# Patient Record
Sex: Female | Born: 1955
Health system: Southern US, Community
[De-identification: ages and names within clinical notes are randomized; demographics above are authoritative.]

## PROBLEM LIST (undated history)

## (undated) DIAGNOSIS — F1190 Opioid use, unspecified, uncomplicated: Secondary | ICD-10-CM

## (undated) DIAGNOSIS — M1990 Unspecified osteoarthritis, unspecified site: Secondary | ICD-10-CM

## (undated) DIAGNOSIS — M545 Low back pain, unspecified: Secondary | ICD-10-CM

## (undated) DIAGNOSIS — R51 Headache: Secondary | ICD-10-CM

## (undated) DIAGNOSIS — C73 Malignant neoplasm of thyroid gland: Secondary | ICD-10-CM

## (undated) DIAGNOSIS — J302 Other seasonal allergic rhinitis: Secondary | ICD-10-CM

## (undated) DIAGNOSIS — R102 Pelvic and perineal pain unspecified side: Secondary | ICD-10-CM

## (undated) DIAGNOSIS — T7840XA Allergy, unspecified, initial encounter: Secondary | ICD-10-CM

## (undated) DIAGNOSIS — R350 Frequency of micturition: Secondary | ICD-10-CM

## (undated) DIAGNOSIS — E1140 Type 2 diabetes mellitus with diabetic neuropathy, unspecified: Secondary | ICD-10-CM

## (undated) DIAGNOSIS — H269 Unspecified cataract: Secondary | ICD-10-CM

## (undated) DIAGNOSIS — G4700 Insomnia, unspecified: Secondary | ICD-10-CM

## (undated) DIAGNOSIS — R413 Other amnesia: Secondary | ICD-10-CM

## (undated) DIAGNOSIS — C539 Malignant neoplasm of cervix uteri, unspecified: Secondary | ICD-10-CM

## (undated) DIAGNOSIS — J189 Pneumonia, unspecified organism: Secondary | ICD-10-CM

## (undated) DIAGNOSIS — Z794 Long term (current) use of insulin: Secondary | ICD-10-CM

## (undated) DIAGNOSIS — K7460 Unspecified cirrhosis of liver: Secondary | ICD-10-CM

## (undated) DIAGNOSIS — M48061 Spinal stenosis, lumbar region without neurogenic claudication: Secondary | ICD-10-CM

## (undated) DIAGNOSIS — F329 Major depressive disorder, single episode, unspecified: Secondary | ICD-10-CM

## (undated) DIAGNOSIS — M419 Scoliosis, unspecified: Secondary | ICD-10-CM

## (undated) DIAGNOSIS — M353 Polymyalgia rheumatica: Secondary | ICD-10-CM

## (undated) DIAGNOSIS — A498 Other bacterial infections of unspecified site: Secondary | ICD-10-CM

## (undated) DIAGNOSIS — G4733 Obstructive sleep apnea (adult) (pediatric): Secondary | ICD-10-CM

## (undated) DIAGNOSIS — G894 Chronic pain syndrome: Secondary | ICD-10-CM

## (undated) DIAGNOSIS — I639 Cerebral infarction, unspecified: Secondary | ICD-10-CM

## (undated) DIAGNOSIS — E785 Hyperlipidemia, unspecified: Secondary | ICD-10-CM

## (undated) DIAGNOSIS — R3915 Urgency of urination: Secondary | ICD-10-CM

## (undated) DIAGNOSIS — IMO0001 Reserved for inherently not codable concepts without codable children: Secondary | ICD-10-CM

## (undated) DIAGNOSIS — M797 Fibromyalgia: Secondary | ICD-10-CM

## (undated) DIAGNOSIS — R519 Headache, unspecified: Secondary | ICD-10-CM

## (undated) DIAGNOSIS — Z8673 Personal history of transient ischemic attack (TIA), and cerebral infarction without residual deficits: Secondary | ICD-10-CM

## (undated) DIAGNOSIS — R0902 Hypoxemia: Secondary | ICD-10-CM

## (undated) DIAGNOSIS — E119 Type 2 diabetes mellitus without complications: Secondary | ICD-10-CM

## (undated) DIAGNOSIS — H3530 Unspecified macular degeneration: Secondary | ICD-10-CM

## (undated) DIAGNOSIS — R351 Nocturia: Secondary | ICD-10-CM

## (undated) DIAGNOSIS — J449 Chronic obstructive pulmonary disease, unspecified: Secondary | ICD-10-CM

## (undated) DIAGNOSIS — F32A Depression, unspecified: Secondary | ICD-10-CM

## (undated) DIAGNOSIS — E039 Hypothyroidism, unspecified: Secondary | ICD-10-CM

## (undated) DIAGNOSIS — K760 Fatty (change of) liver, not elsewhere classified: Secondary | ICD-10-CM

## (undated) DIAGNOSIS — N179 Acute kidney failure, unspecified: Secondary | ICD-10-CM

## (undated) DIAGNOSIS — G8929 Other chronic pain: Secondary | ICD-10-CM

## (undated) DIAGNOSIS — F419 Anxiety disorder, unspecified: Secondary | ICD-10-CM

## (undated) DIAGNOSIS — I10 Essential (primary) hypertension: Secondary | ICD-10-CM

## (undated) HISTORY — PX: COLONOSCOPY: SHX174

## (undated) HISTORY — DX: Cerebral infarction, unspecified: I63.9

## (undated) HISTORY — PX: CARDIOVASCULAR STRESS TEST: SHX262

## (undated) HISTORY — DX: Type 2 diabetes mellitus with diabetic neuropathy, unspecified: E11.40

## (undated) HISTORY — DX: Malignant neoplasm of thyroid gland: C73

## (undated) HISTORY — DX: Depression, unspecified: F32.A

## (undated) HISTORY — DX: Unspecified cataract: H26.9

## (undated) HISTORY — DX: Unspecified osteoarthritis, unspecified site: M19.90

## (undated) HISTORY — DX: Allergy, unspecified, initial encounter: T78.40XA

## (undated) HISTORY — DX: Fatty (change of) liver, not elsewhere classified: K76.0

## (undated) HISTORY — DX: Hypoxemia: R09.02

## (undated) HISTORY — DX: Polymyalgia rheumatica: M35.3

## (undated) HISTORY — DX: Unspecified cirrhosis of liver: K74.60

## (undated) HISTORY — PX: REVISION TOTAL KNEE ARTHROPLASTY: SUR1280

## (undated) HISTORY — DX: Scoliosis, unspecified: M41.9

## (undated) HISTORY — DX: Other amnesia: R41.3

## (undated) HISTORY — DX: Anxiety disorder, unspecified: F41.9

## (undated) HISTORY — DX: Chronic obstructive pulmonary disease, unspecified: J44.9

## (undated) HISTORY — DX: Insomnia, unspecified: G47.00

## (undated) HISTORY — DX: Unspecified macular degeneration: H35.30

## (undated) HISTORY — DX: Major depressive disorder, single episode, unspecified: F32.9

---
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
## 1980-08-26 HISTORY — PX: APPENDECTOMY: SHX54

## 1981-08-26 HISTORY — PX: TUBAL LIGATION: SHX77

## 1999-05-15 ENCOUNTER — Other Ambulatory Visit: Admission: RE | Admit: 1999-05-15 | Discharge: 1999-05-15 | Payer: Self-pay | Admitting: Obstetrics & Gynecology

## 1999-05-22 ENCOUNTER — Encounter: Payer: Self-pay | Admitting: Obstetrics & Gynecology

## 1999-05-22 ENCOUNTER — Ambulatory Visit (HOSPITAL_COMMUNITY): Admission: RE | Admit: 1999-05-22 | Discharge: 1999-05-22 | Payer: Self-pay | Admitting: Obstetrics & Gynecology

## 1999-05-23 ENCOUNTER — Ambulatory Visit (HOSPITAL_COMMUNITY): Admission: RE | Admit: 1999-05-23 | Discharge: 1999-05-23 | Payer: Self-pay | Admitting: Obstetrics & Gynecology

## 1999-05-23 ENCOUNTER — Encounter: Payer: Self-pay | Admitting: Obstetrics & Gynecology

## 1999-05-25 ENCOUNTER — Encounter: Payer: Self-pay | Admitting: Obstetrics & Gynecology

## 1999-05-25 ENCOUNTER — Ambulatory Visit (HOSPITAL_COMMUNITY): Admission: RE | Admit: 1999-05-25 | Discharge: 1999-05-25 | Payer: Self-pay | Admitting: Obstetrics & Gynecology

## 1999-06-11 ENCOUNTER — Encounter (INDEPENDENT_AMBULATORY_CARE_PROVIDER_SITE_OTHER): Payer: Self-pay

## 1999-06-11 ENCOUNTER — Other Ambulatory Visit: Admission: RE | Admit: 1999-06-11 | Discharge: 1999-06-11 | Payer: Self-pay | Admitting: Obstetrics & Gynecology

## 1999-07-20 ENCOUNTER — Encounter: Payer: Self-pay | Admitting: Emergency Medicine

## 1999-07-20 ENCOUNTER — Inpatient Hospital Stay (HOSPITAL_COMMUNITY): Admission: EM | Admit: 1999-07-20 | Discharge: 1999-07-21 | Payer: Self-pay | Admitting: Emergency Medicine

## 1999-11-28 ENCOUNTER — Emergency Department (HOSPITAL_COMMUNITY): Admission: EM | Admit: 1999-11-28 | Discharge: 1999-11-28 | Payer: Self-pay | Admitting: Emergency Medicine

## 1999-11-30 ENCOUNTER — Emergency Department (HOSPITAL_COMMUNITY): Admission: EM | Admit: 1999-11-30 | Discharge: 1999-11-30 | Payer: Self-pay | Admitting: *Deleted

## 2000-07-26 ENCOUNTER — Encounter: Payer: Self-pay | Admitting: Emergency Medicine

## 2000-07-26 ENCOUNTER — Emergency Department (HOSPITAL_COMMUNITY): Admission: EM | Admit: 2000-07-26 | Discharge: 2000-07-26 | Payer: Self-pay | Admitting: Emergency Medicine

## 2001-05-04 ENCOUNTER — Encounter (INDEPENDENT_AMBULATORY_CARE_PROVIDER_SITE_OTHER): Payer: Self-pay | Admitting: *Deleted

## 2001-05-04 ENCOUNTER — Encounter: Admission: RE | Admit: 2001-05-04 | Discharge: 2001-05-04 | Payer: Self-pay | Admitting: Family Medicine

## 2002-05-07 ENCOUNTER — Emergency Department (HOSPITAL_COMMUNITY): Admission: EM | Admit: 2002-05-07 | Discharge: 2002-05-07 | Payer: Self-pay | Admitting: *Deleted

## 2002-05-09 ENCOUNTER — Ambulatory Visit (HOSPITAL_COMMUNITY): Admission: RE | Admit: 2002-05-09 | Discharge: 2002-05-09 | Payer: Self-pay | Admitting: *Deleted

## 2002-05-09 ENCOUNTER — Encounter: Payer: Self-pay | Admitting: *Deleted

## 2002-05-27 ENCOUNTER — Emergency Department (HOSPITAL_COMMUNITY): Admission: EM | Admit: 2002-05-27 | Discharge: 2002-05-27 | Payer: Self-pay | Admitting: *Deleted

## 2002-06-04 ENCOUNTER — Encounter: Payer: Self-pay | Admitting: General Surgery

## 2002-06-10 ENCOUNTER — Encounter: Payer: Self-pay | Admitting: General Surgery

## 2002-06-10 ENCOUNTER — Encounter (INDEPENDENT_AMBULATORY_CARE_PROVIDER_SITE_OTHER): Payer: Self-pay | Admitting: Specialist

## 2002-06-10 ENCOUNTER — Observation Stay (HOSPITAL_COMMUNITY): Admission: RE | Admit: 2002-06-10 | Discharge: 2002-06-11 | Payer: Self-pay | Admitting: General Surgery

## 2002-06-10 HISTORY — PX: LAPAROSCOPIC CHOLECYSTECTOMY: SUR755

## 2002-08-02 ENCOUNTER — Ambulatory Visit (HOSPITAL_COMMUNITY): Admission: RE | Admit: 2002-08-02 | Discharge: 2002-08-02 | Payer: Self-pay | Admitting: Internal Medicine

## 2002-08-02 ENCOUNTER — Encounter: Payer: Self-pay | Admitting: Internal Medicine

## 2002-08-24 ENCOUNTER — Encounter: Admission: RE | Admit: 2002-08-24 | Discharge: 2002-11-22 | Payer: Self-pay | Admitting: Internal Medicine

## 2002-08-27 ENCOUNTER — Encounter: Payer: Self-pay | Admitting: Orthopedic Surgery

## 2002-08-27 ENCOUNTER — Encounter: Admission: RE | Admit: 2002-08-27 | Discharge: 2002-08-27 | Payer: Self-pay | Admitting: Internal Medicine

## 2002-08-27 ENCOUNTER — Encounter: Admission: RE | Admit: 2002-08-27 | Discharge: 2002-08-27 | Payer: Self-pay | Admitting: Orthopedic Surgery

## 2002-08-27 ENCOUNTER — Encounter: Payer: Self-pay | Admitting: Internal Medicine

## 2002-09-08 ENCOUNTER — Encounter: Payer: Self-pay | Admitting: Gastroenterology

## 2002-09-08 ENCOUNTER — Ambulatory Visit (HOSPITAL_COMMUNITY): Admission: RE | Admit: 2002-09-08 | Discharge: 2002-09-08 | Payer: Self-pay | Admitting: Gastroenterology

## 2002-09-08 DIAGNOSIS — K219 Gastro-esophageal reflux disease without esophagitis: Secondary | ICD-10-CM | POA: Insufficient documentation

## 2002-11-15 ENCOUNTER — Ambulatory Visit (HOSPITAL_COMMUNITY): Admission: RE | Admit: 2002-11-15 | Discharge: 2002-11-15 | Payer: Self-pay | Admitting: Cardiology

## 2002-11-15 ENCOUNTER — Encounter: Payer: Self-pay | Admitting: Cardiology

## 2002-11-16 ENCOUNTER — Ambulatory Visit: Admission: RE | Admit: 2002-11-16 | Discharge: 2002-11-16 | Payer: Self-pay | Admitting: Internal Medicine

## 2002-11-16 ENCOUNTER — Encounter: Payer: Self-pay | Admitting: Internal Medicine

## 2003-03-09 ENCOUNTER — Ambulatory Visit (HOSPITAL_COMMUNITY): Admission: RE | Admit: 2003-03-09 | Discharge: 2003-03-09 | Payer: Self-pay | Admitting: Internal Medicine

## 2003-03-28 ENCOUNTER — Encounter: Admission: RE | Admit: 2003-03-28 | Discharge: 2003-06-26 | Payer: Self-pay | Admitting: Surgery

## 2003-03-30 ENCOUNTER — Encounter: Payer: Self-pay | Admitting: Surgery

## 2003-03-30 ENCOUNTER — Encounter: Admission: RE | Admit: 2003-03-30 | Discharge: 2003-03-30 | Payer: Self-pay | Admitting: Surgery

## 2003-04-01 ENCOUNTER — Other Ambulatory Visit: Admission: RE | Admit: 2003-04-01 | Discharge: 2003-04-01 | Payer: Self-pay | Admitting: Obstetrics and Gynecology

## 2003-05-06 ENCOUNTER — Encounter: Payer: Self-pay | Admitting: Emergency Medicine

## 2003-05-06 ENCOUNTER — Emergency Department (HOSPITAL_COMMUNITY): Admission: EM | Admit: 2003-05-06 | Discharge: 2003-05-06 | Payer: Self-pay | Admitting: Emergency Medicine

## 2003-05-16 ENCOUNTER — Ambulatory Visit (HOSPITAL_COMMUNITY): Admission: RE | Admit: 2003-05-16 | Discharge: 2003-05-16 | Payer: Self-pay | Admitting: Obstetrics and Gynecology

## 2003-05-16 ENCOUNTER — Encounter (INDEPENDENT_AMBULATORY_CARE_PROVIDER_SITE_OTHER): Payer: Self-pay | Admitting: Specialist

## 2003-05-16 HISTORY — PX: CRYOABLATION: SHX1415

## 2003-09-07 ENCOUNTER — Emergency Department (HOSPITAL_COMMUNITY): Admission: AD | Admit: 2003-09-07 | Discharge: 2003-09-07 | Payer: Self-pay | Admitting: Family Medicine

## 2003-11-09 ENCOUNTER — Encounter: Admission: RE | Admit: 2003-11-09 | Discharge: 2003-11-09 | Payer: Self-pay | Admitting: Obstetrics and Gynecology

## 2004-01-30 ENCOUNTER — Emergency Department (HOSPITAL_COMMUNITY): Admission: EM | Admit: 2004-01-30 | Discharge: 2004-01-31 | Payer: Self-pay | Admitting: Emergency Medicine

## 2004-05-08 ENCOUNTER — Emergency Department (HOSPITAL_COMMUNITY): Admission: EM | Admit: 2004-05-08 | Discharge: 2004-05-08 | Payer: Self-pay | Admitting: Emergency Medicine

## 2004-08-25 ENCOUNTER — Emergency Department (HOSPITAL_COMMUNITY): Admission: EM | Admit: 2004-08-25 | Discharge: 2004-08-25 | Payer: Self-pay | Admitting: Emergency Medicine

## 2004-09-04 ENCOUNTER — Inpatient Hospital Stay (HOSPITAL_BASED_OUTPATIENT_CLINIC_OR_DEPARTMENT_OTHER): Admission: RE | Admit: 2004-09-04 | Discharge: 2004-09-04 | Payer: Self-pay | Admitting: Cardiology

## 2004-09-04 HISTORY — PX: CARDIAC CATHETERIZATION: SHX172

## 2004-12-28 ENCOUNTER — Encounter (INDEPENDENT_AMBULATORY_CARE_PROVIDER_SITE_OTHER): Payer: Self-pay | Admitting: *Deleted

## 2004-12-28 ENCOUNTER — Encounter: Admission: RE | Admit: 2004-12-28 | Discharge: 2004-12-28 | Payer: Self-pay | Admitting: Internal Medicine

## 2005-01-25 ENCOUNTER — Ambulatory Visit (HOSPITAL_COMMUNITY): Admission: RE | Admit: 2005-01-25 | Discharge: 2005-01-25 | Payer: Self-pay | Admitting: General Surgery

## 2005-02-28 ENCOUNTER — Encounter (INDEPENDENT_AMBULATORY_CARE_PROVIDER_SITE_OTHER): Payer: Self-pay | Admitting: *Deleted

## 2005-02-28 ENCOUNTER — Encounter: Admission: RE | Admit: 2005-02-28 | Discharge: 2005-02-28 | Payer: Self-pay | Admitting: General Surgery

## 2005-02-28 ENCOUNTER — Ambulatory Visit (HOSPITAL_COMMUNITY): Admission: RE | Admit: 2005-02-28 | Discharge: 2005-02-28 | Payer: Self-pay | Admitting: General Surgery

## 2005-02-28 HISTORY — PX: BREAST EXCISIONAL BIOPSY: SUR124

## 2005-04-04 ENCOUNTER — Ambulatory Visit (HOSPITAL_BASED_OUTPATIENT_CLINIC_OR_DEPARTMENT_OTHER): Admission: RE | Admit: 2005-04-04 | Discharge: 2005-04-04 | Payer: Self-pay | Admitting: Surgery

## 2005-04-07 ENCOUNTER — Ambulatory Visit: Payer: Self-pay | Admitting: Internal Medicine

## 2005-04-08 ENCOUNTER — Encounter: Admission: RE | Admit: 2005-04-08 | Discharge: 2005-07-07 | Payer: Self-pay | Admitting: Surgery

## 2005-04-09 ENCOUNTER — Ambulatory Visit: Payer: Self-pay | Admitting: Internal Medicine

## 2005-08-02 ENCOUNTER — Emergency Department (HOSPITAL_COMMUNITY): Admission: EM | Admit: 2005-08-02 | Discharge: 2005-08-03 | Payer: Self-pay | Admitting: Emergency Medicine

## 2005-08-30 ENCOUNTER — Encounter: Admission: RE | Admit: 2005-08-30 | Discharge: 2005-08-30 | Payer: Self-pay | Admitting: Internal Medicine

## 2005-09-05 ENCOUNTER — Ambulatory Visit (HOSPITAL_COMMUNITY): Admission: RE | Admit: 2005-09-05 | Discharge: 2005-09-05 | Payer: Self-pay | Admitting: Internal Medicine

## 2005-09-11 ENCOUNTER — Other Ambulatory Visit: Admission: RE | Admit: 2005-09-11 | Discharge: 2005-09-11 | Payer: Self-pay | Admitting: Diagnostic Radiology

## 2005-09-11 ENCOUNTER — Encounter: Admission: RE | Admit: 2005-09-11 | Discharge: 2005-09-11 | Payer: Self-pay | Admitting: Internal Medicine

## 2005-09-11 ENCOUNTER — Encounter (INDEPENDENT_AMBULATORY_CARE_PROVIDER_SITE_OTHER): Payer: Self-pay | Admitting: *Deleted

## 2005-11-22 ENCOUNTER — Ambulatory Visit (HOSPITAL_COMMUNITY): Admission: RE | Admit: 2005-11-22 | Discharge: 2005-11-23 | Payer: Self-pay | Admitting: General Surgery

## 2005-11-22 ENCOUNTER — Encounter (INDEPENDENT_AMBULATORY_CARE_PROVIDER_SITE_OTHER): Payer: Self-pay | Admitting: *Deleted

## 2005-11-22 HISTORY — PX: TOTAL THYROIDECTOMY: SHX2547

## 2006-01-04 ENCOUNTER — Encounter: Admission: RE | Admit: 2006-01-04 | Discharge: 2006-01-04 | Payer: Self-pay | Admitting: Family Medicine

## 2006-01-14 ENCOUNTER — Encounter: Admission: RE | Admit: 2006-01-14 | Discharge: 2006-01-14 | Payer: Self-pay | Admitting: Surgery

## 2006-02-25 ENCOUNTER — Ambulatory Visit (HOSPITAL_COMMUNITY): Admission: RE | Admit: 2006-02-25 | Discharge: 2006-02-26 | Payer: Self-pay | Admitting: Surgery

## 2006-02-25 ENCOUNTER — Encounter (INDEPENDENT_AMBULATORY_CARE_PROVIDER_SITE_OTHER): Payer: Self-pay | Admitting: *Deleted

## 2006-03-01 HISTORY — PX: LAPAROSCOPIC GASTRIC BANDING: SHX1100

## 2006-03-14 ENCOUNTER — Encounter: Admission: RE | Admit: 2006-03-14 | Discharge: 2006-03-14 | Payer: Self-pay | Admitting: Surgery

## 2006-03-20 ENCOUNTER — Encounter: Payer: Self-pay | Admitting: Vascular Surgery

## 2006-03-20 ENCOUNTER — Emergency Department (HOSPITAL_COMMUNITY): Admission: EM | Admit: 2006-03-20 | Discharge: 2006-03-20 | Payer: Self-pay | Admitting: Emergency Medicine

## 2006-03-26 ENCOUNTER — Emergency Department (HOSPITAL_COMMUNITY): Admission: EM | Admit: 2006-03-26 | Discharge: 2006-03-27 | Payer: Self-pay | Admitting: Emergency Medicine

## 2006-03-29 HISTORY — PX: KNEE ARTHROSCOPY W/ DEBRIDEMENT: SHX1867

## 2006-04-08 ENCOUNTER — Ambulatory Visit (HOSPITAL_COMMUNITY): Admission: RE | Admit: 2006-04-08 | Discharge: 2006-04-08 | Payer: Self-pay | Admitting: Orthopaedic Surgery

## 2006-07-09 ENCOUNTER — Encounter: Admission: RE | Admit: 2006-07-09 | Discharge: 2006-07-09 | Payer: Self-pay | Admitting: Family Medicine

## 2006-07-10 ENCOUNTER — Encounter: Admission: RE | Admit: 2006-07-10 | Discharge: 2006-07-10 | Payer: Self-pay | Admitting: Family Medicine

## 2006-08-01 ENCOUNTER — Encounter: Admission: RE | Admit: 2006-08-01 | Discharge: 2006-08-01 | Payer: Self-pay | Admitting: Family Medicine

## 2006-08-13 ENCOUNTER — Encounter: Admission: RE | Admit: 2006-08-13 | Discharge: 2006-08-13 | Payer: Self-pay | Admitting: Internal Medicine

## 2006-09-16 ENCOUNTER — Encounter: Admission: RE | Admit: 2006-09-16 | Discharge: 2006-09-16 | Payer: Self-pay | Admitting: Family Medicine

## 2006-10-05 ENCOUNTER — Emergency Department (HOSPITAL_COMMUNITY): Admission: EM | Admit: 2006-10-05 | Discharge: 2006-10-05 | Payer: Self-pay | Admitting: Family Medicine

## 2006-12-19 ENCOUNTER — Encounter: Admission: RE | Admit: 2006-12-19 | Discharge: 2006-12-19 | Payer: Self-pay | Admitting: Family Medicine

## 2006-12-23 ENCOUNTER — Encounter: Admission: RE | Admit: 2006-12-23 | Discharge: 2006-12-23 | Payer: Self-pay | Admitting: Endocrinology

## 2006-12-30 ENCOUNTER — Encounter: Admission: RE | Admit: 2006-12-30 | Discharge: 2006-12-30 | Payer: Self-pay | Admitting: Family Medicine

## 2007-01-15 ENCOUNTER — Encounter: Admission: RE | Admit: 2007-01-15 | Discharge: 2007-01-15 | Payer: Self-pay | Admitting: Radiology

## 2007-01-23 ENCOUNTER — Inpatient Hospital Stay (HOSPITAL_COMMUNITY): Admission: RE | Admit: 2007-01-23 | Discharge: 2007-01-27 | Payer: Self-pay | Admitting: Orthopaedic Surgery

## 2007-01-23 HISTORY — PX: TOTAL KNEE ARTHROPLASTY: SHX125

## 2007-01-27 ENCOUNTER — Ambulatory Visit: Payer: Self-pay | Admitting: Vascular Surgery

## 2007-02-23 ENCOUNTER — Encounter: Admission: RE | Admit: 2007-02-23 | Discharge: 2007-02-23 | Payer: Self-pay | Admitting: Family Medicine

## 2007-03-04 ENCOUNTER — Encounter: Admission: RE | Admit: 2007-03-04 | Discharge: 2007-03-04 | Payer: Self-pay | Admitting: Family Medicine

## 2007-05-05 ENCOUNTER — Encounter: Admission: RE | Admit: 2007-05-05 | Discharge: 2007-05-05 | Payer: Self-pay | Admitting: Family Medicine

## 2007-05-11 ENCOUNTER — Encounter
Admission: RE | Admit: 2007-05-11 | Discharge: 2007-08-09 | Payer: Self-pay | Admitting: Physical Medicine & Rehabilitation

## 2007-05-12 ENCOUNTER — Ambulatory Visit: Payer: Self-pay | Admitting: Physical Medicine & Rehabilitation

## 2007-05-19 ENCOUNTER — Encounter
Admission: RE | Admit: 2007-05-19 | Discharge: 2007-08-17 | Payer: Self-pay | Admitting: Physical Medicine & Rehabilitation

## 2007-05-26 ENCOUNTER — Encounter
Admission: RE | Admit: 2007-05-26 | Discharge: 2007-05-26 | Payer: Self-pay | Admitting: Physical Medicine & Rehabilitation

## 2007-07-17 ENCOUNTER — Ambulatory Visit: Payer: Self-pay | Admitting: Psychology

## 2007-07-17 ENCOUNTER — Ambulatory Visit: Payer: Self-pay | Admitting: Physical Medicine & Rehabilitation

## 2007-07-21 ENCOUNTER — Ambulatory Visit: Payer: Self-pay | Admitting: Gastroenterology

## 2007-07-21 LAB — CONVERTED CEMR LAB
Amylase: 27 units/L (ref 27–131)
Ferritin: 54.3 ng/mL (ref 10.0–291.0)
INR: 0.9 (ref 0.8–1.0)
Lipase: 17 units/L (ref 11.0–59.0)
Saturation Ratios: 10.1 % — ABNORMAL LOW (ref 20.0–50.0)

## 2007-07-24 ENCOUNTER — Ambulatory Visit: Payer: Self-pay | Admitting: Internal Medicine

## 2007-07-31 ENCOUNTER — Emergency Department (HOSPITAL_COMMUNITY): Admission: EM | Admit: 2007-07-31 | Discharge: 2007-07-31 | Payer: Self-pay | Admitting: Emergency Medicine

## 2007-07-31 ENCOUNTER — Ambulatory Visit: Payer: Self-pay | Admitting: Gastroenterology

## 2007-08-05 ENCOUNTER — Ambulatory Visit: Payer: Self-pay | Admitting: Gastroenterology

## 2007-08-07 ENCOUNTER — Encounter
Admission: RE | Admit: 2007-08-07 | Discharge: 2007-08-10 | Payer: Self-pay | Admitting: Physical Medicine & Rehabilitation

## 2007-08-12 ENCOUNTER — Ambulatory Visit: Payer: Self-pay | Admitting: Gastroenterology

## 2007-08-18 ENCOUNTER — Ambulatory Visit: Payer: Self-pay | Admitting: Gastroenterology

## 2007-08-19 ENCOUNTER — Ambulatory Visit (HOSPITAL_COMMUNITY): Admission: RE | Admit: 2007-08-19 | Discharge: 2007-08-19 | Payer: Self-pay | Admitting: Obstetrics and Gynecology

## 2007-08-19 ENCOUNTER — Encounter (INDEPENDENT_AMBULATORY_CARE_PROVIDER_SITE_OTHER): Payer: Self-pay | Admitting: Obstetrics and Gynecology

## 2007-08-19 HISTORY — PX: HYSTEROSCOPY WITH D & C: SHX1775

## 2007-08-25 DIAGNOSIS — F411 Generalized anxiety disorder: Secondary | ICD-10-CM | POA: Insufficient documentation

## 2007-08-25 DIAGNOSIS — R609 Edema, unspecified: Secondary | ICD-10-CM | POA: Insufficient documentation

## 2007-08-25 DIAGNOSIS — F32A Depression, unspecified: Secondary | ICD-10-CM | POA: Insufficient documentation

## 2007-08-25 DIAGNOSIS — I10 Essential (primary) hypertension: Secondary | ICD-10-CM | POA: Insufficient documentation

## 2007-08-25 DIAGNOSIS — M353 Polymyalgia rheumatica: Secondary | ICD-10-CM | POA: Insufficient documentation

## 2007-08-25 DIAGNOSIS — M79609 Pain in unspecified limb: Secondary | ICD-10-CM | POA: Insufficient documentation

## 2007-08-25 DIAGNOSIS — F329 Major depressive disorder, single episode, unspecified: Secondary | ICD-10-CM

## 2007-08-25 DIAGNOSIS — E079 Disorder of thyroid, unspecified: Secondary | ICD-10-CM | POA: Insufficient documentation

## 2007-08-25 DIAGNOSIS — J45909 Unspecified asthma, uncomplicated: Secondary | ICD-10-CM | POA: Insufficient documentation

## 2007-08-25 DIAGNOSIS — E7800 Pure hypercholesterolemia, unspecified: Secondary | ICD-10-CM | POA: Insufficient documentation

## 2007-08-25 DIAGNOSIS — K5900 Constipation, unspecified: Secondary | ICD-10-CM | POA: Insufficient documentation

## 2007-08-25 DIAGNOSIS — E1159 Type 2 diabetes mellitus with other circulatory complications: Secondary | ICD-10-CM | POA: Insufficient documentation

## 2007-08-25 DIAGNOSIS — G4730 Sleep apnea, unspecified: Secondary | ICD-10-CM | POA: Insufficient documentation

## 2007-09-04 ENCOUNTER — Encounter
Admission: RE | Admit: 2007-09-04 | Discharge: 2007-12-03 | Payer: Self-pay | Admitting: Physical Medicine & Rehabilitation

## 2007-09-04 ENCOUNTER — Ambulatory Visit: Payer: Self-pay | Admitting: Physical Medicine & Rehabilitation

## 2007-09-07 ENCOUNTER — Ambulatory Visit: Payer: Self-pay | Admitting: Internal Medicine

## 2007-09-07 DIAGNOSIS — F17200 Nicotine dependence, unspecified, uncomplicated: Secondary | ICD-10-CM | POA: Insufficient documentation

## 2007-09-14 ENCOUNTER — Encounter: Payer: Self-pay | Admitting: Internal Medicine

## 2007-09-14 DIAGNOSIS — E785 Hyperlipidemia, unspecified: Secondary | ICD-10-CM | POA: Insufficient documentation

## 2007-09-14 DIAGNOSIS — E139 Other specified diabetes mellitus without complications: Secondary | ICD-10-CM | POA: Insufficient documentation

## 2007-09-16 ENCOUNTER — Telehealth (INDEPENDENT_AMBULATORY_CARE_PROVIDER_SITE_OTHER): Payer: Self-pay | Admitting: *Deleted

## 2007-09-18 ENCOUNTER — Ambulatory Visit: Payer: Self-pay | Admitting: Gastroenterology

## 2007-10-07 ENCOUNTER — Ambulatory Visit: Payer: Self-pay | Admitting: Physical Medicine & Rehabilitation

## 2007-10-12 ENCOUNTER — Ambulatory Visit: Payer: Self-pay | Admitting: Gastroenterology

## 2007-10-14 DIAGNOSIS — G5600 Carpal tunnel syndrome, unspecified upper limb: Secondary | ICD-10-CM | POA: Insufficient documentation

## 2007-10-14 DIAGNOSIS — G894 Chronic pain syndrome: Secondary | ICD-10-CM | POA: Insufficient documentation

## 2007-10-14 DIAGNOSIS — M129 Arthropathy, unspecified: Secondary | ICD-10-CM | POA: Insufficient documentation

## 2007-10-14 DIAGNOSIS — E669 Obesity, unspecified: Secondary | ICD-10-CM | POA: Insufficient documentation

## 2007-10-14 DIAGNOSIS — E039 Hypothyroidism, unspecified: Secondary | ICD-10-CM | POA: Insufficient documentation

## 2007-10-14 DIAGNOSIS — Z8719 Personal history of other diseases of the digestive system: Secondary | ICD-10-CM | POA: Insufficient documentation

## 2007-10-14 DIAGNOSIS — G609 Hereditary and idiopathic neuropathy, unspecified: Secondary | ICD-10-CM | POA: Insufficient documentation

## 2007-10-14 DIAGNOSIS — J309 Allergic rhinitis, unspecified: Secondary | ICD-10-CM | POA: Insufficient documentation

## 2007-10-19 ENCOUNTER — Ambulatory Visit: Payer: Self-pay | Admitting: Internal Medicine

## 2007-10-21 ENCOUNTER — Ambulatory Visit: Payer: Self-pay | Admitting: Physical Medicine & Rehabilitation

## 2007-10-22 DIAGNOSIS — G4700 Insomnia, unspecified: Secondary | ICD-10-CM | POA: Insufficient documentation

## 2007-10-22 DIAGNOSIS — M545 Low back pain, unspecified: Secondary | ICD-10-CM | POA: Insufficient documentation

## 2007-10-22 DIAGNOSIS — E559 Vitamin D deficiency, unspecified: Secondary | ICD-10-CM | POA: Insufficient documentation

## 2007-11-17 ENCOUNTER — Encounter
Admission: RE | Admit: 2007-11-17 | Discharge: 2008-02-15 | Payer: Self-pay | Admitting: Physical Medicine & Rehabilitation

## 2007-11-20 ENCOUNTER — Ambulatory Visit: Payer: Self-pay | Admitting: Gastroenterology

## 2007-11-26 ENCOUNTER — Ambulatory Visit (HOSPITAL_COMMUNITY): Admission: RE | Admit: 2007-11-26 | Discharge: 2007-11-26 | Payer: Self-pay | Admitting: Gastroenterology

## 2007-11-26 ENCOUNTER — Encounter: Payer: Self-pay | Admitting: Gastroenterology

## 2007-12-17 ENCOUNTER — Ambulatory Visit: Payer: Self-pay | Admitting: Physical Medicine & Rehabilitation

## 2008-01-14 ENCOUNTER — Ambulatory Visit: Payer: Self-pay | Admitting: Physical Medicine & Rehabilitation

## 2008-01-21 ENCOUNTER — Emergency Department (HOSPITAL_COMMUNITY): Admission: EM | Admit: 2008-01-21 | Discharge: 2008-01-21 | Payer: Self-pay | Admitting: Family Medicine

## 2008-02-09 ENCOUNTER — Encounter
Admission: RE | Admit: 2008-02-09 | Discharge: 2008-03-10 | Payer: Self-pay | Admitting: Physical Medicine & Rehabilitation

## 2008-02-11 ENCOUNTER — Ambulatory Visit: Payer: Self-pay | Admitting: Physical Medicine & Rehabilitation

## 2008-02-23 ENCOUNTER — Emergency Department (HOSPITAL_COMMUNITY): Admission: EM | Admit: 2008-02-23 | Discharge: 2008-02-24 | Payer: Self-pay | Admitting: Emergency Medicine

## 2008-03-10 ENCOUNTER — Ambulatory Visit: Payer: Self-pay | Admitting: Physical Medicine & Rehabilitation

## 2008-06-03 ENCOUNTER — Ambulatory Visit (HOSPITAL_COMMUNITY): Admission: RE | Admit: 2008-06-03 | Discharge: 2008-06-03 | Payer: Self-pay | Admitting: Orthopaedic Surgery

## 2008-08-29 ENCOUNTER — Inpatient Hospital Stay (HOSPITAL_COMMUNITY): Admission: RE | Admit: 2008-08-29 | Discharge: 2008-08-31 | Payer: Self-pay | Admitting: Orthopaedic Surgery

## 2008-11-03 ENCOUNTER — Encounter: Admission: RE | Admit: 2008-11-03 | Discharge: 2008-11-03 | Payer: Self-pay | Admitting: Orthopaedic Surgery

## 2008-11-26 ENCOUNTER — Emergency Department (HOSPITAL_COMMUNITY): Admission: EM | Admit: 2008-11-26 | Discharge: 2008-11-26 | Payer: Self-pay | Admitting: Family Medicine

## 2008-12-07 ENCOUNTER — Ambulatory Visit (HOSPITAL_COMMUNITY): Admission: RE | Admit: 2008-12-07 | Discharge: 2008-12-07 | Payer: Self-pay | Admitting: Orthopaedic Surgery

## 2009-02-03 ENCOUNTER — Encounter: Admission: RE | Admit: 2009-02-03 | Discharge: 2009-02-03 | Payer: Self-pay | Admitting: Obstetrics and Gynecology

## 2009-02-08 ENCOUNTER — Encounter: Admission: RE | Admit: 2009-02-08 | Discharge: 2009-02-08 | Payer: Self-pay | Admitting: Obstetrics and Gynecology

## 2009-02-18 ENCOUNTER — Emergency Department (HOSPITAL_COMMUNITY): Admission: EM | Admit: 2009-02-18 | Discharge: 2009-02-18 | Payer: Self-pay | Admitting: Emergency Medicine

## 2009-07-24 ENCOUNTER — Emergency Department (HOSPITAL_COMMUNITY): Admission: EM | Admit: 2009-07-24 | Discharge: 2009-07-24 | Payer: Self-pay | Admitting: Emergency Medicine

## 2009-08-23 ENCOUNTER — Emergency Department (HOSPITAL_COMMUNITY): Admission: EM | Admit: 2009-08-23 | Discharge: 2009-08-23 | Payer: Self-pay | Admitting: Family Medicine

## 2009-09-03 ENCOUNTER — Encounter: Admission: RE | Admit: 2009-09-03 | Discharge: 2009-09-03 | Payer: Self-pay | Admitting: Hematology & Oncology

## 2009-09-08 ENCOUNTER — Encounter (HOSPITAL_COMMUNITY): Admission: RE | Admit: 2009-09-08 | Discharge: 2009-10-30 | Payer: Self-pay | Admitting: Family Medicine

## 2009-09-18 ENCOUNTER — Encounter (HOSPITAL_COMMUNITY): Admission: RE | Admit: 2009-09-18 | Discharge: 2009-12-01 | Payer: Self-pay | Admitting: Family Medicine

## 2010-03-15 ENCOUNTER — Emergency Department (HOSPITAL_COMMUNITY): Admission: EM | Admit: 2010-03-15 | Discharge: 2010-03-16 | Payer: Self-pay | Admitting: Emergency Medicine

## 2010-05-18 ENCOUNTER — Encounter: Admission: RE | Admit: 2010-05-18 | Discharge: 2010-05-18 | Payer: Self-pay | Admitting: Internal Medicine

## 2010-09-16 ENCOUNTER — Encounter: Payer: Self-pay | Admitting: Surgery

## 2010-09-16 ENCOUNTER — Encounter: Payer: Self-pay | Admitting: Orthopaedic Surgery

## 2010-09-16 ENCOUNTER — Encounter: Payer: Self-pay | Admitting: Family Medicine

## 2010-09-21 LAB — CBC
HCT: 42.7 % (ref 36.0–46.0)
MCHC: 31.6 g/dL (ref 30.0–36.0)
MCV: 82.6 fL (ref 78.0–100.0)
Platelets: 181 10*3/uL (ref 150–400)
RDW: 14.9 % (ref 11.5–15.5)

## 2010-09-21 LAB — BASIC METABOLIC PANEL
BUN: 13 mg/dL (ref 6–23)
Calcium: 9.5 mg/dL (ref 8.4–10.5)
GFR calc non Af Amer: >60 mL/min (ref >60)
Glucose, Bld: 97 mg/dL (ref 70–99)
Sodium: 137 mEq/L (ref 135–145)

## 2010-09-28 ENCOUNTER — Observation Stay (HOSPITAL_COMMUNITY)
Admission: RE | Admit: 2010-09-28 | Discharge: 2010-09-29 | Disposition: A | Discharge status: Home / Self Care | Payer: Medicare Other | Source: Ambulatory Visit | Attending: Obstetrics and Gynecology | Admitting: Obstetrics and Gynecology

## 2010-09-28 DIAGNOSIS — N393 Stress incontinence (female) (male): Principal | ICD-10-CM | POA: Insufficient documentation

## 2010-09-28 HISTORY — PX: OTHER SURGICAL HISTORY: SHX169

## 2010-09-29 LAB — CBC
HCT: 38 % (ref 36.0–46.0)
Hemoglobin: 11.7 g/dL — ABNORMAL LOW (ref 12.0–15.0)
MCH: 26.6 pg (ref 26.0–34.0)
MCHC: 30.8 g/dL (ref 30.0–36.0)
MCV: 86.4 fL (ref 78.0–100.0)

## 2010-10-08 NOTE — Op Note (Signed)
NAME:  Mary Acosta, Mary Acosta         ACCOUNT NO.:  [id number]  MEDICAL RECORD NO.:  [id number]           PATIENT TYPE:  O  LOCATION:  [facility]                          [facility]  PHYSICIAN:  Osborn Coho, M.D.   DATE OF BIRTH:  03/22/1956  DATE OF PROCEDURE:  09/28/2010 DATE OF DISCHARGE:                              OPERATIVE REPORT   PREOPERATIVE DIAGNOSIS:  Mixed urinary incontinence.  POSTOPERATIVE DIAGNOSIS:  Mixed urinary incontinence.  PROCEDURE: 1. TVT. 2. Cystoscopy.  ATTENDING DOCTOR:  Osborn Coho, M.D.  ASSISTANT:  Marquis Lunch. Adline Peals.  ANESTHESIA:  General.  FLUIDS:  1300 mL.  URINE OUTPUT:  150 mL.  ESTIMATED BLOOD LOSS:  Minimal.  COMPLICATIONS:  None.  PROCEDURE:  The patient was taken to the operating room after risks, benefits, and alternatives discussed with the patient.  The patient verbalized understanding, consent signed and witnessed.  The patient was placed under general anesthesia and prepped and draped in the normal sterile fashion in the dorsal lithotomy position.  A weighted speculum was placed in the patient's vagina and dilute Pitressin of 20 units of Pitressin in 100 mL normal saline was injected to the anterior wall for a total of approximately 10 mL.  An incision was made in the anterior vaginal wall at the level of the mid urethra for approximately 1 cm and the underlying tissue was dissected away bilaterally to the lower edge of the symphysis pubis bilaterally.  Attention was then turned to the mons pubis where approximately 2 fingerbreadths from the midline bilaterally 5-mm incision were made.  After draining the bladder completely, the 18-French Foley with the rigid urethral catheter guide was placed in the patient's urethra and deflected to the patient's right and the transabdominal guide was passed from the mons pubis out through the anterior vaginal wall incision through the space of Retzius without difficulty.  The same  was done on the contralateral side.  Cystoscopy was performed and no inadvertent bladder injury was noted.  After emptying the bladder once again and deflecting the rigid urethral catheter guide to the patient's right, the mesh was attached to the transabdominal guides and while deflecting the guide to the right, the mesh was elevated up through the incision on the anterior vaginal wall through the space of Retzius and out through the incision on the mons pubis.  The same was done on the contralateral side.  Cystoscopy was performed and once again no inadvertent bladder injury was noted.  The bladder was drained completely and the Foley was placed to gravity and a large Tresa Endo was placed between the midurethra and the mesh and the sheath was removed bilaterally.  The anterior vaginal wall incision was repaired with 2-0 Vicryl via one figure-of-eight stitch and the remainder were interrupted stitches.  Cystoscopy was performed once again and after administering indigo carmine, bilateral ureters were noted to efflux without difficulty.  The Foley was left to gravity.  The mesh was cut flush with the incisions on the mons pubis and were repaired with Dermabond.  Sponge, lap, and needle count was correct. The patient's vagina was packed with 2-inch packing soaked with estrogen cream.  Sponge, lap, and needle count was correct.  The patient tolerated the procedure well and was awaiting transfer to the recovery room in good condition.     Osborn Coho, M.D.     AR/MEDQ  D:  09/28/2010  T:  09/29/2010  Job:  [id number]  Electronically Signed by Osborn Coho M.D. on 10/08/2010 04:31:42 PM

## 2010-10-08 NOTE — H&P (Signed)
NAME:  Mary Acosta, Mary Acosta NO.:  [id number]  MEDICAL RECORD NO.:  [id number]         PATIENT TYPE:  WAMB  LOCATION:                                [facility]  PHYSICIAN:  Osborn Coho, M.D.   DATE OF BIRTH:  [birth year]  DATE OF ADMISSION:  09/28/2010 DATE OF DISCHARGE:                             HISTORY & PHYSICAL   HISTORY OF PRESENT ILLNESS:  [name] is a 55 year old married white female para 2-0-0-2 presenting for placement of tension-free vaginal tape and cystoscopy because of stress urinary incontinence.  For many years, the patient has had bladder issues to include overactive bladder, urge, and stress urinary incontinence.  The patient was diagnosed with detrusor instability in 2004 days as well as a small cystocele.  The patient has been given Toviaz 8 mg to manage her overactive bladder, however, she still reports leaking of urine whenever there is increased abdominal pressure.  The patient states that when she blows her nose, clears her throat, cough, sneezes, lift or occasional any sudden movement, she may leak urine from her bladder.  She denies any vaginitis symptoms, any nausea, vomiting, diarrhea, fever, pelvic pain, back pain, or hematuria.  A review of management options were given to patient (both medical and surgical), however, she desires to proceed with surgical management in the form of placement of tension- free vaginal tape.  OBSTETRIC HISTORY:  Gravida 2, para 2-0-0-2.  The patient had two spontaneous vaginal births.  GYNECOLOGIC HISTORY:  Menarche at 55 years old.  The patient is menopausal.  She has a history of human papillomavirus.  She had an abnormal Pap smear in 2004, for which she underwent a LEEP procedure. Pap smears have been normal since that time with her most recent being in March of 2010 (January 2012 Pap smear is pending).  PAST MEDICAL HISTORY:  Polymyalgia rheumatica, diabetes mellitus, hypertension,  postmenopausal bleeding, simple hyperplasia, asthma, thyroid cancer, fatty liver disease, scoliosis, gastroesophageal reflux disease, and chronic back pain.  PAST SURGICAL HISTORY:  In 1969, tonsillectomy; 1982, appendectomy; 1983, bilateral tubal ligation; 2004 hysteroscopy D and C with LEEP procedure and cryoablation of her cervix; 2006, cholecystectomy; 2005, thyroidectomy for thyroid cancer; 2007, lap band surgery; 2008, left knee replacement; 2007, left breast lump excision (benign); 2008, hysteroscopy, D and C; 2010, left knee surgery.  The patient denies any problems with anesthesia or history of blood transfusions.  FAMILY HISTORY:  Cardiovascular disease, skin cancer, diabetes, thyroid disease, depression and anxiety.  SOCIAL HISTORY:  The patient is disabled.  She is married.  HABITS:  She smokes one pack of cigarettes per day.  Denies any alcohol or illicit drug use.  CURRENT MEDICATIONS: 1. Fentanyl patch 75 mg. 2. Synthroid 125 mcg daily. 3. Nucynta 75 mg daily. 4. Phenergan 25 mg every 6 hours as needed for nausea. 5. Chantix Starter pack. 6. Toviaz 8 mg daily. 7. Flector patch twice daily as needed for knee pain.  She has no known drug allergies.  Denies any sensitivities to latex or peanuts or shellfish.  REVIEW OF SYSTEMS:  The patient does wear glasses.  She has chronic back pain.  She  has chronic left knee pain.  She has hirsute.  Denies any headaches, vision changes, lightheadedness, chest pain, shortness of breath, skin rashes, and except as is mentioned in history of present illness, the patient's review of systems is otherwise negative.  PHYSICAL EXAMINATION:  VITAL SIGNS:  Blood pressure 110/70, pulse is 72, respirations 16, temperature 98.3 degrees Fahrenheit orally, weight in (July 23, 2010) 205 pounds.  The patient declined weight at her preop visit, height 5 feet 9 inches tall.  Body mass index 30.3. NECK:  Supple without masses.  There is no  cervical adenopathy and patient's thyroid is surgically removed. HEART:  Regular rate and rhythm. LUNGS:  Clear. BACK:  No CVA tenderness. ABDOMEN:  No tenderness, guarding, rebound, or organomegaly.  No palpable masses. MUSCULOSKELETAL:  There is no clubbing, cyanosis, or edema.  The patient has multiple healed scars on her left knee. PELVIC:  EG/BUS is atrophic.  Vagina is atrophic with mild pelvic relaxation.  Cervix is nontender without lesions.  Uterus normal, size, shape, and consistency.  Adnexa no tenderness or masses.  IMPRESSION:  Stress urinary incontinence.  DISPOSITION:  A discussion was held with patient regarding indications for her procedure along with its risks which include, but are not limited to reaction to anesthesia, damage to adjacent organs, infection, bleeding, worsening of her symptoms, erosion of tension-free vaginal tape, and urinary retention.  The patient verbalized understanding of these risks and has consented to proceed with placement of tension-free vaginal tape at [facility] [place] on September 28, 2010, at 10:15 a.m.     Elmira J. Lowell Guitar, P.A.-C  ______________________________ Osborn Coho, M.D.    EJP/MEDQ  D:  09/24/2010  T:  09/25/2010  Job:  [id number]  Electronically Signed by Raylene Everts. on 09/26/2010 09:00:23 AM Electronically Signed by Osborn Coho M.D. on 10/08/2010 04:31:40 PM

## 2010-10-12 NOTE — Discharge Summary (Signed)
  NAME:  Mary Acosta, Mary Acosta         ACCOUNT NO.:  [id number]  MEDICAL RECORD NO.:  [id number]           PATIENT TYPE:  I  LOCATION:  9318                          [facility]  [facility]  PHYSICIAN:  Osborn Coho, M.D.   DATE OF BIRTH:  March 29, 1956  DATE OF ADMISSION:  09/28/2010 DATE OF DISCHARGE:  09/29/2010                              DISCHARGE SUMMARY   DISCHARGE DIAGNOSIS:  Mixed urinary incontinence.  OPERATION:  On the date of admission, the patient underwent placement of tension-free vaginal tape followed by cystoscopy, tolerating procedure well.  HISTORY OF PRESENT ILLNESS:  [name] is a 55 year old married white female para 2-0-0-2, presenting for placement of tension-free vaginal tape and cystoscopy because of stress urinary incontinence. Please see the patient's dictated history and physical examination for details.  PREOPERATIVE PHYSICAL EXAMINATION:  VITAL SIGNS:  Blood pressure is 110/70, pulse 72, respirations 16, temperature 98.6 degrees Fahrenheit orally, weight 205 pounds, height 5 feet 9 inches tall, body mass index 30.3.  GENERAL:  Within normal limits. PELVIC:  EG/BUS, vagina was atrophic with mild pelvic relaxation. Cervix was nontender without lesions.  Uterus appeared normal size, shape, and consistency.  Adnexa without tenderness or masses.  HOSPITAL COURSE:  On the date of admission, the patient underwent aforementioned procedures, tolerating them all well.  The patient's postoperative course was unremarkable with her resuming bowel and bladder function by postop day #2 and therefore deemed ready for discharge home.  The patient's discharge hemoglobin was 11.7.  Discharge hematocrit was 38.0 (preop hemoglobin/hematocrit were 13.5/42.7 respectively).  DISCHARGE MEDICATIONS:  The patient was directed to her home medication reconciliation form.  She was further prescribed Cipro 250 mg twice daily for 5 days.  The patient has her own pain medication  that she may administer as needed.  DISCHARGE INSTRUCTIONS AND FOLLOWUP:  The patient is scheduled for a postop visit with Dr. Su Hilt on November 09, 2010, at 2 o'clock p.m.  She was given a copy of the [facility] discharge instructions on bladder sling and was further advised to not lift anything greater than 15 pounds for 6 weeks.     Elmira J. Lowell Guitar, P.A.-C   ______________________________ Osborn Coho, M.D.    EJP/MEDQ  D:  10/08/2010  T:  10/09/2010  Job:  [id number]  Electronically Signed by Raylene Everts. on 10/10/2010 09:36:34 PM Electronically Signed by Osborn Coho M.D. on 10/12/2010 09:31:30 AM

## 2010-10-21 ENCOUNTER — Inpatient Hospital Stay (HOSPITAL_COMMUNITY)
Admission: AD | Admit: 2010-10-21 | Discharge: 2010-10-21 | Disposition: A | Discharge status: Home / Self Care | Payer: Medicare Other | Source: Ambulatory Visit | Attending: Obstetrics and Gynecology | Admitting: Obstetrics and Gynecology

## 2010-10-21 DIAGNOSIS — R32 Unspecified urinary incontinence: Secondary | ICD-10-CM | POA: Insufficient documentation

## 2010-10-21 LAB — URINALYSIS, ROUTINE W REFLEX MICROSCOPIC
Hgb urine dipstick: NEGATIVE
Ketones, ur: 15 mg/dL — AB
Protein, ur: NEGATIVE mg/dL
Urine Glucose, Fasting: NEGATIVE mg/dL

## 2010-10-22 ENCOUNTER — Emergency Department (HOSPITAL_COMMUNITY): Payer: Medicare Other

## 2010-10-22 ENCOUNTER — Emergency Department (HOSPITAL_COMMUNITY)
Admission: EM | Admit: 2010-10-22 | Discharge: 2010-10-23 | Disposition: A | Discharge status: Home / Self Care | Payer: Medicare Other | Attending: Emergency Medicine | Admitting: Emergency Medicine

## 2010-10-22 DIAGNOSIS — Z85850 Personal history of malignant neoplasm of thyroid: Secondary | ICD-10-CM | POA: Insufficient documentation

## 2010-10-22 DIAGNOSIS — Z79899 Other long term (current) drug therapy: Secondary | ICD-10-CM | POA: Insufficient documentation

## 2010-10-22 DIAGNOSIS — F411 Generalized anxiety disorder: Secondary | ICD-10-CM | POA: Insufficient documentation

## 2010-10-22 DIAGNOSIS — R29810 Facial weakness: Secondary | ICD-10-CM | POA: Insufficient documentation

## 2010-10-23 LAB — URINALYSIS, ROUTINE W REFLEX MICROSCOPIC
Nitrite: POSITIVE — AB
Protein, ur: 30 mg/dL — AB
Urobilinogen, UA: 1 mg/dL (ref 0.0–1.0)

## 2010-10-23 LAB — URINE MICROSCOPIC-ADD ON

## 2010-10-23 LAB — URINE CULTURE
Colony Count: NO GROWTH
Culture: NO GROWTH

## 2010-10-23 LAB — BASIC METABOLIC PANEL
Calcium: 9.5 mg/dL (ref 8.4–10.5)
Chloride: 102 mEq/L (ref 96–112)
Creatinine, Ser: 0.92 mg/dL (ref 0.4–1.2)
GFR calc Af Amer: >60 mL/min (ref >60)
GFR calc non Af Amer: >60 mL/min (ref >60)

## 2010-10-23 LAB — CBC
MCH: 27.9 pg (ref 26.0–34.0)
MCV: 82.7 fL (ref 78.0–100.0)
Platelets: 240 10*3/uL (ref 150–400)
RBC: 5.2 MIL/uL — ABNORMAL HIGH (ref 3.87–5.11)

## 2010-10-23 LAB — ETHANOL: Alcohol, Ethyl (B): <5 mg/dL (ref 0–10)

## 2010-10-23 LAB — DIFFERENTIAL
Eosinophils Absolute: 0 10*3/uL (ref 0.0–0.7)
Lymphs Abs: 2.9 10*3/uL (ref 0.7–4.0)
Monocytes Relative: 8 % (ref 3–12)
Neutrophils Relative %: 66 % (ref 43–77)

## 2010-10-23 LAB — RAPID URINE DRUG SCREEN, HOSP PERFORMED
Benzodiazepines: POSITIVE — AB
Cocaine: NOT DETECTED
Tetrahydrocannabinol: NOT DETECTED

## 2010-10-24 LAB — URINE CULTURE

## 2010-10-31 ENCOUNTER — Ambulatory Visit (HOSPITAL_BASED_OUTPATIENT_CLINIC_OR_DEPARTMENT_OTHER): Payer: Medicare Other | Attending: Family Medicine

## 2010-10-31 DIAGNOSIS — G4733 Obstructive sleep apnea (adult) (pediatric): Secondary | ICD-10-CM | POA: Insufficient documentation

## 2010-10-31 DIAGNOSIS — G4737 Central sleep apnea in conditions classified elsewhere: Secondary | ICD-10-CM | POA: Insufficient documentation

## 2010-11-01 ENCOUNTER — Other Ambulatory Visit: Payer: Self-pay | Admitting: Neurology

## 2010-11-01 DIAGNOSIS — R29810 Facial weakness: Secondary | ICD-10-CM

## 2010-11-01 DIAGNOSIS — E039 Hypothyroidism, unspecified: Secondary | ICD-10-CM

## 2010-11-01 DIAGNOSIS — Z8673 Personal history of transient ischemic attack (TIA), and cerebral infarction without residual deficits: Secondary | ICD-10-CM

## 2010-11-01 DIAGNOSIS — Z87891 Personal history of nicotine dependence: Secondary | ICD-10-CM

## 2010-11-01 HISTORY — DX: Personal history of transient ischemic attack (TIA), and cerebral infarction without residual deficits: Z86.73

## 2010-11-03 DIAGNOSIS — G4733 Obstructive sleep apnea (adult) (pediatric): Secondary | ICD-10-CM

## 2010-11-03 DIAGNOSIS — G4737 Central sleep apnea in conditions classified elsewhere: Secondary | ICD-10-CM

## 2010-11-04 ENCOUNTER — Ambulatory Visit
Admission: RE | Admit: 2010-11-04 | Discharge: 2010-11-04 | Disposition: A | Discharge status: Home / Self Care | Payer: Medicare Other | Source: Ambulatory Visit | Attending: Neurology | Admitting: Neurology

## 2010-11-04 DIAGNOSIS — Z87891 Personal history of nicotine dependence: Secondary | ICD-10-CM

## 2010-11-04 DIAGNOSIS — E039 Hypothyroidism, unspecified: Secondary | ICD-10-CM

## 2010-11-04 DIAGNOSIS — R29810 Facial weakness: Secondary | ICD-10-CM

## 2010-11-06 NOTE — Consult Note (Signed)
NAME:  Mary Acosta         ACCOUNT NO.:  [id number]  MEDICAL RECORD NO.:  [id number]           PATIENT TYPE:  I  LOCATION:  [facility]                         [facility]  PHYSICIAN:  Janine Limbo, M.D.DATE OF BIRTH:  1955-12-15  DATE OF CONSULTATION:  10/21/2010 DATE OF DISCHARGE:  10/21/2010                                CONSULTATION   HISTORY OF PRESENT ILLNESS:  [name] is a 55 year old female, para 2- 0-0-2, who presents to the Maternity Admissions  area, complaining of leakage of fluid.  The patient has been followed at the [facility] of [facility]. On September 29, 2010, the patient had a tension free vaginal tape procedure with cystoscopy by Dr. Su Hilt.  The patient has a history of mixed urinary incontinence.  The patient tolerated her procedure well. The patient reports that she has done very well at home.  Last night, she laughed and felt a "pop."  She then noticed that she had leakage of urine.  She laughed this morning and also had leakage of urine.  She presents for evaluation.  She does not leak urine this morning if she is simply sitting still.  The patient was diagnosed with detrusor instability in 2004.  OBSTETRICAL HISTORY:  The patient has had 2 spontaneous vaginal deliveries at term.  PAST MEDICAL HISTORY:  The patient had a LEEP procedure in 2004.  The patient has a history of polymyalgia rheumatica, diabetes, hypertension, asthma, thyroid cancer, fatty liver, scoliosis, gastroesophageal reflux, chronic back pain, and chronic pelvic pain.  In 1969, the patient had a tonsillectomy.  In 1982, the patient had an appendectomy.  In 1983, she had a bilateral tubal ligation.  In 2004, she had hysteroscopy with dilatation and curettage with a LEEP procedure.  In 2006, she had a cholecystectomy.  In 2005, she had a thyroidectomy because of thyroid cancer.  In 2007, she had lap band surgery.   In 2008, she had left knee replacement.  In 2007, the patient had a left breast biopsy for benign disease.  In 2008, she had a repeat hysteroscopy with dilatation and curettage.  In 2010, she had left knee surgery.  SOCIAL HISTORY:  The patient smokes 1 pack of cigarettes each day.  She denies alcohol and illegal drug use.  FAMILY HISTORY:  Noncontributory.  CURRENT MEDICATIONS: 1. Fentanyl patch 75 mg. 2. Synthroid 125 mcg daily. 3. Nucynta 75 mg daily. 4. Phenergan 25 mg every 6 hours as needed for nausea. 5. Chantix starter pack. 6. Toviaz 8 mg daily. 7. Flector patch twice daily as needed for knee pain.  DRUG ALLERGIES:  The patient denies latex allergies and Betadine allergies.  She reports that she is allergic to SULFA medications.  REVIEW OF SYSTEMS:  The patient has a history of chronic pain.  Please see her history of present illness.  PHYSICAL EXAMINATION:  VITAL SIGNS:  Blood pressure is 150/100, pulse is 102, respirations 20, temperature is 98. GENERAL:  Talkative woman, in no acute distress. CHEST:  Shows rhonchi from cigarette smoking. HEART:  Regular rate and rhythm. ABDOMEN:  Nontender.  Her incisions are  well healed. EXTREMITIES:  Grossly normal. NEUROLOGIC:  Grossly normal. PELVIC:  External genitalia is normal.  The vagina shows a well-healed vaginal scar.  Vicryl sutures are visible and 1 Vicryl stitch appears to have broken.  There is no evidence of mesh erosion.  The vagina is atrophic.  There is a slight cystocele and rectocele present.  The cervix is nontender.  The uterus is upper limits of normal size and nontender.  Adnexa, no masses are appreciated.  ASSESSMENT: 1. Status post tension free vaginal tape procedure with cystoscopy on     September 29, 2010. 2. Leakage of urine, believed to be from mixed urinary incontinence.     There is no evidence of damage to the bladder and no evidence of     fistula formation.  There is no evidence of  infection. 3. Multiple medical problems and comorbidities as mentioned above.  PLAN:  We will check the patient's urinalysis as well as urine culture. The patient will follow up with Dr. Su Hilt next week.  I do not recommend that anything more be done at this point.     Janine Limbo, M.D.     AVS/MEDQ  D:  10/21/2010  T:  10/21/2010  Job:  [id number]  cc:   Osborn Coho, M.D. Fax: [phone]  Electronically Signed by Kirkland Hun M.D. on 11/06/2010 11:13:28 AM

## 2010-11-13 ENCOUNTER — Encounter: Payer: Self-pay | Admitting: Cardiology

## 2010-11-14 ENCOUNTER — Ambulatory Visit (HOSPITAL_BASED_OUTPATIENT_CLINIC_OR_DEPARTMENT_OTHER): Payer: Medicare Other

## 2010-11-28 LAB — POCT I-STAT, CHEM 8
BUN: 16 mg/dL (ref 6–23)
Creatinine, Ser: 0.7 mg/dL (ref 0.4–1.2)
Hemoglobin: 13.3 g/dL (ref 12.0–15.0)
Potassium: 3.7 mEq/L (ref 3.5–5.1)
Sodium: 138 mEq/L (ref 135–145)

## 2010-11-28 LAB — D-DIMER, QUANTITATIVE: D-Dimer, Quant: 1.21 ug/mL-FEU — ABNORMAL HIGH (ref 0.00–0.48)

## 2010-12-03 LAB — URINALYSIS, ROUTINE W REFLEX MICROSCOPIC
Bilirubin Urine: NEGATIVE
Glucose, UA: NEGATIVE mg/dL
Hgb urine dipstick: NEGATIVE
Ketones, ur: NEGATIVE mg/dL
Protein, ur: NEGATIVE mg/dL

## 2010-12-03 LAB — RAPID URINE DRUG SCREEN, HOSP PERFORMED
Amphetamines: NOT DETECTED
Benzodiazepines: POSITIVE — AB
Tetrahydrocannabinol: NOT DETECTED

## 2010-12-03 LAB — COMPREHENSIVE METABOLIC PANEL
ALT: 10 U/L (ref 0–35)
Albumin: 3.8 g/dL (ref 3.5–5.2)
Alkaline Phosphatase: 108 U/L (ref 39–117)
Chloride: 105 mEq/L (ref 96–112)
Glucose, Bld: 75 mg/dL (ref 70–99)
Potassium: 3.8 mEq/L (ref 3.5–5.1)
Sodium: 143 mEq/L (ref 135–145)
Total Bilirubin: 0.5 mg/dL (ref 0.3–1.2)
Total Protein: 6.1 g/dL (ref 6.0–8.3)

## 2010-12-03 LAB — DIFFERENTIAL
Basophils Relative: 1 % (ref 0–1)
Eosinophils Absolute: 0.4 10*3/uL (ref 0.0–0.7)
Monocytes Absolute: 0.6 10*3/uL (ref 0.1–1.0)
Monocytes Relative: 7 % (ref 3–12)

## 2010-12-03 LAB — CBC
HCT: 38.5 % (ref 36.0–46.0)
Platelets: 222 10*3/uL (ref 150–400)
WBC: 9.3 10*3/uL (ref 4.0–10.5)

## 2010-12-03 LAB — ACETAMINOPHEN LEVEL: Acetaminophen (Tylenol), Serum: <10 ug/mL — ABNORMAL LOW (ref 10–30)

## 2010-12-03 LAB — TSH: TSH: 0.015 u[IU]/mL — ABNORMAL LOW (ref 0.350–4.500)

## 2010-12-03 LAB — ETHANOL: Alcohol, Ethyl (B): 5 mg/dL (ref 0–10)

## 2010-12-03 LAB — T4, FREE: Free T4: 1.59 ng/dL (ref 0.80–1.80)

## 2010-12-10 LAB — BASIC METABOLIC PANEL
Calcium: 8.9 mg/dL (ref 8.4–10.5)
GFR calc Af Amer: >60 mL/min (ref >60)
GFR calc non Af Amer: >60 mL/min (ref >60)
Glucose, Bld: 92 mg/dL (ref 70–99)
Sodium: 139 mEq/L (ref 135–145)

## 2010-12-10 LAB — CBC
Hemoglobin: 12.4 g/dL (ref 12.0–15.0)
Platelets: 256 10*3/uL (ref 150–400)
RBC: 3.98 MIL/uL (ref 3.87–5.11)
RDW: 14.4 % (ref 11.5–15.5)
WBC: 13.1 10*3/uL — ABNORMAL HIGH (ref 4.0–10.5)

## 2010-12-10 LAB — GLUCOSE, CAPILLARY
Glucose-Capillary: 101 mg/dL — ABNORMAL HIGH (ref 70–99)
Glucose-Capillary: 107 mg/dL — ABNORMAL HIGH (ref 70–99)
Glucose-Capillary: 72 mg/dL (ref 70–99)
Glucose-Capillary: 83 mg/dL (ref 70–99)
Glucose-Capillary: 84 mg/dL (ref 70–99)

## 2010-12-10 LAB — PROTIME-INR
INR: 1.5 (ref 0.00–1.49)
Prothrombin Time: 14.4 seconds (ref 11.6–15.2)
Prothrombin Time: 18.4 seconds — ABNORMAL HIGH (ref 11.6–15.2)

## 2010-12-10 LAB — HEMOGLOBIN A1C: Mean Plasma Glucose: 120 mg/dL

## 2010-12-10 LAB — TISSUE CULTURE

## 2010-12-14 ENCOUNTER — Encounter: Payer: Self-pay | Admitting: Cardiology

## 2010-12-14 DIAGNOSIS — G459 Transient cerebral ischemic attack, unspecified: Secondary | ICD-10-CM

## 2010-12-19 ENCOUNTER — Encounter: Payer: Self-pay | Admitting: Cardiology

## 2010-12-19 ENCOUNTER — Ambulatory Visit (INDEPENDENT_AMBULATORY_CARE_PROVIDER_SITE_OTHER): Payer: Medicare Other | Admitting: Cardiology

## 2010-12-19 VITALS — BP 124/88 | HR 42 | Ht 69.0 in | Wt 230.3 lb

## 2010-12-19 DIAGNOSIS — G459 Transient cerebral ischemic attack, unspecified: Secondary | ICD-10-CM

## 2010-12-19 DIAGNOSIS — M79602 Pain in left arm: Secondary | ICD-10-CM

## 2010-12-19 DIAGNOSIS — I10 Essential (primary) hypertension: Secondary | ICD-10-CM

## 2010-12-19 DIAGNOSIS — M79609 Pain in unspecified limb: Secondary | ICD-10-CM

## 2010-12-19 NOTE — Progress Notes (Signed)
Mary Acosta Date of Birth: Sep 22, 1955   History of Present Illness: Mary Acosta is seen at the request of Dr. Cyndia Bent for evaluation following a TIA. She reports that she had a mild stroke on February 27 associated with drawing of her face and headache. She also had a tremor in her arm. She was seen in the emergency department had a CT of her head which was negative. She was discharged to home. She has since had an MRI performed of the neurology office that I do not have the results of but which the patient reports were all right. She also apparently has had carotid Doppler studies done on the results of which are unknown. She has not had an echocardiogram. She had no prior history of stroke. She does complain of intermittent symptoms of severe left arm pain. These are nonexertional. She does have multiple cardiac risk factors including history of hypertension and diabetes.  Current Outpatient Prescriptions on File Prior to Visit  Medication Sig Dispense Refill  . DULoxetine (CYMBALTA) 60 MG capsule Take 60 mg by mouth 2 (two) times daily.        . fentaNYL (DURAGESIC - DOSED MCG/HR) 75 MCG/HR Place 1 patch onto the skin every 3 (three) days.        Marland Kitchen levothyroxine (SYNTHROID, LEVOTHROID) 125 MCG tablet Take 125 mcg by mouth daily.        . Tapentadol HCl (NUCYNTA) 75 MG TABS Take by mouth 3 (three) times daily.        . temazepam (RESTORIL) 22.5 MG capsule Take 22.5 mg by mouth at bedtime as needed.        . clonazePAM (KLONOPIN) 1 MG tablet Take 1 mg by mouth as needed.        . Fesoterodine Fumarate (TOVIAZ PO) Take by mouth daily.        . sertraline (ZOLOFT) 50 MG tablet Take 50 mg by mouth daily. Take 1/2 tablet daily         Allergies  Allergen Reactions  . Sulfonamide Derivatives     Past Medical History  Diagnosis Date  . TIA (transient ischemic attack) 11/13/10    carotid doppler-mild (B) plaque formation  . Anxiety     with panic attacks  . Insomnia   .  Chronic pain     followed by pain clinic @ [facility]  . Diabetes mellitus   . HTN (hypertension)   . Left arm pain     Past Surgical History  Procedure Date  . Thyroidectomy   . Breast lumpectomy   . Cholecystectomy   . Appendectomy   . Knee surgery     History  Smoking status  . Former Smoker  . Quit date: 10/22/2010  Smokeless tobacco  . Not on file    History  Alcohol Use No    History reviewed. No pertinent family history.  Review of Systems: The review of systems is positive for left arm pain.  She has a history of incontinence and had bladder surgery in early February. She reports she quit smoking in February. She denies any dizziness or palpitations. She denies any shortness of breath.All other systems were reviewed and are negative.  Physical Exam: BP 124/88  Pulse 42  Ht 5\' 9"  (1.753 m)  Wt 230 lb 4.8 oz (104.463 kg)  BMI 34.01 kg/m2 She is an obese white female in no acute distress. She is normocephalic, atraumatic. Pupils are equal round and reactive to light accommodation. Extraocular  movements are full. Sclera clear. Oropharynx is clear. Neck is supple without JVD, adenopathy, thyromegaly, or bruits. Lungs are clear. Cardiac exam reveals a regular rate and rhythm without gallop, murmur, or click. Abdomen is obese, soft, nontender. She has no masses or bruits. Femoral and pedal pulses are 2+ and symmetric. She has no edema. Skin is warm and dry. Neurologic exam she is alert and oriented x3. Her responses are simple. Her cranial nerves II through XII appear to be intact. LABORATORY DATA: ECG today demonstrates marked sinus bradycardia with a pulse of 42 beats per minute. It is otherwise normal.  Assessment / Plan:

## 2010-12-19 NOTE — Patient Instructions (Signed)
We will schedule you for an Echocardiogram (ultrasound) and a nuclear stress test.  Continue current medications.  Avoid sodium in your diet.  Take ASA 81 mg daily.

## 2010-12-19 NOTE — Assessment & Plan Note (Signed)
Blood pressure is adequately controlled today. We'll continue on her current antihypertensive therapy.

## 2010-12-19 NOTE — Assessment & Plan Note (Signed)
Need to rule out ischemic heart disease this patient with multiple cardiac risk factors including diabetes, hypertension, and prior tobacco use. She also has a family history of coronary disease. We will schedule her for a Lexiscan Myoview study.

## 2010-12-19 NOTE — Assessment & Plan Note (Addendum)
It is not clear that she had a neurologic event although her symptoms are suggestive. I will recommend an echocardiogram to rule out embolic source of stroke. I recommend antiplatelet therapy with aspirin.

## 2010-12-27 ENCOUNTER — Ambulatory Visit (HOSPITAL_COMMUNITY): Payer: Medicare Other | Attending: Cardiology

## 2010-12-27 ENCOUNTER — Ambulatory Visit (HOSPITAL_COMMUNITY): Payer: Medicare Other | Attending: Cardiology | Admitting: Radiology

## 2010-12-27 VITALS — Ht 69.0 in | Wt 226.0 lb

## 2010-12-27 DIAGNOSIS — M79602 Pain in left arm: Secondary | ICD-10-CM

## 2010-12-27 DIAGNOSIS — M79609 Pain in unspecified limb: Secondary | ICD-10-CM | POA: Insufficient documentation

## 2010-12-27 DIAGNOSIS — G459 Transient cerebral ischemic attack, unspecified: Secondary | ICD-10-CM

## 2010-12-27 DIAGNOSIS — R0609 Other forms of dyspnea: Secondary | ICD-10-CM

## 2010-12-27 DIAGNOSIS — R0989 Other specified symptoms and signs involving the circulatory and respiratory systems: Secondary | ICD-10-CM

## 2010-12-27 HISTORY — PX: TRANSTHORACIC ECHOCARDIOGRAM: SHX275

## 2010-12-27 MED ORDER — TECHNETIUM TC 99M TETROFOSMIN IV KIT
11.0000 | PACK | Freq: Once | INTRAVENOUS | Status: AC | PRN
  Administered 2010-12-27: 11 via INTRAVENOUS

## 2010-12-27 MED ORDER — REGADENOSON 0.4 MG/5ML IV SOLN
0.4000 mg | Freq: Once | INTRAVENOUS | Status: AC
  Administered 2010-12-27: 0.4 mg via INTRAVENOUS

## 2010-12-27 MED ORDER — TECHNETIUM TC 99M TETROFOSMIN IV KIT
33.0000 | PACK | Freq: Once | INTRAVENOUS | Status: AC | PRN
  Administered 2010-12-27: 33 via INTRAVENOUS

## 2010-12-27 NOTE — Progress Notes (Signed)
[facility] SITE 3 NUCLEAR MED [address] [place] [address] [phone]  Cardiology Nuclear Med Study  Mary Acosta Mary Acosta is a 55 y.o. female [id number] [birth year]   Nuclear Med Background Indication for Stress Test:  Evaluation for Ischemia History:  Asthma, COPD and '04 EPP:IRJJOACZYSA vs anterior ischemia, EF=51%, '06 Cath:normal, EF=60% Cardiac Risk Factors: Family History - CAD, Hypertension, Lipids, NIDDM, Smoker and TIA, Obesity  Symptoms:  DOE, Fatigue, Rapid HR and Intermittent (L) arm pain, last episode about 2-weeks ago.   Nuclear Pre-Procedure Caffeine/Decaff Intake:  None NPO After: 12:00am   Lungs:  Clear.  o2 sat 99% on RA. IV 0.9% NS with Angio Cath:  22g  IV Site: R Wrist  IV Started by:  Bonnita Levan, RN  Chest Size (in):  44 Cup Size: D  Height: 5\' 9"  (1.753 m)  Weight:  226 lb (102.513 kg)  BMI:  Body mass index is 33.37 kg/(m^2). Tech Comments:  N/A    Nuclear Med Study 1 or 2 day study: 1 day  Stress Test Type:  Lexiscan  Reading MD: Olga Millers, MD  Order Authorizing Provider:  Dr. Peter [place]il[place]  Resting Radionuclide: Technetium 9m Tetrofosmin  Resting Radionuclide Dose: 11 mCi   Stress Radionuclide:  Technetium 73m Tetrofosmin  Stress Radionuclide Dose: 33 mCi           Stress Protocol Rest HR: 50 Stress HR:84  Rest BP: 110/62 Stress BP: 130/75  Exercise Time (min): n/a METS: n/a   Predicted Max HR: 166 bpm % Max HR: 50.6 bpm Rate Pressure Product: 63016   Dose of Adenosine (mg):  n/a Dose of Lexiscan: 0.4 mg  Dose of Atropine (mg): n/a Dose of Dobutamine: n/a mcg/kg/min (at max HR)  Stress Test Technologist: Duke Salvia, CMA-N  Nuclear Technologist:  Doyne Keel, CNMT     Rest Procedure:  Myocardial perfusion imaging was performed at rest 45 minutes following the intravenous administration of Technetium 47m Tetrofosmin. Rest ECG: Sinus bradycardia.  Stress Procedure:  The patient received IV  Lexiscan 0.4 mg over 15-seconds.  Technetium 12m Tetrofosmin injected at 30-seconds.  There were no significant changes with Lexiscan.  Quantitative spect images were obtained after a 45 minute delay. Stress ECG: No significant change from baseline ECG  QPS Raw Data Images:  Acquisition technically good; normal left ventricular size. Stress Images:  There is decreased uptake in the inferior wall. Rest Images:  Normal homogeneous uptake in all areas of the myocardium. Subtraction (SDS):  These findings are consistent with ischemia. Transient Ischemic Dilatation (Normal <1.22): .90 Lung/Heart Ratio (Normal <0.45):  .37  Quantitative Gated Spect Images QGS EDV:  105 ml QGS ESV:  33 ml QGS cine images:  NL LV Function; NL Wall Motion QGS EF: 69%  Impression Exercise Capacity:  Lexiscan with no exercise. BP Response:  Normal blood pressure response. Clinical Symptoms:  No chest pain. ECG Impression:  No significant ST segment change suggestive of ischemia. Comparison with Prior Nuclear Study: No images to compare  Overall Impression:  Abnormal stress nuclear study with mild inferior ischemia.  Olga Millers   Signed by Duke Salvia on 12/27/2010 at 3:43 PM.

## 2010-12-28 ENCOUNTER — Telehealth: Payer: Self-pay | Admitting: *Deleted

## 2010-12-28 NOTE — Progress Notes (Signed)
Nuclear stress test is consistent with inferior ischemia. EF is normal. We'll need to see back for possible cath. Theron Arista [place]

## 2010-12-28 NOTE — Progress Notes (Signed)
COPY ROUTED TO DR.JORDAN.Falecha Clark ° °

## 2010-12-28 NOTE — Telephone Encounter (Signed)
LM to call back for echo and nuclear stress results.

## 2010-12-28 NOTE — Telephone Encounter (Signed)
Message copied by Murrell Redden on Fri Dec 28, 2010  4:00 PM ------      Message from: [place], PETER      Created: Fri Dec 28, 2010  2:00 PM       Echo looks good. No source of embolus. See nuclear stress test note.

## 2010-12-31 ENCOUNTER — Telehealth: Payer: Self-pay | Admitting: *Deleted

## 2010-12-31 NOTE — Telephone Encounter (Signed)
Notified of stress test results. Will see in office Thurs 5/10

## 2010-12-31 NOTE — Progress Notes (Signed)
Notified of stress test results. To see in office Thurs, 5/10 to discuss cath.

## 2011-01-02 ENCOUNTER — Encounter: Payer: Self-pay | Admitting: *Deleted

## 2011-01-03 ENCOUNTER — Encounter: Payer: Self-pay | Admitting: Cardiology

## 2011-01-03 ENCOUNTER — Ambulatory Visit (INDEPENDENT_AMBULATORY_CARE_PROVIDER_SITE_OTHER): Payer: Medicare Other | Admitting: Cardiology

## 2011-01-03 DIAGNOSIS — M79602 Pain in left arm: Secondary | ICD-10-CM

## 2011-01-03 DIAGNOSIS — R079 Chest pain, unspecified: Secondary | ICD-10-CM

## 2011-01-03 DIAGNOSIS — M79609 Pain in unspecified limb: Secondary | ICD-10-CM

## 2011-01-03 DIAGNOSIS — G459 Transient cerebral ischemic attack, unspecified: Secondary | ICD-10-CM

## 2011-01-03 MED ORDER — DIAZEPAM 5 MG PO TABS: 5.0000 mg | ORAL_TABLET | ORAL | Status: AC | PRN

## 2011-01-03 NOTE — Progress Notes (Signed)
Samul Dada Date of Birth: 1956-03-09   History of Present Illness: Mrs. Boening is seen 4 followup today after her recent cardiac studies. She states her left arm heaviness has improved although she still has some discomfort in her shoulder area. She has had no significant chest pain. She's had no recurrent TIA or stroke symptoms. Her echocardiogram demonstrated normal left atrial function. There was mild mitral insufficiency and mild left atrial enlargement. Her Lexiscan Myoview  Study demonstrated evidence of inferior ischemia. This is a fairly mild defect.  Current Outpatient Prescriptions on File Prior to Visit  Medication Sig Dispense Refill  . DULoxetine (CYMBALTA) 60 MG capsule Take 60 mg by mouth daily.       . fentaNYL (DURAGESIC - DOSED MCG/HR) 75 MCG/HR Place 1 patch onto the skin every 3 (three) days.        Marland Kitchen levothyroxine (SYNTHROID, LEVOTHROID) 125 MCG tablet Take 125 mcg by mouth daily.        . Tapentadol HCl (NUCYNTA) 75 MG TABS Take by mouth 3 (three) times daily as needed.       . temazepam (RESTORIL) 22.5 MG capsule Take 22.5 mg by mouth at bedtime as needed.        Marland Kitchen DISCONTD: clonazePAM (KLONOPIN) 1 MG tablet Take 1 mg by mouth as needed.        Marland Kitchen DISCONTD: Fesoterodine Fumarate (TOVIAZ PO) Take by mouth daily.        Marland Kitchen DISCONTD: sertraline (ZOLOFT) 50 MG tablet Take 50 mg by mouth daily. Take 1/2 tablet daily         Allergies  Allergen Reactions  . Sulfonamide Derivatives Nausea Only    Past Medical History  Diagnosis Date  . TIA (transient ischemic attack) 11/13/10    carotid doppler-mild (B) plaque formation  . Anxiety     with panic attacks  . Insomnia   . Chronic pain     followed by pain clinic @ [facility]  . Diabetes mellitus     insulin-dependent  . HTN (hypertension)   . Left arm pain   . COPD (chronic obstructive pulmonary disease)   . Osteoarthritis     with severe disease in knee  . Hypercholesterolemia   . Chronic back  pain     Osteoarthritis  . History of pneumonia   . History of chest pain   . Tobacco abuse   . History of echocardiogram 12/24/2006    EF was 55-60%  / it did show mild LVH, otherwise was normal     . Mixed stress and urge urinary incontinence 09/29/2010    she had tension-free vaginal tape followed by cystoscopy  . Polymyalgia rheumatica   . Thyroid cancer   . Fatty liver disease, nonalcoholic   . Scoliosis   . Postmenopausal bleeding   . GERD (gastroesophageal reflux disease)   . Morbid obesity 07/70/07    underwent -- LAPAROSCOPIC GASTRIC BANDING  . Cholelithiasis 06/10/2002    Laparoscopic cholecystectomy with intraoperative cholangiogram.  . Obstructive sleep apnea 04/04/2005    underwent -- NOCTURNAL POLYSOMNOGRAM: 1. Moderately severe obstructive sleep apnea/hypopnea syndrome, respiratory disturbance index 40.6 per hour with loud snoring & oxygen desaturation to 71%./ 2.Successful C-PAP titration to 16 centimeter of water pressure, respiratory disturbance index 0 per hour, using a medium comfort full to mask with heated humidifier.   . Asthma     Past Surgical History  Procedure Date  . Thyroidectomy 2005    for thyroid cancer  .  Breast lumpectomy 2007    (benign)  . Appendectomy 1982  . Knee surgery 2007    Revision left total knee arthroplasty, poly revision / Left total knee arthroplasty with progressive  painful ligamentous laxity   . Cardiac catheterization 02/02/2005    EF is estimated at 60% /  Normal coronary anatomy / Normal left ventricular function   . Cystoscopy 09/29/2010    placement of  tension-free vaginal tape followed by cystoscopy, tolerating procedure well  . Tonsillectomy 1969  . Tubal ligation 1983  . Hysteroscopy w/d&c 05/16/2003  . Leep 05/16/2003    Loop electrosurgical excision procedure  . Cryoablation 05/16/2003    cryoablation of her cervix  . Laparoscopic gastric banding 03/01/2006    Laparoscopic truncal vagotomy, endoscopy, laparoscopic  placement of a VG band  . Cholecystectomy, laparoscopic 06/10/2002     with intraoperative cholangiogram / due to Cholelithiasis  . Knee arthroscopy w/ debridement 03/29/2006    Left knee arthroscopy with debridement of significant  chondromalacia of the medial compartment and the patellofemoral joint including the trochlea.    History  Smoking status  . Former Smoker  . Quit date: 10/22/2010  Smokeless tobacco  . Not on file    History  Alcohol Use No    No family history on file.  Review of Systems: The review of systems is positive for left arm pain.  She has a history of incontinence and had bladder surgery in early February. She reports she quit smoking in February. She denies any dizziness or palpitations. She denies any shortness of breath.All other systems were reviewed and are negative.  Physical Exam: BP 138/80  Pulse 56  Ht 5\' 9"  (1.753 m)  Wt 224 lb (101.606 kg)  BMI 33.08 kg/m2 She is an obese white female in no acute distress. She is normocephalic, atraumatic. Pupils are equal round and reactive to light accommodation. Extraocular movements are full. Sclera clear. Oropharynx is clear. Neck is supple without JVD, adenopathy, thyromegaly, or bruits. Lungs are clear. Cardiac exam reveals a regular rate and rhythm without gallop, murmur, or click. Abdomen is obese, soft, nontender. She has no masses or bruits. Femoral and pedal pulses are 2+ and symmetric. She has no edema. Skin is warm and dry. Neurologic exam she is alert and oriented x3. Her responses are simple. Her cranial nerves II through XII appear to be intact. LABORATORY DATA: As noted above.  Assessment / Plan:

## 2011-01-03 NOTE — Assessment & Plan Note (Signed)
Patient has multiple cardiac risk factors. Her Myoview study suggested inferior wall ischemia. She did have a cardiac catheterization in 2006 which was normal. We discussed further evaluation given her multiple risk factors. The 2 options at this point would be a cardiac CT angiogram versus a cardiac catheterization. I would favor a cardiac CT angiogram since this is less invasive and I think if this shows no significant disease in the right coronary we can reassure her. She is well beta blocked at this point. We will check a basic metabolic panel to clear her for a dye load.

## 2011-01-03 NOTE — Assessment & Plan Note (Signed)
Echo shows no cardiac source for embolic disease. I will recommend continued risk factor modification. Continue antiplatelet therapy.

## 2011-01-04 ENCOUNTER — Telehealth: Payer: Self-pay | Admitting: *Deleted

## 2011-01-04 ENCOUNTER — Encounter: Payer: Self-pay | Admitting: Cardiology

## 2011-01-04 ENCOUNTER — Other Ambulatory Visit: Payer: Self-pay | Admitting: *Deleted

## 2011-01-04 LAB — BASIC METABOLIC PANEL
Calcium: 9.1 mg/dL (ref 8.4–10.5)
GFR: 75.98 mL/min (ref >60.00)
Glucose, Bld: 116 mg/dL — ABNORMAL HIGH (ref 70–99)
Potassium: 3.9 mEq/L (ref 3.5–5.1)
Sodium: 142 mEq/L (ref 135–145)

## 2011-01-04 NOTE — Telephone Encounter (Signed)
Notified of time for cardiac CT on 5/23 @ 1:00. Instructions will be mailed to her. Will fax in RX for Valium 5 mg to be taken 1 hr prior to exam. Will call if has any further questions.

## 2011-01-07 ENCOUNTER — Telehealth: Payer: Self-pay | Admitting: *Deleted

## 2011-01-07 NOTE — Telephone Encounter (Signed)
Message copied by Murrell Redden on Mon Jan 07, 2011  9:41 AM ------      Message from: [place], PETER      Created: Mon Jan 07, 2011  8:38 AM       bmet ok. Ok for CTA.

## 2011-01-07 NOTE — Telephone Encounter (Signed)
Notified of lab results. 

## 2011-01-08 NOTE — Discharge Summary (Signed)
NAME:  Mary Acosta, Mary Acosta NO.:  [id number]   MEDICAL RECORD NO.:  [id number]          PATIENT TYPE:  INP   LOCATION:  5006                         FACILITY:  [facility]   PHYSICIAN:  Vanita Panda. Magnus Ivan, M.D.DATE OF BIRTH:  May 01, 1956   DATE OF ADMISSION:  01/23/2007  DATE OF DISCHARGE:  01/27/2007                               DISCHARGE SUMMARY   PRIMARY DISCHARGE DIAGNOSIS:  Severe left knee degenerative joint  disease.   SECONDARY DIAGNOSES:  1. Chronic pain syndrome.  2. Diabetes.  3. Hypothyroidism.  4. Polymyalgia rheumatica.   PROCEDURE:  Left total knee replacement on Jan 23, 2007.   HOSPITAL COURSE:  [name] is a 55 year old with chronic pain  involving her left knee.  This is a knee that has undergone orthoscopic  surgery and multiple injections.  She had severe degenerative joint  disease in the knee, and after worsening pain, and definitely affecting  her activities of daily living, talked with her primary care physician  who agreed that we could proceed with a total knee replacement.  Both  her primary care physician, her cardiologist, and myself all agree that  this is high risk for her as far as the potential for infection.  She  states that I don't think I will live more than 5 more years, so she  felt the need to go ahead and proceed with this surgery.  Her medical  management was optimized prior to surgery so we could hopefully proceed  safely with this with the idea that this was of high risk for the  potential for infection.  She stated that she understood these risks and  does agree to proceed with surgery.  She was taken to the operating room  on the day of surgery where she underwent a left total knee arthroplasty  without complications.  For the details and description of the  operation, please refer to the dictated op note in the patient's medical  record.  Postoperatively, she was admitted to a regular floor bed and  actually did  quite well.  She was followed by Dr. Creola Corn and his  partners from [facility] while she was in the hospital.  She adjusted to oral pain medications eventually and began working with  physical therapy and occupational therapy who deemed her safe for  discharge to home by the day of discharge.  With her medical management,  she had stabilized well and we got her transitioned on to Coumadin.  By  the day of discharge, she was again deemed safe for discharge to home  from a medical, surgical, and therapy standpoint.   DISPOSITION:  To home.   DISCHARGE INSTRUCTIONS:  Once she is at home, Mary Acosta will come into the  home to provide home health for range of motion of her knee as well as  gait training, balance, and coordination.  They will also be set up to  help manage her Coumadin with bi-weekly lab draws and adjustments of her  Coumadin accordingly.  She will be  discharged home on oral pain medications, but I have  instructed her to  use these as sparingly as possible.  She will likewise resume her  current medications for her diabetes and her hypothyroidism, and her  other home medications as instructed.      Vanita Panda. Magnus Ivan, M.D.  Electronically Signed     CYB/MEDQ  D:  02/24/2007  T:  02/25/2007  Job:  [id number]

## 2011-01-08 NOTE — Procedures (Signed)
NAME:  Mary Acosta, Mary Acosta                ACCOUNT NO.:  [id number]   MEDICAL RECORD NO.:  [id number]          PATIENT TYPE:  REC   LOCATION:  TPC                          FACILITY:  [facility]   PHYSICIAN:  Erick Colace, M.D.DATE OF BIRTH:  November 06, 1955   DATE OF PROCEDURE:  DATE OF DISCHARGE:                               OPERATIVE REPORT   PROCEDURE:  Left-sided L5 dorsal ramus injection, left L4 medial branch  block, L3 medial branch block, under fluoroscopic guidance.   INDICATIONS:  Lumbar facet-mediated pain.  She has had previous good  response to lumbar RF performed in May 2008, but now it is wearing off,  particularly on the left side.  Her pain is only partially responsive to  medication management, including narcotic analgesic medications, and it  interferes with shopping, household duties, and mobility.   Informed consent was obtained after describing the risks and benefits of  the procedure to the patient.  These include bleeding, bruising,  infection, loss of bowel or bladder function, temporary or permanent  paralysis.  She elected  proceed and has given written consent.  The  patient was placed prone on the fluoroscopy table.  Betadine prep,  sterile drape.  A 25-gauge 1-1/2 inch needle was used to anesthetize the  skin and subcutaneous tissue with 1% lidocaine x 2 mL.  Then, a 22-gauge  5-inch spinal needle was inserted under fluoroscopic guidance.  Starting  in the left S1 SAP sacral ala junction, bone contact made, confirmed  with lateral imaging.  Omnipaque 180 x 0.5 mL demonstrated no  intravascular uptake.  Then, 0.5 mL of a solution containing 1 mL of 40  mg/mL dexamethasone and 2 mL of 2% MPF lidocaine was injected.  Then,  the left L5 SAP transverse process junction targeted, bone contact made,  confirmed with lateral imaging.  Omnipaque 180 x 0.5 mL demonstrated non  intravascular uptake, and 0.5 mL of dexamethasone/lidocaine solution was  injected.  Then, the  left L4 SAP transverse process junction targeted,  bone contact made, confirmed with lateral imaging.  Omnipaque 180 x 0.5  mL demonstrated no intravascular uptake.  Then, 0.5 mL of  dexamethasone/lidocaine solution was injected.  The patient tolerated  the procedure well.  Pre- and postinjection vitals were stable.  Postinjection instructions given.  Pre-injection pain level 6-7/10,  postinjection 0/10.  She will be scheduled for repeat lumbar RF on the  left side in 2-3 weeks.      Erick Colace, M.D.  Electronically Signed     AEK/MEDQ  D:  08/10/2007 15:32:55  T:  08/11/2007 10:02:58  Job:  [id number]

## 2011-01-08 NOTE — Assessment & Plan Note (Signed)
[name] returns today after having  been last seen by me on October 22, 2007 with a left sacroiliac injection under fluoroscopic guidance.  In the interval time period she has undergone EMG testing of the lower  extremities per Dr. Sullivan Lone at [facility].  The  patient complained that most of the tests were performed by a resident.  I do have the results available to me and there was no electrodiagnostic  evidence of myopathy.  There was electrodiagnostic evidence of  polyneuropathy.  Sensory motor primarily axonal.   In terms of her pain level, the left sacroiliac injection really was not  of much benefit.  She has had benefit from the repeat lumbar fact  radiofrequency neurotomies.  She does feel like the nortriptyline is  beneficial at the 100 mg dose for her lower extremity burning pain.  She  does complain that the medication does not seem to be strong enough and  has been on higher dosages in the past.   She has had no new medical problems.  She has been trying to reduce on  the prednisone she is on chronically.  She has had rheumatologic  evaluation per Dr. Corliss Skains in the past.  The patient complains of pain  in her left knee which has a total knee replacement as well as bilateral  thighs more of a cramping pain.  Cramping pain in the back of the calves  as well as pain in the bilateral buttocks area as well as over the  sacral area and right elbow.   Mood and affect labile.  She is crying at times today and states she  wishes she had a cure for all her problems.   PHYSICAL EXAMINATION:  MUSCULOSKELETAL:  She has some tenderness to  palpation over the PSIS bilaterally as well as over the gluteus maximus  area.  She has tenderness bilateral knees but there is more on the  lateral aspect of the tibia rather than over the quadriceps area.  She  has some tenderness of the left elbow as well.  Sensation is reduced in  the feet compared to the knees but  intact in the hands.   IMPRESSION:  1. Chronic pain of multiple etiologies:      a.     She does have facet arthropathy in the lumbar spine which       has responded at least partially to the repeat lumbar       radiofrequency procedure.      b.     She has chronic postoperative pain left knee which is more       in the lateral tibial area.  She has followed up with orthopedics       on this and states that everything checked out okay.      c.     Chronic muscle cramping.  No electrodiagnostic evidence of       myopathy.  No medications that would obviously cause this.  With       her rather diffuse symptoms, I question whether she may have some       elements of a fibromyalgia syndrome, although she does not have       the required 11 out of 18 tender points and her locations are       really not classic but this certainly is a consideration.  2. A mildly elevated sed rate.  The last one was in the 36 range.  She  has had a prior rheumatologic evaluation.  I do not see any signs      of inflammatory arthritis.  3. Depression.  She has been seen by psychology, Dr. Leonides Cave, who      diagnosed her with a major depressive disorder, recurrent moderate.      She is on a tricyclic antidepressant already.  She has adequate      nortriptyline level.   PLAN:  1. I will increase her narcotic analgesics 30 mg b.i.d. of her MS      Contin and 15 b.i.d. of her MSIR.  2. Consider rheumatology re-evaluation, depending on neuro followup.  3. I do not think any further spine injections will be needed at this      point.  4. She also asked me about spinal cord stimulation which I do not      think she would qualify for given psychologic comorbidities as well      as her pain being at least mainly axial spine rather than      radicular.  Spinal cord      stimulation in the setting of diabetic peripheral neuropathy is not      well established.  5. I will see her back in 1 month.      Erick Colace, M.D.  Electronically Signed     AEK/MedQ  D:  11/19/2007 16:40:04  T:  11/19/2007 20:41:10  Job #:  [id number]   cc:   Gwen Pounds, MD  Fax: [phone]   Leonie Man, M.D.  [address]  [address]  [place]  [place] [address]   Magnus Ivan, Dr.   Lillia Carmel, M.D.  Fax: [phone]

## 2011-01-08 NOTE — Assessment & Plan Note (Signed)
HISTORY:  A 55 year old female with chronic back pain since 1995.  Has  had left greater than right knee pain.  Has recent left total knee  replacement per Dr. Magnus Ivan in May of 2008.  She has CT of the lumbar  spine showing anterior listhesis L4-5 10 mm stable with flexion and  extension.  No significant central canal stenosis.  Has had hypertrophy  L3-4, L4-5, and L5-S1, and has undergone medial branch radiofrequency  procedures per radiology.   She has a history of bilateral median and mono-neuropathy at the wrist,  moderate carpal tunnel syndrome, left ulnar neuropathy on EMG dated Jan 02, 2007.  She has had complaints of numbness and pain in the feet.  Has  been treated for hammer toes and metatarsalgia in the past.  She has had  some kind of foot injections on the lateral border of the foot per her  report.  She also complains of cramping pain in the thigh.  The cramping  pain is mainly anterior more than posteriorly.  She has had EMG MCV done  May 29, 2007 showing absent serial nerve conductions, which would be  consistent with an early diabetic neuropathy.  However, she also had  evidence of myopathic potentials in the vastus medialis on the right,  and thoracic paraspinals.  Her adductor longus had some polyphagia.  Her  right adductor longus also had myopathic potential, as well as while not  specifically recruiting in the paraspinals, had some myopathic appearing  potentials in the paraspinals as well in the thoracic region.   She has been on chronic prednisone.  She also showed me where she has  been on anabolic steroids, and per her report, she thought she was  getting B12 shots.  This was back in the late 90s and she had about 3  years' worth of norethandrolone from a local physician.   Her current complaints revolve around her feet, legs, and thighs.  Her  back pain is really not so bad right now.  She can walk 3 to 4 minutes  at a time.  She can do all of her ADL  independently.  She has no  shoulder pain or weakness.  She has had some GI problems with  alternating diarrhea and constipation.   Her medications for pain include nortriptyline 40 mg nightly, and she  feels like this is really helping her foot pain quite a bit.  She has  also been on MS-Contin 15 mg twice a day now, and this really has helped  improve her daily activity level.  She is getting clonazepam and  Provigil from her primary physician.  She uses Lantus insulin and  Synthroid that she gets from an endocrinologist, Dr. Talmage Nap.  She has  allergies to SULFA.   EXAMINATION:  Her feet have decreased sensation to pinprick, and really  this is up until just around 10 cm below the patella.  She has hammer  toes in the small toes bilaterally.  She has no tenderness over the  lateral border of the foot.  She has no hypersensitivity over the foot  area.  She does have red scaly feet.  Her skin is warm, however.  She  has a healed left knee surgical scar.  Her thighs have no appreciable  weakness with hip flexion and she has no gluteus weakness with prone hip  extension.  Distal muscle strength is about 4 in the ankle dorsiflexor  and plantar flexor.  Upper extremity strength  is normal.  She does have  reduced sensation in the little finger, which is on the left side  greater than right side, which is consistent with her prior EMG finding  of left ulnar neuropathy.   IMPRESSION:  1. Chronic back pain.  This has really been fairly well controlled      after the medial branch blocks, and now on fairly low dose MS-      Contin.  2. Probable diabetic neuropathy, mainly sensory affecting the      bilateral lower extremities below the knees.  3. Cramping with some evidence of myopathic potentials on EMG.  The      cramping is in the thighs.  I do not think this is really explained      by her lumbar spine.  Certainly, she is a complex individual and      this may not be any progressive type  neuromuscular disease.      However, she has been on long-term steroids.  Has even been on      anabolic steroids in the past.  Given her complaints of the      cramping, we would like to get this checked out a little bit      further, I have referred her locally to neurology, but she would      prefer to go to [place], and we will see if we can get her to see      one of the neuromuscular neurologists.   I will see her back to manage her pain medication, as well as encourage  increased function.  May need a TENS unit.      Erick Colace, M.D.  Electronically Signed     AEK/MedQ  D:  06/08/2007 16:16:53  T:  06/09/2007 10:24:15  Job #:  [id number]   cc:   Gwen Pounds, MD  Fax: [phone]   Neuromuscular Dr. Loma Boston [facility]   Lillia Carmel, M.D.  Fax: [phone]   Vanita Panda. Magnus Ivan, M.D.  Fax: [phone]

## 2011-01-08 NOTE — Consult Note (Signed)
NAME:  Mary Acosta, SPILLANE NO.:  [id number]   MEDICAL RECORD NO.:  [id number]          PATIENT TYPE:  INP   LOCATION:  5006                         FACILITY:  [facility]   PHYSICIAN:  Gaspar Garbe, M.D.DATE OF BIRTH:  1955-09-01   DATE OF CONSULTATION:  DATE OF DISCHARGE:                                 CONSULTATION   CHIEF COMPLAINT:  Medical management requested per Dr. Magnus Ivan.   HISTORY OF PRESENT ILLNESS:  The patient is a 55 year old white female  with a history of obstructive sleep apnea, obesity following lap-band  surgery, diabetes mellitus 2, polymyalgia rheumatica, chronic neuropathy  pains, hypertension, hyperlipidemia, COPD secondary to smoking and  osteoarthritis of her knees, who was admitted on May 30 by Dr. Magnus Ivan  for left total knee placement.  Given her medical complexity, we have  been asked to follow her for medication management.  On arrival in the  room, the patient was initially asking me for Lyrica and Cymbalta which  she indicated Dr. Talmage Nap had been giving her because she was having pain  at her left hip and she felt that this would most likely make it better.  She was, in fact, eating and drinking this morning without any  difficulties, but was mostly focused on her pain level, denied any other  complaints overnight, indicated that she slept reasonably well.   ALLERGIES:  SULFA AND ASPIRIN.   MEDICATIONS:  1. Lasix p.r.n.  2. Advair 250/50 one puff twice daily.  3. Prednisone 20 mg q.day.  4. Xanax 0.25 to 0.5 mg p.o. q.6 hours p.r.n. for anxiety.  5. Cymbalta 60 mg p.o. q.day.  6. Lyrica 300 mg p.o. b.i.d., this has been held and restarted      recently.  7. Aciphex 20 mg p.o. q.day.  8. Synthroid 175 mcg p.o. q.day.  9. Vitamin D 1000 units q.day.  10.Dilaudid for pain at home.  11.Lantus 75 units subcutaneously q.h.s.  12.Lisinopril 15 mg p.o. q.h.s.   PAST MEDICAL HISTORY:  1. Chronic pain noted as above.  2.  Insulin-dependent diabetes mellitus secondary to body habitus as      well as prednisone.  3. Hypertension.  4. Hyperlipidemia.  5. COPD.  6. Obstructive sleep apnea on CPAP.  Patient did not bring her home      mask and is not sure what her settings are.  7. Osteoarthritis of the knees.  8. Polymyalgia rheumatica.   PAST SURGICAL HISTORY:  1. Patient had a left knee arthroscopy August of 2007.  2. She has undergone left total knee surgery yesterday  3. Lap-band performed on July of 2007.  4. Total thyroidectomy because of nodules which were benign in March      of 2007.  5. Appendectomy.  6. Cholecystectomy.  7. Tubal ligation.   SOCIAL HISTORY:  Lives in [place].  She is married with two children.  She is a Associate Professor, smokes greater than one pack per day and has  done so greater than 30 years, denies any alcohol usage.   FAMILY HISTORY:  Mother has coronary artery disease, diabetes  and  hypertension.  Father died at the age of 49 of an MI; he had  hypertension as well.  She has a brother who died at age 75 of Down  syndrome, a sister who died at age 82 with Down syndrome and two sisters  who are otherwise alive.   REVIEW OF SYSTEMS:  Patient denies any fevers, chills or sweats,  headache, chest pain, shortness of breath, no rashes or lesions, urinary  symptoms.  Patient indicates numbness and tingling in her left leg and  her hip which she has had before.  She is status post total knee repair  on the left, but indicates that the knee itself does not hurt.  Denies  any nausea or vomiting or diarrhea.  All other systems are negative.   OBJECTIVE:  The patient is a full code.  VITAL SIGNS:  Temperature 97.0, pulse 96, respiratory rate 22, blood  pressure 108/64, pain 4/10, sating 94% on two liters.  GENERAL:  No acute distress.  HEENT:  Normocephalic, atraumatic.  PERLA/EOMI.  ENT is within normal  limits.  NECK:  Supple.  No lymphadenopathy, JVD or bruit.  HEART:   Regular rate and rhythm.  No murmur, rub or gallop appreciated.  LUNGS:  Clear to auscultation bilaterally.  ABDOMEN:  Soft, nontender.  Normoactive bowel sounds.  Multiple surgical  scars are noted consistent with her before-mentioned surgery.  EXTREMITIES:  Her left knee is currently in an immobilizer.  There is no  streaking or redness of left hip and the size are equal on both sides.  She has 1+ peripheral pulses bilaterally.  NEURO:  Patient is oriented to person, place and time, and can move all  four extremities without difficulty.  No cranial nerve deficits are  noted.   LABS:  White count elevated at 25, hemoglobin 13. 5, hematocrit 41.3,  platelets 308.  BUN and creatine are 11 and 0.8, respectively.  Glucose  this morning 151, other electrolytes are within normal limits.  Her  current INR is 1.0.   ASSESSMENT/PLAN:  1. Medical management on this patient.  She appears to be medically      stable at this point, is mostly complaining of pain in her hip.  As      her Lyrica and Cymbalta have not been ordered, I will go ahead and      start these at the doses that she indicates that she takes at home,      though she indicates that she takes it somewhat different than what      Dr. Timothy Lasso had dictated, but she indicated that Dr. Talmage Nap or someone      who had changed these medicines in the meantime and Dr. Timothy Lasso may      not have been aware; do not think there is any harm in starting      these medicines.  2. Diabetes type 2.  Will start her on 20 units of Lantus this morning      and follow her sugars today for usual 75 units, however depending      on how she feels and whether she has met with some sedation or      stomach upset because of pain medications, may lower her need, will      gradually up-titrate this.  If her blood sugar is, in fact, above      200 before supper time, will add another 20 units a day and     continue  to up-titrate this towards her home dose as her  activity      and diet normalize.  3. Obstructive sleep apnea.  I would be happy to order the patient's      CPAP, but she does not have her mask with her and does not know her      settings so this is done.  She indicates that the CPAP often makes      her feel bad and she may not want it anyway; I will leave this up      to her discretion.  4. Leukocytosis.  This is chronic secondary to steroids and has been      seen in the past; will continue to follow and is more concerned      with fevers and her white count.  Will follow her clinically.  5. DVT prophylaxis with Coumadin per ortho.  6. Continue her Synthroid at 175 mcg.  This has been checked in the      past month.  7. Continue lisinopril 15 mg p.o. q.p.m.  8. Have added back her proton pump inhibitor.  She normally takes      Aciphex at home; will use Protonix per hospital formulary.  9. Will add her vitamin D to her regimen as well.   Thank you for allowing me to consult on this patient.      Gaspar Garbe, M.D.  Electronically Signed     RWT/MEDQ  D:  01/24/2007  T:  01/24/2007  Job:  [id number]   cc:   Gwen Pounds, MD  Vanita Panda. Magnus Ivan, M.D.

## 2011-01-08 NOTE — Assessment & Plan Note (Signed)
[name] today, a 55 year old female with chronic back pain  since 1995.  She has left greater than right knee pain.  Recent total  left knee replacement by Dr. Magnus Ivan in May 2008.  She has  anterolisthesis L4 and L5, stable with flexion and extension.  She has  had some success with medial branch radiofrequency procedures in May  2008.  She has had a flareup of her back pain.  She has also had a  flareup of her foot pain.  She has cracked skin on her heel that is  causing her pain and she feels that this is making her walk differently.  She has an appointment with an orthopedic foot specialist from [facility] on November 11.   REVIEW OF SYSTEMS:  Indicates problems with depression, anxiety, trouble  walking, numbness.  She needs assistance with meal prep, household  duties and shopping.   ALLERGIES:  SULFA.   CURRENT MEDICATIONS:  MS Contin 15 mg b.i.d.  She is also using a TENS  unit.  She is getting some benefit from nortriptyline 40 mg q.h.s.   PHYSICAL EXAMINATION:  VITAL SIGNS:  Blood pressure 141/82, pulse 93,  respiratory rate 18, O2 sat 97% on room air.  GENERAL:  Obese female in no acute distress.  She has decreased  sensation to pinprick to the knees.  She has red, scaly feet.  Her skin  is warm.  She has no open areas.  She does have cracked skin on her left  heel.  She has a gauze bandage on that and Bacitracin.  There is no  evidence of drainage from that area or any surrounding erythema or  induration.  She has distal strength of 4 at the ankle, dorsiflexion and  plantar flexion.  Upper extremity strength is normal.   IMPRESSION:  Chronic back pain.  She had been doing quite well with this  after her medial branch radiofrequency neurotomy and low dose MS Contin.  She seems to be flared up today and she has noted some increased foot  pain as well.   PLAN:  1. We will increase her nortriptyline to 50 q.h.s.  2. We will add morphine sulfate IR 15 mg one to  two p.o. breakthrough      pain #30 for a month.  3. We will continue her MS Contin CR 15 mg p.o. b.i.d.  4. I will see her back in approximately 1 month.  She will see doctor      from [facility] in terms of her foot pain.  I believe this is      partially due to her neuropathy, but may have additional problems      with metatarsalgia.  She has tried adaptive shoe wear.  She is      looking for some type of injection which I am not really sure would      be necessarily curative in her case.      Erick Colace, M.D.  Electronically Signed     AEK/MedQ  D:  06/22/2007 17:10:06  T:  06/23/2007 09:56:40  Job #:  [id number]   cc:   Gwen Pounds, MD  Fax: [phone]

## 2011-01-08 NOTE — Assessment & Plan Note (Signed)
[facility]                         GASTROENTEROLOGY OFFICE NOTE   Mary, Acosta                       MRN:          [id number]  DATE:08/18/2007                            DOB:          10-15-1955    [name] is an extremely complex woman as per my detailed note of  July 21, 2007.  She has multiple general medical and  gastrointestinal complaints, and has had multiple radiographic  procedures and has seen multiple physicians over the years.  She  currently is on morphine 15 to 30 mg every 4 hours for reasons which are  somewhat unclear, but I think from peripheral neuropathy and polymyalgia  rheumatica.  In any case, we have been trying to schedule endoscopy and  colonoscopy in this patient who has had previous gastric banding, but  have been unsuccessful because of her continued chest pain, coughing,  syncopal episodes, and episodes of spells..  One of these episodes  occurred while she was being instructed in her endoscopy and  colonoscopy, and she had to go to the emergency room and see Dr. Timothy Lasso.   She has chronic bronchitis and has been on prednisone and Levaquin for  the past week and still is short of breath and is having cough and  sputum production.  She saw Dr.  Jetty Duhamel in August of 2006 and he  felt that she had sleep apnea which was compounded by her cardiovascular  issues and her continued smoking habit.  She also has severe exogenous  obesity, and gastroesophageal reflux disease.  She was on Aciphex but  she discontinued this medication because she said it did not work.  The patient additionally is an insulin-dependent diabetic, has  hypertension, and has chronic psychiatric difficulties, and is on  clonazepam 1 mg t.i.d., nortriptyline 50 mg at bedtime, Provigil 200 mg  a day, and lisinopril 40 mg a day in addition to her morphine and  Synthroid.   She, today, is somewhat short of breath and has some facial  distortion,  but I cannot appreciate anydefinite movement disorder.  Her chest shows  diffuse wheezes and rhonchi in both lung fields with poor air movement.  She was tachycardiac, but I could not appreciate any murmurs, gallops or  rubs.  She weighted 290 pounds and blood pressure 128/78, and pulse was  90 and regular.   ASSESSMENT:  [name] has multiple medical problems as mentioned  above, and I am sure that she is not a candidate for endoscopy and  colonoscopy with routine conscious sedation.  She will need propofol  administration by nurse anesthetist to complete these procedures.  As  from previous notes, she has had a lap-band for obesity surgery, and  also a truncal vagotomy and is status post cholecystectomy.  She also  has fatty infiltration of her liver, in addition to her multiple other  medical problems.  She is not stable from a cardiovascular and pulmonary  stand point at this time for endoscopic exam and colonoscopy.   RECOMMENDATIONS:  1. I have set her up an appointment to see Dr. Fannie Knee  to see if      there is anything else we can do for pulmonary complaints.  2. If she is cleared for endoscopic procedure, this will need to be      done in a hospital setting with nurse anesthetist in attendance.  3. I have restarted a reflux regimen and twice a day Prevacid and Solu-      Tabs 30 mg.  4. I wonder if this patient is not best served by referral to a Pain      Clinic for her multiple medication management.   It is also in her record that she has an appointment for [facility] for neurologic evaluation, but I do not have any copy of these  records.  As mentioned previously, Dr. Timothy Lasso is her primary care  physician.     Mary Rea. Jarold Motto, MD, Caleen Essex, FAGA  Electronically Signed    DRP/MedQ  DD: 08/18/2007  DT: 08/18/2007  Job #: [id number]   cc:   Gwen Pounds, MD  Clinton D. Maple Hudson, MD, FCCP, FACP

## 2011-01-08 NOTE — Procedures (Signed)
NAME:  Mary Acosta, Mary Acosta NO.:  [id number]   MEDICAL RECORD NO.:  [id number]          PATIENT TYPE:  REC   LOCATION:  TPC                          FACILITY:  [facility]   PHYSICIAN:  Erick Colace, M.D.DATE OF BIRTH:  26-Mar-1956   DATE OF PROCEDURE:  DATE OF DISCHARGE:                               OPERATIVE REPORT   PROCEDURE:  Left sacroiliac injection under fluoroscopic guidance.   INDICATIONS:  Left sacroiliac disorder.  Has had previous relief with  injections, last injection done 6 months ago, has worn off.  The pain is  only partially responsive to medication management, including narcotic  analgesic medications, and interferes with household duties and shopping  as well as walking.   Informed consent was obtained after describing risks and benefits of the  procedure to the patient.  These include bleeding, bruising infection,  temporary or permanent left lower extremity paralysis.  She elects to  proceed and has given written consent.  The patient placed prone on  fluoroscopy table.  Betadine prep, sterile drape.  A 25-gauge inch and a  half needle was used to anesthetize skin and subcu tissues with 1%  lidocaine x3 mL.  Then a 25-gauge 3-1/2-inch spinal needle was inserted  into the left sacroiliac joint under AP and lateral imaging.  Omnipaque  180 x 0.75 mL demonstrated a good joint outline with no intravascular  uptake.  Then a solution containing 1 mL of 2% MPF lidocaine and 0.5 mL  of 40 mg/mL Depo-Medrol were injected.  The patient tolerated the  procedure well.  Pre and post injection vitals stable.  Post procedure  instructions given.  Pre injection pain level was 7/10, post injection 5  and dropping.  Will follow up in 1 month in an office visit.      Erick Colace, M.D.  Electronically Signed     AEK/MEDQ  D:  10/22/2007 13:19:27  T:  10/23/2007 11:21:41  Job:  [id number]   cc:   Dr. Loma Boston [facility]  Department of Neurology

## 2011-01-08 NOTE — Assessment & Plan Note (Signed)
A 55 year old female with chronic back pain since 1995, left greater  than right knee pain status post left total knee replacement in May of  2008.  She has anterolisthesis of L4 and L5 but stable with flexion and  extension.  She has had some improvement of her back pain with medial  branch radiofrequency procedures in May of 2008.  She is approximately  six months post now.  Her back pain has been gradually increasing since  I last saw her on June 22, 2007.  She has had some improvement with  left ankle joint injection per Lenn Sink, D.P.M.  She is seeing  neuromuscular neurologist for evaluation of leg cramping in her thighs.   Her pain is rather constant.  Her sleep is poor.  Her pain improves with  medication and increases with activity as well as prolonged sitting.   REVIEW OF SYSTEMS:  Positive for numbness and tingling, trouble walking  and depression, suicidal thoughts but no active plan.  She was just seen  by PHD level psychologist yesterday who stated that she was not suicidal  or having any signs of bipolar disorder, but that she had significant  depression and he recommended psychiatric evaluation.  Positive for  nausea, diarrhea, constipation, sleep apnea problems.   PHYSICAL EXAMINATION:  VITAL SIGNS:  Blood pressure 129/86, pulse 86,  respirations 20, O2 saturation 98% on room air.  GENERAL:  No acute distress.  Mood and affect appropriate.  BACK:  No tenderness to palpation except at the lumbosacral junction.  She has pain exacerbated by extension of the lumbar spine and somewhat  improved by flexion.  EXTREMITIES:  Legs have 1+ pitting edema.  Her strength is normal in the  proximal lower extremities, but 4 at the ankle dorsiflexion and plantar  flexion.  Gait shows no evidence of toe drag or knee instability.   IMPRESSION:  1. Chronic back pain, facet arthropathy.  It appears that the medial      branch neurotomy may be wearing off, ie, she is  re-enervating.  2. Ankle pain.  She likely has tibiotalar arthropathy.  3. Knee osteoarthritis, history of total knee replacement in May of      2008.  4. Spondylolisthesis of the lumbar spine.   PLAN:  1. We will continue her Nortriptyline at 50 mg q.h.s.  2. Increase MS Contin CR to 15 mg t.i.d. and continue MSIR one to two      tablets, but 30 for a month.  3. I will see her back in one months' time.      Erick Colace, M.D.  Electronically Signed     AEK/MedQ  D:  07/20/2007 16:31:07  T:  07/20/2007 23:59:00  Job #:  [id number]   cc:   Lenn Sink, D.P.M.  Fax: [phone]   Gwen Pounds, MD  Fax: [phone]

## 2011-01-08 NOTE — Assessment & Plan Note (Signed)
[name] returns today, she has chronic low back pain which has had  some good response to left sided L3-L4 medial branch neurotomy as well  as L5 dorsal ramus neurotomy.  Her main complaint at the current time  includes her feet, but not so much the burning pain in her feet but  lateral foot pain on the left side, greater than the right side.  She  states she has had injections in the past for this.  She also is  concerned about her side pain.  She has slight cramping and has been  treated with steroids in the past for this despite sed rates in the 30s,  she has been told she has had polymyalgia rheumatica.   Her pain currently 8 out of 10 noted in the sides and lateral feet and  to a lesser extent low back.  Sleep is poor.  She can walk 2-3 minutes  at a time.  She does not really climb steps well.  Please note she did  have left total knee arthroplasty on Jan 23, 2007.   REVIEW OF SYSTEMS:  Numbness, tremor, trouble walking, dizziness,  confusion, depression/anxiety.  She has had some passive suicidal  thought but no active plans.  Has had some problem with her weight and  with blood sugars related to her diabetes.   Blood pressure 151/100, pulse 98, respirations 18, O2 sat 96% room air.   Appropriate affect, normal mood.  Gait favors the left lower extremity  slightly, but otherwise without evidence of toe drag or knee  instability.  BACK:  No tenderness to palpation.  FEET:  Have tenderness in the lateral borders of the foot bilaterally  but certainly worse on the left than on the right.  No pain over the  metatarsal heads.  This is more at the proximal fifth metatarsal.  No  erythema, no skin breakdown or calluses in that area created.   IMPRESSION:  1. Lumbar facet arthropathy, relatively well-controlled status post      lumbar radiofrequency procedures May of 2008.  2. Lateral foot pain, question if this is related to her metatarsal      phalangeal joint, left greater  than right.  Will check x-rays of      feet.  This certainly may be somewhat asymmetric involvement of her      neuropathy which could be more of a mononeuropathy multiplex.  3. Thigh pain, rule out myositis, will check EMG.  I will see her back      for the EMG and then check foot films if she has had them done      already.   In the meantime her urine drug screen was appropriate and will start MS  Contin 15 mg p.o. b.i.d., try to minimize dosages and use other  treatments, consider Lidoderm patch to her feet.      Erick Colace, M.D.  Electronically Signed     AEK/MedQ  D:  05/26/2007 17:41:13  T:  05/27/2007 10:03:46  Job #:  [id number]   cc:   Gwen Pounds, MD  Fax: [phone]

## 2011-01-08 NOTE — Group Therapy Note (Signed)
HISTORY:  A 55 year old female.  This is for the evaluation of chronic  back pain and knee pain.  The patient gives a history of back pain since  1995.  She has been followed for at least 3 years at [facility].  She has had chronic back pain as noted for greater than 10  years.   PERTINENT MEDICAL HISTORY:  1. History of chest pain which resulted in a cardiac catheterization      which was normal.  2. Obstructive sleep apnea, on CPAP.   PAST SURGICAL HISTORY:  1. Thyroidectomy for bilateral thyroid nodules.  This was in 2007.  2. She has also had Lap-Band procedure on February 25, 2006.  3. More recently, she has undergone a left total knee arthroplasty for      severe degenerative joint disease in the left knee.  This was      performed by Dr. Magnus Ivan on Jan 23, 2007.  She had no      postoperative complications and has finished up on her Coumadin.   In terms of her low back, she has had a CT of the lumbar spine performed  March 04, 2007 demonstrating a grade 1 anterolisthesis L4 on L5, 10 mm,  stable with flexion and extension.  No significant central stenosis.  She had mild facet hypertrophy of L3-4, moderate L5-S1, and severe at L4-  5.  She had left L4-5 facet injections performed on September 16, 2006 at  [facility].  She underwent medial branch blocks showing a  positive response at L3 medial branch, L4 medial branch, and L5 dorsal  ramus.  This was performed Dec 30, 2006.   OTHER MEDICATIONS TRIED OVER THE YEARS:  1. Percocet.  2. Paxil.  3. OxyContin up to 20 mg b.i.d.  4. Neurontin 300 mg up to t.i.d.  5. Darvon.  6. Darvocet.  7. Vicodin.  8. Prednisone.  9. MS Contin 30 b.i.d., with morphine sulfate immediate release      breakthrough b.i.d., 15 mg.  These have been all trialed between      2005 and 2007.  10.She has also been tried on Lyrica 225 b.i.d.  11.Skelaxin 800 t.i.d.  12.Tramadol 50 q.i.d.  13.Clonazepam 0.5 b.i.d.  14.Flexeril 10 mg b.i.d.  in 2008.   She has had ESR mildly elevated at 36.  Urate and rheumatoid factor  normal.   ADDITIONAL DIAGNOSIS:  Patellofemoral problems in the knees.   She has had rheumatologic evaluation in 2006 per Dr. Corliss Skains.  She has  had nerve conduction study per Dr. Neale Burly showing bilateral median  mononeuropathy at the wrist, moderate carpal tunnel syndrome, mild left  ulnar neuropathy, Jan 01, 2006.   She has gone through physical therapy Jan 13, 2006 at [facility].   PAST MEDICAL HISTORY:  1. Diabetes, managed by Dr. Timothy Lasso at [facility].  2. Diagnosed as cushingoid, seen by Dr. Talmage Nap from endocrinology.  3. Diagnosed with type 2 diabetic neuropathy.   She has also been seen by [facility] for atypical chest pain,  costochondritis versus fibromyalgia-type pain.   Her last basic metabolic panel showed estimated GFR greater than 60.  Did have an elevated white count of 20 at that time.   In addition, she has had injection of her sacroiliac joint in 2008 which  was helpful for her but was quite painful, per her recollection.   Her current pain is around a 7-8 range, increasing with activity to  10/10.  Any  type of activity or prolonged sitting seems to make her pain  worse.  She can walk 3-4 minutes at a time.  She climbs steps.  She  drives.  She indicates she needs some assistance with meal preop,  household duties, and shopping.  She last was employed on April 2008.  She complains of tingling in her hands, trouble walking, anxiety, and  depression, but negative for suicidal thoughts.  Some nausea, diarrhea,  and high and low blood sugars.   SOCIAL HISTORY:  She is married.  Smokes a pack a day.   FAMILY HISTORY:  Heart disease, diabetes, high blood pressure,  disability.   PHYSICAL EXAMINATION:  VITAL SIGNS:  Blood pressure 96/56, pulse 84,  respirations 18, O2 saturation 96% on room air.  GENERAL:  Obese female.  Emotionally labile.  Tends to cry.   Oriented  x3.  Affect is bright.  MUSCULOSKELETAL:  Gait is normal.  She has good strength in bilateral  upper and lower extremities.  She has a healed left total knee scar.  She has good range of motion in the upper extremities.  However, she  does have some decreased knee flexion on the left side.  She has a  negative straight leg raise test.  Her spine shows no evidence of  scoliosis.  She does have pain with extension greater than with flexion.  Faber's is difficult to do on the left side due to knee pain.  She has  decreased hip internal and external rotation bilaterally.  Gait is  without evidence of toe drag or knee instability.  Sensation is normal.   IMPRESSION:  1. Lumbar facet syndrome.  Chronic low back pain.  Has had left-side      L3 and L4 medial branch neurotomies as well as L5 dorsal ramus      neurotomy.  She does have some SI-related pain still.  2. Diabetic neuropathy.  Has trialed Lyrica, Cymbalta, Topamax, and      Neurontin.  She has not tried nortriptyline, per her recollection,      and will start with a 10 mg dose, gradually increasing by 10 mg per      week.  I will see her back in 2 weeks.  Will check urine screen.      She is not reporting taking any narcotic analgesics.  States she      came off of them after her surgery.  Overall, would like to      minimize narcotic analgesics, perhaps a combination of tramadol and      nortriptyline.  Consider physical therapy more directed toward      spinal stabilization.  Consider a TENS unit.   Thank you very much for this interesting consultation.      Erick Colace, M.D.  Electronically Signed     AEK/MedQ  D:  05/12/2007 16:09:16  T:  05/13/2007 07:55:10  Job #:  [id number]   cc:   Gwen Pounds, MD  Fax: [phone]   Lillia Carmel, M.D.  Fax: [phone]

## 2011-01-08 NOTE — Procedures (Signed)
NAME:  DELLAMAE, ROSAMILIA NO.:  [id number]   MEDICAL RECORD NO.:  [id number]          PATIENT TYPE:  REC   LOCATION:  TPC                          FACILITY:  [facility]   PHYSICIAN:  Erick Colace, M.D.DATE OF BIRTH:  April 03, 1956   DATE OF PROCEDURE:  12/17/2007  DATE OF DISCHARGE:                               OPERATIVE REPORT   PROCEDURE PERFORMED:  Left sacroiliac injection under fluoroscopic  guidance.   INDICATIONS:  Sacroiliac disorder, prior good relief with sacroiliac  injection, pain is only partially responsive to narcotic analgesic  medications and interferes with self-care and mobility.   PROCEDURE IN DETAIL:  Informed consent was obtained after describing the  risks and benefits of the procedure to the patient.  These include  bleeding, bruising, infection, lower extremity weakness.  She elects to  proceed and has given written consent. The patient was placed prone on  the fluoroscopy table.  Betadine prep, sterile drape.  A 25 gauge 1 1/2  inch needle was used to anesthetize the skin and subcutaneous tissue  with 1% lidocaine x2 mL.  Then, a 22 gauge 3 1/2 inch spinal needle was  inserted under fluoroscopic guidance targeting the left sacroiliac  joint, AP, lateral and oblique imaging utilized.  Omnipaque 180 x 0.5 mL  demonstrated no intravascular uptake then 0.5 mL of a solution  containing 40 mL per mL Depo-Medrol plus 1 mL of 2% MPF lidocaine were  injected after negative drawback for blood.  The patient tolerated the  procedure well.  Pre-injection pain level 8/10.  Post injection 4/10.      Erick Colace, M.D.  Electronically Signed     AEK/MEDQ  D:  12/17/2007 16:55:10  T:  12/17/2007 18:39:20  Job:  [id number]

## 2011-01-08 NOTE — Op Note (Signed)
NAME:  EDLA, PARA NO.:  [id number]   MEDICAL RECORD NO.:  [id number]          PATIENT TYPE:  INP   LOCATION:  2550                         FACILITY:  [facility]   PHYSICIAN:  Vanita Panda. Magnus Ivan, M.D.DATE OF BIRTH:  [birth year]   DATE OF PROCEDURE:  DATE OF DISCHARGE:                               OPERATIVE REPORT   PREOPERATIVE DIAGNOSIS:  Left knee severe degenerative joint disease.   POSTOPERATIVE DIAGNOSIS:  Left knee severe degenerative joint disease.   PROCEDURE:  Left total knee arthroplasty.   SURGEON:  Doneen Poisson, MD   ASSISTANT:  Wende Neighbors, P.A.   ANESTHESIA:  Left leg femoral nerve block   ANESTHESIA:  1. Left leg femoral nerve block.  2. General anesthesia.   ANTIBIOTICS:  2 grams IV Ancef.   IMPLANTS:  DePuy Sigma rotating platform knee, size 4 femur, size 3  tibia with 10 mm polyethylene insert and 35-mm patellar button.   TOURNIQUET TIME:  2 hours 15 minutes.   BLOOD LOSS:  100 mL.   COMPLICATIONS:  None.   INDICATIONS:  Briefly, [name] is a 55 year old diabetic with  polymyalgia rheumatica who was on chronic steroids and chronic pain  medication.  I had performed arthroscopic surgery of the knee before for  degenerative joint disease of that knee and severe wear.  She  continued  to have chronic pain in his knee in spite of conservative therapy.  That  greatly affecting activities of daily living.  I talked to her about the  possibility total knee replacement.  She wished to proceed with this.  I  talked her primary care physician and her cardiologist about this as  well.  She understood the high risk of infection given her chronic  steroid use.  She understood this and said that she still wanted to  proceed with surgery because her knee was hurting her so bad this was  affecting her ability to get around.  She did fully understood the risks  and benefits of surgery.  I felt that was located proceed with  surgery  and she was duly informed of these risks.   DESCRIPTION OF PROCEDURE:  Informed consent was obtained and the  appropriate left leg was marked and a femoral block was obtained by  anesthesia.  She was then brought to the operating room and placed on  the operating table.  General anesthesia was obtained.  Foley catheter  was in place and a nonsterile tourniquet was placed around her upper  left thigh.  The leg was prepped and draped with DuraPrep and sterile  drapes including sterile stockinette.  An Esmarch was used to wrap out  the leg and tourniquet was inflated to 350 mmHg pressure.  She was then  identified as the correct patient and the  correct extremity.  I made a  midline incision directly over the patella and carried this proximally  and distally.  I then divided the peritenon and the soft tissue of the  knee.  I took a medial parapatellar approach.  Large effusion was  encountered in the need.  There was a bony arthritis with bone-on-bone  wear of the medial side, the lateral side, the trochlear groove and  underneath the patella.  I then removed the fat pad from the knee,  removed osteophytes from the femur, the tibia and around the patella.  After this was done, I placed two Steinmann pins and the tibia and two  Steinmann pins.  I was able to use computer navigation for the knee and  both pin were placed in an anteromedial, posterolateral direction for  computer navigation of the knee.  Once the arrays were placed in the  femur and the tibia, I  proceeded with the navigation portion.  Points  were selected throughout the femur and tibia for navigation portion.  Next, the knee flexed and traction placed.  I performed a tibia cut.  After tibia cut was made, a distal femoral cut was made.  Next, anterior  and chamfer cuts of the femur were made.  Soft tissue balancing guides  were used with the knee in flexion and extension to help maintain  balance in alignment and all  these were made in accordance to the  computer navigation.  Once all these bone cuts were made, trial  components were placed, the size 4 femur and the size 3 tibia and 10 mm  insert.  I put the knee through a range of motion.  The insertion found  to be stable.  The patellar cut was made and a size 35 patellar button  was placed.  Again, the knee was put through range of motion with the  inserts in place. Soft tissue  balancing was also done to maintain a  near perfect alignment of the knee.  There was neutral extension as  well.  All components were then removed and I thoroughly irrigated the  knee with normal saline. A mixture of cement was placed in and this  mixture was seated with a tobramycin antibiotic in the cement.  The knee  was cleaned with pulsatile lavage solution and normal saline to help  clean out the knee joint itself.  After this was accomplished, the knee  was dried.  The tibial tray was cemented down followed by the femoral  femoral component.  I then placed the real 10 mm  polyethylene rotating  platform insert.  Next, the patellar button was cemented as well.  Once  the cement was allowed to dry, we removed excess cement debris, put the  knee through range of motion and it was found to be stable.  Tourniquet  was let down after 2 hours and 15 minutes.  Hemostasis was obtained with  electrocautery.  A medium Hemovac was placed.  The parapatellar  arthrotomy was closed with interrupted #1 Vicryl suture followed by 2-0  Vicryl in the subcutaneous tissue and staples on the skin.  The  steinmann pins were removed from the navigation portion of the case as  well and the sites were closed with interrupted 3-0 nylon suture.  Xeroform followed by well-padded sterile dressing and knee immobilizer  were placed and the patient's foot remained well-perfused after the  case.  Postoperatively we will allow her to weight bear as tolerated depending on the findings of the films and  we will start a therapy process with  CPM as well.      Vanita Panda. Magnus Ivan, M.D.  Electronically Signed     CYB/MEDQ  D:  01/23/2007  T:  01/23/2007  Job:  [id number]

## 2011-01-08 NOTE — Assessment & Plan Note (Signed)
Mary Acosta                         GASTROENTEROLOGY OFFICE NOTE   Mary, Acosta                       MRN:          [id number]  DATE:07/21/2007                            DOB:          March 20, 1956    MAIN COMPLAINT:  [name] is a 55 year old white female referred for  evaluation of atypical chest pain by Dr. Creola Corn.   HISTORY OF PRESENT ILLNESS:  This is a very complicated patient who has  atypical chest pain running from her jaw areas bilaterally down the  right side of her neck into her right chest and out through her right  breast area, also posteriorly into her back.  She describes this as a  sharp pain, has no real precipitating elements but will last 30-45  minutes in duration and occurs daily and is only relieved by drinking a  glass of milk.  There is some mild associated dysphagia and odynophagia.  She denies true acid reflux symptoms.  Her history is complicated by Lap-  Band obesity surgery along with truncal vagotomy per Dr. Wenda Low on  February 25, 2006.  She is also status post cholecystectomy by Dr. Carolynne Edouard, had  a negative intraoperative cholangiogram in October of 2003.  Patient  relates that she has been told I have a fatty liver, but I do not have  any information concerning this abnormality.  She really denies  abdominal pain.  She follows a frequent small feeding diet but has been  unable to lose weight because she is on rather large doses of  corticosteroids because of a history of polymyalgia-polymyositis.   Patient has alternating bowel habits, is currently constipated because  of daily morphine 15-30 mg every 4 hours per [facility].  She goes to pain clinic because of severe neuropathic pain in her legs.  On reviewing her records from Dr. Timothy Lasso, she had ultrasound of the  abdomen on August 29, 2002.  Dr. Jillyn Hidden Fischer described an ovoid  sonodense structure in the body of the pancreas measuring 1 x 2 x  3  sonometers.  CT scan was recommended but I do not think it was  completed.  In any case, the patient denies a history of pancreatitis  and currently does not have epigastric pain, nausea and vomiting.  She  is on a frequent small feeding diet, denies any specific food  intolerances.  She denies fever, chills, clay colored stools, dark  urine, icterus.  She has not had any radiographic studies since her  surgical procedures.   PAST MEDICAL HISTORY:  She has a very involved past medical history  involving insulin-dependent diabetes with what sounds like peripheral  neuropathy.  She also has what she describes as polymyalgia  rheumatica, and she takes large doses of steroids, up to 20 mg a day.  Her severe leg pain continues to stay despite steroids and apparently  she has been referred to [facility].  She says she chronically runs a very elevated sed rate.  She currently  is going to the pain clinic and is on narcotics as  mentioned above.  She  also has chronic edema of her lower extremities and states this is  dermatitis.  She also has chronic foot pain and goes to a podiatrist.  She has had left knee replacement by Dr. Magnus Ivan (Orthopedics).  Patient additionally has hypertension, asthmatic bronchitis,  hypercholesterolemia, chronic thyroid dysfunction with previous thyroid  surgery per Dr. Carolynne Edouard.  She says there is some question as to whether or  not she had thyroid malignancy.  She has chronic anxiety and depression,  sleep apnea and in addition to her other surgeries has had a lumpectomy  of her breast, appendectomy, tonsillectomy and tubal ligation.   MEDICATIONS:  1. Morphine 15-30 mg every 4 hours.  2. Lisinopril 40 mg a day.  3. Lantus insulin 75 units each morning.  4. Provigil 200 mg a day.  5. Synthroid 175 mcg a day.  6. Prednisone 20 mg a day.  7. Clonazepam 1 mg t.i.d.  8. Nortriptyline 50 mg at bedtime.  9. She uses a fair amount  of Phenergan.  10.P.r.n. Lasix.   SHE IN THE PAST HAS HAD REACTIONS TO SULFUR AND ERYTHROMYCIN.   FAMILY HISTORY:  Remarkable for atherosclerotic coronary vascular  disease and diabetes in multiple family members.  She has an aunt who  had breast cancer.   SOCIAL HISTORY:  She is married and lives with her husband and has some  college education and works as a Advertising copywriter.  She does smoke but  denies ethanol abuse.   REVIEW OF SYSTEMS:  Remarkably positive for many complaints including  peripheral edema, diffuse polyarticular arthralgias, recurrent skin  rashes, insomnia, excessive thirst, visual difficulties, chronic back  pain, worsening anxiety and depression, chronic fatigue, shortness of  breath with exertion.  She does carry a diagnosis of COPD.  As mentioned  above she has chronic leg pain and has had previous knee replacement  surgery.   EXAMINATION:  She is a chronically ill-appearing white female appearing  older than her stated age.  She is 5 foot 10 and weighs 276 pounds,  blood pressure 138/88 and pulse was 96 and regular.  I could not  appreciate stigmata of chronic liver disease.  There was no thyromegaly  or masses in her neck.  Examination of the oropharyngeal area was  normal.  CHEST:  Entirely clear to percussion and auscultation.  She was in a regular rhythm without murmurs, gallops or rubs.  She had  exogenous obesity in her abdomen with mild hepatomegaly.  There was a  hard, firm area in the epigastric area that was circular and slightly  moveable, consistent with apparently where her Lap-Band was placed.  I  could not appreciate splenomegaly, other abdominal masses or tenderness.  Bowel sounds were normal and I could not appreciate a succession splash.  There was +1 peripheral edema with stasis dermatitis but no swollen,  tender joints or evidence of phlebitis.  Mental status was clear.   ASSESSMENT:  1. Atypical chest pain in a very complex lady who  has multiple medical      problems and is on multiple medications including chronic steroids      and chronic narcotics.  Also, she is status post Lap-Banding of her      stomach and has had some success in losing weight.  Considerations      for chest pain would be atypical acid reflux, esophageal spasm,      candida esophagitis, etc.  2. Status post cholecystectomy with abnormal ultrasound  from 2004      suggesting a mass in the pancreas.  This definitely needs followup      ultrasound/CT scan.  3. History of obesity and fatty infiltration of the liver.  4. Chronic irritable bowel syndrome alternating diarrhea and      constipation, currently constipated on narcotics.  Has never had      screening colonoscopy.  5. Insulin-dependent diabetes with severe peripheral neuropathy.  6. History of polymyalgia rheumatica versus polymyositis, currently      being evaluated.  She is on high doses of steroids.  7. Chronic pain syndrome requiring narcotics through the pain center.  8. Status post left knee replacement.  9. HISTORY OF SULFUR ALLERGY.  10.Status post partial thyroidectomy, possible thyroid cancer.  11.Severe stasis dermatitis of her lower extremities.  12.Chronic insomnia, on Provigil.  13.History of possible sleep apnea related to obesity.  14.Chronic anxiety and depression.   RECOMMENDATIONS:  1. Repeat some lab data including sed rate, amylase, lipase and celiac      panel.  2. Repeat abdominal/pelvic CT scan.  3. Empiric treatment with Aciphex 20 mg a day until workup completed.  4. Outpatient endoscopy and colonoscopy.  5. If patient does have elevated liver enzymes or workup consistent      with fatty liver she should probably be on Actos therapy.  6. Consider followup with Dr. Jetty Duhamel for her sleep apnea with      sleep apnea workup in August of 2006.  7. Patient should probably keep her appointment at [facility]      for evaluation of her possible  polymyositis.   ADDENDUM:  The patient did have endoscopic exam in our office in January  of 2004 which was unremarkable.     Vania Rea. Jarold Motto, MD, Caleen Essex, FAGA  Electronically Signed    DRP/MedQ  DD: 07/21/2007  DT: 07/21/2007  Job #: [id number]

## 2011-01-08 NOTE — Op Note (Signed)
NAME:  Mary Acosta, Mary Acosta                ACCOUNT NO.:  [id number]   MEDICAL RECORD NO.:  [id number]          PATIENT TYPE:  INP   LOCATION:  5014                         FACILITY:  [facility]   PHYSICIAN:  Mark C. Ophelia Charter, M.D.    DATE OF BIRTH:  08-Feb-1956   DATE OF PROCEDURE:  08/29/2008  DATE OF DISCHARGE:                               OPERATIVE REPORT   PREOPERATIVE DIAGNOSIS:  Left total knee arthroplasty with progressive  painful ligamentous laxity.   POSTOPERATIVE DIAGNOSIS:  Left total knee arthroplasty with progressive  painful ligamentous laxity.   PROCEDURE:  Revision left total knee arthroplasty, poly revision.   SURGEON:  Mark C. Ophelia Charter, MD   ASSISTANT:  Wende Neighbors, PA-C   ANESTHESIA:  GOT plus Marcaine local.   TOURNIQUET TIME:  Less than 90 minutes.   PROCEDURE:  After induction of general anesthesia orotracheal  intubation, proximal thigh tourniquet application, preoperative  antibiotics, and surgical checklist, completion standard prepping and  draping had been performed with usual sterile sheets and drapes, sterile  skin marker, impervious stockinette, Coban, and Betadine biodrape was  used to seal the skin.  Leg was wrapped in Esmarch.  Tourniquet  inflated.  Old incision was made.  Medial true retinacular incision was  made and extended proximally and soft tissue was removed.  There was  clear yellow fluid present.  White thin synovial lining present and this  was turned back.  There was some scar tissue anteriorly underneath the  patellar tendon, which was removed for exposure and continued dissection  for mobilization.  The patient had a total knee arthroplasty done by Dr.  Magnus Ivan and had been doing well, but gradually had gotten more laxity  with increased pain anterolateral with bone scan showing no evidence of  loosening and the patient having problems getting from sitting to  standing with increased medial and lateral laxity as well as AP laxity  with  time.  She does have some known back degenerative changes, which  possibly could be a contributing factor.  She also has a history of  hyperglycemia and uses insulin intermittently.   Once narrow Hohmann retractors were able to be placed posteriorly with  care to protect the joint surface, poly was removed by using the  osteotome cutting across the post and then removing the post with 15  scalpel blade.  Poly was a four 10 mm.  A trial of 15 showed excellent  stability by going up 2 sizes.  This fixed the medial and lateral laxity  in both flexion and extension and also significantly improved the  anterior drawer with AP instability the patient was developing.  Collateral ligaments were intact and her history reviewed that gradually  after the surgery, the ligaments had gotten a little bit more loose  until it progressed to the point where she became increasingly  symptomatic.   Fifteen permanent poly was then placed.  The knee reached full  extension.  There was excellent correction of laxity and the poly was  inserted with some difficulty as expected.  Femoral component and tibial  component were  tight.  No evidence of looseness.  Some synovial tissue  was sent for culture, but there was no evidence of infection.  The  patient had had preoperative aspirate as well as bone scan, sed rate,  and CRP; all showed no evidence of total joint arthroplasty infection.  Pulsatile lavage was used followed by standard layered closure with  Ethibond interrupted in  the deep layer, 2-0 Vicryl in subcutaneous tissue, superficial  retinaculum, and then subcuticular skin closure, postop dressing,  Marcaine infiltration, Steri-Strips, and transferred to the recovery  room in stable condition.  Instrument count and needle count was  correct.  Surgical checklist was completed.  Case was as posted class I.      Mark C. Ophelia Charter, M.D.  Electronically Signed     MCY/MEDQ  D:  08/29/2008  T:  08/30/2008   Job:  [id number]

## 2011-01-08 NOTE — Procedures (Signed)
NAME:  Mary Acosta, Mary Acosta                ACCOUNT NO.:  [id number]   MEDICAL RECORD NO.:  [id number]          PATIENT TYPE:  REC   LOCATION:  TPC                          FACILITY:  [facility]   PHYSICIAN:  Erick Colace, M.D.DATE OF BIRTH:  November 01, 1955   DATE OF PROCEDURE:  DATE OF DISCHARGE:                               OPERATIVE REPORT   This is a left L5 dorsal ramus injection and radiofrequency neurotomy,  left L4 medial branch block and radiofrequency neurotomy, left L3 medial  branch block and radiofrequency neurotomy, under fluoroscopic guidance.   INDICATIONS:  Lumbar facet-mediated pain only partially relieved with  medication management and other conservative care.  Pain is interfering  with her walking.   Informed consent was obtained after describing the risks and benefits of  the procedure to the patient.  These include bleeding, bruising,  infection, loss of bowel or bladder function, temporary or permanent  paralysis.  She elected to proceed and has given written consent.  The  patient placed prone on fluoroscopy table, Betadine prep, sterile drape.  A 25-gauge inch and half needle was used to anesthetize skin and subcu  tissue with 1% lidocaine x2 mL.  Then a  20-gauge, 15-cm RF needle with  a 10-mm straight active tip was inserted under fluoroscopic guidance,  first starting at the left S1 SAP-sacral ala junction, bone contact  made, confirmed with lateral imaging.  Sensory Stim at 50 Hz followed by  Motor Stim at 2 Hz confirmed needle location, followed by injection of a  solution containing 1 mL of 4 mg/mL dexamethasone and 2 mL of 1% MPF  lidocaine, 1 mL was injected followed by radiofrequency lesioning, 70  degrees x70 seconds.  Then the left L5 SAP-transverse process junction  targeted, bone contact made, confirmed with lateral imaging.  Sensory  Stim at 50 Hz followed by Motor Stim at 2 Hz confirmed proper needle  location, followed by injection of 1 mL of the  dexamethasone-lidocaine  solution and RF lesioning at 70 degrees x70 seconds.  Then the left L4  SAP-transverse process junction targeted, bone contact made, confirmed  with lateral imaging.  Sensory Stim at 50 Hz followed by Motor Stim at 2  Hz confirmed proper needle location, followed by injection of 1 mL of  the dexamethasone-lidocaine solution and RF lesioning, 70 degrees x70  seconds.  The patient tolerated the procedure well.  Pre and post  injection vitals stable.  Post injection instructions given.  Will  follow up in 1 month.   ADDENDUM:  The patient did increase her nortriptyline on her own to 100  mg.  We will a check nortriptyline  level.      Erick Colace, M.D.  Electronically Signed     AEK/MEDQ  D:  09/07/2007 13:18:07  T:  09/08/2007 06:36:37  Job:  [id number]

## 2011-01-08 NOTE — Assessment & Plan Note (Signed)
HISTORY OF PRESENT ILLNESS:  Mary Fus returns today.  She is having pain  on her back, in her legs, shoulder, and buttocks.  Average pain 8/10,  described as constant aching, stabbing, increased activity at 10/10  level.  She has been to several physicians since I last saw her.  She  saw an orthopedist for her hip at [facility].  She has seen  neurosurgeon who ordered MRI of the lumbar spine at [facility] as well  showing a grade 1 anterolisthesis due to degenerative facet disorder.  She does have multilevel facet problems.  No other significant stenosis.  She has seen another back surgeon at [facility], Dr. Henreitta Leber, who  did not recommend surgery.   REVIEW OF SYSTEMS:  Depression, anxiety; suicidal thoughts, but really  no plan, this is more passive; diarrhea; constipation.   PHYSICAL EXAMINATION:  VITAL SIGNS:  Her blood pressure is 144/80, pulse  80, respirations 20, O2 sat 96% on room air.  GENERAL:  She is depressed, anxious, walks without toe drag or knee  instability, but shuffling.  SKIN:  She does have some stasis dermatitis in the lower legs.  EXTREMITIES:  She does have good arterial pulses in the pedal and  posterior tib area.  Her strength is full in bilateral lower  extremities.  She does have a healed scar from the left total knee  replacement.  NEUROLOGIC:  Co-ordination is normal.   IMPRESSION:  Chronic pain syndrome.  She has multifocal pain, which  consists of lumbar spondylosis causing facet syndrome.  She has chronic  postoperative pain from total knee replacement.   She is requesting steroid injection.  She has had multiple steroid  injections, both from podiatrist, orthopedist, as well as myself and  would like to minimize these as much as we can.  She has had good  improvements in her left L4 radiculitis after transforaminal injection  on Jan 14, 2008.  Certainly, does need another one of these.   In terms of her medications, she would like to go  up; however, I do not  think given her psychiatric comorbidities that going up would  necessarily help, and certainly puts her at risk as well.  She is  already on clonazepam per primary care and this can also interact with  her MS Contin, thus far no problems with excessive sedation or misuse.   We will continue MS Contin 30 mg b.i.d.  We will continue MSIR 15 mg  t.i.d.  She requests a muscle relaxant.  I really do not think this  would add much and would only use this if she is having a particular  flare up, which she really is not at this point.   She is continuing on a prednisone taper.  She is on 8 mg.  She has  questionable history of polymyalgia rheumatica.   I will see her back in a month's time.  I discussed that really there is  no simple fix to her pain, but overall increasing her activity level  should be helpful.  She may benefit from aquatic exercise.   She does have an appointment with Rheumatology to evaluate for  polyarticular arthritis.  She plans on keeping that appointment and she  states that she has some nausea with her morphine.  I have given her a  prescription for Phenergan 12.5 one p.o. t.i.d. p.r.n.  She states that  she has been on this through her primary physician.  I warned her  of the potential interaction.  She states she has already been taking it  with no problem with it thus far.  I will see her back at followup.      Mary Acosta, M.D.  Electronically Signed     AEK/MedQ  D:  03/10/2008 16:50:21  T:  03/11/2008 08:06:50  Job #:  [id number]   cc:   Aundra Dubin, M.D.  [address]  [place]  [place] [address]   Gwen Pounds, MD  Fax: [phone]

## 2011-01-08 NOTE — Procedures (Signed)
NAME:  Mary Acosta, Mary Acosta NO.:  [id number]   MEDICAL RECORD NO.:  [id number]          PATIENT TYPE:  AMB   LOCATION:  ENDO                         FACILITY:  [facility]   PHYSICIAN:  Erick Colace, M.D.DATE OF BIRTH:  1956-05-09   DATE OF PROCEDURE:  01/14/2008  DATE OF DISCHARGE:  11/26/2007                               OPERATIVE REPORT   PROCEDURE:  This is a left L4-5 transforaminal lumbar epidural steroid  injection under fluoroscopic guidance.   INDICATIONS:  Left L4 radiculitis, has L4 on L5 anterolisthesis with  foraminal narrowing.  Pain interferes with mobility, household duties  and shopping and persists despite narcotic analgesic medications.  Informed consent was obtained after describing the risks and benefits of  the procedure with the patient.  These include bleeding, bruising,  infection.  She elected to proceed and has given written consent.   DESCRIPTION OF PROCEDURE:  The patient was placed prone on the  fluoroscopy table.  Betadine prep, sterile drape.  A 25-gauge 1-1/2-inch  needle was used to anesthetize the skin and subcu tissue with 1%  lidocaine x2 mL.  A 22-gauge 5-inch spinal needle was inserted in the  left L4-5 intervertebral foramen.  AP, lateral and oblique imaging  utilized.  Omnipaque 180 x 0.5 mL demonstrated no intravascular uptake,  then 0.5 mL of dexamethasone-lidocaine solution was injected.  This  consisted of 1 mL of 10 mg/mL  dexamethasone and 1 mL of 1% MPF  lidocaine.  The patient tolerated the procedure well.  Pre- and post-  injection vitals stable.  I will see her back in 1 month.  Should she  have good results in terms of her reduction of the knee pain on the left  side, may consider right side.      Erick Colace, M.D.  Electronically Signed     AEK/MEDQ  D:  01/14/2008 13:53:31  T:  01/14/2008 15:03:02  Job:  [id number]

## 2011-01-08 NOTE — Op Note (Signed)
NAME:  Mary Acosta, Mary Acosta NO.:  [id number]   MEDICAL RECORD NO.:  [id number]          PATIENT TYPE:  AMB   LOCATION:  SDC                           FACILITY:  [facility]   PHYSICIAN:  Osborn Coho, M.D.   DATE OF BIRTH:  May 02, 1956   DATE OF PROCEDURE:  08/19/2007  DATE OF DISCHARGE:                               OPERATIVE REPORT   PREOPERATIVE DIAGNOSIS:  Postmenopausal bleeding.   POSTOPERATIVE DIAGNOSIS:  Postmenopausal bleeding.   PROCEDURE:  1. Hysteroscopy.  2. Dilation and curettage.   ATTENDING:  Osborn Coho, M.D.   ANESTHESIA:  General via LMA.   FINDINGS:  Uterus sounded to 7 cm.   SPECIMENS TO PATHOLOGY:  Endometrial curettings.   FLUIDS:  Eight hundred mL.   URINE OUTPUT:  Quantity sufficient via straight cath prior to the  procedure.   ESTIMATED BLOOD LOSS:  Minimal.   COMPLICATIONS:  None.   PROCEDURE IN DETAIL:  The patient was taken to the operating room after  the risks, benefits and alternatives reviewed with the patient.  The  patient verbalized understanding and consent signed and witnessed.  The  patient was placed under a general for anesthesia and prepped and draped  in the normal sterile fashion in the dorsal lithotomy position.  A  bivalve speculum was placed in the patient's vagina and a paracervical  block administered using a total of 10 mL of 1% lidocaine.  The anterior  lip of the cervix was grasped with a single-tooth tenaculum and the  cervix was dilated for passage of the hysteroscope.  The uterus sounded  to 7 cm and the hysteroscope introduced and no obvious intracavitary  lesions noted.  Curettage was performed and curettings sent to  pathology.  Hysteroscope was  introduced once again and findings as stated above.  A curettage was  performed one last time and all instruments removed and there was good  hemostasis at tenaculum site.  The count was correct.  The patient  tolerated the procedure well and was currently  awaiting awakening by  anesthesia and transfer to the recovery room.      Osborn Coho, M.D.  Electronically Signed     AR/MEDQ  D:  08/19/2007  T:  08/19/2007  Job:  [id number]

## 2011-01-08 NOTE — Assessment & Plan Note (Signed)
[name] is a 55 year old female who I have [facility] at [facility] May 12, 2007.  She has a history of back pain since 1995.  She has a history of severe  degenerative joint disease left knee and has undergone left knee total  arthroplasty Jan 23, 2007.  CT of the lumbar spine has shown  anterolisthesis grade 1 L4 and L5, 10 mm stable with flexion and  extension.  No significant central stenosis.  She has had mild facet  hypertrophy L3-L4, moderate L5-S1, and severe at L4-L5.  She has had  some response in terms of her back pain to both medial branch blocks and  more recently to radiofrequency.  Her complaints have shifted more from  her back pain, which is now feeling better to her left hip, her wrist,  and knuckles  of her right hand and  the sides of both feet.   She has had EMG and  MCV showing absent sural nerve conduction felt to  be consistent with her history of diabetes.  Because of proximal muscle  pain, she was referred to Neurology at [facility] to see if she may  have some type of myopathy.  She did have some myopathic looking  potentials on EMG, apparently an EMG was repeated, I do not have those  results.  Also, the [facility] neurologist, Dr. Blain Pais, apparently  referred the patient on to Neurosurgery.  MRI of the lumbar spine was  repeated.  The neurosurgeon now has referred the patient to an  orthopedist, Dr. Caswell Corwin for what the patient terms to be hip impingement  on the left side.   The patient has not had any sweats or chills.  No fevers.  She has not  had any weight loss.  Her pain is averaging 8 to 9/10, interfering with  her activity with 10/10 level, described as stabbing constant once again  in multiple locations; medial elbow, wrist and second, and third fingers  left hand.  She can walk 2 to 3 minutes at a time.   REVIEW OF SYSTEMS:  Positive for depression, anxiety, diarrhea,  constipation, and  numbness in the side of feet and trouble walking.   PAST MEDICAL HISTORY:  She has had rheumatologic workup in the past  somewhere around 2006, sed rate mildly elevated at 36.  Urate and  rheumatoid factor were normal.  The patient was reportedly told that  until she comes off of prednisone further rheumatologic workup could not  be performed.  The patient has been on prednisone for several years  because it has been helpful for her multiple joint issues.  She is  trying to get off it.  She does see Endocrinology for Cushing disease.   PHYSICAL EXAMINATION:  VITAL SIGNS:  Blood pressure 145/79, pulse 81,  respirations 18, and O2 saturation 96% on room air.  GENERAL:  No acute distress.  Orientation x3.  Affect is alert.  EXTREMITIES:  Gait is normal.  She does have some boggy swelling in the  right second and third MCP.  She has no swelling or deformities in her  wrist or in her elbows.  She has positive Tinel over the left wrist,  although she is having pain into the little finger rather than into the  median digits.  She has skin discoloration bilateral lower extremities  with chronic edema in the ankles and feet.  She has no evidence of knee  effusion bilaterally.  Her motor strength is 4+/5 bilateral grip, 5  bilateral deltoid and biceps, and 5 bilateral hip flexion and knee  extension, and ankle dorsiflexion.   IMPRESSION:  1. Lumbar axial pain, improved after radiofrequency neurotomy.  2. Left L4 radiculitis, improved after transforaminal injection Jan 14, 2008.  3. Multiple pain listed above.  Question whether she may have an      inflammatory arthritis.  Her metacarpophalangeal is definitely      swollen and looked inflamed today on the right side.  I think it is      worth sending her for rheumatologic evaluation.  Certainly, this is      a difficult patient.  She has multiple issues including depression,      anxiety, which are chronic, and seem to complicate the       interpretation of her pain complaints.  4. We will send her to Dr. Stacey Drain, Rheumatology, here in      town.  I will continue her current medications, which are at least      allowing her to remain active and independent.  These include MS      Contin 30      mg b.i.d., morphine sulfate immediate relief 15 mg t.i.d. p.r.n.      and I will add some Relafen 2 tablets q.a.m. 500 mg for 14 days.      We will continue her on nortriptyline 50 mg 2 p.o. daily.  We will      give her 3 months supply.      Erick Colace, M.D.  Electronically Signed     AEK/MedQ  D:  02/11/2008 16:31:26  T:  02/12/2008 02:47:40  Job #:  [id number]   cc:   Annia Friendly. Loleta Chance, MD  Fax: [phone]   Vanita Panda. Magnus Ivan, M.D.  Fax: [phone]   Aundra Dubin, M.D.  [address]  [place]  [place] [address]   Gwen Pounds, MD  Fax: [phone]   [address] Arnette Schaumann [facility]

## 2011-01-08 NOTE — Procedures (Signed)
NAME:  Mary Acosta, Mary Acosta                ACCOUNT NO.:  [id number]   MEDICAL RECORD NO.:  [id number]          PATIENT TYPE:  REC   LOCATION:  TPC                          FACILITY:  [facility]   PHYSICIAN:  Erick Colace, M.D.DATE OF BIRTH:  [birth year]   DATE OF PROCEDURE:  10/08/2007  DATE OF DISCHARGE:                               OPERATIVE REPORT   PROCEDURE PERFORMED:  Right L5 dorsal ramus injection and radiofrequency  neurotomy, right L4 medial branch block and radiofrequency neurotomy,  right L3 medial branch block and radiofrequency neurotomy under  fluoroscopic guidance.   INDICATIONS FOR PROCEDURE:  Lumbar facet mediated pain.  She has had  good relief with repeat left sided radiofrequency procedure performed  September 08, 2007.  She has pain now shifted toward the right side.  Pain  is only partially responsive to medication management including narcotic  analgesic medications.   PROCEDURE IN DETAIL:  Informed consent was obtained after describing the  risks and benefits of the procedure to the patient.  These include  bleeding, bruising, infection, loss of bowel or bladder function,  temporary or permanent paralysis.  She elects to proceed and has given  written consent.  The patient was placed prone on the fluoroscopy table.  Betadine prep, sterile drape.  A 25 gauge 1 1/2 inch needle was used to  anesthetize the skin and subcu tissue with 1% lidocaine x2 mL.  Then, a  20 gauge 15 cm RF needle with a 10 mm straight active tip was inserted  under fluoroscopic guidance, first starting at the right S1 SAP sacral  ala junction. Bone contact made confirmed with lateral imaging.  Sensory  stem at 50 Hz followed by motor stem at 2 Hz demonstrated proper needle  location followed by injection of 1 mL of a solution containing 1 mL of  4 mg/mL dexamethasone and 2 mL of 1% MPF lidocaine followed by  radiofrequency lesioning at 70 degrees Celsius for 70 seconds.  Then,  the right L5 SAP  transverse process junction was targeted, bone contact  made, confirmed with lateral imaging.  Sensory stem at 50 Hz followed by  motor stem at 2 Hz demonstrated proper needle location followed by  injection 1 mL of the dexamethasone lidocaine solution and  radiofrequency lesioning 70 degrees Celsius for 70 seconds.  Then, the  right L4 SAP transverse process junction targeted, bone contact made,  confirmed with lateral imaging.  Sensory stem at 50 Hz followed by motor  stem at 2 Hz demonstrated proper needle location followed by injection  of 1 mL of the dexamethasone lidocaine solution and radiofrequency  lesioning at 70 degrees Celsius for 70 seconds.  The patient tolerated  the procedure well.  Pre and post injection vitals stable.  Post  injection instructions given.  Pre injection pain level 8/10, post  injection 1/10.  Follow up in two weeks.  She has sacroiliac pain and we  will do injection for that.      Erick Colace, M.D.  Electronically Signed     AEK/MEDQ  D:  10/08/2007 17:43:44  T:  10/10/2007 11:30:27  Job:  [id number]

## 2011-01-11 NOTE — Op Note (Signed)
NAME:  Mary Acosta, Mary Acosta                ACCOUNT NO.:  [id number]   MEDICAL RECORD NO.:  [id number]          PATIENT TYPE:  OIB   LOCATION:  5733                         FACILITY:  [facility]   PHYSICIAN:  Ollen Gross. Vernell Morgans, M.D. DATE OF BIRTH:  July 15, 1956   DATE OF PROCEDURE:  11/22/2005  DATE OF DISCHARGE:  11/23/2005                                 OPERATIVE REPORT   PREOPERATIVE DIAGNOSIS:  Bilateral thyroid nodules.   POSTOPERATIVE DIAGNOSIS:  Bilateral thyroid nodules.   PROCEDURES PERFORMED:  Total thyroidectomy.   SURGEON:  Ollen Gross. Carolynne Edouard, M.D.   ASSISTANT:  Velora Heckler, M.D.   ANESTHESIA:  General endotracheal.   PROCEDURE:  After informed consent was obtained, the patient was brought to  the operating room, placed in supine position on the table.  After induction  of general anesthesia, a roll was then placed behind the patient's shoulders  to extend neck slightly, the patient's neck and chest were then prepped with  Betadine and draped in usual sterile manner.  A transverse cervical incision  was then made approximately a fingerbreadth above the clavicle from one edge  of the sternocleidomastoid to the contralateral side.  This was done with a  15 blade knife.  This incision was carried down through the skin and  subcutaneous tissue sharply with electrocautery.  The platysma was  encountered and this layer was also divided sharply with electrocautery.  The platysma layer was then grasped with Allis clamps and subplatysmal flaps  were created by a combination of sharp Bovie dissection and blunt dissection  and blunt finger dissection.  This was done superiorly and inferiorly.  Once  this was accomplished, the retractor was deployed.  The dissection was then  carried down along the midline inbetween the strap muscles, this was done  sharply with electrocautery until the trachea and the isthmus of the thyroid  were encountered.  The attention was first turned to the right side.   The  strap muscles were retracted laterally with an Army-Navy retractor.  Blunt  dissection was carried down on the anterior surface of the thyroid gland  with a Kitner in order to show the surface of thyroid gland.  Once this was  accomplished, some blunt finger dissection was carried out at the lateral  edge of the thyroid gland and then using blunt finger dissection and a Ray-  Tec sponge, the edge of the thyroid gland was retracted medially and  anteriorly. Along the lateral surface of the thyroid gland, there were  several small vessels which were controlled with small vascular clips and  divided with Metzenbaum scissors.  Once this was accomplished, it allowed  the thyroid gland to be rolled much more anteriorly and medially. The  superior pole of the thyroid gland was dissected out bluntly with the right  angle and the superior pole of the thyroid gland was controlled with 2-0  silk ties and a medium clip and then divided between the ties. As the  thyroid gland was then rolled more medially, the recurrent laryngeal nerve  was identified and care was taken to  keep dissection away from this.  Once  the thyroid gland was able to be dissected away from the area of the  recurrent laryngeal nerve, the thyroid gland was then dissected away from  the trachea by sharp Bovie dissection. The inferior pole was also mobilized  by blunt dissection with a right angle and some sharp dissection with  electrocautery and small vessels of the inferior pole the thyroid gland were  controlled with medium clips.  Once this was accomplished, the right lobe of  the thyroid gland was completely free. The dissection was then turned to the  left lobe of the thyroid gland. Again, the strap muscles were retracted  laterally with Army-Navy retractor and the anterior surface of the thyroid  gland was dissected bluntly with the Kitner.  Once the lateral edge of the  thyroid gland was visualized, several small little  vessels at the edge of  the thyroid gland were controlled with small vascular clips and divided with  Metzenbaum scissors.  This allowed the left lobe of the thyroid gland to be  rolled and retracted medially and anteriorly. The superior pole of the left  lobe of the thyroid gland was controlled with 2-0 silk ties and a medium  clip and divided between ties.  The area of the thyroid gland on the left  side was rolled more medially and anteriorly.  The area of recurrent  laryngeal nerve was identified and care was taken to keep dissection away  from this.  Once the thyroid gland had been rolled away from the area of the  recurrent laryngeal nerve, the thyroid gland was dissected free from the  trachea using sharp dissection with electrocautery. Once this was  accomplished, the thyroid gland was removed from the patient, oriented, and  sent to pathology for further evaluation. Both sides of the trachea were  examined and found to be hemostatic.  The wound was irrigated with copious  amounts of saline.  Two small postage stamps of Surgicel were placed in the  operative bed on each side and the strap muscles were then reapproximated in  the midline with interrupted 3-0 Vicryl figure-of-eight stitches.  The  platysma was reapproximated with interrupted 3-0 Vicryl simple stitches and  the skin was closed a running 4-0 Monocryl subcuticular stitch.  Benzoin,  Steri-Strips and sterile dressings were applied.  The patient tolerated the  procedure well.  All needle, sponge, and instrument counts were correct.  One parathyroid gland was identified on each side.  The other parathyroid  glands were not visualized but the dissection was carried out along the  capsule of the thyroid gland away from the from the position of the other  thyroid parathyroid glands. The patient tolerated well.  At the end of the  case, all needle, sponge, and instrument counts were correct.  The patient was awakened and taken  recovery in stable condition.      Ollen Gross. Vernell Morgans, M.D.  Electronically Signed     PST/MEDQ  D:  11/28/2005  T:  11/28/2005  Job:  [id number]

## 2011-01-11 NOTE — Op Note (Signed)
NAME:  Mary Acosta, Mary Acosta NO.:  [id number]   MEDICAL RECORD NO.:  [id number]          PATIENT TYPE:  OIB   LOCATION:  0098                         FACILITY:  [facility]   PHYSICIAN:  Thornton Park. Daphine Deutscher, MD  DATE OF BIRTH:  [birth year]   DATE OF PROCEDURE:  02/25/2006  DATE OF DISCHARGE:                                 OPERATIVE REPORT   PREOPERATIVE DIAGNOSIS:  Morbid obesity.   POSTOPERATIVE DIAGNOSIS:  Morbid obesity.   PROCEDURE:  Laparoscopic truncal vagotomy, endoscopy, laparoscopic placement  of a VG band.   SURGEON:  Thornton Park. Daphine Deutscher, M.D.   ASSISTANT:  Alfonse Ras, M.D.  Sandria Bales. Ezzard Standing, M.D.   ANESTHESIA:  General endotracheal.   DESCRIPTION OF PROCEDURE:  Dorthia Tout was taken to room 1 and given  general anesthesia.  The abdomen was prepped with chlorhexidine and draped  sterilely.  I entered through the left upper quadrant using a 0 degree scope  and the OptiVu technique without difficulty.  The other trocars were placed  as usual.  She had a tremendous amount of fat around the foregut and I had  to use a harmonic scalpel to debulk the foregut and I did.  It was difficult  to see the right crus because of the fat and I took all this down.  I then  went along the right crus and went into the retroesophageal position to find  the posterior vagus and removed a section of that and put some clips on it.  I then went anterior after taking down the phrenoesophageal ligament and  dissected out a large anterior vagus branch there, clipped it, and removed  it.   Dr. Colin Benton then went in and did an endoscopy, irrigating out the stomach, and  then flushed with Congo red showing that the vagotomy was complete. I then  went down as well as I could on the right crus and found a place and passed  the band passer across and it came out nicely up on the left crus along the  gastric margin.  Through a 15 port, which I had placed, I introduced the VG  band and  brought it around in the tract that I created.  It was snapped in  place and calibrated with the calibration tubing.  Anterior plication was  done with three interrupted 2-0 Surgideks using tie knots.  I brought the  end out through the 15 port on the right side.  She also had a little  umbilical hernia and while I had visualization, I went ahead and cut down on  that and then repaired that with a two sutures of 0 Prolene.  I then created  a place for the port where the 15 trocar was and after making that site, I  went ahead and closed the trocar hole with a single 0 Prolene.  The port was  connected after the VG band had been properly flushed and was tucked  and secured with three interrupted 2-0 Prolenes to the fascia.  All the  wounds were irrigated and then that port was closed  with interrupted 4-0  Vicryl and all wounds were closed with staples.  The patient seemed to  tolerate the procedure well and was taken to the recovery room in  satisfactory condition.      Thornton Park Daphine Deutscher, MD  Electronically Signed     MBM/MEDQ  D:  02/25/2006  T:  02/25/2006  Job:  [id number]

## 2011-01-11 NOTE — Op Note (Signed)
NAME:  Mary Acosta, Mary Acosta                          ACCOUNT NO.:  [id number]   MEDICAL RECORD NO.:  [id number]                   PATIENT TYPE:  OBV   LOCATION:  0374                                 FACILITY:  [facility]   PHYSICIAN:  Ollen Gross. Vernell Morgans, M.D.              DATE OF BIRTH:  03-24-1956   DATE OF PROCEDURE:  06/10/2002  DATE OF DISCHARGE:  06/11/2002                                 OPERATIVE REPORT   PREOPERATIVE DIAGNOSES:  Cholelithiasis.   POSTOPERATIVE DIAGNOSES:  Cholelithiasis.   PROCEDURE:  Laparoscopic cholecystectomy with intraoperative cholangiogram.   SURGEON:  Ollen Gross. Carolynne Edouard, M.D.   ASSISTANT:  Abigail Miyamoto, MD   ANESTHESIA:  General endotracheal.   DESCRIPTION OF PROCEDURE:  After informed consent was obtained, the patient  was brought to the operating room, placed in a supine position on the  operating room table. After adequate induction of general anesthesia, the  patient's abdomen was prepped with Betadine and draped in the usual sterile  manner. The area below the umbilicus was infiltrated with 0.25% Marcaine. A  small incision was made with a 15 blade knife. This incision was carried  down through the subcutaneous tissue using blunt dissection with the Kelly  clamp and Army-Navy retractors until the linea alba was identified. The  linea alba was also incised with a 15 blade knife. Each side was grasped  with Kocher clamps and elevated anteriorly. The preperitoneal space was then  probed bluntly with a hemostat until the peritoneum was opened and access  was gained to the abdominal cavity. A #0 Vicryl pursestring stitch was then  placed in the fascia surrounding this opening. A Hasson cannula was then  placed through this opening and anchored in place with the previously placed  Vicryl pursestring stitch. The abdomen was then insufflated with carbon  dioxide without difficulty. The patient was placed in a head up position and  a laparoscope was placed  through the Hasson cannula. The dome of the  gallbladder and liver edge were readily identifiable. Next after  infiltrating the area with 0.25% Marcaine, a small upper midline transverse  incision was made with a 15 blade knife. A 10 mm port was then placed  through this incision into the abdominal cavity under direct vision. Sites  were then chosen laterally on the right side of the abdomen for placement of  5 mm ports and each of these areas was infiltrated with 0.25% Marcaine.  Small stab incisions were made with a 15 blade knife and 5 mm ports were  placed bluntly through these incisions into the abdominal cavity under  direct vision. A blunt grasper was placed through the lateral most 5 mm port  and used to grasp the dome of the gallbladder and elevate it anteriorly and  superiorly. Another blunt grasper was placed through the other 5 mm port and  used to retract on the body and neck  of the gallbladder. A dissector was  placed through the upper midline port and using the electrocautery, the  peritoneal reflection at the gallbladder neck area was opened, blunt  dissection was then carried out in this area until the gallbladder neck  cystic duct junction was readily identified and a good window was created.  Care was taken to keep the common duct medial to this dissection. Once this  had been accomplished, a clip was placed on the gallbladder neck. A small  ductotomy was made just proximal to this with the laparoscopic scissors. A  12 gauge angiocath was placed percutaneously through the abdominal wall and  a Reddick cholangiogram catheter was placed through the Angiocath. The  Reddick catheter was placed within the cystic duct and anchored in place  with the clip. Cholangiogram was obtained which showed no filling defects  and good emptying into the duodenum. The clip and catheters were then  removed. Three clips were then placed proximally on the cystic duct and the  duct was divided  between the two sets of clips. Posterior to this, the  cystic artery was identified and again dissected bluntly in a  circumferential manner until a good window was created. Two clips were  placed proximally and one distally on the artery and the artery was divided  between the two. Next, a laparoscopic hook device was used to separate the  gallbladder from the liver bed. Prior to completely detaching the  gallbladder from the liver bed, the liver bed was inspected and several  small bleeding points were coagulated with the electrocautery. The  gallbladder was then detached the rest of the way from the liver bed. The  laparoscope was then moved to the upper midline port and a gallbladder  grasper was placed through the Hasson cannula and grasped the neck of the  gallbladder. The gallbladder was then removed with the Hasson cannula  through the infraumbilical port. The fascial defect was then closed with the  previously placed Vicryl pursestring stitch. The abdomen was then irrigated  with copious amounts of saline until the affluent was clear. The liver bed  was inspected again and found to be hemostatic. The ports were then all  removed under direct vision and were all hemostatic. Gas was allowed to  escape. The skin incisions were closed with interrupted 4-0 Monocryl  subcuticular stitches. Benzoin and Steri-Strips and sterile dressings were  applied. The patient tolerated the procedure well. At the end of the case,  all sponge, needle and instrument counts were correct. The patient was  awakened and taken to the recovery room in stable condition.                                               Ollen Gross. Vernell Morgans, M.D.    PST/MEDQ  D:  06/21/2002  T:  06/21/2002  Job:  [id number]

## 2011-01-11 NOTE — Procedures (Signed)
NAME:  Mary Acosta, GUBLER NO.:  [id number]   MEDICAL RECORD NO.:  [id number]          PATIENT TYPE:  OUT   LOCATION:  SLEEP CENTER                 FACILITY:  [facility]   PHYSICIAN:  Clinton D. Maple Hudson, M.D. DATE OF BIRTH:  [birth year]   DATE OF STUDY:                              NOCTURNAL POLYSOMNOGRAM   REFERRING PHYSICIAN:  Dr. Luretha Murphy.   DATE OF STUDY:  April 04, 2005.   INDICATION FOR STUDY:  Hypersomnia with sleep apnea. Epworth sleep score:  22/24, BMI 41, weight 278 pounds.   SLEEP ARCHITECTURE:  Total sleep time 313 minutes with sleep efficiency 78%.  Stage I was 4%, stage II 71%, stages III and IV absent, REM 3% of total  sleep time. Sleep latency 3.5 minutes, REM latency 327 minutes, awake after  sleep onset 39 minutes, arousal index 22. Bedtime medication was not  reported. She woke at 4:45 a.m. stating that she could not lie down any more  because of back pain and study was ended then.   RESPIRATORY DATA:  Split study protocol. Respiratory disturbance index (RDI,  AHI) 40.6 obstructive events per hour indicating moderately severe  obstructive sleep apnea/hypopnea syndrome before C-PAP. This included two  obstructive apneas and 95 hypopneas before C-PAP. Most sleep, therefore,  most events were while on the left side or back. REM RDI 19.2. C-PAP was  titrated to 16 CWP, and AHI 0 per hour, using a medium comfort full to mask  with heated humidifier.   OXYGEN DATA:  Loud snoring with oxygen desaturation to a nadir of 71% before  C-PAP. After C-PAP control saturation held 90-95% on room air.   CARDIAC DATA:  Normal sinus rhythm.   MOVEMENT/PARASOMNIA:  Occasional leg jerk with little effect on sleep.   IMPRESSION/RECOMMENDATIONS:  1.  Moderately severe obstructive sleep apnea/hypopnea syndrome, respiratory      disturbance index 40.6 per hour with loud snoring and oxygen      desaturation to 71%.  2.  Successful C-PAP titration to 16 centimeter of  water pressure,      respiratory disturbance index 0 per hour, using a medium comfort full to      mask with heated humidifier.      Clinton D. Maple Hudson, M.D.  Diplomate, Biomedical engineer of Sleep Medicine  Electronically Signed     CDY/MEDQ  D:  04/07/2005 11:37:54  T:  04/07/2005 22:09:41  Job:  [id number]

## 2011-01-11 NOTE — Op Note (Signed)
NAME:  Mary Acosta, Mary Acosta NO.:  [id number]   MEDICAL RECORD NO.:  [id number]            PATIENT TYPE:   LOCATION:                                 FACILITY:   PHYSICIAN:  Vanita Panda. Magnus Ivan, M.D.DATE OF BIRTH:   DATE OF PROCEDURE:  04/08/2006  DATE OF DISCHARGE:                                 OPERATIVE REPORT   PREOPERATIVE DIAGNOSIS:  Left knee internal derangement with severe  degenerative joint disease and questionable medial meniscal tear.   POSTOPERATIVE DIAGNOSIS:  Left knee internal derangement with severe  degenerative joint disease and questionable medial meniscal tear with  degenerative tearing of medial and lateral meniscus.   PROCEDURE:  Left knee arthroscopy with debridement of significant  chondromalacia of the medial compartment and the patellofemoral joint  including the trochlea.   SURGEON:  Vanita Panda. Magnus Ivan, M.D.   ANESTHESIA:  General.   ANTIBIOTICS:  1 gram IV Ancef.   COMPLICATIONS:  None.   BLOOD LOSS:  Minimal.   INDICATIONS:  Briefly [name] is a 55 year old morbidly obese individual  who 2 months ago underwent a lap-band/stomach stapling procedure for weight  reduction.  She continued to be plagued by severe pain involving her left  knee; and her primary care physician did obtain an MRI that showed  significant degenerative changes in the knee.  She wanted to undergo a total  knee replacement surgery, but I tried to dissuade her from this, given the  fact that she has polymyalgia rheumatica and diabetes; and is chronic  steroids, as well as, the fact that I felt that she needed to give her  weight reduction a chance to help her knees feel better.  She has had  injections in her knees before; and wanted to proceed with at least some  type of surgical intervention to help temporize her knee.  I had talked to  her about the possibility of knee arthroscopy for lavaging and the knee; as  well as removing of any type of  loose bodies, and shaving off areas of  cartilage and synovitis.  The risks and benefits of this were explained to  her, including the risk of infection and DVT; and she agreed to proceed with  surgery.   PROCEDURE DESCRIPTION:  After informed consent was obtained the appropriate  left leg was marked and [name] was brought to the operating room; and  placed supine on the operating table and general anesthesia was then  obtained.  No tourniquet was utilized during the case.  The leg was prepped  and draped with DuraPrep and sterile drapes including sterile stockinette;  and sterile arthroscopy drapes with the knee flexed off the side table  against a post.   An anterolateral incision was made just lateral to the patellar tendon at  the level of the inferior pole of the patella.  The  arthroscope was  inserted and immediately I went to the medial compartment, and made a medial  portal at the same level.  There was significant degenerative changes noted  on the medial femoral condyle with a grade  4 chondromalacia as well  degenerative fraying of the meniscus, but I did probe the meniscus; and it  was intact.  The ACL was intact as well as the lateral compartment was  completely intact with minimal arthritic changes.  She did have significant  chondromalacia on the undersurface of the patella which was grade 4 as well  as the trochlear groove; and I used the arthroscopic shaver to do cartilage  shaving along the patella and medial femoral condyle, and the trochlear  groove removing free cartilage edges so these were stable.  I then allowed,  the fluid to lavage through the knee; removed all instrumentation, drained  the fluid from her knee.  I inserted a mixture of 4 mg of morphine with 1/2%  plain Sensorcaine.  I then closed all arthroscopy portal sites with  interrupted 4-0 nylon suture.  Xeroform was placed followed by a well-padded  sterile dressing and Ace wrap.  She was awakened,  extubated, and taken to  the recovery room in stable condition.  All final counts were correct; and  there no complications noted.           ______________________________  Vanita Panda. Magnus Ivan, M.D.     CYB/MEDQ  D:  04/08/2006  T:  04/08/2006  Job:  [id number]

## 2011-01-11 NOTE — Op Note (Signed)
NAME:  Mary Acosta, Mary Acosta                          ACCOUNT NO.:  [id number]   MEDICAL RECORD NO.:  [id number]                   PATIENT TYPE:  AMB   LOCATION:  SDC                                  FACILITY:  [facility]   PHYSICIAN:  Naima A. Dillard, M.D.              DATE OF BIRTH:  1956-03-25   DATE OF PROCEDURE:  05/16/2003  DATE OF DISCHARGE:                                 OPERATIVE REPORT   PREOPERATIVE DIAGNOSES:  1. Menorrhagia.  2. Abnormal Papanicolaou smear of two-grade discrepancy from colposcopy.   POSTOPERATIVE DIAGNOSES:  1. Menorrhagia.  2. Abnormal Papanicolaou smear of two-grade discrepancy from colposcopy.   PROCEDURES:  1. Fractional dilatation and curettage.  2. Hysteroscopy.  3. Cryoablation.  4. Loop electrosurgical excision procedure.   SURGEON:  Naima A. Dillard, M.D.   ESTIMATED BLOOD LOSS:  Minimal.   ANESTHESIA:  Spinal.   IV fluids were 1200 mL crystalloid.  There was a 30 mL deficit of 3%  sorbitol.   COMPLICATIONS:  None.   FINDINGS:  A uterus that sounded to 8 cm.  There was abundant fluffy  endometrium noted in the endometrial cavity, no fibroid or masses noted.  Both ostia were visualized.  There were mild areas that did not stain with  Lugol's at 12 and 2 o'clock.  The LEEP was done without difficulty.   PROCEDURE IN DETAIL:  The patient was taken to the operating room, where she  was given spinal anesthesia, placed in the dorsal lithotomy position,  prepped and draped in a normal sterile fashion.  The bladder was drained of  about 50 mL.  The bivalve speculum was then placed into the vagina.  The  anterior lip of the cervix was grasped with a single-tooth tenaculum.  The  cervix was then dilated.  Before dilation an endocervical curettage was  placed into the endocervical canal and endocervical curettings were done.  The cervix was then dilated up to 21.  The hysteroscope was placed into the  uterine cavity.  The findings noted above were  seen.  There was a 30 mL  deficit.  There were no fibroids, no polyps noted, just abundant  endometrium.  The hysteroscope was removed.  A curettage was done.  Abundant  endometrium was collected and sent to pathology.  The cryoprobe for  cryoablation was then placed up to 8 cm and just moved back to 7, placed in  the patient's left cornu, and cryoablation was done for six minutes.  The  probe was then warmed and moved to the patient, the left ostium was done  first and then it was moved to the patient's right ostium and removed back  and cryoablated for six minutes.  The probe was removed without difficulty.  Lugol's was then applied to the cervix and the LEEP was placed on, a size 12  x 20 loop was used.  Cut was placed on  60, coagulation was placed on 40, and  cut was then done without difficulty and the cervix was removed.  The  cervical bed was made hemostatic with both cautery and Lugol's solution.  All instruments were removed from the vagina, cervix, tenaculum had shown  hemostasis.  Sponge, lap, and needle counts were correct x2.  Specimen was  pinned to a cork board.  Endocervical curettings and endometrial curettings  were sent to pathology.  Sponge, lap, and needle counts were correct x2.  The patient went to the recovery room in stable condition.                                               Naima A. Normand Sloop, M.D.    NAD/MEDQ  D:  05/16/2003  T:  05/17/2003  Job:  [id number]

## 2011-01-11 NOTE — H&P (Signed)
NAME:  Mary Acosta, Mary Acosta                          ACCOUNT NO.:  [id number]   MEDICAL RECORD NO.:  [id number]                   PATIENT TYPE:  AMB   LOCATION:  SDC                                  FACILITY:  [facility]   PHYSICIAN:  Naima A. Dillard, M.D.              DATE OF BIRTH:  12-12-1955   DATE OF ADMISSION:  DATE OF DISCHARGE:                                HISTORY & PHYSICAL   CHIEF COMPLAINT:  1. Abnormal Pap smear with greater than two-grade discrepancy.  2. Menorrhagia.   HISTORY OF PRESENT ILLNESS:  The patient is a 55 year old white female who  presents on April 01, 2003, with complaint of heavy vaginal bleeding for  four months.  The patient has menses every month that last about eight to 10  days.  She changes the pad about six times a day.  Denies using any  contraception or hormone replacement therapy.  Denies a history of fibroids.  Denies any abdominal pain or menopausal symptoms.  She had an endometrial  biopsy on April 01, 2003, which was significant for proliferative  endometrium.  The patient had a Pap smear April 01, 2003, significant for  high-grade squamous intraepithelial lesion.  She has had abnormal Paps in  September 2000 and October 2000.  In September 2000 she had low-grade  squamous intraepithelial lesion.  In October 2000 she had a high-grade  squamous intraepithelial lesion.  Colposcopic biopsies were negative.  The  colposcopy was adequate; however, the biopsies were negative along with the  ECC.   PAST MEDICAL HISTORY:  1. Morbid obesity.  2. Diabetes mellitus.  3. GERD.  4. Hypertension.   PAST SURGICAL HISTORY:  Significant for a tonsillectomy, cholecystectomy,  and bilateral tubal ligation.   ALLERGIES:  SULFA.  She says this gives her nausea, vomiting, and a rash.   MEDICATIONS:  1. Altace.  2. Nexium.  3. Lasix.  4. Glucophage.   PAST OBSTETRICAL HISTORY:  Significant for a vaginal delivery x2.   PAST GYNECOLOGIC HISTORY:  She has no  history of sexually transmitted  disease.  Pap smear history is as above.  The patient started menarche at  age 33-13.  It comes every month and lasts for eight to 10 days.   FAMILY HISTORY:  Significant for diabetes in grandparents, hypertension in  her father, and maternal grandmother with ovarian CA.   REVIEW OF SYSTEMS:  The patient wears corrective lenses.  CARDIOVASCULAR:  Unremarkable.  GASTROINTESTINAL:  Significant for obese and GERD.  ENDOCRINE:  Significant for diabetes mellitus type 2.  MUSCULOSKELETAL:  Significant for a herniated disk.  PSYCHIATRIC:  Significant for a history  of depression.   PHYSICAL EXAMINATION:  VITAL SIGNS:  Her weight is greater than 350 pounds.  Her blood pressure is 120/80.  HEENT:  Her head is normocephalic, atraumatic.  Pupils are equal.  Hearing  is normal.  Throat is clear.  NECK:  Thyroid is not enlarged.  CARDIAC:  Regular rate and rhythm.  CHEST:  Clear to auscultation bilaterally.  BREASTS:  No masses, discharge, skin changes, or nipple retraction.  BACK:  No CVA tenderness.  ABDOMEN:  Nontender without any masses or organomegaly; however, it is  obese.  EXTREMITIES:  No cyanosis, clubbing, or edema.  NEUROLOGIC:  Within normal limits.  PELVIC:  Her vulvovaginal exam is within normal limits.  Cervix is  nontender, without any lesions.  The uterus appears to be normal size but it  is difficult to tell secondary to obesity.  No adnexal masses were palpated.  No rectovaginal masses palpated.  The patient does have a first degree  cystocele.   ASSESSMENT:  1. Menorrhagia.  2. Morbid obesity.  3. Abnormal Pap smear.   PLAN:  D&C hysteroscopy, cryoablation, and LEEP procedure.  The patient had  an ultrasound at the office, which demonstrated her uterus to be 8.4 x 3.19  x 5.48.  No fibroids or polyps were seen.  The patient had normal ovaries  bilaterally.  Lab:  Her hemoglobin is 13.  The patient was told the options  for menorrhagia  are but not limited to a D&C, medical treatment with  Provera, D&C hysteroscopy, cryoablation, observation, hysterectomy.  She was  told the risks and benefits of them all, and she has chosen to proceed with  D&C hysteroscopy, cryoablation.  She understands the risks are but not  limited to bleeding, infection, damage to internal organs such as bowel and  bladder through perforation of the uterus.  The patient also consented to a  LEEP procedure secondary to having a history of abnormal Pap smear and  normal colposcopy with a two-degree discrepancy.  The patient was also given  the option of observation versus a LEEP cone biopsy.  The patient  understands that with the LEEP she can have bleeding, infection, and damage  to internal organs through perforation of the uterus.  The patient  consented.  She understands the risks.  Medical clearance was obtained.  As  far as her cystocele is concerned, currently asymptomatic; however, if she  has any difficulties we will refer her for possible anterior repair and  vaginal sling.  The patient desires to have a surgery similar to the gastric  bypass first before proceeding with any other surgeries after the one today.                                                 Naima A. Normand Sloop, M.D.    NAD/MEDQ  D:  05/16/2003  T:  05/16/2003  Job:  [id number]

## 2011-01-11 NOTE — Op Note (Signed)
NAME:  Mary Acosta, FRAPPIER NO.:  [id number]   MEDICAL RECORD NO.:  [id number]          PATIENT TYPE:  OIB   LOCATION:  2899                         FACILITY:  [facility]   PHYSICIAN:  Ollen Gross. Vernell Morgans, M.D. DATE OF BIRTH:  August 15, 1956   DATE OF PROCEDURE:  02/28/2005  DATE OF DISCHARGE:                                 OPERATIVE REPORT   PREOPERATIVE DIAGNOSIS:  Left breast atypical ductal hyperplasia.   POSTOPERATIVE DIAGNOSIS:  Left breast atypical ductal hyperplasia.   PROCEDURE:  Left breast needle localized lumpectomy.   SURGEON:  Ollen Gross. Carolynne Edouard, M.D.   ANESTHESIA:  General endotracheal.   PROCEDURE:  After informed consent obtained, the patient was brought to the  operating room and placed in supine position on the operating room table.  After adequate induction of general anesthesia, the patient's left breast  was prepped with Betadine and draped in usual sterile manner. The patient  had previously undergone a needle localization procedure earlier in the day  and the wire was in place, which was prepped into the field. Next, an  elliptical incision was made overlying the area in question on the breast  including the entry site of the wire. This incision was opened further with  electrocautery and the dissection was carried out in a circumferential  manner sharply with the electrocautery to include all the tissue around the  path and tip of the wire. This was done without difficulty and once the  specimen was removed, and it was oriented with a margin map as well as a  stitch that was indicated to be anterior. Specimen was sent to radiology  where specimen radiograph saw the lesions in question, included in the  specimen.  At this point, the wound was irrigated, copious amounts of  saline. Hemostasis was achieved using Bovie electrocautery. The incision was  then closed with a deep layer of interrupted 3-0 Vicryl stitches and the  skin was closed with a running 4-0  Monocryl subcuticular stitch. Benzoin,  Steri-Strips and sterile dressings were applied. The patient tolerated the  procedure well.  At the end of the case, all needle, sponge and instrument  counts correct. The patient was then awakened and taken recovery in stable  condition.       PST/MEDQ  D:  02/28/2005  T:  02/28/2005  Job:  [id number]

## 2011-01-11 NOTE — H&P (Signed)
NAME:  CRISTEL, RAIL NO.:  [id number]   MEDICAL RECORD NO.:  [id number]          PATIENT TYPE:  EMS   LOCATION:  MAJO                         FACILITY:  [facility]   PHYSICIAN:  Peter M. [place], M.D.  DATE OF BIRTH:  [birth year]   DATE OF ADMISSION:  08/25/2004  DATE OF DISCHARGE:  08/25/2004                                HISTORY & PHYSICAL   HISTORY OF PRESENT ILLNESS:  Ms. Maisie Fus is a 55 year old white female who  presents for evaluation of chest pain.  On August 25, 2004, she developed  substernal chest pain after eating her evening meal.  The pain radiated to  the left shoulder and arm.  It was associated with dyspnea, nausea, and a  cold sweat.  She went to the emergency room where an evaluation was  unremarkable including cardiac enzymes, chest x-ray, and ECG.  Following  this, she did develop chest pain while vacuuming.  It was relieved with  rest.  She also reports that her pain on New Year's Eve was relieved with  nitroglycerin.  The patient had prior cardiac workup, in March 2004, with an  adenosine Cardiolite study which showed probable breast attenuation  artifact.  Ejection fraction was 51%.  She had an echocardiogram, in July  2004, which was normal.   PAST MEDICAL HISTORY:  1.  Insulin-dependent-diabetes mellitus.  2.  Hypertension.  3.  Hypercholesterolemia.  4.  Osteoarthritis with chronic back pain.  5.  COPD.  6.  Status post cholecystectomy.  7.  Status post appendectomy.  8.  Status post tubal ligation.  9.  History of pneumonia.   ALLERGIES:  SULFA.   CURRENT MEDICATIONS:  1.  Celebrex 100 mg b.i.d.  2.  Neurontin t.i.d.  3.  Morphine 30 mg b.i.d.  4.  Accupril, unknown dose, daily.  5.  Byetta 10 mcg subcu b.i.d.  6.  Lantus insulin 35 units in the morning and 50 units q.h.s.  7.  Lipitor daily.  8.  Quinine p.r.n.  9.  Lasix 20 mg p.r.n.  10. Potassium 20 mEq p.r.n.  11. Phenergan 25 mg b.i.d.  12. Albuterol inhaler  p.r.n.   SOCIAL HISTORY:  The patient works as a Advertising copywriter.  She is married, has  two children.  She smokes one and a half packs per day, has been a smoker  for 32 years.  She denies alcohol use.   FAMILY HISTORY:  Father died at age 73 with myocardial infarction and  hypertension.  Mother is age 34 and has a history of coronary disease,  hypertension, and diabetes.  One brother died with Down Syndrome at age 51.  A sister died with Down syndrome at age 69.  She has two sisters who are  alive and well.   REVIEW OF SYSTEMS:  As noted in HPI.  She has chronic back pain.  She  complains of chronic lower extremity edema for which she uses p.r.n.  diuretics.  Other review of systems are negative.   PHYSICAL EXAMINATION:  GENERAL:  The patient is an obese white female in no  apparent distress.  VITAL SIGNS:  Blood pressure is 150/90, pulse 88 and regular, respirations  20, weight is 316 pounds.  HEENT:  Normocephalic, atraumatic.  Pupils equal, round, reactive.  Conjunctivae are clear.  Oropharynx is clear.  NECK:  Without JVD, adenopathy, thyromegaly, or bruits.  LUNGS:  Clear to auscultation and percussion.  CARDIAC:  Reveals a regular rate and rhythm without murmurs, rubs, or  gallops.  ABDOMEN:  Obese, soft, and nontender without masses or bruits.  EXTREMITIES:  She does have trace to 1+ lower extremity edema.  Pulses are  2+ and symmetric.  NEUROLOGIC:  Nonfocal.   LABORATORY DATA:  A prior ECG shows a normal sinus rhythm with low voltage,  otherwise no acute change.   Chest x-ray shows no active disease.   IMPRESSION:  1.  Chest pain consistent with angina pectoris.  The patient has multiple      cardiac risk factors.  Prior adenosine Cardiolite study, two years ago,      was not definitive.  2.  Insulin-dependent-diabetes mellitus.  3.  Hypertension.  4.  Hyperlipidemia.  5.  Tobacco abuse.  6.  Chronic obstructive pulmonary disease.  7.  Family history of coronary  disease.  8.  Status post cholecystectomy.  9.  Osteoarthritis.   PLAN:  We will proceed with diagnostic cardiac catheterization for a more  definitive evaluation of potential ischemia.       PMJ/MEDQ  D:  08/31/2004  T:  08/31/2004  Job:  [id number]   cc:   Gwen Pounds, MD  Fax: [phone]

## 2011-01-11 NOTE — Cardiovascular Report (Signed)
NAME:  Mary, Acosta NO.:  [id number]   MEDICAL RECORD NO.:  [id number]          PATIENT TYPE:  OIB   LOCATION:  6501                         FACILITY:  [facility]   PHYSICIAN:  Peter M. [place], M.D.  DATE OF BIRTH:  1955/09/04   DATE OF PROCEDURE:  09/04/2004  DATE OF DISCHARGE:                              CARDIAC CATHETERIZATION   INDICATIONS FOR PROCEDURE:  A 55 year old white female with history of  morbid obesity, insulin-dependent diabetes mellitus, hypertension,  hyperlipidemia, tobacco abuse and family history of coronary disease who  presents with symptoms of chest pain consistent with angina. Prior  noninvasive cardiac evaluation was equivocal.   PROCEDURES:  Left heart catheterization, coronary and left ventricular  angiography.   Access was via the right femoral artery using standard Seldinger technique.  Equipment used 4-French 4 cm right and left Judkins catheter 4-French  pigtail catheter, 4-French arterial sheath.   MEDICATIONS:  Local anesthesia1% Xylocaine.   CONTRAST:  90 cc of Omnipaque.   COMPLICATIONS:  None.   HEMODYNAMIC DATA:  Aortic pressure was 100/62 with a mean of 79 mmHg. Left  ventricle pressure was 97 with EDP of 16 mmHg.   ANGIOGRAPHIC DATA:  The left coronary arises and distributes normally. The  left main coronary is normal.   The left anterior descending artery is normal.   There is a very large intermediate vessel which is normal.   The left circumflex coronary is normal.   The right coronary artery is a large dominant vessel and is normal.   Left ventricular angiography performed in RAO view demonstrates normal left  ventricular size and contractility with normal systolic function. Ejection  fraction is estimated at 60%. There is no mitral vegetation or prolapse.   FINAL INTERPRETATION:  1.  Normal coronary anatomy.  2.  Normal left ventricular function.       PMJ/MEDQ  D:  09/04/2004  T:  09/04/2004  Job:   [id number]   cc:   Gwen Pounds, MD  Fax: [phone]

## 2011-01-16 ENCOUNTER — Inpatient Hospital Stay (HOSPITAL_COMMUNITY): Admission: RE | Admit: 2011-01-16 | Payer: Medicare Other | Source: Ambulatory Visit

## 2011-01-16 ENCOUNTER — Ambulatory Visit (HOSPITAL_COMMUNITY)
Admission: RE | Admit: 2011-01-16 | Discharge: 2011-01-16 | Disposition: A | Discharge status: Home / Self Care | Payer: Medicare Other | Source: Ambulatory Visit | Attending: Cardiology | Admitting: Cardiology

## 2011-01-16 DIAGNOSIS — R079 Chest pain, unspecified: Secondary | ICD-10-CM

## 2011-01-16 MED ORDER — IOHEXOL 350 MG/ML SOLN
80.0000 mL | Freq: Once | INTRAVENOUS | Status: AC | PRN
  Administered 2011-01-16: 80 mL via INTRAVENOUS

## 2011-01-18 ENCOUNTER — Telehealth: Payer: Self-pay | Admitting: *Deleted

## 2011-01-18 NOTE — Telephone Encounter (Signed)
Notified of CTA results. Will send Dr. Cyndia Bent copy

## 2011-01-18 NOTE — Telephone Encounter (Signed)
Message copied by Lorayne Bender on Fri Jan 18, 2011  8:51 AM ------      Message from: [place], PETER M      Created: Wed Jan 16, 2011  3:42 PM       Excellent CTA results, No coronary disease. Continue risk factor modification and healthy lifestyle.

## 2011-02-21 ENCOUNTER — Telehealth: Payer: Self-pay | Admitting: Gastroenterology

## 2011-02-21 NOTE — Telephone Encounter (Signed)
Left message for pt to call back  °

## 2011-02-22 ENCOUNTER — Encounter: Payer: Self-pay | Admitting: *Deleted

## 2011-02-22 ENCOUNTER — Other Ambulatory Visit (INDEPENDENT_AMBULATORY_CARE_PROVIDER_SITE_OTHER): Payer: Medicare Other | Admitting: Gastroenterology

## 2011-02-22 DIAGNOSIS — R197 Diarrhea, unspecified: Secondary | ICD-10-CM

## 2011-02-22 MED ORDER — CHOLESTYRAMINE 4 G PO PACK: PACK | ORAL | Status: DC

## 2011-02-22 NOTE — Telephone Encounter (Addendum)
Pt 's last OV 08/18/2007, and Dr Christella Hartigan did ECL at [facility] on 11/26/2007 showing retained food- perhaps gastroparesis from narcotic use; colon showed hemorrhoids. She has a GI hx of Gastric banding, truncal vagotomy, cholecystectomy and diverticula per CT scan 07/24/07. Today, pt reports diarrhea x 2 months and she is on her 3rd bottle of Lomotil. She reports if she doesn't take the Lomotil q4hr, the diarrhea starts up again. About 4 years ago the same thing happened when she drank unpasturized milk. She reports she took a stool cx to her PCP, but they stated she took a formed stool and they need a watery one. She reports her "colon is sore", and when her PCP pressed on her belly, he said the problem was her colon. Pt denies a fever or cramping.   Per Dr Jarold Motto, order Cholestryamine 4gram in juice qam 2 hours apart from other meds; f/u appt with him on 04/02/11 @ 1:30pm

## 2011-02-22 NOTE — Telephone Encounter (Signed)
Notified pt of new med with instructions and her f/u appt on 04/02/11; she will call for further problems.

## 2011-02-26 ENCOUNTER — Telehealth: Payer: Self-pay | Admitting: *Deleted

## 2011-02-26 NOTE — Telephone Encounter (Signed)
Pt was placed on Cholestyramine on 02/21/11 by Dr Jarold Motto for diarrhea x 2 months. Pt called today and stated the drug hasn't helped any. She reports she is still taking Lomotil 2 q4 hours or she has stools up to 15 a day.I asked if she is taking all other drugs 2 hours apart from the cholestyramine and she replied yes. Pt stated she has gas more frequently when she lies on her r side. Explained to pt she called at 4:11pm and picked up the call around 4:40pm and there's nothing I can do this late. Instructed the pt to go to the ER tonight or tomorrow id needed d/t the 4th. Pt stated understanding. Pt stated her stool studies were normal at Dr Fortunato Curling ofc.

## 2011-03-01 ENCOUNTER — Telehealth: Payer: Self-pay | Admitting: Gastroenterology

## 2011-03-01 NOTE — Telephone Encounter (Signed)
Pt scheduled to Willette Cluster, NP on Monday, 03/04/11 at 0830am.

## 2011-03-04 ENCOUNTER — Encounter: Payer: Self-pay | Admitting: Nurse Practitioner

## 2011-03-04 ENCOUNTER — Ambulatory Visit (INDEPENDENT_AMBULATORY_CARE_PROVIDER_SITE_OTHER): Payer: Medicare Other | Admitting: Nurse Practitioner

## 2011-03-04 VITALS — BP 126/80 | HR 68 | Ht 69.0 in | Wt 229.0 lb

## 2011-03-04 DIAGNOSIS — R197 Diarrhea, unspecified: Secondary | ICD-10-CM

## 2011-03-04 MED ORDER — ALIGN 4 MG PO CAPS: 1.0000 | ORAL_CAPSULE | Freq: Every day | ORAL | Status: DC

## 2011-03-04 NOTE — Patient Instructions (Addendum)
Finish the Cipro and the metronidazole. Take the Align probiotic samples, take 1 capsule daily. When you finish the  Antibiotics stop the cholestyramine.  If the diarrhea has not returned, if it does return resume the cholestyramine. Make an appointment to follow up with Dr.David Jarold Motto for 3-4 weeks out.

## 2011-03-04 NOTE — Progress Notes (Signed)
03/04/2011 Mary Acosta [id number] Dec 21, 1955   HISTORY OF PRESENT ILLNESS: Mary Acosta is a 55 year old female who has not been seen here in at least 3 years but has been followed by Dr. Jarold Motto for a history of GERD and chronic irritable bowel syndrome. She is s/p laparoscopic truncal vagotomy and LAP BAND 2007. Mary Acosta has multiple medical problems including but not limited to COPD, polymyalgia rheumatica, thyroid cancer, history of TIA, significant insomnia. She is on chronic pain medication in the form of a fentanyl patch.  She had a colonoscopy for evaluation of constipation in April of 2009. She required a significant amount of propofol for sedation.  Mary Acosta is here today for evaluation of diarrhea which she states began in February but PCP notes suggest more recent onset.  Stool studies in June included negative C. difficile toxin x1. Ova and parasite negative, stool culture negative. Patient was prescribed Lomotil which has not really helped. Her diarrhea has mainly postprandial but she has some nocturnal diarrhea as well. The patient started a probiotic last week. She had called our office late last month and was started on cholestyramine. Patient saw her primary care physician in followup on Friday, was given Cipro and Flagyl and schedule for CT scan of the abdomen and pelvis (I do not have that office note). Labs from that visit included a CBC which was unremarkable except for an MCV of 79. Note her hemoglobin was 13.0. Comprehensive metabolic profile was unremarkable. Between the cholestyramine and antibiotics, the patient's diarrhea has significantly improved. She has gone from having 15 or so loose bowel movements a day to no more than 3 or 4.   Past Medical History  Diagnosis Date  . TIA (transient ischemic attack) 11/13/10    carotid doppler-mild (B) plaque formation  . Anxiety     with panic attacks  . Insomnia   . Chronic pain     followed by pain clinic @ [facility]    . Diabetes mellitus     insulin-dependent  . HTN (hypertension)   . Left arm pain   . COPD (chronic obstructive pulmonary disease)   . Osteoarthritis     with severe disease in knee  . Hypercholesterolemia   . Chronic back pain     Osteoarthritis  . History of pneumonia   . History of chest pain   . Tobacco abuse   . History of echocardiogram 12/24/2006    EF was 55-60%  / it did show mild LVH, otherwise was normal     . Mixed stress and urge urinary incontinence 09/29/2010    she had tension-free vaginal tape followed by cystoscopy  . Polymyalgia rheumatica   . Thyroid cancer   . Fatty liver disease, nonalcoholic   . Scoliosis   . Postmenopausal bleeding   . GERD (gastroesophageal reflux disease)   . Morbid obesity 07/70/07    underwent -- LAPAROSCOPIC GASTRIC BANDING  . Cholelithiasis 06/10/2002    Laparoscopic cholecystectomy with intraoperative cholangiogram.  . Obstructive sleep apnea 04/04/2005    underwent -- NOCTURNAL POLYSOMNOGRAM: 1. Moderately severe obstructive sleep apnea/hypopnea syndrome, respiratory disturbance index 40.6 per hour with loud snoring & oxygen desaturation to 71%./ 2.Successful C-PAP titration to 16 centimeter of water pressure, respiratory disturbance index 0 per hour, using a medium comfort full to mask with heated humidifier.   . Asthma   . Depression    Past Surgical History  Procedure Date  . Thyroidectomy 2005    for thyroid cancer  .  Breast lumpectomy 2007    (benign)  . Appendectomy 1982  . Knee surgery 2007    Revision left total knee arthroplasty, poly revision / Left total knee arthroplasty with progressive  painful ligamentous laxity   . Cardiac catheterization 02/02/2005    EF is estimated at 60% /  Normal coronary anatomy / Normal left ventricular function   . Cystoscopy 09/29/2010    placement of  tension-free vaginal tape followed by cystoscopy, tolerating procedure well  . Tonsillectomy 1969  . Tubal ligation 1983  .  Hysteroscopy w/d&c 05/16/2003  . Leep 05/16/2003    Loop electrosurgical excision procedure  . Cryoablation 05/16/2003    cryoablation of her cervix  . Laparoscopic gastric banding 03/01/2006    Laparoscopic truncal vagotomy, endoscopy, laparoscopic placement of a VG band  . Cholecystectomy, laparoscopic 06/10/2002     with intraoperative cholangiogram / due to Cholelithiasis  . Knee arthroscopy w/ debridement 03/29/2006    Left knee arthroscopy with debridement of significant  chondromalacia of the medial compartment and the patellofemoral joint including the trochlea.  . Bladder suspension     reports that she quit smoking about 4 months ago. She does not have any smokeless tobacco history on file. She reports that she does not drink alcohol or use illicit drugs. family history includes Breast cancer in her other; Colon cancer in her maternal uncle; Diabetes in her mother; and Heart disease in her father and mother. Allergies  Allergen Reactions  . Sulfonamide Derivatives Nausea Only      Outpatient Encounter Prescriptions as of 03/04/2011  Medication Sig Dispense Refill  . aspirin 81 MG tablet Take 81 mg by mouth daily as needed.       . ciprofloxacin (CIPRO) 500 MG tablet Take 500 mg by mouth 2 (two) times daily. X 10 days       . diphenoxylate-atropine (LOMOTIL) 2.5-0.025 MG per tablet Take 1-2 tablets by mouth four times daily as needed       . DULoxetine (CYMBALTA) 60 MG capsule Take 60 mg by mouth 2 (two) times daily.       . fentaNYL (DURAGESIC - DOSED MCG/HR) 75 MCG/HR Place 1 patch onto the skin every 3 (three) days.        Marland Kitchen levothyroxine (SYNTHROID, LEVOTHROID) 112 MCG tablet Take 112 mcg by mouth daily.        . metroNIDAZOLE (FLAGYL) 500 MG tablet Take 500 mg by mouth 2 (two) times daily. X 10 days       . solifenacin (VESICARE) 5 MG tablet Take 10 mg by mouth daily.        . Tapentadol HCl (NUCYNTA) 75 MG TABS Take by mouth 3 (three) times daily as needed.       . Probiotic  Product (ALIGN) 4 MG CAPS Take 1 capsule by mouth daily.  28 capsule  0  . DISCONTD: cholestyramine (QUESTRAN) 4 G packet Mix 1 packet in juice and take by mouth daily. Take this med 2 hours apart from other meds!!!  60 each  1  . DISCONTD: levothyroxine (SYNTHROID, LEVOTHROID) 125 MCG tablet Take 125 mcg by mouth daily.        Marland Kitchen DISCONTD: temazepam (RESTORIL) 22.5 MG capsule Take 22.5 mg by mouth at bedtime as needed.           REVIEW OF SYSTEMS  : Positive for anxiety, arthritis, back pain, allergies, muscle cramps, sleeping problems. All other systems reviewed and negative except where noted in the History of  Present Illness.   PHYSICAL EXAM: General: Obese white female in no acute distress Head: Normocephalic and atraumatic Eyes:  sclerae anicteric,conjunctive pink. Ears: Normal auditory acuity Mouth: No deformity or lesions Neck: Supple, no masses.  Lungs: Clear throughout to auscultation Heart: Regular rate and rhythm; no murmurs heard Abdomen: Soft, non distended, nontender. No masses or hepatomegaly noted. Normal Bowel sounds Rectal: Not done Musculoskeletal: Symmetrical with no gross deformities  Skin: No lesions on visible extremities Extremities: No edema or deformities noted Neurological: Alert oriented x 4, grossly nonfocal Cervical Nodes:  No significant cervical adenopathy Psychological:  Alert and cooperative. Normal mood and affect  ASSESSMENT AND PLAN;

## 2011-03-05 ENCOUNTER — Telehealth: Payer: Self-pay | Admitting: *Deleted

## 2011-03-05 NOTE — Progress Notes (Signed)
I AGREE WITH EVALUATION AND PLANS

## 2011-03-05 NOTE — Telephone Encounter (Signed)
Called and spoke to pt to advise when she was here to see Willette Cluster ACNP, we made her an appt to see Dr Christella Hartigan on 04-05-11 and it should have been Dr. Jarold Motto.  I cancelled the appt with Dr. Christella Hartigan and made her one with Dr. Jarold Motto also on 04-05-11 oat 3:00 PM .

## 2011-03-05 NOTE — Assessment & Plan Note (Addendum)
Four month history of diarrhea with negative stool studies negative including C. difficile toxin x1 (PCR not done). Diarrhea has significantly improved since initiation of a probiotic, Cholestyramine, Cipro and Metronidazole. Conntinue probiotic for a total of 30 days. To prevent exacerbation of chronic constipation patient will try discontinuing cholestyramine upon completion of antibiotics. She will followup with [place] in 3-4 weeks. I do not do not have the most recent office note from PCP but patient is apparently scheduled for a CT scan of the abdomen. If the scan is being done for evaluation of diarrhea, I think at this point it be postponed.

## 2011-03-28 ENCOUNTER — Telehealth: Payer: Self-pay | Admitting: Gastroenterology

## 2011-03-29 NOTE — Telephone Encounter (Signed)
Pt reports she had diarrhea for months and after her OV with Willette Cluster, NP on 03/04/11 it stopped. She was already on Cipro and Flagyl and Paula placed her on Questran and her Diarrhea was under control Yesterday, pt reports the diarrhea started again- all liquid. She called [place] after 4:30pm and then called her PCP who placed her on Flagyl x 10 days and she took Lomotil. Today, no diarrhea. Pt instructed to take her Flagyl as ordered and stop the Lomotil. If the diarrhea returns, restart the Questran. She should be ok until she sees Dr Jarold Motto on 04/05/11 at 11:30am. She will call for problems before then; pt stated understanding.

## 2011-04-02 ENCOUNTER — Ambulatory Visit: Payer: Medicare Other | Admitting: Gastroenterology

## 2011-04-04 ENCOUNTER — Encounter (INDEPENDENT_AMBULATORY_CARE_PROVIDER_SITE_OTHER): Payer: Self-pay

## 2011-04-04 ENCOUNTER — Encounter (INDEPENDENT_AMBULATORY_CARE_PROVIDER_SITE_OTHER): Payer: Self-pay | Admitting: Surgery

## 2011-04-05 ENCOUNTER — Encounter: Payer: Self-pay | Admitting: Gastroenterology

## 2011-04-05 ENCOUNTER — Other Ambulatory Visit (INDEPENDENT_AMBULATORY_CARE_PROVIDER_SITE_OTHER): Payer: Medicare Other

## 2011-04-05 ENCOUNTER — Ambulatory Visit (INDEPENDENT_AMBULATORY_CARE_PROVIDER_SITE_OTHER): Payer: Medicare Other | Admitting: Gastroenterology

## 2011-04-05 ENCOUNTER — Ambulatory Visit: Payer: Medicare Other | Admitting: Gastroenterology

## 2011-04-05 VITALS — BP 128/82 | HR 84 | Ht 69.0 in | Wt 237.0 lb

## 2011-04-05 DIAGNOSIS — R195 Other fecal abnormalities: Secondary | ICD-10-CM | POA: Insufficient documentation

## 2011-04-05 DIAGNOSIS — Z8719 Personal history of other diseases of the digestive system: Secondary | ICD-10-CM

## 2011-04-05 DIAGNOSIS — D509 Iron deficiency anemia, unspecified: Secondary | ICD-10-CM

## 2011-04-05 DIAGNOSIS — R197 Diarrhea, unspecified: Secondary | ICD-10-CM

## 2011-04-05 DIAGNOSIS — R6889 Other general symptoms and signs: Secondary | ICD-10-CM

## 2011-04-05 DIAGNOSIS — Z9884 Bariatric surgery status: Secondary | ICD-10-CM

## 2011-04-05 LAB — FERRITIN: Ferritin: 97.7 ng/mL (ref 10.0–291.0)

## 2011-04-05 LAB — CBC WITH DIFFERENTIAL/PLATELET
Eosinophils Absolute: 0.1 10*3/uL (ref 0.0–0.7)
Eosinophils Relative: 1 % (ref 0.0–5.0)
HCT: 43.2 % (ref 36.0–46.0)
Lymphs Abs: 2.1 10*3/uL (ref 0.7–4.0)
MCHC: 32.7 g/dL (ref 30.0–36.0)
MCV: 80.8 fl (ref 78.0–100.0)
Monocytes Absolute: 0.4 10*3/uL (ref 0.1–1.0)
Neutrophils Relative %: 63.5 % (ref 43.0–77.0)
Platelets: 196 10*3/uL (ref 150.0–400.0)
RDW: 15.1 % — ABNORMAL HIGH (ref 11.5–14.6)
WBC: 7.4 10*3/uL (ref 4.5–10.5)

## 2011-04-05 LAB — BASIC METABOLIC PANEL
CO2: 29 mEq/L (ref 19–32)
Chloride: 101 mEq/L (ref 96–112)
Potassium: 3.8 mEq/L (ref 3.5–5.1)
Sodium: 139 mEq/L (ref 135–145)

## 2011-04-05 LAB — SEDIMENTATION RATE: Sed Rate: 27 mm/hr — ABNORMAL HIGH (ref 0–22)

## 2011-04-05 LAB — HIGH SENSITIVITY CRP: CRP, High Sensitivity: 8.25 mg/L — ABNORMAL HIGH (ref 0.000–5.000)

## 2011-04-05 LAB — HEPATIC FUNCTION PANEL
ALT: 19 U/L (ref 0–35)
AST: 32 U/L (ref 0–37)
Alkaline Phosphatase: 102 U/L (ref 39–117)
Bilirubin, Direct: 0.1 mg/dL (ref 0.0–0.3)
Total Bilirubin: 0.6 mg/dL (ref 0.3–1.2)

## 2011-04-05 LAB — TSH: TSH: 1.91 u[IU]/mL (ref 0.35–5.50)

## 2011-04-05 LAB — IBC PANEL: Transferrin: 235.3 mg/dL (ref 212.0–360.0)

## 2011-04-05 NOTE — Patient Instructions (Signed)
Your procedure has been scheduled for 04/08/2011, please follow the seperate instructions.  Please go to the basement today for your labs.

## 2011-04-05 NOTE — Progress Notes (Signed)
This is a very complicated 55 year old Caucasian female with chronic pain syndrome all chronic Nucynta to 5 mg 3 times a day, Duragesic patch every 3 days, Cymbalta 60 mg a day, diclofenac, and aspirin 81 mg a day. She currently is being seen because of 4 months or diarrhea partially responsive repeated courses of Cipro and metronidazole. She currently is on all the course of metronidazole with some relief of her watery diarrhea. Most her abdominal discomfort is in her left upper quadrant area, and she is status post lap band area after procedure for obesity. Review her records show the she usually has chronic functional constipation. Her records also mention previous truncal vagotomy, appendectomy, cholecystectomy, tubal ligation, partial thyroidectomy, and breast surgery. There is a diagnosis of fibromyalgia rheumatica, chronic anxiety and depression, hypertensive cardiovascular disease, and hyperlipidemia and type 2 diabetes.  Last colonoscopy was in 2009 which was unremarkable. Chart review also shows a history of sleep apnea, previous steroid dependency for her polymyalgia rheumatica, chronic lower extremity edema, he is knee replacement orthopedic surgery, previous carpal tunnel syndrome surgery, and vitamin D deficiency.  Her current diarrhea is described as a small volume watery diarrhea without gross blood or systemic complaints such as fever or chills. She apparently the past has been treated for bacterial enteritis associated with raw milk ingestion. Meds above she's better all metronidazole. Review of previous CT scan shows evidence of pancreatic atrophy mild diverticulosis, and peripheral atherosclerosis. There is no history of previous hepatitis or pancreatitis. Previous labs have shown evidence of B12 deficiency, low iron saturation, and negative celiac serologies. She denies infectious disease exposure or sick family members at home.  Current Medications, Allergies, Past Medical History, Past  Surgical History, Family History and Social History were reviewed in [employer] record.  Pertinent Review of Systems Negative... her polymyalgia rheumatica seems to be doing well. She is on probiotic therapy. She is followed closely by Dr. Antony Haste in primary care.   Physical Exam: Obese patient in no acute distress. Her abdomen is remarkable for palpable and in the mid upper abdomen area and. Otherwise there is no organomegaly, masses or tenderness. Bowel sounds are nonobstructive in nature. Rectal exam shows very scant stool present, but it is markedly guaiac positive. Cannot appreciate any fissures or fistulae or hemorrhoids. Mental status is normal.    Assessment and Plan: Probable inflammatory bowel disease versus C. difficile colitis. As mentioned above there has been some evidence in the past of possible chronic pancreatitis and B12 deficiency. I would check stool for C. difficile toxin, standard culture, O&P, and repeat flexible sigmoidoscopy with biopsies. Also labs ordered for review, and stool exam for Elastase-1 assess pancreatic exocrine function. She may need to start B12 replacement therapy. For now we will continue metronidazole since this seems to be helping clinically. No diagnosis found.

## 2011-04-08 ENCOUNTER — Other Ambulatory Visit: Payer: Medicare Other

## 2011-04-08 ENCOUNTER — Ambulatory Visit (AMBULATORY_SURGERY_CENTER): Payer: Medicare Other | Admitting: Gastroenterology

## 2011-04-08 ENCOUNTER — Encounter: Payer: Self-pay | Admitting: Gastroenterology

## 2011-04-08 VITALS — BP 121/78 | HR 65 | Temp 98.2°F | Resp 13 | Ht 69.0 in | Wt 235.0 lb

## 2011-04-08 DIAGNOSIS — R197 Diarrhea, unspecified: Secondary | ICD-10-CM

## 2011-04-08 DIAGNOSIS — R195 Other fecal abnormalities: Secondary | ICD-10-CM

## 2011-04-08 DIAGNOSIS — G894 Chronic pain syndrome: Secondary | ICD-10-CM

## 2011-04-08 DIAGNOSIS — R6889 Other general symptoms and signs: Secondary | ICD-10-CM

## 2011-04-08 DIAGNOSIS — D509 Iron deficiency anemia, unspecified: Secondary | ICD-10-CM

## 2011-04-08 LAB — GLUCOSE, CAPILLARY
Glucose-Capillary: 115 mg/dL — ABNORMAL HIGH (ref 70–99)
Glucose-Capillary: 84 mg/dL (ref 70–99)

## 2011-04-08 MED ORDER — SODIUM CHLORIDE 0.9 % IV SOLN: 500.0000 mL | INTRAVENOUS | Status: DC

## 2011-04-08 NOTE — Progress Notes (Signed)
Pt tolerated the flex sig very well. maw

## 2011-04-08 NOTE — Patient Instructions (Signed)
Dr. Jarold Motto took random colon biopsies to rule out colitis.    The results will be mailed to you within two weeks.   You may resume your routine medications.   Your colon looked normal per Dr. Jarold Motto.   If you have any questions, please call us at [phone].  Thank-you.

## 2011-04-09 ENCOUNTER — Telehealth: Payer: Self-pay

## 2011-04-09 ENCOUNTER — Telehealth: Payer: Self-pay | Admitting: *Deleted

## 2011-04-09 LAB — OVA AND PARASITE SCREEN

## 2011-04-09 LAB — CLOSTRIDIUM DIFFICILE BY PCR: Toxigenic C. Difficile by PCR: NOT DETECTED

## 2011-04-09 MED ORDER — BUDESONIDE 3 MG PO CP24: ORAL_CAPSULE | ORAL | Status: DC

## 2011-04-09 NOTE — Telephone Encounter (Signed)
Informed pt of her path results and she needs to start her Entocort asap. She will f/u with Dr Jarold Motto on 05/10/11 at 1100am. Pt stated understanding.

## 2011-04-09 NOTE — Telephone Encounter (Signed)

## 2011-04-09 NOTE — Telephone Encounter (Signed)
lmom for pt to call back. Per Dr Jarold Motto, she has Lymphocytic Colitis and needs to begin Entocort 9mg  daily x 1 month; OV in 1 month.

## 2011-04-12 LAB — STOOL CULTURE

## 2011-04-17 ENCOUNTER — Encounter: Payer: Self-pay | Admitting: Gastroenterology

## 2011-04-17 LAB — PANCREATIC ELASTASE, FECAL

## 2011-04-22 ENCOUNTER — Telehealth: Payer: Self-pay | Admitting: Gastroenterology

## 2011-04-22 DIAGNOSIS — R197 Diarrhea, unspecified: Secondary | ICD-10-CM

## 2011-04-22 MED ORDER — DIPHENOXYLATE-ATROPINE 2.5-0.025 MG PO TABS: ORAL_TABLET | ORAL | Status: DC

## 2011-04-22 NOTE — Telephone Encounter (Signed)
PROBABLY NOT A FLARE...CHECK C.DIFF, CAN USE LOMOTIL 2 TID.Marland KitchenMarland Kitchen

## 2011-04-22 NOTE — Telephone Encounter (Signed)
Informed pt she needs to have a stool cx for CDIFF and I ordered her Lomotil at [facility]- faxed; pt stated understanding.

## 2011-04-22 NOTE — Telephone Encounter (Signed)
Pt dx with Lymphocytic Colitis and was ordered Entocort 9mg  daily x 1 month on 04/09/11. Pt reports she was doing well, no diarrhea until over the weekend when the diarrhea started again and she stated she must have had 30 stools on Saturday. She took her Entocort and Lomotil and was ok until this am she woke up with a soiled bed. She took more Lomotil and Entocort this am and has had no more diarrhea; she has been drinking, but hasn't eaten yet. Can we refill her Lomotil and how should she be taking the Lomotil- 1-2 tabs up to 4x day? Thanks.

## 2011-04-24 ENCOUNTER — Other Ambulatory Visit: Payer: Medicare Other

## 2011-04-24 DIAGNOSIS — R197 Diarrhea, unspecified: Secondary | ICD-10-CM

## 2011-04-25 ENCOUNTER — Telehealth: Payer: Self-pay | Admitting: *Deleted

## 2011-04-26 ENCOUNTER — Telehealth: Payer: Self-pay | Admitting: Gastroenterology

## 2011-04-26 MED ORDER — VANCOMYCIN HCL 125 MG PO CAPS: ORAL_CAPSULE | ORAL | Status: DC

## 2011-04-26 NOTE — Telephone Encounter (Signed)
I have faxed her rx and her insurance card to see if they will cover the liquid at [facility] advised pt I would call her when i have an answer

## 2011-04-26 NOTE — Telephone Encounter (Signed)
rx was resent to [facility] they will compound for a $0 copay, left message on patients machine with all info

## 2011-04-26 NOTE — Telephone Encounter (Signed)
rx sent pt aware to stop entocort and start vanco. Also changed appt from 9/14 to 10/9.

## 2011-05-02 ENCOUNTER — Other Ambulatory Visit: Payer: Self-pay | Admitting: Family Medicine

## 2011-05-03 ENCOUNTER — Other Ambulatory Visit: Payer: Self-pay | Admitting: Family Medicine

## 2011-05-03 DIAGNOSIS — N63 Unspecified lump in unspecified breast: Secondary | ICD-10-CM

## 2011-05-08 ENCOUNTER — Ambulatory Visit
Admission: RE | Admit: 2011-05-08 | Discharge: 2011-05-08 | Disposition: A | Discharge status: Home / Self Care | Payer: Medicare Other | Source: Ambulatory Visit | Attending: Family Medicine | Admitting: Family Medicine

## 2011-05-08 ENCOUNTER — Other Ambulatory Visit: Payer: Self-pay | Admitting: Family Medicine

## 2011-05-08 DIAGNOSIS — N63 Unspecified lump in unspecified breast: Secondary | ICD-10-CM

## 2011-05-08 DIAGNOSIS — N644 Mastodynia: Secondary | ICD-10-CM

## 2011-05-10 ENCOUNTER — Ambulatory Visit: Payer: Medicare Other | Admitting: Gastroenterology

## 2011-05-23 LAB — DIFFERENTIAL
Eosinophils Relative: 2
Lymphocytes Relative: 31
Lymphs Abs: 4
Monocytes Relative: 7

## 2011-05-23 LAB — POCT I-STAT, CHEM 8
BUN: 23
Creatinine, Ser: 1.7 — ABNORMAL HIGH
Sodium: 138
TCO2: 34

## 2011-05-23 LAB — CBC
HCT: 42.4
Hemoglobin: 14.6
Platelets: 262
RBC: 5.09
WBC: 12.9 — ABNORMAL HIGH

## 2011-05-30 LAB — URINALYSIS, ROUTINE W REFLEX MICROSCOPIC
Glucose, UA: NEGATIVE mg/dL
Hgb urine dipstick: NEGATIVE
Ketones, ur: NEGATIVE mg/dL
Nitrite: NEGATIVE
Protein, ur: NEGATIVE mg/dL
Specific Gravity, Urine: 1.028 (ref 1.005–1.030)
Urobilinogen, UA: 1 mg/dL (ref 0.0–1.0)
pH: 5 (ref 5.0–8.0)

## 2011-05-30 LAB — DIFFERENTIAL
Basophils Absolute: 0.2 K/uL — ABNORMAL HIGH (ref 0.0–0.1)
Basophils Relative: 2 % — ABNORMAL HIGH (ref 0–1)
Eosinophils Absolute: 0.4 K/uL (ref 0.0–0.7)
Eosinophils Relative: 4 % (ref 0–5)
Lymphocytes Relative: 31 % (ref 12–46)
Lymphs Abs: 3.4 K/uL (ref 0.7–4.0)
Monocytes Absolute: 0.9 K/uL (ref 0.1–1.0)
Monocytes Relative: 8 % (ref 3–12)
Neutro Abs: 6.1 K/uL (ref 1.7–7.7)
Neutrophils Relative %: 56 % (ref 43–77)

## 2011-05-30 LAB — COMPREHENSIVE METABOLIC PANEL
ALT: 10 U/L (ref 0–35)
CO2: 29 mEq/L (ref 19–32)
Calcium: 9.8 mg/dL (ref 8.4–10.5)
Chloride: 100 mEq/L (ref 96–112)
Creatinine, Ser: 0.87 mg/dL (ref 0.4–1.2)
GFR calc non Af Amer: >60 mL/min (ref >60)
Glucose, Bld: 82 mg/dL (ref 70–99)
Sodium: 136 mEq/L (ref 135–145)
Total Bilirubin: 0.8 mg/dL (ref 0.3–1.2)

## 2011-05-30 LAB — CBC
Hemoglobin: 13.2 g/dL (ref 12.0–15.0)
MCHC: 33.2 g/dL (ref 30.0–36.0)
MCV: 82.6 fL (ref 78.0–100.0)
RBC: 4.8 MIL/uL (ref 3.87–5.11)

## 2011-05-30 LAB — PROTIME-INR
INR: 1 (ref 0.00–1.49)
Prothrombin Time: 13.9 seconds (ref 11.6–15.2)

## 2011-05-31 LAB — BASIC METABOLIC PANEL
BUN: 15
CO2: 32
Calcium: 9.5
Chloride: 95 — ABNORMAL LOW
Creatinine, Ser: 0.71
Glucose, Bld: 101 — ABNORMAL HIGH

## 2011-05-31 LAB — CBC
MCHC: 33.7
MCV: 86.5
RDW: 13.8

## 2011-05-31 LAB — HCG, SERUM, QUALITATIVE: Preg, Serum: NEGATIVE

## 2011-06-03 LAB — I-STAT 8, (EC8 V) (CONVERTED LAB)
BUN: 10
Bicarbonate: 32.8 — ABNORMAL HIGH
Glucose, Bld: 136 — ABNORMAL HIGH
HCT: 44
Hemoglobin: 15
Operator id: 272551
Potassium: 3.8
Sodium: 138
TCO2: 35

## 2011-06-03 LAB — POCT CARDIAC MARKERS
Operator id: 272551
Troponin i, poc: <0.05

## 2011-06-04 ENCOUNTER — Ambulatory Visit: Payer: Medicare Other | Admitting: Gastroenterology

## 2011-06-07 ENCOUNTER — Encounter: Payer: Self-pay | Admitting: *Deleted

## 2011-06-07 ENCOUNTER — Telehealth: Payer: Self-pay | Admitting: Gastroenterology

## 2011-06-07 NOTE — Telephone Encounter (Signed)
Message copied by Arna Snipe on Fri Jun 07, 2011  2:18 PM ------      Message from: Donata Duff      Created: Tue Jun 04, 2011  2:27 PM       do not bill

## 2011-06-11 ENCOUNTER — Telehealth: Payer: Self-pay | Admitting: Gastroenterology

## 2011-06-11 ENCOUNTER — Ambulatory Visit: Payer: Medicare Other | Admitting: Gastroenterology

## 2011-06-11 NOTE — Telephone Encounter (Signed)
Patient also n/s last appointment. Dr Jarold Motto, would you like me to charge late cancellation fee?

## 2011-06-11 NOTE — Telephone Encounter (Signed)
yes

## 2011-06-13 LAB — PROTIME-INR
INR: 1.1
INR: 1.1
INR: 1.4
Prothrombin Time: 14.3
Prothrombin Time: 14.9
Prothrombin Time: 18.1 — ABNORMAL HIGH

## 2011-06-13 LAB — BASIC METABOLIC PANEL
BUN: 15
CO2: 35 — ABNORMAL HIGH
Calcium: 9.1
Calcium: 9.2
Creatinine, Ser: 0.9
Creatinine, Ser: 0.94
GFR calc Af Amer: >60
GFR calc Af Amer: >60
GFR calc non Af Amer: >60
GFR calc non Af Amer: >60
Glucose, Bld: 136 — ABNORMAL HIGH
Sodium: 139

## 2011-06-13 LAB — CBC
MCHC: 32.2
Platelets: 218
RBC: 4.21
RDW: 14.6 — ABNORMAL HIGH
WBC: 16 — ABNORMAL HIGH

## 2011-08-02 ENCOUNTER — Telehealth: Payer: Self-pay | Admitting: *Deleted

## 2011-08-02 MED ORDER — PANCRELIPASE (LIP-PROT-AMYL) 24000-76000 UNITS PO CPEP: ORAL_CAPSULE | ORAL | Status: DC

## 2011-08-02 NOTE — Telephone Encounter (Signed)
rx sent to pharm and ov to be scheduled when schedule is in the pc.

## 2011-08-02 NOTE — Telephone Encounter (Signed)
Message copied by Leonette Monarch on Fri Aug 02, 2011  1:37 PM ------      Message from: PATTERSON, DAVID R      Created: Fri Aug 02, 2011 12:12 PM       Needs Creon tablets 2 by mouth 3 times a day with meals or generic equivalent. Office followup in 2 months time.

## 2011-09-07 DIAGNOSIS — Z23 Encounter for immunization: Secondary | ICD-10-CM | POA: Diagnosis not present

## 2011-09-12 DIAGNOSIS — E039 Hypothyroidism, unspecified: Secondary | ICD-10-CM | POA: Diagnosis not present

## 2011-09-12 DIAGNOSIS — Z79899 Other long term (current) drug therapy: Secondary | ICD-10-CM | POA: Diagnosis not present

## 2011-09-13 DIAGNOSIS — C73 Malignant neoplasm of thyroid gland: Secondary | ICD-10-CM | POA: Diagnosis not present

## 2011-09-13 DIAGNOSIS — E890 Postprocedural hypothyroidism: Secondary | ICD-10-CM | POA: Diagnosis not present

## 2011-09-16 DIAGNOSIS — N951 Menopausal and female climacteric states: Secondary | ICD-10-CM | POA: Diagnosis not present

## 2011-09-16 DIAGNOSIS — R300 Dysuria: Secondary | ICD-10-CM | POA: Diagnosis not present

## 2011-09-16 DIAGNOSIS — I10 Essential (primary) hypertension: Secondary | ICD-10-CM | POA: Diagnosis not present

## 2011-09-16 DIAGNOSIS — F411 Generalized anxiety disorder: Secondary | ICD-10-CM | POA: Diagnosis not present

## 2011-10-04 DIAGNOSIS — R339 Retention of urine, unspecified: Secondary | ICD-10-CM | POA: Diagnosis not present

## 2011-10-04 DIAGNOSIS — N3946 Mixed incontinence: Secondary | ICD-10-CM | POA: Diagnosis not present

## 2011-10-14 DIAGNOSIS — N3946 Mixed incontinence: Secondary | ICD-10-CM | POA: Diagnosis not present

## 2011-10-14 DIAGNOSIS — R350 Frequency of micturition: Secondary | ICD-10-CM | POA: Diagnosis not present

## 2011-10-14 DIAGNOSIS — N390 Urinary tract infection, site not specified: Secondary | ICD-10-CM | POA: Diagnosis not present

## 2011-10-17 ENCOUNTER — Telehealth: Payer: Self-pay | Admitting: Cardiology

## 2011-10-17 NOTE — Telephone Encounter (Signed)
Patient called stating her heart rate is slow at night 40 beats/min to 50 beats/min.Stated her husband says her sleep apnea has returned.Advised to see her PCP.

## 2011-10-17 NOTE — Telephone Encounter (Signed)
Pt calling re heart rate and BP being low

## 2011-10-24 DIAGNOSIS — G4700 Insomnia, unspecified: Secondary | ICD-10-CM | POA: Diagnosis not present

## 2011-10-24 DIAGNOSIS — R7309 Other abnormal glucose: Secondary | ICD-10-CM | POA: Diagnosis not present

## 2011-10-24 DIAGNOSIS — L989 Disorder of the skin and subcutaneous tissue, unspecified: Secondary | ICD-10-CM | POA: Diagnosis not present

## 2011-10-24 DIAGNOSIS — I10 Essential (primary) hypertension: Secondary | ICD-10-CM | POA: Diagnosis not present

## 2011-10-24 DIAGNOSIS — E039 Hypothyroidism, unspecified: Secondary | ICD-10-CM | POA: Diagnosis not present

## 2011-10-24 DIAGNOSIS — R0989 Other specified symptoms and signs involving the circulatory and respiratory systems: Secondary | ICD-10-CM | POA: Diagnosis not present

## 2011-10-24 DIAGNOSIS — R0609 Other forms of dyspnea: Secondary | ICD-10-CM | POA: Diagnosis not present

## 2011-10-24 DIAGNOSIS — F411 Generalized anxiety disorder: Secondary | ICD-10-CM | POA: Diagnosis not present

## 2011-10-25 ENCOUNTER — Encounter: Payer: Self-pay | Admitting: Gastroenterology

## 2011-10-25 DIAGNOSIS — R1900 Intra-abdominal and pelvic swelling, mass and lump, unspecified site: Secondary | ICD-10-CM | POA: Diagnosis not present

## 2011-11-06 DIAGNOSIS — N3946 Mixed incontinence: Secondary | ICD-10-CM | POA: Diagnosis not present

## 2011-11-06 DIAGNOSIS — N9489 Other specified conditions associated with female genital organs and menstrual cycle: Secondary | ICD-10-CM | POA: Diagnosis not present

## 2011-11-11 DIAGNOSIS — D2370 Other benign neoplasm of skin of unspecified lower limb, including hip: Secondary | ICD-10-CM | POA: Diagnosis not present

## 2011-11-11 DIAGNOSIS — L219 Seborrheic dermatitis, unspecified: Secondary | ICD-10-CM | POA: Diagnosis not present

## 2011-11-15 DIAGNOSIS — M25519 Pain in unspecified shoulder: Secondary | ICD-10-CM | POA: Diagnosis not present

## 2011-11-15 DIAGNOSIS — M25569 Pain in unspecified knee: Secondary | ICD-10-CM | POA: Diagnosis not present

## 2011-11-15 DIAGNOSIS — Z882 Allergy status to sulfonamides status: Secondary | ICD-10-CM | POA: Diagnosis not present

## 2011-11-15 DIAGNOSIS — M353 Polymyalgia rheumatica: Secondary | ICD-10-CM | POA: Diagnosis not present

## 2011-11-15 DIAGNOSIS — I10 Essential (primary) hypertension: Secondary | ICD-10-CM | POA: Diagnosis not present

## 2011-11-15 DIAGNOSIS — G8929 Other chronic pain: Secondary | ICD-10-CM | POA: Diagnosis not present

## 2011-11-15 DIAGNOSIS — M533 Sacrococcygeal disorders, not elsewhere classified: Secondary | ICD-10-CM | POA: Diagnosis not present

## 2011-11-15 DIAGNOSIS — J449 Chronic obstructive pulmonary disease, unspecified: Secondary | ICD-10-CM | POA: Diagnosis not present

## 2011-11-15 DIAGNOSIS — M542 Cervicalgia: Secondary | ICD-10-CM | POA: Diagnosis not present

## 2011-11-15 DIAGNOSIS — Z79899 Other long term (current) drug therapy: Secondary | ICD-10-CM | POA: Diagnosis not present

## 2011-11-15 DIAGNOSIS — E119 Type 2 diabetes mellitus without complications: Secondary | ICD-10-CM | POA: Diagnosis not present

## 2011-11-21 DIAGNOSIS — E890 Postprocedural hypothyroidism: Secondary | ICD-10-CM | POA: Diagnosis not present

## 2011-11-21 DIAGNOSIS — C73 Malignant neoplasm of thyroid gland: Secondary | ICD-10-CM | POA: Diagnosis not present

## 2011-11-21 DIAGNOSIS — E119 Type 2 diabetes mellitus without complications: Secondary | ICD-10-CM | POA: Diagnosis not present

## 2011-12-20 DIAGNOSIS — M25519 Pain in unspecified shoulder: Secondary | ICD-10-CM | POA: Diagnosis not present

## 2011-12-20 DIAGNOSIS — M542 Cervicalgia: Secondary | ICD-10-CM | POA: Diagnosis not present

## 2011-12-20 DIAGNOSIS — M353 Polymyalgia rheumatica: Secondary | ICD-10-CM | POA: Diagnosis not present

## 2011-12-20 DIAGNOSIS — Z79899 Other long term (current) drug therapy: Secondary | ICD-10-CM | POA: Diagnosis not present

## 2011-12-20 DIAGNOSIS — M129 Arthropathy, unspecified: Secondary | ICD-10-CM | POA: Diagnosis not present

## 2011-12-20 DIAGNOSIS — F3289 Other specified depressive episodes: Secondary | ICD-10-CM | POA: Diagnosis not present

## 2011-12-20 DIAGNOSIS — M545 Low back pain, unspecified: Secondary | ICD-10-CM | POA: Diagnosis not present

## 2011-12-20 DIAGNOSIS — F329 Major depressive disorder, single episode, unspecified: Secondary | ICD-10-CM | POA: Diagnosis not present

## 2011-12-20 DIAGNOSIS — M25569 Pain in unspecified knee: Secondary | ICD-10-CM | POA: Diagnosis not present

## 2011-12-20 DIAGNOSIS — IMO0001 Reserved for inherently not codable concepts without codable children: Secondary | ICD-10-CM | POA: Diagnosis not present

## 2012-01-02 ENCOUNTER — Ambulatory Visit: Payer: Medicare Other | Admitting: *Deleted

## 2012-01-08 ENCOUNTER — Encounter: Payer: Self-pay | Admitting: *Deleted

## 2012-01-24 DIAGNOSIS — M533 Sacrococcygeal disorders, not elsewhere classified: Secondary | ICD-10-CM | POA: Diagnosis not present

## 2012-01-27 DIAGNOSIS — E890 Postprocedural hypothyroidism: Secondary | ICD-10-CM | POA: Diagnosis not present

## 2012-02-03 DIAGNOSIS — IMO0002 Reserved for concepts with insufficient information to code with codable children: Secondary | ICD-10-CM | POA: Diagnosis not present

## 2012-02-03 DIAGNOSIS — R350 Frequency of micturition: Secondary | ICD-10-CM | POA: Diagnosis not present

## 2012-02-03 DIAGNOSIS — N9489 Other specified conditions associated with female genital organs and menstrual cycle: Secondary | ICD-10-CM | POA: Diagnosis not present

## 2012-03-03 DIAGNOSIS — IMO0002 Reserved for concepts with insufficient information to code with codable children: Secondary | ICD-10-CM | POA: Diagnosis not present

## 2012-03-03 DIAGNOSIS — R279 Unspecified lack of coordination: Secondary | ICD-10-CM | POA: Diagnosis not present

## 2012-03-03 DIAGNOSIS — M6281 Muscle weakness (generalized): Secondary | ICD-10-CM | POA: Diagnosis not present

## 2012-03-03 DIAGNOSIS — N3946 Mixed incontinence: Secondary | ICD-10-CM | POA: Diagnosis not present

## 2012-03-06 DIAGNOSIS — Z79899 Other long term (current) drug therapy: Secondary | ICD-10-CM | POA: Diagnosis not present

## 2012-03-06 DIAGNOSIS — M542 Cervicalgia: Secondary | ICD-10-CM | POA: Diagnosis not present

## 2012-03-06 DIAGNOSIS — G8929 Other chronic pain: Secondary | ICD-10-CM | POA: Diagnosis not present

## 2012-03-06 DIAGNOSIS — M4310 Spondylolisthesis, site unspecified: Secondary | ICD-10-CM | POA: Diagnosis not present

## 2012-03-06 DIAGNOSIS — M5137 Other intervertebral disc degeneration, lumbosacral region: Secondary | ICD-10-CM | POA: Diagnosis not present

## 2012-03-06 DIAGNOSIS — M5030 Other cervical disc degeneration, unspecified cervical region: Secondary | ICD-10-CM | POA: Diagnosis not present

## 2012-03-06 DIAGNOSIS — M545 Low back pain, unspecified: Secondary | ICD-10-CM | POA: Diagnosis not present

## 2012-03-06 DIAGNOSIS — Z96659 Presence of unspecified artificial knee joint: Secondary | ICD-10-CM | POA: Diagnosis not present

## 2012-03-06 DIAGNOSIS — M129 Arthropathy, unspecified: Secondary | ICD-10-CM | POA: Diagnosis not present

## 2012-03-06 DIAGNOSIS — IMO0001 Reserved for inherently not codable concepts without codable children: Secondary | ICD-10-CM | POA: Diagnosis not present

## 2012-03-06 DIAGNOSIS — Z882 Allergy status to sulfonamides status: Secondary | ICD-10-CM | POA: Diagnosis not present

## 2012-03-06 DIAGNOSIS — M47817 Spondylosis without myelopathy or radiculopathy, lumbosacral region: Secondary | ICD-10-CM | POA: Diagnosis not present

## 2012-03-23 DIAGNOSIS — M6281 Muscle weakness (generalized): Secondary | ICD-10-CM | POA: Diagnosis not present

## 2012-03-23 DIAGNOSIS — R279 Unspecified lack of coordination: Secondary | ICD-10-CM | POA: Diagnosis not present

## 2012-03-23 DIAGNOSIS — IMO0002 Reserved for concepts with insufficient information to code with codable children: Secondary | ICD-10-CM | POA: Diagnosis not present

## 2012-03-23 DIAGNOSIS — N3946 Mixed incontinence: Secondary | ICD-10-CM | POA: Diagnosis not present

## 2012-03-30 DIAGNOSIS — R279 Unspecified lack of coordination: Secondary | ICD-10-CM | POA: Diagnosis not present

## 2012-03-30 DIAGNOSIS — IMO0002 Reserved for concepts with insufficient information to code with codable children: Secondary | ICD-10-CM | POA: Diagnosis not present

## 2012-03-30 DIAGNOSIS — M6281 Muscle weakness (generalized): Secondary | ICD-10-CM | POA: Diagnosis not present

## 2012-03-30 DIAGNOSIS — N3946 Mixed incontinence: Secondary | ICD-10-CM | POA: Diagnosis not present

## 2012-04-09 DIAGNOSIS — M6281 Muscle weakness (generalized): Secondary | ICD-10-CM | POA: Diagnosis not present

## 2012-04-09 DIAGNOSIS — N3946 Mixed incontinence: Secondary | ICD-10-CM | POA: Diagnosis not present

## 2012-04-09 DIAGNOSIS — IMO0002 Reserved for concepts with insufficient information to code with codable children: Secondary | ICD-10-CM | POA: Diagnosis not present

## 2012-04-09 DIAGNOSIS — R279 Unspecified lack of coordination: Secondary | ICD-10-CM | POA: Diagnosis not present

## 2012-04-16 ENCOUNTER — Other Ambulatory Visit: Payer: Self-pay | Admitting: Urology

## 2012-04-16 DIAGNOSIS — R350 Frequency of micturition: Secondary | ICD-10-CM | POA: Diagnosis not present

## 2012-04-16 DIAGNOSIS — IMO0002 Reserved for concepts with insufficient information to code with codable children: Secondary | ICD-10-CM | POA: Diagnosis not present

## 2012-04-17 ENCOUNTER — Encounter (HOSPITAL_COMMUNITY): Payer: Self-pay | Admitting: Emergency Medicine

## 2012-04-17 ENCOUNTER — Emergency Department (INDEPENDENT_AMBULATORY_CARE_PROVIDER_SITE_OTHER)
Admission: EM | Admit: 2012-04-17 | Discharge: 2012-04-17 | Disposition: A | Discharge status: Home / Self Care | Payer: Medicare Other | Source: Home / Self Care | Attending: Emergency Medicine | Admitting: Emergency Medicine

## 2012-04-17 DIAGNOSIS — L259 Unspecified contact dermatitis, unspecified cause: Secondary | ICD-10-CM

## 2012-04-17 MED ORDER — HYDROXYZINE HCL 25 MG PO TABS: 25.0000 mg | ORAL_TABLET | Freq: Four times a day (QID) | ORAL | Status: AC

## 2012-04-17 MED ORDER — METHYLPREDNISOLONE ACETATE 40 MG/ML IJ SUSP
INTRAMUSCULAR | Status: AC
  Filled 2012-04-17: qty 5

## 2012-04-17 MED ORDER — METHYLPREDNISOLONE ACETATE 80 MG/ML IJ SUSP
120.0000 mg | Freq: Once | INTRAMUSCULAR | Status: AC
  Administered 2012-04-17: 120 mg via INTRAMUSCULAR

## 2012-04-17 MED ORDER — METHYLPREDNISOLONE ACETATE 80 MG/ML IJ SUSP
INTRAMUSCULAR | Status: AC
  Filled 2012-04-17: qty 1

## 2012-04-17 MED ORDER — HYDROXYZINE HCL 25 MG PO TABS: 25.0000 mg | ORAL_TABLET | Freq: Four times a day (QID) | ORAL | Status: DC

## 2012-04-17 NOTE — ED Notes (Signed)
Pt was working in the yard last week and that evening noted a rash on her right leg an right side.  Since then the rash has spread to both arms, both legs, and behind her ears.  Pt sts it is itchy, appears red and scattered; denies any drainage from rash.

## 2012-04-17 NOTE — ED Provider Notes (Signed)
History     CSN: [id number]  Arrival date & time 04/17/12  1752   First MD Initiated Contact with Patient 04/17/12 1800      Chief Complaint  Patient presents with  . Rash    (Consider location/radiation/quality/duration/timing/severity/associated sxs/prior treatment) HPI Comments: Patient presents this evening to urgent care complaining of an itchy rash several areas of her body including her right leg right upper torso left forearm rash has been spreading to both of her years and has some behind her legs as well. It's itchy red scattered. Patient describes that she was working on her yard last week and his rash has been getting progressively worse. Patient denies any other symptoms such as fevers, generalized malaise, myalgias arthralgias or changes in appetite.  Patient is a 56 y.o. female presenting with rash. The history is provided by the patient.  Rash  The current episode started more than 1 week ago. The problem has not changed since onset.There has been no fever. The rash is present on the torso, trunk and left buttock. The pain is at a severity of 5/10. The pain is moderate. Associated symptoms include itching. Pertinent negatives include no weeping. She has tried nothing for the symptoms. The treatment provided no relief.    Past Medical History  Diagnosis Date  . TIA (transient ischemic attack) 11/13/10    carotid doppler-mild (B) plaque formation  . Anxiety     with panic attacks  . Insomnia   . Chronic pain     followed by pain clinic @ [facility]  . Diabetes mellitus     insulin-dependent  . HTN (hypertension)   . Left arm pain   . COPD (chronic obstructive pulmonary disease)   . Osteoarthritis     with severe disease in knee  . Hypercholesterolemia   . Chronic back pain     Osteoarthritis  . History of pneumonia   . History of chest pain   . Tobacco abuse   . History of echocardiogram 12/24/2006    EF was 55-60%  / it did show mild LVH, otherwise was normal      . Mixed stress and urge urinary incontinence 09/29/2010    she had tension-free vaginal tape followed by cystoscopy  . Polymyalgia rheumatica   . Thyroid cancer   . Fatty liver disease, nonalcoholic   . Scoliosis   . Postmenopausal bleeding   . GERD (gastroesophageal reflux disease)   . Morbid obesity 07/70/07    underwent -- LAPAROSCOPIC GASTRIC BANDING  . Cholelithiasis 06/10/2002    Laparoscopic cholecystectomy with intraoperative cholangiogram.  . Obstructive sleep apnea 04/04/2005    underwent -- NOCTURNAL POLYSOMNOGRAM: 1. Moderately severe obstructive sleep apnea/hypopnea syndrome, respiratory disturbance index 40.6 per hour with loud snoring & oxygen desaturation to 71%./ 2.Successful C-PAP titration to 16 centimeter of water pressure, respiratory disturbance index 0 per hour, using a medium comfort full to mask with heated humidifier.   . Asthma   . Depression   . C. difficile colitis 2012  . Hemorrhoids     Past Surgical History  Procedure Date  . Thyroidectomy 2005    for thyroid cancer  . Breast lumpectomy 2007    (benign)  . Appendectomy 1982  . Knee surgery 2007    Revision left total knee arthroplasty, poly revision / Left total knee arthroplasty with progressive  painful ligamentous laxity   . Cardiac catheterization 02/02/2005    EF is estimated at 60% /  Normal coronary anatomy / Normal left  ventricular function   . Cystoscopy 09/29/2010    placement of  tension-free vaginal tape followed by cystoscopy, tolerating procedure well  . Tonsillectomy 1969  . Tubal ligation 1983  . Hysteroscopy w/d&c 05/16/2003  . Leep 05/16/2003    Loop electrosurgical excision procedure  . Cryoablation 05/16/2003    cryoablation of her cervix  . Laparoscopic gastric banding 03/01/2006    Laparoscopic truncal vagotomy, endoscopy, laparoscopic placement of a VG band  . Cholecystectomy, laparoscopic 06/10/2002     with intraoperative cholangiogram / due to Cholelithiasis  . Knee  arthroscopy w/ debridement 03/29/2006    Left knee arthroscopy with debridement of significant  chondromalacia of the medial compartment and the patellofemoral joint including the trochlea.  . Bladder suspension     Family History  Problem Relation Age of Onset  . Colon cancer Maternal Uncle     x 2  . Breast cancer Other     great aunts x 5  . Diabetes Mother   . Heart disease Father   . Heart disease Mother     History  Substance Use Topics  . Smoking status: Former Smoker    Quit date: 10/22/2010  . Smokeless tobacco: Not on file  . Alcohol Use: No    OB History    Grav Para Term Preterm Abortions TAB SAB Ect Mult Living                  Review of Systems  Constitutional: Negative for fever, chills, activity change, appetite change and fatigue.  Skin: Positive for itching and rash. Negative for pallor.    Allergies  Sulfonamide derivatives  Home Medications   Current Outpatient Rx  Name Route Sig Dispense Refill  . ENABLEX 7.5 MG PO TB24      . FENTANYL 75 MCG/HR TD PT72 Transdermal Place 1 patch onto the skin every 3 (three) days.      Marland Kitchen LEVOTHYROXINE SODIUM 112 MCG PO TABS Oral Take 112 mcg by mouth daily.      . ASPIRIN 81 MG PO TABS Oral Take 81 mg by mouth daily as needed.     Marland Kitchen BACLOFEN 10 MG PO TABS      . CELEBREX 200 MG PO CAPS      . CHOLESTYRAMINE 4 G PO PACK      . CIPROFLOXACIN HCL 500 MG PO TABS Oral Take 500 mg by mouth 2 (two) times daily. X 10 days     . DICLOFENAC EPOLAMINE 1.3 % TD PTCH Transdermal Place 1 patch onto the skin 2 (two) times daily.      Marland Kitchen DIPHENOXYLATE-ATROPINE 2.5-0.025 MG PO TABS  Take 2 tablets by mouth up to 3 times daily when needed for diarrhea. 60 tablet 0  . DULOXETINE HCL 60 MG PO CPEP Oral Take 60 mg by mouth 2 (two) times daily.     Marland Kitchen HYDROXYZINE HCL 25 MG PO TABS Oral Take 1 tablet (25 mg total) by mouth every 6 (six) hours. 12 tablet 0  . KLOR-CON 8 MEQ PO TBCR      . LIDOCAINE 5 % EX PTCH Transdermal Place 1  patch onto the skin daily. Remove & Discard patch within 12 hours or as directed by MD     . METRONIDAZOLE 500 MG PO TABS Oral Take 500 mg by mouth 2 (two) times daily. X 10 days     . PANCRELIPASE (LIP-PROT-AMYL) 24000 UNITS PO CPEP  Take two tablets by mouth three times a day with  meals. 180 capsule 3  . POTASSIUM 75 MG PO TABS Oral Take 1 tablet by mouth daily.      Marland Kitchen ALIGN 4 MG PO CAPS Oral Take 1 capsule by mouth daily. 28 capsule 0    Lot # [id number] Exp date: 02/2012  . PROMETHAZINE HCL 25 MG PO TABS Oral Take 25 mg by mouth every 6 (six) hours as needed.      Marland Kitchen SOLIFENACIN SUCCINATE 5 MG PO TABS Oral Take 10 mg by mouth daily.      Marland Kitchen TAPENTADOL HCL 75 MG PO TABS Oral Take by mouth 3 (three) times daily as needed.     Marland Kitchen VANCOMYCIN HCL 125 MG PO CAPS  Take one tablet by mouth four times a day for 2 weeks, then take one tab three times a day for 2 weeks, then take one tab twice a day for 2 weeks, then take one tab once a day for 2 weeks. 140 capsule 0    BP 168/96  Pulse 58  Temp 98.6 F (37 C) (Oral)  Resp 20  SpO2 97%  Physical Exam  Nursing note and vitals reviewed. Constitutional: Vital signs are normal. She appears well-developed and well-nourished.  Non-toxic appearance. She does not have a sickly appearance. No distress.  Skin: Rash noted. Rash is papular. Rash is not macular. There is erythema.       ED Course  Procedures (including critical care time)  Labs Reviewed - No data to display No results found.   1. Contact dermatitis       MDM  Poison ivy and is chondrodermatitis. Patient was provided with a Depo-Medrol shot and prescriptions for hydroxyzine.        Jimmie Molly, MD 04/17/12 2130

## 2012-04-23 DIAGNOSIS — N390 Urinary tract infection, site not specified: Secondary | ICD-10-CM | POA: Diagnosis not present

## 2012-04-23 DIAGNOSIS — IMO0002 Reserved for concepts with insufficient information to code with codable children: Secondary | ICD-10-CM | POA: Diagnosis not present

## 2012-05-08 ENCOUNTER — Encounter (HOSPITAL_BASED_OUTPATIENT_CLINIC_OR_DEPARTMENT_OTHER): Payer: Self-pay | Admitting: *Deleted

## 2012-05-08 NOTE — Progress Notes (Addendum)
NPO AFTER MN. ARRIVES AT 1030. NEEDS ISTAT AND EKG. WILL TAKE NUCYNTA AND SYNTHROID AM OF SURG W/ SIP OF WATER AND BRING INHALER.  REVIEWED CHART W/ DR CARIGNIN MDA, OK TO PROCEED, NO CXR NEEDED UNLESS PT SYMPTOMATIC DOS.

## 2012-05-11 NOTE — H&P (Signed)
History of Present Illness   [name] thought she had a urinary tract infection a few days ago with pain with urination and increased blood sugar. The symptoms settled down. They were moderate in severity and she really thought she had an infection. She is having surgery soon so I wanted her to come in.  Review of systems: No change in bowel or neurologic status. There was no other aggravating or relieving factors. The symptoms were mild to moderate in severity and came on fairly suddenly.  Review of systems: No change in bowel or neurologic status.   Urinalysis: I reviewed, ____    Past Medical History Problems  1. History of  Diabetes Mellitus 250.00 2. Former Smoker V15.82  Surgical History Problems  1. History of  Dilation And Curettage 2. History of  Laser Ablation Of Cervix  Current Meds 1. ClonazePAM 1 MG Oral Tablet; Therapy: 23Jan2013 to 2. Cymbalta 60 MG Oral Capsule Delayed Release Particles; Therapy: 19Dec2012 to 3. Enablex 7.5 MG Oral Tablet Extended Release 24 Hour; TAKE 7.5 MG Daily; Therapy: 11Jul2012  to (Evaluate:24Jul2014)  Requested for: 30Jul2013; Last Rx:29Jul2013 4. EpiPen 2-Pak 0.3 MG/0.3ML Injection Device; Therapy: 23Feb2012 to 5. FentaNYL 75 MCG/HR Transdermal Patch 72 Hour; Therapy: 07Sep2011 to 6. Flector 1.3 % Transdermal Patch; Therapy: 01Nov2011 to 7. HydrOXYzine HCl 10 MG Oral Tablet; Therapy: 18Mar2013 to 8. Klor-Con 8 MEQ Oral Tablet Extended Release; Therapy: 28Jun2012 to 9. Lidoderm 5 % External Patch; Therapy: 23Feb2012 to 10. Lisinopril 20 MG Oral Tablet; Therapy: 21Jan2013 to 11. Meloxicam 7.5 MG Oral Tablet; Therapy: 19Apr2013 to 12. Nucynta 75 MG Oral Tablet; Therapy: 26Jan2012 to 13. ProAir HFA 108 (90 Base) MCG/ACT Inhalation Aerosol Solution; Therapy: 15Dec2011 to 14. Synthroid 112 MCG Oral Tablet; Therapy: 04Jun2013 to 15. TiZANidine HCl 2 MG Oral Tablet; Therapy: 22Mar2013 to 16. Triamcinolone Acetonide 0.025 % External  Cream; Therapy: 21Jan2013 to 17. Voltaren 1 % Transdermal Gel; Therapy: 27Aug2011 to 18. Zolpidem Tartrate 5 MG Oral Tablet; Therapy: 19Jan2012 to 19. Zolpidem Tartrate ER 12.5 MG Oral Tablet Extended Release; Therapy: 20Mar2012 to  Allergies Medication  1. Sulfa Drugs  Social History Problems  1. Activities Of Daily Living 2. Exercise Habits Patient is not very active at this time I do with any active exercise or chores. She feels that she is not interested in any activity around the home and has been quite concerned and depressed about her issues. 3. Living Independently With Spouse 4. Self-reliant In Usual Daily Activities  Vitals Vital Signs [Data Includes: Last 1 Day]  29Aug2013 01:24PM  Blood Pressure: 134 / 78 Temperature: 98.9 F Heart Rate: 76  Results/Data Urine [Data Includes: Last 1 Day]   29Aug2013  COLOR YELLOW   APPEARANCE CLEAR   SPECIFIC GRAVITY >1.030   pH 5.5   GLUCOSE NEG mg/dL  BILIRUBIN SMALL   KETONE NEG mg/dL  BLOOD NEG   PROTEIN NEG mg/dL  UROBILINOGEN 1 mg/dL  NITRITE NEG   LEUKOCYTE ESTERASE NEG    Assessment Assessed  1. Dyspareunia 625.0 2. Urinary Tract Infection 599.0  Plan Health Maintenance (V70.0)  1. UA With REFLEX  Done: 29Aug2013 01:09PM  Discussion/Summary   She has had positive cultures in the past. Clinically she is not infected today and I will call her in 2 days if it is positive. I believe her cystoscopy under anesthesia is on the 17th.  After a thorough review of the management options for the patient's condition the patient  elected to proceed with surgical therapy  as noted above. We have discussed the potential benefits and risks of the procedure, side effects of the proposed treatment, the likelihood of the patient achieving the goals of the procedure, and any potential problems that might occur during the procedure or recuperation. Informed consent has been obtained.

## 2012-05-12 ENCOUNTER — Encounter (HOSPITAL_BASED_OUTPATIENT_CLINIC_OR_DEPARTMENT_OTHER)
Admission: RE | Disposition: A | Discharge status: Home / Self Care | Payer: Self-pay | Source: Ambulatory Visit | Attending: Urology

## 2012-05-12 ENCOUNTER — Ambulatory Visit (HOSPITAL_BASED_OUTPATIENT_CLINIC_OR_DEPARTMENT_OTHER)
Admission: RE | Admit: 2012-05-12 | Discharge: 2012-05-12 | Disposition: A | Discharge status: Home / Self Care | Payer: Medicare Other | Source: Ambulatory Visit | Attending: Urology | Admitting: Urology

## 2012-05-12 HISTORY — DX: Frequency of micturition: R35.0

## 2012-05-12 HISTORY — DX: Pelvic and perineal pain unspecified side: R10.20

## 2012-05-12 HISTORY — DX: Spinal stenosis, lumbar region without neurogenic claudication: M48.061

## 2012-05-12 HISTORY — DX: Pelvic and perineal pain: R10.2

## 2012-05-12 HISTORY — DX: Nocturia: R35.1

## 2012-05-12 HISTORY — DX: Obstructive sleep apnea (adult) (pediatric): G47.33

## 2012-05-12 HISTORY — DX: Personal history of transient ischemic attack (TIA), and cerebral infarction without residual deficits: Z86.73

## 2012-05-12 HISTORY — DX: Opioid use, unspecified, uncomplicated: F11.90

## 2012-05-12 HISTORY — DX: Hyperlipidemia, unspecified: E78.5

## 2012-05-12 HISTORY — DX: Unspecified osteoarthritis, unspecified site: M19.90

## 2012-05-12 HISTORY — DX: Urgency of urination: R39.15

## 2012-05-12 HISTORY — DX: Chronic pain syndrome: G89.4

## 2012-05-12 HISTORY — DX: Other seasonal allergic rhinitis: J30.2

## 2012-05-12 SURGERY — CYSTOSCOPY
Anesthesia: General

## 2012-05-12 NOTE — Interval H&P Note (Signed)
History and Physical Interval Note:  05/12/2012 9:57 AM  Mary Acosta  has presented today for surgery, with the diagnosis of Vaginal Pain  The various methods of treatment have been discussed with the patient and family. After consideration of risks, benefits and other options for treatment, the patient has consented to  Procedure(s) (LRB) with comments: CYSTOSCOPY (N/A) as a surgical intervention .  The patient's history has been reviewed, patient examined, no change in status, stable for surgery.  I have reviewed the patient's chart and labs.  Questions were answered to the patient's satisfaction.     Arshan Jabs A

## 2012-05-12 NOTE — Anesthesia Preprocedure Evaluation (Addendum)
Anesthesia Evaluation  Patient identified by MRN, date of birth, ID band Patient awake    Reviewed: Allergy & Precautions, H&P , NPO status , Patient's Chart, lab work & pertinent test results  Airway Mallampati: II TM Distance: >3 FB Neck ROM: Full    Dental  (+) Dental Advisory Given Crowns and temporary crown upper right lateral incisor.:   Pulmonary asthma , sleep apnea , COPD COPD inhaler, former smoker,  breath sounds clear to auscultation  Pulmonary exam normal       Cardiovascular hypertension, Pt. on medications Rhythm:Regular Rate:Normal  Cardiology note of 01/03/11 reviewed.  ECG: sinus brady o/w normal.   Neuro/Psych PSYCHIATRIC DISORDERS Anxiety Depression S/P TIA on February 2012. Quit smoking that day. Has an apparent facial deficit as left side of mouth elevates when she is talking. However, her smile is symmetric, as is puffing out her cheeks and tongue protrusion is midline.  Chronic back and knee pain. On fentanyl patch. TIA Neuromuscular disease    GI/Hepatic Neg liver ROS, GERD-  ,  Endo/Other  diabetes, Type 2, Insulin Dependent  Renal/GU negative Renal ROS  negative genitourinary   Musculoskeletal negative musculoskeletal ROS (+)   Abdominal (+) + obese,   Peds negative pediatric ROS (+)  Hematology negative hematology ROS (+)   Anesthesia Other Findings   Reproductive/Obstetrics negative OB ROS                       Anesthesia Physical Anesthesia Plan  ASA: III  Anesthesia Plan: General   Post-op Pain Management:    Induction: Intravenous  Airway Management Planned: LMA  Additional Equipment:   Intra-op Plan:   Post-operative Plan: Extubation in OR  Informed Consent: I have reviewed the patients History and Physical, chart, labs and discussed the procedure including the risks, benefits and alternatives for the proposed anesthesia with the patient or authorized  representative who has indicated his/her understanding and acceptance.   Dental advisory given  Plan Discussed with: CRNA  Anesthesia Plan Comments: (Pt cancelled for violation of NPO. She was eating candy upon arrival to day surgery. Increased risk of aspiration explained extensively. Patient upset but accepts rescheduling.)       Anesthesia Quick Evaluation

## 2012-05-13 ENCOUNTER — Encounter (HOSPITAL_BASED_OUTPATIENT_CLINIC_OR_DEPARTMENT_OTHER): Payer: Self-pay | Admitting: *Deleted

## 2012-05-13 NOTE — Progress Notes (Signed)
THIS WAS SCHEDULED 05-12-2012 AND CANCELLED BY DR CARIGNIN DUE TO PT EATING CANDY. RESCHEDULED 05-18-2012. NO CHANGES IN HX FROM PREVIOUS ASSESSMENT. NEEDS ISTAT AND EKG. ARRIVES 1045. WILL TAKE NUCYNTA AND SYNTHROID AND BRING INHALER, W/ SIP OF WATER.

## 2012-05-14 DIAGNOSIS — M545 Low back pain, unspecified: Secondary | ICD-10-CM | POA: Diagnosis not present

## 2012-05-14 DIAGNOSIS — M47817 Spondylosis without myelopathy or radiculopathy, lumbosacral region: Secondary | ICD-10-CM | POA: Diagnosis not present

## 2012-05-18 ENCOUNTER — Encounter (HOSPITAL_BASED_OUTPATIENT_CLINIC_OR_DEPARTMENT_OTHER): Payer: Self-pay | Admitting: Anesthesiology

## 2012-05-18 ENCOUNTER — Ambulatory Visit (HOSPITAL_BASED_OUTPATIENT_CLINIC_OR_DEPARTMENT_OTHER)
Admission: RE | Admit: 2012-05-18 | Discharge: 2012-05-18 | Disposition: A | Discharge status: Home / Self Care | Payer: Medicare Other | Source: Ambulatory Visit | Attending: Urology | Admitting: Urology

## 2012-05-18 ENCOUNTER — Other Ambulatory Visit: Payer: Self-pay

## 2012-05-18 ENCOUNTER — Telehealth (INDEPENDENT_AMBULATORY_CARE_PROVIDER_SITE_OTHER): Payer: Self-pay | Admitting: Surgery

## 2012-05-18 ENCOUNTER — Encounter (HOSPITAL_BASED_OUTPATIENT_CLINIC_OR_DEPARTMENT_OTHER)
Admission: RE | Disposition: A | Discharge status: Home / Self Care | Payer: Self-pay | Source: Ambulatory Visit | Attending: Urology

## 2012-05-18 ENCOUNTER — Encounter (HOSPITAL_BASED_OUTPATIENT_CLINIC_OR_DEPARTMENT_OTHER): Payer: Self-pay | Admitting: *Deleted

## 2012-05-18 ENCOUNTER — Ambulatory Visit (HOSPITAL_BASED_OUTPATIENT_CLINIC_OR_DEPARTMENT_OTHER): Payer: Medicare Other | Admitting: Anesthesiology

## 2012-05-18 DIAGNOSIS — N949 Unspecified condition associated with female genital organs and menstrual cycle: Secondary | ICD-10-CM | POA: Diagnosis not present

## 2012-05-18 DIAGNOSIS — N9489 Other specified conditions associated with female genital organs and menstrual cycle: Secondary | ICD-10-CM | POA: Diagnosis not present

## 2012-05-18 DIAGNOSIS — IMO0002 Reserved for concepts with insufficient information to code with codable children: Secondary | ICD-10-CM | POA: Insufficient documentation

## 2012-05-18 DIAGNOSIS — N390 Urinary tract infection, site not specified: Secondary | ICD-10-CM | POA: Diagnosis not present

## 2012-05-18 DIAGNOSIS — Z79899 Other long term (current) drug therapy: Secondary | ICD-10-CM | POA: Diagnosis not present

## 2012-05-18 DIAGNOSIS — E119 Type 2 diabetes mellitus without complications: Secondary | ICD-10-CM | POA: Diagnosis not present

## 2012-05-18 DIAGNOSIS — N3946 Mixed incontinence: Secondary | ICD-10-CM | POA: Diagnosis not present

## 2012-05-18 DIAGNOSIS — N942 Vaginismus: Secondary | ICD-10-CM | POA: Diagnosis not present

## 2012-05-18 DIAGNOSIS — N8111 Cystocele, midline: Secondary | ICD-10-CM | POA: Diagnosis not present

## 2012-05-18 HISTORY — PX: CYSTOSCOPY: SHX5120

## 2012-05-18 LAB — POCT I-STAT, CHEM 8
BUN: 15 mg/dL (ref 6–23)
Calcium, Ion: 0.95 mmol/L — ABNORMAL LOW (ref 1.12–1.23)
HCT: 38 % (ref 36.0–46.0)
Sodium: 137 mEq/L (ref 135–145)
TCO2: 25 mmol/L (ref 0–100)

## 2012-05-18 SURGERY — CYSTOSCOPY
Anesthesia: General | Site: Bladder | Wound class: Clean Contaminated

## 2012-05-18 MED ORDER — MIDAZOLAM HCL 5 MG/5ML IJ SOLN
INTRAMUSCULAR | Status: DC | PRN
  Administered 2012-05-18: 1 mg via INTRAVENOUS

## 2012-05-18 MED ORDER — STERILE WATER FOR IRRIGATION IR SOLN
Status: DC | PRN
  Administered 2012-05-18: 3000 mL

## 2012-05-18 MED ORDER — ONDANSETRON HCL 4 MG/2ML IJ SOLN
INTRAMUSCULAR | Status: DC | PRN
  Administered 2012-05-18: 4 mg via INTRAVENOUS

## 2012-05-18 MED ORDER — FENTANYL CITRATE 0.05 MG/ML IJ SOLN: 25.0000 ug | INTRAMUSCULAR | Status: DC | PRN

## 2012-05-18 MED ORDER — LIDOCAINE HCL (CARDIAC) 20 MG/ML IV SOLN
INTRAVENOUS | Status: DC | PRN
  Administered 2012-05-18: 80 mg via INTRAVENOUS

## 2012-05-18 MED ORDER — CIPROFLOXACIN IN D5W 400 MG/200ML IV SOLN
400.0000 mg | INTRAVENOUS | Status: AC
  Administered 2012-05-18: 400 mg via INTRAVENOUS

## 2012-05-18 MED ORDER — PROMETHAZINE HCL 25 MG/ML IJ SOLN: 6.2500 mg | INTRAMUSCULAR | Status: DC | PRN

## 2012-05-18 MED ORDER — LACTATED RINGERS IV SOLN
INTRAVENOUS | Status: DC | PRN
  Administered 2012-05-18: 12:00:00
  Administered 2012-05-18: 12:00:00 via INTRAVENOUS

## 2012-05-18 MED ORDER — PROPOFOL 10 MG/ML IV BOLUS
INTRAVENOUS | Status: DC | PRN
  Administered 2012-05-18: 200 mg via INTRAVENOUS
  Administered 2012-05-18: 100 mg via INTRAVENOUS

## 2012-05-18 SURGICAL SUPPLY — 25 items
ADAPTER CATH URET PLST 4-6FR (CATHETERS) IMPLANT
ADPR CATH URET STRL DISP 4-6FR (CATHETERS)
BAG DRAIN URO-CYSTO SKYTR STRL (DRAIN) ×2 IMPLANT
BAG DRN UROCATH (DRAIN) ×1
BALLN NEPHROSTOMY (BALLOONS)
BALLOON NEPHROSTOMY (BALLOONS) IMPLANT
CANISTER SUCT LVC 12 LTR MEDI- (MISCELLANEOUS) ×1 IMPLANT
CATH ROBINSON RED A/P 14FR (CATHETERS) ×2 IMPLANT
CATH URET 5FR 28IN CONE TIP (BALLOONS)
CATH URET 5FR 70CM CONE TIP (BALLOONS) IMPLANT
CLOTH BEACON ORANGE TIMEOUT ST (SAFETY) ×2 IMPLANT
DRAPE CAMERA CLOSED 9X96 (DRAPES) ×2 IMPLANT
GLOVE BIO SURGEON STRL SZ 6.5 (GLOVE) ×1 IMPLANT
GLOVE BIO SURGEON STRL SZ7.5 (GLOVE) ×2 IMPLANT
GLOVE ECLIPSE 6.0 STRL STRAW (GLOVE) ×1 IMPLANT
GOWN PREVENTION PLUS LG XLONG (DISPOSABLE) ×2 IMPLANT
GOWN STRL REIN XL XLG (GOWN DISPOSABLE) ×2 IMPLANT
GUIDEWIRE STR DUAL SENSOR (WIRE) ×2 IMPLANT
NDL SAFETY ECLIPSE 18X1.5 (NEEDLE) IMPLANT
NEEDLE HYPO 18GX1.5 SHARP (NEEDLE)
NS IRRIG 500ML POUR BTL (IV SOLUTION) IMPLANT
PACK CYSTOSCOPY (CUSTOM PROCEDURE TRAY) ×2 IMPLANT
SYR 20CC LL (SYRINGE) IMPLANT
SYRINGE IRR TOOMEY STRL 70CC (SYRINGE) IMPLANT
WATER STERILE IRR 3000ML UROMA (IV SOLUTION) ×2 IMPLANT

## 2012-05-18 NOTE — H&P (Signed)
History of Present Illness   [name] thought she had a urinary tract infection a few days ago with pain with urination and increased blood sugar. The symptoms settled down. They were moderate in severity and she really thought she had an infection. She is having surgery soon so I wanted her to come in.  Review of systems: No change in bowel or neurologic status. There was no other aggravating or relieving factors. The symptoms were mild to moderate in severity and came on fairly suddenly.  Review of systems: No change in bowel or neurologic status.   Urinalysis: I reviewed, ____    Past Medical History Problems  1. History of  Diabetes Mellitus 250.00 2. Former Smoker V15.82  Surgical History Problems  1. History of  Dilation And Curettage 2. History of  Laser Ablation Of Cervix  Current Meds 1. ClonazePAM 1 MG Oral Tablet; Therapy: 23Jan2013 to 2. Cymbalta 60 MG Oral Capsule Delayed Release Particles; Therapy: 19Dec2012 to 3. Enablex 7.5 MG Oral Tablet Extended Release 24 Hour; TAKE 7.5 MG Daily; Therapy: 11Jul2012  to (Evaluate:24Jul2014)  Requested for: 30Jul2013; Last Rx:29Jul2013 4. EpiPen 2-Pak 0.3 MG/0.3ML Injection Device; Therapy: 23Feb2012 to 5. FentaNYL 75 MCG/HR Transdermal Patch 72 Hour; Therapy: 07Sep2011 to 6. Flector 1.3 % Transdermal Patch; Therapy: 01Nov2011 to 7. HydrOXYzine HCl 10 MG Oral Tablet; Therapy: 18Mar2013 to 8. Klor-Con 8 MEQ Oral Tablet Extended Release; Therapy: 28Jun2012 to 9. Lidoderm 5 % External Patch; Therapy: 23Feb2012 to 10. Lisinopril 20 MG Oral Tablet; Therapy: 21Jan2013 to 11. Meloxicam 7.5 MG Oral Tablet; Therapy: 19Apr2013 to 12. Nucynta 75 MG Oral Tablet; Therapy: 26Jan2012 to 13. ProAir HFA 108 (90 Base) MCG/ACT Inhalation Aerosol Solution; Therapy: 15Dec2011 to 14. Synthroid 112 MCG Oral Tablet; Therapy: 04Jun2013 to 15. TiZANidine HCl 2 MG Oral Tablet; Therapy: 22Mar2013 to 16. Triamcinolone Acetonide 0.025 % External  Cream; Therapy: 21Jan2013 to 17. Voltaren 1 % Transdermal Gel; Therapy: 27Aug2011 to 18. Zolpidem Tartrate 5 MG Oral Tablet; Therapy: 19Jan2012 to 19. Zolpidem Tartrate ER 12.5 MG Oral Tablet Extended Release; Therapy: 20Mar2012 to  Allergies Medication  1. Sulfa Drugs  Social History Problems  1. Activities Of Daily Living 2. Exercise Habits Patient is not very active at this time I do with any active exercise or chores. She feels that she is not interested in any activity around the home and has been quite concerned and depressed about her issues. 3. Living Independently With Spouse 4. Self-reliant In Usual Daily Activities  Vitals Vital Signs [Data Includes: Last 1 Day]  29Aug2013 01:24PM  Blood Pressure: 134 / 78 Temperature: 98.9 F Heart Rate: 76  Results/Data Urine [Data Includes: Last 1 Day]   29Aug2013  COLOR YELLOW   APPEARANCE CLEAR   SPECIFIC GRAVITY >1.030   pH 5.5   GLUCOSE NEG mg/dL  BILIRUBIN SMALL   KETONE NEG mg/dL  BLOOD NEG   PROTEIN NEG mg/dL  UROBILINOGEN 1 mg/dL  NITRITE NEG   LEUKOCYTE ESTERASE NEG    Assessment Assessed  1. Dyspareunia 625.0 2. Urinary Tract Infection 599.0  Plan Health Maintenance (V70.0)  1. UA With REFLEX  Done: 29Aug2013 01:09PM  Discussion/Summary   She has had positive cultures in the past. Clinically she is not infected today and I will call her in 2 days if it is positive. I believe her cystoscopy under anesthesia is on the 17th.  After a thorough review of the management options for the patient's condition the patient  elected to proceed with surgical therapy   as noted above. We have discussed the potential benefits and risks of the procedure, side effects of the proposed treatment, the likelihood of the patient achieving the goals of the procedure, and any potential problems that might occur during the procedure or recuperation. Informed consent has been obtained.  

## 2012-05-18 NOTE — Progress Notes (Signed)
Glasses returned to pt 

## 2012-05-18 NOTE — Interval H&P Note (Signed)
History and Physical Interval Note:  05/18/2012 7:45 AM  Mary Acosta  has presented today for surgery, with the diagnosis of Vaginal Pain  The various methods of treatment have been discussed with the patient and family. After consideration of risks, benefits and other options for treatment, the patient has consented to  Procedure(s) (LRB) with comments: CYSTOSCOPY (N/A) as a surgical intervention .  The patient's history has been reviewed, patient examined, no change in status, stable for surgery.  I have reviewed the patient's chart and labs.  Questions were answered to the patient's satisfaction.     Ardelle Haliburton A

## 2012-05-18 NOTE — Transfer of Care (Signed)
Immediate Anesthesia Transfer of Care Note  Patient: Mary Acosta  Procedure(s) Performed: Procedure(s) (LRB) with comments: CYSTOSCOPY (N/A) - examination under anethesia  Patient Location: PACU  Anesthesia Type: General  Level of Consciousness: awake, alert  and oriented  Airway & Oxygen Therapy: Patient Spontanous Breathing and Patient connected to nasal cannula oxygen  Post-op Assessment: Report given to PACU RN  Post vital signs: Reviewed and stable  Complications: No apparent anesthesia complications

## 2012-05-18 NOTE — Anesthesia Procedure Notes (Addendum)
Procedure Name: LMA Insertion Date/Time: 05/18/2012 12:12 PM Performed by: Maris Berger T Pre-anesthesia Checklist: Patient identified, Emergency Drugs available, Suction available and Patient being monitored Patient Re-evaluated:Patient Re-evaluated prior to inductionOxygen Delivery Method: Circle System Utilized Preoxygenation: Pre-oxygenation with 100% oxygen Intubation Type: IV induction Ventilation: Mask ventilation without difficulty LMA: LMA inserted LMA Size: 4.0 Number of attempts: 1 Airway Equipment and Method: bite block Placement Confirmation: positive ETCO2 Dental Injury: Teeth and Oropharynx as per pre-operative assessment

## 2012-05-18 NOTE — Op Note (Signed)
Preoperative diagnosis: Pelvic pain post sling Postoperative diagnosis: Pelvic pain post sling Surgery: Examination under anesthesia and cystoscopy Surgeon: Dr. Lorin Picket Tabrina Esty  The patient has the above diagnoses and consented the above procedure. Preoperative antibiotics were given. She is on Enablex for mild voiding dysfunction  She continues to have dyspareunia and pain possibly more on her left side.  I didn't extensive examination under anesthesia with the weighted speculum as well as a duck bill speculum I had very good lighting in her in the lithotomy position. I cannot feel or see any extrusion knee the urethrovesical angle throughout the vaginal wall oh underneath the urethra. I could not palpate any sent skin band or foreign body. She no vaginitis. She had a moderate to large grade 2 cystocele with central defect with reasonable cuff support  I cystoscoped the patient. Bladder mucosa and trigone were normal. There is no foreign body in the urethra or bladder. There was no evidence of cystitis  Bladder was emptied and patient was taken to recover room

## 2012-05-18 NOTE — Telephone Encounter (Signed)
I called the patient per Dr Ermalene Searing request concerning lap band surgery with vagotomy. I left a voicemail asking the patient to return my call @ [phone].Mary KitchenMarland Kitchencef

## 2012-05-18 NOTE — Anesthesia Postprocedure Evaluation (Signed)
  Anesthesia Post-op Note  Patient: Mary Acosta  Procedure(s) Performed: Procedure(s) (LRB): CYSTOSCOPY (N/A)  Patient Location: PACU  Anesthesia Type: General  Level of Consciousness: awake and alert   Airway and Oxygen Therapy: Patient Spontanous Breathing  Post-op Pain: mild  Post-op Assessment: Post-op Vital signs reviewed, Patient's Cardiovascular Status Stable, Respiratory Function Stable, Patent Airway and No signs of Nausea or vomiting  Post-op Vital Signs: stable  Complications: No apparent anesthesia complications

## 2012-05-19 ENCOUNTER — Encounter (HOSPITAL_BASED_OUTPATIENT_CLINIC_OR_DEPARTMENT_OTHER): Payer: Self-pay | Admitting: Urology

## 2012-06-03 ENCOUNTER — Other Ambulatory Visit: Payer: Self-pay | Admitting: Obstetrics & Gynecology

## 2012-06-03 DIAGNOSIS — Z1151 Encounter for screening for human papillomavirus (HPV): Secondary | ICD-10-CM | POA: Diagnosis not present

## 2012-06-03 DIAGNOSIS — N6320 Unspecified lump in the left breast, unspecified quadrant: Secondary | ICD-10-CM

## 2012-06-03 DIAGNOSIS — N63 Unspecified lump in unspecified breast: Secondary | ICD-10-CM | POA: Diagnosis not present

## 2012-06-03 DIAGNOSIS — B977 Papillomavirus as the cause of diseases classified elsewhere: Secondary | ICD-10-CM | POA: Diagnosis not present

## 2012-06-03 DIAGNOSIS — N949 Unspecified condition associated with female genital organs and menstrual cycle: Secondary | ICD-10-CM | POA: Diagnosis not present

## 2012-06-03 DIAGNOSIS — Z8742 Personal history of other diseases of the female genital tract: Secondary | ICD-10-CM | POA: Diagnosis not present

## 2012-06-03 DIAGNOSIS — Z01419 Encounter for gynecological examination (general) (routine) without abnormal findings: Secondary | ICD-10-CM | POA: Diagnosis not present

## 2012-06-03 DIAGNOSIS — Z124 Encounter for screening for malignant neoplasm of cervix: Secondary | ICD-10-CM | POA: Diagnosis not present

## 2012-06-03 DIAGNOSIS — Z9189 Other specified personal risk factors, not elsewhere classified: Secondary | ICD-10-CM | POA: Diagnosis not present

## 2012-06-08 ENCOUNTER — Other Ambulatory Visit: Payer: Medicare Other

## 2012-06-09 DIAGNOSIS — N949 Unspecified condition associated with female genital organs and menstrual cycle: Secondary | ICD-10-CM | POA: Diagnosis not present

## 2012-06-11 ENCOUNTER — Ambulatory Visit
Admission: RE | Admit: 2012-06-11 | Discharge: 2012-06-11 | Disposition: A | Discharge status: Home / Self Care | Payer: Medicare Other | Source: Ambulatory Visit | Attending: Obstetrics & Gynecology | Admitting: Obstetrics & Gynecology

## 2012-06-11 DIAGNOSIS — N6459 Other signs and symptoms in breast: Secondary | ICD-10-CM | POA: Diagnosis not present

## 2012-06-11 DIAGNOSIS — N6320 Unspecified lump in the left breast, unspecified quadrant: Secondary | ICD-10-CM

## 2012-06-16 ENCOUNTER — Encounter: Payer: Self-pay | Admitting: Gastroenterology

## 2012-06-25 DIAGNOSIS — M545 Low back pain, unspecified: Secondary | ICD-10-CM | POA: Diagnosis not present

## 2012-06-25 DIAGNOSIS — M47817 Spondylosis without myelopathy or radiculopathy, lumbosacral region: Secondary | ICD-10-CM | POA: Diagnosis not present

## 2012-06-26 DIAGNOSIS — Z23 Encounter for immunization: Secondary | ICD-10-CM | POA: Diagnosis not present

## 2012-06-26 DIAGNOSIS — N3946 Mixed incontinence: Secondary | ICD-10-CM | POA: Diagnosis not present

## 2012-06-26 DIAGNOSIS — E119 Type 2 diabetes mellitus without complications: Secondary | ICD-10-CM | POA: Diagnosis not present

## 2012-06-26 DIAGNOSIS — E890 Postprocedural hypothyroidism: Secondary | ICD-10-CM | POA: Diagnosis not present

## 2012-06-26 DIAGNOSIS — C73 Malignant neoplasm of thyroid gland: Secondary | ICD-10-CM | POA: Diagnosis not present

## 2012-06-26 DIAGNOSIS — IMO0002 Reserved for concepts with insufficient information to code with codable children: Secondary | ICD-10-CM | POA: Diagnosis not present

## 2012-06-30 DIAGNOSIS — J45909 Unspecified asthma, uncomplicated: Secondary | ICD-10-CM | POA: Diagnosis not present

## 2012-06-30 DIAGNOSIS — E890 Postprocedural hypothyroidism: Secondary | ICD-10-CM | POA: Diagnosis not present

## 2012-06-30 DIAGNOSIS — E119 Type 2 diabetes mellitus without complications: Secondary | ICD-10-CM | POA: Diagnosis not present

## 2012-06-30 DIAGNOSIS — F411 Generalized anxiety disorder: Secondary | ICD-10-CM | POA: Diagnosis not present

## 2012-07-30 DIAGNOSIS — M47817 Spondylosis without myelopathy or radiculopathy, lumbosacral region: Secondary | ICD-10-CM | POA: Diagnosis not present

## 2012-07-30 DIAGNOSIS — Z79899 Other long term (current) drug therapy: Secondary | ICD-10-CM | POA: Diagnosis not present

## 2012-08-23 DIAGNOSIS — J069 Acute upper respiratory infection, unspecified: Secondary | ICD-10-CM | POA: Diagnosis not present

## 2012-08-23 DIAGNOSIS — R5381 Other malaise: Secondary | ICD-10-CM | POA: Diagnosis not present

## 2012-09-22 DIAGNOSIS — J40 Bronchitis, not specified as acute or chronic: Secondary | ICD-10-CM | POA: Diagnosis not present

## 2012-09-22 DIAGNOSIS — J45901 Unspecified asthma with (acute) exacerbation: Secondary | ICD-10-CM | POA: Diagnosis not present

## 2012-09-22 DIAGNOSIS — R509 Fever, unspecified: Secondary | ICD-10-CM | POA: Diagnosis not present

## 2012-09-23 DIAGNOSIS — R509 Fever, unspecified: Secondary | ICD-10-CM | POA: Diagnosis not present

## 2012-09-29 DIAGNOSIS — N3946 Mixed incontinence: Secondary | ICD-10-CM | POA: Diagnosis not present

## 2012-09-29 DIAGNOSIS — IMO0002 Reserved for concepts with insufficient information to code with codable children: Secondary | ICD-10-CM | POA: Diagnosis not present

## 2012-10-22 DIAGNOSIS — M545 Low back pain, unspecified: Secondary | ICD-10-CM | POA: Diagnosis not present

## 2012-10-22 DIAGNOSIS — M533 Sacrococcygeal disorders, not elsewhere classified: Secondary | ICD-10-CM | POA: Diagnosis not present

## 2012-10-22 DIAGNOSIS — Z79899 Other long term (current) drug therapy: Secondary | ICD-10-CM | POA: Diagnosis not present

## 2012-10-22 DIAGNOSIS — IMO0001 Reserved for inherently not codable concepts without codable children: Secondary | ICD-10-CM | POA: Diagnosis not present

## 2012-10-22 DIAGNOSIS — M47817 Spondylosis without myelopathy or radiculopathy, lumbosacral region: Secondary | ICD-10-CM | POA: Diagnosis not present

## 2012-10-29 DIAGNOSIS — R062 Wheezing: Secondary | ICD-10-CM | POA: Diagnosis not present

## 2012-10-29 DIAGNOSIS — J441 Chronic obstructive pulmonary disease with (acute) exacerbation: Secondary | ICD-10-CM | POA: Diagnosis not present

## 2012-11-04 ENCOUNTER — Encounter (HOSPITAL_COMMUNITY): Payer: Self-pay | Admitting: *Deleted

## 2012-11-04 ENCOUNTER — Emergency Department (HOSPITAL_COMMUNITY): Payer: Medicare Other

## 2012-11-04 ENCOUNTER — Emergency Department (HOSPITAL_COMMUNITY)
Admission: EM | Admit: 2012-11-04 | Discharge: 2012-11-04 | Disposition: A | Discharge status: Home / Self Care | Payer: Medicare Other | Attending: Emergency Medicine | Admitting: Emergency Medicine

## 2012-11-04 DIAGNOSIS — IMO0002 Reserved for concepts with insufficient information to code with codable children: Secondary | ICD-10-CM | POA: Diagnosis not present

## 2012-11-04 DIAGNOSIS — I10 Essential (primary) hypertension: Secondary | ICD-10-CM | POA: Insufficient documentation

## 2012-11-04 DIAGNOSIS — F411 Generalized anxiety disorder: Secondary | ICD-10-CM | POA: Diagnosis not present

## 2012-11-04 DIAGNOSIS — R51 Headache: Secondary | ICD-10-CM | POA: Diagnosis not present

## 2012-11-04 DIAGNOSIS — Z96659 Presence of unspecified artificial knee joint: Secondary | ICD-10-CM | POA: Diagnosis not present

## 2012-11-04 DIAGNOSIS — S22000A Wedge compression fracture of unspecified thoracic vertebra, initial encounter for closed fracture: Secondary | ICD-10-CM

## 2012-11-04 DIAGNOSIS — S0083XA Contusion of other part of head, initial encounter: Secondary | ICD-10-CM | POA: Diagnosis not present

## 2012-11-04 DIAGNOSIS — Z79899 Other long term (current) drug therapy: Secondary | ICD-10-CM | POA: Diagnosis not present

## 2012-11-04 DIAGNOSIS — Z8673 Personal history of transient ischemic attack (TIA), and cerebral infarction without residual deficits: Secondary | ICD-10-CM | POA: Diagnosis not present

## 2012-11-04 DIAGNOSIS — G894 Chronic pain syndrome: Secondary | ICD-10-CM | POA: Diagnosis not present

## 2012-11-04 DIAGNOSIS — Z794 Long term (current) use of insulin: Secondary | ICD-10-CM | POA: Insufficient documentation

## 2012-11-04 DIAGNOSIS — S8000XA Contusion of unspecified knee, initial encounter: Secondary | ICD-10-CM | POA: Diagnosis not present

## 2012-11-04 DIAGNOSIS — F3289 Other specified depressive episodes: Secondary | ICD-10-CM | POA: Diagnosis not present

## 2012-11-04 DIAGNOSIS — Z9889 Other specified postprocedural states: Secondary | ICD-10-CM | POA: Insufficient documentation

## 2012-11-04 DIAGNOSIS — S8990XA Unspecified injury of unspecified lower leg, initial encounter: Secondary | ICD-10-CM | POA: Diagnosis not present

## 2012-11-04 DIAGNOSIS — J4489 Other specified chronic obstructive pulmonary disease: Secondary | ICD-10-CM | POA: Insufficient documentation

## 2012-11-04 DIAGNOSIS — M7989 Other specified soft tissue disorders: Secondary | ICD-10-CM | POA: Diagnosis not present

## 2012-11-04 DIAGNOSIS — Z87891 Personal history of nicotine dependence: Secondary | ICD-10-CM | POA: Diagnosis not present

## 2012-11-04 DIAGNOSIS — Z87448 Personal history of other diseases of urinary system: Secondary | ICD-10-CM | POA: Insufficient documentation

## 2012-11-04 DIAGNOSIS — G4700 Insomnia, unspecified: Secondary | ICD-10-CM | POA: Insufficient documentation

## 2012-11-04 DIAGNOSIS — S0990XA Unspecified injury of head, initial encounter: Secondary | ICD-10-CM | POA: Diagnosis not present

## 2012-11-04 DIAGNOSIS — E119 Type 2 diabetes mellitus without complications: Secondary | ICD-10-CM | POA: Diagnosis not present

## 2012-11-04 DIAGNOSIS — M542 Cervicalgia: Secondary | ICD-10-CM | POA: Diagnosis not present

## 2012-11-04 DIAGNOSIS — E785 Hyperlipidemia, unspecified: Secondary | ICD-10-CM | POA: Insufficient documentation

## 2012-11-04 DIAGNOSIS — F329 Major depressive disorder, single episode, unspecified: Secondary | ICD-10-CM | POA: Diagnosis not present

## 2012-11-04 DIAGNOSIS — M549 Dorsalgia, unspecified: Secondary | ICD-10-CM | POA: Diagnosis not present

## 2012-11-04 DIAGNOSIS — M353 Polymyalgia rheumatica: Secondary | ICD-10-CM | POA: Insufficient documentation

## 2012-11-04 DIAGNOSIS — S298XXA Other specified injuries of thorax, initial encounter: Secondary | ICD-10-CM | POA: Diagnosis not present

## 2012-11-04 DIAGNOSIS — J449 Chronic obstructive pulmonary disease, unspecified: Secondary | ICD-10-CM | POA: Diagnosis not present

## 2012-11-04 DIAGNOSIS — Z8742 Personal history of other diseases of the female genital tract: Secondary | ICD-10-CM | POA: Insufficient documentation

## 2012-11-04 DIAGNOSIS — M4120 Other idiopathic scoliosis, site unspecified: Secondary | ICD-10-CM | POA: Insufficient documentation

## 2012-11-04 DIAGNOSIS — T7411XA Adult physical abuse, confirmed, initial encounter: Secondary | ICD-10-CM | POA: Diagnosis not present

## 2012-11-04 DIAGNOSIS — S0993XA Unspecified injury of face, initial encounter: Secondary | ICD-10-CM | POA: Diagnosis not present

## 2012-11-04 DIAGNOSIS — S0003XA Contusion of scalp, initial encounter: Secondary | ICD-10-CM | POA: Insufficient documentation

## 2012-11-04 DIAGNOSIS — G4733 Obstructive sleep apnea (adult) (pediatric): Secondary | ICD-10-CM | POA: Diagnosis not present

## 2012-11-04 DIAGNOSIS — S22009A Unspecified fracture of unspecified thoracic vertebra, initial encounter for closed fracture: Secondary | ICD-10-CM | POA: Insufficient documentation

## 2012-11-04 DIAGNOSIS — Z8719 Personal history of other diseases of the digestive system: Secondary | ICD-10-CM | POA: Diagnosis not present

## 2012-11-04 DIAGNOSIS — Z8739 Personal history of other diseases of the musculoskeletal system and connective tissue: Secondary | ICD-10-CM | POA: Insufficient documentation

## 2012-11-04 DIAGNOSIS — S1093XA Contusion of unspecified part of neck, initial encounter: Secondary | ICD-10-CM | POA: Insufficient documentation

## 2012-11-04 MED ORDER — HYDROMORPHONE HCL PF 1 MG/ML IJ SOLN
1.0000 mg | Freq: Once | INTRAMUSCULAR | Status: AC
  Administered 2012-11-04: 1 mg via INTRAVENOUS
  Filled 2012-11-04: qty 1

## 2012-11-04 MED ORDER — HYDROMORPHONE HCL 4 MG PO TABS: 4.0000 mg | ORAL_TABLET | ORAL | Status: DC | PRN
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
## 2012-12-17 ENCOUNTER — Other Ambulatory Visit (HOSPITAL_COMMUNITY): Payer: Self-pay | Admitting: Internal Medicine

## 2012-12-17 ENCOUNTER — Ambulatory Visit (HOSPITAL_COMMUNITY)
Admission: RE | Admit: 2012-12-17 | Discharge: 2012-12-17 | Disposition: A | Discharge status: Home / Self Care | Payer: Medicare Other | Source: Ambulatory Visit | Attending: Internal Medicine | Admitting: Internal Medicine

## 2012-12-17 DIAGNOSIS — E876 Hypokalemia: Secondary | ICD-10-CM | POA: Diagnosis not present

## 2012-12-17 DIAGNOSIS — R609 Edema, unspecified: Secondary | ICD-10-CM | POA: Insufficient documentation

## 2012-12-17 DIAGNOSIS — M79609 Pain in unspecified limb: Secondary | ICD-10-CM | POA: Insufficient documentation

## 2012-12-17 DIAGNOSIS — F411 Generalized anxiety disorder: Secondary | ICD-10-CM | POA: Diagnosis not present

## 2012-12-17 DIAGNOSIS — J309 Allergic rhinitis, unspecified: Secondary | ICD-10-CM | POA: Diagnosis not present

## 2012-12-31 DIAGNOSIS — Z79899 Other long term (current) drug therapy: Secondary | ICD-10-CM | POA: Diagnosis not present

## 2012-12-31 DIAGNOSIS — M5137 Other intervertebral disc degeneration, lumbosacral region: Secondary | ICD-10-CM | POA: Diagnosis not present

## 2013-03-29 DIAGNOSIS — C73 Malignant neoplasm of thyroid gland: Secondary | ICD-10-CM | POA: Diagnosis not present

## 2013-04-06 DIAGNOSIS — E890 Postprocedural hypothyroidism: Secondary | ICD-10-CM | POA: Diagnosis not present

## 2013-04-06 DIAGNOSIS — C73 Malignant neoplasm of thyroid gland: Secondary | ICD-10-CM | POA: Diagnosis not present

## 2013-04-06 DIAGNOSIS — G609 Hereditary and idiopathic neuropathy, unspecified: Secondary | ICD-10-CM | POA: Diagnosis not present

## 2013-04-06 DIAGNOSIS — E119 Type 2 diabetes mellitus without complications: Secondary | ICD-10-CM | POA: Diagnosis not present

## 2013-04-19 DIAGNOSIS — Z79899 Other long term (current) drug therapy: Secondary | ICD-10-CM | POA: Diagnosis not present

## 2013-04-19 DIAGNOSIS — M5137 Other intervertebral disc degeneration, lumbosacral region: Secondary | ICD-10-CM | POA: Diagnosis not present

## 2013-05-08 DIAGNOSIS — Z23 Encounter for immunization: Secondary | ICD-10-CM | POA: Diagnosis not present

## 2013-05-11 ENCOUNTER — Emergency Department (INDEPENDENT_AMBULATORY_CARE_PROVIDER_SITE_OTHER)
Admission: EM | Admit: 2013-05-11 | Discharge: 2013-05-11 | Disposition: A | Discharge status: Home / Self Care | Payer: Medicare Other | Source: Home / Self Care | Attending: Emergency Medicine | Admitting: Emergency Medicine

## 2013-05-11 ENCOUNTER — Encounter (HOSPITAL_COMMUNITY): Payer: Self-pay | Admitting: *Deleted

## 2013-05-11 DIAGNOSIS — E119 Type 2 diabetes mellitus without complications: Secondary | ICD-10-CM | POA: Diagnosis not present

## 2013-05-11 LAB — POCT I-STAT, CHEM 8
BUN: 15 mg/dL (ref 6–23)
Calcium, Ion: 1.18 mmol/L (ref 1.12–1.23)
Creatinine, Ser: 1 mg/dL (ref 0.50–1.10)
Sodium: 140 mEq/L (ref 135–145)
TCO2: 28 mmol/L (ref 0–100)

## 2013-05-11 MED ORDER — INSULIN LISPRO 100 UNIT/ML (KWIKPEN): 10.0000 [IU] | PEN_INJECTOR | Freq: Three times a day (TID) | SUBCUTANEOUS | Status: DC

## 2013-05-11 NOTE — ED Notes (Signed)
Pt  Reports  Symptoms  Of  Dry  Mouth        And  Elevated  Blood  Sugar               Pt  denys  Any  Nausea  Vomiting  Or  Any  Diarrhea        Pt  States  She  Was  Told    By  Her  pcp    She   Needed  To be  Seen      -  Pt  styates  She  Did  Not  Have  The  Finances  Therefore  She  Came  Here      She  Is  Awake  And  Alert  And  Oriented  And  Is  Sitting  Upright on  Exam table  And  Is  In no  Acute  Distress

## 2013-05-11 NOTE — ED Provider Notes (Signed)
Chief Complaint:   Chief Complaint  Patient presents with  . Blood Sugar Problem    History of Present Illness:   Mary Acosta is a 57 year old female who has had diabetes for the past 14 years. Currently she is on Lantus insulin, but seems to adjust the dose herself going from 40 units to 70 units depending upon how she feels. She doesn't feel like her diabetes is under good control. Her CBGs usually run from about 250-400. This morning if was 280. She took her brother' s NovoLog before a meal yesterday and her blood sugar came down right away. She would like to have her prescription for NovoLog to use before meals. She describes dry mouth, blurred vision, polyuria, and polydipsia. She has no abdominal pain, nausea, or vomiting. No hypoglycemic episodes.  Review of Systems:  Other than noted above, the patient denies any of the following symptoms. Systemic:  No fever, chills, fatigue, weight loss or gain. Eye:  No blurred vision or diplopia. Lungs:  No cough, wheezing, or shortness of breath. Heart:  No chest pain, tightness, pressure, palpitation, dizziness, syncope, or edema. Abdomen:  No abdominal pain, nausea, vomiting or diarrrhea. GU:  No dysuria, frequency, urgency, hematuria. Ext:  No pain, paresthesias, swelling, or ulcerations. Endocrine:  No polyuria, polydipsia, heat or cold intolerance. Skin:  No rash or itching. Neuro:  No focal weakness or numbness.  PMFSH:  Past medical history, family history, social history, meds, and allergies were reviewed. She has no known allergies. She takes Synthroid and meloxicam in addition to her insulins. She has hypothyroidism and many orthopedic issues.  Physical Exam:   Vital signs:  BP 168/84  Pulse 90  Temp(Src) 98.6 F (37 C) (Oral)  Resp 16  SpO2 98% Gen:  Alert, oriented, in no distress. Eye:  PERRL, full EOM, lids conjunctivas, and sclera unremarkable. ENT:  TMs and canals normal.  Mucous membranes moist.  No acetone odor.   Pharynx clear.   Neck:  Supple, full ROM, no adenopathy or tenderness.  No JVD. Lungs:  Clear to auscultation.  No wheezes, rales or rhonchi. Heart:  Regular rhythm.  No gallops or murmers. Abdomen:  Soft, flat, non-distended, nontener.  No hepato-splenomegaly or mass.  Bowel sounds normal.  No pulsatile midline mass or bruit. Ext:  No edema, pulses full.  No ulceration or skin lesions. Skin:  Clear, warm and dry.  No rash or lesions. Neuro:  Alert and oriented times 3.  No focal weakness.  Speech normal.  CNs intact.  Labs:   Results for orders placed during the hospital encounter of 05/11/13  POCT I-STAT, CHEM 8      Result Value Range   Sodium 140  135 - 145 mEq/L   Potassium 4.0  3.5 - 5.1 mEq/L   Chloride 101  96 - 112 mEq/L   BUN 15  6 - 23 mg/dL   Creatinine, Ser 1.61  0.50 - 1.10 mg/dL   Glucose, Bld 096 (*) 70 - 99 mg/dL   Calcium, Ion 0.45  4.09 - 1.23 mmol/L   TCO2 28  0 - 100 mmol/L   Hemoglobin 14.6  12.0 - 15.0 g/dL   HCT 81.1  91.4 - 78.2 %    Assessment:  The encounter diagnosis was Type 2 diabetes mellitus.  Her glucose looks like it's in good control right now, but has not been controlled over the past several weeks to months. I asked her why she didn't see her endocrinologist about  this, and she could not give me a very good answer, suggesting that she does not see her endocrinologist or her primary care physician very often. For right now I would have her increase her Lantus to 50 units, increasing by 3 units per day if her fasting sugars aren't in the 90-130 range. I will let her try the NovoLog before meals, 10 units at a time. I urged her to followup with your endocrinologist or her primary care physician within the next week.  Plan:   1.  Meds:  The following meds were prescribed:   Discharge Medication List as of 05/11/2013  4:55 PM    START taking these medications   Details  insulin lispro (HUMALOG PEN) 100 UNIT/ML SOPN Inject 10 Units into the skin 3  (three) times daily with meals., Starting 05/11/2013, Until Discontinued, Normal        2.  Patient Education/Counseling:  The patient was given appropriate handouts, self care instructions, and instructed in symptomatic relief.  Discussed proper adjustment of her insulin dosage, and importance of diet and exercise.  3.  Follow up:  The patient was told to follow up if no better in 3 to 4 days, if becoming worse in any way, and given some red flag symptoms such as hypoglycemic symptoms, or blood sugars over 400 which would prompt immediate return.  Follow up with primary care physician or endocrinologist within the next one to 2 weeks.      Reuben Likes, MD 05/11/13 2121

## 2013-07-12 DIAGNOSIS — E119 Type 2 diabetes mellitus without complications: Secondary | ICD-10-CM | POA: Diagnosis not present

## 2013-07-12 DIAGNOSIS — H3530 Unspecified macular degeneration: Secondary | ICD-10-CM | POA: Diagnosis not present

## 2013-07-19 DIAGNOSIS — Z79899 Other long term (current) drug therapy: Secondary | ICD-10-CM | POA: Diagnosis not present

## 2013-07-19 DIAGNOSIS — Z5181 Encounter for therapeutic drug level monitoring: Secondary | ICD-10-CM | POA: Diagnosis not present

## 2013-09-14 DIAGNOSIS — F1190 Opioid use, unspecified, uncomplicated: Secondary | ICD-10-CM | POA: Insufficient documentation

## 2013-11-04 DIAGNOSIS — R413 Other amnesia: Secondary | ICD-10-CM | POA: Diagnosis not present

## 2013-11-04 DIAGNOSIS — M25569 Pain in unspecified knee: Secondary | ICD-10-CM | POA: Diagnosis not present

## 2013-11-04 DIAGNOSIS — M533 Sacrococcygeal disorders, not elsewhere classified: Secondary | ICD-10-CM | POA: Diagnosis not present

## 2013-11-04 DIAGNOSIS — M5137 Other intervertebral disc degeneration, lumbosacral region: Secondary | ICD-10-CM | POA: Diagnosis not present

## 2013-11-04 DIAGNOSIS — Z79899 Other long term (current) drug therapy: Secondary | ICD-10-CM | POA: Diagnosis not present

## 2014-01-20 DIAGNOSIS — E890 Postprocedural hypothyroidism: Secondary | ICD-10-CM | POA: Diagnosis not present

## 2014-01-20 DIAGNOSIS — E119 Type 2 diabetes mellitus without complications: Secondary | ICD-10-CM | POA: Diagnosis not present

## 2014-01-20 DIAGNOSIS — G609 Hereditary and idiopathic neuropathy, unspecified: Secondary | ICD-10-CM | POA: Diagnosis not present

## 2014-01-20 DIAGNOSIS — C73 Malignant neoplasm of thyroid gland: Secondary | ICD-10-CM | POA: Diagnosis not present

## 2014-01-31 DIAGNOSIS — Z79899 Other long term (current) drug therapy: Secondary | ICD-10-CM | POA: Diagnosis not present

## 2014-01-31 DIAGNOSIS — M5137 Other intervertebral disc degeneration, lumbosacral region: Secondary | ICD-10-CM | POA: Diagnosis not present

## 2014-01-31 DIAGNOSIS — M25569 Pain in unspecified knee: Secondary | ICD-10-CM | POA: Diagnosis not present

## 2014-01-31 DIAGNOSIS — M533 Sacrococcygeal disorders, not elsewhere classified: Secondary | ICD-10-CM | POA: Diagnosis not present

## 2014-03-23 ENCOUNTER — Encounter: Payer: Self-pay | Admitting: Gastroenterology

## 2014-04-07 DIAGNOSIS — E1149 Type 2 diabetes mellitus with other diabetic neurological complication: Secondary | ICD-10-CM | POA: Diagnosis not present

## 2014-04-07 DIAGNOSIS — E785 Hyperlipidemia, unspecified: Secondary | ICD-10-CM | POA: Diagnosis not present

## 2014-04-07 DIAGNOSIS — I10 Essential (primary) hypertension: Secondary | ICD-10-CM | POA: Diagnosis not present

## 2014-04-07 DIAGNOSIS — E1142 Type 2 diabetes mellitus with diabetic polyneuropathy: Secondary | ICD-10-CM | POA: Diagnosis not present

## 2014-05-06 DIAGNOSIS — Z79899 Other long term (current) drug therapy: Secondary | ICD-10-CM | POA: Diagnosis not present

## 2014-05-06 DIAGNOSIS — M533 Sacrococcygeal disorders, not elsewhere classified: Secondary | ICD-10-CM | POA: Diagnosis not present

## 2014-05-06 DIAGNOSIS — M5137 Other intervertebral disc degeneration, lumbosacral region: Secondary | ICD-10-CM | POA: Diagnosis not present

## 2014-05-06 DIAGNOSIS — M25569 Pain in unspecified knee: Secondary | ICD-10-CM | POA: Diagnosis not present

## 2014-05-18 DIAGNOSIS — Z0000 Encounter for general adult medical examination without abnormal findings: Secondary | ICD-10-CM | POA: Diagnosis not present

## 2014-05-18 DIAGNOSIS — Z23 Encounter for immunization: Secondary | ICD-10-CM | POA: Diagnosis not present

## 2014-05-18 DIAGNOSIS — E890 Postprocedural hypothyroidism: Secondary | ICD-10-CM | POA: Diagnosis not present

## 2014-05-18 DIAGNOSIS — E1149 Type 2 diabetes mellitus with other diabetic neurological complication: Secondary | ICD-10-CM | POA: Diagnosis not present

## 2014-05-18 DIAGNOSIS — E785 Hyperlipidemia, unspecified: Secondary | ICD-10-CM | POA: Diagnosis not present

## 2014-05-18 DIAGNOSIS — I10 Essential (primary) hypertension: Secondary | ICD-10-CM | POA: Diagnosis not present

## 2014-05-26 ENCOUNTER — Other Ambulatory Visit (HOSPITAL_COMMUNITY)
Admission: RE | Admit: 2014-05-26 | Discharge: 2014-05-26 | Disposition: A | Discharge status: Home / Self Care | Payer: Medicare Other | Source: Ambulatory Visit | Attending: Internal Medicine | Admitting: Internal Medicine

## 2014-05-26 ENCOUNTER — Other Ambulatory Visit: Payer: Self-pay | Admitting: Registered Nurse

## 2014-05-26 DIAGNOSIS — Z124 Encounter for screening for malignant neoplasm of cervix: Secondary | ICD-10-CM | POA: Insufficient documentation

## 2014-05-26 DIAGNOSIS — Z1212 Encounter for screening for malignant neoplasm of rectum: Secondary | ICD-10-CM | POA: Diagnosis not present

## 2014-05-26 DIAGNOSIS — Z01419 Encounter for gynecological examination (general) (routine) without abnormal findings: Secondary | ICD-10-CM | POA: Diagnosis not present

## 2014-05-26 DIAGNOSIS — M353 Polymyalgia rheumatica: Secondary | ICD-10-CM | POA: Diagnosis not present

## 2014-05-26 DIAGNOSIS — I10 Essential (primary) hypertension: Secondary | ICD-10-CM | POA: Diagnosis not present

## 2014-05-30 LAB — CYTOLOGY - PAP

## 2014-06-06 DIAGNOSIS — M353 Polymyalgia rheumatica: Secondary | ICD-10-CM | POA: Diagnosis not present

## 2014-06-06 DIAGNOSIS — I10 Essential (primary) hypertension: Secondary | ICD-10-CM | POA: Diagnosis not present

## 2014-07-09 ENCOUNTER — Encounter (HOSPITAL_COMMUNITY): Payer: Self-pay

## 2014-07-09 ENCOUNTER — Observation Stay (HOSPITAL_COMMUNITY)
Admission: EM | Admit: 2014-07-09 | Discharge: 2014-07-10 | Disposition: A | Discharge status: Home / Self Care | Payer: Medicare Other | Attending: Infectious Diseases | Admitting: Infectious Diseases

## 2014-07-09 DIAGNOSIS — N760 Acute vaginitis: Secondary | ICD-10-CM | POA: Insufficient documentation

## 2014-07-09 DIAGNOSIS — J45909 Unspecified asthma, uncomplicated: Secondary | ICD-10-CM | POA: Insufficient documentation

## 2014-07-09 DIAGNOSIS — G8929 Other chronic pain: Secondary | ICD-10-CM | POA: Diagnosis not present

## 2014-07-09 DIAGNOSIS — Z8673 Personal history of transient ischemic attack (TIA), and cerebral infarction without residual deficits: Secondary | ICD-10-CM | POA: Insufficient documentation

## 2014-07-09 DIAGNOSIS — G4733 Obstructive sleep apnea (adult) (pediatric): Secondary | ICD-10-CM | POA: Insufficient documentation

## 2014-07-09 DIAGNOSIS — E039 Hypothyroidism, unspecified: Secondary | ICD-10-CM | POA: Insufficient documentation

## 2014-07-09 DIAGNOSIS — R739 Hyperglycemia, unspecified: Secondary | ICD-10-CM | POA: Diagnosis present

## 2014-07-09 DIAGNOSIS — I10 Essential (primary) hypertension: Secondary | ICD-10-CM | POA: Insufficient documentation

## 2014-07-09 DIAGNOSIS — Z23 Encounter for immunization: Secondary | ICD-10-CM | POA: Diagnosis not present

## 2014-07-09 DIAGNOSIS — E1165 Type 2 diabetes mellitus with hyperglycemia: Principal | ICD-10-CM | POA: Insufficient documentation

## 2014-07-09 DIAGNOSIS — F410 Panic disorder [episodic paroxysmal anxiety] without agoraphobia: Secondary | ICD-10-CM | POA: Diagnosis not present

## 2014-07-09 DIAGNOSIS — E139 Other specified diabetes mellitus without complications: Secondary | ICD-10-CM | POA: Diagnosis present

## 2014-07-09 DIAGNOSIS — Z87891 Personal history of nicotine dependence: Secondary | ICD-10-CM | POA: Insufficient documentation

## 2014-07-09 DIAGNOSIS — G894 Chronic pain syndrome: Secondary | ICD-10-CM | POA: Insufficient documentation

## 2014-07-09 DIAGNOSIS — E785 Hyperlipidemia, unspecified: Secondary | ICD-10-CM | POA: Diagnosis not present

## 2014-07-09 DIAGNOSIS — Z882 Allergy status to sulfonamides status: Secondary | ICD-10-CM | POA: Diagnosis not present

## 2014-07-09 DIAGNOSIS — Z6841 Body Mass Index (BMI) 40.0 and over, adult: Secondary | ICD-10-CM | POA: Diagnosis not present

## 2014-07-09 DIAGNOSIS — E872 Acidosis: Secondary | ICD-10-CM | POA: Diagnosis not present

## 2014-07-09 DIAGNOSIS — F419 Anxiety disorder, unspecified: Secondary | ICD-10-CM | POA: Diagnosis not present

## 2014-07-09 DIAGNOSIS — F411 Generalized anxiety disorder: Secondary | ICD-10-CM | POA: Diagnosis present

## 2014-07-09 DIAGNOSIS — M545 Low back pain: Secondary | ICD-10-CM | POA: Insufficient documentation

## 2014-07-09 DIAGNOSIS — M1710 Unilateral primary osteoarthritis, unspecified knee: Secondary | ICD-10-CM | POA: Insufficient documentation

## 2014-07-09 DIAGNOSIS — R300 Dysuria: Secondary | ICD-10-CM | POA: Insufficient documentation

## 2014-07-09 DIAGNOSIS — M79673 Pain in unspecified foot: Secondary | ICD-10-CM | POA: Diagnosis not present

## 2014-07-09 DIAGNOSIS — E1159 Type 2 diabetes mellitus with other circulatory complications: Secondary | ICD-10-CM | POA: Diagnosis present

## 2014-07-09 DIAGNOSIS — M419 Scoliosis, unspecified: Secondary | ICD-10-CM | POA: Diagnosis not present

## 2014-07-09 DIAGNOSIS — Z794 Long term (current) use of insulin: Secondary | ICD-10-CM | POA: Diagnosis not present

## 2014-07-09 DIAGNOSIS — M353 Polymyalgia rheumatica: Secondary | ICD-10-CM | POA: Diagnosis not present

## 2014-07-09 DIAGNOSIS — J449 Chronic obstructive pulmonary disease, unspecified: Secondary | ICD-10-CM | POA: Insufficient documentation

## 2014-07-09 DIAGNOSIS — K760 Fatty (change of) liver, not elsewhere classified: Secondary | ICD-10-CM | POA: Diagnosis not present

## 2014-07-09 DIAGNOSIS — Z85850 Personal history of malignant neoplasm of thyroid: Secondary | ICD-10-CM | POA: Insufficient documentation

## 2014-07-09 DIAGNOSIS — Z888 Allergy status to other drugs, medicaments and biological substances status: Secondary | ICD-10-CM | POA: Diagnosis not present

## 2014-07-09 LAB — BASIC METABOLIC PANEL
Anion gap: 13 (ref 5–15)
BUN: 14 mg/dL (ref 6–23)
CALCIUM: 8.8 mg/dL (ref 8.4–10.5)
CO2: 24 mEq/L (ref 19–32)
Chloride: 99 mEq/L (ref 96–112)
Creatinine, Ser: 0.68 mg/dL (ref 0.50–1.10)
GFR calc non Af Amer: >90 mL/min (ref >90)
Glucose, Bld: 140 mg/dL — ABNORMAL HIGH (ref 70–99)
POTASSIUM: 3.7 meq/L (ref 3.7–5.3)
SODIUM: 136 meq/L — AB (ref 137–147)

## 2014-07-09 LAB — I-STAT CHEM 8, ED
BUN: 14 mg/dL (ref 6–23)
Calcium, Ion: 1.03 mmol/L — ABNORMAL LOW (ref 1.12–1.23)
Chloride: 94 mEq/L — ABNORMAL LOW (ref 96–112)
Creatinine, Ser: 0.8 mg/dL (ref 0.50–1.10)
Glucose, Bld: >700 mg/dL (ref 70–99)
HCT: 48 % — ABNORMAL HIGH (ref 36.0–46.0)
HEMOGLOBIN: 16.3 g/dL — AB (ref 12.0–15.0)
POTASSIUM: 4 meq/L (ref 3.7–5.3)
SODIUM: 131 meq/L — AB (ref 137–147)
TCO2: 22 mmol/L (ref 0–100)

## 2014-07-09 LAB — COMPREHENSIVE METABOLIC PANEL
ALT: 36 U/L — ABNORMAL HIGH (ref 0–35)
AST: 42 U/L — ABNORMAL HIGH (ref 0–37)
Albumin: 3.5 g/dL (ref 3.5–5.2)
Alkaline Phosphatase: 155 U/L — ABNORMAL HIGH (ref 39–117)
Anion gap: 19 — ABNORMAL HIGH (ref 5–15)
BUN: 13 mg/dL (ref 6–23)
CALCIUM: 9 mg/dL (ref 8.4–10.5)
CO2: 20 meq/L (ref 19–32)
Chloride: 89 mEq/L — ABNORMAL LOW (ref 96–112)
Creatinine, Ser: 0.84 mg/dL (ref 0.50–1.10)
GFR, EST AFRICAN AMERICAN: 87 mL/min — AB (ref >90)
GFR, EST NON AFRICAN AMERICAN: 75 mL/min — AB (ref >90)
GLUCOSE: 696 mg/dL — AB (ref 70–99)
Potassium: 4.2 mEq/L (ref 3.7–5.3)
Sodium: 128 mEq/L — ABNORMAL LOW (ref 137–147)
Total Bilirubin: 0.3 mg/dL (ref 0.3–1.2)
Total Protein: 7.4 g/dL (ref 6.0–8.3)

## 2014-07-09 LAB — URINALYSIS, ROUTINE W REFLEX MICROSCOPIC
BILIRUBIN URINE: NEGATIVE
Glucose, UA: >1000 mg/dL — AB
HGB URINE DIPSTICK: NEGATIVE
KETONES UR: NEGATIVE mg/dL
LEUKOCYTES UA: NEGATIVE
NITRITE: NEGATIVE
PROTEIN: NEGATIVE mg/dL
Specific Gravity, Urine: 1.035 — ABNORMAL HIGH (ref 1.005–1.030)
Urobilinogen, UA: 0.2 mg/dL (ref 0.0–1.0)
pH: 5 (ref 5.0–8.0)

## 2014-07-09 LAB — KETONES, QUALITATIVE: ACETONE BLD: NEGATIVE

## 2014-07-09 LAB — I-STAT VENOUS BLOOD GAS, ED
ACID-BASE EXCESS: 1 mmol/L (ref 0.0–2.0)
Bicarbonate: 27.5 mEq/L — ABNORMAL HIGH (ref 20.0–24.0)
O2 SAT: 50 %
PO2 VEN: 29 mmHg — AB (ref 30.0–45.0)
TCO2: 29 mmol/L (ref 0–100)
pCO2, Ven: 51.1 mmHg — ABNORMAL HIGH (ref 45.0–50.0)
pH, Ven: 7.339 — ABNORMAL HIGH (ref 7.250–7.300)

## 2014-07-09 LAB — CBC
HCT: 43.6 % (ref 36.0–46.0)
Hemoglobin: 13.9 g/dL (ref 12.0–15.0)
MCH: 26.5 pg (ref 26.0–34.0)
MCHC: 31.9 g/dL (ref 30.0–36.0)
MCV: 83.2 fL (ref 78.0–100.0)
PLATELETS: 223 10*3/uL (ref 150–400)
RBC: 5.24 MIL/uL — AB (ref 3.87–5.11)
RDW: 13.6 % (ref 11.5–15.5)
WBC: 11.5 10*3/uL — AB (ref 4.0–10.5)

## 2014-07-09 LAB — GLUCOSE, CAPILLARY
GLUCOSE-CAPILLARY: 235 mg/dL — AB (ref 70–99)
Glucose-Capillary: 174 mg/dL — ABNORMAL HIGH (ref 70–99)
Glucose-Capillary: 129 mg/dL — ABNORMAL HIGH (ref 70–99)

## 2014-07-09 LAB — URINE MICROSCOPIC-ADD ON

## 2014-07-09 LAB — CBG MONITORING, ED
Glucose-Capillary: >600 mg/dL (ref 70–99)
Glucose-Capillary: 522 mg/dL — ABNORMAL HIGH (ref 70–99)
Glucose-Capillary: 381 mg/dL — ABNORMAL HIGH (ref 70–99)
Glucose-Capillary: 334 mg/dL — ABNORMAL HIGH (ref 70–99)

## 2014-07-09 LAB — I-STAT CG4 LACTIC ACID, ED
Lactic Acid, Venous: 4.73 mmol/L — ABNORMAL HIGH (ref 0.5–2.2)
Lactic Acid, Venous: 2.12 mmol/L (ref 0.5–2.2)

## 2014-07-09 MED ORDER — SODIUM CHLORIDE 0.9 % IV SOLN
INTRAVENOUS | Status: DC
  Administered 2014-07-09: 4.6 [IU]/h via INTRAVENOUS
  Filled 2014-07-09: qty 2.5

## 2014-07-09 MED ORDER — ENOXAPARIN SODIUM 40 MG/0.4ML ~~LOC~~ SOLN
40.0000 mg | SUBCUTANEOUS | Status: DC
  Administered 2014-07-09: 40 mg via SUBCUTANEOUS
  Filled 2014-07-09: qty 0.4

## 2014-07-09 MED ORDER — INSULIN ASPART 100 UNIT/ML ~~LOC~~ SOLN: 0.0000 [IU] | Freq: Three times a day (TID) | SUBCUTANEOUS | Status: DC

## 2014-07-09 MED ORDER — INSULIN ASPART 100 UNIT/ML ~~LOC~~ SOLN: 0.0000 [IU] | Freq: Every day | SUBCUTANEOUS | Status: DC

## 2014-07-09 MED ORDER — SODIUM CHLORIDE 0.9 % IV BOLUS (SEPSIS)
1000.0000 mL | Freq: Once | INTRAVENOUS | Status: AC
  Administered 2014-07-09: 1000 mL via INTRAVENOUS

## 2014-07-09 MED ORDER — DEXTROSE-NACL 5-0.45 % IV SOLN: INTRAVENOUS | Status: DC

## 2014-07-09 MED ORDER — CLONAZEPAM 0.5 MG PO TABS: 0.5000 mg | ORAL_TABLET | Freq: Every day | ORAL | Status: DC | PRN

## 2014-07-09 MED ORDER — MORPHINE SULFATE ER 30 MG PO TBCR
60.0000 mg | EXTENDED_RELEASE_TABLET | Freq: Two times a day (BID) | ORAL | Status: DC
  Administered 2014-07-09 – 2014-07-10 (×2): 60 mg via ORAL
  Filled 2014-07-09 (×2): qty 2

## 2014-07-09 MED ORDER — MELOXICAM 7.5 MG PO TABS
7.5000 mg | ORAL_TABLET | ORAL | Status: DC
  Administered 2014-07-09: 7.5 mg via ORAL
  Filled 2014-07-09: qty 1

## 2014-07-09 MED ORDER — SODIUM CHLORIDE 0.9 % IV SOLN
INTRAVENOUS | Status: DC
  Administered 2014-07-10 (×2): via INTRAVENOUS

## 2014-07-09 MED ORDER — SODIUM CHLORIDE 0.9 % IV SOLN
INTRAVENOUS | Status: DC
  Administered 2014-07-09: 21:00:00 via INTRAVENOUS

## 2014-07-09 MED ORDER — INSULIN ASPART 100 UNIT/ML ~~LOC~~ SOLN
0.0000 [IU] | Freq: Three times a day (TID) | SUBCUTANEOUS | Status: DC
  Administered 2014-07-10: 3 [IU] via SUBCUTANEOUS
  Administered 2014-07-10: 5 [IU] via SUBCUTANEOUS

## 2014-07-09 MED ORDER — DEXTROSE-NACL 5-0.45 % IV SOLN
INTRAVENOUS | Status: DC
  Administered 2014-07-09: via INTRAVENOUS

## 2014-07-09 MED ORDER — INSULIN GLARGINE 100 UNIT/ML ~~LOC~~ SOLN
30.0000 [IU] | Freq: Every day | SUBCUTANEOUS | Status: DC
  Administered 2014-07-10: 30 [IU] via SUBCUTANEOUS
  Filled 2014-07-09 (×2): qty 0.3

## 2014-07-09 MED ORDER — INSULIN GLARGINE 100 UNIT/ML ~~LOC~~ SOLN
30.0000 [IU] | Freq: Every day | SUBCUTANEOUS | Status: DC
  Filled 2014-07-09: qty 0.3

## 2014-07-09 MED ORDER — LEVOTHYROXINE SODIUM 137 MCG PO TABS
137.0000 ug | ORAL_TABLET | Freq: Every day | ORAL | Status: DC
  Administered 2014-07-10: 137 ug via ORAL
  Filled 2014-07-09 (×2): qty 1

## 2014-07-09 MED ORDER — AMITRIPTYLINE HCL 100 MG PO TABS
100.0000 mg | ORAL_TABLET | Freq: Every day | ORAL | Status: DC
  Administered 2014-07-09: 100 mg via ORAL
  Filled 2014-07-09 (×2): qty 1

## 2014-07-09 NOTE — ED Notes (Signed)
Dr. Regenia Skeeter was shown the patients critical Blood gas, venous.

## 2014-07-09 NOTE — ED Notes (Signed)
Transporting Patient to new room assignment. 

## 2014-07-09 NOTE — ED Notes (Signed)
Dr. Regenia Skeeter was shown the patients critical I-stat chem 8.

## 2014-07-09 NOTE — ED Notes (Signed)
Dr. Rogene Houston was notified of the patients I-Stat CG4 Lactic Acid of 4.73.

## 2014-07-09 NOTE — ED Notes (Signed)
Pt. Reports hyperglycemia for approx 1 week. States has not been able to afford insulin. Last took lantus 35 units last night and 60 units novolog. Reports took her CBG today and it read high. Reports dizziness, increased thirst and increased urination. Also reports possible UTI/yeast infection. CBG checked in triage >600

## 2014-07-09 NOTE — ED Provider Notes (Signed)
CSN: [id number]     Arrival date & time 07/09/14  1519 History   First MD Initiated Contact with Patient 07/09/14 1540     Chief Complaint  Patient presents with  . Hyperglycemia     (Consider location/radiation/quality/duration/timing/severity/associated sxs/prior Treatment) HPI Comments: This is a 58 year old morbidly obese female with a past medical history of type 2 diabetes, chronic narcotic use, hypertension, chronic pain, depression, asthma, polymyalgia rheumatica, COPD and TIA who presents to the emergency department with her husband with hyperglycemia. Patient reports over the past week her blood sugar has been running "very high". She states she is unable to afford her insulin so she has been "spreading it out" and taking it sporadically. She endorses increased fatigue, occasional shortness of breath on exertion, polyuria and polydipsia. She states she believes she has a urinary tract infection as she has been urinating very frequently with slight burning. Denies chest pain, abdominal pain, nausea, vomiting, diarrhea, vaginal discharge, numbness or confusion.  Patient is a 58 y.o. female presenting with hyperglycemia. The history is provided by the patient and the spouse.  Hyperglycemia   Past Medical History  Diagnosis Date  . Anxiety     with panic attacks  . Insomnia   . COPD (chronic obstructive pulmonary disease)   . Osteoarthritis     with severe disease in knee  . Polymyalgia rheumatica   . Fatty liver disease, nonalcoholic   . Scoliosis   . Asthma   . Depression   . History of TIA (transient ischemic attack) 11-01-2010--  NO RESIDUAL  . Hyperlipidemia   . DJD (degenerative joint disease)   . Lumbar stenosis   . Chronic pain syndrome     [facility]  . History of hypertension S/P GASTRIC BANDING--- NO ISSUE SINCE WT. LOSS  . OSA (obstructive sleep apnea) NO CPAP SINCE WT LOSS  . Seasonal allergies   . Chronic narcotic use   . Frequency of  urination   . Urgency of urination   . Nocturia   . Vaginal pain S/P SLING  FEB 2012  . Diabetes mellitus type II STATES LAST A1C 6.4   Past Surgical History  Procedure Laterality Date  . Appendectomy  1982  . Tubal ligation  1983  . Hysteroscopy w/d&c  08-19-2007  &  05-16-2003    2008 PMB/  2004 -- ALSO CRYOABLATION & LEEP FOR ABNORMAL PAP SMEAR  . Laparoscopic gastric banding  03/01/2006    TRUNCAL VAGOTOMY/ PLACEMENT OF VG BAND  . Knee arthroscopy w/ debridement  03/29/2006    INTERNAL DERANGEMENT/ SEVERE DJD/ MENISCUS TEARS  . Transvaginal subureteral tape/ sling  09-28-2010    MIXED URINARY INCONTINENCE  . Revision left total knee arthroplasty, poly revision  08-29-2008  . Total knee arthroplasty  01-23-2007    SEVERE DJD  . Total thyroidectomy  11-22-2005    BILATERAL THYROID NODULES-- PAPILLARY CARCINOMA (0.5CM)/ ADENOMATOID NODULES  . Left breast lumpectomy  02-28-2005    ATYPICAL DUCTAL HYPERPLASIA  . Cardiac catheterization  09-04-2004    NORMAL CORONARY ANATOMY/ NORMAL LVF/ EF 60%  . Laparoscopic cholecystectomy  06-10-2002  . Tonsillectomy  1969  . Transthoracic echocardiogram  12-27-2010    LVSF NORMAL / EF 91-63%/ GRADE I DIASTOLIC DYSFUNCTION/ MILD MITRAL REGURG. / MILDLY DILATED LEFT ATRIUM/ MILDY INCREASED SYSTOLIC PRESSURE OF PULMONARY ARTERIES  . Cardiovascular stress test  12-27-2010  DR [place]    ABNORMAL NUCLEAR STUDY W/ /MILD INFERIOR ISCHEMIA/ EF 69%/  CT HEART ANGIOGRAM ;  NO ACUTE FINDINGS  . Cystoscopy  05/18/2012    Procedure: CYSTOSCOPY;  Surgeon: Reece Packer, MD;  Location: [facility];  Service: Urology;  Laterality: N/A;  examination under anethesia   Family History  Problem Relation Age of Onset  . Colon cancer Maternal Uncle     x 2  . Breast cancer Other     great aunts x 5  . Diabetes Mother   . Heart disease Father   . Heart disease Mother    History  Substance Use Topics  . Smoking status: Former Smoker -- 1.00  packs/day for 32 years    Types: Cigarettes    Quit date: 10/22/2010  . Smokeless tobacco: Never Used  . Alcohol Use: No   OB History    No data available     Review of Systems  10 Systems reviewed and are negative for acute change except as noted in the HPI.  Allergies  Losartan and Sulfonamide derivatives  Home Medications   Prior to Admission medications   Medication Sig Start Date End Date Taking? Authorizing Provider  acetaminophen (TYLENOL) 500 MG tablet Take 500 mg by mouth every 4 (four) hours as needed for moderate pain.   Yes Historical Provider, MD  amitriptyline (ELAVIL) 25 MG tablet Take 100 mg by mouth at bedtime.   Yes Historical Provider, MD  cetirizine (ZYRTEC) 10 MG tablet Take 10 mg by mouth daily as needed for allergies.    Yes Historical Provider, MD  clonazePAM (KLONOPIN) 0.5 MG tablet Take 0.5 mg by mouth daily as needed for anxiety.   Yes Historical Provider, MD  ENABLEX 7.5 MG 24 hr tablet Take 7.5 mg by mouth daily.  03/06/11  Yes Historical Provider, MD  ibuprofen (ADVIL,MOTRIN) 200 MG tablet Take 200 mg by mouth every 6 (six) hours as needed for cramping.   Yes Historical Provider, MD  insulin lispro (HUMALOG PEN) 100 UNIT/ML SOPN Inject 10 Units into the skin 3 (three) times daily with meals. 05/11/13  Yes Harden Mo, MD  levothyroxine (SYNTHROID, LEVOTHROID) 137 MCG tablet Take 137 mcg by mouth daily before breakfast.   Yes Historical Provider, MD  meloxicam (MOBIC) 7.5 MG tablet Take 7.5 mg by mouth every other day.   Yes Historical Provider, MD  morphine (MS CONTIN) 60 MG 12 hr tablet Take 60 mg by mouth every 12 (twelve) hours.   Yes Historical Provider, MD  potassium chloride SA (K-DUR,KLOR-CON) 20 MEQ tablet Take 20 mEq by mouth as needed (for cramping).  05/30/14  Yes Historical Provider, MD  insulin glargine (LANTUS) 100 UNIT/ML injection Inject 30 Units into the skin daily as needed. 30 UNITS QD WHEN CBG > 200    Historical Provider, MD   BP  151/60 mmHg  Pulse 96  Temp(Src) 98.7 F (37.1 C) (Oral)  Resp 22  Ht 5\' 9"  (1.753 m)  Wt 265 lb (120.203 kg)  BMI 39.12 kg/m2  SpO2 95% Physical Exam  Constitutional: She is oriented to person, place, and time. She appears well-developed and well-nourished. No distress.  Morbidly obese.  HENT:  Head: Normocephalic and atraumatic.  Dry MM.  Eyes: Conjunctivae are normal.  Neck: Normal range of motion. Neck supple.  Cardiovascular: Regular rhythm and normal heart sounds.   Tachycardic.  Pulmonary/Chest: Effort normal and breath sounds normal.  Abdominal: Soft. Bowel sounds are normal. There is no tenderness.  Musculoskeletal: Normal range of motion. She exhibits no edema.  Neurological: She is alert and oriented to  person, place, and time.  Skin: Skin is warm and dry. She is not diaphoretic.  Psychiatric: She has a normal mood and affect. Her behavior is normal.  Nursing note and vitals reviewed.   ED Course  Procedures (including critical care time) Labs Review Labs Reviewed  CBC - Abnormal; Notable for the following:    WBC 11.5 (*)    RBC 5.24 (*)    All other components within normal limits  COMPREHENSIVE METABOLIC PANEL - Abnormal; Notable for the following:    Sodium 128 (*)    Chloride 89 (*)    Glucose, Bld 696 (*)    AST 42 (*)    ALT 36 (*)    Alkaline Phosphatase 155 (*)    GFR calc non Af Amer 75 (*)    GFR calc Af Amer 87 (*)    Anion gap 19 (*)    All other components within normal limits  URINALYSIS, ROUTINE W REFLEX MICROSCOPIC - Abnormal; Notable for the following:    Specific Gravity, Urine 1.035 (*)    Glucose, UA >1000 (*)    All other components within normal limits  URINE MICROSCOPIC-ADD ON - Abnormal; Notable for the following:    Squamous Epithelial / LPF FEW (*)    All other components within normal limits  CBG MONITORING, ED - Abnormal; Notable for the following:    Glucose-Capillary >600 (*)    All other components within normal limits   I-STAT CG4 LACTIC ACID, ED - Abnormal; Notable for the following:    Lactic Acid, Venous 4.73 (*)    All other components within normal limits  I-STAT CHEM 8, ED - Abnormal; Notable for the following:    Sodium 131 (*)    Chloride 94 (*)    Glucose, Bld >700 (*)    Calcium, Ion 1.03 (*)    Hemoglobin 16.3 (*)    HCT 48.0 (*)    All other components within normal limits  CBG MONITORING, ED - Abnormal; Notable for the following:    Glucose-Capillary 522 (*)    All other components within normal limits  KETONES, QUALITATIVE  BLOOD GAS, VENOUS  I-STAT CG4 LACTIC ACID, ED    Imaging Review No results found.   EKG Interpretation None      MDM   Final diagnoses:  Hyperglycemia   Patient presenting with hyperglycemia with glucose greater than 600. She is nontoxic appearing and in no apparent distress. Afebrile, tachycardic on arrival, vitals otherwise stable. Noncompliance with insulin regimen. Elevated lactate of 4.73 most likely from dehydration. Glucose 163 on metabolic panel with an anion gap of 19. Patient started on an insulin drip and given 2 L fluid bolus. She will be admitted for observation. Admission accepted by IMTS, attending Dr. Johnnye Sima, tele bed.   Carman Ching, PA-C 07/09/14 1757  Ephraim Hamburger, MD 07/10/14 [id number]

## 2014-07-09 NOTE — H&P (Signed)
Date: 07/09/2014               Patient Name:  Mary Acosta MRN: [id number]  DOB: Mar 13, 1956 Age / Sex: 58 y.o., female   PCP: Chesley Noon, MD         Medical Service: Internal Medicine Teaching Service         Attending Physician: Dr. Campbell Riches, MD    First Contact: Dr. Dellia Nims Pager: [phone]  Second Contact: Dr. Natasha Bence Pager: [phone]       After Hours (After 5p/  First Contact Pager: [phone]  weekends / holidays): Second Contact Pager: [phone]   Chief Complaint: "high blood sugars"  History of Present Illness: [name] is a 58 year old woman with DM2, HTN, hypothyroidism,  chronic pain and anxiety who presents for hyperglycemia. She said she has not been able to afford her insulin. Her DM2 is managed by Dr Debbora Presto (endocrinologist) and she reports she is supposed to take lantus 55 u and a sliding scale of humalog. She has been stretching out her insulin to make it last longer and notes for the past few days she has been weak and lightheaded. Either today or yesterday (patient is unsure), she checked her sugar and it said "high" so she took 30 u of humalog and borrowed someone elses lantus and took 85u. She is unsure when she did this. She then came to the ED this afternoon. She also notes polyuria and dysuria of a few days. She has some nausea and polydipsia. She denies chest pain, SOB, abdominal pain, diarrhea, constipation, emesis, fever, chills, night sweats, headache, presyncope.   In the ED, [name] had a CBG of >700 received 2L NS and was started on insulin drip. She was noted to have an anion gap of 19 with bicarb 20. I-stat lactic acid was 4.7 but blood draw lactic acid was 2.12. She was admitted for observation  Meds: Current Facility-Administered Medications  Medication Dose Route Frequency Provider Last Rate Last Dose  . 0.9 %  sodium chloride infusion   Intravenous Continuous Jessee Avers, MD      . amitriptyline (ELAVIL) tablet 100  mg  100 mg Oral QHS Jessee Avers, MD      . clonazePAM [facility]) tablet 0.5 mg  0.5 mg Oral Daily PRN Jessee Avers, MD      . enoxaparin (LOVENOX) injection 40 mg  40 mg Subcutaneous Q24H Jessee Avers, MD      . insulin regular (NOVOLIN R,HUMULIN R) 250 Units in sodium chloride 0.9 % 250 mL (1 Units/mL) infusion   Intravenous Continuous Carman Ching, PA-C 3.5 mL/hr at 07/09/14 2031 3.5 Units/hr at 07/09/14 2031  . [START ON 07/10/2014] levothyroxine (SYNTHROID, LEVOTHROID) tablet 137 mcg  137 mcg Oral QAC breakfast Jessee Avers, MD      . meloxicam [facility]) tablet 7.5 mg  7.5 mg Oral QODAY Jessee Avers, MD      . morphine (MS CONTIN) 12 hr tablet 60 mg  60 mg Oral Q12H Jessee Avers, MD        Allergies: Allergies as of 07/09/2014 - Review Complete 07/09/2014  Allergen Reaction Noted  . Losartan Other (See Comments) 07/09/2014  . Sulfonamide derivatives Nausea Only    Past Medical History  Diagnosis Date  . Anxiety     with panic attacks  . Insomnia   . COPD (chronic obstructive pulmonary disease)   . Osteoarthritis     with severe disease in  knee  . Polymyalgia rheumatica   . Fatty liver disease, nonalcoholic   . Scoliosis   . Asthma   . Depression   . History of TIA (transient ischemic attack) 11-01-2010--  NO RESIDUAL  . Hyperlipidemia   . DJD (degenerative joint disease)   . Lumbar stenosis   . Chronic pain syndrome     [facility] [place]  . History of hypertension S/P GASTRIC BANDING--- NO ISSUE SINCE WT. LOSS  . OSA (obstructive sleep apnea) NO CPAP SINCE WT LOSS  . Seasonal allergies   . Chronic narcotic use   . Frequency of urination   . Urgency of urination   . Nocturia   . Vaginal pain S/P SLING  FEB 2012  . Diabetes mellitus type II STATES LAST A1C 6.4   Past Surgical History  Procedure Laterality Date  . Appendectomy  1982  . Tubal ligation  1983  . Hysteroscopy w/d&c  08-19-2007  &  05-16-2003    2008 PMB/  2004 -- ALSO  CRYOABLATION & LEEP FOR ABNORMAL PAP SMEAR  . Laparoscopic gastric banding  03/01/2006    TRUNCAL VAGOTOMY/ PLACEMENT OF VG BAND  . Knee arthroscopy w/ debridement  03/29/2006    INTERNAL DERANGEMENT/ SEVERE DJD/ MENISCUS TEARS  . Transvaginal subureteral tape/ sling  09-28-2010    MIXED URINARY INCONTINENCE  . Revision left total knee arthroplasty, poly revision  08-29-2008  . Total knee arthroplasty  01-23-2007    SEVERE DJD  . Total thyroidectomy  11-22-2005    BILATERAL THYROID NODULES-- PAPILLARY CARCINOMA (0.5CM)/ ADENOMATOID NODULES  . Left breast lumpectomy  02-28-2005    ATYPICAL DUCTAL HYPERPLASIA  . Cardiac catheterization  09-04-2004    NORMAL CORONARY ANATOMY/ NORMAL LVF/ EF 60%  . Laparoscopic cholecystectomy  06-10-2002  . Tonsillectomy  1969  . Transthoracic echocardiogram  12-27-2010    LVSF NORMAL / EF 27-74%/ GRADE I DIASTOLIC DYSFUNCTION/ MILD MITRAL REGURG. / MILDLY DILATED LEFT ATRIUM/ MILDY INCREASED SYSTOLIC PRESSURE OF PULMONARY ARTERIES  . Cardiovascular stress test  12-27-2010  DR [place]    ABNORMAL NUCLEAR STUDY W/ /MILD INFERIOR ISCHEMIA/ EF 69%/  CT HEART ANGIOGRAM ;  NO ACUTE FINDINGS  . Cystoscopy  05/18/2012    Procedure: CYSTOSCOPY;  Surgeon: Reece Packer, MD;  Location: [facility];  Service: Urology;  Laterality: N/A;  examination under anethesia   Family History  Problem Relation Age of Onset  . Colon cancer Maternal Uncle     x 2  . Breast cancer Other     great aunts x 5  . Diabetes Mother   . Heart disease Father   . Heart disease Mother    History   Social History  . Marital Status: Married    Spouse Name: N/A    Number of Children: 2  . Years of Education: N/A   Occupational History  . disabled    Social History Main Topics  . Smoking status: Former Smoker -- 1.00 packs/day for 32 years    Types: Cigarettes    Quit date: 10/22/2010  . Smokeless tobacco: Never Used  . Alcohol Use: No  . Drug Use: No  .  Sexual Activity: Not on file   Other Topics Concern  . Not on file   Social History Narrative    Review of Systems: A comprehensive review of systems was negative except for: as noted above per HPI  Physical Exam: Blood pressure 135/78, pulse 84, temperature 98.5 F (36.9 C), temperature  source Oral, resp. rate 18, height '5\' 9"'  (1.753 m), weight 278 lb 1.6 oz (126.145 kg), SpO2 100 %.  Gen: A&O x 4, no acute distress, obese, well nourished HEENT: Atraumatic, PERRL, EOMI, sclerae anicteric, moist mucous membranes Neck: No carotid bruits or JVD Heart: Regular rate and rhythm, normal S1 S2, no murmurs, rubs, or gallops Lungs: Clear to auscultation bilaterally, respirations unlabored Abd/Back: Soft, non-tender including suprapubic and CVA, non-distended, + bowel sounds, no hepatosplenomegaly, striae present Ext: No edema or cyanosis, well healed scar on L knee   Lab results: Basic Metabolic Panel:  Recent Labs  07/09/14 1600 07/09/14 1614  NA 128* 131*  K 4.2 4.0  CL 89* 94*  CO2 20  --   GLUCOSE 696* >700*  BUN 13 14  CREATININE 0.84 0.80  CALCIUM 9.0  --    Liver Function Tests:  Recent Labs  07/09/14 1600  AST 42*  ALT 36*  ALKPHOS 155*  BILITOT 0.3  PROT 7.4  ALBUMIN 3.5   CBC:  Recent Labs  07/09/14 1600 07/09/14 1614  WBC 11.5*  --   HGB 13.9 16.3*  HCT 43.6 48.0*  MCV 83.2  --   PLT 223  --     CBG:  Recent Labs  07/09/14 1534 07/09/14 1703 07/09/14 1819 07/09/14 1920 07/09/14 2030  GLUCAP >600* 522* 381* 334* 235*   Urine Drug Screen: Drugs of Abuse     Component Value Date/Time   LABOPIA POSITIVE* 10/23/2010 0043   COCAINSCRNUR NONE DETECTED 10/23/2010 0043   LABBENZ POSITIVE* 10/23/2010 0043   AMPHETMU NONE DETECTED 10/23/2010 [place] DETECTED 10/23/2010 0043   LABBARB  10/23/2010 0043    NONE DETECTED        DRUG SCREEN FOR MEDICAL PURPOSES ONLY.  IF CONFIRMATION IS NEEDED FOR ANY PURPOSE, NOTIFY LAB WITHIN 5  DAYS.        LOWEST DETECTABLE LIMITS FOR URINE DRUG SCREEN Drug Class       Cutoff (ng/mL) Amphetamine      1000 Barbiturate      200 Benzodiazepine   245 Tricyclics       809 Opiates          300 Cocaine          300 THC              50    Urinalysis:  Recent Labs  07/09/14 1635  COLORURINE YELLOW  LABSPEC 1.035*  PHURINE 5.0  GLUCOSEU >1000*  HGBUR NEGATIVE  BILIRUBINUR NEGATIVE  KETONESUR NEGATIVE  PROTEINUR NEGATIVE  UROBILINOGEN 0.2  NITRITE NEGATIVE  LEUKOCYTESUR NEGATIVE   Misc. Labs: Lactic acid 2.12 Venous blood gas pH 7.34/ pCO2 51/ pO2 29/ bicarb 28  Imaging results:  No results found.  Other results: EKG: none.  Assessment & Plan by Problem: Principal Problem:   Hyperglycemia Active Problems:   Diabetes 1.5, managed as type 2   Anxiety state   Essential hypertension   #Increased Anion Gap Metabolic Acidosis 2/2 HHS: [name] presented with blood sugars over 700 in the setting of medication non-compliance. She had an anion gap of 19 with bicarb 20 on presentation and received 2 L NS and was started on the insulin drip. When we met her on the floor she was feeling much better but still felt some weakness. She says she cannot afford her medicines but is interested in adhering to her prescribed regimen. She says she takes lantus 55 u qhs and sliding scale  of humalog. This is managed by Dr Debbora Presto of [facility]. There has been concern about adherence in the past as she was seen at urgent care 04/2013 and they note she adjusts her lantus herself depending on how she feels. Her last hemoglobin A1c in our system was several years ago (5.8) and she was unsure what her A1c is. She would likely benefit from social work helping her afford her medicines or considering a cheaper regimen. -repeat BMP now and consider d/c insulin drip -plan for lantus 30 u and sensitive SSI once anion gap close -d/c insulin drip 2 hours after lantus -amitriptyline  100 mg qhs for neuropathy -check HbgA1c -call social work in am  #Dysuria: [name]'s UA was negative and her polyuria was likely secondary to HHS. -continue to monitor  #HTN: [name] says she is prescribed lisinopril of some unknown dose but has not been taking it because her BP has been fine. Here it is in the 110s-150s/50s-70s. -hold antihypertensives for now  #Hypothyroidism: [name] says that she is s/p thyroidectomy secondary to thyroid cancer of unknown pathology. She goes to Dr. Debbora Presto of [facility] for endocrinology. She takes synthroid 137 mcg daily -check TSH -cont synthroid 137 mcg daily  #Chronic pain: [name] has osteoarthritis as well as carpal tunnel syndrome, chronic low back pain, chronic foot pain. She says she takes ms contin 60 mg q12h for her pain and also alternates between ibuprofen and meloxicam 7.5 mg daily for her pain -continue ms contin 60 mg q12h -continue meloxicam 7.5 mg qodprn  #Anxiety: [name] says she takes clonazepam 0.5 mg dailyprn every day for her anxiety -continue clonazepam 0.5 mg dailyprn  #Diet: NPO then carb modified  #DVT ppx: lovenox 40 mg [place] daily  #CODE: FULL  Dispo: Disposition is deferred at this time, awaiting improvement of current medical problems. Anticipated discharge in approximately 1 day(s).   The patient does have a current PCP Annell Greening of [facility]) and does need an [facility] hospital follow-up appointment after discharge.  The patient does not know have transportation limitations that hinder transportation to clinic appointments.  Signed: Kelby Aline, MD 07/09/2014, 9:01 PM

## 2014-07-10 DIAGNOSIS — Z794 Long term (current) use of insulin: Secondary | ICD-10-CM | POA: Diagnosis not present

## 2014-07-10 DIAGNOSIS — E1165 Type 2 diabetes mellitus with hyperglycemia: Secondary | ICD-10-CM | POA: Diagnosis not present

## 2014-07-10 DIAGNOSIS — E872 Acidosis: Secondary | ICD-10-CM | POA: Diagnosis not present

## 2014-07-10 DIAGNOSIS — I10 Essential (primary) hypertension: Secondary | ICD-10-CM

## 2014-07-10 DIAGNOSIS — E039 Hypothyroidism, unspecified: Secondary | ICD-10-CM | POA: Diagnosis not present

## 2014-07-10 LAB — GLUCOSE, CAPILLARY
GLUCOSE-CAPILLARY: 180 mg/dL — AB (ref 70–99)
GLUCOSE-CAPILLARY: 238 mg/dL — AB (ref 70–99)
GLUCOSE-CAPILLARY: 296 mg/dL — AB (ref 70–99)
Glucose-Capillary: 189 mg/dL — ABNORMAL HIGH (ref 70–99)
Glucose-Capillary: 180 mg/dL — ABNORMAL HIGH (ref 70–99)
Glucose-Capillary: 201 mg/dL — ABNORMAL HIGH (ref 70–99)

## 2014-07-10 LAB — BASIC METABOLIC PANEL
Anion gap: 11 (ref 5–15)
BUN: 15 mg/dL (ref 6–23)
CO2: 26 meq/L (ref 19–32)
Calcium: 8.3 mg/dL — ABNORMAL LOW (ref 8.4–10.5)
Chloride: 95 mEq/L — ABNORMAL LOW (ref 96–112)
Creatinine, Ser: 0.73 mg/dL (ref 0.50–1.10)
GFR calc non Af Amer: >90 mL/min (ref >90)
Glucose, Bld: 192 mg/dL — ABNORMAL HIGH (ref 70–99)
POTASSIUM: 4.1 meq/L (ref 3.7–5.3)
Sodium: 132 mEq/L — ABNORMAL LOW (ref 137–147)

## 2014-07-10 LAB — TSH: TSH: 5.47 u[IU]/mL — AB (ref 0.350–4.500)

## 2014-07-10 LAB — HEMOGLOBIN A1C
HEMOGLOBIN A1C: 10.3 % — AB (ref <5.7)
MEAN PLASMA GLUCOSE: 249 mg/dL — AB (ref <117)

## 2014-07-10 LAB — HIV ANTIBODY (ROUTINE TESTING W REFLEX): HIV 1&2 Ab, 4th Generation: NONREACTIVE

## 2014-07-10 MED ORDER — SODIUM CHLORIDE 0.9 % IV SOLN: INTRAVENOUS | Status: DC

## 2014-07-10 MED ORDER — INSULIN GLARGINE 100 UNIT/ML ~~LOC~~ SOLN: 30.0000 [IU] | Freq: Every day | SUBCUTANEOUS | Status: DC

## 2014-07-10 MED ORDER — INSULIN LISPRO 100 UNIT/ML ~~LOC~~ SOLN: 10.0000 [IU] | Freq: Three times a day (TID) | SUBCUTANEOUS | Status: DC

## 2014-07-10 MED ORDER — FLUCONAZOLE 150 MG PO TABS: 150.0000 mg | ORAL_TABLET | Freq: Every day | ORAL | Status: DC

## 2014-07-10 MED ORDER — FLUCONAZOLE 150 MG PO TABS
150.0000 mg | ORAL_TABLET | Freq: Once | ORAL | Status: AC
  Administered 2014-07-10: 150 mg via ORAL
  Filled 2014-07-10: qty 1

## 2014-07-10 MED ORDER — PNEUMOCOCCAL VAC POLYVALENT 25 MCG/0.5ML IJ INJ
0.5000 mL | INJECTION | INTRAMUSCULAR | Status: AC
  Administered 2014-07-10: 0.5 mL via INTRAMUSCULAR
  Filled 2014-07-10: qty 0.5

## 2014-07-10 NOTE — Progress Notes (Signed)
Utilization Review Completed.   Narciso Stoutenburg, RN, BSN Nurse Case Manager  

## 2014-07-10 NOTE — Care Management Note (Signed)
    Page 1 of 1   07/10/2014     3:15:13 PM CARE MANAGEMENT NOTE 07/10/2014  Patient:  Mary Acosta, Mary Acosta   Account Number:  [id number]  Date Initiated:  07/10/2014  Documentation initiated by:  [facility]  Subjective/Objective Assessment:   adm:   Hyperglycemia     Action/Plan:   discharge planning   Anticipated DC Date:  07/10/2014   Anticipated DC Plan:  [place]  CM consult      PAC Choice  NA   Choice offered to / List presented to:  C-1 Patient           Status of service:  Completed, signed off Medicare Important Message given?   (If response is "NO", the following Medicare IM given date fields will be blank) Date Medicare IM given:   Medicare IM given by:   Date Additional Medicare IM given:   Additional Medicare IM given by:    Discharge Disposition:  HOME/SELF CARE  Per UR Regulation:    If discussed at Long Length of Stay Meetings, dates discussed:    Comments:  07/10/14 10:00 MD requested CM to arrange for in house pharmacy to provide a vial of Lantus for pt to take home; MD prearranged with pharmacy.  Cm dropped off prescription for vial, signed and delivered to pt.  CM discussed with pt she needs to secure a different drug plan than the one she has now which does not address her diabetic needs.  Pt states she has already changed to a better med plan but it does not kick in until Jan 2016.  No other needs were communicated.  Mariane Masters, BSN, [place].

## 2014-07-10 NOTE — Discharge Summary (Signed)
Name: Mary Acosta MRN: [id number] DOB: December 22, 1955 58 y.o. PCP: No primary care provider on file.  Date of Admission: 07/09/2014  3:38 PM Date of Discharge: 07/10/2014 Attending Physician: Campbell Riches, MD  Discharge Diagnosis: Principal Problem:   Hyperglycemia Active Problems:   Diabetes 1.5, managed as type 2   Anxiety state   Essential hypertension  Discharge Medications:   Medication List    TAKE these medications        acetaminophen 500 MG tablet  Commonly known as:  TYLENOL  Take 500 mg by mouth every 4 (four) hours as needed for moderate pain.     amitriptyline 25 MG tablet  Commonly known as:  ELAVIL  Take 100 mg by mouth at bedtime.     cetirizine 10 MG tablet  Commonly known as:  ZYRTEC  Take 10 mg by mouth daily as needed for allergies.     clonazePAM 0.5 MG tablet  Commonly known as:  KLONOPIN  Take 0.5 mg by mouth daily as needed for anxiety.     ENABLEX 7.5 MG 24 hr tablet  Generic drug:  darifenacin  Take 7.5 mg by mouth daily.     ibuprofen 200 MG tablet  Commonly known as:  ADVIL,MOTRIN  Take 200 mg by mouth every 6 (six) hours as needed for cramping.     insulin glargine 100 UNIT/ML injection  Commonly known as:  LANTUS  Inject 0.3 mLs (30 Units total) into the skin daily. 30 UNITS QD     insulin lispro 100 UNIT/ML KiwkPen  Commonly known as:  HUMALOG PEN  Inject 10 Units into the skin 3 (three) times daily with meals.     insulin lispro 100 UNIT/ML injection  Commonly known as:  HUMALOG  Inject 0.1 mLs (10 Units total) into the skin 3 (three) times daily before meals.     levothyroxine 137 MCG tablet  Commonly known as:  SYNTHROID, LEVOTHROID  Take 137 mcg by mouth daily before breakfast.     meloxicam 7.5 MG tablet  Commonly known as:  MOBIC  Take 7.5 mg by mouth every other day.     morphine 60 MG 12 hr tablet  Commonly known as:  Mary CONTIN  Take 60 mg by mouth every 12 (twelve) hours.     potassium  chloride SA 20 MEQ tablet  Commonly known as:  K-DUR,KLOR-CON  Take 20 mEq by mouth as needed (for cramping).        Disposition and follow-up:   Mary Acosta was discharged from [facility] in Stable condition.  At the hospital follow up visit please address:  1.  It's vital that she doesn't miss her insulin due to cost. Please make sure she has a regimen that she can afford,  consider changing her to a less expensive insulin . She ran out of her lantus and humalog which caused her to have hyperglycemia and requiring admissions. We helped her get lantus vials short supply from the hospital pharmacy.   Please address her hypothyroidism.  Also, it's unclear if she is on lisinopril. Please add it if not already on her regimen for renal protection.  2.  Labs / imaging needed at time of follow-up:  3.  Pending labs/ test needing follow-up: hgba1c.  Follow-up Appointments:     Follow-up Information    Follow up with Annell Greening.      Discharge Instructions:   Consultations:    Procedures Performed:  No  results found.  2D Echo:   Cardiac Cath:   Admission HPI:  Mary Acosta is a 58 year old woman with DM2, HTN, hypothyroidism, chronic pain and anxiety who presents for hyperglycemia. She said she has not been able to afford her insulin. Her DM2 is managed by Dr Debbora Presto (endocrinologist) and she reports she is supposed to take lantus 55 u and a sliding scale of humalog. She has been stretching out her insulin to make it last longer and notes for the past few days she has been weak and lightheaded. Either today or yesterday (patient is unsure), she checked her sugar and it said "high" so she took 30 u of humalog and borrowed someone elses lantus and took 85u. She is unsure when she did this. She then came to the ED this afternoon. She also notes polyuria and dysuria of a few days. She has some nausea and polydipsia. She denies chest pain, SOB, abdominal  pain, diarrhea, constipation, emesis, fever, chills, night sweats, headache, presyncope.   In the ED, Mary Acosta had a CBG of >700 received 2L NS and was started on insulin drip. She was noted to have an anion gap of 19 with bicarb 20. I-stat lactic acid was 4.7 but blood draw lactic acid was 2.12. She was admitted for observation    Hospital Course by problem list: Principal Problem:   Hyperglycemia Active Problems:   Diabetes 1.5, managed as type 2   Anxiety state   Essential hypertension   58 yo female with DM II, anxiety, chronic pain here with Hyperglycemia >700 due to not being able to afford her insulin regimen.  Hyperglycemia HHS - 2/2 to not being able to afford her insulin -had anion gap 19 and bicarb 20 on presentation, received 2L NS, was on insulin drip. No ketones. Gap closed, transitioned to 30 lantus and SSI. Bg under control with this. Doing well now, tolerating diet and feeling significantly better.  - last hgba1c 5.8 several years ago. Was taking 55u QHS lantus and sliding scale humalog. Couldn't afford these as the lantus by itself costs here $200 monthly. Sees Dr. Debbora Presto Endocrinology. took insulin yesterday from her son in law as she ran out.  - will send her home today with her home regimen. Will give her short supply of the insulin and have her follow up closely with her primary care doctor and let them make changes to her meds to make sure she can afford it. We don't want to make sudden changes in her chronic diabetes management without knowing her well.  HTN - normotensive here. States taking lisinopril but that's not on her med list. Not sure about dosage. We didn't give any here. Should be on it for renal protection.  Hypothyroidism - s/p thyroid surgery for cancer previously.  - TSH 5.470, is on synthyroid 170mcg. Will continue this and should manage meds outpatient.  Fungal vaginitis - likely 2/2 to glucosouria from recent HHS. Feels similar to previous fungal  vaginitis which benefited from diflucan. - treated with 1x diflucan.   Chronic pain: Mary Acosta has osteoarthritis as well as carpal tunnel syndrome, chronic low back pain, chronic foot pain. She says she takes Mary contin 60 mg q12h for her pain and also alternates between ibuprofen and meloxicam 7.5 mg daily for her pain -continue Mary contin 60 mg q12h -continue meloxicam 7.5 mg  Anxiety: Mary Acosta says she takes clonazepam 0.5 mg dailyprn every day for her anxiety -continue clonazepam 0.5 mg dailyprn  Discharge Vitals:   BP 139/76 mmHg  Pulse 80  Temp(Src) 98.6 F (37 C) (Oral)  Resp 18  Ht 5\' 9"  (1.753 m)  Wt 126.145 kg (278 lb 1.6 oz)  BMI 41.05 kg/m2  SpO2 100%  Discharge Labs:  Results for orders placed or performed during the hospital encounter of 07/09/14 (from the past 24 hour(s))  CBG monitoring, ED     Status: Abnormal   Collection Time: 07/09/14  3:34 PM  Result Value Ref Range   Glucose-Capillary >600 (HH) 70 - 99 mg/dL  CBC     Status: Abnormal   Collection Time: 07/09/14  4:00 PM  Result Value Ref Range   WBC 11.5 (H) 4.0 - 10.5 K/uL   RBC 5.24 (H) 3.87 - 5.11 MIL/uL   Hemoglobin 13.9 12.0 - 15.0 g/dL   HCT 43.6 36.0 - 46.0 %   MCV 83.2 78.0 - 100.0 fL   MCH 26.5 26.0 - 34.0 pg   MCHC 31.9 30.0 - 36.0 g/dL   RDW 13.6 11.5 - 15.5 %   Platelets 223 150 - 400 K/uL  Comprehensive metabolic panel     Status: Abnormal   Collection Time: 07/09/14  4:00 PM  Result Value Ref Range   Sodium 128 (L) 137 - 147 mEq/L   Potassium 4.2 3.7 - 5.3 mEq/L   Chloride 89 (L) 96 - 112 mEq/L   CO2 20 19 - 32 mEq/L   Glucose, Bld 696 (HH) 70 - 99 mg/dL   BUN 13 6 - 23 mg/dL   Creatinine, Ser 0.84 0.50 - 1.10 mg/dL   Calcium 9.0 8.4 - 10.5 mg/dL   Total Protein 7.4 6.0 - 8.3 g/dL   Albumin 3.5 3.5 - 5.2 g/dL   AST 42 (H) 0 - 37 U/L   ALT 36 (H) 0 - 35 U/L   Alkaline Phosphatase 155 (H) 39 - 117 U/L   Total Bilirubin 0.3 0.3 - 1.2 mg/dL   GFR calc non Af Amer 75 (L) >90  mL/min   GFR calc Af Amer 87 (L) >90 mL/min   Anion gap 19 (H) 5 - 15  Ketones, qualitative     Status: None   Collection Time: 07/09/14  4:00 PM  Result Value Ref Range   Acetone, Bld NEGATIVE NEGATIVE  I-Stat CG4 Lactic Acid, ED     Status: Abnormal   Collection Time: 07/09/14  4:10 PM  Result Value Ref Range   Lactic Acid, Venous 4.73 (H) 0.5 - 2.2 mmol/L  I-stat chem 8, ed     Status: Abnormal   Collection Time: 07/09/14  4:14 PM  Result Value Ref Range   Sodium 131 (L) 137 - 147 mEq/L   Potassium 4.0 3.7 - 5.3 mEq/L   Chloride 94 (L) 96 - 112 mEq/L   BUN 14 6 - 23 mg/dL   Creatinine, Ser 0.80 0.50 - 1.10 mg/dL   Glucose, Bld >700 (HH) 70 - 99 mg/dL   Calcium, Ion 1.03 (L) 1.12 - 1.23 mmol/L   TCO2 22 0 - 100 mmol/L   Hemoglobin 16.3 (H) 12.0 - 15.0 g/dL   HCT 48.0 (H) 36.0 - 46.0 %   Comment NOTIFIED PHYSICIAN   Urinalysis, Routine w reflex microscopic     Status: Abnormal   Collection Time: 07/09/14  4:35 PM  Result Value Ref Range   Color, Urine YELLOW YELLOW   APPearance CLEAR CLEAR   Specific Gravity, Urine 1.035 (H) 1.005 - 1.030   pH 5.0 5.0 -  8.0   Glucose, UA >1000 (A) NEGATIVE mg/dL   Hgb urine dipstick NEGATIVE NEGATIVE   Bilirubin Urine NEGATIVE NEGATIVE   Ketones, ur NEGATIVE NEGATIVE mg/dL   Protein, ur NEGATIVE NEGATIVE mg/dL   Urobilinogen, UA 0.2 0.0 - 1.0 mg/dL   Nitrite NEGATIVE NEGATIVE   Leukocytes, UA NEGATIVE NEGATIVE  Urine microscopic-add on     Status: Abnormal   Collection Time: 07/09/14  4:35 PM  Result Value Ref Range   Squamous Epithelial / LPF FEW (A) RARE   WBC, UA 0-2 <3 WBC/hpf  CBG monitoring, ED     Status: Abnormal   Collection Time: 07/09/14  5:03 PM  Result Value Ref Range   Glucose-Capillary 522 (H) 70 - 99 mg/dL   Comment 1 Notify RN   I-Stat venous blood gas, ED     Status: Abnormal   Collection Time: 07/09/14  6:03 PM  Result Value Ref Range   pH, Ven 7.339 (H) 7.250 - 7.300   pCO2, Ven 51.1 (H) 45.0 - 50.0 mmHg    pO2, Ven 29.0 (LL) 30.0 - 45.0 mmHg   Bicarbonate 27.5 (H) 20.0 - 24.0 mEq/L   TCO2 29 0 - 100 mmol/L   O2 Saturation 50.0 %   Acid-Base Excess 1.0 0.0 - 2.0 mmol/L   Sample type VENOUS    Comment NOTIFIED PHYSICIAN   I-Stat CG4 Lactic Acid, ED     Status: None   Collection Time: 07/09/14  6:05 PM  Result Value Ref Range   Lactic Acid, Venous 2.12 0.5 - 2.2 mmol/L  CBG monitoring, ED     Status: Abnormal   Collection Time: 07/09/14  6:19 PM  Result Value Ref Range   Glucose-Capillary 381 (H) 70 - 99 mg/dL  CBG monitoring, ED     Status: Abnormal   Collection Time: 07/09/14  7:20 PM  Result Value Ref Range   Glucose-Capillary 334 (H) 70 - 99 mg/dL  Glucose, capillary     Status: Abnormal   Collection Time: 07/09/14  8:30 PM  Result Value Ref Range   Glucose-Capillary 235 (H) 70 - 99 mg/dL  Glucose, capillary     Status: Abnormal   Collection Time: 07/09/14  9:39 PM  Result Value Ref Range   Glucose-Capillary 174 (H) 70 - 99 mg/dL  Basic metabolic panel     Status: Abnormal   Collection Time: 07/09/14 10:35 PM  Result Value Ref Range   Sodium 136 (L) 137 - 147 mEq/L   Potassium 3.7 3.7 - 5.3 mEq/L   Chloride 99 96 - 112 mEq/L   CO2 24 19 - 32 mEq/L   Glucose, Bld 140 (H) 70 - 99 mg/dL   BUN 14 6 - 23 mg/dL   Creatinine, Ser 0.68 0.50 - 1.10 mg/dL   Calcium 8.8 8.4 - 10.5 mg/dL   GFR calc non Af Amer >90 >90 mL/min   GFR calc Af Amer >90 >90 mL/min   Anion gap 13 5 - 15  Glucose, capillary     Status: Abnormal   Collection Time: 07/09/14 10:57 PM  Result Value Ref Range   Glucose-Capillary 129 (H) 70 - 99 mg/dL   Comment 1 Documented in Chart    Comment 2 Notify RN   Glucose, capillary     Status: Abnormal   Collection Time: 07/10/14 12:24 AM  Result Value Ref Range   Glucose-Capillary 180 (H) 70 - 99 mg/dL   Comment 1 Documented in Chart    Comment 2 Notify RN  Glucose, capillary     Status: Abnormal   Collection Time: 07/10/14  1:40 AM  Result Value Ref Range    Glucose-Capillary 238 (H) 70 - 99 mg/dL   Comment 1 Documented in Chart    Comment 2 Notify RN   Glucose, capillary     Status: Abnormal   Collection Time: 07/10/14  2:46 AM  Result Value Ref Range   Glucose-Capillary 189 (H) 70 - 99 mg/dL  Basic metabolic panel     Status: Abnormal   Collection Time: 07/10/14  5:20 AM  Result Value Ref Range   Sodium 132 (L) 137 - 147 mEq/L   Potassium 4.1 3.7 - 5.3 mEq/L   Chloride 95 (L) 96 - 112 mEq/L   CO2 26 19 - 32 mEq/L   Glucose, Bld 192 (H) 70 - 99 mg/dL   BUN 15 6 - 23 mg/dL   Creatinine, Ser 0.73 0.50 - 1.10 mg/dL   Calcium 8.3 (L) 8.4 - 10.5 mg/dL   GFR calc non Af Amer >90 >90 mL/min   GFR calc Af Amer >90 >90 mL/min   Anion gap 11 5 - 15  TSH     Status: Abnormal   Collection Time: 07/10/14  5:20 AM  Result Value Ref Range   TSH 5.470 (H) 0.350 - 4.500 uIU/mL  Glucose, capillary     Status: Abnormal   Collection Time: 07/10/14  5:42 AM  Result Value Ref Range   Glucose-Capillary 180 (H) 70 - 99 mg/dL  Glucose, capillary     Status: Abnormal   Collection Time: 07/10/14  8:15 AM  Result Value Ref Range   Glucose-Capillary 201 (H) 70 - 99 mg/dL    Signed: Dellia Nims, MD 07/10/2014, 10:33 AM    Services Ordered on Discharge:  Equipment Ordered on Discharge:

## 2014-07-10 NOTE — Discharge Summary (Signed)
[name] to be D/C'd Home per MD order.  Discussed with the patient and all questions fully answered.    Medication List    TAKE these medications        acetaminophen 500 MG tablet  Commonly known as:  TYLENOL  Take 500 mg by mouth every 4 (four) hours as needed for moderate pain.     amitriptyline 25 MG tablet  Commonly known as:  ELAVIL  Take 100 mg by mouth at bedtime.     cetirizine 10 MG tablet  Commonly known as:  ZYRTEC  Take 10 mg by mouth daily as needed for allergies.     clonazePAM 0.5 MG tablet  Commonly known as:  KLONOPIN  Take 0.5 mg by mouth daily as needed for anxiety.     ENABLEX 7.5 MG 24 hr tablet  Generic drug:  darifenacin  Take 7.5 mg by mouth daily.     ibuprofen 200 MG tablet  Commonly known as:  ADVIL,MOTRIN  Take 200 mg by mouth every 6 (six) hours as needed for cramping.     insulin glargine 100 UNIT/ML injection  Commonly known as:  LANTUS  Inject 0.3 mLs (30 Units total) into the skin daily. 30 UNITS QD     insulin lispro 100 UNIT/ML KiwkPen  Commonly known as:  HUMALOG PEN  Inject 10 Units into the skin 3 (three) times daily with meals.     insulin lispro 100 UNIT/ML injection  Commonly known as:  HUMALOG  Inject 0.1 mLs (10 Units total) into the skin 3 (three) times daily before meals.     levothyroxine 137 MCG tablet  Commonly known as:  SYNTHROID, LEVOTHROID  Take 137 mcg by mouth daily before breakfast.     meloxicam 7.5 MG tablet  Commonly known as:  MOBIC  Take 7.5 mg by mouth every other day.     morphine 60 MG 12 hr tablet  Commonly known as:  MS CONTIN  Take 60 mg by mouth every 12 (twelve) hours.     potassium chloride SA 20 MEQ tablet  Commonly known as:  K-DUR,KLOR-CON  Take 20 mEq by mouth as needed (for cramping).        VVS, Skin clean, dry and intact without evidence of skin break down, no evidence of skin tears noted. IV catheter discontinued intact. Site without signs and symptoms of  complications. Dressing and pressure applied.  An After Visit Summary was printed and given to the patient.  D/c education completed with patient/family including follow up instructions, medication list, d/c activities limitations if indicated, with other d/c instructions as indicated by MD - patient able to verbalize understanding, all questions fully answered.   Patient instructed to return to ED, call 911, or call MD for any changes in condition.   Patient escorted via [place], and D/C home via private auto.  Audria Nine F 07/10/2014 3:04 PM

## 2014-07-10 NOTE — Discharge Instructions (Signed)
It was a pleasure taking care of you.You were admitted with hyperglycemia. It's very important to make sure you take your insulin daily. The hospital pharmacy will help you with some insulin supply short term. You should see your primary doctor immediately to make sure they can prescribe you some regimen that you can afford to take.

## 2014-07-10 NOTE — Plan of Care (Signed)
Problem: Phase I Progression Outcomes Goal: Pain controlled with appropriate interventions Outcome: Completed/Met Date Met:  07/10/14 Goal: OOB as tolerated unless otherwise ordered Outcome: Completed/Met Date Met:  07/10/14 Goal: Voiding-avoid urinary catheter unless indicated Outcome: Completed/Met Date Met:  07/10/14 Goal: Other Phase I Outcomes/Goals Outcome: Not Applicable Date Met:  79/55/83  Problem: Phase II Progression Outcomes Goal: Obtain order to discontinue catheter if appropriate Outcome: Not Applicable Date Met:  16/74/25

## 2014-07-10 NOTE — Progress Notes (Signed)
  Date: 07/10/2014  Patient name: Mary Acosta  Medical record number: [id number]  Date of birth: 25-May-1956   This patient has been seen and the plan of care was discussed with the house staff. Please see their note for complete details. I concur with their findings with the following additions/corrections:  58 yo F with hx of DM who comes to ED after runnning out of insulin at home. She had borrowed insulin from her son-in-law in attempt to bridge her til she could afford to refill her rx but by 11-14 she developed polyuria, dysuria, polydipsia. She had FSG readings of "high".  In ED she was found to have Glc > 700 with AG of 19, lactic acid of 2.12. She was started on insulin drip.  This AM she is feeling better.  Today's Vitals   07/09/14 2136 07/09/14 2229 07/10/14 0500 07/10/14 1002  BP:   139/76   Pulse:   80   Temp:   98.6 F (37 C)   TempSrc:   Oral   Resp:   18   Height:      Weight:      SpO2:   100%   PainSc: 6  2   0-No pain   Eyes- EOMI Chest- CTA CV- rrr Abd- BS+, soft, non-tender.  Extr- no edema.   Labs- Glc 192 CO2 26 TSH 5.470 UA- few SE, Glc > 1000, no ketones.   A/P Metabolic Acidosis, uncontrolled DM  Will arrange with pharmacy for her to obtain her home insulin prior to d/c.   Would strongly encourage change to less expensive insulin as her outpt regiemen. Will  defer this to her PCP.   HTN  She needs to resume her ACE-I at home for renal-protective effects, again she needs to f/u  with her PCP.   Hypothyroidism  She needs to f/u with her PCP regarding this.   Campbell Riches, MD 07/10/2014, 10:25 AM

## 2014-07-10 NOTE — Progress Notes (Signed)
Last CBG 189. Discontinued insulin drip and D5 1/2NS per MD order. Begin NS @ 164ml/hr per order. Will continue to monitor.

## 2014-07-10 NOTE — Progress Notes (Signed)
Subjective:  Feeling well today. No longer feeling weak and lightheaded. No cp/sob/n/v. Anion gap closed, CBG normal now. Patient states she ran out of insulin and couldn't afford buying her lantus which costs about $200. Tried to call her pcp [facility] but they were closed on Friday. Follows up with Dr. Debbora Presto endocrin.  She states getting hypoglycemin on humolog. States has some itching around the urethra. No burning during urination, just afterwards.   Objective: Vital signs in last 24 hours: Filed Vitals:   07/09/14 1900 07/09/14 1915 07/09/14 2040 07/10/14 0500  BP: 129/55 114/56 135/78 139/76  Pulse: 87 90 84 80  Temp:   98.5 F (36.9 C) 98.6 F (37 C)  TempSrc:   Oral Oral  Resp: 17 16 18 18   Height:   5\' 9"  (1.753 m)   Weight:   126.145 kg (278 lb 1.6 oz)   SpO2: 97% 100% 100% 100%   Weight change:  No intake or output data in the 24 hours ending 07/10/14 1610 Vitals reviewed. General: resting in bed, NAD. Obese. HEENT: PERRL, EOMI, no scleral icterus Cardiac: RRR, no rubs, murmurs or gallops Pulm: clear to auscultation bilaterally, no wheezes, rales, or rhonchi Abd: soft, obese, nontender, nondistended, BS present Ext: warm and well perfused, no pedal edema. Has chronic venous statis skin changes on her shins. No lesions.  Neuro: alert and oriented X3, cranial nerves II-XII grossly intact, strength and sensation to light touch equal in bilateral upper and lower extremities  Lab Results: Basic Metabolic Panel:  Recent Labs Lab 07/09/14 2235 07/10/14 0520  NA 136* 132*  K 3.7 4.1  CL 99 95*  CO2 24 26  GLUCOSE 140* 192*  BUN 14 15  CREATININE 0.68 0.73  CALCIUM 8.8 8.3*   Liver Function Tests:  Recent Labs Lab 07/09/14 1600  AST 42*  ALT 36*  ALKPHOS 155*  BILITOT 0.3  PROT 7.4  ALBUMIN 3.5   No results for input(s): LIPASE, AMYLASE in the last 168 hours. No results for input(s): AMMONIA in the last 168 hours. CBC:  Recent  Labs Lab 07/09/14 1600 07/09/14 1614  WBC 11.5*  --   HGB 13.9 16.3*  HCT 43.6 48.0*  MCV 83.2  --   PLT 223  --    Cardiac Enzymes: No results for input(s): CKTOTAL, CKMB, CKMBINDEX, TROPONINI in the last 168 hours. BNP: No results for input(s): PROBNP in the last 168 hours. D-Dimer: No results for input(s): DDIMER in the last 168 hours. CBG:  Recent Labs Lab 07/09/14 2257 07/10/14 0024 07/10/14 0140 07/10/14 0246 07/10/14 0542 07/10/14 0815  GLUCAP 129* 180* 238* 189* 180* 201*   Hemoglobin A1C: No results for input(s): HGBA1C in the last 168 hours. Fasting Lipid Panel: No results for input(s): CHOL, HDL, LDLCALC, TRIG, CHOLHDL, LDLDIRECT in the last 168 hours. Thyroid Function Tests:  Recent Labs Lab 07/10/14 0520  TSH 5.470*   Coagulation: No results for input(s): LABPROT, INR in the last 168 hours. Anemia Panel: No results for input(s): VITAMINB12, FOLATE, FERRITIN, TIBC, IRON, RETICCTPCT in the last 168 hours. Urine Drug Screen: Drugs of Abuse     Component Value Date/Time   LABOPIA POSITIVE* 10/23/2010 0043   COCAINSCRNUR NONE DETECTED 10/23/2010 0043   LABBENZ POSITIVE* 10/23/2010 0043   AMPHETMU NONE DETECTED 10/23/2010 0043   THCU NONE DETECTED 10/23/2010 0043   LABBARB  10/23/2010 0043    NONE DETECTED        DRUG SCREEN FOR MEDICAL PURPOSES  ONLY.  IF CONFIRMATION IS NEEDED FOR ANY PURPOSE, NOTIFY LAB WITHIN 5 DAYS.        LOWEST DETECTABLE LIMITS FOR URINE DRUG SCREEN Drug Class       Cutoff (ng/mL) Amphetamine      1000 Barbiturate      200 Benzodiazepine   324 Tricyclics       401 Opiates          300 Cocaine          300 THC              50    Alcohol Level: No results for input(s): ETH in the last 168 hours. Urinalysis:  Recent Labs Lab 07/09/14 1635  COLORURINE YELLOW  LABSPEC 1.035*  PHURINE 5.0  GLUCOSEU >1000*  HGBUR NEGATIVE  BILIRUBINUR NEGATIVE  KETONESUR NEGATIVE  PROTEINUR NEGATIVE  UROBILINOGEN 0.2   NITRITE NEGATIVE  LEUKOCYTESUR NEGATIVE   Misc. Labs:  Micro Results: No results found for this or any previous visit (from the past 240 hour(s)). Studies/Results: No results found. Medications: I have reviewed the patient's current medications. Scheduled Meds: . amitriptyline  100 mg Oral QHS  . insulin aspart  0-5 Units Subcutaneous QHS  . insulin aspart  0-9 Units Subcutaneous TID WC  . insulin glargine  30 Units Subcutaneous QHS  . levothyroxine  137 mcg Oral QAC breakfast  . meloxicam  7.5 mg Oral QODAY  . morphine  60 mg Oral Q12H  . [START ON 07/11/2014] pneumococcal 23 valent vaccine  0.5 mL Intramuscular Tomorrow-1000   Continuous Infusions: . sodium chloride 125 mL/hr at 07/10/14 0833   PRN Meds:.clonazePAM Assessment/Plan: Principal Problem:   Hyperglycemia Active Problems:   Diabetes 1.5, managed as type 2   Anxiety state   Essential hypertension  58 yo female with DM II, anxiety, chronic pain here with Hyperglycemia >700 due to not being able to afford her insulin regimen.  Hyperglycemia HHS - 2/2 to not being able to afford her insulin -had anion gap 19 and bicarb 20 on presentation, received 2L NS, was on insulin drip. No ketones. Gap closed, transitioned to 30 lantus and SSI. Bg under control with this. Doing well now, tolerating diet and feeling significantly better.  - last hgba1c 5.8 several years ago. Was taking 55u QHS lantus and sliding scale humalog. Couldn't afford these as the lantus by itself costs here $200 monthly. Sees Dr. Debbora Presto Endocrinology. took insulin yesterday from her son in law as she ran out.  - will send her home today with her home regimen. Will give her short supply of the insulin and have her follow up closely with her primary care doctor and let them make changes to her meds to make sure she can afford it. We don't want to make sudden changes in her chronic diabetes management without knowing her well.  Hypothyroidism - s/p thyroid  surgery for cancer previously.  - TSH 5.470, is on synthyroid 162mcg. Will continue this and should manage meds outpatient.  Fungal vaginitis - likely 2/2 to glucosouria from recent HHS.  - will treat with diflucan.   Chronic pain: [name] has osteoarthritis as well as carpal tunnel syndrome, chronic low back pain, chronic foot pain. She says she takes ms contin 60 mg q12h for her pain and also alternates between ibuprofen and meloxicam 7.5 mg daily for her pain -continue ms contin 60 mg q12h -continue meloxicam 7.5 mg qodprn  Anxiety: [name] says she takes clonazepam 0.5 mg dailyprn  every day for her anxiety -continue clonazepam 0.5 mg dailyprn  DVT ppx: lovenox 40 mg Pe Ell daily Diet: carb mod   Dispo: Disposition is deferred at this time, awaiting improvement of current medical problems.  Anticipated discharge in approximately today  The patient does have a current PCP Annell Greening of [facility]) and does need an [facility] hospital follow-up appointment after discharge.  The patient does have transportation limitations that hinder transportation to clinic appointments.  .Services Needed at time of discharge: Y = Yes, Blank = No PT:   OT:   RN:   Equipment:   Other:     LOS: 1 day   Dellia Nims, MD 07/10/2014, 9:52 AM

## 2014-07-10 NOTE — Plan of Care (Signed)
Problem: Phase I Progression Outcomes Goal: Initial discharge plan identified Outcome: Completed/Met Date Met:  07/10/14 Goal: Hemodynamically stable Outcome: Completed/Met Date Met:  07/10/14  Problem: Phase II Progression Outcomes Goal: Progress activity as tolerated unless otherwise ordered Outcome: Completed/Met Date Met:  07/10/14 Goal: Discharge plan established Outcome: Completed/Met Date Met:  07/10/14 Goal: Vital signs remain stable Outcome: Completed/Met Date Met:  07/10/14 Goal: IV changed to normal saline lock Outcome: Completed/Met Date Met:  07/10/14 Goal: Other Phase II Outcomes/Goals Outcome: Not Applicable Date Met:  59/40/90  Problem: Phase III Progression Outcomes Goal: Pain controlled on oral analgesia Outcome: Completed/Met Date Met:  07/10/14 Goal: Activity at appropriate level-compared to baseline (UP IN CHAIR FOR HEMODIALYSIS)  Outcome: Completed/Met Date Met:  07/10/14 Goal: Voiding independently Outcome: Completed/Met Date Met:  07/10/14 Goal: IV/normal saline lock discontinued Outcome: Completed/Met Date Met:  07/10/14 Goal: Foley discontinued Outcome: Not Applicable Date Met:  50/25/61 Goal: Discharge plan remains appropriate-arrangements made Outcome: Completed/Met Date Met:  07/10/14 Goal: Other Phase III Outcomes/Goals Outcome: Not Applicable Date Met:  54/88/45  Problem: Discharge Progression Outcomes Goal: Discharge plan in place and appropriate Outcome: Completed/Met Date Met:  07/10/14 Goal: Pain controlled with appropriate interventions Outcome: Completed/Met Date Met:  07/10/14 Goal: Hemodynamically stable Outcome: Completed/Met Date Met:  73/34/48 Goal: Complications resolved/controlled Outcome: Completed/Met Date Met:  07/10/14 Goal: Tolerating diet Outcome: Completed/Met Date Met:  07/10/14 Goal: Activity appropriate for discharge plan Outcome: Completed/Met Date Met:  07/10/14 Goal: Other Discharge Outcomes/Goals Outcome: Not  Applicable Date Met:  30/15/99

## 2014-07-25 DIAGNOSIS — E780 Pure hypercholesterolemia: Secondary | ICD-10-CM | POA: Diagnosis not present

## 2014-07-25 DIAGNOSIS — G609 Hereditary and idiopathic neuropathy, unspecified: Secondary | ICD-10-CM | POA: Diagnosis not present

## 2014-07-25 DIAGNOSIS — I10 Essential (primary) hypertension: Secondary | ICD-10-CM | POA: Diagnosis not present

## 2014-07-25 DIAGNOSIS — E1165 Type 2 diabetes mellitus with hyperglycemia: Secondary | ICD-10-CM | POA: Diagnosis not present

## 2014-07-25 DIAGNOSIS — E890 Postprocedural hypothyroidism: Secondary | ICD-10-CM | POA: Diagnosis not present

## 2014-08-09 DIAGNOSIS — Z79891 Long term (current) use of opiate analgesic: Secondary | ICD-10-CM | POA: Diagnosis not present

## 2014-08-09 DIAGNOSIS — Z5181 Encounter for therapeutic drug level monitoring: Secondary | ICD-10-CM | POA: Diagnosis not present

## 2014-08-26 ENCOUNTER — Emergency Department (HOSPITAL_COMMUNITY): Payer: Medicare Other

## 2014-08-26 ENCOUNTER — Encounter (HOSPITAL_COMMUNITY): Payer: Self-pay | Admitting: Emergency Medicine

## 2014-08-26 ENCOUNTER — Inpatient Hospital Stay (HOSPITAL_COMMUNITY)
Admission: EM | Admit: 2014-08-26 | Discharge: 2014-08-29 | DRG: 194 | Disposition: A | Discharge status: Home / Self Care | Payer: Medicare Other | Attending: Internal Medicine | Admitting: Internal Medicine

## 2014-08-26 DIAGNOSIS — J45909 Unspecified asthma, uncomplicated: Secondary | ICD-10-CM | POA: Diagnosis present

## 2014-08-26 DIAGNOSIS — J449 Chronic obstructive pulmonary disease, unspecified: Secondary | ICD-10-CM | POA: Diagnosis present

## 2014-08-26 DIAGNOSIS — Y95 Nosocomial condition: Secondary | ICD-10-CM | POA: Diagnosis present

## 2014-08-26 DIAGNOSIS — R05 Cough: Secondary | ICD-10-CM

## 2014-08-26 DIAGNOSIS — R609 Edema, unspecified: Secondary | ICD-10-CM

## 2014-08-26 DIAGNOSIS — M4806 Spinal stenosis, lumbar region: Secondary | ICD-10-CM | POA: Diagnosis present

## 2014-08-26 DIAGNOSIS — Z8673 Personal history of transient ischemic attack (TIA), and cerebral infarction without residual deficits: Secondary | ICD-10-CM | POA: Diagnosis not present

## 2014-08-26 DIAGNOSIS — E1165 Type 2 diabetes mellitus with hyperglycemia: Secondary | ICD-10-CM | POA: Diagnosis not present

## 2014-08-26 DIAGNOSIS — J189 Pneumonia, unspecified organism: Principal | ICD-10-CM | POA: Diagnosis present

## 2014-08-26 DIAGNOSIS — M419 Scoliosis, unspecified: Secondary | ICD-10-CM | POA: Diagnosis present

## 2014-08-26 DIAGNOSIS — G894 Chronic pain syndrome: Secondary | ICD-10-CM | POA: Diagnosis present

## 2014-08-26 DIAGNOSIS — Z6841 Body Mass Index (BMI) 40.0 and over, adult: Secondary | ICD-10-CM | POA: Diagnosis not present

## 2014-08-26 DIAGNOSIS — Z87891 Personal history of nicotine dependence: Secondary | ICD-10-CM | POA: Diagnosis not present

## 2014-08-26 DIAGNOSIS — E039 Hypothyroidism, unspecified: Secondary | ICD-10-CM | POA: Diagnosis present

## 2014-08-26 DIAGNOSIS — I10 Essential (primary) hypertension: Secondary | ICD-10-CM

## 2014-08-26 DIAGNOSIS — F329 Major depressive disorder, single episode, unspecified: Secondary | ICD-10-CM | POA: Diagnosis present

## 2014-08-26 DIAGNOSIS — E785 Hyperlipidemia, unspecified: Secondary | ICD-10-CM | POA: Diagnosis present

## 2014-08-26 DIAGNOSIS — J438 Other emphysema: Secondary | ICD-10-CM | POA: Diagnosis not present

## 2014-08-26 DIAGNOSIS — E876 Hypokalemia: Secondary | ICD-10-CM | POA: Diagnosis present

## 2014-08-26 DIAGNOSIS — K760 Fatty (change of) liver, not elsewhere classified: Secondary | ICD-10-CM | POA: Diagnosis present

## 2014-08-26 DIAGNOSIS — E1159 Type 2 diabetes mellitus with other circulatory complications: Secondary | ICD-10-CM | POA: Diagnosis present

## 2014-08-26 DIAGNOSIS — F410 Panic disorder [episodic paroxysmal anxiety] without agoraphobia: Secondary | ICD-10-CM | POA: Diagnosis present

## 2014-08-26 DIAGNOSIS — E139 Other specified diabetes mellitus without complications: Secondary | ICD-10-CM | POA: Diagnosis not present

## 2014-08-26 DIAGNOSIS — J984 Other disorders of lung: Secondary | ICD-10-CM | POA: Diagnosis not present

## 2014-08-26 DIAGNOSIS — R600 Localized edema: Secondary | ICD-10-CM

## 2014-08-26 DIAGNOSIS — Z79891 Long term (current) use of opiate analgesic: Secondary | ICD-10-CM | POA: Diagnosis not present

## 2014-08-26 DIAGNOSIS — E890 Postprocedural hypothyroidism: Secondary | ICD-10-CM | POA: Diagnosis present

## 2014-08-26 DIAGNOSIS — I517 Cardiomegaly: Secondary | ICD-10-CM | POA: Diagnosis not present

## 2014-08-26 DIAGNOSIS — R059 Cough, unspecified: Secondary | ICD-10-CM

## 2014-08-26 DIAGNOSIS — R911 Solitary pulmonary nodule: Secondary | ICD-10-CM

## 2014-08-26 DIAGNOSIS — R0602 Shortness of breath: Secondary | ICD-10-CM

## 2014-08-26 DIAGNOSIS — G4700 Insomnia, unspecified: Secondary | ICD-10-CM | POA: Diagnosis present

## 2014-08-26 HISTORY — DX: Pneumonia, unspecified organism: J18.9

## 2014-08-26 HISTORY — DX: Low back pain, unspecified: M54.50

## 2014-08-26 HISTORY — DX: Malignant neoplasm of cervix uteri, unspecified: C53.9

## 2014-08-26 HISTORY — DX: Type 2 diabetes mellitus without complications: E11.9

## 2014-08-26 HISTORY — DX: Unspecified osteoarthritis, unspecified site: M19.90

## 2014-08-26 HISTORY — DX: Fibromyalgia: M79.7

## 2014-08-26 HISTORY — DX: Low back pain: M54.5

## 2014-08-26 HISTORY — DX: Reserved for inherently not codable concepts without codable children: IMO0001

## 2014-08-26 HISTORY — DX: Other chronic pain: G89.29

## 2014-08-26 HISTORY — DX: Headache, unspecified: R51.9

## 2014-08-26 HISTORY — DX: Long term (current) use of insulin: Z79.4

## 2014-08-26 HISTORY — DX: Headache: R51

## 2014-08-26 HISTORY — DX: Essential (primary) hypertension: I10

## 2014-08-26 HISTORY — DX: Hypothyroidism, unspecified: E03.9

## 2014-08-26 LAB — CBC WITH DIFFERENTIAL/PLATELET
Basophils Absolute: 0 10*3/uL (ref 0.0–0.1)
Basophils Relative: 0 % (ref 0–1)
EOS PCT: 4 % (ref 0–5)
Eosinophils Absolute: 0.3 10*3/uL (ref 0.0–0.7)
HCT: 42.6 % (ref 36.0–46.0)
HEMOGLOBIN: 13.8 g/dL (ref 12.0–15.0)
LYMPHS ABS: 1.6 10*3/uL (ref 0.7–4.0)
Lymphocytes Relative: 22 % (ref 12–46)
MCH: 26.1 pg (ref 26.0–34.0)
MCHC: 32.4 g/dL (ref 30.0–36.0)
MCV: 80.5 fL (ref 78.0–100.0)
MONOS PCT: 6 % (ref 3–12)
Monocytes Absolute: 0.4 10*3/uL (ref 0.1–1.0)
NEUTROS ABS: 5.1 10*3/uL (ref 1.7–7.7)
NEUTROS PCT: 68 % (ref 43–77)
PLATELETS: 199 10*3/uL (ref 150–400)
RBC: 5.29 MIL/uL — ABNORMAL HIGH (ref 3.87–5.11)
RDW: 13.5 % (ref 11.5–15.5)
WBC: 7.5 10*3/uL (ref 4.0–10.5)

## 2014-08-26 LAB — BASIC METABOLIC PANEL
Anion gap: 13 (ref 5–15)
BUN: 8 mg/dL (ref 6–23)
CHLORIDE: 96 meq/L (ref 96–112)
CO2: 25 mmol/L (ref 19–32)
CREATININE: 0.75 mg/dL (ref 0.50–1.10)
Calcium: 9 mg/dL (ref 8.4–10.5)
GFR calc Af Amer: >90 mL/min (ref >90)
GFR calc non Af Amer: >90 mL/min (ref >90)
Glucose, Bld: 212 mg/dL — ABNORMAL HIGH (ref 70–99)
POTASSIUM: 3.3 mmol/L — AB (ref 3.5–5.1)
Sodium: 134 mmol/L — ABNORMAL LOW (ref 135–145)

## 2014-08-26 LAB — TROPONIN I: Troponin I: <0.03 ng/mL (ref <0.031)

## 2014-08-26 LAB — GLUCOSE, CAPILLARY: Glucose-Capillary: 264 mg/dL — ABNORMAL HIGH (ref 70–99)

## 2014-08-26 LAB — CBC
HCT: 43 % (ref 36.0–46.0)
HEMOGLOBIN: 14.1 g/dL (ref 12.0–15.0)
MCH: 26.8 pg (ref 26.0–34.0)
MCHC: 32.8 g/dL (ref 30.0–36.0)
MCV: 81.6 fL (ref 78.0–100.0)
Platelets: 227 10*3/uL (ref 150–400)
RBC: 5.27 MIL/uL — ABNORMAL HIGH (ref 3.87–5.11)
RDW: 13.4 % (ref 11.5–15.5)
WBC: 9.5 10*3/uL (ref 4.0–10.5)

## 2014-08-26 LAB — CREATININE, SERUM
Creatinine, Ser: 0.82 mg/dL (ref 0.50–1.10)
GFR calc non Af Amer: 77 mL/min — ABNORMAL LOW (ref >90)
GFR, EST AFRICAN AMERICAN: 90 mL/min — AB (ref >90)

## 2014-08-26 LAB — STREP PNEUMONIAE URINARY ANTIGEN: Strep Pneumo Urinary Antigen: NEGATIVE

## 2014-08-26 LAB — BRAIN NATRIURETIC PEPTIDE: B NATRIURETIC PEPTIDE 5: 36 pg/mL (ref 0.0–100.0)

## 2014-08-26 MED ORDER — AMITRIPTYLINE HCL 100 MG PO TABS
100.0000 mg | ORAL_TABLET | Freq: Every day | ORAL | Status: DC
  Administered 2014-08-26 – 2014-08-28 (×3): 100 mg via ORAL
  Filled 2014-08-26 (×4): qty 1

## 2014-08-26 MED ORDER — DIPHENHYDRAMINE HCL 25 MG PO CAPS
25.0000 mg | ORAL_CAPSULE | Freq: Once | ORAL | Status: AC
  Administered 2014-08-26: 25 mg via ORAL
  Filled 2014-08-26: qty 1

## 2014-08-26 MED ORDER — INSULIN ASPART 100 UNIT/ML ~~LOC~~ SOLN
0.0000 [IU] | Freq: Three times a day (TID) | SUBCUTANEOUS | Status: DC
Start: 2014-08-27 — End: 2014-08-29
  Administered 2014-08-27: 3 [IU] via SUBCUTANEOUS
  Administered 2014-08-27: 5 [IU] via SUBCUTANEOUS
  Administered 2014-08-27: 2 [IU] via SUBCUTANEOUS
  Administered 2014-08-28 (×3): 3 [IU] via SUBCUTANEOUS
  Administered 2014-08-29: 5 [IU] via SUBCUTANEOUS
  Administered 2014-08-29: 8 [IU] via SUBCUTANEOUS

## 2014-08-26 MED ORDER — POTASSIUM CHLORIDE CRYS ER 20 MEQ PO TBCR
40.0000 meq | EXTENDED_RELEASE_TABLET | Freq: Once | ORAL | Status: AC
  Administered 2014-08-26: 40 meq via ORAL
  Filled 2014-08-26: qty 2

## 2014-08-26 MED ORDER — AMLODIPINE BESYLATE 5 MG PO TABS
5.0000 mg | ORAL_TABLET | Freq: Every day | ORAL | Status: DC
  Administered 2014-08-26 – 2014-08-29 (×3): 5 mg via ORAL
  Filled 2014-08-26 (×4): qty 1

## 2014-08-26 MED ORDER — INSULIN GLARGINE 100 UNIT/ML ~~LOC~~ SOLN
80.0000 [IU] | Freq: Every day | SUBCUTANEOUS | Status: DC
  Administered 2014-08-27 – 2014-08-29 (×3): 80 [IU] via SUBCUTANEOUS
  Filled 2014-08-26 (×4): qty 0.8

## 2014-08-26 MED ORDER — VANCOMYCIN HCL 10 G IV SOLR
1500.0000 mg | Freq: Once | INTRAVENOUS | Status: AC
  Administered 2014-08-26: 1500 mg via INTRAVENOUS
  Filled 2014-08-26: qty 1500

## 2014-08-26 MED ORDER — MELOXICAM 7.5 MG PO TABS
7.5000 mg | ORAL_TABLET | Freq: Two times a day (BID) | ORAL | Status: DC
  Administered 2014-08-26 – 2014-08-29 (×6): 7.5 mg via ORAL
  Filled 2014-08-26 (×7): qty 1

## 2014-08-26 MED ORDER — VANCOMYCIN HCL IN DEXTROSE 1-5 GM/200ML-% IV SOLN
1000.0000 mg | Freq: Once | INTRAVENOUS | Status: AC
Start: 2014-08-26 — End: 2014-08-26
  Administered 2014-08-26: 1000 mg via INTRAVENOUS
  Filled 2014-08-26: qty 200

## 2014-08-26 MED ORDER — ALBUTEROL SULFATE (2.5 MG/3ML) 0.083% IN NEBU
5.0000 mg | INHALATION_SOLUTION | Freq: Once | RESPIRATORY_TRACT | Status: AC
  Administered 2014-08-26: 5 mg via RESPIRATORY_TRACT
  Filled 2014-08-26: qty 6

## 2014-08-26 MED ORDER — CLONAZEPAM 0.5 MG PO TABS
0.5000 mg | ORAL_TABLET | Freq: Three times a day (TID) | ORAL | Status: DC | PRN
  Administered 2014-08-26 – 2014-08-29 (×3): 0.5 mg via ORAL
  Filled 2014-08-26 (×4): qty 1

## 2014-08-26 MED ORDER — HYDROCHLOROTHIAZIDE 12.5 MG PO CAPS: 12.5000 mg | ORAL_CAPSULE | Freq: Every day | ORAL | Status: DC

## 2014-08-26 MED ORDER — IOHEXOL 350 MG/ML SOLN
80.0000 mL | Freq: Once | INTRAVENOUS | Status: AC | PRN
  Administered 2014-08-26: 80 mL via INTRAVENOUS

## 2014-08-26 MED ORDER — AMLODIPINE BESYLATE 5 MG PO TABS: 5.0000 mg | ORAL_TABLET | Freq: Every day | ORAL | Status: DC

## 2014-08-26 MED ORDER — ALBUTEROL SULFATE (2.5 MG/3ML) 0.083% IN NEBU: 2.5000 mg | INHALATION_SOLUTION | RESPIRATORY_TRACT | Status: DC | PRN

## 2014-08-26 MED ORDER — INSULIN GLARGINE 100 UNIT/ML ~~LOC~~ SOLN: 70.0000 [IU] | Freq: Every day | SUBCUTANEOUS | Status: DC

## 2014-08-26 MED ORDER — LORAZEPAM 2 MG/ML IJ SOLN
1.0000 mg | Freq: Once | INTRAMUSCULAR | Status: AC
  Administered 2014-08-26: 1 mg via INTRAVENOUS
  Filled 2014-08-26: qty 1

## 2014-08-26 MED ORDER — INSULIN ASPART 100 UNIT/ML ~~LOC~~ SOLN
0.0000 [IU] | Freq: Every day | SUBCUTANEOUS | Status: DC
  Administered 2014-08-26: 3 [IU] via SUBCUTANEOUS

## 2014-08-26 MED ORDER — HYDROCHLOROTHIAZIDE 12.5 MG PO CAPS
12.5000 mg | ORAL_CAPSULE | Freq: Every day | ORAL | Status: DC
  Administered 2014-08-27 – 2014-08-29 (×3): 12.5 mg via ORAL
  Filled 2014-08-26 (×4): qty 1

## 2014-08-26 MED ORDER — ACETAMINOPHEN 325 MG PO TABS
650.0000 mg | ORAL_TABLET | Freq: Once | ORAL | Status: AC
  Administered 2014-08-26: 650 mg via ORAL
  Filled 2014-08-26: qty 2

## 2014-08-26 MED ORDER — LISINOPRIL 10 MG PO TABS
10.0000 mg | ORAL_TABLET | Freq: Every day | ORAL | Status: DC
  Administered 2014-08-26 – 2014-08-29 (×3): 10 mg via ORAL
  Filled 2014-08-26 (×4): qty 1

## 2014-08-26 MED ORDER — ENOXAPARIN SODIUM 40 MG/0.4ML ~~LOC~~ SOLN
40.0000 mg | Freq: Every day | SUBCUTANEOUS | Status: DC
  Administered 2014-08-26 – 2014-08-28 (×3): 40 mg via SUBCUTANEOUS
  Filled 2014-08-26 (×4): qty 0.4

## 2014-08-26 MED ORDER — VANCOMYCIN HCL 10 G IV SOLR
1250.0000 mg | Freq: Two times a day (BID) | INTRAVENOUS | Status: DC
  Administered 2014-08-27 – 2014-08-28 (×4): 1250 mg via INTRAVENOUS
  Filled 2014-08-26 (×6): qty 1250

## 2014-08-26 MED ORDER — CEFEPIME HCL 1 G IJ SOLR
1.0000 g | Freq: Three times a day (TID) | INTRAMUSCULAR | Status: DC
  Administered 2014-08-27 – 2014-08-29 (×8): 1 g via INTRAVENOUS
  Filled 2014-08-26 (×11): qty 1

## 2014-08-26 MED ORDER — LEVOTHYROXINE SODIUM 150 MCG PO TABS
150.0000 ug | ORAL_TABLET | Freq: Every day | ORAL | Status: DC
  Administered 2014-08-27 – 2014-08-29 (×3): 150 ug via ORAL
  Filled 2014-08-26 (×4): qty 1

## 2014-08-26 MED ORDER — PIPERACILLIN-TAZOBACTAM 3.375 G IVPB 30 MIN
3.3750 g | Freq: Once | INTRAVENOUS | Status: AC
  Administered 2014-08-26: 3.375 g via INTRAVENOUS
  Filled 2014-08-26: qty 50

## 2014-08-26 MED ORDER — MORPHINE SULFATE ER 30 MG PO TBCR
60.0000 mg | EXTENDED_RELEASE_TABLET | Freq: Two times a day (BID) | ORAL | Status: DC
Start: 2014-08-26 — End: 2014-08-29
  Administered 2014-08-26 – 2014-08-29 (×6): 60 mg via ORAL
  Filled 2014-08-26 (×6): qty 2

## 2014-08-26 MED ORDER — GUAIFENESIN ER 600 MG PO TB12
600.0000 mg | ORAL_TABLET | Freq: Two times a day (BID) | ORAL | Status: DC
  Administered 2014-08-26 – 2014-08-27 (×3): 600 mg via ORAL
  Filled 2014-08-26 (×5): qty 1

## 2014-08-26 NOTE — ED Notes (Signed)
Pt c/o SOB and cough x 3 days; pt sts feels similar to when had PNA in past

## 2014-08-26 NOTE — ED Notes (Signed)
MD at bedside. 

## 2014-08-26 NOTE — Progress Notes (Signed)
Mary Acosta [id number] Admitted to 7B93: 08/26/2014 8:42 PM Attending Provider: Debbe Odea, MD    Mary Acosta is a 59 y.o. female patient admitted from ED awake, alert  & orientated  X 3,  Full Code, VSS - Blood pressure 168/91, pulse 89, temperature 98.8 F (37.1 C), temperature source Oral, resp. rate 17, height 5\' 9"  (1.753 m), weight 126.055 kg (277 lb 14.4 oz), SpO2 97 %.RA, no c/o shortness of breath, no c/o chest pain, no distress noted.    IV site WDL:  with a transparent dsg that's clean dry and intact.  Allergies:   Allergies  Allergen Reactions  . Losartan Other (See Comments)    Myalgias and muscle cramping  . Sulfonamide Derivatives Nausea Only     Past Medical History  Diagnosis Date  . Anxiety     with panic attacks  . Insomnia   . COPD (chronic obstructive pulmonary disease)   . Osteoarthritis     with severe disease in knee  . Polymyalgia rheumatica   . Fatty liver disease, nonalcoholic   . Scoliosis   . Asthma   . Depression   . History of TIA (transient ischemic attack) 11-01-2010--  NO RESIDUAL  . Hyperlipidemia   . DJD (degenerative joint disease)   . Lumbar stenosis   . Chronic pain syndrome     [facility]  . History of hypertension S/P GASTRIC BANDING--- NO ISSUE SINCE WT. LOSS  . OSA (obstructive sleep apnea) NO CPAP SINCE WT LOSS  . Seasonal allergies   . Chronic narcotic use   . Frequency of urination   . Urgency of urination   . Nocturia   . Vaginal pain S/P SLING  FEB 2012  . Diabetes mellitus type II STATES LAST A1C 6.4  . Cancer     History:  obtained from patient  Pt orientation to unit, room and routine. Information packet given to patient and safety video refused.  Admission INP armband ID verified with patient, and in place. SR up x 2, fall risk assessment complete with Patient verbalizing understanding of risks associated with falls. Pt verbalizes an understanding of how to use the call bell and  to call for help before getting out of bed.  Skin, clean-dry- intact without evidence of bruising, or skin tears.   No evidence of skin break down noted on exam.  Will cont to monitor and assist as needed.  [name], RN 08/26/2014 8:42 PM

## 2014-08-26 NOTE — ED Notes (Signed)
IV site assessed. No apparent signs of infiltration. Able to get good blood return from IV site and able to flush IV site easily. Iv site redressed with new tape. Pt still C/o of pain at IV site, will continue to monitor.

## 2014-08-26 NOTE — ED Notes (Signed)
Dr. Pickering at the bedside.  

## 2014-08-26 NOTE — ED Notes (Signed)
retaped IV as pt is stating they way it's positioned hurts. Good position and good blood return.

## 2014-08-26 NOTE — Progress Notes (Signed)
ANTIBIOTIC CONSULT NOTE - INITIAL  Pharmacy Consult for vancomycin Indication: pneumonia  Allergies  Allergen Reactions  . Losartan Other (See Comments)    Myalgias and muscle cramping  . Sulfonamide Derivatives Nausea Only    Patient Measurements:  Wt: 126.1kg  Vital Signs: Temp: 98.8 F (37.1 C) (01/01 1455) Temp Source: Oral (01/01 1455) BP: 168/91 mmHg (01/01 1938) Pulse Rate: 89 (01/01 1930) Intake/Output from previous day:   Intake/Output from this shift:    Labs:  Recent Labs  08/26/14 1518  WBC 7.5  HGB 13.8  PLT 199  CREATININE 0.75   CrCl cannot be calculated (Unknown ideal weight.). No results for input(s): VANCOTROUGH, VANCOPEAK, VANCORANDOM, GENTTROUGH, GENTPEAK, GENTRANDOM, TOBRATROUGH, TOBRAPEAK, TOBRARND, AMIKACINPEAK, AMIKACINTROU, AMIKACIN in the last 72 hours.   Calculated Normalized CrCl~ 26ml/min  Microbiology: No results found for this or any previous visit (from the past 720 hour(s)).  Medical History: Past Medical History  Diagnosis Date  . Anxiety     with panic attacks  . Insomnia   . COPD (chronic obstructive pulmonary disease)   . Osteoarthritis     with severe disease in knee  . Polymyalgia rheumatica   . Fatty liver disease, nonalcoholic   . Scoliosis   . Asthma   . Depression   . History of TIA (transient ischemic attack) 11-01-2010--  NO RESIDUAL  . Hyperlipidemia   . DJD (degenerative joint disease)   . Lumbar stenosis   . Chronic pain syndrome     [facility]  . History of hypertension S/P GASTRIC BANDING--- NO ISSUE SINCE WT. LOSS  . OSA (obstructive sleep apnea) NO CPAP SINCE WT LOSS  . Seasonal allergies   . Chronic narcotic use   . Frequency of urination   . Urgency of urination   . Nocturia   . Vaginal pain S/P SLING  FEB 2012  . Diabetes mellitus type II STATES LAST A1C 6.4  . Cancer     Medications:  Scheduled:  . amitriptyline  100 mg Oral QHS  . amLODipine  5 mg Oral Daily  .  enoxaparin (LOVENOX) injection  40 mg Subcutaneous Q24H  . guaiFENesin  600 mg Oral BID  . hydrochlorothiazide  12.5 mg Oral Daily  . [START ON 08/27/2014] insulin aspart  0-15 Units Subcutaneous TID WC  . insulin aspart  0-5 Units Subcutaneous QHS  . [START ON 08/27/2014] insulin glargine  80 Units Subcutaneous Daily  . [START ON 08/27/2014] levothyroxine  150 mcg Oral QAC breakfast  . lisinopril  10 mg Oral Daily  . meloxicam  7.5 mg Oral BID  . morphine  60 mg Oral Q12H  . potassium chloride  40 mEq Oral Once   Assessment: 59 yo F admitted with SOB and productive cough x 3 days.  Pharmacy consulted to begin vancomycin for possible pneumonia. WBC 7.5, afebrile, SCr 0.75, normalized CrCl~ 36ml/min. Patient received vancomycin 1g x 1.   Goal of Therapy:  Vancomycin trough level 15-20 mcg/ml  Plan:  Vancomycin 1500mg  IV x 1 (total LD = 2500mg ), then Vancomycin 1250mg  IV q12hr Monitor cultures, renal function, and clinical progress VT at steady state if clinically warranted  Thank you for allowing pharmacy to be part of this patient's care team  [place], Pharm.D Clinical Pharmacy Resident Pager: [phone] 08/26/2014 .8:11 PM

## 2014-08-26 NOTE — ED Provider Notes (Signed)
CSN: [id number]     Arrival date & time 08/26/14  1449 History   First MD Initiated Contact with Patient 08/26/14 1459     Chief Complaint  Patient presents with  . Shortness of Breath     (Consider location/radiation/quality/duration/timing/severity/associated sxs/prior Treatment) Patient is a 59 y.o. female presenting with shortness of breath. The history is provided by the patient.  Shortness of Breath Severity:  Moderate Associated symptoms: chest pain, cough and wheezing   Associated symptoms: no abdominal pain, no headaches, no rash and no vomiting    patient presents with shortness of breath and cough over the last 4-5 days. States there's been no production but feels like there is something needs to come on. No fevers. She's had decreased appetite. She's had chest tightness. She states she's had the feeling or she stops breathing at night. She states she is nervous to go to sleep. She states that she's been evaluated in the past and does not have sleep apnea. No swelling or legs. No change her medications. She has a history of asthma and COPD. States she thought it would've gotten better when she stopped smoking 4 years ago but it really did not. No dysuria.  Past Medical History  Diagnosis Date  . Anxiety     with panic attacks  . Insomnia   . COPD (chronic obstructive pulmonary disease)   . Polymyalgia rheumatica   . Fatty liver disease, nonalcoholic   . Scoliosis   . Asthma   . Depression   . History of TIA (transient ischemic attack) 11-01-2010    NO RESIDUAL  . Hyperlipidemia   . Lumbar stenosis   . Chronic pain syndrome     [facility]  . Seasonal allergies   . Chronic narcotic use   . Frequency of urination   . Urgency of urination   . Nocturia   . Vaginal pain S/P SLING  FEB 2012  . Hypertension   . OSA (obstructive sleep apnea)     NO CPAP SINCE WT LOSS  . Hypothyroidism   . IDDM (insulin dependent diabetes mellitus)   . Daily headache   .  Osteoarthritis     with severe disease in knee  . DJD (degenerative joint disease)   . Arthritis     "back; feet; hands; shoulders" (08/26/2014)  . Fibromyalgia   . Chronic lower back pain   . Thyroid cancer   . Cervical cancer   . Pneumonia "several times"  . HCAP (healthcare-associated pneumonia) 08/26/2014   Past Surgical History  Procedure Laterality Date  . Appendectomy  1982  . Tubal ligation  1983  . Hysteroscopy w/d&c  08-19-2007; 05-16-2003    2008 PMB; D&C w/ CRYOABLATION & LEEP FOR ABNORMAL PAP SMEAR  . Laparoscopic gastric banding  03/01/2006    TRUNCAL VAGOTOMY/ PLACEMENT OF VG BAND  . Knee arthroscopy w/ debridement Left 03/29/2006    INTERNAL DERANGEMENT/ SEVERE DJD/ MENISCUS TEARS  . Transvaginal subureteral tape/ sling  09-28-2010    MIXED URINARY INCONTINENCE  . Revision total knee arthroplasty Left 08-29-2008; 05/2009  . Total knee arthroplasty Left 01-23-2007    SEVERE DJD  . Total thyroidectomy  11-22-2005    BILATERAL THYROID NODULES-- PAPILLARY CARCINOMA (0.5CM)/ ADENOMATOID NODULES  . Breast lumpectomy Left 02-28-2005    ATYPICAL DUCTAL HYPERPLASIA  . Cardiac catheterization  09-04-2004    NORMAL CORONARY ANATOMY/ NORMAL LVF/ EF 60%  . Laparoscopic cholecystectomy  06-10-2002  . Tonsillectomy  1969  .  Transthoracic echocardiogram  12-27-2010    LVSF NORMAL / EF 85-27%/ GRADE I DIASTOLIC DYSFUNCTION/ MILD MITRAL REGURG. / MILDLY DILATED LEFT ATRIUM/ MILDY INCREASED SYSTOLIC PRESSURE OF PULMONARY ARTERIES  . Cardiovascular stress test  12-27-2010  DR Martinique    ABNORMAL NUCLEAR STUDY W/ /MILD INFERIOR ISCHEMIA/ EF 69%/  CT HEART ANGIOGRAM ;  NO ACUTE FINDINGS  . Cystoscopy  05/18/2012    Procedure: CYSTOSCOPY;  Surgeon: Reece Packer, MD;  Location: [facility];  Service: Urology;  Laterality: N/A;  examination under anethesia  . Cryoablation  05/16/2003    w/LEEP FOR ABNORMAL PAP SMEAR   Family History  Problem Relation Age of Onset  .  Colon cancer Maternal Uncle     x 2  . Breast cancer Other     great aunts x 5  . Diabetes Mother   . Heart disease Father   . Heart disease Mother    History  Substance Use Topics  . Smoking status: Former Smoker -- 1.00 packs/day for 39 years    Types: Cigarettes    Quit date: 10/22/2010  . Smokeless tobacco: Never Used  . Alcohol Use: No   OB History    No data available     Review of Systems  Constitutional: Positive for appetite change. Negative for activity change.  Eyes: Negative for pain.  Respiratory: Positive for cough, shortness of breath and wheezing. Negative for chest tightness.   Cardiovascular: Positive for chest pain. Negative for leg swelling.  Gastrointestinal: Negative for nausea, vomiting, abdominal pain and diarrhea.  Genitourinary: Negative for flank pain.  Musculoskeletal: Negative for back pain and neck stiffness.  Skin: Negative for rash.  Neurological: Negative for weakness, numbness and headaches.  Psychiatric/Behavioral: Negative for behavioral problems.      Allergies  Losartan and Sulfonamide derivatives  Home Medications   Prior to Admission medications   Medication Sig Start Date End Date Taking? Authorizing Provider  acetaminophen (TYLENOL) 500 MG tablet Take 500 mg by mouth every 4 (four) hours as needed for moderate pain.   Yes Historical Provider, MD  amitriptyline (ELAVIL) 25 MG tablet Take 100 mg by mouth at bedtime.   Yes Historical Provider, MD  cetirizine (ZYRTEC) 10 MG tablet Take 10 mg by mouth daily as needed for allergies.    Yes Historical Provider, MD  clonazePAM (KLONOPIN) 0.5 MG tablet Take 0.5 mg by mouth daily as needed for anxiety.   Yes Historical Provider, MD  ibuprofen (ADVIL,MOTRIN) 200 MG tablet Take 200 mg by mouth every 6 (six) hours as needed. For knee pain   Yes Historical Provider, MD  insulin glargine (LANTUS) 100 UNIT/ML injection Inject 0.3 mLs (30 Units total) into the skin daily. 30 UNITS QD Patient  taking differently: Inject 80 Units into the skin daily.  07/10/14  Yes Tasrif Ahmed, MD  levothyroxine (SYNTHROID, LEVOTHROID) 150 MCG tablet Take 150 mcg by mouth daily before breakfast.   Yes Historical Provider, MD  meloxicam (MOBIC) 7.5 MG tablet Take 7.5 mg by mouth 2 (two) times daily.    Yes Historical Provider, MD  morphine (MS CONTIN) 60 MG 12 hr tablet Take 60 mg by mouth every 12 (twelve) hours.   Yes Historical Provider, MD  potassium chloride SA (K-DUR,KLOR-CON) 20 MEQ tablet Take 20 mEq by mouth as needed (for cramping).  05/30/14  Yes Historical Provider, MD  ENABLEX 7.5 MG 24 hr tablet Take 7.5 mg by mouth daily.  03/06/11   Historical Provider, MD  insulin  lispro (HUMALOG PEN) 100 UNIT/ML SOPN Inject 10 Units into the skin 3 (three) times daily with meals. Patient not taking: Reported on 08/26/2014 05/11/13   Harden Mo, MD  insulin lispro (HUMALOG) 100 UNIT/ML injection Inject 0.1 mLs (10 Units total) into the skin 3 (three) times daily before meals. Patient not taking: Reported on 08/26/2014 07/10/14   Dellia Nims, MD  levothyroxine (SYNTHROID, LEVOTHROID) 137 MCG tablet Take 137 mcg by mouth daily before breakfast.    Historical Provider, MD   BP 152/92 mmHg  Pulse 80  Temp(Src) 97.7 F (36.5 C) (Oral)  Resp 17  Ht 5\' 9"  (1.753 m)  Wt 279 lb 12.2 oz (126.9 kg)  BMI 41.30 kg/m2  SpO2 94% Physical Exam  Constitutional: She is oriented to person, place, and time. She appears well-developed and well-nourished.  Patient is obese  HENT:  Head: Normocephalic and atraumatic.  Eyes: EOM are normal. Pupils are equal, round, and reactive to light.  Neck: Normal range of motion. Neck supple.  Cardiovascular: Normal rate, regular rhythm and normal heart sounds.   No murmur heard. Pulmonary/Chest: Effort normal. No respiratory distress. She has wheezes. She has no rales.  Diffuse wheezes with few scattered rales.  Abdominal: Soft. Bowel sounds are normal. She exhibits no  distension. There is no tenderness. There is no rebound and no guarding.  Musculoskeletal: Normal range of motion. She exhibits no tenderness.  Neurological: She is alert and oriented to person, place, and time. No cranial nerve deficit.  Skin: Skin is warm and dry.  Psychiatric: She has a normal mood and affect. Her speech is normal.  Nursing note and vitals reviewed.   ED Course  Procedures (including critical care time) Labs Review Labs Reviewed  BASIC METABOLIC PANEL - Abnormal; Notable for the following:    Sodium 134 (*)    Potassium 3.3 (*)    Glucose, Bld 212 (*)    All other components within normal limits  CBC WITH DIFFERENTIAL - Abnormal; Notable for the following:    RBC 5.29 (*)    All other components within normal limits  CBC - Abnormal; Notable for the following:    RBC 5.27 (*)    All other components within normal limits  CREATININE, SERUM - Abnormal; Notable for the following:    GFR calc non Af Amer 77 (*)    GFR calc Af Amer 90 (*)    All other components within normal limits  GLUCOSE, CAPILLARY - Abnormal; Notable for the following:    Glucose-Capillary 264 (*)    All other components within normal limits  GLUCOSE, CAPILLARY - Abnormal; Notable for the following:    Glucose-Capillary 146 (*)    All other components within normal limits  GLUCOSE, CAPILLARY - Abnormal; Notable for the following:    Glucose-Capillary 167 (*)    All other components within normal limits  BASIC METABOLIC PANEL - Abnormal; Notable for the following:    CO2 34 (*)    Glucose, Bld 144 (*)    GFR calc non Af Amer 65 (*)    GFR calc Af Amer 75 (*)    All other components within normal limits  GLUCOSE, CAPILLARY - Abnormal; Notable for the following:    Glucose-Capillary 210 (*)    All other components within normal limits  GLUCOSE, CAPILLARY - Abnormal; Notable for the following:    Glucose-Capillary 180 (*)    All other components within normal limits  GLUCOSE, CAPILLARY -  Abnormal; Notable for  the following:    Glucose-Capillary 152 (*)    All other components within normal limits  GLUCOSE, CAPILLARY - Abnormal; Notable for the following:    Glucose-Capillary 166 (*)    All other components within normal limits  GLUCOSE, CAPILLARY - Abnormal; Notable for the following:    Glucose-Capillary 157 (*)    All other components within normal limits  GLUCOSE, CAPILLARY - Abnormal; Notable for the following:    Glucose-Capillary 149 (*)    All other components within normal limits  CULTURE, BLOOD (ROUTINE X 2)  CULTURE, BLOOD (ROUTINE X 2)  CULTURE, EXPECTORATED SPUTUM-ASSESSMENT  CULTURE, RESPIRATORY (NON-EXPECTORATED)  BRAIN NATRIURETIC PEPTIDE  TROPONIN I  HIV ANTIBODY (ROUTINE TESTING)  LEGIONELLA ANTIGEN, URINE  STREP PNEUMONIAE URINARY ANTIGEN  TSH    Imaging Review No results found.   EKG Interpretation   Date/Time:  Friday August 26 2014 14:53:47 EST Ventricular Rate:  93 PR Interval:  166 QRS Duration: 128 QT Interval:  404 QTC Calculation: 502 R Axis:   60 Text Interpretation:  Normal sinus rhythm Right bundle branch block Septal  infarct , age undetermined Possible Lateral infarct , age undetermined  Cannot rule out Inferior infarct , age undetermined Abnormal ECG ED  PHYSICIAN INTERPRETATION AVAILABLE IN [facility] [place] Confirmed by TEST,  Record ([id number]) on 08/28/2014 2:26:50 PM      Date: 08/26/2014  Rate: 93  Rhythm: normal sinus rhythm  QRS Axis: normal  Intervals: normal  ST/T Wave abnormalities: normal  Conduction Disutrbances:right bundle branch block  Narrative Interpretation: RBBB is new  Old EKG Reviewed: changes noted   MDM   Final diagnoses:  Cough  SOB (shortness of breath)  Lung nodule  HCAP (healthcare-associated pneumonia)    Patient with shortness of breath. Cough. States waking at night. Nodule on xray. CT done and showed pneumonia. Will admit    Jasper Riling. Alvino Chapel, MD 08/29/14 [phone]

## 2014-08-26 NOTE — ED Notes (Signed)
Dr. Wynelle Cleveland at the bedside.

## 2014-08-26 NOTE — ED Notes (Signed)
Patient transported to X-ray 

## 2014-08-26 NOTE — H&P (Addendum)
[facility] History and Physical  Mary Acosta WUJ:[id number] DOB: Aug 30, 1955 DOA: 08/26/2014   PCP: Trilby Drummer with [facility]   Chief Complaint: dyspnea  HPI: Mary Acosta is a 59 y.o. femalewith uncontrolled DM and uncontrolled HTN, morbid obesity s/p lap band, COPD, chronic pain presents with shortness of breath and cough for about 3 days now. Having trouble with remembering to breath when trying to sleep. Becomes anxious and sometimes wakes up 30 min later with palpitations. Now coughing up mucous which is brown. No fevers chills or chest pain. Was admitted to the hospital in Nov for hyperglycemia and therefore will be treated as HCAP.   General: The patient denies anorexia, fever, weight loss Cardiac: Denies chest pain, syncope, + palpitations + pedal edema + HTN Respiratory: per HPI GI: Denies severe indigestion/heartburn, abdominal pain, nausea, vomiting, diarrhea and + constipation GU: Denies hematuria, incontinence, dysuria  Musculoskeletal: + arthritis - goes to pain management in [place], fibromyalgia Skin: Denies suspicious skin lesions Neurologic: Denies focal weakness or numbness, change in vision- has neuropathy in legs Psychiatry: + depression or anxiety.  Endo: fasting sugars > 200 in AM  Past Medical History  Diagnosis Date  . Anxiety     with panic attacks  . Insomnia   . COPD (chronic obstructive pulmonary disease)   . Osteoarthritis     with severe disease in knee  . Polymyalgia rheumatica   . Fatty liver disease, nonalcoholic   . Scoliosis   . Asthma   . Depression   . History of TIA (transient ischemic attack) 11-01-2010-- NO RESIDUAL  . Hyperlipidemia   . DJD (degenerative joint disease)   . Lumbar stenosis   . Chronic pain syndrome     [facility] [place]  . History of hypertension S/P GASTRIC BANDING--- NO ISSUE SINCE WT. LOSS   . OSA (obstructive sleep apnea) NO CPAP SINCE WT LOSS  . Seasonal allergies   . Chronic narcotic use   . Diabetes mellitus type II STATES LAST A1C 6.4  . Thyroid cancer     Past Surgical History  Procedure Laterality Date  . Appendectomy  1982  . Tubal ligation  1983  . Hysteroscopy w/d&c  08-19-2007 & 05-16-2003    2008 PMB/ 2004 -- ALSO CRYOABLATION & LEEP FOR ABNORMAL PAP SMEAR  . Laparoscopic gastric banding  03/01/2006    TRUNCAL VAGOTOMY/ PLACEMENT OF VG BAND  . Knee arthroscopy w/ debridement  03/29/2006    INTERNAL DERANGEMENT/ SEVERE DJD/ MENISCUS TEARS  . Transvaginal subureteral tape/ sling  09-28-2010    MIXED URINARY INCONTINENCE  . Revision left total knee arthroplasty, poly revision  08-29-2008  . Total knee arthroplasty  01-23-2007    SEVERE DJD  . Total thyroidectomy  11-22-2005    BILATERAL THYROID NODULES-- PAPILLARY CARCINOMA (0.5CM)/ ADENOMATOID NODULES  . Left breast lumpectomy  02-28-2005    ATYPICAL DUCTAL HYPERPLASIA  . Cardiac catheterization  09-04-2004    NORMAL CORONARY ANATOMY/ NORMAL LVF/ EF 60%  . Laparoscopic cholecystectomy  06-10-2002  . Tonsillectomy  1969  . Transthoracic echocardiogram  12-27-2010    LVSF NORMAL / EF 95-62%/ GRADE I DIASTOLIC DYSFUNCTION/ MILD MITRAL REGURG. / MILDLY DILATED LEFT ATRIUM/ MILDY INCREASED SYSTOLIC PRESSURE OF PULMONARY ARTERIES  . Cardiovascular stress test  12-27-2010 DR Martinique    ABNORMAL NUCLEAR STUDY W/ /MILD INFERIOR ISCHEMIA/ EF 69%/ CT HEART ANGIOGRAM ; NO ACUTE FINDINGS  . Cystoscopy  05/18/2012    Procedure: CYSTOSCOPY;  Surgeon: Reece Packer, MD; Location: [facility]; Service: Urology; Laterality: N/A; examination under anethesia    Social History: does not smoke cigarettes quit in Feb 2012, does not drink alcohol Lives at Home with family     Allergies  Allergen Reactions  . Losartan Other (See Comments)    Myalgias and muscle cramping  . Sulfonamide Derivatives Nausea Only    Family History  Problem Relation Age of Onset  . Colon cancer Maternal Uncle     x 2  . Breast cancer Other     great aunts x 5  . Diabetes Mother   . Heart disease Father   . Heart disease Mother       Prior to Admission medications   Medication Sig Start Date End Date Taking? Authorizing Provider  acetaminophen (TYLENOL) 500 MG tablet Take 500 mg by mouth every 4 (four) hours as needed for moderate pain.   Yes Historical Provider, MD  amitriptyline (ELAVIL) 25 MG tablet Take 100 mg by mouth at bedtime.   Yes Historical Provider, MD  cetirizine (ZYRTEC) 10 MG tablet Take 10 mg by mouth daily as needed for allergies.    Yes Historical Provider, MD  clonazePAM (KLONOPIN) 0.5 MG tablet Take 0.5 mg by mouth daily as needed for anxiety.   Yes Historical Provider, MD  ibuprofen (ADVIL,MOTRIN) 200 MG tablet Take 200 mg by mouth every 6 (six) hours as needed. For knee pain   Yes Historical Provider, MD  insulin glargine (LANTUS) 100 UNIT/ML injection Inject 0.3 mLs (30 Units total) into the skin daily. 30 UNITS QD Patient taking differently: Inject 80 Units into the skin daily.  07/10/14  Yes Tasrif Ahmed, MD  levothyroxine (SYNTHROID, LEVOTHROID) 150 MCG tablet Take 150 mcg by mouth daily before breakfast.   Yes Historical Provider, MD  meloxicam (MOBIC) 7.5 MG tablet Take 7.5 mg by mouth 2 (two) times daily.    Yes Historical Provider, MD  morphine (MS CONTIN) 60 MG 12 hr tablet Take 60 mg by mouth every 12 (twelve) hours.   Yes Historical Provider, MD  potassium chloride SA (K-DUR,KLOR-CON) 20 MEQ tablet Take 20 mEq by mouth as needed (for cramping).  05/30/14  Yes Historical Provider, MD  ENABLEX 7.5 MG 24 hr tablet Take 7.5 mg by  mouth daily.  03/06/11   Historical Provider, MD  insulin lispro (HUMALOG PEN) 100 UNIT/ML SOPN Inject 10 Units into the skin 3 (three) times daily with meals. Patient not taking: Reported on 08/26/2014 05/11/13   Harden Mo, MD  insulin lispro (HUMALOG) 100 UNIT/ML injection Inject 0.1 mLs (10 Units total) into the skin 3 (three) times daily before meals. Patient not taking: Reported on 08/26/2014 07/10/14   Dellia Nims, MD  levothyroxine (SYNTHROID, LEVOTHROID) 137 MCG tablet Take 137 mcg by mouth daily before breakfast.    Historical Provider, MD     Physical Exam: Filed Vitals:   08/26/14 1738 08/26/14 1745 08/26/14 1800 08/26/14 1815  BP:  167/79 150/72 152/76  Pulse: 86 85 92 88  Temp:      TempSrc:      Resp:  11 15 14   SpO2: 100% 100% 93% 96%     General: AAO x3, morbidly obese, no distress HEENT: Normocephalic and Atraumatic, Mucous membranes pink  PERRLA; EOM intact; No scleral icterus,   Nares: Patent, Oropharynx: Clear, Fair Dentition   Neck: FROM, no cervical lymphadenopathy, thyromegaly, carotid bruit or JVD;  Breasts: deferred CHEST WALL: No tenderness  CHEST:  Normal respiration, bronchial breath sounds in right mid and lower lung fields HEART: Regular rate and rhythm; no murmurs rubs or gallops  BACK: No kyphosis or scoliosis; no CVA tenderness  ABDOMEN: Positive Bowel Sounds, soft, non-tender; no masses, no organomegaly Rectal Exam: deferred EXTREMITIES: No cyanosis, clubbing, +2 pitting edema in legs Genitalia: not examined  SKIN: no rash or ulceration  CNS: Alert and Oriented x 4, Nonfocal exam, CN 2-12 intact  Labs on Admission:  Basic Metabolic Panel:  Last Labs      Recent Labs Lab 08/26/14 1518  NA 134*  K 3.3*  CL 96  CO2 25  GLUCOSE 212*  BUN 8  CREATININE 0.75  CALCIUM 9.0     Liver Function Tests:  Last Labs      No results for input(s): AST, ALT, ALKPHOS, BILITOT, PROT, ALBUMIN in the last 168 hours.    Last Labs     No results for input(s): LIPASE, AMYLASE in the last 168 hours.    Last Labs     No results for input(s): AMMONIA in the last 168 hours.   CBC:  Last Labs      Recent Labs Lab 08/26/14 1518  WBC 7.5  NEUTROABS 5.1  HGB 13.8  HCT 42.6  MCV 80.5  PLT 199     Cardiac Enzymes:  Last Labs      Recent Labs Lab 08/26/14 1518  TROPONINI <0.03      BNP (last 3 results)  Recent Labs (within last 365 days)    No results for input(s): PROBNP in the last 8760 hours.   CBG:  Last Labs     No results for input(s): GLUCAP in the last 168 hours.    Radiological Exams on Admission:  Imaging Results (Last 48 hours)    Dg Chest 2 View  08/26/2014 CLINICAL DATA: Initial encounter for cough and wheezing for 3-4 days. EXAM: CHEST 2 VIEW COMPARISON: 11/04/2012. FINDINGS: Two view exam shows no edema or focal airspace consolidation. No pleural effusion. 2 cm nodular opacity is seen in the medial right lung base. The cardiopericardial silhouette is within normal limits for size. Imaged bony structures of the thorax are intact. IMPRESSION: 2 cm nodular density in the medial right lung base. Neoplasm is a concern. CT chest without contrast recommended to further evaluate. Findings discussed with Dr. Alvino Chapel at 1557 hrs on 08/26/2014. Electronically Signed By: Misty Stanley M.D. On: 08/26/2014 15:57   Ct Angio Chest Pe W/cm &/or Wo Cm  08/26/2014 CLINICAL DATA: Short of breath, 1 month duration. Abnormal chest radiograph at the right base. Personal history of thyroid cancer. EXAM: CT ANGIOGRAPHY CHEST WITH CONTRAST TECHNIQUE: Multidetector CT imaging of the chest was performed using the standard protocol during bolus administration of intravenous contrast. Multiplanar CT image reconstructions and MIPs were obtained to evaluate the vascular anatomy.  CONTRAST: 43mL OMNIPAQUE IOHEXOL 350 MG/ML SOLN COMPARISON: Chest radiography same day. Thoracic CT 11/04/2012. Chest radiography 11/04/2012. FINDINGS: There is patchy consolidation in the right lower lobe most consistent with bronchopneumonia. Lymph nodes of the right hilum are slightly prominent, probably reactive. The left lung is clear. No effusions. No acute aortic pathology. Mild aortic atherosclerosis. No pulmonary emboli. Old minor compression fracture at T5. No acute bony finding. Scans in the upper abdomen show lap band but no acute finding. Review of the MIP images confirms the above findings. IMPRESSION: Most consistent with right lower lobe pneumonia. Chest radiographic follow-up to clearing recommended. Electronically Signed By: Jan Fireman.D.  On: 08/26/2014 17:39     EKG: Independently reviewed. Sinus rhythm with RBBB and Qtc 547ms  Assessment/Plan .Principal Problem:   HCAP (healthcare-associated pneumonia) - Vanc and Cefepime - sputum cx, strep and legionella Ag, HIV  Active Problems:   COPD (chronic obstructive pulmonary disease) - stable - PRN Nebs    Diabetes Mellitus - uncontrolled per history- cont Lantus and add mod dose sliding scale    Essential hypertension with pedal edema - cont Lisinopril (takes 10 mg) -she forgot to mention it to pharm tech - add HCTZ and Norvasc - suspect diastolic CHF- obtain ECHO    Hypothyroid - s/p thyroidectomy for CA- cont synthroid  Chronic pain - cont Morphine  Hypokalemia - replace  Prolonged Qtc - follow - avoid QT prolonging meds  Consulted: none  Code Status: Full code Family Communication: Husband - number in chart  DVT Prophylaxis:Lovenox  Time spent: 11 min  [place], MD [facility]  If 7PM-7AM, please contact night-coverage [email or web address] 08/26/2014, 7:19 PM

## 2014-08-26 NOTE — ED Notes (Signed)
Pt escorted to restroom.

## 2014-08-27 DIAGNOSIS — E039 Hypothyroidism, unspecified: Secondary | ICD-10-CM

## 2014-08-27 LAB — GLUCOSE, CAPILLARY
GLUCOSE-CAPILLARY: 167 mg/dL — AB (ref 70–99)
GLUCOSE-CAPILLARY: 180 mg/dL — AB (ref 70–99)
Glucose-Capillary: 146 mg/dL — ABNORMAL HIGH (ref 70–99)
Glucose-Capillary: 210 mg/dL — ABNORMAL HIGH (ref 70–99)

## 2014-08-27 NOTE — Progress Notes (Signed)
PROGRESS NOTE  Mary Acosta ONG:[id number] DOB: 1955/11/26 DOA: 08/26/2014 PCP: Thressa Sheller, MD  HPI: Mary Acosta is a 59 y.o. femalewith uncontrolled DM and uncontrolled HTN, morbid obesity s/p lap band, COPD, chronic pain presents with shortness of breath and cough for about 3 days now. Having trouble with remembering to breath when trying to sleep. Becomes anxious and sometimes wakes up 30 min later with palpitations. Now coughing up mucous which is brown. No fevers chills or chest pain. Was admitted to the hospital in Nov for hyperglycemia and therefore will be treated as HCAP.  Subjective / 24 H Interval events - She is feeling better this morning, her breathing is better  Assessment/Plan: Principal Problem:   HCAP (healthcare-associated pneumonia) Active Problems:   Diabetes 1.5, managed as type 2   Essential hypertension   Hypothyroid   Pedal edema   HTN (hypertension)   COPD (chronic obstructive pulmonary disease)   HCAP (healthcare-associated pneumonia) - patient with recent hospitalization, continue vancomycin and cefepime for now, continue IV antibiotics for 48 hours, and if cultures are negative we'll transition her to oral antibiotics. - sputum cx, strep negative, and legionella Ag, HIV  COPD (chronic obstructive pulmonary disease) - stable - PRN Nebs  Diabetes Mellitus - uncontrolled per history- cont Lantus and add mod dose sliding scale  Essential hypertension with pedal edema - cont Lisinopril (takes 10 mg) -she forgot to mention it to pharm tech - add HCTZ and Norvasc - suspect diastolic CHF- obtain ECHO  Hypothyroid - s/p thyroidectomy for CA- cont synthroid  Chronic pain - cont Morphine  Hypokalemia - replace  Prolonged Qtc - follow - avoid QT prolonging meds  Diet: Diet heart healthy/carb modified Fluids: none DVT Prophylaxis: Lovenox  Code Status: Full Code Family Communication: none  Disposition Plan: home when  ready   Consultants:  None   Procedures:  None    Antibiotics Vancomycin 1/1>> Cefepime 1/1>>   Studies  Dg Chest 2 View  08/26/2014   CLINICAL DATA:  Initial encounter for cough and wheezing for 3-4 days.  EXAM: CHEST  2 VIEW  COMPARISON:  11/04/2012.  FINDINGS: Two view exam shows no edema or focal airspace consolidation. No pleural effusion. 2 cm nodular opacity is seen in the medial right lung base. The cardiopericardial silhouette is within normal limits for size. Imaged bony structures of the thorax are intact.  IMPRESSION: 2 cm nodular density in the medial right lung base. Neoplasm is a concern. CT chest without contrast recommended to further evaluate.  Findings discussed with Dr. Alvino Chapel at 1557 hrs on 08/26/2014.   Electronically Signed   By: Misty Stanley M.D.   On: 08/26/2014 15:57   Ct Angio Chest Pe W/cm &/or Wo Cm  08/26/2014   CLINICAL DATA:  Short of breath, 1 month duration. Abnormal chest radiograph at the right base. Personal history of thyroid cancer.  EXAM: CT ANGIOGRAPHY CHEST WITH CONTRAST  TECHNIQUE: Multidetector CT imaging of the chest was performed using the standard protocol during bolus administration of intravenous contrast. Multiplanar CT image reconstructions and MIPs were obtained to evaluate the vascular anatomy.  CONTRAST:  66mL OMNIPAQUE IOHEXOL 350 MG/ML SOLN  COMPARISON:  Chest radiography same day. Thoracic CT 11/04/2012. Chest radiography 11/04/2012.  FINDINGS: There is patchy consolidation in the right lower lobe most consistent with bronchopneumonia. Lymph nodes of the right hilum are slightly prominent, probably reactive.  The left lung is clear.  No effusions. No acute aortic pathology. Mild aortic  atherosclerosis. No pulmonary emboli.  Old minor compression fracture at T5.  No acute bony finding.  Scans in the upper abdomen show lap band but no acute finding.  Review of the MIP images confirms the above findings.  IMPRESSION: Most consistent with  right lower lobe pneumonia. Chest radiographic follow-up to clearing recommended.   Electronically Signed   By: Nelson Chimes M.D.   On: 08/26/2014 17:39     Objective  Filed Vitals:   08/26/14 2005 08/26/14 2041 08/27/14 0046 08/27/14 0637  BP:  158/89 147/68 146/92  Pulse:   78 68  Temp:  98.2 F (36.8 C) 98.4 F (36.9 C) 98.4 F (36.9 C)  TempSrc:  Oral Oral   Resp:  18 18 20   Height:  5\' 9"  (1.753 m)    Weight: 126.055 kg (277 lb 14.4 oz) 126.9 kg (279 lb 12.2 oz)    SpO2:  96% 99% 98%    Intake/Output Summary (Last 24 hours) at 08/27/14 0714 Last data filed at 08/27/14 0532  Gross per 24 hour  Intake    240 ml  Output    200 ml  Net     40 ml   Filed Weights   08/26/14 2005 08/26/14 2041  Weight: 126.055 kg (277 lb 14.4 oz) 126.9 kg (279 lb 12.2 oz)    Exam:  General:  NAD, lying in bed  HEENT: no LAD, no scleral icterus  Cardiovascular: RRR without murmurs  Respiratory: no wheezing, coarse breath sounds due to cough  Abdomen: soft, non tender, BS+  MSK/Extremities: no cyanosis/clubbing  Skin: no rashes  Neuro: non focal   Data Reviewed: Basic Metabolic Panel:  Recent Labs Lab 08/26/14 1518 08/26/14 2008  NA 134*  --   K 3.3*  --   CL 96  --   CO2 25  --   GLUCOSE 212*  --   BUN 8  --   CREATININE 0.75 0.82  CALCIUM 9.0  --    CBC:  Recent Labs Lab 08/26/14 1518 08/26/14 2008  WBC 7.5 9.5  NEUTROABS 5.1  --   HGB 13.8 14.1  HCT 42.6 43.0  MCV 80.5 81.6  PLT 199 227   Cardiac Enzymes:  Recent Labs Lab 08/26/14 1518  TROPONINI <0.03   CBG:  Recent Labs Lab 08/26/14 2039  GLUCAP 264*    Scheduled Meds: . amitriptyline  100 mg Oral QHS  . amLODipine  5 mg Oral Daily  . ceFEPime (MAXIPIME) IV  1 g Intravenous 3 times per day  . enoxaparin (LOVENOX) injection  40 mg Subcutaneous QHS  . guaiFENesin  600 mg Oral BID  . hydrochlorothiazide  12.5 mg Oral Daily  . insulin aspart  0-15 Units Subcutaneous TID WC  . insulin  aspart  0-5 Units Subcutaneous QHS  . insulin glargine  80 Units Subcutaneous Daily  . levothyroxine  150 mcg Oral QAC breakfast  . lisinopril  10 mg Oral Daily  . meloxicam  7.5 mg Oral BID  . morphine  60 mg Oral Q12H  . vancomycin  1,250 mg Intravenous Q12H   Continuous Infusions:   Marzetta Board, MD [facility] Pager [phone]. If 7 PM - 7 AM, please contact night-coverage at [email or web address], password [facility] 08/27/2014, 7:14 AM  LOS: 1 day

## 2014-08-27 NOTE — Plan of Care (Signed)
Problem: Phase I Progression Outcomes Goal: Initial discharge plan identified Outcome: Completed/Met Date Met:  08/27/14 Return home with husband

## 2014-08-27 NOTE — Plan of Care (Signed)
Problem: Phase II Progression Outcomes Goal: Progress activity as tolerated unless otherwise ordered Outcome: Completed/Met Date Met:  08/27/14 Sat up in chair today

## 2014-08-28 DIAGNOSIS — I517 Cardiomegaly: Secondary | ICD-10-CM

## 2014-08-28 LAB — BASIC METABOLIC PANEL
Anion gap: 5 (ref 5–15)
BUN: 12 mg/dL (ref 6–23)
CHLORIDE: 96 meq/L (ref 96–112)
CO2: 34 mmol/L — AB (ref 19–32)
Calcium: 9 mg/dL (ref 8.4–10.5)
Creatinine, Ser: 0.95 mg/dL (ref 0.50–1.10)
GFR calc Af Amer: 75 mL/min — ABNORMAL LOW (ref >90)
GFR calc non Af Amer: 65 mL/min — ABNORMAL LOW (ref >90)
GLUCOSE: 144 mg/dL — AB (ref 70–99)
Potassium: 3.9 mmol/L (ref 3.5–5.1)
SODIUM: 135 mmol/L (ref 135–145)

## 2014-08-28 LAB — EXPECTORATED SPUTUM ASSESSMENT W GRAM STAIN, RFLX TO RESP C

## 2014-08-28 LAB — HIV ANTIBODY (ROUTINE TESTING W REFLEX): HIV: NONREACTIVE

## 2014-08-28 LAB — LEGIONELLA ANTIGEN, URINE

## 2014-08-28 LAB — GLUCOSE, CAPILLARY
GLUCOSE-CAPILLARY: 152 mg/dL — AB (ref 70–99)
GLUCOSE-CAPILLARY: 166 mg/dL — AB (ref 70–99)
Glucose-Capillary: 157 mg/dL — ABNORMAL HIGH (ref 70–99)
Glucose-Capillary: 149 mg/dL — ABNORMAL HIGH (ref 70–99)

## 2014-08-28 LAB — TSH: TSH: 3.32 u[IU]/mL (ref 0.350–4.500)

## 2014-08-28 LAB — EXPECTORATED SPUTUM ASSESSMENT W REFEX TO RESP CULTURE

## 2014-08-28 MED ORDER — SODIUM CHLORIDE 0.9 % IV SOLN
INTRAVENOUS | Status: DC
  Administered 2014-08-28 (×2): via INTRAVENOUS

## 2014-08-28 MED ORDER — GUAIFENESIN-CODEINE 100-10 MG/5ML PO SOLN
5.0000 mL | ORAL | Status: DC | PRN
Start: 2014-08-28 — End: 2014-08-29
  Administered 2014-08-28: 5 mL via ORAL
  Filled 2014-08-28: qty 5

## 2014-08-28 MED ORDER — DIPHENHYDRAMINE HCL 25 MG PO CAPS: 25.0000 mg | ORAL_CAPSULE | Freq: Once | ORAL | Status: DC

## 2014-08-28 MED ORDER — PREDNISONE 20 MG PO TABS
40.0000 mg | ORAL_TABLET | Freq: Every day | ORAL | Status: DC
  Administered 2014-08-28 – 2014-08-29 (×2): 40 mg via ORAL
  Filled 2014-08-28 (×5): qty 2

## 2014-08-28 MED ORDER — POLYETHYLENE GLYCOL 3350 17 G PO PACK
17.0000 g | PACK | Freq: Two times a day (BID) | ORAL | Status: DC | PRN
  Administered 2014-08-29: 17 g via ORAL
  Filled 2014-08-28 (×2): qty 1

## 2014-08-28 NOTE — Progress Notes (Signed)
PROGRESS NOTE  Mary Acosta HQP:[id number] DOB: 02-21-1956 DOA: 08/26/2014 PCP: Thressa Sheller, MD  HPI: Mary Acosta is a 59 y.o. femalewith uncontrolled DM and uncontrolled HTN, morbid obesity s/p lap band, COPD, chronic pain presents with shortness of breath and cough for about 3 days now. Having trouble with remembering to breath when trying to sleep. Becomes anxious and sometimes wakes up 30 min later with palpitations. Now coughing up mucous which is brown. No fevers chills or chest pain. Was admitted to the hospital in Nov for hyperglycemia and therefore will be treated as HCAP.  Subjective / 24 H Interval events - appreciates more wheezing, endorses severe cough   Assessment/Plan: Principal Problem:   HCAP (healthcare-associated pneumonia) Active Problems:   Diabetes 1.5, managed as type 2   Essential hypertension   Hypothyroid   Pedal edema   HTN (hypertension)   COPD (chronic obstructive pulmonary disease)   HCAP (healthcare-associated pneumonia) - patient with recent hospitalization, continue vancomycin and cefepime for now, continue IV antibiotics for 48 hours, and if cultures are negative we'll transition her to oral antibiotics. - sputum cx, strep negative, and legionella Ag, HIV - maintain IV Abx today  COPD (chronic obstructive pulmonary disease) - more wheezing today - add steroids - carefully watch her CBGs  Diabetes Mellitus - uncontrolled per history- cont Lantus and add mod dose sliding scale  Essential hypertension with pedal edema - cont Lisinopril (takes 10 mg) -she forgot to mention it to pharm tech - add HCTZ and Norvasc - suspect diastolic CHF- obtain ECHO  Hypothyroid - s/p thyroidectomy for CA- cont synthroid  Chronic pain - cont Morphine  Hypokalemia - replace  Prolonged Qtc - follow - avoid QT prolonging meds  Diet: Diet heart healthy/carb modified Fluids: none DVT Prophylaxis: Lovenox  Code Status: Full  Code Family Communication: none  Disposition Plan: home when ready   Consultants:  None   Procedures:  None    Antibiotics Vancomycin 1/1>> Cefepime 1/1>>   Studies  No results found.   Objective  Filed Vitals:   08/27/14 0637 08/27/14 1348 08/27/14 2248 08/28/14 0605  BP: 146/92 145/72 152/75 123/64  Pulse: 68 80 76 81  Temp: 98.4 F (36.9 C) 98 F (36.7 C) 98 F (36.7 C) 97.5 F (36.4 C)  TempSrc:  Oral Oral Oral  Resp: 20 16 17 20   Height:      Weight:      SpO2: 98% 99% 100% 95%    Intake/Output Summary (Last 24 hours) at 08/28/14 0823 Last data filed at 08/28/14 0810  Gross per 24 hour  Intake    360 ml  Output   2850 ml  Net  -2490 ml   Filed Weights   08/26/14 2005 08/26/14 2041  Weight: 126.055 kg (277 lb 14.4 oz) 126.9 kg (279 lb 12.2 oz)    Exam:  General:  NAD, lying in bed  HEENT: no LAD, no scleral icterus  Cardiovascular: RRR without murmurs  Respiratory: scattered wheezing, coarse breath sounds.  Abdomen: soft, non tender, BS+  MSK/Extremities: no cyanosis/clubbing  Skin: no rashes  Neuro: non focal   Data Reviewed: Basic Metabolic Panel:  Recent Labs Lab 08/26/14 1518 08/26/14 2008 08/28/14 0444  NA 134*  --  135  K 3.3*  --  3.9  CL 96  --  96  CO2 25  --  34*  GLUCOSE 212*  --  144*  BUN 8  --  12  CREATININE 0.75 0.82  0.95  CALCIUM 9.0  --  9.0   CBC:  Recent Labs Lab 08/26/14 1518 08/26/14 2008  WBC 7.5 9.5  NEUTROABS 5.1  --   HGB 13.8 14.1  HCT 42.6 43.0  MCV 80.5 81.6  PLT 199 227   Cardiac Enzymes:  Recent Labs Lab 08/26/14 1518  TROPONINI <0.03   CBG:  Recent Labs Lab 08/26/14 2039 08/27/14 0800 08/27/14 1151 08/27/14 1646 08/27/14 2248  GLUCAP 264* 146* 167* 210* 180*    Scheduled Meds: . amitriptyline  100 mg Oral QHS  . amLODipine  5 mg Oral Daily  . ceFEPime (MAXIPIME) IV  1 g Intravenous 3 times per day  . enoxaparin (LOVENOX) injection  40 mg Subcutaneous QHS  .  hydrochlorothiazide  12.5 mg Oral Daily  . insulin aspart  0-15 Units Subcutaneous TID WC  . insulin aspart  0-5 Units Subcutaneous QHS  . insulin glargine  80 Units Subcutaneous Daily  . levothyroxine  150 mcg Oral QAC breakfast  . lisinopril  10 mg Oral Daily  . meloxicam  7.5 mg Oral BID  . morphine  60 mg Oral Q12H  . predniSONE  40 mg Oral Q breakfast  . vancomycin  1,250 mg Intravenous Q12H   Continuous Infusions: . sodium chloride      Marzetta Board, MD [facility] Pager [phone]. If 7 PM - 7 AM, please contact night-coverage at [email or web address], password [facility] 08/28/2014, 8:23 AM  LOS: 2 days

## 2014-08-28 NOTE — Progress Notes (Signed)
  Echocardiogram 2D Echocardiogram has been performed.  Diamond Nickel 08/28/2014, 4:19 PM

## 2014-08-29 LAB — GLUCOSE, CAPILLARY
Glucose-Capillary: 256 mg/dL — ABNORMAL HIGH (ref 70–99)
Glucose-Capillary: 201 mg/dL — ABNORMAL HIGH (ref 70–99)

## 2014-08-29 MED ORDER — LEVOFLOXACIN 750 MG PO TABS: 750.0000 mg | ORAL_TABLET | Freq: Every day | ORAL | Status: DC

## 2014-08-29 MED ORDER — POLYETHYLENE GLYCOL 3350 17 G PO PACK: 17.0000 g | PACK | Freq: Two times a day (BID) | ORAL | Status: DC | PRN

## 2014-08-29 MED ORDER — INSULIN LISPRO 100 UNIT/ML ~~LOC~~ SOLN: 10.0000 [IU] | Freq: Three times a day (TID) | SUBCUTANEOUS | Status: DC

## 2014-08-29 MED ORDER — AMLODIPINE BESYLATE 5 MG PO TABS: 5.0000 mg | ORAL_TABLET | Freq: Every day | ORAL | Status: DC

## 2014-08-29 MED ORDER — LISINOPRIL 10 MG PO TABS: 10.0000 mg | ORAL_TABLET | Freq: Every day | ORAL | Status: DC

## 2014-08-29 MED ORDER — INSULIN GLARGINE 100 UNIT/ML ~~LOC~~ SOLN: 80.0000 [IU] | Freq: Every day | SUBCUTANEOUS | Status: DC

## 2014-08-29 MED ORDER — HYDROCHLOROTHIAZIDE 12.5 MG PO CAPS: 12.5000 mg | ORAL_CAPSULE | Freq: Every day | ORAL | Status: DC

## 2014-08-29 MED ORDER — LEVOFLOXACIN 750 MG PO TABS
750.0000 mg | ORAL_TABLET | Freq: Every day | ORAL | Status: DC
  Administered 2014-08-29: 750 mg via ORAL
  Filled 2014-08-29: qty 1

## 2014-08-29 MED ORDER — PREDNISONE 10 MG PO TABS: ORAL_TABLET | ORAL | Status: DC

## 2014-08-29 NOTE — Progress Notes (Signed)
Pt given discharge instructions, prescriptions, and PIV removed.  Pt denied any needs at this time.  Pt taken to discharge location via wheelchair.

## 2014-08-29 NOTE — Progress Notes (Signed)
Inpatient Diabetes Program Recommendations  AACE/ADA: New Consensus Statement on Inpatient Glycemic Control (2013)  Target Ranges:  Prepandial:   less than 140 mg/dL      Peak postprandial:   less than 180 mg/dL (1-2 hours)      Critically ill patients:  140 - 180 mg/dL   Reason for Visit:  Referral received for patients concerns regarding hypoglycemia.    Spoke at length with patient.  She states that she takes Lantus and Humalog at home (when she can afford it).  She has Medicare part D and states that she had hit the "doughnut hole" and therefore was on Reli-on NPH 40 units bid and Novolin R with meals.  She states that her issues with hypoglycemia have occurred in the past when she takes her Humalog.  She states that she waits 30 minutes after taking her Humalog to eat.  Explained that the onset of Humalog is more rapid than Regular and therefore should be taken with meal or only a few minutes before.  Patient was able to teach back action of Humalog and the need to take with food. She see's Dr. Chalmers Cater but states that she does need new Rx's for insulin at discharge.  Her Lantus and Humalog should be covered better now due to it being a new year.  Discussed with Dr. Cruzita Lederer.    Thanks, Adah Perl, RN, BC-ADM Inpatient Diabetes Coordinator Pager [phone]

## 2014-08-29 NOTE — Care Management Note (Signed)
    Page 1 of 1   08/29/2014     4:26:11 PM CARE MANAGEMENT NOTE 08/29/2014  Patient:  Mary Acosta, Mary Acosta   Account Number:  [id number]  Date Initiated:  08/29/2014  Documentation initiated by:  Tomi Bamberger  Subjective/Objective Assessment:   dx hcap  admit- lives with spouse.     Action/Plan:   Anticipated DC Date:  08/29/2014   Anticipated DC Plan:  [place]  CM consult      Choice offered to / List presented to:             Status of service:  Completed, signed off Medicare Important Message given?  YES (If response is "NO", the following Medicare IM given date fields will be blank) Date Medicare IM given:  08/29/2014 Medicare IM given by:  Tomi Bamberger Date Additional Medicare IM given:   Additional Medicare IM given by:    Discharge Disposition:  HOME/SELF CARE  Per UR Regulation:  Reviewed for med. necessity/level of care/duration of stay  If discussed at [place] of Stay Meetings, dates discussed:    Comments:  08/29/14 [place], BSN [phone] no needs anticpated.

## 2014-08-29 NOTE — Discharge Summary (Signed)
Physician Discharge Summary  Zaryah Seckel GBT:[id number] DOB: [birth year] DOA: 08/26/2014  PCP: Thressa Sheller, MD  Admit date: 08/26/2014 Discharge date: 08/29/2014  Time spent: 45 minutes  Recommendations for Outpatient Follow-up:  1.  Follow up CXR in 4 weeks to ensure clearing of pneumonia. 2.  Patient on new BP medication regimen. Please follow BP  Discharge Diagnoses:  Principal Problem:   HCAP (healthcare-associated pneumonia) Active Problems:   Diabetes 1.5, managed as type 2   Essential hypertension   Hypothyroid   Pedal edema   HTN (hypertension)   COPD (chronic obstructive pulmonary disease)  Discharge Condition: stable.  Diet recommendation: carb modified.  Filed Weights   08/26/14 2005 08/26/14 2041  Weight: 126.055 kg (277 lb 14.4 oz) 126.9 kg (279 lb 12.2 oz)   History of present illness:  Mary Acosta is a 59 y.o. femalewith uncontrolled DM and uncontrolled HTN, morbid obesity s/p lap band, COPD, chronic pain who presented with shortness of breath and cough for 3 days prior to admission.  She becomes anxious at home and sometimes wakes with palpitations. On admission she was coughing up mucous which was brown. No fevers chills or chest pain. Was admitted to the hospital in Nov for hyperglycemia and therefore will be treated as HCAP.  Hospital Course:   HCAP (healthcare-associated pneumonia) Ms. Madaline Guthrie with treated with IV Vancomycin and Cefepime, nebulizers and steroids for 3 days. She was then transitioned to oral medications.  She is no longer having shortness of breath or difficulty breathing.  She will be discharged on Levaquin for a total antibiotic treatment of 7 days, and prednisone for 4 more days. Blood cultures show no growth to date at the time of d/c, urinary strep Ag negative, and legionella Ag negative, HIV non reactive.  COPD (chronic obstructive pulmonary disease) Improved on steroids. Does not appear to be on medications at  home for this. Question whether or not she may need a maintenance medication. Will defer to her primary care physician.   Diabetes Mellitus Most recent A1C was 10.3. Patient reported she was afraid to take larger doses of insulin. Fearful of hypoglycemia. Patient received education. She was discharged with refills of her insulin. She told me she plans to follow up with Dr. Chalmers Cater in 2 weeks.  Essential hypertension with pedal edema Discharged on HCTZ, Norvasc, and Lisinopril. 2D Echo showed Grade 1 diastolic dysfunction.  Hypothyroid S/P thyroidectomy for CA.  Continue Synthroid at 150 mcg per day.  Chronic pain Cont Morphine  Hypokalemia Replaced  Prolonged Qtc Avoid QT prolonging meds   Procedures:  2D Echo  Consultations:  None   Discharge Exam: Filed Vitals:   08/28/14 1406 08/28/14 2117 08/28/14 2304 08/29/14 0516  BP: 112/55 150/92 130/63 152/92  Pulse: 80     Temp: 98.3 F (36.8 C) 98.4 F (36.9 C)  97.7 F (36.5 C)  TempSrc: Oral Oral  Oral  Resp: 16 16  17   Height:      Weight:      SpO2: 98% 95%  94%    General: NAD, sitting up in recliner.  Pleasant.  A&O   HEENT: no LAD, no scleral icterus  Cardiovascular: RRR without murmurs  Respiratory: no increased work of breathing.  CTA  Abdomen: Obese, soft, non tender, BS+  MSK/Extremities: no cyanosis/clubbing, Changes of venous stasis.  1+ edema bilaterally.  Skin: no rashes  Neuro: non focal  Discharge Instructions  Discharge Instructions    Diet - low sodium heart healthy  Complete by:  As directed      Increase activity slowly    Complete by:  As directed           Current Discharge Medication List    START taking these medications   Details  amLODipine (NORVASC) 5 MG tablet Take 1 tablet (5 mg total) by mouth daily. Qty: 30 tablet, Refills: 2    hydrochlorothiazide (MICROZIDE) 12.5 MG capsule Take 1 capsule (12.5 mg total) by mouth daily. Qty: 30 capsule, Refills: 2     levofloxacin (LEVAQUIN) 750 MG tablet Take 1 tablet (750 mg total) by mouth daily. Qty: 3 tablet, Refills: 0    lisinopril (PRINIVIL,ZESTRIL) 10 MG tablet Take 1 tablet (10 mg total) by mouth daily. Qty: 30 tablet, Refills: 2    polyethylene glycol (MIRALAX / GLYCOLAX) packet Take 17 g by mouth 2 (two) times daily as needed for moderate constipation. Qty: 14 each, Refills: 0    predniSONE (DELTASONE) 10 MG tablet Take 2 tablets with breakfast for two mornings.  On the third and fourth mornings take 1 tablet with breakfast.  Then stop. Qty: 6 tablet, Refills: 0      CONTINUE these medications which have CHANGED   Details  insulin glargine (LANTUS) 100 UNIT/ML injection Inject 0.8 mLs (80 Units total) into the skin daily. Qty: 10 mL, Refills: 11    insulin lispro (HUMALOG) 100 UNIT/ML injection Inject 0.1 mLs (10 Units total) into the skin 3 (three) times daily before meals. Qty: 10 mL, Refills: 11      CONTINUE these medications which have NOT CHANGED   Details  acetaminophen (TYLENOL) 500 MG tablet Take 500 mg by mouth every 4 (four) hours as needed for moderate pain.    amitriptyline (ELAVIL) 25 MG tablet Take 100 mg by mouth at bedtime.    cetirizine (ZYRTEC) 10 MG tablet Take 10 mg by mouth daily as needed for allergies.     clonazePAM (KLONOPIN) 0.5 MG tablet Take 0.5 mg by mouth daily as needed for anxiety.    ibuprofen (ADVIL,MOTRIN) 200 MG tablet Take 200 mg by mouth every 6 (six) hours as needed. For knee pain    levothyroxine (SYNTHROID, LEVOTHROID) 150 MCG tablet Take 150 mcg by mouth daily before breakfast.    meloxicam (MOBIC) 7.5 MG tablet Take 7.5 mg by mouth 2 (two) times daily.     morphine (MS CONTIN) 60 MG 12 hr tablet Take 60 mg by mouth every 12 (twelve) hours.    potassium chloride SA (K-DUR,KLOR-CON) 20 MEQ tablet Take 20 mEq by mouth as needed (for cramping).  Refills: 1    ENABLEX 7.5 MG 24 hr tablet Take 7.5 mg by mouth daily.       STOP taking  these medications     insulin lispro (HUMALOG PEN) 100 UNIT/ML SOPN        Allergies  Allergen Reactions  . Losartan Other (See Comments)    Myalgias and muscle cramping  . Sulfonamide Derivatives Nausea Only   Follow-up Information    Follow up with Thressa Sheller, MD On 09/05/2014.   Specialty:  Internal Medicine   Why:  Appointment with Dr. Melynda Keller 11th at [place] information:   [address] [place] [address] [phone]       Follow up with Jacelyn Pi, MD In 2 weeks.   Specialty:  Endocrinology   Contact information:   [address] [place] [address] [phone]  The results of significant diagnostics from this hospitalization (including imaging, microbiology, ancillary and laboratory) are listed below for reference.    Significant Diagnostic Studies: Dg Chest 2 View  08/26/2014   CLINICAL DATA:  Initial encounter for cough and wheezing for 3-4 days.  EXAM: CHEST  2 VIEW  COMPARISON:  11/04/2012.  FINDINGS: Two view exam shows no edema or focal airspace consolidation. No pleural effusion. 2 cm nodular opacity is seen in the medial right lung base. The cardiopericardial silhouette is within normal limits for size. Imaged bony structures of the thorax are intact.  IMPRESSION: 2 cm nodular density in the medial right lung base. Neoplasm is a concern. CT chest without contrast recommended to further evaluate.  Findings discussed with Dr. Alvino Chapel at 1557 hrs on 08/26/2014.   Electronically Signed   By: Misty Stanley M.D.   On: 08/26/2014 15:57   Ct Angio Chest Pe W/cm &/or Wo Cm  08/26/2014   CLINICAL DATA:  Short of breath, 1 month duration. Abnormal chest radiograph at the right base. Personal history of thyroid cancer.  EXAM: CT ANGIOGRAPHY CHEST WITH CONTRAST  TECHNIQUE: Multidetector CT imaging of the chest was performed using the standard protocol during bolus administration of intravenous contrast. Multiplanar  CT image reconstructions and MIPs were obtained to evaluate the vascular anatomy.  CONTRAST:  58mL OMNIPAQUE IOHEXOL 350 MG/ML SOLN  COMPARISON:  Chest radiography same day. Thoracic CT 11/04/2012. Chest radiography 11/04/2012.  FINDINGS: There is patchy consolidation in the right lower lobe most consistent with bronchopneumonia. Lymph nodes of the right hilum are slightly prominent, probably reactive.  The left lung is clear.  No effusions. No acute aortic pathology. Mild aortic atherosclerosis. No pulmonary emboli.  Old minor compression fracture at T5.  No acute bony finding.  Scans in the upper abdomen show lap band but no acute finding.  Review of the MIP images confirms the above findings.  IMPRESSION: Most consistent with right lower lobe pneumonia. Chest radiographic follow-up to clearing recommended.   Electronically Signed   By: Nelson Chimes M.D.   On: 08/26/2014 17:39   Microbiology: Recent Results (from the past 240 hour(s))  Culture, blood (routine x 2) Call MD if unable to obtain prior to antibiotics being given     Status: None (Preliminary result)   Collection Time: 08/26/14  8:08 PM  Result Value Ref Range Status   Specimen Description BLOOD RIGHT ARM  Final   Special Requests BOTTLES DRAWN AEROBIC AND ANAEROBIC 5 CC  Final   Culture   Final           BLOOD CULTURE RECEIVED NO GROWTH TO DATE CULTURE WILL BE HELD FOR 5 DAYS BEFORE ISSUING A FINAL NEGATIVE REPORT Performed at [facility]    Report Status PENDING  Incomplete  Culture, blood (routine x 2) Call MD if unable to obtain prior to antibiotics being given     Status: None (Preliminary result)   Collection Time: 08/26/14  8:17 PM  Result Value Ref Range Status   Specimen Description BLOOD LEFT HAND  Final   Special Requests BOTTLES DRAWN AEROBIC AND ANAEROBIC 5CC  Final   Culture   Final           BLOOD CULTURE RECEIVED NO GROWTH TO DATE CULTURE WILL BE HELD FOR 5 DAYS BEFORE ISSUING A FINAL NEGATIVE  REPORT Performed at [facility]    Report Status PENDING  Incomplete  Culture, sputum-assessment     Status: None  Collection Time: 08/28/14  2:42 PM  Result Value Ref Range Status   Specimen Description SPUTUM  Final   Special Requests NONE  Final   Sputum evaluation   Final    THIS SPECIMEN IS ACCEPTABLE. RESPIRATORY CULTURE REPORT TO FOLLOW.   Report Status 08/28/2014 FINAL  Final  Culture, respiratory (NON-Expectorated)     Status: None (Preliminary result)   Collection Time: 08/28/14  2:42 PM  Result Value Ref Range Status   Specimen Description SPUTUM  Final   Special Requests NONE  Final   Gram Stain   Final    FEW WBC PRESENT, PREDOMINANTLY PMN RARE SQUAMOUS EPITHELIAL CELLS PRESENT MODERATE YEAST RARE GRAM POSITIVE COCCI IN PAIRS Performed at [facility]    Culture   Final    Culture reincubated for better growth Performed at [facility]    Report Status PENDING  Incomplete   Labs: Basic Metabolic Panel:  Recent Labs Lab 08/26/14 1518 08/26/14 2008 08/28/14 0444  NA 134*  --  135  K 3.3*  --  3.9  CL 96  --  96  CO2 25  --  34*  GLUCOSE 212*  --  144*  BUN 8  --  12  CREATININE 0.75 0.82 0.95  CALCIUM 9.0  --  9.0   CBC:  Recent Labs Lab 08/26/14 1518 08/26/14 2008  WBC 7.5 9.5  NEUTROABS 5.1  --   HGB 13.8 14.1  HCT 42.6 43.0  MCV 80.5 81.6  PLT 199 227   Cardiac Enzymes:  Recent Labs Lab 08/26/14 1518  TROPONINI <0.03   CBG:  Recent Labs Lab 08/28/14 0749 08/28/14 1136 08/28/14 1725 08/28/14 2117 08/29/14 0803  GLUCAP 152* 166* 157* 149* 256*   Signed:  Melton Alar, PA-C  [facility] 08/29/2014, 11:31 AM  Patient was seen, examined,treatment plan was discussed with the Advance Practice Provider.  I have directly reviewed the clinical findings, lab, imaging studies and management of this patient in detail. I have made the necessary changes to the above noted documentation, and agree  with the documentation, as recorded by the Advance Practice Provider.   Marzetta Board, MD [facility] [phone]

## 2014-08-30 LAB — CULTURE, RESPIRATORY

## 2014-08-30 LAB — CULTURE, RESPIRATORY W GRAM STAIN

## 2014-09-02 LAB — CULTURE, BLOOD (ROUTINE X 2)
Culture: NO GROWTH
Culture: NO GROWTH

## 2014-09-08 ENCOUNTER — Encounter (HOSPITAL_COMMUNITY): Payer: Self-pay | Admitting: *Deleted

## 2014-09-08 ENCOUNTER — Emergency Department (HOSPITAL_COMMUNITY): Payer: Medicare Other

## 2014-09-08 ENCOUNTER — Inpatient Hospital Stay (HOSPITAL_COMMUNITY)
Admission: EM | Admit: 2014-09-08 | Discharge: 2014-09-11 | DRG: 191 | Disposition: A | Discharge status: Home / Self Care | Payer: Medicare Other | Attending: Internal Medicine | Admitting: Internal Medicine

## 2014-09-08 DIAGNOSIS — E039 Hypothyroidism, unspecified: Secondary | ICD-10-CM | POA: Diagnosis present

## 2014-09-08 DIAGNOSIS — Z794 Long term (current) use of insulin: Secondary | ICD-10-CM | POA: Diagnosis not present

## 2014-09-08 DIAGNOSIS — J441 Chronic obstructive pulmonary disease with (acute) exacerbation: Principal | ICD-10-CM

## 2014-09-08 DIAGNOSIS — R0602 Shortness of breath: Secondary | ICD-10-CM | POA: Diagnosis present

## 2014-09-08 DIAGNOSIS — T380X5A Adverse effect of glucocorticoids and synthetic analogues, initial encounter: Secondary | ICD-10-CM | POA: Diagnosis present

## 2014-09-08 DIAGNOSIS — Z8541 Personal history of malignant neoplasm of cervix uteri: Secondary | ICD-10-CM | POA: Diagnosis not present

## 2014-09-08 DIAGNOSIS — K219 Gastro-esophageal reflux disease without esophagitis: Secondary | ICD-10-CM | POA: Diagnosis present

## 2014-09-08 DIAGNOSIS — R059 Cough, unspecified: Secondary | ICD-10-CM

## 2014-09-08 DIAGNOSIS — G4733 Obstructive sleep apnea (adult) (pediatric): Secondary | ICD-10-CM | POA: Diagnosis present

## 2014-09-08 DIAGNOSIS — D72829 Elevated white blood cell count, unspecified: Secondary | ICD-10-CM

## 2014-09-08 DIAGNOSIS — E038 Other specified hypothyroidism: Secondary | ICD-10-CM

## 2014-09-08 DIAGNOSIS — F17200 Nicotine dependence, unspecified, uncomplicated: Secondary | ICD-10-CM | POA: Diagnosis present

## 2014-09-08 DIAGNOSIS — J449 Chronic obstructive pulmonary disease, unspecified: Secondary | ICD-10-CM | POA: Diagnosis present

## 2014-09-08 DIAGNOSIS — Z87891 Personal history of nicotine dependence: Secondary | ICD-10-CM | POA: Diagnosis not present

## 2014-09-08 DIAGNOSIS — G894 Chronic pain syndrome: Secondary | ICD-10-CM | POA: Diagnosis present

## 2014-09-08 DIAGNOSIS — K760 Fatty (change of) liver, not elsewhere classified: Secondary | ICD-10-CM | POA: Diagnosis present

## 2014-09-08 DIAGNOSIS — R197 Diarrhea, unspecified: Secondary | ICD-10-CM | POA: Diagnosis not present

## 2014-09-08 DIAGNOSIS — E139 Other specified diabetes mellitus without complications: Secondary | ICD-10-CM | POA: Diagnosis present

## 2014-09-08 DIAGNOSIS — I10 Essential (primary) hypertension: Secondary | ICD-10-CM | POA: Diagnosis not present

## 2014-09-08 DIAGNOSIS — Z85850 Personal history of malignant neoplasm of thyroid: Secondary | ICD-10-CM | POA: Diagnosis not present

## 2014-09-08 DIAGNOSIS — E669 Obesity, unspecified: Secondary | ICD-10-CM | POA: Diagnosis present

## 2014-09-08 DIAGNOSIS — M1990 Unspecified osteoarthritis, unspecified site: Secondary | ICD-10-CM | POA: Diagnosis present

## 2014-09-08 DIAGNOSIS — E785 Hyperlipidemia, unspecified: Secondary | ICD-10-CM | POA: Diagnosis present

## 2014-09-08 DIAGNOSIS — R609 Edema, unspecified: Secondary | ICD-10-CM | POA: Diagnosis not present

## 2014-09-08 DIAGNOSIS — J45909 Unspecified asthma, uncomplicated: Secondary | ICD-10-CM | POA: Diagnosis present

## 2014-09-08 DIAGNOSIS — J9809 Other diseases of bronchus, not elsewhere classified: Secondary | ICD-10-CM | POA: Diagnosis not present

## 2014-09-08 DIAGNOSIS — M545 Low back pain, unspecified: Secondary | ICD-10-CM | POA: Diagnosis present

## 2014-09-08 DIAGNOSIS — F419 Anxiety disorder, unspecified: Secondary | ICD-10-CM | POA: Diagnosis present

## 2014-09-08 DIAGNOSIS — R05 Cough: Secondary | ICD-10-CM

## 2014-09-08 DIAGNOSIS — G459 Transient cerebral ischemic attack, unspecified: Secondary | ICD-10-CM | POA: Diagnosis present

## 2014-09-08 DIAGNOSIS — F329 Major depressive disorder, single episode, unspecified: Secondary | ICD-10-CM | POA: Diagnosis present

## 2014-09-08 DIAGNOSIS — Z8673 Personal history of transient ischemic attack (TIA), and cerebral infarction without residual deficits: Secondary | ICD-10-CM

## 2014-09-08 DIAGNOSIS — M797 Fibromyalgia: Secondary | ICD-10-CM | POA: Diagnosis present

## 2014-09-08 DIAGNOSIS — E1159 Type 2 diabetes mellitus with other circulatory complications: Secondary | ICD-10-CM | POA: Diagnosis present

## 2014-09-08 DIAGNOSIS — E1165 Type 2 diabetes mellitus with hyperglycemia: Secondary | ICD-10-CM | POA: Diagnosis present

## 2014-09-08 DIAGNOSIS — Z79891 Long term (current) use of opiate analgesic: Secondary | ICD-10-CM

## 2014-09-08 DIAGNOSIS — Z96652 Presence of left artificial knee joint: Secondary | ICD-10-CM | POA: Diagnosis present

## 2014-09-08 DIAGNOSIS — N179 Acute kidney failure, unspecified: Secondary | ICD-10-CM | POA: Diagnosis not present

## 2014-09-08 DIAGNOSIS — Z882 Allergy status to sulfonamides status: Secondary | ICD-10-CM | POA: Diagnosis not present

## 2014-09-08 DIAGNOSIS — Z8719 Personal history of other diseases of the digestive system: Secondary | ICD-10-CM

## 2014-09-08 DIAGNOSIS — Z6839 Body mass index (BMI) 39.0-39.9, adult: Secondary | ICD-10-CM

## 2014-09-08 DIAGNOSIS — G4730 Sleep apnea, unspecified: Secondary | ICD-10-CM | POA: Diagnosis present

## 2014-09-08 LAB — CBC
HCT: 46.1 % — ABNORMAL HIGH (ref 36.0–46.0)
Hemoglobin: 15.3 g/dL — ABNORMAL HIGH (ref 12.0–15.0)
MCH: 26.3 pg (ref 26.0–34.0)
MCHC: 33.2 g/dL (ref 30.0–36.0)
MCV: 79.2 fL (ref 78.0–100.0)
Platelets: 280 10*3/uL (ref 150–400)
RBC: 5.82 MIL/uL — ABNORMAL HIGH (ref 3.87–5.11)
RDW: 13.9 % (ref 11.5–15.5)
WBC: 21.3 10*3/uL — ABNORMAL HIGH (ref 4.0–10.5)

## 2014-09-08 LAB — I-STAT TROPONIN, ED: TROPONIN I, POC: 0 ng/mL (ref 0.00–0.08)

## 2014-09-08 LAB — BASIC METABOLIC PANEL
Anion gap: 14 (ref 5–15)
BUN: 29 mg/dL — ABNORMAL HIGH (ref 6–23)
CALCIUM: 9.9 mg/dL (ref 8.4–10.5)
CHLORIDE: 96 meq/L (ref 96–112)
CO2: 24 mmol/L (ref 19–32)
Creatinine, Ser: 1.66 mg/dL — ABNORMAL HIGH (ref 0.50–1.10)
GFR calc Af Amer: 38 mL/min — ABNORMAL LOW (ref >90)
GFR, EST NON AFRICAN AMERICAN: 33 mL/min — AB (ref >90)
Glucose, Bld: 214 mg/dL — ABNORMAL HIGH (ref 70–99)
Potassium: 4 mmol/L (ref 3.5–5.1)
Sodium: 134 mmol/L — ABNORMAL LOW (ref 135–145)

## 2014-09-08 LAB — GLUCOSE, CAPILLARY: Glucose-Capillary: 172 mg/dL — ABNORMAL HIGH (ref 70–99)

## 2014-09-08 MED ORDER — AEROCHAMBER PLUS W/MASK MISC: Status: DC

## 2014-09-08 MED ORDER — INSULIN ASPART 100 UNIT/ML ~~LOC~~ SOLN
0.0000 [IU] | Freq: Three times a day (TID) | SUBCUTANEOUS | Status: DC
  Administered 2014-09-09: 9 [IU] via SUBCUTANEOUS

## 2014-09-08 MED ORDER — IPRATROPIUM-ALBUTEROL 0.5-2.5 (3) MG/3ML IN SOLN
3.0000 mL | Freq: Once | RESPIRATORY_TRACT | Status: AC
  Administered 2014-09-08: 3 mL via RESPIRATORY_TRACT
  Filled 2014-09-08: qty 3

## 2014-09-08 MED ORDER — ALBUTEROL SULFATE HFA 108 (90 BASE) MCG/ACT IN AERS: 2.0000 | INHALATION_SPRAY | RESPIRATORY_TRACT | Status: DC | PRN

## 2014-09-08 MED ORDER — CLONAZEPAM 0.5 MG PO TABS
0.5000 mg | ORAL_TABLET | ORAL | Status: DC | PRN
  Administered 2014-09-09 – 2014-09-10 (×3): 0.5 mg via ORAL
  Filled 2014-09-08 (×3): qty 1

## 2014-09-08 MED ORDER — HYDRALAZINE HCL 20 MG/ML IJ SOLN: 5.0000 mg | INTRAMUSCULAR | Status: DC | PRN

## 2014-09-08 MED ORDER — AZITHROMYCIN 250 MG PO TABS: 250.0000 mg | ORAL_TABLET | Freq: Every day | ORAL | Status: DC

## 2014-09-08 MED ORDER — LEVOTHYROXINE SODIUM 150 MCG PO TABS
150.0000 ug | ORAL_TABLET | Freq: Every day | ORAL | Status: DC
  Administered 2014-09-09 – 2014-09-11 (×3): 150 ug via ORAL
  Filled 2014-09-08 (×4): qty 1

## 2014-09-08 MED ORDER — GUAIFENESIN-DM 100-10 MG/5ML PO SYRP: 5.0000 mL | ORAL_SOLUTION | ORAL | Status: DC | PRN

## 2014-09-08 MED ORDER — AMLODIPINE BESYLATE 10 MG PO TABS
10.0000 mg | ORAL_TABLET | Freq: Every day | ORAL | Status: DC
  Administered 2014-09-09 – 2014-09-11 (×3): 10 mg via ORAL
  Filled 2014-09-08 (×3): qty 1

## 2014-09-08 MED ORDER — ENSURE COMPLETE PO LIQD
237.0000 mL | Freq: Two times a day (BID) | ORAL | Status: DC
  Administered 2014-09-09: 237 mL via ORAL

## 2014-09-08 MED ORDER — METHYLPREDNISOLONE SODIUM SUCC 125 MG IJ SOLR
60.0000 mg | Freq: Three times a day (TID) | INTRAMUSCULAR | Status: DC
  Administered 2014-09-09: 60 mg via INTRAVENOUS
  Filled 2014-09-08 (×5): qty 0.96

## 2014-09-08 MED ORDER — ACETAMINOPHEN 500 MG PO TABS: 500.0000 mg | ORAL_TABLET | ORAL | Status: DC | PRN

## 2014-09-08 MED ORDER — AMITRIPTYLINE HCL 100 MG PO TABS
100.0000 mg | ORAL_TABLET | Freq: Every day | ORAL | Status: DC
  Administered 2014-09-09 – 2014-09-10 (×3): 100 mg via ORAL
  Filled 2014-09-08 (×4): qty 1

## 2014-09-08 MED ORDER — LORATADINE 10 MG PO TABS
10.0000 mg | ORAL_TABLET | Freq: Every day | ORAL | Status: DC
  Administered 2014-09-09 – 2014-09-11 (×3): 10 mg via ORAL
  Filled 2014-09-08 (×3): qty 1

## 2014-09-08 MED ORDER — PREDNISONE 20 MG PO TABS
60.0000 mg | ORAL_TABLET | Freq: Once | ORAL | Status: AC
  Administered 2014-09-08: 60 mg via ORAL
  Filled 2014-09-08: qty 3

## 2014-09-08 MED ORDER — HEPARIN SODIUM (PORCINE) 5000 UNIT/ML IJ SOLN
5000.0000 [IU] | Freq: Three times a day (TID) | INTRAMUSCULAR | Status: DC
  Administered 2014-09-09 – 2014-09-11 (×8): 5000 [IU] via SUBCUTANEOUS
  Filled 2014-09-08 (×11): qty 1

## 2014-09-08 MED ORDER — PREDNISONE 20 MG PO TABS: ORAL_TABLET | ORAL | Status: DC

## 2014-09-08 MED ORDER — SODIUM CHLORIDE 0.9 % IV BOLUS (SEPSIS)
1000.0000 mL | Freq: Once | INTRAVENOUS | Status: AC
  Administered 2014-09-08: 1000 mL via INTRAVENOUS

## 2014-09-08 MED ORDER — IPRATROPIUM-ALBUTEROL 0.5-2.5 (3) MG/3ML IN SOLN
3.0000 mL | RESPIRATORY_TRACT | Status: DC
  Administered 2014-09-09 (×4): 3 mL via RESPIRATORY_TRACT
  Filled 2014-09-08 (×5): qty 3

## 2014-09-08 MED ORDER — ALBUTEROL SULFATE (2.5 MG/3ML) 0.083% IN NEBU
2.5000 mg | INHALATION_SOLUTION | RESPIRATORY_TRACT | Status: DC | PRN
  Administered 2014-09-09: 2.5 mg via RESPIRATORY_TRACT

## 2014-09-08 MED ORDER — SODIUM CHLORIDE 0.9 % IJ SOLN
3.0000 mL | Freq: Two times a day (BID) | INTRAMUSCULAR | Status: DC
  Administered 2014-09-09 – 2014-09-10 (×5): 3 mL via INTRAVENOUS

## 2014-09-08 MED ORDER — MORPHINE SULFATE ER 60 MG PO TBCR
60.0000 mg | EXTENDED_RELEASE_TABLET | Freq: Two times a day (BID) | ORAL | Status: DC
  Administered 2014-09-09 – 2014-09-11 (×5): 60 mg via ORAL
  Filled 2014-09-08 (×6): qty 1

## 2014-09-08 MED ORDER — INSULIN GLARGINE 100 UNIT/ML ~~LOC~~ SOLN
80.0000 [IU] | Freq: Every day | SUBCUTANEOUS | Status: DC
  Administered 2014-09-09: 80 [IU] via SUBCUTANEOUS
  Filled 2014-09-08: qty 0.8

## 2014-09-08 NOTE — ED Notes (Signed)
Attempted report 

## 2014-09-08 NOTE — ED Notes (Signed)
Pt O2 states dropped from 96% to 83% when ambulating to the bathroom.  Pt stated she became weak and when trying to catch her breath began an intense croupy cough.  Pt 02 stats normalized when pt went back to bed without exertion.

## 2014-09-08 NOTE — H&P (Signed)
[facility] History and Physical  Mary Acosta UYQ:[id number] DOB: [birth year] DOA: 09/08/2014  Referring physician: ED physician PCP: Thressa Sheller, MD  Specialists:   Chief Complaint: Shortness of breath and productive cough.  HPI: Mary Acosta is a 59 y.o. female with past medical history of hypertension, thyroidism, diabetes mellitus, COPD, who presents with shortness of breath and cough.  Patient was recently hospitalized and treated for HCAP from 01/1-08/29/14. She was discharged on prednisone and Levaquin. She completed steroid and antibiotic treatment. She is taking all other medications. She reports that she still has productive cough with greenish colored sputum. She has worsening shortness of breath, particularly when she walks. Has a mild fever, but no chills. No any chest pain. Patient does not have tenderness over the calf area. Of note, patient had negative CTA for PE on 08/26/14.  She also reports having nausea and diarrhea after she took Levaquin. She had 3 bowel movements with loose stools yesterday. She does not have abdominal pain or vomiting. Patient denies chest pain, dysuria, urgency, frequency, hematuria, skin rashes or leg swelling.  Work up in the ED demonstrates negative chest x-ray for acute abnormalities. Troponin negative. AK I. Leukocytosis with WBC 21.3 (patient just completed prednisone a week ago).  Review of Systems: As presented in the history of presenting illness, rest negative.  Where does patient live?  At home Can patient participate in ADLs? Yes  Allergy:  Allergies  Allergen Reactions  . Losartan Other (See Comments)    Myalgias and muscle cramping  . Sulfonamide Derivatives Nausea Only    Past Medical History  Diagnosis Date  . Anxiety     with panic attacks  . Insomnia   . COPD (chronic obstructive pulmonary disease)   . Polymyalgia rheumatica   . Fatty liver disease, nonalcoholic   . Scoliosis   . Asthma   .  Depression   . History of TIA (transient ischemic attack) 11-01-2010    NO RESIDUAL  . Hyperlipidemia   . Lumbar stenosis   . Chronic pain syndrome     [facility]  . Seasonal allergies   . Chronic narcotic use   . Frequency of urination   . Urgency of urination   . Nocturia   . Vaginal pain S/P SLING  FEB 2012  . Hypertension   . OSA (obstructive sleep apnea)     NO CPAP SINCE WT LOSS  . Hypothyroidism   . IDDM (insulin dependent diabetes mellitus)   . Daily headache   . Osteoarthritis     with severe disease in knee  . DJD (degenerative joint disease)   . Arthritis     "back; feet; hands; shoulders" (08/26/2014)  . Fibromyalgia   . Chronic lower back pain   . Thyroid cancer   . Cervical cancer   . Pneumonia "several times"  . HCAP (healthcare-associated pneumonia) 08/26/2014    Past Surgical History  Procedure Laterality Date  . Appendectomy  1982  . Tubal ligation  1983  . Hysteroscopy w/d&c  08-19-2007    PMB  . Laparoscopic gastric banding  03/01/2006    TRUNCAL VAGOTOMY/ PLACEMENT OF VG BAND  . Knee arthroscopy w/ debridement Left 03/29/2006    INTERNAL DERANGEMENT/ SEVERE DJD/ MENISCUS TEARS  . Transvaginal subureteral tape/ sling  09-28-2010    MIXED URINARY INCONTINENCE  . Revision total knee arthroplasty Left 08-29-2008; 05/2009  . Total knee arthroplasty Left 01-23-2007    SEVERE DJD  . Total  thyroidectomy  11-22-2005    BILATERAL THYROID NODULES-- PAPILLARY CARCINOMA (0.5CM)/ ADENOMATOID NODULES  . Breast lumpectomy Left 02-28-2005    ATYPICAL DUCTAL HYPERPLASIA  . Cardiac catheterization  09-04-2004    NORMAL CORONARY ANATOMY/ NORMAL LVF/ EF 60%  . Laparoscopic cholecystectomy  06-10-2002  . Tonsillectomy  1969  . Transthoracic echocardiogram  12-27-2010    LVSF NORMAL / EF 00-17%/ GRADE I DIASTOLIC DYSFUNCTION/ MILD MITRAL REGURG. / MILDLY DILATED LEFT ATRIUM/ MILDY INCREASED SYSTOLIC PRESSURE OF PULMONARY ARTERIES  . Cardiovascular  stress test  12-27-2010  DR [place]    ABNORMAL NUCLEAR STUDY W/ /MILD INFERIOR ISCHEMIA/ EF 69%/  CT HEART ANGIOGRAM ;  NO ACUTE FINDINGS  . Cystoscopy  05/18/2012    Procedure: CYSTOSCOPY;  Surgeon: Reece Packer, MD;  Location: [facility];  Service: Urology;  Laterality: N/A;  examination under anethesia  . Cryoablation  05/16/2003    w/LEEP FOR ABNORMAL PAP SMEAR    Social History:  reports that she quit smoking about 3 years ago. Her smoking use included Cigarettes. She has a 39 pack-year smoking history. She has never used smokeless tobacco. She reports that she does not drink alcohol or use illicit drugs.  Family History:  Family History  Problem Relation Age of Onset  . Colon cancer Maternal Uncle     x 2  . Breast cancer Other     great aunts x 5  . Diabetes Mother   . Heart disease Father   . Heart disease Mother      Prior to Admission medications   Medication Sig Start Date End Date Taking? Authorizing Provider  acetaminophen (TYLENOL) 500 MG tablet Take 500 mg by mouth every 4 (four) hours as needed for moderate pain.   Yes Historical Provider, MD  amitriptyline (ELAVIL) 25 MG tablet Take 100 mg by mouth at bedtime.   Yes Historical Provider, MD  amLODipine (NORVASC) 5 MG tablet Take 1 tablet (5 mg total) by mouth daily. 08/29/14  Yes Marianne L York, PA-C  cetirizine (ZYRTEC) 10 MG tablet Take 10 mg by mouth daily as needed for allergies.    Yes Historical Provider, MD  clonazePAM (KLONOPIN) 0.5 MG tablet Take 0.5 mg by mouth daily as needed for anxiety.   Yes Historical Provider, MD  hydrochlorothiazide (MICROZIDE) 12.5 MG capsule Take 1 capsule (12.5 mg total) by mouth daily. 08/29/14  Yes Marianne L York, PA-C  ibuprofen (ADVIL,MOTRIN) 200 MG tablet Take 200 mg by mouth every 6 (six) hours as needed. For knee pain   Yes Historical Provider, MD  ibuprofen (ADVIL,MOTRIN) 200 MG tablet Take 200 mg by mouth every 6 (six) hours as needed.   Yes Historical  Provider, MD  insulin glargine (LANTUS) 100 UNIT/ML injection Inject 0.8 mLs (80 Units total) into the skin daily. 08/29/14  Yes Marianne L York, PA-C  insulin lispro (HUMALOG) 100 UNIT/ML injection Inject 0.1 mLs (10 Units total) into the skin 3 (three) times daily before meals. 08/29/14  Yes Bobby Rumpf York, PA-C  levofloxacin (LEVAQUIN) 750 MG tablet Take 1 tablet (750 mg total) by mouth daily. 08/29/14  Yes Bobby Rumpf York, PA-C  levothyroxine (SYNTHROID, LEVOTHROID) 150 MCG tablet Take 150 mcg by mouth daily before breakfast.   Yes Historical Provider, MD  lisinopril (PRINIVIL,ZESTRIL) 10 MG tablet Take 1 tablet (10 mg total) by mouth daily. 08/29/14  Yes Bobby Rumpf York, PA-C  meloxicam (MOBIC) 7.5 MG tablet Take 7.5 mg by mouth 2 (two) times daily.  Yes Historical Provider, MD  morphine (MS CONTIN) 60 MG 12 hr tablet Take 60 mg by mouth every 12 (twelve) hours.   Yes Historical Provider, MD  polyethylene glycol (MIRALAX / GLYCOLAX) packet Take 17 g by mouth 2 (two) times daily as needed for moderate constipation. 08/29/14  Yes Marianne L York, PA-C  potassium chloride SA (K-DUR,KLOR-CON) 20 MEQ tablet Take 20 mEq by mouth as needed (for cramping).  05/30/14  Yes Historical Provider, MD  albuterol (PROVENTIL HFA;VENTOLIN HFA) 108 (90 BASE) MCG/ACT inhaler Inhale 2 puffs into the lungs every 4 (four) hours as needed for wheezing or shortness of breath. 09/08/14   Pura Spice, PA-C  azithromycin (ZITHROMAX) 250 MG tablet Take 1 tablet (250 mg total) by mouth daily. Take first 2 tablets together, then 1 every day until finished. 09/08/14   Pura Spice, PA-C  predniSONE (DELTASONE) 10 MG tablet Take 2 tablets with breakfast for two mornings.  On the third and fourth mornings take 1 tablet with breakfast.  Then stop. 08/29/14   Melton Alar, PA-C  predniSONE (DELTASONE) 20 MG tablet 3 tabs po daily x 2 days, then 2 tabs x 2 days, then 1.5 tabs x 2 days, then 1 tab x 2 days, then 0.5 tabs x 2 days  09/08/14   Pura Spice, PA-C  Spacer/Aero-Holding Chambers (AEROCHAMBER PLUS WITH MASK) inhaler Use as instructed 09/08/14   Pura Spice, PA-C    Physical Exam: Filed Vitals:   09/08/14 2213 09/08/14 2300 09/08/14 2328 09/08/14 2338  BP: 125/83  139/81   Pulse: 104  100   Temp: 98.7 F (37.1 C)  99 F (37.2 C)   TempSrc: Oral  Oral   Resp: 22  20   Height:  5\' 9"  (1.753 m)    Weight:  124 kg (273 lb 5.9 oz)    SpO2: 98%  86% 98%   General: Not in acute distress HEENT:       Eyes: PERRL, EOMI, no scleral icterus       ENT: No discharge from the ears and nose, no pharynx injection, no tonsillar enlargement.        Neck: No JVD, no bruit, no mass felt. Cardiac: S1/S2, RRR, No murmurs, No gallops or rubs Pulm: Diffuse wheezing and bronchitis bilaterally. Abd: Soft, nondistended, nontender, no rebound pain, no organomegaly, BS present Ext: No edema bilaterally. 2+DP/PT pulse bilaterally Musculoskeletal: No joint deformities, erythema, or stiffness, ROM full Skin: No rashes.  Neuro: Alert and oriented X3, cranial nerves II-XII grossly intact, muscle strength 5/5 in all extremeties, sensation to light touch intact. Psych: Patient is not psychotic, no suicidal or hemocidal ideation.  Labs on Admission:  Basic Metabolic Panel:  Recent Labs Lab 09/08/14 1618  NA 134*  K 4.0  CL 96  CO2 24  GLUCOSE 214*  BUN 29*  CREATININE 1.66*  CALCIUM 9.9   Liver Function Tests: No results for input(s): AST, ALT, ALKPHOS, BILITOT, PROT, ALBUMIN in the last 168 hours. No results for input(s): LIPASE, AMYLASE in the last 168 hours. No results for input(s): AMMONIA in the last 168 hours. CBC:  Recent Labs Lab 09/08/14 1618  WBC 21.3*  HGB 15.3*  HCT 46.1*  MCV 79.2  PLT 280   Cardiac Enzymes: No results for input(s): CKTOTAL, CKMB, CKMBINDEX, TROPONINI in the last 168 hours.  BNP (last 3 results) No results for input(s): PROBNP in the last 8760 hours. CBG:  Recent  Labs Lab 09/08/14 2355  GLUCAP 172*    Radiological Exams on Admission: Dg Chest 2 View  09/08/2014   CLINICAL DATA:  Acute shortness of breath, fatigue and cough. Initial encounter.  EXAM: CHEST  2 VIEW  COMPARISON:  08/26/2004 and prior chest radiographs  FINDINGS: The cardiomediastinal silhouette is unremarkable.  Mild peribronchial thickening is unchanged.  There is no evidence of focal airspace disease, pulmonary edema, suspicious pulmonary nodule/mass, pleural effusion, or pneumothorax.  No acute bony abnormalities are identified.  IMPRESSION: No evidence of acute cardiopulmonary disease.   Electronically Signed   By: Hassan Rowan M.D.   On: 09/08/2014 18:09    EKG: Independently reviewed.   Assessment/Plan Principal Problem:   SOB (shortness of breath) Active Problems:   Diabetes 1.5, managed as type 2   HLD (hyperlipidemia)   TOBACCO USER   Essential hypertension   LOW BACK PAIN, CHRONIC   Sleep apnea   Hypothyroid   TIA (transient ischemic attack)   Diarrhea   History of gastroesophageal reflux (GERD)   COPD (chronic obstructive pulmonary disease)   AKI (acute kidney injury)  Shortness of breath: This is most likely caused by COPD exacerbation. Patient has diffused wheezing and rhonchi on auscultation, which is consistent with a COPD exacerbation. She just completed antibiotics for HCAP.   -will admit patient to telemetry bed  -Nebulizers: DuoNeb and albuterol  -Solu-Medrol 60 mg IV q8h -azithromycin for 5 days.  -Robitussin for cough  -Blood and sputum culture  -Urine drug screen   Diarrhea: Unclear etiology. Need to rule out C. difficile given recent antibiotics use. -c diff -IVF: NS 75 cc/h  Hypertension: Patient is on lisinopril, HCTZ, and amlodipine -We'll hold HCTZ, lisinopril due to AKI -Increase amlodipine dose from 5-10 mg daily -Hydralazine when necessary  AKI: Creatinine is 1.66 which was 0.95 on 08/28/14. Likely due to prerenal. -Hold ibuprofen,  lisinopril, mobile, HCTZ -IV fluid: Normal saline 75 mL per hour -check FeUrea and US-renal  Diabetes mellitus: A1c was 10.3 on 07/09/14. Patient is on Lantus at home. -Continue Lantus 80 units daily -Sliding-scale insulin.  Hypothyroidism: TSH was 3.3 on 08/28/14. Patient is on Synthroid at home. -Continue Synthroid.   DVT ppx: SQ Heparin     Code Status: Full code Family Communication: None at bed side.   Disposition Plan: Admit to inpatient   Date of Service 09/09/2014    Ivor Costa [facility] Pager [phone]  If 7PM-7AM, please contact night-coverage [email or web address] Password [facility] 09/09/2014, 12:37 AM

## 2014-09-08 NOTE — ED Notes (Signed)
Gave pt ice water °

## 2014-09-08 NOTE — Discharge Instructions (Signed)
Return to the emergency room with worsening of symptoms, new symptoms or with symptoms that are concerning , especially fevers, stiff neck, worsening headache, nausea/vomiting, visual changes or slurred speech, chest pain, shortness of breath, cough with thick colored mucous or blood Drink plenty of fluids with electrolytes especially Gatorade. OTC cold medications such as mucinex, nyquil, dayquil are recommended. Chloraseptic for sore throat. Please take all of your antibiotics until finished!   You may develop abdominal discomfort or diarrhea from the antibiotic.  You may help offset this with probiotics which you can buy or get in yogurt. Do not eat  or take the probiotics until 2 hours after your antibiotic.   Please call your doctor for a followup appointment within 24-48 hours. When you talk to your doctor please let them know that you were seen in the emergency department and have them acquire all of your records so that they can discuss the findings with you and formulate a treatment plan to fully care for your new and ongoing problems.   Shortness of Breath Shortness of breath means you have trouble breathing. It could also mean that you have a medical problem. You should get immediate medical care for shortness of breath. CAUSES   Not enough oxygen in the air such as with high altitudes or a smoke-filled room.  Certain lung diseases, infections, or problems.  Heart disease or conditions, such as angina or heart failure.  Low red blood cells (anemia).  Poor physical fitness, which can cause shortness of breath when you exercise.  Chest or back injuries or stiffness.  Being overweight.  Smoking.  Anxiety, which can make you feel like you are not getting enough air. DIAGNOSIS  Serious medical problems can often be found during your physical exam. Tests may also be done to determine why you are having shortness of breath. Tests may include:  Chest X-rays.  Lung function  tests.  Blood tests.  An electrocardiogram (ECG).  An ambulatory electrocardiogram. An ambulatory ECG records your heartbeat patterns over a 24-hour period.  Exercise testing.  A transthoracic echocardiogram (TTE). During echocardiography, sound waves are used to evaluate how blood flows through your heart.  A transesophageal echocardiogram (TEE).  Imaging scans. Your health care provider may not be able to find a cause for your shortness of breath after your exam. In this case, it is important to have a follow-up exam with your health care provider as directed.  TREATMENT  Treatment for shortness of breath depends on the cause of your symptoms and can vary greatly. HOME CARE INSTRUCTIONS   Do not smoke. Smoking is a common cause of shortness of breath. If you smoke, ask for help to quit.  Avoid being around chemicals or things that may bother your breathing, such as paint fumes and dust.  Rest as needed. Slowly resume your usual activities.  If medicines were prescribed, take them as directed for the full length of time directed. This includes oxygen and any inhaled medicines.  Keep all follow-up appointments as directed by your health care provider. SEEK MEDICAL CARE IF:   Your condition does not improve in the time expected.  You have a hard time doing your normal activities even with rest.  You have any new symptoms. SEEK IMMEDIATE MEDICAL CARE IF:   Your shortness of breath gets worse.  You feel light-headed, faint, or develop a cough not controlled with medicines.  You start coughing up blood.  You have pain with breathing.  You have chest  pain or pain in your arms, shoulders, or abdomen.  You have a fever.  You are unable to walk up stairs or exercise the way you normally do. MAKE SURE YOU:  Understand these instructions.  Will watch your condition.  Will get help right away if you are not doing well or get worse. Document Released: 05/07/2001 Document  Revised: 08/17/2013 Document Reviewed: 10/28/2011 [facility] Patient Information 2015 [place], [place]. This information is not intended to replace advice given to you by your health care provider. Make sure you discuss any questions you have with your health care provider.   Prednisone tablets What is this medicine? PREDNISONE (PRED ni sone) is a corticosteroid. It is commonly used to treat inflammation of the skin, joints, lungs, and other organs. Common conditions treated include asthma, allergies, and arthritis. It is also used for other conditions, such as blood disorders and diseases of the adrenal glands. This medicine may be used for other purposes; ask your health care provider or pharmacist if you have questions. COMMON BRAND NAME(S): Deltasone, Predone, Sterapred, Sterapred DS What should I tell my health care provider before I take this medicine? They need to know if you have any of these conditions: -Cushing's syndrome -diabetes -glaucoma -heart disease -high blood pressure -infection (especially a virus infection such as chickenpox, cold sores, or herpes) -kidney disease -liver disease -mental illness -myasthenia gravis -osteoporosis -seizures -stomach or intestine problems -thyroid disease -an unusual or allergic reaction to lactose, prednisone, other medicines, foods, dyes, or preservatives -pregnant or trying to get pregnant -breast-feeding How should I use this medicine? Take this medicine by mouth with a glass of water. Follow the directions on the prescription label. Take this medicine with food. If you are taking this medicine once a day, take it in the morning. Do not take more medicine than you are told to take. Do not suddenly stop taking your medicine because you may develop a severe reaction. Your doctor will tell you how much medicine to take. If your doctor wants you to stop the medicine, the dose may be slowly lowered over time to avoid any side effects. Talk  to your pediatrician regarding the use of this medicine in children. Special care may be needed. Overdosage: If you think you have taken too much of this medicine contact a poison control center or emergency room at once. NOTE: This medicine is only for you. Do not share this medicine with others. What if I miss a dose? If you miss a dose, take it as soon as you can. If it is almost time for your next dose, talk to your doctor or health care professional. You may need to miss a dose or take an extra dose. Do not take double or extra doses without advice. What may interact with this medicine? Do not take this medicine with any of the following medications: -metyrapone -mifepristone This medicine may also interact with the following medications: -aminoglutethimide -amphotericin B -aspirin and aspirin-like medicines -barbiturates -certain medicines for diabetes, like glipizide or glyburide -cholestyramine -cholinesterase inhibitors -cyclosporine -digoxin -diuretics -ephedrine -female hormones, like estrogens and birth control pills -isoniazid -ketoconazole -NSAIDS, medicines for pain and inflammation, like ibuprofen or naproxen -phenytoin -rifampin -toxoids -vaccines -warfarin This list may not describe all possible interactions. Give your health care provider a list of all the medicines, herbs, non-prescription drugs, or dietary supplements you use. Also tell them if you smoke, drink alcohol, or use illegal drugs. Some items may interact with your medicine. What should I watch  for while using this medicine? Visit your doctor or health care professional for regular checks on your progress. If you are taking this medicine over a prolonged period, carry an identification card with your name and address, the type and dose of your medicine, and your doctor's name and address. This medicine may increase your risk of getting an infection. Tell your doctor or health care professional if you  are around anyone with measles or chickenpox, or if you develop sores or blisters that do not heal properly. If you are going to have surgery, tell your doctor or health care professional that you have taken this medicine within the last twelve months. Ask your doctor or health care professional about your diet. You may need to lower the amount of salt you eat. This medicine may affect blood sugar levels. If you have diabetes, check with your doctor or health care professional before you change your diet or the dose of your diabetic medicine. What side effects may I notice from receiving this medicine? Side effects that you should report to your doctor or health care professional as soon as possible: -allergic reactions like skin rash, itching or hives, swelling of the face, lips, or tongue -changes in emotions or moods -changes in vision -depressed mood -eye pain -fever or chills, cough, sore throat, pain or difficulty passing urine -increased thirst -swelling of ankles, feet Side effects that usually do not require medical attention (report to your doctor or health care professional if they continue or are bothersome): -confusion, excitement, restlessness -headache -nausea, vomiting -skin problems, acne, thin and shiny skin -trouble sleeping -weight gain This list may not describe all possible side effects. Call your doctor for medical advice about side effects. You may report side effects to FDA at 1-800-FDA-1088. Where should I keep my medicine? Keep out of the reach of children. Store at room temperature between 15 and 30 degrees C (59 and 86 degrees F). Protect from light. Keep container tightly closed. Throw away any unused medicine after the expiration date. NOTE: This sheet is a summary. It may not cover all possible information. If you have questions about this medicine, talk to your doctor, pharmacist, or health care provider.  2015, [employer]. (2011-03-28  10:57:14)

## 2014-09-08 NOTE — ED Provider Notes (Signed)
CSN: [id number]     Arrival date & time 09/08/14  1557 History   First MD Initiated Contact with Patient 09/08/14 1731     Chief Complaint  Patient presents with  . Shortness of Breath     (Consider location/radiation/quality/duration/timing/severity/associated sxs/prior Treatment) HPI  Mary Acosta is a 59 y.o. female with PMH of pneumonia, COPD, hypertension, anxiety, depression, chronic pain presenting with persistent shortness of breath and generalized weakness after admission for hospital-acquired pneumonia. She denies worsening of the symptoms although they have been persistent. She denies any improvement. She denies shortness of breath, fevers. She states she still has a productive cough of thick greenish mucus. Patient stated she took her prednisone and antibiotic as prescribed. She has been off the prednisone for 1 week. She has been taking her home inhalers without improvement. Patient denies leg swelling.    Past Medical History  Diagnosis Date  . Anxiety     with panic attacks  . Insomnia   . COPD (chronic obstructive pulmonary disease)   . Polymyalgia rheumatica   . Fatty liver disease, nonalcoholic   . Scoliosis   . Asthma   . Depression   . History of TIA (transient ischemic attack) 11-01-2010    NO RESIDUAL  . Hyperlipidemia   . Lumbar stenosis   . Chronic pain syndrome     [facility]  . Seasonal allergies   . Chronic narcotic use   . Frequency of urination   . Urgency of urination   . Nocturia   . Vaginal pain S/P SLING  FEB 2012  . Hypertension   . OSA (obstructive sleep apnea)     NO CPAP SINCE WT LOSS  . Hypothyroidism   . IDDM (insulin dependent diabetes mellitus)   . Daily headache   . Osteoarthritis     with severe disease in knee  . DJD (degenerative joint disease)   . Arthritis     "back; feet; hands; shoulders" (08/26/2014)  . Fibromyalgia   . Chronic lower back pain   . Thyroid cancer   . Cervical cancer   .  Pneumonia "several times"  . HCAP (healthcare-associated pneumonia) 08/26/2014   Past Surgical History  Procedure Laterality Date  . Appendectomy  1982  . Tubal ligation  1983  . Hysteroscopy w/d&c  08-19-2007; 05-16-2003    2008 PMB; D&C w/ CRYOABLATION & LEEP FOR ABNORMAL PAP SMEAR  . Laparoscopic gastric banding  03/01/2006    TRUNCAL VAGOTOMY/ PLACEMENT OF VG BAND  . Knee arthroscopy w/ debridement Left 03/29/2006    INTERNAL DERANGEMENT/ SEVERE DJD/ MENISCUS TEARS  . Transvaginal subureteral tape/ sling  09-28-2010    MIXED URINARY INCONTINENCE  . Revision total knee arthroplasty Left 08-29-2008; 05/2009  . Total knee arthroplasty Left 01-23-2007    SEVERE DJD  . Total thyroidectomy  11-22-2005    BILATERAL THYROID NODULES-- PAPILLARY CARCINOMA (0.5CM)/ ADENOMATOID NODULES  . Breast lumpectomy Left 02-28-2005    ATYPICAL DUCTAL HYPERPLASIA  . Cardiac catheterization  09-04-2004    NORMAL CORONARY ANATOMY/ NORMAL LVF/ EF 60%  . Laparoscopic cholecystectomy  06-10-2002  . Tonsillectomy  1969  . Transthoracic echocardiogram  12-27-2010    LVSF NORMAL / EF 03-50%/ GRADE I DIASTOLIC DYSFUNCTION/ MILD MITRAL REGURG. / MILDLY DILATED LEFT ATRIUM/ MILDY INCREASED SYSTOLIC PRESSURE OF PULMONARY ARTERIES  . Cardiovascular stress test  12-27-2010  DR [place]    ABNORMAL NUCLEAR STUDY W/ /MILD INFERIOR ISCHEMIA/ EF 69%/  CT HEART ANGIOGRAM ;  NO ACUTE FINDINGS  . Cystoscopy  05/18/2012    Procedure: CYSTOSCOPY;  Surgeon: Reece Packer, MD;  Location: [facility];  Service: Urology;  Laterality: N/A;  examination under anethesia  . Cryoablation  05/16/2003    w/LEEP FOR ABNORMAL PAP SMEAR   Family History  Problem Relation Age of Onset  . Colon cancer Maternal Uncle     x 2  . Breast cancer Other     great aunts x 5  . Diabetes Mother   . Heart disease Father   . Heart disease Mother    History  Substance Use Topics  . Smoking status: Former Smoker -- 1.00  packs/day for 39 years    Types: Cigarettes    Quit date: 10/22/2010  . Smokeless tobacco: Never Used  . Alcohol Use: No   OB History    No data available     Review of Systems 10 Systems reviewed and are negative for acute change except as noted in the HPI.    Allergies  Losartan and Sulfonamide derivatives  Home Medications   Prior to Admission medications   Medication Sig Start Date End Date Taking? Authorizing Provider  acetaminophen (TYLENOL) 500 MG tablet Take 500 mg by mouth every 4 (four) hours as needed for moderate pain.   Yes Historical Provider, MD  amitriptyline (ELAVIL) 25 MG tablet Take 100 mg by mouth at bedtime.   Yes Historical Provider, MD  amLODipine (NORVASC) 5 MG tablet Take 1 tablet (5 mg total) by mouth daily. 08/29/14  Yes Marianne L York, PA-C  cetirizine (ZYRTEC) 10 MG tablet Take 10 mg by mouth daily as needed for allergies.    Yes Historical Provider, MD  clonazePAM (KLONOPIN) 0.5 MG tablet Take 0.5 mg by mouth daily as needed for anxiety.   Yes Historical Provider, MD  hydrochlorothiazide (MICROZIDE) 12.5 MG capsule Take 1 capsule (12.5 mg total) by mouth daily. 08/29/14  Yes Marianne L York, PA-C  ibuprofen (ADVIL,MOTRIN) 200 MG tablet Take 200 mg by mouth every 6 (six) hours as needed. For knee pain   Yes Historical Provider, MD  ibuprofen (ADVIL,MOTRIN) 200 MG tablet Take 200 mg by mouth every 6 (six) hours as needed.   Yes Historical Provider, MD  insulin glargine (LANTUS) 100 UNIT/ML injection Inject 0.8 mLs (80 Units total) into the skin daily. 08/29/14  Yes Marianne L York, PA-C  insulin lispro (HUMALOG) 100 UNIT/ML injection Inject 0.1 mLs (10 Units total) into the skin 3 (three) times daily before meals. 08/29/14  Yes Bobby Rumpf York, PA-C  levofloxacin (LEVAQUIN) 750 MG tablet Take 1 tablet (750 mg total) by mouth daily. 08/29/14  Yes Bobby Rumpf York, PA-C  levothyroxine (SYNTHROID, LEVOTHROID) 150 MCG tablet Take 150 mcg by mouth daily before breakfast.    Yes Historical Provider, MD  lisinopril (PRINIVIL,ZESTRIL) 10 MG tablet Take 1 tablet (10 mg total) by mouth daily. 08/29/14  Yes Bobby Rumpf York, PA-C  meloxicam (MOBIC) 7.5 MG tablet Take 7.5 mg by mouth 2 (two) times daily.    Yes Historical Provider, MD  morphine (MS CONTIN) 60 MG 12 hr tablet Take 60 mg by mouth every 12 (twelve) hours.   Yes Historical Provider, MD  polyethylene glycol (MIRALAX / GLYCOLAX) packet Take 17 g by mouth 2 (two) times daily as needed for moderate constipation. 08/29/14  Yes Marianne L York, PA-C  potassium chloride SA (K-DUR,KLOR-CON) 20 MEQ tablet Take 20 mEq by mouth as needed (for cramping).  05/30/14  Yes  Historical Provider, MD  albuterol (PROVENTIL HFA;VENTOLIN HFA) 108 (90 BASE) MCG/ACT inhaler Inhale 2 puffs into the lungs every 4 (four) hours as needed for wheezing or shortness of breath. 09/08/14   Pura Spice, PA-C  azithromycin (ZITHROMAX) 250 MG tablet Take 1 tablet (250 mg total) by mouth daily. Take first 2 tablets together, then 1 every day until finished. 09/08/14   Pura Spice, PA-C  predniSONE (DELTASONE) 10 MG tablet Take 2 tablets with breakfast for two mornings.  On the third and fourth mornings take 1 tablet with breakfast.  Then stop. 08/29/14   Melton Alar, PA-C  predniSONE (DELTASONE) 20 MG tablet 3 tabs po daily x 2 days, then 2 tabs x 2 days, then 1.5 tabs x 2 days, then 1 tab x 2 days, then 0.5 tabs x 2 days 09/08/14   Pura Spice, PA-C  Spacer/Aero-Holding Chambers (AEROCHAMBER PLUS WITH MASK) inhaler Use as instructed 09/08/14   Pura Spice, PA-C   BP 125/83 mmHg  Pulse 104  Temp(Src) 98.7 F (37.1 C) (Oral)  Resp 22  SpO2 98% Physical Exam  Constitutional: She appears well-developed and well-nourished. No distress.  HENT:  Head: Normocephalic and atraumatic.  Eyes: Conjunctivae and EOM are normal. Right eye exhibits no discharge. Left eye exhibits no discharge.  Neck: No JVD present.  Cardiovascular: Normal  rate and regular rhythm.   No leg swelling or tenderness. Negative Homan's sign.  Pulmonary/Chest: Effort normal.  Mild diffuse wheezing bilaterally. No Respiratory distress.  Abdominal: Soft. Bowel sounds are normal. She exhibits no distension. There is no tenderness.  Neurological: She is alert. She exhibits normal muscle tone. Coordination normal.  Skin: Skin is warm and dry. She is not diaphoretic.  Nursing note and vitals reviewed.   ED Course  Procedures (including critical care time) Labs Review Labs Reviewed  CBC - Abnormal; Notable for the following:    WBC 21.3 (*)    RBC 5.82 (*)    Hemoglobin 15.3 (*)    HCT 46.1 (*)    All other components within normal limits  BASIC METABOLIC PANEL - Abnormal; Notable for the following:    Sodium 134 (*)    Glucose, Bld 214 (*)    BUN 29 (*)    Creatinine, Ser 1.66 (*)    GFR calc non Af Amer 33 (*)    GFR calc Af Amer 38 (*)    All other components within normal limits  I-STAT TROPOININ, ED    Imaging Review Dg Chest 2 View  09/08/2014   CLINICAL DATA:  Acute shortness of breath, fatigue and cough. Initial encounter.  EXAM: CHEST  2 VIEW  COMPARISON:  08/26/2004 and prior chest radiographs  FINDINGS: The cardiomediastinal silhouette is unremarkable.  Mild peribronchial thickening is unchanged.  There is no evidence of focal airspace disease, pulmonary edema, suspicious pulmonary nodule/mass, pleural effusion, or pneumothorax.  No acute bony abnormalities are identified.  IMPRESSION: No evidence of acute cardiopulmonary disease.   Electronically Signed   By: Hassan Rowan M.D.   On: 09/08/2014 18:09     EKG Interpretation   Date/Time:  Thursday September 08 2014 16:07:48 EST Ventricular Rate:  116 PR Interval:  162 QRS Duration: 128 QT Interval:  330 QTC Calculation: 458 R Axis:   72 Text Interpretation:  Sinus tachycardia Non-specific intra-ventricular  conduction block Possible Lateral infarct , age undetermined Cannot rule   out Inferior infarct , age undetermined Abnormal ECG No significant change  since  last tracing Confirmed by Debby Freiberg [id number]) on 09/08/2014  6:59:57 PM      MDM   Final diagnoses:  Cough  Shortness of breath  AKI (acute kidney injury)  Leukocytosis   Patient with history of hospital associated pneumonia with discharge 10 days ago presenting with persistent shortness of breath. Patient tachycardic without hypoxia on admission. Patient afebrile. Patient does not appear volume overloaded. Patient with wheezing on exam. Patient with leukocytosis of 21.3. Patient also with acute kidney injury. She was given 2 L normal saline and breathing treatment, prednisone with improvement of her symptoms. Chest x-ray without evidence of acute cardiopulmonary disease. Troponin negative and EKG without acute abnormalities. However patient ambulated in ED and oxygen stats dropped to 83%. Consult to hospitalist. Spoke with Dr. Blaine Hamper who agrees with admission.  Discussed all results and patient verbalizes understanding and agrees with plan.  This is a shared patient. This patient was discussed with the physician, Dr. Colin Rhein who saw and evaluated the patient and agrees with the plan.     Pura Spice, PA-C 09/08/14 2318  Debby Freiberg, MD 09/09/14 [phone]

## 2014-09-08 NOTE — ED Notes (Signed)
Pt in c/o continued shortness of breath and fatigue, recently admitted for pneumonia and states symptoms have not improved, no distress noted

## 2014-09-08 NOTE — ED Notes (Addendum)
Pt ambulated to the bathroom.  

## 2014-09-09 ENCOUNTER — Other Ambulatory Visit (HOSPITAL_COMMUNITY): Payer: Medicare Other

## 2014-09-09 ENCOUNTER — Inpatient Hospital Stay (HOSPITAL_COMMUNITY): Payer: Medicare Other

## 2014-09-09 DIAGNOSIS — R197 Diarrhea, unspecified: Secondary | ICD-10-CM

## 2014-09-09 LAB — RAPID URINE DRUG SCREEN, HOSP PERFORMED
Amphetamines: NOT DETECTED
BARBITURATES: NOT DETECTED
BENZODIAZEPINES: NOT DETECTED
Cocaine: NOT DETECTED
Opiates: NOT DETECTED
Tetrahydrocannabinol: NOT DETECTED

## 2014-09-09 LAB — UREA NITROGEN, URINE: UREA NITROGEN UR: 593 mg/dL

## 2014-09-09 LAB — COMPREHENSIVE METABOLIC PANEL
ALT: 17 U/L (ref 0–35)
ANION GAP: 10 (ref 5–15)
AST: 17 U/L (ref 0–37)
Albumin: 3.4 g/dL — ABNORMAL LOW (ref 3.5–5.2)
Alkaline Phosphatase: 104 U/L (ref 39–117)
BILIRUBIN TOTAL: 1.1 mg/dL (ref 0.3–1.2)
BUN: 23 mg/dL (ref 6–23)
CO2: 24 mmol/L (ref 19–32)
Calcium: 8.9 mg/dL (ref 8.4–10.5)
Chloride: 99 mEq/L (ref 96–112)
Creatinine, Ser: 1.35 mg/dL — ABNORMAL HIGH (ref 0.50–1.10)
GFR calc non Af Amer: 42 mL/min — ABNORMAL LOW (ref >90)
GFR, EST AFRICAN AMERICAN: 49 mL/min — AB (ref >90)
GLUCOSE: 310 mg/dL — AB (ref 70–99)
POTASSIUM: 4.7 mmol/L (ref 3.5–5.1)
SODIUM: 133 mmol/L — AB (ref 135–145)
Total Protein: 7.1 g/dL (ref 6.0–8.3)

## 2014-09-09 LAB — CREATININE, URINE, RANDOM: CREATININE, URINE: 75.15 mg/dL

## 2014-09-09 LAB — URINALYSIS, ROUTINE W REFLEX MICROSCOPIC
Bilirubin Urine: NEGATIVE
Glucose, UA: >1000 mg/dL — AB
Hgb urine dipstick: NEGATIVE
Ketones, ur: NEGATIVE mg/dL
Leukocytes, UA: NEGATIVE
Nitrite: NEGATIVE
PROTEIN: NEGATIVE mg/dL
SPECIFIC GRAVITY, URINE: 1.033 — AB (ref 1.005–1.030)
Urobilinogen, UA: 0.2 mg/dL (ref 0.0–1.0)
pH: 5 (ref 5.0–8.0)

## 2014-09-09 LAB — CBC
HEMATOCRIT: 38.5 % (ref 36.0–46.0)
Hemoglobin: 12.6 g/dL (ref 12.0–15.0)
MCH: 26 pg (ref 26.0–34.0)
MCHC: 32.7 g/dL (ref 30.0–36.0)
MCV: 79.5 fL (ref 78.0–100.0)
Platelets: 211 10*3/uL (ref 150–400)
RBC: 4.84 MIL/uL (ref 3.87–5.11)
RDW: 13.8 % (ref 11.5–15.5)
WBC: 17.9 10*3/uL — AB (ref 4.0–10.5)

## 2014-09-09 LAB — GLUCOSE, CAPILLARY
GLUCOSE-CAPILLARY: 501 mg/dL — AB (ref 70–99)
GLUCOSE-CAPILLARY: 500 mg/dL — AB (ref 70–99)
GLUCOSE-CAPILLARY: 214 mg/dL — AB (ref 70–99)
Glucose-Capillary: 367 mg/dL — ABNORMAL HIGH (ref 70–99)
Glucose-Capillary: 373 mg/dL — ABNORMAL HIGH (ref 70–99)

## 2014-09-09 LAB — URINE MICROSCOPIC-ADD ON

## 2014-09-09 LAB — PROTIME-INR
INR: 1.22 (ref 0.00–1.49)
PROTHROMBIN TIME: 15.5 s — AB (ref 11.6–15.2)

## 2014-09-09 LAB — GLUCOSE, RANDOM: Glucose, Bld: 605 mg/dL (ref 70–99)

## 2014-09-09 LAB — BRAIN NATRIURETIC PEPTIDE: B Natriuretic Peptide: 49.3 pg/mL (ref 0.0–100.0)

## 2014-09-09 MED ORDER — INSULIN ASPART 100 UNIT/ML ~~LOC~~ SOLN
12.0000 [IU] | Freq: Three times a day (TID) | SUBCUTANEOUS | Status: DC
  Administered 2014-09-09 – 2014-09-10 (×2): 12 [IU] via SUBCUTANEOUS

## 2014-09-09 MED ORDER — INSULIN ASPART 100 UNIT/ML ~~LOC~~ SOLN
0.0000 [IU] | Freq: Three times a day (TID) | SUBCUTANEOUS | Status: DC
  Administered 2014-09-09: 20 [IU] via SUBCUTANEOUS
  Administered 2014-09-10: 11 [IU] via SUBCUTANEOUS
  Administered 2014-09-10: 4 [IU] via SUBCUTANEOUS
  Administered 2014-09-10: 7 [IU] via SUBCUTANEOUS

## 2014-09-09 MED ORDER — CETYLPYRIDINIUM CHLORIDE 0.05 % MT LIQD
7.0000 mL | Freq: Two times a day (BID) | OROMUCOSAL | Status: DC
  Administered 2014-09-09: 7 mL via OROMUCOSAL

## 2014-09-09 MED ORDER — GLUCERNA SHAKE PO LIQD
237.0000 mL | Freq: Two times a day (BID) | ORAL | Status: DC
  Administered 2014-09-09 – 2014-09-11 (×2): 237 mL via ORAL
  Filled 2014-09-09: qty 237

## 2014-09-09 MED ORDER — METHYLPREDNISOLONE SODIUM SUCC 40 MG IJ SOLR
40.0000 mg | Freq: Two times a day (BID) | INTRAMUSCULAR | Status: DC
  Administered 2014-09-09 – 2014-09-10 (×2): 40 mg via INTRAVENOUS
  Filled 2014-09-09 (×4): qty 1

## 2014-09-09 MED ORDER — INSULIN ASPART 100 UNIT/ML ~~LOC~~ SOLN
20.0000 [IU] | Freq: Once | SUBCUTANEOUS | Status: AC
  Administered 2014-09-09: 20 [IU] via SUBCUTANEOUS

## 2014-09-09 MED ORDER — AZITHROMYCIN 500 MG PO TABS
500.0000 mg | ORAL_TABLET | Freq: Every day | ORAL | Status: AC
Start: 2014-09-09 — End: 2014-09-09
  Administered 2014-09-09: 500 mg via ORAL
  Filled 2014-09-09: qty 1

## 2014-09-09 MED ORDER — AZITHROMYCIN 250 MG PO TABS
250.0000 mg | ORAL_TABLET | Freq: Every day | ORAL | Status: DC
  Administered 2014-09-09 – 2014-09-11 (×3): 250 mg via ORAL
  Filled 2014-09-09 (×3): qty 1

## 2014-09-09 MED ORDER — IPRATROPIUM-ALBUTEROL 0.5-2.5 (3) MG/3ML IN SOLN
3.0000 mL | Freq: Three times a day (TID) | RESPIRATORY_TRACT | Status: DC
  Administered 2014-09-10 – 2014-09-11 (×4): 3 mL via RESPIRATORY_TRACT
  Filled 2014-09-09 (×5): qty 3

## 2014-09-09 MED ORDER — SACCHAROMYCES BOULARDII 250 MG PO CAPS
250.0000 mg | ORAL_CAPSULE | Freq: Two times a day (BID) | ORAL | Status: DC
  Administered 2014-09-09 – 2014-09-11 (×5): 250 mg via ORAL
  Filled 2014-09-09 (×6): qty 1

## 2014-09-09 MED ORDER — INSULIN GLARGINE 100 UNIT/ML ~~LOC~~ SOLN
100.0000 [IU] | Freq: Every day | SUBCUTANEOUS | Status: DC
  Administered 2014-09-10 – 2014-09-11 (×2): 100 [IU] via SUBCUTANEOUS
  Filled 2014-09-09 (×2): qty 1

## 2014-09-09 MED ORDER — INSULIN ASPART 100 UNIT/ML ~~LOC~~ SOLN
0.0000 [IU] | Freq: Every day | SUBCUTANEOUS | Status: DC
  Administered 2014-09-09: 2 [IU] via SUBCUTANEOUS

## 2014-09-09 MED ORDER — SODIUM CHLORIDE 0.9 % IV SOLN
INTRAVENOUS | Status: DC
Start: 2014-09-09 — End: 2014-09-09
  Administered 2014-09-09: 75 mL/h via INTRAVENOUS

## 2014-09-09 NOTE — Progress Notes (Signed)
Patient arrived to 2w04, placed on tele, CCMD notified, VSS. Patient oriented to room. Assessment completed, admission assessment completed. Patient on droplet precautions, respiratory viral panel swab sent. Will continue to monitor.

## 2014-09-09 NOTE — Progress Notes (Addendum)
PROGRESS NOTE  Mary Acosta HEN:[id number] DOB: Sep 20, 1955 DOA: 09/08/2014 PCP: Thressa Sheller, MD  HPI/Subjective: [name] is a 59 y/o female that presented to the ED with SOB and cough.  She has a hx of HTN, thyroid cancer, hypothyroidism, DM, COPD, and HCAP with hospitalization 08/26/14-08/29/14.  She was discharged on prednisone and Levaquin. She completed steroid and antibiotic treatment.  She reports that she still has productive cough with greenish colored sputum. She has worsening shortness of breath, particularly when she walks.  Of note, patient had negative CTA for PE on 08/26/14.  Work up in the ED demonstrates negative chest x-ray for acute cardiopulmonary disease.  She has been on 2L of oxygen since yesterday but is not on any oxygen at home.  She is feeling better today but isn't back to baseline.  She also has a hx of chronic lower back pain, fibromyalgia, chronic pain syndrome, and chronic narcotic use (since ~ 1995).  She stopped taking morphine for several days this week trying to improve her breathing function.     Feels much better today, no further diarrhea   Assessment/Plan:  COPD exacerbation: Clinically improved but still with diffuse wheezing on auscultation.However comfortable and not in any distress.  Continue Solumedrol, nebulized bronchodilators and empiric Zithromax. Await respiratory virus panel   Diarrhea: ? Related to antibiotic use-was on Levaquin. Seems to have resolved spontaneously. Has prior history of C. difficile colitis, hence we'll check C. difficile PCR. Place on probiotics for now.  Hypertension: Blood pressure control with amlodipine, continue to hold HCTZ and lisinopril for now. Resume when able.   AKI: Likely due to prerenal.  Ibuprofen, lisinopril, mobile, and HCTZ held. Creatinine significantly improved with hydration, clinically euvolemic, will stop IV fluids and encourage oral intake. Await renal ultrasound.   Diabetes  mellitus: Uncontrolled due to patient being on steroids. Increase Lantus to 100 units, add 12 units of NovoLog with meals, change as a side to resistant. Follow CBGs and will titrate accordingly. A1c was 10.3 on 07/09/14.   Hypothyroidism: TSH was 3.3 on 08/28/14. Patient is on Synthroid at home. Continue Synthroid.  Chronic pain syndrome/fibromyalgia: Continue MS Contin  DVT Prophylaxis:  SQ Heparin  Code Status: Full Family Communication: No family present at bedside at time of visit Disposition Plan: Home when medically appropriate   Consultants:  None  Procedures:  Lower extremity venous duplex bilateral: pending  Antibiotics: Anti-infectives    Start     Dose/Rate Route Frequency Ordered Stop   09/09/14 1000  azithromycin (ZITHROMAX) tablet 250 mg     250 mg Oral Daily 09/09/14 0024 09/13/14 0959   09/09/14 0030  azithromycin (ZITHROMAX) tablet 500 mg     500 mg Oral Daily 09/09/14 0024 09/09/14 0040   09/08/14 0000  azithromycin (ZITHROMAX) 250 MG tablet     250 mg Oral Daily 09/08/14 2125         Objective: Filed Vitals:   09/09/14 0132 09/09/14 0653 09/09/14 0657 09/09/14 0733  BP:   134/75   Pulse: 107  97   Temp:   98.2 F (36.8 C)   TempSrc:   Oral   Resp: 20  18   Height:      Weight:  122.8 kg (270 lb 11.6 oz)    SpO2: 97%  97% 96%    Intake/Output Summary (Last 24 hours) at 09/09/14 1006 Last data filed at 09/09/14 0730  Gross per 24 hour  Intake    240 ml  Output    475 ml  Net   -235 ml   Filed Weights   09/08/14 2300 09/09/14 0653  Weight: 124 kg (273 lb 5.9 oz) 122.8 kg (270 lb 11.6 oz)    Exam: General: Well developed, well nourished, NAD, appears stated age, sitting up in a chair with nasal cannula   HEENT:  Anicteic Sclera, MMM.  Neck: Supple, no JVD, no masses  Cardiovascular: RRR, S1 S2 auscultated, no rubs, murmurs or gallops.   Respiratory: Wheezing diffusely  Abdomen: Soft, nontender, nondistended, + bowel sounds  Extremities:  warm, dry. Mild edema  Neuro: AAOx3 Skin: Without rashes exudates or nodules.   Psych: Normal affect and demeanor with intact judgement and insight   Data Reviewed: Basic Metabolic Panel:  Recent Labs Lab 09/08/14 1618 09/09/14 0146  NA 134* 133*  K 4.0 4.7  CL 96 99  CO2 24 24  GLUCOSE 214* 310*  BUN 29* 23  CREATININE 1.66* 1.35*  CALCIUM 9.9 8.9   Liver Function Tests:  Recent Labs Lab 09/09/14 0146  AST 17  ALT 17  ALKPHOS 104  BILITOT 1.1  PROT 7.1  ALBUMIN 3.4*   No results for input(s): LIPASE, AMYLASE in the last 168 hours. No results for input(s): AMMONIA in the last 168 hours. CBC:  Recent Labs Lab 09/08/14 1618 09/09/14 0146  WBC 21.3* 17.9*  HGB 15.3* 12.6  HCT 46.1* 38.5  MCV 79.2 79.5  PLT 280 211   Cardiac Enzymes: No results for input(s): CKTOTAL, CKMB, CKMBINDEX, TROPONINI in the last 168 hours. BNP (last 3 results) No results for input(s): PROBNP in the last 8760 hours. CBG:  Recent Labs Lab 09/08/14 2355 09/09/14 0650  GLUCAP 172* 367*    No results found for this or any previous visit (from the past 240 hour(s)).   Studies: Dg Chest 2 View  09/08/2014   CLINICAL DATA:  Acute shortness of breath, fatigue and cough. Initial encounter.  EXAM: CHEST  2 VIEW  COMPARISON:  08/26/2004 and prior chest radiographs  FINDINGS: The cardiomediastinal silhouette is unremarkable.  Mild peribronchial thickening is unchanged.  There is no evidence of focal airspace disease, pulmonary edema, suspicious pulmonary nodule/mass, pleural effusion, or pneumothorax.  No acute bony abnormalities are identified.  IMPRESSION: No evidence of acute cardiopulmonary disease.   Electronically Signed   By: Hassan Rowan M.D.   On: 09/08/2014 18:09    Scheduled Meds: . amitriptyline  100 mg Oral QHS  . amLODipine  10 mg Oral Daily  . antiseptic oral rinse  7 mL Mouth Rinse BID  . azithromycin  250 mg Oral Daily  . feeding supplement (ENSURE COMPLETE)  237 mL Oral  BID BM  . heparin  5,000 Units Subcutaneous 3 times per day  . insulin aspart  0-9 Units Subcutaneous TID WC  . insulin glargine  80 Units Subcutaneous Daily  . ipratropium-albuterol  3 mL Nebulization Q4H  . levothyroxine  150 mcg Oral QAC breakfast  . loratadine  10 mg Oral Daily  . methylPREDNISolone (SOLU-MEDROL) injection  60 mg Intravenous 3 times per day  . morphine  60 mg Oral Q12H  . sodium chloride  3 mL Intravenous Q12H   Continuous Infusions: . sodium chloride 75 mL/hr (09/09/14 0839)    Principal Problem:   SOB (shortness of breath) Active Problems:   Diabetes 1.5, managed as type 2   HLD (hyperlipidemia)   TOBACCO USER   Essential hypertension   LOW BACK PAIN, CHRONIC  Sleep apnea   Hypothyroid   TIA (transient ischemic attack)   Diarrhea   History of gastroesophageal reflux (GERD)   COPD (chronic obstructive pulmonary disease)   AKI (acute kidney injury)  Caroline E. Howell-Methvin, PA-S   [facility] Pager [phone]. If 7PM-7AM, please contact night-coverage at [email or web address], password [facility] 09/09/2014, 10:06 AM  LOS: 1 day    Attending Patient was seen, examined,treatment plan was discussed with the  Advance Practice Provider.  I have directly reviewed the clinical findings, lab, imaging studies and management of this patient in detail. I have made the necessary changes to the above noted documentation, and agree with the documentation, as recorded by the Advance Practice Provider.   Admitted with shortness of breath and diarrhea. Suspect shortness of breath secondary to COPD exacerbation. Significantly improved this morning, but not yet back to baseline, still has some wheezing on exam. Decrease steroids, have adjusted insulin dose as noted above. We'll continue to follow  Nena Alexander MD [facility].

## 2014-09-09 NOTE — Progress Notes (Signed)
Chaplain referred to pt for spiritual care consult. Pt narrated her illness journey and significant spiritual moments in her life. Mary Acosta faith is a central part of the pt experience, even as she has experienced hardships, especially in the past year. Chaplain offered emotional support and offered prayer. Page chaplain as needed.    09/09/14 1600  Clinical Encounter Type  Visited With Patient  Visit Type Initial;Spiritual support  Referral From Nurse  Spiritual Encounters  Spiritual Needs Emotional;Prayer  Stress Factors  Patient Stress Factors Family relationships;Major life changes;Health changes  Mary Acosta, Epifanio Lesches 09/09/2014 4:48 PM

## 2014-09-09 NOTE — Progress Notes (Signed)
Utilization review completed.  

## 2014-09-09 NOTE — Progress Notes (Signed)
INITIAL NUTRITION ASSESSMENT  DOCUMENTATION CODES Per approved criteria  -Obesity Unspecified   INTERVENTION: Discontinue Ensure Complete.  Provide Glucerna Shake po BID, each supplement provides 220 kcal and 10 grams of protein  Encourage adequate po intake.  NUTRITION DIAGNOSIS: Inadequate oral intake related to decreased appetite as evidenced by varied meal completion of 15-100%.   Goal: Pt to meet >/= 90% of their estimated nutrition needs   Monitor:  PO intake, weight trends, labs, I/O's  Reason for Assessment: MST  59 y.o. female  Admitting Dx: SOB (shortness of breath)  ASSESSMENT: Pt with past medical history of hypertension, thyroidism, diabetes mellitus, COPD, who presents with shortness of breath and cough.  Pt reports her appetite has been coming and going. Meal completion has varied from 15-100%. Pt reports her medications have been affecting her appetite. Pt reports she has been "making" herself eat at home (at least 3 meals a day) to obtain adequate nutrition. Pt has also been drinking Glucerna/Ensure at home BID and reports she would like some while admitted. RD to modify orders. Noted pt with a 3% weight loss in 2 weeks, however it is not found significant.  Pt with no observed significant fat or muscle mass loss.  Labs: Low sodium and GFR, High creatinine.  Height: Ht Readings from Last 1 Encounters:  09/08/14 5\' 9"  (1.753 m)    Weight: Wt Readings from Last 1 Encounters:  09/09/14 270 lb 11.6 oz (122.8 kg)    Ideal Body Weight: 145 lbs  % Ideal Body Weight: 186%  Wt Readings from Last 10 Encounters:  09/09/14 270 lb 11.6 oz (122.8 kg)  08/26/14 279 lb 12.2 oz (126.9 kg)  07/09/14 278 lb 1.6 oz (126.145 kg)  05/18/12 262 lb (118.842 kg)  05/08/12 250 lb (113.399 kg)  04/08/11 235 lb (106.595 kg)  04/05/11 237 lb (107.502 kg)  03/04/11 229 lb (103.874 kg)  01/03/11 224 lb (101.606 kg)  01/13/07 278 lb (126.1 kg)    Usual Body Weight:  278 lbs  % Usual Body Weight: 97%  BMI:  Body mass index is 39.96 kg/(m^2). Class II obesity  Estimated Nutritional Needs: Kcal: 2000-2300 Protein: 120-130 grams Fluid: 2.1 - 2.3 L/day  Skin: non-pitting LE edema  Diet Order: Diet Carb Modified  EDUCATION NEEDS: -No education needs identified at this time   Intake/Output Summary (Last 24 hours) at 09/09/14 1042 Last data filed at 09/09/14 0730  Gross per 24 hour  Intake    240 ml  Output    475 ml  Net   -235 ml    Last BM: 1/13  Labs:   Recent Labs Lab 09/08/14 1618 09/09/14 0146  NA 134* 133*  K 4.0 4.7  CL 96 99  CO2 24 24  BUN 29* 23  CREATININE 1.66* 1.35*  CALCIUM 9.9 8.9  GLUCOSE 214* 310*    CBG (last 3)   Recent Labs  09/08/14 2355 09/09/14 0650  GLUCAP 172* 367*    Scheduled Meds: . amitriptyline  100 mg Oral QHS  . amLODipine  10 mg Oral Daily  . antiseptic oral rinse  7 mL Mouth Rinse BID  . azithromycin  250 mg Oral Daily  . feeding supplement (ENSURE COMPLETE)  237 mL Oral BID BM  . heparin  5,000 Units Subcutaneous 3 times per day  . insulin aspart  0-9 Units Subcutaneous TID WC  . insulin glargine  80 Units Subcutaneous Daily  . ipratropium-albuterol  3 mL Nebulization Q4H  . levothyroxine  150 mcg Oral QAC breakfast  . loratadine  10 mg Oral Daily  . methylPREDNISolone (SOLU-MEDROL) injection  60 mg Intravenous 3 times per day  . morphine  60 mg Oral Q12H  . sodium chloride  3 mL Intravenous Q12H    Continuous Infusions: . sodium chloride 75 mL/hr (09/09/14 3329)    Past Medical History  Diagnosis Date  . Anxiety     with panic attacks  . Insomnia   . COPD (chronic obstructive pulmonary disease)   . Polymyalgia rheumatica   . Fatty liver disease, nonalcoholic   . Scoliosis   . Asthma   . Depression   . History of TIA (transient ischemic attack) 11-01-2010    NO RESIDUAL  . Hyperlipidemia   . Lumbar stenosis   . Chronic pain syndrome     [facility]  . Seasonal allergies   . Chronic narcotic use   . Frequency of urination   . Urgency of urination   . Nocturia   . Vaginal pain S/P SLING  FEB 2012  . Hypertension   . OSA (obstructive sleep apnea)     NO CPAP SINCE WT LOSS  . Hypothyroidism   . IDDM (insulin dependent diabetes mellitus)   . Daily headache   . Osteoarthritis     with severe disease in knee  . DJD (degenerative joint disease)   . Arthritis     "back; feet; hands; shoulders" (08/26/2014)  . Fibromyalgia   . Chronic lower back pain   . Thyroid cancer   . Cervical cancer   . Pneumonia "several times"  . HCAP (healthcare-associated pneumonia) 08/26/2014    Past Surgical History  Procedure Laterality Date  . Appendectomy  1982  . Tubal ligation  1983  . Hysteroscopy w/d&c  08-19-2007    PMB  . Laparoscopic gastric banding  03/01/2006    TRUNCAL VAGOTOMY/ PLACEMENT OF VG BAND  . Knee arthroscopy w/ debridement Left 03/29/2006    INTERNAL DERANGEMENT/ SEVERE DJD/ MENISCUS TEARS  . Transvaginal subureteral tape/ sling  09-28-2010    MIXED URINARY INCONTINENCE  . Revision total knee arthroplasty Left 08-29-2008; 05/2009  . Total knee arthroplasty Left 01-23-2007    SEVERE DJD  . Total thyroidectomy  11-22-2005    BILATERAL THYROID NODULES-- PAPILLARY CARCINOMA (0.5CM)/ ADENOMATOID NODULES  . Breast lumpectomy Left 02-28-2005    ATYPICAL DUCTAL HYPERPLASIA  . Cardiac catheterization  09-04-2004    NORMAL CORONARY ANATOMY/ NORMAL LVF/ EF 60%  . Laparoscopic cholecystectomy  06-10-2002  . Tonsillectomy  1969  . Transthoracic echocardiogram  12-27-2010    LVSF NORMAL / EF 51-88%/ GRADE I DIASTOLIC DYSFUNCTION/ MILD MITRAL REGURG. / MILDLY DILATED LEFT ATRIUM/ MILDY INCREASED SYSTOLIC PRESSURE OF PULMONARY ARTERIES  . Cardiovascular stress test  12-27-2010  DR [place]    ABNORMAL NUCLEAR STUDY W/ /MILD INFERIOR ISCHEMIA/ EF 69%/  CT HEART ANGIOGRAM ;  NO ACUTE FINDINGS  . Cystoscopy  05/18/2012    Procedure:  CYSTOSCOPY;  Surgeon: Reece Packer, MD;  Location: [facility];  Service: Urology;  Laterality: N/A;  examination under anethesia  . Cryoablation  05/16/2003    w/LEEP FOR ABNORMAL PAP SMEAR    Kallie Locks, MS, RD, LDN Pager # [phone] After hours/ weekend pager # [phone]

## 2014-09-10 DIAGNOSIS — R0602 Shortness of breath: Secondary | ICD-10-CM

## 2014-09-10 DIAGNOSIS — R609 Edema, unspecified: Secondary | ICD-10-CM

## 2014-09-10 LAB — BASIC METABOLIC PANEL
Anion gap: 9 (ref 5–15)
BUN: 31 mg/dL — AB (ref 6–23)
CO2: 24 mmol/L (ref 19–32)
CREATININE: 1.06 mg/dL (ref 0.50–1.10)
Calcium: 8.6 mg/dL (ref 8.4–10.5)
Chloride: 98 mEq/L (ref 96–112)
GFR calc Af Amer: 66 mL/min — ABNORMAL LOW (ref >90)
GFR, EST NON AFRICAN AMERICAN: 57 mL/min — AB (ref >90)
Glucose, Bld: 428 mg/dL — ABNORMAL HIGH (ref 70–99)
Potassium: 4.9 mmol/L (ref 3.5–5.1)
SODIUM: 131 mmol/L — AB (ref 135–145)

## 2014-09-10 LAB — GLUCOSE, CAPILLARY
GLUCOSE-CAPILLARY: 247 mg/dL — AB (ref 70–99)
Glucose-Capillary: 267 mg/dL — ABNORMAL HIGH (ref 70–99)
Glucose-Capillary: 183 mg/dL — ABNORMAL HIGH (ref 70–99)
Glucose-Capillary: 147 mg/dL — ABNORMAL HIGH (ref 70–99)

## 2014-09-10 LAB — CBC
HCT: 36 % (ref 36.0–46.0)
Hemoglobin: 11.4 g/dL — ABNORMAL LOW (ref 12.0–15.0)
MCH: 25.9 pg — ABNORMAL LOW (ref 26.0–34.0)
MCHC: 31.7 g/dL (ref 30.0–36.0)
MCV: 81.6 fL (ref 78.0–100.0)
Platelets: 199 10*3/uL (ref 150–400)
RBC: 4.41 MIL/uL (ref 3.87–5.11)
RDW: 13.9 % (ref 11.5–15.5)
WBC: 13.6 10*3/uL — ABNORMAL HIGH (ref 4.0–10.5)

## 2014-09-10 MED ORDER — INSULIN ASPART 100 UNIT/ML ~~LOC~~ SOLN
16.0000 [IU] | Freq: Three times a day (TID) | SUBCUTANEOUS | Status: DC
  Administered 2014-09-10 – 2014-09-11 (×3): 16 [IU] via SUBCUTANEOUS

## 2014-09-10 MED ORDER — POLYETHYLENE GLYCOL 3350 17 G PO PACK
17.0000 g | PACK | Freq: Every day | ORAL | Status: DC
  Administered 2014-09-10 – 2014-09-11 (×2): 17 g via ORAL
  Filled 2014-09-10 (×3): qty 1

## 2014-09-10 MED ORDER — LISINOPRIL 10 MG PO TABS
10.0000 mg | ORAL_TABLET | Freq: Every day | ORAL | Status: DC
  Administered 2014-09-10 – 2014-09-11 (×2): 10 mg via ORAL
  Filled 2014-09-10 (×2): qty 1

## 2014-09-10 MED ORDER — METHYLPREDNISOLONE SODIUM SUCC 40 MG IJ SOLR
40.0000 mg | Freq: Every day | INTRAMUSCULAR | Status: DC
  Filled 2014-09-10: qty 1

## 2014-09-10 NOTE — Progress Notes (Signed)
VASCULAR LAB PRELIMINARY  PRELIMINARY  PRELIMINARY  PRELIMINARY  Bilateral lower extremity venous Dopplers completed.    Preliminary report:  There is no DVT or SVT noted in the bilateral lower extremities.   Steven Veazie, RVT 09/10/2014, 10:18 AM

## 2014-09-10 NOTE — Progress Notes (Signed)
PROGRESS NOTE  Mary Acosta XEN:[id number] DOB: 1956/02/08 DOA: 09/08/2014 PCP: Thressa Sheller, MD  HPI/Subjective: Mary Acosta is a 59 y/o female that presented to the ED with SOB and cough.  She has a hx of HTN, thyroid cancer, hypothyroidism, DM, COPD, and HCAP with hospitalization 08/26/14-08/29/14.  She was discharged on prednisone and Levaquin. She completed steroid and antibiotic treatment.  She reports that she still has productive cough with greenish colored sputum. She has worsening shortness of breath, particularly when she walks.  Of note, patient had negative CTA for PE on 08/26/14.  Work up in the ED demonstrates negative chest x-ray for acute cardiopulmonary disease.    Assessment/Plan:  COPD exacerbation: Clinically improved but still with some wheezing on auscultation. Decrease Solumedrol to daily, nebulized bronchodilators and empiric Zithromax. Await respiratory virus panel   Diarrhea:  Related to antibiotic use-was on Levaquin. Patient reports no bowel movement since Sunday.  She requests miralax.  Has prior history of C. difficile colitis, So for now we will leave the order to check c-diff PCR. Placed on probiotics.  Hypertension: Blood pressure control with amlodipine and lisinopril, continue to hold HCTZ  for now.   AKI: Likely due to prerenal.  Ibuprofen, lisinopril, and HCTZ. Creatinine significantly improved with hydration, clinically euvolemic, IV fluids were discontinued. Renal ultrasound is negative for acute findings.   Diabetes mellitus: Uncontrolled due to steroids. Increase Lantus to 100 units, 18 units of NovoLog with meals,SSI resistant. Follow CBGs and will titrate accordingly. A1c was 10.3 on 07/09/14.   Hypothyroidism: TSH was 3.3 on 08/28/14.  Continue Synthroid.  Chronic pain syndrome/fibromyalgia: Continue MS Contin  DVT Prophylaxis:  SQ Heparin  Code Status: Full Family Communication: No family present at bedside at time of visit, patient  is alert and oriented. Disposition Plan: Home when medically appropriate   Consultants:  None  Procedures:  Lower extremity venous duplex bilateral: Negative for DVT  Antibiotics: Anti-infectives    Start     Dose/Rate Route Frequency Ordered Stop   09/09/14 1000  azithromycin (ZITHROMAX) tablet 250 mg     250 mg Oral Daily 09/09/14 0024 09/13/14 0959   09/09/14 0030  azithromycin (ZITHROMAX) tablet 500 mg     500 mg Oral Daily 09/09/14 0024 09/09/14 0040   09/08/14 0000  azithromycin (ZITHROMAX) 250 MG tablet     25 0 mg Oral Daily 09/08/14 2125         Objective: Filed Vitals:   09/09/14 2025 09/09/14 2053 09/10/14 0458 09/10/14 1423  BP: 139/79  137/78   Pulse: 94 86 102   Temp: 97.9 F (36.6 C)  98.5 F (36.9 C)   TempSrc: Oral  Oral   Resp: 16 18 16    Height:      Weight:   124.9 kg (275 lb 5.7 oz)   SpO2: 99% 100% 97% 98%    Intake/Output Summary (Last 24 hours) at 09/10/14 1505 Last data filed at 09/10/14 0900  Gross per 24 hour  Intake    120 ml  Output    300 ml  Net   -180 ml   Filed Weights   09/08/14 2300 09/09/14 0653 09/10/14 0458  Weight: 124 kg (273 lb 5.9 oz) 122.8 kg (270 lb 11.6 oz) 124.9 kg (275 lb 5.7 oz)    Exam: General: Well developed, obese, NAD, appears stated age,  able to ambulate about the room without difficulty HEENT:  Anicteic Sclera, MMM.  Neck: Supple, no JVD, no masses  Cardiovascular:  RRR, S1 S2 auscultated, no rubs, murmurs or gallops.   Respiratory: Expiratory wheeze heard bilaterally. Patient also makes significant throat noises when breathing. Abdomen: Soft, nontender, nondistended, + bowel sounds, no masses Extremities: warm, dry. No edema, changes of venous stasis in the lower extremities  Neuro: AAOx3, no focal neuro deficits Psych: Normal affect and demeanor with intact judgement and insight   Data Reviewed: Basic Metabolic Panel:  Recent Labs Lab 09/08/14 1618 09/09/14 0146 09/09/14 1150 09/10/14 0405    NA 134* 133*  --  131*  K 4.0 4.7  --  4.9  CL 96 99  --  98  CO2 24 24  --  24  GLUCOSE 214* 310* 605* 428*  BUN 29* 23  --  31*  CREATININE 1.66* 1.35*  --  1.06  CALCIUM 9.9 8.9  --  8.6   Liver Function Tests:  Recent Labs Lab 09/09/14 0146  AST 17  ALT 17  ALKPHOS 104  BILITOT 1.1  PROT 7.1  ALBUMIN 3.4*   CBC:  Recent Labs Lab 09/08/14 1618 09/09/14 0146 09/10/14 0405  WBC 21.3* 17.9* 13.6*  HGB 15.3* 12.6 11.4*  HCT 46.1* 38.5 36.0  MCV 79.2 79.5 81.6  PLT 280 211 199   CBG:  Recent Labs Lab 09/09/14 1253 09/09/14 1622 09/09/14 2210 09/10/14 0635 09/10/14 1140  GLUCAP 500* 373* 214* 267* 183*      Studies: Dg Chest 2 View  09/08/2014   CLINICAL DATA:  Acute shortness of breath, fatigue and cough. Initial encounter.  EXAM: CHEST  2 VIEW  COMPARISON:  08/26/2004 and prior chest radiographs  FINDINGS: The cardiomediastinal silhouette is unremarkable.  Mild peribronchial thickening is unchanged.  There is no evidence of focal airspace disease, pulmonary edema, suspicious pulmonary nodule/mass, pleural effusion, or pneumothorax.  No acute bony abnormalities are identified.  IMPRESSION: No evidence of acute cardiopulmonary disease.   Electronically Signed   By: Hassan Rowan M.D.   On: 09/08/2014 18:09   US Renal  09/09/2014   CLINICAL DATA:  Acute kidney injury  EXAM: RENAL/URINARY TRACT ULTRASOUND COMPLETE  COMPARISON:  10/24/2010  FINDINGS: Right Kidney:  Length: 10.5 cm. Echogenicity within normal limits. No mass or hydronephrosis visualized.  Left Kidney:  Length: 10.3 cm. Echogenicity within normal limits. No mass or hydronephrosis visualized.  Bladder:  Appears normal for degree of bladder distention.  IMPRESSION: No hydronephrosis.  No acute findings.   Electronically Signed   By: Rolm Baptise M.D.   On: 09/09/2014 20:39    Scheduled Meds: . amitriptyline  100 mg Oral QHS  . amLODipine  10 mg Oral Daily  . antiseptic oral rinse  7 mL Mouth Rinse BID   . azithromycin  250 mg Oral Daily  . feeding supplement (GLUCERNA SHAKE)  237 mL Oral BID BM  . heparin  5,000 Units Subcutaneous 3 times per day  . insulin aspart  0-20 Units Subcutaneous TID WC  . insulin aspart  0-5 Units Subcutaneous QHS  . insulin aspart  16 Units Subcutaneous TID WC  . insulin glargine  100 Units Subcutaneous Daily  . ipratropium-albuterol  3 mL Nebulization TID  . levothyroxine  150 mcg Oral QAC breakfast  . lisinopril  10 mg Oral Daily  . loratadine  10 mg Oral Daily  . [START ON 09/11/2014] methylPREDNISolone (SOLU-MEDROL) injection  40 mg Intravenous Daily  . morphine  60 mg Oral Q12H  . polyethylene glycol  17 g Oral Daily  . saccharomyces boulardii  250  mg Oral BID  . sodium chloride  3 mL Intravenous Q12H   Continuous Infusions:    Principal Problem:   SOB (shortness of breath) Active Problems:   Diabetes 1.5, managed as type 2   HLD (hyperlipidemia)   TOBACCO USER   Essential hypertension   LOW BACK PAIN, CHRONIC   Sleep apnea   Hypothyroid   TIA (transient ischemic attack)   Diarrhea   History of gastroesophageal reflux (GERD)   COPD (chronic obstructive pulmonary disease)   AKI (acute kidney injury)  Imogene Burn, PA-C [facility] Pager [phone]. If 7PM-7AM, please contact night-coverage at [email or web address], password [facility] 09/10/2014, 3:05 PM  LOS: 2 days    Attending Patient was seen, examined,treatment plan was discussed with the  Advance Practice Provider.  I have directly reviewed the clinical findings, lab, imaging studies and management of this patient in detail. I have made the necessary changes to the above noted documentation, and agree with the documentation, as recorded by the Advance Practice Provider.   Improving, some mild wheezing. No further diarrhea. Suspect if clinical improvement continues patient should be able to discharge home on 1/17.  Nena Alexander MD [facility].

## 2014-09-11 LAB — GLUCOSE, CAPILLARY: Glucose-Capillary: 91 mg/dL (ref 70–99)

## 2014-09-11 MED ORDER — GLUCERNA SHAKE PO LIQD: 237.0000 mL | Freq: Two times a day (BID) | ORAL | Status: DC

## 2014-09-11 MED ORDER — ALBUTEROL SULFATE HFA 108 (90 BASE) MCG/ACT IN AERS: 2.0000 | INHALATION_SPRAY | Freq: Four times a day (QID) | RESPIRATORY_TRACT | Status: DC | PRN

## 2014-09-11 MED ORDER — MAGNESIUM HYDROXIDE 400 MG/5ML PO SUSP
30.0000 mL | Freq: Once | ORAL | Status: AC
  Administered 2014-09-11: 30 mL via ORAL
  Filled 2014-09-11: qty 30

## 2014-09-11 MED ORDER — TIOTROPIUM BROMIDE MONOHYDRATE 18 MCG IN CAPS: 18.0000 ug | ORAL_CAPSULE | Freq: Every day | RESPIRATORY_TRACT | Status: DC

## 2014-09-11 MED ORDER — PREDNISONE 20 MG PO TABS: ORAL_TABLET | ORAL | Status: DC

## 2014-09-11 MED ORDER — OFLOXACIN 0.3 % OP SOLN: 1.0000 [drp] | Freq: Four times a day (QID) | OPHTHALMIC | Status: DC

## 2014-09-11 MED ORDER — SACCHAROMYCES BOULARDII 250 MG PO CAPS: 250.0000 mg | ORAL_CAPSULE | Freq: Two times a day (BID) | ORAL | Status: DC

## 2014-09-11 NOTE — Discharge Summary (Signed)
Physician Discharge Summary  Mary Acosta WSF:[id number] DOB: [birth year] DOA: 09/08/2014  PCP: Thressa Sheller, MD  Admit date: 09/08/2014 Discharge date: 09/11/2014  Time spent: 30 minutes  Recommendations for Outpatient Follow-up:  1. CBC/bmet Hospital follow-up. Diabetes management - elevated CBGs. COPD management. 2.  Respiratory viral panel not yet resulted at the time of discharge.  Discharge Diagnoses:  Principal Problem:   SOB (shortness of breath) Active Problems:   Diabetes 1.5, managed as type 2   HLD (hyperlipidemia)   TOBACCO USER   Essential hypertension   LOW BACK PAIN, CHRONIC   Sleep apnea   Hypothyroid   Diarrhea   History of gastroesophageal reflux (GERD)   COPD (chronic obstructive pulmonary disease)   AKI (acute kidney injury)   Discharge Condition: Stable  Diet recommendation: Carpal modified heart healthy  History of present illness:   [name] is a 59 y/o female that presented to the ED with SOB and cough. She has a hx of HTN, thyroid cancer, hypothyroidism, DM, COPD, and HCAP with hospitalization 08/26/14-08/29/14. She was discharged on prednisone and Levaquin. She completed steroid and antibiotic treatment. She reports that she still has productive cough with greenish colored sputum. She has worsening shortness of breath, particularly when she walks. Of note, patient had negative CTA for PE on 08/26/14. Work up in the ED demonstrates negative chest x-ray for acute cardiopulmonary disease.   Hospital Course:   COPD exacerbation: Significantly improved at the time of discharge. Now with no wheezing.  She was treated with IV Solu-Medrol, antibiotics, and nebulizers throughout her stay.   At the time of discharge she had completed her antibiotic course. She was discharged with a prednisone taper, albuterol inhaler, and Spiriva for ongoing maintenance therapy.  Respiratory viral panel was collected during this hospitalization, but had not  yet resulted at the time of discharge.  Diarrhea: Related to antibiotic use-was on Levaquin. Patient reports no bowel movement since the day of admission. She requested on January 16.  C. difficile PCR was unable to be collected. She was placed on probiotics.  Hypertension: Blood pressure controlled with amlodipine and lisinopril.  HCTZ was held as an inpatient due to elevated creatinine, but will be restarted on discharge.   AKI: Likely due acute illness and medications including Ibuprofen, lisinopril, and HCTZ. Creatinine significantly improved with hydration, she remained clinically euvolemic, IV fluids were discontinued. Renal ultrasound is negative for acute findings.   Diabetes mellitus: CBGs were difficult to control. This was worsened due to steroids. She was on large doses of Lantus and NovoLog. Her hemoglobin A1c was 10.3 on 07/09/2014. We will resume her previous regimen of insulin and direct her back to her primary care physician for further diabetic management.   Hypothyroidism: TSH was 3.3 on 08/28/14. Continue Synthroid.  Chronic pain syndrome/fibromyalgia: Continue MS Contin   Procedures:  Lower extremity Doppler ultrasound was negative for DVT bilaterally  Consultations:  None  Discharge Exam: Filed Vitals:   09/10/14 2044 09/10/14 2100 09/11/14 0607 09/11/14 0914  BP:  135/65 113/55   Pulse:  97 85   Temp:  98.1 F (36.7 C) 98.1 F (36.7 C)   TempSrc:  Oral Oral   Resp:  18 18   Height:      Weight:   126.1 kg (278 lb)   SpO2: 98% 99% 98% 93%   Filed Weights   09/09/14 0653 09/10/14 0458 09/11/14 0607  Weight: 122.8 kg (270 lb 11.6 oz) 124.9 kg (275 lb 5.7 oz)  126.1 kg (278 lb)     General: Well-developed, obese, pleasant female in no apparent distress. Sitting on the side of the bed Cardiovascular: Regular rate and rhythm no murmurs rubs or gallops Respiratory: Minimal expiratory wheeze heard (much improved). No increased work of breathing Abdominal:  Obese, nontender, nondistended, no masses, positive bowel sounds Extremities: 5 over 5 strength in each, ambulates with ease, no edema or cyanosis noted. Changes of venous stasis on lower extremities bilaterally  Discharge Instructions  Discharge Instructions    Diet - low sodium heart healthy    Complete by:  As directed      Increase activity slowly    Complete by:  As directed           Discharge Medication List as of 09/11/2014  9:10 AM    START taking these medications   Details  !! albuterol (PROVENTIL HFA;VENTOLIN HFA) 108 (90 BASE) MCG/ACT inhaler Inhale 2 puffs into the lungs every 6 (six) hours as needed for wheezing or shortness of breath., Starting 09/11/2014, Until Discontinued, Print    feeding supplement, GLUCERNA SHAKE, (GLUCERNA SHAKE) LIQD Take 237 mLs by mouth 2 (two) times daily between meals., Starting 09/11/2014, Until Discontinued, Print    ofloxacin (OCUFLOX) 0.3 % ophthalmic solution Place 1 drop into the right eye 4 (four) times daily., Starting 09/11/2014, Until Discontinued, Print    saccharomyces boulardii (FLORASTOR) 250 MG capsule Take 1 capsule (250 mg total) by mouth 2 (two) times daily., Starting 09/11/2014, Until Discontinued, Print    Spacer/Aero-Holding Chambers (AEROCHAMBER PLUS WITH MASK) inhaler Use as instructed, Print    tiotropium (SPIRIVA HANDIHALER) 18 MCG inhalation capsule Place 1 capsule (18 mcg total) into inhaler and inhale daily., Starting 09/11/2014, Until Discontinued, Print    !! albuterol (PROVENTIL HFA;VENTOLIN HFA) 108 (90 BASE) MCG/ACT inhaler Inhale 2 puffs into the lungs every 4 (four) hours as needed for wheezing or shortness of breath., Starting 09/08/2014, Until Discontinued, Print     !! - Potential duplicate medications found. Please discuss with provider.    CONTINUE these medications which have CHANGED   Details  predniSONE (DELTASONE) 20 MG tablet 3 tabs po daily x 2 days, then 2 tabs x 2 days, then 1.5 tabs x 2 days,  then 1 tab x 2 days, then 0.5 tabs x 2 days, Print      CONTINUE these medications which have NOT CHANGED   Details  acetaminophen (TYLENOL) 500 MG tablet Take 500 mg by mouth every 4 (four) hours as needed for moderate pain., Until Discontinued, Historical Med    amitriptyline (ELAVIL) 25 MG tablet Take 100 mg by mouth at bedtime., Until Discontinued, Historical Med    amLODipine (NORVASC) 5 MG tablet Take 1 tablet (5 mg total) by mouth daily., Starting 08/29/2014, Until Discontinued, Print    cetirizine (ZYRTEC) 10 MG tablet Take 10 mg by mouth daily as needed for allergies. , Until Discontinued, Historical Med    clonazePAM (KLONOPIN) 0.5 MG tablet Take 0.5 mg by mouth daily as needed for anxiety., Until Discontinued, Historical Med    hydrochlorothiazide (MICROZIDE) 12.5 MG capsule Take 1 capsule (12.5 mg total) by mouth daily., Starting 08/29/2014, Until Discontinued, Print    insulin glargine (LANTUS) 100 UNIT/ML injection Inject 0.8 mLs (80 Units total) into the skin daily., Starting 08/29/2014, Until Discontinued, Print    insulin lispro (HUMALOG) 100 UNIT/ML injection Inject 0.1 mLs (10 Units total) into the skin 3 (three) times daily before meals., Starting 08/29/2014, Until Discontinued, Print  levothyroxine (SYNTHROID, LEVOTHROID) 150 MCG tablet Take 150 mcg by mouth daily before breakfast., Until Discontinued, Historical Med    lisinopril (PRINIVIL,ZESTRIL) 10 MG tablet Take 1 tablet (10 mg total) by mouth daily., Starting 08/29/2014, Until Discontinued, Print    morphine (MS CONTIN) 60 MG 12 hr tablet Take 60 mg by mouth every 12 (twelve) hours., Until Discontinued, Historical Med    polyethylene glycol (MIRALAX / GLYCOLAX) packet Take 17 g by mouth 2 (two) times daily as needed for moderate constipation., Starting 08/29/2014, Until Discontinued, Print    potassium chloride SA (K-DUR,KLOR-CON) 20 MEQ tablet Take 20 mEq by mouth as needed (for cramping). , Starting 05/30/2014, Until  Discontinued, Historical Med      STOP taking these medications     ibuprofen (ADVIL,MOTRIN) 200 MG tablet      ibuprofen (ADVIL,MOTRIN) 200 MG tablet      levofloxacin (LEVAQUIN) 750 MG tablet      meloxicam (MOBIC) 7.5 MG tablet        Allergies  Allergen Reactions  . Losartan Other (See Comments)    Myalgias and muscle cramping  . Sulfonamide Derivatives Nausea Only   Follow-up Information    Follow up with Thressa Sheller, MD. Schedule an appointment as soon as possible for a visit in 3 days.   Specialty:  Internal Medicine   Contact information:   [address], [place] [id number] [phone]        The results of significant diagnostics from this hospitalization (including imaging, microbiology, ancillary and laboratory) are listed below for reference.     Labs: Basic Metabolic Panel:  Recent Labs Lab 09/08/14 1618 09/09/14 0146 09/09/14 1150 09/10/14 0405  NA 134* 133*  --  131*  K 4.0 4.7  --  4.9  CL 96 99  --  98  CO2 24 24  --  24  GLUCOSE 214* 310* 605* 428*  BUN 29* 23  --  31*  CREATININE 1.66* 1.35*  --  1.06  CALCIUM 9.9 8.9  --  8.6   Liver Function Tests:  Recent Labs Lab 09/09/14 0146  AST 17  ALT 17  ALKPHOS 104  BILITOT 1.1  PROT 7.1  ALBUMIN 3.4*   CBC:  Recent Labs Lab 09/08/14 1618 09/09/14 0146 09/10/14 0405  WBC 21.3* 17.9* 13.6*  HGB 15.3* 12.6 11.4*  HCT 46.1* 38.5 36.0  MCV 79.2 79.5 81.6  PLT 280 211 199   CBG:  Recent Labs Lab 09/10/14 0635 09/10/14 1140 09/10/14 1659 09/10/14 2056 09/11/14 0604  GLUCAP 267* 183* 247* 147* 91   Signed:  Karen Kitchens  [facility] 09/11/2014, 3:02 PM  Attending Patient was seen, examined,treatment plan was discussed with the  Advance Practice Provider.  I have directly reviewed the clinical findings, lab, imaging studies and management of this patient in detail. I have made the necessary changes to the above noted  documentation, and agree with the documentation, as recorded by the Advance Practice Provider.   Significantly improved, lungs clear, ambulated in the hallway yesterday evening without any difficulty. She thinks she is breathing much better "have not felt this well in the past 10 years". She is currently stable for discharge. Rest as above.  Nena Alexander MD [facility].

## 2014-09-14 LAB — RESPIRATORY VIRUS PANEL
ADENOVIRUS: NEGATIVE
INFLUENZA A: NEGATIVE
Influenza B: NEGATIVE
Metapneumovirus: NEGATIVE
PARAINFLUENZA 1 A: NEGATIVE
PARAINFLUENZA 2 A: NEGATIVE
PARAINFLUENZA 3 A: NEGATIVE
Respiratory Syncytial Virus A: NEGATIVE
Respiratory Syncytial Virus B: NEGATIVE
Rhinovirus: NEGATIVE

## 2014-09-15 DIAGNOSIS — J441 Chronic obstructive pulmonary disease with (acute) exacerbation: Secondary | ICD-10-CM | POA: Diagnosis not present

## 2014-09-15 DIAGNOSIS — J189 Pneumonia, unspecified organism: Secondary | ICD-10-CM | POA: Diagnosis not present

## 2014-09-15 DIAGNOSIS — J069 Acute upper respiratory infection, unspecified: Secondary | ICD-10-CM | POA: Diagnosis not present

## 2014-09-17 ENCOUNTER — Encounter: Payer: Self-pay | Admitting: Internal Medicine

## 2014-09-22 DIAGNOSIS — J069 Acute upper respiratory infection, unspecified: Secondary | ICD-10-CM | POA: Diagnosis not present

## 2014-09-22 DIAGNOSIS — J189 Pneumonia, unspecified organism: Secondary | ICD-10-CM | POA: Diagnosis not present

## 2014-09-22 DIAGNOSIS — J441 Chronic obstructive pulmonary disease with (acute) exacerbation: Secondary | ICD-10-CM | POA: Diagnosis not present

## 2014-09-22 DIAGNOSIS — J45909 Unspecified asthma, uncomplicated: Secondary | ICD-10-CM | POA: Diagnosis not present

## 2014-09-22 DIAGNOSIS — I952 Hypotension due to drugs: Secondary | ICD-10-CM | POA: Diagnosis not present

## 2014-09-22 DIAGNOSIS — E890 Postprocedural hypothyroidism: Secondary | ICD-10-CM | POA: Diagnosis not present

## 2014-09-26 ENCOUNTER — Encounter: Payer: Self-pay | Admitting: Internal Medicine

## 2014-09-26 ENCOUNTER — Ambulatory Visit (INDEPENDENT_AMBULATORY_CARE_PROVIDER_SITE_OTHER): Payer: Medicare Other | Admitting: Internal Medicine

## 2014-09-26 VITALS — BP 110/76 | HR 94 | Ht 69.0 in | Wt 271.0 lb

## 2014-09-26 DIAGNOSIS — R5381 Other malaise: Secondary | ICD-10-CM

## 2014-09-26 DIAGNOSIS — J449 Chronic obstructive pulmonary disease, unspecified: Secondary | ICD-10-CM | POA: Diagnosis not present

## 2014-09-26 DIAGNOSIS — R0600 Dyspnea, unspecified: Secondary | ICD-10-CM

## 2014-09-26 MED ORDER — IPRATROPIUM-ALBUTEROL 0.5-2.5 (3) MG/3ML IN SOLN: 3.0000 mL | Freq: Four times a day (QID) | RESPIRATORY_TRACT | Status: DC

## 2014-09-26 NOTE — Patient Instructions (Addendum)
ICD-9-CM ICD-10-CM   1. Chronic obstructive pulmonary disease, unspecified COPD, unspecified chronic bronchitis type 496 J44.9   2. Physical deconditioning 799.3 R53.81   3. Dyspnea 786.09 R06.00     Start duoneb 4 times daily Start home physical therapy  rec Full PFT in 3-4 weeks Fu with my NP Tammy in 3-4 weeks after PFT

## 2014-09-26 NOTE — Progress Notes (Signed)
Subjective:    Patient ID: Mary Acosta, female    DOB: 12/19/1955, 59 y.o.   MRN: [id number]  PCP Thressa Sheller, MD   HPI  IOV 09/26/2014  Chief Complaint  Patient presents with  . Pulmonary Consult    Pt referred by Dr. Sherald Barge for pneumonia.     59 year-old morbidly obese female with multiple medical problems including obesity, chronic pain, diabetes says has a long history of COPD not otherwise specified and quit smoking 4 years ago [close to 100 pack smoking history]. At baseline prior to December 2015 she says she was quite functional without much of dyspnea for activities of daily living. Then in mid November 2015 had an admission for hyperglycemia. This was followed by an admission 08/26/2014 08/29/2014 for hospital-acquired pneumonia based on a CT scan of the chest 08/26/2014 that showed right lower lobe infiltrate but ruled out pulmonary embolism. Readmitted 09/08/2014 through gender 17 2016 for COPD exacerbation. Since these admissions she says she is no longer as functional. ECOG functional status is now f4. She spends most of the day sitting or in bed. Only movement is to the bathroom and back to the bed and maybe to the dining room. These activities make extremely dyspneic that she still barely able to do her functional activities such as changing clothes or taking a shower. These activities make extremely dyspneic. Since discharge from the hospital she's not using her Spiriva because of chest tightness from Spiriva. In fact her husband says that she's not using any of her inhalers. She reports class III-for dyspnea associated with mild cough that is dry.  She's not had any PFTs in a long time according to her history  Chest x-ray 09/08/2014 lung fields are clear  She needed a wheel chair to get into ouroffice due to dyspnea  She is not on o2 at home.  Walking desaturation test 185 feet x 3 laps on RA: did only 3/4 of 1 lap. STOPPED 2 tims. Pulse ox stated at  100%. HR rose to 107/min     has a past medical history of Anxiety; Insomnia; COPD (chronic obstructive pulmonary disease); Polymyalgia rheumatica; Fatty liver disease, nonalcoholic; Scoliosis; Asthma; Depression; History of TIA (transient ischemic attack) (11-01-2010); Hyperlipidemia; Lumbar stenosis; Chronic pain syndrome; Seasonal allergies; Chronic narcotic use; Frequency of urination; Urgency of urination; Nocturia; Vaginal pain (S/P SLING  FEB 2012); Hypertension; OSA (obstructive sleep apnea); Hypothyroidism; IDDM (insulin dependent diabetes mellitus); Daily headache; Osteoarthritis; DJD (degenerative joint disease); Arthritis; Fibromyalgia; Chronic lower back pain; Thyroid cancer; Cervical cancer; Pneumonia ("several times"); and HCAP (healthcare-associated pneumonia) (08/26/2014).   reports that she quit smoking about 3 years ago. Her smoking use included Cigarettes. She has a 97.5 pack-year smoking history. She has never used smokeless tobacco.  Past Surgical History  Procedure Laterality Date  . Appendectomy  1982  . Tubal ligation  1983  . Hysteroscopy w/d&c  08-19-2007    PMB  . Laparoscopic gastric banding  03/01/2006    TRUNCAL VAGOTOMY/ PLACEMENT OF VG BAND  . Knee arthroscopy w/ debridement Left 03/29/2006    INTERNAL DERANGEMENT/ SEVERE DJD/ MENISCUS TEARS  . Transvaginal subureteral tape/ sling  09-28-2010    MIXED URINARY INCONTINENCE  . Revision total knee arthroplasty Left 08-29-2008; 05/2009  . Total knee arthroplasty Left 01-23-2007    SEVERE DJD  . Total thyroidectomy  11-22-2005    BILATERAL THYROID NODULES-- PAPILLARY CARCINOMA (0.5CM)/ ADENOMATOID NODULES  . Breast lumpectomy Left 02-28-2005    ATYPICAL DUCTAL HYPERPLASIA  .  Cardiac catheterization  09-04-2004    NORMAL CORONARY ANATOMY/ NORMAL LVF/ EF 60%  . Laparoscopic cholecystectomy  06-10-2002  . Tonsillectomy  1969  . Transthoracic echocardiogram  12-27-2010    LVSF NORMAL / EF 90-24%/ GRADE I DIASTOLIC  DYSFUNCTION/ MILD MITRAL REGURG. / MILDLY DILATED LEFT ATRIUM/ MILDY INCREASED SYSTOLIC PRESSURE OF PULMONARY ARTERIES  . Cardiovascular stress test  12-27-2010  DR [place]    ABNORMAL NUCLEAR STUDY W/ /MILD INFERIOR ISCHEMIA/ EF 69%/  CT HEART ANGIOGRAM ;  NO ACUTE FINDINGS  . Cystoscopy  05/18/2012    Procedure: CYSTOSCOPY;  Surgeon: Reece Packer, MD;  Location: [facility];  Service: Urology;  Laterality: N/A;  examination under anethesia  . Cryoablation  05/16/2003    w/LEEP FOR ABNORMAL PAP SMEAR    Allergies  Allergen Reactions  . Losartan Other (See Comments)    Myalgias and muscle cramping  . Sulfonamide Derivatives Nausea Only    Immunization History  Administered Date(s) Administered  . Influenza-Unspecified 05/26/2014  . Pneumococcal Polysaccharide-23 07/10/2014    Family History  Problem Relation Age of Onset  . Colon cancer Maternal Uncle     x 2  . Breast cancer Other     great aunts x 5  . Diabetes Mother   . Heart disease Father   . Heart disease Mother      Current outpatient prescriptions:  .  acetaminophen (TYLENOL) 500 MG tablet, Take 500 mg by mouth every 4 (four) hours as needed for moderate pain., Disp: , Rfl:  .  albuterol-ipratropium (COMBIVENT) 18-103 MCG/ACT inhaler, Inhale 1-2 puffs into the lungs every 4 (four) hours as needed for wheezing or shortness of breath., Disp: , Rfl:  .  amitriptyline (ELAVIL) 25 MG tablet, Take 100 mg by mouth at bedtime., Disp: , Rfl:  .  amLODipine (NORVASC) 5 MG tablet, Take 1 tablet (5 mg total) by mouth daily. (Patient taking differently: Take 2.5 mg by mouth daily. ), Disp: 30 tablet, Rfl: 2 .  insulin glargine (LANTUS) 100 UNIT/ML injection, Inject 0.8 mLs (80 Units total) into the skin daily., Disp: 10 mL, Rfl: 11 .  insulin lispro (HUMALOG) 100 UNIT/ML injection, Inject 0.1 mLs (10 Units total) into the skin 3 (three) times daily before meals., Disp: 10 mL, Rfl: 11 .  levothyroxine (SYNTHROID,  LEVOTHROID) 150 MCG tablet, Take 150 mcg by mouth daily before breakfast., Disp: , Rfl:  .  lisinopril (PRINIVIL,ZESTRIL) 10 MG tablet, Take 1 tablet (10 mg total) by mouth daily., Disp: 30 tablet, Rfl: 2 .  morphine (MS CONTIN) 60 MG 12 hr tablet, Take 60 mg by mouth every 12 (twelve) hours., Disp: , Rfl:  .  polyethylene glycol (MIRALAX / GLYCOLAX) packet, Take 17 g by mouth 2 (two) times daily as needed for moderate constipation., Disp: 14 each, Rfl: 0 .  potassium chloride SA (K-DUR,KLOR-CON) 20 MEQ tablet, Take 20 mEq by mouth as needed (for cramping). , Disp: , Rfl: 1 .  albuterol (PROVENTIL HFA;VENTOLIN HFA) 108 (90 BASE) MCG/ACT inhaler, Inhale 2 puffs into the lungs every 6 (six) hours as needed for wheezing or shortness of breath. (Patient not taking: Reported on 09/26/2014), Disp: 1 Inhaler, Rfl: 2 .  cetirizine (ZYRTEC) 10 MG tablet, Take 10 mg by mouth daily as needed for allergies. , Disp: , Rfl:  .  clonazePAM (KLONOPIN) 0.5 MG tablet, Take 0.5 mg by mouth daily as needed for anxiety., Disp: , Rfl:  .  feeding supplement, [place], ([place])  LIQD, Take 237 mLs by mouth 2 (two) times daily between meals. (Patient not taking: Reported on 09/26/2014), Disp: 60 Can, Rfl: 12 .  Spacer/Aero-Holding Chambers (AEROCHAMBER PLUS WITH MASK) inhaler, Use as instructed (Patient not taking: Reported on 09/26/2014), Disp: 1 each, Rfl: 2 .  tiotropium (SPIRIVA HANDIHALER) 18 MCG inhalation capsule, Place 1 capsule (18 mcg total) into inhaler and inhale daily. (Patient not taking: Reported on 09/26/2014), Disp: 30 capsule, Rfl: 12    Review of Systems  Constitutional: Positive for fatigue. Negative for fever and unexpected weight change.  HENT: Negative for congestion, dental problem, ear pain, nosebleeds, postnasal drip, rhinorrhea, sinus pressure, sneezing, sore throat and trouble swallowing.   Eyes: Negative for redness and itching.  Respiratory: Positive for cough and shortness of breath.  Negative for chest tightness and wheezing.   Cardiovascular: Negative for palpitations and leg swelling.  Gastrointestinal: Negative for nausea and vomiting.  Genitourinary: Negative for dysuria.  Musculoskeletal: Negative for joint swelling.  Skin: Negative for rash.  Neurological: Negative for headaches.  Hematological: Does not bruise/bleed easily.  Psychiatric/Behavioral: Negative for dysphoric mood. The patient is not nervous/anxious.        Objective:   Physical Exam  Constitutional: She is oriented to person, place, and time. She appears well-developed and well-nourished. No distress.  Body mass index is 40 kg/(m^2). Morbidly obese  HENT:  Head: Normocephalic and atraumatic.  Right Ear: External ear normal.  Left Ear: External ear normal.  Mouth/Throat: Oropharynx is clear and moist. No oropharyngeal exudate.  Eyes: Conjunctivae and EOM are normal. Pupils are equal, round, and reactive to light. Right eye exhibits no discharge. Left eye exhibits no discharge. No scleral icterus.  Neck: Normal range of motion. Neck supple. No JVD present. No tracheal deviation present. No thyromegaly present.  Cardiovascular: Normal rate, regular rhythm, normal heart sounds and intact distal pulses.  Exam reveals no gallop and no friction rub.   No murmur heard. Pulmonary/Chest: Effort normal and breath sounds normal. No respiratory distress. She has no wheezes. She has no rales. She exhibits no tenderness.  Obese Overall distant air entry  Abdominal: Soft. Bowel sounds are normal. She exhibits no distension and no mass. There is no tenderness. There is no rebound and no guarding.  Musculoskeletal: Normal range of motion. She exhibits no edema or tenderness.  Physically very deconditioned Sitting in wheel chair Slow gait  Lymphadenopathy:    She has no cervical adenopathy.  Neurological: She is alert and oriented to person, place, and time. She has normal reflexes. No cranial nerve deficit.  She exhibits normal muscle tone. Coordination normal.  Skin: Skin is warm and dry. No rash noted. She is not diaphoretic. No erythema. No pallor.  Psychiatric: Judgment and thought content normal.  Anxious Flat affect  Vitals reviewed.   Filed Vitals:   09/26/14 1623  BP: 110/76  Pulse: 94  Height: 5\' 9"  (1.753 m)  Weight: 271 lb (122.925 kg)  SpO2: 94%         Assessment & Plan:     ICD-9-CM ICD-10-CM   1. Chronic obstructive pulmonary disease, unspecified COPD, unspecified chronic bronchitis type 496 J44.9   2. Physical deconditioning 799.3 R53.81   3. Dyspnea 786.09 R06.00    The real issue he has appears to be significant physical deconditioning following 3 admissions between November 2015 and January 2016. She was at high risk for significant physical deconditioning anyways due to her multiple medical problems including obesity, chronic narcotic use and sleep apnea  and underlying COPD not otherwise specified  She is a high risk candidate for COPD given near 100 pack smoking history and self described diagnosis of COPD and an ABG in November 2015 showing a PCO2 of 55. However she is not taking any nebulizer on a regular basis. She has financial issues. Right now empirically will start her on DuoNeb but in 3-4 weeks will get Pulmicort function tests. I did educate her about importance of prophylactic regular scheduled use of bronchodilator therapy    Dr. Brand Males, M.D., [facility].C.P Pulmonary and Critical Care Medicine Staff Physician [facility] Pager: [phone], If no answer or between  15:00h - 7:00h: call [phone]  09/26/2014 5:01 PM

## 2014-09-27 ENCOUNTER — Telehealth: Payer: Self-pay | Admitting: Internal Medicine

## 2014-09-27 DIAGNOSIS — R5381 Other malaise: Secondary | ICD-10-CM

## 2014-09-27 NOTE — Telephone Encounter (Signed)
New order placed. Nothing further needed. 

## 2014-09-28 ENCOUNTER — Telehealth: Payer: Self-pay | Admitting: Internal Medicine

## 2014-09-28 NOTE — Telephone Encounter (Signed)
LM for Mary Acosta at [facility] to return call.

## 2014-09-29 DIAGNOSIS — N390 Urinary tract infection, site not specified: Secondary | ICD-10-CM | POA: Diagnosis not present

## 2014-09-29 DIAGNOSIS — I959 Hypotension, unspecified: Secondary | ICD-10-CM | POA: Diagnosis not present

## 2014-09-29 DIAGNOSIS — N29 Other disorders of kidney and ureter in diseases classified elsewhere: Secondary | ICD-10-CM | POA: Diagnosis not present

## 2014-09-29 DIAGNOSIS — R531 Weakness: Secondary | ICD-10-CM | POA: Diagnosis not present

## 2014-09-29 DIAGNOSIS — I10 Essential (primary) hypertension: Secondary | ICD-10-CM | POA: Diagnosis not present

## 2014-09-29 NOTE — Telephone Encounter (Signed)
Called and spoke to Mary Acosta. Mary Acosta was requesting a recent hgb a1c that was drawn or a new draw. Mary Acosta is needing this lab d/t pt having Medicare and she has DM, Medicare requires this lab upon admission. Recent Hgb A1c was drawn on 07/09/14 with a results of 10.3. Mary Acosta stated that is all that is needed for pt to begin PT. Nothing further needed at this time.

## 2014-10-03 ENCOUNTER — Telehealth: Payer: Self-pay | Admitting: Internal Medicine

## 2014-10-03 DIAGNOSIS — R5381 Other malaise: Secondary | ICD-10-CM | POA: Diagnosis not present

## 2014-10-03 DIAGNOSIS — M353 Polymyalgia rheumatica: Secondary | ICD-10-CM | POA: Diagnosis not present

## 2014-10-03 DIAGNOSIS — Z993 Dependence on wheelchair: Secondary | ICD-10-CM | POA: Diagnosis not present

## 2014-10-03 DIAGNOSIS — F341 Dysthymic disorder: Secondary | ICD-10-CM | POA: Diagnosis not present

## 2014-10-03 DIAGNOSIS — E1165 Type 2 diabetes mellitus with hyperglycemia: Secondary | ICD-10-CM | POA: Diagnosis not present

## 2014-10-03 DIAGNOSIS — Z720 Tobacco use: Secondary | ICD-10-CM | POA: Diagnosis not present

## 2014-10-03 DIAGNOSIS — Z794 Long term (current) use of insulin: Secondary | ICD-10-CM | POA: Diagnosis not present

## 2014-10-03 DIAGNOSIS — J189 Pneumonia, unspecified organism: Secondary | ICD-10-CM | POA: Diagnosis not present

## 2014-10-03 DIAGNOSIS — G629 Polyneuropathy, unspecified: Secondary | ICD-10-CM | POA: Diagnosis not present

## 2014-10-03 NOTE — Telephone Encounter (Signed)
That is fine 

## 2014-10-03 NOTE — Telephone Encounter (Signed)
Please advise MR thanks 

## 2014-10-04 NOTE — Telephone Encounter (Signed)
LM with Mary Acosta [facility] to return call

## 2014-10-04 NOTE — Telephone Encounter (Signed)
Spoke with Mary Acosta, gave the verbal order for OT.  Nothing further needed.

## 2014-10-04 NOTE — Telephone Encounter (Signed)
Mary Acosta calling back [phone]

## 2014-10-07 DIAGNOSIS — G629 Polyneuropathy, unspecified: Secondary | ICD-10-CM | POA: Diagnosis not present

## 2014-10-07 DIAGNOSIS — E1165 Type 2 diabetes mellitus with hyperglycemia: Secondary | ICD-10-CM | POA: Diagnosis not present

## 2014-10-07 DIAGNOSIS — R5381 Other malaise: Secondary | ICD-10-CM | POA: Diagnosis not present

## 2014-10-07 DIAGNOSIS — F341 Dysthymic disorder: Secondary | ICD-10-CM | POA: Diagnosis not present

## 2014-10-07 DIAGNOSIS — J189 Pneumonia, unspecified organism: Secondary | ICD-10-CM | POA: Diagnosis not present

## 2014-10-07 DIAGNOSIS — M353 Polymyalgia rheumatica: Secondary | ICD-10-CM | POA: Diagnosis not present

## 2014-10-12 DIAGNOSIS — G629 Polyneuropathy, unspecified: Secondary | ICD-10-CM | POA: Diagnosis not present

## 2014-10-12 DIAGNOSIS — E1165 Type 2 diabetes mellitus with hyperglycemia: Secondary | ICD-10-CM | POA: Diagnosis not present

## 2014-10-12 DIAGNOSIS — M353 Polymyalgia rheumatica: Secondary | ICD-10-CM | POA: Diagnosis not present

## 2014-10-12 DIAGNOSIS — F341 Dysthymic disorder: Secondary | ICD-10-CM | POA: Diagnosis not present

## 2014-10-12 DIAGNOSIS — J189 Pneumonia, unspecified organism: Secondary | ICD-10-CM | POA: Diagnosis not present

## 2014-10-12 DIAGNOSIS — R5381 Other malaise: Secondary | ICD-10-CM | POA: Diagnosis not present

## 2014-10-13 ENCOUNTER — Telehealth: Payer: Self-pay

## 2014-10-13 ENCOUNTER — Telehealth: Payer: Self-pay | Admitting: Internal Medicine

## 2014-10-13 DIAGNOSIS — J441 Chronic obstructive pulmonary disease with (acute) exacerbation: Secondary | ICD-10-CM

## 2014-10-13 NOTE — Telephone Encounter (Signed)
That is a general medicine order that should go to Geronimo Boot, MD

## 2014-10-13 NOTE — Telephone Encounter (Signed)
PA done. ID # for PA is [id number] Will forward to elise

## 2014-10-13 NOTE — Telephone Encounter (Signed)
Called [phone] to initiate PA for pt's duoneb.  I was not able to do this over the phone.  The PA form is being faxed to the office, verified fax #.  Will hold initial PA request form in triage box until this is received.

## 2014-10-13 NOTE — Telephone Encounter (Signed)
Called and spoke to pt's husband. Informed pt's husband of the approval. Pt's husband verbalized understanding and denied any further questions or concerns at this time.

## 2014-10-13 NOTE — Telephone Encounter (Signed)
Called made tracy aware. Nothing further needed

## 2014-10-13 NOTE — Telephone Encounter (Signed)
Spoke with Mary Acosta [facility] nurse). She is requesting VO for hg A1C. She reports per medicare they need new one every 90 days. Please advise thanks

## 2014-10-14 DIAGNOSIS — R5381 Other malaise: Secondary | ICD-10-CM | POA: Diagnosis not present

## 2014-10-14 DIAGNOSIS — E1165 Type 2 diabetes mellitus with hyperglycemia: Secondary | ICD-10-CM | POA: Diagnosis not present

## 2014-10-14 DIAGNOSIS — F341 Dysthymic disorder: Secondary | ICD-10-CM | POA: Diagnosis not present

## 2014-10-14 DIAGNOSIS — M353 Polymyalgia rheumatica: Secondary | ICD-10-CM | POA: Diagnosis not present

## 2014-10-14 DIAGNOSIS — G629 Polyneuropathy, unspecified: Secondary | ICD-10-CM | POA: Diagnosis not present

## 2014-10-14 DIAGNOSIS — J189 Pneumonia, unspecified organism: Secondary | ICD-10-CM | POA: Diagnosis not present

## 2014-10-17 DIAGNOSIS — J189 Pneumonia, unspecified organism: Secondary | ICD-10-CM | POA: Diagnosis not present

## 2014-10-17 DIAGNOSIS — E1165 Type 2 diabetes mellitus with hyperglycemia: Secondary | ICD-10-CM | POA: Diagnosis not present

## 2014-10-17 DIAGNOSIS — R5381 Other malaise: Secondary | ICD-10-CM | POA: Diagnosis not present

## 2014-10-17 DIAGNOSIS — F341 Dysthymic disorder: Secondary | ICD-10-CM | POA: Diagnosis not present

## 2014-10-17 DIAGNOSIS — M353 Polymyalgia rheumatica: Secondary | ICD-10-CM | POA: Diagnosis not present

## 2014-10-17 DIAGNOSIS — G629 Polyneuropathy, unspecified: Secondary | ICD-10-CM | POA: Diagnosis not present

## 2014-10-19 ENCOUNTER — Telehealth: Payer: Self-pay | Admitting: Internal Medicine

## 2014-10-19 DIAGNOSIS — J189 Pneumonia, unspecified organism: Secondary | ICD-10-CM | POA: Diagnosis not present

## 2014-10-19 DIAGNOSIS — R5381 Other malaise: Secondary | ICD-10-CM | POA: Diagnosis not present

## 2014-10-19 DIAGNOSIS — F341 Dysthymic disorder: Secondary | ICD-10-CM | POA: Diagnosis not present

## 2014-10-19 DIAGNOSIS — G629 Polyneuropathy, unspecified: Secondary | ICD-10-CM | POA: Diagnosis not present

## 2014-10-19 DIAGNOSIS — E1165 Type 2 diabetes mellitus with hyperglycemia: Secondary | ICD-10-CM | POA: Diagnosis not present

## 2014-10-19 DIAGNOSIS — M353 Polymyalgia rheumatica: Secondary | ICD-10-CM | POA: Diagnosis not present

## 2014-10-19 NOTE — Telephone Encounter (Signed)
lmtcb for [facility] with [facility]

## 2014-10-20 DIAGNOSIS — M353 Polymyalgia rheumatica: Secondary | ICD-10-CM | POA: Diagnosis not present

## 2014-10-20 DIAGNOSIS — J189 Pneumonia, unspecified organism: Secondary | ICD-10-CM | POA: Diagnosis not present

## 2014-10-20 DIAGNOSIS — R5381 Other malaise: Secondary | ICD-10-CM | POA: Diagnosis not present

## 2014-10-20 DIAGNOSIS — G629 Polyneuropathy, unspecified: Secondary | ICD-10-CM | POA: Diagnosis not present

## 2014-10-20 DIAGNOSIS — E1165 Type 2 diabetes mellitus with hyperglycemia: Secondary | ICD-10-CM | POA: Diagnosis not present

## 2014-10-20 DIAGNOSIS — F341 Dysthymic disorder: Secondary | ICD-10-CM | POA: Diagnosis not present

## 2014-10-20 MED ORDER — IPRATROPIUM-ALBUTEROL 0.5-2.5 (3) MG/3ML IN SOLN: 3.0000 mL | Freq: Four times a day (QID) | RESPIRATORY_TRACT | Status: DC

## 2014-10-20 NOTE — Telephone Encounter (Addendum)
Received fax stated PA was denied d/t med needing filled at [facility]. Rx printed and faxed to [facility] per [facility]. Called and spoke to pt to make her aware. Nothing further needed at this time.

## 2014-10-20 NOTE — Addendum Note (Signed)
Addended by: Maurice March on: 10/20/2014 05:29 PM   Modules accepted: Orders

## 2014-10-20 NOTE — Telephone Encounter (Signed)
Noted  

## 2014-10-20 NOTE — Telephone Encounter (Signed)
Spoke with Stanton Kidney. Wanted MR to be aware that pt fell on 10/15/14. She didn't injury herself but wanted [place] to know about this.

## 2014-10-21 DIAGNOSIS — E1165 Type 2 diabetes mellitus with hyperglycemia: Secondary | ICD-10-CM | POA: Diagnosis not present

## 2014-10-21 DIAGNOSIS — F341 Dysthymic disorder: Secondary | ICD-10-CM | POA: Diagnosis not present

## 2014-10-21 DIAGNOSIS — R5381 Other malaise: Secondary | ICD-10-CM | POA: Diagnosis not present

## 2014-10-21 DIAGNOSIS — G629 Polyneuropathy, unspecified: Secondary | ICD-10-CM | POA: Diagnosis not present

## 2014-10-21 DIAGNOSIS — M353 Polymyalgia rheumatica: Secondary | ICD-10-CM | POA: Diagnosis not present

## 2014-10-21 DIAGNOSIS — J189 Pneumonia, unspecified organism: Secondary | ICD-10-CM | POA: Diagnosis not present

## 2014-10-25 DIAGNOSIS — J189 Pneumonia, unspecified organism: Secondary | ICD-10-CM | POA: Diagnosis not present

## 2014-10-25 DIAGNOSIS — M353 Polymyalgia rheumatica: Secondary | ICD-10-CM | POA: Diagnosis not present

## 2014-10-25 DIAGNOSIS — F341 Dysthymic disorder: Secondary | ICD-10-CM | POA: Diagnosis not present

## 2014-10-25 DIAGNOSIS — R5381 Other malaise: Secondary | ICD-10-CM | POA: Diagnosis not present

## 2014-10-25 DIAGNOSIS — E1165 Type 2 diabetes mellitus with hyperglycemia: Secondary | ICD-10-CM | POA: Diagnosis not present

## 2014-10-25 DIAGNOSIS — G629 Polyneuropathy, unspecified: Secondary | ICD-10-CM | POA: Diagnosis not present

## 2014-10-26 DIAGNOSIS — F341 Dysthymic disorder: Secondary | ICD-10-CM | POA: Diagnosis not present

## 2014-10-26 DIAGNOSIS — M353 Polymyalgia rheumatica: Secondary | ICD-10-CM | POA: Diagnosis not present

## 2014-10-26 DIAGNOSIS — E1165 Type 2 diabetes mellitus with hyperglycemia: Secondary | ICD-10-CM | POA: Diagnosis not present

## 2014-10-26 DIAGNOSIS — G629 Polyneuropathy, unspecified: Secondary | ICD-10-CM | POA: Diagnosis not present

## 2014-10-26 DIAGNOSIS — J189 Pneumonia, unspecified organism: Secondary | ICD-10-CM | POA: Diagnosis not present

## 2014-10-26 DIAGNOSIS — R5381 Other malaise: Secondary | ICD-10-CM | POA: Diagnosis not present

## 2014-10-27 ENCOUNTER — Encounter: Payer: Medicare Other | Admitting: Adult Health

## 2014-10-27 ENCOUNTER — Ambulatory Visit: Payer: Medicare Other | Admitting: Adult Health

## 2014-10-27 ENCOUNTER — Telehealth: Payer: Self-pay | Admitting: Internal Medicine

## 2014-10-27 DIAGNOSIS — F341 Dysthymic disorder: Secondary | ICD-10-CM | POA: Diagnosis not present

## 2014-10-27 DIAGNOSIS — R5381 Other malaise: Secondary | ICD-10-CM | POA: Diagnosis not present

## 2014-10-27 DIAGNOSIS — M353 Polymyalgia rheumatica: Secondary | ICD-10-CM | POA: Diagnosis not present

## 2014-10-27 DIAGNOSIS — G629 Polyneuropathy, unspecified: Secondary | ICD-10-CM | POA: Diagnosis not present

## 2014-10-27 DIAGNOSIS — E1165 Type 2 diabetes mellitus with hyperglycemia: Secondary | ICD-10-CM | POA: Diagnosis not present

## 2014-10-27 DIAGNOSIS — J189 Pneumonia, unspecified organism: Secondary | ICD-10-CM | POA: Diagnosis not present

## 2014-10-27 NOTE — Telephone Encounter (Signed)
Please advise MR thanks 

## 2014-10-28 DIAGNOSIS — M353 Polymyalgia rheumatica: Secondary | ICD-10-CM | POA: Diagnosis not present

## 2014-10-28 DIAGNOSIS — F341 Dysthymic disorder: Secondary | ICD-10-CM | POA: Diagnosis not present

## 2014-10-28 DIAGNOSIS — E1165 Type 2 diabetes mellitus with hyperglycemia: Secondary | ICD-10-CM | POA: Diagnosis not present

## 2014-10-28 DIAGNOSIS — G629 Polyneuropathy, unspecified: Secondary | ICD-10-CM | POA: Diagnosis not present

## 2014-10-28 DIAGNOSIS — R5381 Other malaise: Secondary | ICD-10-CM | POA: Diagnosis not present

## 2014-10-28 DIAGNOSIS — J189 Pneumonia, unspecified organism: Secondary | ICD-10-CM | POA: Diagnosis not present

## 2014-10-28 NOTE — Telephone Encounter (Signed)
Extend pt

## 2014-10-28 NOTE — Telephone Encounter (Signed)
Called and spoke to pt. Informed Mary Acosta of the extension of PT per MR. [place] verbalized understanding and denied any further questions or concerns at this time.

## 2014-10-31 DIAGNOSIS — R5381 Other malaise: Secondary | ICD-10-CM | POA: Diagnosis not present

## 2014-10-31 DIAGNOSIS — F341 Dysthymic disorder: Secondary | ICD-10-CM | POA: Diagnosis not present

## 2014-10-31 DIAGNOSIS — J189 Pneumonia, unspecified organism: Secondary | ICD-10-CM | POA: Diagnosis not present

## 2014-10-31 DIAGNOSIS — M353 Polymyalgia rheumatica: Secondary | ICD-10-CM | POA: Diagnosis not present

## 2014-10-31 DIAGNOSIS — G629 Polyneuropathy, unspecified: Secondary | ICD-10-CM | POA: Diagnosis not present

## 2014-10-31 DIAGNOSIS — E1165 Type 2 diabetes mellitus with hyperglycemia: Secondary | ICD-10-CM | POA: Diagnosis not present

## 2014-11-01 ENCOUNTER — Ambulatory Visit (INDEPENDENT_AMBULATORY_CARE_PROVIDER_SITE_OTHER): Payer: Medicare Other | Admitting: Adult Health

## 2014-11-01 ENCOUNTER — Ambulatory Visit (INDEPENDENT_AMBULATORY_CARE_PROVIDER_SITE_OTHER): Payer: Medicare Other | Admitting: Internal Medicine

## 2014-11-01 ENCOUNTER — Encounter: Payer: Self-pay | Admitting: Adult Health

## 2014-11-01 VITALS — BP 126/80 | HR 89 | Temp 98.1°F | Ht 70.0 in | Wt 291.0 lb

## 2014-11-01 DIAGNOSIS — J449 Chronic obstructive pulmonary disease, unspecified: Secondary | ICD-10-CM

## 2014-11-01 DIAGNOSIS — G4730 Sleep apnea, unspecified: Secondary | ICD-10-CM

## 2014-11-01 LAB — PULMONARY FUNCTION TEST
DL/VA % pred: 66 %
DL/VA: 3.62 ml/min/mmHg/L
DLCO UNC % PRED: 57 %
DLCO unc: 18.56 ml/min/mmHg
FEF 25-75 Post: 2.96 L/sec
FEF 25-75 Pre: 2.82 L/sec
FEF2575-%Change-Post: 5 %
FEF2575-%Pred-Post: 106 %
FEF2575-%Pred-Pre: 101 %
FEV1-%CHANGE-POST: 0 %
FEV1-%Pred-Post: 83 %
FEV1-%Pred-Pre: 82 %
FEV1-PRE: 2.65 L
FEV1-Post: 2.67 L
FEV1FVC-%Change-Post: 2 %
FEV1FVC-%PRED-PRE: 104 %
FEV6-%Change-Post: -1 %
FEV6-%PRED-PRE: 81 %
FEV6-%Pred-Post: 80 %
FEV6-POST: 3.19 L
FEV6-PRE: 3.24 L
FEV6FVC-%CHANGE-POST: 0 %
FEV6FVC-%PRED-PRE: 102 %
FEV6FVC-%Pred-Post: 103 %
FVC-%Change-Post: -2 %
FVC-%PRED-PRE: 79 %
FVC-%Pred-Post: 77 %
FVC-POST: 3.19 L
FVC-PRE: 3.26 L
POST FEV6/FVC RATIO: 100 %
Post FEV1/FVC ratio: 84 %
Pre FEV1/FVC ratio: 81 %
Pre FEV6/FVC Ratio: 100 %
RV % PRED: 81 %
RV: 1.85 L
TLC % pred: 87 %
TLC: 5.23 L

## 2014-11-01 NOTE — Assessment & Plan Note (Signed)
PFT today does not show any evidence of airflow obstruction . Very little restriction with FVC at 79% She does have a decreased diffusing capacity, recent CT chest  showed no pulmonary embolism, she did have a pneumonia. However, follow-up chest x-ray showed clearance. Would change her ACE inhibitor as it may contribute to upper airway symptoms. We'll leave this to her primary care physician She may use DuoNeb nebulizer as needed. Weight loss is imperative Suspect her dyspnea is multifactorial with morbid obesity, deconditioning, polypharmacy with chronic narcotics use , due to chronic pain syndrome. She is encouraged to continue with physical therapy and weight loss  Plan  We are setting you up for overnight oximetry test.  May continue to use Duoneb As needed   Discuss with your family doctor that Lisinopril make you cough or wheeze, may need to change to different medicine to avoid this.  follow up Dr. Chase Caller in 6-8 weeks and As needed

## 2014-11-01 NOTE — Patient Instructions (Signed)
We are setting you up for overnight oximetry test.  May continue to use Duoneb As needed   Discuss with your family doctor that Lisinopril make you cough or wheeze, may need to change to different medicine to avoid this.  follow up Dr. Chase Caller in 6-8 weeks and As needed

## 2014-11-01 NOTE — Assessment & Plan Note (Signed)
?   OSA returning with wt increase  Check ONO  Wt loss

## 2014-11-01 NOTE — Progress Notes (Signed)
Subjective:    Patient ID: Mary Acosta, female    DOB: March 04, 1956, 59 y.o.   MRN: [id number]  PCP Thressa Sheller, MD   HPI  IOV 09/26/2014  Chief Complaint  Patient presents with  . Pulmonary Consult    Pt referred by Dr. Sherald Barge for pneumonia.     59 year-old morbidly obese female with multiple medical problems including obesity, chronic pain, diabetes says has a long history of COPD not otherwise specified and quit smoking 4 years ago [close to 100 pack smoking history]. At baseline prior to December 2015 she says she was quite functional without much of dyspnea for activities of daily living. Then in mid November 2015 had an admission for hyperglycemia. This was followed by an admission 08/26/2014 08/29/2014 for hospital-acquired pneumonia based on a CT scan of the chest 08/26/2014 that showed right lower lobe infiltrate but ruled out pulmonary embolism. Readmitted 09/08/2014 through gender 17 2016 for COPD exacerbation. Since these admissions she says she is no longer as functional. ECOG functional status is now f4. She spends most of the day sitting or in bed. Only movement is to the bathroom and back to the bed and maybe to the dining room. These activities make extremely dyspneic that she still barely able to do her functional activities such as changing clothes or taking a shower. These activities make extremely dyspneic. Since discharge from the hospital she's not using her Spiriva because of chest tightness from Spiriva. In fact her husband says that she's not using any of her inhalers. She reports class III-for dyspnea associated with mild cough that is dry.  She's not had any PFTs in a long time according to her history  Chest x-ray 09/08/2014 lung fields are clear  She needed a wheel chair to get into ouroffice due to dyspnea  She is not on o2 at home.  Walking desaturation test 185 feet x 3 laps on RA: did only 3/4 of 1 lap. STOPPED 2 tims. Pulse ox stated at  100%. HR rose to 107/min  11/01/2014 Follow up PFT /COPD? Patient returns for a follow-up visit and review PFT Patient was seen one month ago for pulmonary consultation for possible COPD. She is a former smoker had a recent hospital. They should for hospital-acquired pneumonia , and COPD exacerbation. Patient had persistent shortness of breath. He had no desaturations with walking in office. PFTs today showed an FEV1 of 82%, ratio 81, FVC at 79%, no significant bronchodilator response, decreased diffusing capacity is 57%. Patient is on an ACE inhibitor, chronic narcotics, and is mildly obese with weight at 291. Patient had a CT chest in January 2016 was negative for pulmonary embolism, showed a right lower lobe pneumonia, follow-up chest x-ray was negative 2-D echo done in January showed severe LVH, EF 55% , GR 1 DD  Tried spiriva in past, intolerant.  Started on Duoneb feels it helps her breathing.  Says she was rechecked for sleep apnea in past  and test was neg.  Previous lap band with loss of 60lbs (high wt was 346) . Wt starting to tr up.  Has chronic pain .-back, knee  and FM  Started on PT last ov. Hard on her knees.  She denies any chest pain, orthopnea, PND, or increased leg swelling     Review of Systems   Constitutional:   No  weight loss, night sweats,  Fevers, chills, + fatigue, or  lassitude.  HEENT:   No headaches,  Difficulty swallowing,  Tooth/dental problems, or  Sore throat,                No sneezing, itching, ear ache, nasal congestion, post nasal drip,   CV:  No chest pain,  Orthopnea, PND, swelling in lower extremities, anasarca, dizziness, palpitations, syncope.   GI  No heartburn, indigestion, abdominal pain, nausea, vomiting, diarrhea, change in bowel habits, loss of appetite, bloody stools.   Resp: .  No chest wall deformity  Skin: no rash or lesions.  GU: no dysuria, change in color of urine, no urgency or frequency.  No flank pain, no hematuria   MS:   No joint pain or swelling.  No decreased range of motion.  No back pain.  Psych:  No change in mood or affect. No depression or anxiety.  No memory loss.         Objective:   Physical Exam  GEN: A/Ox3; pleasant , NAD, obese   HEENT:  [place]/AT,  EACs-clear, TMs-wnl, NOSE-clear, THROAT-clear, no lesions, no postnasal drip or exudate noted.   NECK:  Supple w/ fair ROM; no JVD; normal carotid impulses w/o bruits; no thyromegaly or nodules palpated; no lymphadenopathy.  RESP  Decreased BS in bases no accessory muscle use, no dullness to percussion  CARD:  RRR, no m/r/g  , tr  peripheral edema, pulses intact, no cyanosis or clubbing.  GI:   Soft & nt; nml bowel sounds; no organomegaly or masses detected.  Musco: Warm bil, no deformities or joint swelling noted.   Neuro: alert, no focal deficits noted.    Skin: Warm, no lesions or rashes

## 2014-11-01 NOTE — Progress Notes (Signed)
PFT done today. 

## 2014-11-02 DIAGNOSIS — F341 Dysthymic disorder: Secondary | ICD-10-CM | POA: Diagnosis not present

## 2014-11-02 DIAGNOSIS — M353 Polymyalgia rheumatica: Secondary | ICD-10-CM | POA: Diagnosis not present

## 2014-11-02 DIAGNOSIS — E1165 Type 2 diabetes mellitus with hyperglycemia: Secondary | ICD-10-CM | POA: Diagnosis not present

## 2014-11-02 DIAGNOSIS — J189 Pneumonia, unspecified organism: Secondary | ICD-10-CM | POA: Diagnosis not present

## 2014-11-02 DIAGNOSIS — G629 Polyneuropathy, unspecified: Secondary | ICD-10-CM | POA: Diagnosis not present

## 2014-11-02 DIAGNOSIS — R5381 Other malaise: Secondary | ICD-10-CM | POA: Diagnosis not present

## 2014-11-03 DIAGNOSIS — F341 Dysthymic disorder: Secondary | ICD-10-CM | POA: Diagnosis not present

## 2014-11-03 DIAGNOSIS — R5381 Other malaise: Secondary | ICD-10-CM | POA: Diagnosis not present

## 2014-11-03 DIAGNOSIS — G629 Polyneuropathy, unspecified: Secondary | ICD-10-CM | POA: Diagnosis not present

## 2014-11-03 DIAGNOSIS — M353 Polymyalgia rheumatica: Secondary | ICD-10-CM | POA: Diagnosis not present

## 2014-11-03 DIAGNOSIS — J189 Pneumonia, unspecified organism: Secondary | ICD-10-CM | POA: Diagnosis not present

## 2014-11-03 DIAGNOSIS — E1165 Type 2 diabetes mellitus with hyperglycemia: Secondary | ICD-10-CM | POA: Diagnosis not present

## 2014-11-04 DIAGNOSIS — I959 Hypotension, unspecified: Secondary | ICD-10-CM | POA: Diagnosis not present

## 2014-11-04 DIAGNOSIS — E1165 Type 2 diabetes mellitus with hyperglycemia: Secondary | ICD-10-CM | POA: Diagnosis not present

## 2014-11-04 DIAGNOSIS — I10 Essential (primary) hypertension: Secondary | ICD-10-CM | POA: Diagnosis not present

## 2014-11-04 DIAGNOSIS — R531 Weakness: Secondary | ICD-10-CM | POA: Diagnosis not present

## 2014-11-07 DIAGNOSIS — M353 Polymyalgia rheumatica: Secondary | ICD-10-CM | POA: Diagnosis not present

## 2014-11-07 DIAGNOSIS — G629 Polyneuropathy, unspecified: Secondary | ICD-10-CM | POA: Diagnosis not present

## 2014-11-07 DIAGNOSIS — J189 Pneumonia, unspecified organism: Secondary | ICD-10-CM | POA: Diagnosis not present

## 2014-11-07 DIAGNOSIS — E1165 Type 2 diabetes mellitus with hyperglycemia: Secondary | ICD-10-CM | POA: Diagnosis not present

## 2014-11-07 DIAGNOSIS — R5381 Other malaise: Secondary | ICD-10-CM | POA: Diagnosis not present

## 2014-11-07 DIAGNOSIS — F341 Dysthymic disorder: Secondary | ICD-10-CM | POA: Diagnosis not present

## 2014-11-07 DIAGNOSIS — J449 Chronic obstructive pulmonary disease, unspecified: Secondary | ICD-10-CM | POA: Diagnosis not present

## 2014-11-08 DIAGNOSIS — E1165 Type 2 diabetes mellitus with hyperglycemia: Secondary | ICD-10-CM | POA: Diagnosis not present

## 2014-11-08 DIAGNOSIS — J189 Pneumonia, unspecified organism: Secondary | ICD-10-CM | POA: Diagnosis not present

## 2014-11-08 DIAGNOSIS — R5381 Other malaise: Secondary | ICD-10-CM | POA: Diagnosis not present

## 2014-11-08 DIAGNOSIS — M353 Polymyalgia rheumatica: Secondary | ICD-10-CM | POA: Diagnosis not present

## 2014-11-08 DIAGNOSIS — F341 Dysthymic disorder: Secondary | ICD-10-CM | POA: Diagnosis not present

## 2014-11-08 DIAGNOSIS — G629 Polyneuropathy, unspecified: Secondary | ICD-10-CM | POA: Diagnosis not present

## 2014-11-09 DIAGNOSIS — M353 Polymyalgia rheumatica: Secondary | ICD-10-CM | POA: Diagnosis not present

## 2014-11-09 DIAGNOSIS — G629 Polyneuropathy, unspecified: Secondary | ICD-10-CM | POA: Diagnosis not present

## 2014-11-09 DIAGNOSIS — F341 Dysthymic disorder: Secondary | ICD-10-CM | POA: Diagnosis not present

## 2014-11-09 DIAGNOSIS — R5381 Other malaise: Secondary | ICD-10-CM | POA: Diagnosis not present

## 2014-11-09 DIAGNOSIS — E1165 Type 2 diabetes mellitus with hyperglycemia: Secondary | ICD-10-CM | POA: Diagnosis not present

## 2014-11-09 DIAGNOSIS — J189 Pneumonia, unspecified organism: Secondary | ICD-10-CM | POA: Diagnosis not present

## 2014-11-10 ENCOUNTER — Encounter: Payer: Self-pay | Admitting: Adult Health

## 2014-11-10 ENCOUNTER — Telehealth: Payer: Self-pay | Admitting: Adult Health

## 2014-11-10 DIAGNOSIS — G894 Chronic pain syndrome: Secondary | ICD-10-CM | POA: Diagnosis not present

## 2014-11-10 DIAGNOSIS — J449 Chronic obstructive pulmonary disease, unspecified: Secondary | ICD-10-CM

## 2014-11-10 DIAGNOSIS — M533 Sacrococcygeal disorders, not elsewhere classified: Secondary | ICD-10-CM | POA: Diagnosis not present

## 2014-11-10 DIAGNOSIS — M5137 Other intervertebral disc degeneration, lumbosacral region: Secondary | ICD-10-CM | POA: Diagnosis not present

## 2014-11-10 DIAGNOSIS — M25561 Pain in right knee: Secondary | ICD-10-CM | POA: Diagnosis not present

## 2014-11-10 DIAGNOSIS — J9610 Chronic respiratory failure, unspecified whether with hypoxia or hypercapnia: Secondary | ICD-10-CM | POA: Insufficient documentation

## 2014-11-10 NOTE — Telephone Encounter (Signed)
Pt seen by TP 3.8.16 ONO on room air ordered  ONO results received  Per TP: severe desats.  Begin O2 at 3lpm at bedtime.  Needs split night sleep study and will need repeat ONO on oxygen once set up.  LMOM TCB x1 for pt on her home number ATC pt x1 on cell number, line rang numerous times then went busy. WCB.  STAT order placed for new O2 start, the split night and repeat ONO on 3lpm

## 2014-11-11 ENCOUNTER — Ambulatory Visit (HOSPITAL_BASED_OUTPATIENT_CLINIC_OR_DEPARTMENT_OTHER): Payer: Medicare Other | Attending: Adult Health | Admitting: *Deleted

## 2014-11-11 VITALS — Ht 70.0 in | Wt 291.0 lb

## 2014-11-11 DIAGNOSIS — J441 Chronic obstructive pulmonary disease with (acute) exacerbation: Secondary | ICD-10-CM

## 2014-11-11 DIAGNOSIS — R5381 Other malaise: Secondary | ICD-10-CM | POA: Diagnosis not present

## 2014-11-11 DIAGNOSIS — M353 Polymyalgia rheumatica: Secondary | ICD-10-CM | POA: Diagnosis not present

## 2014-11-11 DIAGNOSIS — G4710 Hypersomnia, unspecified: Secondary | ICD-10-CM | POA: Diagnosis not present

## 2014-11-11 DIAGNOSIS — J449 Chronic obstructive pulmonary disease, unspecified: Secondary | ICD-10-CM

## 2014-11-11 DIAGNOSIS — F341 Dysthymic disorder: Secondary | ICD-10-CM | POA: Diagnosis not present

## 2014-11-11 DIAGNOSIS — G629 Polyneuropathy, unspecified: Secondary | ICD-10-CM | POA: Diagnosis not present

## 2014-11-11 DIAGNOSIS — E1165 Type 2 diabetes mellitus with hyperglycemia: Secondary | ICD-10-CM | POA: Diagnosis not present

## 2014-11-11 DIAGNOSIS — J189 Pneumonia, unspecified organism: Secondary | ICD-10-CM | POA: Diagnosis not present

## 2014-11-11 NOTE — Telephone Encounter (Signed)
Pt returned call Discussed ONO results and advised of TP's recommendations Pt voiced her understanding and reported that the nocturnal O2 was already set up She is aware she will be contacted again to schedule sleep study  Nothing further needed at this time, will sign off

## 2014-11-11 NOTE — Telephone Encounter (Signed)
[phone] calling back

## 2014-11-16 DIAGNOSIS — M353 Polymyalgia rheumatica: Secondary | ICD-10-CM | POA: Diagnosis not present

## 2014-11-16 DIAGNOSIS — F341 Dysthymic disorder: Secondary | ICD-10-CM | POA: Diagnosis not present

## 2014-11-16 DIAGNOSIS — G609 Hereditary and idiopathic neuropathy, unspecified: Secondary | ICD-10-CM | POA: Diagnosis not present

## 2014-11-16 DIAGNOSIS — G629 Polyneuropathy, unspecified: Secondary | ICD-10-CM | POA: Diagnosis not present

## 2014-11-16 DIAGNOSIS — E1165 Type 2 diabetes mellitus with hyperglycemia: Secondary | ICD-10-CM | POA: Diagnosis not present

## 2014-11-16 DIAGNOSIS — R5381 Other malaise: Secondary | ICD-10-CM | POA: Diagnosis not present

## 2014-11-16 DIAGNOSIS — I10 Essential (primary) hypertension: Secondary | ICD-10-CM | POA: Diagnosis not present

## 2014-11-16 DIAGNOSIS — E890 Postprocedural hypothyroidism: Secondary | ICD-10-CM | POA: Diagnosis not present

## 2014-11-16 DIAGNOSIS — J189 Pneumonia, unspecified organism: Secondary | ICD-10-CM | POA: Diagnosis not present

## 2014-11-17 DIAGNOSIS — G4733 Obstructive sleep apnea (adult) (pediatric): Secondary | ICD-10-CM | POA: Diagnosis not present

## 2014-11-17 NOTE — Sleep Study (Signed)
   NAME: Mary Acosta DATE OF BIRTH:  30-Jan-1956 MEDICAL RECORD NUMBER [id number]  [facility]  PHYSICIAN: Kathee Delton  DATE OF STUDY: 11/11/2014  SLEEP STUDY TYPE: Nocturnal Polysomnogram               REFERRING PHYSICIAN: Parrett, Tammy S, NP  INDICATION FOR STUDY: Hypersomnia with sleep apnea  EPWORTH SLEEPINESS SCORE:  2 HEIGHT: 5\' 10"  (177.8 cm)  WEIGHT: 291 lb (131.997 kg)    Body mass index is 41.75 kg/(m^2).  NECK SIZE: 17.5 in.  MEDICATIONS: reviewed in the sleep record  SLEEP ARCHITECTURE: the patient had a total sleep time of 329 min with no slow wave sleep or REM.  Sleep onset latency was normal, and sleep efficiency was mildly reduced.   RESPIRATORY DATA: The pt had no apneas and only 2 hypopneas for an AHI of 0.4 events/hr.  There was mild snoring noted  OXYGEN DATA: There was oxygen desaturation as low as 82% during the night, and oxygen was added at one liter at 2336 hrs due to desaturations into the 80's.    CARDIAC DATA: no arrhythmias were noted.  MOVEMENT/PARASOMNIA: no leg movements or abnormal behaviors were noted.  IMPRESSION/ RECOMMENDATION:    1) no clinically significant sleep apnea noted.  The patient did not achieve REM, but had more than ample opportunity to exhibit sleep disordered breathing.    2) oxygen desaturation noted during the night to 82%, and resolved with one liter of oxygen.  Would recommend ONO to see if patient has significant time in desaturation to warrant supplemental oxygen.     [place], American Board of Sleep Medicine  ELECTRONICALLY SIGNED ON:  11/17/2014, 1:26 PM [place] PH: [phone]   FX: [phone] [place]

## 2014-11-18 DIAGNOSIS — R5381 Other malaise: Secondary | ICD-10-CM | POA: Diagnosis not present

## 2014-11-18 DIAGNOSIS — E1165 Type 2 diabetes mellitus with hyperglycemia: Secondary | ICD-10-CM | POA: Diagnosis not present

## 2014-11-18 DIAGNOSIS — F341 Dysthymic disorder: Secondary | ICD-10-CM | POA: Diagnosis not present

## 2014-11-18 DIAGNOSIS — M353 Polymyalgia rheumatica: Secondary | ICD-10-CM | POA: Diagnosis not present

## 2014-11-18 DIAGNOSIS — G629 Polyneuropathy, unspecified: Secondary | ICD-10-CM | POA: Diagnosis not present

## 2014-11-18 DIAGNOSIS — J189 Pneumonia, unspecified organism: Secondary | ICD-10-CM | POA: Diagnosis not present

## 2014-11-22 ENCOUNTER — Telehealth: Payer: Self-pay | Admitting: Internal Medicine

## 2014-11-22 DIAGNOSIS — F341 Dysthymic disorder: Secondary | ICD-10-CM | POA: Diagnosis not present

## 2014-11-22 DIAGNOSIS — R5381 Other malaise: Secondary | ICD-10-CM | POA: Diagnosis not present

## 2014-11-22 DIAGNOSIS — G629 Polyneuropathy, unspecified: Secondary | ICD-10-CM | POA: Diagnosis not present

## 2014-11-22 DIAGNOSIS — M353 Polymyalgia rheumatica: Secondary | ICD-10-CM | POA: Diagnosis not present

## 2014-11-22 DIAGNOSIS — E1165 Type 2 diabetes mellitus with hyperglycemia: Secondary | ICD-10-CM | POA: Diagnosis not present

## 2014-11-22 DIAGNOSIS — J189 Pneumonia, unspecified organism: Secondary | ICD-10-CM | POA: Diagnosis not present

## 2014-11-22 NOTE — Telephone Encounter (Signed)
Ok to let pt know that she does not have sleep apnea, but she needs to followup with TP to review.

## 2014-11-22 NOTE — Telephone Encounter (Signed)
Pt would like results of her sleep study. She is aware that TP is out of the office this week. On the sleep study document it has KC's name on it.  Vonore - could you interpret the results?

## 2014-11-22 NOTE — Telephone Encounter (Signed)
Attempted to call pt. No answer, no option to leave a message. Will try back. 

## 2014-11-23 ENCOUNTER — Encounter: Payer: Self-pay | Admitting: Adult Health

## 2014-11-23 NOTE — Telephone Encounter (Signed)
Patient scheduled for OV with TP 12/01/14 at 2pm Pt aware of results per [facility].  Nothing further needed.

## 2014-11-30 ENCOUNTER — Telehealth: Payer: Self-pay | Admitting: Internal Medicine

## 2014-11-30 DIAGNOSIS — R5381 Other malaise: Secondary | ICD-10-CM | POA: Diagnosis not present

## 2014-11-30 DIAGNOSIS — F341 Dysthymic disorder: Secondary | ICD-10-CM | POA: Diagnosis not present

## 2014-11-30 DIAGNOSIS — M353 Polymyalgia rheumatica: Secondary | ICD-10-CM | POA: Diagnosis not present

## 2014-11-30 DIAGNOSIS — J189 Pneumonia, unspecified organism: Secondary | ICD-10-CM | POA: Diagnosis not present

## 2014-11-30 DIAGNOSIS — E1165 Type 2 diabetes mellitus with hyperglycemia: Secondary | ICD-10-CM | POA: Diagnosis not present

## 2014-11-30 DIAGNOSIS — G629 Polyneuropathy, unspecified: Secondary | ICD-10-CM | POA: Diagnosis not present

## 2014-11-30 NOTE — Telephone Encounter (Signed)
Spoke with Olivia Mackie, nurse at [facility]- wants to verify that pt has appt soon.  I verified appt tomorrow with TP at 2:00.  Nothing further needed at this time.

## 2014-12-01 ENCOUNTER — Other Ambulatory Visit: Payer: Self-pay | Admitting: Adult Health

## 2014-12-01 ENCOUNTER — Encounter: Payer: Self-pay | Admitting: Adult Health

## 2014-12-01 ENCOUNTER — Ambulatory Visit (INDEPENDENT_AMBULATORY_CARE_PROVIDER_SITE_OTHER): Payer: Medicare Other | Admitting: Adult Health

## 2014-12-01 VITALS — BP 120/90 | HR 74 | Temp 98.6°F | Ht 70.0 in | Wt 295.0 lb

## 2014-12-01 DIAGNOSIS — J449 Chronic obstructive pulmonary disease, unspecified: Secondary | ICD-10-CM

## 2014-12-01 DIAGNOSIS — J9611 Chronic respiratory failure with hypoxia: Secondary | ICD-10-CM

## 2014-12-01 DIAGNOSIS — R059 Cough, unspecified: Secondary | ICD-10-CM

## 2014-12-01 DIAGNOSIS — R05 Cough: Secondary | ICD-10-CM

## 2014-12-01 DIAGNOSIS — R0600 Dyspnea, unspecified: Secondary | ICD-10-CM | POA: Diagnosis not present

## 2014-12-01 MED ORDER — VALSARTAN 160 MG PO TABS: 160.0000 mg | ORAL_TABLET | Freq: Every day | ORAL | Status: DC

## 2014-12-01 NOTE — Addendum Note (Signed)
Addended by: Parke Poisson E on: 12/01/2014 03:12 PM   Modules accepted: Orders, Medications

## 2014-12-01 NOTE — Assessment & Plan Note (Signed)
Dyspnea supsect is multifactoral with deconditiong, obesity , D/CHF  PFT w/ out sign obsruction or restriciton  Recent CT chest angio with no PE  PNA cleared on follow up cxr .  Consider HRCT in 3 month if still symptomatic w/ low diffusing if concerned for ILD .    Plan   top Lisinopril .  Begin Diovan 160mg  daily  Decrease O2 to 2 l/m At bedtime   May continue to use Duoneb As needed   Follow up Dr. Chase Caller in 6-8 weeks and As needed

## 2014-12-01 NOTE — Assessment & Plan Note (Signed)
Daily cough on ACE  Will stop ACE   Plan  Stop Lisinopril .  Begin Diovan 160mg  daily  Follow up Dr. Chase Caller in 6-8 weeks and As needed   Follow up with PCP for b/p control  Would avoid ace inhitibitor in future if possible

## 2014-12-01 NOTE — Patient Instructions (Addendum)
Stop Lisinopril .  Begin Diovan 160mg  daily  Decrease O2 to 2 l/m At bedtime   May continue to use Duoneb As needed   Follow up Dr. Chase Caller in 6-8 weeks and As needed

## 2014-12-01 NOTE — Assessment & Plan Note (Signed)
Hypoxic RF w/ noct desats , no OSA on sleep apnea  Plan   Decrease O2 to 2 l/m At bedtime   Follow up Dr. Chase Caller in 6-8 weeks and As needed

## 2014-12-01 NOTE — Progress Notes (Signed)
Subjective:    Patient ID: Mary Acosta, female    DOB: 1955/09/21, 59 y.o.   MRN: [id number]  PCP Thressa Sheller, MD   HPI  IOV 09/26/2014  Chief Complaint  Patient presents with  . Pulmonary Consult    Pt referred by Dr. Sherald Barge for pneumonia.     59 year-old morbidly obese female with multiple medical problems including obesity, chronic pain, diabetes says has a long history of COPD not otherwise specified and quit smoking 4 years ago [close to 100 pack smoking history]. At baseline prior to December 2015 she says she was quite functional without much of dyspnea for activities of daily living. Then in mid November 2015 had an admission for hyperglycemia. This was followed by an admission 08/26/2014 08/29/2014 for hospital-acquired pneumonia based on a CT scan of the chest 08/26/2014 that showed right lower lobe infiltrate but ruled out pulmonary embolism. Readmitted 09/08/2014 through gender 17 2016 for COPD exacerbation. Since these admissions she says she is no longer as functional. ECOG functional status is now f4. She spends most of the day sitting or in bed. Only movement is to the bathroom and back to the bed and maybe to the dining room. These activities make extremely dyspneic that she still barely able to do her functional activities such as changing clothes or taking a shower. These activities make extremely dyspneic. Since discharge from the hospital she's not using her Spiriva because of chest tightness from Spiriva. In fact her husband says that she's not using any of her inhalers. She reports class III-for dyspnea associated with mild cough that is dry.  She's not had any PFTs in a long time according to her history  Chest x-ray 09/08/2014 lung fields are clear  She needed a wheel chair to get into ouroffice due to dyspnea  She is not on o2 at home.  Walking desaturation test 185 feet x 3 laps on RA: did only 3/4 of 1 lap. STOPPED 2 tims. Pulse ox stated at  100%. HR rose to 107/min  11/01/14  Follow up PFT /COPD? Patient returns for a follow-up visit and review PFT Patient was seen one month ago for pulmonary consultation for possible COPD. She is a former smoker had a recent hospital. They should for hospital-acquired pneumonia , and COPD exacerbation. Patient had persistent shortness of breath. He had no desaturations with walking in office. PFTs today showed an FEV1 of 82%, ratio 81, FVC at 79%, no significant bronchodilator response, decreased diffusing capacity is 57%. Patient is on an ACE inhibitor, chronic narcotics, and is mildly obese with weight at 291. Patient had a CT chest in January 2016 was negative for pulmonary embolism, showed a right lower lobe pneumonia, follow-up chest x-ray was negative 2-D echo done in January showed severe LVH, EF 55% , GR 1 DD  Tried spiriva in past, intolerant.  Started on Duoneb feels it helps her breathing.  Says she was rechecked for sleep apnea in past  and test was neg.  Previous lap band with loss of 60lbs (high wt was 346) . Wt starting to tr up.  Has chronic pain .-back, knee  and FM  Started on PT last ov. Hard on her knees.  She denies any chest pain, orthopnea, PND, or increased leg swelling >>rec ACE d/c per PCP , ONO >+   12/01/2014 Follow up Dyspnea /Chronic Hypoxic Resp failure -nocturnal desats/Sleep study  Pt returns for 1 month follow up  Seen last ov with ongoing  dyspnea.  PFT showed no sign COPD (preserved FEV1, no obstruction , very little restriction)  CT chest showed no PE , +PNA, follow up cxr showed clearance.  She was recommended to stop ACE inhibitor. Per PCP guidance.  Continues to have cough daily.  She was felt to have multifactoral dyspnea with deconditioning. , obesity, D-CHF .  ONO showed nocturnal desats she was started on O2  Pt was sent for sleep study which was neg for OSA.  We reviewed these results.  She did have desats that resolved with O2 .  She is starting  to feel better. Feels the o2 is helping.  Feels better during daytimes.  She denies any chest pain, orthopnea, PND, or increased leg swelling.    Review of Systems   Constitutional:   No  weight loss, night sweats,  Fevers, chills, + fatigue, or  lassitude.  HEENT:   No headaches,  Difficulty swallowing,  Tooth/dental problems, or  Sore throat,                No sneezing, itching, ear ache,  +nasal congestion, post nasal drip,   CV:  No chest pain,  Orthopnea, PND, swelling in lower extremities, anasarca, dizziness, palpitations, syncope.   GI  No heartburn, indigestion, abdominal pain, nausea, vomiting, diarrhea, change in bowel habits, loss of appetite, bloody stools.   Resp: .  No chest wall deformity  Skin: no rash or lesions.  GU: no dysuria, change in color of urine, no urgency or frequency.  No flank pain, no hematuria   MS:  No joint pain or swelling.  No decreased range of motion.  No back pain.  Psych:  No change in mood or affect. No depression or anxiety.  No memory loss.         Objective:   Physical Exam  GEN: A/Ox3; pleasant , NAD, obese   HEENT:  [place]/AT,  EACs-clear, TMs-wnl, NOSE-clear, THROAT-clear, no lesions, no postnasal drip or exudate noted.   NECK:  Supple w/ fair ROM; no JVD; normal carotid impulses w/o bruits; no thyromegaly or nodules palpated; no lymphadenopathy.  RESP  Decreased BS in bases no accessory muscle use, no dullness to percussion  CARD:  RRR, no m/r/g  , tr  peripheral edema, pulses intact, no cyanosis or clubbing.  GI:   Soft & nt; nml bowel sounds; no organomegaly or masses detected.  Musco: Warm bil, no deformities or joint swelling noted.   Neuro: alert, no focal deficits noted.    Skin: Warm, no lesions or rashes

## 2014-12-02 ENCOUNTER — Telehealth: Payer: Self-pay | Admitting: Adult Health

## 2014-12-02 DIAGNOSIS — J449 Chronic obstructive pulmonary disease, unspecified: Secondary | ICD-10-CM

## 2014-12-02 NOTE — Telephone Encounter (Signed)
4.6.16 ONO results received on 3lpm Pt just seen on 4.7.16 and was advised to decrease O2 at bedtime from 3lpm to 2lpm based on split night study However, TP recommends to now hold at 3lpm at bedtime and with naps  Called spoke with patient to discuss Pt voiced her understanding and agrees with TP's recs to hold at 3lpm as she does feel that she sleeps better and feels more rested during the day Order to correct the previous order sent to [facility] Care Coordination Note updated  Nothing further needed; will sign off.

## 2014-12-19 ENCOUNTER — Ambulatory Visit: Payer: Medicare Other | Admitting: Internal Medicine

## 2015-01-04 ENCOUNTER — Encounter: Payer: Self-pay | Admitting: Adult Health

## 2015-01-15 ENCOUNTER — Encounter (HOSPITAL_BASED_OUTPATIENT_CLINIC_OR_DEPARTMENT_OTHER): Payer: Medicare Other

## 2015-01-18 ENCOUNTER — Encounter: Payer: Self-pay | Admitting: Internal Medicine

## 2015-01-18 ENCOUNTER — Ambulatory Visit (INDEPENDENT_AMBULATORY_CARE_PROVIDER_SITE_OTHER): Payer: Medicare Other | Admitting: Internal Medicine

## 2015-01-18 VITALS — BP 128/88 | HR 82 | Ht 70.0 in | Wt 296.0 lb

## 2015-01-18 DIAGNOSIS — J449 Chronic obstructive pulmonary disease, unspecified: Secondary | ICD-10-CM

## 2015-01-18 NOTE — Patient Instructions (Addendum)
ICD-9-CM ICD-10-CM   1. Chronic obstructive pulmonary disease, unspecified COPD, unspecified chronic bronchitis type 496 J44.9    Glad you are stable and as well as you can be Glad o2 is helping - use it and nebs as before Talk to [facility], MD about wheelchair need Flu shot in fall 2016   followup 9 months with NP Tammy or sooner if needed

## 2015-01-18 NOTE — Progress Notes (Signed)
Subjective:    Patient ID: Mary Acosta, female    DOB: [birth year], 59 y.o.   MRN: [id number]  HPI PCP Thressa Sheller, MD   HPI  IOV 09/26/2014  Chief Complaint  Patient presents with  . Pulmonary Consult    Pt referred by Dr. Sherald Barge for pneumonia.     59 year-old morbidly obese female with multiple medical problems including obesity, chronic pain, diabetes says has a long history of COPD not otherwise specified and quit smoking 4 years ago [close to 100 pack smoking history]. At baseline prior to December 2015 she says she was quite functional without much of dyspnea for activities of daily living. Then in mid November 2015 had an admission for hyperglycemia. This was followed by an admission 08/26/2014 08/29/2014 for hospital-acquired pneumonia based on a CT scan of the chest 08/26/2014 that showed right lower lobe infiltrate but ruled out pulmonary embolism. Readmitted 09/08/2014 through gender 17 2016 for COPD exacerbation. Since these admissions she says she is no longer as functional. ECOG functional status is now f4. She spends most of the day sitting or in bed. Only movement is to the bathroom and back to the bed and maybe to the dining room. These activities make extremely dyspneic that she still barely able to do her functional activities such as changing clothes or taking a shower. These activities make extremely dyspneic. Since discharge from the hospital she's not using her Spiriva because of chest tightness from Spiriva. In fact her husband says that she's not using any of her inhalers. She reports class III-for dyspnea associated with mild cough that is dry.  She's not had any PFTs in a long time according to her history  Chest x-ray 09/08/2014 lung fields are clear  She needed a wheel chair to get into ouroffice due to dyspnea  She is not on o2 at home.  Walking desaturation test 185 feet x 3 laps on RA: did only 3/4 of 1 lap. STOPPED 2 tims. Pulse ox stated at  100%. HR rose to 107/min  11/01/2014 Follow up PFT /COPD? Patient returns for a follow-up visit and review PFT Patient was seen one month ago for pulmonary consultation for possible COPD. She is a former smoker had a recent hospital. They should for hospital-acquired pneumonia , and COPD exacerbation. Patient had persistent shortness of breath. He had no desaturations with walking in office. PFTs today showed an FEV1 of 82%, ratio 81, FVC at 79%, no significant bronchodilator response, decreased diffusing capacity is 57%. Patient is on an ACE inhibitor, chronic narcotics, and is mildly obese with weight at 291. Patient had a CT chest in January 2016 was negative for pulmonary embolism, showed a right lower lobe pneumonia, follow-up chest x-ray was negative 2-D echo done in January showed severe LVH, EF 55% , GR 1 DD  Tried spiriva in past, intolerant.  Started on Duoneb feels it helps her breathing.  Says she was rechecked for sleep apnea in past  and test was neg.  Previous lap band with loss of 60lbs (high wt was 346) . Wt starting to tr up.  Has chronic pain .-back, knee  and FM  Started on PT last ov. Hard on her knees.  She denies any chest pain, orthopnea, PND, or increased leg swelling  OV  01/18/2015  Chief Complaint  Patient presents with  . Follow-up    Pt stated she is feeling better since sleeping with O2. Pt c/o DOE, weakness--pt requesting a wheelchair. Pt  denies CP/tightness and cough.     Follow-up mild COPD with chronic respiratory failure in the setting of obesity, chronic pain and poor functional status  Presents with her husband. She says that oxygen therapy has helped. She still got poor functional status due to obesity and pain. She wants a wheelchair. She is compliant with her oxygen and nebulizers. There is no new issues.  Allergies  Allergen Reactions  . Gabapentin Swelling    Swelling in legs  . Losartan Other (See Comments)    Myalgias and muscle cramping    . Oxycodone Itching  . Sulfa Antibiotics Nausea Only and Rash  . Sulfonamide Derivatives Nausea Only      Current outpatient prescriptions:  .  acetaminophen (TYLENOL) 500 MG tablet, Take 500 mg by mouth every 4 (four) hours as needed for moderate pain., Disp: , Rfl:  .  albuterol (PROVENTIL HFA;VENTOLIN HFA) 108 (90 BASE) MCG/ACT inhaler, Inhale 2 puffs into the lungs every 6 (six) hours as needed for wheezing or shortness of breath., Disp: 1 Inhaler, Rfl: 2 .  albuterol-ipratropium (COMBIVENT) 18-103 MCG/ACT inhaler, Inhale 1-2 puffs into the lungs every 4 (four) hours as needed for wheezing or shortness of breath., Disp: , Rfl:  .  amitriptyline (ELAVIL) 25 MG tablet, Take 100 mg by mouth at bedtime., Disp: , Rfl:  .  amLODipine (NORVASC) 5 MG tablet, Take 1 tablet (5 mg total) by mouth daily., Disp: 30 tablet, Rfl: 2 .  cetirizine (ZYRTEC) 10 MG tablet, Take 10 mg by mouth daily as needed for allergies. , Disp: , Rfl:  .  clonazePAM (KLONOPIN) 0.5 MG tablet, Take 0.5 mg by mouth daily as needed for anxiety., Disp: , Rfl:  .  insulin glargine (LANTUS) 100 UNIT/ML injection, Inject 0.8 mLs (80 Units total) into the skin daily., Disp: 10 mL, Rfl: 11 .  insulin lispro (HUMALOG) 100 UNIT/ML injection, Inject 0.1 mLs (10 Units total) into the skin 3 (three) times daily before meals., Disp: 10 mL, Rfl: 11 .  ipratropium-albuterol (DUONEB) 0.5-2.5 (3) MG/3ML SOLN, Take 3 mLs by nebulization 4 (four) times daily., Disp: 360 mL, Rfl: 11 .  levothyroxine (SYNTHROID, LEVOTHROID) 150 MCG tablet, Take 150 mcg by mouth daily before breakfast., Disp: , Rfl:  .  morphine (MS CONTIN) 60 MG 12 hr tablet, Take 60 mg by mouth every 12 (twelve) hours., Disp: , Rfl:  .  polyethylene glycol (MIRALAX / GLYCOLAX) packet, Take 17 g by mouth 2 (two) times daily as needed for moderate constipation., Disp: 14 each, Rfl: 0 .  potassium chloride SA (K-DUR,KLOR-CON) 20 MEQ tablet, Take 20 mEq by mouth as needed (for  cramping). , Disp: , Rfl: 1 .  Spacer/Aero-Holding Chambers (AEROCHAMBER PLUS WITH MASK) inhaler, Use as instructed, Disp: 1 each, Rfl: 2 .  valsartan (DIOVAN) 160 MG tablet, Take 1 tablet (160 mg total) by mouth daily., Disp: 30 tablet, Rfl: 1    Review of Systems  Constitutional: Negative for fever and unexpected weight change.  HENT: Negative for congestion, dental problem, ear pain, nosebleeds, postnasal drip, rhinorrhea, sinus pressure, sneezing, sore throat and trouble swallowing.   Eyes: Negative for redness and itching.  Respiratory: Positive for cough and shortness of breath. Negative for chest tightness and wheezing.   Cardiovascular: Negative for palpitations and leg swelling.  Gastrointestinal: Negative for nausea and vomiting.  Genitourinary: Negative for dysuria.  Musculoskeletal: Negative for joint swelling.  Skin: Negative for rash.  Neurological: Positive for weakness. Negative for headaches.  Hematological: Does  not bruise/bleed easily.  Psychiatric/Behavioral: Negative for dysphoric mood. The patient is not nervous/anxious.        Objective:   Physical Exam  Constitutional: She is oriented to person, place, and time. She appears well-developed and well-nourished. No distress.  Obese and deconditioned looking  HENT:  Head: Normocephalic and atraumatic.  Right Ear: External ear normal.  Left Ear: External ear normal.  Mouth/Throat: Oropharynx is clear and moist. No oropharyngeal exudate.  Mallampati class III  Eyes: Conjunctivae and EOM are normal. Pupils are equal, round, and reactive to light. Right eye exhibits no discharge. Left eye exhibits no discharge. No scleral icterus.  Neck: Normal range of motion. Neck supple. No JVD present. No tracheal deviation present. No thyromegaly present.  Cardiovascular: Normal rate, regular rhythm, normal heart sounds and intact distal pulses.  Exam reveals no gallop and no friction rub.   No murmur heard. Pulmonary/Chest:  Effort normal and breath sounds normal. No respiratory distress. She has no wheezes. She has no rales. She exhibits no tenderness.  Abdominal: Soft. Bowel sounds are normal. She exhibits no distension and no mass. There is no tenderness. There is no rebound and no guarding.  Musculoskeletal: She exhibits no edema or tenderness.  Walks with an antalgic gait  Lymphadenopathy:    She has no cervical adenopathy.  Neurological: She is alert and oriented to person, place, and time. She has normal reflexes. No cranial nerve deficit. She exhibits normal muscle tone. Coordination normal.  Skin: Skin is warm and dry. No rash noted. She is not diaphoretic. No erythema. No pallor.  Psychiatric:  Flat affect  Vitals reviewed.   Filed Vitals:   01/18/15 1109  BP: 128/88  Pulse: 82  Height: 5\' 10"  (1.778 m)  Weight: 296 lb (134.265 kg)  SpO2: 95%         Assessment & Plan:     ICD-9-CM ICD-10-CM   1. Chronic obstructive pulmonary disease, unspecified COPD, unspecified chronic bronchitis type 496 J44.9     Glad you are stable and as well as you can be Glad o2 is helping - use it and nebs as before Talk to [facility], MD about wheelchair need Flu shot in fall 2016   followup 9 months with NP Tammy or sooner if needed   Dr. Brand Males, M.D., [facility].C.P Pulmonary and Critical Care Medicine Staff Physician [place] [facility] Pager: [phone], If no answer or between  15:00h - 7:00h: call [phone]  01/18/2015 11:31 AM

## 2015-02-20 ENCOUNTER — Other Ambulatory Visit: Payer: Self-pay

## 2015-03-29 ENCOUNTER — Other Ambulatory Visit: Payer: Self-pay

## 2015-03-29 DIAGNOSIS — Z9884 Bariatric surgery status: Secondary | ICD-10-CM

## 2015-04-03 ENCOUNTER — Other Ambulatory Visit: Payer: Self-pay

## 2015-04-03 DIAGNOSIS — Z1231 Encounter for screening mammogram for malignant neoplasm of breast: Secondary | ICD-10-CM

## 2015-04-04 ENCOUNTER — Other Ambulatory Visit: Payer: Medicare Other

## 2015-04-05 ENCOUNTER — Ambulatory Visit
Admission: RE | Admit: 2015-04-05 | Discharge: 2015-04-05 | Disposition: A | Discharge status: Home / Self Care | Payer: Medicare Other | Source: Ambulatory Visit | Attending: Surgery | Admitting: Surgery

## 2015-04-05 ENCOUNTER — Ambulatory Visit
Admission: RE | Admit: 2015-04-05 | Discharge: 2015-04-05 | Disposition: A | Discharge status: Home / Self Care | Payer: Medicare Other | Source: Ambulatory Visit

## 2015-04-05 DIAGNOSIS — Z1231 Encounter for screening mammogram for malignant neoplasm of breast: Secondary | ICD-10-CM | POA: Diagnosis not present

## 2015-04-05 DIAGNOSIS — Z9884 Bariatric surgery status: Secondary | ICD-10-CM | POA: Diagnosis not present

## 2015-04-05 DIAGNOSIS — K228 Other specified diseases of esophagus: Secondary | ICD-10-CM | POA: Diagnosis not present

## 2015-04-08 ENCOUNTER — Encounter (HOSPITAL_COMMUNITY): Payer: Self-pay

## 2015-04-08 ENCOUNTER — Emergency Department (HOSPITAL_COMMUNITY)
Admission: EM | Admit: 2015-04-08 | Discharge: 2015-04-08 | Disposition: A | Discharge status: Home / Self Care | Payer: Medicare Other | Attending: Emergency Medicine | Admitting: Emergency Medicine

## 2015-04-08 DIAGNOSIS — Z8719 Personal history of other diseases of the digestive system: Secondary | ICD-10-CM | POA: Insufficient documentation

## 2015-04-08 DIAGNOSIS — Z8541 Personal history of malignant neoplasm of cervix uteri: Secondary | ICD-10-CM | POA: Insufficient documentation

## 2015-04-08 DIAGNOSIS — M179 Osteoarthritis of knee, unspecified: Secondary | ICD-10-CM | POA: Diagnosis not present

## 2015-04-08 DIAGNOSIS — G894 Chronic pain syndrome: Secondary | ICD-10-CM | POA: Insufficient documentation

## 2015-04-08 DIAGNOSIS — I10 Essential (primary) hypertension: Secondary | ICD-10-CM | POA: Diagnosis not present

## 2015-04-08 DIAGNOSIS — Z87891 Personal history of nicotine dependence: Secondary | ICD-10-CM | POA: Diagnosis not present

## 2015-04-08 DIAGNOSIS — J449 Chronic obstructive pulmonary disease, unspecified: Secondary | ICD-10-CM | POA: Insufficient documentation

## 2015-04-08 DIAGNOSIS — G4700 Insomnia, unspecified: Secondary | ICD-10-CM | POA: Diagnosis not present

## 2015-04-08 DIAGNOSIS — Z8701 Personal history of pneumonia (recurrent): Secondary | ICD-10-CM | POA: Diagnosis not present

## 2015-04-08 DIAGNOSIS — F410 Panic disorder [episodic paroxysmal anxiety] without agoraphobia: Secondary | ICD-10-CM | POA: Diagnosis not present

## 2015-04-08 DIAGNOSIS — E1165 Type 2 diabetes mellitus with hyperglycemia: Secondary | ICD-10-CM | POA: Insufficient documentation

## 2015-04-08 DIAGNOSIS — Z794 Long term (current) use of insulin: Secondary | ICD-10-CM | POA: Diagnosis not present

## 2015-04-08 DIAGNOSIS — R110 Nausea: Secondary | ICD-10-CM | POA: Diagnosis not present

## 2015-04-08 DIAGNOSIS — R7309 Other abnormal glucose: Secondary | ICD-10-CM | POA: Diagnosis not present

## 2015-04-08 DIAGNOSIS — R739 Hyperglycemia, unspecified: Secondary | ICD-10-CM | POA: Diagnosis not present

## 2015-04-08 DIAGNOSIS — E039 Hypothyroidism, unspecified: Secondary | ICD-10-CM | POA: Diagnosis not present

## 2015-04-08 DIAGNOSIS — R531 Weakness: Secondary | ICD-10-CM | POA: Diagnosis not present

## 2015-04-08 DIAGNOSIS — F329 Major depressive disorder, single episode, unspecified: Secondary | ICD-10-CM | POA: Insufficient documentation

## 2015-04-08 DIAGNOSIS — Z8673 Personal history of transient ischemic attack (TIA), and cerebral infarction without residual deficits: Secondary | ICD-10-CM | POA: Insufficient documentation

## 2015-04-08 DIAGNOSIS — Z79899 Other long term (current) drug therapy: Secondary | ICD-10-CM | POA: Insufficient documentation

## 2015-04-08 DIAGNOSIS — Z9889 Other specified postprocedural states: Secondary | ICD-10-CM | POA: Insufficient documentation

## 2015-04-08 DIAGNOSIS — Z85850 Personal history of malignant neoplasm of thyroid: Secondary | ICD-10-CM | POA: Insufficient documentation

## 2015-04-08 LAB — COMPREHENSIVE METABOLIC PANEL
ALBUMIN: 3.5 g/dL (ref 3.5–5.0)
ALK PHOS: 107 U/L (ref 38–126)
ALT: 48 U/L (ref 14–54)
AST: 57 U/L — ABNORMAL HIGH (ref 15–41)
Anion gap: 10 (ref 5–15)
BUN: 7 mg/dL (ref 6–20)
CO2: 27 mmol/L (ref 22–32)
Calcium: 8.9 mg/dL (ref 8.9–10.3)
Chloride: 97 mmol/L — ABNORMAL LOW (ref 101–111)
Creatinine, Ser: 0.82 mg/dL (ref 0.44–1.00)
GFR calc non Af Amer: >60 mL/min (ref >60)
Glucose, Bld: 277 mg/dL — ABNORMAL HIGH (ref 65–99)
POTASSIUM: 4.5 mmol/L (ref 3.5–5.1)
Sodium: 134 mmol/L — ABNORMAL LOW (ref 135–145)
Total Bilirubin: 0.6 mg/dL (ref 0.3–1.2)
Total Protein: 7.1 g/dL (ref 6.5–8.1)

## 2015-04-08 LAB — CBC WITH DIFFERENTIAL/PLATELET
BASOS ABS: 0.1 10*3/uL (ref 0.0–0.1)
Basophils Relative: 1 % (ref 0–1)
EOS PCT: 3 % (ref 0–5)
Eosinophils Absolute: 0.2 10*3/uL (ref 0.0–0.7)
HEMATOCRIT: 40.3 % (ref 36.0–46.0)
HEMOGLOBIN: 12.7 g/dL (ref 12.0–15.0)
LYMPHS PCT: 20 % (ref 12–46)
Lymphs Abs: 1.4 10*3/uL (ref 0.7–4.0)
MCH: 25.3 pg — AB (ref 26.0–34.0)
MCHC: 31.5 g/dL (ref 30.0–36.0)
MCV: 80.3 fL (ref 78.0–100.0)
MONO ABS: 0.4 10*3/uL (ref 0.1–1.0)
Monocytes Relative: 6 % (ref 3–12)
Neutro Abs: 4.7 10*3/uL (ref 1.7–7.7)
Neutrophils Relative %: 70 % (ref 43–77)
Platelets: 139 10*3/uL — ABNORMAL LOW (ref 150–400)
RBC: 5.02 MIL/uL (ref 3.87–5.11)
RDW: 14.6 % (ref 11.5–15.5)
WBC: 6.7 10*3/uL (ref 4.0–10.5)

## 2015-04-08 LAB — I-STAT CG4 LACTIC ACID, ED: Lactic Acid, Venous: 2.31 mmol/L (ref 0.5–2.0)

## 2015-04-08 LAB — URINE MICROSCOPIC-ADD ON

## 2015-04-08 LAB — URINALYSIS, ROUTINE W REFLEX MICROSCOPIC
Glucose, UA: >1000 mg/dL — AB
Ketones, ur: NEGATIVE mg/dL
LEUKOCYTES UA: NEGATIVE
Nitrite: NEGATIVE
Protein, ur: 100 mg/dL — AB
Specific Gravity, Urine: 1.033 — ABNORMAL HIGH (ref 1.005–1.030)
UROBILINOGEN UA: 1 mg/dL (ref 0.0–1.0)
pH: 5.5 (ref 5.0–8.0)

## 2015-04-08 LAB — I-STAT VENOUS BLOOD GAS, ED
Acid-Base Excess: 4 mmol/L — ABNORMAL HIGH (ref 0.0–2.0)
Bicarbonate: 29.3 mEq/L — ABNORMAL HIGH (ref 20.0–24.0)
O2 SAT: 45 %
PH VEN: 7.412 — AB (ref 7.250–7.300)
PO2 VEN: 25 mmHg — AB (ref 30.0–45.0)
TCO2: 31 mmol/L (ref 0–100)
pCO2, Ven: 45.9 mmHg (ref 45.0–50.0)

## 2015-04-08 LAB — LIPASE, BLOOD: Lipase: 14 U/L — ABNORMAL LOW (ref 22–51)

## 2015-04-08 MED ORDER — SODIUM CHLORIDE 0.9 % IV BOLUS (SEPSIS)
1000.0000 mL | Freq: Once | INTRAVENOUS | Status: AC
  Administered 2015-04-08: 1000 mL via INTRAVENOUS

## 2015-04-08 NOTE — ED Provider Notes (Signed)
CSN: [id number]     Arrival date & time 04/08/15  1617 History   First MD Initiated Contact with Patient 04/08/15 1619     Chief Complaint  Patient presents with  . Hyperglycemia     (Consider location/radiation/quality/duration/timing/severity/associated sxs/prior Treatment) HPI  Patient presents with concern of"feeling like crap"with polyuria, polydipsia over the past 2 days. Onset was gradual, but symptoms have been progressive, with generalized discomfort, crampiness as well. Patient has been trying to take insulin as directed, but states that her blood glucose is critically high on each recent reading. She acknowledges nausea, denies vomiting, acknowledges diarrhea, states that this is chronic. She denies confusion, disorientation, syncope, lightheadedness. No recent medication changes, diet changes, activity changes.   Past Medical History  Diagnosis Date  . Anxiety     with panic attacks  . Insomnia   . COPD (chronic obstructive pulmonary disease)   . Polymyalgia rheumatica   . Fatty liver disease, nonalcoholic   . Scoliosis   . Asthma   . Depression   . History of TIA (transient ischemic attack) 11-01-2010    NO RESIDUAL  . Hyperlipidemia   . Lumbar stenosis   . Chronic pain syndrome     [facility]  . Seasonal allergies   . Chronic narcotic use   . Frequency of urination   . Urgency of urination   . Nocturia   . Vaginal pain S/P SLING  FEB 2012  . Hypertension   . OSA (obstructive sleep apnea)     NO CPAP SINCE WT LOSS  . Hypothyroidism   . IDDM (insulin dependent diabetes mellitus)   . Daily headache   . Osteoarthritis     with severe disease in knee  . DJD (degenerative joint disease)   . Arthritis     "back; feet; hands; shoulders" (08/26/2014)  . Fibromyalgia   . Chronic lower back pain   . Thyroid cancer   . Cervical cancer   . Pneumonia "several times"  . HCAP (healthcare-associated pneumonia) 08/26/2014   Past Surgical History   Procedure Laterality Date  . Appendectomy  1982  . Tubal ligation  1983  . Hysteroscopy w/d&c  08-19-2007    PMB  . Laparoscopic gastric banding  03/01/2006    TRUNCAL VAGOTOMY/ PLACEMENT OF VG BAND  . Knee arthroscopy w/ debridement Left 03/29/2006    INTERNAL DERANGEMENT/ SEVERE DJD/ MENISCUS TEARS  . Transvaginal subureteral tape/ sling  09-28-2010    MIXED URINARY INCONTINENCE  . Revision total knee arthroplasty Left 08-29-2008; 05/2009  . Total knee arthroplasty Left 01-23-2007    SEVERE DJD  . Total thyroidectomy  11-22-2005    BILATERAL THYROID NODULES-- PAPILLARY CARCINOMA (0.5CM)/ ADENOMATOID NODULES  . Breast lumpectomy Left 02-28-2005    ATYPICAL DUCTAL HYPERPLASIA  . Cardiac catheterization  09-04-2004    NORMAL CORONARY ANATOMY/ NORMAL LVF/ EF 60%  . Laparoscopic cholecystectomy  06-10-2002  . Tonsillectomy  1969  . Transthoracic echocardiogram  12-27-2010    LVSF NORMAL / EF 76-54%/ GRADE I DIASTOLIC DYSFUNCTION/ MILD MITRAL REGURG. / MILDLY DILATED LEFT ATRIUM/ MILDY INCREASED SYSTOLIC PRESSURE OF PULMONARY ARTERIES  . Cardiovascular stress test  12-27-2010  DR [place]    ABNORMAL NUCLEAR STUDY W/ /MILD INFERIOR ISCHEMIA/ EF 69%/  CT HEART ANGIOGRAM ;  NO ACUTE FINDINGS  . Cystoscopy  05/18/2012    Procedure: CYSTOSCOPY;  Surgeon: Reece Packer, MD;  Location: [facility];  Service: Urology;  Laterality: N/A;  examination under anethesia  .  Cryoablation  05/16/2003    w/LEEP FOR ABNORMAL PAP SMEAR   Family History  Problem Relation Age of Onset  . Colon cancer Maternal Uncle     x 2  . Breast cancer Other     great aunts x 5  . Diabetes Mother   . Heart disease Father   . Heart disease Mother    Social History  Substance Use Topics  . Smoking status: Former Smoker -- 2.50 packs/day for 39 years    Types: Cigarettes    Quit date: 10/22/2010  . Smokeless tobacco: Never Used  . Alcohol Use: No   OB History    No data available      Review of Systems  Constitutional:       Per HPI, otherwise negative  HENT:       Per HPI, otherwise negative  Respiratory:       Per HPI, otherwise negative  Cardiovascular:       Per HPI, otherwise negative  Gastrointestinal: Negative for vomiting.  Endocrine:       Negative aside from HPI  Genitourinary:       Neg aside from HPI   Musculoskeletal:       Per HPI, otherwise negative  Skin: Negative.   Neurological: Positive for weakness. Negative for syncope.      Allergies  Gabapentin; Losartan; Oxycodone; Sulfa antibiotics; and Sulfonamide derivatives  Home Medications   Prior to Admission medications   Medication Sig Start Date End Date Taking? Authorizing Provider  acetaminophen (TYLENOL) 500 MG tablet Take 500 mg by mouth every 4 (four) hours as needed for moderate pain.    Historical Provider, MD  albuterol (PROVENTIL HFA;VENTOLIN HFA) 108 (90 BASE) MCG/ACT inhaler Inhale 2 puffs into the lungs every 6 (six) hours as needed for wheezing or shortness of breath. 09/11/14   Melton Alar, PA-C  albuterol-ipratropium (COMBIVENT) 18-103 MCG/ACT inhaler Inhale 1-2 puffs into the lungs every 4 (four) hours as needed for wheezing or shortness of breath.    Historical Provider, MD  amitriptyline (ELAVIL) 25 MG tablet Take 100 mg by mouth at bedtime.    Historical Provider, MD  amLODipine (NORVASC) 5 MG tablet Take 1 tablet (5 mg total) by mouth daily. 08/29/14   Bobby Rumpf York, PA-C  cetirizine (ZYRTEC) 10 MG tablet Take 10 mg by mouth daily as needed for allergies.     Historical Provider, MD  clonazePAM (KLONOPIN) 0.5 MG tablet Take 0.5 mg by mouth daily as needed for anxiety.    Historical Provider, MD  insulin glargine (LANTUS) 100 UNIT/ML injection Inject 0.8 mLs (80 Units total) into the skin daily. 08/29/14   Bobby Rumpf York, PA-C  insulin lispro (HUMALOG) 100 UNIT/ML injection Inject 0.1 mLs (10 Units total) into the skin 3 (three) times daily before meals. 08/29/14    Bobby Rumpf York, PA-C  ipratropium-albuterol (DUONEB) 0.5-2.5 (3) MG/3ML SOLN Take 3 mLs by nebulization 4 (four) times daily. 10/20/14   Brand Males, MD  levothyroxine (SYNTHROID, LEVOTHROID) 150 MCG tablet Take 150 mcg by mouth daily before breakfast.    Historical Provider, MD  morphine (MS CONTIN) 60 MG 12 hr tablet Take 60 mg by mouth every 12 (twelve) hours.    Historical Provider, MD  polyethylene glycol (MIRALAX / GLYCOLAX) packet Take 17 g by mouth 2 (two) times daily as needed for moderate constipation. 08/29/14   Bobby Rumpf York, PA-C  potassium chloride SA (K-DUR,KLOR-CON) 20 MEQ tablet Take 20 mEq by mouth as  needed (for cramping).  05/30/14   Historical Provider, MD  Spacer/Aero-Holding Chambers (AEROCHAMBER PLUS WITH MASK) inhaler Use as instructed 09/08/14   Al Corpus, PA-C  valsartan (DIOVAN) 160 MG tablet Take 1 tablet (160 mg total) by mouth daily. 12/01/14 12/01/15  Tammy S Parrett, NP   SpO2 95% Physical Exam  Constitutional: She is oriented to person, place, and time. She appears well-developed and well-nourished.  Morbidly obese female sitting upright in bed, answering questions appropriately, moving all extremities spontaneously.  HENT:  Head: Normocephalic and atraumatic.  Eyes: Conjunctivae and EOM are normal.  Cardiovascular: Normal rate and regular rhythm.   Pulmonary/Chest: Effort normal and breath sounds normal. No stridor. No respiratory distress.  Abdominal: She exhibits no distension.  Musculoskeletal: She exhibits no edema.  Neurological: She is alert and oriented to person, place, and time. No cranial nerve deficit.  Skin: Skin is warm and dry.  Psychiatric: She has a normal mood and affect.  Nursing note and vitals reviewed.   ED Course  Procedures (including critical care time) Labs Review Labs Reviewed  COMPREHENSIVE METABOLIC PANEL - Abnormal; Notable for the following:    Sodium 134 (*)    Chloride 97 (*)    Glucose, Bld 277 (*)    AST 57  (*)    All other components within normal limits  LIPASE, BLOOD - Abnormal; Notable for the following:    Lipase 14 (*)    All other components within normal limits  CBC WITH DIFFERENTIAL/PLATELET - Abnormal; Notable for the following:    MCH 25.3 (*)    Platelets 139 (*)    All other components within normal limits  URINALYSIS, ROUTINE W REFLEX MICROSCOPIC (NOT AT [facility]) - Abnormal; Notable for the following:    Color, Urine AMBER (*)    APPearance CLOUDY (*)    Specific Gravity, Urine 1.033 (*)    Glucose, UA >1000 (*)    Hgb urine dipstick SMALL (*)    Bilirubin Urine SMALL (*)    Protein, ur 100 (*)    All other components within normal limits  URINE MICROSCOPIC-ADD ON - Abnormal; Notable for the following:    Squamous Epithelial / LPF FEW (*)    Casts GRANULAR CAST (*)    All other components within normal limits  I-STAT CG4 LACTIC ACID, ED - Abnormal; Notable for the following:    Lactic Acid, Venous 2.31 (*)    All other components within normal limits  I-STAT VENOUS BLOOD GAS, ED - Abnormal; Notable for the following:    pH, Ven 7.412 (*)    pO2, Ven 25.0 (*)    Bicarbonate 29.3 (*)    Acid-Base Excess 4.0 (*)    All other components within normal limits  CBG MONITORING, ED  CBG MONITORING, ED    Imaging Review No results found. I, Leinani Lisbon, personally reviewed and evaluated these images and lab results as part of my medical decision-making. 7:14 PM Patient in no distress with husband now present. The 3 discussed all findings at length, including mild hyperuricemia, but no evidence for DKA. Patient has improved here clinically. When a lengthy conversation about diabetes management, following with primary care, endocrinology.  MDM  Patient presents with generalized concerns, as well as specific turn for hyperglycemia.  Here the patient is awake and alert. Patient is hyperglycemic, though with no evidence for diabetic ketoacidosis. No evidence for focal  infection. Patient had minimal elevated lactic acidosis on arrival, and received fluid resuscitation empirically. With decreased  blood glucose, no new complaints, reassuring findings otherwise, she was discharged in stable condition to follow-up with her endocrinologist and her primary care physician.  Carmin Muskrat, MD 04/08/15 [phone]

## 2015-04-08 NOTE — Discharge Instructions (Signed)
As discussed, your evaluation today has been largely reassuring.  But, it is important that you monitor your condition carefully, and do not hesitate to return to the ED if you develop new, or concerning changes in your condition. ? ?Otherwise, please follow-up with your physician for appropriate ongoing care. ? ?

## 2015-04-08 NOTE — ED Notes (Signed)
Pt arrived via EMS from home c/o hyperglycemia the last couple days and generalized cramping. Pt states she took 90 units of insulin today, home monitor said blood sugar was greater then 600+.  Pt states she took a nap after the insulin woke up and still had severe generalized cramping.  EMS CBG 231 on scene. EMS gave around 100cc of NS.

## 2015-04-08 NOTE — ED Notes (Signed)
CBG 236. 

## 2015-04-08 NOTE — ED Notes (Signed)
Doctor at bedside.

## 2015-04-09 LAB — CBG MONITORING, ED
GLUCOSE-CAPILLARY: 259 mg/dL — AB (ref 65–99)
GLUCOSE-CAPILLARY: 236 mg/dL — AB (ref 65–99)

## 2015-04-27 DIAGNOSIS — G894 Chronic pain syndrome: Secondary | ICD-10-CM | POA: Diagnosis not present

## 2015-04-27 DIAGNOSIS — M533 Sacrococcygeal disorders, not elsewhere classified: Secondary | ICD-10-CM | POA: Diagnosis not present

## 2015-04-27 DIAGNOSIS — Z79891 Long term (current) use of opiate analgesic: Secondary | ICD-10-CM | POA: Diagnosis not present

## 2015-05-15 ENCOUNTER — Encounter: Payer: Medicare Other | Attending: Surgery | Admitting: Dietician

## 2015-05-15 ENCOUNTER — Encounter: Payer: Self-pay | Admitting: Dietician

## 2015-05-15 DIAGNOSIS — Z713 Dietary counseling and surveillance: Secondary | ICD-10-CM | POA: Diagnosis not present

## 2015-05-15 DIAGNOSIS — Z6841 Body Mass Index (BMI) 40.0 and over, adult: Secondary | ICD-10-CM | POA: Insufficient documentation

## 2015-05-15 NOTE — Patient Instructions (Addendum)
Try writing down blood sugar readings, how much insulin you are taking, and food you recently ate. Aim to eat 3 meals per day - 1/2 your plate vegetables, 1/4 plate protein, and 1/4 plate carbohydrates. Try having frozen [employer], Smart Ones, Healthy Choices for quick meals. Look for ones with vegetables. When you are feeling like your band is restricted (after fill), eat protein first and then vegetables. Stop drinking Pepsi and adding sugar to foods. Continue to keep tempting foods out of your home. Plan to walk 2-3 minutes (driveway) with your walker each day and increase when you can.  Consider talking to a counselor to help with emotional eating.

## 2015-05-15 NOTE — Progress Notes (Signed)
Medical Nutrition Therapy:  Appt start time: 1694 end time:  5038.   Assessment:  Primary concerns today: Mary Acosta is here today since she is trying to get her weight back on track. Had lap band/vagotomy surgery in 2008 and also has diabetes. Dr. Hassell Done will be will be doing a fill soon. Lost about 150 lbs after surgery and has gained about 100 lbs back. Most of the weight gain has been in the past 3 years and blood sugar is "out of control". Has had blood sugar over 500 mg/dl recently. Tests blood sugar 1-2 x day (averaging over 400 mg/dl). Has a son who is a heroin addict and currently in prison and has a lot of stress. Will eat if she is worried or upset. Had a head injury in 2014 and "forgets a lot" now.   Has knee pain and can't stand to cook for very long.  Not currently working and lives with her husband and grandson. She and her husband do the food shopping and the cooking.  Eats one meal per day and 5-6 snacks each day. Eats sweets now and considers them her downfall. Was not eating sweets when at her lowest weight. Used to use spenda and is using it more than she was now. Started using sugar on sweetened cereals (now uses Splenda).  Was skipping a lot of meals after surgery (when weight was lowest). Sometimes wouldn't eat for days and had to tell herself to eat.   Would like to get weight down to 225 lbs.  Preferred Learning Style:   No preference indicated   Learning Readiness:   Ready  MEDICATIONS: see list   DIETARY INTAKE:  Usual eating pattern includes 1-2 meals and 5-6 snacks per day.  Avoided foods include: sushi  24-hr recall:  B ( AM): Lucky charms or Chex with splenda with whole milk or sausage biscuit Snk ( AM): none L ( PM): none Snk ( PM): none D (5 PM): frozen meals - lasagna, steak ums, hot dogs, spaghetti or 2 fast food sandwiches Snk ( PM): little debbie cookies (multiple snacks) Beverages: coffee with powdered creamer and splenda, 3 12 oz cans Pepsi,  water  Usual physical activity: tries to walk as much as possible but has a lot of pain (neuropathy and fibromyalgia)  Estimated energy needs: 1600 calories 180 g carbohydrates 120 g protein 44 g fat  Progress Towards Goal(s):  In progress.   Nutritional Diagnosis:  NB-1.1 Food and nutrition-related knowledge deficit As related to excess consumption of sugar and frequent meal skipping.  As evidenced by BMI of 43.0, weight regain after bariatric surgery, and blood sugar readings over 400 mg/dl.    Intervention:  Nutrition counseling provided. Plan: Try writing down blood sugar readings, how much insulin you are taking, and food you recently ate. Aim to eat 3 meals per day - 1/2 your plate vegetables, 1/4 plate protein, and 1/4 plate carbohydrates. Try having frozen [employer], Smart Ones, Healthy Choices for quick meals. Look for ones with vegetables. When you are feeling like your band is restricted (after fill), eat protein first and then vegetables. Stop drinking Pepsi and adding sugar to foods. Continue to keep tempting foods out of your home. Plan to walk 2-3 minutes (driveway) with your walker each day and increase when you can.  Consider talking to a counselor to help with emotional eating.    Teaching Method Utilized:  Visual Auditory Hands on  Handouts given during visit include:  Meal Card  MyPlate Handout  Counselors in the area  Barriers to learning/adherence to lifestyle change: stress  Demonstrated degree of understanding via:  Teach Back   Monitoring/Evaluation:  Dietary intake, exercise, and body weight in 6 week(s).

## 2015-05-30 DIAGNOSIS — Z23 Encounter for immunization: Secondary | ICD-10-CM | POA: Diagnosis not present

## 2015-06-16 DIAGNOSIS — Z9889 Other specified postprocedural states: Secondary | ICD-10-CM | POA: Diagnosis not present

## 2015-06-26 ENCOUNTER — Ambulatory Visit: Payer: Medicare Other | Admitting: Dietician

## 2015-06-27 DIAGNOSIS — R399 Unspecified symptoms and signs involving the genitourinary system: Secondary | ICD-10-CM | POA: Diagnosis not present

## 2015-06-27 DIAGNOSIS — N390 Urinary tract infection, site not specified: Secondary | ICD-10-CM | POA: Diagnosis not present

## 2015-06-27 DIAGNOSIS — I10 Essential (primary) hypertension: Secondary | ICD-10-CM | POA: Diagnosis not present

## 2015-07-10 ENCOUNTER — Other Ambulatory Visit: Payer: Self-pay

## 2015-07-10 ENCOUNTER — Emergency Department (HOSPITAL_COMMUNITY): Payer: Medicare Other

## 2015-07-10 ENCOUNTER — Encounter (HOSPITAL_COMMUNITY): Payer: Self-pay | Admitting: Family Medicine

## 2015-07-10 ENCOUNTER — Emergency Department (HOSPITAL_COMMUNITY)
Admission: EM | Admit: 2015-07-10 | Discharge: 2015-07-10 | Disposition: A | Discharge status: Home / Self Care | Payer: Medicare Other | Attending: Emergency Medicine | Admitting: Emergency Medicine

## 2015-07-10 DIAGNOSIS — E039 Hypothyroidism, unspecified: Secondary | ICD-10-CM | POA: Insufficient documentation

## 2015-07-10 DIAGNOSIS — Z85850 Personal history of malignant neoplasm of thyroid: Secondary | ICD-10-CM | POA: Insufficient documentation

## 2015-07-10 DIAGNOSIS — M179 Osteoarthritis of knee, unspecified: Secondary | ICD-10-CM | POA: Insufficient documentation

## 2015-07-10 DIAGNOSIS — M19012 Primary osteoarthritis, left shoulder: Secondary | ICD-10-CM | POA: Insufficient documentation

## 2015-07-10 DIAGNOSIS — E669 Obesity, unspecified: Secondary | ICD-10-CM | POA: Diagnosis not present

## 2015-07-10 DIAGNOSIS — Z8701 Personal history of pneumonia (recurrent): Secondary | ICD-10-CM | POA: Diagnosis not present

## 2015-07-10 DIAGNOSIS — G894 Chronic pain syndrome: Secondary | ICD-10-CM | POA: Diagnosis not present

## 2015-07-10 DIAGNOSIS — Z87891 Personal history of nicotine dependence: Secondary | ICD-10-CM | POA: Insufficient documentation

## 2015-07-10 DIAGNOSIS — R0602 Shortness of breath: Secondary | ICD-10-CM | POA: Diagnosis not present

## 2015-07-10 DIAGNOSIS — M19042 Primary osteoarthritis, left hand: Secondary | ICD-10-CM | POA: Insufficient documentation

## 2015-07-10 DIAGNOSIS — Z79899 Other long term (current) drug therapy: Secondary | ICD-10-CM | POA: Diagnosis not present

## 2015-07-10 DIAGNOSIS — R509 Fever, unspecified: Secondary | ICD-10-CM | POA: Insufficient documentation

## 2015-07-10 DIAGNOSIS — R1030 Lower abdominal pain, unspecified: Secondary | ICD-10-CM | POA: Diagnosis not present

## 2015-07-10 DIAGNOSIS — M19071 Primary osteoarthritis, right ankle and foot: Secondary | ICD-10-CM | POA: Diagnosis not present

## 2015-07-10 DIAGNOSIS — M47819 Spondylosis without myelopathy or radiculopathy, site unspecified: Secondary | ICD-10-CM | POA: Diagnosis not present

## 2015-07-10 DIAGNOSIS — M19041 Primary osteoarthritis, right hand: Secondary | ICD-10-CM | POA: Insufficient documentation

## 2015-07-10 DIAGNOSIS — Z9889 Other specified postprocedural states: Secondary | ICD-10-CM | POA: Diagnosis not present

## 2015-07-10 DIAGNOSIS — Z79891 Long term (current) use of opiate analgesic: Secondary | ICD-10-CM | POA: Diagnosis not present

## 2015-07-10 DIAGNOSIS — Z8541 Personal history of malignant neoplasm of cervix uteri: Secondary | ICD-10-CM | POA: Insufficient documentation

## 2015-07-10 DIAGNOSIS — M19011 Primary osteoarthritis, right shoulder: Secondary | ICD-10-CM | POA: Diagnosis not present

## 2015-07-10 DIAGNOSIS — I10 Essential (primary) hypertension: Secondary | ICD-10-CM | POA: Diagnosis not present

## 2015-07-10 DIAGNOSIS — J441 Chronic obstructive pulmonary disease with (acute) exacerbation: Secondary | ICD-10-CM | POA: Diagnosis not present

## 2015-07-10 DIAGNOSIS — R739 Hyperglycemia, unspecified: Secondary | ICD-10-CM | POA: Diagnosis not present

## 2015-07-10 DIAGNOSIS — Z794 Long term (current) use of insulin: Secondary | ICD-10-CM | POA: Insufficient documentation

## 2015-07-10 DIAGNOSIS — M19072 Primary osteoarthritis, left ankle and foot: Secondary | ICD-10-CM | POA: Insufficient documentation

## 2015-07-10 DIAGNOSIS — E1165 Type 2 diabetes mellitus with hyperglycemia: Secondary | ICD-10-CM | POA: Insufficient documentation

## 2015-07-10 DIAGNOSIS — R6884 Jaw pain: Secondary | ICD-10-CM | POA: Diagnosis present

## 2015-07-10 DIAGNOSIS — F410 Panic disorder [episodic paroxysmal anxiety] without agoraphobia: Secondary | ICD-10-CM | POA: Insufficient documentation

## 2015-07-10 DIAGNOSIS — Z8673 Personal history of transient ischemic attack (TIA), and cerebral infarction without residual deficits: Secondary | ICD-10-CM | POA: Insufficient documentation

## 2015-07-10 DIAGNOSIS — F329 Major depressive disorder, single episode, unspecified: Secondary | ICD-10-CM | POA: Insufficient documentation

## 2015-07-10 DIAGNOSIS — R110 Nausea: Secondary | ICD-10-CM | POA: Diagnosis not present

## 2015-07-10 LAB — URINALYSIS, ROUTINE W REFLEX MICROSCOPIC
BILIRUBIN URINE: NEGATIVE
Hgb urine dipstick: NEGATIVE
Ketones, ur: NEGATIVE mg/dL
Leukocytes, UA: NEGATIVE
Nitrite: NEGATIVE
Protein, ur: NEGATIVE mg/dL
SPECIFIC GRAVITY, URINE: 1.033 — AB (ref 1.005–1.030)
Urobilinogen, UA: 0.2 mg/dL (ref 0.0–1.0)
pH: 5 (ref 5.0–8.0)

## 2015-07-10 LAB — CBG MONITORING, ED: GLUCOSE-CAPILLARY: 390 mg/dL — AB (ref 65–99)

## 2015-07-10 LAB — CBC
HCT: 42.3 % (ref 36.0–46.0)
Hemoglobin: 13.5 g/dL (ref 12.0–15.0)
MCH: 24.9 pg — ABNORMAL LOW (ref 26.0–34.0)
MCHC: 31.9 g/dL (ref 30.0–36.0)
MCV: 77.9 fL — ABNORMAL LOW (ref 78.0–100.0)
Platelets: 223 10*3/uL (ref 150–400)
RBC: 5.43 MIL/uL — ABNORMAL HIGH (ref 3.87–5.11)
RDW: 13.9 % (ref 11.5–15.5)
WBC: 10.1 10*3/uL (ref 4.0–10.5)

## 2015-07-10 LAB — BASIC METABOLIC PANEL
Anion gap: 12 (ref 5–15)
BUN: 20 mg/dL (ref 6–20)
CALCIUM: 9.9 mg/dL (ref 8.9–10.3)
CO2: 27 mmol/L (ref 22–32)
CREATININE: 1.32 mg/dL — AB (ref 0.44–1.00)
Chloride: 88 mmol/L — ABNORMAL LOW (ref 101–111)
GFR calc Af Amer: 50 mL/min — ABNORMAL LOW (ref >60)
GFR calc non Af Amer: 43 mL/min — ABNORMAL LOW (ref >60)
GLUCOSE: 474 mg/dL — AB (ref 65–99)
Potassium: 4 mmol/L (ref 3.5–5.1)
Sodium: 127 mmol/L — ABNORMAL LOW (ref 135–145)

## 2015-07-10 LAB — I-STAT TROPONIN, ED
TROPONIN I, POC: 0 ng/mL (ref 0.00–0.08)
TROPONIN I, POC: 0 ng/mL (ref 0.00–0.08)

## 2015-07-10 LAB — URINE MICROSCOPIC-ADD ON

## 2015-07-10 MED ORDER — SODIUM CHLORIDE 0.9 % IV BOLUS (SEPSIS)
1000.0000 mL | Freq: Once | INTRAVENOUS | Status: AC
  Administered 2015-07-10: 1000 mL via INTRAVENOUS

## 2015-07-10 NOTE — ED Notes (Signed)
Pt here for 4 days of intermittent chest, left arm and shoulder pain. sts cold sweats and SOB. sts she feels like her throat is closing up.

## 2015-07-10 NOTE — ED Notes (Signed)
Gave pt ice, per [place]

## 2015-07-10 NOTE — ED Provider Notes (Signed)
CSN: [id number]     Arrival date & time 07/10/15  1215 History   First MD Initiated Contact with Patient 07/10/15 1523     Chief Complaint  Patient presents with  . Neck Pain  . Chest Pain     (Consider location/radiation/quality/duration/timing/severity/associated sxs/prior Treatment) HPI   Mary Acosta is a 59 y.o. female who presents for evaluation of a period of shortness of breath today, which lasted 45 minutes, started suddenly and resolve spontaneously. She has also had intermittent episodes of nausea, fever and chills with left jaw pain, for 3 days. She has not had any chest pain, back pain, weakness or dizziness. She has mild lower abdominal pain which feels like a "UTI". She feels like her symptoms of UTI are returning, following treatment with an antibiotic, completing a course about 5 days ago. She denies cardiac history. She has chronic pain. Her sugar has been running high despite taking her usual medications. She denies headache, cough, diarrhea, or problems walking. There are no other known modifying factors.     Past Medical History  Diagnosis Date  . Anxiety     with panic attacks  . Insomnia   . COPD (chronic obstructive pulmonary disease) ([place])   . Polymyalgia rheumatica ([place])   . Fatty liver disease, nonalcoholic   . Scoliosis   . Asthma   . Depression   . History of TIA (transient ischemic attack) 11-01-2010    NO RESIDUAL  . Hyperlipidemia   . Lumbar stenosis   . Chronic pain syndrome     [facility]  . Seasonal allergies   . Chronic narcotic use   . Frequency of urination   . Urgency of urination   . Nocturia   . Vaginal pain S/P SLING  FEB 2012  . Hypertension   . OSA (obstructive sleep apnea)     NO CPAP SINCE WT LOSS  . Hypothyroidism   . IDDM (insulin dependent diabetes mellitus) ([place])   . Daily headache   . Osteoarthritis     with severe disease in knee  . DJD (degenerative joint disease)   . Arthritis     "back;  feet; hands; shoulders" (08/26/2014)  . Fibromyalgia   . Chronic lower back pain   . Thyroid cancer ([place])   . Cervical cancer ([place])   . Pneumonia "several times"  . HCAP (healthcare-associated pneumonia) 08/26/2014   Past Surgical History  Procedure Laterality Date  . Appendectomy  1982  . Tubal ligation  1983  . Hysteroscopy w/d&c  08-19-2007    PMB  . Laparoscopic gastric banding  03/01/2006    TRUNCAL VAGOTOMY/ PLACEMENT OF VG BAND  . Knee arthroscopy w/ debridement Left 03/29/2006    INTERNAL DERANGEMENT/ SEVERE DJD/ MENISCUS TEARS  . Transvaginal subureteral tape/ sling  09-28-2010    MIXED URINARY INCONTINENCE  . Revision total knee arthroplasty Left 08-29-2008; 05/2009  . Total knee arthroplasty Left 01-23-2007    SEVERE DJD  . Total thyroidectomy  11-22-2005    BILATERAL THYROID NODULES-- PAPILLARY CARCINOMA (0.5CM)/ ADENOMATOID NODULES  . Breast lumpectomy Left 02-28-2005    ATYPICAL DUCTAL HYPERPLASIA  . Cardiac catheterization  09-04-2004    NORMAL CORONARY ANATOMY/ NORMAL LVF/ EF 60%  . Laparoscopic cholecystectomy  06-10-2002  . Tonsillectomy  1969  . Transthoracic echocardiogram  12-27-2010    LVSF NORMAL / EF [id number] GRADE I DIASTOLIC DYSFUNCTION/ MILD MITRAL REGURG. / MILDLY DILATED LEFT ATRIUM/ MILDY INCREASED SYSTOLIC PRESSURE OF PULMONARY ARTERIES  .  Cardiovascular stress test  12-27-2010  DR [place]    ABNORMAL NUCLEAR STUDY W/ /MILD INFERIOR ISCHEMIA/ EF 69%/  CT HEART ANGIOGRAM ;  NO ACUTE FINDINGS  . Cystoscopy  05/18/2012    Procedure: CYSTOSCOPY;  Surgeon: Reece Packer, MD;  Location[facility];  Service: Urology;  Laterality: N/A;  examination under anethesia  . Cryoablation  05/16/2003    w/LEEP FOR ABNORMAL PAP SMEAR   Family History  Problem Relation Age of Onset  . Colon cancer Maternal Uncle     x 2  . Breast cancer Other     great aunts x 5  . Diabetes Mother   . Heart disease Father   . Heart disease Mother    Social  History  Substance Use Topics  . Smoking status: Former Smoker -- 2.50 packs/day for 39 years    Types: Cigarettes    Quit date: 10/22/2010  . Smokeless tobacco: Never Used  . Alcohol Use: No   OB History    No data available     Review of Systems  All other systems reviewed and are negative.     Allergies  Gabapentin; Losartan; Oxycodone; Sulfa antibiotics; and Sulfonamide derivatives  Home Medications   Prior to Admission medications   Medication Sig Start Date End Date Taking? Authorizing Provider  acetaminophen (TYLENOL) 500 MG tablet Take 500 mg by mouth every 4 (four) hours as needed for moderate pain.    Historical Provider, MD  albuterol (PROVENTIL HFA;VENTOLIN HFA) 108 (90 BASE) MCG/ACT inhaler Inhale 2 puffs into the lungs every 6 (six) hours as needed for wheezing or shortness of breath. 09/11/14   Melton Alar, PA-C  albuterol-ipratropium (COMBIVENT) 18-103 MCG/ACT inhaler Inhale 1-2 puffs into the lungs every 4 (four) hours as needed for wheezing or shortness of breath.    Historical Provider, MD  amitriptyline (ELAVIL) 25 MG tablet Take 100 mg by mouth at bedtime.    Historical Provider, MD  amLODipine (NORVASC) 5 MG tablet Take 1 tablet (5 mg total) by mouth daily. 08/29/14   Bobby Rumpf York, PA-C  cetirizine (ZYRTEC) 10 MG tablet Take 10 mg by mouth daily as needed for allergies.     Historical Provider, MD  clonazePAM (KLONOPIN) 0.5 MG tablet Take 0.5 mg by mouth daily as needed for anxiety.    Historical Provider, MD  insulin glargine (LANTUS) 100 UNIT/ML injection Inject 0.8 mLs (80 Units total) into the skin daily. 08/29/14   Bobby Rumpf York, PA-C  insulin lispro (HUMALOG) 100 UNIT/ML injection Inject 0.1 mLs (10 Units total) into the skin 3 (three) times daily before meals. 08/29/14   Bobby Rumpf York, PA-C  ipratropium-albuterol (DUONEB) 0.5-2.5 (3) MG/3ML SOLN Take 3 mLs by nebulization 4 (four) times daily. 10/20/14   Brand Males, MD  levothyroxine  (SYNTHROID, LEVOTHROID) 150 MCG tablet Take 150 mcg by mouth daily before breakfast.    Historical Provider, MD  morphine (MS CONTIN) 60 MG 12 hr tablet Take 60 mg by mouth every 12 (twelve) hours.    Historical Provider, MD  polyethylene glycol (MIRALAX / GLYCOLAX) packet Take 17 g by mouth 2 (two) times daily as needed for moderate constipation. 08/29/14   Bobby Rumpf York, PA-C  potassium chloride SA (K-DUR,KLOR-CON) 20 MEQ tablet Take 20 mEq by mouth as needed (for cramping).  05/30/14   Historical Provider, MD  Spacer/Aero-Holding Chambers (AEROCHAMBER PLUS WITH MASK) inhaler Use as instructed 09/08/14   Al Corpus, PA-C  valsartan (DIOVAN) 160 MG  tablet Take 1 tablet (160 mg total) by mouth daily. 12/01/14 12/01/15  Tammy S Parrett, NP   There were no vitals taken for this visit. Physical Exam  Constitutional: She is oriented to person, place, and time. She appears well-developed. No distress.  Obese  HENT:  Head: Normocephalic and atraumatic.  Right Ear: External ear normal.  Left Ear: External ear normal.  Eyes: Conjunctivae and EOM are normal. Pupils are equal, round, and reactive to light.  Neck: Normal range of motion and phonation normal. Neck supple.  Cardiovascular: Normal rate, regular rhythm and normal heart sounds.   Pulmonary/Chest: Effort normal and breath sounds normal. No respiratory distress. She has no wheezes. She exhibits no tenderness and no bony tenderness.  Abdominal: Soft. She exhibits no distension. There is no tenderness.  Musculoskeletal: Normal range of motion.  Neurological: She is alert and oriented to person, place, and time. No cranial nerve deficit or sensory deficit. She exhibits normal muscle tone. Coordination normal.  Skin: Skin is warm, dry and intact.  Psychiatric: She has a normal mood and affect. Her behavior is normal. Judgment and thought content normal.  Nursing note and vitals reviewed.   ED Course  Procedures (including critical care  time)  Medications  sodium chloride 0.9 % bolus 1,000 mL (0 mLs Intravenous Stopped 07/10/15 1745)    Patient Vitals for the past 24 hrs:  BP Temp Temp src Pulse Resp SpO2  07/10/15 1830 (!) 103/43 mmHg - - 90 12 98 %  07/10/15 1818 91/68 mmHg 98 F (36.7 C) Oral 90 13 95 %  07/10/15 1745 (!) 96/41 mmHg - - 92 11 95 %  07/10/15 1715 106/57 mmHg - - 93 22 96 %  07/10/15 1703 97/59 mmHg 97.5 F (36.4 C) Oral 94 18 97 %  07/10/15 1700 97/59 mmHg - - 95 12 97 %  07/10/15 1630 104/55 mmHg - - 97 (!) 9 94 %     At discharge - Reevaluation with update and discussion. After initial assessment and treatment, an updated evaluation reveals patient is comfortable. She is tolerating oral fluids. Her husband is here now. She states that she is only using Humalog once a day, because she only eats one meal each day. She has been having hyperglycemia, for 1 year. Findings discussed with patient and husband, all questions were answered. Swain Acree L    Labs Review Labs Reviewed  BASIC METABOLIC PANEL - Abnormal; Notable for the following:    Sodium 127 (*)    Chloride 88 (*)    Glucose, Bld 474 (*)    Creatinine, Ser 1.32 (*)    GFR calc non Af Amer 43 (*)    GFR calc Af Amer 50 (*)    All other components within normal limits  CBC - Abnormal; Notable for the following:    RBC 5.43 (*)    MCV 77.9 (*)    MCH 24.9 (*)    All other components within normal limits  I-STAT TROPOININ, ED    Imaging Review Dg Chest 2 View  07/10/2015  CLINICAL DATA:  SOB; pain in left neck and jaw EXAM: CHEST  2 VIEW COMPARISON:  09/08/2014 FINDINGS: The heart size and mediastinal contours are within normal limits. Both lungs are clear. The visualized skeletal structures are unremarkable. IMPRESSION: No active cardiopulmonary disease. Electronically Signed   By: Nolon Nations M.D.   On: 07/10/2015 13:45   I have personally reviewed and evaluated these images and lab results as part of  my medical  decision-making.   EKG Interpretation   Date/Time:  Monday July 10 2015 12:23:33 EST Ventricular Rate:  97 PR Interval:  166 QRS Duration: 132 QT Interval:  346 QTC Calculation: 439 R Axis:   56 Text Interpretation:  Critical Test Result: STEMI AGE AND GENDER  SPECIFIC ECG ANALYSIS Sinus rhythm with Premature atrial complexes  Right bundle branch block Inferior infarct , possibly acute ACUTE MI  / STEMI  Abnormal ECG Since last tracing inferior ST elevation is new  Confirmed by Eulis Foster  MD, Waldron Gerry [phone]) on 07/10/2015 3:32:41 PM      EKG Interpretation  Date/Time:  Monday July 10 2015 16:02:31 EST Ventricular Rate:  97 PR Interval:  173 QRS Duration: 137 QT Interval:  410 QTC Calculation: 521 R Axis:   57 Text Interpretation:  Sinus rhythm Right bundle branch block Inferior infarct, acute Lateral leads are also involved Since last tracing of earlier today baseline is flatter and the inferior ST segmentsappear to have < 31mm elevation Confirmed by Eulis Foster  MD, Shomari Scicchitano [id number]) on 07/10/2015 4:37:42 PM        MDM   Final diagnoses:  Hyperglycemia    Nonspecific pain, nausea and chills, without evidence for UTI, suspect infection or metabolic instability. She has moderate hyperglycemia which is likely related to only using a single dose of Humalog during the day. Doubt sepsis, ACS, pneumonia, or PE.   Nursing Notes Reviewed/ Care Coordinated Applicable Imaging Reviewed Interpretation of Laboratory Data incorporated into ED treatment  The patient appears reasonably screened and/or stabilized for discharge and I doubt any other medical condition or other [facility] requiring further screening, evaluation, or treatment in the ED at this time prior to discharge.  Plan: Home Medications- usual; Home Treatments- increase oral water; return here if the recommended treatment, does not improve the symptoms; Recommended follow up- Cal Endo. In AM to discuss increasing basal insulin  vs more frequent Humalog   Daleen Bo, MD 07/10/15 1902

## 2015-07-10 NOTE — Discharge Instructions (Signed)
Hyperglycemia °Hyperglycemia occurs when the glucose (sugar) in your blood is too high. Hyperglycemia can happen for many reasons, but it most often happens to people who do not know they have diabetes or are not managing their diabetes properly.  °CAUSES  °Whether you have diabetes or not, there are other causes of hyperglycemia. Hyperglycemia can occur when you have diabetes, but it can also occur in other situations that you might not be as aware of, such as: °Diabetes °· If you have diabetes and are having problems controlling your blood glucose, hyperglycemia could occur because of some of the following reasons: °¨ Not following your meal plan. °¨ Not taking your diabetes medications or not taking it properly. °¨ Exercising less or doing less activity than you normally do. °¨ Being sick. °Pre-diabetes °· This cannot be ignored. Before people develop Type 2 diabetes, they almost always have "pre-diabetes." This is when your blood glucose levels are higher than normal, but not yet high enough to be diagnosed as diabetes. Research has shown that some long-term damage to the body, especially the heart and circulatory system, may already be occurring during pre-diabetes. If you take action to manage your blood glucose when you have pre-diabetes, you may delay or prevent Type 2 diabetes from developing. °Stress °· If you have diabetes, you may be "diet" controlled or on oral medications or insulin to control your diabetes. However, you may find that your blood glucose is higher than usual in the hospital whether you have diabetes or not. This is often referred to as "stress hyperglycemia." Stress can elevate your blood glucose. This happens because of hormones put out by the body during times of stress. If stress has been the cause of your high blood glucose, it can be followed regularly by your caregiver. That way he/she can make sure your hyperglycemia does not continue to get worse or progress to  diabetes. °Steroids °· Steroids are medications that act on the infection fighting system (immune system) to block inflammation or infection. One side effect can be a rise in blood glucose. Most people can produce enough extra insulin to allow for this rise, but for those who cannot, steroids make blood glucose levels go even higher. It is not unusual for steroid treatments to "uncover" diabetes that is developing. It is not always possible to determine if the hyperglycemia will go away after the steroids are stopped. A special blood test called an A1c is sometimes done to determine if your blood glucose was elevated before the steroids were started. °SYMPTOMS °· Thirsty. °· Frequent urination. °· Dry mouth. °· Blurred vision. °· Tired or fatigue. °· Weakness. °· Sleepy. °· Tingling in feet or leg. °DIAGNOSIS  °Diagnosis is made by monitoring blood glucose in one or all of the following ways: °· A1c test. This is a chemical found in your blood. °· Fingerstick blood glucose monitoring. °· Laboratory results. °TREATMENT  °First, knowing the cause of the hyperglycemia is important before the hyperglycemia can be treated. Treatment may include, but is not be limited to: °· Education. °· Change or adjustment in medications. °· Change or adjustment in meal plan. °· Treatment for an illness, infection, etc. °· More frequent blood glucose monitoring. °· Change in exercise plan. °· Decreasing or stopping steroids. °· Lifestyle changes. °HOME CARE INSTRUCTIONS  °· Test your blood glucose as directed. °· Exercise regularly. Your caregiver will give you instructions about exercise. Pre-diabetes or diabetes which comes on with stress is helped by exercising. °· Eat wholesome,   balanced meals. Eat often and at regular, fixed times. Your caregiver or nutritionist will give you a meal plan to guide your sugar intake. °· Being at an ideal weight is important. If needed, losing as little as 10 to 15 pounds may help improve blood  glucose levels. °SEEK MEDICAL CARE IF:  °· You have questions about medicine, activity, or diet. °· You continue to have symptoms (problems such as increased thirst, urination, or weight gain). °SEEK IMMEDIATE MEDICAL CARE IF:  °· You are vomiting or have diarrhea. °· Your breath smells fruity. °· You are breathing faster or slower. °· You are very sleepy or incoherent. °· You have numbness, tingling, or pain in your feet or hands. °· You have chest pain. °· Your symptoms get worse even though you have been following your caregiver's orders. °· If you have any other questions or concerns. °  °This information is not intended to replace advice given to you by your health care provider. Make sure you discuss any questions you have with your health care provider. °  °Document Released: 02/05/2001 Document Revised: 11/04/2011 Document Reviewed: 04/18/2015 °Elsevier Interactive Patient Education ©2016 [employer] ° °

## 2015-07-24 DIAGNOSIS — M533 Sacrococcygeal disorders, not elsewhere classified: Secondary | ICD-10-CM | POA: Diagnosis not present

## 2015-07-24 DIAGNOSIS — M5137 Other intervertebral disc degeneration, lumbosacral region: Secondary | ICD-10-CM | POA: Diagnosis not present

## 2015-07-24 DIAGNOSIS — Z5181 Encounter for therapeutic drug level monitoring: Secondary | ICD-10-CM | POA: Diagnosis not present

## 2015-07-24 DIAGNOSIS — G894 Chronic pain syndrome: Secondary | ICD-10-CM | POA: Diagnosis not present

## 2015-07-28 ENCOUNTER — Ambulatory Visit: Payer: Medicare Other | Admitting: Cardiology

## 2015-08-27 DIAGNOSIS — A498 Other bacterial infections of unspecified site: Secondary | ICD-10-CM

## 2015-08-27 HISTORY — DX: Other bacterial infections of unspecified site: A49.8

## 2015-08-29 ENCOUNTER — Telehealth: Payer: Self-pay | Admitting: Gastroenterology

## 2015-08-29 NOTE — Telephone Encounter (Signed)
Spoke with patient and she reports abdominal pain x 2 months with bloating. Former Careers information officer patient. Scheduled with Lori Hvozdovic, PA-C on 08/31/15 at 9:30 AM.

## 2015-08-31 ENCOUNTER — Ambulatory Visit (INDEPENDENT_AMBULATORY_CARE_PROVIDER_SITE_OTHER): Payer: Medicare Other | Admitting: Physician Assistant

## 2015-08-31 ENCOUNTER — Encounter: Payer: Self-pay | Admitting: Physician Assistant

## 2015-08-31 ENCOUNTER — Other Ambulatory Visit (INDEPENDENT_AMBULATORY_CARE_PROVIDER_SITE_OTHER): Payer: Medicare Other

## 2015-08-31 VITALS — BP 108/64 | HR 76 | Ht 69.0 in | Wt 289.0 lb

## 2015-08-31 DIAGNOSIS — R109 Unspecified abdominal pain: Secondary | ICD-10-CM

## 2015-08-31 DIAGNOSIS — E108 Type 1 diabetes mellitus with unspecified complications: Secondary | ICD-10-CM | POA: Diagnosis not present

## 2015-08-31 DIAGNOSIS — IMO0002 Reserved for concepts with insufficient information to code with codable children: Secondary | ICD-10-CM

## 2015-08-31 DIAGNOSIS — E1065 Type 1 diabetes mellitus with hyperglycemia: Secondary | ICD-10-CM | POA: Diagnosis not present

## 2015-08-31 DIAGNOSIS — R1084 Generalized abdominal pain: Secondary | ICD-10-CM

## 2015-08-31 DIAGNOSIS — R140 Abdominal distension (gaseous): Secondary | ICD-10-CM

## 2015-08-31 DIAGNOSIS — R110 Nausea: Secondary | ICD-10-CM | POA: Diagnosis not present

## 2015-08-31 LAB — BASIC METABOLIC PANEL
BUN: 10 mg/dL (ref 6–23)
CHLORIDE: 95 meq/L — AB (ref 96–112)
CO2: 32 mEq/L (ref 19–32)
CREATININE: 0.99 mg/dL (ref 0.40–1.20)
Calcium: 9.6 mg/dL (ref 8.4–10.5)
GFR: 60.98 mL/min (ref >60.00)
Glucose, Bld: 344 mg/dL — ABNORMAL HIGH (ref 70–99)
Potassium: 4.3 mEq/L (ref 3.5–5.1)
Sodium: 133 mEq/L — ABNORMAL LOW (ref 135–145)

## 2015-08-31 LAB — CBC
HCT: 38.7 % (ref 36.0–46.0)
HEMOGLOBIN: 12.5 g/dL (ref 12.0–15.0)
MCHC: 32.3 g/dL (ref 30.0–36.0)
MCV: 77.4 fl — ABNORMAL LOW (ref 78.0–100.0)
PLATELETS: 174 10*3/uL (ref 150.0–400.0)
RBC: 5 Mil/uL (ref 3.87–5.11)
RDW: 15 % (ref 11.5–15.5)
WBC: 7 10*3/uL (ref 4.0–10.5)

## 2015-08-31 LAB — LIPASE: Lipase: 8 U/L — ABNORMAL LOW (ref 11.0–59.0)

## 2015-08-31 LAB — HEPATIC FUNCTION PANEL
ALBUMIN: 3.6 g/dL (ref 3.5–5.2)
ALK PHOS: 100 U/L (ref 39–117)
ALT: 16 U/L (ref 0–35)
AST: 24 U/L (ref 0–37)
Bilirubin, Direct: 0.1 mg/dL (ref 0.0–0.3)
TOTAL PROTEIN: 6.7 g/dL (ref 6.0–8.3)
Total Bilirubin: 0.4 mg/dL (ref 0.2–1.2)

## 2015-08-31 LAB — HEMOGLOBIN A1C: Hgb A1c MFr Bld: 14.9 % — ABNORMAL HIGH (ref 4.6–6.5)

## 2015-08-31 LAB — AMYLASE: AMYLASE: 10 U/L — AB (ref 27–131)

## 2015-08-31 MED ORDER — METRONIDAZOLE 250 MG PO TABS: 250.0000 mg | ORAL_TABLET | Freq: Three times a day (TID) | ORAL | Status: DC

## 2015-08-31 MED ORDER — ONDANSETRON HCL 8 MG PO TABS: 8.0000 mg | ORAL_TABLET | Freq: Three times a day (TID) | ORAL | Status: DC | PRN

## 2015-08-31 NOTE — Progress Notes (Signed)
Patient ID: Mary Acosta, female   DOB: 05/17/1956, 60 y.o.   MRN: [id number]    HPI:  Mary Acosta is a 60 y.o.   female   Who has previously been followed by Dr. Sharlett Iles. She has a rather complex history included chronic pain syndrome on chronic pain meds for which she is followed by pain management at [facility]. She is status post lap band for obesity , and has chronic functional constipation. She has had a truncal vagotomy appendectomy cholecystectomy tubal ligation thyroidectomy and breast surgery. She has a diagnosis of chronic anxiety and depression, hypertension, coronary artery disease, insulin-dependent diabetes, hyperlipidemia, and fibromyalgia rheumatica. She also has a long-standing history of COPD for which she wears oxygen at night. She has had several ER visits in the past month for hyperglycemia. She states her morning sugars for the past several months have been running between 304 100 , and she has had several episodes of sugars over 700 and the past 6 weeks. She reports that she has not been purchasing her Lantus due to cost. She has been using some of her friends insulin , and not on a daily basis. She is on morphine/MS Contin chronically, And has been on gabapentin and Lyrica in the past for peripheral neuropathy.    she presents today with ongoing complaints of abdominal pain. She reports that she has days that she is pain-free alternating with days where she has crampy postprandial pain alleviated with defecation or passage of gas. She feels extremely gassy and bloated but is unable to identify any specific foods that tend to exacerbate her symptoms. She has been experiencing nausea after eating but has not vomited. She denies epigastric pain. She last had an upper endoscopy in April 2009 at which time she was noted to have distal esophageal exudate and retained solid and liquid food in the absence of gastric outlet obstruction suggestive of gastroparesis. It was felt that  her chronic daily narcotics were contributing to this and exacerbating her gastroparesis. Hemoglobin A1c in October 2015 was 10.3.  She reports that she feels several lumps in her belly the urgent palpation and feels that they change position on a daily basis. She feels she gets very distended by the end of each day and has pain from the umbilicus to the suprapubic area. This pain decreases when she moves her bowels. He has had rare episodes of scant bloody spotting on the toilet tissue but not in several months. She is concerned that she may have a bowel obstruction or tumors growing on her intestines causing her to have the "lumps" that she feels on her skin. She denies fever, chills, or night sweats. Her pain does not radiate.    Past Medical History  Diagnosis Date  . Anxiety     with panic attacks  . Insomnia   . COPD (chronic obstructive pulmonary disease) ([place])   . Polymyalgia rheumatica ([place])   . Fatty liver disease, nonalcoholic   . Scoliosis   . Asthma   . Depression   . History of TIA (transient ischemic attack) 11-01-2010    NO RESIDUAL  . Hyperlipidemia   . Lumbar stenosis   . Chronic pain syndrome     [facility]  . Seasonal allergies   . Chronic narcotic use   . Frequency of urination   . Urgency of urination   . Nocturia   . Vaginal pain S/P SLING  FEB 2012  . Hypertension   .  OSA (obstructive sleep apnea)     NO CPAP SINCE WT LOSS  . Hypothyroidism   . IDDM (insulin dependent diabetes mellitus) ([place])   . Daily headache   . Osteoarthritis     with severe disease in knee  . DJD (degenerative joint disease)   . Arthritis     "back; feet; hands; shoulders" (08/26/2014)  . Fibromyalgia   . Chronic lower back pain   . Thyroid cancer ([place])   . Cervical cancer ([place])   . Pneumonia "several times"  . HCAP (healthcare-associated pneumonia) 08/26/2014    Past Surgical History  Procedure Laterality Date  . Appendectomy  1982  . Tubal ligation  1983  .  Hysteroscopy w/d&c  08-19-2007    PMB  . Laparoscopic gastric banding  03/01/2006    TRUNCAL VAGOTOMY/ PLACEMENT OF VG BAND  . Knee arthroscopy w/ debridement Left 03/29/2006    INTERNAL DERANGEMENT/ SEVERE DJD/ MENISCUS TEARS  . Transvaginal subureteral tape/ sling  09-28-2010    MIXED URINARY INCONTINENCE  . Revision total knee arthroplasty Left 08-29-2008; 05/2009  . Total knee arthroplasty Left 01-23-2007    SEVERE DJD  . Total thyroidectomy  11-22-2005    BILATERAL THYROID NODULES-- PAPILLARY CARCINOMA (0.5CM)/ ADENOMATOID NODULES  . Breast lumpectomy Left 02-28-2005    ATYPICAL DUCTAL HYPERPLASIA  . Cardiac catheterization  09-04-2004    NORMAL CORONARY ANATOMY/ NORMAL LVF/ EF 60%  . Laparoscopic cholecystectomy  06-10-2002  . Tonsillectomy  1969  . Transthoracic echocardiogram  12-27-2010    LVSF NORMAL / EF [id number] GRADE I DIASTOLIC DYSFUNCTION/ MILD MITRAL REGURG. / MILDLY DILATED LEFT ATRIUM/ MILDY INCREASED SYSTOLIC PRESSURE OF PULMONARY ARTERIES  . Cardiovascular stress test  12-27-2010  DR [place]    ABNORMAL NUCLEAR STUDY W/ /MILD INFERIOR ISCHEMIA/ EF 69%/  CT HEART ANGIOGRAM ;  NO ACUTE FINDINGS  . Cystoscopy  05/18/2012    Procedure: CYSTOSCOPY;  Surgeon: Reece Packer, MD;  Location: [facility];  Service: Urology;  Laterality: N/A;  examination under anethesia  . Cryoablation  05/16/2003    w/LEEP FOR ABNORMAL PAP SMEAR   Family History  Problem Relation Age of Onset  . Colon cancer Maternal Uncle     x 2  . Breast cancer Other     great aunts x 5  . Diabetes Mother   . Heart disease Father   . Heart disease Mother    Social History  Substance Use Topics  . Smoking status: Former Smoker -- 2.50 packs/day for 39 years    Types: Cigarettes    Quit date: 10/22/2010  . Smokeless tobacco: Never Used  . Alcohol Use: No   Current Outpatient Prescriptions  Medication Sig Dispense Refill  . acetaminophen (TYLENOL) 500 MG tablet Take 500 mg by  mouth every 4 (four) hours as needed for moderate pain.    Marland Kitchen albuterol (PROVENTIL HFA;VENTOLIN HFA) 108 (90 BASE) MCG/ACT inhaler Inhale 2 puffs into the lungs every 6 (six) hours as needed for wheezing or shortness of breath. 1 Inhaler 2  . amitriptyline (ELAVIL) 50 MG tablet Take 100-150 mg by mouth at bedtime.   5  . cetirizine (ZYRTEC) 10 MG tablet Take 10 mg by mouth daily as needed for allergies.     Marland Kitchen FLUVIRIN SUSP Inject 0.5 mLs into the muscle once.  0  . HYDROmorphone (DILAUDID) 4 MG tablet Take 4 mg by mouth every 6 (six) hours as needed for moderate pain.   0  . insulin glargine (LANTUS)  100 UNIT/ML injection Inject 0.8 mLs (80 Units total) into the skin daily. (Patient taking differently: Inject 80 Units into the skin at bedtime. ) 10 mL 11  . insulin lispro (HUMALOG) 100 UNIT/ML injection Inject 0.1 mLs (10 Units total) into the skin 3 (three) times daily before meals. 10 mL 11  . ipratropium-albuterol (DUONEB) 0.5-2.5 (3) MG/3ML SOLN Take 3 mLs by nebulization 4 (four) times daily. (Patient taking differently: Take 3 mLs by nebulization every 6 (six) hours as needed. ) 360 mL 11  . levothyroxine (SYNTHROID, LEVOTHROID) 150 MCG tablet Take 150 mcg by mouth daily before breakfast.    . meloxicam (MOBIC) 7.5 MG tablet Take 7.5 mg by mouth daily as needed for pain.   5  . morphine (MS CONTIN) 15 MG 12 hr tablet Take 15 mg by mouth 3 (three) times daily.    . polyethylene glycol (MIRALAX / GLYCOLAX) packet Take 17 g by mouth 2 (two) times daily as needed for moderate constipation. 14 each 0  . potassium chloride SA (K-DUR,KLOR-CON) 20 MEQ tablet Take 40 mEq by mouth daily.   1  . promethazine (PHENERGAN) 12.5 MG suppository Place 12.5 mg rectally daily as needed.  1  . Spacer/Aero-Holding Chambers (AEROCHAMBER PLUS WITH MASK) inhaler Use as instructed 1 each 2  . tiZANidine (ZANAFLEX) 2 MG tablet Take 2 mg by mouth 3 (three) times daily.  2  . valsartan-hydrochlorothiazide (DIOVAN-HCT)  160-25 MG tablet Take 1 tablet by mouth every evening.   3  . metroNIDAZOLE (FLAGYL) 250 MG tablet Take 1 tablet (250 mg total) by mouth 3 (three) times daily. 30 tablet 0  . ondansetron (ZOFRAN) 8 MG tablet Take 1 tablet (8 mg total) by mouth every 8 (eight) hours as needed for nausea or vomiting. 60 tablet 1   No current facility-administered medications for this visit.   Allergies  Allergen Reactions  . Gabapentin Swelling    Swelling in legs  . Losartan Other (See Comments)    Myalgias and muscle cramping  . Oxycodone Itching  . Sulfa Antibiotics Nausea Only and Rash  . Sulfonamide Derivatives Nausea Only     Review of Systems:  per history of present illness, otherwise negative.  Studies: /KUB   Status: Final result       PACS Images     Show images for DG UGI W/KUB     Study Result     CLINICAL DATA: History of lap band, not losing weight  EXAM: UPPER GI SERIES WITH KUB  TECHNIQUE: After obtaining a scout radiograph a routine upper GI series was performed using thin Alvester Chou  FLUOROSCOPY TIME: Radiation Exposure Index (as provided by the fluoroscopic device): 257 Gy per sq cm  If the device does not provide the exposure index:  Fluoroscopy Time (in minutes and seconds): 1 minutes 48 seconds  Number of Acquired Images:  COMPARISON: None.  FINDINGS: A preliminary film of the abdomen shows the lap band to be in good position. There is a normal phi angle of 39 degrees. The bowel gas pattern is nonspecific.  Rapid sequence spot films of the cervical esophagus show the swallowing mechanism to be normal. In the erect position, barium passes freely through the lap band into the stomach with no obstruction. There are mild to moderate tertiary contractions in the mid and distal esophagus. No hiatal hernia is seen. No gastroesophageal reflux is demonstrated.  There is some food debris within the stomach. No gastric abnormality is noted. The  duodenal bulb  is unremarkable and the duodenal loop is in normal position.  IMPRESSION: 1. The lap band is in good position with no obstruction to the flow of barium into the stomach. 2. Mild to moderate tertiary contractions in the mid and distal esophagus.   Electronically Signed  By: Ivar Drape M.D.  On: 04/05/2015 12:03     LAB RESULTS:  CBC 07/10/2015 WBC 10.1, hemoglobin 13.5, hematocrit 42.3, platelets 223,000.   Physical Exam: BP 108/64 mmHg  Pulse 76  Ht 5\' 9"  (1.753 m)  Wt 289 lb (131.09 kg)  BMI 42.66 kg/m2 Constitutional: Pleasant, obese, Caucasianfemale in no acute distress,  Who seems to have difficulty at times  keeping her eyes open-- says she is sleepy from her pain meds. HEENT: Normocephalic and atraumatic. Conjunctivae are normal. No scleral icterus. Neck supple.  No JVD Cardiovascular: Normal rate, regular rhythm.  Pulmonary/chest: Effort normal and breath sounds normal. No wheezing, rales or rhonchi. Abdominal: Soft, nondistended,  Mild diffuse tenderness to palpation throughout with  Rebound but no guarding. Bowel sounds active throughout.  No hepatomegaly. There is a small pea-sized lipoma in the right upper quadrant. Extremities: no edema Lymphadenopathy: No cervical adenopathy noted. Neurological: Alert and oriented to person place and time. Skin: Skin is warm and dry. No rashes noted. Psychiatric: Normal mood and affect. Behavior is normal.  ASSESSMENT AND PLAN:  T68-year-old female with a history of chronic pain, and poorly controlled diabetes , presenting with nausea , abdominal bloating, and diffuse abdominal pain of 2 months duration. A CBC with differential, basic metabolic panel, amylase, lipase, hepatic function panel as well as a hemoglobin A1c will be obtained. A considerable amount of time has been spent with the patient explained to her why optimization of glycemic control is necessary to diminish her symptoms. It was also explained to  her that it would help if she could cut back on the amount of narcotic she uses. ACT of the abdomen and pelvis with reduced dose IV contrast due to her diminished GFR will be obtained to evaluate for an etiology of her pain. In the meantime, she states her insurance will no longer pay for Phenergan. She will be given a trial of ondansetron 8 mg 1 by mouth every 8 hours when necessary nausea. We will also give her an empiric trial of Flagyl 250 mg 1 by mouth 3 times daily for possible small intestinal bacterial overgrowth. She will return next month at which time she would like to establish care with Dr. Loletha Carrow.    Timmothy Baranowski, Vita Barley PA-C 08/31/2015, 1:00 PM  CC: Thressa Sheller, MD

## 2015-08-31 NOTE — Patient Instructions (Addendum)
Follow up with Dr Loletha Carrow on 10-24-2015 @ 11:00 am   STOP taking Phenergan, start using Zofran.  Need to get better control of your Diabetes.   We have sent the following medications to your pharmacy for you to pick up at your convenience: Flagyl and Zofran   You have been scheduled for a CT scan of the abdomen and pelvis at [facility] ([address]---this is in the same building as [facility]).   You are scheduled on 09-04-2015 at [facility] should arrive 15 minutes prior to your appointment time for registration. Please follow the written instructions below on the day of your exam:  WARNING: IF YOU ARE ALLERGIC TO IODINE/X-RAY DYE, PLEASE NOTIFY RADIOLOGY IMMEDIATELY AT [phone]! YOU WILL BE GIVEN A 13 HOUR PREMEDICATION PREP.  1) Do not eat or drink anything after 5am(4 hours prior to your test) 2) You have been given 2 bottles of oral contrast to drink. The solution may taste better if refrigerated, but do NOT add ice or any other liquid to this solution. Shake well before drinking.    Drink 1 bottle of contrast @ 7am (2 hours prior to your exam)  Drink 1 bottle of contrast @ 8am (1 hour prior to your exam)  You may take any medications as prescribed with a small amount of water except for the following: Metformin, Glucophage, Glucovance, Avandamet, Riomet, Fortamet, Actoplus Met, Janumet, Glumetza or Metaglip. The above medications must be held the day of the exam AND 48 hours after the exam.  The purpose of you drinking the oral contrast is to aid in the visualization of your intestinal tract. The contrast solution may cause some diarrhea. Before your exam is started, you will be given a small amount of fluid to drink. Depending on your individual set of symptoms, you may also receive an intravenous injection of x-ray contrast/dye. Plan on being at [facility] for 30 minutes or longer, depending on the type of exam you are having performed.  This test  typically takes 30-45 minutes to complete.  If you have any questions regarding your exam or if you need to reschedule, you may call the CT department at [phone] between the hours of 8:00 am and 5:00 pm, Monday-Friday.

## 2015-09-04 ENCOUNTER — Ambulatory Visit (HOSPITAL_COMMUNITY): Payer: Medicare Other

## 2015-09-04 NOTE — Progress Notes (Signed)
Reviewed and agree with documentation and assessment and plan. K. Veena Fabricio Endsley , MD   

## 2015-09-06 ENCOUNTER — Encounter (HOSPITAL_COMMUNITY): Payer: Self-pay

## 2015-09-06 ENCOUNTER — Ambulatory Visit (HOSPITAL_COMMUNITY)
Admission: RE | Admit: 2015-09-06 | Discharge: 2015-09-06 | Disposition: A | Discharge status: Home / Self Care | Payer: Medicare Other | Source: Ambulatory Visit | Attending: Physician Assistant | Admitting: Physician Assistant

## 2015-09-06 DIAGNOSIS — R140 Abdominal distension (gaseous): Secondary | ICD-10-CM

## 2015-09-06 DIAGNOSIS — K7460 Unspecified cirrhosis of liver: Secondary | ICD-10-CM | POA: Insufficient documentation

## 2015-09-06 DIAGNOSIS — R110 Nausea: Secondary | ICD-10-CM

## 2015-09-06 DIAGNOSIS — R1084 Generalized abdominal pain: Secondary | ICD-10-CM

## 2015-09-06 MED ORDER — IOHEXOL 300 MG/ML  SOLN
100.0000 mL | Freq: Once | INTRAMUSCULAR | Status: AC | PRN
  Administered 2015-09-06: 100 mL via INTRAVENOUS

## 2015-09-11 ENCOUNTER — Other Ambulatory Visit: Payer: Self-pay | Admitting: *Deleted

## 2015-09-12 ENCOUNTER — Other Ambulatory Visit: Payer: Self-pay | Admitting: *Deleted

## 2015-09-12 ENCOUNTER — Other Ambulatory Visit: Payer: Self-pay | Admitting: Physician Assistant

## 2015-09-12 DIAGNOSIS — M533 Sacrococcygeal disorders, not elsewhere classified: Secondary | ICD-10-CM | POA: Diagnosis not present

## 2015-09-12 DIAGNOSIS — M5137 Other intervertebral disc degeneration, lumbosacral region: Secondary | ICD-10-CM | POA: Diagnosis not present

## 2015-09-12 DIAGNOSIS — K7460 Unspecified cirrhosis of liver: Secondary | ICD-10-CM

## 2015-09-12 DIAGNOSIS — Z5181 Encounter for therapeutic drug level monitoring: Secondary | ICD-10-CM | POA: Diagnosis not present

## 2015-09-12 DIAGNOSIS — Z794 Long term (current) use of insulin: Secondary | ICD-10-CM | POA: Diagnosis not present

## 2015-09-12 DIAGNOSIS — G894 Chronic pain syndrome: Secondary | ICD-10-CM | POA: Diagnosis not present

## 2015-09-12 DIAGNOSIS — Z79899 Other long term (current) drug therapy: Secondary | ICD-10-CM | POA: Diagnosis not present

## 2015-09-13 ENCOUNTER — Telehealth: Payer: Self-pay | Admitting: Physician Assistant

## 2015-09-13 NOTE — Telephone Encounter (Signed)
Pt called to discuss CT results. Results reviewed. She says she was told yearts ago she has fatty liver, and she suspects this is the cause of cirrhosis. She has been scheduled by offfice for U/S with elastography.

## 2015-09-13 NOTE — Telephone Encounter (Signed)
Patient calling back about the CT results of cirrhosis. She would like to talk to [employer], PA-C about this.

## 2015-09-18 ENCOUNTER — Encounter: Payer: Self-pay | Admitting: Physician Assistant

## 2015-09-20 ENCOUNTER — Telehealth: Payer: Self-pay | Admitting: Physician Assistant

## 2015-09-21 ENCOUNTER — Other Ambulatory Visit: Payer: Self-pay | Admitting: Emergency Medicine

## 2015-09-21 DIAGNOSIS — E1065 Type 1 diabetes mellitus with hyperglycemia: Secondary | ICD-10-CM

## 2015-09-21 DIAGNOSIS — R1084 Generalized abdominal pain: Secondary | ICD-10-CM

## 2015-09-21 DIAGNOSIS — R140 Abdominal distension (gaseous): Secondary | ICD-10-CM

## 2015-09-21 DIAGNOSIS — R110 Nausea: Secondary | ICD-10-CM

## 2015-09-21 DIAGNOSIS — E108 Type 1 diabetes mellitus with unspecified complications: Secondary | ICD-10-CM

## 2015-09-21 DIAGNOSIS — IMO0002 Reserved for concepts with insufficient information to code with codable children: Secondary | ICD-10-CM

## 2015-09-21 MED ORDER — ONDANSETRON HCL 8 MG PO TABS: 8.0000 mg | ORAL_TABLET | Freq: Three times a day (TID) | ORAL | Status: DC | PRN

## 2015-09-21 NOTE — Telephone Encounter (Signed)
Spoke with patients husband. He will inform her about changing her diet and continuing Zofran. I informed them that she may need to see whoever controls her diabetes if she is having trouble on her own.

## 2015-09-21 NOTE — Telephone Encounter (Signed)
Her hemoglobin A1c was 14! She needs to get better glycemic control her she will continue to be nauseous. She can increase her Zofran to every 6 hours. She should be on small volume, frequent, low-fat feedings.

## 2015-09-21 NOTE — Telephone Encounter (Signed)
Patient reports Zofran is not working and she still feels very nauseous.

## 2015-10-03 ENCOUNTER — Telehealth (HOSPITAL_COMMUNITY): Payer: Self-pay

## 2015-10-03 NOTE — Telephone Encounter (Signed)
Called to remind pt of 7am appt tomorrow in radiology at [facility]. Pt's husband agreed to have pt stay NPO and arrive at 6:45 to check in. AW

## 2015-10-04 ENCOUNTER — Telehealth: Payer: Self-pay | Admitting: Physician Assistant

## 2015-10-04 ENCOUNTER — Ambulatory Visit (HOSPITAL_COMMUNITY)
Admission: RE | Admit: 2015-10-04 | Discharge: 2015-10-04 | Disposition: A | Discharge status: Home / Self Care | Payer: Medicare Other | Source: Ambulatory Visit | Attending: Physician Assistant | Admitting: Physician Assistant

## 2015-10-04 DIAGNOSIS — E119 Type 2 diabetes mellitus without complications: Secondary | ICD-10-CM | POA: Diagnosis not present

## 2015-10-04 DIAGNOSIS — R161 Splenomegaly, not elsewhere classified: Secondary | ICD-10-CM | POA: Insufficient documentation

## 2015-10-04 DIAGNOSIS — I10 Essential (primary) hypertension: Secondary | ICD-10-CM | POA: Diagnosis not present

## 2015-10-04 DIAGNOSIS — K7460 Unspecified cirrhosis of liver: Secondary | ICD-10-CM | POA: Diagnosis not present

## 2015-10-04 NOTE — Telephone Encounter (Signed)
Patient calling for US results

## 2015-10-23 ENCOUNTER — Encounter: Payer: Self-pay | Admitting: Adult Health

## 2015-10-23 ENCOUNTER — Ambulatory Visit (INDEPENDENT_AMBULATORY_CARE_PROVIDER_SITE_OTHER): Payer: Medicare Other | Admitting: Adult Health

## 2015-10-23 VITALS — BP 120/88 | HR 86 | Temp 98.3°F | Ht 69.0 in | Wt 285.0 lb

## 2015-10-23 DIAGNOSIS — J449 Chronic obstructive pulmonary disease, unspecified: Secondary | ICD-10-CM | POA: Diagnosis not present

## 2015-10-23 DIAGNOSIS — Z23 Encounter for immunization: Secondary | ICD-10-CM

## 2015-10-23 DIAGNOSIS — J9611 Chronic respiratory failure with hypoxia: Secondary | ICD-10-CM

## 2015-10-23 NOTE — Assessment & Plan Note (Signed)
Continue on O2 

## 2015-10-23 NOTE — Progress Notes (Signed)
Subjective:    Patient ID: Mary Acosta, female    DOB: 08/06/1956, 60 y.o.   MRN: [id number]  HPI 60 yo morbidly obese female former smoker with COPD and Chronic Resp Failure on O2 At bedtime  And activity.   TEST  10/2014 PFTs today showed an FEV1 of 82%, ratio 81, FVC at 79%, no significant bronchodilator response, decreased diffusing capacity is 57%. 2-D echo done in January showed severe LVH, EF 55% , GR 1 DD  Tried spiriva in past, intolerant.  2016 ONO showed nocturnal desats she was started on O2  2016 sleep study which was neg for OSA.    10/23/2015 Follow up : CODP and Chronic O2  Pt returns for 9 month follow up for COPD .  Remains on Duoneb Four times a day   Says her cough and wheezing are much better since stopping Lisinopril last year.  Denies hemoptysis , chest pain, orthopnea, or increased edema.   Says she was recently dx with fatty liver/non alcoholic cirrhosis .  Has chronic pain on morphine and dilaudid. Followed [facility].  Flu and PVX utd.  Would like to get Prevnar vaccine today.     Past Medical History  Diagnosis Date  . Anxiety     with panic attacks  . Insomnia   . COPD (chronic obstructive pulmonary disease) ([place])   . Polymyalgia rheumatica ([place])   . Fatty liver disease, nonalcoholic   . Scoliosis   . Asthma   . Depression   . History of TIA (transient ischemic attack) 11-01-2010    NO RESIDUAL  . Hyperlipidemia   . Lumbar stenosis   . Chronic pain syndrome     [facility]  . Seasonal allergies   . Chronic narcotic use   . Frequency of urination   . Urgency of urination   . Nocturia   . Vaginal pain S/P SLING  FEB 2012  . Hypertension   . OSA (obstructive sleep apnea)     NO CPAP SINCE WT LOSS  . Hypothyroidism   . IDDM (insulin dependent diabetes mellitus) ([place])   . Daily headache   . Osteoarthritis     with severe disease in knee  . DJD (degenerative joint disease)   . Arthritis    "back; feet; hands; shoulders" (08/26/2014)  . Fibromyalgia   . Chronic lower back pain   . Thyroid cancer ([place])   . Cervical cancer ([place])   . Pneumonia "several times"  . HCAP (healthcare-associated pneumonia) 08/26/2014   Current Outpatient Prescriptions on File Prior to Visit  Medication Sig Dispense Refill  . acetaminophen (TYLENOL) 500 MG tablet Take 500 mg by mouth every 4 (four) hours as needed for moderate pain.    Marland Kitchen albuterol (PROVENTIL HFA;VENTOLIN HFA) 108 (90 BASE) MCG/ACT inhaler Inhale 2 puffs into the lungs every 6 (six) hours as needed for wheezing or shortness of breath. 1 Inhaler 2  . amitriptyline (ELAVIL) 50 MG tablet Take 100-150 mg by mouth at bedtime.   5  . cetirizine (ZYRTEC) 10 MG tablet Take 10 mg by mouth daily as needed for allergies.     Marland Kitchen HYDROmorphone (DILAUDID) 4 MG tablet Take 4 mg by mouth every 6 (six) hours as needed for moderate pain.   0  . insulin glargine (LANTUS) 100 UNIT/ML injection Inject 0.8 mLs (80 Units total) into the skin daily. (Patient taking differently: Inject 80 Units into the skin at bedtime. ) 10 mL  11  . insulin lispro (HUMALOG) 100 UNIT/ML injection Inject 0.1 mLs (10 Units total) into the skin 3 (three) times daily before meals. (Patient taking differently: Inject 30 Units into the skin 3 (three) times daily before meals. ) 10 mL 11  . ipratropium-albuterol (DUONEB) 0.5-2.5 (3) MG/3ML SOLN Take 3 mLs by nebulization 4 (four) times daily. (Patient taking differently: Take 3 mLs by nebulization every 6 (six) hours as needed. ) 360 mL 11  . levothyroxine (SYNTHROID, LEVOTHROID) 150 MCG tablet Take 150 mcg by mouth daily before breakfast.    . meloxicam (MOBIC) 7.5 MG tablet Take 7.5 mg by mouth daily as needed for pain.   5  . ondansetron (ZOFRAN) 8 MG tablet Take 1 tablet (8 mg total) by mouth every 8 (eight) hours as needed for nausea or vomiting. 60 tablet 1  . polyethylene glycol (MIRALAX / GLYCOLAX) packet Take 17 g by mouth 2 (two) times  daily as needed for moderate constipation. 14 each 0  . potassium chloride SA (K-DUR,KLOR-CON) 20 MEQ tablet Take 40 mEq by mouth daily.   1  . Spacer/Aero-Holding Chambers (AEROCHAMBER PLUS WITH MASK) inhaler Use as instructed 1 each 2  . tiZANidine (ZANAFLEX) 2 MG tablet Take 2 mg by mouth 3 (three) times daily.  2  . valsartan-hydrochlorothiazide (DIOVAN-HCT) 160-25 MG tablet Take 1 tablet by mouth every evening.   3  . FLUVIRIN SUSP Inject 0.5 mLs into the muscle once. Reported on 10/23/2015  0  . metroNIDAZOLE (FLAGYL) 250 MG tablet Take 1 tablet (250 mg total) by mouth 3 (three) times daily. (Patient not taking: Reported on 10/23/2015) 30 tablet 0  . promethazine (PHENERGAN) 12.5 MG suppository Place 12.5 mg rectally daily as needed. Reported on 10/23/2015  1   No current facility-administered medications on file prior to visit.     Review of Systems Constitutional:   No  weight loss, night sweats,  Fevers, chills, + fatigue, or  lassitude.  HEENT:   No headaches,  Difficulty swallowing,  Tooth/dental problems, or  Sore throat,                No sneezing, itching, ear ache, nasal congestion, post nasal drip,   CV:  No chest pain,  Orthopnea, PND, swelling in lower extremities, anasarca, dizziness, palpitations, syncope.   GI  No heartburn, indigestion, abdominal pain, nausea, vomiting, diarrhea, change in bowel habits, loss of appetite, bloody stools.   Resp:  No hemoptysis   Skin: no rash or lesions.  GU: no dysuria, change in color of urine, no urgency or frequency.  No flank pain, no hematuria   MS:  No joint pain or swelling.  No decreased range of motion.  No back pain.  Psych:  No change in mood or affect. No depression or anxiety.  No memory loss.         Objective:   Physical Exam Filed Vitals:   10/23/15 1101  BP: 120/88  Pulse: 86  Temp: 98.3 F (36.8 C)  TempSrc: Oral  Height: 5\' 9"  (1.753 m)  Weight: 285 lb (129.275 kg)  SpO2: 95%   Body mass index  is 42.07 kg/(m^2).  GEN: A/Ox3; pleasant , NAD, morbidly obese    HEENT:  [place]/AT,  EACs-clear, TMs-wnl, NOSE-clear, THROAT-clear, no lesions, no postnasal drip or exudate noted. Very poor dentition  NECK:  Supple w/ fair ROM; no JVD; normal carotid impulses w/o bruits; no thyromegaly or nodules palpated; no lymphadenopathy.  RESP  Clear  P &  A; w/o, wheezes/ rales/ or rhonchi.no accessory muscle use, no dullness to percussion  CARD:  RRR, no m/r/g  , tr peripheral edema, pulses intact, no cyanosis or clubbing.  GI:   Soft & nt; nml bowel sounds; no organomegaly or masses detected.  Musco: Warm bil, no deformities or joint swelling noted.   Neuro: alert, no focal deficits noted.    Skin: Warm, no lesions or rashes         Assessment & Plan:

## 2015-10-23 NOTE — Addendum Note (Signed)
Addended by: Raymondo Band D on: 10/23/2015 12:12 PM   Modules accepted: Orders

## 2015-10-23 NOTE — Patient Instructions (Addendum)
Continue  with Oxygen on  3 l/m At bedtime.  May continue to use Duoneb Four times a day  .  Prevnar vaccine today .  Follow up Dr. Chase Caller in 6 months  and As needed

## 2015-10-23 NOTE — Assessment & Plan Note (Signed)
Compensated on present regimen   Plan  Continue  with Oxygen on  2 l/m At bedtime and with activity .  May continue to use Duoneb Four times a day  .  Prevnar vaccine today .  Follow up Dr. Chase Caller in 6 months  and As needed

## 2015-10-24 ENCOUNTER — Ambulatory Visit (INDEPENDENT_AMBULATORY_CARE_PROVIDER_SITE_OTHER): Payer: Medicare Other | Admitting: Gastroenterology

## 2015-10-24 ENCOUNTER — Encounter: Payer: Self-pay | Admitting: Gastroenterology

## 2015-10-24 VITALS — BP 124/86 | HR 84 | Ht 69.0 in | Wt 283.0 lb

## 2015-10-24 DIAGNOSIS — R1084 Generalized abdominal pain: Secondary | ICD-10-CM

## 2015-10-24 DIAGNOSIS — R140 Abdominal distension (gaseous): Secondary | ICD-10-CM | POA: Diagnosis not present

## 2015-10-24 DIAGNOSIS — R110 Nausea: Secondary | ICD-10-CM

## 2015-10-24 DIAGNOSIS — K7469 Other cirrhosis of liver: Secondary | ICD-10-CM

## 2015-10-24 DIAGNOSIS — R109 Unspecified abdominal pain: Secondary | ICD-10-CM

## 2015-10-24 DIAGNOSIS — E1065 Type 1 diabetes mellitus with hyperglycemia: Secondary | ICD-10-CM

## 2015-10-24 DIAGNOSIS — IMO0002 Reserved for concepts with insufficient information to code with codable children: Secondary | ICD-10-CM

## 2015-10-24 DIAGNOSIS — E108 Type 1 diabetes mellitus with unspecified complications: Secondary | ICD-10-CM

## 2015-10-24 NOTE — Patient Instructions (Addendum)
See me in 6 months, and if the blood sugar has been under better control, we will discuss doing the upper scope.  Please call [phone] to book your follow up in 6 months.  Thank you for choosing [facility]  Dr Wilfrid Lund III

## 2015-10-24 NOTE — Progress Notes (Signed)
[facility] Progress Note  Chief Complaint: Abdominal pain, altered bowel habits, cirrhosis  Subjective History:  See APP note from last month.  Complex history  On zofran Given empiric flagyl for  ? sibo  Patient still complains of diffuse and migratory abdominal pain, altered constipation and diarrhea, nausea. She has diffuse chronic pain syndrome on chronic narcotic analgesics. Workup after last visit showed [place] cirrhotic-appearing liver on CT scan, she's been very anxious about this She has no personal or family history of liver disease, no significant alcohol use history. ROS: Cardiovascular:  no chest pain Respiratory: no dyspnea  Past Medical History: Past Medical History  Diagnosis Date  . Anxiety     with panic attacks  . Insomnia   . COPD (chronic obstructive pulmonary disease) ([place])   . Polymyalgia rheumatica ([place])   . Fatty liver disease, nonalcoholic   . Scoliosis   . Asthma   . Depression   . History of TIA (transient ischemic attack) 11-01-2010    NO RESIDUAL  . Hyperlipidemia   . Lumbar stenosis   . Chronic pain syndrome     [facility]  . Seasonal allergies   . Chronic narcotic use   . Frequency of urination   . Urgency of urination   . Nocturia   . Vaginal pain S/P SLING  FEB 2012  . Hypertension   . OSA (obstructive sleep apnea)     NO CPAP SINCE WT LOSS  . Hypothyroidism   . IDDM (insulin dependent diabetes mellitus) ([place])   . Daily headache   . Osteoarthritis     with severe disease in knee  . DJD (degenerative joint disease)   . Arthritis     "back; feet; hands; shoulders" (08/26/2014)  . Fibromyalgia   . Chronic lower back pain   . Thyroid cancer ([place])   . Cervical cancer ([place])   . Pneumonia "several times"  . HCAP (healthcare-associated pneumonia) 08/26/2014    Objective:  Med list reviewed  Vital signs in last 24 hrs: Filed Vitals:   10/24/15 1049  BP: 124/86  Pulse: 84    Physical Exam Chronically  ill-appearing woman who looks older than her stated age with an antalgic gait  HEENT: sclera anicteric, oral mucosa moist without lesions  Neck: supple, no thyromegaly, JVD or lymphadenopathy  Cardiac: RRR without murmurs, S1S2 heard, no peripheral edema  Pulm: clear to auscultation bilaterally, normal RR and effort noted  Abdomen: soft, mild scattered tenderness to light palpation of the abdominal wall , multiple lipomas, with active bowel sounds. No guarding or palpable hepatosplenomegaly  Skin; warm and dry, no jaundice or rash  Recent Labs:  Lab Results  Component Value Date   WBC 7.0 08/31/2015   HGB 12.5 08/31/2015   HCT 38.7 08/31/2015   MCV 77.4* 08/31/2015   PLT 174.0 08/31/2015   CMP     Component Value Date/Time   NA 133* 08/31/2015 1124   K 4.3 08/31/2015 1124   CL 95* 08/31/2015 1124   CO2 32 08/31/2015 1124   GLUCOSE 344* 08/31/2015 1124   BUN 10 08/31/2015 1124   CREATININE 0.99 08/31/2015 1124   CALCIUM 9.6 08/31/2015 1124   PROT 6.7 08/31/2015 1124   ALBUMIN 3.6 08/31/2015 1124   AST 24 08/31/2015 1124   ALT 16 08/31/2015 1124   ALKPHOS 100 08/31/2015 1124   BILITOT 0.4 08/31/2015 1124   GFRNONAA 43* 07/10/2015 1250   GFRAA 50* 07/10/2015 1250   Hgb A1C =  14.9 (up from 10.3 in Nov 2015)   Radiologic studies:  See recent CT scan abdomen report - cirrhosis, no hepatic mass  @ASSESSMENTPLANBEGIN @ Assessment: Encounter Diagnoses  Name Primary?  . Other cirrhosis of liver ([place]) Yes  . Diffuse abdominal pain   . Nausea   . Abdominal bloating   . Type I diabetes mellitus with complication, uncontrolled (HCC)    Prob from fatty liver Now not a good time for screening EGD b/c markedly elevated glucose - anesthesia risk. She is to see new PCP soon. Needs close endocrinology follow up  Plan:  See me in 6 months. Then reassess propriety for EGD, check AFP and HepC antibody Over half of the 30 minute encounter was spent in counseling and  coordination of care.   Topics discussed: Chronic dysmotility related to poorly controlled diabetes and chronic narcotic analgesic use, and unfortunately my lack of anything else to offer for that other than symptomatic treatment. Her morbidity and mortality are very high for glucose continues to run this high. We also discussed the nature of cirrhosis, that hers is most likely from fatty liver, which needs weight loss and good control of glucose. She does not have hepatic encephalopathy or ascites. We will screen for esophageal varices when it appears safe to do so.   [name]

## 2015-10-25 ENCOUNTER — Telehealth: Payer: Self-pay | Admitting: Gastroenterology

## 2015-10-25 NOTE — Telephone Encounter (Signed)
No mention of liver cancer in the pt chart.  Pt has cirrhosis of the liver and fatty liver the pt has been given this information and all questions answered

## 2015-10-26 DIAGNOSIS — M533 Sacrococcygeal disorders, not elsewhere classified: Secondary | ICD-10-CM | POA: Diagnosis not present

## 2015-10-26 DIAGNOSIS — Z5181 Encounter for therapeutic drug level monitoring: Secondary | ICD-10-CM | POA: Diagnosis not present

## 2015-10-26 DIAGNOSIS — M5137 Other intervertebral disc degeneration, lumbosacral region: Secondary | ICD-10-CM | POA: Diagnosis not present

## 2015-10-26 DIAGNOSIS — G894 Chronic pain syndrome: Secondary | ICD-10-CM | POA: Diagnosis not present

## 2015-10-26 DIAGNOSIS — Z79899 Other long term (current) drug therapy: Secondary | ICD-10-CM | POA: Diagnosis not present

## 2015-11-13 ENCOUNTER — Other Ambulatory Visit: Payer: Self-pay | Admitting: Physician Assistant

## 2015-11-13 ENCOUNTER — Encounter: Payer: Self-pay | Admitting: Gastroenterology

## 2015-11-14 ENCOUNTER — Telehealth: Payer: Self-pay | Admitting: Gastroenterology

## 2015-11-15 NOTE — Telephone Encounter (Signed)
Pt states she thinks she found out what was causing the abd pain.  She took several bottles of insulin from a friend and has been using that for several weeks.  Yesterday she found out that the insulin she was given had been in a refrigerator for over a month un refrigerated without her knowing it.  She disposed of the insulin she was given and purchased her own insulin from the pharmacy and has been using that since last night and feels much better, no pain and she can eat.  The pt was advised that she should not take medications from anyone and only use her prescriptions.  The pt agreed and will call back if the symptoms return.

## 2015-11-23 DIAGNOSIS — M25562 Pain in left knee: Secondary | ICD-10-CM | POA: Diagnosis not present

## 2015-11-23 DIAGNOSIS — Z794 Long term (current) use of insulin: Secondary | ICD-10-CM | POA: Diagnosis not present

## 2015-11-23 DIAGNOSIS — F419 Anxiety disorder, unspecified: Secondary | ICD-10-CM | POA: Diagnosis not present

## 2015-11-23 DIAGNOSIS — Z96652 Presence of left artificial knee joint: Secondary | ICD-10-CM | POA: Diagnosis not present

## 2015-11-23 DIAGNOSIS — M25551 Pain in right hip: Secondary | ICD-10-CM | POA: Diagnosis not present

## 2015-11-23 DIAGNOSIS — R5383 Other fatigue: Secondary | ICD-10-CM | POA: Diagnosis not present

## 2015-11-23 DIAGNOSIS — Z791 Long term (current) use of non-steroidal anti-inflammatories (NSAID): Secondary | ICD-10-CM | POA: Diagnosis not present

## 2015-11-23 DIAGNOSIS — M797 Fibromyalgia: Secondary | ICD-10-CM | POA: Diagnosis not present

## 2015-11-23 DIAGNOSIS — Z882 Allergy status to sulfonamides status: Secondary | ICD-10-CM | POA: Diagnosis not present

## 2015-11-23 DIAGNOSIS — Z885 Allergy status to narcotic agent status: Secondary | ICD-10-CM | POA: Diagnosis not present

## 2015-11-23 DIAGNOSIS — M47819 Spondylosis without myelopathy or radiculopathy, site unspecified: Secondary | ICD-10-CM | POA: Diagnosis not present

## 2015-11-23 DIAGNOSIS — M5442 Lumbago with sciatica, left side: Secondary | ICD-10-CM | POA: Diagnosis not present

## 2015-11-23 DIAGNOSIS — Z888 Allergy status to other drugs, medicaments and biological substances status: Secondary | ICD-10-CM | POA: Diagnosis not present

## 2015-11-23 DIAGNOSIS — G894 Chronic pain syndrome: Secondary | ICD-10-CM | POA: Diagnosis not present

## 2015-11-23 DIAGNOSIS — M25552 Pain in left hip: Secondary | ICD-10-CM | POA: Diagnosis not present

## 2015-11-23 DIAGNOSIS — Z79899 Other long term (current) drug therapy: Secondary | ICD-10-CM | POA: Diagnosis not present

## 2015-11-23 DIAGNOSIS — E669 Obesity, unspecified: Secondary | ICD-10-CM | POA: Diagnosis not present

## 2015-11-23 DIAGNOSIS — Z79891 Long term (current) use of opiate analgesic: Secondary | ICD-10-CM | POA: Diagnosis not present

## 2015-11-23 DIAGNOSIS — M25561 Pain in right knee: Secondary | ICD-10-CM | POA: Diagnosis not present

## 2015-11-23 DIAGNOSIS — M533 Sacrococcygeal disorders, not elsewhere classified: Secondary | ICD-10-CM | POA: Diagnosis not present

## 2015-11-28 DIAGNOSIS — M5137 Other intervertebral disc degeneration, lumbosacral region: Secondary | ICD-10-CM | POA: Diagnosis not present

## 2015-11-28 DIAGNOSIS — M25569 Pain in unspecified knee: Secondary | ICD-10-CM | POA: Diagnosis not present

## 2015-12-03 ENCOUNTER — Emergency Department (HOSPITAL_COMMUNITY): Payer: Medicare Other

## 2015-12-03 ENCOUNTER — Emergency Department (HOSPITAL_COMMUNITY)
Admission: EM | Admit: 2015-12-03 | Discharge: 2015-12-03 | Disposition: A | Discharge status: Home / Self Care | Payer: Medicare Other | Attending: Emergency Medicine | Admitting: Emergency Medicine

## 2015-12-03 ENCOUNTER — Encounter (HOSPITAL_COMMUNITY): Payer: Self-pay

## 2015-12-03 DIAGNOSIS — Z8673 Personal history of transient ischemic attack (TIA), and cerebral infarction without residual deficits: Secondary | ICD-10-CM | POA: Insufficient documentation

## 2015-12-03 DIAGNOSIS — Z8701 Personal history of pneumonia (recurrent): Secondary | ICD-10-CM | POA: Diagnosis not present

## 2015-12-03 DIAGNOSIS — E039 Hypothyroidism, unspecified: Secondary | ICD-10-CM | POA: Insufficient documentation

## 2015-12-03 DIAGNOSIS — M179 Osteoarthritis of knee, unspecified: Secondary | ICD-10-CM | POA: Insufficient documentation

## 2015-12-03 DIAGNOSIS — Z85850 Personal history of malignant neoplasm of thyroid: Secondary | ICD-10-CM | POA: Diagnosis not present

## 2015-12-03 DIAGNOSIS — Z8719 Personal history of other diseases of the digestive system: Secondary | ICD-10-CM | POA: Diagnosis not present

## 2015-12-03 DIAGNOSIS — Z794 Long term (current) use of insulin: Secondary | ICD-10-CM | POA: Insufficient documentation

## 2015-12-03 DIAGNOSIS — Z9889 Other specified postprocedural states: Secondary | ICD-10-CM | POA: Diagnosis not present

## 2015-12-03 DIAGNOSIS — E119 Type 2 diabetes mellitus without complications: Secondary | ICD-10-CM | POA: Insufficient documentation

## 2015-12-03 DIAGNOSIS — M19042 Primary osteoarthritis, left hand: Secondary | ICD-10-CM | POA: Diagnosis not present

## 2015-12-03 DIAGNOSIS — F329 Major depressive disorder, single episode, unspecified: Secondary | ICD-10-CM | POA: Insufficient documentation

## 2015-12-03 DIAGNOSIS — Z79899 Other long term (current) drug therapy: Secondary | ICD-10-CM | POA: Diagnosis not present

## 2015-12-03 DIAGNOSIS — Z8541 Personal history of malignant neoplasm of cervix uteri: Secondary | ICD-10-CM | POA: Diagnosis not present

## 2015-12-03 DIAGNOSIS — G4700 Insomnia, unspecified: Secondary | ICD-10-CM | POA: Diagnosis not present

## 2015-12-03 DIAGNOSIS — M19079 Primary osteoarthritis, unspecified ankle and foot: Secondary | ICD-10-CM | POA: Insufficient documentation

## 2015-12-03 DIAGNOSIS — G894 Chronic pain syndrome: Secondary | ICD-10-CM | POA: Diagnosis not present

## 2015-12-03 DIAGNOSIS — M19012 Primary osteoarthritis, left shoulder: Secondary | ICD-10-CM | POA: Insufficient documentation

## 2015-12-03 DIAGNOSIS — J441 Chronic obstructive pulmonary disease with (acute) exacerbation: Secondary | ICD-10-CM | POA: Insufficient documentation

## 2015-12-03 DIAGNOSIS — Z87891 Personal history of nicotine dependence: Secondary | ICD-10-CM | POA: Insufficient documentation

## 2015-12-03 DIAGNOSIS — M19041 Primary osteoarthritis, right hand: Secondary | ICD-10-CM | POA: Diagnosis not present

## 2015-12-03 DIAGNOSIS — F419 Anxiety disorder, unspecified: Secondary | ICD-10-CM | POA: Diagnosis not present

## 2015-12-03 DIAGNOSIS — N289 Disorder of kidney and ureter, unspecified: Secondary | ICD-10-CM | POA: Diagnosis not present

## 2015-12-03 DIAGNOSIS — R0602 Shortness of breath: Secondary | ICD-10-CM

## 2015-12-03 DIAGNOSIS — N2889 Other specified disorders of kidney and ureter: Secondary | ICD-10-CM | POA: Diagnosis not present

## 2015-12-03 DIAGNOSIS — Z79891 Long term (current) use of opiate analgesic: Secondary | ICD-10-CM | POA: Insufficient documentation

## 2015-12-03 DIAGNOSIS — M47819 Spondylosis without myelopathy or radiculopathy, site unspecified: Secondary | ICD-10-CM | POA: Insufficient documentation

## 2015-12-03 DIAGNOSIS — I10 Essential (primary) hypertension: Secondary | ICD-10-CM | POA: Insufficient documentation

## 2015-12-03 DIAGNOSIS — M19011 Primary osteoarthritis, right shoulder: Secondary | ICD-10-CM | POA: Insufficient documentation

## 2015-12-03 LAB — CBC WITH DIFFERENTIAL/PLATELET
BASOS ABS: 0 10*3/uL (ref 0.0–0.1)
BASOS PCT: 0 %
Eosinophils Absolute: 0.2 10*3/uL (ref 0.0–0.7)
Eosinophils Relative: 2 %
HEMATOCRIT: 38.6 % (ref 36.0–46.0)
HEMOGLOBIN: 12 g/dL (ref 12.0–15.0)
LYMPHS PCT: 19 %
Lymphs Abs: 2 10*3/uL (ref 0.7–4.0)
MCH: 25.1 pg — ABNORMAL LOW (ref 26.0–34.0)
MCHC: 31.1 g/dL (ref 30.0–36.0)
MCV: 80.8 fL (ref 78.0–100.0)
Monocytes Absolute: 0.9 10*3/uL (ref 0.1–1.0)
Monocytes Relative: 8 %
NEUTROS ABS: 7.3 10*3/uL (ref 1.7–7.7)
NEUTROS PCT: 71 %
Platelets: 187 10*3/uL (ref 150–400)
RBC: 4.78 MIL/uL (ref 3.87–5.11)
RDW: 14.5 % (ref 11.5–15.5)
WBC: 10.3 10*3/uL (ref 4.0–10.5)

## 2015-12-03 LAB — COMPREHENSIVE METABOLIC PANEL
ALBUMIN: 3.1 g/dL — AB (ref 3.5–5.0)
ALK PHOS: 81 U/L (ref 38–126)
ALT: 14 U/L (ref 14–54)
AST: 35 U/L (ref 15–41)
Anion gap: 12 (ref 5–15)
BILIRUBIN TOTAL: 1.7 mg/dL — AB (ref 0.3–1.2)
BUN: 16 mg/dL (ref 6–20)
CALCIUM: 8.6 mg/dL — AB (ref 8.9–10.3)
CO2: 27 mmol/L (ref 22–32)
CREATININE: 2.01 mg/dL — AB (ref 0.44–1.00)
Chloride: 95 mmol/L — ABNORMAL LOW (ref 101–111)
GFR calc Af Amer: 30 mL/min — ABNORMAL LOW (ref >60)
GFR, EST NON AFRICAN AMERICAN: 26 mL/min — AB (ref >60)
GLUCOSE: 210 mg/dL — AB (ref 65–99)
POTASSIUM: 4 mmol/L (ref 3.5–5.1)
Sodium: 134 mmol/L — ABNORMAL LOW (ref 135–145)
TOTAL PROTEIN: 6.8 g/dL (ref 6.5–8.1)

## 2015-12-03 LAB — I-STAT TROPONIN, ED: Troponin i, poc: 0 ng/mL (ref 0.00–0.08)

## 2015-12-03 MED ORDER — SODIUM CHLORIDE 0.9 % IV BOLUS (SEPSIS)
1000.0000 mL | Freq: Once | INTRAVENOUS | Status: AC
  Administered 2015-12-03: 1000 mL via INTRAVENOUS

## 2015-12-03 NOTE — ED Notes (Signed)
Pt. BIB [facility] for evaluation of episode of SOB today around 1230. Pt. Is prescribed 30 mg morphine PRN and daily dilaudid. Pt. Took morphine around 1000 today. Pt. Given 5 mg albuterol and 0.5 atrovent PTA. Pt. Given 2 mg narcan with improvement to symptoms and mental status. Hx COPD

## 2015-12-03 NOTE — ED Notes (Signed)
Patient transported to X-ray 

## 2015-12-03 NOTE — ED Notes (Signed)
Pt stable, ambulatory, states understanding of discharge instructions 

## 2015-12-03 NOTE — ED Notes (Signed)
Pt. Placed on 2L [place], O2 [place] 85-90% on RA. Per pt. She wears [place] while sleeping at home.

## 2015-12-03 NOTE — ED Provider Notes (Addendum)
CSN: [id number]     Arrival date & time 12/03/15  1334 History   First MD Initiated Contact with Patient 12/03/15 1336     Chief Complaint  Patient presents with  . Shortness of Breath     (Consider location/radiation/quality/duration/timing/severity/associated sxs/prior Treatment) The history is provided by the patient.  Mary Acosta is a 60 y.o. female hx of COPD, anxiety, depression, HL, chronic back pain, here with shortness of breath, possible allergic reaction. Patient states that she was in the [place]  She states that she has chronic pain and takes morphine and dilaudid regularly. Took morphine around 10 am. Around 12:30 pm, patient had some shortness of breath. She used her albuterol with no relief. EMS got there and she was tachypneic and was given duoneb. She was given 2 mg narcan since she seems sleepy and had possible allergic reaction. Denies any rash.    Past Medical History  Diagnosis Date  . Anxiety     with panic attacks  . Insomnia   . COPD (chronic obstructive pulmonary disease) ([place])   . Polymyalgia rheumatica ([place])   . Fatty liver disease, nonalcoholic   . Scoliosis   . Asthma   . Depression   . History of TIA (transient ischemic attack) 11-01-2010    NO RESIDUAL  . Hyperlipidemia   . Lumbar stenosis   . Chronic pain syndrome     [facility]  . Seasonal allergies   . Chronic narcotic use   . Frequency of urination   . Urgency of urination   . Nocturia   . Vaginal pain S/P SLING  FEB 2012  . Hypertension   . OSA (obstructive sleep apnea)     NO CPAP SINCE WT LOSS  . Hypothyroidism   . IDDM (insulin dependent diabetes mellitus) ([place])   . Daily headache   . Osteoarthritis     with severe disease in knee  . DJD (degenerative joint disease)   . Arthritis     "back; feet; hands; shoulders" (08/26/2014)  . Fibromyalgia   . Chronic lower back pain   . Thyroid cancer ([place])   . Cervical cancer ([place])    . Pneumonia "several times"  . HCAP (healthcare-associated pneumonia) 08/26/2014   Past Surgical History  Procedure Laterality Date  . Appendectomy  1982  . Tubal ligation  1983  . Hysteroscopy w/d&c  08-19-2007    PMB  . Laparoscopic gastric banding  03/01/2006    TRUNCAL VAGOTOMY/ PLACEMENT OF VG BAND  . Knee arthroscopy w/ debridement Left 03/29/2006    INTERNAL DERANGEMENT/ SEVERE DJD/ MENISCUS TEARS  . Transvaginal subureteral tape/ sling  09-28-2010    MIXED URINARY INCONTINENCE  . Revision total knee arthroplasty Left 08-29-2008; 05/2009  . Total knee arthroplasty Left 01-23-2007    SEVERE DJD  . Total thyroidectomy  11-22-2005    BILATERAL THYROID NODULES-- PAPILLARY CARCINOMA (0.5CM)/ ADENOMATOID NODULES  . Breast lumpectomy Left 02-28-2005    ATYPICAL DUCTAL HYPERPLASIA  . Cardiac catheterization  09-04-2004    NORMAL CORONARY ANATOMY/ NORMAL LVF/ EF 60%  . Laparoscopic cholecystectomy  06-10-2002  . Tonsillectomy  1969  . Transthoracic echocardiogram  12-27-2010    LVSF NORMAL / EF [id number] GRADE I DIASTOLIC DYSFUNCTION/ MILD MITRAL REGURG. / MILDLY DILATED LEFT ATRIUM/ MILDY INCREASED SYSTOLIC PRESSURE OF PULMONARY ARTERIES  . Cardiovascular stress test  12-27-2010  DR [place]    ABNORMAL NUCLEAR STUDY W/ /MILD INFERIOR  ISCHEMIA/ EF 69%/  CT HEART ANGIOGRAM ;  NO ACUTE FINDINGS  . Cystoscopy  05/18/2012    Procedure: CYSTOSCOPY;  Surgeon: Reece Packer, MD;  Location: [facility];  Service: Urology;  Laterality: N/A;  examination under anethesia  . Cryoablation  05/16/2003    w/LEEP FOR ABNORMAL PAP SMEAR   Family History  Problem Relation Age of Onset  . Colon cancer Maternal Uncle     x 2  . Breast cancer Other     great aunts x 5  . Diabetes Mother   . Heart disease Father   . Heart disease Mother    Social History  Substance Use Topics  . Smoking status: Former Smoker -- 2.50 packs/day for 39 years    Types: Cigarettes    Quit date:  10/22/2010  . Smokeless tobacco: Never Used  . Alcohol Use: No   OB History    No data available     Review of Systems  Respiratory: Positive for shortness of breath.   All other systems reviewed and are negative.     Allergies  Gabapentin; Losartan; Oxycodone; Sulfa antibiotics; and Sulfonamide derivatives  Home Medications   Prior to Admission medications   Medication Sig Start Date End Date Taking? Authorizing Provider  acetaminophen (TYLENOL) 500 MG tablet Take 500 mg by mouth every 4 (four) hours as needed for moderate pain.    Historical Provider, MD  albuterol (PROVENTIL HFA;VENTOLIN HFA) 108 (90 BASE) MCG/ACT inhaler Inhale 2 puffs into the lungs every 6 (six) hours as needed for wheezing or shortness of breath. 09/11/14   Bobby Rumpf York, PA-C  amitriptyline (ELAVIL) 50 MG tablet Take 100-150 mg by mouth at bedtime.  06/09/15   Historical Provider, MD  cetirizine (ZYRTEC) 10 MG tablet Take 10 mg by mouth daily as needed for allergies.     Historical Provider, MD  FLUVIRIN SUSP Inject 0.5 mLs into the muscle once. Reported on 10/23/2015 05/30/15   Historical Provider, MD  HYDROmorphone (DILAUDID) 4 MG tablet Take 4 mg by mouth every 6 (six) hours as needed for moderate pain.  07/04/15   Historical Provider, MD  insulin glargine (LANTUS) 100 UNIT/ML injection Inject 0.8 mLs (80 Units total) into the skin daily. Patient taking differently: Inject 80 Units into the skin at bedtime.  08/29/14   Bobby Rumpf York, PA-C  insulin lispro (HUMALOG) 100 UNIT/ML injection Inject 0.1 mLs (10 Units total) into the skin 3 (three) times daily before meals. Patient taking differently: Inject 30 Units into the skin 3 (three) times daily before meals.  08/29/14   Bobby Rumpf York, PA-C  ipratropium-albuterol (DUONEB) 0.5-2.5 (3) MG/3ML SOLN Take 3 mLs by nebulization 4 (four) times daily. Patient taking differently: Take 3 mLs by nebulization every 6 (six) hours as needed.  10/20/14   Brand Males, MD   levothyroxine (SYNTHROID, LEVOTHROID) 150 MCG tablet Take 150 mcg by mouth daily before breakfast.    Historical Provider, MD  meloxicam (MOBIC) 7.5 MG tablet Take 7.5 mg by mouth daily as needed for pain.  06/30/15   Historical Provider, MD  metroNIDAZOLE (FLAGYL) 250 MG tablet Take 1 tablet (250 mg total) by mouth 3 (three) times daily. Patient not taking: Reported on 10/23/2015 08/31/15   Lori P Hvozdovic, PA-C  morphine (MS CONTIN) 30 MG 12 hr tablet Take 30 mg by mouth 2 (two) times daily. 09/12/15   Historical Provider, MD  ondansetron (ZOFRAN) 8 MG tablet Take 1 tablet (8 mg total) by  mouth every 8 (eight) hours as needed for nausea or vomiting. 09/21/15   Lori P Hvozdovic, PA-C  polyethylene glycol (MIRALAX / GLYCOLAX) packet Take 17 g by mouth 2 (two) times daily as needed for moderate constipation. 08/29/14   Bobby Rumpf York, PA-C  potassium chloride SA (K-DUR,KLOR-CON) 20 MEQ tablet Take 40 mEq by mouth daily.  05/30/14   Historical Provider, MD  Spacer/Aero-Holding Chambers (AEROCHAMBER PLUS WITH MASK) inhaler Use as instructed 09/08/14   Al Corpus, PA-C  tiZANidine (ZANAFLEX) 2 MG tablet Take 2 mg by mouth 3 (three) times daily. 05/30/15   Historical Provider, MD  valsartan-hydrochlorothiazide (DIOVAN-HCT) 160-25 MG tablet Take 1 tablet by mouth every evening.  06/27/15   Historical Provider, MD   BP 104/66 mmHg  Pulse 73  Temp(Src) 98.4 F (36.9 C) (Oral)  Resp 11  SpO2 92% Physical Exam  Constitutional: She is oriented to person, place, and time.  Anxious   HENT:  Head: Normocephalic.  Mouth/Throat: Oropharynx is clear and moist.  Eyes: Conjunctivae are normal. Pupils are equal, round, and reactive to light.  Neck: Normal range of motion. Neck supple.  Cardiovascular: Normal rate, regular rhythm and normal heart sounds.   Pulmonary/Chest: Effort normal and breath sounds normal. No respiratory distress. She has no wheezes. She has no rales.  Abdominal: Soft. Bowel sounds are  normal. She exhibits no distension. There is no tenderness. There is no rebound.  Musculoskeletal: Normal range of motion. She exhibits no edema or tenderness.  Neurological: She is alert and oriented to person, place, and time. No cranial nerve deficit. Coordination normal.  Skin: Skin is warm and dry.  No rash   Psychiatric: She has a normal mood and affect. Her behavior is normal. Judgment and thought content normal.  Nursing note and vitals reviewed.   ED Course  Procedures (including critical care time) Labs Review Labs Reviewed  CBC WITH DIFFERENTIAL/PLATELET - Abnormal; Notable for the following:    MCH 25.1 (*)    All other components within normal limits  COMPREHENSIVE METABOLIC PANEL - Abnormal; Notable for the following:    Sodium 134 (*)    Chloride 95 (*)    Glucose, Bld 210 (*)    Creatinine, Ser 2.01 (*)    Calcium 8.6 (*)    Albumin 3.1 (*)    Total Bilirubin 1.7 (*)    GFR calc non Af Amer 26 (*)    GFR calc Af Amer 30 (*)    All other components within normal limits  I-STAT TROPOININ, ED    Imaging Review Dg Chest 2 View  12/03/2015  CLINICAL DATA:  Shortness of breath while driving. History of diabetes. EXAM: CHEST  2 VIEW COMPARISON:  07/10/2015 FINDINGS: The heart size and mediastinal contours are within normal limits. Both lungs are clear. The visualized skeletal structures are unremarkable. IMPRESSION: No active cardiopulmonary disease. Electronically Signed   By: Kerby Moors M.D.   On: 12/03/2015 14:34   I have personally reviewed and evaluated these images and lab results as part of my medical decision-making.   EKG Interpretation   Date/Time:  Sunday December 03 2015 13:49:29 EDT Ventricular Rate:  74 PR Interval:  217 QRS Duration: 156 QT Interval:  426 QTC Calculation: 473 R Axis:   56 Text Interpretation:  Sinus rhythm Prolonged PR interval Right bundle  branch block No significant change since last tracing Confirmed by Jerimy Johanson   MD, Corianne Buccellato  ([id number]) on 12/03/2015 2:03:07 PM Also confirmed by Darl Householder  MD,  Demonica Farrey  ([id number]), editor [place], [place], [place] [phone])  on 12/03/2015 2:44:57 PM      MDM   Final diagnoses:  None   Mary Acosta is a 60 y.o. female here with shortness of breath. I think likely anxiety vs panic attack. Has been on morphine and dilaudid chronically and has no previous reactions. No rash or lip swelling to suggest allergic reaction. Will check labs, CXR.   3:09 PM CXR clear. CMP showed Cr. 2, baseline around 1. Has been drinking a lot of sweet tea and not much water. No urinary symptoms. Likely dehydration causing renal insufficiency. BP low 100s, which is baseline per patient. No wheezing on exam, CXR clear. Has hx of COPD but states that it is different. Not short of breath now. She normally wears oxygen when she sleeps and O2 85-90% on RA when she sleeps and about 95% on RA. She has no hx of PE and not tachycardic or short of breath now. I doubt PE. Has PCP appointment on 4/13. Recommend increase water use and recheck BMP in a week. Given 1L [place] in the ED.     Wandra Arthurs, MD 12/03/15 1511  Wandra Arthurs, MD 12/03/15 [phone]

## 2015-12-03 NOTE — Discharge Instructions (Signed)
Stay hydrated. Drink more water and avoid sweet tea.   Your kidney function is slightly abnormal. You need to have it rechecked in a week with your doctor.   Avoid taking morphine or dilaudid unless you have severe pain.   See your doctor   Return to ER if you have severe pain, trouble breathing, rash, shortness of breath, chest pain, vomiting, fevers.

## 2015-12-05 ENCOUNTER — Telehealth: Payer: Self-pay | Admitting: Gastroenterology

## 2015-12-06 NOTE — Telephone Encounter (Signed)
Do you approve the refills for pts Flagyl?

## 2015-12-07 ENCOUNTER — Other Ambulatory Visit (INDEPENDENT_AMBULATORY_CARE_PROVIDER_SITE_OTHER): Payer: Medicare Other

## 2015-12-07 ENCOUNTER — Encounter: Payer: Self-pay | Admitting: Internal Medicine

## 2015-12-07 ENCOUNTER — Ambulatory Visit (INDEPENDENT_AMBULATORY_CARE_PROVIDER_SITE_OTHER): Payer: Medicare Other | Admitting: Internal Medicine

## 2015-12-07 VITALS — BP 104/70 | HR 111 | Temp 100.2°F | Resp 18 | Wt 276.0 lb

## 2015-12-07 DIAGNOSIS — E139 Other specified diabetes mellitus without complications: Secondary | ICD-10-CM | POA: Diagnosis not present

## 2015-12-07 DIAGNOSIS — R300 Dysuria: Secondary | ICD-10-CM | POA: Diagnosis not present

## 2015-12-07 DIAGNOSIS — I10 Essential (primary) hypertension: Secondary | ICD-10-CM | POA: Diagnosis not present

## 2015-12-07 DIAGNOSIS — G894 Chronic pain syndrome: Secondary | ICD-10-CM | POA: Diagnosis not present

## 2015-12-07 DIAGNOSIS — E038 Other specified hypothyroidism: Secondary | ICD-10-CM

## 2015-12-07 DIAGNOSIS — G4700 Insomnia, unspecified: Secondary | ICD-10-CM

## 2015-12-07 DIAGNOSIS — K7460 Unspecified cirrhosis of liver: Secondary | ICD-10-CM | POA: Insufficient documentation

## 2015-12-07 LAB — COMPREHENSIVE METABOLIC PANEL
ALT: 8 U/L (ref 0–35)
AST: 15 U/L (ref 0–37)
Albumin: 3.4 g/dL — ABNORMAL LOW (ref 3.5–5.2)
Alkaline Phosphatase: 82 U/L (ref 39–117)
BILIRUBIN TOTAL: 0.7 mg/dL (ref 0.2–1.2)
BUN: 15 mg/dL (ref 6–23)
CALCIUM: 9.3 mg/dL (ref 8.4–10.5)
CO2: 34 meq/L — AB (ref 19–32)
Chloride: 96 mEq/L (ref 96–112)
Creatinine, Ser: 1.1 mg/dL (ref 0.40–1.20)
GFR: 53.95 mL/min — AB (ref >60.00)
GLUCOSE: 270 mg/dL — AB (ref 70–99)
POTASSIUM: 3.9 meq/L (ref 3.5–5.1)
Sodium: 134 mEq/L — ABNORMAL LOW (ref 135–145)
Total Protein: 7.2 g/dL (ref 6.0–8.3)

## 2015-12-07 LAB — URINALYSIS, ROUTINE W REFLEX MICROSCOPIC
BILIRUBIN URINE: NEGATIVE
LEUKOCYTES UA: NEGATIVE
NITRITE: NEGATIVE
PH: 5.5 (ref 5.0–8.0)
Specific Gravity, Urine: >=1.03 — AB (ref 1.000–1.030)
TOTAL PROTEIN, URINE-UPE24: 30 — AB
URINE GLUCOSE: 250 — AB
UROBILINOGEN UA: 0.2 (ref 0.0–1.0)

## 2015-12-07 LAB — MICROALBUMIN / CREATININE URINE RATIO
Creatinine,U: 271.5 mg/dL
MICROALB UR: 3.3 mg/dL — AB (ref 0.0–1.9)
Microalb Creat Ratio: 1.2 mg/g (ref 0.0–30.0)

## 2015-12-07 LAB — HEMOGLOBIN A1C: Hgb A1c MFr Bld: 12.8 % — ABNORMAL HIGH (ref 4.6–6.5)

## 2015-12-07 MED ORDER — METRONIDAZOLE 500 MG PO TABS: 500.0000 mg | ORAL_TABLET | Freq: Three times a day (TID) | ORAL | Status: DC

## 2015-12-07 NOTE — Assessment & Plan Note (Signed)
Following with GI encouraged weight loss Working on improving sugars - encouraged her to followup with endocrine

## 2015-12-07 NOTE — Assessment & Plan Note (Signed)
Takes pain medication for chronic back pain, SI joint pain, neuropathy, fibromyalgia meds prescribed by pain management

## 2015-12-07 NOTE — Telephone Encounter (Signed)
Pt contacted. Reviewed the risks of using Flagyl as described by Dr Loletha Carrow. Rx sent to [facility] per pt request.

## 2015-12-07 NOTE — Assessment & Plan Note (Signed)
BP well controlled Current regimen effective and well tolerated Continue current medications at current doses Check cmp 

## 2015-12-07 NOTE — Telephone Encounter (Signed)
I cannot seem to find the portal message she sent her or my reply.  Those do not seem to be in Epic notes or encounters.  I recall that I told her I could do that one time if she felt it helped.  It cannot be done more than every few months because there is the risk of peripheral neuropathy from chronic use of this medication.  We do not have reliable testing for small bowel bacterial overgrowth at our institution.  And rifaximin, which is also used for the condition, is very expensive and difficult to get, especially when we do not have the testing available to prove the diagnosis.  For now, yes, please prescribe flagyl 500 mg , one tablet three times daily for 7 days, no refills

## 2015-12-07 NOTE — Assessment & Plan Note (Signed)
Managed by endo - encouraged her to f/u with her Work on weight loss Check urine micro

## 2015-12-07 NOTE — Progress Notes (Signed)
Pre visit review using our clinic review tool, if applicable. No additional management support is needed unless otherwise documented below in the visit note. 

## 2015-12-07 NOTE — Progress Notes (Signed)
Subjective:    Patient ID: Mary Acosta, female    DOB: November 12, 1955, 60 y.o.   MRN: [id number]  HPI She is here to establish with a new pcp.    COPD:  She is following with pulmonary.  She is on oxygen 2L/min at bedtime and with activity.  She has shortness of breath with activity.  She denies a chronic cough and wheeze.  She uses her inhalers daily.    Hypertension: She is taking her medication daily. She is not compliant with a low sodium diet.  She denies chest pain, palpitations, edema, shortness of breath and regular headaches. She is not exercising regularly.  She does monitor her blood pressure at home and it is variable.    Diabetes:  She follows with Dr. Debbora Presto - she has not seen her this year.  She is taking her medication daily as prescribed. She is compliant with a diabetic diet. She is not exercising regularly. She monitors her sugars and they have been running 260 - 350, which is improved. She is up-to-date with an ophthalmology examination.   Hypothyroidism:  She is taking her medication daily.  Endocrine is managing her thyroid medication.    Liver cirrhosis:  She is seeing GI.  It is thought the cirrhosis is secondary to fatty liver and poor diabetic control.  She denies significant alcohol intake.    Diarrhea:  She has had diarrhea for three weeks.  She went to he ED last week and had IVF.  She has not eaten much because she is concerned it will cause diarrhea.  She has had 4 episodes this morning and three episodes over night.  She denies blood in her stool.  She was given a prescription in January from GI- it was an antibiotic.  This did stop the diarrhea.  She has central, suprapubic and epigastric abdominal pain.  She denies nausea. GI has agreed to prescribe the flagyl today for 7 days.    Peripheral neuropathy:  She is taking amitriptyline nightly.  She thinks this is from her diabetes.  She feels the medication is helping.    Chronic pain syndrome:  She is  following with pain management at [facility].  She has pain from her SI joint, fibromyalgia, neuropathy, back pain.  She is taking her medications daily as prescribed.  She has had nerve blocks in the past - she had one done on her knee at there last visit.    Medications and allergies reviewed with patient and updated if appropriate.  Patient Active Problem List   Diagnosis Date Noted  . Cough 12/01/2014  . Chronic respiratory failure ([place]) 11/10/2014  . Physical deconditioning 09/26/2014  . COLD (chronic obstructive lung disease) ([place]) 09/26/2014  . Dyspnea 09/26/2014  . Shortness of breath 09/08/2014  . SOB (shortness of breath) 09/08/2014  . AKI (acute kidney injury) ([place]) 09/08/2014  . HCAP (healthcare-associated pneumonia) 08/26/2014  . Pedal edema 08/26/2014  . HTN (hypertension) 08/26/2014  . COPD (chronic obstructive pulmonary disease) ([place]) 08/26/2014  . Hyperglycemia 07/09/2014  . Heme positive stool 04/05/2011  . Other general symptoms  04/05/2011  . Iron deficiency anemia, unspecified  04/05/2011  . Bariatric surgery status 04/05/2011  . History of gastroesophageal reflux (GERD) 04/05/2011  . Diarrhea 03/04/2011  . Left arm pain   . UNSPECIFIED VITAMIN D DEFICIENCY 10/22/2007  . LOW BACK PAIN, CHRONIC 10/22/2007  . INSOMNIA 10/22/2007  . OBESITY 10/14/2007  . CHRONIC PAIN SYNDROME 10/14/2007  . CARPAL  TUNNEL SYNDROME 10/14/2007  . PERIPHERAL NEUROPATHY 10/14/2007  . ALLERGIC RHINITIS CAUSE UNSPECIFIED 10/14/2007  . ARTHRITIS 10/14/2007  . FATTY LIVER DISEASE, HX OF 10/14/2007  . Hypothyroid 10/14/2007  . Diabetes 1.5, managed as type 2 ([place]) 09/14/2007  . HLD (hyperlipidemia) 09/14/2007  . TOBACCO USER 09/07/2007  . UNSPECIFIED DISORDER OF THYROID 08/25/2007  . HYPERCHOLESTEROLEMIA 08/25/2007  . Anxiety state 08/25/2007  . DEPRESSION 08/25/2007  . Essential hypertension 08/25/2007  . ASTHMA 08/25/2007  . CONSTIPATION 08/25/2007  . POLYMYALGIA RHEUMATICA 08/25/2007   . FOOT PAIN, CHRONIC 08/25/2007  . Sleep apnea 08/25/2007  . LEG EDEMA, BILATERAL 08/25/2007  . GERD 09/08/2002    Current Outpatient Prescriptions on File Prior to Visit  Medication Sig Dispense Refill  . acetaminophen (TYLENOL) 500 MG tablet Take 500 mg by mouth every 4 (four) hours as needed for moderate pain.    Marland Kitchen albuterol (PROVENTIL HFA;VENTOLIN HFA) 108 (90 BASE) MCG/ACT inhaler Inhale 2 puffs into the lungs every 6 (six) hours as needed for wheezing or shortness of breath. 1 Inhaler 2  . amitriptyline (ELAVIL) 50 MG tablet Take 100 mg by mouth daily.   5  . cetirizine (ZYRTEC) 10 MG tablet Take 10 mg by mouth daily as needed for allergies.     Marland Kitchen HYDROmorphone (DILAUDID) 4 MG tablet Take 4 mg by mouth every 6 (six) hours as needed for moderate pain.   0  . Insulin NPH Human, Isophane, (RELION N [place]) Inject 40 Units into the skin 2 (two) times daily.    . Insulin Regular Human (RELION R IJ) Inject 10-40 Units as directed 3 (three) times daily with meals.    Marland Kitchen ipratropium-albuterol (DUONEB) 0.5-2.5 (3) MG/3ML SOLN Take 3 mLs by nebulization 4 (four) times daily. (Patient taking differently: Take 3 mLs by nebulization every 6 (six) hours as needed. ) 360 mL 11  . levothyroxine (SYNTHROID, LEVOTHROID) 150 MCG tablet Take 150 mcg by mouth daily before breakfast.    . meloxicam (MOBIC) 7.5 MG tablet Take 7.5 mg by mouth daily as needed for pain.   5  . morphine (MS CONTIN) 30 MG 12 hr tablet Take 30 mg by mouth 2 (two) times daily.    . ondansetron (ZOFRAN) 8 MG tablet Take 1 tablet (8 mg total) by mouth every 8 (eight) hours as needed for nausea or vomiting. 60 tablet 1  . polyethylene glycol (MIRALAX / GLYCOLAX) packet Take 17 g by mouth 2 (two) times daily as needed for moderate constipation. 14 each 0  . potassium chloride SA (K-DUR,KLOR-CON) 20 MEQ tablet Take 40 mEq by mouth daily as needed (for cramping).   1  . Spacer/Aero-Holding Chambers (AEROCHAMBER PLUS WITH MASK) inhaler Use as  instructed 1 each 2  . tiZANidine (ZANAFLEX) 2 MG tablet Take 2 mg by mouth 3 (three) times daily.  2  . valsartan-hydrochlorothiazide (DIOVAN-HCT) 160-25 MG tablet Take 1 tablet by mouth daily as needed.   3   No current facility-administered medications on file prior to visit.    Past Medical History  Diagnosis Date  . Anxiety     with panic attacks  . Insomnia   . COPD (chronic obstructive pulmonary disease) ([place])   . Polymyalgia rheumatica ([place])   . Fatty liver disease, nonalcoholic   . Scoliosis   . Asthma   . Depression   . History of TIA (transient ischemic attack) 11-01-2010    NO RESIDUAL  . Hyperlipidemia   . Lumbar stenosis   . Chronic pain  syndrome     [facility]  . Seasonal allergies   . Chronic narcotic use   . Frequency of urination   . Urgency of urination   . Nocturia   . Vaginal pain S/P SLING  FEB 2012  . Hypertension   . OSA (obstructive sleep apnea)     NO CPAP SINCE WT LOSS  . Hypothyroidism   . IDDM (insulin dependent diabetes mellitus) ([place])   . Daily headache   . Osteoarthritis     with severe disease in knee  . DJD (degenerative joint disease)   . Arthritis     "back; feet; hands; shoulders" (08/26/2014)  . Fibromyalgia   . Chronic lower back pain   . Thyroid cancer ([place])   . Cervical cancer ([place])   . Pneumonia "several times"  . HCAP (healthcare-associated pneumonia) 08/26/2014    Past Surgical History  Procedure Laterality Date  . Appendectomy  1982  . Tubal ligation  1983  . Hysteroscopy w/d&c  08-19-2007    PMB  . Laparoscopic gastric banding  03/01/2006    TRUNCAL VAGOTOMY/ PLACEMENT OF VG BAND  . Knee arthroscopy w/ debridement Left 03/29/2006    INTERNAL DERANGEMENT/ SEVERE DJD/ MENISCUS TEARS  . Transvaginal subureteral tape/ sling  09-28-2010    MIXED URINARY INCONTINENCE  . Revision total knee arthroplasty Left 08-29-2008; 05/2009  . Total knee arthroplasty Left 01-23-2007    SEVERE DJD  . Total  thyroidectomy  11-22-2005    BILATERAL THYROID NODULES-- PAPILLARY CARCINOMA (0.5CM)/ ADENOMATOID NODULES  . Breast lumpectomy Left 02-28-2005    ATYPICAL DUCTAL HYPERPLASIA  . Cardiac catheterization  09-04-2004    NORMAL CORONARY ANATOMY/ NORMAL LVF/ EF 60%  . Laparoscopic cholecystectomy  06-10-2002  . Tonsillectomy  1969  . Transthoracic echocardiogram  12-27-2010    LVSF NORMAL / EF [id number] GRADE I DIASTOLIC DYSFUNCTION/ MILD MITRAL REGURG. / MILDLY DILATED LEFT ATRIUM/ MILDY INCREASED SYSTOLIC PRESSURE OF PULMONARY ARTERIES  . Cardiovascular stress test  12-27-2010  DR [place]    ABNORMAL NUCLEAR STUDY W/ /MILD INFERIOR ISCHEMIA/ EF 69%/  CT HEART ANGIOGRAM ;  NO ACUTE FINDINGS  . Cystoscopy  05/18/2012    Procedure: CYSTOSCOPY;  Surgeon: Reece Packer, MD;  Location[facility];  Service: Urology;  Laterality: N/A;  examination under anethesia  . Cryoablation  05/16/2003    w/LEEP FOR ABNORMAL PAP SMEAR    Social History   Social History  . Marital Status: Married    Spouse Name: N/A  . Number of Children: 2  . Years of Education: N/A   Occupational History  . disabled    Social History Main Topics  . Smoking status: Former Smoker -- 2.50 packs/day for 39 years    Types: Cigarettes    Quit date: 10/22/2010  . Smokeless tobacco: Never Used  . Alcohol Use: No  . Drug Use: No  . Sexual Activity: Not Currently   Other Topics Concern  . Not on file   Social History Narrative    Family History  Problem Relation Age of Onset  . Colon cancer Maternal Uncle     x 2  . Breast cancer Other     great aunts x 5  . Diabetes Mother   . Heart disease Father   . Heart disease Mother     Review of Systems  Constitutional: Positive for fatigue. Negative for fever.  HENT: Negative for hearing loss.   Eyes: Negative for visual disturbance.  Respiratory: Positive for shortness of breath (with exertion). Negative for cough and wheezing.   Cardiovascular:  Positive for palpitations (sometimes when she gets up at night to go to the bathroom) and leg swelling. Negative for chest pain.       No gerd  Gastrointestinal: Positive for abdominal pain and diarrhea. Negative for nausea and blood in stool.  Genitourinary: Positive for dysuria (2 days). Negative for frequency and hematuria.  Musculoskeletal: Positive for myalgias and back pain.  Neurological: Positive for dizziness, light-headedness and numbness (toes, extends up to knees). Negative for headaches.  Psychiatric/Behavioral: Positive for sleep disturbance (controlled with melatonin) and dysphoric mood. Negative for suicidal ideas. The patient is nervous/anxious.        Objective:   Filed Vitals:   12/07/15 1047  BP: 104/70  Pulse: 111  Temp: 100.2 F (37.9 C)  Resp: 18   Filed Weights   12/07/15 1047  Weight: 276 lb (125.193 kg)   Body mass index is 40.74 kg/(m^2).   Physical Exam Constitutional: She appears chronically ill. No distress, but appears over medicated/slightly sedated.  HENT:  Head: Normocephalic and atraumatic.  Right Ear: External ear normal. Normal ear canal and TM Left Ear: External ear normal.  Normal ear canal and TM Mouth/Throat: Oropharynx is clear and moist. Poor dentition Eyes: Conjunctivae and EOM are normal.  Neck: Neck supple. No tracheal deviation present. No thyromegaly present - scar from thyroidectomy.  No carotid bruit  Cardiovascular: Normal rate, regular rhythm and normal heart sounds.   2/6 systolic murmur heard.  1+  Edema with chronic skin changes - hyperpigmentation. Pulmonary/Chest: Effort normal and breath sounds normal. No respiratory distress. She has no wheezes. She has no rales.  Abdominal: Soft. Obese. There is tenderness that is fairly diffuse w/o rebound or guarding.  Lymphadenopathy: She has no cervical adenopathy.  Skin: Skin is warm and dry. She is not diaphoretic.  Psychiatric: She has a normal mood and affect. Her behavior  is normal.       Assessment & Plan:   Dysuria: Present for two days Need to rule out UTI given diarrhea and low grade fever Check Ua, Ucx    See Problem List for Assessment and Plan of chronic medical problems.  F/u in 6 months

## 2015-12-07 NOTE — Assessment & Plan Note (Addendum)
Controlled with melatonin, also taking amitriptyline at night

## 2015-12-07 NOTE — Assessment & Plan Note (Signed)
Managed by endo

## 2015-12-07 NOTE — Patient Instructions (Signed)
  Test(s) ordered today. Your results will be released to MyChart (or called to you) after review, usually within 72hours after test completion. If any changes need to be made, you will be notified at that same time.  Medications reviewed and updated.  No changes recommended at this time.    Please followup in 6 months   

## 2015-12-09 LAB — URINE CULTURE

## 2015-12-10 ENCOUNTER — Other Ambulatory Visit: Payer: Self-pay | Admitting: Internal Medicine

## 2015-12-10 MED ORDER — NITROFURANTOIN MONOHYD MACRO 100 MG PO CAPS: 100.0000 mg | ORAL_CAPSULE | Freq: Two times a day (BID) | ORAL | Status: DC

## 2016-01-02 ENCOUNTER — Telehealth: Payer: Self-pay | Admitting: Emergency Medicine

## 2016-01-02 ENCOUNTER — Telehealth: Payer: Self-pay | Admitting: Gastroenterology

## 2016-01-02 DIAGNOSIS — R197 Diarrhea, unspecified: Secondary | ICD-10-CM

## 2016-01-02 NOTE — Telephone Encounter (Signed)
Pt states she has been having diarrhea off and on since January. States as soon as she eats it goes right through her, states she may have 15-20 stools/day and at times she cannot control them. Reports being on several antibiotics for bladder infections. Pt has been taking a probiotic but they have not helped. Pt thinks she may have cdiff. Dr. Loletha Carrow please advise.

## 2016-01-02 NOTE — Telephone Encounter (Signed)
Pt aware and will come to give specimen.

## 2016-01-02 NOTE — Telephone Encounter (Signed)
Called pt in regards to walk in form filled out on 01/01/16. Per MD pt should follow up with GI since GI prescribed the antibiotic. Transferred pt to GI.

## 2016-01-02 NOTE — Telephone Encounter (Signed)
Please order C diff stool study.  I think it is probably happening because of her chronically high glucose.  OTC imodium

## 2016-01-03 ENCOUNTER — Inpatient Hospital Stay (HOSPITAL_COMMUNITY)
Admission: EM | Admit: 2016-01-03 | Discharge: 2016-01-06 | DRG: 371 | Disposition: A | Discharge status: Home / Self Care | Payer: Medicare Other | Attending: Internal Medicine | Admitting: Internal Medicine

## 2016-01-03 ENCOUNTER — Telehealth: Payer: Self-pay | Admitting: Internal Medicine

## 2016-01-03 ENCOUNTER — Encounter (HOSPITAL_COMMUNITY): Payer: Self-pay | Admitting: Nurse Practitioner

## 2016-01-03 ENCOUNTER — Other Ambulatory Visit: Payer: Medicare Other

## 2016-01-03 DIAGNOSIS — G894 Chronic pain syndrome: Secondary | ICD-10-CM | POA: Diagnosis not present

## 2016-01-03 DIAGNOSIS — I110 Hypertensive heart disease with heart failure: Secondary | ICD-10-CM | POA: Diagnosis present

## 2016-01-03 DIAGNOSIS — A0471 Enterocolitis due to Clostridium difficile, recurrent: Secondary | ICD-10-CM

## 2016-01-03 DIAGNOSIS — J45909 Unspecified asthma, uncomplicated: Secondary | ICD-10-CM | POA: Diagnosis present

## 2016-01-03 DIAGNOSIS — Z9981 Dependence on supplemental oxygen: Secondary | ICD-10-CM

## 2016-01-03 DIAGNOSIS — J449 Chronic obstructive pulmonary disease, unspecified: Secondary | ICD-10-CM | POA: Diagnosis present

## 2016-01-03 DIAGNOSIS — E119 Type 2 diabetes mellitus without complications: Secondary | ICD-10-CM | POA: Diagnosis present

## 2016-01-03 DIAGNOSIS — Z791 Long term (current) use of non-steroidal anti-inflammatories (NSAID): Secondary | ICD-10-CM | POA: Diagnosis not present

## 2016-01-03 DIAGNOSIS — A047 Enterocolitis due to Clostridium difficile: Principal | ICD-10-CM | POA: Diagnosis present

## 2016-01-03 DIAGNOSIS — Z8541 Personal history of malignant neoplasm of cervix uteri: Secondary | ICD-10-CM

## 2016-01-03 DIAGNOSIS — F329 Major depressive disorder, single episode, unspecified: Secondary | ICD-10-CM | POA: Diagnosis present

## 2016-01-03 DIAGNOSIS — I5032 Chronic diastolic (congestive) heart failure: Secondary | ICD-10-CM

## 2016-01-03 DIAGNOSIS — Z96652 Presence of left artificial knee joint: Secondary | ICD-10-CM | POA: Diagnosis present

## 2016-01-03 DIAGNOSIS — I959 Hypotension, unspecified: Secondary | ICD-10-CM

## 2016-01-03 DIAGNOSIS — M353 Polymyalgia rheumatica: Secondary | ICD-10-CM | POA: Diagnosis present

## 2016-01-03 DIAGNOSIS — Z8673 Personal history of transient ischemic attack (TIA), and cerebral infarction without residual deficits: Secondary | ICD-10-CM

## 2016-01-03 DIAGNOSIS — Z87891 Personal history of nicotine dependence: Secondary | ICD-10-CM | POA: Diagnosis not present

## 2016-01-03 DIAGNOSIS — G4700 Insomnia, unspecified: Secondary | ICD-10-CM

## 2016-01-03 DIAGNOSIS — M419 Scoliosis, unspecified: Secondary | ICD-10-CM | POA: Diagnosis present

## 2016-01-03 DIAGNOSIS — E039 Hypothyroidism, unspecified: Secondary | ICD-10-CM | POA: Diagnosis present

## 2016-01-03 DIAGNOSIS — E785 Hyperlipidemia, unspecified: Secondary | ICD-10-CM | POA: Diagnosis present

## 2016-01-03 DIAGNOSIS — Z87440 Personal history of urinary (tract) infections: Secondary | ICD-10-CM

## 2016-01-03 DIAGNOSIS — Z79899 Other long term (current) drug therapy: Secondary | ICD-10-CM | POA: Diagnosis not present

## 2016-01-03 DIAGNOSIS — Z794 Long term (current) use of insulin: Secondary | ICD-10-CM

## 2016-01-03 DIAGNOSIS — N179 Acute kidney failure, unspecified: Secondary | ICD-10-CM | POA: Diagnosis not present

## 2016-01-03 DIAGNOSIS — M797 Fibromyalgia: Secondary | ICD-10-CM | POA: Diagnosis present

## 2016-01-03 DIAGNOSIS — M1990 Unspecified osteoarthritis, unspecified site: Secondary | ICD-10-CM | POA: Diagnosis present

## 2016-01-03 DIAGNOSIS — N170 Acute kidney failure with tubular necrosis: Secondary | ICD-10-CM | POA: Diagnosis present

## 2016-01-03 DIAGNOSIS — E860 Dehydration: Secondary | ICD-10-CM | POA: Diagnosis not present

## 2016-01-03 DIAGNOSIS — K760 Fatty (change of) liver, not elsewhere classified: Secondary | ICD-10-CM | POA: Diagnosis present

## 2016-01-03 DIAGNOSIS — Z85850 Personal history of malignant neoplasm of thyroid: Secondary | ICD-10-CM

## 2016-01-03 DIAGNOSIS — R197 Diarrhea, unspecified: Secondary | ICD-10-CM

## 2016-01-03 DIAGNOSIS — F419 Anxiety disorder, unspecified: Secondary | ICD-10-CM | POA: Diagnosis present

## 2016-01-03 DIAGNOSIS — E109 Type 1 diabetes mellitus without complications: Secondary | ICD-10-CM

## 2016-01-03 LAB — COMPREHENSIVE METABOLIC PANEL
ALK PHOS: 83 U/L (ref 38–126)
ALT: 12 U/L — ABNORMAL LOW (ref 14–54)
ANION GAP: 14 (ref 5–15)
AST: 15 U/L (ref 15–41)
Albumin: 3.4 g/dL — ABNORMAL LOW (ref 3.5–5.0)
BILIRUBIN TOTAL: 0.8 mg/dL (ref 0.3–1.2)
BUN: 38 mg/dL — ABNORMAL HIGH (ref 6–20)
CALCIUM: 8.7 mg/dL — AB (ref 8.9–10.3)
CO2: 21 mmol/L — ABNORMAL LOW (ref 22–32)
Chloride: 93 mmol/L — ABNORMAL LOW (ref 101–111)
Creatinine, Ser: 5.41 mg/dL — ABNORMAL HIGH (ref 0.44–1.00)
GFR calc non Af Amer: 8 mL/min — ABNORMAL LOW (ref >60)
GFR, EST AFRICAN AMERICAN: 9 mL/min — AB (ref >60)
Glucose, Bld: 363 mg/dL — ABNORMAL HIGH (ref 65–99)
Potassium: 4.6 mmol/L (ref 3.5–5.1)
Sodium: 128 mmol/L — ABNORMAL LOW (ref 135–145)
TOTAL PROTEIN: 7.7 g/dL (ref 6.5–8.1)

## 2016-01-03 LAB — URINALYSIS, ROUTINE W REFLEX MICROSCOPIC
BILIRUBIN URINE: NEGATIVE
Glucose, UA: 500 mg/dL — AB
Ketones, ur: NEGATIVE mg/dL
Leukocytes, UA: NEGATIVE
NITRITE: NEGATIVE
Protein, ur: 30 mg/dL — AB
SPECIFIC GRAVITY, URINE: 1.014 (ref 1.005–1.030)
pH: 5 (ref 5.0–8.0)

## 2016-01-03 LAB — MAGNESIUM: Magnesium: 1.6 mg/dL — ABNORMAL LOW (ref 1.7–2.4)

## 2016-01-03 LAB — C DIFFICILE QUICK SCREEN W PCR REFLEX
C DIFFICILE (CDIFF) INTERP: POSITIVE
C DIFFICLE (CDIFF) ANTIGEN: POSITIVE — AB
C Diff toxin: POSITIVE — AB

## 2016-01-03 LAB — GLUCOSE, CAPILLARY
GLUCOSE-CAPILLARY: 257 mg/dL — AB (ref 65–99)
GLUCOSE-CAPILLARY: 215 mg/dL — AB (ref 65–99)

## 2016-01-03 LAB — CBC
HCT: 37.2 % (ref 36.0–46.0)
HEMOGLOBIN: 11.5 g/dL — AB (ref 12.0–15.0)
MCH: 24.7 pg — ABNORMAL LOW (ref 26.0–34.0)
MCHC: 30.9 g/dL (ref 30.0–36.0)
MCV: 80 fL (ref 78.0–100.0)
Platelets: 221 10*3/uL (ref 150–400)
RBC: 4.65 MIL/uL (ref 3.87–5.11)
RDW: 15.1 % (ref 11.5–15.5)
WBC: 8.6 10*3/uL (ref 4.0–10.5)

## 2016-01-03 LAB — URINE MICROSCOPIC-ADD ON

## 2016-01-03 LAB — I-STAT CG4 LACTIC ACID, ED
LACTIC ACID, VENOUS: 1.25 mmol/L (ref 0.5–2.0)
LACTIC ACID, VENOUS: 0.99 mmol/L (ref 0.5–2.0)

## 2016-01-03 LAB — CREATININE, SERUM
Creatinine, Ser: 4.41 mg/dL — ABNORMAL HIGH (ref 0.44–1.00)
GFR, EST AFRICAN AMERICAN: 12 mL/min — AB (ref >60)
GFR, EST NON AFRICAN AMERICAN: 10 mL/min — AB (ref >60)

## 2016-01-03 LAB — PHOSPHORUS: PHOSPHORUS: 4.5 mg/dL (ref 2.5–4.6)

## 2016-01-03 LAB — CBG MONITORING, ED: GLUCOSE-CAPILLARY: 302 mg/dL — AB (ref 65–99)

## 2016-01-03 MED ORDER — VANCOMYCIN 50 MG/ML ORAL SOLUTION
125.0000 mg | Freq: Four times a day (QID) | ORAL | Status: DC
  Administered 2016-01-03 – 2016-01-06 (×12): 125 mg via ORAL
  Filled 2016-01-03 (×13): qty 2.5

## 2016-01-03 MED ORDER — INSULIN ASPART 100 UNIT/ML ~~LOC~~ SOLN
0.0000 [IU] | Freq: Three times a day (TID) | SUBCUTANEOUS | Status: DC
  Administered 2016-01-04: 4 [IU] via SUBCUTANEOUS
  Administered 2016-01-04: 7 [IU] via SUBCUTANEOUS
  Administered 2016-01-04: 4 [IU] via SUBCUTANEOUS
  Administered 2016-01-05: 7 [IU] via SUBCUTANEOUS
  Administered 2016-01-05 (×2): 4 [IU] via SUBCUTANEOUS

## 2016-01-03 MED ORDER — LORATADINE 10 MG PO TABS
10.0000 mg | ORAL_TABLET | Freq: Every day | ORAL | Status: DC
  Administered 2016-01-03 – 2016-01-06 (×4): 10 mg via ORAL
  Filled 2016-01-03 (×4): qty 1

## 2016-01-03 MED ORDER — SODIUM CHLORIDE 0.9 % IV BOLUS (SEPSIS)
1000.0000 mL | Freq: Once | INTRAVENOUS | Status: AC
  Administered 2016-01-03: 1000 mL via INTRAVENOUS

## 2016-01-03 MED ORDER — TIZANIDINE HCL 2 MG PO TABS
2.0000 mg | ORAL_TABLET | Freq: Three times a day (TID) | ORAL | Status: DC
  Administered 2016-01-04 – 2016-01-06 (×5): 2 mg via ORAL
  Filled 2016-01-03 (×10): qty 1

## 2016-01-03 MED ORDER — HEPARIN SODIUM (PORCINE) 5000 UNIT/ML IJ SOLN
5000.0000 [IU] | Freq: Three times a day (TID) | INTRAMUSCULAR | Status: DC
  Administered 2016-01-03 – 2016-01-06 (×9): 5000 [IU] via SUBCUTANEOUS
  Filled 2016-01-03 (×9): qty 1

## 2016-01-03 MED ORDER — SODIUM CHLORIDE 0.9 % IV SOLN
INTRAVENOUS | Status: DC
  Administered 2016-01-03 – 2016-01-05 (×3): via INTRAVENOUS

## 2016-01-03 MED ORDER — MELATONIN 5 MG PO CAPS: 5.0000 mg | ORAL_CAPSULE | Freq: Every evening | ORAL | Status: DC | PRN

## 2016-01-03 MED ORDER — MORPHINE SULFATE ER 30 MG PO TBCR
30.0000 mg | EXTENDED_RELEASE_TABLET | Freq: Two times a day (BID) | ORAL | Status: DC
  Administered 2016-01-04 – 2016-01-06 (×5): 30 mg via ORAL
  Filled 2016-01-03 (×6): qty 1

## 2016-01-03 MED ORDER — AMITRIPTYLINE HCL 25 MG PO TABS: 100.0000 mg | ORAL_TABLET | Freq: Every day | ORAL | Status: DC

## 2016-01-03 MED ORDER — ONDANSETRON HCL 4 MG/2ML IJ SOLN: 4.0000 mg | Freq: Four times a day (QID) | INTRAMUSCULAR | Status: DC | PRN

## 2016-01-03 MED ORDER — ACETAMINOPHEN 325 MG PO TABS
650.0000 mg | ORAL_TABLET | Freq: Once | ORAL | Status: AC
  Administered 2016-01-03: 650 mg via ORAL
  Filled 2016-01-03: qty 2

## 2016-01-03 MED ORDER — ONDANSETRON HCL 4 MG PO TABS: 8.0000 mg | ORAL_TABLET | Freq: Three times a day (TID) | ORAL | Status: DC | PRN

## 2016-01-03 MED ORDER — AMITRIPTYLINE HCL 25 MG PO TABS
100.0000 mg | ORAL_TABLET | Freq: Every day | ORAL | Status: DC
  Administered 2016-01-04 – 2016-01-06 (×3): 100 mg via ORAL
  Filled 2016-01-03 (×3): qty 4

## 2016-01-03 MED ORDER — INSULIN ASPART 100 UNIT/ML ~~LOC~~ SOLN
5.0000 [IU] | Freq: Once | SUBCUTANEOUS | Status: AC
  Administered 2016-01-03: 5 [IU] via SUBCUTANEOUS
  Filled 2016-01-03: qty 1

## 2016-01-03 MED ORDER — LEVOTHYROXINE SODIUM 150 MCG PO TABS
150.0000 ug | ORAL_TABLET | Freq: Every day | ORAL | Status: DC
  Administered 2016-01-04 – 2016-01-06 (×3): 150 ug via ORAL
  Filled 2016-01-03 (×3): qty 2
  Filled 2016-01-03 (×3): qty 1

## 2016-01-03 MED ORDER — POTASSIUM CHLORIDE CRYS ER 20 MEQ PO TBCR: 40.0000 meq | EXTENDED_RELEASE_TABLET | Freq: Every day | ORAL | Status: DC | PRN

## 2016-01-03 MED ORDER — HYDROMORPHONE HCL 2 MG PO TABS
4.0000 mg | ORAL_TABLET | Freq: Four times a day (QID) | ORAL | Status: DC | PRN
  Administered 2016-01-04 – 2016-01-06 (×2): 4 mg via ORAL
  Filled 2016-01-03 (×3): qty 2

## 2016-01-03 MED ORDER — METRONIDAZOLE IN NACL 5-0.79 MG/ML-% IV SOLN
500.0000 mg | Freq: Three times a day (TID) | INTRAVENOUS | Status: DC
  Administered 2016-01-03 – 2016-01-04 (×2): 500 mg via INTRAVENOUS
  Filled 2016-01-03 (×4): qty 100

## 2016-01-03 MED ORDER — ONDANSETRON HCL 4 MG PO TABS: 4.0000 mg | ORAL_TABLET | Freq: Four times a day (QID) | ORAL | Status: DC | PRN

## 2016-01-03 MED ORDER — IPRATROPIUM-ALBUTEROL 0.5-2.5 (3) MG/3ML IN SOLN: 3.0000 mL | Freq: Four times a day (QID) | RESPIRATORY_TRACT | Status: DC | PRN

## 2016-01-03 MED ORDER — INSULIN ASPART 100 UNIT/ML ~~LOC~~ SOLN
0.0000 [IU] | Freq: Every day | SUBCUTANEOUS | Status: DC
  Administered 2016-01-04: 2 [IU] via SUBCUTANEOUS

## 2016-01-03 NOTE — ED Notes (Signed)
Wheeled pt back to room from waiting room. 

## 2016-01-03 NOTE — Progress Notes (Addendum)
Pt arrived and oriented to unit. Pt on contact and enteric precautions for cdiff.

## 2016-01-03 NOTE — Progress Notes (Signed)
Entered in error

## 2016-01-03 NOTE — ED Notes (Signed)
Ashleigh, RN present when blood pressure was taken

## 2016-01-03 NOTE — H&P (Signed)
History and Physical    Mary Acosta [id number] DOB: September 20, 1955 DOA: 01/03/2016  Referring MD/NP/PA: Dr Christy Gentles - [facility] PCP: Binnie Rail, MD Patient coming from: Home   Chief Complaint: diarrhea  HPI: Mary Acosta is a 60 y.o. female with medical history significant of anxiety, insomnia, COPD, PMR, nonalcoholic fatty liver disease, scoliosis, TIA, HLD, chronic pain syndrome in the pain clinic at [facility], HTN, hypothyroid, DM, HA, fibromyalgia, thyroid cancer, cervical cancer, HCAP, presenting w/ persistent diarrhea, dehydration and concern for Cdiff.  Patient presenting with multiple intermittent bouts of diarrhea and recurrent UTIs over the last 4 months. Unclear whether or not patient was actually ever confirmed to have C. difficile but she received multiple rounds of Flagyl for this. Of note patient also received multiple rounds of ciprofloxacin for recurrent UTIs. Patient intermittently is diapered at home due to difficulty with ambulation and urinary frequency and urgency. Patient is put on metronidazole her diarrhea clears up for short period of time. Patient's most recent bout of symptoms started approximately 1-2 days after coming off of her Flagyl and ciprofloxacin. She was being treated with the super floxacillin for UTI. Patient has had 7-8 days of watery diarrhea. Proximally 4-5 minutes per day. Associated with crampy abdominal pain and nausea. Denies any fevers, chest pain, shortness of breath, palpitations, neck stiffness, headache, rash. Patient seen by her PCP one day ago where a stool sample was taken and sent for C. difficile. Results are not back yet. Patient was sent to emergency room today due to concern for persistent infection with systemic symptoms.  ED Course: Pt given 2L NS bolus w/ improvement in BP adn tylenol for pain.   Review of Systems: As per HPI otherwise 10 point review of systems negative.   Past Medical History  Diagnosis Date  .  Anxiety     with panic attacks  . Insomnia   . COPD (chronic obstructive pulmonary disease) ([place])   . Polymyalgia rheumatica ([place])   . Fatty liver disease, nonalcoholic   . Scoliosis   . Asthma   . Depression   . History of TIA (transient ischemic attack) 11-01-2010    NO RESIDUAL  . Hyperlipidemia   . Lumbar stenosis   . Chronic pain syndrome     [facility] [place]  . Seasonal allergies   . Chronic narcotic use   . Frequency of urination   . Urgency of urination   . Nocturia   . Vaginal pain S/P SLING  FEB 2012  . Hypertension   . OSA (obstructive sleep apnea)     NO CPAP SINCE WT LOSS  . Hypothyroidism   . IDDM (insulin dependent diabetes mellitus) ([place])   . Daily headache   . Osteoarthritis     with severe disease in knee  . DJD (degenerative joint disease)   . Arthritis     "back; feet; hands; shoulders" (08/26/2014)  . Fibromyalgia   . Chronic lower back pain   . Thyroid cancer ([place])   . Cervical cancer ([place])   . Pneumonia "several times"  . HCAP (healthcare-associated pneumonia) 08/26/2014  . Fatty liver disease, nonalcoholic     Past Surgical History  Procedure Laterality Date  . Appendectomy  1982  . Tubal ligation  1983  . Hysteroscopy w/d&c  08-19-2007    PMB  . Laparoscopic gastric banding  03/01/2006    TRUNCAL VAGOTOMY/ PLACEMENT OF VG BAND  . Knee arthroscopy w/ debridement Left 03/29/2006  INTERNAL DERANGEMENT/ SEVERE DJD/ MENISCUS TEARS  . Transvaginal subureteral tape/ sling  09-28-2010    MIXED URINARY INCONTINENCE  . Revision total knee arthroplasty Left 08-29-2008; 05/2009  . Total knee arthroplasty Left 01-23-2007    SEVERE DJD  . Total thyroidectomy  11-22-2005    BILATERAL THYROID NODULES-- PAPILLARY CARCINOMA (0.5CM)/ ADENOMATOID NODULES  . Breast lumpectomy Left 02-28-2005    ATYPICAL DUCTAL HYPERPLASIA  . Cardiac catheterization  09-04-2004    NORMAL CORONARY ANATOMY/ NORMAL LVF/ EF 60%  . Laparoscopic cholecystectomy   06-10-2002  . Tonsillectomy  1969  . Transthoracic echocardiogram  12-27-2010    LVSF NORMAL / EF [id number] GRADE I DIASTOLIC DYSFUNCTION/ MILD MITRAL REGURG. / MILDLY DILATED LEFT ATRIUM/ MILDY INCREASED SYSTOLIC PRESSURE OF PULMONARY ARTERIES  . Cardiovascular stress test  12-27-2010  DR [place]    ABNORMAL NUCLEAR STUDY W/ /MILD INFERIOR ISCHEMIA/ EF 69%/  CT HEART ANGIOGRAM ;  NO ACUTE FINDINGS  . Cystoscopy  05/18/2012    Procedure: CYSTOSCOPY;  Surgeon: Reece Packer, MD;  Location: [facility];  Service: Urology;  Laterality: N/A;  examination under anethesia  . Cryoablation  05/16/2003    w/LEEP FOR ABNORMAL PAP SMEAR     reports that she quit smoking about 5 years ago. Her smoking use included Cigarettes. She has a 97.5 pack-year smoking history. She has never used smokeless tobacco. She reports that she does not drink alcohol or use illicit drugs.  Allergies  Allergen Reactions  . Gabapentin Swelling    Swelling in legs  . Losartan Other (See Comments)    Myalgias and muscle cramping  . Oxycodone Itching  . Sulfa Antibiotics Nausea Only and Rash  . Sulfonamide Derivatives Nausea Only    Family History  Problem Relation Age of Onset  . Colon cancer Maternal Uncle     x 2  . Breast cancer Other     great aunts x 5  . Diabetes Mother   . Heart disease Father   . Heart disease Mother     Prior to Admission medications   Medication Sig Start Date End Date Taking? Authorizing Provider  acetaminophen (TYLENOL) 500 MG tablet Take 500 mg by mouth every 4 (four) hours as needed for moderate pain.   Yes Historical Provider, MD  albuterol (PROVENTIL HFA;VENTOLIN HFA) 108 (90 BASE) MCG/ACT inhaler Inhale 2 puffs into the lungs every 6 (six) hours as needed for wheezing or shortness of breath. 09/11/14  Yes Marianne L York, PA-C  amitriptyline (ELAVIL) 50 MG tablet Take 100 mg by mouth daily.  06/09/15  Yes Historical Provider, MD  cetirizine (ZYRTEC) 10 MG tablet  Take 10 mg by mouth daily as needed for allergies.    Yes Historical Provider, MD  HYDROmorphone (DILAUDID) 4 MG tablet Take 4 mg by mouth every 6 (six) hours as needed for moderate pain.  07/04/15  Yes Historical Provider, MD  Insulin NPH Human, Isophane, (RELION N [place]) Inject 40 Units into the skin 2 (two) times daily.   Yes Historical Provider, MD  Insulin Regular Human (RELION R IJ) Inject 10-40 Units as directed 3 (three) times daily with meals.   Yes Historical Provider, MD  ipratropium-albuterol (DUONEB) 0.5-2.5 (3) MG/3ML SOLN Take 3 mLs by nebulization 4 (four) times daily. Patient taking differently: Take 3 mLs by nebulization every 6 (six) hours as needed.  10/20/14  Yes Brand Males, MD  levothyroxine (SYNTHROID, LEVOTHROID) 150 MCG tablet Take 150 mcg by mouth daily before breakfast.  Yes Historical Provider, MD  Melatonin 5 MG CAPS Take by mouth.   Yes Historical Provider, MD  meloxicam (MOBIC) 7.5 MG tablet Take 7.5 mg by mouth daily as needed for pain.  06/30/15  Yes Historical Provider, MD  morphine (MS CONTIN) 30 MG 12 hr tablet Take 30 mg by mouth 2 (two) times daily. 09/12/15  Yes Historical Provider, MD  ondansetron (ZOFRAN) 8 MG tablet Take 1 tablet (8 mg total) by mouth every 8 (eight) hours as needed for nausea or vomiting. 09/21/15  Yes Lori P Hvozdovic, PA-C  polyethylene glycol (MIRALAX / GLYCOLAX) packet Take 17 g by mouth 2 (two) times daily as needed for moderate constipation. 08/29/14  Yes Bobby Rumpf York, PA-C  potassium chloride SA (K-DUR,KLOR-CON) 20 MEQ tablet Take 40 mEq by mouth daily as needed (for cramping).  05/30/14  Yes Historical Provider, MD  tiZANidine (ZANAFLEX) 2 MG tablet Take 2 mg by mouth 3 (three) times daily. 05/30/15  Yes Historical Provider, MD  valsartan-hydrochlorothiazide (DIOVAN-HCT) 160-25 MG tablet Take 1 tablet by mouth daily as needed.  06/27/15  Yes Historical Provider, MD    Physical Exam: Filed Vitals:   01/03/16 1530 01/03/16 1600  01/03/16 1645 01/03/16 1715  BP: 108/58 93/51 103/63 97/66  Pulse: 98 95 91 86  Temp:      TempSrc:      Resp: 16 16 16 16   Height:      Weight:      SpO2: 96% 97% 97% 96%      Constitutional: Laying in bed with mild discomfort   Filed Vitals:   01/03/16 1530 01/03/16 1600 01/03/16 1645 01/03/16 1715  BP: 108/58 93/51 103/63 97/66  Pulse: 98 95 91 86  Temp:      TempSrc:      Resp: 16 16 16 16   Height:      Weight:      SpO2: 96% 97% 97% 96%   Eyes:  PERRL, lids and conjunctivae normal ENMT: very dry mmPosterior pharynx clear of any exudate or lesions.  Neck:  normal, supple, no masses, no thyromegaly Respiratory:  clear to auscultation bilaterally, no wheezing, no crackles. Normal respiratory effort. No accessory muscle use.  Cardiovascular:  Regular rate and rhythm, no murmurs / rubs / gallops. No extremity edema. 2+ pedal pulses. No carotid bruits.  Abdomen: Hypoactive bowel sounds, very obese, normoactive bowel sounds, mild tender to palpation. Musculoskeletal:  no clubbing / cyanosis. No joint deformity upper and lower extremities. Good ROM, no contractures. Normal muscle tone.  Skin:  no rashes, lesions, ulcers. No induration Neurologic:  CN 2-12 grossly intact. Sensation intact, Strength 5/5 in all 4.  Psychiatric:  Normal judgment and insight. Alert and oriented x 3. Normal mood.    Labs on Admission: I have personally reviewed following labs and imaging studies  CBC:  Recent Labs Lab 01/03/16 1128  WBC 8.6  HGB 11.5*  HCT 37.2  MCV 80.0  PLT [id number]   Basic Metabolic Panel:  Recent Labs Lab 01/03/16 1128  NA 128*  K 4.6  CL 93*  CO2 21*  GLUCOSE 363*  BUN 38*  CREATININE 5.41*  CALCIUM 8.7*   GFR: Estimated Creatinine Clearance: 15.4 mL/min (by C-G formula based on Cr of 5.41). Liver Function Tests:  Recent Labs Lab 01/03/16 1128  AST 15  ALT 12*  ALKPHOS 83  BILITOT 0.8  PROT 7.7  ALBUMIN 3.4*   No results for input(s): LIPASE,  AMYLASE in the last 168 hours. No  results for input(s): AMMONIA in the last 168 hours. Coagulation Profile: No results for input(s): INR, PROTIME in the last 168 hours. Cardiac Enzymes: No results for input(s): CKTOTAL, CKMB, CKMBINDEX, TROPONINI in the last 168 hours. BNP (last 3 results) No results for input(s): PROBNP in the last 8760 hours. HbA1C: No results for input(s): HGBA1C in the last 72 hours. CBG:  Recent Labs Lab 01/03/16 1456  GLUCAP 302*   Lipid Profile: No results for input(s): CHOL, HDL, LDLCALC, TRIG, CHOLHDL, LDLDIRECT in the last 72 hours. Thyroid Function Tests: No results for input(s): TSH, T4TOTAL, FREET4, T3FREE, THYROIDAB in the last 72 hours. Anemia Panel: No results for input(s): VITAMINB12, FOLATE, FERRITIN, TIBC, IRON, RETICCTPCT in the last 72 hours. Urine analysis:    Component Value Date/Time   COLORURINE YELLOW 01/03/2016 1349   APPEARANCEUR CLOUDY* 01/03/2016 1349   LABSPEC 1.014 01/03/2016 1349   PHURINE 5.0 01/03/2016 1349   GLUCOSEU 500* 01/03/2016 1349   GLUCOSEU 250* 12/07/2015 1145   HGBUR MODERATE* 01/03/2016 1349   BILIRUBINUR NEGATIVE 01/03/2016 1349   KETONESUR NEGATIVE 01/03/2016 1349   PROTEINUR 30* 01/03/2016 1349   UROBILINOGEN 0.2 12/07/2015 1145   NITRITE NEGATIVE 01/03/2016 1349   LEUKOCYTESUR NEGATIVE 01/03/2016 1349    Creatinine Clearance: Estimated Creatinine Clearance: 15.4 mL/min (by C-G formula based on Cr of 5.41).  Sepsis Labs: @LABRCNTIP (procalcitonin:4,lacticidven:4) ) Recent Results (from the past 240 hour(s))  C difficile quick scan w PCR reflex     Status: Abnormal   Collection Time: 01/03/16  3:10 PM  Result Value Ref Range Status   C Diff antigen POSITIVE (A) NEGATIVE Final   C Diff toxin POSITIVE (A) NEGATIVE Final   C Diff interpretation Positive for toxigenic C. difficile  Final    Comment: CRITICAL RESULT CALLED TO, READ BACK BY AND VERIFIED WITHMindi Slicker RN 1642 01/03/16 A BROWNING       Radiological Exams on Admission: No results found.    Assessment/Plan Principal Problem:   Recurrent Clostridium difficile diarrhea Active Problems:   Chronic pain syndrome   COPD (chronic obstructive pulmonary disease) (HCC)   AKI (acute kidney injury) (HCC)   Chronic diastolic (congestive) heart failure (HCC)   Fatty liver   Diabetes mellitus with complication (HCC)   Hypotension   Insomnia   Recurrent diarrhea: Cdiff + on admission (toxin and Ag). Multiple bouts of diarrhea over the last 4 months. Intermittently clears with Flagyl. Patient also with multiple rounds of ciprofloxacin for UTIs over the same period of time. No confirmed diagnosis of C. difficile. Currently patient is afebrile but hypotensive with WBC 8.6, lactic acid 1.25, and without evidence of UTI.  - In patient - Vancomycin, Flagyl - Stop probiotic and PPI - IVF  [id number] poorly controlled.  - SSI - A1c  AKI: Cr 5.41. Baseline 1. Likely from #1. UA nml. Anticipate normalization after hydration - IVF - BMET    Fatty liver disease: recent Dx per pt. Undergoing outpt workup. Hepatic function nml - continue outpt  Chronic diatolic CHF: EF [id number] w/ grade 1 diastolic dysfunction. No evidence of acute compensation - Strict I's and O's, daily weights  Chronic pain/Fibromyalgia: - contine morphine, dilaudid, Zanaflex - Hold Mobic  Depression: - continue Elavil  Hypotension: Hypotension likely due to #1. Fluid responsive - hold valsartan, HCTZ - IVf  COPD/OHS: at baseline - continue O2 prn - continue albuterol prn  Insomnia: - continue melatonin   DVT prophylaxis: Hep  Code Status: FULL  Family Communication: husband  Disposition Plan: Pending improvement in condition. Anticipate multiple day admission  Consults called: none  Admission status: inpatient    Ledarius Leeson J MD [facility]  If 7PM-7AM, please contact night-coverage [email or web address] Password [facility]  01/03/2016, 5:36 PM

## 2016-01-03 NOTE — Care Management Note (Signed)
Case Management Note  Patient Details  Name: Sigrid Leap MRN: [id number] Date of Birth: 09-05-1955  Subjective/Objective:                  60 y.o. female with medical history significant of anxiety, insomnia, COPD, PMR, nonalcoholic fatty liver disease, scoliosis, TIA, HLD, chronic pain syndrome in the pain clinic at [facility], HTN, hypothyroid, DM, HA, fibromyalgia, thyroid cancer, cervical cancer, HCAP, presenting w/ persistent diarrhea, dehydration and concern for Cdiff. // Home with spouse.  Action/Plan: Follow for disposition needs.   Expected Discharge Date:       01/06/16           Expected Discharge Plan:  Home/Self Care  In-House Referral:  NA  Discharge planning Services  CM Consult  Post Acute Care Choice:    Choice offered to:     DME Arranged:    DME Agency:     HH Arranged:    HH Agency:     Status of Service:  In process, will continue to follow  Medicare Important Message Given:    Date Medicare IM Given:    Medicare IM give by:    Date Additional Medicare IM Given:    Additional Medicare Important Message give by:     If discussed at [place] of Stay Meetings, dates discussed:    Additional Comments:  Fuller Mandril, RN 01/03/2016, 3:08 PM

## 2016-01-03 NOTE — ED Notes (Signed)
Pt reports about 1.5 week history of diarrhea, poor appetite, nausea, fatigue. Onset of diarrhea after she finished 2 different abx for UTI and abd infection. She denies pain, vomiting, fevers. States her BP has been running low at home. She is alert and breathing easily. Her family took stool sample to PCP office this am.

## 2016-01-03 NOTE — Telephone Encounter (Signed)
Have her go to the ED for evaluation

## 2016-01-03 NOTE — Telephone Encounter (Signed)
Per Pts chart, she went to [facility]

## 2016-01-03 NOTE — Telephone Encounter (Signed)
Spouse states that he took a stool sample to the lab for patient.  States lab told him that it could be 3 days before they have results.  Spouse states he is going to take patient to [facility] because she has only ate a handful of food within the last week and she is having constant diahrrhea.  He is afraid patient might be dehydrated.

## 2016-01-03 NOTE — Telephone Encounter (Signed)
Please advise 

## 2016-01-03 NOTE — ED Provider Notes (Signed)
CSN: [id number]     Arrival date & time 01/03/16  1053 History   First MD Initiated Contact with Patient 01/03/16 1127     Chief Complaint  Patient presents with  . Diarrhea   Patient is a 60 y.o. female presenting with diarrhea. The history is provided by the patient.  Diarrhea Quality:  Watery Severity:  Severe Onset quality:  Gradual Number of episodes:  Multiple Duration:  7 days Timing:  Intermittent Progression:  Worsening Relieved by:  Nothing Worsened by:  Nothing tried Associated symptoms: abdominal pain and headaches   Risk factors: recent antibiotic use   Risk factors: no travel to endemic areas   Patient reports multiple episodes of diarrhea for past 7 days It is nonbloody No significant vomiting reported She reports abdominal discomfort She has been on antibiotics recently No travel This morning she slipped in shower and hit her head, no LOC, no vomiting but has headache   Past Medical History  Diagnosis Date  . Anxiety     with panic attacks  . Insomnia   . COPD (chronic obstructive pulmonary disease) ([place])   . Polymyalgia rheumatica ([place])   . Fatty liver disease, nonalcoholic   . Scoliosis   . Asthma   . Depression   . History of TIA (transient ischemic attack) 11-01-2010    NO RESIDUAL  . Hyperlipidemia   . Lumbar stenosis   . Chronic pain syndrome     [facility]  . Seasonal allergies   . Chronic narcotic use   . Frequency of urination   . Urgency of urination   . Nocturia   . Vaginal pain S/P SLING  FEB 2012  . Hypertension   . OSA (obstructive sleep apnea)     NO CPAP SINCE WT LOSS  . Hypothyroidism   . IDDM (insulin dependent diabetes mellitus) ([place])   . Daily headache   . Osteoarthritis     with severe disease in knee  . DJD (degenerative joint disease)   . Arthritis     "back; feet; hands; shoulders" (08/26/2014)  . Fibromyalgia   . Chronic lower back pain   . Thyroid cancer ([place])   . Cervical cancer ([place])   .  Pneumonia "several times"  . HCAP (healthcare-associated pneumonia) 08/26/2014   Past Surgical History  Procedure Laterality Date  . Appendectomy  1982  . Tubal ligation  1983  . Hysteroscopy w/d&c  08-19-2007    PMB  . Laparoscopic gastric banding  03/01/2006    TRUNCAL VAGOTOMY/ PLACEMENT OF VG BAND  . Knee arthroscopy w/ debridement Left 03/29/2006    INTERNAL DERANGEMENT/ SEVERE DJD/ MENISCUS TEARS  . Transvaginal subureteral tape/ sling  09-28-2010    MIXED URINARY INCONTINENCE  . Revision total knee arthroplasty Left 08-29-2008; 05/2009  . Total knee arthroplasty Left 01-23-2007    SEVERE DJD  . Total thyroidectomy  11-22-2005    BILATERAL THYROID NODULES-- PAPILLARY CARCINOMA (0.5CM)/ ADENOMATOID NODULES  . Breast lumpectomy Left 02-28-2005    ATYPICAL DUCTAL HYPERPLASIA  . Cardiac catheterization  09-04-2004    NORMAL CORONARY ANATOMY/ NORMAL LVF/ EF 60%  . Laparoscopic cholecystectomy  06-10-2002  . Tonsillectomy  1969  . Transthoracic echocardiogram  12-27-2010    LVSF NORMAL / EF [id number] GRADE I DIASTOLIC DYSFUNCTION/ MILD MITRAL REGURG. / MILDLY DILATED LEFT ATRIUM/ MILDY INCREASED SYSTOLIC PRESSURE OF PULMONARY ARTERIES  . Cardiovascular stress test  12-27-2010  DR [place]    ABNORMAL NUCLEAR STUDY W/ /MILD INFERIOR ISCHEMIA/  EF 69%/  CT HEART ANGIOGRAM ;  NO ACUTE FINDINGS  . Cystoscopy  05/18/2012    Procedure: CYSTOSCOPY;  Surgeon: Reece Packer, MD;  Location: [facility];  Service: Urology;  Laterality: N/A;  examination under anethesia  . Cryoablation  05/16/2003    w/LEEP FOR ABNORMAL PAP SMEAR   Family History  Problem Relation Age of Onset  . Colon cancer Maternal Uncle     x 2  . Breast cancer Other     great aunts x 5  . Diabetes Mother   . Heart disease Father   . Heart disease Mother    Social History  Substance Use Topics  . Smoking status: Former Smoker -- 2.50 packs/day for 39 years    Types: Cigarettes    Quit date:  10/22/2010  . Smokeless tobacco: Never Used  . Alcohol Use: No   OB History    No data available     Review of Systems  Constitutional: Positive for fatigue.  Gastrointestinal: Positive for nausea, abdominal pain and diarrhea. Negative for blood in stool.  Neurological: Positive for headaches.  All other systems reviewed and are negative.     Allergies  Gabapentin; Losartan; Oxycodone; Sulfa antibiotics; and Sulfonamide derivatives  Home Medications   Prior to Admission medications   Medication Sig Start Date End Date Taking? Authorizing Provider  acetaminophen (TYLENOL) 500 MG tablet Take 500 mg by mouth every 4 (four) hours as needed for moderate pain.    Historical Provider, MD  albuterol (PROVENTIL HFA;VENTOLIN HFA) 108 (90 BASE) MCG/ACT inhaler Inhale 2 puffs into the lungs every 6 (six) hours as needed for wheezing or shortness of breath. 09/11/14   Bobby Rumpf York, PA-C  amitriptyline (ELAVIL) 50 MG tablet Take 100 mg by mouth daily.  06/09/15   Historical Provider, MD  cetirizine (ZYRTEC) 10 MG tablet Take 10 mg by mouth daily as needed for allergies.     Historical Provider, MD  HYDROmorphone (DILAUDID) 4 MG tablet Take 4 mg by mouth every 6 (six) hours as needed for moderate pain.  07/04/15   Historical Provider, MD  Insulin NPH Human, Isophane, (RELION N ) Inject 40 Units into the skin 2 (two) times daily.    Historical Provider, MD  Insulin Regular Human (RELION R IJ) Inject 10-40 Units as directed 3 (three) times daily with meals.    Historical Provider, MD  ipratropium-albuterol (DUONEB) 0.5-2.5 (3) MG/3ML SOLN Take 3 mLs by nebulization 4 (four) times daily. Patient taking differently: Take 3 mLs by nebulization every 6 (six) hours as needed.  10/20/14   Brand Males, MD  levothyroxine (SYNTHROID, LEVOTHROID) 150 MCG tablet Take 150 mcg by mouth daily before breakfast.    Historical Provider, MD  Melatonin 5 MG CAPS Take by mouth.    Historical Provider, MD   meloxicam (MOBIC) 7.5 MG tablet Take 7.5 mg by mouth daily as needed for pain.  06/30/15   Historical Provider, MD  metroNIDAZOLE (FLAGYL) 500 MG tablet Take 1 tablet (500 mg total) by mouth 3 (three) times daily. 12/07/15   [name], MD  morphine (MS CONTIN) 30 MG 12 hr tablet Take 30 mg by mouth 2 (two) times daily. 09/12/15   Historical Provider, MD  nitrofurantoin, macrocrystal-monohydrate, (MACROBID) 100 MG capsule Take 1 capsule (100 mg total) by mouth 2 (two) times daily. 12/10/15   Binnie Rail, MD  ondansetron (ZOFRAN) 8 MG tablet Take 1 tablet (8 mg total) by mouth every 8 (  eight) hours as needed for nausea or vomiting. 09/21/15   Lori P Hvozdovic, PA-C  polyethylene glycol (MIRALAX / GLYCOLAX) packet Take 17 g by mouth 2 (two) times daily as needed for moderate constipation. 08/29/14   Bobby Rumpf York, PA-C  potassium chloride SA (K-DUR,KLOR-CON) 20 MEQ tablet Take 40 mEq by mouth daily as needed (for cramping).  05/30/14   Historical Provider, MD  tiZANidine (ZANAFLEX) 2 MG tablet Take 2 mg by mouth 3 (three) times daily. 05/30/15   Historical Provider, MD  valsartan-hydrochlorothiazide (DIOVAN-HCT) 160-25 MG tablet Take 1 tablet by mouth daily as needed.  06/27/15   Historical Provider, MD   BP 115/61 mmHg  Pulse 96  Temp(Src) 99.5 F (37.5 C) (Oral)  Resp 16  Ht 5\' 9"  (1.753 m)  Wt 118.389 kg  BMI 38.53 kg/m2  SpO2 96% Physical Exam CONSTITUTIONAL: Chronically ill appearing HEAD: mild tenderness to posterior occiput, no other signs of trauma EYES: EOMI/PERRL ENMT: Mucous membranes dry NECK: supple no meningeal signs SPINE/BACK:entire spine nontender CV: S1/S2 noted, no murmurs/rubs/gallops noted LUNGS: Lungs are clear to auscultation bilaterally, no apparent distress ABDOMEN: soft, nontender, no rebound or guarding, bowel sounds noted throughout abdomen, she is obese NEURO: Pt is awake/alert/appropriate, moves all extremitiesx4.  No facial droop.   EXTREMITIES: pulses  normal/equal, full ROM SKIN: warm, color normal PSYCH: no abnormalities of mood noted, alert and oriented to situation  ED Course  Procedures  Medications  acetaminophen (TYLENOL) tablet 650 mg (not administered)  sodium chloride 0.9 % bolus 1,000 mL (1,000 mLs Intravenous New Bag/Given 01/03/16 1150)  sodium chloride 0.9 % bolus 1,000 mL (1,000 mLs Intravenous New Bag/Given 01/03/16 1207)    Labs Review Labs Reviewed  COMPREHENSIVE METABOLIC PANEL - Abnormal; Notable for the following:    Sodium 128 (*)    Chloride 93 (*)    CO2 21 (*)    Glucose, Bld 363 (*)    BUN 38 (*)    Creatinine, Ser 5.41 (*)    Calcium 8.7 (*)    Albumin 3.4 (*)    ALT 12 (*)    GFR calc non Af Amer 8 (*)    GFR calc Af Amer 9 (*)    All other components within normal limits  CBC - Abnormal; Notable for the following:    Hemoglobin 11.5 (*)    MCH 24.7 (*)    All other components within normal limits  URINALYSIS, ROUTINE W REFLEX MICROSCOPIC (NOT AT [facility])  I-STAT CG4 LACTIC ACID, ED   I have personally reviewed and evaluated these lab results as part of my medical decision-making.  1:00 PM Pt with diarrhea for up to 7 days She is in acute renal failure She is dehydrated, IV fluids ordered Blood pressure improved Will need admission  cdif testing pending, as sent by PCP outpatient labs She will be placed on enteric precautions For HA - defer CT head for now, can monitor as inpatient (no LOC, not on anticoagulants) D/w dr Marily Memos for admission   MDM   Final diagnoses:  Diarrhea, unspecified type  Dehydration  AKI (acute kidney injury) [facility])    Nursing notes including past medical history and social history reviewed and considered in documentation Labs/vital reviewed myself and considered during evaluation      Ripley Fraise, MD 01/03/16 1302

## 2016-01-04 DIAGNOSIS — N179 Acute kidney failure, unspecified: Secondary | ICD-10-CM

## 2016-01-04 DIAGNOSIS — G894 Chronic pain syndrome: Secondary | ICD-10-CM

## 2016-01-04 DIAGNOSIS — I959 Hypotension, unspecified: Secondary | ICD-10-CM

## 2016-01-04 DIAGNOSIS — J449 Chronic obstructive pulmonary disease, unspecified: Secondary | ICD-10-CM

## 2016-01-04 DIAGNOSIS — E118 Type 2 diabetes mellitus with unspecified complications: Secondary | ICD-10-CM

## 2016-01-04 DIAGNOSIS — A047 Enterocolitis due to Clostridium difficile: Principal | ICD-10-CM

## 2016-01-04 DIAGNOSIS — I5032 Chronic diastolic (congestive) heart failure: Secondary | ICD-10-CM

## 2016-01-04 LAB — GLUCOSE, CAPILLARY
Glucose-Capillary: 215 mg/dL — ABNORMAL HIGH (ref 65–99)
Glucose-Capillary: 320 mg/dL — ABNORMAL HIGH (ref 65–99)
Glucose-Capillary: 160 mg/dL — ABNORMAL HIGH (ref 65–99)
Glucose-Capillary: 169 mg/dL — ABNORMAL HIGH (ref 65–99)
Glucose-Capillary: 168 mg/dL — ABNORMAL HIGH (ref 65–99)

## 2016-01-04 LAB — LACTIC ACID, PLASMA: LACTIC ACID, VENOUS: 0.6 mmol/L (ref 0.5–2.0)

## 2016-01-04 LAB — COMPREHENSIVE METABOLIC PANEL
ALT: 9 U/L — AB (ref 14–54)
AST: 13 U/L — AB (ref 15–41)
Albumin: 2.9 g/dL — ABNORMAL LOW (ref 3.5–5.0)
Alkaline Phosphatase: 77 U/L (ref 38–126)
Anion gap: 12 (ref 5–15)
BUN: 33 mg/dL — AB (ref 6–20)
CHLORIDE: 95 mmol/L — AB (ref 101–111)
CO2: 26 mmol/L (ref 22–32)
CREATININE: 3.85 mg/dL — AB (ref 0.44–1.00)
Calcium: 8.1 mg/dL — ABNORMAL LOW (ref 8.9–10.3)
GFR calc Af Amer: 14 mL/min — ABNORMAL LOW (ref >60)
GFR calc non Af Amer: 12 mL/min — ABNORMAL LOW (ref >60)
Glucose, Bld: 255 mg/dL — ABNORMAL HIGH (ref 65–99)
POTASSIUM: 4.8 mmol/L (ref 3.5–5.1)
SODIUM: 133 mmol/L — AB (ref 135–145)
Total Bilirubin: 0.6 mg/dL (ref 0.3–1.2)
Total Protein: 6.7 g/dL (ref 6.5–8.1)

## 2016-01-04 LAB — BASIC METABOLIC PANEL
ANION GAP: 10 (ref 5–15)
BUN: 31 mg/dL — AB (ref 6–20)
CO2: 26 mmol/L (ref 22–32)
CREATININE: 2.98 mg/dL — AB (ref 0.44–1.00)
Calcium: 8 mg/dL — ABNORMAL LOW (ref 8.9–10.3)
Chloride: 97 mmol/L — ABNORMAL LOW (ref 101–111)
GFR calc Af Amer: 19 mL/min — ABNORMAL LOW (ref >60)
GFR calc non Af Amer: 16 mL/min — ABNORMAL LOW (ref >60)
GLUCOSE: 176 mg/dL — AB (ref 65–99)
Potassium: 4.1 mmol/L (ref 3.5–5.1)
Sodium: 133 mmol/L — ABNORMAL LOW (ref 135–145)

## 2016-01-04 LAB — CLOSTRIDIUM DIFFICILE BY PCR: Toxigenic C. Difficile by PCR: DETECTED — CR

## 2016-01-04 LAB — CBC
HEMATOCRIT: 32.2 % — AB (ref 36.0–46.0)
Hemoglobin: 10.1 g/dL — ABNORMAL LOW (ref 12.0–15.0)
MCH: 25.4 pg — AB (ref 26.0–34.0)
MCHC: 31.4 g/dL (ref 30.0–36.0)
MCV: 80.9 fL (ref 78.0–100.0)
PLATELETS: 148 10*3/uL — AB (ref 150–400)
RBC: 3.98 MIL/uL (ref 3.87–5.11)
RDW: 15.1 % (ref 11.5–15.5)
WBC: 4.6 10*3/uL (ref 4.0–10.5)

## 2016-01-04 MED ORDER — INSULIN NPH (HUMAN) (ISOPHANE) 100 UNIT/ML ~~LOC~~ SUSP
20.0000 [IU] | Freq: Two times a day (BID) | SUBCUTANEOUS | Status: DC
  Administered 2016-01-05 – 2016-01-06 (×3): 20 [IU] via SUBCUTANEOUS
  Filled 2016-01-04: qty 10

## 2016-01-04 NOTE — Progress Notes (Signed)
[facility] Progress Note  Patient: Mary Acosta [id number]   PCP: Binnie Rail, MD DOB: [birth year]   DOA: 01/03/2016   DOS: 01/04/2016   Date of Service: the patient was seen and examined on 01/04/2016  Subjective: Patient continues to have diarrhea in the hospital but denies having any complaint of nausea or vomiting, some mild suprapubic pain. No chest pain or shortness of breath. No cough. She has chronic leg swelling which is unchanged at present Nutrition: Tolerating oral diet but worried about diarrhea  Brief hospital course: Patient was admitted on 01/03/2016, with complaint of diarrhea, was found to have acute kidney injury with C. difficile. Currently further plan is continue treating C. difficile with oral vancomycin.  Assessment and Plan: 1. Recurrent Clostridium difficile diarrhea Patient has multiple bouts of diarrhea improved with Flagyl as an outpatient. Also had multiple antibiotic course. Mild abdominal pain but no evidence of distention. Patient was admitted with vancomycin and Flagyl. At present I will continue only vancomycin. Stop PPI and probiotic. Patient was given aggressive IV hydration for acute kidney injury. We will continue IV fluids at present.  2. Acute kidney injury. Renal function significantly worsened from baseline. Patient has been taking daily Advil and Aleve as well. Improving with IV hydration. Likely ATN. With prerenal etiology. Continue IV fluids. Recheck BMP as well as lactic acid in the evening.  3. Chronic diastolic dysfunction. Monitor for volume overload while the patient is receiving IV fluids.  4. Chronic pain fibromyalgia. Continuing patient's home medication. Holding meloxicam.  5. COPD Continue home oxygen. Prn nebulization   6. DM -2 Continue sliding scale insulin Add low dose NPH.   Pain management: continuing home regimen, no indication for escalation at present Activity: physical therapy  consulted Bowel regimen: last BM 01/04/2016 Diet: Carb modified DVT Prophylaxis: subcutaneous Heparin  Advance goals of care discussion: Full code  Family Communication: no family was present at bedside, at the time of interview.   Disposition:  Discharge to likely home with maybe home health Expected discharge date: 01/07/2016  Consultants: None Procedures: None   Antibiotics: Anti-infectives    Start     Dose/Rate Route Frequency Ordered Stop   01/03/16 2000  vancomycin (VANCOCIN) 50 mg/mL oral solution 125 mg     125 mg Oral Every 6 hours 01/03/16 1733     01/03/16 1745  metroNIDAZOLE (FLAGYL) IVPB 500 mg  Status:  Discontinued     500 mg 100 mL/hr over 60 Minutes Intravenous Every 8 hours 01/03/16 1733 01/04/16 1128       No intake or output data in the 24 hours ending 01/04/16 1808 Filed Weights   01/03/16 1110  Weight: 118.389 kg (261 lb)    Objective: Physical Exam: Filed Vitals:   01/04/16 0817 01/04/16 1015 01/04/16 1450 01/04/16 1653  BP: 99/64 148/63 97/65 110/63  Pulse: 104 95 81   Temp: 98 F (36.7 C) 98.3 F (36.8 C) 98.2 F (36.8 C)   TempSrc: Oral Oral Oral   Resp: 16 17 21    Height:      Weight:      SpO2: 98% 95% 92%     General: Alert, Awake and Oriented to Time, Place and Person. Appear in mild distress Eyes: PERRL, Conjunctiva normal ENT: Oral Mucosa clear dry. Neck: no JVD, no Abnormal Mass Or lumps Cardiovascular: S1 and S2 Present, no  Murmur, Peripheral Pulses Present Respiratory: Bilateral Air entry equal and Decreased,  Clear to Auscultation, no Crackles, no wheezes  Abdomen: Bowel Sound present, Soft and mild tenderness Skin: redness no, no Rash  Extremities: trace Pedal edema, no calf tenderness Neurologic: Grossly no focal neuro deficit.Bilaterally Equal motor strength  Data Reviewed: CBC:  Recent Labs Lab 01/03/16 1128 01/04/16 0510  WBC 8.6 4.6  HGB 11.5* 10.1*  HCT 37.2 32.2*  MCV 80.0 80.9  PLT 221 148*    Basic Metabolic Panel:  Recent Labs Lab 01/03/16 1128 01/03/16 2127 01/04/16 0510  NA 128*  --  133*  K 4.6  --  4.8  CL 93*  --  95*  CO2 21*  --  26  GLUCOSE 363*  --  255*  BUN 38*  --  33*  CREATININE 5.41* 4.41* 3.85*  CALCIUM 8.7*  --  8.1*  MG  --  1.6*  --   PHOS  --  4.5  --     Liver Function Tests:  Recent Labs Lab 01/03/16 1128 01/04/16 0510  AST 15 13*  ALT 12* 9*  ALKPHOS 83 77  BILITOT 0.8 0.6  PROT 7.7 6.7  ALBUMIN 3.4* 2.9*   No results for input(s): LIPASE, AMYLASE in the last 168 hours. No results for input(s): AMMONIA in the last 168 hours. Coagulation Profile: No results for input(s): INR, PROTIME in the last 168 hours. Cardiac Enzymes: No results for input(s): CKTOTAL, CKMB, CKMBINDEX, TROPONINI in the last 168 hours. BNP (last 3 results) No results for input(s): PROBNP in the last 8760 hours.  CBG:  Recent Labs Lab 01/03/16 2209 01/04/16 0609 01/04/16 0927 01/04/16 1157 01/04/16 1640  GLUCAP 215* 215* 320* 160* 169*    Studies: No results found.   Scheduled Meds: . amitriptyline  100 mg Oral Daily  . heparin  5,000 Units Subcutaneous Q8H  . insulin aspart  0-20 Units Subcutaneous TID WC  . insulin aspart  0-5 Units Subcutaneous QHS  . insulin NPH Human  20 Units Subcutaneous BID AC & HS  . levothyroxine  150 mcg Oral QAC breakfast  . loratadine  10 mg Oral Daily  . morphine  30 mg Oral BID  . tiZANidine  2 mg Oral TID  . vancomycin  125 mg Oral Q6H   Continuous Infusions: . sodium chloride 75 mL/hr at 01/04/16 1129   PRN Meds: HYDROmorphone, ipratropium-albuterol, ondansetron **OR** ondansetron (ZOFRAN) IV, potassium chloride SA  Time spent: 30 minutes  Author: Berle Mull, MD [facility] Pager: [phone] 01/04/2016 6:08 PM  If 7PM-7AM, please contact night-coverage at [email or web address], password [facility]

## 2016-01-05 LAB — HEMOGLOBIN A1C
HEMOGLOBIN A1C: 11.4 % — AB (ref 4.8–5.6)
MEAN PLASMA GLUCOSE: 280 mg/dL

## 2016-01-05 LAB — COMPREHENSIVE METABOLIC PANEL
ALK PHOS: 73 U/L (ref 38–126)
ALT: 9 U/L — ABNORMAL LOW (ref 14–54)
AST: 11 U/L — AB (ref 15–41)
Albumin: 2.6 g/dL — ABNORMAL LOW (ref 3.5–5.0)
Anion gap: 10 (ref 5–15)
BILIRUBIN TOTAL: 0.4 mg/dL (ref 0.3–1.2)
BUN: 26 mg/dL — ABNORMAL HIGH (ref 6–20)
CALCIUM: 8.1 mg/dL — AB (ref 8.9–10.3)
CHLORIDE: 98 mmol/L — AB (ref 101–111)
CO2: 27 mmol/L (ref 22–32)
CREATININE: 2.37 mg/dL — AB (ref 0.44–1.00)
GFR, EST AFRICAN AMERICAN: 25 mL/min — AB (ref >60)
GFR, EST NON AFRICAN AMERICAN: 21 mL/min — AB (ref >60)
Glucose, Bld: 240 mg/dL — ABNORMAL HIGH (ref 65–99)
Potassium: 3.9 mmol/L (ref 3.5–5.1)
Sodium: 135 mmol/L (ref 135–145)
TOTAL PROTEIN: 6.6 g/dL (ref 6.5–8.1)

## 2016-01-05 LAB — GLUCOSE, CAPILLARY
GLUCOSE-CAPILLARY: 251 mg/dL — AB (ref 65–99)
GLUCOSE-CAPILLARY: 192 mg/dL — AB (ref 65–99)
GLUCOSE-CAPILLARY: 197 mg/dL — AB (ref 65–99)
GLUCOSE-CAPILLARY: 145 mg/dL — AB (ref 65–99)
Glucose-Capillary: 217 mg/dL — ABNORMAL HIGH (ref 65–99)

## 2016-01-05 LAB — CBC WITH DIFFERENTIAL/PLATELET
BASOS ABS: 0 10*3/uL (ref 0.0–0.1)
Basophils Relative: 0 %
Eosinophils Absolute: 0.2 10*3/uL (ref 0.0–0.7)
Eosinophils Relative: 4 %
HEMATOCRIT: 34 % — AB (ref 36.0–46.0)
HEMOGLOBIN: 10.3 g/dL — AB (ref 12.0–15.0)
LYMPHS PCT: 34 %
Lymphs Abs: 1.6 10*3/uL (ref 0.7–4.0)
MCH: 24.3 pg — ABNORMAL LOW (ref 26.0–34.0)
MCHC: 30.3 g/dL (ref 30.0–36.0)
MCV: 80.2 fL (ref 78.0–100.0)
MONO ABS: 0.6 10*3/uL (ref 0.1–1.0)
MONOS PCT: 13 %
NEUTROS ABS: 2.3 10*3/uL (ref 1.7–7.7)
NEUTROS PCT: 49 %
Platelets: 164 10*3/uL (ref 150–400)
RBC: 4.24 MIL/uL (ref 3.87–5.11)
RDW: 15 % (ref 11.5–15.5)
WBC: 4.6 10*3/uL (ref 4.0–10.5)

## 2016-01-05 LAB — MAGNESIUM: MAGNESIUM: 1.6 mg/dL — AB (ref 1.7–2.4)

## 2016-01-05 MED ORDER — MAGNESIUM SULFATE 2 GM/50ML IV SOLN
2.0000 g | Freq: Once | INTRAVENOUS | Status: AC
Start: 2016-01-05 — End: 2016-01-05
  Administered 2016-01-05: 2 g via INTRAVENOUS
  Filled 2016-01-05: qty 50

## 2016-01-05 NOTE — Progress Notes (Signed)
[facility] Progress Note  Patient: Mary Acosta [id number]   PCP: Binnie Rail, MD DOB: [birth year]   DOA: 01/03/2016   DOS: 01/05/2016   Date of Service: the patient was seen and examined on 01/05/2016  Subjective: Overnight continued to have diarrhea but during the day frequency appears to be getting better. Continues to have some mild abdominal pain. No nausea no vomiting. No fever no chills Nutrition: Tolerating oral diet but worried about diarrhea  Brief hospital course: Patient was admitted on 01/03/2016, with complaint of diarrhea, was found to have acute kidney injury with C. difficile. Currently further plan is continue treating C. difficile with oral vancomycin.  Assessment and Plan: 1. Recurrent Clostridium difficile diarrhea Patient has multiple bouts of diarrhea improved with Flagyl as an outpatient. Also had multiple antibiotic course. Mild abdominal pain but no evidence of distention. Patient was admitted with vancomycin and Flagyl. At present I will continue only vancomycin. Stop PPI and probiotic. Patient was given aggressive IV hydration for acute kidney injury. We will continue IV fluids at present.  2. Acute kidney injury. Renal function significantly worsened from baseline. Patient has been taking daily Advil and Aleve as well. Improving with IV hydration. Likely ATN. With prerenal etiology. Continue IV fluids.  3. Chronic diastolic dysfunction. Monitor for volume overload while the patient is receiving IV fluids.  4. Chronic pain fibromyalgia. Continuing patient's home medication. Holding meloxicam.  5. COPD Continue home oxygen. Prn nebulization   6. DM -2 Continue sliding scale insulin Add low dose NPH.   Pain management: continuing home regimen, no indication for escalation at present Activity: physical therapy consulted Bowel regimen: last BM 01/05/2016 Diet: Carb modified DVT Prophylaxis: subcutaneous Heparin  Advance  goals of care discussion: Full code  Family Communication: no family was present at bedside, at the time of interview.   Disposition:  Discharge to likely home with maybe home health Expected discharge date: 01/07/2016  Consultants: None Procedures: None   Antibiotics: Anti-infectives    Start     Dose/Rate Route Frequency Ordered Stop   01/03/16 2000  vancomycin (VANCOCIN) 50 mg/mL oral solution 125 mg     125 mg Oral Every 6 hours 01/03/16 1733     01/03/16 1745  metroNIDAZOLE (FLAGYL) IVPB 500 mg  Status:  Discontinued     500 mg 100 mL/hr over 60 Minutes Intravenous Every 8 hours 01/03/16 1733 01/04/16 1128        Intake/Output Summary (Last 24 hours) at 01/05/16 1844 Last data filed at 01/05/16 0755  Gross per 24 hour  Intake    120 ml  Output      0 ml  Net    120 ml   Filed Weights   01/03/16 1110 01/05/16 0500  Weight: 118.389 kg (261 lb) 122.471 kg (270 lb)    Objective: Physical Exam: Filed Vitals:   01/05/16 0537 01/05/16 1013 01/05/16 1416 01/05/16 1735  BP: 123/81 149/75 124/73 105/73  Pulse: 81 80 81 75  Temp: 98.6 F (37 C) 98.6 F (37 C) 97.9 F (36.6 C) 97.9 F (36.6 C)  TempSrc: Oral Oral Axillary Oral  Resp: 18 18 18 18   Height:      Weight:      SpO2: 100% 98% 90% 93%    General: Alert, Awake and Oriented to Time, Place and Person. Appear in no distress Eyes: PERRL, Conjunctiva normal ENT: Oral Mucosa clear dry. Neck: no JVD, no Abnormal Mass Or lumps Cardiovascular: S1 and S2  Present, no  Murmur, Peripheral Pulses Present Respiratory: Bilateral Air entry equal and Decreased,  Clear to Auscultation, no Crackles, no wheezes Abdomen: Bowel Sound present, Soft and mild tenderness Skin: redness no, no Rash  Extremities: trace Pedal edema, no calf tenderness  Data Reviewed: CBC:  Recent Labs Lab 01/03/16 1128 01/04/16 0510 01/05/16 0333  WBC 8.6 4.6 4.6  NEUTROABS  --   --  2.3  HGB 11.5* 10.1* 10.3*  HCT 37.2 32.2* 34.0*  MCV  80.0 80.9 80.2  PLT 221 148* [id number]   Basic Metabolic Panel:  Recent Labs Lab 01/03/16 1128 01/03/16 2127 01/04/16 0510 01/04/16 1928 01/05/16 0333  NA 128*  --  133* 133* 135  K 4.6  --  4.8 4.1 3.9  CL 93*  --  95* 97* 98*  CO2 21*  --  26 26 27   GLUCOSE 363*  --  255* 176* 240*  BUN 38*  --  33* 31* 26*  CREATININE 5.41* 4.41* 3.85* 2.98* 2.37*  CALCIUM 8.7*  --  8.1* 8.0* 8.1*  MG  --  1.6*  --   --  1.6*  PHOS  --  4.5  --   --   --     Liver Function Tests:  Recent Labs Lab 01/03/16 1128 01/04/16 0510 01/05/16 0333  AST 15 13* 11*  ALT 12* 9* 9*  ALKPHOS 83 77 73  BILITOT 0.8 0.6 0.4  PROT 7.7 6.7 6.6  ALBUMIN 3.4* 2.9* 2.6*   No results for input(s): LIPASE, AMYLASE in the last 168 hours. No results for input(s): AMMONIA in the last 168 hours. Coagulation Profile: No results for input(s): INR, PROTIME in the last 168 hours. Cardiac Enzymes: No results for input(s): CKTOTAL, CKMB, CKMBINDEX, TROPONINI in the last 168 hours. BNP (last 3 results) No results for input(s): PROBNP in the last 8760 hours.  CBG:  Recent Labs Lab 01/04/16 1640 01/04/16 2132 01/05/16 0351 01/05/16 0615 01/05/16 1129  GLUCAP 169* 168* 251* 217* 192*    Studies: No results found.   Scheduled Meds: . amitriptyline  100 mg Oral Daily  . heparin  5,000 Units Subcutaneous Q8H  . insulin aspart  0-20 Units Subcutaneous TID WC  . insulin aspart  0-5 Units Subcutaneous QHS  . insulin NPH Human  20 Units Subcutaneous BID AC & HS  . levothyroxine  150 mcg Oral QAC breakfast  . loratadine  10 mg Oral Daily  . morphine  30 mg Oral BID  . tiZANidine  2 mg Oral TID  . vancomycin  125 mg Oral Q6H   Continuous Infusions: . sodium chloride 75 mL/hr at 01/05/16 1126   PRN Meds: HYDROmorphone, ipratropium-albuterol, ondansetron **OR** ondansetron (ZOFRAN) IV, potassium chloride SA  Time spent: 30 minutes  Author: Berle Mull, MD [facility] Pager:  [phone] 01/05/2016 6:44 PM  If 7PM-7AM, please contact night-coverage at [email or web address], password [facility]

## 2016-01-05 NOTE — Care Management Note (Signed)
Case Management Note  Patient Details  Name: Mary Acosta MRN: [id number] Date of Birth: 04-03-1956  Subjective/Objective:                    Action/Plan: Pt continues treatment for Cdiff. CM following for discharge needs.   Expected Discharge Date:                  Expected Discharge Plan:  Home/Self Care  In-House Referral:  NA  Discharge planning Services  CM Consult  Post Acute Care Choice:    Choice offered to:     DME Arranged:    DME Agency:     HH Arranged:    HH Agency:     Status of Service:  In process, will continue to follow  Medicare Important Message Given:    Date Medicare IM Given:    Medicare IM give by:    Date Additional Medicare IM Given:    Additional Medicare Important Message give by:     If discussed at [place] of Stay Meetings, dates discussed:    Additional Comments:  Pollie Friar, RN 01/05/2016, 11:39 AM

## 2016-01-06 LAB — RENAL FUNCTION PANEL
Albumin: 2.6 g/dL — ABNORMAL LOW (ref 3.5–5.0)
Anion gap: 10 (ref 5–15)
BUN: 15 mg/dL (ref 6–20)
CHLORIDE: 99 mmol/L — AB (ref 101–111)
CO2: 27 mmol/L (ref 22–32)
CREATININE: 1.61 mg/dL — AB (ref 0.44–1.00)
Calcium: 8 mg/dL — ABNORMAL LOW (ref 8.9–10.3)
GFR, EST AFRICAN AMERICAN: 39 mL/min — AB (ref >60)
GFR, EST NON AFRICAN AMERICAN: 34 mL/min — AB (ref >60)
Glucose, Bld: 226 mg/dL — ABNORMAL HIGH (ref 65–99)
POTASSIUM: 3.9 mmol/L (ref 3.5–5.1)
Phosphorus: 2.4 mg/dL — ABNORMAL LOW (ref 2.5–4.6)
Sodium: 136 mmol/L (ref 135–145)

## 2016-01-06 LAB — GLUCOSE, CAPILLARY
GLUCOSE-CAPILLARY: 111 mg/dL — AB (ref 65–99)
GLUCOSE-CAPILLARY: 116 mg/dL — AB (ref 65–99)

## 2016-01-06 MED ORDER — VANCOMYCIN 50 MG/ML ORAL SOLUTION: 125.0000 mg | Freq: Four times a day (QID) | ORAL | Status: DC

## 2016-01-06 NOTE — Progress Notes (Signed)
Discharge instructions reviewed with patient and spouse. No further questions at this time.

## 2016-01-06 NOTE — Progress Notes (Signed)
Physical Therapy Evaluation & Discharge Patient Details Name: Mary Acosta MRN: [id number] DOB: 07-Mar-1956 Today's Date: 01/06/2016   History of Present Illness  60 yo female with recurrent C-diff and weakness.  Co-morbid conditions of COPD, DM, fibromyalgia, CHF  Clinical Impression  Patient presents with reported 'nearly normal' bowels again, denies significant pain or weakness.  Patient has assistive device equipment at home, is Independent without assistive device on evaluation, including Berg Balance score 52/56 (low fall risk).  Patient has support of spouse and family at home, is cleared from PT standpoint for safe mobility.  Communicated status to physician.  Will DC PT services.    Follow Up Recommendations No PT follow up    Equipment Recommendations  None recommended by PT    Recommendations for Other Services       Precautions / Restrictions Precautions Precautions: None Restrictions Weight Bearing Restrictions: No      Mobility  Bed Mobility Overal bed mobility: Independent                Transfers Overall transfer level: Independent Equipment used: None                Ambulation/Gait Ambulation/Gait assistance: Independent Ambulation Distance (Feet): 200 Feet Assistive device: None Gait Pattern/deviations: WFL(Within Functional Limits)   Gait velocity interpretation: at or above normal speed for age/gender    Stairs Stairs:  (Not tested due to C-diff, no anticipated impairment)          Wheelchair Mobility    Modified Rankin (Stroke Patients Only)       Balance Overall balance assessment: Independent;History of Falls                               Standardized Balance Assessment Standardized Balance Assessment : Berg Balance Test Berg Balance Test Sit to Stand: Able to stand without using hands and stabilize independently Standing Unsupported: Able to stand safely 2 minutes Sitting with Back  Unsupported but Feet Supported on Floor or Stool: Able to sit safely and securely 2 minutes Stand to Sit: Sits safely with minimal use of hands Transfers: Able to transfer safely, minor use of hands Standing Unsupported with Eyes Closed: Able to stand 10 seconds safely Standing Ubsupported with Feet Together: Able to place feet together independently and stand 1 minute safely From Standing, Reach Forward with Outstretched Arm: Can reach confidently >25 cm (10") From Standing Position, Pick up Object from Floor: Able to pick up shoe safely and easily From Standing Position, Turn to Look Behind Over each Shoulder: Looks behind one side only/other side shows less weight shift Turn 360 Degrees: Able to turn 360 degrees safely in 4 seconds or less Standing Unsupported, Alternately Place Feet on Step/Stool: Able to stand independently and complete 8 steps >20 seconds Standing Unsupported, One Foot in Front: Able to plae foot ahead of the other independently and hold 30 seconds Standing on One Leg: Able to lift leg independently and hold 5-10 seconds Total Score: 52         Pertinent Vitals/Pain Pain Assessment: No/denies pain    Home Living Family/patient expects to be discharged to:: Private residence Living Arrangements: Spouse/significant other Available Help at Discharge: Family Type of Home: House Home Access: Stairs to enter Entrance Stairs-Rails: Right Entrance Stairs-Number of Steps: 4 Home Layout: One level Home Equipment: Walker - 2 wheels      Prior Function Level of Independence: Independent  Hand Dominance        Extremity/Trunk Assessment   Upper Extremity Assessment: Overall WFL for tasks assessed           Lower Extremity Assessment: Overall WFL for tasks assessed         Communication   Communication: No difficulties  Cognition Arousal/Alertness: Awake/alert Behavior During Therapy: WFL for tasks assessed/performed Overall  Cognitive Status: Within Functional Limits for tasks assessed                      General Comments      Exercises        Assessment/Plan    PT Assessment Patent does not need any further PT services  PT Diagnosis Generalized weakness   PT Problem List    PT Treatment Interventions     PT Goals (Current goals can be found in the Care Plan section) Acute Rehab PT Goals Patient Stated Goal: go home    Frequency     Barriers to discharge        Co-evaluation               End of Session Equipment Utilized During Treatment: Gait belt Activity Tolerance: Patient tolerated treatment well Patient left: in bed;with family/visitor present;with call bell/phone within reach Nurse Communication: Mobility status         Time: 1415-1433 PT Time Calculation (min) (ACUTE ONLY): 18 min   Charges:   PT Evaluation $PT Eval Low Complexity: 1 Procedure     PT G CodesZenia Resides, Cartez Mogle L 01/19/16, 2:41 PM

## 2016-01-07 NOTE — Discharge Summary (Signed)
[facility] Discharge Summary   Patient: Mary Acosta [id number]   PCP: Binnie Rail, MD DOB: 06-Jun-1956   Date of admission: 01/03/2016   Date of discharge: 01/06/2016     Discharge Diagnoses:  Principal Problem:   Recurrent Clostridium difficile diarrhea Active Problems:   Chronic pain syndrome   COPD (chronic obstructive pulmonary disease) (HCC)   AKI (acute kidney injury) ([place])   Chronic diastolic (congestive) heart failure (HCC)   Fatty liver   Diabetes mellitus with complication (HCC)   Hypotension   Insomnia  Recommendations for Outpatient Follow-up:  1. Please follow up with PCP with [facility]   Follow-up Information    Follow up with Binnie Rail, MD. Schedule an appointment as soon as possible for a visit in 1 week.   Specialty:  Internal Medicine   Why:  with [facility]   Contact information:   [place] [address] [phone]      Diet recommendation: cardiac and diabetic diet  Activity: The patient is advised to gradually reintroduce usual activities.  Discharge Condition: good  History of present illness: As per the H and P dictated on admission, "Mary Acosta is a 60 y.o. female with medical history significant of anxiety, insomnia, COPD, PMR, nonalcoholic fatty liver disease, scoliosis, TIA, HLD, chronic pain syndrome in the pain clinic at [facility], HTN, hypothyroid, DM, HA, fibromyalgia, thyroid cancer, cervical cancer, HCAP, presenting w/ persistent diarrhea, dehydration and concern for Cdiff.  Patient presenting with multiple intermittent bouts of diarrhea and recurrent UTIs over the last 4 months. Unclear whether or not patient was actually ever confirmed to have C. difficile but she received multiple rounds of Flagyl for this. Of note patient also received multiple rounds of ciprofloxacin for recurrent UTIs. Patient intermittently is diapered at home due to difficulty with ambulation and urinary frequency and urgency.  Patient is put on metronidazole her diarrhea clears up for short period of time. Patient's most recent bout of symptoms started approximately 1-2 days after coming off of her Flagyl and ciprofloxacin. She was being treated with the super floxacillin for UTI. Patient has had 7-8 days of watery diarrhea. Proximally 4-5 minutes per day. Associated with crampy abdominal pain and nausea. Denies any fevers, chest pain, shortness of breath, palpitations, neck stiffness, headache, rash. Patient seen by her PCP one day ago where a stool sample was taken and sent for C. difficile. Results are not back yet. Patient was sent to emergency room today due to concern for persistent infection with systemic symptoms.  ED Course: Pt given 2L NS bolus w/ improvement in BP adn tylenol for pain. "  Hospital Course:  Summary of her active problems in the hospital is as following. 1. Recurrent Clostridium difficile diarrhea Patient has multiple bouts of diarrhea improved with Flagyl as an outpatient. Also had multiple antibiotic course. Mild abdominal pain but no evidence of distention. Patient was admitted with vancomycin and Flagyl. At present I will continue only vancomycin. Stop PPI and probiotic. Patient was given aggressive IV hydration for acute kidney injury.  2. Acute kidney injury. Renal function significantly worsened from baseline. Patient has been taking daily Advil and Aleve as well. Improving with IV hydration. Likely prerenal etiology. Recommend to hold the diovan HCTZ  3. Chronic diastolic dysfunction. Holding diovan HCTZ until seen by PCP.  4. Chronic pain fibromyalgia. Continuing patient's home medication. Resume meloxicam.  5. COPD Continue home oxygen. Prn nebulization   6. DM -2 Resume  home regimen.   All other chronic medical condition were stable during the hospitalization.  Patient was seen by physical therapy,and felt pt did not need any further therapy. On the day of the  discharge the patient's diarrhea resolved, and no other acute medical condition were reported by patient. the patient was felt safe to be discharge at home with family.  Procedures and Results:  none   Consultations:  none  DISCHARGE MEDICATION: Discharge Medication List as of 01/06/2016  3:42 PM    START taking these medications   Details  vancomycin (VANCOCIN) 50 mg/mL oral solution Take 2.5 mLs (125 mg total) by mouth every 6 (six) hours., Starting 01/06/2016, Until Wed 01/17/16, Print      CONTINUE these medications which have NOT CHANGED   Details  acetaminophen (TYLENOL) 500 MG tablet Take 500 mg by mouth every 4 (four) hours as needed for moderate pain., Until Discontinued, Historical Med    albuterol (PROVENTIL HFA;VENTOLIN HFA) 108 (90 BASE) MCG/ACT inhaler Inhale 2 puffs into the lungs every 6 (six) hours as needed for wheezing or shortness of breath., Starting 09/11/2014, Until Discontinued, Print    amitriptyline (ELAVIL) 50 MG tablet Take 100 mg by mouth daily. , Starting 06/09/2015, Until Discontinued, Historical Med    cetirizine (ZYRTEC) 10 MG tablet Take 10 mg by mouth daily as needed for allergies. , Until Discontinued, Historical Med    HYDROmorphone (DILAUDID) 4 MG tablet Take 4 mg by mouth every 6 (six) hours as needed for moderate pain. , Starting 07/04/2015, Until Discontinued, Historical Med    Insulin NPH Human, Isophane, (RELION N [place]) Inject 40 Units into the skin 2 (two) times daily., Until Discontinued, Historical Med    Insulin Regular Human (RELION R IJ) Inject 10-40 Units as directed 3 (three) times daily with meals., Until Discontinued, Historical Med    ipratropium-albuterol (DUONEB) 0.5-2.5 (3) MG/3ML SOLN Take 3 mLs by nebulization 4 (four) times daily., Starting 10/20/2014, Until Discontinued, Print    levothyroxine (SYNTHROID, LEVOTHROID) 150 MCG tablet Take 150 mcg by mouth daily before breakfast., Until Discontinued, Historical Med    Melatonin 5  MG CAPS Take 5 mg by mouth at bedtime as needed (sleep). , Until Discontinued, Historical Med    meloxicam (MOBIC) 7.5 MG tablet Take 7.5 mg by mouth daily as needed for pain. , Starting 06/30/2015, Until Discontinued, Historical Med    morphine (MS CONTIN) 30 MG 12 hr tablet Take 30 mg by mouth 2 (two) times daily., Starting 09/12/2015, Until Discontinued, Historical Med    ondansetron (ZOFRAN) 8 MG tablet Take 1 tablet (8 mg total) by mouth every 8 (eight) hours as needed for nausea or vomiting., Starting 09/21/2015, Until Discontinued, Normal    polyethylene glycol (MIRALAX / GLYCOLAX) packet Take 17 g by mouth 2 (two) times daily as needed for moderate constipation., Starting 08/29/2014, Until Discontinued, Print    potassium chloride SA (K-DUR,KLOR-CON) 20 MEQ tablet Take 40 mEq by mouth daily as needed (for cramping). , Starting 05/30/2014, Until Discontinued, Historical Med    tiZANidine (ZANAFLEX) 2 MG tablet Take 2 mg by mouth 3 (three) times daily., Starting 05/30/2015, Until Discontinued, Historical Med      STOP taking these medications     valsartan-hydrochlorothiazide (DIOVAN-HCT) 160-25 MG tablet        Allergies  Allergen Reactions  . Gabapentin Swelling    Swelling in legs  . Losartan Other (See Comments)    Myalgias and muscle cramping  . Oxycodone Itching  .  Sulfa Antibiotics Nausea Only and Rash  . Sulfonamide Derivatives Nausea Only    Discharge Exam: Filed Weights   01/03/16 1110 01/05/16 0500 01/06/16 0500  Weight: 118.389 kg (261 lb) 122.471 kg (270 lb) 123.106 kg (271 lb 6.4 oz)   Filed Vitals:   01/06/16 0502 01/06/16 1044  BP: 160/89 165/86  Pulse: 64 84  Temp: 98.2 F (36.8 C)   Resp: 18 18   General: Appear in no distress, no Rash; Oral Mucosa moist. Cardiovascular: S1 and S2 Present, no Murmur, no JVD Respiratory: Bilateral Air entry present and Clear to Auscultation, no Crackles, no wheezes Abdomen: Bowel Sound present, Soft and no  tenderness Extremities: no Pedal edema, no calf tenderness Neurology: Grossly no focal neuro deficit.  The results of significant diagnostics from this hospitalization (including imaging, microbiology, ancillary and laboratory) are listed below for reference.    Significant Diagnostic Studies: No results found.  Microbiology: Recent Results (from the past 240 hour(s))  Clostridium Difficile by PCR     Status: Abnormal   Collection Time: 01/03/16  9:46 AM  Result Value Ref Range Status   Toxigenic C Difficile by pcr Detected (AA) Not Detected Final    Comment: This test is for use only with liquid or soft stools; performance characteristics of other clinical specimen types have not been established.   This assay was performed by Cepheid GeneXpert(R) PCR. The performance characteristics of this assay have been determined by [employer]. Performance characteristics refer to the analytical performance of the test.   C difficile quick scan w PCR reflex     Status: Abnormal   Collection Time: 01/03/16  3:10 PM  Result Value Ref Range Status   C Diff antigen POSITIVE (A) NEGATIVE Final   C Diff toxin POSITIVE (A) NEGATIVE Final   C Diff interpretation Positive for toxigenic C. difficile  Final    Comment: CRITICAL RESULT CALLED TO, READ BACK BY AND VERIFIED WITH: B CHANDLER RN 1642 01/03/16 A BROWNING   Culture, blood (routine x 2)     Status: None (Preliminary result)   Collection Time: 01/03/16  9:27 PM  Result Value Ref Range Status   Specimen Description BLOOD RIGHT ANTECUBITAL  Final   Special Requests BOTTLES DRAWN AEROBIC AND ANAEROBIC 5CC  Final   Culture NO GROWTH 4 DAYS  Final   Report Status PENDING  Incomplete  Culture, blood (routine x 2)     Status: None (Preliminary result)   Collection Time: 01/03/16  9:36 PM  Result Value Ref Range Status   Specimen Description BLOOD LEFT ANTECUBITAL  Final   Special Requests BOTTLES DRAWN AEROBIC AND ANAEROBIC 5CC   Final   Culture NO GROWTH 4 DAYS  Final   Report Status PENDING  Incomplete     Labs: CBC:  Recent Labs Lab 01/03/16 1128 01/04/16 0510 01/05/16 0333  WBC 8.6 4.6 4.6  NEUTROABS  --   --  2.3  HGB 11.5* 10.1* 10.3*  HCT 37.2 32.2* 34.0*  MCV 80.0 80.9 80.2  PLT 221 148* [id number]   Basic Metabolic Panel:  Recent Labs Lab 01/03/16 1128 01/03/16 2127 01/04/16 0510 01/04/16 1928 01/05/16 0333 01/06/16 0311  NA 128*  --  133* 133* 135 136  K 4.6  --  4.8 4.1 3.9 3.9  CL 93*  --  95* 97* 98* 99*  CO2 21*  --  26 26 27 27   GLUCOSE 363*  --  255* 176* 240* 226*  BUN 38*  --  33* 31* 26* 15  CREATININE 5.41* 4.41* 3.85* 2.98* 2.37* 1.61*  CALCIUM 8.7*  --  8.1* 8.0* 8.1* 8.0*  MG  --  1.6*  --   --  1.6*  --   PHOS  --  4.5  --   --   --  2.4*   Liver Function Tests:  Recent Labs Lab 01/03/16 1128 01/04/16 0510 01/05/16 0333 01/06/16 0311  AST 15 13* 11*  --   ALT 12* 9* 9*  --   ALKPHOS 83 77 73  --   BILITOT 0.8 0.6 0.4  --   PROT 7.7 6.7 6.6  --   ALBUMIN 3.4* 2.9* 2.6* 2.6*   CBG:  Recent Labs Lab 01/05/16 1129 01/05/16 1625 01/05/16 2031 01/06/16 0616 01/06/16 1157  GLUCAP 192* 197* 145* 111* 116*   Time spent: 30 minutes  Signed:  Betsy Rosello  [facility] 01/06/2016 , 6:48 PM

## 2016-01-09 LAB — CULTURE, BLOOD (ROUTINE X 2)
CULTURE: NO GROWTH
Culture: NO GROWTH

## 2016-01-24 ENCOUNTER — Inpatient Hospital Stay: Payer: Medicare Other | Admitting: Internal Medicine

## 2016-01-29 ENCOUNTER — Ambulatory Visit (INDEPENDENT_AMBULATORY_CARE_PROVIDER_SITE_OTHER): Payer: Medicare Other | Admitting: Internal Medicine

## 2016-01-29 ENCOUNTER — Other Ambulatory Visit (INDEPENDENT_AMBULATORY_CARE_PROVIDER_SITE_OTHER): Payer: Medicare Other

## 2016-01-29 ENCOUNTER — Encounter: Payer: Self-pay | Admitting: Internal Medicine

## 2016-01-29 VITALS — BP 134/78 | HR 99 | Temp 98.7°F | Resp 18

## 2016-01-29 DIAGNOSIS — E038 Other specified hypothyroidism: Secondary | ICD-10-CM | POA: Diagnosis not present

## 2016-01-29 DIAGNOSIS — E139 Other specified diabetes mellitus without complications: Secondary | ICD-10-CM | POA: Diagnosis not present

## 2016-01-29 DIAGNOSIS — R197 Diarrhea, unspecified: Secondary | ICD-10-CM

## 2016-01-29 DIAGNOSIS — N179 Acute kidney failure, unspecified: Secondary | ICD-10-CM

## 2016-01-29 DIAGNOSIS — R300 Dysuria: Secondary | ICD-10-CM

## 2016-01-29 DIAGNOSIS — A0471 Enterocolitis due to Clostridium difficile, recurrent: Secondary | ICD-10-CM

## 2016-01-29 DIAGNOSIS — R221 Localized swelling, mass and lump, neck: Secondary | ICD-10-CM | POA: Insufficient documentation

## 2016-01-29 DIAGNOSIS — A047 Enterocolitis due to Clostridium difficile: Secondary | ICD-10-CM

## 2016-01-29 DIAGNOSIS — I10 Essential (primary) hypertension: Secondary | ICD-10-CM

## 2016-01-29 LAB — MAGNESIUM: MAGNESIUM: 1.6 mg/dL (ref 1.5–2.5)

## 2016-01-29 LAB — COMPREHENSIVE METABOLIC PANEL
ALK PHOS: 100 U/L (ref 39–117)
ALT: 11 U/L (ref 0–35)
AST: 19 U/L (ref 0–37)
Albumin: 3.6 g/dL (ref 3.5–5.2)
BUN: 19 mg/dL (ref 6–23)
CHLORIDE: 99 meq/L (ref 96–112)
CO2: 27 mEq/L (ref 19–32)
Calcium: 8.9 mg/dL (ref 8.4–10.5)
Creatinine, Ser: 0.91 mg/dL (ref 0.40–1.20)
GFR: 67.11 mL/min (ref >60.00)
GLUCOSE: 333 mg/dL — AB (ref 70–99)
POTASSIUM: 3.5 meq/L (ref 3.5–5.1)
Sodium: 134 mEq/L — ABNORMAL LOW (ref 135–145)
TOTAL PROTEIN: 7.4 g/dL (ref 6.0–8.3)
Total Bilirubin: 0.5 mg/dL (ref 0.2–1.2)

## 2016-01-29 LAB — CBC WITH DIFFERENTIAL/PLATELET
BASOS PCT: 0.8 % (ref 0.0–3.0)
Basophils Absolute: 0 10*3/uL (ref 0.0–0.1)
EOS PCT: 6.6 % — AB (ref 0.0–5.0)
Eosinophils Absolute: 0.4 10*3/uL (ref 0.0–0.7)
HCT: 36.5 % (ref 36.0–46.0)
Hemoglobin: 11.9 g/dL — ABNORMAL LOW (ref 12.0–15.0)
LYMPHS ABS: 2 10*3/uL (ref 0.7–4.0)
Lymphocytes Relative: 34.3 % (ref 12.0–46.0)
MCHC: 32.5 g/dL (ref 30.0–36.0)
MCV: 77.3 fl — ABNORMAL LOW (ref 78.0–100.0)
MONO ABS: 0.6 10*3/uL (ref 0.1–1.0)
Monocytes Relative: 10.3 % (ref 3.0–12.0)
NEUTROS PCT: 48 % (ref 43.0–77.0)
Neutro Abs: 2.8 10*3/uL (ref 1.4–7.7)
PLATELETS: 208 10*3/uL (ref 150.0–400.0)
RBC: 4.72 Mil/uL (ref 3.87–5.11)
RDW: 16.3 % — AB (ref 11.5–15.5)
WBC: 5.8 10*3/uL (ref 4.0–10.5)

## 2016-01-29 LAB — HEMOGLOBIN A1C: HEMOGLOBIN A1C: 9.7 % — AB (ref 4.6–6.5)

## 2016-01-29 LAB — TSH: TSH: 1.12 u[IU]/mL (ref 0.35–4.50)

## 2016-01-29 MED ORDER — INSULIN NPH (HUMAN) (ISOPHANE) 100 UNIT/ML ~~LOC~~ SUSP: 40.0000 [IU] | Freq: Two times a day (BID) | SUBCUTANEOUS | Status: DC

## 2016-01-29 MED ORDER — "INSULIN SYRINGE 30G X 5/16"" 1 ML MISC": Status: DC

## 2016-01-29 MED ORDER — INSULIN REGULAR HUMAN 100 UNIT/ML IJ SOLN: 10.0000 [IU] | Freq: Three times a day (TID) | INTRAMUSCULAR | Status: DC

## 2016-01-29 NOTE — Progress Notes (Signed)
Pre visit review using our clinic review tool, if applicable. No additional management support is needed unless otherwise documented below in the visit note. 

## 2016-01-29 NOTE — Patient Instructions (Signed)
  Test(s) ordered today. Your results will be released to [place] (or called to you) after review, usually within 72hours after test completion. If any changes need to be made, you will be notified at that same time.   Medications reviewed and updated.  No changes recommended at this time.  Your prescription(s) have been submitted to your pharmacy. Please take as directed and contact our office if you believe you are having problem(s) with the medication(s).  A referral was ordered for endocrine.   Please followup in 2 months

## 2016-01-29 NOTE — Assessment & Plan Note (Signed)
Left side - tender to touch Will check [place] given h/o thyroid cancer

## 2016-01-29 NOTE — Assessment & Plan Note (Signed)
BP well controlled Current regimen effective and well tolerated Continue current medications at current doses cmp -- may need to hold diovan hctz depending on diarrhea/dehydration/BP

## 2016-01-29 NOTE — Assessment & Plan Note (Signed)
Check tsh  Titrate med dose if needed Refer to endo

## 2016-01-29 NOTE — Assessment & Plan Note (Signed)
Ua, ucx

## 2016-01-29 NOTE — Assessment & Plan Note (Signed)
Was following with endo but wants to see a new one - will refer Check a1c Renewed insulin

## 2016-01-29 NOTE — Progress Notes (Signed)
Subjective:    Patient ID: Mary Acosta, female    DOB: 1956-08-14, 60 y.o.   MRN: [id number]  HPI She is here for follow up from the hospital.    In 07/2015 she was sick and in the hospital and two weeks later she got pneumonia, hospital related.  A couple of weeks later she got sick again and she was treated with prednisone.  She has had UTI's.  In January she started having stomach symptoms.  She had a Ct scan and [place] and that is when she was told she had cirrhosis secondary to fatty liver.    She was in the hospital 01/03/16 - 01/06/16 for C diff.  She had diarrhea before she was hospitalized and was treated empirically for cdiff or small bowel bacterial overgrowth.  In the hospital she was given flagyl and vanco.  She was discharged on vancomycin and she finished that.  She did received aggressive IVF while in the hospital for AKI.  Her bowels became formed by the time she finished the vanco.  Since completing the antibiotics her stools have become watery.  Yesterday she had 8-9 watery stools.  She denies fever or blood in the stool.  She has a mild twinge of pain below her navel.  She is drinking a lot of fluids. She does monitor her BP at home.  Her BP has been controlled.    Diabetes: She is taking her medication daily as prescribed. She is compliant with a diabetic diet. She is not exercising regularly. She monitors her sugars and they have been running > 200. She does not want to go back to her old endocrinologist but is willing to see a new one.    Hypothyroidism: she is taking her medication daily.  She thinks she needs a higher dose of her medication.  She hit her head the other day and has a bump on her head.  There was no LOC.      Medications and allergies reviewed with patient and updated if appropriate.  Patient Active Problem List   Diagnosis Date Noted  . Chronic diastolic (congestive) heart failure ([place]) 01/03/2016  . Fatty liver 01/03/2016  . Diabetes  mellitus with complication ([place]) 0000000  . Recurrent Clostridium difficile diarrhea 01/03/2016  . Hypotension 01/03/2016  . Insomnia 01/03/2016  . Liver cirrhosis ([place]) 12/07/2015  . Chronic respiratory failure ([place]) 11/10/2014  . Physical deconditioning 09/26/2014  . COLD (chronic obstructive lung disease) ([place]) 09/26/2014  . Dyspnea 09/26/2014  . Shortness of breath 09/08/2014  . AKI (acute kidney injury) ([place]) 09/08/2014  . HCAP (healthcare-associated pneumonia) 08/26/2014  . Pedal edema 08/26/2014  . COPD (chronic obstructive pulmonary disease) ([place]) 08/26/2014  . Heme positive stool 04/05/2011  . Other general symptoms  04/05/2011  . Iron deficiency anemia, unspecified  04/05/2011  . Bariatric surgery status 04/05/2011  . Diarrhea 03/04/2011  . Left arm pain   . UNSPECIFIED VITAMIN D DEFICIENCY 10/22/2007  . LOW BACK PAIN, CHRONIC 10/22/2007  . INSOMNIA 10/22/2007  . OBESITY 10/14/2007  . Chronic pain syndrome 10/14/2007  . CARPAL TUNNEL SYNDROME 10/14/2007  . PERIPHERAL NEUROPATHY 10/14/2007  . ALLERGIC RHINITIS CAUSE UNSPECIFIED 10/14/2007  . ARTHRITIS 10/14/2007  . FATTY LIVER DISEASE, HX OF 10/14/2007  . Hypothyroid 10/14/2007  . Diabetes 1.5, managed as type 2 ([place]) 09/14/2007  . HLD (hyperlipidemia) 09/14/2007  . Anxiety state 08/25/2007  . DEPRESSION 08/25/2007  . Essential hypertension 08/25/2007  . ASTHMA 08/25/2007  . CONSTIPATION  08/25/2007  . POLYMYALGIA RHEUMATICA 08/25/2007  . FOOT PAIN, CHRONIC 08/25/2007  . LEG EDEMA, BILATERAL 08/25/2007    Current Outpatient Prescriptions on File Prior to Visit  Medication Sig Dispense Refill  . acetaminophen (TYLENOL) 500 MG tablet Take 500 mg by mouth every 4 (four) hours as needed for moderate pain.    Marland Kitchen albuterol (PROVENTIL HFA;VENTOLIN HFA) 108 (90 BASE) MCG/ACT inhaler Inhale 2 puffs into the lungs every 6 (six) hours as needed for wheezing or shortness of breath. 1 Inhaler 2  . amitriptyline (ELAVIL) 50  MG tablet Take 100 mg by mouth daily.   5  . cetirizine (ZYRTEC) 10 MG tablet Take 10 mg by mouth daily as needed for allergies.     Marland Kitchen HYDROmorphone (DILAUDID) 4 MG tablet Take 4 mg by mouth every 6 (six) hours as needed for moderate pain.   0  . Insulin NPH Human, [place], (RELION N [place]) Inject 40 Units into the skin 2 (two) times daily.    . Insulin Regular Human (RELION R IJ) Inject 10-40 Units as directed 3 (three) times daily with meals.    Marland Kitchen ipratropium-albuterol (DUONEB) 0.5-2.5 (3) MG/3ML SOLN Take 3 mLs by nebulization 4 (four) times daily. (Patient taking differently: Take 3 mLs by nebulization every 6 (six) hours as needed. ) 360 mL 11  . levothyroxine (SYNTHROID, LEVOTHROID) 150 MCG tablet Take 150 mcg by mouth daily before breakfast.    . Melatonin 5 MG CAPS Take 5 mg by mouth at bedtime as needed (sleep).     . meloxicam (MOBIC) 7.5 MG tablet Take 7.5 mg by mouth daily as needed for pain.   5  . morphine (MS CONTIN) 30 MG 12 hr tablet Take 30 mg by mouth 2 (two) times daily.    . ondansetron (ZOFRAN) 8 MG tablet Take 1 tablet (8 mg total) by mouth every 8 (eight) hours as needed for nausea or vomiting. 60 tablet 1  . polyethylene glycol (MIRALAX / GLYCOLAX) packet Take 17 g by mouth 2 (two) times daily as needed for moderate constipation. 14 each 0  . potassium chloride SA (K-DUR,KLOR-CON) 20 MEQ tablet Take 40 mEq by mouth daily as needed (for cramping).   1  . tiZANidine (ZANAFLEX) 2 MG tablet Take 2 mg by mouth 3 (three) times daily.  2   No current facility-administered medications on file prior to visit.    Past Medical History  Diagnosis Date  . Anxiety     with panic attacks  . Insomnia   . COPD (chronic obstructive pulmonary disease) ([place])   . Polymyalgia rheumatica ([place])   . Fatty liver disease, nonalcoholic   . Scoliosis   . Asthma   . Depression   . History of TIA (transient ischemic attack) 11-01-2010    NO RESIDUAL  . Hyperlipidemia   . Lumbar stenosis   .  Chronic pain syndrome     [facility]  . Seasonal allergies   . Chronic narcotic use   . Frequency of urination   . Urgency of urination   . Nocturia   . Vaginal pain S/P SLING  FEB 2012  . Hypertension   . OSA (obstructive sleep apnea)     NO CPAP SINCE WT LOSS  . Hypothyroidism   . IDDM (insulin dependent diabetes mellitus) ([place])   . Daily headache   . Osteoarthritis     with severe disease in knee  . DJD (degenerative joint disease)   . Arthritis     "  back; feet; hands; shoulders" (08/26/2014)  . Fibromyalgia   . Chronic lower back pain   . Thyroid cancer ([place])   . Cervical cancer ([place])   . Pneumonia "several times"  . HCAP (healthcare-associated pneumonia) 08/26/2014  . Fatty liver disease, nonalcoholic     Past Surgical History  Procedure Laterality Date  . Appendectomy  1982  . Tubal ligation  1983  . Hysteroscopy w/d&c  08-19-2007    PMB  . Laparoscopic gastric banding  03/01/2006    TRUNCAL VAGOTOMY/ PLACEMENT OF VG BAND  . Knee arthroscopy w/ debridement Left 03/29/2006    INTERNAL DERANGEMENT/ SEVERE DJD/ MENISCUS TEARS  . Transvaginal subureteral tape/ sling  09-28-2010    MIXED URINARY INCONTINENCE  . Revision total knee arthroplasty Left 08-29-2008; 05/2009  . Total knee arthroplasty Left 01-23-2007    SEVERE DJD  . Total thyroidectomy  11-22-2005    BILATERAL THYROID NODULES-- PAPILLARY CARCINOMA (0.5CM)/ ADENOMATOID NODULES  . Breast lumpectomy Left 02-28-2005    ATYPICAL DUCTAL HYPERPLASIA  . Cardiac catheterization  09-04-2004    NORMAL CORONARY ANATOMY/ NORMAL LVF/ EF 60%  . Laparoscopic cholecystectomy  06-10-2002  . Tonsillectomy  1969  . Transthoracic echocardiogram  12-27-2010    LVSF NORMAL / EF [id number] GRADE I DIASTOLIC DYSFUNCTION/ MILD MITRAL REGURG. / MILDLY DILATED LEFT ATRIUM/ MILDY INCREASED SYSTOLIC PRESSURE OF PULMONARY ARTERIES  . Cardiovascular stress test  12-27-2010  DR [place]    ABNORMAL NUCLEAR STUDY W/ /MILD  INFERIOR ISCHEMIA/ EF 69%/  CT HEART ANGIOGRAM ;  NO ACUTE FINDINGS  . Cystoscopy  05/18/2012    Procedure: CYSTOSCOPY;  Surgeon: Reece Packer, MD;  Location: [facility];  Service: Urology;  Laterality: N/A;  examination under anethesia  . Cryoablation  05/16/2003    w/LEEP FOR ABNORMAL PAP SMEAR    Social History   Social History  . Marital Status: Married    Spouse Name: N/A  . Number of Children: 2  . Years of Education: N/A   Occupational History  . disabled    Social History Main Topics  . Smoking status: Former Smoker -- 2.50 packs/day for 39 years    Types: Cigarettes    Quit date: 10/22/2010  . Smokeless tobacco: Never Used  . Alcohol Use: No  . Drug Use: No  . Sexual Activity: Not Currently   Other Topics Concern  . Not on file   Social History Narrative    Family History  Problem Relation Age of Onset  . Colon cancer Maternal Uncle     x 2  . Breast cancer Other     great aunts x 5  . Diabetes Mother   . Heart disease Father   . Heart disease Mother     Review of Systems  Constitutional: Negative for fever and appetite change.  HENT:       Painful lump left neck  Respiratory: Negative for cough, shortness of breath and wheezing.   Cardiovascular: Positive for chest pain, palpitations and leg swelling.  Gastrointestinal: Positive for nausea, abdominal pain (mild - below navel) and diarrhea. Negative for blood in stool.  Genitourinary: Positive for dysuria.  Neurological: Positive for light-headedness (if bends over fast) and headaches.       Objective:   Filed Vitals:   01/29/16 1424  BP: 134/78  Pulse: 99  Temp: 98.7 F (37.1 C)  Resp: 18   There were no vitals filed for this visit. There is no weight on file to calculate BMI.  Physical Exam Constitutional: Appears well-developed and well-nourished. No distress.  Neck: scab on posterior upper head - no swelling, mildly tender, no bleeding or pus,surrounding hair is  missing.  Neck supple. No tracheal deviation present. No thyromegaly present. Painful lump left side of neck No carotid bruit. No cervical adenopathy.   Cardiovascular: Normal rate, regular rhythm and normal heart sounds.   No murmur heard.  No edema Pulmonary/Chest: Effort normal and breath sounds normal. No respiratory distress. No wheezes.  Abdomen: soft, obese, mild tenderness in epigastric region w/o rebound or guarding -- took a pain pill prior to appt so admits this may have decreased any pain she may have had. superificial lump in upper abdomen - probable lipoma       Assessment & Plan:   See Problem List for Assessment and Plan of chronic medical problems.

## 2016-01-29 NOTE — Assessment & Plan Note (Signed)
Diarrhea has returned - has good fluid intake, but increased risk of AKI/dehydration cmp today - will monitor closely  May need to hold diovan hctz

## 2016-01-29 NOTE — Assessment & Plan Note (Signed)
Treated for cdiff recently - diarrhea has returned Will check stool samples Will need to restart vanco if positive Check cmp

## 2016-01-30 ENCOUNTER — Other Ambulatory Visit: Payer: Medicare Other

## 2016-01-30 DIAGNOSIS — R300 Dysuria: Secondary | ICD-10-CM

## 2016-01-30 DIAGNOSIS — R197 Diarrhea, unspecified: Secondary | ICD-10-CM

## 2016-01-31 ENCOUNTER — Other Ambulatory Visit: Payer: Self-pay | Admitting: Internal Medicine

## 2016-01-31 ENCOUNTER — Telehealth: Payer: Self-pay | Admitting: Internal Medicine

## 2016-01-31 DIAGNOSIS — A0472 Enterocolitis due to Clostridium difficile, not specified as recurrent: Secondary | ICD-10-CM

## 2016-01-31 DIAGNOSIS — C73 Malignant neoplasm of thyroid gland: Secondary | ICD-10-CM

## 2016-01-31 LAB — C. DIFFICILE GDH AND TOXIN A/B
C. DIFF TOXIN A/B: DETECTED — AB
C. difficile GDH: DETECTED — AB

## 2016-01-31 MED ORDER — VANCOMYCIN HCL 125 MG PO CAPS: 125.0000 mg | ORAL_CAPSULE | Freq: Four times a day (QID) | ORAL | Status: DC

## 2016-01-31 NOTE — Telephone Encounter (Signed)
Spoke with pt to inform.  

## 2016-01-31 NOTE — Telephone Encounter (Signed)
Let her know she does have c diff again.  I sent her an antibiotic - vanco -- to her pharmacy.  She needs to start this today.  I have also referred her to infectious disease to help [place] get rid of this completely because it can be difficult.

## 2016-02-01 ENCOUNTER — Telehealth: Payer: Self-pay | Admitting: *Deleted

## 2016-02-01 ENCOUNTER — Other Ambulatory Visit (INDEPENDENT_AMBULATORY_CARE_PROVIDER_SITE_OTHER): Payer: Medicare Other

## 2016-02-01 DIAGNOSIS — R300 Dysuria: Secondary | ICD-10-CM | POA: Diagnosis not present

## 2016-02-01 DIAGNOSIS — R197 Diarrhea, unspecified: Secondary | ICD-10-CM | POA: Diagnosis not present

## 2016-02-01 DIAGNOSIS — E139 Other specified diabetes mellitus without complications: Secondary | ICD-10-CM

## 2016-02-01 LAB — URINALYSIS, ROUTINE W REFLEX MICROSCOPIC
BILIRUBIN URINE: NEGATIVE
HGB URINE DIPSTICK: NEGATIVE
KETONES UR: NEGATIVE
Nitrite: NEGATIVE
RBC / HPF: NONE SEEN (ref 0–?)
Specific Gravity, Urine: 1.025 (ref 1.000–1.030)
Urine Glucose: 250 — AB
Urobilinogen, UA: 0.2 (ref 0.0–1.0)
pH: 5.5 (ref 5.0–8.0)

## 2016-02-01 LAB — MICROALBUMIN / CREATININE URINE RATIO
CREATININE, U: 217.5 mg/dL
MICROALB UR: 1.3 mg/dL (ref 0.0–1.9)
MICROALB/CREAT RATIO: 0.6 mg/g (ref 0.0–30.0)

## 2016-02-01 MED ORDER — METRONIDAZOLE 500 MG PO TABS: 500.0000 mg | ORAL_TABLET | Freq: Three times a day (TID) | ORAL | Status: DC

## 2016-02-01 NOTE — Telephone Encounter (Signed)
Receive call from pharmacy stating the Vancomycin that was sent in cost over $200. Pt is requesting cheaper alternative....Mary Acosta

## 2016-02-01 NOTE — Telephone Encounter (Signed)
Will try flagyl -- sent to pharmacy

## 2016-02-01 NOTE — Telephone Encounter (Signed)
Notified pt of med change../lmb 

## 2016-02-03 ENCOUNTER — Encounter: Payer: Self-pay | Admitting: Internal Medicine

## 2016-02-03 LAB — STOOL CULTURE

## 2016-02-03 LAB — URINE CULTURE

## 2016-02-05 ENCOUNTER — Other Ambulatory Visit: Payer: Self-pay | Admitting: Internal Medicine

## 2016-02-05 MED ORDER — NITROFURANTOIN MONOHYD MACRO 100 MG PO CAPS: 100.0000 mg | ORAL_CAPSULE | Freq: Two times a day (BID) | ORAL | Status: DC

## 2016-02-14 ENCOUNTER — Ambulatory Visit
Admission: RE | Admit: 2016-02-14 | Discharge: 2016-02-14 | Disposition: A | Discharge status: Home / Self Care | Payer: Medicare Other | Source: Ambulatory Visit | Attending: Internal Medicine | Admitting: Internal Medicine

## 2016-02-14 DIAGNOSIS — R221 Localized swelling, mass and lump, neck: Secondary | ICD-10-CM | POA: Diagnosis not present

## 2016-02-15 ENCOUNTER — Encounter: Payer: Self-pay | Admitting: Internal Medicine

## 2016-02-21 DIAGNOSIS — Z79899 Other long term (current) drug therapy: Secondary | ICD-10-CM | POA: Diagnosis not present

## 2016-02-21 DIAGNOSIS — G894 Chronic pain syndrome: Secondary | ICD-10-CM | POA: Diagnosis not present

## 2016-02-21 DIAGNOSIS — M5137 Other intervertebral disc degeneration, lumbosacral region: Secondary | ICD-10-CM | POA: Diagnosis not present

## 2016-02-21 DIAGNOSIS — M533 Sacrococcygeal disorders, not elsewhere classified: Secondary | ICD-10-CM | POA: Diagnosis not present

## 2016-02-21 DIAGNOSIS — Z5181 Encounter for therapeutic drug level monitoring: Secondary | ICD-10-CM | POA: Diagnosis not present

## 2016-03-06 ENCOUNTER — Ambulatory Visit (INDEPENDENT_AMBULATORY_CARE_PROVIDER_SITE_OTHER): Payer: Medicare Other | Admitting: Internal Medicine

## 2016-03-06 ENCOUNTER — Encounter: Payer: Self-pay | Admitting: Internal Medicine

## 2016-03-06 VITALS — BP 115/74 | HR 82 | Temp 98.3°F | Wt 268.0 lb

## 2016-03-06 DIAGNOSIS — A047 Enterocolitis due to Clostridium difficile: Secondary | ICD-10-CM | POA: Diagnosis not present

## 2016-03-06 DIAGNOSIS — A0471 Enterocolitis due to Clostridium difficile, recurrent: Secondary | ICD-10-CM

## 2016-03-09 NOTE — Progress Notes (Signed)
RFV: recurrent c.difficile Subjective:    Patient ID: Mary Acosta, female    DOB: 1956/06/21, 60 y.o.   MRN: [id number]  HPI [name] is a 60yo F with history of recurrent cdifficile including requiring to be hospitalized in mid-may for dehydration, syncope related to c.difficile. She states that she felt that this worsened over the last year after having her lap band adjusted. She states that she does get occasional uti, where she would have abtx exposure. Generally has had numerous courses of flagyl to treat her c.difficile. Only recently given a course of oral vancomycin, not familiar with having a taper. She states that she is back to having semi-formed stool . Yesterday she had numerous semi-form stool to watery stool, but feels this is not what her usual presentation of c.difficile. She has only had 1 BM thus far today. Denies any blood in stool, no fevers, or abdominal cramping.  Allergies  Allergen Reactions  . Gabapentin Swelling    Swelling in legs  . Losartan Other (See Comments)    Myalgias and muscle cramping  . Oxycodone Itching  . Sulfa Antibiotics Nausea Only and Rash  . Sulfonamide Derivatives Nausea Only   Current Outpatient Prescriptions on File Prior to Visit  Medication Sig Dispense Refill  . acetaminophen (TYLENOL) 500 MG tablet Take 500 mg by mouth every 4 (four) hours as needed for moderate pain.    Marland Kitchen albuterol (PROVENTIL HFA;VENTOLIN HFA) 108 (90 BASE) MCG/ACT inhaler Inhale 2 puffs into the lungs every 6 (six) hours as needed for wheezing or shortness of breath. 1 Inhaler 2  . amitriptyline (ELAVIL) 50 MG tablet Take 100 mg by mouth daily.   5  . cetirizine (ZYRTEC) 10 MG tablet Take 10 mg by mouth daily as needed for allergies.     Marland Kitchen HYDROmorphone (DILAUDID) 4 MG tablet Take 4 mg by mouth every 6 (six) hours as needed for moderate pain.   0  . insulin NPH Human (NOVOLIN N) 100 UNIT/ML injection Inject 0.4 mLs (40 Units total) into the skin 2 (two)  times daily before a meal. 20 mL 1  . insulin regular (HUMULIN R) 100 units/mL injection Inject 0.1-0.4 mLs (10-40 Units total) into the skin 3 (three) times daily before meals. 30 mL 11  . Insulin Syringe-Needle U-100 (INSULIN SYRINGE 1CC/30GX5/16") 30G X 5/16" 1 ML MISC Use as directed for insulin injections 5 times daily 150 each 1  . ipratropium-albuterol (DUONEB) 0.5-2.5 (3) MG/3ML SOLN Take 3 mLs by nebulization 4 (four) times daily. (Patient taking differently: Take 3 mLs by nebulization every 6 (six) hours as needed. ) 360 mL 11  . levothyroxine (SYNTHROID, LEVOTHROID) 150 MCG tablet Take 150 mcg by mouth daily before breakfast.    . Melatonin 5 MG CAPS Take 5 mg by mouth at bedtime as needed (sleep).     . meloxicam (MOBIC) 7.5 MG tablet Take 7.5 mg by mouth daily as needed for pain.   5  . metroNIDAZOLE (FLAGYL) 500 MG tablet Take 1 tablet (500 mg total) by mouth 3 (three) times daily. 30 tablet 0  . morphine (MS CONTIN) 30 MG 12 hr tablet Take 30 mg by mouth 2 (two) times daily.    . nitrofurantoin, macrocrystal-monohydrate, (MACROBID) 100 MG capsule Take 1 capsule (100 mg total) by mouth 2 (two) times daily. 14 capsule 0  . ondansetron (ZOFRAN) 8 MG tablet Take 1 tablet (8 mg total) by mouth every 8 (eight) hours as needed for nausea or vomiting.  60 tablet 1  . polyethylene glycol (MIRALAX / GLYCOLAX) packet Take 17 g by mouth 2 (two) times daily as needed for moderate constipation. 14 each 0  . potassium chloride SA (K-DUR,KLOR-CON) 20 MEQ tablet Take 40 mEq by mouth daily as needed (for cramping).   1  . tiZANidine (ZANAFLEX) 2 MG tablet Take 2 mg by mouth 3 (three) times daily.  2   No current facility-administered medications on file prior to visit.   Active Ambulatory Problems    Diagnosis Date Noted  . Diabetes 1.5, managed as type 2 ([place]) 09/14/2007  . UNSPECIFIED VITAMIN D DEFICIENCY 10/22/2007  . HLD (hyperlipidemia) 09/14/2007  . OBESITY 10/14/2007  . Anxiety state  08/25/2007  . DEPRESSION 08/25/2007  . Chronic pain syndrome 10/14/2007  . CARPAL TUNNEL SYNDROME 10/14/2007  . PERIPHERAL NEUROPATHY 10/14/2007  . Essential hypertension 08/25/2007  . ALLERGIC RHINITIS CAUSE UNSPECIFIED 10/14/2007  . ASTHMA 08/25/2007  . CONSTIPATION 08/25/2007  . ARTHRITIS 10/14/2007  . LOW BACK PAIN, CHRONIC 10/22/2007  . POLYMYALGIA RHEUMATICA 08/25/2007  . FOOT PAIN, CHRONIC 08/25/2007  . INSOMNIA 10/22/2007  . LEG EDEMA, BILATERAL 08/25/2007  . FATTY LIVER DISEASE, HX OF 10/14/2007  . Hypothyroid 10/14/2007  . Left arm pain   . Diarrhea 03/04/2011  . Heme positive stool 04/05/2011  . Other general symptoms  04/05/2011  . Iron deficiency anemia, unspecified  04/05/2011  . Bariatric surgery status 04/05/2011  . HCAP (healthcare-associated pneumonia) 08/26/2014  . Pedal edema 08/26/2014  . COPD (chronic obstructive pulmonary disease) ([place]) 08/26/2014  . Shortness of breath 09/08/2014  . AKI (acute kidney injury) ([place]) 09/08/2014  . Physical deconditioning 09/26/2014  . COLD (chronic obstructive lung disease) ([place]) 09/26/2014  . Dyspnea 09/26/2014  . Chronic respiratory failure ([place]) 11/10/2014  . Liver cirrhosis ([place]) 12/07/2015  . Chronic diastolic (congestive) heart failure ([place]) 01/03/2016  . Fatty liver 01/03/2016  . Diabetes mellitus with complication ([place]) 75/91/6384  . Recurrent Clostridium difficile diarrhea 01/03/2016  . Hypotension 01/03/2016  . Lump in neck 01/29/2016  . Dysuria 01/29/2016   Resolved Ambulatory Problems    Diagnosis Date Noted  . Unspecified disorder of thyroid 08/25/2007  . HYPERCHOLESTEROLEMIA 08/25/2007  . TOBACCO USER 09/07/2007  . GERD 09/08/2002  . Sleep apnea 08/25/2007  . TIA (transient ischemic attack)   . History of gastroesophageal reflux (GERD) 04/05/2011  . Hyperglycemia 07/09/2014  . HTN (hypertension) 08/26/2014  . SOB (shortness of breath) 09/08/2014  . Cough 12/01/2014  . Insomnia 01/03/2016    Past Medical History  Diagnosis Date  . Anxiety   . Fatty liver disease, nonalcoholic   . Scoliosis   . Asthma   . Depression   . History of TIA (transient ischemic attack) 11-01-2010  . Hyperlipidemia   . Lumbar stenosis   . Seasonal allergies   . Chronic narcotic use   . Frequency of urination   . Urgency of urination   . Nocturia   . Vaginal pain S/P SLING  FEB 2012  . Hypertension   . OSA (obstructive sleep apnea)   . Hypothyroidism   . IDDM (insulin dependent diabetes mellitus) ([place])   . Daily headache   . Osteoarthritis   . DJD (degenerative joint disease)   . Arthritis   . Fibromyalgia   . Chronic lower back pain   . Thyroid cancer ([place])   . Cervical cancer ([place])   . Pneumonia "several times"  . Fatty liver disease, nonalcoholic    Social History  Substance Use Topics  .  Smoking status: Former Smoker -- 2.50 packs/day for 39 years    Types: Cigarettes    Quit date: 10/22/2010  . Smokeless tobacco: Never Used  . Alcohol Use: No  family history includes Breast cancer in her other; Colon cancer in her maternal uncle; Diabetes in her mother; Heart disease in her father and mother.   Review of Systems +watery diarrhea. Otherwise 10 point ros is negative    Objective:   Physical Exam BP 115/74 mmHg  Pulse 82  Temp(Src) 98.3 F (36.8 C) (Oral)  Wt 268 lb (121.564 kg) Physical Exam  Constitutional:  oriented to person, place, and time. appears well-developed and well-nourished. No distress.  HENT: [place]/AT, PERRLA, no scleral icterus Mouth/Throat: Oropharynx is clear and moist. No oropharyngeal exudate.  Cardiovascular: Normal rate, regular rhythm and normal heart sounds. Exam reveals no gallop and no friction rub.  No murmur heard.  Pulmonary/Chest: Effort normal and breath sounds normal. No respiratory distress.  has no wheezes.  Neck = supple, no nuchal rigidity Abdominal: Soft. Bowel sounds are normal.  exhibits no distension. There is no tenderness.   Lymphadenopathy: no cervical adenopathy. No axillary adenopathy Neurological: alert and oriented to person, place, and time.  Skin: Skin is warm and dry. No rash noted. No erythema.  Psychiatric: a normal mood and affect.  behavior is normal.         Assessment & Plan:  Recurrent c.difficile infection = we discussed methods for treatment recurrence including doing vanco taper vs. Being a candidate for FMT. She states that she had colonoscopy roughly 9 years ago and was normal. At this time she is not interested in doing FMT unless she clinically worsens. For now, will give precautions to minimize any unnecessary abtx use. Gave stool kit to retest if she has 3+ watery stools in 24hr.  Spent 45 min with patient with 50% +greater of this time on discussion of treatment options for cdiff

## 2016-03-12 ENCOUNTER — Ambulatory Visit (INDEPENDENT_AMBULATORY_CARE_PROVIDER_SITE_OTHER): Payer: Medicare Other | Admitting: Endocrinology

## 2016-03-12 ENCOUNTER — Encounter: Payer: Self-pay | Admitting: Endocrinology

## 2016-03-12 VITALS — BP 128/73 | HR 69 | Ht 69.0 in | Wt 275.0 lb

## 2016-03-12 DIAGNOSIS — E139 Other specified diabetes mellitus without complications: Secondary | ICD-10-CM

## 2016-03-12 MED ORDER — INSULIN NPH (HUMAN) (ISOPHANE) 100 UNIT/ML ~~LOC~~ SUSP: 40.0000 [IU] | Freq: Every day | SUBCUTANEOUS | Status: DC

## 2016-03-12 MED ORDER — INSULIN REGULAR HUMAN 100 UNIT/ML IJ SOLN: 30.0000 [IU] | Freq: Three times a day (TID) | INTRAMUSCULAR | Status: DC

## 2016-03-12 NOTE — Patient Instructions (Addendum)
good diet and exercise significantly improve the control of your diabetes.  please let me know if you wish to be referred to a dietician.  high blood sugar is very risky to your health.  you should see an eye doctor and dentist every year.  It is very important to get all recommended vaccinations.  Controlling your blood pressure and cholesterol drastically reduces the damage diabetes does to your body.  Those who smoke should quit.  Please discuss these with your doctor.  check your blood sugar twice a day.  vary the time of day when you check, between before the 3 meals, and at bedtime.  also check if you have symptoms of your blood sugar being too high or too low.  please keep a record of the readings and bring it to your next appointment here (or you can bring the meter itself).  You can write it on any piece of paper.  please call us sooner if your blood sugar goes below 70, or if you have a lot of readings over 200. For now: Please reduce the NPH to 40 units just at bedtime, and: Increase the reg to 30 units 3 times a day (just before each meal), no matter what your blood sugar is.   Please come back for a follow-up appointment in 6 weeks Please call us next week, to tell us how the blood sugar is doing. Also, please go back to the weight loss specialist, to see if you need the gastric band adjusted.

## 2016-03-12 NOTE — Progress Notes (Signed)
Subjective:    Patient ID: Mary Acosta, female    DOB: 10/22/1955, 60 y.o.   MRN: [id number]  HPI pt states DM was dx'ed in 2000; she has moderate neuropathy of the lower extremities, and assoc pain; she also has retinopathy; she has been on insulin since 2011; pt says her diet and exercise are "fair;" she has never had GDM, pancreatitis, or severe hypoglycemia.  She had gastric banding in 2008.  She then lost 148 lbs, but  has since regained approx half of that.  In 2015, she had mild DKA, after she misses some insulin doses.  She takes human insulin, due to cost.  She takes NPH, 40-BID, and PRN reg (averages approx 40 units total per day).  She says cbg's vary from 200-300.  She denies hypoglycemia.   Past Medical History  Diagnosis Date  . Anxiety     with panic attacks  . Insomnia   . COPD (chronic obstructive pulmonary disease) ([place])   . Polymyalgia rheumatica ([place])   . Fatty liver disease, nonalcoholic   . Scoliosis   . Asthma   . Depression   . History of TIA (transient ischemic attack) 11-01-2010    NO RESIDUAL  . Hyperlipidemia   . Lumbar stenosis   . Chronic pain syndrome     [facility]  . Seasonal allergies   . Chronic narcotic use   . Frequency of urination   . Urgency of urination   . Nocturia   . Vaginal pain S/P SLING  FEB 2012  . Hypertension   . OSA (obstructive sleep apnea)     NO CPAP SINCE WT LOSS  . Hypothyroidism   . IDDM (insulin dependent diabetes mellitus) ([place])   . Daily headache   . Osteoarthritis     with severe disease in knee  . DJD (degenerative joint disease)   . Arthritis     "back; feet; hands; shoulders" (08/26/2014)  . Fibromyalgia   . Chronic lower back pain   . Thyroid cancer ([place])   . Cervical cancer ([place])   . Pneumonia "several times"  . HCAP (healthcare-associated pneumonia) 08/26/2014  . Fatty liver disease, nonalcoholic     Past Surgical History  Procedure Laterality Date  . Appendectomy  1982  .  Tubal ligation  1983  . Hysteroscopy w/d&c  08-19-2007    PMB  . Laparoscopic gastric banding  03/01/2006    TRUNCAL VAGOTOMY/ PLACEMENT OF VG BAND  . Knee arthroscopy w/ debridement Left 03/29/2006    INTERNAL DERANGEMENT/ SEVERE DJD/ MENISCUS TEARS  . Transvaginal subureteral tape/ sling  09-28-2010    MIXED URINARY INCONTINENCE  . Revision total knee arthroplasty Left 08-29-2008; 05/2009  . Total knee arthroplasty Left 01-23-2007    SEVERE DJD  . Total thyroidectomy  11-22-2005    BILATERAL THYROID NODULES-- PAPILLARY CARCINOMA (0.5CM)/ ADENOMATOID NODULES  . Breast lumpectomy Left 02-28-2005    ATYPICAL DUCTAL HYPERPLASIA  . Cardiac catheterization  09-04-2004    NORMAL CORONARY ANATOMY/ NORMAL LVF/ EF 60%  . Laparoscopic cholecystectomy  06-10-2002  . Tonsillectomy  1969  . Transthoracic echocardiogram  12-27-2010    LVSF NORMAL / EF XX123456 GRADE I DIASTOLIC DYSFUNCTION/ MILD MITRAL REGURG. / MILDLY DILATED LEFT ATRIUM/ MILDY INCREASED SYSTOLIC PRESSURE OF PULMONARY ARTERIES  . Cardiovascular stress test  12-27-2010  DR [place]    ABNORMAL NUCLEAR STUDY W/ /MILD INFERIOR ISCHEMIA/ EF 69%/  CT HEART ANGIOGRAM ;  NO ACUTE FINDINGS  .  Cystoscopy  05/18/2012    Procedure: CYSTOSCOPY;  Surgeon: Reece Packer, MD;  Location[facility];  Service: Urology;  Laterality: N/A;  examination under anethesia  . Cryoablation  05/16/2003    w/LEEP FOR ABNORMAL PAP SMEAR    Social History   Social History  . Marital Status: Married    Spouse Name: N/A  . Number of Children: 2  . Years of Education: N/A   Occupational History  . disabled    Social History Main Topics  . Smoking status: Former Smoker -- 2.50 packs/day for 39 years    Types: Cigarettes    Quit date: 10/22/2010  . Smokeless tobacco: Never Used  . Alcohol Use: No  . Drug Use: No  . Sexual Activity: Not Currently   Other Topics Concern  . Not on file   Social History Narrative    Current  Outpatient Prescriptions on File Prior to Visit  Medication Sig Dispense Refill  . acetaminophen (TYLENOL) 500 MG tablet Take 500 mg by mouth every 4 (four) hours as needed for moderate pain.    Marland Kitchen albuterol (PROVENTIL HFA;VENTOLIN HFA) 108 (90 BASE) MCG/ACT inhaler Inhale 2 puffs into the lungs every 6 (six) hours as needed for wheezing or shortness of breath. 1 Inhaler 2  . amitriptyline (ELAVIL) 50 MG tablet Take 100 mg by mouth daily.   5  . cetirizine (ZYRTEC) 10 MG tablet Take 10 mg by mouth daily as needed for allergies.     Marland Kitchen HYDROmorphone (DILAUDID) 4 MG tablet Take 4 mg by mouth every 6 (six) hours as needed for moderate pain.   0  . Insulin Syringe-Needle U-100 (INSULIN SYRINGE 1CC/30GX5/16") 30G X 5/16" 1 ML MISC Use as directed for insulin injections 5 times daily 150 each 1  . ipratropium-albuterol (DUONEB) 0.5-2.5 (3) MG/3ML SOLN Take 3 mLs by nebulization 4 (four) times daily. (Patient taking differently: Take 3 mLs by nebulization every 6 (six) hours as needed. ) 360 mL 11  . levothyroxine (SYNTHROID, LEVOTHROID) 150 MCG tablet Take 150 mcg by mouth daily before breakfast.    . Melatonin 5 MG CAPS Take 5 mg by mouth at bedtime as needed (sleep).     . meloxicam (MOBIC) 7.5 MG tablet Take 7.5 mg by mouth daily as needed for pain.   5  . morphine (MS CONTIN) 30 MG 12 hr tablet Take 30 mg by mouth 2 (two) times daily.    . nitrofurantoin, macrocrystal-monohydrate, (MACROBID) 100 MG capsule Take 1 capsule (100 mg total) by mouth 2 (two) times daily. 14 capsule 0  . ondansetron (ZOFRAN) 8 MG tablet Take 1 tablet (8 mg total) by mouth every 8 (eight) hours as needed for nausea or vomiting. 60 tablet 1  . polyethylene glycol (MIRALAX / GLYCOLAX) packet Take 17 g by mouth 2 (two) times daily as needed for moderate constipation. 14 each 0  . potassium chloride SA (K-DUR,KLOR-CON) 20 MEQ tablet Take 40 mEq by mouth daily as needed (for cramping).   1  . tiZANidine (ZANAFLEX) 2 MG tablet Take 2  mg by mouth 3 (three) times daily.  2   No current facility-administered medications on file prior to visit.    Allergies  Allergen Reactions  . Gabapentin Swelling    Swelling in legs  . Losartan Other (See Comments)    Myalgias and muscle cramping  . Oxycodone Itching  . Sulfa Antibiotics Nausea Only and Rash  . Sulfonamide Derivatives Nausea Only    Family  History  Problem Relation Age of Onset  . Colon cancer Maternal Uncle     x 2  . Breast cancer Other     great aunts x 5  . Diabetes Mother   . Heart disease Father   . Heart disease Mother     BP 128/73 mmHg  Pulse 69  Ht 5\' 9"  (1.753 m)  Wt 275 lb (124.739 kg)  BMI 40.59 kg/m2     Review of Systems denies blurry vision, headache, chest pain, n/v, urinary frequency, excessive diaphoresis, cold intolerance, and easy bruising.  She has chronic pain of both legs, and assoc numbness.  She has chronic fatigue, leg cramps, anxiety, rhinorrhea, and doe.      Objective:   Physical Exam VS: see vs page GEN: no distress.  Morbid obesity HEAD: head: no deformity eyes: no periorbital swelling, no proptosis external nose and ears are normal mouth: no lesion seen NECK: a healed scar is present.  i do not appreciate a nodule in the thyroid or elsewhere in the neck CHEST WALL: no deformity LUNGS: clear to auscultation CV: reg rate and rhythm, no murmur.   ABD: abdomen is soft, nontender.  no hepatosplenomegaly.  not distended.  no hernia.  Gastric band port is palpable MUSCULOSKELETAL: muscle bulk and strength are grossly normal.  no obvious joint swelling.  gait is normal and steady EXTEMITIES: no deformity.  no ulcer on the feet.  feet are of normal color and temp.  1+ bilat leg edema, and VV's.  There is bilateral onychomycosis of the toenails. PULSES: dorsalis pedis intact bilat.  no carotid bruit NEURO:  cn 2-12 grossly intact.   readily moves all 4's.  sensation is intact to touch on the feet, but decreased from  normal. SKIN:  Normal texture and temperature.  No rash or suspicious lesion is visible.  There is hyperpigmentation and erythema of the legs.   NODES:  None palpable at the neck PSYCH: alert, well-oriented.  Does not appear anxious nor depressed.     Lab Results  Component Value Date   HGBA1C 9.7* 01/29/2016   Lab Results  Component Value Date   CREATININE 0.91 01/29/2016   BUN 19 01/29/2016   NA 134* 01/29/2016   K 3.5 01/29/2016   CL 99 01/29/2016   CO2 27 01/29/2016   Lab Results  Component Value Date   MICROALBUR 1.3 02/01/2016   CT: Pancreas: Atrophy without duct enlargement or evidence of mass.  i personally reviewed electrocardiogram tracing (12/03/15): Indication: SOB Impression: RBBB    Assessment & Plan:  Type 1 DM, new to me: The pattern of his cbg's indicates she needs some adjustment in his therapy.  Obesity: worse.    Patient is advised the following: Patient Instructions  good diet and exercise significantly improve the control of your diabetes.  please let me know if you wish to be referred to a dietician.  high blood sugar is very risky to your health.  you should see an eye doctor and dentist every year.  It is very important to get all recommended vaccinations.  Controlling your blood pressure and cholesterol drastically reduces the damage diabetes does to your body.  Those who smoke should quit.  Please discuss these with your doctor.  check your blood sugar twice a day.  vary the time of day when you check, between before the 3 meals, and at bedtime.  also check if you have symptoms of your blood sugar being too high or too  low.  please keep a record of the readings and bring it to your next appointment here (or you can bring the meter itself).  You can write it on any piece of paper.  please call us sooner if your blood sugar goes below 70, or if you have a lot of readings over 200. For now: Please reduce the NPH to 40 units just at bedtime, and: Increase  the reg to 30 units 3 times a day (just before each meal), no matter what your blood sugar is.   Please come back for a follow-up appointment in 6 weeks Please call us next week, to tell us how the blood sugar is doing. Also, please go back to the weight loss specialist, to see if you need the gastric band adjusted.    Renato Shin, MD

## 2016-03-13 ENCOUNTER — Telehealth: Payer: Self-pay | Admitting: Endocrinology

## 2016-03-13 MED ORDER — INSULIN REGULAR HUMAN 100 UNIT/ML IJ SOLN
30.0000 [IU] | Freq: Three times a day (TID) | INTRAMUSCULAR | Status: DC
Start: 2016-03-13 — End: 2016-05-16

## 2016-03-13 NOTE — Telephone Encounter (Signed)
PA need for the Humulin R please call # [phone]  Pt ID [id number]

## 2016-03-13 NOTE — Telephone Encounter (Signed)
Rx changed and sent. Pt notified. Pharmacy will have the medication on hand tomorrow for the pt to pick up. Pt verbalized understanding.

## 2016-03-13 NOTE — Telephone Encounter (Signed)
ok 

## 2016-03-13 NOTE — Telephone Encounter (Signed)
See below. Alternative is Novolin R. Ok to change?

## 2016-03-14 ENCOUNTER — Telehealth: Payer: Self-pay | Admitting: Emergency Medicine

## 2016-03-14 NOTE — Telephone Encounter (Signed)
Pt called and needs to make an appointment to have her bottom teeth pulled. Last time she had an anxiety attack so she was wondering if she can get enough xanax to go get that done. Please follow up thanks.

## 2016-03-15 MED ORDER — ALPRAZOLAM 0.5 MG PO TABS: ORAL_TABLET | ORAL | Status: DC

## 2016-03-15 NOTE — Telephone Encounter (Signed)
RX faxed to [facility] 

## 2016-03-15 NOTE — Telephone Encounter (Signed)
rx printed

## 2016-03-15 NOTE — Telephone Encounter (Signed)
Please advise 

## 2016-04-04 ENCOUNTER — Telehealth: Payer: Self-pay | Admitting: Emergency Medicine

## 2016-04-04 NOTE — Telephone Encounter (Signed)
Spoke with Manus Gunning to inform Dr Quay Burow would be the attending physican

## 2016-04-04 NOTE — Telephone Encounter (Signed)
Hospice wants to know if Dr Burn will be attending physician if patient is enrolled in palliative care. Please follow up thanks.

## 2016-04-08 ENCOUNTER — Encounter: Payer: Self-pay | Admitting: Internal Medicine

## 2016-04-08 ENCOUNTER — Ambulatory Visit (INDEPENDENT_AMBULATORY_CARE_PROVIDER_SITE_OTHER): Payer: Medicare Other | Admitting: Internal Medicine

## 2016-04-08 ENCOUNTER — Other Ambulatory Visit (INDEPENDENT_AMBULATORY_CARE_PROVIDER_SITE_OTHER): Payer: Medicare Other

## 2016-04-08 VITALS — BP 152/86 | HR 102 | Temp 98.6°F | Resp 18 | Ht 69.0 in | Wt 278.0 lb

## 2016-04-08 DIAGNOSIS — E1042 Type 1 diabetes mellitus with diabetic polyneuropathy: Secondary | ICD-10-CM

## 2016-04-08 DIAGNOSIS — R300 Dysuria: Secondary | ICD-10-CM

## 2016-04-08 DIAGNOSIS — I10 Essential (primary) hypertension: Secondary | ICD-10-CM

## 2016-04-08 DIAGNOSIS — M79676 Pain in unspecified toe(s): Secondary | ICD-10-CM

## 2016-04-08 DIAGNOSIS — R197 Diarrhea, unspecified: Secondary | ICD-10-CM | POA: Diagnosis not present

## 2016-04-08 LAB — URINALYSIS, ROUTINE W REFLEX MICROSCOPIC
Bilirubin Urine: NEGATIVE
HGB URINE DIPSTICK: NEGATIVE
Ketones, ur: NEGATIVE
Leukocytes, UA: NEGATIVE
Nitrite: POSITIVE — AB
SPECIFIC GRAVITY, URINE: 1.01 (ref 1.000–1.030)
TOTAL PROTEIN, URINE-UPE24: NEGATIVE
UROBILINOGEN UA: 0.2 (ref 0.0–1.0)
pH: 5.5 (ref 5.0–8.0)

## 2016-04-08 LAB — POCT URINALYSIS DIPSTICK
Bilirubin, UA: NEGATIVE
Ketones, UA: NEGATIVE
Leukocytes, UA: NEGATIVE
NITRITE UA: NEGATIVE
Protein, UA: NEGATIVE
RBC UA: NEGATIVE
Spec Grav, UA: 1.025
UROBILINOGEN UA: 0.2
pH, UA: 6

## 2016-04-08 MED ORDER — FLUCONAZOLE 150 MG PO TABS: 150.0000 mg | ORAL_TABLET | Freq: Every day | ORAL | 0 refills | Status: DC

## 2016-04-08 MED ORDER — VALSARTAN 80 MG PO TABS: 80.0000 mg | ORAL_TABLET | Freq: Every day | ORAL | Status: DC

## 2016-04-08 NOTE — Progress Notes (Signed)
Patient received education resource, including the self-management goal and tool. Patient verbalized understanding. 

## 2016-04-08 NOTE — Patient Instructions (Addendum)
  Test(s) ordered today. Your results will be released to [place] (or called to you) after review, usually within 72hours after test completion. If any changes need to be made, you will be notified at that same time.  Medications reviewed and updated.  Changes include changing your diovan to 80 mg daily.  Monitor your blood pressure at home.   Your prescription(s) have been submitted to your pharmacy. Please take as directed and contact our office if you believe you are having problem(s) with the medication(s).  A referral was ordered for podiatry.     Please followup in october

## 2016-04-08 NOTE — Progress Notes (Signed)
Subjective:    Patient ID: Mary Acosta, female    DOB: Mar 29, 1956, 60 y.o.   MRN: [id number]  HPI She is here for follow up.  Diabetes: She is following with endocrine.  She is taking her medication daily as prescribed. She is compliant with a diabetic diet. She is not exercising regularly. She monitors her sugars and they have been running 200's. She checks her feet daily and denies foot lesions. She is up-to-date with an ophthalmology examination.   Hypertension: She is not taking her medication daily. Her BP is good some days without taking the medication and she does not feel she needs it daily.  She is compliant with a low sodium diet.  She denies chest pain, palpitations and regular headaches. She is not exercising regularly.  She does monitor her blood pressure at home.    Diarrhea, history of cdiff x 2-3:  She started to have diarrhea three days ago.  She has increased gas.  She denies abdominal pain, except for related to gas.  She denies fever.  She is concerned the cdiff has returned.    Chronic pain syndrome:  She follows with pain management.    Anxiety:  She has anxiety.  It causes her to eat more than she should.  She has an appointment with a neuropsychiatrist soon.  She wonders if a med change will help.   Dysuria:  She has burning when she urinates and is unsure if she has a yeast infection or UTI.  She did use monistat and it helped.    Medications and allergies reviewed with patient and updated if appropriate.  Patient Active Problem List   Diagnosis Date Noted  . Lump in neck 01/29/2016  . Dysuria 01/29/2016  . Chronic diastolic (congestive) heart failure ([place]) 01/03/2016  . Fatty liver 01/03/2016  . Type 1 diabetes mellitus ([place]) 01/03/2016  . Recurrent Clostridium difficile diarrhea 01/03/2016  . Hypotension 01/03/2016  . Liver cirrhosis ([place]) 12/07/2015  . Chronic respiratory failure ([place]) 11/10/2014  . Physical deconditioning 09/26/2014  . COLD  (chronic obstructive lung disease) ([place]) 09/26/2014  . Dyspnea 09/26/2014  . AKI (acute kidney injury) ([place]) 09/08/2014  . HCAP (healthcare-associated pneumonia) 08/26/2014  . Pedal edema 08/26/2014  . COPD (chronic obstructive pulmonary disease) ([place]) 08/26/2014  . Heme positive stool 04/05/2011  . Iron deficiency anemia, unspecified  04/05/2011  . Bariatric surgery status 04/05/2011  . Diarrhea 03/04/2011  . Left arm pain   . UNSPECIFIED VITAMIN D DEFICIENCY 10/22/2007  . LOW BACK PAIN, CHRONIC 10/22/2007  . INSOMNIA 10/22/2007  . OBESITY 10/14/2007  . Chronic pain syndrome 10/14/2007  . CARPAL TUNNEL SYNDROME 10/14/2007  . PERIPHERAL NEUROPATHY 10/14/2007  . ALLERGIC RHINITIS CAUSE UNSPECIFIED 10/14/2007  . ARTHRITIS 10/14/2007  . Hypothyroid 10/14/2007  . HLD (hyperlipidemia) 09/14/2007  . Anxiety state 08/25/2007  . DEPRESSION 08/25/2007  . Essential hypertension 08/25/2007  . ASTHMA 08/25/2007  . CONSTIPATION 08/25/2007  . POLYMYALGIA RHEUMATICA 08/25/2007  . FOOT PAIN, CHRONIC 08/25/2007  . LEG EDEMA, BILATERAL 08/25/2007    Current Outpatient Prescriptions on File Prior to Visit  Medication Sig Dispense Refill  . acetaminophen (TYLENOL) 500 MG tablet Take 500 mg by mouth every 4 (four) hours as needed for moderate pain.    Marland Kitchen albuterol (PROVENTIL HFA;VENTOLIN HFA) 108 (90 BASE) MCG/ACT inhaler Inhale 2 puffs into the lungs every 6 (six) hours as needed for wheezing or shortness of breath. 1 Inhaler 2  . ALPRAZolam (XANAX) 0.5 MG  tablet Take 1 hour prior to appointment, may repeat x 1 if needed 2 tablet 0  . amitriptyline (ELAVIL) 50 MG tablet Take 100 mg by mouth daily.   5  . cetirizine (ZYRTEC) 10 MG tablet Take 10 mg by mouth daily as needed for allergies.     Marland Kitchen HYDROmorphone (DILAUDID) 4 MG tablet Take 4 mg by mouth every 6 (six) hours as needed for moderate pain.   0  . insulin NPH Human (NOVOLIN N) 100 UNIT/ML injection Inject 0.4 mLs (40 Units total) into the  skin at bedtime. 20 mL 11  . insulin regular (NOVOLIN R) 100 units/mL injection Inject 0.3 mLs (30 Units total) into the skin 3 (three) times daily before meals. 30 mL 2  . Insulin Syringe-Needle U-100 (INSULIN SYRINGE 1CC/30GX5/16") 30G X 5/16" 1 ML MISC Use as directed for insulin injections 5 times daily 150 each 1  . ipratropium-albuterol (DUONEB) 0.5-2.5 (3) MG/3ML SOLN Take 3 mLs by nebulization 4 (four) times daily. (Patient taking differently: Take 3 mLs by nebulization every 6 (six) hours as needed. ) 360 mL 11  . levothyroxine (SYNTHROID, LEVOTHROID) 150 MCG tablet Take 150 mcg by mouth daily before breakfast.    . Melatonin 5 MG CAPS Take 5 mg by mouth at bedtime as needed (sleep).     . meloxicam (MOBIC) 7.5 MG tablet Take 7.5 mg by mouth daily as needed for pain.   5  . morphine (MS CONTIN) 30 MG 12 hr tablet Take 30 mg by mouth 2 (two) times daily.    . ondansetron (ZOFRAN) 8 MG tablet Take 1 tablet (8 mg total) by mouth every 8 (eight) hours as needed for nausea or vomiting. 60 tablet 1  . polyethylene glycol (MIRALAX / GLYCOLAX) packet Take 17 g by mouth 2 (two) times daily as needed for moderate constipation. 14 each 0  . potassium chloride SA (K-DUR,KLOR-CON) 20 MEQ tablet Take 40 mEq by mouth daily as needed (for cramping).   1  . tiZANidine (ZANAFLEX) 2 MG tablet Take 2 mg by mouth 3 (three) times daily.  2   No current facility-administered medications on file prior to visit.     Past Medical History:  Diagnosis Date  . Anxiety    with panic attacks  . Arthritis    "back; feet; hands; shoulders" (08/26/2014)  . Asthma   . Cervical cancer ([place])   . Chronic lower back pain   . Chronic narcotic use   . Chronic pain syndrome    [facility]  . COPD (chronic obstructive pulmonary disease) ([place])   . Daily headache   . Depression   . DJD (degenerative joint disease)   . Fatty liver disease, nonalcoholic   . Fatty liver disease, nonalcoholic   . Fibromyalgia    . Frequency of urination   . HCAP (healthcare-associated pneumonia) 08/26/2014  . History of TIA (transient ischemic attack) 11-01-2010   NO RESIDUAL  . Hyperlipidemia   . Hypertension   . Hypothyroidism   . IDDM (insulin dependent diabetes mellitus) ([place])   . Insomnia   . Lumbar stenosis   . Nocturia   . OSA (obstructive sleep apnea)    NO CPAP SINCE WT LOSS  . Osteoarthritis    with severe disease in knee  . Pneumonia "several times"  . Polymyalgia rheumatica ([place])   . Scoliosis   . Seasonal allergies   . Thyroid cancer ([place])   . Urgency of urination   . Vaginal  pain S/P SLING  FEB 2012    Past Surgical History:  Procedure Laterality Date  . APPENDECTOMY  1982  . BREAST LUMPECTOMY Left 02-28-2005   ATYPICAL DUCTAL HYPERPLASIA  . CARDIAC CATHETERIZATION  09-04-2004   NORMAL CORONARY ANATOMY/ NORMAL LVF/ EF 60%  . CARDIOVASCULAR STRESS TEST  12-27-2010  DR [place]   ABNORMAL NUCLEAR STUDY W/ /MILD INFERIOR ISCHEMIA/ EF 69%/  CT HEART ANGIOGRAM ;  NO ACUTE FINDINGS  . CRYOABLATION  05/16/2003   w/LEEP FOR ABNORMAL PAP SMEAR  . CYSTOSCOPY  05/18/2012   Procedure: CYSTOSCOPY;  Surgeon: Reece Packer, MD;  [facility];  Service: Urology;  Laterality: N/A;  examination under anethesia  . HYSTEROSCOPY W/D&C  08-19-2007   PMB  . KNEE ARTHROSCOPY W/ DEBRIDEMENT Left 03/29/2006   INTERNAL DERANGEMENT/ SEVERE DJD/ MENISCUS TEARS  . LAPAROSCOPIC CHOLECYSTECTOMY  06-10-2002  . LAPAROSCOPIC GASTRIC BANDING  03/01/2006   TRUNCAL VAGOTOMY/ PLACEMENT OF VG BAND  . REVISION TOTAL KNEE ARTHROPLASTY Left 08-29-2008; 05/2009  . TONSILLECTOMY  1969  . TOTAL KNEE ARTHROPLASTY Left 01-23-2007   SEVERE DJD  . TOTAL THYROIDECTOMY  11-22-2005   BILATERAL THYROID NODULES-- PAPILLARY CARCINOMA (0.5CM)/ ADENOMATOID NODULES  . TRANSTHORACIC ECHOCARDIOGRAM  12-27-2010   LVSF NORMAL / EF XX123456 GRADE I DIASTOLIC DYSFUNCTION/ MILD MITRAL REGURG. / MILDLY DILATED LEFT  ATRIUM/ MILDY INCREASED SYSTOLIC PRESSURE OF PULMONARY ARTERIES  . TRANSVAGINAL SUBURETERAL TAPE/ SLING  09-28-2010   MIXED URINARY INCONTINENCE  . TUBAL LIGATION  1983    Social History   Social History  . Marital status: Married    Spouse name: N/A  . Number of children: 2  . Years of education: N/A   Occupational History  . disabled Unemployed   Social History Main Topics  . Smoking status: Former Smoker    Packs/day: 2.50    Years: 39.00    Types: Cigarettes    Quit date: 10/22/2010  . Smokeless tobacco: Never Used  . Alcohol use No  . Drug use: No  . Sexual activity: Not Currently   Other Topics Concern  . None   Social History Narrative  . None    Family History  Problem Relation Age of Onset  . Diabetes Mother   . Heart disease Mother   . Heart disease Father   . Colon cancer Maternal Uncle     x 2  . Breast cancer Other     great aunts x 5    Review of Systems  Constitutional: Negative for fever.  Respiratory: Positive for cough, shortness of breath and wheezing.        At baseline  Cardiovascular: Positive for leg swelling (if sits too long). Negative for chest pain and palpitations.  Gastrointestinal: Positive for diarrhea. Negative for abdominal pain, blood in stool and nausea.  Neurological: Positive for numbness. Negative for light-headedness and headaches.       Objective:   Vitals:   04/08/16 1353  BP: (!) 152/86  Pulse: (!) 102  Resp: 18  Temp: 98.6 F (37 C)   Filed Weights   04/08/16 1353  Weight: 278 lb (126.1 kg)   Body mass index is 41.05 kg/m.   Physical Exam Constitutional: Appears well-developed and well-nourished. No distress.  HENT:  Head: Normocephalic.  Healing scar left posterior lateral head from fall in May 2017  Neck: Neck supple. No tracheal deviation present. No thyromegaly present.  Cardiovascular: Normal rate, regular rhythm and normal heart sounds.   No murmur heard.  No carotid bruit  Pulmonary/Chest:  Effort normal and breath sounds normal. No respiratory distress. No has no wheezes. No rales.  Abdomen:  Obese, port from lap band nontender, no other tenderness Musculoskeletal: mild edema with chronic skin changes.   Lymphadenopathy: No cervical adenopathy.  Skin: Skin is warm and dry. Not diaphoretic. black tender area on plantar surface of toe - present for > 1 month, dry skin on feet Psychiatric: Normal mood and affect. Behavior is normal.       Assessment & Plan:   See Problem List for Assessment and Plan of chronic medical problems.

## 2016-04-09 ENCOUNTER — Other Ambulatory Visit: Payer: Self-pay | Admitting: Internal Medicine

## 2016-04-09 ENCOUNTER — Other Ambulatory Visit: Payer: Medicare Other

## 2016-04-09 DIAGNOSIS — M79609 Pain in unspecified limb: Secondary | ICD-10-CM | POA: Insufficient documentation

## 2016-04-09 DIAGNOSIS — R197 Diarrhea, unspecified: Secondary | ICD-10-CM

## 2016-04-09 NOTE — Assessment & Plan Note (Signed)
Urine dip negative for infection Possible yeast infection - symptoms improved at home with monistat Will check urinalysis Diflucan 150 mg x 1

## 2016-04-09 NOTE — Assessment & Plan Note (Signed)
Concern for return of cdiff Check stool cultures, cdiff

## 2016-04-09 NOTE — Assessment & Plan Note (Signed)
Black painful lesion on toe - present for over one month - no improvement Will refer to podiatry

## 2016-04-09 NOTE — Assessment & Plan Note (Signed)
BP high here, but ? Controlled at home Not taking medication daily because BP is good at home without medication Decrease valsartan to 80 mg daily - stressed the importance of taking it daily Will re-evaluate and make sure BP is well controlled She needs to bring her cuff in for calibration

## 2016-04-09 NOTE — Assessment & Plan Note (Signed)
Taking medication daily, trying to eat well Will follow up with Dr Loanne Drilling - management per him

## 2016-04-10 ENCOUNTER — Telehealth: Payer: Self-pay

## 2016-04-10 ENCOUNTER — Other Ambulatory Visit: Payer: Self-pay | Admitting: Internal Medicine

## 2016-04-10 ENCOUNTER — Telehealth: Payer: Self-pay | Admitting: Internal Medicine

## 2016-04-10 LAB — C. DIFFICILE GDH AND TOXIN A/B
C. DIFF TOXIN A/B: NOT DETECTED
C. DIFFICILE GDH: DETECTED — AB

## 2016-04-10 LAB — CLOSTRIDIUM DIFFICILE BY PCR: CDIFFPCR: DETECTED — AB

## 2016-04-10 MED ORDER — VANCOMYCIN 50 MG/ML ORAL SOLUTION
ORAL | 0 refills | Status: DC
Start: 2016-04-10 — End: 2016-06-04

## 2016-04-10 MED ORDER — VANCOMYCIN HCL 125 MG PO CAPS: ORAL_CAPSULE | ORAL | 0 refills | Status: DC

## 2016-04-10 NOTE — Telephone Encounter (Signed)
Spoke with pt to inform results are not back yet.    Called infectious disease to clarify if pt needs to follow through with appt on 8/17, informed pts husband that she does need to keep appt for tomorrow.

## 2016-04-10 NOTE — Telephone Encounter (Signed)
Let her know she is c diff pos - I will send an antibiotic to her pof .  She needs to see ID tomorrow

## 2016-04-10 NOTE — Telephone Encounter (Signed)
Called patient - antibiotic sent to pharmacy.  She will f/u with ID tomorrow.

## 2016-04-10 NOTE — Telephone Encounter (Signed)
Please follow up with patient as soon as possible on stool sample results.

## 2016-04-10 NOTE — Telephone Encounter (Signed)
[facility] called with a critical on patient, patient tested positive for CDIFF

## 2016-04-11 ENCOUNTER — Ambulatory Visit (INDEPENDENT_AMBULATORY_CARE_PROVIDER_SITE_OTHER): Payer: Medicare Other | Admitting: Internal Medicine

## 2016-04-11 ENCOUNTER — Encounter: Payer: Self-pay | Admitting: Internal Medicine

## 2016-04-11 VITALS — BP 133/83 | HR 81 | Temp 98.3°F | Wt 276.0 lb

## 2016-04-11 DIAGNOSIS — A0471 Enterocolitis due to Clostridium difficile, recurrent: Secondary | ICD-10-CM

## 2016-04-11 DIAGNOSIS — A047 Enterocolitis due to Clostridium difficile: Secondary | ICD-10-CM

## 2016-04-11 NOTE — Progress Notes (Addendum)
Patient ID: Mary Acosta, female   DOB: 01-12-1956, 60 y.o.   MRN: [id number]  HPI Mary Acosta is a Started back on cdiff treatment at qid yesterday due to recurrence. Now interested in cdff fmt since 3rd occurrence. She states that she feels that her symptoms are still severe with watery stools, cramping with diarrhea. She is having more than 4-5 BM per day. Has not seen improvement with starting treatment though she has just started treatment yesterday.  She has had prior colonoscopy at [facility] back on 2012, 2009. Path reports from her latest colonoscopy appears that she may have had lymphocytic colitis vs post infectious colitis.  Outpatient Encounter Prescriptions as of 04/11/2016  Medication Sig  . amitriptyline (ELAVIL) 50 MG tablet Take 100 mg by mouth daily.   . cetirizine (ZYRTEC) 10 MG tablet Take 10 mg by mouth daily as needed for allergies.   . fluconazole (DIFLUCAN) 150 MG tablet Take 1 tablet (150 mg total) by mouth daily.  Marland Kitchen HYDROmorphone (DILAUDID) 4 MG tablet Take 4 mg by mouth every 6 (six) hours as needed for moderate pain.   Marland Kitchen insulin NPH Human (NOVOLIN N) 100 UNIT/ML injection Inject 0.4 mLs (40 Units total) into the skin at bedtime.  . insulin regular (NOVOLIN R) 100 units/mL injection Inject 0.3 mLs (30 Units total) into the skin 3 (three) times daily before meals.  . Insulin Syringe-Needle U-100 (INSULIN SYRINGE 1CC/30GX5/16") 30G X 5/16" 1 ML MISC Use as directed for insulin injections 5 times daily  . ipratropium-albuterol (DUONEB) 0.5-2.5 (3) MG/3ML SOLN Take 3 mLs by nebulization 4 (four) times daily. (Patient taking differently: Take 3 mLs by nebulization every 6 (six) hours as needed. )  . potassium chloride SA (K-DUR,KLOR-CON) 20 MEQ tablet Take 40 mEq by mouth daily as needed (for cramping).   Marland Kitchen tiZANidine (ZANAFLEX) 2 MG tablet Take 2 mg by mouth 3 (three) times daily.  . valsartan (DIOVAN) 80 MG tablet Take 1 tablet (80 mg total) by mouth daily.  .  vancomycin (VANCOCIN) 50 mg/mL oral solution Take 125 mg QID x 2 weeks, then 125 mg TID x 7 days, then 125 mg QD x 7 days, then 125 mg QOD x 2 weeks  . acetaminophen (TYLENOL) 500 MG tablet Take 500 mg by mouth every 4 (four) hours as needed for moderate pain.  Marland Kitchen albuterol (PROVENTIL HFA;VENTOLIN HFA) 108 (90 BASE) MCG/ACT inhaler Inhale 2 puffs into the lungs every 6 (six) hours as needed for wheezing or shortness of breath.  . ALPRAZolam (XANAX) 0.5 MG tablet Take 1 hour prior to appointment, may repeat x 1 if needed (Patient not taking: Reported on 04/11/2016)  . levothyroxine (SYNTHROID, LEVOTHROID) 150 MCG tablet Take 150 mcg by mouth daily before breakfast.  . Melatonin 5 MG CAPS Take 5 mg by mouth at bedtime as needed (sleep).   . meloxicam (MOBIC) 7.5 MG tablet Take 7.5 mg by mouth daily as needed for pain.   Marland Kitchen morphine (MS CONTIN) 30 MG 12 hr tablet Take 30 mg by mouth 2 (two) times daily.  . ondansetron (ZOFRAN) 8 MG tablet Take 1 tablet (8 mg total) by mouth every 8 (eight) hours as needed for nausea or vomiting. (Patient not taking: Reported on 04/11/2016)  . polyethylene glycol (MIRALAX / GLYCOLAX) packet Take 17 g by mouth 2 (two) times daily as needed for moderate constipation. (Patient not taking: Reported on 04/11/2016)   No facility-administered encounter medications on file as of 04/11/2016.  Patient Active Problem List   Diagnosis Date Noted  . Pain in toe 04/09/2016  . Lump in neck 01/29/2016  . Dysuria 01/29/2016  . Chronic diastolic (congestive) heart failure ([place]) 01/03/2016  . Fatty liver 01/03/2016  . Type 1 diabetes mellitus ([place]) 01/03/2016  . Recurrent Clostridium difficile diarrhea 01/03/2016  . Liver cirrhosis ([place]) 12/07/2015  . Chronic respiratory failure ([place]) 11/10/2014  . Physical deconditioning 09/26/2014  . COLD (chronic obstructive lung disease) ([place]) 09/26/2014  . Dyspnea 09/26/2014  . HCAP (healthcare-associated pneumonia) 08/26/2014  . COPD  (chronic obstructive pulmonary disease) ([place]) 08/26/2014  . Heme positive stool 04/05/2011  . Iron deficiency anemia, unspecified  04/05/2011  . Bariatric surgery status 04/05/2011  . Diarrhea 03/04/2011  . Left arm pain   . UNSPECIFIED VITAMIN D DEFICIENCY 10/22/2007  . LOW BACK PAIN, CHRONIC 10/22/2007  . INSOMNIA 10/22/2007  . OBESITY 10/14/2007  . Chronic pain syndrome 10/14/2007  . CARPAL TUNNEL SYNDROME 10/14/2007  . PERIPHERAL NEUROPATHY 10/14/2007  . ALLERGIC RHINITIS CAUSE UNSPECIFIED 10/14/2007  . ARTHRITIS 10/14/2007  . Hypothyroid 10/14/2007  . HLD (hyperlipidemia) 09/14/2007  . Anxiety state 08/25/2007  . DEPRESSION 08/25/2007  . Essential hypertension 08/25/2007  . ASTHMA 08/25/2007  . CONSTIPATION 08/25/2007  . POLYMYALGIA RHEUMATICA 08/25/2007  . LEG EDEMA, BILATERAL 08/25/2007     Health Maintenance Due  Topic Date Due  . Hepatitis C Screening  [birth year]  . FOOT EXAM  06/15/1966  . OPHTHALMOLOGY EXAM  06/15/1966  . TETANUS/TDAP  06/16/1975  . INFLUENZA VACCINE  03/26/2016     Review of Systems  Physical Exam   BP 133/83   Pulse 81   Temp 98.3 F (36.8 C) (Oral)   Wt 276 lb (125.2 kg)   SpO2 96%   BMI 40.76 kg/m  Physical Exam  Constitutional:  oriented to person, place, and time. appears well-developed and well-nourished. No distress.  HENT: /AT, PERRLA, no scleral icterus Mouth/Throat: Oropharynx is clear and moist. No oropharyngeal exudate.  Cardiovascular: Normal rate, regular rhythm and normal heart sounds. Exam reveals no gallop and no friction rub.  No murmur heard.  Pulmonary/Chest: Effort normal and breath sounds normal. No respiratory distress.  has no wheezes.  Neck = supple, no nuchal rigidity Abdominal: Soft. Bowel sounds are normal.  exhibits no distension. There is no tenderness.  Lymphadenopathy: no cervical adenopathy. No axillary adenopathy Neurological: alert and oriented to person, place, and time.  Skin: Skin is warm  and dry. No rash noted. No erythema.  Psychiatric: a normal mood and affect.  behavior is normal.   CBC Lab Results  Component Value Date   WBC 5.8 01/29/2016   RBC 4.72 01/29/2016   HGB 11.9 (L) 01/29/2016   HCT 36.5 01/29/2016   PLT 208.0 01/29/2016   MCV 77.3 (L) 01/29/2016   MCH 24.3 (L) 01/05/2016   MCHC 32.5 01/29/2016   RDW 16.3 (H) 01/29/2016   LYMPHSABS 2.0 01/29/2016   MONOABS 0.6 01/29/2016   EOSABS 0.4 01/29/2016   BASOSABS 0.0 01/29/2016   BMET Lab Results  Component Value Date   NA 134 (L) 01/29/2016   K 3.5 01/29/2016   CL 99 01/29/2016   CO2 27 01/29/2016   GLUCOSE 333 (H) 01/29/2016   BUN 19 01/29/2016   CREATININE 0.91 01/29/2016   CALCIUM 8.9 01/29/2016   GFRNONAA 34 (L) 01/06/2016   GFRAA 39 (L) 01/06/2016     Assessment and Plan  Plan to do vanco taper at QID x 2 wk, tid x  1 wk, keep on bid until can get FMT scheduled.  Will send letter to [employer] since financial constraint. If rejected then we will get her husband screened  Refer to [place] for fmt  My concern is that after FMT she may no longer have cdifficile but still suffer from her underlying diarrheal process.

## 2016-04-13 LAB — STOOL CULTURE

## 2016-04-23 ENCOUNTER — Ambulatory Visit: Payer: Medicare Other | Admitting: Endocrinology

## 2016-04-30 ENCOUNTER — Ambulatory Visit (INDEPENDENT_AMBULATORY_CARE_PROVIDER_SITE_OTHER): Payer: Medicare Other | Admitting: Internal Medicine

## 2016-04-30 ENCOUNTER — Telehealth: Payer: Self-pay | Admitting: Internal Medicine

## 2016-04-30 ENCOUNTER — Encounter: Payer: Self-pay | Admitting: Internal Medicine

## 2016-04-30 VITALS — BP 124/76 | HR 89 | Ht 69.0 in | Wt 278.6 lb

## 2016-04-30 DIAGNOSIS — J209 Acute bronchitis, unspecified: Secondary | ICD-10-CM

## 2016-04-30 DIAGNOSIS — J449 Chronic obstructive pulmonary disease, unspecified: Secondary | ICD-10-CM | POA: Diagnosis not present

## 2016-04-30 NOTE — Telephone Encounter (Signed)
Please advise 

## 2016-04-30 NOTE — Telephone Encounter (Signed)
She can not take losartan because she is on diovan.  Not sure what why she wants a water pill - but we would need to discuss in an office visit.  How often does she take the potassium?  Does it help?  There are many causes for cramping and it may not always be low potassium that causes the cramping.  Taking too much potassium can be dangerous.  Ok to write letter as long as she is no longer having diarrhea.

## 2016-04-30 NOTE — Patient Instructions (Addendum)
ICD-9-CM ICD-10-CM   1. Acute bronchitis, unspecified organism 466.0 J20.9   2. Chronic obstructive pulmonary disease, unspecified COPD type ([place]) 496 J44.9     - Ideally you need antibiotics for current acute bronchitis which puts you at risk for COPD exacerbation - However, because he had a history of recurrent C. difficile as recently as May 2017 we have agreed that we will just watch your bronchitis and you  will call us if you got worse - Flu shot when you are over your bronchitis  Follow-up - Please call us if your bronchitis got worse or any other concerns from a breathing standpoint - Otherwise return in 3 months or sooner if needed

## 2016-04-30 NOTE — Progress Notes (Signed)
Subjective:     Patient ID: Mary Acosta, female   DOB: 12/12/1955, 60 y.o.   MRN: [id number]  HPI PCP Thressa Sheller, MD   HPI  IOV 09/26/2014  Chief Complaint  Patient presents with  . Pulmonary Consult    Pt referred by Dr. Sherald Barge for pneumonia.     60 year-old morbidly obese female with multiple medical problems including obesity, chronic pain, diabetes says has a long history of COPD not otherwise specified and quit smoking 4 years ago [close to 100 pack smoking history]. At baseline prior to December 2015 she says she was quite functional without much of dyspnea for activities of daily living. Then in mid November 2015 had an admission for hyperglycemia. This was followed by an admission 08/26/2014 08/29/2014 for hospital-acquired pneumonia based on a CT scan of the chest 08/26/2014 that showed right lower lobe infiltrate but ruled out pulmonary embolism. Readmitted 09/08/2014 through gender 17 2016 for COPD exacerbation. Since these admissions she says she is no longer as functional. ECOG functional status is now f4. She spends most of the day sitting or in bed. Only movement is to the bathroom and back to the bed and maybe to the dining room. These activities make extremely dyspneic that she still barely able to do her functional activities such as changing clothes or taking a shower. These activities make extremely dyspneic. Since discharge from the hospital she's not using her Spiriva because of chest tightness from Spiriva. In fact her husband says that she's not using any of her inhalers. She reports class III-for dyspnea associated with mild cough that is dry.  She's not had any PFTs in a long time according to her history  Chest x-ray 09/08/2014 lung fields are clear  She needed a wheel chair to get into ouroffice due to dyspnea  She is not on o2 at home.  Walking desaturation test 185 feet x 3 laps on RA: did only 3/4 of 1 lap. STOPPED 2 tims. Pulse ox stated at  100%. HR rose to 107/min  11/01/2014 Follow up PFT /COPD? Patient returns for a follow-up visit and review PFT Patient was seen one month ago for pulmonary consultation for possible COPD. She is a former smoker had a recent hospital. They should for hospital-acquired pneumonia , and COPD exacerbation. Patient had persistent shortness of breath. He had no desaturations with walking in office. PFTs today showed an FEV1 of 82%, ratio 81, FVC at 79%, no significant bronchodilator response, decreased diffusing capacity is 57%. Patient is on an ACE inhibitor, chronic narcotics, and is mildly obese with weight at 291. Patient had a CT chest in January 2016 was negative for pulmonary embolism, showed a right lower lobe pneumonia, follow-up chest x-ray was negative 2-D echo done in January showed severe LVH, EF 55% , GR 1 DD  Tried spiriva in past, intolerant.  Started on Duoneb feels it helps her breathing.  Says she was rechecked for sleep apnea in past  and test was neg.  Previous lap band with loss of 60lbs (high wt was 346) . Wt starting to tr up.  Has chronic pain .-back, knee  and FM  Started on PT last ov. Hard on her knees.  She denies any chest pain, orthopnea, PND, or increased leg swelling  OV  01/18/2015  Chief Complaint  Patient presents with  . Follow-up    Pt stated she is feeling better since sleeping with O2. Pt c/o DOE, weakness--pt requesting a wheelchair. Pt denies  CP/tightness and cough.     Follow-up mild COPD with chronic respiratory failure in the setting of obesity, chronic pain and poor functional status  Presents with her husband. She says that oxygen therapy has helped. She still got poor functional status due to obesity and pain. She wants a wheelchair. She is compliant with her oxygen and nebulizers. There is no new issues.    TEST  10/2014 PFTs today showed an FEV1 of 82%, ratio 81, FVC at 79%, no significant bronchodilator response, decreased diffusing capacity is  57%. 2-D echo done in January showed severe LVH, EF 55% , GR 1 DD  Tried spiriva in past, intolerant.  2016 ONO showed nocturnal desats she was started on O2  2016 sleep study which was neg for OSA.    10/23/2015 Follow up : CODP and Chronic O2  Pt returns for 9 month follow up for COPD .  Remains on Duoneb Four times a day   Says her cough and wheezing are much better since stopping Lisinopril last year.  Denies hemoptysis , chest pain, orthopnea, or increased edema.   Says she was recently dx with fatty liver/non alcoholic cirrhosis .  Has chronic pain on morphine and dilaudid. Followed [facility] pain [facility].  Flu and PVX utd.  Would like to get Prevnar vaccine today.    OV 04/30/2016  Chief Complaint  Patient presents with  . Follow-up    Pt reports her breathing is fair. C/o congestion, prod cough (green phlem).    Follow-up mild/moderate COPD but with high symptom burden because of multiple medical problems obesity and chronic opioid use  Last visit her in 2017 with nurse practitioner. Since then May 2017 she had recurrent C. difficile she apparently was a fifth episode. Overall she's stable but for the last week she is reporting increased cough compared to baseline and change in color of sputum to green color but no increased shortness of breath or wheeze or edema or chest pain or orthopnea or nocturnal dyspnea. This no fever or chills.    has a past medical history of Anxiety; Arthritis; Asthma; Cervical cancer ([place]); Chronic lower back pain; Chronic narcotic use; Chronic pain syndrome; COPD (chronic obstructive pulmonary disease) ([place]); Daily headache; Depression; DJD (degenerative joint disease); Fatty liver disease, nonalcoholic; Fatty liver disease, nonalcoholic; Fibromyalgia; Frequency of urination; HCAP (healthcare-associated pneumonia) (08/26/2014); History of TIA (transient ischemic attack) (11-01-2010); Hyperlipidemia; Hypertension; Hypothyroidism; IDDM (insulin dependent  diabetes mellitus) ([place]); Insomnia; Lumbar stenosis; Nocturia; OSA (obstructive sleep apnea); Osteoarthritis; Pneumonia ("several times"); Polymyalgia rheumatica ([place]); Scoliosis; Seasonal allergies; Thyroid cancer ([place]); Urgency of urination; and Vaginal pain (S/P SLING  FEB 2012).   reports that she quit smoking about 5 years ago. Her smoking use included Cigarettes. She has a 97.50 pack-year smoking history. She has never used smokeless tobacco.  Past Surgical History:  Procedure Laterality Date  . APPENDECTOMY  1982  . BREAST LUMPECTOMY Left 02-28-2005   ATYPICAL DUCTAL HYPERPLASIA  . CARDIAC CATHETERIZATION  09-04-2004   NORMAL CORONARY ANATOMY/ NORMAL LVF/ EF 60%  . CARDIOVASCULAR STRESS TEST  12-27-2010  DR [place]   ABNORMAL NUCLEAR STUDY W/ /MILD INFERIOR ISCHEMIA/ EF 69%/  CT HEART ANGIOGRAM ;  NO ACUTE FINDINGS  . CRYOABLATION  05/16/2003   w/LEEP FOR ABNORMAL PAP SMEAR  . CYSTOSCOPY  05/18/2012   Procedure: CYSTOSCOPY;  Surgeon: Reece Packer, MD;  Location[facility];  Service: Urology;  Laterality: N/A;  examination under anethesia  . HYSTEROSCOPY W/D&C  08-19-2007  PMB  . KNEE ARTHROSCOPY W/ DEBRIDEMENT Left 03/29/2006   INTERNAL DERANGEMENT/ SEVERE DJD/ MENISCUS TEARS  . LAPAROSCOPIC CHOLECYSTECTOMY  06-10-2002  . LAPAROSCOPIC GASTRIC BANDING  03/01/2006   TRUNCAL VAGOTOMY/ PLACEMENT OF VG BAND  . REVISION TOTAL KNEE ARTHROPLASTY Left 08-29-2008; 05/2009  . TONSILLECTOMY  1969  . TOTAL KNEE ARTHROPLASTY Left 01-23-2007   SEVERE DJD  . TOTAL THYROIDECTOMY  11-22-2005   BILATERAL THYROID NODULES-- PAPILLARY CARCINOMA (0.5CM)/ ADENOMATOID NODULES  . TRANSTHORACIC ECHOCARDIOGRAM  12-27-2010   LVSF NORMAL / EF XX123456 GRADE I DIASTOLIC DYSFUNCTION/ MILD MITRAL REGURG. / MILDLY DILATED LEFT ATRIUM/ MILDY INCREASED SYSTOLIC PRESSURE OF PULMONARY ARTERIES  . TRANSVAGINAL SUBURETERAL TAPE/ SLING  09-28-2010   MIXED URINARY INCONTINENCE  . TUBAL LIGATION   1983    Allergies  Allergen Reactions  . Gabapentin Swelling    Swelling in legs  . Losartan Other (See Comments)    Myalgias and muscle cramping  . Oxycodone Itching  . Sulfa Antibiotics Nausea Only and Rash  . Sulfonamide Derivatives Nausea Only    Immunization History  Administered Date(s) Administered  . Influenza,inj,Quad PF,36+ Mos 05/27/2015  . Influenza-Unspecified 05/26/2014  . Pneumococcal Conjugate-13 10/23/2015  . Pneumococcal Polysaccharide-23 07/10/2014    Family History  Problem Relation Age of Onset  . Diabetes Mother   . Heart disease Mother   . Heart disease Father   . Colon cancer Maternal Uncle     x 2  . Breast cancer Other     great aunts x 5     Current Outpatient Prescriptions:  .  acetaminophen (TYLENOL) 500 MG tablet, Take 500 mg by mouth every 4 (four) hours as needed for moderate pain., Disp: , Rfl:  .  albuterol (PROVENTIL HFA;VENTOLIN HFA) 108 (90 BASE) MCG/ACT inhaler, Inhale 2 puffs into the lungs every 6 (six) hours as needed for wheezing or shortness of breath., Disp: 1 Inhaler, Rfl: 2 .  amitriptyline (ELAVIL) 50 MG tablet, Take 100 mg by mouth daily. , Disp: , Rfl: 5 .  cetirizine (ZYRTEC) 10 MG tablet, Take 10 mg by mouth daily as needed for allergies. , Disp: , Rfl:  .  HYDROmorphone (DILAUDID) 4 MG tablet, Take 4 mg by mouth every 6 (six) hours as needed for moderate pain. , Disp: , Rfl: 0 .  insulin NPH Human (NOVOLIN N) 100 UNIT/ML injection, Inject 0.4 mLs (40 Units total) into the skin at bedtime., Disp: 20 mL, Rfl: 11 .  insulin regular (NOVOLIN R) 100 units/mL injection, Inject 0.3 mLs (30 Units total) into the skin 3 (three) times daily before meals., Disp: 30 mL, Rfl: 2 .  Insulin Syringe-Needle U-100 (INSULIN SYRINGE 1CC/30GX5/16") 30G X 5/16" 1 ML MISC, Use as directed for insulin injections 5 times daily, Disp: 150 each, Rfl: 1 .  ipratropium-albuterol (DUONEB) 0.5-2.5 (3) MG/3ML SOLN, Take 3 mLs by nebulization 4 (four)  times daily. (Patient taking differently: Take 3 mLs by nebulization every 6 (six) hours as needed. ), Disp: 360 mL, Rfl: 11 .  levothyroxine (SYNTHROID, LEVOTHROID) 150 MCG tablet, Take 150 mcg by mouth daily before breakfast., Disp: , Rfl:  .  Melatonin 5 MG CAPS, Take 5 mg by mouth at bedtime as needed (sleep). , Disp: , Rfl:  .  meloxicam (MOBIC) 7.5 MG tablet, Take 7.5 mg by mouth daily as needed for pain. , Disp: , Rfl: 5 .  morphine (MS CONTIN) 30 MG 12 hr tablet, Take 30 mg by mouth 2 (two) times daily., Disp: , Rfl:  .  ondansetron (ZOFRAN) 8 MG tablet, Take 1 tablet (8 mg total) by mouth every 8 (eight) hours as needed for nausea or vomiting., Disp: 60 tablet, Rfl: 1 .  polyethylene glycol (MIRALAX / GLYCOLAX) packet, Take 17 g by mouth 2 (two) times daily as needed for moderate constipation., Disp: 14 each, Rfl: 0 .  potassium chloride SA (K-DUR,KLOR-CON) 20 MEQ tablet, Take 40 mEq by mouth daily as needed (for cramping). , Disp: , Rfl: 1 .  tiZANidine (ZANAFLEX) 2 MG tablet, Take 2 mg by mouth 3 (three) times daily., Disp: , Rfl: 2 .  valsartan (DIOVAN) 80 MG tablet, Take 1 tablet (80 mg total) by mouth daily., Disp: , Rfl:  .  vancomycin (VANCOCIN) 50 mg/mL oral solution, Take 125 mg QID x 2 weeks, then 125 mg TID x 7 days, then 125 mg QD x 7 days, then 125 mg QOD x 2 weeks, Disp: 140 mL, Rfl: 0 .  ALPRAZolam (XANAX) 0.5 MG tablet, Take 1 hour prior to appointment, may repeat x 1 if needed (Patient not taking: Reported on 04/11/2016), Disp: 2 tablet, Rfl: 0     Review of Systems     Objective:   Physical Exam  Constitutional: She is oriented to person, place, and time. She appears well-developed and well-nourished. No distress.  Obese Deconditioned looking  HENT:  Head: Normocephalic and atraumatic.  Right Ear: External ear normal.  Left Ear: External ear normal.  Mouth/Throat: Oropharynx is clear and moist. No oropharyngeal exudate.  Eyes: Conjunctivae and EOM are normal.  Pupils are equal, round, and reactive to light. Right eye exhibits no discharge. Left eye exhibits no discharge. No scleral icterus.  Neck: Normal range of motion. Neck supple. No JVD present. No tracheal deviation present. No thyromegaly present.  Cardiovascular: Normal rate, regular rhythm, normal heart sounds and intact distal pulses.  Exam reveals no gallop and no friction rub.   No murmur heard. Pulmonary/Chest: Effort normal and breath sounds normal. No respiratory distress. She has no wheezes. She has no rales. She exhibits no tenderness.  No respiratory distress or wheeze   Abdominal: Soft. Bowel sounds are normal. She exhibits no distension and no mass. There is no tenderness. There is no rebound and no guarding.  Visceral obesity present  Musculoskeletal: Normal range of motion. She exhibits no edema or tenderness.  Lymphadenopathy:    She has no cervical adenopathy.  Neurological: She is alert and oriented to person, place, and time. She has normal reflexes. No cranial nerve deficit. She exhibits normal muscle tone. Coordination normal.  Skin: Skin is warm and dry. No rash noted. She is not diaphoretic. No erythema. No pallor.  Psychiatric: She has a normal mood and affect. Her behavior is normal. Judgment and thought content normal.  Vitals reviewed.    Vitals:   04/30/16 1102  BP: 124/76  Pulse: 89  SpO2: 96%  Weight: 278 lb 9.6 oz (126.4 kg)  Height: 5\' 9"  (1.753 m)    Body mass index is 41.14 kg/m.     Assessment:       ICD-9-CM ICD-10-CM   1. Acute bronchitis, unspecified organism 466.0 J20.9   2. Chronic obstructive pulmonary disease, unspecified COPD type ([place]) 496 J44.9    She has early onset of COPD exacerbation in the form of acute bronchitis. Anabiotic would be indicated but she has history of recurrent C. difficile. Therefore we discussed an operative distal follow this expectantly. She will call us if it is worse. She feels that because  her mother died  from C diff and she herself has had 5 or 6 episodes she doesn't want to do anything to increase the risk of C. difficile    Plan:       - Ideally you need antibiotics for current acute bronchitis which puts you at risk for COPD exacerbation - However, because he had a history of recurrent C. difficile as recently as May 2017 we have agreed that we will just watch your bronchitis and you  will call us if you got worse - Flu shot when you are over your bronchitis  Follow-up - Please call us if your bronchitis got worse or any other concerns from a breathing standpoint - Otherwise return in 3 months or sooner if needed   Dr. Brand Males, M.D., [facility].C.P Pulmonary and Critical Care Medicine Staff Physician [facility] Pager: [phone], If no answer or between  15:00h - 7:00h: call [phone]  04/30/2016 11:17 AM

## 2016-04-30 NOTE — Telephone Encounter (Signed)
Pt stating she need new Losartan that does have water pill in it send to pharmacy and also Potassium(she only take this PRN for cramping).   Pt also request note/letter for her pain clinic stating she is no longer contagious from C-Diff Dr. Quay Burow has been treating her for. Please call pt  Pharmacy: [facility] on [email or web address]

## 2016-05-01 NOTE — Telephone Encounter (Signed)
Ok to send potassium.     Send diovan in chart - that does not have the water pill.     Ok to send letter stating she has been treated for cdiff by ID.

## 2016-05-01 NOTE — Telephone Encounter (Signed)
Spoke with pt, she states that she is currently taking Valsartan HCTZ 120 mg, her med list states she should be taking Valsartan 80 mg.   Diarrhea- States she has all day one day and doesn't have a bowel movement 3-4 days after.   Potassium- Was prescribed 51 in Sept 2016. Takes roughly 1 time weekly (1-3 20 meq tablets) for cramping. Does feel that it helps.   Please advise of changes and if pt can be cleared to go back to pain management.

## 2016-05-02 ENCOUNTER — Encounter: Payer: Self-pay | Admitting: Emergency Medicine

## 2016-05-02 ENCOUNTER — Other Ambulatory Visit: Payer: Self-pay | Admitting: Internal Medicine

## 2016-05-02 DIAGNOSIS — F331 Major depressive disorder, recurrent, moderate: Secondary | ICD-10-CM | POA: Diagnosis not present

## 2016-05-02 DIAGNOSIS — F411 Generalized anxiety disorder: Secondary | ICD-10-CM | POA: Diagnosis not present

## 2016-05-02 MED ORDER — POTASSIUM CHLORIDE CRYS ER 20 MEQ PO TBCR: 40.0000 meq | EXTENDED_RELEASE_TABLET | Freq: Every day | ORAL | 1 refills | Status: DC | PRN

## 2016-05-02 MED ORDER — VALSARTAN 80 MG PO TABS: 80.0000 mg | ORAL_TABLET | Freq: Every day | ORAL | 1 refills | Status: DC

## 2016-05-02 NOTE — Telephone Encounter (Signed)
RXs sent to [facility], letter faxed to Dr Levora Angel, with [facility] management.

## 2016-05-03 ENCOUNTER — Ambulatory Visit (INDEPENDENT_AMBULATORY_CARE_PROVIDER_SITE_OTHER): Payer: Medicare Other | Admitting: Podiatry

## 2016-05-03 ENCOUNTER — Ambulatory Visit: Payer: Medicare Other

## 2016-05-03 ENCOUNTER — Ambulatory Visit (INDEPENDENT_AMBULATORY_CARE_PROVIDER_SITE_OTHER): Payer: Medicare Other

## 2016-05-03 VITALS — BP 143/74 | HR 86 | Resp 16 | Ht 69.0 in | Wt 278.0 lb

## 2016-05-03 DIAGNOSIS — M79672 Pain in left foot: Secondary | ICD-10-CM | POA: Diagnosis not present

## 2016-05-03 DIAGNOSIS — Q828 Other specified congenital malformations of skin: Secondary | ICD-10-CM | POA: Diagnosis not present

## 2016-05-03 DIAGNOSIS — M2040 Other hammer toe(s) (acquired), unspecified foot: Secondary | ICD-10-CM

## 2016-05-03 DIAGNOSIS — E1149 Type 2 diabetes mellitus with other diabetic neurological complication: Secondary | ICD-10-CM

## 2016-05-03 DIAGNOSIS — M79671 Pain in right foot: Secondary | ICD-10-CM | POA: Diagnosis not present

## 2016-05-03 DIAGNOSIS — E1140 Type 2 diabetes mellitus with diabetic neuropathy, unspecified: Secondary | ICD-10-CM

## 2016-05-03 NOTE — Progress Notes (Signed)
   Subjective:    Patient ID: Mary Acosta, female    DOB: Aug 20, 1956, 60 y.o.   MRN: [id number]  HPI    Review of Systems  Constitutional: Positive for appetite change and fatigue.  Eyes: Positive for visual disturbance.  Respiratory: Positive for shortness of breath.   Cardiovascular: Positive for leg swelling.  Endocrine: Positive for cold intolerance.  Musculoskeletal: Positive for back pain and joint swelling.  Neurological: Positive for weakness and numbness.  Psychiatric/Behavioral: The patient is nervous/anxious.        Objective:   Physical Exam        Assessment & Plan:

## 2016-05-05 NOTE — Progress Notes (Signed)
Subjective:     Patient ID: Mary Acosta, female   DOB: September 12, 1955, 60 y.o.   MRN: [id number]  HPI patient presents with obesity and moderate pain in both feet with lesion formation that's painful left and she cannot cut herself   Review of Systems  All other systems reviewed and are negative.      Objective:   Physical Exam  Constitutional: She is oriented to person, place, and time.  Cardiovascular: Intact distal pulses.   Musculoskeletal: Normal range of motion.  Neurological: She is oriented to person, place, and time.  Skin: Skin is warm and dry.  Nursing note and vitals reviewed.  patient presents with lesion formation and is noted to have moderate swelling in her feet with flatness of the arch and inflammation noted. Patient has poor health does have diminishment of sharp [employer] vibratory and is a long-term diabetic     Assessment:     At risk diabetic with lesion formation structural changes and neuropathic like condition    Plan:     H&P x-rays and all conditions reviewed and debridement accomplished with long-term diabetic shoes recommended for this patient do to the risk factors. Reappoint for [place] to recheck and was given also education on daily inspections  X-ray report indicates depression of the arch edema and no indications of Charcot type condition

## 2016-05-07 ENCOUNTER — Telehealth: Payer: Self-pay | Admitting: Internal Medicine

## 2016-05-07 MED ORDER — CEFDINIR 300 MG PO CAPS: 300.0000 mg | ORAL_CAPSULE | Freq: Two times a day (BID) | ORAL | 0 refills | Status: DC

## 2016-05-07 MED ORDER — PREDNISONE 10 MG PO TABS: ORAL_TABLET | ORAL | 0 refills | Status: DC

## 2016-05-07 NOTE — Telephone Encounter (Signed)
Spoke with MR - abx rx is to be Omnicef 300mg  BID x 5 days.    Called and spoke to pt. Informed her of the recs per MR. Omnicef and prednisone sent to preferred pharmacy. Pt verbalized understanding and denied any further questions or concerns at this time.

## 2016-05-07 NOTE — Telephone Encounter (Signed)
[facility]  Cephalexin 300mg  bid x 5 days  Please take prednisone 40 mg x1 day, then 30 mg x1 day, then 20 mg x1 day, then 10 mg x1 day, and then 5 mg x1 day and stop   Allergies  Allergen Reactions  . Gabapentin Swelling    Swelling in legs  . Losartan Other (See Comments)    Myalgias and muscle cramping  . Oxycodone Itching  . Sulfa Antibiotics Nausea Only and Rash  . Sulfonamide Derivatives Nausea Only

## 2016-05-07 NOTE — Telephone Encounter (Signed)
Spoke with pt. States that her symptoms are worse since seeing MR last. Reports cough, SOB, chest tightness, wheezing. Cough is producing green mucus. Denies fever. Pt would like to have a prednisone prescription sent in.  MR - please advise. Thanks.

## 2016-05-08 ENCOUNTER — Ambulatory Visit: Payer: Medicare Other | Admitting: Endocrinology

## 2016-05-16 ENCOUNTER — Encounter: Payer: Self-pay | Admitting: Endocrinology

## 2016-05-16 ENCOUNTER — Ambulatory Visit (INDEPENDENT_AMBULATORY_CARE_PROVIDER_SITE_OTHER): Payer: Medicare Other | Admitting: Endocrinology

## 2016-05-16 VITALS — BP 136/86 | HR 81 | Wt 283.0 lb

## 2016-05-16 DIAGNOSIS — E1042 Type 1 diabetes mellitus with diabetic polyneuropathy: Secondary | ICD-10-CM | POA: Diagnosis not present

## 2016-05-16 LAB — POCT GLYCOSYLATED HEMOGLOBIN (HGB A1C): Hemoglobin A1C: 12.4

## 2016-05-16 MED ORDER — INSULIN NPH (HUMAN) (ISOPHANE) 100 UNIT/ML ~~LOC~~ SUSP: 100.0000 [IU] | SUBCUTANEOUS | 11 refills | Status: DC

## 2016-05-16 NOTE — Patient Instructions (Addendum)
check your blood sugar twice a day.  vary the time of day when you check, between before the 3 meals, and at bedtime.  also check if you have symptoms of your blood sugar being too high or too low.  please keep a record of the readings and bring it to your next appointment here (or you can bring the meter itself).  You can write it on any piece of paper.  please call us sooner if your blood sugar goes below 70, or if you have a lot of readings over 200. For now:  Stop taking the reg insulin, and:  increase the NPH to 100 units each morning.  Please come back for a follow-up appointment in 2 weeks.  Please call us next week, to tell us how the blood sugar is doing.

## 2016-05-16 NOTE — Progress Notes (Signed)
Subjective:    Patient ID: Mary Acosta, female    DOB: 12/10/1955, 60 y.o.   MRN: [id number]  HPI  Pt returns for f/u of diabetes mellitus: DM type: 1 Dx'ed: [id number] Complications: polyneuropathy and retinopathy Therapy: insulin since 2011 GDM: never DKA: once, in 2015 Severe hypoglycemia: never Pancreatitis: never Other: she had gastric banding in 2008.  She then lost 148 lbs, but  has since regained approx half of that; she takes human insulin, due to cos  Interval history: Pt says her ability to care for her DM is being compromised by several recurrent illnesses.  She has been off prednisone x approx 10 days.  no cbg record, but states prior to the prednisone, cbg's varied from 200-350.  She says she is having a difficult time remembering to take multiple daily injections, and her other meds.  She recently saw psychiatry, and she feels better since then.   Past Medical History:  Diagnosis Date  . Anxiety    with panic attacks  . Arthritis    "back; feet; hands; shoulders" (08/26/2014)  . Asthma   . Cervical cancer ([place])   . Chronic lower back pain   . Chronic narcotic use   . Chronic pain syndrome    [facility]  . COPD (chronic obstructive pulmonary disease) ([place])   . Daily headache   . Depression   . DJD (degenerative joint disease)   . Fatty liver disease, nonalcoholic   . Fatty liver disease, nonalcoholic   . Fibromyalgia   . Frequency of urination   . HCAP (healthcare-associated pneumonia) 08/26/2014  . History of TIA (transient ischemic attack) 11-01-2010   NO RESIDUAL  . Hyperlipidemia   . Hypertension   . Hypothyroidism   . IDDM (insulin dependent diabetes mellitus) ([place])   . Insomnia   . Lumbar stenosis   . Nocturia   . OSA (obstructive sleep apnea)    NO CPAP SINCE WT LOSS  . Osteoarthritis    with severe disease in knee  . Pneumonia "several times"  . Polymyalgia rheumatica ([place])   . Scoliosis   . Seasonal allergies   . Thyroid  cancer ([place])   . Urgency of urination   . Vaginal pain S/P SLING  FEB 2012    Past Surgical History:  Procedure Laterality Date  . APPENDECTOMY  1982  . BREAST LUMPECTOMY Left 02-28-2005   ATYPICAL DUCTAL HYPERPLASIA  . CARDIAC CATHETERIZATION  09-04-2004   NORMAL CORONARY ANATOMY/ NORMAL LVF/ EF 60%  . CARDIOVASCULAR STRESS TEST  12-27-2010  DR [place]   ABNORMAL NUCLEAR STUDY W/ /MILD INFERIOR ISCHEMIA/ EF 69%/  CT HEART ANGIOGRAM ;  NO ACUTE FINDINGS  . CRYOABLATION  05/16/2003   w/LEEP FOR ABNORMAL PAP SMEAR  . CYSTOSCOPY  05/18/2012   Procedure: CYSTOSCOPY;  Surgeon: Reece Packer, MD;  Location: [facility];  Service: Urology;  Laterality: N/A;  examination under anethesia  . HYSTEROSCOPY W/D&C  08-19-2007   PMB  . KNEE ARTHROSCOPY W/ DEBRIDEMENT Left 03/29/2006   INTERNAL DERANGEMENT/ SEVERE DJD/ MENISCUS TEARS  . LAPAROSCOPIC CHOLECYSTECTOMY  06-10-2002  . LAPAROSCOPIC GASTRIC BANDING  03/01/2006   TRUNCAL VAGOTOMY/ PLACEMENT OF VG BAND  . REVISION TOTAL KNEE ARTHROPLASTY Left 08-29-2008; 05/2009  . TONSILLECTOMY  1969  . TOTAL KNEE ARTHROPLASTY Left 01-23-2007   SEVERE DJD  . TOTAL THYROIDECTOMY  11-22-2005   BILATERAL THYROID NODULES-- PAPILLARY CARCINOMA (0.5CM)/ ADENOMATOID NODULES  . TRANSTHORACIC ECHOCARDIOGRAM  12-27-2010  LVSF NORMAL / EF XX123456 GRADE I DIASTOLIC DYSFUNCTION/ MILD MITRAL REGURG. / MILDLY DILATED LEFT ATRIUM/ MILDY INCREASED SYSTOLIC PRESSURE OF PULMONARY ARTERIES  . TRANSVAGINAL SUBURETERAL TAPE/ SLING  09-28-2010   MIXED URINARY INCONTINENCE  . TUBAL LIGATION  1983    Social History   Social History  . Marital status: Married    Spouse name: N/A  . Number of children: 2  . Years of education: N/A   Occupational History  . disabled Unemployed   Social History Main Topics  . Smoking status: Former Smoker    Packs/day: 2.50    Years: 39.00    Types: Cigarettes    Quit date: 10/22/2010  . Smokeless tobacco: Never  Used  . Alcohol use No  . Drug use: No  . Sexual activity: Not Currently   Other Topics Concern  . Not on file   Social History Narrative  . No narrative on file    Current Outpatient Prescriptions on File Prior to Visit  Medication Sig Dispense Refill  . acetaminophen (TYLENOL) 500 MG tablet Take 500 mg by mouth every 4 (four) hours as needed for moderate pain.    Marland Kitchen albuterol (PROVENTIL HFA;VENTOLIN HFA) 108 (90 BASE) MCG/ACT inhaler Inhale 2 puffs into the lungs every 6 (six) hours as needed for wheezing or shortness of breath. 1 Inhaler 2  . ALPRAZolam (XANAX) 0.5 MG tablet Take 1 hour prior to appointment, may repeat x 1 if needed 2 tablet 0  . amitriptyline (ELAVIL) 50 MG tablet Take 100 mg by mouth daily.   5  . cefdinir (OMNICEF) 300 MG capsule Take 1 capsule (300 mg total) by mouth 2 (two) times daily. 10 capsule 0  . cetirizine (ZYRTEC) 10 MG tablet Take 10 mg by mouth daily as needed for allergies.     Marland Kitchen desvenlafaxine (PRISTIQ) 50 MG 24 hr tablet     . HYDROmorphone (DILAUDID) 4 MG tablet Take 4 mg by mouth every 6 (six) hours as needed for moderate pain.   0  . hydrOXYzine (ATARAX/VISTARIL) 25 MG tablet     . Insulin Syringe-Needle U-100 (INSULIN SYRINGE 1CC/30GX5/16") 30G X 5/16" 1 ML MISC Use as directed for insulin injections 5 times daily 150 each 1  . ipratropium-albuterol (DUONEB) 0.5-2.5 (3) MG/3ML SOLN Take 3 mLs by nebulization 4 (four) times daily. (Patient taking differently: Take 3 mLs by nebulization every 6 (six) hours as needed. ) 360 mL 11  . levothyroxine (SYNTHROID, LEVOTHROID) 150 MCG tablet Take 150 mcg by mouth daily before breakfast.    . Melatonin 5 MG CAPS Take 5 mg by mouth at bedtime as needed (sleep).     . meloxicam (MOBIC) 7.5 MG tablet Take 7.5 mg by mouth daily as needed for pain.   5  . morphine (MS CONTIN) 30 MG 12 hr tablet Take 30 mg by mouth 2 (two) times daily.    . ondansetron (ZOFRAN) 8 MG tablet Take 1 tablet (8 mg total) by mouth every  8 (eight) hours as needed for nausea or vomiting. 60 tablet 1  . polyethylene glycol (MIRALAX / GLYCOLAX) packet Take 17 g by mouth 2 (two) times daily as needed for moderate constipation. 14 each 0  . potassium chloride SA (K-DUR,KLOR-CON) 20 MEQ tablet Take 2 tablets by mouth daily as needed for cramping. 180 tablet 0  . predniSONE (DELTASONE) 10 MG tablet 40 mg x1 day, then 30 mg x1 day, then 20 mg x1 day, then 10 mg x1 day, and then  5 mg x1 day and stop 11 tablet 0  . tiZANidine (ZANAFLEX) 2 MG tablet Take 2 mg by mouth 3 (three) times daily.  2  . valsartan (DIOVAN) 80 MG tablet Take 1 tablet (80 mg total) by mouth daily. 90 tablet 1  . vancomycin (VANCOCIN) 50 mg/mL oral solution Take 125 mg QID x 2 weeks, then 125 mg TID x 7 days, then 125 mg QD x 7 days, then 125 mg QOD x 2 weeks 140 mL 0   No current facility-administered medications on file prior to visit.     Allergies  Allergen Reactions  . Gabapentin Swelling    Swelling in legs  . Losartan Other (See Comments)    Myalgias and muscle cramping  . Oxycodone Itching  . Sulfa Antibiotics Nausea Only and Rash  . Sulfonamide Derivatives Nausea Only    Family History  Problem Relation Age of Onset  . Diabetes Mother   . Heart disease Mother   . Heart disease Father   . Colon cancer Maternal Uncle     x 2  . Breast cancer Other     great aunts x 5    BP 136/86   Pulse 81   Wt 283 lb (128.4 kg)   SpO2 95%   BMI 41.79 kg/m   Review of Systems She has difficulty with concentration.     Objective:   Physical Exam VITAL SIGNS:  See vs page GENERAL: no distress.  Morbid obesity.  Pulses: dorsalis pedis intact bilat.   MSK: no deformity of the feet CV: 1+ bilat leg leg edema, and bilat vv's Skin:  no ulcer on the feet.  normal color and temp on the feet. Neuro: sensation is intact to touch on the feet Ext: There is bilateral onychomycosis of the toenails.     Assessment & Plan:  Type 1 DM, worse.  We discussed rx  options.  She wants to go off multiple daily injections PMR: steroid rx is worsening glycemic control, but she is off now.   Depression, improved on rx.

## 2016-05-17 DIAGNOSIS — Z0289 Encounter for other administrative examinations: Secondary | ICD-10-CM | POA: Diagnosis not present

## 2016-05-17 DIAGNOSIS — M533 Sacrococcygeal disorders, not elsewhere classified: Secondary | ICD-10-CM | POA: Diagnosis not present

## 2016-05-17 DIAGNOSIS — Z5181 Encounter for therapeutic drug level monitoring: Secondary | ICD-10-CM | POA: Diagnosis not present

## 2016-05-17 DIAGNOSIS — M5137 Other intervertebral disc degeneration, lumbosacral region: Secondary | ICD-10-CM | POA: Diagnosis not present

## 2016-05-17 DIAGNOSIS — G894 Chronic pain syndrome: Secondary | ICD-10-CM | POA: Diagnosis not present

## 2016-05-28 DIAGNOSIS — F331 Major depressive disorder, recurrent, moderate: Secondary | ICD-10-CM | POA: Diagnosis not present

## 2016-05-28 DIAGNOSIS — F411 Generalized anxiety disorder: Secondary | ICD-10-CM | POA: Diagnosis not present

## 2016-05-29 DIAGNOSIS — H5203 Hypermetropia, bilateral: Secondary | ICD-10-CM | POA: Diagnosis not present

## 2016-05-29 DIAGNOSIS — E119 Type 2 diabetes mellitus without complications: Secondary | ICD-10-CM | POA: Diagnosis not present

## 2016-05-29 LAB — HM DIABETES EYE EXAM

## 2016-05-30 ENCOUNTER — Encounter: Payer: Self-pay | Admitting: Endocrinology

## 2016-05-30 ENCOUNTER — Ambulatory Visit (INDEPENDENT_AMBULATORY_CARE_PROVIDER_SITE_OTHER): Payer: Medicare Other | Admitting: Endocrinology

## 2016-05-30 VITALS — BP 128/82 | HR 85 | Ht 69.0 in | Wt 283.0 lb

## 2016-05-30 DIAGNOSIS — Z23 Encounter for immunization: Secondary | ICD-10-CM

## 2016-05-30 MED ORDER — INSULIN NPH (HUMAN) (ISOPHANE) 100 UNIT/ML ~~LOC~~ SUSP: 120.0000 [IU] | SUBCUTANEOUS | 11 refills | Status: DC

## 2016-05-30 NOTE — Patient Instructions (Addendum)
check your blood sugar twice a day.  vary the time of day when you check, between before the 3 meals, and at bedtime.  also check if you have symptoms of your blood sugar being too high or too low.  please keep a record of the readings and bring it to your next appointment here (or you can bring the meter itself).  You can write it on any piece of paper.  please call us sooner if your blood sugar goes below 70, or if you have a lot of readings over 200. increase the NPH to 120 units each morning.  Please come back for a follow-up appointment in 2-3 weeks.

## 2016-05-30 NOTE — Progress Notes (Signed)
Subjective:    Patient ID: Mary Acosta, female    DOB: 1956/04/02, 60 y.o.   MRN: [id number]  HPI Pt returns for f/u of diabetes mellitus: DM type: 1 Dx'ed: [id number] Complications: polyneuropathy and retinopathy Therapy: insulin since 2011 GDM: never DKA: once, in 2015 Severe hypoglycemia: never Pancreatitis: never Other: she had gastric banding in 2008.  She then lost 148 lbs, but  has since regained approx half of that; she takes human insulin, due to cost; she takes qd insulin, due to noncompliance with multiple daily injections   Interval history: no cbg record, but states cbg's vary 200-325.  She says she never misses the insulin. pt states she feels better in general.   Past Medical History:  Diagnosis Date  . Anxiety    with panic attacks  . Arthritis    "back; feet; hands; shoulders" (08/26/2014)  . Asthma   . Cervical cancer ([name])   . Chronic lower back pain   . Chronic narcotic use   . Chronic pain syndrome    [facility]  . COPD (chronic obstructive pulmonary disease) ([place])   . Daily headache   . Depression   . DJD (degenerative joint disease)   . Fatty liver disease, nonalcoholic   . Fatty liver disease, nonalcoholic   . Fibromyalgia   . Frequency of urination   . HCAP (healthcare-associated pneumonia) 08/26/2014  . History of TIA (transient ischemic attack) 11-01-2010   NO RESIDUAL  . Hyperlipidemia   . Hypertension   . Hypothyroidism   . IDDM (insulin dependent diabetes mellitus) ([place])   . Insomnia   . Lumbar stenosis   . Nocturia   . OSA (obstructive sleep apnea)    NO CPAP SINCE WT LOSS  . Osteoarthritis    with severe disease in knee  . Pneumonia "several times"  . Polymyalgia rheumatica ([place])   . Scoliosis   . Seasonal allergies   . Thyroid cancer ([place])   . Urgency of urination   . Vaginal pain S/P SLING  FEB 2012    Past Surgical History:  Procedure Laterality Date  . APPENDECTOMY  1982  . BREAST LUMPECTOMY Left  02-28-2005   ATYPICAL DUCTAL HYPERPLASIA  . CARDIAC CATHETERIZATION  09-04-2004   NORMAL CORONARY ANATOMY/ NORMAL LVF/ EF 60%  . CARDIOVASCULAR STRESS TEST  12-27-2010  DR [place]   ABNORMAL NUCLEAR STUDY W/ /MILD INFERIOR ISCHEMIA/ EF 69%/  CT HEART ANGIOGRAM ;  NO ACUTE FINDINGS  . CRYOABLATION  05/16/2003   w/LEEP FOR ABNORMAL PAP SMEAR  . CYSTOSCOPY  05/18/2012   Procedure: CYSTOSCOPY;  Surgeon: Reece Packer, MD;  [facility]: [facility];  Service: Urology;  Laterality: N/A;  examination under anethesia  . HYSTEROSCOPY W/D&C  08-19-2007   PMB  . KNEE ARTHROSCOPY W/ DEBRIDEMENT Left 03/29/2006   INTERNAL DERANGEMENT/ SEVERE DJD/ MENISCUS TEARS  . LAPAROSCOPIC CHOLECYSTECTOMY  06-10-2002  . LAPAROSCOPIC GASTRIC BANDING  03/01/2006   TRUNCAL VAGOTOMY/ PLACEMENT OF VG BAND  . REVISION TOTAL KNEE ARTHROPLASTY Left 08-29-2008; 05/2009  . TONSILLECTOMY  1969  . TOTAL KNEE ARTHROPLASTY Left 01-23-2007   SEVERE DJD  . TOTAL THYROIDECTOMY  11-22-2005   BILATERAL THYROID NODULES-- PAPILLARY CARCINOMA (0.5CM)/ ADENOMATOID NODULES  . TRANSTHORACIC ECHOCARDIOGRAM  12-27-2010   LVSF NORMAL / EF XX123456 GRADE I DIASTOLIC DYSFUNCTION/ MILD MITRAL REGURG. / MILDLY DILATED LEFT ATRIUM/ MILDY INCREASED SYSTOLIC PRESSURE OF PULMONARY ARTERIES  . TRANSVAGINAL SUBURETERAL TAPE/ SLING  09-28-2010   MIXED  URINARY INCONTINENCE  . TUBAL LIGATION  1983    Social History   Social History  . Marital status: Married    Spouse name: N/A  . Number of children: 2  . Years of education: N/A   Occupational History  . disabled Unemployed   Social History Main Topics  . Smoking status: Former Smoker    Packs/day: 2.50    Years: 39.00    Types: Cigarettes    Quit date: 10/22/2010  . Smokeless tobacco: Never Used  . Alcohol use No  . Drug use: No  . Sexual activity: Not Currently   Other Topics Concern  . Not on file   Social History Narrative  . No narrative on file    Current  Outpatient Prescriptions on File Prior to Visit  Medication Sig Dispense Refill  . acetaminophen (TYLENOL) 500 MG tablet Take 500 mg by mouth every 4 (four) hours as needed for moderate pain.    Marland Kitchen albuterol (PROVENTIL HFA;VENTOLIN HFA) 108 (90 BASE) MCG/ACT inhaler Inhale 2 puffs into the lungs every 6 (six) hours as needed for wheezing or shortness of breath. 1 Inhaler 2  . cefdinir (OMNICEF) 300 MG capsule Take 1 capsule (300 mg total) by mouth 2 (two) times daily. 10 capsule 0  . cetirizine (ZYRTEC) 10 MG tablet Take 10 mg by mouth daily as needed for allergies.     Marland Kitchen desvenlafaxine (PRISTIQ) 50 MG 24 hr tablet     . HYDROmorphone (DILAUDID) 4 MG tablet Take 4 mg by mouth every 6 (six) hours as needed for moderate pain.   0  . hydrOXYzine (ATARAX/VISTARIL) 25 MG tablet 25 mg 2 (two) times daily.     . Insulin Syringe-Needle U-100 (INSULIN SYRINGE 1CC/30GX5/16") 30G X 5/16" 1 ML MISC Use as directed for insulin injections 5 times daily 150 each 1  . ipratropium-albuterol (DUONEB) 0.5-2.5 (3) MG/3ML SOLN Take 3 mLs by nebulization 4 (four) times daily. (Patient taking differently: Take 3 mLs by nebulization every 6 (six) hours as needed. ) 360 mL 11  . levothyroxine (SYNTHROID, LEVOTHROID) 150 MCG tablet Take 150 mcg by mouth daily before breakfast.    . Melatonin 5 MG CAPS Take 5 mg by mouth at bedtime as needed (sleep).     . meloxicam (MOBIC) 7.5 MG tablet Take 7.5 mg by mouth daily as needed for pain.   5  . morphine (MS CONTIN) 30 MG 12 hr tablet Take 30 mg by mouth 2 (two) times daily.    . ondansetron (ZOFRAN) 8 MG tablet Take 1 tablet (8 mg total) by mouth every 8 (eight) hours as needed for nausea or vomiting. 60 tablet 1  . polyethylene glycol (MIRALAX / GLYCOLAX) packet Take 17 g by mouth 2 (two) times daily as needed for moderate constipation. 14 each 0  . potassium chloride SA (K-DUR,KLOR-CON) 20 MEQ tablet Take 2 tablets by mouth daily as needed for cramping. 180 tablet 0  .  predniSONE (DELTASONE) 10 MG tablet 40 mg x1 day, then 30 mg x1 day, then 20 mg x1 day, then 10 mg x1 day, and then 5 mg x1 day and stop 11 tablet 0  . Pseudoephedrine-Guaifenesin (MUCINEX D PO) Take by mouth.    Marland Kitchen tiZANidine (ZANAFLEX) 2 MG tablet Take 2 mg by mouth 3 (three) times daily.  2  . valsartan (DIOVAN) 80 MG tablet Take 1 tablet (80 mg total) by mouth daily. 90 tablet 1  . ALPRAZolam (XANAX) 0.5 MG tablet Take 1 hour  prior to appointment, may repeat x 1 if needed (Patient not taking: Reported on 05/30/2016) 2 tablet 0  . amitriptyline (ELAVIL) 50 MG tablet Take 100 mg by mouth daily.   5  . vancomycin (VANCOCIN) 50 mg/mL oral solution Take 125 mg QID x 2 weeks, then 125 mg TID x 7 days, then 125 mg QD x 7 days, then 125 mg QOD x 2 weeks (Patient not taking: Reported on 05/30/2016) 140 mL 0   No current facility-administered medications on file prior to visit.     Allergies  Allergen Reactions  . Gabapentin Swelling    Swelling in legs  . Losartan Other (See Comments)    Myalgias and muscle cramping  . Oxycodone Itching  . Sulfa Antibiotics Nausea Only and Rash  . Sulfonamide Derivatives Nausea Only    Family History  Problem Relation Age of Onset  . Diabetes Mother   . Heart disease Mother   . Heart disease Father   . Colon cancer Maternal Uncle     x 2  . Breast cancer Other     great aunts x 5    BP 128/82   Pulse 85   Ht 5\' 9"  (1.753 m)   Wt 283 lb (128.4 kg)   SpO2 92%   BMI 41.79 kg/m   Review of Systems She denies hypoglycemia    Objective:   Physical Exam VITAL SIGNS:  See vs page GENERAL: no distress.  Morbid obesity Pulses: dorsalis pedis intact bilat.   MSK: no deformity of the feet.  CV: 1+ bilat leg leg edema, hyperpigmentation, and bilat vv's Skin:  no ulcer on the feet.  normal color and temp on the feet. Neuro: sensation is intact to touch on the feet.  Ext: There is bilateral onychomycosis of the toenails.       Assessment & Plan:    Insulin-requiring type 2 DM: she needs increased rx.  Patient is advised the following: Patient Instructions  check your blood sugar twice a day.  vary the time of day when you check, between before the 3 meals, and at bedtime.  also check if you have symptoms of your blood sugar being too high or too low.  please keep a record of the readings and bring it to your next appointment here (or you can bring the meter itself).  You can write it on any piece of paper.  please call us sooner if your blood sugar goes below 70, or if you have a lot of readings over 200. increase the NPH to 120 units each morning.  Please come back for a follow-up appointment in 2-3 weeks.

## 2016-05-31 ENCOUNTER — Encounter: Payer: Self-pay | Admitting: Internal Medicine

## 2016-06-04 ENCOUNTER — Encounter: Payer: Self-pay | Admitting: Internal Medicine

## 2016-06-04 ENCOUNTER — Ambulatory Visit (INDEPENDENT_AMBULATORY_CARE_PROVIDER_SITE_OTHER): Payer: Medicare Other | Admitting: Internal Medicine

## 2016-06-04 VITALS — BP 130/77 | HR 103 | Temp 98.3°F | Wt 284.0 lb

## 2016-06-04 DIAGNOSIS — A0471 Enterocolitis due to Clostridium difficile, recurrent: Secondary | ICD-10-CM | POA: Diagnosis not present

## 2016-06-04 NOTE — Progress Notes (Signed)
RFV: follow up for recurrent cdifficile diarrhea  Patient ID: Mary Acosta, female   DOB: May 19, 1956, 60 y.o.   MRN: [id number]  HPI 60yo F with hx of diabetes, COPD, obesity, NASH, chronic pain on opiates, who has recurrent cdifficile. She states that at baseline she can have several bowel movements but she can tell when she has recurrence. She has finished her vanco taper on 10/3. She was doing well except one day had numerous watery stools that only lasted 24hr now back to her normal routine. No BM yesterday. She is interested in [place] -currently on disability - has not been seen by gastroenterology   Outpatient Encounter Prescriptions as of 06/04/2016  Medication Sig  . acetaminophen (TYLENOL) 500 MG tablet Take 500 mg by mouth every 4 (four) hours as needed for moderate pain.  Marland Kitchen albuterol (PROVENTIL HFA;VENTOLIN HFA) 108 (90 BASE) MCG/ACT inhaler Inhale 2 puffs into the lungs every 6 (six) hours as needed for wheezing or shortness of breath.  Marland Kitchen amitriptyline (ELAVIL) 50 MG tablet Take 100 mg by mouth daily.   . cefdinir (OMNICEF) 300 MG capsule Take 1 capsule (300 mg total) by mouth 2 (two) times daily.  . cetirizine (ZYRTEC) 10 MG tablet Take 10 mg by mouth daily as needed for allergies.   Marland Kitchen desvenlafaxine (PRISTIQ) 50 MG 24 hr tablet   . gabapentin (NEURONTIN) 100 MG capsule Take 100 mg by mouth 3 (three) times daily.   Marland Kitchen HYDROmorphone (DILAUDID) 4 MG tablet Take 4 mg by mouth every 6 (six) hours as needed for moderate pain.   . hydrOXYzine (ATARAX/VISTARIL) 25 MG tablet 25 mg 2 (two) times daily.   . insulin NPH Human (NOVOLIN N) 100 UNIT/ML injection Inject 1.2 mLs (120 Units total) into the skin every morning.  . Insulin Syringe-Needle U-100 (INSULIN SYRINGE 1CC/30GX5/16") 30G X 5/16" 1 ML MISC Use as directed for insulin injections 5 times daily  . ipratropium-albuterol (DUONEB) 0.5-2.5 (3) MG/3ML SOLN Take 3 mLs by nebulization 4 (four) times daily. (Patient taking  differently: Take 3 mLs by nebulization every 6 (six) hours as needed. )  . levothyroxine (SYNTHROID, LEVOTHROID) 150 MCG tablet Take 150 mcg by mouth daily before breakfast.  . Melatonin 5 MG CAPS Take 5 mg by mouth at bedtime as needed (sleep).   . meloxicam (MOBIC) 7.5 MG tablet Take 7.5 mg by mouth daily as needed for pain.   Marland Kitchen morphine (MS CONTIN) 30 MG 12 hr tablet Take 30 mg by mouth 2 (two) times daily.  . ondansetron (ZOFRAN) 8 MG tablet Take 1 tablet (8 mg total) by mouth every 8 (eight) hours as needed for nausea or vomiting.  . polyethylene glycol (MIRALAX / GLYCOLAX) packet Take 17 g by mouth 2 (two) times daily as needed for moderate constipation.  . potassium chloride SA (K-DUR,KLOR-CON) 20 MEQ tablet Take 2 tablets by mouth daily as needed for cramping.  . predniSONE (DELTASONE) 10 MG tablet 40 mg x1 day, then 30 mg x1 day, then 20 mg x1 day, then 10 mg x1 day, and then 5 mg x1 day and stop  . Pseudoephedrine-Guaifenesin (MUCINEX D PO) Take by mouth.  Marland Kitchen tiZANidine (ZANAFLEX) 2 MG tablet Take 2 mg by mouth 3 (three) times daily.  . valsartan (DIOVAN) 80 MG tablet Take 1 tablet (80 mg total) by mouth daily.  . [DISCONTINUED] ALPRAZolam (XANAX) 0.5 MG tablet Take 1 hour prior to appointment, may repeat x 1 if needed (Patient not taking: Reported on 05/30/2016)  . [  DISCONTINUED] vancomycin (VANCOCIN) 50 mg/mL oral solution Take 125 mg QID x 2 weeks, then 125 mg TID x 7 days, then 125 mg QD x 7 days, then 125 mg QOD x 2 weeks (Patient not taking: Reported on 05/30/2016)   No facility-administered encounter medications on file as of 06/04/2016.      Patient Active Problem List   Diagnosis Date Noted  . Acute bronchitis 04/30/2016  . Pain in toe 04/09/2016  . Lump in neck 01/29/2016  . Dysuria 01/29/2016  . Chronic diastolic (congestive) heart failure 01/03/2016  . Fatty liver 01/03/2016  . Type 1 diabetes mellitus ([place]) 01/03/2016  . Recurrent Clostridium difficile diarrhea  01/03/2016  . Liver cirrhosis ([place]) 12/07/2015  . Chronic respiratory failure ([place]) 11/10/2014  . Physical deconditioning 09/26/2014  . Chronic obstructive pulmonary disease ([place]) 09/26/2014  . Dyspnea 09/26/2014  . HCAP (healthcare-associated pneumonia) 08/26/2014  . COPD (chronic obstructive pulmonary disease) ([place]) 08/26/2014  . Heme positive stool 04/05/2011  . Iron deficiency anemia, unspecified  04/05/2011  . Bariatric surgery status 04/05/2011  . Diarrhea 03/04/2011  . Left arm pain   . UNSPECIFIED VITAMIN D DEFICIENCY 10/22/2007  . LOW BACK PAIN, CHRONIC 10/22/2007  . INSOMNIA 10/22/2007  . OBESITY 10/14/2007  . Chronic pain syndrome 10/14/2007  . CARPAL TUNNEL SYNDROME 10/14/2007  . PERIPHERAL NEUROPATHY 10/14/2007  . ALLERGIC RHINITIS CAUSE UNSPECIFIED 10/14/2007  . ARTHRITIS 10/14/2007  . Hypothyroid 10/14/2007  . HLD (hyperlipidemia) 09/14/2007  . Anxiety state 08/25/2007  . DEPRESSION 08/25/2007  . Essential hypertension 08/25/2007  . ASTHMA 08/25/2007  . CONSTIPATION 08/25/2007  . POLYMYALGIA RHEUMATICA 08/25/2007  . LEG EDEMA, BILATERAL 08/25/2007     Health Maintenance Due  Topic Date Due  . Hepatitis C Screening  1956/01/03  . TETANUS/TDAP  06/16/1975     Review of Systems  Physical Exam   BP 130/77   Pulse (!) 103   Temp 98.3 F (36.8 C) (Oral)   Wt 128.8 kg (284 lb)   BMI 41.94 kg/m   No results found for: CD4TCELL No results found for: CD4TABS No results found for: HIV1RNAQUANT No results found for: HEPBSAB No results found for: RPR  CBC Lab Results  Component Value Date   WBC 5.8 01/29/2016   RBC 4.72 01/29/2016   HGB 11.9 (L) 01/29/2016   HCT 36.5 01/29/2016   PLT 208.0 01/29/2016   MCV 77.3 (L) 01/29/2016   MCH 24.3 (L) 01/05/2016   MCHC 32.5 01/29/2016   RDW 16.3 (H) 01/29/2016   LYMPHSABS 2.0 01/29/2016   MONOABS 0.6 01/29/2016   EOSABS 0.4 01/29/2016   BASOSABS 0.0 01/29/2016   BMET Lab Results  Component Value  Date   NA 134 (L) 01/29/2016   K 3.5 01/29/2016   CL 99 01/29/2016   CO2 27 01/29/2016   GLUCOSE 333 (H) 01/29/2016   BUN 19 01/29/2016   CREATININE 0.91 01/29/2016   CALCIUM 8.9 01/29/2016   GFRNONAA 34 (L) 01/06/2016   GFRAA 39 (L) 01/06/2016     Assessment and Plan Recurrent cdifficile = she has had several relapses this year and would meet criteria for FMT - see gastroenterology for cdiff FMT eval - will reach out to patient assistance program through [employer] - gave stool kit if she has worsening diarrhea to see if she would need to be retreated

## 2016-06-07 ENCOUNTER — Ambulatory Visit (INDEPENDENT_AMBULATORY_CARE_PROVIDER_SITE_OTHER): Payer: Medicare Other | Admitting: Internal Medicine

## 2016-06-07 ENCOUNTER — Encounter: Payer: Self-pay | Admitting: Internal Medicine

## 2016-06-07 VITALS — BP 126/70 | HR 92 | Temp 98.5°F | Resp 16 | Wt 285.0 lb

## 2016-06-07 DIAGNOSIS — G894 Chronic pain syndrome: Secondary | ICD-10-CM

## 2016-06-07 DIAGNOSIS — Z9884 Bariatric surgery status: Secondary | ICD-10-CM | POA: Diagnosis not present

## 2016-06-07 DIAGNOSIS — I10 Essential (primary) hypertension: Secondary | ICD-10-CM | POA: Diagnosis not present

## 2016-06-07 DIAGNOSIS — IMO0001 Reserved for inherently not codable concepts without codable children: Secondary | ICD-10-CM

## 2016-06-07 DIAGNOSIS — E1042 Type 1 diabetes mellitus with diabetic polyneuropathy: Secondary | ICD-10-CM | POA: Diagnosis not present

## 2016-06-07 DIAGNOSIS — E6609 Other obesity due to excess calories: Secondary | ICD-10-CM

## 2016-06-07 DIAGNOSIS — E038 Other specified hypothyroidism: Secondary | ICD-10-CM | POA: Diagnosis not present

## 2016-06-07 NOTE — Progress Notes (Signed)
Subjective:    Patient ID: Mary Acosta, female    DOB: 1956-04-18, 60 y.o.   MRN: [id number]  HPI The patient is here for follow up.  Rash:  In the morning she wakes up with bright redness in her arms, face and neck.  It looks like they are sunburn.  She wonders it if is from her neuropathy .  She does have burning pain in her bilateral lower arms from her neuropathy.  The rash does burn.   The rash comes and goes, but tends to improve after it comes on its own.  The rash does not itch.  It is not raised. She denies new medications.   Hypertension: She is taking her medication daily. She is compliant with a low sodium diet.  She is not exercising regularly.  She does not monitor her blood pressure at home.    Diabetes: She is seeing Dr Loanne Drilling.  She is taking her medication daily as prescribed. She is not compliant with a diabetic diet. She is not exercising regularly. She checks her feet daily and denies foot lesions. She is up-to-date with an ophthalmology examination.   Hypothyroidism:  Dr Loanne Drilling is managing her thyroid.  She is taking her medication daily.  She denies any recent changes in energy or weight that are unexplained.   Chronic pain syndrome: She is following with pain management and they're managing her pain medication.  Anxiety, depression: She follows with psychiatry.  Recurrent Cdiff:  She recently saw ID.  She had diarrhea once last week, but nothing continuous.  She will be seeing GI and will likely go through the fecal transplant.  Obesity:  She has had a lap band and does not feel that it helps.  She has had it adjusted last year and it did help.  There was no resistance.  She thinks that is where she got the cdiff.  She eats too much and does not know how to stop.   Medications and allergies reviewed with patient and updated if appropriate.  Patient Active Problem List   Diagnosis Date Noted  . Acute bronchitis 04/30/2016  . Pain in toe 04/09/2016    . Lump in neck 01/29/2016  . Dysuria 01/29/2016  . Chronic diastolic (congestive) heart failure 01/03/2016  . Fatty liver 01/03/2016  . Type 1 diabetes mellitus ([place]) 01/03/2016  . Recurrent Clostridium difficile diarrhea 01/03/2016  . Liver cirrhosis ([place]) 12/07/2015  . Chronic respiratory failure ([place]) 11/10/2014  . Physical deconditioning 09/26/2014  . Chronic obstructive pulmonary disease ([place]) 09/26/2014  . Dyspnea 09/26/2014  . HCAP (healthcare-associated pneumonia) 08/26/2014  . COPD (chronic obstructive pulmonary disease) ([place]) 08/26/2014  . Iron deficiency anemia, unspecified  04/05/2011  . Bariatric surgery status 04/05/2011  . Diarrhea 03/04/2011  . Left arm pain   . UNSPECIFIED VITAMIN D DEFICIENCY 10/22/2007  . LOW BACK PAIN, CHRONIC 10/22/2007  . INSOMNIA 10/22/2007  . OBESITY 10/14/2007  . Chronic pain syndrome 10/14/2007  . CARPAL TUNNEL SYNDROME 10/14/2007  . PERIPHERAL NEUROPATHY 10/14/2007  . ALLERGIC RHINITIS CAUSE UNSPECIFIED 10/14/2007  . ARTHRITIS 10/14/2007  . Hypothyroid 10/14/2007  . HLD (hyperlipidemia) 09/14/2007  . Anxiety state 08/25/2007  . Depression 08/25/2007  . Essential hypertension 08/25/2007  . ASTHMA 08/25/2007  . CONSTIPATION 08/25/2007  . POLYMYALGIA RHEUMATICA 08/25/2007  . LEG EDEMA, BILATERAL 08/25/2007    Current Outpatient Prescriptions on File Prior to Visit  Medication Sig Dispense Refill  . acetaminophen (TYLENOL) 500 MG tablet Take 500 mg  by mouth every 4 (four) hours as needed for moderate pain.    Marland Kitchen albuterol (PROVENTIL HFA;VENTOLIN HFA) 108 (90 BASE) MCG/ACT inhaler Inhale 2 puffs into the lungs every 6 (six) hours as needed for wheezing or shortness of breath. 1 Inhaler 2  . amitriptyline (ELAVIL) 50 MG tablet Take 100 mg by mouth daily.   5  . cetirizine (ZYRTEC) 10 MG tablet Take 10 mg by mouth daily as needed for allergies.     Marland Kitchen desvenlafaxine (PRISTIQ) 50 MG 24 hr tablet     . gabapentin (NEURONTIN) 100 MG  capsule Take 100 mg by mouth 3 (three) times daily.     Marland Kitchen HYDROmorphone (DILAUDID) 4 MG tablet Take 4 mg by mouth every 6 (six) hours as needed for moderate pain.   0  . hydrOXYzine (ATARAX/VISTARIL) 25 MG tablet 25 mg 2 (two) times daily.     . insulin NPH Human (NOVOLIN N) 100 UNIT/ML injection Inject 1.2 mLs (120 Units total) into the skin every morning. 40 mL 11  . Insulin Syringe-Needle U-100 (INSULIN SYRINGE 1CC/30GX5/16") 30G X 5/16" 1 ML MISC Use as directed for insulin injections 5 times daily 150 each 1  . ipratropium-albuterol (DUONEB) 0.5-2.5 (3) MG/3ML SOLN Take 3 mLs by nebulization 4 (four) times daily. (Patient taking differently: Take 3 mLs by nebulization every 6 (six) hours as needed. ) 360 mL 11  . levothyroxine (SYNTHROID, LEVOTHROID) 150 MCG tablet Take 150 mcg by mouth daily before breakfast.    . Melatonin 5 MG CAPS Take 5 mg by mouth at bedtime as needed (sleep).     . meloxicam (MOBIC) 7.5 MG tablet Take 7.5 mg by mouth daily as needed for pain.   5  . morphine (MS CONTIN) 30 MG 12 hr tablet Take 30 mg by mouth 2 (two) times daily.    . ondansetron (ZOFRAN) 8 MG tablet Take 1 tablet (8 mg total) by mouth every 8 (eight) hours as needed for nausea or vomiting. 60 tablet 1  . polyethylene glycol (MIRALAX / GLYCOLAX) packet Take 17 g by mouth 2 (two) times daily as needed for moderate constipation. 14 each 0  . potassium chloride SA (K-DUR,KLOR-CON) 20 MEQ tablet Take 2 tablets by mouth daily as needed for cramping. 180 tablet 0  . Pseudoephedrine-Guaifenesin (MUCINEX D PO) Take by mouth.    Marland Kitchen tiZANidine (ZANAFLEX) 2 MG tablet Take 2 mg by mouth 3 (three) times daily.  2  . valsartan (DIOVAN) 80 MG tablet Take 1 tablet (80 mg total) by mouth daily. 90 tablet 1   No current facility-administered medications on file prior to visit.     Past Medical History:  Diagnosis Date  . Anxiety    with panic attacks  . Arthritis    "back; feet; hands; shoulders" (08/26/2014)  .  Asthma   . Cervical cancer ([address])   . Chronic lower back pain   . Chronic narcotic use   . Chronic pain syndrome    [facility]  . COPD (chronic obstructive pulmonary disease) ([place])   . Daily headache   . Depression   . DJD (degenerative joint disease)   . Fatty liver disease, nonalcoholic   . Fatty liver disease, nonalcoholic   . Fibromyalgia   . Frequency of urination   . HCAP (healthcare-associated pneumonia) 08/26/2014  . History of TIA (transient ischemic attack) 11-01-2010   NO RESIDUAL  . Hyperlipidemia   . Hypertension   . Hypothyroidism   . IDDM (  insulin dependent diabetes mellitus) ([place])   . Insomnia   . Lumbar stenosis   . Nocturia   . OSA (obstructive sleep apnea)    NO CPAP SINCE WT LOSS  . Osteoarthritis    with severe disease in knee  . Pneumonia "several times"  . Polymyalgia rheumatica ([place])   . Scoliosis   . Seasonal allergies   . Thyroid cancer ([place])   . Urgency of urination   . Vaginal pain S/P SLING  FEB 2012    Past Surgical History:  Procedure Laterality Date  . APPENDECTOMY  1982  . BREAST LUMPECTOMY Left 02-28-2005   ATYPICAL DUCTAL HYPERPLASIA  . CARDIAC CATHETERIZATION  09-04-2004   NORMAL CORONARY ANATOMY/ NORMAL LVF/ EF 60%  . CARDIOVASCULAR STRESS TEST  12-27-2010  DR [place]   ABNORMAL NUCLEAR STUDY W/ /MILD INFERIOR ISCHEMIA/ EF 69%/  CT HEART ANGIOGRAM ;  NO ACUTE FINDINGS  . CRYOABLATION  05/16/2003   w/LEEP FOR ABNORMAL PAP SMEAR  . CYSTOSCOPY  05/18/2012   Procedure: CYSTOSCOPY;  Surgeon: Reece Packer, MD;  Location: [facility];  Service: Urology;  Laterality: N/A;  examination under anethesia  . HYSTEROSCOPY W/D&C  08-19-2007   PMB  . KNEE ARTHROSCOPY W/ DEBRIDEMENT Left 03/29/2006   INTERNAL DERANGEMENT/ SEVERE DJD/ MENISCUS TEARS  . LAPAROSCOPIC CHOLECYSTECTOMY  06-10-2002  . LAPAROSCOPIC GASTRIC BANDING  03/01/2006   TRUNCAL VAGOTOMY/ PLACEMENT OF VG BAND  . REVISION TOTAL KNEE ARTHROPLASTY  Left 08-29-2008; 05/2009  . TONSILLECTOMY  1969  . TOTAL KNEE ARTHROPLASTY Left 01-23-2007   SEVERE DJD  . TOTAL THYROIDECTOMY  11-22-2005   BILATERAL THYROID NODULES-- PAPILLARY CARCINOMA (0.5CM)/ ADENOMATOID NODULES  . TRANSTHORACIC ECHOCARDIOGRAM  12-27-2010   LVSF NORMAL / EF XX123456 GRADE I DIASTOLIC DYSFUNCTION/ MILD MITRAL REGURG. / MILDLY DILATED LEFT ATRIUM/ MILDY INCREASED SYSTOLIC PRESSURE OF PULMONARY ARTERIES  . TRANSVAGINAL SUBURETERAL TAPE/ SLING  09-28-2010   MIXED URINARY INCONTINENCE  . TUBAL LIGATION  1983    Social History   Social History  . Marital status: Married    Spouse name: N/A  . Number of children: 2  . Years of education: N/A   Occupational History  . disabled Unemployed   Social History Main Topics  . Smoking status: Former Smoker    Packs/day: 2.50    Years: 39.00    Types: Cigarettes    Quit date: 10/22/2010  . Smokeless tobacco: Never Used  . Alcohol use No  . Drug use: No  . Sexual activity: Not Currently   Other Topics Concern  . None   Social History Narrative  . None    Family History  Problem Relation Age of Onset  . Diabetes Mother   . Heart disease Mother   . Heart disease Father   . Colon cancer Maternal Uncle     x 2  . Breast cancer Other     great aunts x 5    Review of Systems  Constitutional: Negative for fever.  HENT: Positive for congestion. Negative for ear pain and sore throat.   Respiratory: Positive for cough (productive of brown, green phlegm), shortness of breath (chronic - no increase) and wheezing.   Cardiovascular: Positive for palpitations (intermittent) and leg swelling (chronic). Negative for chest pain.  Gastrointestinal: Positive for diarrhea (one episode one week ago, none since).  Neurological: Negative for light-headedness and headaches.       Objective:   Vitals:   06/07/16 1040  BP: 126/70  Pulse: 92  Resp: 16  Temp: 98.5 F (36.9 C)   Filed Weights   06/07/16 1040  Weight:  285 lb (129.3 kg)   Body mass index is 42.09 kg/m.   Physical Exam    Constitutional: Appears well-developed and well-nourished. No distress, but appears overmedicated.  HENT:  Head: Normocephalic and atraumatic.  Neck: Neck supple. No tracheal deviation present. No thyromegaly present.  No cervical lymphadenopathy Cardiovascular: Normal rate, regular rhythm and normal heart sounds.   No murmur heard. No carotid bruit .  B/l LE  Edema with pitting that is mild and chronic skin changes Pulmonary/Chest: Effort normal and breath sounds normal. No respiratory distress. No has no wheezes. No rales.  Skin: Skin is warm and dry. Not diaphoretic.  mild erythema on arms, face and chest - non-raised, nontender, no warmth Psychiatric: Normal mood and affect. Behavior is normal.      Assessment & Plan:    See Problem List for Assessment and Plan of chronic medical problems.

## 2016-06-07 NOTE — Patient Instructions (Addendum)
  Test(s) ordered today. Your results will be released to MyChart (or called to you) after review, usually within 72hours after test completion. If any changes need to be made, you will be notified at that same time.  All other Health Maintenance issues reviewed.   All recommended immunizations and age-appropriate screenings are up-to-date or discussed.  No immunizations administered today.   Medications reviewed and updated.  No changes recommended at this time.  Your prescription(s) have been submitted to your pharmacy. Please take as directed and contact our office if you believe you are having problem(s) with the medication(s).   Please followup in 6 months   

## 2016-06-07 NOTE — Assessment & Plan Note (Signed)
Management per Dr Loanne Drilling Stressed decreased intake - she is having a hard time controlling her eating a1c done recently

## 2016-06-07 NOTE — Assessment & Plan Note (Signed)
Will follow up with bariatric surgeon regarding revision of lap band Stressed changing eating habits - decrease intake

## 2016-06-07 NOTE — Assessment & Plan Note (Signed)
Management per Dr. Ellison 

## 2016-06-07 NOTE — Assessment & Plan Note (Signed)
BP well controlled Current regimen effective and well tolerated Continue current medications at current doses  

## 2016-06-07 NOTE — Progress Notes (Signed)
Pre visit review using our clinic review tool, if applicable. No additional management support is needed unless otherwise documented below in the visit note. 

## 2016-06-07 NOTE — Assessment & Plan Note (Signed)
Management per pain management.  

## 2016-06-14 ENCOUNTER — Other Ambulatory Visit: Payer: Medicare Other

## 2016-06-14 ENCOUNTER — Other Ambulatory Visit: Payer: Self-pay | Admitting: *Deleted

## 2016-06-14 DIAGNOSIS — A0471 Enterocolitis due to Clostridium difficile, recurrent: Secondary | ICD-10-CM | POA: Diagnosis not present

## 2016-06-15 LAB — CLOSTRIDIUM DIFFICILE BY PCR: Toxigenic C. Difficile by PCR: NOT DETECTED

## 2016-06-17 ENCOUNTER — Telehealth: Payer: Self-pay | Admitting: *Deleted

## 2016-06-17 NOTE — Telephone Encounter (Signed)
Patient calling for results of her stool culture. She would like to know if anything can be called in, states she believes she still has c diff, that she cannot leave the bathroom due to constant diarrhea. She is especially concerned about being able to make it to her dentist appointment later this week. Landis Gandy, RN

## 2016-06-18 LAB — STOOL CULTURE

## 2016-06-18 NOTE — Telephone Encounter (Signed)
Relayed information to patient.  She feels this is still an inaccurate test result. She states her diarrhea is "all water, runs all out of me until it runs all clear." This happens about 15 times in 1 day, but then will not happen again for 4-5 days (she states this is due to her opioid use and constipation). She want to repeat the sample.

## 2016-06-18 NOTE — Telephone Encounter (Signed)
Can you call mrs butler and let her know that her cdifficile testing was negative. She can take imodium to help slow up the diarrhea

## 2016-06-24 ENCOUNTER — Ambulatory Visit: Payer: Medicare Other | Admitting: Internal Medicine

## 2016-06-25 NOTE — Telephone Encounter (Signed)
Will advise her of advice. Patient is scheduled to see GI tomorrow.

## 2016-06-25 NOTE — Telephone Encounter (Signed)
She will have to ask her pcp to repeat the cdifficile pcr test. Let her know that cdifficile colitis is not intermittent so I think she may have a concurrent irritable bowel syndrome. We can refer to gi to help her.

## 2016-06-26 ENCOUNTER — Encounter: Payer: Self-pay | Admitting: Nurse Practitioner

## 2016-06-26 ENCOUNTER — Ambulatory Visit (INDEPENDENT_AMBULATORY_CARE_PROVIDER_SITE_OTHER): Payer: Medicare Other | Admitting: Nurse Practitioner

## 2016-06-26 VITALS — BP 118/74 | HR 68 | Ht 69.0 in | Wt 285.2 lb

## 2016-06-26 DIAGNOSIS — A0471 Enterocolitis due to Clostridium difficile, recurrent: Secondary | ICD-10-CM | POA: Diagnosis not present

## 2016-06-26 DIAGNOSIS — R110 Nausea: Secondary | ICD-10-CM

## 2016-06-26 DIAGNOSIS — K7030 Alcoholic cirrhosis of liver without ascites: Secondary | ICD-10-CM | POA: Diagnosis not present

## 2016-06-26 MED ORDER — PROMETHAZINE HCL 25 MG PO TABS: 25.0000 mg | ORAL_TABLET | Freq: Four times a day (QID) | ORAL | 0 refills | Status: DC | PRN

## 2016-06-26 NOTE — Patient Instructions (Signed)
We have sent the following medications to your pharmacy for you to pick up at your convenience: phenergan.

## 2016-06-26 NOTE — Progress Notes (Signed)
     HPI: Patient is a 60 year old female known previously to Dr. Sharlett Iles but most recently to Dr. Loletha Carrow. She has a history of GERD, IBS and cirrhosis (recently diagnosed by CTscan). She has multiple other medical problems as summarized in our 08/31/15 office note. In short, patient has cirrhosis, chronic pain syndrome followed by pain management at [facility]. She is s/p lap band for obesity , truncal vagotomy, appendectomy, and cholecystectomy. She has COPD on 02 at night.  She has chronic anxiety / depression, hypertension, CAD, uncontrolled diabetes, and fibromyalgia rheumatica.  Patient last seen her in January 2017 for diffuse abdominal pain, nausea and bloating. She was treated empirically with flagyl for SIBO. She had a follow up in late Feb to discuss above symptoms and new CTscan findings of cirrhosis. Patient felt to have narcotic bowel. We were reluctant to refill flagyl as it could worsen her neuropathy. In May patient called with diarrhea, C-diff was positive, patient admitted with dehydration.She was treated with Vancomycin.  Patient saw PCP in June with recurrent diarrhea, C. difficile was positive and restarted on vancomycin. Patient saw Infectious Disease mid July , given 42 day tapering dose of vancomycin. Someone check C. difficile again at August, still positive.  ID referred patient to [place] for possible fecal transplant. Since completing the extended course of vancomycin patient has only had one episode of diarrhea which she submitted to ID and it was negative for C-diff.  ID (Dr. Wilder Glade) called patient yesterday with the stool results but recommended she still keep this appointment today.    Patient's surgical history, family medical history, social history, medications and allergies were all reviewed in Epic   Physical Exam: BP 118/74   Pulse 68   Ht 5\' 9"  (1.753 m)   Wt 285 lb 4 oz (129.4 kg)   BMI 42.12 kg/m   GENERAL: obese white female in NAD PSYCH: :Pleasant, cooperative,  normal affect HEENT: Normocephalic, conjunctiva pink, mucous membranes moist, neck supple without masses CARDIAC:  RRR, no murmur heard, no peripheral edema PULM: Normal respiratory effort, lungs CTA bilaterally, no wheezing ABDOMEN:  Soft,obese,  nontender,  no obvious masses,  normal bowel sounds SKIN:  turgor, no lesions seen NEURO: Alert and oriented x 3, no focal neurologic deficits  ASSESSMENT and PLAN:  63. 60 year old female referred by ID for consideration of a fecal transplant for recurrent C-diff (three episodes this year). Patient has completed 42 day Vancomycin taper. Only one episode of diarrhea since completion of vancomycin and the specimen was negative for C-diff on 10/20. BMs currently normal. ID (Dr. Wilder Glade) has been in touch with [employer] about patient assistance program.   2. Cirrhosis, no evidence for decompensation at present. She is followed by Dr. Loletha Carrow for this. Seen here in February, felt to be poor candidate for EGD for varices screening because of uncontrolled blood sugars.  Diabetes still poorly controlled, A1c 12.4 in late September. Insulin recently increased by Endocrinologist. She will follow up with Dr. Loletha Carrow regarding further management   3. Nausea. Will give her one refill on phenergan, caution sedation. Explained that the stomach doesn't empty well when blood sugars are elevated so better glycemic control is paramount.    4. Chronic pain on chronic narcotics   Tye Savoy, NP

## 2016-06-27 ENCOUNTER — Encounter: Payer: Self-pay | Admitting: Internal Medicine

## 2016-06-27 DIAGNOSIS — R413 Other amnesia: Secondary | ICD-10-CM

## 2016-06-29 NOTE — Telephone Encounter (Signed)
Mary Acosta has complicated GI history and her symptoms may mimic cdifficile colitis. On our recent test it is negative, thus she does not need treatment at this time. If her appt with APP wasn't addressing her symptoms, would recommend for her to get an another appt to address what is troubling her now. We can ask that she is seen by MD

## 2016-07-01 ENCOUNTER — Ambulatory Visit: Payer: Medicare Other | Admitting: Endocrinology

## 2016-07-01 ENCOUNTER — Telehealth: Payer: Self-pay | Admitting: Emergency Medicine

## 2016-07-01 NOTE — Telephone Encounter (Signed)
Please advise 

## 2016-07-01 NOTE — Progress Notes (Signed)
Presently cured - will see if stays that way.  Will be on standby if she needs FMT.  Gatha Mayer, MD, [facility]

## 2016-07-01 NOTE — Telephone Encounter (Signed)
Will 'cc Dr. Loletha Carrow.

## 2016-07-01 NOTE — Telephone Encounter (Signed)
This was already ordered.

## 2016-07-01 NOTE — Telephone Encounter (Signed)
Pt called and stated she needs a referral for [facility] due to memory loss. Please advise thanks.

## 2016-07-02 NOTE — Telephone Encounter (Signed)
Spoke with pt to inform.  

## 2016-07-03 NOTE — Telephone Encounter (Signed)
I agree with Dr Baxter Flattery of Infectious Disease that this patient's testing indicates that she no longer has C difficile.  I think this is the altered GI motility she has due to chronic opioid use, which was made worse by the recent C diff infection.  I am not sure I have much else to offer for that, but she is welcome to see me in the office.

## 2016-07-03 NOTE — Telephone Encounter (Signed)
Spoke to patient, she understands Dr. Loletha Carrow' assessment of altered GI motility. She also mentioned that she needs appointment for a 6 month follow up to discuss possible EGD. Patient also questioned if she is due for a colonoscopy at this time.

## 2016-07-09 DIAGNOSIS — F331 Major depressive disorder, recurrent, moderate: Secondary | ICD-10-CM | POA: Diagnosis not present

## 2016-07-09 DIAGNOSIS — F411 Generalized anxiety disorder: Secondary | ICD-10-CM | POA: Diagnosis not present

## 2016-07-24 DIAGNOSIS — M5137 Other intervertebral disc degeneration, lumbosacral region: Secondary | ICD-10-CM | POA: Diagnosis not present

## 2016-07-24 DIAGNOSIS — Z5181 Encounter for therapeutic drug level monitoring: Secondary | ICD-10-CM | POA: Diagnosis not present

## 2016-07-24 DIAGNOSIS — G894 Chronic pain syndrome: Secondary | ICD-10-CM | POA: Diagnosis not present

## 2016-07-24 DIAGNOSIS — Z0289 Encounter for other administrative examinations: Secondary | ICD-10-CM | POA: Diagnosis not present

## 2016-07-24 DIAGNOSIS — M533 Sacrococcygeal disorders, not elsewhere classified: Secondary | ICD-10-CM | POA: Diagnosis not present

## 2016-07-25 ENCOUNTER — Encounter: Payer: Self-pay | Admitting: Neurology

## 2016-07-25 ENCOUNTER — Ambulatory Visit: Payer: Medicare Other | Admitting: Neurology

## 2016-07-25 ENCOUNTER — Ambulatory Visit (INDEPENDENT_AMBULATORY_CARE_PROVIDER_SITE_OTHER): Payer: Medicare Other | Admitting: Neurology

## 2016-07-25 VITALS — BP 168/92 | HR 103 | Ht 69.0 in | Wt 284.0 lb

## 2016-07-25 DIAGNOSIS — E538 Deficiency of other specified B group vitamins: Secondary | ICD-10-CM

## 2016-07-25 DIAGNOSIS — R413 Other amnesia: Secondary | ICD-10-CM | POA: Diagnosis not present

## 2016-07-25 HISTORY — DX: Other amnesia: R41.3

## 2016-07-25 MED ORDER — ALPRAZOLAM 0.5 MG PO TABS: ORAL_TABLET | ORAL | 0 refills | Status: DC

## 2016-07-25 NOTE — Patient Instructions (Signed)
   We will get blood work today, and get MRI of the brain
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
## 2016-07-26 LAB — RPR: RPR Ser Ql: NONREACTIVE

## 2016-07-26 LAB — SEDIMENTATION RATE: Sed Rate: 48 mm/hr — ABNORMAL HIGH (ref 0–40)

## 2016-07-26 LAB — HIV ANTIBODY (ROUTINE TESTING W REFLEX): HIV Screen 4th Generation wRfx: NONREACTIVE

## 2016-07-26 LAB — VITAMIN B12: Vitamin B-12: 542 pg/mL (ref 211–946)

## 2016-07-26 LAB — AMMONIA: Ammonia: 57 ug/dL (ref 19–87)

## 2016-07-30 ENCOUNTER — Other Ambulatory Visit: Payer: Medicare Other

## 2016-07-30 ENCOUNTER — Encounter: Payer: Self-pay | Admitting: Neurology

## 2016-07-30 ENCOUNTER — Ambulatory Visit (INDEPENDENT_AMBULATORY_CARE_PROVIDER_SITE_OTHER): Payer: Medicare Other | Admitting: Internal Medicine

## 2016-07-30 ENCOUNTER — Other Ambulatory Visit: Payer: Self-pay | Admitting: Internal Medicine

## 2016-07-30 ENCOUNTER — Encounter: Payer: Self-pay | Admitting: Internal Medicine

## 2016-07-30 VITALS — BP 124/88 | HR 84 | Ht 69.0 in | Wt 278.0 lb

## 2016-07-30 DIAGNOSIS — J441 Chronic obstructive pulmonary disease with (acute) exacerbation: Secondary | ICD-10-CM

## 2016-07-30 DIAGNOSIS — J449 Chronic obstructive pulmonary disease, unspecified: Secondary | ICD-10-CM

## 2016-07-30 DIAGNOSIS — Z87891 Personal history of nicotine dependence: Secondary | ICD-10-CM

## 2016-07-30 DIAGNOSIS — Z8701 Personal history of pneumonia (recurrent): Secondary | ICD-10-CM | POA: Diagnosis not present

## 2016-07-30 MED ORDER — PREDNISONE 10 MG PO TABS: ORAL_TABLET | ORAL | 0 refills | Status: DC

## 2016-07-30 MED ORDER — CEPHALEXIN 500 MG PO CAPS: 500.0000 mg | ORAL_CAPSULE | Freq: Three times a day (TID) | ORAL | 0 refills | Status: DC

## 2016-07-30 NOTE — Progress Notes (Signed)
Subjective:     Patient ID: Mary Acosta, female   DOB: 06-Oct-1955, 60 y.o.   MRN: [id number]  HPI   PCP Thressa Sheller, MD   HPI  IOV 09/26/2014  Chief Complaint  Patient presents with  . Pulmonary Consult    Pt referred by Dr. Sherald Barge for pneumonia.     60 year-old morbidly obese female with multiple medical problems including obesity, chronic pain, diabetes says has a long history of COPD not otherwise specified and quit smoking 4 years ago [close to 100 pack smoking history]. At baseline prior to December 2015 she says she was quite functional without much of dyspnea for activities of daily living. Then in mid November 2015 had an admission for hyperglycemia. This was followed by an admission 08/26/2014 08/29/2014 for hospital-acquired pneumonia based on a CT scan of the chest 08/26/2014 that showed right lower lobe infiltrate but ruled out pulmonary embolism. Readmitted 09/08/2014 through gender 17 2016 for COPD exacerbation. Since these admissions she says she is no longer as functional. ECOG functional status is now f4. She spends most of the day sitting or in bed. Only movement is to the bathroom and back to the bed and maybe to the dining room. These activities make extremely dyspneic that she still barely able to do her functional activities such as changing clothes or taking a shower. These activities make extremely dyspneic. Since discharge from the hospital she's not using her Spiriva because of chest tightness from Spiriva. In fact her husband says that she's not using any of her inhalers. She reports class III-for dyspnea associated with mild cough that is dry.  She's not had any PFTs in a long time according to her history  Chest x-ray 09/08/2014 lung fields are clear  She needed a wheel chair to get into ouroffice due to dyspnea  She is not on o2 at home.  Walking desaturation test 185 feet x 3 laps on RA: did only 3/4 of 1 lap. STOPPED 2 tims. Pulse ox stated at  100%. HR rose to 107/min  11/01/2014 Follow up PFT /COPD? Patient returns for a follow-up visit and review PFT Patient was seen one month ago for pulmonary consultation for possible COPD. She is a former smoker had a recent hospital. They should for hospital-acquired pneumonia , and COPD exacerbation. Patient had persistent shortness of breath. He had no desaturations with walking in office. PFTs today showed an FEV1 of 82%, ratio 81, FVC at 79%, no significant bronchodilator response, decreased diffusing capacity is 57%. Patient is on an ACE inhibitor, chronic narcotics, and is mildly obese with weight at 291. Patient had a CT chest in January 2016 was negative for pulmonary embolism, showed a right lower lobe pneumonia, follow-up chest x-ray was negative 2-D echo done in January showed severe LVH, EF 55% , GR 1 DD  Tried spiriva in past, intolerant.  Started on Duoneb feels it helps her breathing.  Says she was rechecked for sleep apnea in past  and test was neg.  Previous lap band with loss of 60lbs (high wt was 346) . Wt starting to tr up.  Has chronic pain .-back, knee  and FM  Started on PT last ov. Hard on her knees.  She denies any chest pain, orthopnea, PND, or increased leg swelling  OV  01/18/2015  Chief Complaint  Patient presents with  . Follow-up    Pt stated she is feeling better since sleeping with O2. Pt c/o DOE, weakness--pt requesting a wheelchair.  Pt denies CP/tightness and cough.     Follow-up mild COPD with chronic respiratory failure in the setting of obesity, chronic pain and poor functional status  Presents with her husband. She says that oxygen therapy has helped. She still got poor functional status due to obesity and pain. She wants a wheelchair. She is compliant with her oxygen and nebulizers. There is no new issues.    TEST  10/2014 PFTs today showed an FEV1 of 82%, ratio 81, FVC at 79%, no significant bronchodilator response, decreased diffusing capacity is  57%. 2-D echo done in January showed severe LVH, EF 55% , GR 1 DD  Tried spiriva in past, intolerant.  2016 ONO showed nocturnal desats she was started on O2  2016 sleep study which was neg for OSA.    10/23/2015 Follow up : CODP and Chronic O2  Pt returns for 9 month follow up for COPD .  Remains on Duoneb Four times a day   Says her cough and wheezing are much better since stopping Lisinopril last year.  Denies hemoptysis , chest pain, orthopnea, or increased edema.   Says she was recently dx with fatty liver/non alcoholic cirrhosis .  Has chronic pain on morphine and dilaudid. Followed [facility].  Flu and PVX utd.  Would like to get Prevnar vaccine today.    OV 04/30/2016  Chief Complaint  Patient presents with  . Follow-up    Pt reports her breathing is fair. C/o congestion, prod cough (green phlem).    Follow-up mild/moderate COPD but with high symptom burden because of multiple medical problems obesity and chronic opioid use  Last visit her in 2017 with nurse practitioner. Since then May 2017 she had recurrent C. difficile she apparently was a fifth episode. Overall she's stable but for the last week she is reporting increased cough compared to baseline and change in color of sputum to green color but no increased shortness of breath or wheeze or edema or chest pain or orthopnea or nocturnal dyspnea. This no fever or chills.  OV 07/30/2016  Chief Complaint  Patient presents with  . Follow-up    Increased cough for the past wk- prod with large amounts of green/brown sputum.  She states "I am probably more SOB"  She has been using albuterol inhaler 2 x daily on average and also is using Duonebs 2 x daily.      Follow-up mild COPD with isolated low dlco in the setting of obesity, chronic pain and poor functional status  - Presents with her husband for follow-up. For the last 2 weeks has had increased cough, brown sputum, wheezing and chest tightness all consistent  with a COPD exacerbation. Suspected sick contacts but she's not sure. She also run out of her Combivent rescue MDI but she uses DuoNeb 4 times daily now compliant basis. This no fever or chills. She think she'll benefit from antibiotic and prednisone.  Last CT chest in 2016 showed pneumonia in the only follow-up was in April 2017 that showed clearance with a chest x-ray but no CT chest  No alpha 1 check so far    Results for LEONTINE, GLADEN (MRN [id number]) as of 07/30/2016 11:21  Ref. Range 11/01/2014 09:59  FVC-Post Latest Units: L 3.19  FVC-%Pred-Post Latest Units: % 77  FVC-%Change-Post Latest Units: % -2  FEV1-Post Latest Units: L 2.67  FEV1-%Pred-Post Latest Units: % 83  Results for JACELIN, EDMON (MRN [id number]) as of 07/30/2016 11:21  Ref.  Range 11/01/2014 09:59  DLCO unc Latest Units: ml/min/mmHg 18.56  DLCO unc % pred Latest Units: % 57    has a past medical history of Anxiety; Arthritis; Asthma; Cervical cancer ([place]); Chronic lower back pain; Chronic narcotic use; Chronic pain syndrome; Cirrhosis (HCC); COPD (chronic obstructive pulmonary disease) ([place]); Daily headache; Depression; DJD (degenerative joint disease); Fatty liver disease, nonalcoholic; Fibromyalgia; Frequency of urination; HCAP (healthcare-associated pneumonia) (08/26/2014); History of TIA (transient ischemic attack) (11-01-2010); Hyperlipidemia; Hypertension; Hypothyroidism; IDDM (insulin dependent diabetes mellitus) ([place]); Insomnia; Lumbar stenosis; Memory difficulty (07/25/2016); Nocturia; OSA (obstructive sleep apnea); Osteoarthritis; Pneumonia ("several times"); Polymyalgia rheumatica ([place]); Scoliosis; Seasonal allergies; Thyroid cancer ([place]); Urgency of urination; and Vaginal pain (S/P SLING  FEB 2012).   reports that she quit smoking about 5 years ago. Her smoking use included Cigarettes. She has a 97.50 pack-year smoking history. She has never used smokeless tobacco.  Past Surgical History:  Procedure  Laterality Date  . APPENDECTOMY  1982  . BREAST LUMPECTOMY Left 02-28-2005   ATYPICAL DUCTAL HYPERPLASIA  . CARDIAC CATHETERIZATION  09-04-2004   NORMAL CORONARY ANATOMY/ NORMAL LVF/ EF 60%  . CARDIOVASCULAR STRESS TEST  12-27-2010  DR [place]   ABNORMAL NUCLEAR STUDY W/ /MILD INFERIOR ISCHEMIA/ EF 69%/  CT HEART ANGIOGRAM ;  NO ACUTE FINDINGS  . CRYOABLATION  05/16/2003   w/LEEP FOR ABNORMAL PAP SMEAR  . CYSTOSCOPY  05/18/2012   Procedure: CYSTOSCOPY;  Surgeon: Reece Packer, MD;  Location: [facility];  Service: Urology;  Laterality: N/A;  examination under anethesia  . HYSTEROSCOPY W/D&C  08-19-2007   PMB  . KNEE ARTHROSCOPY W/ DEBRIDEMENT Left 03/29/2006   INTERNAL DERANGEMENT/ SEVERE DJD/ MENISCUS TEARS  . LAPAROSCOPIC CHOLECYSTECTOMY  06-10-2002  . LAPAROSCOPIC GASTRIC BANDING  03/01/2006   TRUNCAL VAGOTOMY/ PLACEMENT OF VG BAND  . REVISION TOTAL KNEE ARTHROPLASTY Left 08-29-2008; 05/2009  . TONSILLECTOMY  1969  . TOTAL KNEE ARTHROPLASTY Left 01-23-2007   SEVERE DJD  . TOTAL THYROIDECTOMY  11-22-2005   BILATERAL THYROID NODULES-- PAPILLARY CARCINOMA (0.5CM)/ ADENOMATOID NODULES  . TRANSTHORACIC ECHOCARDIOGRAM  12-27-2010   LVSF NORMAL / EF [id number] GRADE I DIASTOLIC DYSFUNCTION/ MILD MITRAL REGURG. / MILDLY DILATED LEFT ATRIUM/ MILDY INCREASED SYSTOLIC PRESSURE OF PULMONARY ARTERIES  . TRANSVAGINAL SUBURETERAL TAPE/ SLING  09-28-2010   MIXED URINARY INCONTINENCE  . TUBAL LIGATION  1983    Allergies  Allergen Reactions  . Gabapentin Swelling    Swelling in legs  . Losartan Other (See Comments)    Myalgias and muscle cramping  . Oxycodone Itching  . Sulfa Antibiotics Nausea Only and Rash  . Sulfonamide Derivatives Nausea Only    Immunization History  Administered Date(s) Administered  . Influenza,inj,Quad PF,36+ Mos 05/27/2015, 05/30/2016  . Influenza-Unspecified 05/26/2014  . Pneumococcal Conjugate-13 10/23/2015  . Pneumococcal Polysaccharide-23  07/10/2014    Family History  Problem Relation Age of Onset  . Diabetes Mother   . Heart disease Mother   . Dementia Mother   . Heart disease Father   . High blood pressure Father   . Colon cancer Maternal Uncle     x 2  . Breast cancer Other     great aunts x 5     Current Outpatient Prescriptions:  .  acetaminophen (TYLENOL) 500 MG tablet, Take 500 mg by mouth every 4 (four) hours as needed for moderate pain., Disp: , Rfl:  .  albuterol (PROVENTIL HFA;VENTOLIN HFA) 108 (90 BASE) MCG/ACT inhaler, Inhale 2 puffs into the lungs every 6 (six) hours  as needed for wheezing or shortness of breath., Disp: 1 Inhaler, Rfl: 2 .  ALPRAZolam (XANAX) 0.5 MG tablet, Take 2 tablets approximately 45 minutes prior to the MRI study, take a third tablet if needed., Disp: 3 tablet, Rfl: 0 .  amitriptyline (ELAVIL) 50 MG tablet, Take 100 mg by mouth daily. , Disp: , Rfl: 5 .  cetirizine (ZYRTEC) 10 MG tablet, Take 10 mg by mouth daily as needed for allergies. , Disp: , Rfl:  .  desvenlafaxine (PRISTIQ) 50 MG 24 hr tablet, , Disp: , Rfl:  .  HYDROmorphone (DILAUDID) 4 MG tablet, Take 4 mg by mouth every 6 (six) hours as needed for moderate pain. , Disp: , Rfl: 0 .  hydrOXYzine (ATARAX/VISTARIL) 25 MG tablet, 25 mg 2 (two) times daily. , Disp: , Rfl:  .  insulin NPH Human (NOVOLIN N) 100 UNIT/ML injection, Inject 1.2 mLs (120 Units total) into the skin every morning., Disp: 40 mL, Rfl: 11 .  Insulin Syringe-Needle U-100 (INSULIN SYRINGE 1CC/30GX5/16") 30G X 5/16" 1 ML MISC, Use as directed for insulin injections 5 times daily, Disp: 150 each, Rfl: 1 .  ipratropium-albuterol (DUONEB) 0.5-2.5 (3) MG/3ML SOLN, Take 3 mLs by nebulization 4 (four) times daily. (Patient taking differently: Take 3 mLs by nebulization every 6 (six) hours as needed. ), Disp: 360 mL, Rfl: 11 .  levothyroxine (SYNTHROID, LEVOTHROID) 150 MCG tablet, Take 150 mcg by mouth daily before breakfast., Disp: , Rfl:  .  Melatonin 5 MG CAPS,  Take 5 mg by mouth at bedtime as needed (sleep). , Disp: , Rfl:  .  meloxicam (MOBIC) 7.5 MG tablet, Take 7.5 mg by mouth daily as needed for pain. , Disp: , Rfl: 5 .  morphine (MS CONTIN) 30 MG 12 hr tablet, Take 30 mg by mouth 2 (two) times daily., Disp: , Rfl:  .  naloxone (NARCAN) nasal spray 4 mg/0.1 mL, One spray in either nostril once for known/suspected opioid overdose. May repeat every 2-3 minutes in alternating nostril til EMS arrives, Disp: , Rfl:  .  polyethylene glycol (MIRALAX / GLYCOLAX) packet, Take 17 g by mouth 2 (two) times daily as needed for moderate constipation., Disp: 14 each, Rfl: 0 .  potassium chloride SA (K-DUR,KLOR-CON) 20 MEQ tablet, Take 2 tablets by mouth daily as needed for cramping., Disp: 180 tablet, Rfl: 0 .  promethazine (PHENERGAN) 25 MG tablet, Take 1 tablet (25 mg total) by mouth every 6 (six) hours as needed for nausea or vomiting., Disp: 20 tablet, Rfl: 0 .  Pseudoephedrine-Guaifenesin (MUCINEX D PO), Take by mouth., Disp: , Rfl:  .  tiZANidine (ZANAFLEX) 2 MG tablet, Take 2 mg by mouth 3 (three) times daily., Disp: , Rfl: 2 .  valsartan (DIOVAN) 80 MG tablet, Take 1 tablet (80 mg total) by mouth daily., Disp: 90 tablet, Rfl: 1   Review of Systems     Objective:   Physical Exam  Constitutional: She is oriented to person, place, and time. She appears well-developed and well-nourished. No distress.  obse  HENT:  Head: Normocephalic and atraumatic.  Right Ear: External ear normal.  Left Ear: External ear normal.  Mouth/Throat: Oropharynx is clear and moist. No oropharyngeal exudate.  Eyes: Conjunctivae and EOM are normal. Pupils are equal, round, and reactive to light. Right eye exhibits no discharge. Left eye exhibits no discharge. No scleral icterus.  Neck: Normal range of motion. Neck supple. No JVD present. No tracheal deviation present. No thyromegaly present.  Cardiovascular: Normal rate, regular  rhythm, normal heart sounds and intact distal  pulses.  Exam reveals no gallop and no friction rub.   No murmur heard. Pulmonary/Chest: Effort normal and breath sounds normal. No respiratory distress. She has no wheezes. She has no rales. She exhibits no tenderness.  Abdominal: Soft. Bowel sounds are normal. She exhibits no distension and no mass. There is no tenderness. There is no rebound and no guarding.  Musculoskeletal: Normal range of motion. She exhibits no edema or tenderness.  Lymphadenopathy:    She has no cervical adenopathy.  Neurological: She is alert and oriented to person, place, and time. She has normal reflexes. No cranial nerve deficit. She exhibits normal muscle tone. Coordination normal.  Skin: Skin is warm and dry. No rash noted. She is not diaphoretic. No erythema. No pallor.  Psychiatric: She has a normal mood and affect. Her behavior is normal. Judgment and thought content normal.  Vitals reviewed.   Vitals:   07/30/16 1119  BP: 124/88  Pulse: 84  SpO2: 94%  Weight: 278 lb (126.1 kg)  Height: 5\' 9"  (1.753 m)   Body mass index is 41.05 kg/m.       Assessment:       ICD-9-CM ICD-10-CM   1. Chronic obstructive pulmonary disease with acute exacerbation (HCC) 491.21 J44.1   2. Chronic obstructive pulmonary disease, unspecified COPD type ([place]) 496 J44.9   3. History of bacterial pneumonia V12.61 Z87.01   4. History of smoking greater than 50 pack years V15.82 Z87.891        Plan:      Cephalexin 500mg  three times daily x 5 days Please take prednisone 40 mg x1 day, then 30 mg x1 day, then 20 mg x1 day, then 10 mg x1 day, and then 5 mg x1 day and stop  Do CT chest wo contrast Do alpha 1 genetic blood test for copd  Continue duoneb 4 times daily + combivent inhaler as needed  followup - will call with results  - otherwise 6 months or sooner if needed  Dr. Brand Males, M.D., [facility].C.P Pulmonary and Critical Care Medicine Staff Physician [place] [facility] Pager: [phone], If no answer or between  15:00h - 7:00h: call [phone]  07/30/2016 11:28 AM

## 2016-07-30 NOTE — Patient Instructions (Addendum)
ICD-9-CM ICD-10-CM   1. Chronic obstructive pulmonary disease with acute exacerbation (HCC) 491.21 J44.1   2. Chronic obstructive pulmonary disease, unspecified COPD type ([place]) 496 J44.9   3. History of bacterial pneumonia V12.61 Z87.01   4. History of smoking greater than 50 pack years V15.82 Z87.891    Cephalexin 500mg  three times daily x 5 days Please take prednisone 40 mg x1 day, then 30 mg x1 day, then 20 mg x1 day, then 10 mg x1 day, and then 5 mg x1 day and stop  Do CT chest wo contrast Do alpha 1 genetic blood test for copd  Continue duoneb 4 times daily + combivent inhaler as needed  followup - will call with results  - otherwise 6 months or sooner if needed

## 2016-08-05 ENCOUNTER — Ambulatory Visit (INDEPENDENT_AMBULATORY_CARE_PROVIDER_SITE_OTHER)
Admission: RE | Admit: 2016-08-05 | Discharge: 2016-08-05 | Disposition: A | Discharge status: Home / Self Care | Payer: Medicare Other | Source: Ambulatory Visit | Attending: Internal Medicine | Admitting: Internal Medicine

## 2016-08-05 DIAGNOSIS — Z8701 Personal history of pneumonia (recurrent): Secondary | ICD-10-CM | POA: Diagnosis not present

## 2016-08-05 DIAGNOSIS — J441 Chronic obstructive pulmonary disease with (acute) exacerbation: Secondary | ICD-10-CM | POA: Diagnosis not present

## 2016-08-05 DIAGNOSIS — J449 Chronic obstructive pulmonary disease, unspecified: Secondary | ICD-10-CM | POA: Diagnosis not present

## 2016-08-05 DIAGNOSIS — Z87891 Personal history of nicotine dependence: Secondary | ICD-10-CM | POA: Diagnosis not present

## 2016-08-05 DIAGNOSIS — R0602 Shortness of breath: Secondary | ICD-10-CM | POA: Diagnosis not present

## 2016-08-05 LAB — ALPHA-1 ANTITRYPSIN PHENOTYPE: A-1 Antitrypsin: 201 mg/dL — ABNORMAL HIGH (ref 83–199)

## 2016-08-06 ENCOUNTER — Telehealth: Payer: Self-pay | Admitting: *Deleted

## 2016-08-06 ENCOUNTER — Ambulatory Visit: Payer: Medicare Other | Admitting: Internal Medicine

## 2016-08-06 ENCOUNTER — Other Ambulatory Visit: Payer: Self-pay | Admitting: Nurse Practitioner

## 2016-08-06 NOTE — Telephone Encounter (Signed)
Patient has not been to GI MD, Dr. Pincus Sanes yet.  Appt is scheduled for 12/178/17.   RN reviewed Dr. Storm Frisk last office note which reflected that the patient did need to see a GI MD so that a consultation could occur about possible fecal transplant.  Ms Marcello Moores' appt was rescheduled for 08/27/16 with Dr. Baxter Flattery.

## 2016-08-09 ENCOUNTER — Ambulatory Visit
Admission: RE | Admit: 2016-08-09 | Discharge: 2016-08-09 | Disposition: A | Discharge status: Home / Self Care | Payer: Medicare Other | Source: Ambulatory Visit | Attending: Neurology | Admitting: Neurology

## 2016-08-09 DIAGNOSIS — R413 Other amnesia: Secondary | ICD-10-CM | POA: Diagnosis not present

## 2016-08-09 NOTE — Progress Notes (Signed)
Called and spoke to pt. Informed her of the results per MR. Pt verbalized understanding and denied any further questions or concerns at this time.  

## 2016-08-11 ENCOUNTER — Telehealth: Payer: Self-pay | Admitting: Neurology

## 2016-08-11 NOTE — Telephone Encounter (Signed)
I called the patient. The MRI of the brain is OK. Memory disorder may be secondary to medications.

## 2016-08-12 ENCOUNTER — Telehealth: Payer: Self-pay | Admitting: Internal Medicine

## 2016-08-12 ENCOUNTER — Ambulatory Visit (INDEPENDENT_AMBULATORY_CARE_PROVIDER_SITE_OTHER): Payer: Medicare Other | Admitting: Gastroenterology

## 2016-08-12 ENCOUNTER — Other Ambulatory Visit (INDEPENDENT_AMBULATORY_CARE_PROVIDER_SITE_OTHER): Payer: Medicare Other

## 2016-08-12 ENCOUNTER — Encounter: Payer: Self-pay | Admitting: Gastroenterology

## 2016-08-12 VITALS — BP 122/78 | HR 108 | Ht 68.11 in | Wt 288.0 lb

## 2016-08-12 DIAGNOSIS — J449 Chronic obstructive pulmonary disease, unspecified: Secondary | ICD-10-CM | POA: Diagnosis not present

## 2016-08-12 DIAGNOSIS — K7460 Unspecified cirrhosis of liver: Secondary | ICD-10-CM

## 2016-08-12 DIAGNOSIS — A0471 Enterocolitis due to Clostridium difficile, recurrent: Secondary | ICD-10-CM

## 2016-08-12 DIAGNOSIS — I2584 Coronary atherosclerosis due to calcified coronary lesion: Principal | ICD-10-CM

## 2016-08-12 DIAGNOSIS — I2510 Atherosclerotic heart disease of native coronary artery without angina pectoris: Secondary | ICD-10-CM

## 2016-08-12 DIAGNOSIS — R110 Nausea: Secondary | ICD-10-CM

## 2016-08-12 LAB — CBC WITH DIFFERENTIAL/PLATELET
Basophils Absolute: 0.1 10*3/uL (ref 0.0–0.1)
Basophils Relative: 0.6 % (ref 0.0–3.0)
EOS PCT: 3.1 % (ref 0.0–5.0)
Eosinophils Absolute: 0.3 10*3/uL (ref 0.0–0.7)
HCT: 38.9 % (ref 36.0–46.0)
Hemoglobin: 12.8 g/dL (ref 12.0–15.0)
LYMPHS ABS: 2.7 10*3/uL (ref 0.7–4.0)
Lymphocytes Relative: 32.3 % (ref 12.0–46.0)
MCHC: 33 g/dL (ref 30.0–36.0)
MCV: 75.7 fl — AB (ref 78.0–100.0)
MONOS PCT: 6.3 % (ref 3.0–12.0)
Monocytes Absolute: 0.5 10*3/uL (ref 0.1–1.0)
NEUTROS ABS: 4.9 10*3/uL (ref 1.4–7.7)
NEUTROS PCT: 57.7 % (ref 43.0–77.0)
PLATELETS: 160 10*3/uL (ref 150.0–400.0)
RBC: 5.14 Mil/uL — ABNORMAL HIGH (ref 3.87–5.11)
RDW: 14.4 % (ref 11.5–15.5)
WBC: 8.4 10*3/uL (ref 4.0–10.5)

## 2016-08-12 LAB — COMPREHENSIVE METABOLIC PANEL
ALK PHOS: 114 U/L (ref 39–117)
ALT: 21 U/L (ref 0–35)
AST: 24 U/L (ref 0–37)
Albumin: 4 g/dL (ref 3.5–5.2)
BUN: 14 mg/dL (ref 6–23)
CHLORIDE: 95 meq/L — AB (ref 96–112)
CO2: 29 meq/L (ref 19–32)
Calcium: 10.4 mg/dL (ref 8.4–10.5)
Creatinine, Ser: 1.13 mg/dL (ref 0.40–1.20)
GFR: 52.18 mL/min — AB (ref >60.00)
GLUCOSE: 332 mg/dL — AB (ref 70–99)
POTASSIUM: 4.2 meq/L (ref 3.5–5.1)
Sodium: 133 mEq/L — ABNORMAL LOW (ref 135–145)
Total Bilirubin: 0.3 mg/dL (ref 0.2–1.2)
Total Protein: 7.5 g/dL (ref 6.0–8.3)

## 2016-08-12 LAB — PROTIME-INR
INR: 1.2 ratio — ABNORMAL HIGH (ref 0.8–1.0)
Prothrombin Time: 12.4 s (ref 9.6–13.1)

## 2016-08-12 MED ORDER — PROMETHAZINE HCL 25 MG PO TABS: 25.0000 mg | ORAL_TABLET | Freq: Three times a day (TID) | ORAL | 3 refills | Status: DC | PRN

## 2016-08-12 NOTE — Progress Notes (Signed)
[facility] Progress Note  Chief Complaint: Cirrhosis  Subjective  History:  This is a 60 year old woman with multiple medical problems as outlined in my initial office note from February 2017. She has cirrhosis, most likely on the basis of NAFLD, as well as a chronic pain syndrome and poorly controlled diabetes with a recent hemoglobin A1c of 12.4. She had recurrent C. difficile colitis starting last spring and into the late summer, finally cleared with a tapering dose of vancomycin given by infectious disease. She had a negative stool toxin at the end of therapy. She admits to poor compliance with her diet, and says that when she is anxious she will eat "whenever I want". She has no episodes of confusion according to her husband, no gait unsteadiness, and has no history of GI bleeding. ROS: Cardiovascular:  no chest pain Respiratory: no dyspnea She has chronic back and joint pain. She also reports frequent nausea, which improves with Phenergan. The patient's Past Medical, Family and Social History were reviewed and are on file in the EMR.  Objective:  Med list reviewed  Vital signs in last 24 hrs: Vitals:   08/12/16 1313  BP: 122/78  Pulse: (!) 108    Physical Exam  Chronically ill-appearing woman who looks older than stated age  65: sclera anicteric, oral mucosa moist without lesions  Neck: supple, no thyromegaly, JVD or lymphadenopathy  Cardiac: RRR without murmurs, S1S2 heard, no peripheral edema  Pulm: Globally decreased breath sounds bilaterally, normal RR and effort noted  Abdomen: Morbidly obese, soft, mild epigastric tenderness, with active bowel sounds. No guarding or palpable hepatosplenomegaly. Lap band port is felt in the upper abdominal wall.  Skin; warm and dry, no jaundice or rash  Recent Labs:  Hgb A1c 12.4 on 05/16/16  Radiologic studies:  Recent chest CT so's changes of cirrhosis with possible infiltrate in the left lung. She has cirrhotic  changes of the liver and probable pulmonary hypertension.  @ASSESSMENTPLANBEGIN @ Assessment: Encounter Diagnoses  Name Primary?  . Cirrhosis of liver without ascites, unspecified hepatic cirrhosis type ([place]) Yes  . Recurrent Clostridium difficile diarrhea   . Chronic obstructive pulmonary disease, unspecified COPD type ([place])   . Nausea without vomiting      She unquestionably needs improved compliance with diabetic regimen.  C. difficile appears to have cleared.   She is due for hepatoma and variceal screening. It does not seem she is likely to get better compliance and therefore controlled diabetes. Her procedure must be done at the hospital due to comorbidities.  Plan: Upper endoscopy  The benefits and risks of the planned procedure were described in detail with the patient or (when appropriate) their health care proxy.  Risks were outlined as including, but not limited to, bleeding, infection, perforation, adverse medication reaction leading to cardiac or pulmonary decompensation, or pancreatitis (if ERCP).  The limitation of incomplete mucosal visualization was also discussed.  No guarantees or warranties were given.  AFP, CBC, CMP, INR Right upper quadrant ultrasound Phenergan was prescribed   Total time 30  minutes, over half spent in counseling and coordination of care.   Mary Acosta

## 2016-08-12 NOTE — Patient Instructions (Signed)
If you are age 60 or older, your body mass index should be between 23-30. Your Body mass index is 43.65 kg/m. If this is out of the aforementioned range listed, please consider follow up with your Primary Care Provider.  If you are age 63 or younger, your body mass index should be between 19-25. Your Body mass index is 43.65 kg/m. If this is out of the aformentioned range listed, please consider follow up with your Primary Care Provider.   Your physician has requested that you go to the basement for lab work before leaving today.  You have been scheduled for an abdominal ultrasound at [facility] (1st floor of hospital) on 08-20-2016 at [place]. Please arrive 15 minutes prior to your appointment for registration. Make certain not to have anything to eat or drink 6 hours prior to your appointment. Should you need to reschedule your appointment, please contact radiology at [phone]. This test typically takes about 30 minutes to perform.  We will have to contact you in Jan 2018 to schedule the EGD/upper endoscopy at [facility].  Thank you for choosing [facility]  Dr Wilfrid Lund III

## 2016-08-12 NOTE — Telephone Encounter (Signed)
Called and spoke to pt. Pt is requesting the results of CT chest from 08/05/16.   Dr. Chase Caller please advise. Thanks.

## 2016-08-13 LAB — AFP TUMOR MARKER: AFP TUMOR MARKER: 2.9 ng/mL (ref <6.1)

## 2016-08-13 NOTE — Telephone Encounter (Signed)
    IMPRESSION: 1. Minimal scattered peribronchovascular ground-glass in the lungs bilaterally with minimal consolidation in the left lower lobe. ->  Findings are indicative of an infectious bronchiolitis/bronchopneumonia. An atypical infectious process is not excluded.-> already gave her abx at last visit. Plan: do repeat ct chest wo contrast in 3 months 2.  Aortic atherosclerosis (ICD10-170.0).->  does have coronary artery calcification and if no normal cardiac stress test past few years; please refer to cardiologist - [facility] or Dr Einar Gip, first available  3. Enlarged pulmonary arteries, indicative of pulmonary arterial Hypertension.-> same as aboe 4. Liver appears cirrhotic.-> she has seen gi already   Electronically Signed   By: Lorin Picket M.D.   On: 08/05/2016 09:50

## 2016-08-13 NOTE — Telephone Encounter (Signed)
The infection was praobasbly related to the time when I gave her antibiotic. I expect it to go away. We will seee how the cT looks  CAD calcikfication and Pulm htn - cards can address this  Dr. Brand Males, M.D., [facility].C.P Pulmonary and Critical Care Medicine Staff Physician [facility] Pager: [phone], If no answer or between  15:00h - 7:00h: call [phone]  08/13/2016 5:51 PM

## 2016-08-13 NOTE — Telephone Encounter (Signed)
Called spoke with patient Discussed her CT results/recs as stated by MR MyChart e-mail sent to patient with MR's recs as well  MR pt has additional questions/concerns: 1. Is the infection and mention of PAH part of the normal course of COPD? 2. She was very concerned about the mention of PAH and would like some additional insight into this  She has NOT had a stress test in 10-12 yrs - referral to cardiology has been placed (pt requests before the end of the year d/t deductible)  Please advise, thank you

## 2016-08-14 NOTE — Telephone Encounter (Signed)
Spoke with pt. She is aware of MR's response. Pt states that she has an appointment with Cardiology on 09/05/16. Nothing further was needed.

## 2016-08-15 ENCOUNTER — Telehealth: Payer: Self-pay | Admitting: Emergency Medicine

## 2016-08-15 NOTE — Telephone Encounter (Signed)
Handicap form has been mailed to pt.

## 2016-08-20 ENCOUNTER — Ambulatory Visit (HOSPITAL_COMMUNITY): Payer: Medicare Other

## 2016-08-23 ENCOUNTER — Ambulatory Visit (HOSPITAL_COMMUNITY)
Admission: RE | Admit: 2016-08-23 | Discharge: 2016-08-23 | Disposition: A | Discharge status: Home / Self Care | Payer: Medicare Other | Source: Ambulatory Visit | Attending: Gastroenterology | Admitting: Gastroenterology

## 2016-08-23 DIAGNOSIS — R110 Nausea: Secondary | ICD-10-CM

## 2016-08-23 DIAGNOSIS — J449 Chronic obstructive pulmonary disease, unspecified: Secondary | ICD-10-CM | POA: Diagnosis present

## 2016-08-23 DIAGNOSIS — A0471 Enterocolitis due to Clostridium difficile, recurrent: Secondary | ICD-10-CM

## 2016-08-23 DIAGNOSIS — K7460 Unspecified cirrhosis of liver: Secondary | ICD-10-CM

## 2016-08-27 ENCOUNTER — Ambulatory Visit: Payer: Medicare Other | Admitting: Internal Medicine

## 2016-08-29 DIAGNOSIS — J449 Chronic obstructive pulmonary disease, unspecified: Secondary | ICD-10-CM | POA: Diagnosis not present

## 2016-08-30 ENCOUNTER — Other Ambulatory Visit: Payer: Self-pay

## 2016-08-30 DIAGNOSIS — K7460 Unspecified cirrhosis of liver: Secondary | ICD-10-CM

## 2016-08-30 NOTE — Progress Notes (Signed)
Referring-Dr Chase Caller  HPI: 61 year old female for evaluation of coronary calcification and pulmonary hypertension. Patient had a cardiac catheterization in 2006 which was normal. Carotid Dopplers March 2012 showed no significant obstructive disease. Nuclear study 5/12 suggested mild inferior ischemia and EF 69. Cardiac CT May 2012 showed a calcium score of 0 and no plaque or stenosis in the coronary arteries. Echocardiogram 1/16 showed normal LV function. Patient recently had a CT of her chest which showed bronchiolitis/bronchopneumonia. There was atherosclerosis and pulmonary HTN and cardiology asked to evaluate. Note there is also cirrhosis noted. Patient does have dyspnea on exertion but no orthopnea or PND. Occasional mild pedal edema. Patient does occasionally have pain in the right rib area and also in the substernal area. Lasts approximately 45 seconds with no associated symptoms. The substernal pain can radiate to the back. She does not have exertional chest pain. Cardiology asked to evaluate for pulmonary hypertension and coronary calcification.   Current Outpatient Prescriptions  Medication Sig Dispense Refill  . albuterol (PROVENTIL HFA;VENTOLIN HFA) 108 (90 BASE) MCG/ACT inhaler Inhale 2 puffs into the lungs every 6 (six) hours as needed for wheezing or shortness of breath. 1 Inhaler 2  . amitriptyline (ELAVIL) 50 MG tablet Take 100 mg by mouth daily.   5  . desvenlafaxine (PRISTIQ) 50 MG 24 hr tablet     . HYDROmorphone (DILAUDID) 4 MG tablet Take 4 mg by mouth every 6 (six) hours as needed for moderate pain.   0  . hydrOXYzine (ATARAX/VISTARIL) 25 MG tablet Take 25 mg by mouth 2 (two) times daily.     . insulin NPH Human (NOVOLIN N) 100 UNIT/ML injection Inject 1.2 mLs (120 Units total) into the skin every morning. 40 mL 11  . Insulin Syringe-Needle U-100 (INSULIN SYRINGE 1CC/30GX5/16") 30G X 5/16" 1 ML MISC Use as directed for insulin injections 5 times daily 150 each 1  .  ipratropium-albuterol (DUONEB) 0.5-2.5 (3) MG/3ML SOLN Take 3 mLs by nebulization 4 (four) times daily. (Patient taking differently: Take 3 mLs by nebulization every 6 (six) hours as needed. ) 360 mL 11  . levothyroxine (SYNTHROID, LEVOTHROID) 150 MCG tablet Take 150 mcg by mouth daily before breakfast.    . meloxicam (MOBIC) 7.5 MG tablet Take 7.5 mg by mouth 2 (two) times daily.   5  . metoCLOPramide (REGLAN) 5 MG tablet Take 1 tablet (5 mg total) by mouth 3 (three) times daily before meals. 90 tablet 2  . morphine (MS CONTIN) 30 MG 12 hr tablet Take 30 mg by mouth 2 (two) times daily.    . potassium chloride SA (K-DUR,KLOR-CON) 20 MEQ tablet Take 2 tablets by mouth daily as needed for cramping. 180 tablet 0  . tiZANidine (ZANAFLEX) 2 MG tablet Take 2 mg by mouth 3 (three) times daily.  2  . valsartan (DIOVAN) 80 MG tablet TAKE 1 TABLET BY MOUTH DAILY 90 tablet 2   No current facility-administered medications for this visit.     Allergies  Allergen Reactions  . Gabapentin Swelling    Swelling in legs  . Losartan Other (See Comments)    Myalgias and muscle cramping  . Oxycodone Itching  . Sulfa Antibiotics Nausea Only and Rash  . Sulfonamide Derivatives Nausea Only     Past Medical History:  Diagnosis Date  . Anxiety    with panic attacks  . Arthritis    "back; feet; hands; shoulders" (08/26/2014)  . Asthma   . Cervical cancer ([place])   . Chronic lower  back pain   . Chronic narcotic use   . Chronic pain syndrome    [facility]  . Cirrhosis ([place])   . COPD (chronic obstructive pulmonary disease) ([place])   . Daily headache   . Depression   . DJD (degenerative joint disease)   . Fatty liver disease, nonalcoholic   . Fibromyalgia   . Frequency of urination   . HCAP (healthcare-associated pneumonia) 08/26/2014  . History of TIA (transient ischemic attack) 11-01-2010   NO RESIDUAL  . Hyperlipidemia   . Hypertension   . Hypothyroidism   . IDDM (insulin dependent  diabetes mellitus) ([place])   . Insomnia   . Lumbar stenosis   . Memory difficulty 07/25/2016  . Nocturia   . OSA (obstructive sleep apnea)    NO CPAP SINCE WT LOSS  . Osteoarthritis    with severe disease in knee  . Pneumonia "several times"  . Polymyalgia rheumatica ([place])   . Scoliosis   . Seasonal allergies   . Thyroid cancer ([place])   . Urgency of urination   . Vaginal pain S/P SLING  FEB 2012    Past Surgical History:  Procedure Laterality Date  . APPENDECTOMY  1982  . BREAST LUMPECTOMY Left 02-28-2005   ATYPICAL DUCTAL HYPERPLASIA  . CARDIAC CATHETERIZATION  09-04-2004   NORMAL CORONARY ANATOMY/ NORMAL LVF/ EF 60%  . CARDIOVASCULAR STRESS TEST  12-27-2010  DR [place]   ABNORMAL NUCLEAR STUDY W/ /MILD INFERIOR ISCHEMIA/ EF 69%/  CT HEART ANGIOGRAM ;  NO ACUTE FINDINGS  . CRYOABLATION  05/16/2003   w/LEEP FOR ABNORMAL PAP SMEAR  . CYSTOSCOPY  05/18/2012   Procedure: CYSTOSCOPY;  Surgeon: Reece Packer, MD;  Location: [facility];  Service: Urology;  Laterality: N/A;  examination under anethesia  . HYSTEROSCOPY W/D&C  08-19-2007   PMB  . KNEE ARTHROSCOPY W/ DEBRIDEMENT Left 03/29/2006   INTERNAL DERANGEMENT/ SEVERE DJD/ MENISCUS TEARS  . LAPAROSCOPIC CHOLECYSTECTOMY  06-10-2002  . LAPAROSCOPIC GASTRIC BANDING  03/01/2006   TRUNCAL VAGOTOMY/ PLACEMENT OF VG BAND  . REVISION TOTAL KNEE ARTHROPLASTY Left 08-29-2008; 05/2009  . TONSILLECTOMY  1969  . TOTAL KNEE ARTHROPLASTY Left 01-23-2007   SEVERE DJD  . TOTAL THYROIDECTOMY  11-22-2005   BILATERAL THYROID NODULES-- PAPILLARY CARCINOMA (0.5CM)/ ADENOMATOID NODULES  . TRANSTHORACIC ECHOCARDIOGRAM  12-27-2010   LVSF NORMAL / EF XX123456 GRADE I DIASTOLIC DYSFUNCTION/ MILD MITRAL REGURG. / MILDLY DILATED LEFT ATRIUM/ MILDY INCREASED SYSTOLIC PRESSURE OF PULMONARY ARTERIES  . TRANSVAGINAL SUBURETERAL TAPE/ SLING  09-28-2010   MIXED URINARY INCONTINENCE  . TUBAL LIGATION  1983    Social History   Social History   . Marital status: Married    Spouse name: N/A  . Number of children: 2  . Years of education: 12   Occupational History  . disabled Unemployed   Social History Main Topics  . Smoking status: Former Smoker    Packs/day: 2.50    Years: 39.00    Types: Cigarettes    Quit date: 10/22/2010  . Smokeless tobacco: Never Used  . Alcohol use No  . Drug use: No  . Sexual activity: Not Currently   Other Topics Concern  . Not on file   Social History Narrative   Lives at home w/ her husband and grandson   Right-handed   Caffeine: 1 cup of coffee per week + [employer]    Family History  Problem Relation Age of Onset  . Diabetes Mother   . Heart  disease Mother   . Dementia Mother   . Heart disease Father   . High blood pressure Father   . Colon cancer Maternal Uncle     x 2  . Breast cancer Other     great aunts x 5    ROS: Chronic back and knee pain but no fevers or chills, productive cough, hemoptysis, dysphasia, odynophagia, melena, hematochezia, dysuria, hematuria, rash, seizure activity, orthopnea, PND, claudication. Remaining systems are negative.  Physical Exam:   Blood pressure (!) 156/82, pulse 89, height 5\' 8"  (1.727 m), weight 286 lb (129.7 kg).  General:  Well developed/morbidly obese in NAD Skin warm/dry Patient not depressed No peripheral clubbing Back-normal HEENT-normal/normal eyelids Neck supple/normal carotid upstroke bilaterally; no bruits; no JVD; no thyromegaly chest - CTA/ normal expansion CV - RRR/normal S1 and S2; no murmurs, rubs or gallops;  PMI nondisplaced Abdomen -NT/ND, no HSM, no mass, + bowel sounds, no bruit 2+ femoral pulses, no bruits Ext-no edema, chords; diminished distal pulses Neuro-grossly nonfocal  ECG -Sinus rhythm at a rate of 89. Right bundle branch block. Low voltage.  A/P  1 pulmonary hypertension-noted on CT scan. We will check echocardiogram for RV function and estimate of pulmonary pressure. This is likely related to her  history of COPD, obstructive sleep apnea (she does use oxygen at night) and obesity hypoventilation syndrome. We discussed the importance of weight loss. She will need to follow-up with pulmonary for treatment of COPD and obstructive sleep apnea.  2 coronary calcification-patient does not have exertional chest pain. She did not have coronary disease on previous evaluations. Given diabetes mellitus I have recommended aspirin 81 mg daily and Lipitor 40 mg daily. We will check lipids and liver in 4 weeks.  3 obesity-we discussed the importance of weight loss.  4 hypertension-blood pressure is elevated. Increase valsartan to 160 mg daily. Check potassium and renal function in 4 weeks. She will bring her blood pressure cuff and we will correlate with ours to make sure accurate. They will follow this at home and we will adjust based on follow-up readings.  5 hyperlipidemia-add statin as outlined above.  6 diabetes mellitus-management per primary care.  Kirk Ruths, MD

## 2016-09-02 ENCOUNTER — Encounter (HOSPITAL_COMMUNITY): Payer: Self-pay | Admitting: *Deleted

## 2016-09-02 IMAGING — US US SOFT TISSUE HEAD/NECK
1 series · 14 of 25 positions shown · non-contrast
Comparison: None.

CLINICAL DATA: Palpable left neck lump.

EXAM:
ULTRASOUND OF HEAD/NECK SOFT TISSUES
TECHNIQUE: Ultrasound examination of the head and neck soft tissues was
performed in the area of clinical concern.

[Series 1: us soft tissue head/neck · 0.06mm/px · 14 of 29 slices shown]
[im 1/29]
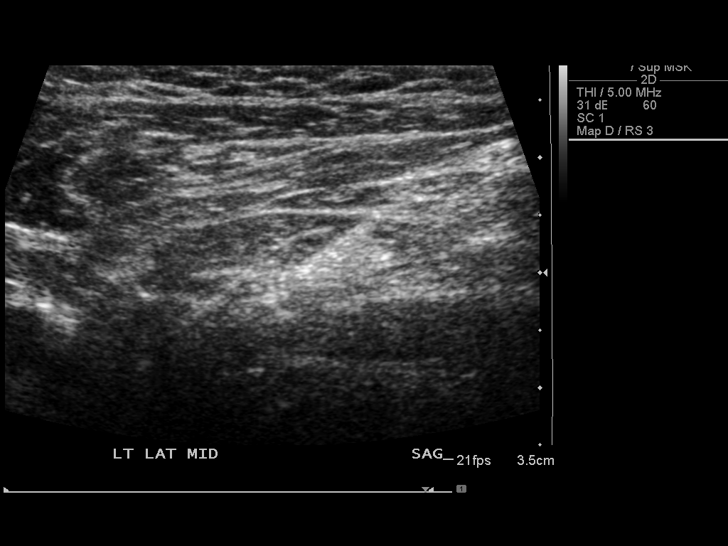
[im 3/29]
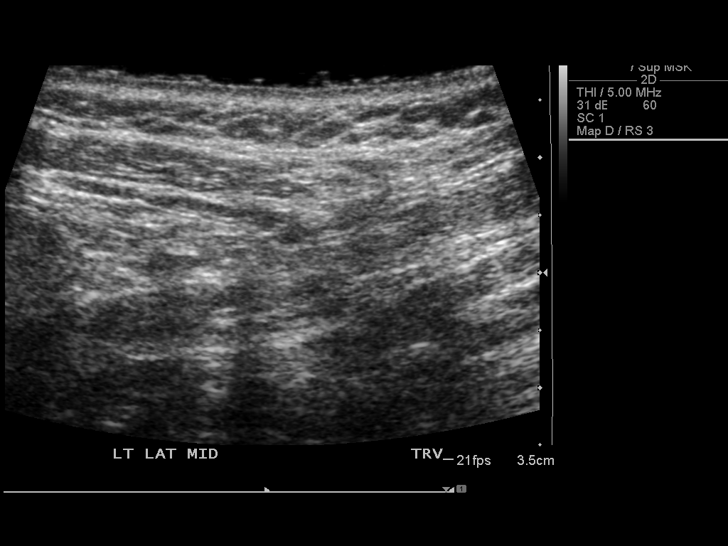
[im 5/29]
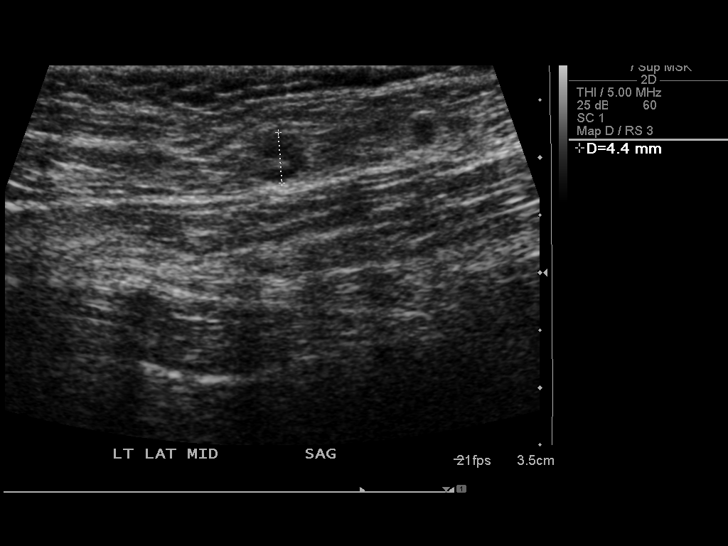
[im 8/29]
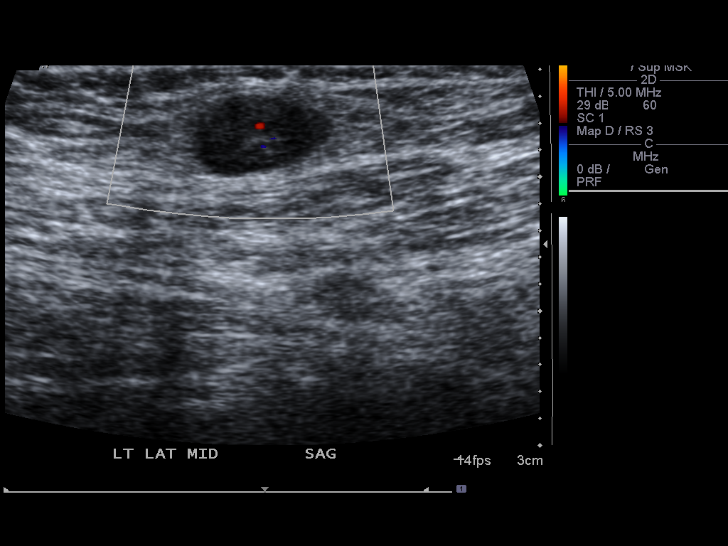
[im 10/29]
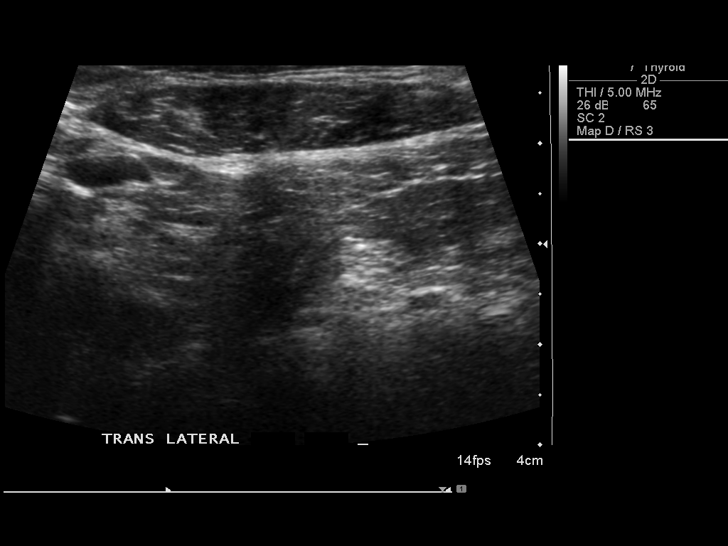
[im 11/29]
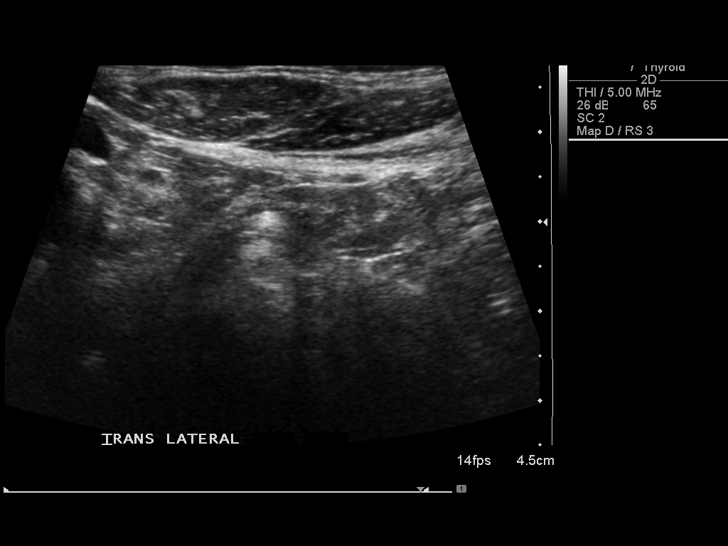
[im 13/29]
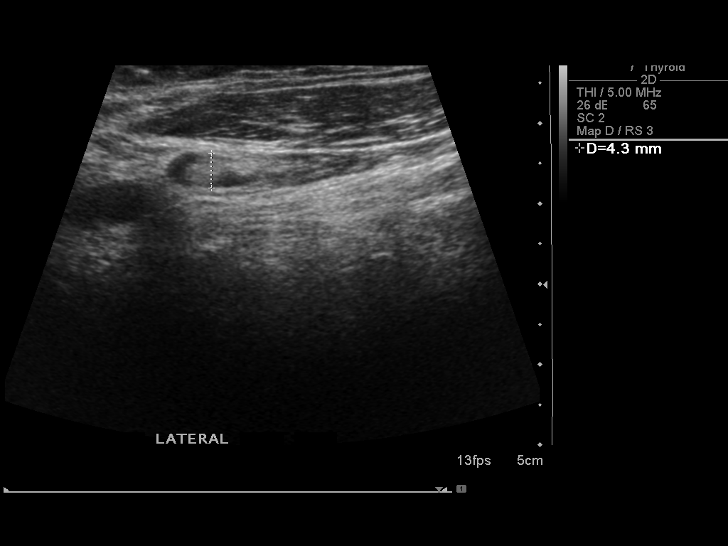
[im 16/29]
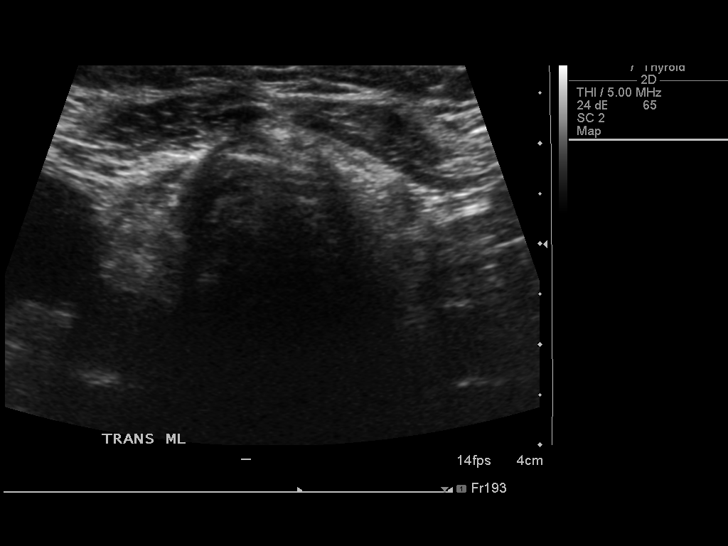
[im 18/29]
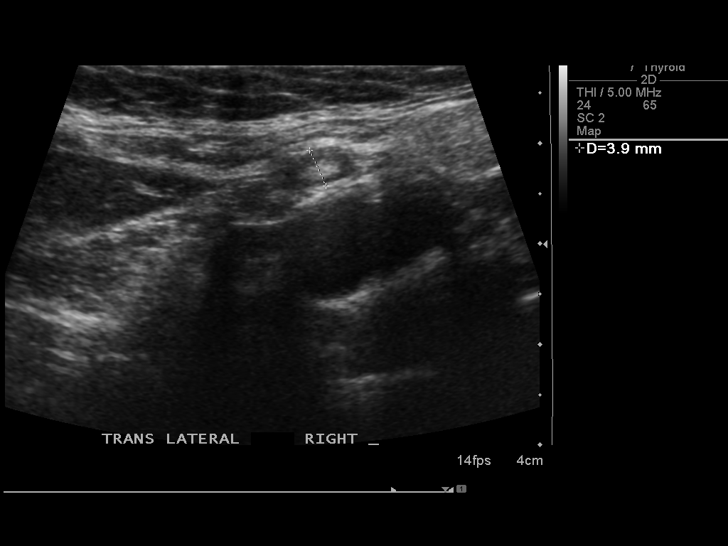
[im 19/29]
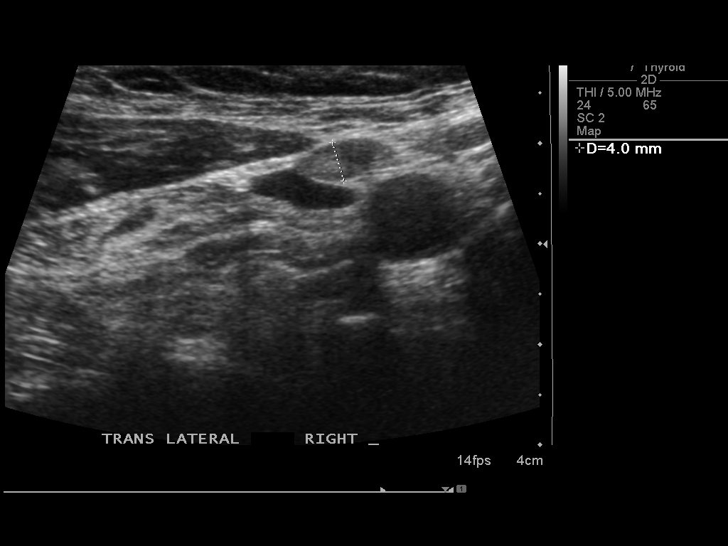
[im 22/29]
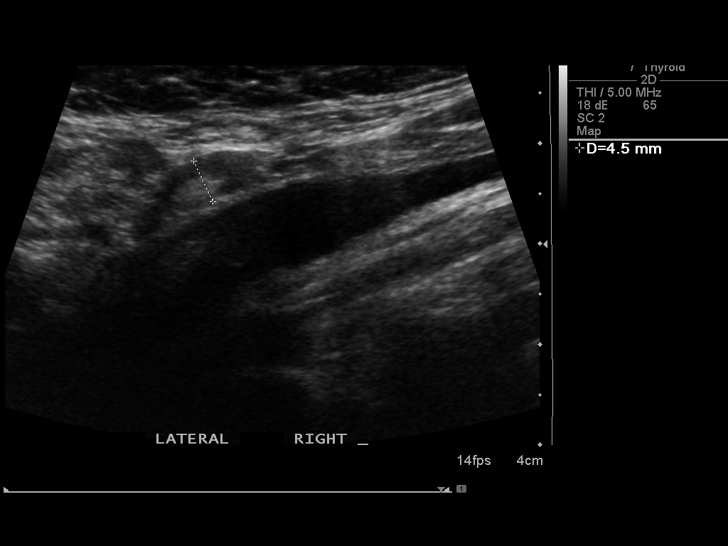
[im 24/29]
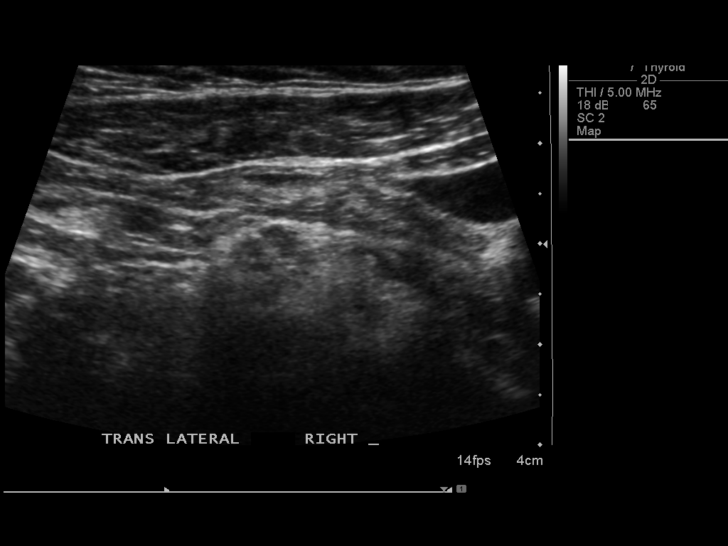
[im 26/29]
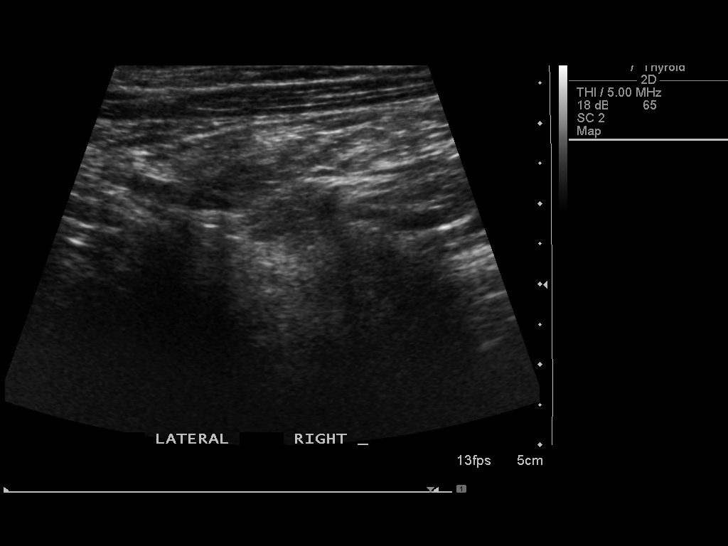
[im 29/29]
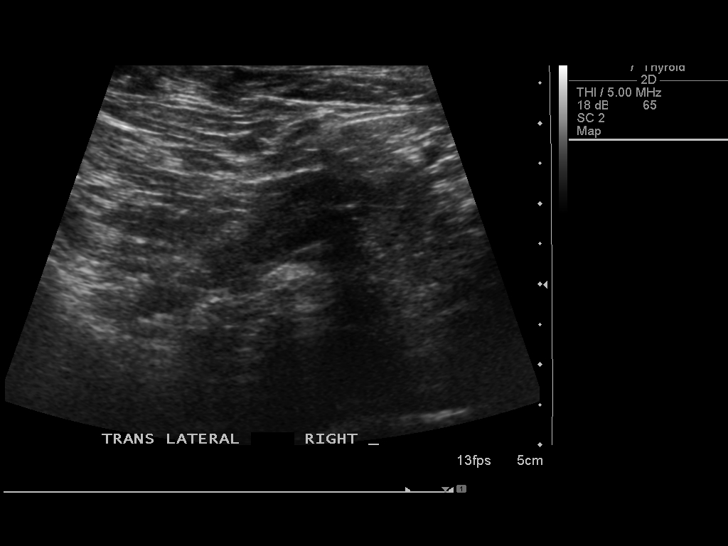

[14 of 25 positions shown; findings below may reference images not displayed]

FINDINGS: No mass or fluid collection or cystic abnormality is noted in the
area of palpable abnormality in the left neck. Multiple lymph nodes
are noted bilaterally, with the largest measuring 6 mm on the left
and 5 mm on the right.
IMPRESSION: Bilateral lymph nodes are noted, all of which are less than 1 cm in
size and therefore benign or reactive in etiology. No definite
abnormality seen in area of palpable concern in left neck.

## 2016-09-03 ENCOUNTER — Other Ambulatory Visit: Payer: Self-pay

## 2016-09-03 ENCOUNTER — Ambulatory Visit (HOSPITAL_COMMUNITY): Payer: Medicare HMO | Admitting: Anesthesiology

## 2016-09-03 ENCOUNTER — Encounter (HOSPITAL_COMMUNITY)
Admission: RE | Disposition: A | Discharge status: Home / Self Care | Payer: Self-pay | Source: Ambulatory Visit | Attending: Gastroenterology

## 2016-09-03 ENCOUNTER — Ambulatory Visit (HOSPITAL_COMMUNITY)
Admission: RE | Admit: 2016-09-03 | Discharge: 2016-09-03 | Disposition: A | Discharge status: Home / Self Care | Payer: Medicare HMO | Source: Ambulatory Visit | Attending: Gastroenterology | Admitting: Gastroenterology

## 2016-09-03 ENCOUNTER — Encounter (HOSPITAL_COMMUNITY): Payer: Self-pay | Admitting: *Deleted

## 2016-09-03 ENCOUNTER — Telehealth: Payer: Self-pay | Admitting: Gastroenterology

## 2016-09-03 DIAGNOSIS — G4730 Sleep apnea, unspecified: Secondary | ICD-10-CM | POA: Diagnosis not present

## 2016-09-03 DIAGNOSIS — G894 Chronic pain syndrome: Secondary | ICD-10-CM | POA: Insufficient documentation

## 2016-09-03 DIAGNOSIS — J449 Chronic obstructive pulmonary disease, unspecified: Secondary | ICD-10-CM | POA: Diagnosis not present

## 2016-09-03 DIAGNOSIS — Z9114 Patient's other noncompliance with medication regimen: Secondary | ICD-10-CM | POA: Insufficient documentation

## 2016-09-03 DIAGNOSIS — R110 Nausea: Secondary | ICD-10-CM

## 2016-09-03 DIAGNOSIS — Z791 Long term (current) use of non-steroidal anti-inflammatories (NSAID): Secondary | ICD-10-CM | POA: Diagnosis not present

## 2016-09-03 DIAGNOSIS — E039 Hypothyroidism, unspecified: Secondary | ICD-10-CM | POA: Insufficient documentation

## 2016-09-03 DIAGNOSIS — I10 Essential (primary) hypertension: Secondary | ICD-10-CM | POA: Diagnosis not present

## 2016-09-03 DIAGNOSIS — Z87891 Personal history of nicotine dependence: Secondary | ICD-10-CM | POA: Insufficient documentation

## 2016-09-03 DIAGNOSIS — Z794 Long term (current) use of insulin: Secondary | ICD-10-CM | POA: Insufficient documentation

## 2016-09-03 DIAGNOSIS — F329 Major depressive disorder, single episode, unspecified: Secondary | ICD-10-CM | POA: Diagnosis not present

## 2016-09-03 DIAGNOSIS — K3184 Gastroparesis: Secondary | ICD-10-CM

## 2016-09-03 DIAGNOSIS — Z79899 Other long term (current) drug therapy: Secondary | ICD-10-CM | POA: Insufficient documentation

## 2016-09-03 DIAGNOSIS — E1165 Type 2 diabetes mellitus with hyperglycemia: Secondary | ICD-10-CM | POA: Insufficient documentation

## 2016-09-03 DIAGNOSIS — Z9884 Bariatric surgery status: Secondary | ICD-10-CM | POA: Insufficient documentation

## 2016-09-03 DIAGNOSIS — Z6841 Body Mass Index (BMI) 40.0 and over, adult: Secondary | ICD-10-CM | POA: Insufficient documentation

## 2016-09-03 DIAGNOSIS — E1143 Type 2 diabetes mellitus with diabetic autonomic (poly)neuropathy: Secondary | ICD-10-CM | POA: Insufficient documentation

## 2016-09-03 DIAGNOSIS — Z79891 Long term (current) use of opiate analgesic: Secondary | ICD-10-CM | POA: Insufficient documentation

## 2016-09-03 DIAGNOSIS — Z9111 Patient's noncompliance with dietary regimen: Secondary | ICD-10-CM | POA: Diagnosis not present

## 2016-09-03 DIAGNOSIS — K7460 Unspecified cirrhosis of liver: Secondary | ICD-10-CM

## 2016-09-03 DIAGNOSIS — E785 Hyperlipidemia, unspecified: Secondary | ICD-10-CM | POA: Diagnosis not present

## 2016-09-03 DIAGNOSIS — R69 Illness, unspecified: Secondary | ICD-10-CM | POA: Diagnosis not present

## 2016-09-03 HISTORY — PX: ESOPHAGOGASTRODUODENOSCOPY (EGD) WITH PROPOFOL: SHX5813

## 2016-09-03 LAB — GLUCOSE, CAPILLARY: GLUCOSE-CAPILLARY: 299 mg/dL — AB (ref 65–99)

## 2016-09-03 SURGERY — ESOPHAGOGASTRODUODENOSCOPY (EGD) WITH PROPOFOL
Anesthesia: Monitor Anesthesia Care

## 2016-09-03 MED ORDER — LIDOCAINE 2% (20 MG/ML) 5 ML SYRINGE
INTRAMUSCULAR | Status: AC
  Filled 2016-09-03: qty 5

## 2016-09-03 MED ORDER — PROPOFOL 10 MG/ML IV BOLUS
INTRAVENOUS | Status: DC | PRN
  Administered 2016-09-03 (×2): 50 mg via INTRAVENOUS

## 2016-09-03 MED ORDER — LIDOCAINE 2% (20 MG/ML) 5 ML SYRINGE
INTRAMUSCULAR | Status: DC | PRN
  Administered 2016-09-03: 100 mg via INTRAVENOUS

## 2016-09-03 MED ORDER — SODIUM CHLORIDE 0.9 % IV SOLN: INTRAVENOUS | Status: DC

## 2016-09-03 MED ORDER — PROPOFOL 10 MG/ML IV BOLUS
INTRAVENOUS | Status: AC
  Filled 2016-09-03: qty 40

## 2016-09-03 MED ORDER — METOCLOPRAMIDE HCL 5 MG/ML IJ SOLN
INTRAMUSCULAR | Status: DC | PRN
  Administered 2016-09-03: 10 mg via INTRAVENOUS

## 2016-09-03 MED ORDER — METOCLOPRAMIDE HCL 5 MG/ML IJ SOLN
INTRAMUSCULAR | Status: AC
  Filled 2016-09-03: qty 2

## 2016-09-03 MED ORDER — PROPOFOL 500 MG/50ML IV EMUL
INTRAVENOUS | Status: DC | PRN
  Administered 2016-09-03: 100 ug/kg/min via INTRAVENOUS

## 2016-09-03 MED ORDER — METOCLOPRAMIDE HCL 5 MG PO TABS: 5.0000 mg | ORAL_TABLET | Freq: Three times a day (TID) | ORAL | 2 refills | Status: DC

## 2016-09-03 MED ORDER — LACTATED RINGERS IV SOLN
INTRAVENOUS | Status: DC | PRN
  Administered 2016-09-03: 10:00:00 via INTRAVENOUS

## 2016-09-03 SURGICAL SUPPLY — 15 items

## 2016-09-03 NOTE — Anesthesia Postprocedure Evaluation (Addendum)
Anesthesia Post Note  Patient: Mary Acosta  Procedure(s) Performed: Procedure(s) (LRB): ESOPHAGOGASTRODUODENOSCOPY (EGD) WITH PROPOFOL (N/A)  Patient location during evaluation: PACU Anesthesia Type: MAC Level of consciousness: awake and alert Pain management: pain level controlled Vital Signs Assessment: post-procedure vital signs reviewed and stable Respiratory status: spontaneous breathing, nonlabored ventilation, respiratory function stable and patient connected to nasal cannula oxygen Cardiovascular status: stable and blood pressure returned to baseline Anesthetic complications: no       Last Vitals:  Vitals:   09/03/16 0945 09/03/16 1030  BP: (!) 167/84 122/70  Pulse: 87 73  Resp: 15 (!) 24  Temp: 37.1 C     Last Pain:  Vitals:   09/03/16 1030  TempSrc: Oral                 [name]

## 2016-09-03 NOTE — Telephone Encounter (Signed)
Mary Acosta has gastroparesis based on today's EGD findings.  In post-op, with her husband present, we discussed starting metoclopramide, discussing both its expected effects and possible side effects including, but not limited to, tardive dyskinesia.  I described that and demonstrated what it might look like, and instructed her to stop med and le me know if such things occurred.  I feel the benefits of controlling nausea and improving glucose control outweigh the risks, and we will try it for at least the next 3 months. She was instructed to stop the promethazine (phenergan), but please remind her of that.  She apparently has a new pharmacy in [place].  [facility] [address]  Please confirm that with her, then load that in as her new default pharmacy if that is what she wants.  Then send prescription for metoclopramide 5 mg, one tablet three times daily before meals.  Disp #90, RF 2  And I would like to see her in the clinic in about 3 months

## 2016-09-03 NOTE — H&P (View-Only) (Signed)
[facility] Progress Note  Chief Complaint: Cirrhosis  Subjective  History:  This is a 61 year old woman with multiple medical problems as outlined in my initial office note from February 2017. She has cirrhosis, most likely on the basis of NAFLD, as well as a chronic pain syndrome and poorly controlled diabetes with a recent hemoglobin A1c of 12.4. She had recurrent C. difficile colitis starting last spring and into the late summer, finally cleared with a tapering dose of vancomycin given by infectious disease. She had a negative stool toxin at the end of therapy. She admits to poor compliance with her diet, and says that when she is anxious she will eat "whenever I want". She has no episodes of confusion according to her husband, no gait unsteadiness, and has no history of GI bleeding. ROS: Cardiovascular:  no chest pain Respiratory: no dyspnea She has chronic back and joint pain. She also reports frequent nausea, which improves with Phenergan. The patient's Past Medical, Family and Social History were reviewed and are on file in the EMR.  Objective:  Med list reviewed  Vital signs in last 24 hrs: Vitals:   08/12/16 1313  BP: 122/78  Pulse: (!) 108    Physical Exam  Chronically ill-appearing woman who looks older than stated age  68: sclera anicteric, oral mucosa moist without lesions  Neck: supple, no thyromegaly, JVD or lymphadenopathy  Cardiac: RRR without murmurs, S1S2 heard, no peripheral edema  Pulm: Globally decreased breath sounds bilaterally, normal RR and effort noted  Abdomen: Morbidly obese, soft, mild epigastric tenderness, with active bowel sounds. No guarding or palpable hepatosplenomegaly. Lap band port is felt in the upper abdominal wall.  Skin; warm and dry, no jaundice or rash  Recent Labs:  Hgb A1c 12.4 on 05/16/16  Radiologic studies:  Recent chest CT so's changes of cirrhosis with possible infiltrate in the left lung. She has cirrhotic  changes of the liver and probable pulmonary hypertension.  @ASSESSMENTPLANBEGIN @ Assessment: Encounter Diagnoses  Name Primary?  . Cirrhosis of liver without ascites, unspecified hepatic cirrhosis type ([place]) Yes  . Recurrent Clostridium difficile diarrhea   . Chronic obstructive pulmonary disease, unspecified COPD type ([place])   . Nausea without vomiting      She unquestionably needs improved compliance with diabetic regimen.  C. difficile appears to have cleared.   She is due for hepatoma and variceal screening. It does not seem she is likely to get better compliance and therefore controlled diabetes. Her procedure must be done at the hospital due to comorbidities.  Plan: Upper endoscopy  The benefits and risks of the planned procedure were described in detail with the patient or (when appropriate) their health care proxy.  Risks were outlined as including, but not limited to, bleeding, infection, perforation, adverse medication reaction leading to cardiac or pulmonary decompensation, or pancreatitis (if ERCP).  The limitation of incomplete mucosal visualization was also discussed.  No guarantees or warranties were given.  AFP, CBC, CMP, INR Right upper quadrant ultrasound Phenergan was prescribed   Total time 30  minutes, over half spent in counseling and coordination of care.   [name]

## 2016-09-03 NOTE — Discharge Instructions (Signed)
YOU HAD AN ENDOSCOPIC PROCEDURE TODAY: Refer to the procedure report and other information in the discharge instructions given to you for any specific questions about what was found during the examination. If this information does not answer your questions, please call [place] office at [phone] to clarify.  ° °YOU SHOULD EXPECT: Some feelings of bloating in the abdomen. Passage of more gas than usual. Walking can help get rid of the air that was put into your GI tract during the procedure and reduce the bloating. If you had a lower endoscopy (such as a colonoscopy or flexible sigmoidoscopy) you may notice spotting of blood in your stool or on the toilet paper. Some abdominal soreness may be present for a day or two, also. ° °DIET: Your first meal following the procedure should be a light meal and then it is ok to progress to your normal diet. A half-sandwich or bowl of soup is an example of a good first meal. Heavy or fried foods are harder to digest and may make you feel nauseous or bloated. Drink plenty of fluids but you should avoid alcoholic beverages for 24 hours. If you had a esophageal dilation, please see attached instructions for diet.   ° °ACTIVITY: Your care partner should take you home directly after the procedure. You should plan to take it easy, moving slowly for the rest of the day. You can resume normal activity the day after the procedure however YOU SHOULD NOT DRIVE, use power tools, machinery or perform tasks that involve climbing or major physical exertion for 24 hours (because of the sedation medicines used during the test).  ° °SYMPTOMS TO REPORT IMMEDIATELY: °A gastroenterologist can be reached at any hour. Please call [phone]  for any of the following symptoms:  °Following lower endoscopy (colonoscopy, flexible sigmoidoscopy) °Excessive amounts of blood in the stool  °Significant tenderness, worsening of abdominal pains  °Swelling of the abdomen that is new, acute  °Fever of 100° or  higher  °Following upper endoscopy (EGD, EUS, ERCP, esophageal dilation) °Vomiting of blood or coffee ground material  °New, significant abdominal pain  °New, significant chest pain or pain under the shoulder blades  °Painful or persistently difficult swallowing  °New shortness of breath  °Black, tarry-looking or red, bloody stools ° °FOLLOW UP:  °If any biopsies were taken you will be contacted by phone or by letter within the next 1-3 weeks. Call [phone]  if you have not heard about the biopsies in 3 weeks.  °Please also call with any specific questions about appointments or follow up tests. ° °

## 2016-09-03 NOTE — Anesthesia Preprocedure Evaluation (Signed)
Anesthesia Evaluation  Patient identified by MRN, date of birth, ID band Patient awake    Reviewed: Allergy & Precautions, H&P , NPO status , Patient's Chart, lab work & pertinent test results  Airway Mallampati: II   Neck ROM: full    Dental   Pulmonary asthma , sleep apnea , COPD, former smoker,    breath sounds clear to auscultation       Cardiovascular hypertension,  Rhythm:regular Rate:Normal     Neuro/Psych  Headaches, PSYCHIATRIC DISORDERS Anxiety Depression  Neuromuscular disease    GI/Hepatic   Endo/Other  diabetes, Type 2Hypothyroidism Morbid obesity  Renal/GU      Musculoskeletal  (+) Arthritis , Fibromyalgia -  Abdominal   Peds  Hematology   Anesthesia Other Findings   Reproductive/Obstetrics                             Anesthesia Physical Anesthesia Plan  ASA: III  Anesthesia Plan: MAC   Post-op Pain Management:    Induction: Intravenous  Airway Management Planned: Nasal Cannula  Additional Equipment:   Intra-op Plan:   Post-operative Plan:   Informed Consent: I have reviewed the patients History and Physical, chart, labs and discussed the procedure including the risks, benefits and alternatives for the proposed anesthesia with the patient or authorized representative who has indicated his/her understanding and acceptance.     Plan Discussed with: CRNA, Anesthesiologist and Surgeon  Anesthesia Plan Comments:         Anesthesia Quick Evaluation

## 2016-09-03 NOTE — Telephone Encounter (Signed)
Spoke to patient about new medication that is ordered and again went over possible side effects. Also cautioned her that Dr. Loletha Carrow wants her to stop taking the phenergan. [facility] [address] [facility]. Recall in place to have patient called for 3 month follow up appointment. Patient advised that if she would like to call office in February to see if the schedule is available for April, she is more welcome to do that in advance.

## 2016-09-03 NOTE — Op Note (Signed)
[facility] Patient Name: Mary Acosta Procedure Date: 09/03/2016 MRN: [id number] Attending MD: Estill Cotta. Loletha Carrow , MD Date of Birth: 05-05-1956 CSN: [id number] Age: 61 Admit Type: Outpatient Procedure:                Upper GI endoscopy Indications:              Cirrhosis rule out esophageal varices, Nausea Providers:                Mallie Mussel L. Loletha Carrow, MD, Hilma Favors, RN, Cletis [place],                            Technician Referring MD:             Binnie Rail, MD Medicines:                Monitored Anesthesia Care Complications:            No immediate complications. Estimated Blood Loss:     Estimated blood loss: none. Procedure:                Pre-Anesthesia Assessment:                           - Prior to the procedure, a History and Physical                            was performed, and patient medications and                            allergies were reviewed. The patient's tolerance of                            previous anesthesia was also reviewed. The risks                            and benefits of the procedure and the sedation                            options and risks were discussed with the patient.                            All questions were answered, and informed consent                            was obtained. Prior Anticoagulants: The patient has                            taken no previous anticoagulant or antiplatelet                            agents. ASA Grade Assessment: III - A patient with                            severe systemic disease. After reviewing the risks  and benefits, the patient was deemed in                            satisfactory condition to undergo the procedure.                           After obtaining informed consent, the endoscope was                            passed under direct vision. Throughout the                            procedure, the patient's blood pressure, pulse, and                         oxygen saturations were monitored continuously. The                            EG-[id number] ([id number]) scope was introduced through the                            mouth, and advanced to the duodenal bulb. The upper                            GI endoscopy was accomplished without difficulty.                            The patient tolerated the procedure well. Scope In: Scope Out: Findings:      The esophagus was normal.      A large amount of food (residue) was found in the entire examined       stomach. This significantly reduced visualization.      The duodenal bulb was normal. The scope could not be advanced to the       more distal duodenum due to retained food. Impression:               - Normal esophagus.                           - A large amount of food (residue) in the stomach.                           - Normal duodenal bulb.                           - No specimens collected.                           The gastroparesis is largely due to                            poorly-controlled diabetes, which is itself largely                            due to patient dietary and medication  noncompliance. Chronic opioid medicine is                            contributing as well. Moderate Sedation:      MAC sedation used Recommendation:           - Patient has a contact number available for                            emergencies. The signs and symptoms of potential                            delayed complications were discussed with the                            patient. Return to normal activities tomorrow.                            Written discharge instructions were provided to the                            patient.                           - Diabetic (ADA) diet and gastroparesis diet.                           - Continue present medications except promethazine                            (phenergan).                           - Repeat  upper endoscopy in 3 years for                            surveillance.                           - Use Reglan (metoclopramide) 5 mg PO TID for 3                            months.                           - Return to my office in 3 months. Procedure Code(s):        --- Professional ---                           [phone], Esophagogastroduodenoscopy, flexible,                            transoral; diagnostic, including collection of                            specimen(s) by brushing or washing, when performed                            (  separate procedure) Diagnosis Code(s):        --- Professional ---                           K74.60, Unspecified cirrhosis of liver                           R11.0, Nausea CPT copyright 2016 [employer]. All rights reserved. The codes documented in this report are preliminary and upon coder review may  be revised to meet current compliance requirements. Henry L. Loletha Carrow, MD 09/03/2016 10:36:40 AM This report has been signed electronically. Number of Addenda: 0

## 2016-09-03 NOTE — Interval H&P Note (Signed)
History and Physical Interval Note:  09/03/2016 10:02 AM  Mary Acosta  has presented today for surgery, with the diagnosis of Cirrhosis   The various methods of treatment have been discussed with the patient and family. After consideration of risks, benefits and other options for treatment, the patient has consented to  Procedure(s): ESOPHAGOGASTRODUODENOSCOPY (EGD) WITH PROPOFOL (N/A) as a surgical intervention .  The patient's history has been reviewed, patient examined, no change in status, stable for surgery.  I have reviewed the patient's chart and labs.  Questions were answered to the patient's satisfaction.     [name]

## 2016-09-03 NOTE — Transfer of Care (Signed)
Immediate Anesthesia Transfer of Care Note  Patient: Mary Acosta  Procedure(s) Performed: Procedure(s): ESOPHAGOGASTRODUODENOSCOPY (EGD) WITH PROPOFOL (N/A)  Patient Location: PACU  Anesthesia Type:MAC  Level of Consciousness: Patient easily awoken, sedated, comfortable, cooperative, following commands, responds to stimulation.   Airway & Oxygen Therapy: Patient spontaneously breathing, ventilating well, oxygen via simple oxygen mask.  Post-op Assessment: Report given to PACU RN, vital signs reviewed and stable, moving all extremities.   Post vital signs: Reviewed and stable.  Complications: No apparent anesthesia complications Last Vitals:  Vitals:   09/03/16 0945  BP: (!) 167/84  Pulse: 87  Resp: 15  Temp: 37.1 C    Last Pain:  Vitals:   09/03/16 0945  TempSrc: Oral         Complications: No apparent anesthesia complications

## 2016-09-05 ENCOUNTER — Encounter: Payer: Self-pay | Admitting: Cardiology

## 2016-09-05 ENCOUNTER — Ambulatory Visit (INDEPENDENT_AMBULATORY_CARE_PROVIDER_SITE_OTHER): Payer: Medicare HMO | Admitting: Cardiology

## 2016-09-05 VITALS — BP 156/82 | HR 89 | Ht 68.0 in | Wt 286.0 lb

## 2016-09-05 DIAGNOSIS — I272 Pulmonary hypertension, unspecified: Secondary | ICD-10-CM

## 2016-09-05 DIAGNOSIS — I10 Essential (primary) hypertension: Secondary | ICD-10-CM

## 2016-09-05 DIAGNOSIS — I2510 Atherosclerotic heart disease of native coronary artery without angina pectoris: Secondary | ICD-10-CM | POA: Diagnosis not present

## 2016-09-05 DIAGNOSIS — E7800 Pure hypercholesterolemia, unspecified: Secondary | ICD-10-CM | POA: Diagnosis not present

## 2016-09-05 MED ORDER — VALSARTAN 160 MG PO TABS: 160.0000 mg | ORAL_TABLET | Freq: Every day | ORAL | 3 refills | Status: DC

## 2016-09-05 MED ORDER — ATORVASTATIN CALCIUM 40 MG PO TABS: 40.0000 mg | ORAL_TABLET | Freq: Every day | ORAL | 3 refills | Status: DC

## 2016-09-05 NOTE — Patient Instructions (Signed)
Medication Instructions:   START ASPIRIN 81 MG ONCE DAILY  START ATORVASTATIN 40 MG ONCE DAILY  INCREASE VALSARTAN TO 160 MG ONCE DAILY= 2 OF THE 80 MG TABLETS ONCE DAILY  Labwork:  Your physician recommends that you return for lab work in: 4 WEEKS= DO NOT EAT PRIOR TO LAB WORK  Testing/Procedures:  Your physician has requested that you have an echocardiogram. Echocardiography is a painless test that uses sound waves to create images of your heart. It provides your doctor with information about the size and shape of your heart and how well your heart's chambers and valves are working. This procedure takes approximately one hour. There are no restrictions for this procedure.    Follow-Up:  Your physician wants you to follow-up in: 3 [place] will receive a reminder letter in the mail two months in advance. If you don't receive a letter, please call our office to schedule the follow-up appointment.

## 2016-09-06 ENCOUNTER — Encounter (HOSPITAL_COMMUNITY): Payer: Self-pay | Admitting: Gastroenterology

## 2016-09-11 DIAGNOSIS — J449 Chronic obstructive pulmonary disease, unspecified: Secondary | ICD-10-CM | POA: Diagnosis not present

## 2016-09-21 ENCOUNTER — Encounter: Payer: Self-pay | Admitting: Gastroenterology

## 2016-09-21 DIAGNOSIS — E119 Type 2 diabetes mellitus without complications: Secondary | ICD-10-CM | POA: Diagnosis not present

## 2016-09-23 DIAGNOSIS — J449 Chronic obstructive pulmonary disease, unspecified: Secondary | ICD-10-CM | POA: Diagnosis not present

## 2016-09-24 ENCOUNTER — Other Ambulatory Visit: Payer: Self-pay

## 2016-09-24 ENCOUNTER — Ambulatory Visit (HOSPITAL_COMMUNITY): Payer: Medicare HMO | Attending: Cardiovascular Disease

## 2016-09-24 DIAGNOSIS — I272 Pulmonary hypertension, unspecified: Secondary | ICD-10-CM | POA: Diagnosis not present

## 2016-09-24 DIAGNOSIS — I501 Left ventricular failure: Secondary | ICD-10-CM | POA: Diagnosis not present

## 2016-09-26 ENCOUNTER — Encounter: Payer: Self-pay | Admitting: Internal Medicine

## 2016-09-26 ENCOUNTER — Ambulatory Visit (INDEPENDENT_AMBULATORY_CARE_PROVIDER_SITE_OTHER): Payer: Medicare HMO | Admitting: Internal Medicine

## 2016-09-26 VITALS — BP 120/79 | HR 91 | Temp 98.0°F | Ht 68.0 in | Wt 287.0 lb

## 2016-09-26 DIAGNOSIS — A0471 Enterocolitis due to Clostridium difficile, recurrent: Secondary | ICD-10-CM | POA: Diagnosis not present

## 2016-09-26 NOTE — Progress Notes (Signed)
RFV: recurrent cdiff  Patient ID: Mary Acosta, female   DOB: 11-02-1955, 60 y.o.   MRN: [id number]  HPI  Mary Acosta is a 61 yo F who has multiple medical problems but has had lap-band to address obesity, and complicated history with recurrent cdifficile though no relapse since Fall of 2017 after being on a taper. She has been recently diagnosed with gatroparesis by dr Loletha Carrow in January and has just started taking metoclopramide. She denies any recent diarrhea.  Outpatient Encounter Prescriptions as of 09/26/2016  Medication Sig  . albuterol (PROVENTIL HFA;VENTOLIN HFA) 108 (90 BASE) MCG/ACT inhaler Inhale 2 puffs into the lungs every 6 (six) hours as needed for wheezing or shortness of breath.  Marland Kitchen amitriptyline (ELAVIL) 50 MG tablet Take 100 mg by mouth daily.   Marland Kitchen atorvastatin (LIPITOR) 40 MG tablet Take 1 tablet (40 mg total) by mouth daily.  Marland Kitchen desvenlafaxine (PRISTIQ) 50 MG 24 hr tablet   . HYDROmorphone (DILAUDID) 4 MG tablet Take 4 mg by mouth every 6 (six) hours as needed for moderate pain.   . hydrOXYzine (ATARAX/VISTARIL) 25 MG tablet Take 25 mg by mouth 2 (two) times daily.   . insulin NPH Human (NOVOLIN N) 100 UNIT/ML injection Inject 1.2 mLs (120 Units total) into the skin every morning.  . Insulin Syringe-Needle U-100 (INSULIN SYRINGE 1CC/30GX5/16") 30G X 5/16" 1 ML MISC Use as directed for insulin injections 5 times daily  . ipratropium-albuterol (DUONEB) 0.5-2.5 (3) MG/3ML SOLN Take 3 mLs by nebulization 4 (four) times daily. (Patient taking differently: Take 3 mLs by nebulization every 6 (six) hours as needed. )  . levothyroxine (SYNTHROID, LEVOTHROID) 150 MCG tablet Take 150 mcg by mouth daily before breakfast.  . meloxicam (MOBIC) 7.5 MG tablet Take 7.5 mg by mouth 2 (two) times daily.   . metoCLOPramide (REGLAN) 5 MG tablet Take 1 tablet (5 mg total) by mouth 3 (three) times daily before meals.  Marland Kitchen morphine (Mary CONTIN) 30 MG 12 hr tablet Take 30 mg by mouth 2 (two)  times daily.  . potassium chloride SA (K-DUR,KLOR-CON) 20 MEQ tablet Take 2 tablets by mouth daily as needed for cramping.  Marland Kitchen tiZANidine (ZANAFLEX) 2 MG tablet Take 2 mg by mouth 3 (three) times daily.  . valsartan (DIOVAN) 160 MG tablet Take 1 tablet (160 mg total) by mouth daily.   No facility-administered encounter medications on file as of 09/26/2016.      Patient Active Problem List   Diagnosis Date Noted  . Gastroparesis   . Memory difficulty 07/25/2016  . Acute bronchitis 04/30/2016  . Pain in toe 04/09/2016  . Lump in neck 01/29/2016  . Chronic diastolic (congestive) heart failure 01/03/2016  . Fatty liver 01/03/2016  . Type 1 diabetes mellitus ([place]) 01/03/2016  . Recurrent Clostridium difficile diarrhea 01/03/2016  . Cirrhosis of liver without ascites ([place]) 12/07/2015  . Chronic respiratory failure ([place]) 11/10/2014  . Physical deconditioning 09/26/2014  . Dyspnea 09/26/2014  . HCAP (healthcare-associated pneumonia) 08/26/2014  . COPD (chronic obstructive pulmonary disease) ([place]) 08/26/2014  . Iron deficiency anemia, unspecified  04/05/2011  . Bariatric surgery status 04/05/2011  . Left arm pain   . UNSPECIFIED VITAMIN D DEFICIENCY 10/22/2007  . LOW BACK PAIN, CHRONIC 10/22/2007  . INSOMNIA 10/22/2007  . Obesity 10/14/2007  . Chronic pain syndrome 10/14/2007  . CARPAL TUNNEL SYNDROME 10/14/2007  . PERIPHERAL NEUROPATHY 10/14/2007  . ALLERGIC RHINITIS CAUSE UNSPECIFIED 10/14/2007  . ARTHRITIS 10/14/2007  . Hypothyroid 10/14/2007  . HLD (  hyperlipidemia) 09/14/2007  . Anxiety state 08/25/2007  . Depression 08/25/2007  . Essential hypertension 08/25/2007  . ASTHMA 08/25/2007  . CONSTIPATION 08/25/2007  . POLYMYALGIA RHEUMATICA 08/25/2007  . LEG EDEMA, BILATERAL 08/25/2007     Health Maintenance Due  Topic Date Due  . Hepatitis C Screening  1955/10/13  . TETANUS/TDAP  06/16/1975  . ZOSTAVAX  06/15/2016     Review of Systems + weight gain, considering  tightening of lap band? Otherwise 10 point ros is negative Physical Exam   BP 120/79   Pulse 91   Temp 98 F (36.7 C) (Oral)   Ht 5\' 8"  (1.727 m)   Wt 287 lb (130.2 kg)   BMI 43.64 kg/m   Physical Exam  Constitutional:  oriented to person, place, and time. appears well-developed and well-nourished. No distress.  HENT: [place]/AT, PERRLA, no scleral icterus Mouth/Throat: Oropharynx is clear and moist. No oropharyngeal exudate.  Cardiovascular: Normal rate, regular rhythm and normal heart sounds. Exam reveals no gallop and no friction rub.  No murmur heard.  Pulmonary/Chest: Effort normal and breath sounds normal. No respiratory distress.  has no wheezes.  Neck = supple, no nuchal rigidity Abdominal: Soft. Bowel sounds are normal.  exhibits no distension. There is no tenderness.  Lymphadenopathy: no cervical adenopathy. No axillary adenopathy Skin: Skin is warm and dry. No rash noted. No erythema.  Psychiatric: a normal mood and affect.  behavior is normal.   Lab Results  Component Value Date   LABRPR Non Reactive 07/25/2016    CBC Lab Results  Component Value Date   WBC 8.4 08/12/2016   RBC 5.14 (H) 08/12/2016   HGB 12.8 08/12/2016   HCT 38.9 08/12/2016   PLT 160.0 08/12/2016   MCV 75.7 (L) 08/12/2016   MCH 24.3 (L) 01/05/2016   MCHC 33.0 08/12/2016   RDW 14.4 08/12/2016   LYMPHSABS 2.7 08/12/2016   MONOABS 0.5 08/12/2016   EOSABS 0.3 08/12/2016    BMET Lab Results  Component Value Date   NA 133 (L) 08/12/2016   K 4.2 08/12/2016   CL 95 (L) 08/12/2016   CO2 29 08/12/2016   GLUCOSE 332 (H) 08/12/2016   BUN 14 08/12/2016   CREATININE 1.13 08/12/2016   CALCIUM 10.4 08/12/2016   GFRNONAA 34 (L) 01/06/2016   GFRAA 39 (L) 01/06/2016      Assessment and Plan  Recurrent cdifficile = appears to be without recurrence for 3 months. Recommend to avoid any unnecessary abtx exposure. If she has recurrent 3-4 loose stools in 24hr, recommend to retest for  c.difficile  Gastroparesis = recommend to keep taking metoclopramide to see if improving her symptoms of fullness  NASH = patient has numerous concerns to NASH cirrhosis. Recommended that when she sees Dr. Loletha Carrow, he could also explain further work-up if needed.  Doing well .come back if needed

## 2016-10-12 DIAGNOSIS — J449 Chronic obstructive pulmonary disease, unspecified: Secondary | ICD-10-CM | POA: Diagnosis not present

## 2016-10-14 DIAGNOSIS — M5137 Other intervertebral disc degeneration, lumbosacral region: Secondary | ICD-10-CM | POA: Diagnosis not present

## 2016-10-14 DIAGNOSIS — R61 Generalized hyperhidrosis: Secondary | ICD-10-CM | POA: Diagnosis not present

## 2016-10-14 DIAGNOSIS — I499 Cardiac arrhythmia, unspecified: Secondary | ICD-10-CM | POA: Diagnosis not present

## 2016-10-14 DIAGNOSIS — R110 Nausea: Secondary | ICD-10-CM | POA: Diagnosis not present

## 2016-10-14 DIAGNOSIS — R631 Polydipsia: Secondary | ICD-10-CM | POA: Diagnosis not present

## 2016-10-14 DIAGNOSIS — M533 Sacrococcygeal disorders, not elsewhere classified: Secondary | ICD-10-CM | POA: Diagnosis not present

## 2016-10-14 DIAGNOSIS — M545 Low back pain: Secondary | ICD-10-CM | POA: Diagnosis not present

## 2016-10-14 DIAGNOSIS — R21 Rash and other nonspecific skin eruption: Secondary | ICD-10-CM | POA: Diagnosis not present

## 2016-10-14 DIAGNOSIS — G8929 Other chronic pain: Secondary | ICD-10-CM | POA: Diagnosis not present

## 2016-10-14 DIAGNOSIS — G894 Chronic pain syndrome: Secondary | ICD-10-CM | POA: Diagnosis not present

## 2016-10-14 DIAGNOSIS — Z5181 Encounter for therapeutic drug level monitoring: Secondary | ICD-10-CM | POA: Diagnosis not present

## 2016-10-14 DIAGNOSIS — Z0289 Encounter for other administrative examinations: Secondary | ICD-10-CM | POA: Insufficient documentation

## 2016-10-14 DIAGNOSIS — M353 Polymyalgia rheumatica: Secondary | ICD-10-CM | POA: Diagnosis not present

## 2016-10-16 DIAGNOSIS — J449 Chronic obstructive pulmonary disease, unspecified: Secondary | ICD-10-CM | POA: Diagnosis not present

## 2016-10-21 IMAGING — US US ABDOMEN COMPLETE W/ ELASTOGRAPHY
1 series · 12 of 25 positions shown · non-contrast
Comparison: None

CLINICAL DATA: Cirrhosis without ascites, unspecified cirrhotic
type, diabetes mellitus, hypertension, history of nonalcoholic fatty
liver disease, COPD, thyroid cancer, cervical cancer, former smoker



[Series 1: us abdomen complete w/ elastography · 0.25mm/px · 12 of 62 slices shown]
[im 3/62]
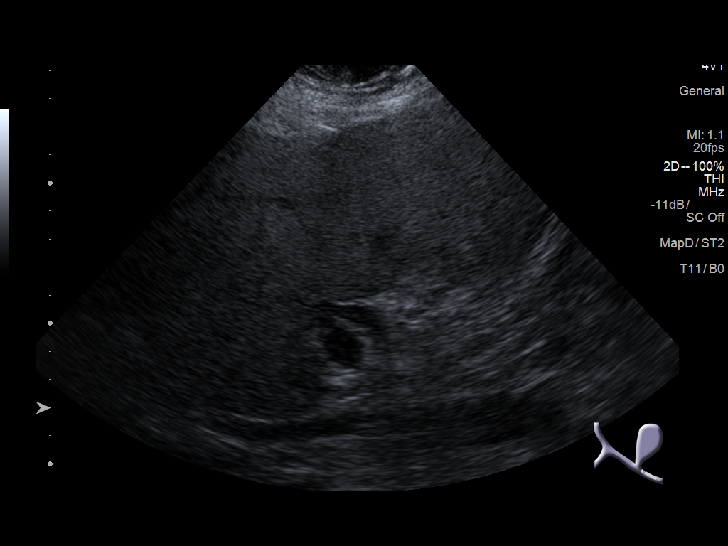
[im 8/62]
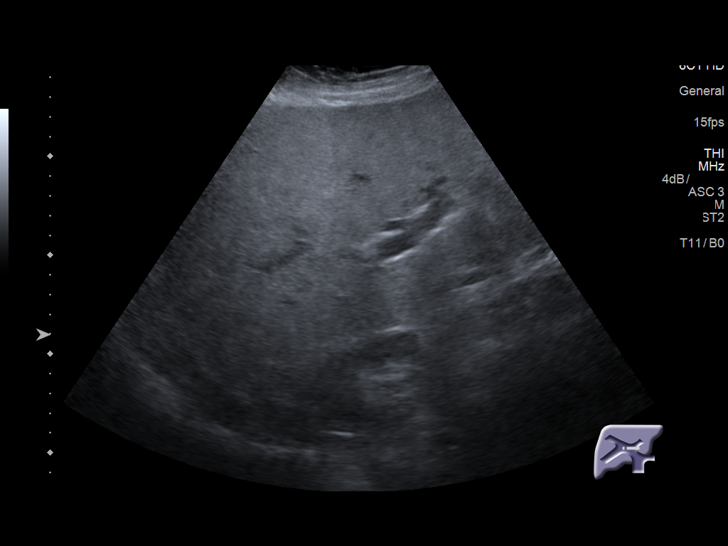
[im 13/62]
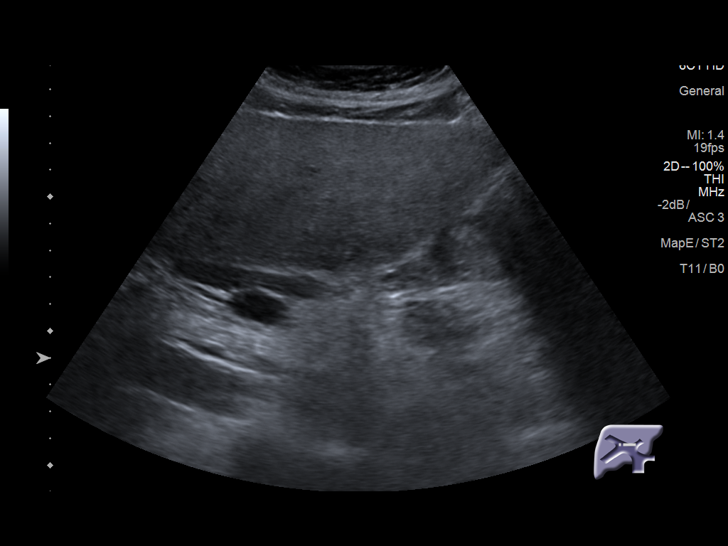
[im 18/62]
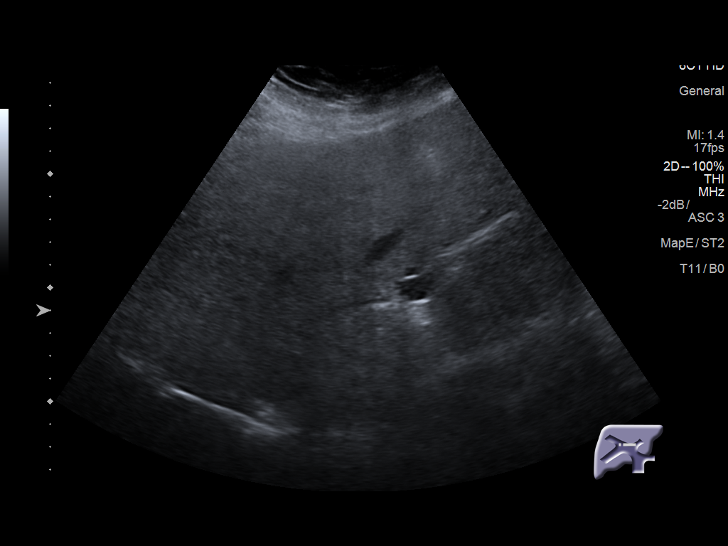
[im 23/62]
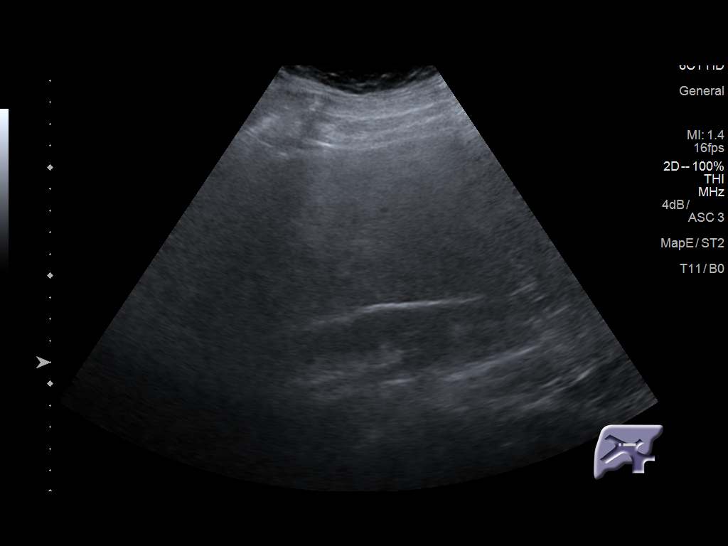
[im 28/62]
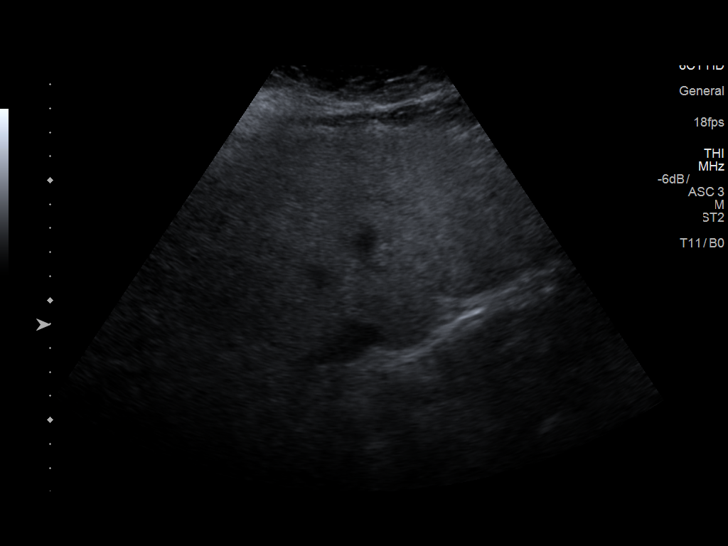
[im 34/62]
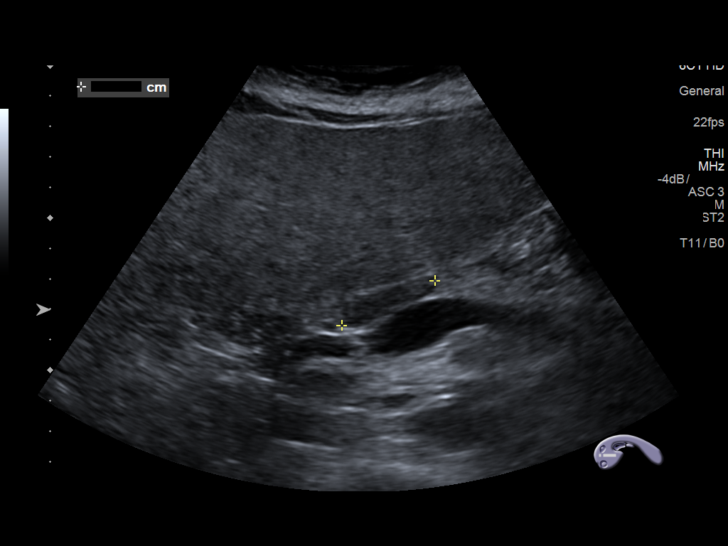
[im 39/62]
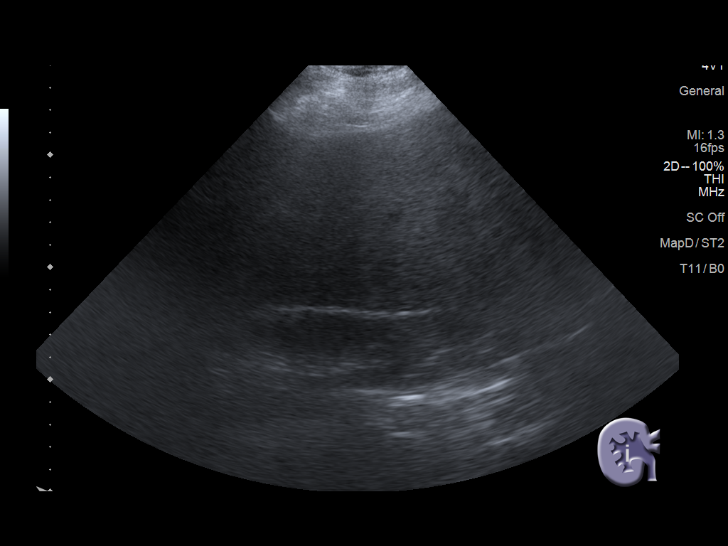
[im 44/62]
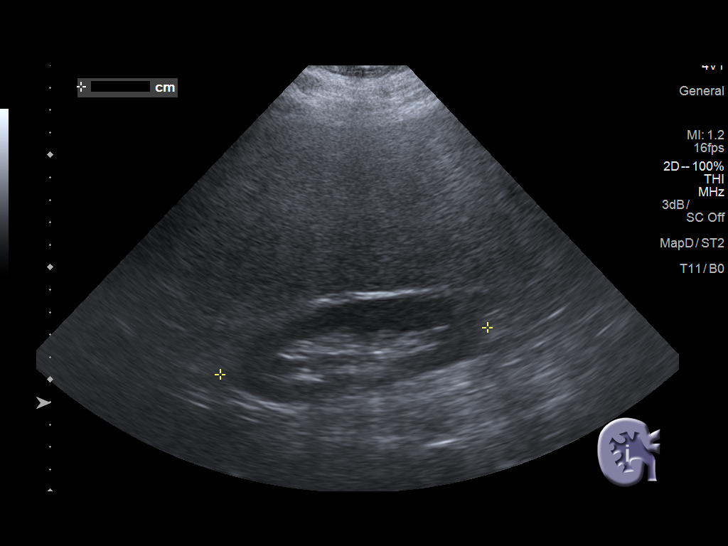
[im 49/62]
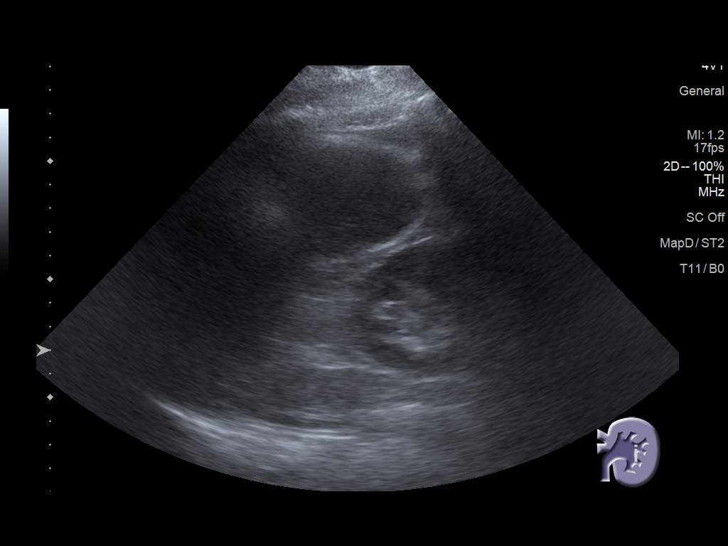
[im 54/62]
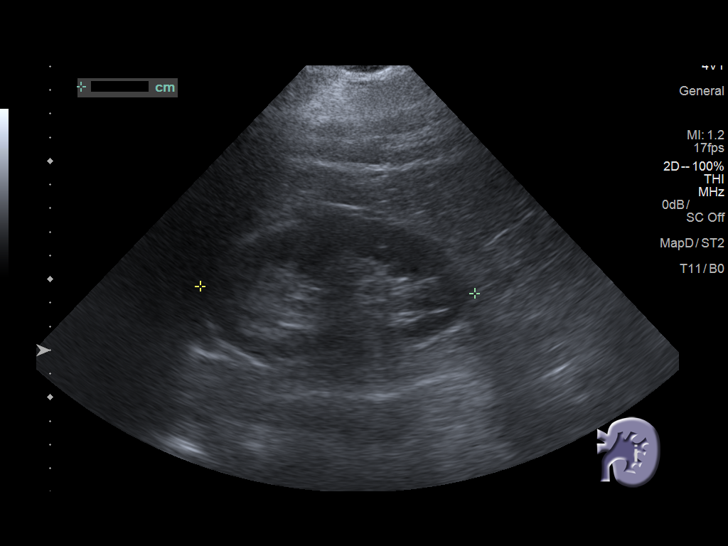
[im 59/62]
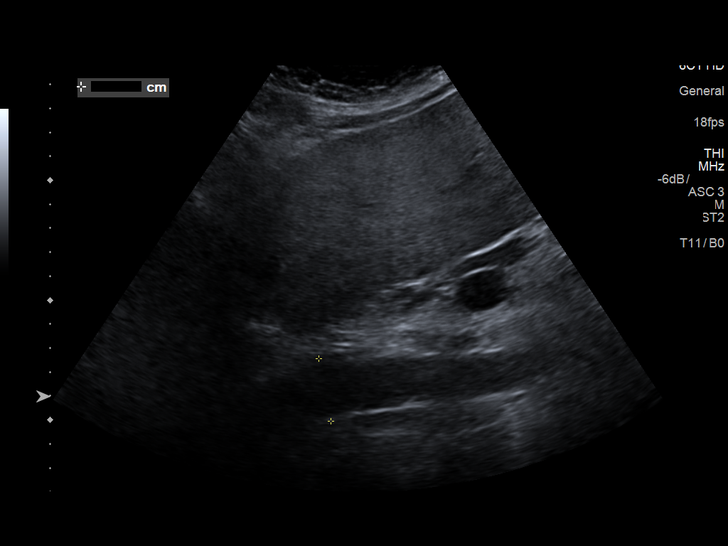

[12 of 25 positions shown; findings below may reference images not displayed]

FINDINGS: ULTRASOUND ABDOMEN

Gallbladder: Surgically absent

Common bile duct: Diameter: 8 mm diameter, which may be normal post
cholecystectomy

Liver: Increased echogenicity. Heterogeneous parenchyma. Minimally
nodular hepatic contour suggesting cirrhosis. No discrete hepatic
mass lesion identified though intrahepatic assessment is suboptimal
due to poor sound penetration. Hepatopetal portal venous flow.

IVC: Normal appearance

Pancreas: Normal appearance. Question small peripancreatic lymph
node, normal sized at 7 mm short axis.

Spleen: Minimally prominent size, 13.2 cm length with calculated
volume 451 mL. No discrete focal lesion.

Right Kidney: Length: 12.1 cm. Normal morphology without mass or
hydronephrosis.

Left Kidney: Length: 11.6 cm. Normal morphology without mass or
hydronephrosis.

Abdominal aorta: Normal caliber

Other findings: No free fluid

ULTRASOUND HEPATIC ELASTOGRAPHY

Device: Siemens Helix VTQ

Patient position: Supine

Transducer 4V1

Number of measurements:  10

Hepatic Segment:  8

Median velocity:   3.55  m/sec

IQR:

IQR/Median velocity ratio

Corresponding Metavir fibrosis score:  F4 & some F3

Risk of fibrosis: High

Limitations of exam: Body habitus

Pertinent findings noted on other imaging exams:  None

Please note that abnormal shear wave velocities may also be
identified in clinical settings other than with hepatic fibrosis,
such as: acute hepatitis, elevated right heart and central venous
pressures including use of beta blockers, Raasch disease
([place]), infiltrative processes such as
mastocytosis/amyloidosis/infiltrative tumor, extrahepatic
cholestasis, in the post-prandial state, and liver transplantation.
Correlation with patient history, laboratory data, and clinical
condition recommended.
IMPRESSION: Post cholecystectomy.

Suspected cirrhotic appearing liver with poor evaluation of
intrahepatic detail due to sound attenuation.

If better intrahepatic evaluation is required recommend either CT or
MR imaging with and without contrast.

Minimal splenic enlargement.

Median hepatic shear wave velocity is calculated at 3.55 m/sec.

Corresponding Metavir fibrosis score is F4 & some F3.

Risk of fibrosis is high.

Follow-up:  Advised

## 2016-11-05 ENCOUNTER — Encounter: Payer: Self-pay | Admitting: *Deleted

## 2016-11-07 ENCOUNTER — Encounter: Payer: Self-pay | Admitting: Gastroenterology

## 2016-11-09 DIAGNOSIS — J449 Chronic obstructive pulmonary disease, unspecified: Secondary | ICD-10-CM | POA: Diagnosis not present

## 2016-11-11 DIAGNOSIS — J449 Chronic obstructive pulmonary disease, unspecified: Secondary | ICD-10-CM | POA: Diagnosis not present

## 2016-11-19 ENCOUNTER — Ambulatory Visit: Payer: Medicare HMO | Admitting: Nurse Practitioner

## 2016-11-19 DIAGNOSIS — Z0289 Encounter for other administrative examinations: Secondary | ICD-10-CM

## 2016-12-05 DIAGNOSIS — J449 Chronic obstructive pulmonary disease, unspecified: Secondary | ICD-10-CM | POA: Diagnosis not present

## 2016-12-06 ENCOUNTER — Ambulatory Visit: Payer: Medicare Other | Admitting: Internal Medicine

## 2016-12-09 DIAGNOSIS — Z79899 Other long term (current) drug therapy: Secondary | ICD-10-CM | POA: Diagnosis not present

## 2016-12-09 DIAGNOSIS — Z85858 Personal history of malignant neoplasm of other endocrine glands: Secondary | ICD-10-CM | POA: Diagnosis not present

## 2016-12-09 DIAGNOSIS — M13112 Monoarthritis, not elsewhere classified, left shoulder: Secondary | ICD-10-CM | POA: Diagnosis not present

## 2016-12-09 DIAGNOSIS — R69 Illness, unspecified: Secondary | ICD-10-CM | POA: Diagnosis not present

## 2016-12-09 DIAGNOSIS — E039 Hypothyroidism, unspecified: Secondary | ICD-10-CM | POA: Diagnosis not present

## 2016-12-09 DIAGNOSIS — I10 Essential (primary) hypertension: Secondary | ICD-10-CM | POA: Diagnosis not present

## 2016-12-09 DIAGNOSIS — R197 Diarrhea, unspecified: Secondary | ICD-10-CM | POA: Diagnosis not present

## 2016-12-09 DIAGNOSIS — K649 Unspecified hemorrhoids: Secondary | ICD-10-CM | POA: Diagnosis not present

## 2016-12-09 DIAGNOSIS — R0600 Dyspnea, unspecified: Secondary | ICD-10-CM | POA: Diagnosis not present

## 2016-12-09 DIAGNOSIS — R600 Localized edema: Secondary | ICD-10-CM | POA: Diagnosis not present

## 2016-12-09 DIAGNOSIS — Z853 Personal history of malignant neoplasm of breast: Secondary | ICD-10-CM | POA: Diagnosis not present

## 2016-12-09 DIAGNOSIS — E785 Hyperlipidemia, unspecified: Secondary | ICD-10-CM | POA: Diagnosis not present

## 2016-12-09 DIAGNOSIS — E1142 Type 2 diabetes mellitus with diabetic polyneuropathy: Secondary | ICD-10-CM | POA: Diagnosis not present

## 2016-12-09 DIAGNOSIS — G4701 Insomnia due to medical condition: Secondary | ICD-10-CM | POA: Diagnosis not present

## 2016-12-09 DIAGNOSIS — Z794 Long term (current) use of insulin: Secondary | ICD-10-CM | POA: Diagnosis not present

## 2016-12-09 DIAGNOSIS — Z79891 Long term (current) use of opiate analgesic: Secondary | ICD-10-CM | POA: Diagnosis not present

## 2016-12-09 DIAGNOSIS — Z791 Long term (current) use of non-steroidal anti-inflammatories (NSAID): Secondary | ICD-10-CM | POA: Diagnosis not present

## 2016-12-09 DIAGNOSIS — M13111 Monoarthritis, not elsewhere classified, right shoulder: Secondary | ICD-10-CM | POA: Diagnosis not present

## 2016-12-09 DIAGNOSIS — Z0000 Encounter for general adult medical examination without abnormal findings: Secondary | ICD-10-CM | POA: Diagnosis not present

## 2016-12-09 NOTE — Progress Notes (Signed)
Subjective:    Patient ID: Mary Acosta, female    DOB: Jan 12, 1956, 61 y.o.   MRN: [id number]  HPI The patient is here for follow up.  Hypertension: She is taking her medication daily. She is compliant with a low sodium diet.  She denies chest pain, palpitations, shortness of breath and regular headaches. She is not exercising regularly.      Diabetes: She is seeing Dr Loanne Drilling.  She is taking her medication daily as prescribed. She is not as compliant with a diabetic diet as she should be. She is not exercising regularly.  She is up-to-date with an ophthalmology examination.   Hypothyroidism:  She is following with Dr Loanne Drilling.  She is taking her medication daily.  She denies any recent changes in energy or weight that are unexplained.   She had PMR for 5 years and was on a lot of prednisone and pain medication.  She feels it has come back.  She wants to go back on prednisone.  Her pain is intermittent.   Medications and allergies reviewed with patient and updated if appropriate.  Patient Active Problem List   Diagnosis Date Noted  . Gastroparesis   . Memory difficulty 07/25/2016  . Pain in toe 04/09/2016  . Lump in neck 01/29/2016  . Chronic diastolic (congestive) heart failure ([place]) 01/03/2016  . Fatty liver 01/03/2016  . Type 1 diabetes mellitus ([place]) 01/03/2016  . Recurrent Clostridium difficile diarrhea 01/03/2016  . Cirrhosis of liver without ascites ([place]) 12/07/2015  . Chronic respiratory failure ([place]) 11/10/2014  . Physical deconditioning 09/26/2014  . Dyspnea 09/26/2014  . HCAP (healthcare-associated pneumonia) 08/26/2014  . COPD (chronic obstructive pulmonary disease) ([place]) 08/26/2014  . Iron deficiency anemia, unspecified  04/05/2011  . Bariatric surgery status 04/05/2011  . Left arm pain   . UNSPECIFIED VITAMIN D DEFICIENCY 10/22/2007  . LOW BACK PAIN, CHRONIC 10/22/2007  . INSOMNIA 10/22/2007  . Obesity 10/14/2007  . Chronic pain syndrome 10/14/2007    . CARPAL TUNNEL SYNDROME 10/14/2007  . PERIPHERAL NEUROPATHY 10/14/2007  . ALLERGIC RHINITIS CAUSE UNSPECIFIED 10/14/2007  . ARTHRITIS 10/14/2007  . Hypothyroid 10/14/2007  . HLD (hyperlipidemia) 09/14/2007  . Anxiety state 08/25/2007  . Depression 08/25/2007  . Essential hypertension 08/25/2007  . ASTHMA 08/25/2007  . CONSTIPATION 08/25/2007  . POLYMYALGIA RHEUMATICA 08/25/2007  . LEG EDEMA, BILATERAL 08/25/2007    Current Outpatient Prescriptions on File Prior to Visit  Medication Sig Dispense Refill  . albuterol (PROVENTIL HFA;VENTOLIN HFA) 108 (90 BASE) MCG/ACT inhaler Inhale 2 puffs into the lungs every 6 (six) hours as needed for wheezing or shortness of breath. 1 Inhaler 2  . amitriptyline (ELAVIL) 50 MG tablet Take 100 mg by mouth daily.   5  . atorvastatin (LIPITOR) 40 MG tablet Take 1 tablet (40 mg total) by mouth daily. 90 tablet 3  . desvenlafaxine (PRISTIQ) 50 MG 24 hr tablet     . HYDROmorphone (DILAUDID) 4 MG tablet Take 4 mg by mouth every 6 (six) hours as needed for moderate pain.   0  . hydrOXYzine (ATARAX/VISTARIL) 25 MG tablet Take 25 mg by mouth 2 (two) times daily.     . insulin NPH Human (NOVOLIN N) 100 UNIT/ML injection Inject 1.2 mLs (120 Units total) into the skin every morning. 40 mL 11  . Insulin Syringe-Needle U-100 (INSULIN SYRINGE 1CC/30GX5/16") 30G X 5/16" 1 ML MISC Use as directed for insulin injections 5 times daily 150 each 1  . ipratropium-albuterol (DUONEB) 0.5-2.5 (3)  MG/3ML SOLN Take 3 mLs by nebulization 4 (four) times daily. (Patient taking differently: Take 3 mLs by nebulization every 6 (six) hours as needed. ) 360 mL 11  . levothyroxine (SYNTHROID, LEVOTHROID) 150 MCG tablet Take 150 mcg by mouth daily before breakfast.    . meloxicam (MOBIC) 7.5 MG tablet Take 7.5 mg by mouth 2 (two) times daily.   5  . metoCLOPramide (REGLAN) 5 MG tablet Take 1 tablet (5 mg total) by mouth 3 (three) times daily before meals. 90 tablet 2  . morphine (MS  CONTIN) 30 MG 12 hr tablet Take 30 mg by mouth 2 (two) times daily.    . potassium chloride SA (K-DUR,KLOR-CON) 20 MEQ tablet Take 2 tablets by mouth daily as needed for cramping. 180 tablet 0  . tiZANidine (ZANAFLEX) 2 MG tablet Take 2 mg by mouth 3 (three) times daily.  2  . valsartan (DIOVAN) 160 MG tablet Take 1 tablet (160 mg total) by mouth daily. 90 tablet 3   No current facility-administered medications on file prior to visit.     Past Medical History:  Diagnosis Date  . Anxiety    with panic attacks  . Arthritis    "back; feet; hands; shoulders" (08/26/2014)  . Asthma   . Cervical cancer ([place])   . Chronic lower back pain   . Chronic narcotic use   . Chronic pain syndrome    [facility]  . Cirrhosis ([place])   . COPD (chronic obstructive pulmonary disease) ([place])   . Daily headache   . Depression   . DJD (degenerative joint disease)   . Fatty liver disease, nonalcoholic   . Fibromyalgia   . Frequency of urination   . HCAP (healthcare-associated pneumonia) 08/26/2014  . History of TIA (transient ischemic attack) 11-01-2010   NO RESIDUAL  . Hyperlipidemia   . Hypertension   . Hypothyroidism   . IDDM (insulin dependent diabetes mellitus) ([place])   . Insomnia   . Lumbar stenosis   . Memory difficulty 07/25/2016  . Nocturia   . OSA (obstructive sleep apnea)    NO CPAP SINCE WT LOSS  . Osteoarthritis    with severe disease in knee  . Pneumonia "several times"  . Polymyalgia rheumatica ([place])   . Scoliosis   . Seasonal allergies   . Thyroid cancer ([place])   . Urgency of urination   . Vaginal pain S/P SLING  FEB 2012    Past Surgical History:  Procedure Laterality Date  . APPENDECTOMY  1982  . BREAST LUMPECTOMY Left 02-28-2005   ATYPICAL DUCTAL HYPERPLASIA  . CARDIAC CATHETERIZATION  09-04-2004   NORMAL CORONARY ANATOMY/ NORMAL LVF/ EF 60%  . CARDIOVASCULAR STRESS TEST  12-27-2010  DR [place]   ABNORMAL NUCLEAR STUDY W/ /MILD INFERIOR ISCHEMIA/ EF 69%/  CT  HEART ANGIOGRAM ;  NO ACUTE FINDINGS  . CRYOABLATION  05/16/2003   w/LEEP FOR ABNORMAL PAP SMEAR  . CYSTOSCOPY  05/18/2012   Procedure: CYSTOSCOPY;  Surgeon: Reece Packer, MD;  Location[facility];  Service: Urology;  Laterality: N/A;  examination under anethesia  . ESOPHAGOGASTRODUODENOSCOPY (EGD) WITH PROPOFOL N/A 09/03/2016   Procedure: ESOPHAGOGASTRODUODENOSCOPY (EGD) WITH PROPOFOL;  Surgeon: Doran Stabler, MD;  Location: [facility];  Service: Gastroenterology;  Laterality: N/A;  . HYSTEROSCOPY W/D&C  08-19-2007   PMB  . KNEE ARTHROSCOPY W/ DEBRIDEMENT Left 03/29/2006   INTERNAL DERANGEMENT/ SEVERE DJD/ MENISCUS TEARS  . LAPAROSCOPIC CHOLECYSTECTOMY  06-10-2002  . LAPAROSCOPIC  GASTRIC BANDING  03/01/2006   TRUNCAL VAGOTOMY/ PLACEMENT OF VG BAND  . REVISION TOTAL KNEE ARTHROPLASTY Left 08-29-2008; 05/2009  . TONSILLECTOMY  1969  . TOTAL KNEE ARTHROPLASTY Left 01-23-2007   SEVERE DJD  . TOTAL THYROIDECTOMY  11-22-2005   BILATERAL THYROID NODULES-- PAPILLARY CARCINOMA (0.5CM)/ ADENOMATOID NODULES  . TRANSTHORACIC ECHOCARDIOGRAM  12-27-2010   LVSF NORMAL / EF 50-03%/ GRADE I DIASTOLIC DYSFUNCTION/ MILD MITRAL REGURG. / MILDLY DILATED LEFT ATRIUM/ MILDY INCREASED SYSTOLIC PRESSURE OF PULMONARY ARTERIES  . TRANSVAGINAL SUBURETERAL TAPE/ SLING  09-28-2010   MIXED URINARY INCONTINENCE  . TUBAL LIGATION  1983    Social History   Social History  . Marital status: Married    Spouse name: N/A  . Number of children: 2  . Years of education: 12   Occupational History  . disabled Unemployed   Social History Main Topics  . Smoking status: Former Smoker    Packs/day: 2.50    Years: 39.00    Types: Cigarettes    Quit date: 10/22/2010  . Smokeless tobacco: Never Used  . Alcohol use No  . Drug use: No  . Sexual activity: Not Currently   Other Topics Concern  . Not on file   Social History Narrative   Lives at home w/ her husband and grandson   Right-handed     Caffeine: 1 cup of coffee per week + [employer]    Family History  Problem Relation Age of Onset  . Diabetes Mother   . Heart disease Mother   . Dementia Mother   . Heart disease Father   . High blood pressure Father   . Colon cancer Maternal Uncle     x 2  . Breast cancer Other     great aunts x 5    Review of Systems  Constitutional: Negative for fever.  Respiratory: Negative for cough, shortness of breath and wheezing.   Cardiovascular: Positive for leg swelling (chronic). Negative for chest pain and palpitations.  Gastrointestinal: Negative for abdominal pain.  Neurological: Negative for light-headedness and headaches.       Objective:   Vitals:   12/10/16 1104  BP: 124/84  Pulse: 77  Temp: 98.4 F (36.9 C)   Wt Readings from Last 3 Encounters:  12/10/16 297 lb 1.9 oz (134.8 kg)  09/26/16 287 lb (130.2 kg)  09/05/16 286 lb (129.7 kg)   Body mass index is 45.18 kg/m.   Physical Exam    Constitutional: Appears well-developed and well-nourished. No distress.  HENT:  Head: Normocephalic and atraumatic.  Neck: Neck supple. No tracheal deviation present. No thyromegaly present.  No cervical lymphadenopathy Cardiovascular: Normal rate, regular rhythm and normal heart sounds.   No murmur heard. No carotid bruit .  No edema Pulmonary/Chest: Effort normal and breath sounds normal. No respiratory distress. No has no wheezes. No rales.  Skin: Skin is warm and dry. Not diaphoretic.  Psychiatric: Normal mood and affect. Behavior is normal.      Assessment & Plan:    See Problem List for Assessment and Plan of chronic medical problems.

## 2016-12-10 ENCOUNTER — Other Ambulatory Visit (INDEPENDENT_AMBULATORY_CARE_PROVIDER_SITE_OTHER): Payer: Medicare HMO

## 2016-12-10 ENCOUNTER — Encounter: Payer: Self-pay | Admitting: Internal Medicine

## 2016-12-10 ENCOUNTER — Ambulatory Visit (INDEPENDENT_AMBULATORY_CARE_PROVIDER_SITE_OTHER): Payer: Medicare HMO | Admitting: Internal Medicine

## 2016-12-10 VITALS — BP 124/84 | HR 77 | Temp 98.4°F | Ht 68.0 in | Wt 297.1 lb

## 2016-12-10 DIAGNOSIS — I10 Essential (primary) hypertension: Secondary | ICD-10-CM

## 2016-12-10 DIAGNOSIS — M791 Myalgia, unspecified site: Secondary | ICD-10-CM

## 2016-12-10 DIAGNOSIS — E038 Other specified hypothyroidism: Secondary | ICD-10-CM

## 2016-12-10 DIAGNOSIS — F329 Major depressive disorder, single episode, unspecified: Secondary | ICD-10-CM

## 2016-12-10 DIAGNOSIS — F32A Depression, unspecified: Secondary | ICD-10-CM

## 2016-12-10 DIAGNOSIS — R69 Illness, unspecified: Secondary | ICD-10-CM | POA: Diagnosis not present

## 2016-12-10 DIAGNOSIS — E1042 Type 1 diabetes mellitus with diabetic polyneuropathy: Secondary | ICD-10-CM | POA: Diagnosis not present

## 2016-12-10 DIAGNOSIS — F411 Generalized anxiety disorder: Secondary | ICD-10-CM | POA: Diagnosis not present

## 2016-12-10 DIAGNOSIS — J449 Chronic obstructive pulmonary disease, unspecified: Secondary | ICD-10-CM | POA: Diagnosis not present

## 2016-12-10 LAB — TSH: TSH: 6.67 u[IU]/mL — AB (ref 0.35–4.50)

## 2016-12-10 LAB — HEMOGLOBIN A1C: Hgb A1c MFr Bld: 13.5 % — ABNORMAL HIGH (ref 4.6–6.5)

## 2016-12-10 LAB — COMPREHENSIVE METABOLIC PANEL
ALK PHOS: 116 U/L (ref 39–117)
ALT: 13 U/L (ref 0–35)
AST: 14 U/L (ref 0–37)
Albumin: 3.6 g/dL (ref 3.5–5.2)
BUN: 11 mg/dL (ref 6–23)
CO2: 30 mEq/L (ref 19–32)
CREATININE: 1.01 mg/dL (ref 0.40–1.20)
Calcium: 9.7 mg/dL (ref 8.4–10.5)
Chloride: 97 mEq/L (ref 96–112)
GFR: 59.33 mL/min — ABNORMAL LOW (ref >60.00)
GLUCOSE: 478 mg/dL — AB (ref 70–99)
POTASSIUM: 4.8 meq/L (ref 3.5–5.1)
SODIUM: 133 meq/L — AB (ref 135–145)
TOTAL PROTEIN: 6.6 g/dL (ref 6.0–8.3)
Total Bilirubin: 0.4 mg/dL (ref 0.2–1.2)

## 2016-12-10 LAB — C-REACTIVE PROTEIN: CRP: 1.2 mg/dL (ref 0.5–20.0)

## 2016-12-10 LAB — SEDIMENTATION RATE: Sed Rate: 39 mm/hr — ABNORMAL HIGH (ref 0–30)

## 2016-12-10 MED ORDER — DESVENLAFAXINE SUCCINATE ER 50 MG PO TB24: 50.0000 mg | ORAL_TABLET | Freq: Every day | ORAL | 5 refills | Status: DC

## 2016-12-10 MED ORDER — LEVOTHYROXINE SODIUM 150 MCG PO TABS: 150.0000 ug | ORAL_TABLET | Freq: Every day | ORAL | 5 refills | Status: DC

## 2016-12-10 NOTE — Patient Instructions (Signed)
  Test(s) ordered today. Your results will be released to MyChart (or called to you) after review, usually within 72hours after test completion. If any changes need to be made, you will be notified at that same time.   Medications reviewed and updated.  No changes recommended at this time.  Your prescription(s) have been submitted to your pharmacy. Please take as directed and contact our office if you believe you are having problem(s) with the medication(s).    Please followup in 6 months   

## 2016-12-10 NOTE — Assessment & Plan Note (Signed)
BP well controlled Current regimen effective and well tolerated Continue current medications at current doses  

## 2016-12-10 NOTE — Assessment & Plan Note (Addendum)
Anxiety controlled with as needed vistaril or benadryl Continue pristiq

## 2016-12-10 NOTE — Assessment & Plan Note (Signed)
Check tsh Will have Dr Loanne Drilling manage given history of thyroid cancer

## 2016-12-10 NOTE — Progress Notes (Signed)
Pre visit review using our clinic review tool, if applicable. No additional management support is needed unless otherwise documented below in the visit note. 

## 2016-12-10 NOTE — Assessment & Plan Note (Addendum)
Shoulder and legs - muscle are sore and hard as a rock Feels like her PMR Pain is intermittent Will check esr, crp Wants steroids, but this does not sound like PMR - will check labs

## 2016-12-10 NOTE — Assessment & Plan Note (Signed)
Management per Dr Loanne Drilling Stressed improving her diet Work on weight loss Will check a1c

## 2016-12-10 NOTE — Assessment & Plan Note (Signed)
Controlled, stable Continue current dose of medication  

## 2016-12-11 ENCOUNTER — Encounter: Payer: Self-pay | Admitting: Internal Medicine

## 2016-12-12 ENCOUNTER — Other Ambulatory Visit: Payer: Self-pay | Admitting: Internal Medicine

## 2016-12-12 ENCOUNTER — Encounter: Payer: Self-pay | Admitting: Internal Medicine

## 2016-12-12 MED ORDER — LEVOTHYROXINE SODIUM 175 MCG PO TABS: 175.0000 ug | ORAL_TABLET | Freq: Every day | ORAL | 1 refills | Status: DC

## 2016-12-12 MED ORDER — FLUCONAZOLE 150 MG PO TABS: 150.0000 mg | ORAL_TABLET | Freq: Once | ORAL | 0 refills | Status: AC

## 2016-12-19 ENCOUNTER — Ambulatory Visit (INDEPENDENT_AMBULATORY_CARE_PROVIDER_SITE_OTHER): Payer: Medicare HMO | Admitting: Gastroenterology

## 2016-12-19 ENCOUNTER — Encounter: Payer: Self-pay | Admitting: Gastroenterology

## 2016-12-19 VITALS — BP 124/86 | HR 74 | Ht 69.0 in | Wt 294.0 lb

## 2016-12-19 DIAGNOSIS — K3184 Gastroparesis: Secondary | ICD-10-CM

## 2016-12-19 DIAGNOSIS — R110 Nausea: Secondary | ICD-10-CM

## 2016-12-19 DIAGNOSIS — K7460 Unspecified cirrhosis of liver: Secondary | ICD-10-CM

## 2016-12-19 DIAGNOSIS — R140 Abdominal distension (gaseous): Secondary | ICD-10-CM | POA: Diagnosis not present

## 2016-12-19 NOTE — Progress Notes (Signed)
[facility] Progress Note  Chief Complaint: Cirrhosis and gastroparesis  Subjective  History:  This is follow-up for 61 year old woman with multiple chronic medical problems as outlined in my previous notes. She has cryptogenic cirrhosis, most likely from [place]. Upper endoscopy January 2018 revealed no varices. Her biggest problem continues to be poorly controlled diabetes, which is largely related to poor diet and sedentary lifestyle. She admits that she mostly sits around the house, does not even have the energy to [place], and does not follow her diabetic diet recommendations. She apparently has some improvement in her overall well-being and energy level with recent increase in the Synthroid dose. She can only tolerate taking Reglan about 3 times a week because it reportedly increases her abdominal bloating.   ROS: Cardiovascular:  no chest pain Respiratory: no dyspnea  The patient's Past Medical, Family and Social History were reviewed and are on file in the EMR.  Objective:  Med list reviewed  Vital signs in last 24 hrs: Vitals:   12/19/16 1119  BP: 124/86  Pulse: 74    Physical Exam  Chronically ill-appearing woman  HEENT: sclera anicteric, oral mucosa moist without lesions  Neck: supple, no thyromegaly, JVD or lymphadenopathy  Cardiac: RRR without murmurs, S1S2 heard, no peripheral edema. Venous stasis changes on the lower legs  Pulm: clear to auscultation bilaterally, normal RR and effort noted  Abdomen: soft, morbidly obese, no tenderness, with active bowel sounds. No guarding or palpable hepatosplenomegaly, but exam limited by BMI  Skin; warm and dry, no jaundice or rash  Recent Labs:  CMP Latest Ref Rng & Units 12/10/2016 08/12/2016 01/29/2016  Glucose 70 - 99 mg/dL 478(H) 332(H) 333(H)  BUN 6 - 23 mg/dL 11 14 19   Creatinine 0.40 - 1.20 mg/dL 1.01 1.13 0.91  Sodium 135 - 145 mEq/L 133(L) 133(L) 134(L)  Potassium 3.5 - 5.1 mEq/L 4.8 4.2 3.5    Chloride 96 - 112 mEq/L 97 95(L) 99  CO2 19 - 32 mEq/L 30 29 27   Calcium 8.4 - 10.5 mg/dL 9.7 10.4 8.9  Total Protein 6.0 - 8.3 g/dL 6.6 7.5 7.4  Total Bilirubin 0.2 - 1.2 mg/dL 0.4 0.3 0.5  Alkaline Phos 39 - 117 U/L 116 114 100  AST 0 - 37 U/L 14 24 19   ALT 0 - 35 U/L 13 21 11    Hgb A1C 13.5   @ASSESSMENTPLANBEGIN @ Assessment: Encounter Diagnoses  Name Primary?  . Cirrhosis of liver without ascites, unspecified hepatic cirrhosis type ([place]) Yes  . Nausea without vomiting   . Abdominal bloating   . Gastroparesis     The patient's cirrhosis is stable and compensated. Her intermittent nausea is clearly related to gastroparesis from poorly controlled diabetes. She is taking as much Reglan as she feels she can tolerate. She has been nonadherent with recommendations regarding her diabetes.  The diabetes remains to greatest threat to her health, and I am afraid I cannot be helpful to her in that regard. She was encouraged to do her best following recommendations, but I am concerned that her long-term prognosis is poor. She was wondering whether she should see her bariatric surgeon and consider having some fluid put into her lap band, but I think this would only exacerbate her upper GI motility issues since she is not compliant with dietary advice.  See me in 6 months or sooner as needed  Total time 20 minutes, over half spent in counseling and coordination of care.   [name]

## 2016-12-19 NOTE — Patient Instructions (Addendum)
If you are age 60 or older, your body mass index should be between 23-30. Your Body mass index is 43.42 kg/m. If this is out of the aforementioned range listed, please consider follow up with your Primary Care Provider.  If you are age 21 or younger, your body mass index should be between 19-25. Your Body mass index is 43.42 kg/m. If this is out of the aformentioned range listed, please consider follow up with your Primary Care Provider.   Follow up in 6 months.  Thank you for choosing [facility]  Dr Wilfrid Lund III

## 2016-12-21 DIAGNOSIS — E119 Type 2 diabetes mellitus without complications: Secondary | ICD-10-CM | POA: Diagnosis not present

## 2016-12-27 ENCOUNTER — Ambulatory Visit (INDEPENDENT_AMBULATORY_CARE_PROVIDER_SITE_OTHER): Payer: Medicare HMO | Admitting: Neurology

## 2016-12-27 ENCOUNTER — Encounter: Payer: Self-pay | Admitting: Neurology

## 2016-12-27 VITALS — BP 144/79 | HR 96 | Ht 69.0 in | Wt 289.5 lb

## 2016-12-27 DIAGNOSIS — G894 Chronic pain syndrome: Secondary | ICD-10-CM

## 2016-12-27 DIAGNOSIS — R413 Other amnesia: Secondary | ICD-10-CM

## 2016-12-27 MED ORDER — LEVETIRACETAM 500 MG PO TABS: ORAL_TABLET | ORAL | 3 refills | Status: DC

## 2016-12-27 MED ORDER — AMITRIPTYLINE HCL 25 MG PO TABS: ORAL_TABLET | ORAL | 3 refills | Status: DC

## 2016-12-27 NOTE — Progress Notes (Signed)
Reason for visit: Memory disturbance  Mary Acosta is an 61 y.o. female  History of present illness:  Mary Acosta is a 61 year old right-handed white female with a history of diabetes, diabetic peripheral neuropathy, morbid obesity, as well as anxiety and depression. The patient is on chronic opioid therapy, she has had demonstrated significant issues with oxygen desaturation in the evening hours, she is on oxygen at night. The patient is on amitriptyline taking 100 mg in the evening, she claims other therapies for her peripheral neuropathy pain have not been effective that included Cymbalta, Lyrica, and gabapentin. The patient continues to have troubles with insomnia, she claims that when she does sleep at night she feels worse during the morning. The patient does have excessive daytime drowsiness, she naps throughout the day, she is very inactive, she does not exercise at all. The patient continues to note some problems with memory. She has difficulty with remembering names and recent events.  Past Medical History:  Diagnosis Date  . Anxiety    with panic attacks  . Arthritis    "back; feet; hands; shoulders" (08/26/2014)  . Asthma   . Cervical cancer ([place])   . Chronic lower back pain   . Chronic narcotic use   . Chronic pain syndrome    [facility]  . Cirrhosis ([place])   . COPD (chronic obstructive pulmonary disease) ([place])   . Daily headache   . Depression   . DJD (degenerative joint disease)   . Fatty liver disease, nonalcoholic   . Fibromyalgia   . Frequency of urination   . HCAP (healthcare-associated pneumonia) 08/26/2014  . History of TIA (transient ischemic attack) 11-01-2010   NO RESIDUAL  . Hyperlipidemia   . Hypertension   . Hypothyroidism   . IDDM (insulin dependent diabetes mellitus) ([place])   . Insomnia   . Lumbar stenosis   . Memory difficulty 07/25/2016  . Nocturia   . OSA (obstructive sleep apnea)    NO CPAP SINCE WT LOSS  .  Osteoarthritis    with severe disease in knee  . Pneumonia "several times"  . Polymyalgia rheumatica ([place])   . Scoliosis   . Seasonal allergies   . Thyroid cancer ([place])   . Urgency of urination   . Vaginal pain S/P SLING  FEB 2012    Past Surgical History:  Procedure Laterality Date  . APPENDECTOMY  1982  . BREAST LUMPECTOMY Left 02-28-2005   ATYPICAL DUCTAL HYPERPLASIA  . CARDIAC CATHETERIZATION  09-04-2004   NORMAL CORONARY ANATOMY/ NORMAL LVF/ EF 60%  . CARDIOVASCULAR STRESS TEST  12-27-2010  DR [place]   ABNORMAL NUCLEAR STUDY W/ /MILD INFERIOR ISCHEMIA/ EF 69%/  CT HEART ANGIOGRAM ;  NO ACUTE FINDINGS  . CRYOABLATION  05/16/2003   w/LEEP FOR ABNORMAL PAP SMEAR  . CYSTOSCOPY  05/18/2012   Procedure: CYSTOSCOPY;  Surgeon: Reece Packer, MD;  Location: [facility];  Service: Urology;  Laterality: N/A;  examination under anethesia  . ESOPHAGOGASTRODUODENOSCOPY (EGD) WITH PROPOFOL N/A 09/03/2016   Procedure: ESOPHAGOGASTRODUODENOSCOPY (EGD) WITH PROPOFOL;  Surgeon: Doran Stabler, MD;  Location: [facility];  Service: Gastroenterology;  Laterality: N/A;  . HYSTEROSCOPY W/D&C  08-19-2007   PMB  . KNEE ARTHROSCOPY W/ DEBRIDEMENT Left 03/29/2006   INTERNAL DERANGEMENT/ SEVERE DJD/ MENISCUS TEARS  . LAPAROSCOPIC CHOLECYSTECTOMY  06-10-2002  . LAPAROSCOPIC GASTRIC BANDING  03/01/2006   TRUNCAL VAGOTOMY/ PLACEMENT OF VG BAND  . REVISION TOTAL KNEE ARTHROPLASTY Left  08-29-2008; 05/2009  . TONSILLECTOMY  1969  . TOTAL KNEE ARTHROPLASTY Left 01-23-2007   SEVERE DJD  . TOTAL THYROIDECTOMY  11-22-2005   BILATERAL THYROID NODULES-- PAPILLARY CARCINOMA (0.5CM)/ ADENOMATOID NODULES  . TRANSTHORACIC ECHOCARDIOGRAM  12-27-2010   LVSF NORMAL / EF 39-76%/ GRADE I DIASTOLIC DYSFUNCTION/ MILD MITRAL REGURG. / MILDLY DILATED LEFT ATRIUM/ MILDY INCREASED SYSTOLIC PRESSURE OF PULMONARY ARTERIES  . TRANSVAGINAL SUBURETERAL TAPE/ SLING  09-28-2010   MIXED URINARY INCONTINENCE  .  TUBAL LIGATION  1983    Family History  Problem Relation Age of Onset  . Diabetes Mother   . Heart disease Mother   . Dementia Mother   . Heart disease Father   . High blood pressure Father   . Colon cancer Maternal Uncle     x 2  . Breast cancer Other     great aunts x 5    Social history:  reports that she quit smoking about 6 years ago. Her smoking use included Cigarettes. She has a 97.50 pack-year smoking history. She has never used smokeless tobacco. She reports that she does not drink alcohol or use drugs.    Allergies  Allergen Reactions  . Gabapentin Swelling    Swelling in legs  . Losartan Other (See Comments)    Myalgias and muscle cramping  . Oxycodone Itching  . Sulfa Antibiotics Nausea Only and Rash  . Sulfonamide Derivatives Nausea Only    Medications:  Prior to Admission medications   Medication Sig Start Date End Date Taking? Authorizing Provider  albuterol (PROVENTIL HFA;VENTOLIN HFA) 108 (90 BASE) MCG/ACT inhaler Inhale 2 puffs into the lungs every 6 (six) hours as needed for wheezing or shortness of breath. 09/11/14  Yes Marianne L York, PA-C  amitriptyline (ELAVIL) 50 MG tablet Take 100 mg by mouth daily.  06/09/15  Yes Historical Provider, MD  atorvastatin (LIPITOR) 40 MG tablet Take 1 tablet (40 mg total) by mouth daily. 09/05/16 09/05/17 Yes Lelon Perla, MD  HYDROmorphone (DILAUDID) 4 MG tablet Take 4 mg by mouth every 6 (six) hours as needed for moderate pain.  07/04/15  Yes Historical Provider, MD  insulin NPH Human (NOVOLIN N) 100 UNIT/ML injection Inject 1.2 mLs (120 Units total) into the skin every morning. 05/30/16  Yes Renato Shin, MD  Insulin Syringe-Needle U-100 (INSULIN SYRINGE 1CC/30GX5/16") 30G X 5/16" 1 ML MISC Use as directed for insulin injections 5 times daily 01/29/16  Yes Binnie Rail, MD  ipratropium-albuterol (DUONEB) 0.5-2.5 (3) MG/3ML SOLN Take 3 mLs by nebulization 4 (four) times daily. Patient taking differently: Take 3 mLs by  nebulization every 6 (six) hours as needed.  10/20/14  Yes Brand Males, MD  levothyroxine (SYNTHROID, LEVOTHROID) 175 MCG tablet Take 1 tablet (175 mcg total) by mouth daily before breakfast. 12/12/16  Yes Binnie Rail, MD  meloxicam (MOBIC) 7.5 MG tablet Take 7.5 mg by mouth 2 (two) times daily.  06/30/15  Yes Historical Provider, MD  metoCLOPramide (REGLAN) 5 MG tablet Take 1 tablet (5 mg total) by mouth 3 (three) times daily before meals. 09/03/16  Yes [name], MD  morphine (MS CONTIN) 30 MG 12 hr tablet Take 30 mg by mouth 2 (two) times daily. 09/12/15  Yes Historical Provider, MD  potassium chloride SA (K-DUR,KLOR-CON) 20 MEQ tablet Take 2 tablets by mouth daily as needed for cramping. 05/02/16  Yes Binnie Rail, MD  tiZANidine (ZANAFLEX) 2 MG tablet Take 2 mg by mouth 3 (three) times daily. 05/30/15  Yes Historical Provider, MD  valsartan (DIOVAN) 160 MG tablet Take 1 tablet (160 mg total) by mouth daily. 09/05/16  Yes Lelon Perla, MD  hydrOXYzine (ATARAX/VISTARIL) 25 MG tablet Take 25 mg by mouth 2 (two) times daily.  05/02/16   Historical Provider, MD    ROS:  Out of a complete 14 system review of symptoms, the patient complains only of the following symptoms, and all other reviewed systems are negative.  Fatigue Hearing loss, drooling Blurred vision Shortness of breath Leg swelling Cold intolerance, heat intolerance, flushing Constipation Food allergies Frequency of urination, urinary urgency Joint pain, joint swelling, back pain, achy muscles, muscle cramps, walking difficulty, neck pain, neck stiffness Decreased concentration, anxiety  Blood pressure (!) 144/79, pulse 96, height 5\' 9"  (1.753 m), weight 289 lb 8 oz (131.3 kg).  Physical Exam  General: The patient is alert and cooperative at the time of the examination. The patient is morbidly obese.  Skin: No significant peripheral edema is noted.   Neurologic Exam  Mental status: The patient is alert and  oriented x 3 at the time of the examination. The Mini-Mental Status Examination done today shows a total score 26/30.   Cranial nerves: Facial symmetry is present. Speech is normal, no aphasia or dysarthria is noted. Extraocular movements are full. Visual fields are full.  Motor: The patient has good strength in all 4 extremities.  Sensory examination: Soft touch sensation is symmetric on the face, arms, and legs.  Coordination: The patient has good finger-nose-finger and heel-to-shin bilaterally.  Gait and station: The patient has a slightly wide-based gait, the patient can walk independently. Tandem gait is unsteady. Romberg is negative, but is unsteady. No drift is seen.  Reflexes: Deep tendon reflexes are symmetric, but are depressed.   Assessment/Plan:  1. Morbid obesity  2. Diabetes, diabetic peripheral neuropathy  3. Chronic opioid use  4. Memory disturbance  The patient is suffering from polypharmacy in regards to her memory. The patient is on anticholinergic medication such as amitriptyline, she has had significant issues with desaturation of oxygen at night, she likely has a component of central apnea. The patient will be reduced on amitriptyline dose taking 75 mg at night for 8 weeks and then go to 50 mg at night. We will initiate Keppra for the neuropathy pain taking 250 mg twice daily for 2 weeks and then go to 500 mg twice daily. The patient will follow-up in 4 or 5 months. She will call for any dose adjustments. Appropriate pain management with opioids could potentially include a pump placement using morphine rather than taking oral opiates to help minimize the systemic side effects of these medications.  Jill Alexanders MD 12/27/2016 9:12 AM  [facility] [address] [place], [place] [address]  Phone [phone] Fax [phone]

## 2016-12-27 NOTE — Patient Instructions (Signed)
   We will decrease the amitriptyline to 75 mg at night, and start keppra for the neuropathy pain.

## 2017-01-06 DIAGNOSIS — M5137 Other intervertebral disc degeneration, lumbosacral region: Secondary | ICD-10-CM | POA: Diagnosis not present

## 2017-01-06 DIAGNOSIS — Z5181 Encounter for therapeutic drug level monitoring: Secondary | ICD-10-CM | POA: Diagnosis not present

## 2017-01-06 DIAGNOSIS — Z0289 Encounter for other administrative examinations: Secondary | ICD-10-CM | POA: Diagnosis not present

## 2017-01-06 DIAGNOSIS — G894 Chronic pain syndrome: Secondary | ICD-10-CM | POA: Diagnosis not present

## 2017-01-06 DIAGNOSIS — M533 Sacrococcygeal disorders, not elsewhere classified: Secondary | ICD-10-CM | POA: Diagnosis not present

## 2017-01-09 DIAGNOSIS — J449 Chronic obstructive pulmonary disease, unspecified: Secondary | ICD-10-CM | POA: Diagnosis not present

## 2017-01-10 ENCOUNTER — Other Ambulatory Visit (INDEPENDENT_AMBULATORY_CARE_PROVIDER_SITE_OTHER): Payer: Medicare HMO

## 2017-01-10 ENCOUNTER — Encounter: Payer: Self-pay | Admitting: Internal Medicine

## 2017-01-10 ENCOUNTER — Ambulatory Visit (INDEPENDENT_AMBULATORY_CARE_PROVIDER_SITE_OTHER): Payer: Medicare HMO | Admitting: Internal Medicine

## 2017-01-10 VITALS — BP 168/100 | HR 99 | Temp 98.1°F | Resp 14 | Ht 69.0 in | Wt 291.0 lb

## 2017-01-10 DIAGNOSIS — I10 Essential (primary) hypertension: Secondary | ICD-10-CM | POA: Diagnosis not present

## 2017-01-10 DIAGNOSIS — F411 Generalized anxiety disorder: Secondary | ICD-10-CM | POA: Diagnosis not present

## 2017-01-10 DIAGNOSIS — R69 Illness, unspecified: Secondary | ICD-10-CM | POA: Diagnosis not present

## 2017-01-10 DIAGNOSIS — R413 Other amnesia: Secondary | ICD-10-CM | POA: Diagnosis not present

## 2017-01-10 LAB — CBC
HCT: 41.4 % (ref 36.0–46.0)
HEMOGLOBIN: 13.2 g/dL (ref 12.0–15.0)
MCHC: 32 g/dL (ref 30.0–36.0)
MCV: 77.7 fl — AB (ref 78.0–100.0)
PLATELETS: 160 10*3/uL (ref 150.0–400.0)
RBC: 5.33 Mil/uL — ABNORMAL HIGH (ref 3.87–5.11)
RDW: 14.2 % (ref 11.5–15.5)
WBC: 7.7 10*3/uL (ref 4.0–10.5)

## 2017-01-10 LAB — COMPREHENSIVE METABOLIC PANEL
ALT: 27 U/L (ref 0–35)
AST: 34 U/L (ref 0–37)
Albumin: 4 g/dL (ref 3.5–5.2)
Alkaline Phosphatase: 125 U/L — ABNORMAL HIGH (ref 39–117)
BUN: 14 mg/dL (ref 6–23)
CHLORIDE: 98 meq/L (ref 96–112)
CO2: 31 meq/L (ref 19–32)
Calcium: 9.1 mg/dL (ref 8.4–10.5)
Creatinine, Ser: 0.89 mg/dL (ref 0.40–1.20)
GFR: 68.63 mL/min (ref >60.00)
GLUCOSE: 308 mg/dL — AB (ref 70–99)
POTASSIUM: 4.2 meq/L (ref 3.5–5.1)
Sodium: 136 mEq/L (ref 135–145)
Total Bilirubin: 0.6 mg/dL (ref 0.2–1.2)
Total Protein: 7.5 g/dL (ref 6.0–8.3)

## 2017-01-10 LAB — TROPONIN I: TNIDX: 0.01 ug/L (ref 0.00–0.06)

## 2017-01-10 NOTE — Patient Instructions (Addendum)
We will check the labs today for signs of problems from the blood pressure.   Start taking 1 diovan in the morning and monitor. If blood pressure is still >160/90 call us back and we may need to increase the medication.   Dr. Quay Burow would like to see you back in about 2 weeks to check in on the blood pressure and other problems.   It is important to have a low salt diet to help with blood pressure control.    DASH Eating Plan DASH stands for "Dietary Approaches to Stop Hypertension." The DASH eating plan is a healthy eating plan that has been shown to reduce high blood pressure (hypertension). It may also reduce your risk for type 2 diabetes, heart disease, and stroke. The DASH eating plan may also help with weight loss. What are tips for following this plan? General guidelines   Avoid eating more than 2,300 mg (milligrams) of salt (sodium) a day. If you have hypertension, you may need to reduce your sodium intake to 1,500 mg a day.  Limit alcohol intake to no more than 1 drink a day for nonpregnant women and 2 drinks a day for men. One drink equals 12 oz of beer, 5 oz of wine, or 1 oz of hard liquor.  Work with your health care provider to maintain a healthy body weight or to lose weight. Ask what an ideal weight is for you.  Get at least 30 minutes of exercise that causes your heart to beat faster (aerobic exercise) most days of the week. Activities may include walking, swimming, or biking.  Work with your health care provider or diet and nutrition specialist (dietitian) to adjust your eating plan to your individual calorie needs. Reading food labels   Check food labels for the amount of sodium per serving. Choose foods with less than 5 percent of the Daily Value of sodium. Generally, foods with less than 300 mg of sodium per serving fit into this eating plan.  To find whole grains, look for the word "whole" as the first word in the ingredient list. Shopping   Buy products labeled as  "low-sodium" or "no salt added."  Buy fresh foods. Avoid canned foods and premade or frozen meals. Cooking   Avoid adding salt when cooking. Use salt-free seasonings or herbs instead of table salt or sea salt. Check with your health care provider or pharmacist before using salt substitutes.  Do not fry foods. Cook foods using healthy methods such as baking, boiling, grilling, and broiling instead.  Cook with heart-healthy oils, such as olive, canola, soybean, or sunflower oil. Meal planning    Eat a balanced diet that includes:  5 or more servings of fruits and vegetables each day. At each meal, try to fill half of your plate with fruits and vegetables.  Up to 6-8 servings of whole grains each day.  Less than 6 oz of lean meat, poultry, or fish each day. A 3-oz serving of meat is about the same size as a deck of cards. One egg equals 1 oz.  2 servings of low-fat dairy each day.  A serving of nuts, seeds, or beans 5 times each week.  Heart-healthy fats. Healthy fats called Omega-3 fatty acids are found in foods such as flaxseeds and coldwater fish, like sardines, salmon, and mackerel.  Limit how much you eat of the following:  Canned or prepackaged foods.  Food that is high in trans fat, such as fried foods.  Food that is high  in saturated fat, such as fatty meat.  Sweets, desserts, sugary drinks, and other foods with added sugar.  Full-fat dairy products.  Do not salt foods before eating.  Try to eat at least 2 vegetarian meals each week.  Eat more home-cooked food and less restaurant, buffet, and fast food.  When eating at a restaurant, ask that your food be prepared with less salt or no salt, if possible. What foods are recommended? The items listed may not be a complete list. Talk with your dietitian about what dietary choices are best for you. Grains  Whole-grain or whole-wheat bread. Whole-grain or whole-wheat pasta. Brown rice. [place]ena Morrow. Bulgur.  Whole-grain and low-sodium cereals. Pita bread. Low-fat, low-sodium crackers. Whole-wheat flour tortillas. Vegetables  Fresh or frozen vegetables (raw, steamed, roasted, or grilled). Low-sodium or reduced-sodium tomato and vegetable juice. Low-sodium or reduced-sodium tomato sauce and tomato paste. Low-sodium or reduced-sodium canned vegetables. Fruits  All fresh, dried, or frozen fruit. Canned fruit in natural juice (without added sugar). Meat and other protein foods  Skinless chicken or [place]. Ground chicken or [place]. Pork with fat trimmed off. Fish and seafood. Egg whites. Dried beans, peas, or lentils. Unsalted nuts, nut butters, and seeds. Unsalted canned beans. Lean cuts of beef with fat trimmed off. Low-sodium, lean deli meat. Dairy  Low-fat (1%) or fat-free (skim) milk. Fat-free, low-fat, or reduced-fat cheeses. Nonfat, low-sodium ricotta or cottage cheese. Low-fat or nonfat yogurt. Low-fat, low-sodium cheese. Fats and oils  Soft margarine without trans fats. Vegetable oil. Low-fat, reduced-fat, or light mayonnaise and salad dressings (reduced-sodium). Canola, safflower, olive, soybean, and sunflower oils. Avocado. Seasoning and other foods  Herbs. Spices. Seasoning mixes without salt. Unsalted popcorn and pretzels. Fat-free sweets. What foods are not recommended? The items listed may not be a complete list. Talk with your dietitian about what dietary choices are best for you. Grains  Baked goods made with fat, such as croissants, muffins, or some breads. Dry pasta or rice meal packs. Vegetables  Creamed or fried vegetables. Vegetables in a cheese sauce. Regular canned vegetables (not low-sodium or reduced-sodium). Regular canned tomato sauce and paste (not low-sodium or reduced-sodium). Regular tomato and vegetable juice (not low-sodium or reduced-sodium). Angie Fava. Olives. Fruits  Canned fruit in a light or heavy syrup. Fried fruit. Fruit in cream or butter sauce. Meat and other  protein foods  Fatty cuts of meat. Ribs. Fried meat. Berniece Salines. Sausage. Bologna and other processed lunch meats. Salami. Fatback. Hotdogs. Bratwurst. Salted nuts and seeds. Canned beans with added salt. Canned or smoked fish. Whole eggs or egg yolks. Chicken or [place] with skin. Dairy  Whole or 2% milk, cream, and half-and-half. Whole or full-fat cream cheese. Whole-fat or sweetened yogurt. Full-fat cheese. Nondairy creamers. Whipped toppings. Processed cheese and cheese spreads. Fats and oils  Butter. Stick margarine. Lard. Shortening. Ghee. Bacon fat. Tropical oils, such as coconut, palm kernel, or palm oil. Seasoning and other foods  Salted popcorn and pretzels. Onion salt, garlic salt, seasoned salt, table salt, and sea salt. Worcestershire sauce. Tartar sauce. Barbecue sauce. Teriyaki sauce. Soy sauce, including reduced-sodium. Steak sauce. Canned and packaged gravies. Fish sauce. Oyster sauce. Cocktail sauce. Horseradish that you find on the shelf. Ketchup. Mustard. Meat flavorings and tenderizers. Bouillon cubes. Hot sauce and Tabasco sauce. Premade or packaged marinades. Premade or packaged taco seasonings. Relishes. Regular salad dressings. Where to find more information:  National Heart, Lung, and [place]: https://wilson-eaton.com/  American Heart Association: www.heart.org Summary  The DASH eating plan is a healthy  eating plan that has been shown to reduce high blood pressure (hypertension). It may also reduce your risk for type 2 diabetes, heart disease, and stroke.  With the DASH eating plan, you should limit salt (sodium) intake to 2,300 mg a day. If you have hypertension, you may need to reduce your sodium intake to 1,500 mg a day.  When on the DASH eating plan, aim to eat more fresh fruits and vegetables, whole grains, lean proteins, low-fat dairy, and heart-healthy fats.  Work with your health care provider or diet and nutrition specialist (dietitian) to adjust your eating plan to  your individual calorie needs. This information is not intended to replace advice given to you by your health care provider. Make sure you discuss any questions you have with your health care provider. Document Released: 08/01/2011 Document Revised: 08/05/2016 Document Reviewed: 08/05/2016 Elsevier Interactive Patient Education  2017 [employer].

## 2017-01-10 NOTE — Assessment & Plan Note (Signed)
BP previously at goal and in the past. Suspect missed doses may be contributing. Advised to move diovan to morning for better chance to remember dosing and have asked her husband to help her remember. Checking CMP, troponin, CBC for signs of urgency. Otherwise without focal changes. EKG done in the office stable from prior.

## 2017-01-10 NOTE — Progress Notes (Signed)
Subjective:    Patient ID: Mary Acosta, female    DOB: [birth year], 61 y.o.   MRN: [id number]  HPI The patient is a 61 YO female coming in for high blood pressure at home and at her pain management visit. She is not sure if she is taking her blood pressure medicine diovan but her husband nods his head that she is not taking regularly. She is a poor historian and it is hard to keep her on track. She denies nausea or vomiting new or different. Some discomfort in her chest but with her chronic pain she is not convinced that this is new. She is not having headache now. She does not feel well but it is unclear if this is usual or different than usual. She is not able to describe clearly. She is some sedated during the visit and she does have 2 different pain medications. She denies new medications over the counter. She denies change to her meds. Her neurologist was trying to change her medications but she has not made that change. She is having more anxiety but she is not seeing psych anymore due to not wanting to try any meds that they advised as she feels that she already taking too many medicines. She feels that this is impacting her blood pressure. Her diet is extremely poor and she is drinking Dr. Malachi Bonds during the visit. She does not do low sodium diet at all.   PMH, [facility], social history reviewed and updated.   Review of Systems  Constitutional: Positive for activity change, appetite change and fatigue. Negative for chills, fever and unexpected weight change.  HENT: Negative.   Eyes: Negative.   Respiratory: Positive for shortness of breath. Negative for cough and chest tightness.        Stable  Cardiovascular: Negative for chest pain, palpitations and leg swelling.       Some discomfort  Gastrointestinal: Positive for abdominal pain and nausea. Negative for abdominal distention, constipation, diarrhea and vomiting.       Chronic, stable  Musculoskeletal: Positive for arthralgias, back  pain and myalgias. Negative for gait problem and joint swelling.  Skin: Negative.   Neurological: Positive for weakness, light-headedness and headaches. Negative for dizziness, tremors, seizures and numbness.  Hematological: Negative.   Psychiatric/Behavioral: Positive for decreased concentration, dysphoric mood and sleep disturbance. Negative for self-injury and suicidal ideas. The patient is nervous/anxious.       Objective:   Physical Exam  Constitutional: She is oriented to person, place, and time. She appears well-developed and well-nourished.  Obese  HENT:  Head: Normocephalic and atraumatic.  Eyes: EOM are normal.  Neck: Normal range of motion. No JVD present.  Cardiovascular: Normal rate and regular rhythm.   Pulmonary/Chest: Effort normal and breath sounds normal. No respiratory distress. She has no wheezes.  SOB with 3 breaths although able to complete  Abdominal: Soft. Bowel sounds are normal. She exhibits no distension and no mass. There is no tenderness. There is no rebound and no guarding.  Musculoskeletal: She exhibits tenderness. She exhibits no edema.  Lymphadenopathy:    She has no cervical adenopathy.  Neurological: She is alert and oriented to person, place, and time. No cranial nerve deficit.  Skin: Skin is warm and dry.  Psychiatric:  Extremely poor historian and sedated during visit, not able to keep focus on topic   Vitals:   01/10/17 1108 01/10/17 1156  BP: (!) 170/98 (!) 168/100  Pulse: 99   Resp: 14  Temp: 98.1 F (36.7 C)   TempSrc: Oral   SpO2: 97%   Weight: 291 lb (132 kg)   Height: 5\' 9"  (1.753 m)    EKG: Rate 81, axis stable, intervals appropriate, RBBB which is not new, no st or t wave changes from prior. When compared to 08/2016 no significant change appreciated.     Assessment & Plan:

## 2017-01-10 NOTE — Assessment & Plan Note (Signed)
She is asking about meds for anxiety but not appropriate to address today. She has not tried the medication that her PCP has prescribed and also has not followed advice of psychiatry. Would not recommend any sedating agents given her poor level of consciousness on exam with pain medication.

## 2017-01-10 NOTE — Assessment & Plan Note (Signed)
She is poor historian on exam and suspect polypharmacy is the culprit. She was supposed to cut back on elavil to help with sedation but she did not make this change.

## 2017-01-17 DIAGNOSIS — J449 Chronic obstructive pulmonary disease, unspecified: Secondary | ICD-10-CM | POA: Diagnosis not present

## 2017-01-21 ENCOUNTER — Encounter: Payer: Self-pay | Admitting: Internal Medicine

## 2017-01-22 ENCOUNTER — Other Ambulatory Visit (INDEPENDENT_AMBULATORY_CARE_PROVIDER_SITE_OTHER): Payer: Medicare HMO

## 2017-01-22 ENCOUNTER — Encounter: Payer: Self-pay | Admitting: Internal Medicine

## 2017-01-22 ENCOUNTER — Ambulatory Visit (INDEPENDENT_AMBULATORY_CARE_PROVIDER_SITE_OTHER): Payer: Medicare HMO | Admitting: Internal Medicine

## 2017-01-22 VITALS — BP 174/102 | HR 96 | Temp 98.6°F | Resp 18 | Wt 298.0 lb

## 2017-01-22 DIAGNOSIS — E038 Other specified hypothyroidism: Secondary | ICD-10-CM

## 2017-01-22 DIAGNOSIS — I10 Essential (primary) hypertension: Secondary | ICD-10-CM

## 2017-01-22 LAB — TSH: TSH: 1.43 u[IU]/mL (ref 0.35–4.50)

## 2017-01-22 LAB — COMPREHENSIVE METABOLIC PANEL
ALT: 21 U/L (ref 0–35)
AST: 28 U/L (ref 0–37)
Albumin: 4.1 g/dL (ref 3.5–5.2)
Alkaline Phosphatase: 122 U/L — ABNORMAL HIGH (ref 39–117)
BILIRUBIN TOTAL: 0.6 mg/dL (ref 0.2–1.2)
BUN: 9 mg/dL (ref 6–23)
CALCIUM: 9.4 mg/dL (ref 8.4–10.5)
CHLORIDE: 97 meq/L (ref 96–112)
CO2: 33 meq/L — AB (ref 19–32)
CREATININE: 0.89 mg/dL (ref 0.40–1.20)
GFR: 68.62 mL/min (ref >60.00)
GLUCOSE: 287 mg/dL — AB (ref 70–99)
Potassium: 4 mEq/L (ref 3.5–5.1)
SODIUM: 135 meq/L (ref 135–145)
Total Protein: 7.5 g/dL (ref 6.0–8.3)

## 2017-01-22 MED ORDER — HYDRALAZINE HCL 25 MG PO TABS: 25.0000 mg | ORAL_TABLET | Freq: Three times a day (TID) | ORAL | 5 refills | Status: DC

## 2017-01-22 MED ORDER — HYDROCHLOROTHIAZIDE 25 MG PO TABS: 25.0000 mg | ORAL_TABLET | Freq: Every day | ORAL | 5 refills | Status: DC

## 2017-01-22 NOTE — Assessment & Plan Note (Signed)
Check tsh  Titrate med dose if needed  

## 2017-01-22 NOTE — Patient Instructions (Addendum)
  Test(s) ordered today. Your results will be released to [place] (or called to you) after review, usually within 72hours after test completion. If any changes need to be made, you will be notified at that same time.  Medications reviewed and updated.  Changes include starting hydrochlorothiazide 25 mg daily and hydralazine 25 mg three times a day.   Your prescription(s) have been submitted to your pharmacy. Please take as directed and contact our office if you believe you are having problem(s) with the medication(s).  Update me via mychart next week.   Please followup in 1 month

## 2017-01-22 NOTE — Assessment & Plan Note (Signed)
Not controlled Continue diovan Start hctz 25 mg daily Hydralazine 25 mg three times a day Monitor BP at home and update me next week cmp

## 2017-01-22 NOTE — Progress Notes (Signed)
Subjective:    Patient ID: Mary Acosta, female    DOB: [birth year], 61 y.o.   MRN: [id number]  HPI She is here for an acute visit for elevated blood pressure.  Hypertension: She is taking her medication daily. She is not always compliant with a low sodium diet.  She has had some palpitations, shortness of breath and tightness in her chest.  She denies headaches, but has a strange feeling over the top of her head.  Sometimes she is has blurry vision.  She has increased edema in her leg and she feels like her hands are slightly swollen. She is none exercising regularly.  She does monitor her blood pressure at home - 196/136, 158/105, 169/102, 143/109, 166/104, 153/110, 157/114, 158/100, 193/135.  It was high at the pain clinic when she was there last - 200/100 ish.       Medications and allergies reviewed with patient and updated if appropriate.  Patient Active Problem List   Diagnosis Date Noted  . Muscle pain 12/10/2016  . Gastroparesis   . Memory difficulty 07/25/2016  . Pain in toe 04/09/2016  . Lump in neck 01/29/2016  . Chronic diastolic (congestive) heart failure ([place]) 01/03/2016  . Fatty liver 01/03/2016  . Type 1 diabetes mellitus ([place]) 01/03/2016  . Recurrent Clostridium difficile diarrhea 01/03/2016  . Cirrhosis of liver without ascites ([place]) 12/07/2015  . Chronic respiratory failure ([place]) 11/10/2014  . Physical deconditioning 09/26/2014  . Dyspnea 09/26/2014  . HCAP (healthcare-associated pneumonia) 08/26/2014  . COPD (chronic obstructive pulmonary disease) ([place]) 08/26/2014  . Iron deficiency anemia, unspecified  04/05/2011  . Bariatric surgery status 04/05/2011  . Left arm pain   . UNSPECIFIED VITAMIN D DEFICIENCY 10/22/2007  . LOW BACK PAIN, CHRONIC 10/22/2007  . INSOMNIA 10/22/2007  . Obesity 10/14/2007  . Chronic pain syndrome 10/14/2007  . CARPAL TUNNEL SYNDROME 10/14/2007  . PERIPHERAL NEUROPATHY 10/14/2007  . ALLERGIC RHINITIS CAUSE UNSPECIFIED  10/14/2007  . ARTHRITIS 10/14/2007  . Hypothyroid 10/14/2007  . HLD (hyperlipidemia) 09/14/2007  . Anxiety state 08/25/2007  . Depression 08/25/2007  . Essential hypertension 08/25/2007  . ASTHMA 08/25/2007  . CONSTIPATION 08/25/2007  . POLYMYALGIA RHEUMATICA 08/25/2007  . LEG EDEMA, BILATERAL 08/25/2007    Current Outpatient Prescriptions on File Prior to Visit  Medication Sig Dispense Refill  . albuterol (PROVENTIL HFA;VENTOLIN HFA) 108 (90 BASE) MCG/ACT inhaler Inhale 2 puffs into the lungs every 6 (six) hours as needed for wheezing or shortness of breath. 1 Inhaler 2  . amitriptyline (ELAVIL) 25 MG tablet Three tablets at night for 8 weeks, then take 2 tablets at night 90 tablet 3  . atorvastatin (LIPITOR) 40 MG tablet Take 1 tablet (40 mg total) by mouth daily. 90 tablet 3  . HYDROmorphone (DILAUDID) 4 MG tablet Take 4 mg by mouth every 6 (six) hours as needed for moderate pain.   0  . hydrOXYzine (ATARAX/VISTARIL) 25 MG tablet Take 25 mg by mouth 2 (two) times daily.     . insulin NPH Human (NOVOLIN N) 100 UNIT/ML injection Inject 1.2 mLs (120 Units total) into the skin every morning. 40 mL 11  . Insulin Syringe-Needle U-100 (INSULIN SYRINGE 1CC/30GX5/16") 30G X 5/16" 1 ML MISC Use as directed for insulin injections 5 times daily 150 each 1  . ipratropium-albuterol (DUONEB) 0.5-2.5 (3) MG/3ML SOLN Take 3 mLs by nebulization 4 (four) times daily. (Patient taking differently: Take 3 mLs by nebulization every 6 (six) hours as needed. ) 360 mL 11  .  levETIRAcetam (KEPPRA) 500 MG tablet 1/2 tablet twice a day for 2 weeks, then take one tablet twice a day 60 tablet 3  . levothyroxine (SYNTHROID, LEVOTHROID) 175 MCG tablet Take 1 tablet (175 mcg total) by mouth daily before breakfast. 90 tablet 1  . meloxicam (MOBIC) 7.5 MG tablet Take 7.5 mg by mouth 2 (two) times daily.   5  . metoCLOPramide (REGLAN) 5 MG tablet Take 1 tablet (5 mg total) by mouth 3 (three) times daily before meals. 90  tablet 2  . morphine (MS CONTIN) 30 MG 12 hr tablet Take 30 mg by mouth 2 (two) times daily.    . potassium chloride SA (K-DUR,KLOR-CON) 20 MEQ tablet Take 2 tablets by mouth daily as needed for cramping. 180 tablet 0  . promethazine (PHENERGAN) 25 MG tablet TAKE 1 TABLET BY MOUTH EVERY 6 HOURS AS NEEDED FOR NAUSEA OR VOMITING. 20 tablet 0  . tiZANidine (ZANAFLEX) 2 MG tablet Take 2 mg by mouth 3 (three) times daily.  2  . valsartan (DIOVAN) 160 MG tablet Take 1 tablet (160 mg total) by mouth daily. 90 tablet 3   No current facility-administered medications on file prior to visit.     Past Medical History:  Diagnosis Date  . Anxiety    with panic attacks  . Arthritis    "back; feet; hands; shoulders" (08/26/2014)  . Asthma   . Cervical cancer ([place])   . Chronic lower back pain   . Chronic narcotic use   . Chronic pain syndrome    [facility]  . Cirrhosis ([place])   . COPD (chronic obstructive pulmonary disease) ([place])   . Daily headache   . Depression   . DJD (degenerative joint disease)   . Fatty liver disease, nonalcoholic   . Fibromyalgia   . Frequency of urination   . HCAP (healthcare-associated pneumonia) 08/26/2014  . History of TIA (transient ischemic attack) 11-01-2010   NO RESIDUAL  . Hyperlipidemia   . Hypertension   . Hypothyroidism   . IDDM (insulin dependent diabetes mellitus) ([place])   . Insomnia   . Lumbar stenosis   . Memory difficulty 07/25/2016  . Nocturia   . OSA (obstructive sleep apnea)    NO CPAP SINCE WT LOSS  . Osteoarthritis    with severe disease in knee  . Pneumonia "several times"  . Polymyalgia rheumatica ([place])   . Scoliosis   . Seasonal allergies   . Thyroid cancer ([place])   . Urgency of urination   . Vaginal pain S/P SLING  FEB 2012    Past Surgical History:  Procedure Laterality Date  . APPENDECTOMY  1982  . BREAST LUMPECTOMY Left 02-28-2005   ATYPICAL DUCTAL HYPERPLASIA  . CARDIAC CATHETERIZATION  09-04-2004   NORMAL  CORONARY ANATOMY/ NORMAL LVF/ EF 60%  . CARDIOVASCULAR STRESS TEST  12-27-2010  DR [place]   ABNORMAL NUCLEAR STUDY W/ /MILD INFERIOR ISCHEMIA/ EF 69%/  CT HEART ANGIOGRAM ;  NO ACUTE FINDINGS  . CRYOABLATION  05/16/2003   w/LEEP FOR ABNORMAL PAP SMEAR  . CYSTOSCOPY  05/18/2012   Procedure: CYSTOSCOPY;  Surgeon: Reece Packer, MD;  Location: [facility];  Service: Urology;  Laterality: N/A;  examination under anethesia  . ESOPHAGOGASTRODUODENOSCOPY (EGD) WITH PROPOFOL N/A 09/03/2016   Procedure: ESOPHAGOGASTRODUODENOSCOPY (EGD) WITH PROPOFOL;  Surgeon: Doran Stabler, MD;  Location: [facility];  Service: Gastroenterology;  Laterality: N/A;  . HYSTEROSCOPY W/D&C  08-19-2007   PMB  . KNEE  ARTHROSCOPY W/ DEBRIDEMENT Left 03/29/2006   INTERNAL DERANGEMENT/ SEVERE DJD/ MENISCUS TEARS  . LAPAROSCOPIC CHOLECYSTECTOMY  06-10-2002  . LAPAROSCOPIC GASTRIC BANDING  03/01/2006   TRUNCAL VAGOTOMY/ PLACEMENT OF VG BAND  . REVISION TOTAL KNEE ARTHROPLASTY Left 08-29-2008; 05/2009  . TONSILLECTOMY  1969  . TOTAL KNEE ARTHROPLASTY Left 01-23-2007   SEVERE DJD  . TOTAL THYROIDECTOMY  11-22-2005   BILATERAL THYROID NODULES-- PAPILLARY CARCINOMA (0.5CM)/ ADENOMATOID NODULES  . TRANSTHORACIC ECHOCARDIOGRAM  12-27-2010   LVSF NORMAL / EF 26-37%/ GRADE I DIASTOLIC DYSFUNCTION/ MILD MITRAL REGURG. / MILDLY DILATED LEFT ATRIUM/ MILDY INCREASED SYSTOLIC PRESSURE OF PULMONARY ARTERIES  . TRANSVAGINAL SUBURETERAL TAPE/ SLING  09-28-2010   MIXED URINARY INCONTINENCE  . TUBAL LIGATION  1983    Social History   Social History  . Marital status: Married    Spouse name: N/A  . Number of children: 2  . Years of education: 12   Occupational History  . disabled Unemployed   Social History Main Topics  . Smoking status: Former Smoker    Packs/day: 2.50    Years: 39.00    Types: Cigarettes    Quit date: 10/22/2010  . Smokeless tobacco: Never Used  . Alcohol use No  . Drug use: No  .  Sexual activity: Not Currently   Other Topics Concern  . Not on file   Social History Narrative   Lives at home w/ her husband and grandson   Right-handed   Caffeine: 1 cup of coffee per week + [employer]    Family History  Problem Relation Age of Onset  . Diabetes Mother   . Heart disease Mother   . Dementia Mother   . Heart disease Father   . High blood pressure Father   . Colon cancer Maternal Uncle        x 2  . Breast cancer Other        great aunts x 5    Review of Systems  Constitutional: Negative for chills and fever.  Respiratory: Positive for shortness of breath. Negative for cough and wheezing.   Cardiovascular: Positive for chest pain, palpitations and leg swelling.  Neurological: Positive for light-headedness. Negative for dizziness and headaches.       Objective:   Vitals:   01/22/17 1304  BP: (!) 174/102  Pulse: 96  Resp: 18  Temp: 98.6 F (37 C)   Filed Weights   01/22/17 1304  Weight: 298 lb (135.2 kg)   Body mass index is 44.01 kg/m.  Wt Readings from Last 3 Encounters:  01/22/17 298 lb (135.2 kg)  01/10/17 291 lb (132 kg)  12/27/16 289 lb 8 oz (131.3 kg)     Physical Exam Constitutional: Appears well-developed and well-nourished. No distress.  HENT:  Head: Normocephalic and atraumatic.  Neck: Neck supple. No tracheal deviation present. No thyromegaly present.  No cervical lymphadenopathy Cardiovascular: Normal rate, regular rhythm and normal heart sounds.   2/6 systolic murmur heard. No carotid bruit .  1 + pitting b/l LE edema Pulmonary/Chest: Effort normal and breath sounds normal. No respiratory distress. No has no wheezes. No rales.  Skin: Skin is warm and dry. Not diaphoretic.  Psychiatric: Normal mood and affect. Behavior is normal.        Assessment & Plan:   See Problem List for Assessment and Plan of chronic medical problems.

## 2017-01-24 DIAGNOSIS — N179 Acute kidney failure, unspecified: Secondary | ICD-10-CM

## 2017-01-24 HISTORY — DX: Acute kidney failure, unspecified: N17.9

## 2017-01-28 ENCOUNTER — Ambulatory Visit: Payer: Medicare HMO | Admitting: Internal Medicine

## 2017-01-30 ENCOUNTER — Encounter: Payer: Self-pay | Admitting: Gastroenterology

## 2017-01-31 ENCOUNTER — Telehealth: Payer: Self-pay | Admitting: Gastroenterology

## 2017-01-31 ENCOUNTER — Emergency Department (HOSPITAL_COMMUNITY): Payer: Medicare HMO

## 2017-01-31 ENCOUNTER — Observation Stay (HOSPITAL_COMMUNITY)
Admission: EM | Admit: 2017-01-31 | Discharge: 2017-02-02 | Disposition: A | Discharge status: Home / Self Care | Payer: Medicare HMO | Attending: Internal Medicine | Admitting: Internal Medicine

## 2017-01-31 ENCOUNTER — Encounter (HOSPITAL_COMMUNITY): Payer: Self-pay | Admitting: Neurology

## 2017-01-31 DIAGNOSIS — N182 Chronic kidney disease, stage 2 (mild): Secondary | ICD-10-CM | POA: Insufficient documentation

## 2017-01-31 DIAGNOSIS — Z9884 Bariatric surgery status: Secondary | ICD-10-CM | POA: Diagnosis not present

## 2017-01-31 DIAGNOSIS — F1190 Opioid use, unspecified, uncomplicated: Secondary | ICD-10-CM | POA: Diagnosis present

## 2017-01-31 DIAGNOSIS — K7581 Nonalcoholic steatohepatitis (NASH): Secondary | ICD-10-CM | POA: Insufficient documentation

## 2017-01-31 DIAGNOSIS — Z9111 Patient's noncompliance with dietary regimen: Secondary | ICD-10-CM | POA: Insufficient documentation

## 2017-01-31 DIAGNOSIS — Z9981 Dependence on supplemental oxygen: Secondary | ICD-10-CM | POA: Insufficient documentation

## 2017-01-31 DIAGNOSIS — I959 Hypotension, unspecified: Secondary | ICD-10-CM | POA: Diagnosis not present

## 2017-01-31 DIAGNOSIS — N179 Acute kidney failure, unspecified: Secondary | ICD-10-CM | POA: Diagnosis present

## 2017-01-31 DIAGNOSIS — E1169 Type 2 diabetes mellitus with other specified complication: Secondary | ICD-10-CM | POA: Diagnosis present

## 2017-01-31 DIAGNOSIS — I10 Essential (primary) hypertension: Secondary | ICD-10-CM | POA: Diagnosis present

## 2017-01-31 DIAGNOSIS — IMO0002 Reserved for concepts with insufficient information to code with codable children: Secondary | ICD-10-CM

## 2017-01-31 DIAGNOSIS — E1165 Type 2 diabetes mellitus with hyperglycemia: Secondary | ICD-10-CM | POA: Insufficient documentation

## 2017-01-31 DIAGNOSIS — R0602 Shortness of breath: Secondary | ICD-10-CM | POA: Diagnosis not present

## 2017-01-31 DIAGNOSIS — Z794 Long term (current) use of insulin: Secondary | ICD-10-CM | POA: Insufficient documentation

## 2017-01-31 DIAGNOSIS — R0902 Hypoxemia: Secondary | ICD-10-CM | POA: Diagnosis not present

## 2017-01-31 DIAGNOSIS — R740 Nonspecific elevation of levels of transaminase and lactic acid dehydrogenase [LDH]: Secondary | ICD-10-CM | POA: Diagnosis not present

## 2017-01-31 DIAGNOSIS — G92 Toxic encephalopathy: Principal | ICD-10-CM | POA: Insufficient documentation

## 2017-01-31 DIAGNOSIS — E860 Dehydration: Secondary | ICD-10-CM | POA: Insufficient documentation

## 2017-01-31 DIAGNOSIS — D509 Iron deficiency anemia, unspecified: Secondary | ICD-10-CM | POA: Diagnosis not present

## 2017-01-31 DIAGNOSIS — E785 Hyperlipidemia, unspecified: Secondary | ICD-10-CM | POA: Diagnosis present

## 2017-01-31 DIAGNOSIS — R079 Chest pain, unspecified: Secondary | ICD-10-CM | POA: Diagnosis present

## 2017-01-31 DIAGNOSIS — G4733 Obstructive sleep apnea (adult) (pediatric): Secondary | ICD-10-CM | POA: Diagnosis not present

## 2017-01-31 DIAGNOSIS — E890 Postprocedural hypothyroidism: Secondary | ICD-10-CM | POA: Insufficient documentation

## 2017-01-31 DIAGNOSIS — Z8673 Personal history of transient ischemic attack (TIA), and cerebral infarction without residual deficits: Secondary | ICD-10-CM | POA: Insufficient documentation

## 2017-01-31 DIAGNOSIS — G894 Chronic pain syndrome: Secondary | ICD-10-CM | POA: Insufficient documentation

## 2017-01-31 DIAGNOSIS — R69 Illness, unspecified: Secondary | ICD-10-CM | POA: Diagnosis not present

## 2017-01-31 DIAGNOSIS — Z85850 Personal history of malignant neoplasm of thyroid: Secondary | ICD-10-CM | POA: Insufficient documentation

## 2017-01-31 DIAGNOSIS — K7460 Unspecified cirrhosis of liver: Secondary | ICD-10-CM | POA: Diagnosis not present

## 2017-01-31 DIAGNOSIS — K3184 Gastroparesis: Secondary | ICD-10-CM | POA: Insufficient documentation

## 2017-01-31 DIAGNOSIS — E119 Type 2 diabetes mellitus without complications: Secondary | ICD-10-CM | POA: Diagnosis not present

## 2017-01-31 DIAGNOSIS — I129 Hypertensive chronic kidney disease with stage 1 through stage 4 chronic kidney disease, or unspecified chronic kidney disease: Secondary | ICD-10-CM | POA: Diagnosis not present

## 2017-01-31 DIAGNOSIS — Z96652 Presence of left artificial knee joint: Secondary | ICD-10-CM | POA: Insufficient documentation

## 2017-01-31 DIAGNOSIS — E861 Hypovolemia: Secondary | ICD-10-CM | POA: Insufficient documentation

## 2017-01-31 DIAGNOSIS — Z79891 Long term (current) use of opiate analgesic: Secondary | ICD-10-CM | POA: Insufficient documentation

## 2017-01-31 DIAGNOSIS — E039 Hypothyroidism, unspecified: Secondary | ICD-10-CM | POA: Diagnosis present

## 2017-01-31 DIAGNOSIS — F411 Generalized anxiety disorder: Secondary | ICD-10-CM | POA: Diagnosis not present

## 2017-01-31 DIAGNOSIS — M797 Fibromyalgia: Secondary | ICD-10-CM | POA: Diagnosis present

## 2017-01-31 DIAGNOSIS — J449 Chronic obstructive pulmonary disease, unspecified: Secondary | ICD-10-CM | POA: Insufficient documentation

## 2017-01-31 DIAGNOSIS — E1122 Type 2 diabetes mellitus with diabetic chronic kidney disease: Secondary | ICD-10-CM | POA: Insufficient documentation

## 2017-01-31 DIAGNOSIS — E1143 Type 2 diabetes mellitus with diabetic autonomic (poly)neuropathy: Secondary | ICD-10-CM | POA: Insufficient documentation

## 2017-01-31 DIAGNOSIS — Z79899 Other long term (current) drug therapy: Secondary | ICD-10-CM | POA: Insufficient documentation

## 2017-01-31 DIAGNOSIS — E871 Hypo-osmolality and hyponatremia: Secondary | ICD-10-CM | POA: Diagnosis not present

## 2017-01-31 DIAGNOSIS — Z6841 Body Mass Index (BMI) 40.0 and over, adult: Secondary | ICD-10-CM | POA: Insufficient documentation

## 2017-01-31 DIAGNOSIS — Z791 Long term (current) use of non-steroidal anti-inflammatories (NSAID): Secondary | ICD-10-CM | POA: Insufficient documentation

## 2017-01-31 HISTORY — DX: Acute kidney failure, unspecified: N17.9

## 2017-01-31 HISTORY — DX: Other bacterial infections of unspecified site: A49.8

## 2017-01-31 LAB — I-STAT CHEM 8, ED
BUN: 45 mg/dL — AB (ref 6–20)
CALCIUM ION: 1.1 mmol/L — AB (ref 1.15–1.40)
CHLORIDE: 88 mmol/L — AB (ref 101–111)
Creatinine, Ser: 2.3 mg/dL — ABNORMAL HIGH (ref 0.44–1.00)
GLUCOSE: 475 mg/dL — AB (ref 65–99)
HCT: 44 % (ref 36.0–46.0)
Hemoglobin: 15 g/dL (ref 12.0–15.0)
Potassium: 4.3 mmol/L (ref 3.5–5.1)
Sodium: 130 mmol/L — ABNORMAL LOW (ref 135–145)
TCO2: 30 mmol/L (ref 0–100)

## 2017-01-31 LAB — I-STAT VENOUS BLOOD GAS, ED
Acid-Base Excess: 6 mmol/L — ABNORMAL HIGH (ref 0.0–2.0)
BICARBONATE: 31.6 mmol/L — AB (ref 20.0–28.0)
O2 SAT: 83 %
PCO2 VEN: 50.2 mmHg (ref 44.0–60.0)
PO2 VEN: 48 mmHg — AB (ref 32.0–45.0)
TCO2: 33 mmol/L (ref 0–100)
pH, Ven: 7.408 (ref 7.250–7.430)

## 2017-01-31 LAB — URINALYSIS, ROUTINE W REFLEX MICROSCOPIC
BILIRUBIN URINE: NEGATIVE
Bacteria, UA: NONE SEEN
Glucose, UA: >=500 mg/dL — AB
KETONES UR: NEGATIVE mg/dL
Leukocytes, UA: NEGATIVE
Nitrite: NEGATIVE
PH: 5 (ref 5.0–8.0)
Protein, ur: NEGATIVE mg/dL
Specific Gravity, Urine: 1.013 (ref 1.005–1.030)

## 2017-01-31 LAB — BASIC METABOLIC PANEL
Anion gap: 10 (ref 5–15)
BUN: 29 mg/dL — ABNORMAL HIGH (ref 6–20)
CHLORIDE: 96 mmol/L — AB (ref 101–111)
CO2: 24 mmol/L (ref 22–32)
Calcium: 8.3 mg/dL — ABNORMAL LOW (ref 8.9–10.3)
Creatinine, Ser: 1.88 mg/dL — ABNORMAL HIGH (ref 0.44–1.00)
GFR calc non Af Amer: 28 mL/min — ABNORMAL LOW (ref >60)
GFR, EST AFRICAN AMERICAN: 32 mL/min — AB (ref >60)
GLUCOSE: 437 mg/dL — AB (ref 65–99)
Potassium: 4.7 mmol/L (ref 3.5–5.1)
Sodium: 130 mmol/L — ABNORMAL LOW (ref 135–145)

## 2017-01-31 LAB — CBC
HEMATOCRIT: 40.4 % (ref 36.0–46.0)
HEMOGLOBIN: 12.9 g/dL (ref 12.0–15.0)
MCH: 24.9 pg — ABNORMAL LOW (ref 26.0–34.0)
MCHC: 31.9 g/dL (ref 30.0–36.0)
MCV: 78 fL (ref 78.0–100.0)
Platelets: 209 10*3/uL (ref 150–400)
RBC: 5.18 MIL/uL — ABNORMAL HIGH (ref 3.87–5.11)
RDW: 13.7 % (ref 11.5–15.5)
WBC: 10.8 10*3/uL — AB (ref 4.0–10.5)

## 2017-01-31 LAB — HEPATIC FUNCTION PANEL
ALBUMIN: 3.4 g/dL — AB (ref 3.5–5.0)
ALT: 31 U/L (ref 14–54)
AST: 50 U/L — AB (ref 15–41)
Alkaline Phosphatase: 105 U/L (ref 38–126)
BILIRUBIN TOTAL: 0.7 mg/dL (ref 0.3–1.2)
Bilirubin, Direct: 0.3 mg/dL (ref 0.1–0.5)
Indirect Bilirubin: 0.4 mg/dL (ref 0.3–0.9)
Total Protein: 7 g/dL (ref 6.5–8.1)

## 2017-01-31 LAB — I-STAT CG4 LACTIC ACID, ED: Lactic Acid, Venous: 2.04 mmol/L (ref 0.5–1.9)

## 2017-01-31 LAB — PROCALCITONIN: Procalcitonin: 0.11 ng/mL

## 2017-01-31 LAB — COMPREHENSIVE METABOLIC PANEL
ALT: 28 U/L (ref 14–54)
AST: 43 U/L — AB (ref 15–41)
Albumin: 3.8 g/dL (ref 3.5–5.0)
Alkaline Phosphatase: 112 U/L (ref 38–126)
Anion gap: 13 (ref 5–15)
BUN: 34 mg/dL — AB (ref 6–20)
CHLORIDE: 89 mmol/L — AB (ref 101–111)
CO2: 25 mmol/L (ref 22–32)
CREATININE: 2.33 mg/dL — AB (ref 0.44–1.00)
Calcium: 9.4 mg/dL (ref 8.9–10.3)
GFR calc Af Amer: 25 mL/min — ABNORMAL LOW (ref >60)
GFR calc non Af Amer: 22 mL/min — ABNORMAL LOW (ref >60)
Glucose, Bld: 458 mg/dL — ABNORMAL HIGH (ref 65–99)
Potassium: 4 mmol/L (ref 3.5–5.1)
SODIUM: 127 mmol/L — AB (ref 135–145)
Total Bilirubin: 0.8 mg/dL (ref 0.3–1.2)
Total Protein: 7.3 g/dL (ref 6.5–8.1)

## 2017-01-31 LAB — LACTIC ACID, PLASMA
LACTIC ACID, VENOUS: 2.5 mmol/L — AB (ref 0.5–1.9)
Lactic Acid, Venous: 3 mmol/L (ref 0.5–1.9)

## 2017-01-31 LAB — I-STAT TROPONIN, ED: Troponin i, poc: 0.01 ng/mL (ref 0.00–0.08)

## 2017-01-31 LAB — BRAIN NATRIURETIC PEPTIDE: B Natriuretic Peptide: 16.8 pg/mL (ref 0.0–100.0)

## 2017-01-31 LAB — GLUCOSE, CAPILLARY
GLUCOSE-CAPILLARY: 511 mg/dL — AB (ref 65–99)
Glucose-Capillary: 411 mg/dL — ABNORMAL HIGH (ref 65–99)

## 2017-01-31 LAB — AMMONIA: Ammonia: 33 umol/L (ref 9–35)

## 2017-01-31 LAB — CBG MONITORING, ED: Glucose-Capillary: 435 mg/dL — ABNORMAL HIGH (ref 65–99)

## 2017-01-31 LAB — LIPASE, BLOOD: Lipase: 22 U/L (ref 11–51)

## 2017-01-31 LAB — MRSA PCR SCREENING: MRSA BY PCR: NEGATIVE

## 2017-01-31 LAB — D-DIMER, QUANTITATIVE: D-Dimer, Quant: 0.43 ug/mL-FEU (ref 0.00–0.50)

## 2017-01-31 MED ORDER — ONDANSETRON HCL 4 MG/2ML IJ SOLN: 4.0000 mg | Freq: Four times a day (QID) | INTRAMUSCULAR | Status: DC | PRN

## 2017-01-31 MED ORDER — IPRATROPIUM-ALBUTEROL 0.5-2.5 (3) MG/3ML IN SOLN
3.0000 mL | Freq: Once | RESPIRATORY_TRACT | Status: AC
  Administered 2017-01-31: 3 mL via RESPIRATORY_TRACT
  Filled 2017-01-31: qty 3

## 2017-01-31 MED ORDER — LEVOTHYROXINE SODIUM 75 MCG PO TABS
175.0000 ug | ORAL_TABLET | Freq: Every day | ORAL | Status: DC
  Administered 2017-02-01 – 2017-02-02 (×2): 175 ug via ORAL
  Filled 2017-01-31 (×2): qty 1

## 2017-01-31 MED ORDER — SODIUM CHLORIDE 0.9 % IV BOLUS (SEPSIS)
1000.0000 mL | Freq: Once | INTRAVENOUS | Status: AC
  Administered 2017-01-31: 1000 mL via INTRAVENOUS

## 2017-01-31 MED ORDER — METHYLPREDNISOLONE SODIUM SUCC 125 MG IJ SOLR
125.0000 mg | Freq: Once | INTRAMUSCULAR | Status: AC
  Administered 2017-01-31: 125 mg via INTRAVENOUS
  Filled 2017-01-31: qty 2

## 2017-01-31 MED ORDER — ENOXAPARIN SODIUM 40 MG/0.4ML ~~LOC~~ SOLN
40.0000 mg | SUBCUTANEOUS | Status: DC
  Administered 2017-01-31 – 2017-02-01 (×2): 40 mg via SUBCUTANEOUS
  Filled 2017-01-31 (×2): qty 0.4

## 2017-01-31 MED ORDER — METOCLOPRAMIDE HCL 5 MG PO TABS
5.0000 mg | ORAL_TABLET | Freq: Once | ORAL | Status: AC
  Administered 2017-01-31: 5 mg via ORAL
  Filled 2017-01-31: qty 1

## 2017-01-31 MED ORDER — INSULIN NPH (HUMAN) (ISOPHANE) 100 UNIT/ML ~~LOC~~ SUSP
120.0000 [IU] | Freq: Every day | SUBCUTANEOUS | Status: DC
  Filled 2017-01-31: qty 10

## 2017-01-31 MED ORDER — SODIUM CHLORIDE 0.9 % IV BOLUS (SEPSIS)
1000.0000 mL | Freq: Once | INTRAVENOUS | Status: AC
  Administered 2017-02-01: 1000 mL via INTRAVENOUS

## 2017-01-31 MED ORDER — NOREPINEPHRINE BITARTRATE 1 MG/ML IV SOLN
0.0000 ug/min | INTRAVENOUS | Status: DC
  Administered 2017-01-31: 5 ug/min via INTRAVENOUS
  Filled 2017-01-31: qty 4

## 2017-01-31 MED ORDER — NALOXONE HCL 0.4 MG/ML IJ SOLN
0.4000 mg | Freq: Once | INTRAMUSCULAR | Status: AC
  Administered 2017-01-31: 0.4 mg via INTRAVENOUS

## 2017-01-31 MED ORDER — HEPARIN (PORCINE) IN NACL 100-0.45 UNIT/ML-% IJ SOLN
1600.0000 [IU]/h | INTRAMUSCULAR | Status: DC
  Administered 2017-01-31: 1600 [IU]/h via INTRAVENOUS
  Filled 2017-01-31: qty 250

## 2017-01-31 MED ORDER — SODIUM CHLORIDE 0.9% FLUSH
3.0000 mL | Freq: Two times a day (BID) | INTRAVENOUS | Status: DC
  Administered 2017-01-31 – 2017-02-01 (×2): 3 mL via INTRAVENOUS

## 2017-01-31 MED ORDER — INSULIN ASPART 100 UNIT/ML ~~LOC~~ SOLN
20.0000 [IU] | Freq: Once | SUBCUTANEOUS | Status: AC
  Administered 2017-01-31: 20 [IU] via SUBCUTANEOUS

## 2017-01-31 MED ORDER — AMITRIPTYLINE HCL 50 MG PO TABS
50.0000 mg | ORAL_TABLET | Freq: Every day | ORAL | Status: DC
Start: 2017-01-31 — End: 2017-02-02
  Administered 2017-01-31 – 2017-02-01 (×2): 50 mg via ORAL
  Filled 2017-01-31: qty 2
  Filled 2017-01-31: qty 1

## 2017-01-31 MED ORDER — ACETAMINOPHEN 325 MG PO TABS: 650.0000 mg | ORAL_TABLET | Freq: Four times a day (QID) | ORAL | Status: DC | PRN

## 2017-01-31 MED ORDER — SODIUM CHLORIDE 0.9 % IV SOLN
INTRAVENOUS | Status: DC
  Administered 2017-01-31 – 2017-02-01 (×3): via INTRAVENOUS

## 2017-01-31 MED ORDER — IPRATROPIUM-ALBUTEROL 0.5-2.5 (3) MG/3ML IN SOLN: 3.0000 mL | Freq: Four times a day (QID) | RESPIRATORY_TRACT | Status: DC | PRN

## 2017-01-31 MED ORDER — ORAL CARE MOUTH RINSE
15.0000 mL | Freq: Two times a day (BID) | OROMUCOSAL | Status: DC
  Administered 2017-02-01 – 2017-02-02 (×2): 15 mL via OROMUCOSAL

## 2017-01-31 MED ORDER — ALBUTEROL SULFATE HFA 108 (90 BASE) MCG/ACT IN AERS: 2.0000 | INHALATION_SPRAY | Freq: Four times a day (QID) | RESPIRATORY_TRACT | Status: DC | PRN

## 2017-01-31 MED ORDER — TIZANIDINE HCL 4 MG PO TABS
2.0000 mg | ORAL_TABLET | Freq: Every day | ORAL | Status: DC
  Administered 2017-01-31 – 2017-02-01 (×2): 2 mg via ORAL
  Filled 2017-01-31 (×2): qty 1

## 2017-01-31 MED ORDER — HYDROMORPHONE HCL 2 MG PO TABS
2.0000 mg | ORAL_TABLET | Freq: Four times a day (QID) | ORAL | Status: DC | PRN
  Administered 2017-01-31 – 2017-02-01 (×3): 2 mg via ORAL
  Filled 2017-01-31 (×3): qty 1

## 2017-01-31 MED ORDER — MORPHINE SULFATE ER 15 MG PO TBCR
15.0000 mg | EXTENDED_RELEASE_TABLET | Freq: Two times a day (BID) | ORAL | Status: DC
  Administered 2017-02-01 – 2017-02-02 (×3): 15 mg via ORAL
  Filled 2017-01-31 (×4): qty 1

## 2017-01-31 MED ORDER — SODIUM CHLORIDE 0.9 % IV BOLUS (SEPSIS)
500.0000 mL | Freq: Once | INTRAVENOUS | Status: AC
  Administered 2017-01-31: 500 mL via INTRAVENOUS

## 2017-01-31 MED ORDER — ONDANSETRON HCL 4 MG PO TABS
4.0000 mg | ORAL_TABLET | Freq: Four times a day (QID) | ORAL | Status: DC | PRN
  Administered 2017-02-01: 4 mg via ORAL
  Filled 2017-01-31: qty 1

## 2017-01-31 MED ORDER — NALOXONE HCL 0.4 MG/ML IJ SOLN
INTRAMUSCULAR | Status: AC
  Filled 2017-01-31: qty 1

## 2017-01-31 MED ORDER — ACETAMINOPHEN 650 MG RE SUPP: 650.0000 mg | Freq: Four times a day (QID) | RECTAL | Status: DC | PRN

## 2017-01-31 MED ORDER — INSULIN ASPART 100 UNIT/ML ~~LOC~~ SOLN: 0.0000 [IU] | SUBCUTANEOUS | Status: DC

## 2017-01-31 MED ORDER — HEPARIN BOLUS VIA INFUSION
5500.0000 [IU] | Freq: Once | INTRAVENOUS | Status: AC
  Administered 2017-01-31: 5500 [IU] via INTRAVENOUS
  Filled 2017-01-31: qty 5500

## 2017-01-31 NOTE — Progress Notes (Signed)
ANTICOAGULATION CONSULT NOTE - Initial Consult  Pharmacy Consult for heparin Indication: r/o PE  Allergies  Allergen Reactions  . Gabapentin Swelling    Swelling in legs  . Losartan Other (See Comments)    Myalgias and muscle cramping  . Oxycodone Itching  . Sulfa Antibiotics Nausea Only and Rash  . Sulfonamide Derivatives Nausea Only    Patient Measurements: Height: 5\' 9"  (175.3 cm) Weight: 280 lb (127 kg) IBW/kg (Calculated) : 66.2 Heparin Dosing Weight: 96kg  Vital Signs: Temp: 98.8 F (37.1 C) (06/08 1012) Temp Source: Oral (06/08 1012) BP: 78/44 (06/08 1132) Pulse Rate: 95 (06/08 1115)  Labs:  Recent Labs  01/31/17 1028 01/31/17 1037 01/31/17 1101  HGB  --  12.9 15.0  HCT  --  40.4 44.0  PLT  --  209  --   CREATININE 2.33*  --  2.30*    Estimated Creatinine Clearance: 37.2 mL/min (A) (by C-G formula based on SCr of 2.3 mg/dL (H)).   Medical History: Past Medical History:  Diagnosis Date  . Anxiety    with panic attacks  . Arthritis    "back; feet; hands; shoulders" (08/26/2014)  . Asthma   . Cervical cancer ([place])   . Chronic lower back pain   . Chronic narcotic use   . Chronic pain syndrome    [facility]  . Cirrhosis ([place])   . COPD (chronic obstructive pulmonary disease) ([place])   . Daily headache   . Depression   . DJD (degenerative joint disease)   . Fatty liver disease, nonalcoholic   . Fibromyalgia   . Frequency of urination   . HCAP (healthcare-associated pneumonia) 08/26/2014  . History of TIA (transient ischemic attack) 11-01-2010   NO RESIDUAL  . Hyperlipidemia   . Hypertension   . Hypothyroidism   . IDDM (insulin dependent diabetes mellitus) ([place])   . Insomnia   . Lumbar stenosis   . Memory difficulty 07/25/2016  . Nocturia   . OSA (obstructive sleep apnea)    NO CPAP SINCE WT LOSS  . Osteoarthritis    with severe disease in knee  . Pneumonia "several times"  . Polymyalgia rheumatica ([place])   . Scoliosis   .  Seasonal allergies   . Thyroid cancer ([place])   . Urgency of urination   . Vaginal pain S/P SLING  FEB 2012    Medications:  Infusions:  . heparin    . norepinephrine (LEVOPHED) Adult infusion Stopped (01/31/17 1214)    Assessment: 71 yof presented to the ED with CP and SOB. To start empiric heparin for possible PE. Unable to get a CT scan at this time. Baseline CBC is WNL. SHe is not on anticoagulation PTA.   Goal of Therapy:  Heparin level 0.3-0.7 units/ml Monitor platelets by anticoagulation protocol: Yes   Plan:  Heparin bolus 5500 units IV x 1 Heparin gtt 1600 units/hr Check a 6 hr heparin level Daily heparin level and CBC   Darden Flemister, Rande Lawman 01/31/2017,12:20 PM

## 2017-01-31 NOTE — Progress Notes (Addendum)
CRITICAL VALUE ALERT  Critical Value:  Lactic Acid, Venous (2.5)  Date & Time Notied:  01/31/2017 @1829   Provider Notified: Erin Hearing (NP) at [facility]  Orders Received/Actions taken: order was give to draw lactic in 4 hours

## 2017-01-31 NOTE — ED Notes (Signed)
Attempted report to floor.  

## 2017-01-31 NOTE — Telephone Encounter (Signed)
Patient sent an email message to Dr. Loletha Carrow today. I am covering and just seeing it now. She complained of nausea in her email. I was going to call the patient back and noticed she went to the ER with shortness of breath and chest pain, found to be hypotensive and is admitted.   Mary Acosta

## 2017-01-31 NOTE — Progress Notes (Signed)
CRITICAL VALUE ALERT  Critical Value:  Lactic acid 3.0  Date & Time Notied:  01/31/17 2350  Provider Notified: Hal Hope, MD  Orders Received/Actions taken: 1 L bolus
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
## 2017-01-31 NOTE — ED Provider Notes (Signed)
[place] DEPT Provider Note   CSN: [id number] Arrival date & time: 01/31/17  1005     History   Chief Complaint Chief Complaint  Patient presents with  . Chest Pain  . Shortness of Breath    HPI Mary Acosta is a 61 y.o. female.  The history is provided by the patient. No language interpreter was used.  Chest Pain   Associated symptoms include shortness of breath.  Shortness of Breath  Associated symptoms include chest pain.   Mary Acosta is a 61 y.o. female who presents to the Emergency Department complaining of SOB, CP.  She reports feeling poorly 5 days ago with right upper quadrant pain radiating to her right back. Her pain was waxing and waning in nature. Over the last few days she has had episodic central chest tightness with increased shortness of breath. Her episode 3 days ago associated diaphoresis and then improved. About 1 hour prior to arrival she developed severe chest tightness with shortness of breath. No vomiting, diarrhea. She does have severe nausea. She has chronic lower extremity edema that is improved since starting a diuretic 2 weeks ago. She recently had her blood pressure medications increased 2 weeks ago. She has no history of heart disease or blood clots. Symptoms are severe, worsening. Past Medical History:  Diagnosis Date  . Anxiety    with panic attacks  . Arthritis    "back; feet; hands; shoulders" (08/26/2014)  . Asthma   . Cervical cancer ([place])   . Chronic lower back pain   . Chronic narcotic use   . Chronic pain syndrome    [facility]  . Cirrhosis ([place])   . Clostridium difficile infection 2017  . COPD (chronic obstructive pulmonary disease) ([place])   . Daily headache   . Depression   . DJD (degenerative joint disease)   . Fatty liver disease, nonalcoholic   . Fibromyalgia   . Frequency of urination   . HCAP (healthcare-associated pneumonia) 08/26/2014  . History of TIA (transient ischemic attack)  11-01-2010   NO RESIDUAL  . Hyperlipidemia   . Hypertension   . Hypothyroidism   . IDDM (insulin dependent diabetes mellitus) ([place])   . Insomnia   . Lumbar stenosis   . Memory difficulty 07/25/2016  . Nocturia   . OSA (obstructive sleep apnea)    NO CPAP SINCE WT LOSS  . Osteoarthritis    with severe disease in knee  . Pneumonia "several times"  . Polymyalgia rheumatica ([place])   . Scoliosis   . Seasonal allergies   . Thyroid cancer ([place])   . Urgency of urination   . Vaginal pain S/P SLING  FEB 2012    Patient Active Problem List   Diagnosis Date Noted  . Acute kidney injury ([place]) 01/31/2017  . OSA (obstructive sleep apnea) 01/31/2017  . Hypertension 01/31/2017  . Liver cirrhosis secondary to NASH ([place]) 01/31/2017  . Hypothyroidism 01/31/2017  . Uncontrolled type 2 diabetes mellitus with insulin therapy ([place]) 01/31/2017  . Dehydration, moderate 01/31/2017  . Chronic narcotic use 01/31/2017  . Fibromyalgia 01/31/2017  . Hyperlipidemia 01/31/2017  . Hypotension 01/31/2017  . Muscle pain 12/10/2016  . Gastroparesis   . Memory difficulty 07/25/2016  . Pain in toe 04/09/2016  . Lump in neck 01/29/2016  . Chronic diastolic (congestive) heart failure ([place]) 01/03/2016  . Fatty liver 01/03/2016  . Type 1 diabetes mellitus ([place]) 01/03/2016  . Recurrent Clostridium difficile diarrhea 01/03/2016  . Cirrhosis  of liver without ascites ([place]) 12/07/2015  . Chronic respiratory failure ([place]) 11/10/2014  . Physical deconditioning 09/26/2014  . Dyspnea 09/26/2014  . HCAP (healthcare-associated pneumonia) 08/26/2014  . COPD (chronic obstructive pulmonary disease) ([place]) 08/26/2014  . Iron deficiency anemia, unspecified  04/05/2011  . Bariatric surgery status 04/05/2011  . Left arm pain   . UNSPECIFIED VITAMIN D DEFICIENCY 10/22/2007  . LOW BACK PAIN, CHRONIC 10/22/2007  . INSOMNIA 10/22/2007  . Obesity 10/14/2007  . Chronic pain syndrome 10/14/2007  . CARPAL TUNNEL SYNDROME  10/14/2007  . PERIPHERAL NEUROPATHY 10/14/2007  . ALLERGIC RHINITIS CAUSE UNSPECIFIED 10/14/2007  . ARTHRITIS 10/14/2007  . Hypothyroid 10/14/2007  . HLD (hyperlipidemia) 09/14/2007  . Anxiety state 08/25/2007  . Depression 08/25/2007  . Essential hypertension 08/25/2007  . ASTHMA 08/25/2007  . CONSTIPATION 08/25/2007  . POLYMYALGIA RHEUMATICA 08/25/2007  . LEG EDEMA, BILATERAL 08/25/2007    Past Surgical History:  Procedure Laterality Date  . APPENDECTOMY  1982  . BREAST LUMPECTOMY Left 02-28-2005   ATYPICAL DUCTAL HYPERPLASIA  . CARDIAC CATHETERIZATION  09-04-2004   NORMAL CORONARY ANATOMY/ NORMAL LVF/ EF 60%  . CARDIOVASCULAR STRESS TEST  12-27-2010  DR [place]   ABNORMAL NUCLEAR STUDY W/ /MILD INFERIOR ISCHEMIA/ EF 69%/  CT HEART ANGIOGRAM ;  NO ACUTE FINDINGS  . CRYOABLATION  05/16/2003   w/LEEP FOR ABNORMAL PAP SMEAR  . CYSTOSCOPY  05/18/2012   Procedure: CYSTOSCOPY;  Surgeon: Reece Packer, MD;  Location[facility];  Service: Urology;  Laterality: N/A;  examination under anethesia  . ESOPHAGOGASTRODUODENOSCOPY (EGD) WITH PROPOFOL N/A 09/03/2016   Procedure: ESOPHAGOGASTRODUODENOSCOPY (EGD) WITH PROPOFOL;  Surgeon: Doran Stabler, MD;  Location: [facility];  Service: Gastroenterology;  Laterality: N/A;  . HYSTEROSCOPY W/D&C  08-19-2007   PMB  . KNEE ARTHROSCOPY W/ DEBRIDEMENT Left 03/29/2006   INTERNAL DERANGEMENT/ SEVERE DJD/ MENISCUS TEARS  . LAPAROSCOPIC CHOLECYSTECTOMY  06-10-2002  . LAPAROSCOPIC GASTRIC BANDING  03/01/2006   TRUNCAL VAGOTOMY/ PLACEMENT OF VG BAND  . REVISION TOTAL KNEE ARTHROPLASTY Left 08-29-2008; 05/2009  . TONSILLECTOMY  1969  . TOTAL KNEE ARTHROPLASTY Left 01-23-2007   SEVERE DJD  . TOTAL THYROIDECTOMY  11-22-2005   BILATERAL THYROID NODULES-- PAPILLARY CARCINOMA (0.5CM)/ ADENOMATOID NODULES  . TRANSTHORACIC ECHOCARDIOGRAM  12-27-2010   LVSF NORMAL / EF 36-62%/ GRADE I DIASTOLIC DYSFUNCTION/ MILD MITRAL REGURG. / MILDLY  DILATED LEFT ATRIUM/ MILDY INCREASED SYSTOLIC PRESSURE OF PULMONARY ARTERIES  . TRANSVAGINAL SUBURETERAL TAPE/ SLING  09-28-2010   MIXED URINARY INCONTINENCE  . TUBAL LIGATION  1983    OB History    No data available       Home Medications    Prior to Admission medications   Medication Sig Start Date End Date Taking? Authorizing Provider  albuterol (PROVENTIL HFA;VENTOLIN HFA) 108 (90 BASE) MCG/ACT inhaler Inhale 2 puffs into the lungs every 6 (six) hours as needed for wheezing or shortness of breath. 09/11/14  Yes York, Marianne L, PA-C  amitriptyline (ELAVIL) 25 MG tablet Three tablets at night for 8 weeks, then take 2 tablets at night Patient taking differently: Take 75 mg by mouth at bedtime.  12/27/16  Yes Kathrynn Ducking, MD  atorvastatin (LIPITOR) 40 MG tablet Take 1 tablet (40 mg total) by mouth daily. 09/05/16 09/05/17 Yes Lelon Perla, MD  hydrALAZINE (APRESOLINE) 25 MG tablet Take 1 tablet (25 mg total) by mouth 3 (three) times daily. 01/22/17  Yes Burns, Claudina Lick, MD  hydrochlorothiazide (HYDRODIURIL) 25 MG tablet Take 1 tablet (  25 mg total) by mouth daily. 01/22/17  Yes Burns, Claudina Lick, MD  HYDROmorphone (DILAUDID) 4 MG tablet Take 4 mg by mouth every 6 (six) hours as needed for moderate pain.  07/04/15  Yes [provider]  insulin NPH Human (NOVOLIN N) 100 UNIT/ML injection Inject 1.2 mLs (120 Units total) into the skin every morning. 05/30/16  Yes Renato Shin, MD  Insulin Syringe-Needle U-100 (INSULIN SYRINGE 1CC/30GX5/16") 30G X 5/16" 1 ML MISC Use as directed for insulin injections 5 times daily 01/29/16  Yes Burns, Claudina Lick, MD  ipratropium-albuterol (DUONEB) 0.5-2.5 (3) MG/3ML SOLN Take 3 mLs by nebulization 4 (four) times daily. Patient taking differently: Take 3 mLs by nebulization every 6 (six) hours as needed.  10/20/14  Yes Brand Males, MD  levothyroxine (SYNTHROID, LEVOTHROID) 175 MCG tablet Take 1 tablet (175 mcg total) by mouth daily before breakfast.  12/12/16  Yes Burns, Claudina Lick, MD  Lidocaine HCl (ASPERCREME LIDOCAINE) 4 % LIQD Apply 1 application topically daily as needed (pain).   Yes [provider]  meloxicam (MOBIC) 7.5 MG tablet Take 7.5 mg by mouth 2 (two) times daily.  06/30/15  Yes [provider]  metoCLOPramide (REGLAN) 5 MG tablet Take 1 tablet (5 mg total) by mouth 3 (three) times daily before meals. 09/03/16  Yes Danis, Kirke Corin, MD  morphine (MS CONTIN) 30 MG 12 hr tablet Take 30 mg by mouth 2 (two) times daily. 09/12/15  Yes [provider]  potassium chloride SA (K-DUR,KLOR-CON) 20 MEQ tablet Take 2 tablets by mouth daily as needed for cramping. 05/02/16  Yes Burns, Claudina Lick, MD  tiZANidine (ZANAFLEX) 2 MG tablet Take 2 mg by mouth 3 (three) times daily. 05/30/15  Yes [provider]  valsartan (DIOVAN) 160 MG tablet Take 1 tablet (160 mg total) by mouth daily. 09/05/16  Yes Lelon Perla, MD  levETIRAcetam (KEPPRA) 500 MG tablet 1/2 tablet twice a day for 2 weeks, then take one tablet twice a day Patient not taking: Reported on 01/31/2017 12/27/16   Kathrynn Ducking, MD  promethazine (PHENERGAN) 25 MG tablet TAKE 1 TABLET BY MOUTH EVERY 6 HOURS AS NEEDED FOR NAUSEA OR VOMITING. Patient not taking: Reported on 01/31/2017 01/14/17   Willia Craze, NP    Family History Family History  Problem Relation Age of Onset  . Diabetes Mother   . Heart disease Mother   . Dementia Mother   . Heart disease Father   . High blood pressure Father   . Colon cancer Maternal Uncle        x 2  . Breast cancer Other        great aunts x 5    Social History Social History  Substance Use Topics  . Smoking status: Former Smoker    Packs/day: 2.50    Years: 39.00    Types: Cigarettes    Quit date: 10/22/2010  . Smokeless tobacco: Never Used  . Alcohol use No     Allergies   Gabapentin; Losartan; Oxycodone; Sulfa antibiotics; and Sulfonamide derivatives   Review of Systems Review of Systems    Respiratory: Positive for shortness of breath.   Cardiovascular: Positive for chest pain.  All other systems reviewed and are negative.    Physical Exam Updated Vital Signs BP 122/65   Pulse 91   Temp 98.8 F (37.1 C) (Oral)   Resp 14   Ht 5\' 9"  (1.753 m)   Wt 127 kg (280 lb)  SpO2 100%   BMI 41.35 kg/m   Physical Exam  Constitutional: She is oriented to person, place, and time. She appears well-developed and well-nourished. She appears distressed.  HENT:  Head: Normocephalic and atraumatic.  Cardiovascular: Normal rate and regular rhythm.   No murmur heard. Pulmonary/Chest: Effort normal. No respiratory distress.  Tachypnea with decreased air movement in all lung fields  Abdominal: Soft. There is no tenderness. There is no rebound and no guarding.  Obese abdomen with no appreciable abdominal tenderness. There is a firm, mobile mass in the right upper abdomen that patient states is from her lap band surgery.  Musculoskeletal: She exhibits no tenderness.  Trace pitting edema to bilateral lower extremities  Neurological: She is oriented to person, place, and time.  Drowsy but conversant, mildly confused.   Skin: Skin is warm and dry.  Psychiatric: She has a normal mood and affect. Her behavior is normal.  Nursing note and vitals reviewed.    ED Treatments / Results  Labs (all labs ordered are listed, but only abnormal results are displayed) Labs Reviewed  CBC - Abnormal; Notable for the following:       Result Value   WBC 10.8 (*)    RBC 5.18 (*)    MCH 24.9 (*)    All other components within normal limits  COMPREHENSIVE METABOLIC PANEL - Abnormal; Notable for the following:    Sodium 127 (*)    Chloride 89 (*)    Glucose, Bld 458 (*)    BUN 34 (*)    Creatinine, Ser 2.33 (*)    AST 43 (*)    GFR calc non Af Amer 22 (*)    GFR calc Af Amer 25 (*)    All other components within normal limits  URINALYSIS, ROUTINE W REFLEX MICROSCOPIC - Abnormal; Notable for  the following:    Glucose, UA >=500 (*)    Hgb urine dipstick SMALL (*)    Squamous Epithelial / LPF 0-5 (*)    All other components within normal limits  CBG MONITORING, ED - Abnormal; Notable for the following:    Glucose-Capillary 435 (*)    All other components within normal limits  I-STAT VENOUS BLOOD GAS, ED - Abnormal; Notable for the following:    pO2, Ven 48.0 (*)    Bicarbonate 31.6 (*)    Acid-Base Excess 6.0 (*)    All other components within normal limits  I-STAT CHEM 8, ED - Abnormal; Notable for the following:    Sodium 130 (*)    Chloride 88 (*)    BUN 45 (*)    Creatinine, Ser 2.30 (*)    Glucose, Bld 475 (*)    Calcium, Ion 1.10 (*)    All other components within normal limits  I-STAT CG4 LACTIC ACID, ED - Abnormal; Notable for the following:    Lactic Acid, Venous 2.04 (*)    All other components within normal limits  CULTURE, BLOOD (ROUTINE X 2)  CULTURE, BLOOD (ROUTINE X 2)  URINE CULTURE  LIPASE, BLOOD  BRAIN NATRIURETIC PEPTIDE  D-DIMER, QUANTITATIVE (NOT AT [facility])  AMMONIA  BASIC METABOLIC PANEL  HEPATIC FUNCTION PANEL  HEMOGLOBIN A1C  PROCALCITONIN  LACTIC ACID, PLASMA  I-STAT TROPOININ, ED    EKG  EKG Interpretation  Date/Time:  Friday January 31 2017 10:12:59 EDT Ventricular Rate:  105 PR Interval:  186 QRS Duration: 142 QT Interval:  376 QTC Calculation: 496 R Axis:   64 Text Interpretation:  Sinus tachycardia Right bundle  branch block Cannot rule out Inferior infarct , age undetermined Abnormal ECG Confirmed by Hazle Coca [phone]) on 01/31/2017 10:17:15 AM Also confirmed by Hazle Coca [phone]), editor Verna Czech [phone])  on 01/31/2017 10:56:16 AM       Radiology Dg Chest Port 1 View  Result Date: 01/31/2017 CLINICAL DATA:  Shortness of Breath EXAM: PORTABLE CHEST 1 VIEW COMPARISON:  Chest radiograph December 03, 2015 and chest CT August 05, 2016 FINDINGS: A portion of the lung bases is not imaged. Visualized lungs show no edema or  consolidation. Heart size and pulmonary vascular normal. No adenopathy. There are surgical clips at the cervical-thoracic junction region in the midline consistent with previous thyroidectomy. No pneumothorax evident. IMPRESSION: A small portion of lung bases are not visualized. Visualized lungs appear clear. Stable cardiac silhouette. No evident adenopathy. Electronically Signed   By: Lowella Grip III M.D.   On: 01/31/2017 11:37    Procedures Procedures (including critical care time) CRITICAL CARE Performed by: Quintella Reichert   Total critical care time: 45 minutes  Critical care time was exclusive of separately billable procedures and treating other patients.  Critical care was necessary to treat or prevent imminent or life-threatening deterioration.  Critical care was time spent personally by me on the following activities: development of treatment plan with patient and/or surrogate as well as nursing, discussions with consultants, evaluation of patient's response to treatment, examination of patient, obtaining history from patient or surrogate, ordering and performing treatments and interventions, ordering and review of laboratory studies, ordering and review of radiographic studies, pulse oximetry and re-evaluation of patient's condition.  Medications Ordered in ED Medications  insulin aspart (novoLOG) injection 0-9 Units (not administered)  0.9 %  sodium chloride infusion (not administered)  amitriptyline (ELAVIL) tablet 50 mg (not administered)  HYDROmorphone (DILAUDID) tablet 2 mg (not administered)  ipratropium-albuterol (DUONEB) 0.5-2.5 (3) MG/3ML nebulizer solution 3 mL (not administered)  morphine (MS CONTIN) 12 hr tablet 15 mg (not administered)  tiZANidine (ZANAFLEX) tablet 2 mg (not administered)  ipratropium-albuterol (DUONEB) 0.5-2.5 (3) MG/3ML nebulizer solution 3 mL (3 mLs Nebulization Given 01/31/17 1103)  sodium chloride 0.9 % bolus 1,000 mL (0 mLs Intravenous  Stopped 01/31/17 1221)  sodium chloride 0.9 % bolus 1,000 mL (0 mLs Intravenous Stopped 01/31/17 1232)  sodium chloride 0.9 % bolus 1,000 mL (0 mLs Intravenous Stopped 01/31/17 1307)  ipratropium-albuterol (DUONEB) 0.5-2.5 (3) MG/3ML nebulizer solution 3 mL (3 mLs Nebulization Given 01/31/17 1212)  methylPREDNISolone sodium succinate (SOLU-MEDROL) 125 mg/2 mL injection 125 mg (125 mg Intravenous Given 01/31/17 1212)  heparin bolus via infusion 5,500 Units (5,500 Units Intravenous Bolus from Bag 01/31/17 1228)  naloxone (NARCAN) injection 0.4 mg (0.4 mg Intravenous Given 01/31/17 1320)  sodium chloride 0.9 % bolus 500 mL (500 mLs Intravenous New Bag/Given 01/31/17 1453)     Initial Impression / Assessment and Plan / ED Course  I have reviewed the triage vital signs and the nursing notes.  Pertinent labs & imaging results that were available during my care of the patient were reviewed by me and considered in my medical decision making (see chart for details).     Patient here for evaluation of right upper quadrant pain, shortness of breath and chest pain. She is ill-appearing on initial evaluation with confusion, tachypnea, hypotension, hypoxia. Initial concern for possible PE, she will start on heparin drip pending d-dimer given patient is unable to get CTA. She is provided with IV fluid hydration. Patient with considerable hypotension and a KI. This is  likely due to volume depletion as well as medication. There is no evidence of sepsis on ED evaluation. Her blood pressure continued to remain low despite IV fluid resuscitation. She was started on Levophed drip with improvement in her pressure. Critical care was contacted. Her pressors were weaned and she was received a dose of Narcan in the department that resulted in additional BP improvement.  Given patient's tachypnea she was treated with solumedrol, albuterol nebulizer treatments for possible reactive airway component. She had no significant change in her  tachypnea or shortness of breath. She appeared to improve clinically with improvement in her blood pressure in terms of her mental status and interactions. Given resolution of her hypotension medicine was contacted for admission to stepdown unit for ongoing treatment.  Final Clinical Impressions(s) / ED Diagnoses   Final diagnoses:  None    New Prescriptions New Prescriptions   No medications on file     Quintella Reichert, MD 01/31/17 1524

## 2017-01-31 NOTE — Progress Notes (Signed)
01/31/2017 Patient Blood sugar was 411 at 1708. Erin Hearing was made aware at 1815. Order was given to give 20 units Novolog once. Rico Sheehan RN

## 2017-01-31 NOTE — Progress Notes (Signed)
01/31/2017 Patient transfer from the Emergency room to 3M05  At 1643.She is alert, oriented and ambulatory. Patient arrive on unit no c/o chest pain. Patient have bruises on abdomen, discoloration lower legs. Received CHG and MRSA swab. Patient was place on telemetry. [facility] RN.

## 2017-01-31 NOTE — ED Triage Notes (Signed)
Pt reports she was out shopping and developed CP and SOB. She had recent blood pressure medicine changed, unsure exactly what. C/o central cp and tightness.

## 2017-01-31 NOTE — Consult Note (Signed)
PULMONARY / CRITICAL CARE MEDICINE   Name: Mary Acosta MRN: [id number] DOB: [birth year]    ADMISSION DATE:  01/31/2017 CONSULTATION DATE:  01/31/17  REFERRING MD:  Dr. Ralene Bathe   CHIEF COMPLAINT:  Hypotension / AMS   HISTORY OF PRESENT ILLNESS:  61 y/o F, former smoker, with a complex medical hx as below who presented to [facility] on 6/8 with reports of shortness of breath and chest pain.    Initial ER evaluation notable for hypotension with systolic in the 33'A.  She was given 3L NS with modest improvement in blood pressure.  Initial CXR was negative for acute infiltrate.  VBG 7.40 / 50 / 48 / 31.  She was also noted to have desaturation into the 80's on room air (on arrival to room from triage).  4L O2 applied with improvement to 96%.  Initial labs - Na 130, K 4.3, Cl 88, glucose 475, BUN 45, Sr Cr 2.30, ammonia 33, troponin 0.01, lactic acid 2.04, WBC 10.8, Hgb 12.9 and platelets 209.  Sed rate 39.  Dimer assessed and negative.  She was initially treated with heparin for possible PE but was discontinued with negative D-Dimer.  She remained hypotensive and was started on Levophed.    PCCM consulted for evaluation.    The patient was seen in the ER on levophed 5 mcg's.  Drowsy but alert, playing candy crush on her phone.  During conversation, she would nod off, pinpoint pupils.  She was given 0.4 mg narcan with improvement in blood pressure to 149/63.  Levophed was turned off and BP remained 122/65.    PAST MEDICAL HISTORY :  She  has a past medical history of Anxiety; Arthritis; Asthma; Cervical cancer ([place]); Chronic lower back pain; Chronic narcotic use; Chronic pain syndrome; Cirrhosis (HCC); COPD (chronic obstructive pulmonary disease) ([place]); Daily headache; Depression; DJD (degenerative joint disease); Fatty liver disease, nonalcoholic; Fibromyalgia; Frequency of urination; HCAP (healthcare-associated pneumonia) (08/26/2014); History of TIA (transient ischemic attack) (11-01-2010);  Hyperlipidemia; Hypertension; Hypothyroidism; IDDM (insulin dependent diabetes mellitus) ([place]); Insomnia; Lumbar stenosis; Memory difficulty (07/25/2016); Nocturia; OSA (obstructive sleep apnea); Osteoarthritis; Pneumonia ("several times"); Polymyalgia rheumatica ([place]); Scoliosis; Seasonal allergies; Thyroid cancer ([place]); Urgency of urination; and Vaginal pain (S/P SLING  FEB 2012).  PAST SURGICAL HISTORY: She  has a past surgical history that includes Appendectomy (1982); Tubal ligation (1983); Hysteroscopy w/D&C (08-19-2007); Laparoscopic gastric banding (03/01/2006); Knee arthroscopy w/ debridement (Left, 03/29/2006); TRANSVAGINAL SUBURETERAL TAPE/ SLING (09-28-2010); Revision total knee arthroplasty (Left, 08-29-2008; 05/2009); Total knee arthroplasty (Left, 01-23-2007); Total thyroidectomy (11-22-2005); Breast lumpectomy (Left, 02-28-2005); Cardiac catheterization (09-04-2004); Laparoscopic cholecystectomy (06-10-2002); Tonsillectomy (1969); transthoracic echocardiogram (12-27-2010); Cardiovascular stress test (12-27-2010  DR [place]); Cystoscopy (05/18/2012); Cryoablation (05/16/2003); and Esophagogastroduodenoscopy (egd) with propofol (N/A, 09/03/2016).  Allergies  Allergen Reactions  . Gabapentin Swelling    Swelling in legs  . Losartan Other (See Comments)    Myalgias and muscle cramping  . Oxycodone Itching  . Sulfa Antibiotics Nausea Only and Rash  . Sulfonamide Derivatives Nausea Only    No current facility-administered medications on file prior to encounter.    Current Outpatient Prescriptions on File Prior to Encounter  Medication Sig  . albuterol (PROVENTIL HFA;VENTOLIN HFA) 108 (90 BASE) MCG/ACT inhaler Inhale 2 puffs into the lungs every 6 (six) hours as needed for wheezing or shortness of breath.  Marland Kitchen amitriptyline (ELAVIL) 25 MG tablet Three tablets at night for 8 weeks, then take 2 tablets at night (Patient taking differently: Take 75 mg by mouth at bedtime. )  .  atorvastatin  (LIPITOR) 40 MG tablet Take 1 tablet (40 mg total) by mouth daily.  . hydrALAZINE (APRESOLINE) 25 MG tablet Take 1 tablet (25 mg total) by mouth 3 (three) times daily.  . hydrochlorothiazide (HYDRODIURIL) 25 MG tablet Take 1 tablet (25 mg total) by mouth daily.  Marland Kitchen HYDROmorphone (DILAUDID) 4 MG tablet Take 4 mg by mouth every 6 (six) hours as needed for moderate pain.   Marland Kitchen insulin NPH Human (NOVOLIN N) 100 UNIT/ML injection Inject 1.2 mLs (120 Units total) into the skin every morning.  . Insulin Syringe-Needle U-100 (INSULIN SYRINGE 1CC/30GX5/16") 30G X 5/16" 1 ML MISC Use as directed for insulin injections 5 times daily  . ipratropium-albuterol (DUONEB) 0.5-2.5 (3) MG/3ML SOLN Take 3 mLs by nebulization 4 (four) times daily. (Patient taking differently: Take 3 mLs by nebulization every 6 (six) hours as needed. )  . levothyroxine (SYNTHROID, LEVOTHROID) 175 MCG tablet Take 1 tablet (175 mcg total) by mouth daily before breakfast.  . meloxicam (MOBIC) 7.5 MG tablet Take 7.5 mg by mouth 2 (two) times daily.   . metoCLOPramide (REGLAN) 5 MG tablet Take 1 tablet (5 mg total) by mouth 3 (three) times daily before meals.  Marland Kitchen morphine (MS CONTIN) 30 MG 12 hr tablet Take 30 mg by mouth 2 (two) times daily.  . potassium chloride SA (K-DUR,KLOR-CON) 20 MEQ tablet Take 2 tablets by mouth daily as needed for cramping.  Marland Kitchen tiZANidine (ZANAFLEX) 2 MG tablet Take 2 mg by mouth 3 (three) times daily.  . valsartan (DIOVAN) 160 MG tablet Take 1 tablet (160 mg total) by mouth daily.  Marland Kitchen levETIRAcetam (KEPPRA) 500 MG tablet 1/2 tablet twice a day for 2 weeks, then take one tablet twice a day (Patient not taking: Reported on 01/31/2017)  . promethazine (PHENERGAN) 25 MG tablet TAKE 1 TABLET BY MOUTH EVERY 6 HOURS AS NEEDED FOR NAUSEA OR VOMITING. (Patient not taking: Reported on 01/31/2017)    FAMILY HISTORY:  Her indicated that her mother is deceased. She indicated that her father is deceased. She indicated that only one of  her two sisters is alive. She indicated that her brother is deceased. She indicated that the status of her maternal uncle is unknown. She indicated that the status of her other is unknown.    SOCIAL HISTORY: She  reports that she quit smoking about 6 years ago. Her smoking use included Cigarettes. She has a 97.50 pack-year smoking history. She has never used smokeless tobacco. She reports that she does not drink alcohol or use drugs.  REVIEW OF SYSTEMS:  POSITIVES IN BOLD Gen: Denies fever, chills, weight change, fatigue, night sweats HEENT: Denies blurred vision, double vision, hearing loss, tinnitus, sinus congestion, rhinorrhea, sore throat, neck stiffness, dysphagia PULM: Denies shortness of breath, cough, sputum production, hemoptysis, wheezing CV: Denies chest pain, edema, orthopnea, paroxysmal nocturnal dyspnea, palpitations GI: Denies abdominal pain, nausea, vomiting, diarrhea, hematochezia, melena, constipation, change in bowel habits GU: Denies dysuria, hematuria, polyuria, oliguria, urethral discharge.  Frequency Endocrine: Denies hot or cold intolerance, polyuria, polyphagia or appetite change Derm: Denies rash, dry skin, scaling or peeling skin change Heme: Denies easy bruising, bleeding, bleeding gums Neuro: Denies headache, numbness, weakness, slurred speech, loss of memory or consciousness   SUBJECTIVE:  As above  VITAL SIGNS: BP (!) 84/44   Pulse 87   Temp 98.8 F (37.1 C) (Oral)   Resp (!) 24   Ht 5\' 9"  (1.753 m)   Wt 280 lb (127 kg)   SpO2 100%  BMI 41.35 kg/m   HEMODYNAMICS:    VENTILATOR SETTINGS:    INTAKE / OUTPUT: No intake/output data recorded.  PHYSICAL EXAMINATION: General:  Morbidly obese female in NAD, lying on stretcher  HEENT: MM pink/moist, fair dentition, [place] O2 Neuro: AAOx4, speech clear, MAE, initially drowsy > improved with Narcan  CV: s1s2 rrr, ? 2/6 murmur PULM: even/non-labored, lungs bilaterally diminished but clear  JJ:KKXF,  non-tender, bsx4 active  Extremities: warm/dry, trace BLE edema  Skin: no rashes or lesions, LE changes consistent with chronic venous stasis    LABS:  BMET  Recent Labs Lab 01/31/17 1028 01/31/17 1101  NA 127* 130*  K 4.0 4.3  CL 89* 88*  CO2 25  --   BUN 34* 45*  CREATININE 2.33* 2.30*  GLUCOSE 458* 475*    Electrolytes  Recent Labs Lab 01/31/17 1028  CALCIUM 9.4    CBC  Recent Labs Lab 01/31/17 1037 01/31/17 1101  WBC 10.8*  --   HGB 12.9 15.0  HCT 40.4 44.0  PLT 209  --     Coag's No results for input(s): APTT, INR in the last 168 hours.  Sepsis Markers  Recent Labs Lab 01/31/17 1205  LATICACIDVEN 2.04*    ABG No results for input(s): PHART, PCO2ART, PO2ART in the last 168 hours.  Liver Enzymes  Recent Labs Lab 01/31/17 1028  AST 43*  ALT 28  ALKPHOS 112  BILITOT 0.8  ALBUMIN 3.8    Cardiac Enzymes No results for input(s): TROPONINI, PROBNP in the last 168 hours.  Glucose  Recent Labs Lab 01/31/17 1052  GLUCAP 435*    Imaging Dg Chest Port 1 View  Result Date: 01/31/2017 CLINICAL DATA:  Shortness of Breath EXAM: PORTABLE CHEST 1 VIEW COMPARISON:  Chest radiograph December 03, 2015 and chest CT August 05, 2016 FINDINGS: A portion of the lung bases is not imaged. Visualized lungs show no edema or consolidation. Heart size and pulmonary vascular normal. No adenopathy. There are surgical clips at the cervical-thoracic junction region in the midline consistent with previous thyroidectomy. No pneumothorax evident. IMPRESSION: A small portion of lung bases are not visualized. Visualized lungs appear clear. Stable cardiac silhouette. No evident adenopathy. Electronically Signed   By: Lowella Grip III M.D.   On: 01/31/2017 11:37     STUDIES:  D-Dimer 6/8 >> negative   CULTURES: UA 6/8 >>  BCx2 6/8 >>   ANTIBIOTICS:   SIGNIFICANT EVENTS: 6/08  Admit with hypotension, AKI  LINES/TUBES:   DISCUSSION: 61 y/o F admitted  with hypotension, AKI  ASSESSMENT / PLAN:  PULMONARY A: Dyspnea - D-Dimer negative, CXR clear, BNP negative  Former Tobacco Abuse COPD OSA/OHS - has not been on CPAP since weight loss (she regained her weight) Hx PNA  P:   O2 as needed to support sats 90-95% Consider outpatient sleep evaluation  Pulmonary hygiene - IS, mobilize  CARDIOVASCULAR A:  Chest Pain  HTN - with recent outpatient adjustment of regimen  Hx HLD, HTN,  P:  Trend Troponin SDU admission for hemodynamic monitoring   RENAL A:   AKI - in setting of diuresis, hypotension  P:   Trend BMP / urinary output Replace electrolytes as indicated Avoid nephrotoxic agents, ensure adequate renal perfusion  GASTROINTESTINAL A:   Morbid Obesity  Fatty Liver Disease Hx Lap Band  P:   Clear liquid diet for now  HEMATOLOGIC A:   Hx Thyroid CA, Cervical Cancer P:  Monitor CBC    INFECTIOUS A:  R/O Infectious Etiology of Hypotension - doubt given resolution with narcan P:   Monitor fever curve / WBC trend  Follow cultures, urine as above   ENDOCRINE A:   DM - poorly controlled Hypothyroidism  P:   Defer mgmt to primary   NEUROLOGIC A:   Chronic Pain - followed at [facility] Polymyalgia Rheumatica  Fibromyalgia Chronic Pain / Narcotic Use - possible accidental "extra dose" P:   RASS goal: n/a Narcan 0.4 mg IVx1 Hold home elavil, dilaudid, atarax, mobic, MS contin, phenergan & zanaflex for now  Continue home Keppra  Would only use low dose PRN narcotics    FAMILY  - Updates: Patient updated on plan of care.    Global - will defer primary admission to medicine service.     CC Time:  75 minutes  Noe Gens, NP-C [facility] Pgr: [phone] or if no answer [phone] 01/31/2017, 1:14 PM   Attending Note:  I have examined patient, reviewed labs, studies and notes. I have discussed the case with B Ollis and I agree with the data and plans as amended above. 61 yo  obese woman with chronic pain on narcotics, HTN, cirrhosis. Her BP regimen was recently changed and diuretic was added. She presented to ED with dyspnea and chest discomfort. She was noted to have hypotension and lethargy. Re eived IVF resuscitation 3L with only temporary response - then hypotensive and sleepy again. We gave narcan since she is on multiple sedating meds, likely has a component OSA / OHS. On my eval both her BP and her MS have improved after narcan, IVF.  She is obese, comfortable, oriented and answering questions. No lethargy noted. Lungs distant, heart regular. Abdomen obese but benign. I believe at this point we can avoid an ICU admission. Would be careful with narcotics or any sedation. She states that she no longer uses CPAP at home. She would likely benefit from it here if she would agree to use. Please call us if we can assist w her care.  Independent critical care time is 40 minutes.   Baltazar Apo, MD, PhD 01/31/2017, 4:33 PM [facility] [phone] or if no answer [phone]

## 2017-02-01 DIAGNOSIS — N179 Acute kidney failure, unspecified: Secondary | ICD-10-CM | POA: Diagnosis not present

## 2017-02-01 LAB — COMPREHENSIVE METABOLIC PANEL
ALT: 33 U/L (ref 14–54)
ANION GAP: 9 (ref 5–15)
AST: 49 U/L — ABNORMAL HIGH (ref 15–41)
Albumin: 3.2 g/dL — ABNORMAL LOW (ref 3.5–5.0)
Alkaline Phosphatase: 93 U/L (ref 38–126)
BUN: 28 mg/dL — ABNORMAL HIGH (ref 6–20)
CHLORIDE: 99 mmol/L — AB (ref 101–111)
CO2: 23 mmol/L (ref 22–32)
Calcium: 8.1 mg/dL — ABNORMAL LOW (ref 8.9–10.3)
Creatinine, Ser: 1.54 mg/dL — ABNORMAL HIGH (ref 0.44–1.00)
GFR calc non Af Amer: 36 mL/min — ABNORMAL LOW (ref >60)
GFR, EST AFRICAN AMERICAN: 41 mL/min — AB (ref >60)
Glucose, Bld: 181 mg/dL — ABNORMAL HIGH (ref 65–99)
POTASSIUM: 4.8 mmol/L (ref 3.5–5.1)
SODIUM: 131 mmol/L — AB (ref 135–145)
Total Bilirubin: 0.6 mg/dL (ref 0.3–1.2)
Total Protein: 6.8 g/dL (ref 6.5–8.1)

## 2017-02-01 LAB — CBC
HCT: 36.6 % (ref 36.0–46.0)
Hemoglobin: 11.2 g/dL — ABNORMAL LOW (ref 12.0–15.0)
MCH: 24.1 pg — AB (ref 26.0–34.0)
MCHC: 30.6 g/dL (ref 30.0–36.0)
MCV: 78.9 fL (ref 78.0–100.0)
PLATELETS: 148 10*3/uL — AB (ref 150–400)
RBC: 4.64 MIL/uL (ref 3.87–5.11)
RDW: 14 % (ref 11.5–15.5)
WBC: 8.4 10*3/uL (ref 4.0–10.5)

## 2017-02-01 LAB — GLUCOSE, CAPILLARY
GLUCOSE-CAPILLARY: 397 mg/dL — AB (ref 65–99)
GLUCOSE-CAPILLARY: 241 mg/dL — AB (ref 65–99)
GLUCOSE-CAPILLARY: 172 mg/dL — AB (ref 65–99)
GLUCOSE-CAPILLARY: 146 mg/dL — AB (ref 65–99)
GLUCOSE-CAPILLARY: 125 mg/dL — AB (ref 65–99)
GLUCOSE-CAPILLARY: 162 mg/dL — AB (ref 65–99)
GLUCOSE-CAPILLARY: 250 mg/dL — AB (ref 65–99)
Glucose-Capillary: 139 mg/dL — ABNORMAL HIGH (ref 65–99)
Glucose-Capillary: 179 mg/dL — ABNORMAL HIGH (ref 65–99)
Glucose-Capillary: 245 mg/dL — ABNORMAL HIGH (ref 65–99)
Glucose-Capillary: 275 mg/dL — ABNORMAL HIGH (ref 65–99)
Glucose-Capillary: 317 mg/dL — ABNORMAL HIGH (ref 65–99)
Glucose-Capillary: 360 mg/dL — ABNORMAL HIGH (ref 65–99)
Glucose-Capillary: 504 mg/dL (ref 65–99)

## 2017-02-01 LAB — URINE CULTURE: Culture: NO GROWTH

## 2017-02-01 LAB — HEMOGLOBIN A1C
Hgb A1c MFr Bld: 12.6 % — ABNORMAL HIGH (ref 4.8–5.6)
Mean Plasma Glucose: 315 mg/dL

## 2017-02-01 LAB — LACTIC ACID, PLASMA
Lactic Acid, Venous: 2.4 mmol/L (ref 0.5–1.9)
Lactic Acid, Venous: 2.1 mmol/L (ref 0.5–1.9)
Lactic Acid, Venous: 2.6 mmol/L (ref 0.5–1.9)

## 2017-02-01 MED ORDER — INSULIN ASPART 100 UNIT/ML ~~LOC~~ SOLN: 0.0000 [IU] | Freq: Three times a day (TID) | SUBCUTANEOUS | Status: DC

## 2017-02-01 MED ORDER — INSULIN GLARGINE 100 UNIT/ML ~~LOC~~ SOLN
24.0000 [IU] | Freq: Two times a day (BID) | SUBCUTANEOUS | Status: DC
  Administered 2017-02-02: 24 [IU] via SUBCUTANEOUS
  Filled 2017-02-01 (×2): qty 0.24

## 2017-02-01 MED ORDER — INSULIN ASPART 100 UNIT/ML ~~LOC~~ SOLN
0.0000 [IU] | Freq: Three times a day (TID) | SUBCUTANEOUS | Status: DC
  Administered 2017-02-02: 11 [IU] via SUBCUTANEOUS

## 2017-02-01 MED ORDER — DEXTROSE 50 % IV SOLN: 25.0000 mL | INTRAVENOUS | Status: DC | PRN

## 2017-02-01 MED ORDER — SODIUM CHLORIDE 0.9 % IV SOLN: INTRAVENOUS | Status: DC

## 2017-02-01 MED ORDER — ATORVASTATIN CALCIUM 40 MG PO TABS
40.0000 mg | ORAL_TABLET | Freq: Every day | ORAL | Status: DC
  Administered 2017-02-01 – 2017-02-02 (×2): 40 mg via ORAL
  Filled 2017-02-01 (×2): qty 1

## 2017-02-01 MED ORDER — INSULIN GLARGINE 100 UNIT/ML ~~LOC~~ SOLN
30.0000 [IU] | Freq: Once | SUBCUTANEOUS | Status: AC
  Administered 2017-02-01: 30 [IU] via SUBCUTANEOUS
  Filled 2017-02-01: qty 0.3

## 2017-02-01 MED ORDER — INSULIN GLARGINE 100 UNIT/ML ~~LOC~~ SOLN
20.0000 [IU] | Freq: Once | SUBCUTANEOUS | Status: DC
  Filled 2017-02-01: qty 0.2

## 2017-02-01 MED ORDER — INSULIN REGULAR BOLUS VIA INFUSION
0.0000 [IU] | Freq: Three times a day (TID) | INTRAVENOUS | Status: DC
  Filled 2017-02-01: qty 10

## 2017-02-01 MED ORDER — SODIUM CHLORIDE 0.9 % IV SOLN
INTRAVENOUS | Status: AC
  Administered 2017-02-01: 3.4 [IU]/h via INTRAVENOUS
  Filled 2017-02-01: qty 1

## 2017-02-01 MED ORDER — INSULIN ASPART 100 UNIT/ML ~~LOC~~ SOLN
0.0000 [IU] | Freq: Every day | SUBCUTANEOUS | Status: DC
  Administered 2017-02-01: 2 [IU] via SUBCUTANEOUS

## 2017-02-01 MED ORDER — MELOXICAM 7.5 MG PO TABS
7.5000 mg | ORAL_TABLET | Freq: Two times a day (BID) | ORAL | Status: DC
  Administered 2017-02-01 – 2017-02-02 (×2): 7.5 mg via ORAL
  Filled 2017-02-01 (×3): qty 1

## 2017-02-01 MED ORDER — METOCLOPRAMIDE HCL 10 MG PO TABS
5.0000 mg | ORAL_TABLET | Freq: Three times a day (TID) | ORAL | Status: DC
  Administered 2017-02-01 – 2017-02-02 (×3): 5 mg via ORAL
  Filled 2017-02-01 (×3): qty 1

## 2017-02-01 NOTE — Progress Notes (Signed)
At 2003, CBG was 511. Hal Hope, MD was called and verbal order was given for 20 units of Novolog. At [place] CBG was 504. Again, Hal Hope was notified. Obtained order for a continuous insulin gtt and glucostabilizer. See MAR. Will continue to monitor.

## 2017-02-01 NOTE — Progress Notes (Signed)
CRITICAL VALUE ALERT  Critical Value:  Lactic Acid, Venous (2.1)  Date & Time Notied:  02/01/2017 0755  Provider Notified: Dr Thereasa Solo  Orders Received/Actions taken: none. notified via [place]

## 2017-02-01 NOTE — Progress Notes (Signed)
[place] TEAM 1 - Stepdown/ICU TEAM  Payson Crumby  EXH:[id number] DOB: 22-Jan-1956 DOA: 01/31/2017 PCP: Binnie Rail, MD    Brief Narrative:  61 y.o. female with history of HTN, morbid obesity, DM, nocturnal hypoxemia requiring oxygen, anxiety disorder, fibromyalgia, NASH cirrhosis, thyroid cancer with post-treatment hypothyroidism, and asthma who presented to the ER with chest pain and shortness of breath.  She was found to have a BP of 79/46.  Labs revealed acute kidney injury and hyperglycemia with glucose 475.   Subjective: The patient is alert oriented and conversant.  She denies any chest pain or shortness of breath.  She states she feels "a whole lot better."  Her husband is at the bedside and confirms that she has significantly improved.  Assessment & Plan:  Toxic metabolic encephalopathy - altered mental status Due to dehydration, acute kidney injury, and sedating meds - now much improved - attempt to significantly decrease narcotic doses   Acute kidney failure Baseline creatinine 0.89 - creatinine 2.33 at presentation - slowly improving with volume resuscitation - appears to be primarily prerenal azotemia  Recent Labs Lab 01/31/17 1028 01/31/17 1101 01/31/17 1655 02/01/17 0637  CREATININE 2.33* 2.30* 1.88* 1.54*    DM2 uncontrolled Much improved with use of IV insulin - wean off IV insulin drip today and follow - A1c 12.6 - was not in DKA - clearly very poorly controlled in outpt setting - titrate meds while in house   Hyponatremia  Due to hypovolemia as well as hyperglycemia - follow with volume resuscitation  Chronic narcotic use - fibromyalgia As discussed above attempt to minimize narcotic dosing  Anxiety disorder Well compensated at present  HTN Blood pressure controlled  NASH cirrhosis  Hypothyroidism Continue home Synthroid dose  HLD  Morbid obesity - Body mass index is 42.32 kg/m.   DVT prophylaxis: Lovenox Code Status: FULL  CODE Family Communication: spoke with husband at bedside   Disposition Plan:  transfer to medical bed - PT/OT evaluations - titrate diabetes medications  Consultants:  Critical Care   Procedures: None  Antimicrobials:  None  Objective: Blood pressure 108/62, pulse 82, temperature 97.8 F (36.6 C), temperature source Oral, resp. rate 11, height 5\' 9"  (1.753 m), weight 130 kg (286 lb 9.6 oz), SpO2 96 %.  Intake/Output Summary (Last 24 hours) at 02/01/17 1546 Last data filed at 02/01/17 0600  Gross per 24 hour  Intake          1001.35 ml  Output             1000 ml  Net             1.35 ml   Filed Weights   01/31/17 1011 01/31/17 1643 02/01/17 0434  Weight: 127 kg (280 lb) 130.4 kg (287 lb 8 oz) 130 kg (286 lb 9.6 oz)    Examination: General: No acute respiratory distress Lungs: Clear to auscultation bilaterally without wheezes or crackles Cardiovascular: Regular rate and rhythm without murmur gallop or rub normal S1 and S2 Abdomen: Nontender, obese, soft, bowel sounds positive, no rebound, no ascites, no appreciable mass Extremities: No significant cyanosis or clubbing - 1+ edema bilateral lower extremities  CBC:  Recent Labs Lab 01/31/17 1037 01/31/17 1101 02/01/17 0637  WBC 10.8*  --  8.4  HGB 12.9 15.0 11.2*  HCT 40.4 44.0 36.6  MCV 78.0  --  78.9  PLT 209  --  381*   Basic Metabolic Panel:  Recent Labs Lab 01/31/17 1028 01/31/17  1101 01/31/17 1655 02/01/17 0637  NA 127* 130* 130* 131*  K 4.0 4.3 4.7 4.8  CL 89* 88* 96* 99*  CO2 25  --  24 23  GLUCOSE 458* 475* 437* 181*  BUN 34* 45* 29* 28*  CREATININE 2.33* 2.30* 1.88* 1.54*  CALCIUM 9.4  --  8.3* 8.1*   GFR: Estimated Creatinine Clearance: 56.2 mL/min (A) (by C-G formula based on SCr of 1.54 mg/dL (H)).  Liver Function Tests:  Recent Labs Lab 01/31/17 1028 01/31/17 1655 02/01/17 0637  AST 43* 50* 49*  ALT 28 31 33  ALKPHOS 112 105 93  BILITOT 0.8 0.7 0.6  PROT 7.3 7.0 6.8  ALBUMIN  3.8 3.4* 3.2*    Recent Labs Lab 01/31/17 1028  LIPASE 22    Recent Labs Lab 01/31/17 1114  AMMONIA 33    HbA1C: Hgb A1c MFr Bld  Date/Time Value Ref Range Status  01/31/2017 04:55 PM 12.6 (H) 4.8 - 5.6 % Final    Comment:    (NOTE)         Pre-diabetes: 5.7 - 6.4         Diabetes: >6.4         Glycemic control for adults with diabetes: <7.0   12/10/2016 12:09 PM 13.5 (H) 4.6 - 6.5 % Final    Comment:    Glycemic Control Guidelines for People with Diabetes:Non Diabetic:  <6%Goal of Therapy: <7%Additional Action Suggested:  >8%     CBG:  Recent Labs Lab 02/01/17 0654 02/01/17 0802 02/01/17 0924 02/01/17 1034 02/01/17 1138  GLUCAP 172* 146* 125* 162* 179*    Recent Results (from the past 240 hour(s))  Culture, blood (routine x 2)     Status: None (Preliminary result)   Collection Time: 01/31/17 11:55 AM  Result Value Ref Range Status   Specimen Description BLOOD RIGHT WRIST  Final   Special Requests   Final    BOTTLES DRAWN AEROBIC AND ANAEROBIC Blood Culture adequate volume   Culture NO GROWTH 1 DAY  Final   Report Status PENDING  Incomplete  Culture, blood (routine x 2)     Status: None (Preliminary result)   Collection Time: 01/31/17 12:12 PM  Result Value Ref Range Status   Specimen Description BLOOD LEFT FOREARM  Final   Special Requests   Final    BOTTLES DRAWN AEROBIC AND ANAEROBIC Blood Culture adequate volume   Culture NO GROWTH 1 DAY  Final   Report Status PENDING  Incomplete  Culture, Urine     Status: None   Collection Time: 01/31/17  2:27 PM  Result Value Ref Range Status   Specimen Description URINE, RANDOM  Final   Special Requests NONE  Final   Culture NO GROWTH  Final   Report Status 02/01/2017 FINAL  Final  MRSA PCR Screening     Status: None   Collection Time: 01/31/17  7:20 PM  Result Value Ref Range Status   MRSA by PCR NEGATIVE NEGATIVE Final    Comment:        The GeneXpert MRSA Assay (FDA approved for NASAL  specimens only), is one component of a comprehensive MRSA colonization surveillance program. It is not intended to diagnose MRSA infection nor to guide or monitor treatment for MRSA infections.      Scheduled Meds: . amitriptyline  50 mg Oral QHS  . enoxaparin (LOVENOX) injection  40 mg Subcutaneous Q24H  . insulin NPH Human  120 Units Subcutaneous QAC breakfast  . insulin regular  0-10 Units Intravenous TID WC  . levothyroxine  175 mcg Oral QAC breakfast  . mouth rinse  15 mL Mouth Rinse BID  . morphine  15 mg Oral BID  . sodium chloride flush  3 mL Intravenous Q12H  . tiZANidine  2 mg Oral QHS     LOS: 0 days   Cherene Altes, MD [facility] Office  [phone] Pager - Text Page per Amion as per below:  On-Call/Text Page:      [email or web address]      password [email or web address]  If 7PM-7AM, please contact night-coverage [email or web address] Password [facility] 02/01/2017, 3:46 PM

## 2017-02-01 NOTE — Progress Notes (Signed)
CRITICAL VALUE ALERT  Critical Value:  Lactic Acid, Venous (2.6)  Date & Time Notied:  02/01/2017 @1231   Provider Notified: Dr Thereasa Solo  Orders Received/Actions taken: None. Notified via Amion order was given not to repeat lactic

## 2017-02-01 NOTE — Plan of Care (Signed)
Problem: Skin Integrity: Goal: Risk for impaired skin integrity will decrease Outcome: Progressing Skin assessed.  No red areas noted.  Patient is able to reposition self easily and does so frequently.  Pt verbalized understanding of reporting any skin changes to RN.  Patient progressing towards goal.

## 2017-02-02 DIAGNOSIS — I10 Essential (primary) hypertension: Secondary | ICD-10-CM | POA: Diagnosis not present

## 2017-02-02 DIAGNOSIS — E860 Dehydration: Secondary | ICD-10-CM

## 2017-02-02 DIAGNOSIS — R69 Illness, unspecified: Secondary | ICD-10-CM | POA: Diagnosis not present

## 2017-02-02 DIAGNOSIS — Z794 Long term (current) use of insulin: Secondary | ICD-10-CM

## 2017-02-02 DIAGNOSIS — F1190 Opioid use, unspecified, uncomplicated: Secondary | ICD-10-CM | POA: Diagnosis not present

## 2017-02-02 DIAGNOSIS — E1165 Type 2 diabetes mellitus with hyperglycemia: Secondary | ICD-10-CM

## 2017-02-02 DIAGNOSIS — M797 Fibromyalgia: Secondary | ICD-10-CM | POA: Diagnosis not present

## 2017-02-02 DIAGNOSIS — E039 Hypothyroidism, unspecified: Secondary | ICD-10-CM

## 2017-02-02 DIAGNOSIS — I959 Hypotension, unspecified: Secondary | ICD-10-CM | POA: Diagnosis not present

## 2017-02-02 DIAGNOSIS — N179 Acute kidney failure, unspecified: Secondary | ICD-10-CM | POA: Diagnosis not present

## 2017-02-02 DIAGNOSIS — E785 Hyperlipidemia, unspecified: Secondary | ICD-10-CM | POA: Diagnosis not present

## 2017-02-02 LAB — COMPREHENSIVE METABOLIC PANEL
ALK PHOS: 90 U/L (ref 38–126)
ALT: 26 U/L (ref 14–54)
AST: 30 U/L (ref 15–41)
Albumin: 3.2 g/dL — ABNORMAL LOW (ref 3.5–5.0)
Anion gap: 8 (ref 5–15)
BILIRUBIN TOTAL: 0.6 mg/dL (ref 0.3–1.2)
BUN: 21 mg/dL — AB (ref 6–20)
CALCIUM: 8 mg/dL — AB (ref 8.9–10.3)
CO2: 26 mmol/L (ref 22–32)
Chloride: 102 mmol/L (ref 101–111)
Creatinine, Ser: 1.19 mg/dL — ABNORMAL HIGH (ref 0.44–1.00)
GFR calc Af Amer: 56 mL/min — ABNORMAL LOW (ref >60)
GFR, EST NON AFRICAN AMERICAN: 49 mL/min — AB (ref >60)
GLUCOSE: 273 mg/dL — AB (ref 65–99)
Potassium: 4.6 mmol/L (ref 3.5–5.1)
Sodium: 136 mmol/L (ref 135–145)
TOTAL PROTEIN: 6.3 g/dL — AB (ref 6.5–8.1)

## 2017-02-02 LAB — CBC
HCT: 35.8 % — ABNORMAL LOW (ref 36.0–46.0)
Hemoglobin: 11.2 g/dL — ABNORMAL LOW (ref 12.0–15.0)
MCH: 24.5 pg — ABNORMAL LOW (ref 26.0–34.0)
MCHC: 31.3 g/dL (ref 30.0–36.0)
MCV: 78.2 fL (ref 78.0–100.0)
PLATELETS: 155 10*3/uL (ref 150–400)
RBC: 4.58 MIL/uL (ref 3.87–5.11)
RDW: 14 % (ref 11.5–15.5)
WBC: 5.7 10*3/uL (ref 4.0–10.5)

## 2017-02-02 LAB — GLUCOSE, CAPILLARY
GLUCOSE-CAPILLARY: 262 mg/dL — AB (ref 65–99)
GLUCOSE-CAPILLARY: 187 mg/dL — AB (ref 65–99)
Glucose-Capillary: 276 mg/dL — ABNORMAL HIGH (ref 65–99)

## 2017-02-02 LAB — PHOSPHORUS: Phosphorus: 2.8 mg/dL (ref 2.5–4.6)

## 2017-02-02 LAB — MAGNESIUM: MAGNESIUM: 1.5 mg/dL — AB (ref 1.7–2.4)

## 2017-02-02 MED ORDER — IPRATROPIUM-ALBUTEROL 0.5-2.5 (3) MG/3ML IN SOLN: 3.0000 mL | Freq: Four times a day (QID) | RESPIRATORY_TRACT | Status: DC | PRN

## 2017-02-02 MED ORDER — INSULIN NPH (HUMAN) (ISOPHANE) 100 UNIT/ML ~~LOC~~ SUSP: 30.0000 [IU] | Freq: Two times a day (BID) | SUBCUTANEOUS | Status: DC

## 2017-02-02 MED ORDER — AMITRIPTYLINE HCL 25 MG PO TABS: 50.0000 mg | ORAL_TABLET | Freq: Every day | ORAL | Status: DC

## 2017-02-02 MED ORDER — INSULIN ASPART 100 UNIT/ML ~~LOC~~ SOLN
0.0000 [IU] | Freq: Three times a day (TID) | SUBCUTANEOUS | Status: DC
  Administered 2017-02-02: 4 [IU] via SUBCUTANEOUS

## 2017-02-02 MED ORDER — HYDROMORPHONE HCL 4 MG PO TABS: 4.0000 mg | ORAL_TABLET | Freq: Three times a day (TID) | ORAL | Status: DC | PRN

## 2017-02-02 MED ORDER — MAGNESIUM SULFATE 2 GM/50ML IV SOLN
2.0000 g | Freq: Once | INTRAVENOUS | Status: AC
  Administered 2017-02-02: 2 g via INTRAVENOUS
  Filled 2017-02-02: qty 50

## 2017-02-02 MED ORDER — INSULIN ASPART 100 UNIT/ML ~~LOC~~ SOLN: 0.0000 [IU] | Freq: Three times a day (TID) | SUBCUTANEOUS | 0 refills | Status: DC

## 2017-02-02 NOTE — Progress Notes (Signed)
Patient slept well last pm.  No changes in assessment noted.  Patient woke this morning with c/o back pain.  Pain medication given.  No other c/o pain or discomfort voiced last pm.  All questions and concerns addressed.  Will continue to monitor and report off to day RN.

## 2017-02-02 NOTE — Evaluation (Signed)
Occupational Therapy Evaluation Patient Details Name: Mary Acosta MRN: [id number] DOB: 1956-05-28 Today's Date: 02/02/2017    History of Present Illness Pt is a 61 yo female admitted through ED on 01/31/17 with weakness and fatigue. Pt was diagnosed with AKI, had hypotension, hyperglycemia, and NASH with cirrhosis. PMH significant for HTN, obesity, IDDM, anxiety, A-fib, asthma.     Clinical Impression   PTA, pt required assistance for donning/doffing socks and for IADL at times. During OT evaluation, pt was able to demonstrate baseline ADL participation including modified independence with toilet and tub transfers. She does demonstrate decreased activity tolerance at times for ADL and educated pt concerning energy conservation strategies, home modifications, and use of AE for LB ADL to improve ability to participate in desired and required ADL. She verbalized and demonstrated understanding of all education topics. No further acute OT needs identified and pt/family report no further questions or concerns. OT will sign off.     Follow Up Recommendations  No OT follow up;Supervision - Intermittent    Equipment Recommendations  None recommended by OT    Recommendations for Other Services       Precautions / Restrictions Precautions Precautions: None Restrictions Weight Bearing Restrictions: No      Mobility Bed Mobility Overal bed mobility: Independent             General bed mobility comments: Able to get out of bed without assistance  Transfers Overall transfer level: Modified independent Equipment used: None             General transfer comment: Sitting at EOB on OT arrival.     Balance Overall balance assessment: No apparent balance deficits (not formally assessed)                                         ADL either performed or assessed with clinical judgement   ADL Overall ADL's : At baseline                                        General ADL Comments: Able to complete basic ADL with exeption of donning/doffing socks with modified independence which is her baseline. Able to complete tub transfer with modified independence. Educated pt on energy conservation strategies with handout provided as well as fall prevention, home modifications, and use of sock aide and long handled sponge for LB ADl.     Vision Patient Visual Report: No change from baseline Vision Assessment?: No apparent visual deficits     Perception     Praxis      Pertinent Vitals/Pain Pain Assessment: No/denies pain     Hand Dominance Right   Extremity/Trunk Assessment Upper Extremity Assessment Upper Extremity Assessment: Generalized weakness   Lower Extremity Assessment Lower Extremity Assessment: Generalized weakness   Cervical / Trunk Assessment Cervical / Trunk Assessment: Normal   Communication Communication Communication: No difficulties   Cognition Arousal/Alertness: Awake/alert Behavior During Therapy: WFL for tasks assessed/performed Overall Cognitive Status: Within Functional Limits for tasks assessed                                     General Comments       Exercises     Shoulder  Instructions      Home Living Family/patient expects to be discharged to:: Private residence Living Arrangements: Spouse/significant other;Other relatives (61 year old [place]) Available Help at Discharge: Family Type of Home: House Home Access: Stairs to enter [employer] of Steps: 4 Entrance Stairs-Rails: Right;Left;Can reach both Home Layout: One level     Bathroom Shower/Tub: Tub/shower unit;Walk-in shower   Bathroom Toilet: Handicapped height     Home Equipment: Environmental consultant - 2 wheels;Cane - single point;Bedside commode;Shower seat          Prior Functioning/Environment Level of Independence: Needs assistance;Independent with assistive device(s)  Gait / Transfers Assistance Needed:  Performs gait without an AD ADL's / Homemaking Assistance Needed: Husband assists with socks and IADL. Pt cooks at times but primarily simple meals.             OT Problem List: Decreased strength;Decreased activity tolerance      OT Treatment/Interventions:      OT Goals(Current goals can be found in the care plan section) Acute Rehab OT Goals Patient Stated Goal: to get home today OT Goal Formulation: With patient/family Time For Goal Achievement: 02/16/17 Potential to Achieve Goals: Good  OT Frequency:     Barriers to D/C:            Co-evaluation              AM-PAC PT "6 Clicks" Daily Activity     Outcome Measure Help from another person eating meals?: None Help from another person taking care of personal grooming?: None Help from another person toileting, which includes using toliet, bedpan, or urinal?: None Help from another person bathing (including washing, rinsing, drying)?: None Help from another person to put on and taking off regular upper body clothing?: None Help from another person to put on and taking off regular lower body clothing?: A Little 6 Click Score: 23   End of Session Nurse Communication: Mobility status  Activity Tolerance: Patient tolerated treatment well Patient left: in bed;with family/visitor present (sitting at EOB with husband present)  OT Visit Diagnosis: Unsteadiness on feet (R26.81);Muscle weakness (generalized) (M62.81)                Time: 1039-1100 OT Time Calculation (min): 21 min Charges:  OT General Charges $OT Visit: 1 Procedure OT Evaluation $OT Eval Moderate Complexity: 1 Procedure G-Codes: OT G-codes **NOT FOR INPATIENT CLASS** Functional Limitation: Self care Self Care Current Status (I9678): At least 1 percent but less than 20 percent impaired, limited or restricted Self Care Goal Status (L3810): At least 1 percent but less than 20 percent impaired, limited or restricted Self Care Discharge Status [phone]): At  least 1 percent but less than 20 percent impaired, limited or restricted   Norman Herrlich, [place] OTR/L  Pager: [place] 02/02/2017, 2:26 PM

## 2017-02-02 NOTE — Evaluation (Signed)
Physical Therapy Evaluation Patient Details Name: Mary Acosta MRN: [id number] DOB: 1956-03-17 Today's Date: 02/02/2017   History of Present Illness  Pt is a 61 yo female admitted through ED on 01/31/17 with weakness and fatigue. Pt was diagnosed with AKI, had hypotension, hyperglycemia, and NASH with cirrhosis. PMH significant for HTN, obesity, IDDM, anxiety, A-fib, asthma.    Clinical Impression  Pt presents with the above diagnosis and below deficits for therapy evaluation. Prior to admission, pt lived with her husband in a single level home. Pt was Mod I with mobility and required occasional assistance from her husband when leaving home. Pt is Mod I for all mobility this session including walking into bathroom, performing hygiene, and gait with a RW with Mod I to supervision. Pt does not require any PT follow-up at DC and will not require and additional acute PT follow-up at this time. All questions answered and PT will sign off at this time. Please re-order if any new needs arise.     Follow Up Recommendations No PT follow up    Equipment Recommendations  None recommended by PT    Recommendations for Other Services       Precautions / Restrictions Precautions Precautions: None Restrictions Weight Bearing Restrictions: No      Mobility  Bed Mobility Overal bed mobility: Independent             General bed mobility comments: Able to get out of bed without assistance  Transfers Overall transfer level: Modified independent Equipment used: None             General transfer comment: Increased time, independent  Ambulation/Gait Ambulation/Gait assistance: Modified independent (Device/Increase time) Ambulation Distance (Feet): 250 Feet Assistive device: Rolling walker (2 wheeled) Gait Pattern/deviations: Step-to pattern Gait velocity: decreased Gait velocity interpretation: Below normal speed for age/gender General Gait Details: Good sequencing and step  length, no LOB  Stairs            Wheelchair Mobility    Modified Rankin (Stroke Patients Only)       Balance Overall balance assessment: No apparent balance deficits (not formally assessed)                                           Pertinent Vitals/Pain Pain Assessment: No/denies pain    Home Living Family/patient expects to be discharged to:: Private residence Living Arrangements: Spouse/significant other;Other relatives Available Help at Discharge: Family Type of Home: House Home Access: Stairs to enter Entrance Stairs-Rails: Right;Left;Can reach both Entrance Stairs-Number of Steps: 4 Home Layout: One level Home Equipment: [place] - 2 wheels;Cane - single point;Bedside commode;Shower seat      Prior Function Level of Independence: Needs assistance;Independent with assistive device(s)   Gait / Transfers Assistance Needed: performs gait without an AD  ADL's / Homemaking Assistance Needed: husband helps with socks at times        Hand Dominance   Dominant Hand: Right    Extremity/Trunk Assessment   Upper Extremity Assessment Upper Extremity Assessment: Defer to OT evaluation    Lower Extremity Assessment Lower Extremity Assessment: Generalized weakness    Cervical / Trunk Assessment Cervical / Trunk Assessment: Normal  Communication   Communication: No difficulties  Cognition Arousal/Alertness: Awake/alert Behavior During Therapy: WFL for tasks assessed/performed Overall Cognitive Status: Within Functional Limits for tasks assessed  General Comments      Exercises     Assessment/Plan    PT Assessment Patent does not need any further PT services  PT Problem List         PT Treatment Interventions      PT Goals (Current goals can be found in the Care Plan section)  Acute Rehab PT Goals Patient Stated Goal: to get home today PT Goal Formulation: With  patient Time For Goal Achievement: 02/09/17 Potential to Achieve Goals: Good    Frequency     Barriers to discharge        Co-evaluation               AM-PAC PT "6 Clicks" Daily Activity  Outcome Measure Difficulty turning over in bed (including adjusting bedclothes, sheets and blankets)?: None Difficulty moving from lying on back to sitting on the side of the bed? : None Difficulty sitting down on and standing up from a chair with arms (e.g., wheelchair, bedside commode, etc,.)?: None Help needed moving to and from a bed to chair (including a wheelchair)?: None Help needed walking in hospital room?: None Help needed climbing 3-5 steps with a railing? : None 6 Click Score: 24    End of Session Equipment Utilized During Treatment: Gait belt Activity Tolerance: Patient tolerated treatment well Patient left: in bed;with family/visitor present Nurse Communication: Mobility status PT Visit Diagnosis: Unsteadiness on feet (R26.81)    Time: 3817-7116 PT Time Calculation (min) (ACUTE ONLY): 24 min   Charges:   PT Evaluation $PT Eval Moderate Complexity: 1 Procedure PT Treatments $Gait Training: 8-22 mins   PT G Codes:   PT G-Codes **NOT FOR INPATIENT CLASS** Functional Assessment Tool Used: AM-PAC 6 Clicks Basic Mobility;Clinical judgement Functional Limitation: Mobility: Walking and moving around Mobility: Walking and Moving Around Current Status (F7903): 0 percent impaired, limited or restricted Mobility: Walking and Moving Around Goal Status (Y3338): 0 percent impaired, limited or restricted Mobility: Walking and Moving Around Discharge Status (V2919): 0 percent impaired, limited or restricted    Scheryl Marten PT, DPT  [phone]   Shanon Rosser 02/02/2017, 10:43 AM

## 2017-02-02 NOTE — Discharge Summary (Addendum)
Physician Discharge Summary  Mary Acosta QHU:[id number] DOB: [birth year]  PCP: Binnie Rail, MD  Admit date: 01/31/2017 Discharge date: 02/02/2017  Recommendations for Outpatient Follow-up:  1. Dr. Celso Amy, PCP in 2 days with repeat labs (CBC, BMP & Mg). Also follow-up regarding diabetes management. Please follow final blood culture results that were sent from the hospital. 2. Dr. Renato Shin, Endocrinology in 1 week regarding management of poorly controlled IDDM.  Home Health: None Equipment/Devices: None    Discharge Condition: Improved and stable  CODE STATUS: Full  Diet recommendation: Heart healthy & diabetic diet.  Discharge Diagnoses:  Principal Problem:   Acute kidney injury ([place]) Active Problems:   OSA (obstructive sleep apnea)   Hypertension   Liver cirrhosis secondary to NASH (H[facility])   Hypothyroidism   Uncontrolled type 2 diabetes mellitus with insulin therapy ([facility])   Dehydration, moderate   Chronic narcotic use   Fibromyalgia   Hyperlipidemia   Hypotension   Brief Summary: 61 year old married female, PMH of poorly controlled type II DM/IDDM (Dr. Renato Shin, Endocrinology), HTN, morbid obesity status post lap band (Dr. Johnathan Hausen, Bariatric surgery), nocturnal hypoxemia on nightly oxygen at 3 L/m, fibromyalgia/chronic pain ([facility] anesthesia and pain management), NASH Cirrhosis, anxiety disorder, thyroid cancer with post treatment hypothyroid and asthma presented to ED with complaints of dyspnea and chest tightness but denied chest pain. She was found to be hypotensive with BP 79/46, acute kidney injury than hyperglycemia with blood sugars of 475. Initially admitted to stepdown unit.  Assessment and plan:  1. Confusion/toxic metabolic encephalopathy: Likely secondary to dehydration, acute kidney injury while on home sedating medications and decreased clearance. Resolved. 2. Acute kidney injury complicating stage II chronic kidney disease: Creatinine  on 01/22/17:0.89. Presented with creatinine of 2.33. Likely secondary to dehydration, ARB, HCTZ,? ATN due to hypotension. Culprit medications were held. Briefly hydrated with IV fluids. Creatinine has improved to 1.19. Continue to hold ARB and HCTZ at discharge. Follow BMP in a couple days as outpatient. 3. Poorly controlled type II DM/IDDM: Patient claims compliance with NPH insulin 120 units daily but noncompliant with diabetic diet or home CBG checks. A1c 12.6. Was not in DKA. She was placed on Lantus and SSI in the hospital. CBGs mostly in the mid 200s. Based on response noted with reduced dose of long-acting insulin, not sure if she is fully compliant with her NPH insulin either. Discussed with case management who advised that her insurance will not cover Lantus. Hence we will transition her home NPH insulin to 30 units twice a day, add NovoLog SSI. Strongly recommended that she follow-up with her PCP and endocrinologist for better management of diabetes. Counseled extensively regarding compliance with all aspects of diabetes care and she verbalizes understanding. 4. Hyponatremia: Secondary to hypovolemia, hyperglycemia and HCTZ. Resolved. 5. Chronic pain/fibromyalgia: As discussed above, follows with outpatient pain management and has been on current MS Contin regimen for couple of years without recent change. Reviewed pain management notes from May 2018 in care everywhere and patient is supposed to be on Dilaudid 4 MG every 8 hourly when necessary and not every 6 hourly when necessary. This was changed at discharge & no new prescriptions were provided. Amitriptyline was also reduced to 50 mg at bedtime. Continue home regimen and close outpatient follow-up area 6. Anxiety disorder: Stable. 7. Essential hypertension: Presented with hypotension which has since resolved. Blood pressures starting to climb. Resume hydralazine at discharge but continue to hold ARB and HCTZ due to recent acute  kidney injury.   8. NASH cirrhosis: Outpatient follow-up. 9. Hypothyroid: Likely euthyroid. Continue Synthroid.  10. Hyperlipidemia  11. Morbid obesity/Body mass index is 42.32 kg/m. 12. Nocturnal hypoxemia: Continue prior home oxygen. 13. Anemia: Stable. Outpatient follow-up. 14. Hypomagnesemia: Replaced prior to discharge. 15. Elevated lactate: Likely secondary to dehydration, hypotension, poor tissue perfusion and acute kidney injury. Hydrated and managed as above. Asymptomatic.   Consultations:  None  Procedures:  None   Discharge Instructions  Discharge Instructions    Call MD for:    Complete by:  As directed    Confusion or altered mental status.   Call MD for:  extreme fatigue    Complete by:  As directed    Call MD for:  persistant dizziness or light-headedness    Complete by:  As directed    Call MD for:  severe uncontrolled pain    Complete by:  As directed    Diet - low sodium heart healthy    Complete by:  As directed    Diet Carb Modified    Complete by:  As directed    Increase activity slowly    Complete by:  As directed        Medication List    STOP taking these medications   hydrochlorothiazide 25 MG tablet Commonly known as:  HYDRODIURIL   levETIRAcetam 500 MG tablet Commonly known as:  KEPPRA   potassium chloride SA 20 MEQ tablet Commonly known as:  K-DUR,KLOR-CON   promethazine 25 MG tablet Commonly known as:  PHENERGAN   valsartan 160 MG tablet Commonly known as:  DIOVAN     TAKE these medications   albuterol 108 (90 Base) MCG/ACT inhaler Commonly known as:  PROVENTIL HFA;VENTOLIN HFA Inhale 2 puffs into the lungs every 6 (six) hours as needed for wheezing or shortness of breath.   amitriptyline 25 MG tablet Commonly known as:  ELAVIL Take 2 tablets (50 mg total) by mouth at bedtime. What changed:  how much to take  how to take this  when to take this  additional instructions   ASPERCREME LIDOCAINE 4 % Liqd Generic drug:   Lidocaine HCl Apply 1 application topically daily as needed (pain).   atorvastatin 40 MG tablet Commonly known as:  LIPITOR Take 1 tablet (40 mg total) by mouth daily.   hydrALAZINE 25 MG tablet Commonly known as:  APRESOLINE Take 1 tablet (25 mg total) by mouth 3 (three) times daily.   HYDROmorphone 4 MG tablet Commonly known as:  DILAUDID Take 1 tablet (4 mg total) by mouth every 8 (eight) hours as needed for moderate pain. What changed:  when to take this   insulin aspart 100 UNIT/ML injection Commonly known as:  novoLOG Inject 0-20 Units into the skin 3 (three) times daily with meals. CBG < 70: Eat or drink something sweet and recheck; CBG 70 - 120: 0 units;CBG 121 - 150: 3 units;CBG 151 - 200: 4 units;CBG 201 - 250: 7 units;CBG 251 - 300: 11 units;CBG 301 - 350: 15 units;CBG 351 - 400: 20 units;CBG > 400: call MD.   insulin NPH Human 100 UNIT/ML injection Commonly known as:  NOVOLIN N Inject 0.3 mLs (30 Units total) into the skin 2 (two) times daily before a meal. What changed:  how much to take  when to take this   INSULIN SYRINGE 1CC/30GX5/16" 30G X 5/16" 1 ML Misc Use as directed for insulin injections 5 times daily   ipratropium-albuterol 0.5-2.5 (3) MG/3ML Soln Commonly known  as:  DUONEB Take 3 mLs by nebulization every 6 (six) hours as needed (Wheezing or dyspnea.). What changed:  when to take this  reasons to take this   levothyroxine 175 MCG tablet Commonly known as:  SYNTHROID, LEVOTHROID Take 1 tablet (175 mcg total) by mouth daily before breakfast.   meloxicam 7.5 MG tablet Commonly known as:  MOBIC Take 7.5 mg by mouth 2 (two) times daily.   metoCLOPramide 5 MG tablet Commonly known as:  REGLAN Take 1 tablet (5 mg total) by mouth 3 (three) times daily before meals.   morphine 30 MG 12 hr tablet Commonly known as:  MS CONTIN Take 30 mg by mouth 2 (two) times daily.   tiZANidine 2 MG tablet Commonly known as:  ZANAFLEX Take 2 mg by mouth 3  (three) times daily.      Follow-up Information    Binnie Rail, MD. Schedule an appointment as soon as possible for a visit in 2 day(s).   Specialty:  Internal Medicine Why:  To be seen with repeat labs (CBC & BMP). Contact information: [place] [address] [phone]        Renato Shin, MD. Schedule an appointment as soon as possible for a visit in 1 week(s).   Specialty:  Endocrinology Why:  Follow-up regarding management of poorly controlled diabetes. Contact information: [address] [place] [address] [phone]          Allergies  Allergen Reactions  . Gabapentin Swelling    Swelling in legs  . Losartan Other (See Comments)    Myalgias and muscle cramping  . Oxycodone Itching  . Sulfa Antibiotics Nausea Only and Rash  . Sulfonamide Derivatives Nausea Only      Procedures/Studies: Dg Chest Port 1 View  Result Date: 01/31/2017 CLINICAL DATA:  Shortness of Breath EXAM: PORTABLE CHEST 1 VIEW COMPARISON:  Chest radiograph December 03, 2015 and chest CT August 05, 2016 FINDINGS: A portion of the lung bases is not imaged. Visualized lungs show no edema or consolidation. Heart size and pulmonary vascular normal. No adenopathy. There are surgical clips at the cervical-thoracic junction region in the midline consistent with previous thyroidectomy. No pneumothorax evident. IMPRESSION: A small portion of lung bases are not visualized. Visualized lungs appear clear. Stable cardiac silhouette. No evident adenopathy. Electronically Signed   By: Lowella Grip III M.D.   On: 01/31/2017 11:37      Subjective: Seen this morning. Spouse at bedside. States that she feels much better. Denies dyspnea and indicates that her breathing is back to baseline. States that she had chest tightness and not chest pain on admission and has not had recurrence of same. States that her pain is better controlled. Reports that she has been on current pain  regimen for a couple of years without recent change and has had no issues with sedation or confusion at home. This was confirmed by spouse. Denies any other complaints.   Discharge Exam:  Vitals:   02/01/17 0434 02/01/17 1954 02/01/17 2218 02/02/17 0400  BP:   (!) 173/79 (!) 143/79  Pulse:   87 79  Resp:   16 18  Temp:  98.1 F (36.7 C) 98.1 F (36.7 C) 98 F (36.7 C)  TempSrc:  Oral Oral Oral  SpO2:   100% 97%  Weight: 130 kg (286 lb 9.6 oz)     Height:        General: Pt lying comfortably in bed & appears  in no obvious distress.Middle-aged female, moderately built and morbidly obese.  Cardiovascular: S1 & S2 heard, RRR, S1/S2 +. No murmurs, rubs, gallops or clicks. No JVD or pedal edema. not on Telemetry.  Respiratory: Clear to auscultation without wheezing, rhonchi or crackles. No increased work of breathing. Abdominal:  Non distended, non tender & soft. No organomegaly or masses appreciated. Normal bowel sounds heard. Lap band port palpable in the epigastric region.  CNS: Alert and oriented. No focal deficits. Extremities: no edema, no cyanosis    The results of significant diagnostics from this hospitalization (including imaging, microbiology, ancillary and laboratory) are listed below for reference.     Microbiology: Recent Results (from the past 240 hour(s))  Culture, blood (routine x 2)     Status: None (Preliminary result)   Collection Time: 01/31/17 11:55 AM  Result Value Ref Range Status   Specimen Description BLOOD RIGHT WRIST  Final   Special Requests   Final    BOTTLES DRAWN AEROBIC AND ANAEROBIC Blood Culture adequate volume   Culture NO GROWTH 2 DAYS  Final   Report Status PENDING  Incomplete  Culture, blood (routine x 2)     Status: None (Preliminary result)   Collection Time: 01/31/17 12:12 PM  Result Value Ref Range Status   Specimen Description BLOOD LEFT FOREARM  Final   Special Requests   Final    BOTTLES DRAWN AEROBIC AND ANAEROBIC Blood Culture  adequate volume   Culture NO GROWTH 2 DAYS  Final   Report Status PENDING  Incomplete  Culture, Urine     Status: None   Collection Time: 01/31/17  2:27 PM  Result Value Ref Range Status   Specimen Description URINE, RANDOM  Final   Special Requests NONE  Final   Culture NO GROWTH  Final   Report Status 02/01/2017 FINAL  Final  MRSA PCR Screening     Status: None   Collection Time: 01/31/17  7:20 PM  Result Value Ref Range Status   MRSA by PCR NEGATIVE NEGATIVE Final    Comment:        The GeneXpert MRSA Assay (FDA approved for NASAL specimens only), is one component of a comprehensive MRSA colonization surveillance program. It is not intended to diagnose MRSA infection nor to guide or monitor treatment for MRSA infections.      Labs: CBC:  Recent Labs Lab 01/31/17 1037 01/31/17 1101 02/01/17 0637 02/02/17 0526  WBC 10.8*  --  8.4 5.7  HGB 12.9 15.0 11.2* 11.2*  HCT 40.4 44.0 36.6 35.8*  MCV 78.0  --  78.9 78.2  PLT 209  --  148* 759   Basic Metabolic Panel:  Recent Labs Lab 01/31/17 1028 01/31/17 1101 01/31/17 1655 02/01/17 0637 02/02/17 0526  NA 127* 130* 130* 131* 136  K 4.0 4.3 4.7 4.8 4.6  CL 89* 88* 96* 99* 102  CO2 25  --  24 23 26   GLUCOSE 458* 475* 437* 181* 273*  BUN 34* 45* 29* 28* 21*  CREATININE 2.33* 2.30* 1.88* 1.54* 1.19*  CALCIUM 9.4  --  8.3* 8.1* 8.0*  MG  --   --   --   --  1.5*  PHOS  --   --   --   --  2.8   Liver Function Tests:  Recent Labs Lab 01/31/17 1028 01/31/17 1655 02/01/17 0637 02/02/17 0526  AST 43* 50* 49* 30  ALT 28 31 33 26  ALKPHOS 112 105 93 90  BILITOT 0.8 0.7 0.6  0.6  PROT 7.3 7.0 6.8 6.3*  ALBUMIN 3.8 3.4* 3.2* 3.2*   BNP (last 3 results)  Recent Labs  01/31/17 1028  BNP 16.8   CBG:  Recent Labs Lab 02/01/17 1915 02/01/17 2209 02/02/17 0430 02/02/17 0809 02/02/17 1226  GLUCAP 139* 245* 262* 276* 187*   Hgb A1c  Recent Labs  01/31/17 1655  HGBA1C 12.6*   Urinalysis     Component Value Date/Time   COLORURINE YELLOW 01/31/2017 1427   APPEARANCEUR CLEAR 01/31/2017 1427   LABSPEC 1.013 01/31/2017 1427   PHURINE 5.0 01/31/2017 1427   GLUCOSEU >=500 (A) 01/31/2017 1427   GLUCOSEU >=1000 (A) 04/08/2016 1500   HGBUR SMALL (A) 01/31/2017 1427   BILIRUBINUR NEGATIVE 01/31/2017 1427   BILIRUBINUR neg 04/08/2016 1408   KETONESUR NEGATIVE 01/31/2017 1427   PROTEINUR NEGATIVE 01/31/2017 1427   UROBILINOGEN 0.2 04/08/2016 1500   NITRITE NEGATIVE 01/31/2017 1427   LEUKOCYTESUR NEGATIVE 01/31/2017 1427    Discussed in detail with spouse at bedside. Updated care and answered questions.   Time coordinating discharge: Over 30 minutes  SIGNED:  Vernell Leep, MD, FACP, [place]. [facility] Pager [phone]  If 7PM-7AM, please contact night-coverage [email or web address] Password [email or web address] 02/02/2017, 3:11 PM

## 2017-02-03 ENCOUNTER — Telehealth: Payer: Self-pay | Admitting: Internal Medicine

## 2017-02-03 NOTE — Telephone Encounter (Signed)
Pt was discharged from hospital 06/10 bs was upper 400's.  Dr. Loanne Drilling is out of town.  Please advise.  -LL

## 2017-02-03 NOTE — Telephone Encounter (Signed)
See message and please advise during Dr. Ellison's absence. Thanks!  

## 2017-02-03 NOTE — Telephone Encounter (Signed)
Please find out if she was getting steroids and is still getting any steroids If not then she can increase her NPH insulin to 40 units twice a day and take 20 units of regular insulin before each meal, she can reduce the dose down when her blood sugars are below 150

## 2017-02-03 NOTE — Telephone Encounter (Signed)
Patient advised of message and voiced understanding. She had no further questions at this time.

## 2017-02-04 NOTE — Telephone Encounter (Signed)
Discharge summary shows that she was treated for hyperglycemia, which remains the cause of her chronic nausea.

## 2017-02-05 ENCOUNTER — Ambulatory Visit (INDEPENDENT_AMBULATORY_CARE_PROVIDER_SITE_OTHER): Payer: Medicare HMO | Admitting: Internal Medicine

## 2017-02-05 ENCOUNTER — Other Ambulatory Visit (INDEPENDENT_AMBULATORY_CARE_PROVIDER_SITE_OTHER): Payer: Medicare HMO

## 2017-02-05 ENCOUNTER — Encounter: Payer: Self-pay | Admitting: Internal Medicine

## 2017-02-05 VITALS — BP 142/92 | HR 89 | Temp 98.1°F | Resp 16 | Wt 293.0 lb

## 2017-02-05 DIAGNOSIS — E860 Dehydration: Secondary | ICD-10-CM

## 2017-02-05 DIAGNOSIS — E1165 Type 2 diabetes mellitus with hyperglycemia: Secondary | ICD-10-CM | POA: Diagnosis not present

## 2017-02-05 DIAGNOSIS — Z1159 Encounter for screening for other viral diseases: Secondary | ICD-10-CM

## 2017-02-05 DIAGNOSIS — K7460 Unspecified cirrhosis of liver: Secondary | ICD-10-CM

## 2017-02-05 DIAGNOSIS — I10 Essential (primary) hypertension: Secondary | ICD-10-CM

## 2017-02-05 DIAGNOSIS — IMO0002 Reserved for concepts with insufficient information to code with codable children: Secondary | ICD-10-CM

## 2017-02-05 DIAGNOSIS — F411 Generalized anxiety disorder: Secondary | ICD-10-CM

## 2017-02-05 DIAGNOSIS — Z23 Encounter for immunization: Secondary | ICD-10-CM | POA: Diagnosis not present

## 2017-02-05 DIAGNOSIS — Z794 Long term (current) use of insulin: Secondary | ICD-10-CM

## 2017-02-05 DIAGNOSIS — R69 Illness, unspecified: Secondary | ICD-10-CM | POA: Diagnosis not present

## 2017-02-05 LAB — CBC WITH DIFFERENTIAL/PLATELET
BASOS ABS: 0.1 10*3/uL (ref 0.0–0.1)
Basophils Relative: 0.8 % (ref 0.0–3.0)
EOS ABS: 0.4 10*3/uL (ref 0.0–0.7)
Eosinophils Relative: 3.7 % (ref 0.0–5.0)
HCT: 40.1 % (ref 36.0–46.0)
Hemoglobin: 12.6 g/dL (ref 12.0–15.0)
LYMPHS ABS: 2.6 10*3/uL (ref 0.7–4.0)
Lymphocytes Relative: 25.7 % (ref 12.0–46.0)
MCHC: 31.5 g/dL (ref 30.0–36.0)
MCV: 77.7 fl — AB (ref 78.0–100.0)
MONOS PCT: 5.4 % (ref 3.0–12.0)
Monocytes Absolute: 0.6 10*3/uL (ref 0.1–1.0)
NEUTROS PCT: 64.4 % (ref 43.0–77.0)
Neutro Abs: 6.6 10*3/uL (ref 1.4–7.7)
Platelets: 200 10*3/uL (ref 150.0–400.0)
RBC: 5.16 Mil/uL — AB (ref 3.87–5.11)
RDW: 14.8 % (ref 11.5–15.5)
WBC: 10.3 10*3/uL (ref 4.0–10.5)

## 2017-02-05 LAB — COMPREHENSIVE METABOLIC PANEL
ALK PHOS: 94 U/L (ref 39–117)
ALT: 22 U/L (ref 0–35)
AST: 24 U/L (ref 0–37)
Albumin: 3.8 g/dL (ref 3.5–5.2)
BILIRUBIN TOTAL: 0.4 mg/dL (ref 0.2–1.2)
BUN: 17 mg/dL (ref 6–23)
CO2: 29 mEq/L (ref 19–32)
Calcium: 8.4 mg/dL (ref 8.4–10.5)
Chloride: 101 mEq/L (ref 96–112)
Creatinine, Ser: 1.19 mg/dL (ref 0.40–1.20)
GFR: 49.07 mL/min — AB (ref >60.00)
GLUCOSE: 287 mg/dL — AB (ref 70–99)
Potassium: 4.3 mEq/L (ref 3.5–5.1)
Sodium: 135 mEq/L (ref 135–145)
TOTAL PROTEIN: 6.7 g/dL (ref 6.0–8.3)

## 2017-02-05 LAB — CULTURE, BLOOD (ROUTINE X 2)
CULTURE: NO GROWTH
Culture: NO GROWTH
SPECIAL REQUESTS: ADEQUATE
Special Requests: ADEQUATE

## 2017-02-05 LAB — MAGNESIUM: MAGNESIUM: 1.5 mg/dL (ref 1.5–2.5)

## 2017-02-05 LAB — MICROALBUMIN / CREATININE URINE RATIO
CREATININE, U: 220.1 mg/dL
MICROALB UR: 0.8 mg/dL (ref 0.0–1.9)
MICROALB/CREAT RATIO: 0.4 mg/g (ref 0.0–30.0)

## 2017-02-05 LAB — HEPATITIS C ANTIBODY: HCV Ab: NEGATIVE

## 2017-02-05 MED ORDER — BUSPIRONE HCL 7.5 MG PO TABS: 7.5000 mg | ORAL_TABLET | Freq: Three times a day (TID) | ORAL | 3 refills | Status: DC

## 2017-02-05 NOTE — Patient Instructions (Addendum)
  Test(s) ordered today. Your results will be released to [place] (or called to you) after review, usually within 72hours after test completion. If any changes need to be made, you will be notified at that same time.  All other Health Maintenance issues reviewed.   All recommended immunizations and age-appropriate screenings are up-to-date or discussed.  Hepatitis A/B immunization administered today.   Medications reviewed and updated.  Changes include starting buspirone for anxiety.   Your prescription(s) have been submitted to your pharmacy. Please take as directed and contact our office if you believe you are having problem(s) with the medication(s).   Please followup in 2 weeks

## 2017-02-05 NOTE — Assessment & Plan Note (Signed)
Not controlled Management per Dr Loanne Drilling Stressed increasing activity, weight loss Work on diet changes

## 2017-02-05 NOTE — Assessment & Plan Note (Signed)
Resolved with ivf and holding ARB, hctz Cmp, mg today

## 2017-02-05 NOTE — Progress Notes (Signed)
Subjective:    Patient ID: Mary Acosta, female    DOB: 28-May-1956, 61 y.o.   MRN: [id number]  HPI The patient is here for follow up.  Admitted 01/31/17-02/02/17.  Recommendations for Outpatient Follow-up:  1. Dr. Celso Amy, PCP in 2 days with repeat labs (CBC, BMP & Mg). Also follow-up regarding diabetes management. Please follow final blood culture results that were sent from the hospital. 2. Dr. Renato Shin, Endocrinology in 1 week regarding management of poorly controlled IDDM.  She presented to the emergency room with anemia and chest tightness, but denied chest pain. She was found to be hypotensive with a blood pressure of 79/46, acute kidney injury and hyperglycemia with a sugar of 475. There was some confusion, which was felt to be related to dehydration and acute kidney injury.  Confusion/metabolic encephalopathy:  Likely secondary to dehydration, acute kidney injury and sedating medications with decreased renal clearance. This improved with IV fluids, decreased sedating medications and was evident when given Narcan that decreased kidney function likely resulted in decreased narcotic metabolism.  Acute kidney injury on chronic kidney disease: Her creatinine in the emergency room was 2.33 and her recent creatinine was 0.89. This was felt to be secondary to dehydration likely due to being on an ARB and hydrochlorothiazide. These medications were held. She was hydrated briefly with IV fluids. Creatinine improved. She needs follow-up blood work. The ARB and hydrochlorothiazide were held.  Poorly controlled diabetes: She states she has been compliant with her insulin, but not compliant with a diabetic diet. A1c is 12.6. There is no evidence of DKA. While in the hospital she was on Lantus and sliding scale insulin. Sugars mostly in the mid 200s. Her home NPH was changed to 30 units twice daily and NovoLog sliding scale was started. She needs to follow-up with her endocrinologist,  Dr. Loanne Drilling.  Dr Loanne Drilling did call her and made changes to her insulin.   Hyponatremia: Secondary to dehydration, hyperglycemia and hydrochlorothiazide. This resolved.  Chronic pain, back and fibromyalgia: She follows with pain management. The hospitalist reviewed her pain management notes and she was supposed to be taking Dilaudid 4 mg every 8 hours as needed not every 6 hours. This was changed at discharge. No new prescriptions were given. Amitriptyline was decreased to 50 mg at bedtime.  Hypertension:  She presented with hypotension.  Her BP started to increase and hydralazine was resumed.  ARB and Hctz were not restarted.  Her BP at home this morning before taking her medication was 200/100.    Elevated lactate:  Likely secondary to dehydration, hypotension, poor tissue and acute kidney injury.  She was hydrated.  She states she was having muscle aches prior to going to the hospital and it resolved with hydration.   Anxiety:  She has anxiety attacks.  She has panic attacks.  She feels very anxious.  She does not feel depressed.  She thinks this is what was causing the hypoxia and SOB when she went to the hospital.  She has seen psychiatry in the past and has been on medication.  She feels she needs something for her anxiety - it is significantly affecting her quality of life.    Medications and allergies reviewed with patient and updated if appropriate.  Patient Active Problem List   Diagnosis Date Noted  . Acute kidney injury ([place]) 01/31/2017  . OSA (obstructive sleep apnea) 01/31/2017  . Hypertension 01/31/2017  . Liver cirrhosis secondary to NASH ([place]) 01/31/2017  .  Hypothyroidism 01/31/2017  . Uncontrolled type 2 diabetes mellitus with insulin therapy ([place]) 01/31/2017  . Dehydration, moderate 01/31/2017  . Chronic narcotic use 01/31/2017  . Fibromyalgia 01/31/2017  . Hyperlipidemia 01/31/2017  . Hypotension 01/31/2017  . Muscle pain 12/10/2016  . Gastroparesis   . Memory  difficulty 07/25/2016  . Pain in toe 04/09/2016  . Lump in neck 01/29/2016  . Chronic diastolic (congestive) heart failure ([place]) 01/03/2016  . Fatty liver 01/03/2016  . Type 1 diabetes mellitus ([place]) 01/03/2016  . Recurrent Clostridium difficile diarrhea 01/03/2016  . Cirrhosis of liver without ascites ([place]) 12/07/2015  . Chronic respiratory failure ([place]) 11/10/2014  . Physical deconditioning 09/26/2014  . Dyspnea 09/26/2014  . HCAP (healthcare-associated pneumonia) 08/26/2014  . COPD (chronic obstructive pulmonary disease) ([place]) 08/26/2014  . Iron deficiency anemia, unspecified  04/05/2011  . Bariatric surgery status 04/05/2011  . Left arm pain   . UNSPECIFIED VITAMIN D DEFICIENCY 10/22/2007  . LOW BACK PAIN, CHRONIC 10/22/2007  . INSOMNIA 10/22/2007  . Obesity 10/14/2007  . Chronic pain syndrome 10/14/2007  . CARPAL TUNNEL SYNDROME 10/14/2007  . PERIPHERAL NEUROPATHY 10/14/2007  . ALLERGIC RHINITIS CAUSE UNSPECIFIED 10/14/2007  . ARTHRITIS 10/14/2007  . Hypothyroid 10/14/2007  . HLD (hyperlipidemia) 09/14/2007  . Anxiety state 08/25/2007  . Depression 08/25/2007  . Essential hypertension 08/25/2007  . ASTHMA 08/25/2007  . CONSTIPATION 08/25/2007  . POLYMYALGIA RHEUMATICA 08/25/2007  . LEG EDEMA, BILATERAL 08/25/2007    Current Outpatient Prescriptions on File Prior to Visit  Medication Sig Dispense Refill  . albuterol (PROVENTIL HFA;VENTOLIN HFA) 108 (90 BASE) MCG/ACT inhaler Inhale 2 puffs into the lungs every 6 (six) hours as needed for wheezing or shortness of breath. 1 Inhaler 2  . amitriptyline (ELAVIL) 25 MG tablet Take 2 tablets (50 mg total) by mouth at bedtime.    Marland Kitchen atorvastatin (LIPITOR) 40 MG tablet Take 1 tablet (40 mg total) by mouth daily. 90 tablet 3  . hydrALAZINE (APRESOLINE) 25 MG tablet Take 1 tablet (25 mg total) by mouth 3 (three) times daily. 90 tablet 5  . HYDROmorphone (DILAUDID) 4 MG tablet Take 1 tablet (4 mg total) by mouth every 8 (eight) hours  as needed for moderate pain.    Marland Kitchen insulin aspart (NOVOLOG) 100 UNIT/ML injection Inject 0-20 Units into the skin 3 (three) times daily with meals. CBG < 70: Eat or drink something sweet and recheck; CBG 70 - 120: 0 units;CBG 121 - 150: 3 units;CBG 151 - 200: 4 units;CBG 201 - 250: 7 units;CBG 251 - 300: 11 units;CBG 301 - 350: 15 units;CBG 351 - 400: 20 units;CBG > 400: call MD. 10 mL 0  . insulin NPH Human (NOVOLIN N) 100 UNIT/ML injection Inject 0.3 mLs (30 Units total) into the skin 2 (two) times daily before a meal.    . Insulin Syringe-Needle U-100 (INSULIN SYRINGE 1CC/30GX5/16") 30G X 5/16" 1 ML MISC Use as directed for insulin injections 5 times daily 150 each 1  . ipratropium-albuterol (DUONEB) 0.5-2.5 (3) MG/3ML SOLN Take 3 mLs by nebulization every 6 (six) hours as needed (Wheezing or dyspnea.).    Marland Kitchen levothyroxine (SYNTHROID, LEVOTHROID) 175 MCG tablet Take 1 tablet (175 mcg total) by mouth daily before breakfast. 90 tablet 1  . Lidocaine HCl (ASPERCREME LIDOCAINE) 4 % LIQD Apply 1 application topically daily as needed (pain).    . meloxicam (MOBIC) 7.5 MG tablet Take 7.5 mg by mouth 2 (two) times daily.   5  . metoCLOPramide (REGLAN) 5 MG tablet Take 1  tablet (5 mg total) by mouth 3 (three) times daily before meals. 90 tablet 2  . morphine (MS CONTIN) 30 MG 12 hr tablet Take 30 mg by mouth 2 (two) times daily.    Marland Kitchen tiZANidine (ZANAFLEX) 2 MG tablet Take 2 mg by mouth 3 (three) times daily.  2   No current facility-administered medications on file prior to visit.     Past Medical History:  Diagnosis Date  . AKI (acute kidney injury) ([place]) 01/2017  . Anxiety    with panic attacks  . Arthritis    "back; feet; hands; shoulders" (08/26/2014)  . Asthma   . Cervical cancer ([place])   . Chronic lower back pain   . Chronic narcotic use   . Chronic pain syndrome    [facility]  . Cirrhosis ([place])   . Clostridium difficile infection 2017  . COPD (chronic obstructive pulmonary  disease) ([place])   . Daily headache   . Depression   . DJD (degenerative joint disease)   . Fatty liver disease, nonalcoholic   . Fibromyalgia   . Frequency of urination   . HCAP (healthcare-associated pneumonia) 08/26/2014  . History of TIA (transient ischemic attack) 11-01-2010   NO RESIDUAL  . Hyperlipidemia   . Hypertension   . Hypothyroidism   . IDDM (insulin dependent diabetes mellitus) ([place])   . Insomnia   . Lumbar stenosis   . Memory difficulty 07/25/2016  . Nocturia   . OSA (obstructive sleep apnea)    NO CPAP SINCE WT LOSS  . Osteoarthritis    with severe disease in knee  . Pneumonia "several times"  . Polymyalgia rheumatica ([place])   . Scoliosis   . Seasonal allergies   . Thyroid cancer ([place])   . Urgency of urination   . Vaginal pain S/P SLING  FEB 2012    Past Surgical History:  Procedure Laterality Date  . APPENDECTOMY  1982  . BREAST LUMPECTOMY Left 02-28-2005   ATYPICAL DUCTAL HYPERPLASIA  . CARDIAC CATHETERIZATION  09-04-2004   NORMAL CORONARY ANATOMY/ NORMAL LVF/ EF 60%  . CARDIOVASCULAR STRESS TEST  12-27-2010  DR [place]   ABNORMAL NUCLEAR STUDY W/ /MILD INFERIOR ISCHEMIA/ EF 69%/  CT HEART ANGIOGRAM ;  NO ACUTE FINDINGS  . CRYOABLATION  05/16/2003   w/LEEP FOR ABNORMAL PAP SMEAR  . CYSTOSCOPY  05/18/2012   Procedure: CYSTOSCOPY;  Surgeon: Reece Packer, MD;  Location[facility];  Service: Urology;  Laterality: N/A;  examination under anethesia  . ESOPHAGOGASTRODUODENOSCOPY (EGD) WITH PROPOFOL N/A 09/03/2016   Procedure: ESOPHAGOGASTRODUODENOSCOPY (EGD) WITH PROPOFOL;  Surgeon: Doran Stabler, MD;  Location: [facility];  Service: Gastroenterology;  Laterality: N/A;  . HYSTEROSCOPY W/D&C  08-19-2007   PMB  . KNEE ARTHROSCOPY W/ DEBRIDEMENT Left 03/29/2006   INTERNAL DERANGEMENT/ SEVERE DJD/ MENISCUS TEARS  . LAPAROSCOPIC CHOLECYSTECTOMY  06-10-2002  . LAPAROSCOPIC GASTRIC BANDING  03/01/2006   TRUNCAL VAGOTOMY/ PLACEMENT OF VG BAND    . REVISION TOTAL KNEE ARTHROPLASTY Left 08-29-2008; 05/2009  . TONSILLECTOMY  1969  . TOTAL KNEE ARTHROPLASTY Left 01-23-2007   SEVERE DJD  . TOTAL THYROIDECTOMY  11-22-2005   BILATERAL THYROID NODULES-- PAPILLARY CARCINOMA (0.5CM)/ ADENOMATOID NODULES  . TRANSTHORACIC ECHOCARDIOGRAM  12-27-2010   LVSF NORMAL / EF 16-10%/ GRADE I DIASTOLIC DYSFUNCTION/ MILD MITRAL REGURG. / MILDLY DILATED LEFT ATRIUM/ MILDY INCREASED SYSTOLIC PRESSURE OF PULMONARY ARTERIES  . TRANSVAGINAL SUBURETERAL TAPE/ SLING  09-28-2010   MIXED URINARY INCONTINENCE  . TUBAL  LIGATION  1983    Social History   Social History  . Marital status: Married    Spouse name: N/A  . Number of children: 2  . Years of education: 12   Occupational History  . disabled Unemployed   Social History Main Topics  . Smoking status: Former Smoker    Packs/day: 2.50    Years: 39.00    Types: Cigarettes    Quit date: 10/22/2010  . Smokeless tobacco: Never Used  . Alcohol use No  . Drug use: No  . Sexual activity: Not Currently   Other Topics Concern  . Not on file   Social History Narrative   Lives at home w/ her husband and grandson   Right-handed   Caffeine: 1 cup of coffee per week + [employer]    Family History  Problem Relation Age of Onset  . Diabetes Mother   . Heart disease Mother   . Dementia Mother   . Heart disease Father   . High blood pressure Father   . Colon cancer Maternal Uncle        x 2  . Breast cancer Other        great aunts x 5    Review of Systems  Constitutional: Positive for fatigue. Negative for chills and fever.  Respiratory: Positive for shortness of breath (only with panic attack). Negative for cough and wheezing.   Cardiovascular: Positive for palpitations and leg swelling (intermittent). Negative for chest pain.  Gastrointestinal: Positive for abdominal pain and nausea.  Genitourinary: Negative for dysuria and hematuria.  Neurological: Negative for light-headedness and headaches.   Psychiatric/Behavioral: Positive for sleep disturbance. Negative for dysphoric mood. The patient is nervous/anxious.        Objective:   Vitals:   02/05/17 1010  BP: (!) 142/92  Pulse: 89  Resp: 16  Temp: 98.1 F (36.7 C)   Wt Readings from Last 3 Encounters:  02/05/17 293 lb (132.9 kg)  02/01/17 286 lb 9.6 oz (130 kg)  01/22/17 298 lb (135.2 kg)   Body mass index is 43.27 kg/m.   Physical Exam    Constitutional: Appears well-developed and well-nourished. No distress.  HENT:  Head: Normocephalic and atraumatic.  Neck: Neck supple. No tracheal deviation present. No thyromegaly present.  No cervical lymphadenopathy Cardiovascular: Normal rate, regular rhythm and normal heart sounds.   No murmur heard. No carotid bruit .  Trace b/l LE edema Pulmonary/Chest: Effort normal and breath sounds normal. No respiratory distress. No has no wheezes. No rales.  Skin: Skin is warm and dry. Not diaphoretic.  Psychiatric: mildly anxious mood and affect. Behavior is normal.      Assessment & Plan:    See Problem List for Assessment and Plan of chronic medical problems.    FU in 2 weeks - f/u on buspar and hypertension

## 2017-02-05 NOTE — Assessment & Plan Note (Signed)
Hep A/B vaccination #1 today  Stressed weight loss

## 2017-02-05 NOTE — Assessment & Plan Note (Signed)
BP slightly elevated here - ? High at home this morning - ? Accurate home cuff No change in medication given recent hospitalization F/u in 2 weeks to recheck BP  - monitor at home and keep log

## 2017-02-05 NOTE — Assessment & Plan Note (Signed)
Uncontrolled anxiety Affecting her quality of life and her ability to get healthy Will try buspar Follow up in 2 weeks

## 2017-02-06 ENCOUNTER — Encounter: Payer: Self-pay | Admitting: Internal Medicine

## 2017-02-09 DIAGNOSIS — J449 Chronic obstructive pulmonary disease, unspecified: Secondary | ICD-10-CM | POA: Diagnosis not present

## 2017-02-11 ENCOUNTER — Telehealth: Payer: Self-pay | Admitting: Cardiology

## 2017-02-11 MED ORDER — AMLODIPINE BESYLATE 5 MG PO TABS: 5.0000 mg | ORAL_TABLET | Freq: Every day | ORAL | 3 refills | Status: DC

## 2017-02-11 NOTE — Telephone Encounter (Signed)
Pt advised, will enact MD-advised changes to meds and f/u BPs. She'll plan to f/u w Dr. Quay Burow as scheduled and call if continued concerns.

## 2017-02-11 NOTE — Telephone Encounter (Signed)
Dc valsartan; amlodipine 5 mg daily and follow BP Kirk Ruths

## 2017-02-11 NOTE — Telephone Encounter (Signed)
Pt of Dr. Stanford Breed Re: HTN  Returned call. Pt notes concern for BP elevations when waking. Also voices headache and blurry vision. Notes this also may be d/t CBG elevations.  She took BP approx 8:30am when she got up today, was 209/123. She rechecked while on phone w me, was 210/102  She states BP has been running high since she left hospital 6/10. She saw Dr. Quay Burow for hosp f/u on 6/13 & has return visit scheduled 7/2.  Pt notes she was discharged from hospital on hydralazine TID as only BP med, but she restarted her valsartan at 160mg  daily starting yesterday due to consistent BP elevations.  Pt notes she was told she had pulmonary hypertension and as she understands, this is probably why her AM BPs are so elevated.   I recommended she go to ED for marked BP elevations, but she declined, stating strong preference for office visit. No upcoming appt availability on Dr. Jacalyn Lefevre schedule. For now patient elects to f/u w Dr. Quay Burow in 2 weeks and continue med regimen. She agreed to go to ED if symptoms worse.  Advised OK to continue Valsartan - added back to med list- will get Dr. Jacalyn Lefevre recommendations on further med increases.

## 2017-02-11 NOTE — Telephone Encounter (Signed)
Pt c/o BP issue: STAT if pt c/o blurred vision, one-sided weakness or slurred speech  1. What are your last 5 BP readings? 209/123  2. Are you having any other symptoms (ex. Dizziness, headache, blurred vision, passed out)?  Patient states that she can barely open her eyes, headache on top of head, can't see.  3. What is your BP issue? High BP (consistently)

## 2017-02-12 ENCOUNTER — Telehealth: Payer: Self-pay | Admitting: *Deleted

## 2017-02-12 NOTE — Telephone Encounter (Signed)
Called and spoke with patient. Advised we received refill request for amitriptyline and keppra to send 90 day supply to her pharmacy. She went to ED on 02/01/17 for abnormal BP and glucose. She stopped keppra after being discharged. She sometimes takes amitriptyline if her neuropathy is bad. But she wishes to stop medication. She states her pain has been better controlled since leaving hospital. She feels pain r/t blood sugar, which she is better controlling now. She does not want to take medication if she does not have to.   I advised pt to call next time to let us know if she would like to stop medication. Sometimes we need to taper her off slowly so she does not have any SE. Pt denies any problems, SE.  Advised I will send message to Dr Jannifer Franklin so he is aware. If he has any further questions he will call. She verbalized understanding.

## 2017-02-12 NOTE — Telephone Encounter (Signed)
I called the patient. When I saw her in office we had reduced the amitriptyline to 75 mg and eventually to 50 mg at night. If she wishes to come off the medication, she makes go down to 25 mg at night for 2 weeks and then stop the drug.

## 2017-02-14 DIAGNOSIS — C73 Malignant neoplasm of thyroid gland: Secondary | ICD-10-CM | POA: Diagnosis not present

## 2017-02-14 DIAGNOSIS — I10 Essential (primary) hypertension: Secondary | ICD-10-CM | POA: Diagnosis not present

## 2017-02-14 DIAGNOSIS — K7581 Nonalcoholic steatohepatitis (NASH): Secondary | ICD-10-CM | POA: Diagnosis not present

## 2017-02-14 DIAGNOSIS — G4734 Idiopathic sleep related nonobstructive alveolar hypoventilation: Secondary | ICD-10-CM | POA: Diagnosis not present

## 2017-02-14 DIAGNOSIS — J45909 Unspecified asthma, uncomplicated: Secondary | ICD-10-CM | POA: Diagnosis not present

## 2017-02-14 DIAGNOSIS — E118 Type 2 diabetes mellitus with unspecified complications: Secondary | ICD-10-CM | POA: Diagnosis not present

## 2017-02-14 DIAGNOSIS — M4608 Spinal enthesopathy, sacral and sacrococcygeal region: Secondary | ICD-10-CM | POA: Diagnosis not present

## 2017-02-14 DIAGNOSIS — R69 Illness, unspecified: Secondary | ICD-10-CM | POA: Diagnosis not present

## 2017-02-14 DIAGNOSIS — M2550 Pain in unspecified joint: Secondary | ICD-10-CM | POA: Diagnosis not present

## 2017-02-14 DIAGNOSIS — Z794 Long term (current) use of insulin: Secondary | ICD-10-CM | POA: Diagnosis not present

## 2017-02-22 NOTE — Progress Notes (Signed)
Subjective:    Patient ID: Mary Acosta, female    DOB: 1956/07/21, 61 y.o.   MRN: [id number]  HPI The patient is here for follow up.  Hypertension: She is taking her medication daily. She is compliant with a low sodium diet.  She has leg edema if she does not elevate her legs when sitting - this is chronic and unchanged.  She is having headaches.  She denies chest pain, palpitations. She is not exercising regularly.  She does monitor her blood pressure at home and it has been what it is here today.      Anxiety:  She is taking the buspar as prescribed. She feels it has helped and her anxiety is controlled.    ? B12 def: She has gotten B12 injections in the past and wonders if she can have one. She does have numbness/tingling in both hands.  Medications and allergies reviewed with patient and updated if appropriate.  Patient Active Problem List   Diagnosis Date Noted  . Acute kidney injury ([place]) 01/31/2017  . OSA (obstructive sleep apnea) 01/31/2017  . Liver cirrhosis secondary to NASH ([place]) 01/31/2017  . Hypothyroidism 01/31/2017  . Uncontrolled type 2 diabetes mellitus with insulin therapy ([place]) 01/31/2017  . Dehydration, moderate 01/31/2017  . Chronic narcotic use 01/31/2017  . Fibromyalgia 01/31/2017  . Hyperlipidemia 01/31/2017  . Hypotension 01/31/2017  . Gastroparesis   . Memory difficulty 07/25/2016  . Pain in toe 04/09/2016  . Lump in neck 01/29/2016  . Chronic diastolic (congestive) heart failure ([place]) 01/03/2016  . Fatty liver 01/03/2016  . Type 1 diabetes mellitus ([place]) 01/03/2016  . Recurrent Clostridium difficile diarrhea 01/03/2016  . Cirrhosis of liver without ascites ([place]) 12/07/2015  . Chronic respiratory failure ([place]) 11/10/2014  . Physical deconditioning 09/26/2014  . Dyspnea 09/26/2014  . HCAP (healthcare-associated pneumonia) 08/26/2014  . COPD (chronic obstructive pulmonary disease) ([place]) 08/26/2014  . Iron deficiency anemia, unspecified   04/05/2011  . Bariatric surgery status 04/05/2011  . Left arm pain   . UNSPECIFIED VITAMIN D DEFICIENCY 10/22/2007  . LOW BACK PAIN, CHRONIC 10/22/2007  . INSOMNIA 10/22/2007  . Obesity 10/14/2007  . Chronic pain syndrome 10/14/2007  . CARPAL TUNNEL SYNDROME 10/14/2007  . PERIPHERAL NEUROPATHY 10/14/2007  . ALLERGIC RHINITIS CAUSE UNSPECIFIED 10/14/2007  . ARTHRITIS 10/14/2007  . Hypothyroid 10/14/2007  . Anxiety state 08/25/2007  . Depression 08/25/2007  . Essential hypertension 08/25/2007  . ASTHMA 08/25/2007  . CONSTIPATION 08/25/2007  . POLYMYALGIA RHEUMATICA 08/25/2007  . LEG EDEMA, BILATERAL 08/25/2007    Current Outpatient Prescriptions on File Prior to Visit  Medication Sig Dispense Refill  . albuterol (PROVENTIL HFA;VENTOLIN HFA) 108 (90 BASE) MCG/ACT inhaler Inhale 2 puffs into the lungs every 6 (six) hours as needed for wheezing or shortness of breath. 1 Inhaler 2  . amitriptyline (ELAVIL) 25 MG tablet Take 2 tablets (50 mg total) by mouth at bedtime.    Marland Kitchen amLODipine (NORVASC) 5 MG tablet Take 1 tablet (5 mg total) by mouth daily. 30 tablet 3  . atorvastatin (LIPITOR) 40 MG tablet Take 1 tablet (40 mg total) by mouth daily. 90 tablet 3  . busPIRone (BUSPAR) 7.5 MG tablet Take 1 tablet (7.5 mg total) by mouth 3 (three) times daily. 90 tablet 3  . hydrALAZINE (APRESOLINE) 25 MG tablet Take 1 tablet (25 mg total) by mouth 3 (three) times daily. 90 tablet 5  . HYDROmorphone (DILAUDID) 4 MG tablet Take 1 tablet (4 mg total) by mouth  every 8 (eight) hours as needed for moderate pain.    Marland Kitchen insulin aspart (NOVOLOG) 100 UNIT/ML injection Inject 0-20 Units into the skin 3 (three) times daily with meals. CBG < 70: Eat or drink something sweet and recheck; CBG 70 - 120: 0 units;CBG 121 - 150: 3 units;CBG 151 - 200: 4 units;CBG 201 - 250: 7 units;CBG 251 - 300: 11 units;CBG 301 - 350: 15 units;CBG 351 - 400: 20 units;CBG > 400: call MD. 10 mL 0  . insulin NPH Human (NOVOLIN N) 100  UNIT/ML injection Inject 0.3 mLs (30 Units total) into the skin 2 (two) times daily before a meal.    . Insulin Syringe-Needle U-100 (INSULIN SYRINGE 1CC/30GX5/16") 30G X 5/16" 1 ML MISC Use as directed for insulin injections 5 times daily 150 each 1  . ipratropium-albuterol (DUONEB) 0.5-2.5 (3) MG/3ML SOLN Take 3 mLs by nebulization every 6 (six) hours as needed (Wheezing or dyspnea.).    Marland Kitchen levothyroxine (SYNTHROID, LEVOTHROID) 175 MCG tablet Take 1 tablet (175 mcg total) by mouth daily before breakfast. 90 tablet 1  . Lidocaine HCl (ASPERCREME LIDOCAINE) 4 % LIQD Apply 1 application topically daily as needed (pain).    . meloxicam (MOBIC) 7.5 MG tablet Take 7.5 mg by mouth 2 (two) times daily.   5  . metoCLOPramide (REGLAN) 5 MG tablet Take 1 tablet (5 mg total) by mouth 3 (three) times daily before meals. 90 tablet 2  . morphine (MS CONTIN) 30 MG 12 hr tablet Take 30 mg by mouth 2 (two) times daily.    Marland Kitchen tiZANidine (ZANAFLEX) 2 MG tablet Take 2 mg by mouth 3 (three) times daily.  2   No current facility-administered medications on file prior to visit.     Past Medical History:  Diagnosis Date  . AKI (acute kidney injury) ([place]) 01/2017  . Anxiety    with panic attacks  . Arthritis    "back; feet; hands; shoulders" (08/26/2014)  . Asthma   . Cervical cancer ([place])   . Chronic lower back pain   . Chronic narcotic use   . Chronic pain syndrome    [facility]  . Cirrhosis ([place])   . Clostridium difficile infection 2017  . COPD (chronic obstructive pulmonary disease) ([place])   . Daily headache   . Depression   . DJD (degenerative joint disease)   . Fatty liver disease, nonalcoholic   . Fibromyalgia   . Frequency of urination   . HCAP (healthcare-associated pneumonia) 08/26/2014  . History of TIA (transient ischemic attack) 11-01-2010   NO RESIDUAL  . Hyperlipidemia   . Hypertension   . Hypothyroidism   . IDDM (insulin dependent diabetes mellitus) ([place])   . Insomnia   .  Lumbar stenosis   . Memory difficulty 07/25/2016  . Nocturia   . OSA (obstructive sleep apnea)    NO CPAP SINCE WT LOSS  . Osteoarthritis    with severe disease in knee  . Pneumonia "several times"  . Polymyalgia rheumatica ([place])   . Scoliosis   . Seasonal allergies   . Thyroid cancer ([place])   . Urgency of urination   . Vaginal pain S/P SLING  FEB 2012    Past Surgical History:  Procedure Laterality Date  . APPENDECTOMY  1982  . BREAST LUMPECTOMY Left 02-28-2005   ATYPICAL DUCTAL HYPERPLASIA  . CARDIAC CATHETERIZATION  09-04-2004   NORMAL CORONARY ANATOMY/ NORMAL LVF/ EF 60%  . CARDIOVASCULAR STRESS TEST  12-27-2010  DR [place]  ABNORMAL NUCLEAR STUDY W/ /MILD INFERIOR ISCHEMIA/ EF 69%/  CT HEART ANGIOGRAM ;  NO ACUTE FINDINGS  . CRYOABLATION  05/16/2003   w/LEEP FOR ABNORMAL PAP SMEAR  . CYSTOSCOPY  05/18/2012   Procedure: CYSTOSCOPY;  Surgeon: Reece Packer, MD;  [facility];  Service: Urology;  Laterality: N/A;  examination under anethesia  . ESOPHAGOGASTRODUODENOSCOPY (EGD) WITH PROPOFOL N/A 09/03/2016   Procedure: ESOPHAGOGASTRODUODENOSCOPY (EGD) WITH PROPOFOL;  Surgeon: Doran Stabler, MD;  Location: [facility];  Service: Gastroenterology;  Laterality: N/A;  . HYSTEROSCOPY W/D&C  08-19-2007   PMB  . KNEE ARTHROSCOPY W/ DEBRIDEMENT Left 03/29/2006   INTERNAL DERANGEMENT/ SEVERE DJD/ MENISCUS TEARS  . LAPAROSCOPIC CHOLECYSTECTOMY  06-10-2002  . LAPAROSCOPIC GASTRIC BANDING  03/01/2006   TRUNCAL VAGOTOMY/ PLACEMENT OF VG BAND  . REVISION TOTAL KNEE ARTHROPLASTY Left 08-29-2008; 05/2009  . TONSILLECTOMY  1969  . TOTAL KNEE ARTHROPLASTY Left 01-23-2007   SEVERE DJD  . TOTAL THYROIDECTOMY  11-22-2005   BILATERAL THYROID NODULES-- PAPILLARY CARCINOMA (0.5CM)/ ADENOMATOID NODULES  . TRANSTHORACIC ECHOCARDIOGRAM  12-27-2010   LVSF NORMAL / EF 42-35%/ GRADE I DIASTOLIC DYSFUNCTION/ MILD MITRAL REGURG. / MILDLY DILATED LEFT ATRIUM/ MILDY INCREASED  SYSTOLIC PRESSURE OF PULMONARY ARTERIES  . TRANSVAGINAL SUBURETERAL TAPE/ SLING  09-28-2010   MIXED URINARY INCONTINENCE  . TUBAL LIGATION  1983    Social History   Social History  . Marital status: Married    Spouse name: N/A  . Number of children: 2  . Years of education: 12   Occupational History  . disabled Unemployed   Social History Main Topics  . Smoking status: Former Smoker    Packs/day: 2.50    Years: 39.00    Types: Cigarettes    Quit date: 10/22/2010  . Smokeless tobacco: Never Used  . Alcohol use No  . Drug use: No  . Sexual activity: Not Currently   Other Topics Concern  . Not on file   Social History Narrative   Lives at home w/ her husband and grandson   Right-handed   Caffeine: 1 cup of coffee per week + Pepsi    Family History  Problem Relation Age of Onset  . Diabetes Mother   . Heart disease Mother   . Dementia Mother   . Heart disease Father   . High blood pressure Father   . Colon cancer Maternal Uncle        x 2  . Breast cancer Other        great aunts x 5    Review of Systems  Constitutional: Negative for chills and fever.  Respiratory: Positive for shortness of breath (from copd). Negative for cough and wheezing.   Cardiovascular: Positive for leg swelling (if she sits with legs down). Negative for chest pain and palpitations.  Neurological: Positive for light-headedness (sometimes) and headaches (on top of head).       Objective:   Vitals:   02/24/17 1117  BP: (!) 202/112  Pulse: 97  Resp: 20  Temp: 97.9 F (36.6 C)   Wt Readings from Last 3 Encounters:  02/24/17 297 lb (134.7 kg)  02/05/17 293 lb (132.9 kg)  02/01/17 286 lb 9.6 oz (130 kg)   Body mass index is 43.86 kg/m.   Physical Exam    Constitutional: Appears well-developed and well-nourished. No distress.  HENT:  Head: Normocephalic and atraumatic.  Neck: Neck supple. No tracheal deviation present. No thyromegaly present.  No cervical  lymphadenopathy Cardiovascular: Normal  rate, regular rhythm and normal heart sounds.   No murmur heard. No carotid bruit .  No edema Pulmonary/Chest: Effort normal and breath sounds normal. No respiratory distress. No has no wheezes. No rales.  Skin: Skin is warm and dry. Not diaphoretic.  Psychiatric: Normal mood and affect. Behavior is normal.      Assessment & Plan:    See Problem List for Assessment and Plan of chronic medical problems.

## 2017-02-24 ENCOUNTER — Other Ambulatory Visit (INDEPENDENT_AMBULATORY_CARE_PROVIDER_SITE_OTHER): Payer: Medicare HMO

## 2017-02-24 ENCOUNTER — Encounter: Payer: Self-pay | Admitting: Internal Medicine

## 2017-02-24 ENCOUNTER — Ambulatory Visit (INDEPENDENT_AMBULATORY_CARE_PROVIDER_SITE_OTHER): Payer: Medicare HMO | Admitting: Internal Medicine

## 2017-02-24 VITALS — BP 202/112 | HR 97 | Temp 97.9°F | Resp 20 | Wt 297.0 lb

## 2017-02-24 DIAGNOSIS — R202 Paresthesia of skin: Secondary | ICD-10-CM

## 2017-02-24 DIAGNOSIS — R200 Anesthesia of skin: Secondary | ICD-10-CM | POA: Diagnosis not present

## 2017-02-24 DIAGNOSIS — I10 Essential (primary) hypertension: Secondary | ICD-10-CM

## 2017-02-24 DIAGNOSIS — R69 Illness, unspecified: Secondary | ICD-10-CM | POA: Diagnosis not present

## 2017-02-24 DIAGNOSIS — F411 Generalized anxiety disorder: Secondary | ICD-10-CM | POA: Diagnosis not present

## 2017-02-24 LAB — COMPREHENSIVE METABOLIC PANEL
ALBUMIN: 3.8 g/dL (ref 3.5–5.2)
ALT: 17 U/L (ref 0–35)
AST: 22 U/L (ref 0–37)
Alkaline Phosphatase: 110 U/L (ref 39–117)
BUN: 21 mg/dL (ref 6–23)
CHLORIDE: 100 meq/L (ref 96–112)
CO2: 33 meq/L — AB (ref 19–32)
Calcium: 8.7 mg/dL (ref 8.4–10.5)
Creatinine, Ser: 0.86 mg/dL (ref 0.40–1.20)
GFR: 71.37 mL/min (ref >60.00)
Glucose, Bld: 219 mg/dL — ABNORMAL HIGH (ref 70–99)
POTASSIUM: 3.7 meq/L (ref 3.5–5.1)
SODIUM: 138 meq/L (ref 135–145)
Total Bilirubin: 0.4 mg/dL (ref 0.2–1.2)
Total Protein: 7.1 g/dL (ref 6.0–8.3)

## 2017-02-24 LAB — VITAMIN B12: Vitamin B-12: 287 pg/mL (ref 211–911)

## 2017-02-24 MED ORDER — HYDRALAZINE HCL 50 MG PO TABS: 50.0000 mg | ORAL_TABLET | Freq: Three times a day (TID) | ORAL | 5 refills | Status: DC

## 2017-02-24 NOTE — Patient Instructions (Signed)
Increase the hydralazine to 50 mg three times a day.    Monitor your BP at home.    Follow up in 1 week.

## 2017-02-24 NOTE — Assessment & Plan Note (Signed)
Improving with buspar - continue at current dose

## 2017-02-24 NOTE — Assessment & Plan Note (Signed)
Check B12 level - has had history of low B12 - ?

## 2017-02-24 NOTE — Assessment & Plan Note (Signed)
Not controlled Continue amlodipine 5 mg daily - increasing the dose further will likely increase her leg edema Increase hydralazine to 50 mg TID Continue to monitor BP at home Follow up in one week cmp today

## 2017-02-25 ENCOUNTER — Encounter: Payer: Self-pay | Admitting: Internal Medicine

## 2017-03-05 ENCOUNTER — Encounter: Payer: Self-pay | Admitting: Internal Medicine

## 2017-03-05 ENCOUNTER — Ambulatory Visit: Payer: Medicare HMO

## 2017-03-05 ENCOUNTER — Ambulatory Visit (INDEPENDENT_AMBULATORY_CARE_PROVIDER_SITE_OTHER): Payer: Medicare HMO | Admitting: Internal Medicine

## 2017-03-05 VITALS — BP 168/82 | HR 93 | Temp 99.4°F | Resp 18 | Wt 295.0 lb

## 2017-03-05 DIAGNOSIS — I10 Essential (primary) hypertension: Secondary | ICD-10-CM

## 2017-03-05 DIAGNOSIS — Z23 Encounter for immunization: Secondary | ICD-10-CM

## 2017-03-05 DIAGNOSIS — E538 Deficiency of other specified B group vitamins: Secondary | ICD-10-CM | POA: Diagnosis not present

## 2017-03-05 DIAGNOSIS — I5032 Chronic diastolic (congestive) heart failure: Secondary | ICD-10-CM | POA: Diagnosis not present

## 2017-03-05 MED ORDER — CYANOCOBALAMIN 1000 MCG/ML IJ SOLN
1000.0000 ug | Freq: Once | INTRAMUSCULAR | Status: AC
  Administered 2017-03-05: 1000 ug via INTRAMUSCULAR

## 2017-03-05 NOTE — Assessment & Plan Note (Signed)
Not controlled Increase amlodipine to BID - if swelling increases will need to decrease back to 5 mg daily and restart an ARB Continue hydralazine at current dose Monitor BP at home - she will let me know if it is not controlled

## 2017-03-05 NOTE — Assessment & Plan Note (Signed)
Low B12 level Start monthly B12 injections - first today

## 2017-03-05 NOTE — Progress Notes (Signed)
Subjective:    Patient ID: Mary Acosta, female    DOB: Jan 02, 1956, 61 y.o.   MRN: [id number]  HPI She is here for follow up.  Hypertension: She is taking her medication daily. She is compliant with a low sodium diet.  She denies chest pain, palpitations, and regular headaches. She is not exercising regularly.  She does monitor her blood pressure at home and it has been variable 993'T often systolically.  She does think it has improved.    B12 def:  Her recent blood work showed that her B12 level was low.  She is interested in getting B12 injections.  She has had them in the past and they did help.    She is due for her second hepatitis B vaccine   Medications and allergies reviewed with patient and updated if appropriate.  Patient Active Problem List   Diagnosis Date Noted  . Numbness and tingling in both hands 02/24/2017  . Acute kidney injury ([place]) 01/31/2017  . OSA (obstructive sleep apnea) 01/31/2017  . Liver cirrhosis secondary to NASH ([place]) 01/31/2017  . Hypothyroidism 01/31/2017  . Uncontrolled type 2 diabetes mellitus with insulin therapy ([place]) 01/31/2017  . Dehydration, moderate 01/31/2017  . Chronic narcotic use 01/31/2017  . Fibromyalgia 01/31/2017  . Hyperlipidemia 01/31/2017  . Hypotension 01/31/2017  . Gastroparesis   . Memory difficulty 07/25/2016  . Pain in toe 04/09/2016  . Lump in neck 01/29/2016  . Chronic diastolic (congestive) heart failure ([place]) 01/03/2016  . Fatty liver 01/03/2016  . Type 1 diabetes mellitus ([place]) 01/03/2016  . Recurrent Clostridium difficile diarrhea 01/03/2016  . Cirrhosis of liver without ascites ([place]) 12/07/2015  . Chronic respiratory failure ([place]) 11/10/2014  . Physical deconditioning 09/26/2014  . Dyspnea 09/26/2014  . HCAP (healthcare-associated pneumonia) 08/26/2014  . COPD (chronic obstructive pulmonary disease) ([place]) 08/26/2014  . Iron deficiency anemia, unspecified  04/05/2011  . Bariatric surgery status  04/05/2011  . Left arm pain   . UNSPECIFIED VITAMIN D DEFICIENCY 10/22/2007  . LOW BACK PAIN, CHRONIC 10/22/2007  . INSOMNIA 10/22/2007  . Obesity 10/14/2007  . Chronic pain syndrome 10/14/2007  . CARPAL TUNNEL SYNDROME 10/14/2007  . PERIPHERAL NEUROPATHY 10/14/2007  . ALLERGIC RHINITIS CAUSE UNSPECIFIED 10/14/2007  . ARTHRITIS 10/14/2007  . Hypothyroid 10/14/2007  . Anxiety state 08/25/2007  . Depression 08/25/2007  . Essential hypertension 08/25/2007  . ASTHMA 08/25/2007  . CONSTIPATION 08/25/2007  . POLYMYALGIA RHEUMATICA 08/25/2007  . LEG EDEMA, BILATERAL 08/25/2007    Current Outpatient Prescriptions on File Prior to Visit  Medication Sig Dispense Refill  . albuterol (PROVENTIL HFA;VENTOLIN HFA) 108 (90 BASE) MCG/ACT inhaler Inhale 2 puffs into the lungs every 6 (six) hours as needed for wheezing or shortness of breath. 1 Inhaler 2  . amitriptyline (ELAVIL) 25 MG tablet Take 2 tablets (50 mg total) by mouth at bedtime.    Marland Kitchen amLODipine (NORVASC) 5 MG tablet Take 1 tablet (5 mg total) by mouth daily. 30 tablet 3  . atorvastatin (LIPITOR) 40 MG tablet Take 1 tablet (40 mg total) by mouth daily. 90 tablet 3  . busPIRone (BUSPAR) 7.5 MG tablet Take 1 tablet (7.5 mg total) by mouth 3 (three) times daily. 90 tablet 3  . hydrALAZINE (APRESOLINE) 50 MG tablet Take 1 tablet (50 mg total) by mouth 3 (three) times daily. 90 tablet 5  . HYDROmorphone (DILAUDID) 4 MG tablet Take 1 tablet (4 mg total) by mouth every 8 (eight) hours as needed for moderate pain.    Marland Kitchen  insulin aspart (NOVOLOG) 100 UNIT/ML injection Inject 0-20 Units into the skin 3 (three) times daily with meals. CBG < 70: Eat or drink something sweet and recheck; CBG 70 - 120: 0 units;CBG 121 - 150: 3 units;CBG 151 - 200: 4 units;CBG 201 - 250: 7 units;CBG 251 - 300: 11 units;CBG 301 - 350: 15 units;CBG 351 - 400: 20 units;CBG > 400: call MD. 10 mL 0  . insulin NPH Human (NOVOLIN N) 100 UNIT/ML injection Inject 0.3 mLs (30 Units  total) into the skin 2 (two) times daily before a meal.    . Insulin Syringe-Needle U-100 (INSULIN SYRINGE 1CC/30GX5/16") 30G X 5/16" 1 ML MISC Use as directed for insulin injections 5 times daily 150 each 1  . ipratropium-albuterol (DUONEB) 0.5-2.5 (3) MG/3ML SOLN Take 3 mLs by nebulization every 6 (six) hours as needed (Wheezing or dyspnea.).    Marland Kitchen levothyroxine (SYNTHROID, LEVOTHROID) 175 MCG tablet Take 1 tablet (175 mcg total) by mouth daily before breakfast. 90 tablet 1  . Lidocaine HCl (ASPERCREME LIDOCAINE) 4 % LIQD Apply 1 application topically daily as needed (pain).    . meloxicam (MOBIC) 7.5 MG tablet Take 7.5 mg by mouth 2 (two) times daily.   5  . metoCLOPramide (REGLAN) 5 MG tablet Take 1 tablet (5 mg total) by mouth 3 (three) times daily before meals. 90 tablet 2  . morphine (MS CONTIN) 30 MG 12 hr tablet Take 30 mg by mouth 2 (two) times daily.    Marland Kitchen tiZANidine (ZANAFLEX) 2 MG tablet Take 2 mg by mouth 3 (three) times daily.  2   No current facility-administered medications on file prior to visit.     Past Medical History:  Diagnosis Date  . AKI (acute kidney injury) ([place]) 01/2017  . Anxiety    with panic attacks  . Arthritis    "back; feet; hands; shoulders" (08/26/2014)  . Asthma   . Cervical cancer ([place])   . Chronic lower back pain   . Chronic narcotic use   . Chronic pain syndrome    [facility]  . Cirrhosis ([place])   . Clostridium difficile infection 2017  . COPD (chronic obstructive pulmonary disease) ([place])   . Daily headache   . Depression   . DJD (degenerative joint disease)   . Fatty liver disease, nonalcoholic   . Fibromyalgia   . Frequency of urination   . HCAP (healthcare-associated pneumonia) 08/26/2014  . History of TIA (transient ischemic attack) 11-01-2010   NO RESIDUAL  . Hyperlipidemia   . Hypertension   . Hypothyroidism   . IDDM (insulin dependent diabetes mellitus) ([place])   . Insomnia   . Lumbar stenosis   . Memory difficulty  07/25/2016  . Nocturia   . OSA (obstructive sleep apnea)    NO CPAP SINCE WT LOSS  . Osteoarthritis    with severe disease in knee  . Pneumonia "several times"  . Polymyalgia rheumatica ([place])   . Scoliosis   . Seasonal allergies   . Thyroid cancer ([place])   . Urgency of urination   . Vaginal pain S/P SLING  FEB 2012    Past Surgical History:  Procedure Laterality Date  . APPENDECTOMY  1982  . BREAST LUMPECTOMY Left 02-28-2005   ATYPICAL DUCTAL HYPERPLASIA  . CARDIAC CATHETERIZATION  09-04-2004   NORMAL CORONARY ANATOMY/ NORMAL LVF/ EF 60%  . CARDIOVASCULAR STRESS TEST  12-27-2010  DR [place]   ABNORMAL NUCLEAR STUDY W/ /MILD INFERIOR ISCHEMIA/ EF 69%/  CT  HEART ANGIOGRAM ;  NO ACUTE FINDINGS  . CRYOABLATION  05/16/2003   w/LEEP FOR ABNORMAL PAP SMEAR  . CYSTOSCOPY  05/18/2012   Procedure: CYSTOSCOPY;  Surgeon: Reece Packer, MD;  Location[facility];  Service: Urology;  Laterality: N/A;  examination under anethesia  . ESOPHAGOGASTRODUODENOSCOPY (EGD) WITH PROPOFOL N/A 09/03/2016   Procedure: ESOPHAGOGASTRODUODENOSCOPY (EGD) WITH PROPOFOL;  Surgeon: Doran Stabler, MD;  [facility];  Service: Gastroenterology;  Laterality: N/A;  . HYSTEROSCOPY W/D&C  08-19-2007   PMB  . KNEE ARTHROSCOPY W/ DEBRIDEMENT Left 03/29/2006   INTERNAL DERANGEMENT/ SEVERE DJD/ MENISCUS TEARS  . LAPAROSCOPIC CHOLECYSTECTOMY  06-10-2002  . LAPAROSCOPIC GASTRIC BANDING  03/01/2006   TRUNCAL VAGOTOMY/ PLACEMENT OF VG BAND  . REVISION TOTAL KNEE ARTHROPLASTY Left 08-29-2008; 05/2009  . TONSILLECTOMY  1969  . TOTAL KNEE ARTHROPLASTY Left 01-23-2007   SEVERE DJD  . TOTAL THYROIDECTOMY  11-22-2005   BILATERAL THYROID NODULES-- PAPILLARY CARCINOMA (0.5CM)/ ADENOMATOID NODULES  . TRANSTHORACIC ECHOCARDIOGRAM  12-27-2010   LVSF NORMAL / EF 42-59%/ GRADE I DIASTOLIC DYSFUNCTION/ MILD MITRAL REGURG. / MILDLY DILATED LEFT ATRIUM/ MILDY INCREASED SYSTOLIC PRESSURE OF PULMONARY ARTERIES    . TRANSVAGINAL SUBURETERAL TAPE/ SLING  09-28-2010   MIXED URINARY INCONTINENCE  . TUBAL LIGATION  1983    Social History   Social History  . Marital status: Married    Spouse name: N/A  . Number of children: 2  . Years of education: 12   Occupational History  . disabled Unemployed   Social History Main Topics  . Smoking status: Former Smoker    Packs/day: 2.50    Years: 39.00    Types: Cigarettes    Quit date: 10/22/2010  . Smokeless tobacco: Never Used  . Alcohol use No  . Drug use: No  . Sexual activity: Not Currently   Other Topics Concern  . None   Social History Narrative   Lives at home w/ her husband and grandson   Right-handed   Caffeine: 1 cup of coffee per week + Pepsi    Family History  Problem Relation Age of Onset  . Diabetes Mother   . Heart disease Mother   . Dementia Mother   . Heart disease Father   . High blood pressure Father   . Colon cancer Maternal Uncle        x 2  . Breast cancer Other        great aunts x 5    Review of Systems  Constitutional: Negative for chills and fever.  Respiratory: Positive for shortness of breath (from COPD). Negative for cough and wheezing.   Cardiovascular: Positive for leg swelling (minimal). Negative for chest pain and palpitations.  Neurological: Negative for light-headedness and headaches.       Can feel heart beating inside head when BP is high       Objective:   Vitals:   03/05/17 1548  BP: (!) 168/82  Pulse: 93  Resp: 18  Temp: 99.4 F (37.4 C)   Filed Weights   03/05/17 1548  Weight: 295 lb (133.8 kg)   Body mass index is 43.56 kg/m.  Wt Readings from Last 3 Encounters:  03/05/17 295 lb (133.8 kg)  02/24/17 297 lb (134.7 kg)  02/05/17 293 lb (132.9 kg)     Physical Exam Constitutional: Appears well-developed and well-nourished. No distress.  HENT:  Head: Normocephalic and atraumatic.  Neck: Neck supple. No tracheal deviation present. No thyromegaly present.  No cervical  lymphadenopathy Cardiovascular: Normal rate, regular rhythm and normal heart sounds.   No murmur heard. No carotid bruit .  Trace b/l LE edema with chronic skin changes Pulmonary/Chest: Effort normal and breath sounds normal. No respiratory distress. No has no wheezes. No rales.  Skin: Skin is warm and dry. Not diaphoretic.  Psychiatric: Normal mood and affect. Behavior is normal.        Assessment & Plan:   See Problem List for Assessment and Plan of chronic medical problems.

## 2017-03-05 NOTE — Assessment & Plan Note (Signed)
euvolemic on exam  Will increase norvasc - need to monitor edema Not on a water pill

## 2017-03-05 NOTE — Patient Instructions (Signed)
Increase amlodipine to 5 mg twice a day.  If swelling in legs increases we will need to decrease this back to once a day and add a new medication  Continue the hydralazine three times a day.    You received a B12 injection and hepatitis B vaccine today.

## 2017-03-07 ENCOUNTER — Telehealth: Payer: Self-pay | Admitting: Internal Medicine

## 2017-03-07 NOTE — Telephone Encounter (Signed)
Called and spoke with Sharyn Lull at [facility].  She stated that they faxed over a form for MR to complete send this back to them.  She is aware that MR is out of the office until 7/22.  She will make the pt aware.  EE please advise once this form has been completed for the pt.  thanks

## 2017-03-10 NOTE — Telephone Encounter (Signed)
Checked Elise's forms, this form has not been received. [facility] who is refaxing form to office, verified fax #, at elise's attn.  Will await fax.

## 2017-03-10 NOTE — Telephone Encounter (Signed)
Rx received and placed in MR's look at folder to be signed upon his return to the office.

## 2017-03-11 DIAGNOSIS — J449 Chronic obstructive pulmonary disease, unspecified: Secondary | ICD-10-CM | POA: Diagnosis not present

## 2017-03-17 ENCOUNTER — Other Ambulatory Visit: Payer: Self-pay | Admitting: Gastroenterology

## 2017-03-17 NOTE — Telephone Encounter (Signed)
Doc of the day: Refill request for Reglan 5 mg tabs. TID  Last seen 11-2016 and has a recall follow up for 05-2017. Please advise on refills.

## 2017-03-17 NOTE — Telephone Encounter (Signed)
Yes you can refill it until her next follow up with Dr. Loletha Carrow

## 2017-03-19 DIAGNOSIS — J449 Chronic obstructive pulmonary disease, unspecified: Secondary | ICD-10-CM | POA: Diagnosis not present

## 2017-03-19 NOTE — Telephone Encounter (Signed)
Mary Acosta, was this ever signed yet? Please advise, thanks

## 2017-03-20 NOTE — Telephone Encounter (Signed)
This has been signed and faxed back today. Nothing further needed.

## 2017-03-22 DIAGNOSIS — E119 Type 2 diabetes mellitus without complications: Secondary | ICD-10-CM | POA: Diagnosis not present

## 2017-03-25 DIAGNOSIS — M533 Sacrococcygeal disorders, not elsewhere classified: Secondary | ICD-10-CM | POA: Diagnosis not present

## 2017-03-25 DIAGNOSIS — M1990 Unspecified osteoarthritis, unspecified site: Secondary | ICD-10-CM | POA: Diagnosis not present

## 2017-03-25 DIAGNOSIS — M25562 Pain in left knee: Secondary | ICD-10-CM | POA: Diagnosis not present

## 2017-03-25 DIAGNOSIS — G8929 Other chronic pain: Secondary | ICD-10-CM | POA: Diagnosis not present

## 2017-04-03 ENCOUNTER — Other Ambulatory Visit: Payer: Self-pay | Admitting: Internal Medicine

## 2017-04-04 NOTE — Telephone Encounter (Signed)
Not on current med list.

## 2017-04-04 NOTE — Telephone Encounter (Signed)
Call her-this medication was stopped when she went to the emergency room in June. He came very dehydrated and in her kidney function worsened and her potassium became too elevated. She does need to be careful with this medication. If she feels that she still needs it and it helps we can refill it, but she did not take too much of it.

## 2017-04-08 MED ORDER — POTASSIUM CHLORIDE CRYS ER 20 MEQ PO TBCR: EXTENDED_RELEASE_TABLET | ORAL | 0 refills | Status: DC

## 2017-04-08 NOTE — Telephone Encounter (Signed)
Spoke with pt , she states she only takes as needed and does not have to take it very often. I informed her of MDs note. Please verify that theses RXs are the same, [facility] is requesting Klor Con be sent in.

## 2017-04-11 DIAGNOSIS — J449 Chronic obstructive pulmonary disease, unspecified: Secondary | ICD-10-CM | POA: Diagnosis not present

## 2017-04-14 DIAGNOSIS — J449 Chronic obstructive pulmonary disease, unspecified: Secondary | ICD-10-CM | POA: Diagnosis not present

## 2017-04-15 DIAGNOSIS — Z0289 Encounter for other administrative examinations: Secondary | ICD-10-CM | POA: Diagnosis not present

## 2017-04-15 DIAGNOSIS — M5137 Other intervertebral disc degeneration, lumbosacral region: Secondary | ICD-10-CM | POA: Diagnosis not present

## 2017-04-15 DIAGNOSIS — G894 Chronic pain syndrome: Secondary | ICD-10-CM | POA: Diagnosis not present

## 2017-04-15 DIAGNOSIS — M533 Sacrococcygeal disorders, not elsewhere classified: Secondary | ICD-10-CM | POA: Diagnosis not present

## 2017-04-15 DIAGNOSIS — Z5181 Encounter for therapeutic drug level monitoring: Secondary | ICD-10-CM | POA: Diagnosis not present

## 2017-04-16 NOTE — Addendum Note (Signed)
Addendum  created 04/16/17 1117 by Albertha Ghee, MD   Sign clinical note

## 2017-04-22 ENCOUNTER — Encounter: Payer: Self-pay | Admitting: Gastroenterology

## 2017-04-27 ENCOUNTER — Other Ambulatory Visit: Payer: Self-pay | Admitting: Neurology

## 2017-05-08 ENCOUNTER — Telehealth: Payer: Self-pay | Admitting: Internal Medicine

## 2017-05-08 DIAGNOSIS — J449 Chronic obstructive pulmonary disease, unspecified: Secondary | ICD-10-CM | POA: Diagnosis not present

## 2017-05-08 MED ORDER — AMLODIPINE BESYLATE 5 MG PO TABS: 5.0000 mg | ORAL_TABLET | Freq: Two times a day (BID) | ORAL | 3 refills | Status: DC

## 2017-05-08 NOTE — Telephone Encounter (Signed)
Pt called requesting a refill on amLODipine (NORVASC) 5 MG tablet. She said that it was increased to 2 a day. She would like [facility] to [facility] on [address].

## 2017-05-08 NOTE — Telephone Encounter (Signed)
Reviewed chart MD increase the amlodipine to twice a day (see ov 03/05/17). [facility].../lmb

## 2017-05-12 DIAGNOSIS — J449 Chronic obstructive pulmonary disease, unspecified: Secondary | ICD-10-CM | POA: Diagnosis not present

## 2017-05-16 ENCOUNTER — Telehealth: Payer: Self-pay

## 2017-05-16 NOTE — Telephone Encounter (Addendum)
Prescription done.  Please also make her an appointment to see me within next 2 months.

## 2017-05-19 MED ORDER — METOCLOPRAMIDE HCL 5 MG PO TABS: 5.0000 mg | ORAL_TABLET | Freq: Three times a day (TID) | ORAL | 2 refills | Status: DC

## 2017-05-20 NOTE — Telephone Encounter (Signed)
Left a detailed message to return call and schedule a office visit.

## 2017-05-29 ENCOUNTER — Other Ambulatory Visit: Payer: Self-pay | Admitting: Emergency Medicine

## 2017-05-29 MED ORDER — BUSPIRONE HCL 7.5 MG PO TABS: 7.5000 mg | ORAL_TABLET | Freq: Three times a day (TID) | ORAL | 3 refills | Status: DC

## 2017-05-29 MED ORDER — BUSPIRONE HCL 7.5 MG PO TABS: 7.5000 mg | ORAL_TABLET | Freq: Three times a day (TID) | ORAL | 0 refills | Status: DC

## 2017-05-30 ENCOUNTER — Other Ambulatory Visit: Payer: Self-pay | Admitting: Internal Medicine

## 2017-06-02 ENCOUNTER — Encounter: Payer: Self-pay | Admitting: Internal Medicine

## 2017-06-02 MED ORDER — ALBUTEROL SULFATE HFA 108 (90 BASE) MCG/ACT IN AERS: 2.0000 | INHALATION_SPRAY | Freq: Four times a day (QID) | RESPIRATORY_TRACT | 2 refills | Status: DC | PRN

## 2017-06-04 ENCOUNTER — Ambulatory Visit (INDEPENDENT_AMBULATORY_CARE_PROVIDER_SITE_OTHER): Payer: Medicare HMO | Admitting: Neurology

## 2017-06-04 ENCOUNTER — Other Ambulatory Visit: Payer: Self-pay | Admitting: Neurology

## 2017-06-04 ENCOUNTER — Encounter: Payer: Self-pay | Admitting: Neurology

## 2017-06-04 VITALS — BP 141/83 | HR 88 | Ht 69.0 in | Wt 294.0 lb

## 2017-06-04 DIAGNOSIS — E1142 Type 2 diabetes mellitus with diabetic polyneuropathy: Secondary | ICD-10-CM | POA: Diagnosis not present

## 2017-06-04 DIAGNOSIS — E1140 Type 2 diabetes mellitus with diabetic neuropathy, unspecified: Secondary | ICD-10-CM

## 2017-06-04 DIAGNOSIS — M797 Fibromyalgia: Secondary | ICD-10-CM

## 2017-06-04 HISTORY — DX: Type 2 diabetes mellitus with diabetic neuropathy, unspecified: E11.40

## 2017-06-04 MED ORDER — DONEPEZIL HCL 5 MG PO TABS: 5.0000 mg | ORAL_TABLET | Freq: Every day | ORAL | 1 refills | Status: DC

## 2017-06-04 MED ORDER — AMITRIPTYLINE HCL 50 MG PO TABS: 100.0000 mg | ORAL_TABLET | Freq: Every day | ORAL | Status: DC

## 2017-06-04 NOTE — Progress Notes (Signed)
Reason for visit: Memory disturbance  Mary Acosta is an 61 y.o. female  History of present illness:  Mary Acosta is a 61 year old right-handed white female with a history of obesity, diabetes, and diabetic peripheral neuropathy. The patient has been on amitriptyline taking 100 mg at night for her neuropathy pain. On her last visit, we tried to reduce the dose down to 50 mg at night but the patient had significant increases in discomfort in the legs and she went back up to 100 mg at night. She was placed on Keppra for the discomfort, but she indicated this offered no benefit, and she stopped the drug. She continues to have some memory issues, she does operate a motor vehicle, she has not given up any activities of daily living because of memory. She has been evaluated for sleep apnea, she has had some problems with desaturation of her oxygen at night and she was placed on oxygen in the evening hours. The patient is on opiate medications. She comes to this office for an evaluation.  Past Medical History:  Diagnosis Date  . AKI (acute kidney injury) ([place]) 01/2017  . Anxiety    with panic attacks  . Arthritis    "back; feet; hands; shoulders" (08/26/2014)  . Asthma   . Cervical cancer ([place])   . Chronic lower back pain   . Chronic narcotic use   . Chronic pain syndrome    [facility]  . Cirrhosis ([place])   . Clostridium difficile infection 2017  . COPD (chronic obstructive pulmonary disease) ([place])   . Daily headache   . Depression   . DJD (degenerative joint disease)   . Fatty liver disease, nonalcoholic   . Fibromyalgia   . Frequency of urination   . HCAP (healthcare-associated pneumonia) 08/26/2014  . History of TIA (transient ischemic attack) 11-01-2010   NO RESIDUAL  . Hyperlipidemia   . Hypertension   . Hypothyroidism   . IDDM (insulin dependent diabetes mellitus) ([place])   . Insomnia   . Lumbar stenosis   . Memory difficulty 07/25/2016  . Nocturia    . OSA (obstructive sleep apnea)    NO CPAP SINCE WT LOSS  . Osteoarthritis    with severe disease in knee  . Pneumonia "several times"  . Polymyalgia rheumatica ([place])   . Scoliosis   . Seasonal allergies   . Thyroid cancer ([place])   . Urgency of urination   . Vaginal pain S/P SLING  FEB 2012    Past Surgical History:  Procedure Laterality Date  . APPENDECTOMY  1982  . BREAST LUMPECTOMY Left 02-28-2005   ATYPICAL DUCTAL HYPERPLASIA  . CARDIAC CATHETERIZATION  09-04-2004   NORMAL CORONARY ANATOMY/ NORMAL LVF/ EF 60%  . CARDIOVASCULAR STRESS TEST  12-27-2010  DR [place]   ABNORMAL NUCLEAR STUDY W/ /MILD INFERIOR ISCHEMIA/ EF 69%/  CT HEART ANGIOGRAM ;  NO ACUTE FINDINGS  . CRYOABLATION  05/16/2003   w/LEEP FOR ABNORMAL PAP SMEAR  . CYSTOSCOPY  05/18/2012   Procedure: CYSTOSCOPY;  Surgeon: Reece Packer, MD;  [facility];  Service: Urology;  Laterality: N/A;  examination under anethesia  . ESOPHAGOGASTRODUODENOSCOPY (EGD) WITH PROPOFOL N/A 09/03/2016   Procedure: ESOPHAGOGASTRODUODENOSCOPY (EGD) WITH PROPOFOL;  Surgeon: Doran Stabler, MD;  Location: [facility];  Service: Gastroenterology;  Laterality: N/A;  . HYSTEROSCOPY W/D&C  08-19-2007   PMB  . KNEE ARTHROSCOPY W/ DEBRIDEMENT Left 03/29/2006   INTERNAL DERANGEMENT/ SEVERE  DJD/ MENISCUS TEARS  . LAPAROSCOPIC CHOLECYSTECTOMY  06-10-2002  . LAPAROSCOPIC GASTRIC BANDING  03/01/2006   TRUNCAL VAGOTOMY/ PLACEMENT OF VG BAND  . REVISION TOTAL KNEE ARTHROPLASTY Left 08-29-2008; 05/2009  . TONSILLECTOMY  1969  . TOTAL KNEE ARTHROPLASTY Left 01-23-2007   SEVERE DJD  . TOTAL THYROIDECTOMY  11-22-2005   BILATERAL THYROID NODULES-- PAPILLARY CARCINOMA (0.5CM)/ ADENOMATOID NODULES  . TRANSTHORACIC ECHOCARDIOGRAM  12-27-2010   LVSF NORMAL / EF 73-41%/ GRADE I DIASTOLIC DYSFUNCTION/ MILD MITRAL REGURG. / MILDLY DILATED LEFT ATRIUM/ MILDY INCREASED SYSTOLIC PRESSURE OF PULMONARY ARTERIES  . TRANSVAGINAL  SUBURETERAL TAPE/ SLING  09-28-2010   MIXED URINARY INCONTINENCE  . TUBAL LIGATION  1983    Family History  Problem Relation Age of Onset  . Diabetes Mother   . Heart disease Mother   . Dementia Mother   . Heart disease Father   . High blood pressure Father   . Colon cancer Maternal Uncle        x 2  . Breast cancer Other        great aunts x 5    Social history:  reports that she quit smoking about 6 years ago. Her smoking use included Cigarettes. She has a 97.50 pack-year smoking history. She has never used smokeless tobacco. She reports that she does not drink alcohol or use drugs.    Allergies  Allergen Reactions  . Gabapentin Swelling    Swelling in legs  . Losartan Other (See Comments)    Myalgias and muscle cramping  . Oxycodone Itching  . Sulfa Antibiotics Nausea Only and Rash  . Sulfonamide Derivatives Nausea Only    Medications:  Prior to Admission medications   Medication Sig Start Date End Date Taking? Authorizing Provider  albuterol (PROVENTIL HFA;VENTOLIN HFA) 108 (90 Base) MCG/ACT inhaler Inhale 2 puffs into the lungs every 6 (six) hours as needed for wheezing or shortness of breath. 06/02/17  Yes Brand Males, MD  amitriptyline (ELAVIL) 25 MG tablet Take 2 tablets (50 mg total) by mouth at bedtime. 02/02/17  Yes Hongalgi, Lenis Dickinson, MD  amLODipine (NORVASC) 5 MG tablet Take 1 tablet (5 mg total) by mouth 2 (two) times daily. 05/08/17 08/06/17 Yes Burns, Claudina Lick, MD  atorvastatin (LIPITOR) 40 MG tablet Take 1 tablet (40 mg total) by mouth daily. 09/05/16 09/05/17 Yes Lelon Perla, MD  busPIRone (BUSPAR) 7.5 MG tablet Take 1 tablet (7.5 mg total) by mouth 3 (three) times daily. 05/29/17  Yes Burns, Claudina Lick, MD  hydrALAZINE (APRESOLINE) 50 MG tablet Take 1 tablet (50 mg total) by mouth 3 (three) times daily. 02/24/17  Yes Burns, Claudina Lick, MD  HYDROmorphone (DILAUDID) 4 MG tablet Take 1 tablet (4 mg total) by mouth every 8 (eight) hours as needed for moderate pain.  02/02/17  Yes Hongalgi, Lenis Dickinson, MD  insulin aspart (NOVOLOG) 100 UNIT/ML injection Inject 0-20 Units into the skin 3 (three) times daily with meals. CBG < 70: Eat or drink something sweet and recheck; CBG 70 - 120: 0 units;CBG 121 - 150: 3 units;CBG 151 - 200: 4 units;CBG 201 - 250: 7 units;CBG 251 - 300: 11 units;CBG 301 - 350: 15 units;CBG 351 - 400: 20 units;CBG > 400: call MD. 02/02/17  Yes Hongalgi, Lenis Dickinson, MD  insulin NPH Human (NOVOLIN N) 100 UNIT/ML injection Inject 0.3 mLs (30 Units total) into the skin 2 (two) times daily before a meal. 02/02/17  Yes Hongalgi, Lenis Dickinson, MD  Insulin Syringe-Needle U-100 (INSULIN SYRINGE 1CC/30GX5/16")  30G X 5/16" 1 ML MISC Use as directed for insulin injections 5 times daily 01/29/16  Yes Burns, Claudina Lick, MD  ipratropium-albuterol (DUONEB) 0.5-2.5 (3) MG/3ML SOLN Take 3 mLs by nebulization every 6 (six) hours as needed (Wheezing or dyspnea.). 02/02/17  Yes Hongalgi, Lenis Dickinson, MD  levETIRAcetam (KEPPRA) 500 MG tablet Take 1 tablet (500 mg total) by mouth 2 (two) times daily. 04/29/17  Yes Kathrynn Ducking, MD  levothyroxine (SYNTHROID, LEVOTHROID) 175 MCG tablet TAKE 1 TABLET BY MOUTH EVERY DAY BEFORE BREAKFAST 05/30/17  Yes Burns, Claudina Lick, MD  Lidocaine HCl (ASPERCREME LIDOCAINE) 4 % LIQD Apply 1 application topically daily as needed (pain).   Yes [provider]  meloxicam (MOBIC) 7.5 MG tablet Take 7.5 mg by mouth 2 (two) times daily.  06/30/15  Yes [provider]  metoCLOPramide (REGLAN) 5 MG tablet Take 1 tablet (5 mg total) by mouth 3 (three) times daily before meals. 05/19/17  Yes Danis, Kirke Corin, MD  morphine (MS CONTIN) 30 MG 12 hr tablet Take 30 mg by mouth 2 (two) times daily. 09/12/15  Yes [provider]  Potassium Chloride ER 20 MEQ TBCR TAKE 2 TABLETS EVERY DAY AS NEEDED FOR CRAMPING 04/08/17  Yes Burns, Claudina Lick, MD  potassium chloride SA (K-DUR,KLOR-CON) 20 MEQ tablet Take 2 tablets daily as needed for cramping 04/08/17  Yes  Burns, Claudina Lick, MD  tiZANidine (ZANAFLEX) 2 MG tablet Take 2 mg by mouth 3 (three) times daily. 05/30/15  Yes [provider]    ROS:  Out of a complete 14 system review of symptoms, the patient complains only of the following symptoms, and all other reviewed systems are negative.  Decreased activity, fatigue Light sensitivity, loss of vision Shortness of breath Cold intolerance, heat intolerance, excessive thirst, excessive eating, flushing Insomnia Environmental allergies Bladder spasms, frequency of urination Joint pain, joint swelling, back pain, achy muscles, muscle cramps, walking difficulty Memory loss, numbness, weakness Depression, anxiety   Blood pressure (!) 141/83, pulse 88, height 5\' 9"  (1.753 m), weight 294 lb (133.4 kg).  Physical Exam  General: The patient is alert and cooperative at the time of the examination.The patient is markedly obese.  Skin: No significant peripheral edema is noted.   Neurologic Exam  Mental status: The patient is alert and oriented x 3 at the time of the examination. The Mini-Mental Status Examination done today shows a total score 22/30.   Cranial nerves: Facial symmetry is present. Speech is normal, no aphasia or dysarthria is noted. Extraocular movements are full. Visual fields are full.  Motor: The patient has good strength in all 4 extremities.  Sensory examination: Soft touch sensation is symmetric on the face, arms, and legs.  Coordination: The patient has good finger-nose-finger and heel-to-shin bilaterally.  Gait and station: The patient has a slightly wide-based gait. Tandem gait is unsteady. Romberg is negative. No drift is seen.  Reflexes: Deep tendon reflexes are symmetric, but are depressed.   Assessment/Plan:  1. Memory disorder   2. Diabetes, diabetic peripheral neuropathy   3. Gait disorder   The patient has been unable to reduce the dose of amitriptyline secondary to significant worsening of  peripheral neuropathy pain. The amitriptyline is anticholinergic and may adversely affect the memory. We will give a trial on Aricept at this time, starting at 5 mg at night for month and then go to 10 mg at night. The patient will follow-up in about 6 months.   Jill Alexanders MD  06/04/2017 12:36 PM  [facility] [address] [place], [place] [address]  Phone [phone] Fax [phone]

## 2017-06-04 NOTE — Patient Instructions (Signed)
   We will start Aricept 5 mg at night.  Begin Aricept (donepezil) at 5 mg at night for one month. If this medication is well-tolerated, please call our office and we will call in a prescription for the 10 mg tablets. Look out for side effects that may include nausea, diarrhea, weight loss, or stomach cramps. This medication will also cause a runny nose, therefore there is no need for allergy medications for this purpose.

## 2017-06-11 DIAGNOSIS — J449 Chronic obstructive pulmonary disease, unspecified: Secondary | ICD-10-CM | POA: Diagnosis not present

## 2017-06-12 NOTE — Patient Instructions (Addendum)
Follow up with Dr Loanne Drilling.   Test(s) ordered today. Your results will be released to [place] (or called to you) after review, usually within 72hours after test completion. If any changes need to be made, you will be notified at that same time.  All other Health Maintenance issues reviewed.   All recommended immunizations and age-appropriate screenings are up-to-date or discussed.  Flu immunization administered today.   Medications reviewed and updated.  No changes recommended at this time.  Your prescription(s) have been submitted to your pharmacy. Please take as directed and contact our office if you believe you are having problem(s) with the medication(s).   Please followup in 6 months

## 2017-06-12 NOTE — Progress Notes (Signed)
Subjective:    Patient ID: Mary Acosta, female    DOB: 1955-11-09, 61 y.o.   MRN: [id number]  HPI The patient is here for follow up.  Chronic diastolic heart failure, Hypertension: She is taking her medication daily. She is compliant with a low sodium diet.  She denies chest pain, palpitations, shortness of breath and regular headaches. She is not exercising regularly.  She does monitor her blood pressure at home.    Hyperlipidemia: She is taking her medication daily. She is compliant with a low fat/cholesterol diet. She is not exercising regularly. She denies myalgias.   Diabetes: She is following with Endocrine.  She is taking her medication daily as prescribed. She is compliant with a diabetic diet. She is exercising regularly. She monitors her sugars and they are less than 250. She checks her feet daily and denies foot lesions. She has neuropathy.  She is not up-to-date with an ophthalmology examination.   Hypothyroidism:  She is following with endocrine.  She is taking her medication daily.  She denies any recent changes in energy or weight that are unexplained.   Anxiety: She is taking her medication daily as prescribed. She denies any side effects from the medication. She feels her anxiety is well controlled and she is happy with her current dose of medication.    Medications and allergies reviewed with patient and updated if appropriate.  Patient Active Problem List   Diagnosis Date Noted  . Diabetic neuropathy ([place]) 06/04/2017  . Vitamin B12 deficiency 03/05/2017  . Numbness and tingling in both hands 02/24/2017  . Acute kidney injury ([place]) 01/31/2017  . OSA (obstructive sleep apnea) 01/31/2017  . Liver cirrhosis secondary to NASH ([place]) 01/31/2017  . Hypothyroidism 01/31/2017  . Uncontrolled type 2 diabetes mellitus with insulin therapy ([place]) 01/31/2017  . Dehydration, moderate 01/31/2017  . Chronic narcotic use 01/31/2017  . Fibromyalgia 01/31/2017  .  Hyperlipidemia 01/31/2017  . Hypotension 01/31/2017  . Gastroparesis   . Memory disorder 07/25/2016  . Pain in toe 04/09/2016  . Lump in neck 01/29/2016  . Chronic diastolic (congestive) heart failure ([place]) 01/03/2016  . Fatty liver 01/03/2016  . Type 1 diabetes mellitus ([place]) 01/03/2016  . Recurrent Clostridium difficile diarrhea 01/03/2016  . Cirrhosis of liver without ascites ([place]) 12/07/2015  . Chronic respiratory failure ([place]) 11/10/2014  . Physical deconditioning 09/26/2014  . Dyspnea 09/26/2014  . HCAP (healthcare-associated pneumonia) 08/26/2014  . COPD (chronic obstructive pulmonary disease) ([place]) 08/26/2014  . Iron deficiency anemia, unspecified  04/05/2011  . Bariatric surgery status 04/05/2011  . Left arm pain   . UNSPECIFIED VITAMIN D DEFICIENCY 10/22/2007  . LOW BACK PAIN, CHRONIC 10/22/2007  . INSOMNIA 10/22/2007  . Obesity 10/14/2007  . Chronic pain syndrome 10/14/2007  . CARPAL TUNNEL SYNDROME 10/14/2007  . PERIPHERAL NEUROPATHY 10/14/2007  . ALLERGIC RHINITIS CAUSE UNSPECIFIED 10/14/2007  . ARTHRITIS 10/14/2007  . Hypothyroid 10/14/2007  . Anxiety state 08/25/2007  . Depression 08/25/2007  . Essential hypertension 08/25/2007  . ASTHMA 08/25/2007  . CONSTIPATION 08/25/2007  . POLYMYALGIA RHEUMATICA 08/25/2007  . LEG EDEMA, BILATERAL 08/25/2007    Current Outpatient Prescriptions on File Prior to Visit  Medication Sig Dispense Refill  . albuterol (PROVENTIL HFA;VENTOLIN HFA) 108 (90 Base) MCG/ACT inhaler Inhale 2 puffs into the lungs every 6 (six) hours as needed for wheezing or shortness of breath. 1 Inhaler 2  . amitriptyline (ELAVIL) 50 MG tablet Take 2 tablets (100 mg total) by mouth at bedtime.    Marland Kitchen  amLODipine (NORVASC) 5 MG tablet Take 1 tablet (5 mg total) by mouth 2 (two) times daily. 60 tablet 3  . atorvastatin (LIPITOR) 40 MG tablet Take 1 tablet (40 mg total) by mouth daily. 90 tablet 3  . busPIRone (BUSPAR) 7.5 MG tablet Take 1 tablet (7.5 mg  total) by mouth 3 (three) times daily. 270 tablet 0  . donepezil (ARICEPT) 5 MG tablet Take 1 tablet (5 mg total) by mouth at bedtime. 30 tablet 1  . hydrALAZINE (APRESOLINE) 50 MG tablet Take 1 tablet (50 mg total) by mouth 3 (three) times daily. 90 tablet 5  . HYDROmorphone (DILAUDID) 4 MG tablet Take 1 tablet (4 mg total) by mouth every 8 (eight) hours as needed for moderate pain.    Marland Kitchen insulin aspart (NOVOLOG) 100 UNIT/ML injection Inject 0-20 Units into the skin 3 (three) times daily with meals. CBG < 70: Eat or drink something sweet and recheck; CBG 70 - 120: 0 units;CBG 121 - 150: 3 units;CBG 151 - 200: 4 units;CBG 201 - 250: 7 units;CBG 251 - 300: 11 units;CBG 301 - 350: 15 units;CBG 351 - 400: 20 units;CBG > 400: call MD. 10 mL 0  . insulin NPH Human (NOVOLIN N) 100 UNIT/ML injection Inject 0.3 mLs (30 Units total) into the skin 2 (two) times daily before a meal.    . Insulin Syringe-Needle U-100 (INSULIN SYRINGE 1CC/30GX5/16") 30G X 5/16" 1 ML MISC Use as directed for insulin injections 5 times daily 150 each 1  . ipratropium-albuterol (DUONEB) 0.5-2.5 (3) MG/3ML SOLN Take 3 mLs by nebulization every 6 (six) hours as needed (Wheezing or dyspnea.).    Marland Kitchen levothyroxine (SYNTHROID, LEVOTHROID) 175 MCG tablet TAKE 1 TABLET BY MOUTH EVERY DAY BEFORE BREAKFAST 90 tablet 1  . Lidocaine HCl (ASPERCREME LIDOCAINE) 4 % LIQD Apply 1 application topically daily as needed (pain).    . meloxicam (MOBIC) 7.5 MG tablet Take 7.5 mg by mouth 2 (two) times daily.   5  . metoCLOPramide (REGLAN) 5 MG tablet Take 1 tablet (5 mg total) by mouth 3 (three) times daily before meals. 90 tablet 2  . morphine (MS CONTIN) 30 MG 12 hr tablet Take 30 mg by mouth 2 (two) times daily.    . potassium chloride SA (K-DUR,KLOR-CON) 20 MEQ tablet Take 2 tablets daily as needed for cramping 60 tablet 0  . tiZANidine (ZANAFLEX) 2 MG tablet Take 2 mg by mouth 3 (three) times daily.  2   No current facility-administered medications on  file prior to visit.     Past Medical History:  Diagnosis Date  . AKI (acute kidney injury) ([place]) 01/2017  . Anxiety    with panic attacks  . Arthritis    "back; feet; hands; shoulders" (08/26/2014)  . Asthma   . Cervical cancer ([place])   . Chronic lower back pain   . Chronic narcotic use   . Chronic pain syndrome    [facility]  . Cirrhosis ([place])   . Clostridium difficile infection 2017  . COPD (chronic obstructive pulmonary disease) ([place])   . Daily headache   . Depression   . Diabetic neuropathy ([place]) 06/04/2017  . DJD (degenerative joint disease)   . Fatty liver disease, nonalcoholic   . Fibromyalgia   . Frequency of urination   . HCAP (healthcare-associated pneumonia) 08/26/2014  . History of TIA (transient ischemic attack) 11-01-2010   NO RESIDUAL  . Hyperlipidemia   . Hypertension   . Hypothyroidism   .  IDDM (insulin dependent diabetes mellitus) ([place])   . Insomnia   . Lumbar stenosis   . Memory difficulty 07/25/2016  . Nocturia   . OSA (obstructive sleep apnea)    NO CPAP SINCE WT LOSS  . Osteoarthritis    with severe disease in knee  . Pneumonia "several times"  . Polymyalgia rheumatica ([place])   . Scoliosis   . Seasonal allergies   . Thyroid cancer ([place])   . Urgency of urination   . Vaginal pain S/P SLING  FEB 2012    Past Surgical History:  Procedure Laterality Date  . APPENDECTOMY  1982  . BREAST LUMPECTOMY Left 02-28-2005   ATYPICAL DUCTAL HYPERPLASIA  . CARDIAC CATHETERIZATION  09-04-2004   NORMAL CORONARY ANATOMY/ NORMAL LVF/ EF 60%  . CARDIOVASCULAR STRESS TEST  12-27-2010  DR [place]   ABNORMAL NUCLEAR STUDY W/ /MILD INFERIOR ISCHEMIA/ EF 69%/  CT HEART ANGIOGRAM ;  NO ACUTE FINDINGS  . CRYOABLATION  05/16/2003   w/LEEP FOR ABNORMAL PAP SMEAR  . CYSTOSCOPY  05/18/2012   Procedure: CYSTOSCOPY;  Surgeon: Reece Packer, MD;  [facility];  Service: Urology;  Laterality: N/A;  examination under anethesia  .  ESOPHAGOGASTRODUODENOSCOPY (EGD) WITH PROPOFOL N/A 09/03/2016   Procedure: ESOPHAGOGASTRODUODENOSCOPY (EGD) WITH PROPOFOL;  Surgeon: Doran Stabler, MD;  [facility];  Service: Gastroenterology;  Laterality: N/A;  . HYSTEROSCOPY W/D&C  08-19-2007   PMB  . KNEE ARTHROSCOPY W/ DEBRIDEMENT Left 03/29/2006   INTERNAL DERANGEMENT/ SEVERE DJD/ MENISCUS TEARS  . LAPAROSCOPIC CHOLECYSTECTOMY  06-10-2002  . LAPAROSCOPIC GASTRIC BANDING  03/01/2006   TRUNCAL VAGOTOMY/ PLACEMENT OF VG BAND  . REVISION TOTAL KNEE ARTHROPLASTY Left 08-29-2008; 05/2009  . TONSILLECTOMY  1969  . TOTAL KNEE ARTHROPLASTY Left 01-23-2007   SEVERE DJD  . TOTAL THYROIDECTOMY  11-22-2005   BILATERAL THYROID NODULES-- PAPILLARY CARCINOMA (0.5CM)/ ADENOMATOID NODULES  . TRANSTHORACIC ECHOCARDIOGRAM  12-27-2010   LVSF NORMAL / EF 84-69%/ GRADE I DIASTOLIC DYSFUNCTION/ MILD MITRAL REGURG. / MILDLY DILATED LEFT ATRIUM/ MILDY INCREASED SYSTOLIC PRESSURE OF PULMONARY ARTERIES  . TRANSVAGINAL SUBURETERAL TAPE/ SLING  09-28-2010   MIXED URINARY INCONTINENCE  . TUBAL LIGATION  1983    Social History   Social History  . Marital status: Married    Spouse name: N/A  . Number of children: 2  . Years of education: 12   Occupational History  . disabled Unemployed   Social History Main Topics  . Smoking status: Former Smoker    Packs/day: 2.50    Years: 39.00    Types: Cigarettes    Quit date: 10/22/2010  . Smokeless tobacco: Never Used  . Alcohol use No  . Drug use: No  . Sexual activity: Not Currently   Other Topics Concern  . None   Social History Narrative   Lives at home w/ her husband and grandson   Right-handed   Caffeine: 1 cup of coffee per week + [employer]eps[employer]    Family History  Problem Relation Age of Onset  . Diabetes Mother   . Heart disease Mother   . Dementia Mother   . Heart disease Father   . High blood pressure Father   . Colon cancer Maternal Uncle        x 2  . Breast cancer Other         great aunts x 5    Review of Systems  Constitutional: Negative for chills and fever.  Respiratory: Negative for cough, shortness of  breath and wheezing.   Cardiovascular: Positive for leg swelling. Negative for chest pain and palpitations.  Musculoskeletal: Positive for arthralgias and back pain.  Neurological: Negative for light-headedness and headaches.       Objective:   Vitals:   06/13/17 1121  BP: 110/64  Pulse: 83  Resp: 16  Temp: 98.3 F (36.8 C)  SpO2: 90%   Wt Readings from Last 3 Encounters:  06/13/17 297 lb (134.7 kg)  06/04/17 294 lb (133.4 kg)  03/05/17 295 lb (133.8 kg)   Body mass index is 43.86 kg/m.   Physical Exam    Constitutional: Appears well-developed and well-nourished. No distress.  HENT:  Head: Normocephalic and atraumatic.  Neck: Neck supple. No tracheal deviation present. No thyromegaly present.  No cervical lymphadenopathy Cardiovascular: Normal rate, regular rhythm and normal heart sounds.   No murmur heard. No carotid bruit .  No edema Pulmonary/Chest: Effort normal and breath sounds normal. No respiratory distress. No has no wheezes. No rales.  Skin: Skin is warm and dry. Not diaphoretic.  Psychiatric: Normal mood and affect. Behavior is normal.    Diabetic Foot Exam - Simple   Simple Foot Form Diabetic Foot exam was performed with the following findings:  Yes   Visual Inspection No deformities, no ulcerations, no other skin breakdown bilaterally:  Yes Sensation Testing See comments:  Yes Pulse Check Posterior Tibialis and Dorsalis pulse intact bilaterally:  Yes Comments Decreased monofilament and light touch bilaterally, onychomycosis       Assessment & Plan:    See Problem List for Assessment and Plan of chronic medical problems.

## 2017-06-13 ENCOUNTER — Encounter: Payer: Self-pay | Admitting: Internal Medicine

## 2017-06-13 ENCOUNTER — Other Ambulatory Visit (INDEPENDENT_AMBULATORY_CARE_PROVIDER_SITE_OTHER): Payer: Medicare HMO

## 2017-06-13 ENCOUNTER — Ambulatory Visit (INDEPENDENT_AMBULATORY_CARE_PROVIDER_SITE_OTHER): Payer: Medicare HMO | Admitting: Internal Medicine

## 2017-06-13 VITALS — BP 110/64 | HR 83 | Temp 98.3°F | Resp 16 | Wt 297.0 lb

## 2017-06-13 DIAGNOSIS — E038 Other specified hypothyroidism: Secondary | ICD-10-CM

## 2017-06-13 DIAGNOSIS — IMO0002 Reserved for concepts with insufficient information to code with codable children: Secondary | ICD-10-CM

## 2017-06-13 DIAGNOSIS — E1165 Type 2 diabetes mellitus with hyperglycemia: Secondary | ICD-10-CM

## 2017-06-13 DIAGNOSIS — E1142 Type 2 diabetes mellitus with diabetic polyneuropathy: Secondary | ICD-10-CM | POA: Diagnosis not present

## 2017-06-13 DIAGNOSIS — Z794 Long term (current) use of insulin: Secondary | ICD-10-CM | POA: Diagnosis not present

## 2017-06-13 DIAGNOSIS — R69 Illness, unspecified: Secondary | ICD-10-CM | POA: Diagnosis not present

## 2017-06-13 DIAGNOSIS — Z23 Encounter for immunization: Secondary | ICD-10-CM | POA: Diagnosis not present

## 2017-06-13 DIAGNOSIS — I10 Essential (primary) hypertension: Secondary | ICD-10-CM | POA: Diagnosis not present

## 2017-06-13 DIAGNOSIS — F411 Generalized anxiety disorder: Secondary | ICD-10-CM

## 2017-06-13 DIAGNOSIS — I5032 Chronic diastolic (congestive) heart failure: Secondary | ICD-10-CM | POA: Diagnosis not present

## 2017-06-13 LAB — COMPREHENSIVE METABOLIC PANEL
ALBUMIN: 3.9 g/dL (ref 3.5–5.2)
ALT: 18 U/L (ref 0–35)
AST: 21 U/L (ref 0–37)
Alkaline Phosphatase: 110 U/L (ref 39–117)
BUN: 13 mg/dL (ref 6–23)
CALCIUM: 9.3 mg/dL (ref 8.4–10.5)
CHLORIDE: 93 meq/L — AB (ref 96–112)
CO2: 33 meq/L — AB (ref 19–32)
Creatinine, Ser: 1.04 mg/dL (ref 0.40–1.20)
GFR: 57.26 mL/min — ABNORMAL LOW (ref >60.00)
Glucose, Bld: 441 mg/dL — ABNORMAL HIGH (ref 70–99)
POTASSIUM: 4.8 meq/L (ref 3.5–5.1)
SODIUM: 133 meq/L — AB (ref 135–145)
Total Bilirubin: 0.6 mg/dL (ref 0.2–1.2)
Total Protein: 7.3 g/dL (ref 6.0–8.3)

## 2017-06-13 LAB — CBC WITH DIFFERENTIAL/PLATELET
BASOS PCT: 1.1 % (ref 0.0–3.0)
Basophils Absolute: 0.1 10*3/uL (ref 0.0–0.1)
EOS ABS: 0.2 10*3/uL (ref 0.0–0.7)
EOS PCT: 2.2 % (ref 0.0–5.0)
HEMATOCRIT: 41.1 % (ref 36.0–46.0)
HEMOGLOBIN: 12.8 g/dL (ref 12.0–15.0)
Lymphocytes Relative: 25.3 % (ref 12.0–46.0)
Lymphs Abs: 2 10*3/uL (ref 0.7–4.0)
MCHC: 31.1 g/dL (ref 30.0–36.0)
MCV: 79.8 fl (ref 78.0–100.0)
MONO ABS: 0.5 10*3/uL (ref 0.1–1.0)
Monocytes Relative: 7 % (ref 3.0–12.0)
NEUTROS ABS: 5.1 10*3/uL (ref 1.4–7.7)
Neutrophils Relative %: 64.4 % (ref 43.0–77.0)
PLATELETS: 183 10*3/uL (ref 150.0–400.0)
RBC: 5.16 Mil/uL — ABNORMAL HIGH (ref 3.87–5.11)
RDW: 14.6 % (ref 11.5–15.5)
WBC: 7.9 10*3/uL (ref 4.0–10.5)

## 2017-06-13 LAB — HEMOGLOBIN A1C: HEMOGLOBIN A1C: 10.7 % — AB (ref 4.6–6.5)

## 2017-06-13 LAB — TSH: TSH: 0.62 u[IU]/mL (ref 0.35–4.50)

## 2017-06-13 MED ORDER — HYDRALAZINE HCL 25 MG PO TABS: 25.0000 mg | ORAL_TABLET | Freq: Three times a day (TID) | ORAL | 5 refills | Status: DC

## 2017-06-13 NOTE — Assessment & Plan Note (Signed)
Controlled, stable Continue current dose of medication - buspar

## 2017-06-13 NOTE — Assessment & Plan Note (Signed)
Has not seen endo recently - will make an appointment Check tsh

## 2017-06-13 NOTE — Assessment & Plan Note (Signed)
Has leg edema, but appears euvolemic Continue current medications Work on increasing activity, work on weight loss No change in meds

## 2017-06-13 NOTE — Assessment & Plan Note (Signed)
Decreased sensation in both feet She is checking her feet daily - no current lesions

## 2017-06-13 NOTE — Assessment & Plan Note (Addendum)
BP well controlled Current regimen effective and well tolerated Continue current medications at current doses cmp  

## 2017-06-13 NOTE — Assessment & Plan Note (Signed)
Check a1c Low sugar / carb diet Stressed regular exercise, weight loss Advised her to follow up with Dr Loanne Drilling Will schedule an eye appointment

## 2017-06-15 ENCOUNTER — Encounter: Payer: Self-pay | Admitting: Internal Medicine

## 2017-06-21 DIAGNOSIS — E119 Type 2 diabetes mellitus without complications: Secondary | ICD-10-CM | POA: Diagnosis not present

## 2017-06-30 ENCOUNTER — Telehealth: Payer: Self-pay | Admitting: *Deleted

## 2017-06-30 MED ORDER — DONEPEZIL HCL 10 MG PO TABS: 10.0000 mg | ORAL_TABLET | Freq: Every day | ORAL | 3 refills | Status: DC

## 2017-06-30 NOTE — Telephone Encounter (Signed)
Called and LVM for pt/husband to call to let us know how she is doing on rx Aricept. Want to make sure she is tolerating with no SE. At last visit, Dr Jannifer Franklin started her on low dose at 5mg  and usually after about a month, we increase that dose to 10mg  if she is going well on med.   We received refill request from [facility] [address], [place] for 90 day supply. We can call in increase in dose if she is doing well. Asked her to call back and let us know.

## 2017-06-30 NOTE — Telephone Encounter (Signed)
Pt returned RN's call  Call transferred to RN °

## 2017-06-30 NOTE — Telephone Encounter (Signed)
Pt called office back. I took call from phone staff.  She is tolerating Aricept 5mg  okay, having no SE. She is agreeable to increase dose to 10mg  tablet. Verified pharmacy. Advised we will send in new rx to her pharmacy. Went over [employer] of medication again. She will call if she has further questions/concerns. She has a f/u on 12/03/17 with MM,NP.

## 2017-06-30 NOTE — Telephone Encounter (Signed)
A prescription for Aricept will be called in.

## 2017-07-04 DIAGNOSIS — J449 Chronic obstructive pulmonary disease, unspecified: Secondary | ICD-10-CM | POA: Diagnosis not present

## 2017-07-12 ENCOUNTER — Other Ambulatory Visit: Payer: Self-pay | Admitting: Internal Medicine

## 2017-07-12 DIAGNOSIS — J449 Chronic obstructive pulmonary disease, unspecified: Secondary | ICD-10-CM | POA: Diagnosis not present

## 2017-07-24 DIAGNOSIS — Z5181 Encounter for therapeutic drug level monitoring: Secondary | ICD-10-CM | POA: Diagnosis not present

## 2017-07-24 DIAGNOSIS — G894 Chronic pain syndrome: Secondary | ICD-10-CM | POA: Diagnosis not present

## 2017-07-24 DIAGNOSIS — M533 Sacrococcygeal disorders, not elsewhere classified: Secondary | ICD-10-CM | POA: Diagnosis not present

## 2017-07-24 DIAGNOSIS — Z0289 Encounter for other administrative examinations: Secondary | ICD-10-CM | POA: Diagnosis not present

## 2017-07-24 DIAGNOSIS — M5137 Other intervertebral disc degeneration, lumbosacral region: Secondary | ICD-10-CM | POA: Diagnosis not present

## 2017-07-29 ENCOUNTER — Other Ambulatory Visit: Payer: Self-pay | Admitting: Emergency Medicine

## 2017-07-29 MED ORDER — HYDRALAZINE HCL 25 MG PO TABS: 25.0000 mg | ORAL_TABLET | Freq: Three times a day (TID) | ORAL | 1 refills | Status: DC

## 2017-07-30 DIAGNOSIS — J449 Chronic obstructive pulmonary disease, unspecified: Secondary | ICD-10-CM | POA: Diagnosis not present

## 2017-07-31 ENCOUNTER — Other Ambulatory Visit: Payer: Self-pay | Admitting: Internal Medicine

## 2017-08-06 ENCOUNTER — Other Ambulatory Visit: Payer: Self-pay | Admitting: Emergency Medicine

## 2017-08-06 MED ORDER — AMLODIPINE BESYLATE 5 MG PO TABS: 5.0000 mg | ORAL_TABLET | Freq: Two times a day (BID) | ORAL | 1 refills | Status: DC

## 2017-08-11 DIAGNOSIS — J449 Chronic obstructive pulmonary disease, unspecified: Secondary | ICD-10-CM | POA: Diagnosis not present

## 2017-08-14 ENCOUNTER — Ambulatory Visit (INDEPENDENT_AMBULATORY_CARE_PROVIDER_SITE_OTHER): Payer: Medicare HMO | Admitting: *Deleted

## 2017-08-14 DIAGNOSIS — Z23 Encounter for immunization: Secondary | ICD-10-CM

## 2017-08-14 NOTE — Progress Notes (Signed)
Last Hep B inj.

## 2017-08-15 ENCOUNTER — Other Ambulatory Visit: Payer: Self-pay | Admitting: Emergency Medicine

## 2017-08-15 MED ORDER — POTASSIUM CHLORIDE CRYS ER 20 MEQ PO TBCR: EXTENDED_RELEASE_TABLET | ORAL | 0 refills | Status: DC

## 2017-08-22 ENCOUNTER — Encounter: Payer: Self-pay | Admitting: Internal Medicine

## 2017-08-22 DIAGNOSIS — J449 Chronic obstructive pulmonary disease, unspecified: Secondary | ICD-10-CM | POA: Diagnosis not present

## 2017-08-27 ENCOUNTER — Other Ambulatory Visit: Payer: Self-pay | Admitting: Emergency Medicine

## 2017-08-27 MED ORDER — LEVOTHYROXINE SODIUM 175 MCG PO TABS: ORAL_TABLET | ORAL | 1 refills | Status: DC

## 2017-08-29 ENCOUNTER — Other Ambulatory Visit: Payer: Self-pay | Admitting: Gastroenterology

## 2017-09-11 DIAGNOSIS — J449 Chronic obstructive pulmonary disease, unspecified: Secondary | ICD-10-CM | POA: Diagnosis not present

## 2017-09-13 ENCOUNTER — Other Ambulatory Visit: Payer: Self-pay | Admitting: Cardiology

## 2017-09-18 ENCOUNTER — Ambulatory Visit: Payer: Self-pay | Admitting: *Deleted

## 2017-09-18 DIAGNOSIS — R002 Palpitations: Secondary | ICD-10-CM | POA: Insufficient documentation

## 2017-09-18 NOTE — Progress Notes (Signed)
Subjective:    Patient ID: Mary Acosta, female    DOB: 1955/10/13, 62 y.o.   MRN: [id number]  HPI The patient is here for follow up.  Vibration in chest: She has been experiencing a quiver/vibration in her chest the past week or so.  It is constant.  Laying down the vibration was worse.  It has gotten better, but it is still there.  Her heart rate at home is been 56-100 and her blood pressure 130-140/70-80.  She states the palpitations feel like a shaking inside of her but does not stop.  She states the vibration is not related to anxiety.  Her anxiety is well controlled. The only change in medications was her changing the amitriptyline to gabapentin.  She wondered if the gabapentin could cause it.  Urine order: Recently she has noticed that her urine has a strong odor.  She has not had any pain with urination or seeing any blood in urine.  She denies any fevers or chills.  She was concerned about a possible infection.  Hypertension: She is taking her medication daily. She is compliant with a low sodium diet.     She does monitor her blood pressure at home.    Diabetes: She is taking her medication daily as prescribed. She is more compliant with a diabetic diet, but could do better.  She monitors her sugars and they have been running 250 or higher.  She is not seeing endocrine for a while and at this point does not want to.  She does not feel that they will we will get her sugars any lower and does not understand why she should go.    Medications and allergies reviewed with patient and updated if appropriate.  Patient Active Problem List   Diagnosis Date Noted  . Palpitations 09/18/2017  . Diabetic neuropathy ([place]) 06/04/2017  . Vitamin B12 deficiency 03/05/2017  . Numbness and tingling in both hands 02/24/2017  . Acute kidney injury ([place]) 01/31/2017  . OSA (obstructive sleep apnea) 01/31/2017  . Liver cirrhosis secondary to NASH ([place]) 01/31/2017  . Hypothyroidism  01/31/2017  . Uncontrolled type 2 diabetes mellitus with insulin therapy ([place]) 01/31/2017  . Dehydration, moderate 01/31/2017  . Chronic narcotic use 01/31/2017  . Fibromyalgia 01/31/2017  . Hyperlipidemia 01/31/2017  . Hypotension 01/31/2017  . Gastroparesis   . Memory disorder 07/25/2016  . Pain in toe 04/09/2016  . Lump in neck 01/29/2016  . Chronic diastolic (congestive) heart failure ([place]) 01/03/2016  . Fatty liver 01/03/2016  . Type 1 diabetes mellitus ([place]) 01/03/2016  . Recurrent Clostridium difficile diarrhea 01/03/2016  . Cirrhosis of liver without ascites ([place]) 12/07/2015  . Chronic respiratory failure ([place]) 11/10/2014  . Physical deconditioning 09/26/2014  . Dyspnea 09/26/2014  . HCAP (healthcare-associated pneumonia) 08/26/2014  . COPD (chronic obstructive pulmonary disease) ([place]) 08/26/2014  . Iron deficiency anemia, unspecified  04/05/2011  . Bariatric surgery status 04/05/2011  . Left arm pain   . UNSPECIFIED VITAMIN D DEFICIENCY 10/22/2007  . LOW BACK PAIN, CHRONIC 10/22/2007  . INSOMNIA 10/22/2007  . Obesity 10/14/2007  . Chronic pain syndrome 10/14/2007  . CARPAL TUNNEL SYNDROME 10/14/2007  . PERIPHERAL NEUROPATHY 10/14/2007  . ALLERGIC RHINITIS CAUSE UNSPECIFIED 10/14/2007  . ARTHRITIS 10/14/2007  . Hypothyroid 10/14/2007  . Anxiety state 08/25/2007  . Depression 08/25/2007  . Essential hypertension 08/25/2007  . ASTHMA 08/25/2007  . CONSTIPATION 08/25/2007  . POLYMYALGIA RHEUMATICA 08/25/2007  . LEG EDEMA, BILATERAL 08/25/2007    Current  Outpatient Medications on File Prior to Visit  Medication Sig Dispense Refill  . albuterol (PROVENTIL HFA;VENTOLIN HFA) 108 (90 Base) MCG/ACT inhaler Inhale 2 puffs into the lungs every 6 (six) hours as needed for wheezing or shortness of breath. 1 Inhaler 2  . amitriptyline (ELAVIL) 50 MG tablet Take 2 tablets (100 mg total) by mouth at bedtime.    Marland Kitchen amLODipine (NORVASC) 5 MG tablet Take 1 tablet (5 mg total) by  mouth 2 (two) times daily. 180 tablet 1  . atorvastatin (LIPITOR) 40 MG tablet TAKE 1 TABLET BY MOUTH EVERY DAY 90 tablet 3  . busPIRone (BUSPAR) 7.5 MG tablet Take 1 tablet (7.5 mg total) by mouth 3 (three) times daily. 270 tablet 0  . donepezil (ARICEPT) 10 MG tablet Take 1 tablet (10 mg total) at bedtime by mouth. 90 tablet 3  . hydrALAZINE (APRESOLINE) 25 MG tablet Take 1 tablet (25 mg total) by mouth 3 (three) times daily. 270 tablet 1  . HYDROmorphone (DILAUDID) 4 MG tablet Take 1 tablet (4 mg total) by mouth every 8 (eight) hours as needed for moderate pain.    Marland Kitchen insulin aspart (NOVOLOG) 100 UNIT/ML injection Inject 0-20 Units into the skin 3 (three) times daily with meals. CBG < 70: Eat or drink something sweet and recheck; CBG 70 - 120: 0 units;CBG 121 - 150: 3 units;CBG 151 - 200: 4 units;CBG 201 - 250: 7 units;CBG 251 - 300: 11 units;CBG 301 - 350: 15 units;CBG 351 - 400: 20 units;CBG > 400: call MD. 10 mL 0  . insulin NPH Human (NOVOLIN N) 100 UNIT/ML injection Inject 0.3 mLs (30 Units total) into the skin 2 (two) times daily before a meal.    . Insulin Syringe-Needle U-100 (INSULIN SYRINGE 1CC/30GX5/16") 30G X 5/16" 1 ML MISC Use as directed for insulin injections 5 times daily 150 each 1  . ipratropium-albuterol (DUONEB) 0.5-2.5 (3) MG/3ML SOLN Take 3 mLs by nebulization every 6 (six) hours as needed (Wheezing or dyspnea.).    Marland Kitchen levothyroxine (SYNTHROID, LEVOTHROID) 175 MCG tablet TAKE 1 TABLET BY MOUTH EVERY DAY BEFORE BREAKFAST 90 tablet 1  . Lidocaine HCl (ASPERCREME LIDOCAINE) 4 % LIQD Apply 1 application topically daily as needed (pain).    . meloxicam (MOBIC) 7.5 MG tablet Take 7.5 mg by mouth 2 (two) times daily.   5  . metoCLOPramide (REGLAN) 5 MG tablet Take 1 tablet (5 mg total) by mouth 3 (three) times daily before meals. 90 tablet 2  . morphine (MS CONTIN) 30 MG 12 hr tablet Take 30 mg by mouth 2 (two) times daily.    . potassium chloride SA (KLOR-CON M20) 20 MEQ tablet TAKE  2 TABLETS EVERY DAY AS NEEDED FOR CRAMPING 180 tablet 0  . tiZANidine (ZANAFLEX) 2 MG tablet Take 2 mg by mouth 3 (three) times daily.  2   No current facility-administered medications on file prior to visit.     Past Medical History:  Diagnosis Date  . AKI (acute kidney injury) ([place]) 01/2017  . Anxiety    with panic attacks  . Arthritis    "back; feet; hands; shoulders" (08/26/2014)  . Asthma   . Cervical cancer ([place])   . Chronic lower back pain   . Chronic narcotic use   . Chronic pain syndrome    [facility]  . Cirrhosis ([place])   . Clostridium difficile infection 2017  . COPD (chronic obstructive pulmonary disease) ([place])   . Daily headache   .  Depression   . Diabetic neuropathy ([place]) 06/04/2017  . DJD (degenerative joint disease)   . Fatty liver disease, nonalcoholic   . Fibromyalgia   . Frequency of urination   . HCAP (healthcare-associated pneumonia) 08/26/2014  . History of TIA (transient ischemic attack) 11-01-2010   NO RESIDUAL  . Hyperlipidemia   . Hypertension   . Hypothyroidism   . IDDM (insulin dependent diabetes mellitus) ([place])   . Insomnia   . Lumbar stenosis   . Memory difficulty 07/25/2016  . Nocturia   . OSA (obstructive sleep apnea)    NO CPAP SINCE WT LOSS  . Osteoarthritis    with severe disease in knee  . Pneumonia "several times"  . Polymyalgia rheumatica ([place])   . Scoliosis   . Seasonal allergies   . Thyroid cancer ([place])   . Urgency of urination   . Vaginal pain S/P SLING  FEB 2012    Past Surgical History:  Procedure Laterality Date  . APPENDECTOMY  1982  . BREAST LUMPECTOMY Left 02-28-2005   ATYPICAL DUCTAL HYPERPLASIA  . CARDIAC CATHETERIZATION  09-04-2004   NORMAL CORONARY ANATOMY/ NORMAL LVF/ EF 60%  . CARDIOVASCULAR STRESS TEST  12-27-2010  DR [place]   ABNORMAL NUCLEAR STUDY W/ /MILD INFERIOR ISCHEMIA/ EF 69%/  CT HEART ANGIOGRAM ;  NO ACUTE FINDINGS  . CRYOABLATION  05/16/2003   w/LEEP FOR ABNORMAL PAP SMEAR  .  CYSTOSCOPY  05/18/2012   Procedure: CYSTOSCOPY;  Surgeon: Reece Packer, MD;  Location: [facility];  Service: Urology;  Laterality: N/A;  examination under anethesia  . ESOPHAGOGASTRODUODENOSCOPY (EGD) WITH PROPOFOL N/A 09/03/2016   Procedure: ESOPHAGOGASTRODUODENOSCOPY (EGD) WITH PROPOFOL;  Surgeon: Doran Stabler, MD;  Location: [facility];  Service: Gastroenterology;  Laterality: N/A;  . HYSTEROSCOPY W/D&C  08-19-2007   PMB  . KNEE ARTHROSCOPY W/ DEBRIDEMENT Left 03/29/2006   INTERNAL DERANGEMENT/ SEVERE DJD/ MENISCUS TEARS  . LAPAROSCOPIC CHOLECYSTECTOMY  06-10-2002  . LAPAROSCOPIC GASTRIC BANDING  03/01/2006   TRUNCAL VAGOTOMY/ PLACEMENT OF VG BAND  . REVISION TOTAL KNEE ARTHROPLASTY Left 08-29-2008; 05/2009  . TONSILLECTOMY  1969  . TOTAL KNEE ARTHROPLASTY Left 01-23-2007   SEVERE DJD  . TOTAL THYROIDECTOMY  11-22-2005   BILATERAL THYROID NODULES-- PAPILLARY CARCINOMA (0.5CM)/ ADENOMATOID NODULES  . TRANSTHORACIC ECHOCARDIOGRAM  12-27-2010   LVSF NORMAL / EF 46-96%/ GRADE I DIASTOLIC DYSFUNCTION/ MILD MITRAL REGURG. / MILDLY DILATED LEFT ATRIUM/ MILDY INCREASED SYSTOLIC PRESSURE OF PULMONARY ARTERIES  . TRANSVAGINAL SUBURETERAL TAPE/ SLING  09-28-2010   MIXED URINARY INCONTINENCE  . TUBAL LIGATION  1983    Social History   Socioeconomic History  . Marital status: Married    Spouse name: None  . Number of children: 2  . Years of education: 29  . Highest education level: None  Social Needs  . Financial resource strain: None  . Food insecurity - worry: None  . Food insecurity - inability: None  . Transportation needs - medical: None  . Transportation needs - non-medical: None  Occupational History  . Occupation: disabled    Fish farm manager: UNEMPLOYED  Tobacco Use  . Smoking status: Former Smoker    Packs/day: 2.50    Years: 39.00    Pack years: 97.50    Types: Cigarettes    Last attempt to quit: 10/22/2010    Years since quitting: 6.9  . Smokeless  tobacco: Never Used  Substance and Sexual Activity  . Alcohol use: No    Alcohol/week: 0.0 oz  . Drug use:  No  . Sexual activity: Not Currently  Other Topics Concern  . None  Social History Narrative   Lives at home w/ her husband and grandson   Right-handed   Caffeine: 1 cup of coffee per week + [employer]    Family History  Problem Relation Age of Onset  . Diabetes Mother   . Heart disease Mother   . Dementia Mother   . Heart disease Father   . High blood pressure Father   . Colon cancer Maternal Uncle        x 2  . Breast cancer Other        great aunts x 5    Review of Systems  Constitutional: Negative for chills and fever.  Respiratory: Negative for cough, shortness of breath and wheezing.   Cardiovascular: Positive for leg swelling (mild). Negative for chest pain and palpitations.  Endocrine: Positive for cold intolerance.  Genitourinary: Negative for dysuria and hematuria.  Neurological: Negative for tremors, light-headedness and headaches.  Psychiatric/Behavioral: The patient is nervous/anxious (controlled).        Objective:   Vitals:   09/19/17 1336  BP: (!) 166/90  Pulse: 76  Resp: 16  Temp: 98.7 F (37.1 C)  SpO2: 96%   Wt Readings from Last 3 Encounters:  09/19/17 291 lb (132 kg)  06/13/17 297 lb (134.7 kg)  06/04/17 294 lb (133.4 kg)   Body mass index is 42.97 kg/m.   Physical Exam    Constitutional: Appears well-developed and well-nourished. No distress.  HENT:  Head: Normocephalic and atraumatic.  Neck: Neck supple. No tracheal deviation present. No thyromegaly present.  No cervical lymphadenopathy Cardiovascular: Normal rate, regular rhythm and normal heart sounds.   No murmur heard. No carotid bruit .  Trace bilateral lower extremity edema Pulmonary/Chest: Effort normal and breath sounds normal. No respiratory distress. No has no wheezes. No rales.  Abdomen: Soft, obese, nontender Skin: Skin is warm and dry. Not diaphoretic.    Psychiatric: Normal mood and affect. Behavior is normal.      Assessment & Plan:    See Problem List for Assessment and Plan of chronic medical problems.

## 2017-09-18 NOTE — Telephone Encounter (Signed)
Pt called complaining of "quivering in her chest for the past week and it dosen't stop"; she states that her heart rate (56-100 bpm) and blood pressure  (130-140/70-80)have been normal; the pt says that the only thing different is that she is taking neurontin and has stopped taking amiltriptyline 3 weeks; pt says "it's like shaking on the inside and it don't stop"; pt also says that she "can hear her heartbeat in her head but that is not unusual" for her; pt is also concerned that her "urine has been smelling like sulfur for the past 2-3 months"; per nurse triage recommendations; pt instructed to see physician within 3 days; pt offered and accepted appointment with Dr Billey Gosling 09/19/17 at 1345; pt verbalizes understanding.   Reason for Disposition . History of hyperthyroidism or taking thyroid medication  Answer Assessment - Initial Assessment Questions 1. DESCRIPTION: "Please describe your heart rate or heart beat that you are having" (e.g., fast/slow, regular/irregular, skipped or extra beats, "palpitations")     palpitations 2. ONSET: "When did it start?" (Minutes, hours or days)      Days 09/11/17 3. DURATION: "How long does it last" (e.g., seconds, minutes, hours)     constant 4. PATTERN "Does it come and go, or has it been constant since it started?"  "Does it get worse with exertion?"   "Are you feeling it now?"     constant 5. TAP: "Using your hand, can you tap out what you are feeling on a chair or table in front of you, so that I can hear?" (Note: not all patients can do this)       unable 6. HEART RATE: "Can you tell me your heart rate?" "How many beats in 15 seconds?"  (Note: not all patients can do this)       16 beats in 15 seconds 7. RECURRENT SYMPTOM: "Have you ever had this before?" If so, ask: "When was the last time?" and "What happened that time?"      no 8. CAUSE: "What do you think is causing the palpitations?"     unsure 9. CARDIAC HISTORY: "Do you have any history of  heart disease?" (e.g., heart attack, angina, bypass surgery, angioplasty, arrhythmia)      yes 10. OTHER SYMPTOMS: "Do you have any other symptoms?" (e.g., dizziness, chest pain, sweating, difficulty breathing)       "Urine smells like sulfur for the past 2-3 months" 11. PREGNANCY: "Is there any chance you are pregnant?" "When was your last menstrual period?"       no  Protocols used: HEART RATE AND HEARTBEAT QUESTIONS-A-AH

## 2017-09-19 ENCOUNTER — Ambulatory Visit (INDEPENDENT_AMBULATORY_CARE_PROVIDER_SITE_OTHER): Payer: Medicare HMO | Admitting: Internal Medicine

## 2017-09-19 ENCOUNTER — Other Ambulatory Visit (INDEPENDENT_AMBULATORY_CARE_PROVIDER_SITE_OTHER): Payer: Medicare HMO

## 2017-09-19 ENCOUNTER — Encounter: Payer: Self-pay | Admitting: Internal Medicine

## 2017-09-19 VITALS — BP 166/90 | HR 76 | Temp 98.7°F | Resp 16 | Wt 291.0 lb

## 2017-09-19 DIAGNOSIS — E1042 Type 1 diabetes mellitus with diabetic polyneuropathy: Secondary | ICD-10-CM

## 2017-09-19 DIAGNOSIS — E039 Hypothyroidism, unspecified: Secondary | ICD-10-CM

## 2017-09-19 DIAGNOSIS — R8290 Unspecified abnormal findings in urine: Secondary | ICD-10-CM | POA: Diagnosis not present

## 2017-09-19 DIAGNOSIS — R002 Palpitations: Secondary | ICD-10-CM

## 2017-09-19 DIAGNOSIS — I10 Essential (primary) hypertension: Secondary | ICD-10-CM

## 2017-09-19 LAB — URINALYSIS, ROUTINE W REFLEX MICROSCOPIC
Bilirubin Urine: NEGATIVE
HGB URINE DIPSTICK: NEGATIVE
Ketones, ur: NEGATIVE
LEUKOCYTES UA: NEGATIVE
Nitrite: NEGATIVE
RBC / HPF: NONE SEEN (ref 0–?)
Specific Gravity, Urine: 1.015 (ref 1.000–1.030)
Total Protein, Urine: NEGATIVE
UROBILINOGEN UA: 0.2 (ref 0.0–1.0)
pH: 6 (ref 5.0–8.0)

## 2017-09-19 LAB — COMPREHENSIVE METABOLIC PANEL
ALBUMIN: 3.7 g/dL (ref 3.5–5.2)
ALT: 13 U/L (ref 0–35)
AST: 22 U/L (ref 0–37)
Alkaline Phosphatase: 104 U/L (ref 39–117)
BUN: 10 mg/dL (ref 6–23)
CALCIUM: 9 mg/dL (ref 8.4–10.5)
CHLORIDE: 95 meq/L — AB (ref 96–112)
CO2: 34 meq/L — AB (ref 19–32)
CREATININE: 0.87 mg/dL (ref 0.40–1.20)
GFR: 70.29 mL/min (ref >60.00)
Glucose, Bld: 312 mg/dL — ABNORMAL HIGH (ref 70–99)
Potassium: 4.1 mEq/L (ref 3.5–5.1)
Sodium: 137 mEq/L (ref 135–145)
Total Bilirubin: 0.5 mg/dL (ref 0.2–1.2)
Total Protein: 7 g/dL (ref 6.0–8.3)

## 2017-09-19 LAB — CBC WITH DIFFERENTIAL/PLATELET
BASOS ABS: 0.1 10*3/uL (ref 0.0–0.1)
BASOS PCT: 0.8 % (ref 0.0–3.0)
EOS ABS: 0.2 10*3/uL (ref 0.0–0.7)
Eosinophils Relative: 2.8 % (ref 0.0–5.0)
HEMATOCRIT: 40.1 % (ref 36.0–46.0)
HEMOGLOBIN: 12.7 g/dL (ref 12.0–15.0)
LYMPHS PCT: 23.8 % (ref 12.0–46.0)
Lymphs Abs: 1.8 10*3/uL (ref 0.7–4.0)
MCHC: 31.7 g/dL (ref 30.0–36.0)
MCV: 75.9 fl — AB (ref 78.0–100.0)
MONOS PCT: 4.9 % (ref 3.0–12.0)
Monocytes Absolute: 0.4 10*3/uL (ref 0.1–1.0)
NEUTROS ABS: 5 10*3/uL (ref 1.4–7.7)
Neutrophils Relative %: 67.7 % (ref 43.0–77.0)
PLATELETS: 173 10*3/uL (ref 150.0–400.0)
RBC: 5.28 Mil/uL — ABNORMAL HIGH (ref 3.87–5.11)
RDW: 15.6 % — AB (ref 11.5–15.5)
WBC: 7.4 10*3/uL (ref 4.0–10.5)

## 2017-09-19 LAB — TSH: TSH: 0.22 u[IU]/mL — AB (ref 0.35–4.50)

## 2017-09-19 LAB — HEMOGLOBIN A1C: Hgb A1c MFr Bld: 11.9 % — ABNORMAL HIGH (ref 4.6–6.5)

## 2017-09-19 LAB — MICROALBUMIN / CREATININE URINE RATIO
CREATININE, U: 103 mg/dL
Microalb Creat Ratio: 0.7 mg/g (ref 0.0–30.0)

## 2017-09-19 NOTE — Patient Instructions (Addendum)
  Test(s) ordered today. Your results will be released to [facility] (or called to you) after review, usually within 72hours after test completion. If any changes need to be made, you will be notified at that same time.  An EKG was done today.   Medications reviewed and updated.  No changes recommended at this time.  Call if you continue to feel the vibration sense.    You should see an endocrinolist for your diabetes.   Schedule your mammogram and your eye appointment.    Call and schedule your mammogram -  [facility] 7 a.m.-6:30 p.m., Monday 7 a.m.-5 p.m., Tuesday-Friday Schedule an appointment by calling [phone]   Follow up in 6 months

## 2017-09-20 ENCOUNTER — Encounter: Payer: Self-pay | Admitting: Internal Medicine

## 2017-09-20 DIAGNOSIS — E119 Type 2 diabetes mellitus without complications: Secondary | ICD-10-CM | POA: Diagnosis not present

## 2017-09-20 DIAGNOSIS — R8290 Unspecified abnormal findings in urine: Secondary | ICD-10-CM | POA: Insufficient documentation

## 2017-09-20 NOTE — Assessment & Plan Note (Addendum)
Sugars not controlled Stressed the importance of her following up with endocrine Continue insulin and monitoring her sugars closely Continue working on improving diet Increase activity Check A1c

## 2017-09-20 NOTE — Assessment & Plan Note (Signed)
Check TSH ?  Cause of vibration sense in chest We will adjust medication if needed

## 2017-09-20 NOTE — Assessment & Plan Note (Signed)
Blood pressure elevated here today, but she states it is much better controlled at home Continue to monitor closely at home-may need to bring cuff into the office to make sure it is accurate Continue current medications at current doses

## 2017-09-20 NOTE — Assessment & Plan Note (Signed)
Need to rule out infection Urinalysis, urine culture

## 2017-09-20 NOTE — Assessment & Plan Note (Signed)
Experiencing palpitations/more of a vibration sense in her chest.  It has improved and now seems to be more intermittent Check basic blood work including TSH, CBC, CMP EKG today:  Undetermined rhythm at 56 bpm, first-degree AV block, right bundle branch block, no change compared to prior EKG It is getting better so if w/u is all normal will just monitor - may be related to gabapentin

## 2017-09-22 ENCOUNTER — Other Ambulatory Visit: Payer: Self-pay | Admitting: Internal Medicine

## 2017-09-22 DIAGNOSIS — J449 Chronic obstructive pulmonary disease, unspecified: Secondary | ICD-10-CM | POA: Diagnosis not present

## 2017-09-22 LAB — URINE CULTURE
MICRO NUMBER: 90108839
SPECIMEN QUALITY:: ADEQUATE

## 2017-09-22 MED ORDER — LEVOTHYROXINE SODIUM 175 MCG PO TABS: ORAL_TABLET | ORAL | 1 refills | Status: DC

## 2017-09-22 MED ORDER — AMOXICILLIN-POT CLAVULANATE 875-125 MG PO TABS: 1.0000 | ORAL_TABLET | Freq: Two times a day (BID) | ORAL | 0 refills | Status: DC

## 2017-09-23 ENCOUNTER — Encounter: Payer: Self-pay | Admitting: Internal Medicine

## 2017-09-24 ENCOUNTER — Ambulatory Visit (INDEPENDENT_AMBULATORY_CARE_PROVIDER_SITE_OTHER): Payer: Medicare HMO | Admitting: Acute Care

## 2017-09-24 ENCOUNTER — Ambulatory Visit: Payer: Medicare HMO | Admitting: Physician Assistant

## 2017-09-24 ENCOUNTER — Encounter: Payer: Self-pay | Admitting: Acute Care

## 2017-09-24 ENCOUNTER — Encounter: Payer: Self-pay | Admitting: Physician Assistant

## 2017-09-24 VITALS — BP 168/98 | HR 85 | Ht 69.0 in | Wt 286.4 lb

## 2017-09-24 VITALS — BP 168/102 | HR 98 | Ht 69.0 in | Wt 286.0 lb

## 2017-09-24 DIAGNOSIS — I10 Essential (primary) hypertension: Secondary | ICD-10-CM

## 2017-09-24 DIAGNOSIS — E039 Hypothyroidism, unspecified: Secondary | ICD-10-CM

## 2017-09-24 DIAGNOSIS — E662 Morbid (severe) obesity with alveolar hypoventilation: Secondary | ICD-10-CM

## 2017-09-24 DIAGNOSIS — I4510 Unspecified right bundle-branch block: Secondary | ICD-10-CM | POA: Diagnosis not present

## 2017-09-24 DIAGNOSIS — E785 Hyperlipidemia, unspecified: Secondary | ICD-10-CM

## 2017-09-24 DIAGNOSIS — I440 Atrioventricular block, first degree: Secondary | ICD-10-CM

## 2017-09-24 DIAGNOSIS — IMO0001 Reserved for inherently not codable concepts without codable children: Secondary | ICD-10-CM

## 2017-09-24 DIAGNOSIS — Z794 Long term (current) use of insulin: Secondary | ICD-10-CM

## 2017-09-24 DIAGNOSIS — J9611 Chronic respiratory failure with hypoxia: Secondary | ICD-10-CM

## 2017-09-24 DIAGNOSIS — E119 Type 2 diabetes mellitus without complications: Secondary | ICD-10-CM

## 2017-09-24 DIAGNOSIS — J449 Chronic obstructive pulmonary disease, unspecified: Secondary | ICD-10-CM

## 2017-09-24 MED ORDER — HYDRALAZINE HCL 50 MG PO TABS: 50.0000 mg | ORAL_TABLET | Freq: Three times a day (TID) | ORAL | 5 refills | Status: DC

## 2017-09-24 NOTE — Patient Instructions (Addendum)
It is nice to meet you. We will request a home sleep study We will request that they hook her up as soon as she arrives so she does not need to be woken up. Continue wearing oxygen at night at 3 L [place]. Follow up with Dr. Chase Caller or Judson Roch after sleep study. Referral to Pulmonary rehab ( Dr. Chase Caller) We will order PFT's>> evaluate for need for maintenance inhaler Continue Duonebs and rescue inhaler as needed. Please contact office for sooner follow up if symptoms do not improve or worsen or seek emergency care

## 2017-09-24 NOTE — Assessment & Plan Note (Addendum)
Continue Duonebs up to 4 times daily for wheezing and dyspnea We will order PFT's to evaluate for worsening COPD need for maintenance medication Continue albuterol inhaler as needed  Follow up with Dr. [place] Desanctis after Sleep Study/ PFT's

## 2017-09-24 NOTE — Patient Instructions (Signed)
Medication Instructions:  INCREASE Hydralazine 50 mg take 1 tablet 3 times a day   Labwork: None   Testing/Procedures: None   Follow-Up: Your physician recommends that you schedule a follow-up appointment in: 3 months with Dr Stanford Breed.   Contact Dr Golden Pop office and schedule a follow up appointment  Any Other Special Instructions Will Be Listed Below (If Applicable). If you need a refill on your cardiac medications before your next appointment, please call your pharmacy.

## 2017-09-24 NOTE — Assessment & Plan Note (Signed)
Continue nocturnal oxygen 

## 2017-09-24 NOTE — Assessment & Plan Note (Signed)
Medication adjusted by Cards 09/24/2017

## 2017-09-24 NOTE — Assessment & Plan Note (Addendum)
Waking up with elevated HR, and short of breath Per her husband he has witnessed apnea, very shallow breathing  Pt. Takes many sedating medications including Buspar, Dilaudid 4 mg every 8 hours,, Gabapentin 300 BID, MS Contin 30 mg BID, Zanaflex 2 mg TID. Plan: Repeat split night sleep study Continue wearing oxygen at 3L at bedtime as you have been doing. Consider eliminating some of the sedating medications Follow up with Pearline Yerby/ Ramaswamy after sleep study/PFT's Consider repeat Echo

## 2017-09-24 NOTE — Progress Notes (Signed)
Cardiology Office Note    Date:  09/24/2017   ID:  Mary Acosta, DOB [birth year], MRN [id number]  PCP:  Binnie Rail, MD  Cardiologist:  Dr. Stanford Breed  Chief Complaint  Patient presents with  . Follow-up    seen for Dr. Stanford Breed. High blood pressure, SOB    History of Present Illness:  Mary Acosta is a 62 y.o. female with PMH of pulm HTN, HLD, IDDM, hypothyroidism, h/o TIA, polymyalgia rheumatica and COPD.  She had cardiac catheterization in 2006 which was normal.  Carotid Doppler in March 2012 showed no significant obstructive disease.  Myoview in May 2012 suggested mild inferior ischemia and EF 69%.  Cardiac CT from May 2012 showed calcium score of 0, no plaque or stenosis in the coronary arteries.  Echocardiogram in January 2011 showed normal LV function.  Of note, she had a sleep study in 2016 that shows she does not have obstructive sleep apnea, however she does have significant oxygen desaturation at night and was placed on nocturnal 3 L oxygen.  Patient was previously seen in January 2018 for atherosclerosis in the pulmonary hypertension seen on CT of the chest.  It also revealed bronchiolitis/bronchopneumonia along with possible cirrhosis. Aspirin 81 mg daily and Lipitor 40 mg daily were added.  Valsartan was increased to 160 mg daily.  Echocardiogram obtained on 09/24/2016 showed EF 50-55%, grade 1 DD, peak PA pressure 22 mmHg.  Echocardiogram seems to suggest normal pulmonary pressure.  Given lack of chest discomfort, no ischemic workup was warranted at the time.  Patient suffers episode of dehydration in June 2018 along with confusion and a toxic metabolic encephalopathy.  Creatinine trended up to 2.33.  Valsartan was discontinued at the time.  More recently, she has been having some blood pressure issues and was started on hydralazine 25 mg 3 times daily.  For the past 3 months, she has lost 11 pounds.  She often wake up in the middle the night with some degree of  shortness of breath and a pounding sensation in the chest.  On physical exam today, she only has trace amount of edema in the lower extremity, given prior history of dehydration, I am hesitant to add a diuretic at this time.  I think her symptom is more consistent with obesity hypoventilation syndrome.  She denies any significant snoring.  I recommended for her to follow-up with Dr. Chase Caller to see if her oxygen need to be adjusted, it will be problematic especially given her prior history of COPD.  I will defer to Dr. Chase Caller to decide whether or not to repeat a sleep study.  Otherwise we plan to increase hydralazine to 50 mg 3 times daily.  She has been instructed to monitor her blood pressure, and if systolic blood pressure drop below 100, she has been instructed to contact us so we can scale back on the hydralazine.  I think once her breathing issue and sleep issue improve, her blood pressure will return back to normal.  Otherwise she denies any exertional chest pain.  No EKG   Past Medical History:  Diagnosis Date  . AKI (acute kidney injury) ([place]) 01/2017  . Anxiety    with panic attacks  . Arthritis    "back; feet; hands; shoulders" (08/26/2014)  . Asthma   . Cervical cancer ([place])   . Chronic lower back pain   . Chronic narcotic use   . Chronic pain syndrome    [facility]  .  Cirrhosis ([place])   . Clostridium difficile infection 2017  . COPD (chronic obstructive pulmonary disease) ([place])   . Daily headache   . Depression   . Diabetic neuropathy ([place]) 06/04/2017  . DJD (degenerative joint disease)   . Fatty liver disease, nonalcoholic   . Fibromyalgia   . Frequency of urination   . HCAP (healthcare-associated pneumonia) 08/26/2014  . History of TIA (transient ischemic attack) 11-01-2010   NO RESIDUAL  . Hyperlipidemia   . Hypertension   . Hypothyroidism   . IDDM (insulin dependent diabetes mellitus) ([place])   . Insomnia   . Lumbar stenosis   . Memory difficulty  07/25/2016  . Nocturia   . OSA (obstructive sleep apnea)    NO CPAP SINCE WT LOSS  . Osteoarthritis    with severe disease in knee  . Pneumonia "several times"  . Polymyalgia rheumatica ([place])   . Scoliosis   . Seasonal allergies   . Thyroid cancer ([place])   . Urgency of urination   . Vaginal pain S/P SLING  FEB 2012    Past Surgical History:  Procedure Laterality Date  . APPENDECTOMY  1982  . BREAST LUMPECTOMY Left 02-28-2005   ATYPICAL DUCTAL HYPERPLASIA  . CARDIAC CATHETERIZATION  09-04-2004   NORMAL CORONARY ANATOMY/ NORMAL LVF/ EF 60%  . CARDIOVASCULAR STRESS TEST  12-27-2010  DR [place]   ABNORMAL NUCLEAR STUDY W/ /MILD INFERIOR ISCHEMIA/ EF 69%/  CT HEART ANGIOGRAM ;  NO ACUTE FINDINGS  . CRYOABLATION  05/16/2003   w/LEEP FOR ABNORMAL PAP SMEAR  . CYSTOSCOPY  05/18/2012   Procedure: CYSTOSCOPY;  Surgeon: Reece Packer, MD;  Location: [facility];  Service: Urology;  Laterality: N/A;  examination under anethesia  . ESOPHAGOGASTRODUODENOSCOPY (EGD) WITH PROPOFOL N/A 09/03/2016   Procedure: ESOPHAGOGASTRODUODENOSCOPY (EGD) WITH PROPOFOL;  Surgeon: Doran Stabler, MD;  Location: [facility];  Service: Gastroenterology;  Laterality: N/A;  . HYSTEROSCOPY W/D&C  08-19-2007   PMB  . KNEE ARTHROSCOPY W/ DEBRIDEMENT Left 03/29/2006   INTERNAL DERANGEMENT/ SEVERE DJD/ MENISCUS TEARS  . LAPAROSCOPIC CHOLECYSTECTOMY  06-10-2002  . LAPAROSCOPIC GASTRIC BANDING  03/01/2006   TRUNCAL VAGOTOMY/ PLACEMENT OF VG BAND  . REVISION TOTAL KNEE ARTHROPLASTY Left 08-29-2008; 05/2009  . TONSILLECTOMY  1969  . TOTAL KNEE ARTHROPLASTY Left 01-23-2007   SEVERE DJD  . TOTAL THYROIDECTOMY  11-22-2005   BILATERAL THYROID NODULES-- PAPILLARY CARCINOMA (0.5CM)/ ADENOMATOID NODULES  . TRANSTHORACIC ECHOCARDIOGRAM  12-27-2010   LVSF NORMAL / EF 51-76%/ GRADE I DIASTOLIC DYSFUNCTION/ MILD MITRAL REGURG. / MILDLY DILATED LEFT ATRIUM/ MILDY INCREASED SYSTOLIC PRESSURE OF PULMONARY ARTERIES    . TRANSVAGINAL SUBURETERAL TAPE/ SLING  09-28-2010   MIXED URINARY INCONTINENCE  . TUBAL LIGATION  1983    Current Medications: Outpatient Medications Prior to Visit  Medication Sig Dispense Refill  . albuterol (PROVENTIL HFA;VENTOLIN HFA) 108 (90 Base) MCG/ACT inhaler Inhale 2 puffs into the lungs every 6 (six) hours as needed for wheezing or shortness of breath. 1 Inhaler 2  . amitriptyline (ELAVIL) 50 MG tablet Take 2 tablets (100 mg total) by mouth at bedtime.    Marland Kitchen amLODipine (NORVASC) 5 MG tablet Take 1 tablet (5 mg total) by mouth 2 (two) times daily. 180 tablet 1  . amoxicillin-clavulanate (AUGMENTIN) 875-125 MG tablet Take 1 tablet by mouth 2 (two) times daily. 14 tablet 0  . atorvastatin (LIPITOR) 40 MG tablet TAKE 1 TABLET BY MOUTH EVERY DAY 90 tablet 3  . busPIRone (BUSPAR) 7.5 MG tablet Take  1 tablet (7.5 mg total) by mouth 3 (three) times daily. 270 tablet 0  . donepezil (ARICEPT) 10 MG tablet Take 1 tablet (10 mg total) at bedtime by mouth. 90 tablet 3  . HYDROmorphone (DILAUDID) 4 MG tablet Take 1 tablet (4 mg total) by mouth every 8 (eight) hours as needed for moderate pain.    Marland Kitchen insulin aspart (NOVOLOG) 100 UNIT/ML injection Inject 0-20 Units into the skin 3 (three) times daily with meals. CBG < 70: Eat or drink something sweet and recheck; CBG 70 - 120: 0 units;CBG 121 - 150: 3 units;CBG 151 - 200: 4 units;CBG 201 - 250: 7 units;CBG 251 - 300: 11 units;CBG 301 - 350: 15 units;CBG 351 - 400: 20 units;CBG > 400: call MD. 10 mL 0  . insulin NPH Human (NOVOLIN N) 100 UNIT/ML injection Inject 0.3 mLs (30 Units total) into the skin 2 (two) times daily before a meal.    . Insulin Syringe-Needle U-100 (INSULIN SYRINGE 1CC/30GX5/16") 30G X 5/16" 1 ML MISC Use as directed for insulin injections 5 times daily 150 each 1  . ipratropium-albuterol (DUONEB) 0.5-2.5 (3) MG/3ML SOLN Take 3 mLs by nebulization every 6 (six) hours as needed (Wheezing or dyspnea.).    Marland Kitchen levothyroxine (SYNTHROID,  LEVOTHROID) 175 MCG tablet TAKE 1 TABLET BY MOUTH SIX DAYS A WEEK BEFORE BREAKFAST 90 tablet 1  . Lidocaine HCl (ASPERCREME LIDOCAINE) 4 % LIQD Apply 1 application topically daily as needed (pain).    . meloxicam (MOBIC) 7.5 MG tablet Take 7.5 mg by mouth 2 (two) times daily.   5  . metoCLOPramide (REGLAN) 5 MG tablet Take 1 tablet (5 mg total) by mouth 3 (three) times daily before meals. 90 tablet 2  . morphine (MS CONTIN) 30 MG 12 hr tablet Take 30 mg by mouth 2 (two) times daily.    . potassium chloride SA (KLOR-CON M20) 20 MEQ tablet TAKE 2 TABLETS EVERY DAY AS NEEDED FOR CRAMPING 180 tablet 0  . tiZANidine (ZANAFLEX) 2 MG tablet Take 2 mg by mouth 3 (three) times daily.  2  . hydrALAZINE (APRESOLINE) 25 MG tablet Take 1 tablet (25 mg total) by mouth 3 (three) times daily. 270 tablet 1   No facility-administered medications prior to visit.      Allergies:   Gabapentin; Losartan; Oxycodone; Sulfa antibiotics; and Sulfonamide derivatives   Social History   Socioeconomic History  . Marital status: Married    Spouse name: None  . Number of children: 2  . Years of education: 59  . Highest education level: None  Social Needs  . Financial resource strain: None  . Food insecurity - worry: None  . Food insecurity - inability: None  . Transportation needs - medical: None  . Transportation needs - non-medical: None  Occupational History  . Occupation: disabled    Fish farm manager: UNEMPLOYED  Tobacco Use  . Smoking status: Former Smoker    Packs/day: 2.50    Years: 39.00    Pack years: 97.50    Types: Cigarettes    Last attempt to quit: 10/22/2010    Years since quitting: 6.9  . Smokeless tobacco: Never Used  Substance and Sexual Activity  . Alcohol use: No    Alcohol/week: 0.0 oz  . Drug use: No  . Sexual activity: Not Currently  Other Topics Concern  . None  Social History Narrative   Lives at home w/ her husband and grandson   Right-handed   Caffeine: 1 cup of coffee per week +  [employer]     Family History:  The patient's family history includes Breast cancer in her other; Colon cancer in her maternal uncle; Dementia in her mother; Diabetes in her mother; Heart disease in her father and mother; High blood pressure in her father.   ROS:   Please see the history of present illness.    ROS All other systems reviewed and are negative.   PHYSICAL EXAM:   VS:  BP (!) 168/102   Pulse 98   Ht 5\' 9"  (1.753 m)   Wt 286 lb (129.7 kg)   BMI 42.23 kg/m    GEN: Well nourished, well developed, in no acute distress  HEENT: normal  Neck: no JVD, carotid bruits, or masses Cardiac: RRR; no murmurs, rubs, or gallops. Trace amount of LE edema  Respiratory:  clear to auscultation bilaterally, normal work of breathing GI: soft, nontender, nondistended, + BS MS: no deformity or atrophy  Skin: warm and dry, no rash Neuro:  Alert and Oriented x 3, Strength and sensation are intact Psych: euthymic mood, full affect  Wt Readings from Last 3 Encounters:  09/24/17 286 lb (129.7 kg)  09/19/17 291 lb (132 kg)  06/13/17 297 lb (134.7 kg)      Studies/Labs Reviewed:   EKG:  EKG is not ordered today.    Recent Labs: 01/31/2017: B Natriuretic Peptide 16.8 02/05/2017: Magnesium 1.5 09/19/2017: ALT 13; BUN 10; Creatinine, Ser 0.87; Hemoglobin 12.7; Platelets 173.0; Potassium 4.1; Sodium 137; TSH 0.22   Lipid Panel No results found for: CHOL, TRIG, HDL, CHOLHDL, VLDL, LDLCALC, LDLDIRECT  Additional studies/ records that were reviewed today include:   Echo 09/24/2016 LV EF: 50% -   55%  Study Conclusions  - Left ventricle: The cavity size was normal. Wall thickness was   increased in a pattern of mild LVH. Systolic function was normal.   The estimated ejection fraction was in the range of 50% to 55%.   Wall motion was normal; there were no regional wall motion   abnormalities. Doppler parameters are consistent with abnormal   left ventricular relaxation (grade 1 diastolic  dysfunction). - Aortic valve: Transvalvular velocity was within the normal range.   There was no stenosis. There was no regurgitation. - Mitral valve: Transvalvular velocity was within the normal range.   There was no evidence for stenosis. There was no regurgitation. - Right ventricle: The cavity size was normal. Wall thickness was   normal. Systolic function was normal. - Tricuspid valve: There was no regurgitation. - Pulmonary arteries: Systolic pressure was within the normal   range. PA peak pressure: 22 mm Hg (S).   ASSESSMENT:    1. Essential hypertension   2. Hyperlipidemia, unspecified hyperlipidemia type   3. Hypothyroidism, unspecified type   4. IDDM (insulin dependent diabetes mellitus) ([place])   5. Chronic obstructive pulmonary disease, unspecified COPD type ([place])   6. Obesity hypoventilation syndrome ([place])   7. RBBB   8. First degree AV block      PLAN:  In order of problems listed above:  1. Uncontrolled high blood pressure: Likely exacerbated by poor sleep and obesity hypoventilation syndrome.  Will increase hydralazine to 50 mg 3 times daily.  Will need to avoid AV nodal blocking agent with her first-degree AV block and a right bundle branch block.  Off of valsartan since June 2018 due to dehydration and significant rising creatinine.  2. Obesity hypoventilation syndrome: She describes episodes where she would wake up in the middle the night feeling  short of breath with pounding sensation in her chest.  She does not not appears to be significantly volume overloaded on physical exam.  Given her prior history of dehydration and hospital admission in June 2018, I am hesitant to add a diuretic at this time.  I think her symptom is more related to obesity hypoventilation syndrome.  She had a previous sleep study in 2016 that was negative for obstructive sleep apnea however she was noted to have significant oxygen desaturation during sleep and was placed on 3 L nighttime oxygen.   She will need to be seen by Dr. Chase Caller for further evaluation.  She has lost more than 10 pounds in the past 3 months.  I encouraged her to continue to lose weight.  3. Hyperlipidemia: On 40 mg daily of Lipitor.  I have not seen any fasting lipid panel in recent years, recommend fasting lipid panel on the next office visit.  4. IDDM: Hemoglobin A1c greater than 11, uncontrolled.    5. Hypothyroidism: Managed by primary care provider.    Medication Adjustments/Labs and Tests Ordered: Current medicines are reviewed at length with the patient today.  Concerns regarding medicines are outlined above.  Medication changes, Labs and Tests ordered today are listed in the Patient Instructions below. Patient Instructions  Medication Instructions:  INCREASE Hydralazine 50 mg take 1 tablet 3 times a day   Labwork: None   Testing/Procedures: None   Follow-Up: Your physician recommends that you schedule a follow-up appointment in: 3 months with Dr Stanford Breed.   Contact Dr Golden Pop office and schedule a follow up appointment  Any Other Special Instructions Will Be Listed Below (If Applicable). If you need a refill on your cardiac medications before your next appointment, please call your pharmacy.     Hilbert Corrigan, [place]  09/24/2017 9:14 AM    [address], [place], [place]  [address] Phone: [phone]; Fax: [phone]

## 2017-09-24 NOTE — Progress Notes (Addendum)
History of Present Illness Mary Acosta is a 62 y.o. female with COPD,  OHS, PMH of pulm HTN, HLD, IDDM, hypothyroidism, h/o TIA, polymyalgia rheumatica . She is followed by Dr. Chase Acosta.   09/24/2017 Acute OV : Pt.presents for Acute OV for for " heart racing." She was seen by cards this am, cleared from a cards perspective with EKG  and  referred by cards to the pulmonary office. .She states she wakes up at night with her heart racing and she states  her blood pressure is high when this happens.  She states she is shaking when she awakens.. She states she is short of breath when she wakes up.She does not check her oxygen saturations.  She states she has daytime sleepiness. Per her husband, on some days she is not up more than 4 hours before she goes back to bed.. She states her quality of sleep is poor. She states she feels like  she is going to have a heart attack upon awakening..She states she has lost 10 pounds in the last 3 months. This is intentional weight loss.She states this has become progressively worse over the last month. He husband states he has witnessed her with periods of apnea.He also witnesses shallow respirations.She states she is compliant with her oxygen therapy every night. She states that she has had sleep tests in the past that indicate nocturnal hypoxemia, but no sleep apnea. She was in the hospital in June 2018. She states she is compliant with her Duonebs three times daily,( these are ordered as prn) and she uses her rescue inhaler as needed, usually about 2 times daily.She is very deconditioned 2/2 being inactive. She takes many sedating medications. She denies fever, chest pain, orthopnea or hemoptysis. She is not on maintenance medications for her COPD.She is compliant with her nocturnal oxygen, although she does not think it helps her.  Test Results: Echo 09/24/2016>> Left ventricle: The cavity size was normal. Wall thickness was   increased in a pattern of mild  LVH. Systolic function was normal.   The estimated ejection fraction was in the range of 50% to 55%.   Wall motion was normal; there were no regional wall motion   abnormalities. Doppler parameters are consistent with abnormal   left ventricular relaxation (grade 1 diastolic dysfunction). - Aortic valve: Transvalvular velocity was within the normal range.   There was no stenosis. There was no regurgitation. - Mitral valve: Transvalvular velocity was within the normal range.   There was no evidence for stenosis. There was no regurgitation. - Right ventricle: The cavity size was normal. Wall thickness was   normal. Systolic function was normal. - Tricuspid valve: There was no regurgitation. - Pulmonary arteries: Systolic pressure was within the normal   range. PA peak pressure: 22 mm Hg (S).  CBC Latest Ref Rng & Units 09/19/2017 06/13/2017 02/05/2017  WBC 4.0 - 10.5 K/uL 7.4 7.9 10.3  Hemoglobin 12.0 - 15.0 g/dL 12.7 12.8 12.6  Hematocrit 36.0 - 46.0 % 40.1 41.1 40.1  Platelets 150.0 - 400.0 K/uL 173.0 183.0 200.0    BMP Latest Ref Rng & Units 09/19/2017 06/13/2017 02/24/2017  Glucose 70 - 99 mg/dL 312(H) 441(H) 219(H)  BUN 6 - 23 mg/dL 10 13 21   Creatinine 0.40 - 1.20 mg/dL 0.87 1.04 0.86  Sodium 135 - 145 mEq/L 137 133(L) 138  Potassium 3.5 - 5.1 mEq/L 4.1 4.8 3.7  Chloride 96 - 112 mEq/L 95(L) 93(L) 100  CO2 19 - 32  mEq/L 34(H) 33(H) 33(H)  Calcium 8.4 - 10.5 mg/dL 9.0 9.3 8.7    BNP    Component Value Date/Time   BNP 16.8 01/31/2017 1028    ProBNP No results found for: PROBNP  PFT    Component Value Date/Time   FEV1PRE 2.65 11/01/2014 0959   FEV1POST 2.67 11/01/2014 0959   FVCPRE 3.26 11/01/2014 0959   FVCPOST 3.19 11/01/2014 0959   TLC 5.23 11/01/2014 0959   DLCOUNC 18.56 11/01/2014 0959   PREFEV1FVCRT 81 11/01/2014 0959   PSTFEV1FVCRT 84 11/01/2014 0959    No results found.   Past medical hx Past Medical History:  Diagnosis Date  . AKI (acute kidney  injury) ([place]) 01/2017  . Anxiety    with panic attacks  . Arthritis    "back; feet; hands; shoulders" (08/26/2014)  . Asthma   . Cervical cancer ([place])   . Chronic lower back pain   . Chronic narcotic use   . Chronic pain syndrome    [facility]  . Cirrhosis ([place])   . Clostridium difficile infection 2017  . COPD (chronic obstructive pulmonary disease) ([place])   . Daily headache   . Depression   . Diabetic neuropathy ([place]) 06/04/2017  . DJD (degenerative joint disease)   . Fatty liver disease, nonalcoholic   . Fibromyalgia   . Frequency of urination   . HCAP (healthcare-associated pneumonia) 08/26/2014  . History of TIA (transient ischemic attack) 11-01-2010   NO RESIDUAL  . Hyperlipidemia   . Hypertension   . Hypothyroidism   . IDDM (insulin dependent diabetes mellitus) ([place])   . Insomnia   . Lumbar stenosis   . Memory difficulty 07/25/2016  . Nocturia   . OSA (obstructive sleep apnea)    NO CPAP SINCE WT LOSS  . Osteoarthritis    with severe disease in knee  . Pneumonia "several times"  . Polymyalgia rheumatica ([place])   . Scoliosis   . Seasonal allergies   . Thyroid cancer ([place])   . Urgency of urination   . Vaginal pain S/P SLING  FEB 2012     Social History   Tobacco Use  . Smoking status: Former Smoker    Packs/day: 2.50    Years: 39.00    Pack years: 97.50    Types: Cigarettes    Last attempt to quit: 10/22/2010    Years since quitting: 6.9  . Smokeless tobacco: Never Used  Substance Use Topics  . Alcohol use: No    Alcohol/week: 0.0 oz  . Drug use: No    Ms.Mary Acosta reports that she quit smoking about 6 years ago. Her smoking use included cigarettes. She has a 97.50 pack-year smoking history. she has never used smokeless tobacco. She reports that she does not drink alcohol or use drugs.  Tobacco Cessation: Former smoker quit 2012 with a 97.5 pack year smoking history.  Past surgical hx, Family hx, Social hx all reviewed.  Current  Outpatient Medications on File Prior to Visit  Medication Sig  . albuterol (PROVENTIL HFA;VENTOLIN HFA) 108 (90 Base) MCG/ACT inhaler Inhale 2 puffs into the lungs every 6 (six) hours as needed for wheezing or shortness of breath.  Marland Kitchen amitriptyline (ELAVIL) 50 MG tablet Take 2 tablets (100 mg total) by mouth at bedtime.  Marland Kitchen amLODipine (NORVASC) 5 MG tablet Take 1 tablet (5 mg total) by mouth 2 (two) times daily.  Marland Kitchen amoxicillin-clavulanate (AUGMENTIN) 875-125 MG tablet Take 1 tablet by mouth 2 (two) times daily.  Marland Kitchen  atorvastatin (LIPITOR) 40 MG tablet TAKE 1 TABLET BY MOUTH EVERY DAY  . busPIRone (BUSPAR) 7.5 MG tablet Take 1 tablet (7.5 mg total) by mouth 3 (three) times daily.  Marland Kitchen donepezil (ARICEPT) 10 MG tablet Take 1 tablet (10 mg total) at bedtime by mouth.  . hydrALAZINE (APRESOLINE) 50 MG tablet Take 1 tablet (50 mg total) by mouth 3 (three) times daily.  Marland Kitchen HYDROmorphone (DILAUDID) 4 MG tablet Take 1 tablet (4 mg total) by mouth every 8 (eight) hours as needed for moderate pain.  Marland Kitchen insulin aspart (NOVOLOG) 100 UNIT/ML injection Inject 0-20 Units into the skin 3 (three) times daily with meals. CBG < 70: Eat or drink something sweet and recheck; CBG 70 - 120: 0 units;CBG 121 - 150: 3 units;CBG 151 - 200: 4 units;CBG 201 - 250: 7 units;CBG 251 - 300: 11 units;CBG 301 - 350: 15 units;CBG 351 - 400: 20 units;CBG > 400: call MD.  . insulin NPH Human (NOVOLIN N) 100 UNIT/ML injection Inject 0.3 mLs (30 Units total) into the skin 2 (two) times daily before a meal.  . Insulin Syringe-Needle U-100 (INSULIN SYRINGE 1CC/30GX5/16") 30G X 5/16" 1 ML MISC Use as directed for insulin injections 5 times daily  . ipratropium-albuterol (DUONEB) 0.5-2.5 (3) MG/3ML SOLN Take 3 mLs by nebulization every 6 (six) hours as needed (Wheezing or dyspnea.).  Marland Kitchen levothyroxine (SYNTHROID, LEVOTHROID) 175 MCG tablet TAKE 1 TABLET BY MOUTH SIX DAYS A WEEK BEFORE BREAKFAST  . Lidocaine HCl (ASPERCREME LIDOCAINE) 4 % LIQD Apply 1  application topically daily as needed (pain).  . meloxicam (MOBIC) 7.5 MG tablet Take 7.5 mg by mouth 2 (two) times daily.   . metoCLOPramide (REGLAN) 5 MG tablet Take 1 tablet (5 mg total) by mouth 3 (three) times daily before meals.  Marland Kitchen morphine (MS CONTIN) 30 MG 12 hr tablet Take 30 mg by mouth 2 (two) times daily.  . potassium chloride SA (KLOR-CON M20) 20 MEQ tablet TAKE 2 TABLETS EVERY DAY AS NEEDED FOR CRAMPING  . tiZANidine (ZANAFLEX) 2 MG tablet Take 2 mg by mouth 3 (three) times daily.   No current facility-administered medications on file prior to visit.      Allergies  Allergen Reactions  . Gabapentin Swelling    Swelling in legs Swelling in legs Swelling in legs  . Losartan Other (See Comments)    Myalgias and muscle cramping Other reaction(s): Other (See Comments) Myalgias and muscle cramping Myalgias and muscle cramping Other reaction(s): Other (See Comments) Myalgias and muscle cramping  . Oxycodone Itching  . Sulfa Antibiotics Nausea Only and Rash  . Sulfonamide Derivatives Nausea Only    Review Of Systems:  Constitutional:   No  weight loss, night sweats,  Fevers, chills, fatigue, or  lassitude.  HEENT:   No headaches,  Difficulty swallowing,  Tooth/dental problems, or  Sore throat,                No sneezing, itching, ear ache, nasal congestion, post nasal drip,   CV:  + chest pain,  No Orthopnea, PND, swelling in lower extremities, anasarca, dizziness,+ palpitations,no  syncope.   GI  No heartburn, indigestion, abdominal pain, nausea, vomiting, diarrhea, change in bowel habits, loss of appetite, bloody stools.   Resp: + shortness of breath with exertion or at rest.  No excess mucus, no productive cough,  No non-productive cough,  No coughing up of blood.  No change in color of mucus.  No wheezing.  No chest wall deformity  Skin: no rash or lesions.  GU: no dysuria, change in color of urine, no urgency or frequency.  No flank pain, no hematuria   MS:  No  joint pain or swelling.  No decreased range of motion.  No back pain.  Psych:  No change in mood or affect. No depression or anxiety.  No memory loss.   Vital Signs BP (!) 168/98 (BP Location: Left Arm, Cuff Size: Large)   Pulse 85   Ht 5\' 9"  (1.753 m)   Wt 286 lb 6.4 oz (129.9 kg)   SpO2 97%   BMI 42.29 kg/m    Physical Exam:  General- No distress,  A&Ox3, anxious ENT: No sinus tenderness, TM clear, pale nasal mucosa, no oral exudate,no post nasal drip, no LAN Cardiac: S1, S2, regular rate and rhythm, no murmur Chest: No wheeze/ rales/ dullness; no accessory muscle use, no nasal flaring, no sternal retractions Abd.: Soft Non-tender, obese Ext: No clubbing cyanosis, edema Neuro:  normal strength, MAE x 4, A & O x 3 Skin: No rashes, warm and dry Psych:  Anxious   Assessment/Plan  Obesity hypoventilation syndrome (HCC) Waking up with elevated HR, and short of breath Per her husband he has witnessed apnea, very shallow breathing  Pt. Takes many sedating medications including Buspar, Dilaudid 4 mg every 8 hours,, Gabapentin 300 BID, MS Contin 30 mg BID, Zanaflex 2 mg TID. Plan: Repeat split night sleep study Continue wearing oxygen at 3L at bedtime as you have been doing. Consider eliminating some of the sedating medications Follow up with Trajon Rosete/ Ramaswamy after sleep study/PFT's Consider repeat Echo  Chronic respiratory failure Continue nocturnal oxygen   COPD (chronic obstructive pulmonary disease) (HCC) Continue Duonebs up to 4 times daily for wheezing and dyspnea We will order PFT's to evaluate for worsening COPD need for maintenance medication Continue albuterol inhaler as needed  Follow up with Dr. Camargo Desanctis after Sleep Study/ PFT's  Essential hypertension Medication adjusted by Cards 09/24/2017    Magdalen Spatz, NP 09/24/2017  11:08 PM

## 2017-09-29 ENCOUNTER — Ambulatory Visit: Payer: Medicare HMO | Admitting: Internal Medicine

## 2017-09-29 ENCOUNTER — Telehealth (HOSPITAL_COMMUNITY): Payer: Self-pay

## 2017-09-29 DIAGNOSIS — G894 Chronic pain syndrome: Secondary | ICD-10-CM | POA: Diagnosis not present

## 2017-09-29 DIAGNOSIS — Z0289 Encounter for other administrative examinations: Secondary | ICD-10-CM | POA: Diagnosis not present

## 2017-09-29 DIAGNOSIS — Z5181 Encounter for therapeutic drug level monitoring: Secondary | ICD-10-CM | POA: Diagnosis not present

## 2017-09-29 DIAGNOSIS — Z882 Allergy status to sulfonamides status: Secondary | ICD-10-CM | POA: Diagnosis not present

## 2017-09-29 DIAGNOSIS — Z885 Allergy status to narcotic agent status: Secondary | ICD-10-CM | POA: Diagnosis not present

## 2017-09-29 DIAGNOSIS — Z888 Allergy status to other drugs, medicaments and biological substances status: Secondary | ICD-10-CM | POA: Diagnosis not present

## 2017-09-29 DIAGNOSIS — M533 Sacrococcygeal disorders, not elsewhere classified: Secondary | ICD-10-CM | POA: Diagnosis not present

## 2017-09-29 DIAGNOSIS — M5137 Other intervertebral disc degeneration, lumbosacral region: Secondary | ICD-10-CM | POA: Diagnosis not present

## 2017-10-03 ENCOUNTER — Encounter: Payer: Self-pay | Admitting: Gastroenterology

## 2017-10-03 ENCOUNTER — Other Ambulatory Visit (INDEPENDENT_AMBULATORY_CARE_PROVIDER_SITE_OTHER): Payer: Medicare HMO

## 2017-10-03 ENCOUNTER — Ambulatory Visit: Payer: Medicare HMO | Admitting: Gastroenterology

## 2017-10-03 VITALS — BP 122/78 | HR 80 | Ht 69.0 in | Wt 263.0 lb

## 2017-10-03 DIAGNOSIS — E1042 Type 1 diabetes mellitus with diabetic polyneuropathy: Secondary | ICD-10-CM | POA: Diagnosis not present

## 2017-10-03 DIAGNOSIS — R110 Nausea: Secondary | ICD-10-CM

## 2017-10-03 DIAGNOSIS — K7460 Unspecified cirrhosis of liver: Secondary | ICD-10-CM | POA: Diagnosis not present

## 2017-10-03 DIAGNOSIS — R140 Abdominal distension (gaseous): Secondary | ICD-10-CM

## 2017-10-03 LAB — PROTIME-INR
INR: 1.3 ratio — ABNORMAL HIGH (ref 0.8–1.0)
PROTHROMBIN TIME: 13.5 s — AB (ref 9.6–13.1)

## 2017-10-03 NOTE — Progress Notes (Signed)
[facility] Progress Note  Chief Complaint: Cryptogenic cirrhosis  Subjective  History:  That he was seen in follow-up for her cirrhosis.  Last visit April 2018.  She is believed to have developed cirrhosis from [place], she has poorly controlled diabetes and probable gastroparesis from that causing intermittent nausea and vomiting.  She can only tolerate infrequent Reglan dosing because she feels it makes her abdominal bloating worse.  No esophageal varices in January 2018. She was seen by both cardiology and pulmonary recently for nighttime episodes of "heart racing".  Pulmonary feels she has obesity hypoventilation and is known to have underlying COPD.  She is sent for sleep studies, and was advised to decrease the sedating medications that she takes.  Reports feeling better with lifestyle changes and more attention to her overall health.  She is more attentive to her diet and diabetes care, and has managed to lose about 10 pounds.  She is being worked up for sleep apnea.  She has a generally more positive outlook than I have seen her with before.  But he still has occasional nausea when her blood sugar is elevated.  She stopped taking the metoclopramide altogether because it increased her bloating.  ROS: Cardiovascular:  no chest pain Respiratory: Chronic dyspnea with exertion Poor sleep attributed to sleep apnea. Chronic pain syndrome, at least partially from diabetic neuropathy Anxiety under control with buspirone recently started by Dr. Quay Burow. Remainder of systems negative except as above The patient's Past Medical, Family and Social History were reviewed and are on file in the EMR.  Objective:  Med list reviewed  Current Outpatient Medications:  .  albuterol (PROVENTIL HFA;VENTOLIN HFA) 108 (90 Base) MCG/ACT inhaler, Inhale 2 puffs into the lungs every 6 (six) hours as needed for wheezing or shortness of breath., Disp: 1 Inhaler, Rfl: 2 .  amitriptyline (ELAVIL) 50 MG  tablet, Take 2 tablets (100 mg total) by mouth at bedtime., Disp: , Rfl:  .  amLODipine (NORVASC) 5 MG tablet, Take 1 tablet (5 mg total) by mouth 2 (two) times daily., Disp: 180 tablet, Rfl: 1 .  atorvastatin (LIPITOR) 40 MG tablet, TAKE 1 TABLET BY MOUTH EVERY DAY, Disp: 90 tablet, Rfl: 3 .  busPIRone (BUSPAR) 7.5 MG tablet, Take 1 tablet (7.5 mg total) by mouth 3 (three) times daily., Disp: 270 tablet, Rfl: 0 .  donepezil (ARICEPT) 10 MG tablet, Take 1 tablet (10 mg total) at bedtime by mouth., Disp: 90 tablet, Rfl: 3 .  hydrALAZINE (APRESOLINE) 50 MG tablet, Take 1 tablet (50 mg total) by mouth 3 (three) times daily., Disp: 90 tablet, Rfl: 5 .  HYDROmorphone (DILAUDID) 4 MG tablet, Take 1 tablet (4 mg total) by mouth every 8 (eight) hours as needed for moderate pain., Disp: , Rfl:  .  insulin aspart (NOVOLOG) 100 UNIT/ML injection, Inject 0-20 Units into the skin 3 (three) times daily with meals. CBG < 70: Eat or drink something sweet and recheck; CBG 70 - 120: 0 units;CBG 121 - 150: 3 units;CBG 151 - 200: 4 units;CBG 201 - 250: 7 units;CBG 251 - 300: 11 units;CBG 301 - 350: 15 units;CBG 351 - 400: 20 units;CBG > 400: call MD., Disp: 10 mL, Rfl: 0 .  insulin NPH Human (NOVOLIN N) 100 UNIT/ML injection, Inject 0.3 mLs (30 Units total) into the skin 2 (two) times daily before a meal., Disp: , Rfl:  .  Insulin Syringe-Needle U-100 (INSULIN SYRINGE 1CC/30GX5/16") 30G X 5/16" 1 ML MISC, Use as directed  for insulin injections 5 times daily, Disp: 150 each, Rfl: 1 .  ipratropium-albuterol (DUONEB) 0.5-2.5 (3) MG/3ML SOLN, Take 3 mLs by nebulization every 6 (six) hours as needed (Wheezing or dyspnea.)., Disp: , Rfl:  .  levothyroxine (SYNTHROID, LEVOTHROID) 175 MCG tablet, TAKE 1 TABLET BY MOUTH SIX DAYS A WEEK BEFORE BREAKFAST, Disp: 90 tablet, Rfl: 1 .  Lidocaine HCl (ASPERCREME LIDOCAINE) 4 % LIQD, Apply 1 application topically daily as needed (pain)., Disp: , Rfl:  .  meloxicam (MOBIC) 7.5 MG tablet,  Take 7.5 mg by mouth 2 (two) times daily. , Disp: , Rfl: 5 .  morphine (MS CONTIN) 30 MG 12 hr tablet, Take 30 mg by mouth 2 (two) times daily., Disp: , Rfl:  .  potassium chloride SA (KLOR-CON M20) 20 MEQ tablet, TAKE 2 TABLETS EVERY DAY AS NEEDED FOR CRAMPING, Disp: 180 tablet, Rfl: 0 .  tiZANidine (ZANAFLEX) 2 MG tablet, Take 2 mg by mouth 3 (three) times daily., Disp: , Rfl: 2   Vital signs in last 24 hrs: Vitals:   10/03/17 1036  BP: 122/78  Pulse: 80    Physical Exam  Obese and chronically ill-appearing woman.  However, her affect is certainly brighter than I have seen  before.  HEENT: sclera anicteric, oral mucosa moist without lesions  Neck: supple, no thyromegaly, JVD or lymphadenopathy  Cardiac: RRR without murmurs, S1S2 heard, no peripheral edema  Pulm: clear to auscultation bilaterally, normal RR and effort noted  Abdomen: soft, mild upper tenderness, with active bowel sounds. No guarding or palpable hepatosplenomegaly.  Skin; warm and dry, no jaundice or rash  Recent Labs:  Last AFP 2.03 August 2016 Last INR 1.2 Dec 17 CMP Latest Ref Rng & Units 09/19/2017 06/13/2017 02/24/2017  Glucose 70 - 99 mg/dL 312(H) 441(H) 219(H)  BUN 6 - 23 mg/dL 10 13 21   Creatinine 0.40 - 1.20 mg/dL 0.87 1.04 0.86  Sodium 135 - 145 mEq/L 137 133(L) 138  Potassium 3.5 - 5.1 mEq/L 4.1 4.8 3.7  Chloride 96 - 112 mEq/L 95(L) 93(L) 100  CO2 19 - 32 mEq/L 34(H) 33(H) 33(H)  Calcium 8.4 - 10.5 mg/dL 9.0 9.3 8.7  Total Protein 6.0 - 8.3 g/dL 7.0 7.3 7.1  Total Bilirubin 0.2 - 1.2 mg/dL 0.5 0.6 0.4  Alkaline Phos 39 - 117 U/L 104 110 110  AST 0 - 37 U/L 22 21 22   ALT 0 - 35 U/L 13 18 17     Radiologic studies: Last screening right upper quadrant ultrasound December 2017, no mass   @ASSESSMENTPLANBEGIN @ Assessment: Encounter Diagnoses  Name Primary?  . Cirrhosis of liver without ascites, unspecified hepatic cirrhosis type ([place]) Yes  . Nausea without vomiting   . Abdominal bloating    . Type 1 diabetes mellitus with diabetic polyneuropathy (HCC)     [place] cirrhosis, compensated with no history of GI bleeding, ascites or encephalopathy. Long-standing poorly controlled diabetes and obesity.  Intermittent nausea from diabetic gastropathy.  Plan:  INR, alpha-fetoprotein Right upper quadrant ultrasound to screen for hepatoma Follow-up with me in 6 months Total time 30 minutes, over half spent in counseling and coordination of care.   [name]

## 2017-10-03 NOTE — Patient Instructions (Signed)
If you are age 62 or older, your body mass index should be between 23-30. Your Body mass index is 38.84 kg/m. If this is out of the aforementioned range listed, please consider follow up with your Primary Care Provider.  If you are age 32 or younger, your body mass index should be between 19-25. Your Body mass index is 38.84 kg/m. If this is out of the aformentioned range listed, please consider follow up with your Primary Care Provider.   Your physician has requested that you go to the basement for  lab work before leaving today.  You have been scheduled for an abdominal ultrasound at [facility] (1st floor of hospital) on 10-08-2017 at 7am. Please arrive 15 minutes prior to your appointment for registration. Make certain not to have anything to eat or drink 6 hours prior to your appointment. Should you need to reschedule your appointment, please contact radiology at [phone]. This test typically takes about 30 minutes to perform.  Please follow up in 6 months. We will place a recall in our system!   Thank you for choosing [facility]  Dr Wilfrid Lund III

## 2017-10-06 LAB — AFP TUMOR MARKER: AFP-Tumor Marker: 2.9 ng/mL

## 2017-10-07 ENCOUNTER — Ambulatory Visit (HOSPITAL_BASED_OUTPATIENT_CLINIC_OR_DEPARTMENT_OTHER): Payer: Medicare HMO | Attending: Acute Care | Admitting: Pulmonary Disease

## 2017-10-07 VITALS — Ht 69.0 in | Wt 286.0 lb

## 2017-10-07 DIAGNOSIS — I493 Ventricular premature depolarization: Secondary | ICD-10-CM | POA: Insufficient documentation

## 2017-10-07 DIAGNOSIS — E662 Morbid (severe) obesity with alveolar hypoventilation: Secondary | ICD-10-CM

## 2017-10-07 DIAGNOSIS — Z79899 Other long term (current) drug therapy: Secondary | ICD-10-CM | POA: Diagnosis not present

## 2017-10-07 DIAGNOSIS — G4733 Obstructive sleep apnea (adult) (pediatric): Secondary | ICD-10-CM | POA: Diagnosis not present

## 2017-10-08 ENCOUNTER — Ambulatory Visit (HOSPITAL_COMMUNITY)
Admission: RE | Admit: 2017-10-08 | Discharge: 2017-10-08 | Disposition: A | Discharge status: Home / Self Care | Payer: Medicare HMO | Source: Ambulatory Visit | Attending: Gastroenterology | Admitting: Gastroenterology

## 2017-10-08 DIAGNOSIS — K7460 Unspecified cirrhosis of liver: Secondary | ICD-10-CM | POA: Diagnosis not present

## 2017-10-08 DIAGNOSIS — R110 Nausea: Secondary | ICD-10-CM

## 2017-10-08 DIAGNOSIS — K7689 Other specified diseases of liver: Secondary | ICD-10-CM | POA: Diagnosis not present

## 2017-10-08 DIAGNOSIS — E1042 Type 1 diabetes mellitus with diabetic polyneuropathy: Secondary | ICD-10-CM | POA: Diagnosis not present

## 2017-10-08 DIAGNOSIS — R140 Abdominal distension (gaseous): Secondary | ICD-10-CM

## 2017-10-12 DIAGNOSIS — J449 Chronic obstructive pulmonary disease, unspecified: Secondary | ICD-10-CM | POA: Diagnosis not present

## 2017-10-15 DIAGNOSIS — G4733 Obstructive sleep apnea (adult) (pediatric): Secondary | ICD-10-CM | POA: Diagnosis not present

## 2017-10-15 NOTE — Procedures (Signed)
Patient Name: Mary Acosta, Mary Acosta Study Date: 10/07/2017 Gender: Female D.O.B: 11/08/1955 Age (years): 61 Referring Provider: Sarah F Groce NP Height (inches): 69 Interpreting Physician: Rakesh Alva MD, ABSM Weight (lbs): 286 RPSGT: Mckinney, Takeya BMI: 42 MRN: [id number] Neck Size: 18.00 <br> <br> CLINICAL INFORMATION Sleep Study Type: Split Night   Indication for sleep study: Congestive Heart Failure, COPD, Hypertension, Obesity, OSA Priro study in 2006 showed severe OSA AHI 40/h corrected by CPAP 16cm. Another study in 2016 surprisigly did not show significant OSA    Epworth Sleepiness Score: 18  SLEEP STUDY TECHNIQUE As per the AASM Manual for the Scoring of Sleep and Associated Events v2.3 (April 2016) with a hypopnea requiring 4% desaturations.  The channels recorded and monitored were frontal, central and occipital EEG, electrooculogram (EOG), submentalis EMG (chin), nasal and oral airflow, thoracic and abdominal wall motion, anterior tibialis EMG, snore microphone, electrocardiogram, and pulse oximetry. Continuous positive airway pressure (CPAP) was initiated when the patient met split night criteria and was titrated according to treat sleep-disordered breathing.  MEDICATIONS Medications self-administered by patient taken the night of the study : TIZANIDINE, BUSPAR, ARICEPT, LIPITOR, AMLODIPINE, HYDRALAZINE, AMITRIPTYLINE  RESPIRATORY PARAMETERS Diagnostic  Total AHI (/hr): 27.2 RDI (/hr): 31.3 OA Index (/hr): 13.1 CA Index (/hr): 0.0 REM AHI (/hr): N/A NREM AHI (/hr): 27.2 Supine AHI (/hr): 27.2 Non-supine AHI (/hr): N/A Min O2 Sat (%): 85.00 Mean O2 (%): 90.51 Time below 88% (min): 17.7   Titration  Optimal Pressure (cm): 13 AHI at Optimal Pressure (/hr): 14.7 Min O2 at Optimal Pressure (%): 87.0 Supine % at Optimal (%): 100 Sleep % at Optimal (%): 98   SLEEP ARCHITECTURE The recording time for the entire night was 387.5 minutes.  During a baseline period of 184.2  minutes, the patient slept for 160.9 minutes in REM and nonREM, yielding a sleep efficiency of 87.3%. Sleep onset after lights out was 9.8 minutes with a REM latency of N/A minutes. The patient spent 5.28% of the night in stage N1 sleep, 94.72% in stage N2 sleep, 0.00% in stage N3 and 0.00% in REM.    During the titration period of 193.7 minutes, the patient slept for 189.0 minutes in REM and nonREM, yielding a sleep efficiency of 97.6%. Sleep onset after CPAP initiation was 2.0 minutes with a REM latency of 91.0 minutes. The patient spent 1.59% of the night in stage N1 sleep, 91.80% in stage N2 sleep, 0.00% in stage N3 and 6.61% in REM.  CARDIAC DATA The 2 lead EKG demonstrated sinus rhythm. The mean heart rate was 66.50 beats per minute. Other EKG findings include: PVCs.   LEG MOVEMENT DATA The total Periodic Limb Movements of Sleep (PLMS) were 0. The PLMS index was 0.00 .  IMPRESSIONS - Moderate obstructive sleep apnea occurred during the diagnostic portion of the study(AHI = 27.2/hour). An optimal PAP pressure was selected for this patient ( 13 cm of water) - Mild central sleep apnea occurred during the titration portion of the study (CAI = 0.0/hour). - Moderate oxygen desaturation was noted during the diagnostic portion of the study (Min O2 =85.00%). - The patient snored with moderate snoring volume during the diagnostic portion of the study. - EKG findings include PVCs. - Clinically significant periodic limb movements did not occur during sleep.   DIAGNOSIS - Obstructive Sleep Apnea (327.23 [G47.33 ICD-10])   RECOMMENDATIONS - Trial of autoCPAP therapy 8- 13 cm H2O with a Small size Resmed Full Face Mask AirFit F20 mask and heated humidification. - Avoid   alcohol, sedatives and other CNS depressants that may worsen sleep apnea and disrupt normal sleep architecture. - Sleep hygiene should be reviewed to assess factors that may improve sleep quality. - Weight management and regular  exercise should be initiated or continued. - Return to Sleep Center for re-evaluation after 4 weeks of therapy   Kara Mead MD Board Certified in [place]

## 2017-10-21 ENCOUNTER — Telehealth: Payer: Self-pay | Admitting: Acute Care

## 2017-10-21 NOTE — Telephone Encounter (Signed)
Called pt letting her know that we can see the HST has been completed but it has not been resulted yet by Eric Form.  Stated to pt once we have received the results of the HST, we would call her to let her know what the results are.  Pt expressed understanding. Going to route this to Eric Form for her to follow up on for pt.

## 2017-10-21 NOTE — Telephone Encounter (Signed)
Called and spoke with pt letting her know the results of the HST and recommendations per SG.  Pt stated due to her being extremely claustrophobic, she was unsure about beginning CPAP.  Scheduled pt for an OV with Eric Form 10/27/17 to discuss HST and all options with pt.  Nothing further needed at this current time.

## 2017-10-22 ENCOUNTER — Telehealth: Payer: Self-pay

## 2017-10-22 DIAGNOSIS — J449 Chronic obstructive pulmonary disease, unspecified: Secondary | ICD-10-CM | POA: Diagnosis not present

## 2017-10-22 NOTE — Telephone Encounter (Signed)
Incoming fax request from [facility] for Reglan. Rx denied as pt no longer takes per her last office visit dated 10-03-2017.

## 2017-10-27 ENCOUNTER — Encounter: Payer: Self-pay | Admitting: Acute Care

## 2017-10-27 ENCOUNTER — Ambulatory Visit: Payer: Medicare HMO | Admitting: Acute Care

## 2017-10-27 VITALS — BP 138/78 | HR 95 | Ht 69.0 in | Wt 277.6 lb

## 2017-10-27 DIAGNOSIS — G4733 Obstructive sleep apnea (adult) (pediatric): Secondary | ICD-10-CM | POA: Diagnosis not present

## 2017-10-27 DIAGNOSIS — J449 Chronic obstructive pulmonary disease, unspecified: Secondary | ICD-10-CM | POA: Diagnosis not present

## 2017-10-27 MED ORDER — IPRATROPIUM-ALBUTEROL 20-100 MCG/ACT IN AERS: 1.0000 | INHALATION_SPRAY | Freq: Four times a day (QID) | RESPIRATORY_TRACT | 3 refills | Status: DC

## 2017-10-27 NOTE — Progress Notes (Signed)
History of Present Illness Mary Acosta is a 62 y.o. female with COPD,  OHS, PMH of pulm HTN, HLD, IDDM,hypothyroidism, h/o TIA,polymyalgia rheumatica . She is followed by Dr. Chase Caller for COPD , and she sees Dr. Annamaria Boots for sleep..   10/27/2017 Pt. Returns for review of sleep study.Sleep study done 10/07/2017 indicates moderate sleep apnea. Recommendation per Dr. Elsworth Soho was for Auto CPAP 8-13 cm H2O. She states she has severe claustrophobia and cannot wear the face mask. She states she did not even pursue the machine as she knows she will not be able to wear the mask.She did not try a nasal pillow, just the facemask. She states her COPD is stable at present.She is compliant with her Duonebs and her albuterol inhaler.She is requesting a Combivent inhaler for her vacation as she does not want to take her neb machine on her trip.She denies fever, chest pain, orthopnea or hemoptysis.She states she continues to have apnea at night but she is wearing her nocturnal oxygen  Test Results: Sleep Study:10/07/2017 IMPRESSIONS - Moderate obstructive sleep apnea occurred during the diagnostic  portion of the study(AHI = 27.2/hour). An optimal PAP pressure  was selected for this patient ( 13 cm of water) - Mild central sleep apnea occurred during the titration portion  of the study (CAI = 0.0/hour). - Moderate oxygen desaturation was noted during the diagnostic  portion of the study (Min O2 =85.00%). - The patient snored with moderate snoring volume during the  diagnostic portion of the study. - EKG findings include PVCs. - Clinically significant periodic limb movements did not occur  during sleep.  RECOMMENDATIONS - Trial of autoCPAP therapy 8- 13 cm H2O with a Small size Resmed  Full Face Mask AirFit F20 mask and heated humidification. - Avoid alcohol, sedatives and other CNS depressants that may  worsen sleep apnea and disrupt normal sleep architecture. - Sleep hygiene should be reviewed to  assess factors that may  improve sleep quality. - Weight management and regular exercise should be initiated or  continued. - Return to Sleep Center for re-evaluation after 4 weeks of  therapy   Echo 09/24/2016>> Left ventricle: The cavity size was normal. Wall thickness was increased in a pattern of mild LVH. Systolic function was normal. The estimated ejection fraction was in the range of 50% to 55%. Wall motion was normal; there were no regional wall motion abnormalities. Doppler parameters are consistent with abnormal left ventricular relaxation (grade 1 diastolic dysfunction). - Aortic valve: Transvalvular velocity was within the normal range. There was no stenosis. There was no regurgitation. - Mitral valve: Transvalvular velocity was within the normal range. There was no evidence for stenosis. There was no regurgitation. - Right ventricle: The cavity size was normal. Wall thickness was normal. Systolic function was normal. - Tricuspid valve: There was no regurgitation. - Pulmonary arteries: Systolic pressure was within the normal range. PA peak pressure: 22 mm Hg (S).  CBC Latest Ref Rng & Units 09/19/2017 06/13/2017 02/05/2017  WBC 4.0 - 10.5 K/uL 7.4 7.9 10.3  Hemoglobin 12.0 - 15.0 g/dL 12.7 12.8 12.6  Hematocrit 36.0 - 46.0 % 40.1 41.1 40.1  Platelets 150.0 - 400.0 K/uL 173.0 183.0 200.0    BMP Latest Ref Rng & Units 09/19/2017 06/13/2017 02/24/2017  Glucose 70 - 99 mg/dL 312(H) 441(H) 219(H)  BUN 6 - 23 mg/dL 10 13 21   Creatinine 0.40 - 1.20 mg/dL 0.87 1.04 0.86  Sodium 135 - 145 mEq/L 137 133(L) 138  Potassium 3.5 -  5.1 mEq/L 4.1 4.8 3.7  Chloride 96 - 112 mEq/L 95(L) 93(L) 100  CO2 19 - 32 mEq/L 34(H) 33(H) 33(H)  Calcium 8.4 - 10.5 mg/dL 9.0 9.3 8.7    BNP    Component Value Date/Time   BNP 16.8 01/31/2017 1028    ProBNP No results found for: PROBNP  PFT    Component Value Date/Time   FEV1PRE 2.65 11/01/2014 0959   FEV1POST 2.67  11/01/2014 0959   FVCPRE 3.26 11/01/2014 0959   FVCPOST 3.19 11/01/2014 0959   TLC 5.23 11/01/2014 0959   DLCOUNC 18.56 11/01/2014 0959   PREFEV1FVCRT 81 11/01/2014 0959   PSTFEV1FVCRT 84 11/01/2014 0959    US Abdomen Limited Ruq  Result Date: 10/08/2017 CLINICAL DATA:  Hepatic cirrhosis EXAM: ULTRASOUND ABDOMEN LIMITED RIGHT UPPER QUADRANT COMPARISON:  August 23, 2016 FINDINGS: Gallbladder: Surgically absent. Common bile duct: Diameter: 5 mm. No intrahepatic or extrahepatic biliary duct dilatation. Liver: There is an 8 x 8 x 7 mm cyst in the left lobe of the liver. No other focal liver lesion is evident. The echogenicity of the liver overall is increased. Portal vein is patent on color Doppler imaging with normal direction of blood flow towards the liver. IMPRESSION: 1. Small cyst in left lobe of liver. No other focal liver lesion evident. 2. The echogenicity of the liver overall is increased. This finding most likely is indicative of hepatic steatosis, likely with superimposed parenchymal liver disease. While no noncystic liver lesions are evident on this study, it must be cautioned that the sensitivity of ultrasound for detection of noncystic liver lesions is diminished in this circumstance. 3.  Gallbladder absent. Electronically Signed   By: Lowella Grip III M.D.   On: 10/08/2017 08:09     Past medical hx Past Medical History:  Diagnosis Date  . AKI (acute kidney injury) ([place]) 01/2017  . Anxiety    with panic attacks  . Arthritis    "back; feet; hands; shoulders" (08/26/2014)  . Asthma   . Cervical cancer ([place])   . Chronic lower back pain   . Chronic narcotic use   . Chronic pain syndrome    [facility]  . Cirrhosis ([place])   . Clostridium difficile infection 2017  . COPD (chronic obstructive pulmonary disease) ([place])   . Daily headache   . Depression   . Diabetic neuropathy ([place]) 06/04/2017  . DJD (degenerative joint disease)   . Fatty liver disease,  nonalcoholic   . Fibromyalgia   . Frequency of urination   . HCAP (healthcare-associated pneumonia) 08/26/2014  . History of TIA (transient ischemic attack) 11-01-2010   NO RESIDUAL  . Hyperlipidemia   . Hypertension   . Hypothyroidism   . IDDM (insulin dependent diabetes mellitus) ([place])   . Insomnia   . Lumbar stenosis   . Memory difficulty 07/25/2016  . Nocturia   . OSA (obstructive sleep apnea)    NO CPAP SINCE WT LOSS  . Osteoarthritis    with severe disease in knee  . Pneumonia "several times"  . Polymyalgia rheumatica ([place])   . Scoliosis   . Seasonal allergies   . Thyroid cancer ([place])   . Urgency of urination   . Vaginal pain S/P SLING  FEB 2012     Social History   Tobacco Use  . Smoking status: Former Smoker    Packs/day: 2.50    Years: 39.00    Pack years: 97.50    Types: Cigarettes    Last attempt  to quit: 10/22/2010    Years since quitting: 7.0  . Smokeless tobacco: Never Used  Substance Use Topics  . Alcohol use: No    Alcohol/week: 0.0 oz  . Drug use: No    Ms.Floree Zuniga reports that she quit smoking about 7 years ago. Her smoking use included cigarettes. She has a 97.50 pack-year smoking history. she has never used smokeless tobacco. She reports that she does not drink alcohol or use drugs.  Tobacco Cessation: Former smoker quit 2012 with a 97.5 pack year smoking history.  Past surgical hx, Family hx, Social hx all reviewed.  Current Outpatient Medications on File Prior to Visit  Medication Sig  . albuterol (PROVENTIL HFA;VENTOLIN HFA) 108 (90 Base) MCG/ACT inhaler Inhale 2 puffs into the lungs every 6 (six) hours as needed for wheezing or shortness of breath.  Marland Kitchen amitriptyline (ELAVIL) 50 MG tablet Take 2 tablets (100 mg total) by mouth at bedtime.  Marland Kitchen amLODipine (NORVASC) 5 MG tablet Take 1 tablet (5 mg total) by mouth 2 (two) times daily.  Marland Kitchen atorvastatin (LIPITOR) 40 MG tablet TAKE 1 TABLET BY MOUTH EVERY DAY  . busPIRone (BUSPAR) 7.5 MG tablet  Take 1 tablet (7.5 mg total) by mouth 3 (three) times daily.  Marland Kitchen donepezil (ARICEPT) 10 MG tablet Take 1 tablet (10 mg total) at bedtime by mouth.  . hydrALAZINE (APRESOLINE) 50 MG tablet Take 1 tablet (50 mg total) by mouth 3 (three) times daily.  Marland Kitchen HYDROmorphone (DILAUDID) 4 MG tablet Take 1 tablet (4 mg total) by mouth every 8 (eight) hours as needed for moderate pain.  Marland Kitchen insulin aspart (NOVOLOG) 100 UNIT/ML injection Inject 0-20 Units into the skin 3 (three) times daily with meals. CBG < 70: Eat or drink something sweet and recheck; CBG 70 - 120: 0 units;CBG 121 - 150: 3 units;CBG 151 - 200: 4 units;CBG 201 - 250: 7 units;CBG 251 - 300: 11 units;CBG 301 - 350: 15 units;CBG 351 - 400: 20 units;CBG > 400: call MD.  . insulin NPH Human (NOVOLIN N) 100 UNIT/ML injection Inject 0.3 mLs (30 Units total) into the skin 2 (two) times daily before a meal.  . Insulin Syringe-Needle U-100 (INSULIN SYRINGE 1CC/30GX5/16") 30G X 5/16" 1 ML MISC Use as directed for insulin injections 5 times daily  . ipratropium-albuterol (DUONEB) 0.5-2.5 (3) MG/3ML SOLN Take 3 mLs by nebulization every 6 (six) hours as needed (Wheezing or dyspnea.).  Marland Kitchen levothyroxine (SYNTHROID, LEVOTHROID) 175 MCG tablet TAKE 1 TABLET BY MOUTH SIX DAYS A WEEK BEFORE BREAKFAST  . Lidocaine HCl (ASPERCREME LIDOCAINE) 4 % LIQD Apply 1 application topically daily as needed (pain).  . meloxicam (MOBIC) 7.5 MG tablet Take 7.5 mg by mouth 2 (two) times daily.   Marland Kitchen morphine (MS CONTIN) 30 MG 12 hr tablet Take 30 mg by mouth 2 (two) times daily.  . potassium chloride SA (KLOR-CON M20) 20 MEQ tablet TAKE 2 TABLETS EVERY DAY AS NEEDED FOR CRAMPING  . tiZANidine (ZANAFLEX) 2 MG tablet Take 2 mg by mouth 3 (three) times daily.   No current facility-administered medications on file prior to visit.      Allergies  Allergen Reactions  . Gabapentin Swelling    Swelling in legs Swelling in legs Swelling in legs  . Losartan Other (See Comments)    Myalgias  and muscle cramping Other reaction(s): Other (See Comments) Myalgias and muscle cramping Myalgias and muscle cramping Other reaction(s): Other (See Comments) Myalgias and muscle cramping  . Oxycodone Itching  .  Sulfa Antibiotics Nausea Only and Rash  . Sulfonamide Derivatives Nausea Only    Review Of Systems:  Constitutional:   No  weight loss, night sweats,  Fevers, chills, fatigue, or  lassitude.  HEENT:   No headaches,  Difficulty swallowing,  Tooth/dental problems, or  Sore throat,                No sneezing, itching, ear ache, nasal congestion, post nasal drip,   CV:  No chest pain,  Orthopnea, PND, swelling in lower extremities, anasarca, dizziness, palpitations, syncope.   GI  No heartburn, indigestion, abdominal pain, nausea, vomiting, diarrhea, change in bowel habits, loss of appetite, bloody stools.   Resp: + shortness of breath with exertion none  at rest.  No excess mucus, no productive cough,  No non-productive cough,  No coughing up of blood.  No change in color of mucus.  No wheezing.  No chest wall deformity  Skin: no rash or lesions.  GU: no dysuria, change in color of urine, no urgency or frequency.  No flank pain, no hematuria   MS:  No joint pain or swelling.  No decreased range of motion.  No back pain.  Psych:  No change in mood or affect. No depression or anxiety.  No memory loss.   Vital Signs BP 138/78 (BP Location: Left Arm, Cuff Size: Large)   Pulse 95   Ht 5\' 9"  (1.753 m)   Wt 277 lb 9.6 oz (125.9 kg)   SpO2 92%   BMI 40.99 kg/m    Physical Exam:  General- No distress,  A&Ox3, pleasant, slowed speech ENT: No sinus tenderness, TM clear, pale nasal mucosa, no oral exudate,no post nasal drip, no LAN Cardiac: S1, S2, regular rate and rhythm, no murmur Chest: No wheeze/ rales/ dullness; no accessory muscle use, no nasal flaring, no sternal retractions Abd.: Soft Non-tender, ND, Obese Ext: No clubbing cyanosis, edema Neuro:  normal strength  with some deconditioning at baseline Skin: No rashes, warm and dry Psych: normal mood and behavior   Assessment/Plan OSA/ Chronic respiratory failure (HCC) Has moderate sleep apnea, but states she cannot wear a face mask due to severe claustrophobia. Plan: We will have you go back to the sleep center for a mask trial of nasal pillows to see if you think you can tolerate this device. If you feel you can tolerate nasal pillows we will order the CPAP maching . We have you come back in 45 days after starting therapy to evaluate effectiveness. If you cannot tolerate the nasal pillows we will evaluate you for an oral appliance to help with your CPAP. Follow up with Dr. Annamaria Boots in 6 weeks ( Sleep) Follow up with Dr. Chase Caller in 3 months ( COPD) Rinse mouth after use. Please contact office for sooner follow up if symptoms do not improve or worsen or seek emergency care    COPD (chronic obstructive pulmonary disease) ([place]) Stable on Duonebs  Requesting Combivent Plan: Combivent 1 puff every 6 hours as needed for SOB Follow up with Dr. Geoffery Spruce in 3 months.   Nocturnal desaturations Continue wearing nocturnal oxygen as you have been doing.  Magdalen Spatz, NP 10/27/2017  10:24 AM

## 2017-10-27 NOTE — Assessment & Plan Note (Addendum)
Has moderate sleep apnea, but states she cannot wear a face mask due to severe claustrophobia. Plan: We will have you go back to the sleep center for a mask trial of nasal pillows to see if you think you can tolerate this device. If you feel you can tolerate nasal pillows we will order the CPAP maching . We have you come back in 45 days after starting therapy to evaluate effectiveness. If you cannot tolerate the nasal pillows we will evaluate you for an oral appliance to help with your CPAP. Follow up with Dr. Annamaria Boots in 6 weeks ( Sleep) Follow up with Dr. Chase Caller in 3 months ( COPD) Rinse mouth after use. Please contact office for sooner follow up if symptoms do not improve or worsen or seek emergency care

## 2017-10-27 NOTE — Assessment & Plan Note (Addendum)
Stable on Duonebs  Requesting Combivent Plan: Combivent 1 puff every 6 hours as needed for SOB Follow up with Dr. Geoffery Spruce in 3 months.

## 2017-10-27 NOTE — Patient Instructions (Addendum)
It is good to see you today. We will have you go back to the sleep center for a mask trial of nasal pillows to see if you think you can tolerate this device. If you feel you can tolerate nasal pillows we will order the CPAP maching . We have you come back in 45 days after starting therapy to evaluate effectiveness. If you cannot tolerate the nasal pillows we will evaluate you for an oral appliance to help with your CPAP. Follow up with Dr. Annamaria Boots in 6 weeks ( Sleep) Follow up with Dr. Chase Caller in 3 months ( COPD) Prescription for Combivent 2 puffs 4 times daily. Rinse mouth after use. Please contact office for sooner follow up if symptoms do not improve or worsen or seek emergency care

## 2017-10-30 ENCOUNTER — Other Ambulatory Visit: Payer: Self-pay | Admitting: Gastroenterology

## 2017-11-03 ENCOUNTER — Telehealth: Payer: Self-pay | Admitting: Internal Medicine

## 2017-11-03 ENCOUNTER — Other Ambulatory Visit: Payer: Self-pay | Admitting: Internal Medicine

## 2017-11-03 DIAGNOSIS — N644 Mastodynia: Secondary | ICD-10-CM

## 2017-11-03 DIAGNOSIS — Z1231 Encounter for screening mammogram for malignant neoplasm of breast: Secondary | ICD-10-CM

## 2017-11-03 NOTE — Telephone Encounter (Signed)
Which breast?

## 2017-11-03 NOTE — Telephone Encounter (Signed)
Copied from [place] [id number]. Topic: Quick Communication - See Telephone Encounter >> Nov 03, 2017  3:31 PM Vernona Rieger wrote: CRM for notification. See Telephone encounter for:   11/03/17.  Patient said that the breast center needs Dr Quay Burow to send over an order for a "diagnostic mammo" because she has a sore place on her breast They said just put into epic

## 2017-11-03 NOTE — Telephone Encounter (Signed)
Routing to dr burns, please advise, thanks 

## 2017-11-04 NOTE — Telephone Encounter (Signed)
Spoke with pt, states she has a sore place on her left breast

## 2017-11-06 ENCOUNTER — Encounter: Payer: Self-pay | Admitting: Internal Medicine

## 2017-11-06 DIAGNOSIS — E1165 Type 2 diabetes mellitus with hyperglycemia: Secondary | ICD-10-CM

## 2017-11-06 DIAGNOSIS — Z794 Long term (current) use of insulin: Principal | ICD-10-CM

## 2017-11-06 DIAGNOSIS — IMO0002 Reserved for concepts with insufficient information to code with codable children: Secondary | ICD-10-CM

## 2017-11-09 DIAGNOSIS — J449 Chronic obstructive pulmonary disease, unspecified: Secondary | ICD-10-CM | POA: Diagnosis not present

## 2017-11-10 ENCOUNTER — Ambulatory Visit
Admission: RE | Admit: 2017-11-10 | Discharge: 2017-11-10 | Disposition: A | Discharge status: Home / Self Care | Payer: Medicare HMO | Source: Ambulatory Visit | Attending: Internal Medicine | Admitting: Internal Medicine

## 2017-11-10 ENCOUNTER — Ambulatory Visit: Payer: Medicare HMO

## 2017-11-10 DIAGNOSIS — N644 Mastodynia: Secondary | ICD-10-CM

## 2017-11-10 DIAGNOSIS — R928 Other abnormal and inconclusive findings on diagnostic imaging of breast: Secondary | ICD-10-CM | POA: Diagnosis not present

## 2017-11-12 ENCOUNTER — Ambulatory Visit (HOSPITAL_BASED_OUTPATIENT_CLINIC_OR_DEPARTMENT_OTHER): Payer: Medicare HMO | Attending: Acute Care | Admitting: Radiology

## 2017-11-12 ENCOUNTER — Other Ambulatory Visit: Payer: Self-pay | Admitting: Emergency Medicine

## 2017-11-12 ENCOUNTER — Encounter: Payer: Self-pay | Admitting: Internal Medicine

## 2017-11-12 DIAGNOSIS — G4733 Obstructive sleep apnea (adult) (pediatric): Secondary | ICD-10-CM

## 2017-11-12 MED ORDER — POTASSIUM CHLORIDE CRYS ER 20 MEQ PO TBCR: EXTENDED_RELEASE_TABLET | ORAL | 1 refills | Status: DC

## 2017-11-12 NOTE — Addendum Note (Signed)
Addended by: Terence Lux B on: 11/12/2017 01:54 PM   Modules accepted: Orders

## 2017-11-16 ENCOUNTER — Other Ambulatory Visit: Payer: Self-pay | Admitting: Internal Medicine

## 2017-11-16 ENCOUNTER — Other Ambulatory Visit: Payer: Self-pay | Admitting: Gastroenterology

## 2017-11-17 DIAGNOSIS — J449 Chronic obstructive pulmonary disease, unspecified: Secondary | ICD-10-CM | POA: Diagnosis not present

## 2017-11-24 ENCOUNTER — Other Ambulatory Visit: Payer: Self-pay | Admitting: Internal Medicine

## 2017-12-01 ENCOUNTER — Telehealth: Payer: Self-pay | Admitting: Internal Medicine

## 2017-12-01 DIAGNOSIS — G894 Chronic pain syndrome: Secondary | ICD-10-CM | POA: Diagnosis not present

## 2017-12-01 DIAGNOSIS — M533 Sacrococcygeal disorders, not elsewhere classified: Secondary | ICD-10-CM | POA: Diagnosis not present

## 2017-12-01 MED ORDER — AMOXICILLIN-POT CLAVULANATE 875-125 MG PO TABS: 1.0000 | ORAL_TABLET | Freq: Two times a day (BID) | ORAL | 0 refills | Status: DC

## 2017-12-01 NOTE — Telephone Encounter (Signed)
Ok - augmentin sent to [facility]

## 2017-12-01 NOTE — Telephone Encounter (Signed)
Copied from [place] [id number]. Topic: Inquiry >> Dec 01, 2017  1:25 PM Conception Chancy, NT wrote: Reason for CRM: patient states she has a bladder infection and she states she had a appt in [place] this morning to get injections in her back and she is just returning home. She states she is burning when she urinates. She states she was told not to move much or do anything today due to just having injections, therefore patient is requesting a prescription called in for a bladder infection without appt. Please advise.  [id number] [place], [place] - [place] [address] [place] [address] Phone: [phone] Fax: [phone]

## 2017-12-01 NOTE — Telephone Encounter (Signed)
Spoke with pt. She had already picked up the RX

## 2017-12-03 ENCOUNTER — Ambulatory Visit: Payer: Medicare HMO | Admitting: Adult Health

## 2017-12-03 ENCOUNTER — Encounter: Payer: Self-pay | Admitting: Adult Health

## 2017-12-03 VITALS — BP 224/121 | HR 100 | Ht 69.0 in | Wt 279.0 lb

## 2017-12-03 DIAGNOSIS — R030 Elevated blood-pressure reading, without diagnosis of hypertension: Secondary | ICD-10-CM

## 2017-12-03 DIAGNOSIS — E1142 Type 2 diabetes mellitus with diabetic polyneuropathy: Secondary | ICD-10-CM | POA: Diagnosis not present

## 2017-12-03 DIAGNOSIS — R413 Other amnesia: Secondary | ICD-10-CM | POA: Diagnosis not present

## 2017-12-03 NOTE — Progress Notes (Signed)
PATIENT: Mary Acosta DOB: 1956/01/28  REASON FOR VISIT: follow up HISTORY FROM: patient  HISTORY OF PRESENT ILLNESS: Today 12/03/17 [name] is a 62 year old female with a history of peripheral neuropathy and fibromyalgia.  She returns today for follow-up.  She continues on amitriptyline 100 mg at bedtime.  In the past she has tried to wean off of this medication but developed significant pain in the legs.  She states that the combination of amitriptyline, morphine and Dilaudid controls her peripheral neuropathy.  She states that since starting Aricept for her memory she has noticed the benefit.  She is able to complete most ADLs independently.  She does report that she has some orthopedic issues that makes mobility an issue at times.  She rarely operates a Teacher, music.  Denies any changes in her mood or behavior.  She reports at times she has trouble sleeping but most likely this is due to discomfort.  Her blood pressure is significantly elevated today.  Reports that she did not take her medication this morning.  She returns today for an evaluation    HISTORY [name] is a 62 year old right-handed white female with a history of obesity, diabetes, and diabetic peripheral neuropathy. The patient has been on amitriptyline taking 100 mg at night for her neuropathy pain. On her last visit, we tried to reduce the dose down to 50 mg at night but the patient had significant increases in discomfort in the legs and she went back up to 100 mg at night. She was placed on Keppra for the discomfort, but she indicated this offered no benefit, and she stopped the drug. She continues to have some memory issues, she does operate a motor vehicle, she has not given up any activities of daily living because of memory. She has been evaluated for sleep apnea, she has had some problems with desaturation of her oxygen at night and she was placed on oxygen in the evening hours. The patient is on  opiate medications. She comes to this office for an evaluation.    REVIEW OF SYSTEMS: Out of a complete 14 system review of symptoms, the patient complains only of the following symptoms, and all other reviewed systems are negative.  Cold intolerance, heat intolerance, flushing, restless leg, insomnia, apnea, joint pain, joint swelling, back pain, aching muscles, walking difficulty, neck stiffness, numbness, weakness  ALLERGIES: Allergies  Allergen Reactions  . Gabapentin Swelling    Swelling in legs Swelling in legs Swelling in legs  . Losartan Other (See Comments)    Myalgias and muscle cramping Other reaction(s): Other (See Comments) Myalgias and muscle cramping Myalgias and muscle cramping Other reaction(s): Other (See Comments) Myalgias and muscle cramping  . Oxycodone Itching  . Sulfa Antibiotics Nausea Only and Rash  . Sulfonamide Derivatives Nausea Only    HOME MEDICATIONS: Outpatient Medications Prior to Visit  Medication Sig Dispense Refill  . albuterol (PROVENTIL HFA;VENTOLIN HFA) 108 (90 Base) MCG/ACT inhaler Inhale 2 puffs into the lungs every 6 (six) hours as needed for wheezing or shortness of breath. 1 Inhaler 2  . amitriptyline (ELAVIL) 50 MG tablet Take 2 tablets (100 mg total) by mouth at bedtime.    Marland Kitchen amoxicillin-clavulanate (AUGMENTIN) 875-125 MG tablet Take 1 tablet by mouth 2 (two) times daily. 14 tablet 0  . atorvastatin (LIPITOR) 40 MG tablet TAKE 1 TABLET BY MOUTH EVERY DAY 90 tablet 3  . busPIRone (BUSPAR) 7.5 MG tablet Take 1 tablet (7.5 mg total) by  mouth 3 (three) times daily. 270 tablet 0  . donepezil (ARICEPT) 10 MG tablet Take 1 tablet (10 mg total) at bedtime by mouth. 90 tablet 3  . hydrALAZINE (APRESOLINE) 50 MG tablet Take 1 tablet (50 mg total) by mouth 3 (three) times daily. 90 tablet 5  . HYDROmorphone (DILAUDID) 4 MG tablet Take 1 tablet (4 mg total) by mouth every 8 (eight) hours as needed for moderate pain.    Marland Kitchen insulin aspart  (NOVOLOG) 100 UNIT/ML injection Inject 0-20 Units into the skin 3 (three) times daily with meals. CBG < 70: Eat or drink something sweet and recheck; CBG 70 - 120: 0 units;CBG 121 - 150: 3 units;CBG 151 - 200: 4 units;CBG 201 - 250: 7 units;CBG 251 - 300: 11 units;CBG 301 - 350: 15 units;CBG 351 - 400: 20 units;CBG > 400: call MD. 10 mL 0  . insulin NPH Human (NOVOLIN N) 100 UNIT/ML injection Inject 0.3 mLs (30 Units total) into the skin 2 (two) times daily before a meal.    . Insulin Syringe-Needle U-100 (INSULIN SYRINGE 1CC/30GX5/16") 30G X 5/16" 1 ML MISC Use as directed for insulin injections 5 times daily 150 each 1  . Ipratropium-Albuterol (COMBIVENT RESPIMAT) 20-100 MCG/ACT AERS respimat Inhale 1 puff into the lungs every 6 (six) hours. 1 Inhaler 3  . ipratropium-albuterol (DUONEB) 0.5-2.5 (3) MG/3ML SOLN Take 3 mLs by nebulization every 6 (six) hours as needed (Wheezing or dyspnea.).    Marland Kitchen levothyroxine (SYNTHROID, LEVOTHROID) 175 MCG tablet Take 1 tablet by mouth six days a week before breakfast 90 tablet 0  . Lidocaine HCl (ASPERCREME LIDOCAINE) 4 % LIQD Apply 1 application topically daily as needed (pain).    . meloxicam (MOBIC) 7.5 MG tablet Take 7.5 mg by mouth 2 (two) times daily.   5  . morphine (MS CONTIN) 30 MG 12 hr tablet Take 30 mg by mouth 2 (two) times daily.    . potassium chloride SA (KLOR-CON M20) 20 MEQ tablet TAKE 2 TABLETS EVERY DAY AS NEEDED FOR CRAMPING 180 tablet 1  . tiZANidine (ZANAFLEX) 2 MG tablet Take 2 mg by mouth 3 (three) times daily.  2  . levothyroxine (SYNTHROID, LEVOTHROID) 175 MCG tablet TAKE 1 TABLET BY MOUTH SIX DAYS A WEEK BEFORE BREAKFAST 90 tablet 1  . amLODipine (NORVASC) 5 MG tablet Take 1 tablet (5 mg total) by mouth 2 (two) times daily. 180 tablet 1   No facility-administered medications prior to visit.     PAST MEDICAL HISTORY: Past Medical History:  Diagnosis Date  . AKI (acute kidney injury) ([place]) 01/2017  . Anxiety    with panic attacks   . Arthritis    "back; feet; hands; shoulders" (08/26/2014)  . Asthma   . Cervical cancer ([place])   . Chronic lower back pain   . Chronic narcotic use   . Chronic pain syndrome    [facility]  . Cirrhosis ([place])   . Clostridium difficile infection 2017  . COPD (chronic obstructive pulmonary disease) ([place])   . Daily headache   . Depression   . Diabetic neuropathy ([place]) 06/04/2017  . DJD (degenerative joint disease)   . Fatty liver disease, nonalcoholic   . Fibromyalgia   . Frequency of urination   . HCAP (healthcare-associated pneumonia) 08/26/2014  . History of TIA (transient ischemic attack) 11-01-2010   NO RESIDUAL  . Hyperlipidemia   . Hypertension   . Hypothyroidism   . IDDM (insulin dependent diabetes mellitus) ([place])   .  Insomnia   . Lumbar stenosis   . Memory difficulty 07/25/2016  . Nocturia   . OSA (obstructive sleep apnea)    NO CPAP SINCE WT LOSS  . Osteoarthritis    with severe disease in knee  . Pneumonia "several times"  . Polymyalgia rheumatica ([place])   . Scoliosis   . Seasonal allergies   . Thyroid cancer ([place])   . Urgency of urination   . Vaginal pain S/P SLING  FEB 2012    PAST SURGICAL HISTORY: Past Surgical History:  Procedure Laterality Date  . APPENDECTOMY  1982  . BREAST EXCISIONAL BIOPSY Left 02/28/2005   Atypical Ductal Hyperplasia  . CARDIAC CATHETERIZATION  09-04-2004   NORMAL CORONARY ANATOMY/ NORMAL LVF/ EF 60%  . CARDIOVASCULAR STRESS TEST  12-27-2010  DR [place]   ABNORMAL NUCLEAR STUDY W/ /MILD INFERIOR ISCHEMIA/ EF 69%/  CT HEART ANGIOGRAM ;  NO ACUTE FINDINGS  . CRYOABLATION  05/16/2003   w/LEEP FOR ABNORMAL PAP SMEAR  . CYSTOSCOPY  05/18/2012   Procedure: CYSTOSCOPY;  Surgeon: Reece Packer, MD;  Location[facility];  Service: Urology;  Laterality: N/A;  examination under anethesia  . ESOPHAGOGASTRODUODENOSCOPY (EGD) WITH PROPOFOL N/A 09/03/2016   Procedure: ESOPHAGOGASTRODUODENOSCOPY (EGD) WITH  PROPOFOL;  Surgeon: Doran Stabler, MD;  Location: [facility];  Service: Gastroenterology;  Laterality: N/A;  . HYSTEROSCOPY W/D&C  08-19-2007   PMB  . KNEE ARTHROSCOPY W/ DEBRIDEMENT Left 03/29/2006   INTERNAL DERANGEMENT/ SEVERE DJD/ MENISCUS TEARS  . LAPAROSCOPIC CHOLECYSTECTOMY  06-10-2002  . LAPAROSCOPIC GASTRIC BANDING  03/01/2006   TRUNCAL VAGOTOMY/ PLACEMENT OF VG BAND  . REVISION TOTAL KNEE ARTHROPLASTY Left 08-29-2008; 05/2009  . TONSILLECTOMY  1969  . TOTAL KNEE ARTHROPLASTY Left 01-23-2007   SEVERE DJD  . TOTAL THYROIDECTOMY  11-22-2005   BILATERAL THYROID NODULES-- PAPILLARY CARCINOMA (0.5CM)/ ADENOMATOID NODULES  . TRANSTHORACIC ECHOCARDIOGRAM  12-27-2010   LVSF NORMAL / EF 07-37%/ GRADE I DIASTOLIC DYSFUNCTION/ MILD MITRAL REGURG. / MILDLY DILATED LEFT ATRIUM/ MILDY INCREASED SYSTOLIC PRESSURE OF PULMONARY ARTERIES  . TRANSVAGINAL SUBURETERAL TAPE/ SLING  09-28-2010   MIXED URINARY INCONTINENCE  . TUBAL LIGATION  1983    FAMILY HISTORY: Family History  Problem Relation Age of Onset  . Diabetes Mother   . Heart disease Mother   . Dementia Mother   . Heart disease Father   . High blood pressure Father   . Colon cancer Maternal Uncle        x 2  . Breast cancer Other        great aunts x 5    SOCIAL HISTORY: Social History   Socioeconomic History  . Marital status: Married    Spouse name: Not on file  . Number of children: 2  . Years of education: 7  . Highest education level: Not on file  Occupational History  . Occupation: disabled    Fish farm manager: UNEMPLOYED  Social Needs  . Financial resource strain: Not on file  . Food insecurity:    Worry: Not on file    Inability: Not on file  . Transportation needs:    Medical: Not on file    Non-medical: Not on file  Tobacco Use  . Smoking status: Former Smoker    Packs/day: 2.50    Years: 39.00    Pack years: 97.50    Types: Cigarettes    Last attempt to quit: 10/22/2010    Years since quitting: 7.1   . Smokeless tobacco: Never Used  Substance and Sexual Activity  . Alcohol use: No    Alcohol/week: 0.0 oz  . Drug use: No  . Sexual activity: Not Currently  Lifestyle  . Physical activity:    Days per week: Not on file    Minutes per session: Not on file  . Stress: Not on file  Relationships  . Social connections:    Talks on phone: Not on file    Gets together: Not on file    Attends religious service: Not on file    Active member of club or organization: Not on file    Attends meetings of clubs or organizations: Not on file    Relationship status: Not on file  . Intimate partner violence:    Fear of current or ex partner: Not on file    Emotionally abused: Not on file    Physically abused: Not on file    Forced sexual activity: Not on file  Other Topics Concern  . Not on file  Social History Narrative   Lives at home w/ her husband and grandson   Right-handed   Caffeine: 1 cup of coffee per week + [employer]      PHYSICAL EXAM  Vitals:   12/03/17 1117  BP: (!) 224/121  Pulse: 100  Weight: 279 lb (126.6 kg)  Height: 5\' 9"  (1.753 m)   Body mass index is 41.2 kg/m.   MMSE - Mini Mental State Exam 12/03/2017 06/04/2017 12/27/2016  Orientation to time 5 5 4   Orientation to Place 4 5 5   Registration 3 3 3   Attention/ Calculation 3 1 5   Recall 2 1 1   Language- name 2 objects 2 2 2   Language- repeat 1 1 1   Language- follow 3 step command 3 2 3   Language- read & follow direction 1 1 1   Write a sentence 1 1 1   Copy design 1 0 0  Total score 26 22 26      Generalized: Well developed, in no acute distress   Neurological examination  Mentation: Alert oriented to time, place, history taking. Follows all commands speech and language fluent Cranial nerve II-XII: Pupils were equal round reactive to light. Extraocular movements were full, visual field were full on confrontational test. Facial sensation and strength were normal. Uvula tongue midline. Head turning and  shoulder shrug  were normal and symmetric. Motor: The motor testing reveals 5 over 5 strength of all 4 extremities. Good symmetric motor tone is noted throughout.  Sensory: Sensory testing is intact to soft touch on all 4 extremities. No evidence of extinction is noted.  Coordination: Cerebellar testing reveals good finger-nose-finger and heel-to-shin bilaterally.  Gait and station: Gait is normal. Reflexes: Deep tendon reflexes are symmetric and normal bilaterally.   DIAGNOSTIC DATA (LABS, IMAGING, TESTING) - I reviewed patient records, labs, notes, testing and imaging myself where available.  Lab Results  Component Value Date   WBC 7.4 09/19/2017   HGB 12.7 09/19/2017   HCT 40.1 09/19/2017   MCV 75.9 (L) 09/19/2017   PLT 173.0 09/19/2017      Component Value Date/Time   NA 137 09/19/2017 1418   K 4.1 09/19/2017 1418   CL 95 (L) 09/19/2017 1418   CO2 34 (H) 09/19/2017 1418   GLUCOSE 312 (H) 09/19/2017 1418   BUN 10 09/19/2017 1418   CREATININE 0.87 09/19/2017 1418   CALCIUM 9.0 09/19/2017 1418   PROT 7.0 09/19/2017 1418   ALBUMIN 3.7 09/19/2017 1418   AST 22 09/19/2017 1418  ALT 13 09/19/2017 1418   ALKPHOS 104 09/19/2017 1418   BILITOT 0.5 09/19/2017 1418   GFRNONAA 49 (L) 02/02/2017 0526   GFRAA 56 (L) 02/02/2017 0526   No results found for: CHOL, HDL, LDLCALC, LDLDIRECT, TRIG, CHOLHDL Lab Results  Component Value Date   HGBA1C 11.9 (H) 09/19/2017   Lab Results  Component Value Date   VITAMINB12 287 02/24/2017   Lab Results  Component Value Date   TSH 0.22 (L) 09/19/2017      ASSESSMENT AND PLAN 62 y.o. year old female  has a past medical history of AKI (acute kidney injury) ([place]) (01/2017), Anxiety, Arthritis, Asthma, Cervical cancer ([place]), Chronic lower back pain, Chronic narcotic use, Chronic pain syndrome, Cirrhosis ([place]), Clostridium difficile infection (2017), COPD (chronic obstructive pulmonary disease) ([place]), Daily headache, Depression, Diabetic  neuropathy ([place]) (06/04/2017), DJD (degenerative joint disease), Fatty liver disease, nonalcoholic, Fibromyalgia, Frequency of urination, HCAP (healthcare-associated pneumonia) (08/26/2014), History of TIA (transient ischemic attack) (11-01-2010), Hyperlipidemia, Hypertension, Hypothyroidism, IDDM (insulin dependent diabetes mellitus) ([place]), Insomnia, Lumbar stenosis, Memory difficulty (07/25/2016), Nocturia, OSA (obstructive sleep apnea), Osteoarthritis, Pneumonia ("several times"), Polymyalgia rheumatica ([place]), Scoliosis, Seasonal allergies, Thyroid cancer ([place]), Urgency of urination, and Vaginal pain (S/P SLING  FEB 2012). here with:  1.  Memory disturbance 2.  Peripheral neuropathy 3.  Elevated blood pressure  The patient's memory score has improved since the last visit.  She will continue on Aricept.  The patient's neuropathy is controlled with amitriptyline in addition to opioid medication.  She will continue on amitriptyline 100 mg at bedtime.  The patient's blood pressure is elevated today but did improve on recheck. she is advised that she should take her blood pressure medicine as soon as she gets home.  She should recheck her blood pressure 1-2 hours.  If it is not improving then she should discuss with her PCP or go to the emergency room.  Patient voiced understanding.  She will follow-up in 6 months or sooner if needed.      Ward Givens, MSN, NP-C 12/03/2017, 11:31 AM [facility] [address] [place], [place] [address] [phone]

## 2017-12-03 NOTE — Progress Notes (Signed)
I have read the note, and I agree with the clinical assessment and plan.  Kamilia Carollo K Elyanna Wallick   

## 2017-12-03 NOTE — Patient Instructions (Signed)
Your Plan:  Continue Aricept and Amitriptyline  Memory score improved Take blood pressure medicine as soon as you get home Recheck Blood pressure 1-2 hours after taking medication if it has not normalize- please call PCP or go to ED.   If your symptoms worsen or you develop new symptoms please let us know.   Thank you for coming to see [place] at [facility]. I hope we have been able to provide you high quality care today.  You may receive a patient satisfaction survey over the next few weeks. We would appreciate your feedback and comments so that we may continue to improve ourselves and the health of our patients.

## 2017-12-04 ENCOUNTER — Telehealth: Payer: Self-pay | Admitting: Gastroenterology

## 2017-12-04 ENCOUNTER — Ambulatory Visit: Payer: Medicare HMO

## 2017-12-04 VITALS — BP 140/78

## 2017-12-04 DIAGNOSIS — R110 Nausea: Secondary | ICD-10-CM

## 2017-12-04 DIAGNOSIS — I10 Essential (primary) hypertension: Secondary | ICD-10-CM

## 2017-12-04 MED ORDER — ONDANSETRON 4 MG PO TBDP: 4.0000 mg | ORAL_TABLET | Freq: Four times a day (QID) | ORAL | 1 refills | Status: DC | PRN

## 2017-12-04 NOTE — Telephone Encounter (Signed)
Patient called after hours. She is extremely nauseous, using peptobismol and peppermint without relief. Symptoms ongoing for a week. Asking for prescription for nausea medication. She has had some vomiting. No pain. Unclear what is driving this. No fevers. Will give her some zofran to take as needed. She is seeing her primary care on Monday.  Mary Acosta, giving her some Zofran, maybe someone from the office can call her tomorrow to see if she is feeling better. Thanks

## 2017-12-04 NOTE — Progress Notes (Signed)
Patient stated her blood pressure was 200's over 100's at another doctor's office and wanted to recheck today--but she had not taken her bp med before going to that visit----she is aware to keep endocrinology appt may 1st to get diabetes more controlled and is seeing back specialist for back pain (which requires several pain meds to manage)---her blood pressure is constantly going up and down, which could be from med side effects---her thyroid level is a little low, but no changes to thyroid meds needed at this time---per dr burns, patient has hx of thyroid cancer and they would like to keep her numbers on low side to hopefully prevent reoccurence of thyroid cx---patient is aware to see help at ED if blood pressure not controlled with med or if she becomes symptomatic

## 2017-12-04 NOTE — Telephone Encounter (Signed)
lmtcb  4/11  PKW

## 2017-12-05 ENCOUNTER — Ambulatory Visit: Payer: Medicare HMO | Admitting: Internal Medicine

## 2017-12-05 NOTE — Telephone Encounter (Signed)
Thanks for the note. I know her well, and she has poorly-controlled diabetes with probable gastropathy from that.  She also previously reported worsened bloating from metoclopramide, limiting its use. Appreciate your help.

## 2017-12-05 NOTE — Telephone Encounter (Signed)
I spoke with the patient this morning and her nausea has resolved with the Zofran.  She was very Patent attorney.

## 2017-12-08 ENCOUNTER — Emergency Department (HOSPITAL_COMMUNITY): Payer: Medicare HMO

## 2017-12-08 ENCOUNTER — Telehealth: Payer: Self-pay | Admitting: Physician Assistant

## 2017-12-08 ENCOUNTER — Encounter (HOSPITAL_COMMUNITY): Payer: Self-pay | Admitting: Emergency Medicine

## 2017-12-08 ENCOUNTER — Other Ambulatory Visit: Payer: Self-pay

## 2017-12-08 ENCOUNTER — Emergency Department (HOSPITAL_COMMUNITY)
Admission: EM | Admit: 2017-12-08 | Discharge: 2017-12-09 | Disposition: A | Discharge status: Home / Self Care | Payer: Medicare HMO | Attending: Emergency Medicine | Admitting: Emergency Medicine

## 2017-12-08 DIAGNOSIS — R000 Tachycardia, unspecified: Secondary | ICD-10-CM | POA: Diagnosis not present

## 2017-12-08 DIAGNOSIS — J449 Chronic obstructive pulmonary disease, unspecified: Secondary | ICD-10-CM | POA: Insufficient documentation

## 2017-12-08 DIAGNOSIS — Z8541 Personal history of malignant neoplasm of cervix uteri: Secondary | ICD-10-CM | POA: Diagnosis not present

## 2017-12-08 DIAGNOSIS — Z8673 Personal history of transient ischemic attack (TIA), and cerebral infarction without residual deficits: Secondary | ICD-10-CM | POA: Diagnosis not present

## 2017-12-08 DIAGNOSIS — E1140 Type 2 diabetes mellitus with diabetic neuropathy, unspecified: Secondary | ICD-10-CM | POA: Diagnosis not present

## 2017-12-08 DIAGNOSIS — E039 Hypothyroidism, unspecified: Secondary | ICD-10-CM | POA: Diagnosis not present

## 2017-12-08 DIAGNOSIS — Z96652 Presence of left artificial knee joint: Secondary | ICD-10-CM | POA: Insufficient documentation

## 2017-12-08 DIAGNOSIS — Z794 Long term (current) use of insulin: Secondary | ICD-10-CM | POA: Insufficient documentation

## 2017-12-08 DIAGNOSIS — R002 Palpitations: Secondary | ICD-10-CM | POA: Insufficient documentation

## 2017-12-08 DIAGNOSIS — Z87891 Personal history of nicotine dependence: Secondary | ICD-10-CM | POA: Diagnosis not present

## 2017-12-08 DIAGNOSIS — I10 Essential (primary) hypertension: Secondary | ICD-10-CM | POA: Diagnosis not present

## 2017-12-08 DIAGNOSIS — Z85850 Personal history of malignant neoplasm of thyroid: Secondary | ICD-10-CM | POA: Diagnosis not present

## 2017-12-08 LAB — BASIC METABOLIC PANEL
Anion gap: 12 (ref 5–15)
BUN: 22 mg/dL — ABNORMAL HIGH (ref 6–20)
CHLORIDE: 96 mmol/L — AB (ref 101–111)
CO2: 26 mmol/L (ref 22–32)
CREATININE: 1.02 mg/dL — AB (ref 0.44–1.00)
Calcium: 8.6 mg/dL — ABNORMAL LOW (ref 8.9–10.3)
GFR calc non Af Amer: 58 mL/min — ABNORMAL LOW (ref >60)
Glucose, Bld: 374 mg/dL — ABNORMAL HIGH (ref 65–99)
Potassium: 4.2 mmol/L (ref 3.5–5.1)
Sodium: 134 mmol/L — ABNORMAL LOW (ref 135–145)

## 2017-12-08 LAB — CBC
HCT: 43.3 % (ref 36.0–46.0)
Hemoglobin: 13.2 g/dL (ref 12.0–15.0)
MCH: 24.3 pg — AB (ref 26.0–34.0)
MCHC: 30.5 g/dL (ref 30.0–36.0)
MCV: 79.7 fL (ref 78.0–100.0)
PLATELETS: 188 10*3/uL (ref 150–400)
RBC: 5.43 MIL/uL — AB (ref 3.87–5.11)
RDW: 14.6 % (ref 11.5–15.5)
WBC: 14.3 10*3/uL — ABNORMAL HIGH (ref 4.0–10.5)

## 2017-12-08 LAB — I-STAT TROPONIN, ED: Troponin i, poc: 0 ng/mL (ref 0.00–0.08)

## 2017-12-08 LAB — TSH: TSH: 0.113 u[IU]/mL — ABNORMAL LOW (ref 0.350–4.500)

## 2017-12-08 MED ORDER — METOPROLOL TARTRATE 5 MG/5ML IV SOLN
5.0000 mg | Freq: Once | INTRAVENOUS | Status: AC
  Administered 2017-12-08: 5 mg via INTRAVENOUS
  Filled 2017-12-08: qty 5

## 2017-12-08 MED ORDER — METOPROLOL TARTRATE 25 MG PO TABS: 12.5000 mg | ORAL_TABLET | Freq: Two times a day (BID) | ORAL | 0 refills | Status: DC

## 2017-12-08 NOTE — Telephone Encounter (Signed)
Paged by answering service, patient suddenly noted palpitations this afternoon at 4 pm. Constant since then. Ht in 130s with ambulation it goes to 140s. BP of 160/110s. + for dyspnea no chest pain or syncope. Advised to come to ER for evaluation. She will come to [facility].

## 2017-12-08 NOTE — ED Notes (Signed)
Patient transported to X-ray 

## 2017-12-08 NOTE — ED Provider Notes (Signed)
Assumed care from Dr. Johnney Killian at shift change.  See prior notes for full H&P.  Briefly, 62 y.o. F here with palpitations.  EKG with normal sinus tachycardia, rate 130's.  Given some lopressor here, now NSR rate in the 80's.  Work up thus far reassuring.  Patient able to get up and move around here without recurrent tachycardia or symptoms.  Plan:  Delta trop at 2310, if negative can discharge home with lopressor and cardiology follow-up.  Results for orders placed or performed during the hospital encounter of 32/67/12  Basic metabolic panel  Result Value Ref Range   Sodium 134 (L) 135 - 145 mmol/L   Potassium 4.2 3.5 - 5.1 mmol/L   Chloride 96 (L) 101 - 111 mmol/L   CO2 26 22 - 32 mmol/L   Glucose, Bld 374 (H) 65 - 99 mg/dL   BUN 22 (H) 6 - 20 mg/dL   Creatinine, Ser 1.02 (H) 0.44 - 1.00 mg/dL   Calcium 8.6 (L) 8.9 - 10.3 mg/dL   GFR calc non Af Amer 58 (L) >60 mL/min   GFR calc Af Amer >60 >60 mL/min   Anion gap 12 5 - 15  CBC  Result Value Ref Range   WBC 14.3 (H) 4.0 - 10.5 K/uL   RBC 5.43 (H) 3.87 - 5.11 MIL/uL   Hemoglobin 13.2 12.0 - 15.0 g/dL   HCT 43.3 36.0 - 46.0 %   MCV 79.7 78.0 - 100.0 fL   MCH 24.3 (L) 26.0 - 34.0 pg   MCHC 30.5 30.0 - 36.0 g/dL   RDW 14.6 11.5 - 15.5 %   Platelets 188 150 - 400 K/uL  TSH  Result Value Ref Range   TSH 0.113 (L) 0.350 - 4.500 uIU/mL  I-stat troponin, ED  Result Value Ref Range   Troponin i, poc 0.00 0.00 - 0.08 ng/mL   Comment 3          I-stat troponin, ED  Result Value Ref Range   Troponin i, poc 0.00 0.00 - 0.08 ng/mL   Comment 3           Dg Chest 2 View  Result Date: 12/08/2017 CLINICAL DATA:  Sudden onset palpitations and tachycardia. Shortness of breath. Ex-smoker. EXAM: CHEST - 2 VIEW COMPARISON:  01/31/2017. FINDINGS: Borderline enlarged cardiac silhouette. Clear lungs with normal vascularity. Mild peribronchial thickening. Thoracic spine degenerative changes. Mild left shoulder and minimal right shoulder degenerative  changes. IMPRESSION: 1. No acute abnormality. 2. Borderline cardiomegaly and mild chronic bronchitic changes. Electronically Signed   By: Claudie Revering M.D.   On: 12/08/2017 21:02   Mm Diag Breast Tomo Bilateral  Result Date: 11/10/2017 CLINICAL DATA:  62 year old female with nonfocal pain throughout the lateral left breast for the past 2 years. Remote history of left breast radial scar excision. EXAM: DIGITAL DIAGNOSTIC BILATERAL MAMMOGRAM WITH CAD AND TOMO COMPARISON:  Previous exam(s). ACR Breast Density Category b: There are scattered areas of fibroglandular density. FINDINGS: No suspicious masses or calcifications are seen in either breast. There is no mammographic evidence of malignancy in either breast. Mammographic images were processed with CAD. IMPRESSION: No mammographic evidence of malignancy in either breast. RECOMMENDATION: 1. Recommend further management of the nonfocal left breast pain be based on clinical assessment. 2.  Screening mammogram in one year.(Code:SM-B-01Y) I have discussed the findings and recommendations with the patient. Results were also provided in writing at the conclusion of the visit. If applicable, a reminder letter will be sent to the patient  regarding the next appointment. BI-RADS CATEGORY  1: Negative. Electronically Signed   By: Everlean Alstrom M.D.   On: 11/10/2017 09:50   Delta trop negative.  TSH is low-- lower than prior from 3 months ago.  This may be contributing to her symptoms.  Will have her follow-up with cardiology, use lopressor PRN.  Will also need close follow-up with PCP about thyroid and possible medication adjustments.  Discussed plan with patient, she acknowledged understanding and agreed with plan of care.  Return precautions given for new or worsening symptoms.   Larene Pickett, PA-C 12/09/17 0227    Ward, Delice Bison, DO 12/09/17 [phone]

## 2017-12-08 NOTE — ED Provider Notes (Signed)
[facility] EMERGENCY DEPARTMENT Provider Note   CSN: [id number] Arrival date & time: 12/08/17  1907     History   Chief Complaint Chief Complaint  Patient presents with  . Palpitations    HPI Mary Acosta is a 62 y.o. female.  HPI Reports that she was resting this afternoon.  She was lying in bed and she reports all of a sudden it was like somebody "hit the gas pedal".  Her heart started racing and she checked it on the monitor and it was going from 130s-150s.  Patient denies she had any chest pain.  She denies she felt short of breath.  No syncope.  Patient does take several blood pressure medications.  She also takes chronic pain management.  She reports she is very careful with her medication dosing and schedule.  She reports she is compliant.  No lower extremity swelling or calf pain. Past Medical History:  Diagnosis Date  . AKI (acute kidney injury) ([place]) 01/2017  . Anxiety    with panic attacks  . Arthritis    "back; feet; hands; shoulders" (08/26/2014)  . Asthma   . Cervical cancer ([place])   . Chronic lower back pain   . Chronic narcotic use   . Chronic pain syndrome    [facility]  . Cirrhosis ([place])   . Clostridium difficile infection 2017  . COPD (chronic obstructive pulmonary disease) ([place])   . Daily headache   . Depression   . Diabetic neuropathy ([place]) 06/04/2017  . DJD (degenerative joint disease)   . Fatty liver disease, nonalcoholic   . Fibromyalgia   . Frequency of urination   . HCAP (healthcare-associated pneumonia) 08/26/2014  . History of TIA (transient ischemic attack) 11-01-2010   NO RESIDUAL  . Hyperlipidemia   . Hypertension   . Hypothyroidism   . IDDM (insulin dependent diabetes mellitus) ([place])   . Insomnia   . Lumbar stenosis   . Memory difficulty 07/25/2016  . Nocturia   . OSA (obstructive sleep apnea)    NO CPAP SINCE WT LOSS  . Osteoarthritis    with severe disease in knee  . Pneumonia "several  times"  . Polymyalgia rheumatica ([place])   . Scoliosis   . Seasonal allergies   . Thyroid cancer ([place])   . Urgency of urination   . Vaginal pain S/P SLING  FEB 2012    Patient Active Problem List   Diagnosis Date Noted  . Obesity hypoventilation syndrome ([place]) 09/24/2017  . Abnormal urine odor 09/20/2017  . Palpitations 09/18/2017  . Diabetic neuropathy ([place]) 06/04/2017  . Vitamin B12 deficiency 03/05/2017  . Numbness and tingling in both hands 02/24/2017  . Acute kidney injury ([place]) 01/31/2017  . OSA (obstructive sleep apnea) 01/31/2017  . Liver cirrhosis secondary to NASH ([place]) 01/31/2017  . Hypothyroidism 01/31/2017  . Uncontrolled type 2 diabetes mellitus with insulin therapy ([place]) 01/31/2017  . Dehydration, moderate 01/31/2017  . Chronic narcotic use 01/31/2017  . Fibromyalgia 01/31/2017  . Hyperlipidemia 01/31/2017  . Hypotension 01/31/2017  . Gastroparesis   . Memory disorder 07/25/2016  . Pain in toe 04/09/2016  . Lump in neck 01/29/2016  . Chronic diastolic (congestive) heart failure ([place]) 01/03/2016  . Fatty liver 01/03/2016  . Type 1 diabetes mellitus ([place]) 01/03/2016  . Recurrent Clostridium difficile diarrhea 01/03/2016  . Cirrhosis of liver without ascites ([place]) 12/07/2015  . Chronic respiratory failure ([place]) 11/10/2014  . Physical deconditioning 09/26/2014  . Dyspnea 09/26/2014  .  HCAP (healthcare-associated pneumonia) 08/26/2014  . COPD (chronic obstructive pulmonary disease) ([place]) 08/26/2014  . Iron deficiency anemia, unspecified  04/05/2011  . Bariatric surgery status 04/05/2011  . Left arm pain   . UNSPECIFIED VITAMIN D DEFICIENCY 10/22/2007  . LOW BACK PAIN, CHRONIC 10/22/2007  . INSOMNIA 10/22/2007  . Obesity 10/14/2007  . Chronic pain syndrome 10/14/2007  . CARPAL TUNNEL SYNDROME 10/14/2007  . PERIPHERAL NEUROPATHY 10/14/2007  . ALLERGIC RHINITIS CAUSE UNSPECIFIED 10/14/2007  . ARTHRITIS 10/14/2007  . Anxiety state 08/25/2007  . Depression  08/25/2007  . Essential hypertension 08/25/2007  . ASTHMA 08/25/2007  . CONSTIPATION 08/25/2007  . POLYMYALGIA RHEUMATICA 08/25/2007  . LEG EDEMA, BILATERAL 08/25/2007    Past Surgical History:  Procedure Laterality Date  . APPENDECTOMY  1982  . BREAST EXCISIONAL BIOPSY Left 02/28/2005   Atypical Ductal Hyperplasia  . CARDIAC CATHETERIZATION  09-04-2004   NORMAL CORONARY ANATOMY/ NORMAL LVF/ EF 60%  . CARDIOVASCULAR STRESS TEST  12-27-2010  DR [place]   ABNORMAL NUCLEAR STUDY W/ /MILD INFERIOR ISCHEMIA/ EF 69%/  CT HEART ANGIOGRAM ;  NO ACUTE FINDINGS  . CRYOABLATION  05/16/2003   w/LEEP FOR ABNORMAL PAP SMEAR  . CYSTOSCOPY  05/18/2012   Procedure: CYSTOSCOPY;  Surgeon: Reece Packer, MD;  Location: [facility];  Service: Urology;  Laterality: N/A;  examination under anethesia  . ESOPHAGOGASTRODUODENOSCOPY (EGD) WITH PROPOFOL N/A 09/03/2016   Procedure: ESOPHAGOGASTRODUODENOSCOPY (EGD) WITH PROPOFOL;  Surgeon: Doran Stabler, MD;  Location: [facility];  Service: Gastroenterology;  Laterality: N/A;  . HYSTEROSCOPY W/D&C  08-19-2007   PMB  . KNEE ARTHROSCOPY W/ DEBRIDEMENT Left 03/29/2006   INTERNAL DERANGEMENT/ SEVERE DJD/ MENISCUS TEARS  . LAPAROSCOPIC CHOLECYSTECTOMY  06-10-2002  . LAPAROSCOPIC GASTRIC BANDING  03/01/2006   TRUNCAL VAGOTOMY/ PLACEMENT OF VG BAND  . REVISION TOTAL KNEE ARTHROPLASTY Left 08-29-2008; 05/2009  . TONSILLECTOMY  1969  . TOTAL KNEE ARTHROPLASTY Left 01-23-2007   SEVERE DJD  . TOTAL THYROIDECTOMY  11-22-2005   BILATERAL THYROID NODULES-- PAPILLARY CARCINOMA (0.5CM)/ ADENOMATOID NODULES  . TRANSTHORACIC ECHOCARDIOGRAM  12-27-2010   LVSF NORMAL / EF 29-51%/ GRADE I DIASTOLIC DYSFUNCTION/ MILD MITRAL REGURG. / MILDLY DILATED LEFT ATRIUM/ MILDY INCREASED SYSTOLIC PRESSURE OF PULMONARY ARTERIES  . TRANSVAGINAL SUBURETERAL TAPE/ SLING  09-28-2010   MIXED URINARY INCONTINENCE  . TUBAL LIGATION  1983     OB History   None      Home  Medications    Prior to Admission medications   Medication Sig Start Date End Date Taking? Authorizing Provider  albuterol (PROVENTIL HFA;VENTOLIN HFA) 108 (90 Base) MCG/ACT inhaler Inhale 2 puffs into the lungs every 6 (six) hours as needed for wheezing or shortness of breath. 06/02/17  Yes Brand Males, MD  amitriptyline (ELAVIL) 50 MG tablet Take 2 tablets (100 mg total) by mouth at bedtime. 06/04/17  Yes Kathrynn Ducking, MD  amLODipine (NORVASC) 5 MG tablet Take 1 tablet (5 mg total) by mouth 2 (two) times daily. 08/06/17 12/08/17 Yes Burns, Claudina Lick, MD  atorvastatin (LIPITOR) 40 MG tablet TAKE 1 TABLET BY MOUTH EVERY DAY 09/15/17  Yes Crenshaw, Denice Bors, MD  busPIRone (BUSPAR) 7.5 MG tablet Take 1 tablet (7.5 mg total) by mouth 3 (three) times daily. Patient taking differently: Take 7.5 mg by mouth 3 (three) times daily as needed (anxiety).  05/29/17  Yes Burns, Claudina Lick, MD  donepezil (ARICEPT) 10 MG tablet Take 1 tablet (10 mg total) at bedtime by mouth. 06/30/17  Yes Willis,  Elon Alas, MD  hydrALAZINE (APRESOLINE) 50 MG tablet Take 1 tablet (50 mg total) by mouth 3 (three) times daily. 09/24/17  Yes Almyra Deforest, PA  HYDROmorphone (DILAUDID) 4 MG tablet Take 1 tablet (4 mg total) by mouth every 8 (eight) hours as needed for moderate pain. Patient taking differently: Take 4 mg by mouth 3 (three) times daily.  02/02/17  Yes Hongalgi, Lenis Dickinson, MD  insulin NPH Human (NOVOLIN N) 100 UNIT/ML injection Inject 0.3 mLs (30 Units total) into the skin 2 (two) times daily before a meal. Patient taking differently: Inject 40 Units into the skin 2 (two) times daily before a meal.  02/02/17  Yes Hongalgi, Lenis Dickinson, MD  insulin regular (NOVOLIN R,HUMULIN R) 100 units/mL injection Inject 20 Units into the skin 2 (two) times daily.   Yes [provider]  Insulin Syringe-Needle U-100 (INSULIN SYRINGE 1CC/30GX5/16") 30G X 5/16" 1 ML MISC Use as directed for insulin injections 5 times daily 01/29/16  Yes Burns,  Claudina Lick, MD  Ipratropium-Albuterol (COMBIVENT RESPIMAT) 20-100 MCG/ACT AERS respimat Inhale 1 puff into the lungs every 6 (six) hours. Patient taking differently: Inhale 1 puff into the lungs every 6 (six) hours as needed for wheezing or shortness of breath.  10/27/17  Yes Magdalen Spatz, NP  ipratropium-albuterol (DUONEB) 0.5-2.5 (3) MG/3ML SOLN Take 3 mLs by nebulization every 6 (six) hours as needed (Wheezing or dyspnea.). 02/02/17  Yes Hongalgi, Lenis Dickinson, MD  levothyroxine (SYNTHROID, LEVOTHROID) 175 MCG tablet Take 1 tablet by mouth six days a week before breakfast Patient taking differently: Take 175 mcg by mouth daily.  11/24/17  Yes Burns, Claudina Lick, MD  Lidocaine HCl (ASPERCREME LIDOCAINE) 4 % LIQD Apply 1 application topically daily as needed (pain).   Yes [provider]  meloxicam (MOBIC) 7.5 MG tablet Take 15 mg by mouth daily.  06/30/15  Yes [provider]  morphine (MS CONTIN) 30 MG 12 hr tablet Take 30 mg by mouth 2 (two) times daily. 09/12/15  Yes [provider]  ondansetron (ZOFRAN-ODT) 4 MG disintegrating tablet Take 1 tablet (4 mg total) by mouth every 6 (six) hours as needed for nausea or vomiting. 12/04/17  Yes Armbruster, Carlota Raspberry, MD  potassium chloride SA (KLOR-CON M20) 20 MEQ tablet TAKE 2 TABLETS EVERY DAY AS NEEDED FOR CRAMPING 11/12/17  Yes Burns, Claudina Lick, MD  tiZANidine (ZANAFLEX) 2 MG tablet Take 2 mg by mouth 3 (three) times daily as needed for muscle spasms.  05/30/15  Yes [provider]  amoxicillin-clavulanate (AUGMENTIN) 875-125 MG tablet Take 1 tablet by mouth 2 (two) times daily. Patient not taking: Reported on 12/08/2017 12/01/17   Binnie Rail, MD  metoprolol tartrate (LOPRESSOR) 25 MG tablet Take 0.5 tablets (12.5 mg total) by mouth 2 (two) times daily. 12/08/17   Charlesetta Shanks, MD    Family History Family History  Problem Relation Age of Onset  . Diabetes Mother   . Heart disease Mother   . Dementia Mother   . Heart disease  Father   . High blood pressure Father   . Colon cancer Maternal Uncle        x 2  . Breast cancer Other        great aunts x 5    Social History Social History   Tobacco Use  . Smoking status: Former Smoker    Packs/day: 2.50    Years: 39.00    Pack years: 97.50    Types: Cigarettes  Last attempt to quit: 10/22/2010    Years since quitting: 7.1  . Smokeless tobacco: Never Used  Substance Use Topics  . Alcohol use: No    Alcohol/week: 0.0 oz  . Drug use: No     Allergies   Gabapentin; Losartan; Oxycodone; Sulfa antibiotics; and Sulfonamide derivatives   Review of Systems Review of Systems 10 Systems reviewed and are negative for acute change except as noted in the HPI.   Physical Exam Updated Vital Signs BP 116/71   Pulse 80   Temp 98.9 F (37.2 C) (Oral)   Resp 19   Ht 5\' 9"  (1.753 m)   Wt 126.6 kg (279 lb)   SpO2 92%   BMI 41.20 kg/m   Physical Exam  Constitutional: She is oriented to person, place, and time. She appears well-developed and well-nourished.  Patient is alert and nontoxic.  No respiratory distress.  Mental status clear.  Central obesity.  HENT:  Head: Normocephalic and atraumatic.  Mouth/Throat: Oropharynx is clear and moist.  Eyes: Pupils are equal, round, and reactive to light. EOM are normal.  Neck: Neck supple.  Cardiovascular: Regular rhythm, normal heart sounds and intact distal pulses.  Tachycardia.  Pulmonary/Chest: Effort normal and breath sounds normal.  Abdominal: Soft. Bowel sounds are normal. She exhibits no distension. There is no tenderness.  Musculoskeletal: Normal range of motion. She exhibits no edema or tenderness.  Neurological: She is alert and oriented to person, place, and time. She has normal strength. No cranial nerve deficit. She exhibits normal muscle tone. Coordination normal. GCS eye subscore is 4. GCS verbal subscore is 5. GCS motor subscore is 6.  Skin: Skin is warm, dry and intact.  Psychiatric: She has a  normal mood and affect.     ED Treatments / Results  Labs (all labs ordered are listed, but only abnormal results are displayed) Labs Reviewed  BASIC METABOLIC PANEL - Abnormal; Notable for the following components:      Result Value   Sodium 134 (*)    Chloride 96 (*)    Glucose, Bld 374 (*)    BUN 22 (*)    Creatinine, Ser 1.02 (*)    Calcium 8.6 (*)    GFR calc non Af Amer 58 (*)    All other components within normal limits  CBC - Abnormal; Notable for the following components:   WBC 14.3 (*)    RBC 5.43 (*)    MCH 24.3 (*)    All other components within normal limits  TSH  I-STAT TROPONIN, ED    EKG EKG Interpretation  Date/Time:  Monday December 08 2017 19:20:01 EDT Ventricular Rate:  150 PR Interval:    QRS Duration: 124 QT Interval:  316 QTC Calculation: 499 R Axis:   55 Text Interpretation:  Wide QRS tachycardia Right bundle branch block Possible Inferior infarct , age undetermined Anterolateral infarct , age undetermined Abnormal ECG agree. old RBBB. no STEMI Confirmed by Charlesetta Shanks [phone]) on 12/08/2017 8:36:45 PM   Radiology Dg Chest 2 View  Result Date: 12/08/2017 CLINICAL DATA:  Sudden onset palpitations and tachycardia. Shortness of breath. Ex-smoker. EXAM: CHEST - 2 VIEW COMPARISON:  01/31/2017. FINDINGS: Borderline enlarged cardiac silhouette. Clear lungs with normal vascularity. Mild peribronchial thickening. Thoracic spine degenerative changes. Mild left shoulder and minimal right shoulder degenerative changes. IMPRESSION: 1. No acute abnormality. 2. Borderline cardiomegaly and mild chronic bronchitic changes. Electronically Signed   By: Claudie Revering M.D.   On: 12/08/2017 21:02  Procedures Procedures (including critical care time) CRITICAL CARE Performed by: Si Gaul   Total critical care time: 30 minutes  Critical care time was exclusive of separately billable procedures and treating other patients.  Critical care was necessary to  treat or prevent imminent or life-threatening deterioration.  Critical care was time spent personally by me on the following activities: development of treatment plan with patient and/or surrogate as well as nursing, discussions with consultants, evaluation of patient's response to treatment, examination of patient, obtaining history from patient or surrogate, ordering and performing treatments and interventions, ordering and review of laboratory studies, ordering and review of radiographic studies, pulse oximetry and re-evaluation of patient's condition. Medications Ordered in ED Medications  metoprolol tartrate (LOPRESSOR) injection 5 mg (5 mg Intravenous Given 12/08/17 2147)     Initial Impression / Assessment and Plan / ED Course  I have reviewed the triage vital signs and the nursing notes.  Pertinent labs & imaging results that were available during my care of the patient were reviewed by me and considered in my medical decision making (see chart for details).    Recheck: 5 mg of Lopressor, patient has normal sinus rhythm in the 70s and 80s.  She reports she feels back to normal and no longer perceives palpitations.  She can get up and move around without symptoms.  First troponin is negative.  Plan will be to repeat troponin and if remains stable, discharge with home metoprolol and close follow-up with cardiology.  Final Clinical Impressions(s) / ED Diagnoses   Final diagnoses:  Palpitations  Tachycardia  Present with tachycardia ranging in the 130s-150s.  It did have regular appearance.  No etiology for the patient to have sinus tachycardia due to volume depletion, infection or pulmonary embolus.  Patient was very stable in appearance with the tachycardia.  She was given Lopressor 5 mg with normalization to sinus rhythm in the 70s and 80s.  First set of cardiac enzymes are negative.  At this time I feel patient will be stable for discharge a second set of enzymes negative and no further  episodes.  ED Discharge Orders        Ordered    metoprolol tartrate (LOPRESSOR) 25 MG tablet  2 times daily     12/08/17 2243       Charlesetta Shanks, MD 12/08/17 2246

## 2017-12-08 NOTE — ED Notes (Signed)
ED Provider at bedside. 

## 2017-12-08 NOTE — Discharge Instructions (Addendum)
1.  Take metoprolol 12.5 mg twice daily.  Decrease your hydralazine to half your usual dose.  Continue to monitor your blood pressures carefully.  If your blood pressure is less than 130, do not take your hydralazine dose.  If your heart rate is less than 65, do not take the metoprolol. 2.  Call your cardiologist in the morning to schedule follow-up this week. 3.  Your thyroid numbers are still a little off-- this can contribute to your palpitations.  I would follow-up with your primary care doctor about this to see if medication needs to be adjusted.

## 2017-12-08 NOTE — ED Triage Notes (Signed)
Pt presents with sudden onset of palpitations and heart racing; pt states she was lying in bed when it began; feeling warm, sob; denies CP

## 2017-12-09 ENCOUNTER — Ambulatory Visit: Payer: Medicare HMO | Admitting: Internal Medicine

## 2017-12-09 ENCOUNTER — Telehealth: Payer: Self-pay | Admitting: Internal Medicine

## 2017-12-09 ENCOUNTER — Telehealth: Payer: Self-pay | Admitting: Cardiology

## 2017-12-09 DIAGNOSIS — E039 Hypothyroidism, unspecified: Secondary | ICD-10-CM | POA: Diagnosis not present

## 2017-12-09 DIAGNOSIS — J449 Chronic obstructive pulmonary disease, unspecified: Secondary | ICD-10-CM | POA: Diagnosis not present

## 2017-12-09 DIAGNOSIS — Z8673 Personal history of transient ischemic attack (TIA), and cerebral infarction without residual deficits: Secondary | ICD-10-CM | POA: Diagnosis not present

## 2017-12-09 DIAGNOSIS — R002 Palpitations: Secondary | ICD-10-CM | POA: Diagnosis not present

## 2017-12-09 DIAGNOSIS — E1140 Type 2 diabetes mellitus with diabetic neuropathy, unspecified: Secondary | ICD-10-CM | POA: Diagnosis not present

## 2017-12-09 DIAGNOSIS — I10 Essential (primary) hypertension: Secondary | ICD-10-CM | POA: Diagnosis not present

## 2017-12-09 DIAGNOSIS — R000 Tachycardia, unspecified: Secondary | ICD-10-CM | POA: Diagnosis not present

## 2017-12-09 DIAGNOSIS — Z8541 Personal history of malignant neoplasm of cervix uteri: Secondary | ICD-10-CM | POA: Diagnosis not present

## 2017-12-09 DIAGNOSIS — Z794 Long term (current) use of insulin: Secondary | ICD-10-CM | POA: Diagnosis not present

## 2017-12-09 DIAGNOSIS — Z85850 Personal history of malignant neoplasm of thyroid: Secondary | ICD-10-CM | POA: Diagnosis not present

## 2017-12-09 LAB — I-STAT TROPONIN, ED: TROPONIN I, POC: 0 ng/mL (ref 0.00–0.08)

## 2017-12-09 NOTE — Telephone Encounter (Signed)
Lmtcb x1 for pt  

## 2017-12-09 NOTE — Telephone Encounter (Signed)
Pt returned call. Verified that pt has not seen CY or any other sleep doctors at this office. Pt sched for a Sleep Consultation apt with Dr. Elsworth Soho for 5/17.

## 2017-12-09 NOTE — Telephone Encounter (Signed)
New message:     Pt states she was in the ER on last night with Tachycardia and high blood pressure. Pt states they would like for her to be seen today.

## 2017-12-09 NOTE — Telephone Encounter (Signed)
Will route to [place] for [place]. Nothing further needed.

## 2017-12-09 NOTE — Telephone Encounter (Signed)
Spoke with pts husband, follow up appointment scheduled with extender. They will track her bp and bring those to her follow up appt as well as all her medications.

## 2017-12-09 NOTE — Telephone Encounter (Signed)
Called and spoke with patient she is aware that CY doesn't have another appointment until July. Patient states that she was unable to come in today due to being in the hospital late last night.   Routing this to both Katie and CY to see if patient can be worked in or if we can just give her HST results over the phone.

## 2017-12-09 NOTE — Telephone Encounter (Signed)
I don't think I have ever seen this patient. I think Dr Elsworth Soho read her sleep study. He may have an earlier appointment time to offer. If not, LMK.

## 2017-12-10 DIAGNOSIS — J449 Chronic obstructive pulmonary disease, unspecified: Secondary | ICD-10-CM | POA: Diagnosis not present

## 2017-12-11 ENCOUNTER — Ambulatory Visit: Payer: Medicare HMO | Admitting: Physician Assistant

## 2017-12-11 ENCOUNTER — Encounter: Payer: Self-pay | Admitting: Physician Assistant

## 2017-12-11 VITALS — BP 124/68 | HR 66 | Ht 69.0 in | Wt 286.0 lb

## 2017-12-11 DIAGNOSIS — I472 Ventricular tachycardia: Secondary | ICD-10-CM

## 2017-12-11 DIAGNOSIS — I10 Essential (primary) hypertension: Secondary | ICD-10-CM | POA: Diagnosis not present

## 2017-12-11 DIAGNOSIS — R000 Tachycardia, unspecified: Secondary | ICD-10-CM

## 2017-12-11 MED ORDER — HYDRALAZINE HCL 25 MG PO TABS: 25.0000 mg | ORAL_TABLET | Freq: Two times a day (BID) | ORAL | 3 refills | Status: DC

## 2017-12-11 MED ORDER — METOPROLOL TARTRATE 25 MG PO TABS: 25.0000 mg | ORAL_TABLET | Freq: Two times a day (BID) | ORAL | 3 refills | Status: DC

## 2017-12-11 NOTE — Patient Instructions (Signed)
Medication Instructions:  INCREASE- Metoprolol Tartrate 25 mg (1 tablet) twice a day RESTART- Hydralazine 25 mg twice a day  If you need a refill on your cardiac medications before your next appointment, please call your pharmacy.  Labwork: None Ordered   Testing/Procedures: Your physician has recommended that you wear an event monitor. Event monitors are medical devices that record the heart's electrical activity. Doctors most often [place] these monitors to diagnose arrhythmias. Arrhythmias are problems with the speed or rhythm of the heartbeat. The monitor is a small, portable device. You can wear one while you do your normal daily activities. This is usually used to diagnose what is causing palpitations/syncope (passing out).  Follow-Up: Your physician wants you to follow-up in: 2 Months with Dr Stanford Breed.     Thank you for choosing [facility] at [facility]!!

## 2017-12-11 NOTE — Progress Notes (Signed)
Cardiology Office Note   Date:  12/11/2017   ID:  Mary Acosta, DOB [birth year], MRN [id number]  PCP:  Binnie Rail, MD  Cardiologist: Dr. Stanford Breed, 09/05/2016 Rosaria Ferries, PA-C   Chief Complaint  Patient presents with  . Follow-up    History of Present Illness: Mary Acosta is a 62 y.o. female with a history of HTN, HLD, IDDM, hypothyroidism, h/o TIA, polymyalgia rheumatica and COPD, nl cors at cath 2006, no sig carotid dz 2012, MV 2012 w/ ?inf isch>>cardiac CT w/ calcium score 0 and no CAD, sleep study w/out OSA but desats>>nocturnal O2 3 lpm  1/30 office visit, hydralazine increased, patient describing waking during the night with shortness of breath and heart pounding, possible obesity hypoventilation 04/15 ER visit for palpitations which started at rest, heart rate 130s-150s wide QRS tachycardia with a right bundle branch block seen, 5 mg Lopressor given and patient went back into sinus rhythm  Mary Acosta presents for cardiology follow up.  She has not been taking the hydralazine 50 mg, she was afraid to. She is willing to restart it.   Her TSH was low when checked 12/08/2017, she has an appt w/ Dr Loanne Drilling 05/01.   She is taking the metoprolol at 1/2 tab 2 x daily, willing to take a whole tab twice a day.   The night she went to the ER, sx started at rest, she felt her heart speed up suddenly and it stayed that way till she was treated in the ER. She has never had sx like this before.  She has had palpitations in the past, but they were never like this.  She has anxiety, takes Buspar. Never had sx like this with an anxiety attack.   She has not been having any chest pain.  Her dyspnea on exertion is chronic with no recent change.   Past Medical History:  Diagnosis Date  . AKI (acute kidney injury) ([place]) 01/2017  . Anxiety    with panic attacks  . Arthritis    "back; feet; hands; shoulders" (08/26/2014)  . Asthma   . Cervical cancer  ([place])   . Chronic lower back pain   . Chronic narcotic use   . Chronic pain syndrome    [facility]  . Cirrhosis ([place])   . Clostridium difficile infection 2017  . COPD (chronic obstructive pulmonary disease) ([place])   . Daily headache   . Depression   . Diabetic neuropathy ([place]) 06/04/2017  . DJD (degenerative joint disease)   . Fatty liver disease, nonalcoholic   . Fibromyalgia   . Frequency of urination   . HCAP (healthcare-associated pneumonia) 08/26/2014  . History of TIA (transient ischemic attack) 11-01-2010   NO RESIDUAL  . Hyperlipidemia   . Hypertension   . Hypothyroidism   . IDDM (insulin dependent diabetes mellitus) ([place])   . Insomnia   . Lumbar stenosis   . Memory difficulty 07/25/2016  . Nocturia   . OSA (obstructive sleep apnea)    NO CPAP SINCE WT LOSS  . Osteoarthritis    with severe disease in knee  . Pneumonia "several times"  . Polymyalgia rheumatica ([place])   . Scoliosis   . Seasonal allergies   . Thyroid cancer ([place])   . Urgency of urination   . Vaginal pain S/P SLING  FEB 2012    Past Surgical History:  Procedure Laterality Date  . APPENDECTOMY  1982  . BREAST EXCISIONAL BIOPSY  Left 02/28/2005   Atypical Ductal Hyperplasia  . CARDIAC CATHETERIZATION  09-04-2004   NORMAL CORONARY ANATOMY/ NORMAL LVF/ EF 60%  . CARDIOVASCULAR STRESS TEST  12-27-2010  DR [place]   ABNORMAL NUCLEAR STUDY W/ /MILD INFERIOR ISCHEMIA/ EF 69%/  CT HEART ANGIOGRAM ;  NO ACUTE FINDINGS  . CRYOABLATION  05/16/2003   w/LEEP FOR ABNORMAL PAP SMEAR  . CYSTOSCOPY  05/18/2012   Procedure: CYSTOSCOPY;  Surgeon: Reece Packer, MD;  Location: [facility];  Service: Urology;  Laterality: N/A;  examination under anethesia  . ESOPHAGOGASTRODUODENOSCOPY (EGD) WITH PROPOFOL N/A 09/03/2016   Procedure: ESOPHAGOGASTRODUODENOSCOPY (EGD) WITH PROPOFOL;  Surgeon: Doran Stabler, MD;  Location: [facility];  Service: Gastroenterology;  Laterality: N/A;  .  HYSTEROSCOPY W/D&C  08-19-2007   PMB  . KNEE ARTHROSCOPY W/ DEBRIDEMENT Left 03/29/2006   INTERNAL DERANGEMENT/ SEVERE DJD/ MENISCUS TEARS  . LAPAROSCOPIC CHOLECYSTECTOMY  06-10-2002  . LAPAROSCOPIC GASTRIC BANDING  03/01/2006   TRUNCAL VAGOTOMY/ PLACEMENT OF VG BAND  . REVISION TOTAL KNEE ARTHROPLASTY Left 08-29-2008; 05/2009  . TONSILLECTOMY  1969  . TOTAL KNEE ARTHROPLASTY Left 01-23-2007   SEVERE DJD  . TOTAL THYROIDECTOMY  11-22-2005   BILATERAL THYROID NODULES-- PAPILLARY CARCINOMA (0.5CM)/ ADENOMATOID NODULES  . TRANSTHORACIC ECHOCARDIOGRAM  12-27-2010   LVSF NORMAL / EF 32-95%/ GRADE I DIASTOLIC DYSFUNCTION/ MILD MITRAL REGURG. / MILDLY DILATED LEFT ATRIUM/ MILDY INCREASED SYSTOLIC PRESSURE OF PULMONARY ARTERIES  . TRANSVAGINAL SUBURETERAL TAPE/ SLING  09-28-2010   MIXED URINARY INCONTINENCE  . TUBAL LIGATION  1983    Current Outpatient Medications  Medication Sig Dispense Refill  . albuterol (PROVENTIL HFA;VENTOLIN HFA) 108 (90 Base) MCG/ACT inhaler Inhale 2 puffs into the lungs every 6 (six) hours as needed for wheezing or shortness of breath. 1 Inhaler 2  . amitriptyline (ELAVIL) 50 MG tablet Take 2 tablets (100 mg total) by mouth at bedtime.    Marland Kitchen amoxicillin-clavulanate (AUGMENTIN) 875-125 MG tablet Take 1 tablet by mouth 2 (two) times daily. 14 tablet 0  . atorvastatin (LIPITOR) 40 MG tablet TAKE 1 TABLET BY MOUTH EVERY DAY 90 tablet 3  . busPIRone (BUSPAR) 7.5 MG tablet Take 1 tablet (7.5 mg total) by mouth 3 (three) times daily. (Patient taking differently: Take 7.5 mg by mouth 3 (three) times daily as needed (anxiety). ) 270 tablet 0  . donepezil (ARICEPT) 10 MG tablet Take 1 tablet (10 mg total) at bedtime by mouth. 90 tablet 3  . hydrALAZINE (APRESOLINE) 50 MG tablet Take 1 tablet (50 mg total) by mouth 3 (three) times daily. 90 tablet 5  . HYDROmorphone (DILAUDID) 4 MG tablet Take 1 tablet (4 mg total) by mouth every 8 (eight) hours as needed for moderate pain. (Patient  taking differently: Take 4 mg by mouth 3 (three) times daily. )    . insulin NPH Human (NOVOLIN N) 100 UNIT/ML injection Inject 0.3 mLs (30 Units total) into the skin 2 (two) times daily before a meal. (Patient taking differently: Inject 40 Units into the skin 2 (two) times daily before a meal. )    . insulin regular (NOVOLIN R,HUMULIN R) 100 units/mL injection Inject 20 Units into the skin 2 (two) times daily.    . Insulin Syringe-Needle U-100 (INSULIN SYRINGE 1CC/30GX5/16") 30G X 5/16" 1 ML MISC Use as directed for insulin injections 5 times daily 150 each 1  . Ipratropium-Albuterol (COMBIVENT RESPIMAT) 20-100 MCG/ACT AERS respimat Inhale 1 puff into the lungs every 6 (six) hours. (Patient taking differently: Inhale  1 puff into the lungs every 6 (six) hours as needed for wheezing or shortness of breath. ) 1 Inhaler 3  . ipratropium-albuterol (DUONEB) 0.5-2.5 (3) MG/3ML SOLN Take 3 mLs by nebulization every 6 (six) hours as needed (Wheezing or dyspnea.).    Marland Kitchen levothyroxine (SYNTHROID, LEVOTHROID) 175 MCG tablet Take 1 tablet by mouth six days a week before breakfast (Patient taking differently: Take 175 mcg by mouth daily. ) 90 tablet 0  . Lidocaine HCl (ASPERCREME LIDOCAINE) 4 % LIQD Apply 1 application topically daily as needed (pain).    . meloxicam (MOBIC) 7.5 MG tablet Take 15 mg by mouth daily.   5  . metoprolol tartrate (LOPRESSOR) 25 MG tablet Take 0.5 tablets (12.5 mg total) by mouth 2 (two) times daily. 30 tablet 0  . morphine (MS CONTIN) 30 MG 12 hr tablet Take 30 mg by mouth 2 (two) times daily.    . ondansetron (ZOFRAN-ODT) 4 MG disintegrating tablet Take 1 tablet (4 mg total) by mouth every 6 (six) hours as needed for nausea or vomiting. 20 tablet 1  . potassium chloride SA (KLOR-CON M20) 20 MEQ tablet TAKE 2 TABLETS EVERY DAY AS NEEDED FOR CRAMPING 180 tablet 1  . tiZANidine (ZANAFLEX) 2 MG tablet Take 2 mg by mouth 3 (three) times daily as needed for muscle spasms.   2  . amLODipine  (NORVASC) 5 MG tablet Take 1 tablet (5 mg total) by mouth 2 (two) times daily. 180 tablet 1   No current facility-administered medications for this visit.     Allergies:   Gabapentin; Losartan; Oxycodone; Sulfa antibiotics; and Sulfonamide derivatives    Social History:  The patient  reports that she quit smoking about 7 years ago. Her smoking use included cigarettes. She has a 97.50 pack-year smoking history. She has never used smokeless tobacco. She reports that she does not drink alcohol or use drugs.   Family History:  The patient's family history includes Breast cancer in her other; Colon cancer in her maternal uncle; Dementia in her mother; Diabetes in her mother; Heart disease in her father and mother; High blood pressure in her father.    ROS:  Please see the history of present illness. All other systems are reviewed and negative.    PHYSICAL EXAM: VS:  BP 124/68 (BP Location: Left Arm, Patient Position: Sitting, Cuff Size: Large)   Pulse 66   Ht 5\' 9"  (1.753 m)   Wt 286 lb (129.7 kg)   BMI 42.23 kg/m  , BMI Body mass index is 42.23 kg/m. GEN: Well nourished, well developed, female in no acute distress  HEENT: normal for age  Neck: no JVD, no carotid bruit, no masses Cardiac: RRR; no murmur, no rubs, or gallops Respiratory:  clear to auscultation bilaterally, normal work of breathing GI: soft, nontender, nondistended, + BS MS: no deformity or atrophy; trace lower extremity edema; distal pulses are 2+ in all 4 extremities   Skin: warm and dry, no rash; chronic stasis changes in both lower extremities Neuro:  Strength and sensation are intact Psych: euthymic mood, full affect   EKG:  EKG is ordered today. The ekg ordered today demonstrates sinus rhythm, heart rate 66, right bundle branch block which is old  ECHO: 09/24/2016 - Left ventricle: The cavity size was normal. Wall thickness was   increased in a pattern of mild LVH. Systolic function was normal.   The estimated  ejection fraction was in the range of 50% to 55%.   Wall motion  was normal; there were no regional wall motion   abnormalities. Doppler parameters are consistent with abnormal   left ventricular relaxation (grade 1 diastolic dysfunction). - Aortic valve: Transvalvular velocity was within the normal range.   There was no stenosis. There was no regurgitation. - Mitral valve: Transvalvular velocity was within the normal range.   There was no evidence for stenosis. There was no regurgitation. - Right ventricle: The cavity size was normal. Wall thickness was   normal. Systolic function was normal. - Tricuspid valve: There was no regurgitation. - Pulmonary arteries: Systolic pressure was within the normal   range. PA peak pressure: 22 mm Hg (S).  Recent Labs: 01/31/2017: B Natriuretic Peptide 16.8 02/05/2017: Magnesium 1.5 09/19/2017: ALT 13 12/08/2017: BUN 22; Creatinine, Ser 1.02; Hemoglobin 13.2; Platelets 188; Potassium 4.2; Sodium 134; TSH 0.113    Lipid Panel No results found for: CHOL, TRIG, HDL, CHOLHDL, VLDL, LDLCALC, LDLDIRECT   Wt Readings from Last 3 Encounters:  12/11/17 286 lb (129.7 kg)  12/08/17 279 lb (126.6 kg)  12/03/17 279 lb (126.6 kg)     Other studies Reviewed: Additional studies/ records that were reviewed today include: Office notes, hospital records and testing.  ASSESSMENT AND PLAN:  1.  WCT>>SVT: Her heart rate was 150, but I am unable to detect flutter waves.  Dr. Claiborne Billings reviewed the ECG with me and was also unable to detect flutter waves.  - Therefore, this is most likely SVT and no anticoagulation is indicated. -  Increase the beta-blocker and get an event monitor to see if this is an ongoing problem. **Dr. Stanford Breed to review the ECG and advise any treatment changes needed**  2.  Hypertension: Her blood pressure is not well controlled today.  She was afraid to take the hydralazine, reasons unclear.  I have encouraged her to restart the hydralazine at 25 mg,  will do twice a day for now for medication compliance.  If her blood pressures not well controlled, increase to 50 mg or increase to 3 times daily.  Current medicines are reviewed at length with the patient today.  The patient has concerns regarding medicines.  Concerns were addressed  The following changes have been made: Increase beta-blocker, restart hydralazine  Labs/ tests ordered today include:   Orders Placed This Encounter  Procedures  . CARDIAC EVENT MONITOR  . EKG 12-Lead     Disposition:   FU with Dr. Stanford Breed  Signed, Rosaria Ferries, PA-C  12/11/2017 5:08 PM    [place] Phone: [phone]; Fax: [phone]  This note was written with the assistance of speech recognition software. Please excuse any transcriptional errors.

## 2017-12-12 DIAGNOSIS — J449 Chronic obstructive pulmonary disease, unspecified: Secondary | ICD-10-CM | POA: Diagnosis not present

## 2017-12-16 DIAGNOSIS — E1143 Type 2 diabetes mellitus with diabetic autonomic (poly)neuropathy: Secondary | ICD-10-CM | POA: Diagnosis not present

## 2017-12-16 DIAGNOSIS — E785 Hyperlipidemia, unspecified: Secondary | ICD-10-CM | POA: Diagnosis not present

## 2017-12-16 DIAGNOSIS — G4730 Sleep apnea, unspecified: Secondary | ICD-10-CM | POA: Diagnosis not present

## 2017-12-16 DIAGNOSIS — G8929 Other chronic pain: Secondary | ICD-10-CM | POA: Diagnosis not present

## 2017-12-16 DIAGNOSIS — E039 Hypothyroidism, unspecified: Secondary | ICD-10-CM | POA: Diagnosis not present

## 2017-12-16 DIAGNOSIS — R69 Illness, unspecified: Secondary | ICD-10-CM | POA: Diagnosis not present

## 2017-12-16 DIAGNOSIS — E1140 Type 2 diabetes mellitus with diabetic neuropathy, unspecified: Secondary | ICD-10-CM | POA: Diagnosis not present

## 2017-12-16 DIAGNOSIS — J449 Chronic obstructive pulmonary disease, unspecified: Secondary | ICD-10-CM | POA: Diagnosis not present

## 2017-12-16 DIAGNOSIS — Z794 Long term (current) use of insulin: Secondary | ICD-10-CM | POA: Diagnosis not present

## 2017-12-20 DIAGNOSIS — E119 Type 2 diabetes mellitus without complications: Secondary | ICD-10-CM | POA: Diagnosis not present

## 2017-12-24 ENCOUNTER — Encounter: Payer: Self-pay | Admitting: Endocrinology

## 2017-12-24 ENCOUNTER — Ambulatory Visit (INDEPENDENT_AMBULATORY_CARE_PROVIDER_SITE_OTHER): Payer: Medicare HMO | Admitting: Endocrinology

## 2017-12-24 VITALS — BP 138/72 | HR 82 | Wt 288.8 lb

## 2017-12-24 DIAGNOSIS — E1165 Type 2 diabetes mellitus with hyperglycemia: Secondary | ICD-10-CM

## 2017-12-24 DIAGNOSIS — Z794 Long term (current) use of insulin: Secondary | ICD-10-CM

## 2017-12-24 DIAGNOSIS — IMO0002 Reserved for concepts with insufficient information to code with codable children: Secondary | ICD-10-CM

## 2017-12-24 LAB — POCT GLYCOSYLATED HEMOGLOBIN (HGB A1C): HEMOGLOBIN A1C: 11.1

## 2017-12-24 MED ORDER — BASAGLAR KWIKPEN 100 UNIT/ML ~~LOC~~ SOPN: 120.0000 [IU] | PEN_INJECTOR | SUBCUTANEOUS | 11 refills | Status: DC

## 2017-12-24 MED ORDER — INSULIN NPH (HUMAN) (ISOPHANE) 100 UNIT/ML ~~LOC~~ SUSP: 120.0000 [IU] | Freq: Two times a day (BID) | SUBCUTANEOUS | 11 refills | Status: DC

## 2017-12-24 NOTE — Progress Notes (Signed)
Subjective:    Patient ID: Mary Acosta, female    DOB: 1955/11/17, 62 y.o.   MRN: [id number]  HPI Pt returns for f/u of diabetes mellitus: DM type: 1 Dx'ed: 0174 Complications: polyneuropathy and retinopathy Therapy: insulin since 2011 GDM: never DKA: once, in 2015 Severe hypoglycemia: never Pancreatitis: never Other: she had gastric banding in 2008.  She then lost 148 lbs, but has since regained 90 of that; she takes human insulin, due to cost; she takes qd insulin, due to noncompliance with multiple daily injections   Interval history: no cbg record, but states cbg's are in the 200's.  She says she never misses the insulin, but she takes just 80 units qam.  She says she can afford basalgar on her insurance.  Past Medical History:  Diagnosis Date  . AKI (acute kidney injury) ([place]) 01/2017  . Anxiety    with panic attacks  . Arthritis    "back; feet; hands; shoulders" (08/26/2014)  . Asthma   . Cervical cancer ([place])   . Chronic lower back pain   . Chronic narcotic use   . Chronic pain syndrome    [facility]  . Cirrhosis ([place])   . Clostridium difficile infection 2017  . COPD (chronic obstructive pulmonary disease) ([place])   . Daily headache   . Depression   . Diabetic neuropathy ([place]) 06/04/2017  . DJD (degenerative joint disease)   . Fatty liver disease, nonalcoholic   . Fibromyalgia   . Frequency of urination   . HCAP (healthcare-associated pneumonia) 08/26/2014  . History of TIA (transient ischemic attack) 11-01-2010   NO RESIDUAL  . Hyperlipidemia   . Hypertension   . Hypothyroidism   . IDDM (insulin dependent diabetes mellitus) ([place])   . Insomnia   . Lumbar stenosis   . Memory difficulty 07/25/2016  . Nocturia   . OSA (obstructive sleep apnea)    NO CPAP SINCE WT LOSS  . Osteoarthritis    with severe disease in knee  . Pneumonia "several times"  . Polymyalgia rheumatica ([place])   . Scoliosis   . Seasonal allergies   . Thyroid cancer  ([place])   . Urgency of urination   . Vaginal pain S/P SLING  FEB 2012    Past Surgical History:  Procedure Laterality Date  . APPENDECTOMY  1982  . BREAST EXCISIONAL BIOPSY Left 02/28/2005   Atypical Ductal Hyperplasia  . CARDIAC CATHETERIZATION  09-04-2004   NORMAL CORONARY ANATOMY/ NORMAL LVF/ EF 60%  . CARDIOVASCULAR STRESS TEST  12-27-2010  DR [place]   ABNORMAL NUCLEAR STUDY W/ /MILD INFERIOR ISCHEMIA/ EF 69%/  CT HEART ANGIOGRAM ;  NO ACUTE FINDINGS  . CRYOABLATION  05/16/2003   w/LEEP FOR ABNORMAL PAP SMEAR  . CYSTOSCOPY  05/18/2012   Procedure: CYSTOSCOPY;  Surgeon: Reece Packer, MD;  Location: [facility];  Service: Urology;  Laterality: N/A;  examination under anethesia  . ESOPHAGOGASTRODUODENOSCOPY (EGD) WITH PROPOFOL N/A 09/03/2016   Procedure: ESOPHAGOGASTRODUODENOSCOPY (EGD) WITH PROPOFOL;  Surgeon: Doran Stabler, MD;  Location: [facility];  Service: Gastroenterology;  Laterality: N/A;  . HYSTEROSCOPY W/D&C  08-19-2007   PMB  . KNEE ARTHROSCOPY W/ DEBRIDEMENT Left 03/29/2006   INTERNAL DERANGEMENT/ SEVERE DJD/ MENISCUS TEARS  . LAPAROSCOPIC CHOLECYSTECTOMY  06-10-2002  . LAPAROSCOPIC GASTRIC BANDING  03/01/2006   TRUNCAL VAGOTOMY/ PLACEMENT OF VG BAND  . REVISION TOTAL KNEE ARTHROPLASTY Left 08-29-2008; 05/2009  . TONSILLECTOMY  1969  . TOTAL KNEE ARTHROPLASTY  Left 01-23-2007   SEVERE DJD  . TOTAL THYROIDECTOMY  11-22-2005   BILATERAL THYROID NODULES-- PAPILLARY CARCINOMA (0.5CM)/ ADENOMATOID NODULES  . TRANSTHORACIC ECHOCARDIOGRAM  12-27-2010   LVSF NORMAL / EF 16-60%/ GRADE I DIASTOLIC DYSFUNCTION/ MILD MITRAL REGURG. / MILDLY DILATED LEFT ATRIUM/ MILDY INCREASED SYSTOLIC PRESSURE OF PULMONARY ARTERIES  . TRANSVAGINAL SUBURETERAL TAPE/ SLING  09-28-2010   MIXED URINARY INCONTINENCE  . TUBAL LIGATION  1983    Social History   Socioeconomic History  . Marital status: Married    Spouse name: Not on file  . Number of children: 2  . Years of  education: 50  . Highest education level: Not on file  Occupational History  . Occupation: disabled    Fish farm manager: UNEMPLOYED  Social Needs  . Financial resource strain: Not on file  . Food insecurity:    Worry: Not on file    Inability: Not on file  . Transportation needs:    Medical: Not on file    Non-medical: Not on file  Tobacco Use  . Smoking status: Former Smoker    Packs/day: 2.50    Years: 39.00    Pack years: 97.50    Types: Cigarettes    Last attempt to quit: 10/22/2010    Years since quitting: 7.1  . Smokeless tobacco: Never Used  Substance and Sexual Activity  . Alcohol use: No    Alcohol/week: 0.0 oz  . Drug use: No  . Sexual activity: Not Currently  Lifestyle  . Physical activity:    Days per week: Not on file    Minutes per session: Not on file  . Stress: Not on file  Relationships  . Social connections:    Talks on phone: Not on file    Gets together: Not on file    Attends religious service: Not on file    Active member of club or organization: Not on file    Attends meetings of clubs or organizations: Not on file    Relationship status: Not on file  . Intimate partner violence:    Fear of current or ex partner: Not on file    Emotionally abused: Not on file    Physically abused: Not on file    Forced sexual activity: Not on file  Other Topics Concern  . Not on file  Social History Narrative   Lives at home w/ her husband and grandson   Right-handed   Caffeine: 1 cup of coffee per week + [employer]    Current Outpatient Medications on File Prior to Visit  Medication Sig Dispense Refill  . albuterol (PROVENTIL HFA;VENTOLIN HFA) 108 (90 Base) MCG/ACT inhaler Inhale 2 puffs into the lungs every 6 (six) hours as needed for wheezing or shortness of breath. 1 Inhaler 2  . amitriptyline (ELAVIL) 50 MG tablet Take 2 tablets (100 mg total) by mouth at bedtime.    Marland Kitchen amLODipine (NORVASC) 5 MG tablet Take 1 tablet (5 mg total) by mouth 2 (two) times daily. 180  tablet 1  . amoxicillin-clavulanate (AUGMENTIN) 875-125 MG tablet Take 1 tablet by mouth 2 (two) times daily. (Patient not taking: Reported on 12/24/2017) 14 tablet 0  . atorvastatin (LIPITOR) 40 MG tablet TAKE 1 TABLET BY MOUTH EVERY DAY 90 tablet 3  . busPIRone (BUSPAR) 7.5 MG tablet Take 1 tablet (7.5 mg total) by mouth 3 (three) times daily. (Patient taking differently: Take 7.5 mg by mouth 3 (three) times daily as needed (anxiety). ) 270 tablet 0  . donepezil (ARICEPT)  10 MG tablet Take 1 tablet (10 mg total) at bedtime by mouth. 90 tablet 3  . hydrALAZINE (APRESOLINE) 25 MG tablet Take 1 tablet (25 mg total) by mouth 2 (two) times daily. 180 tablet 3  . HYDROmorphone (DILAUDID) 4 MG tablet Take 1 tablet (4 mg total) by mouth every 8 (eight) hours as needed for moderate pain. (Patient taking differently: Take 4 mg by mouth 3 (three) times daily. )    . Insulin Syringe-Needle U-100 (INSULIN SYRINGE 1CC/30GX5/16") 30G X 5/16" 1 ML MISC Use as directed for insulin injections 5 times daily 150 each 1  . Ipratropium-Albuterol (COMBIVENT RESPIMAT) 20-100 MCG/ACT AERS respimat Inhale 1 puff into the lungs every 6 (six) hours. (Patient taking differently: Inhale 1 puff into the lungs every 6 (six) hours as needed for wheezing or shortness of breath. ) 1 Inhaler 3  . ipratropium-albuterol (DUONEB) 0.5-2.5 (3) MG/3ML SOLN Take 3 mLs by nebulization every 6 (six) hours as needed (Wheezing or dyspnea.).    Marland Kitchen levothyroxine (SYNTHROID, LEVOTHROID) 175 MCG tablet Take 1 tablet by mouth six days a week before breakfast (Patient taking differently: Take 175 mcg by mouth daily. ) 90 tablet 0  . Lidocaine HCl (ASPERCREME LIDOCAINE) 4 % LIQD Apply 1 application topically daily as needed (pain).    . meloxicam (MOBIC) 7.5 MG tablet Take 15 mg by mouth daily.   5  . metoprolol tartrate (LOPRESSOR) 25 MG tablet Take 1 tablet (25 mg total) by mouth 2 (two) times daily. 180 tablet 3  . morphine (MS CONTIN) 30 MG 12 hr  tablet Take 30 mg by mouth 2 (two) times daily.    . ondansetron (ZOFRAN-ODT) 4 MG disintegrating tablet Take 1 tablet (4 mg total) by mouth every 6 (six) hours as needed for nausea or vomiting. 20 tablet 1  . potassium chloride SA (KLOR-CON M20) 20 MEQ tablet TAKE 2 TABLETS EVERY DAY AS NEEDED FOR CRAMPING 180 tablet 1  . tiZANidine (ZANAFLEX) 2 MG tablet Take 2 mg by mouth 3 (three) times daily as needed for muscle spasms.   2   No current facility-administered medications on file prior to visit.     Allergies  Allergen Reactions  . Gabapentin Swelling    Swelling in legs Swelling in legs Swelling in legs  . Losartan Other (See Comments)    Myalgias and muscle cramping Other reaction(s): Other (See Comments) Myalgias and muscle cramping Myalgias and muscle cramping Other reaction(s): Other (See Comments) Myalgias and muscle cramping  . Oxycodone Itching  . Sulfa Antibiotics Nausea Only and Rash  . Sulfonamide Derivatives Nausea Only    Family History  Problem Relation Age of Onset  . Diabetes Mother   . Heart disease Mother   . Dementia Mother   . Heart disease Father   . High blood pressure Father   . Colon cancer Maternal Uncle        x 2  . Breast cancer Other        great aunts x 5    BP 138/72   Pulse 82   Wt 288 lb 12.8 oz (131 kg)   SpO2 95%   BMI 42.65 kg/m    Review of Systems He has regained weight.     Objective:   Physical Exam VITAL SIGNS:  See vs page GENERAL: no distress.  Morbid obesity Pulses: dorsalis pedis intact bilat.   MSK: no deformity of the feet, except for bilat hammer toes  CV: 1+ bilat leg edema, bilat  vv's, and hyperpigmentation of the legs.   Skin:  no ulcer on the feet.  normal color and temp on the feet. Neuro: sensation is intact to touch on the feet.  Ext: There is bilateral onychomycosis of the toenails.    Lab Results  Component Value Date   HGBA1C 11.1 12/24/2017      Assessment & Plan:  Type 1 DM, with DR:  poor glycemic control Noncompliance with cbg recording, and f/u OV's Obesity: worse  Patient Instructions  check your blood sugar twice a day.  vary the time of day when you check, between before the 3 meals, and at bedtime.  also check if you have symptoms of your blood sugar being too high or too low.  please keep a record of the readings and bring it to your next appointment here (or you can bring the meter itself).  You can write it on any piece of paper.  please call us sooner if your blood sugar goes below 70, or if you have a lot of readings over 200. increase the NPH to 120 units each morning.  Please come back for a follow-up appointment in 2 months.  You should consider redoing the weight loss surgery, or having more extensive weight-loss surgery.  I'll do the diabetes shoes form based on the hammer toes.      Bariatric Surgery You have so much to gain by losing weight.  You may have already tried every diet and exercise plan imaginable.  And, you may have sought advice from your family physician, too.   Sometimes, in spite of such diligent efforts, you may not be able to achieve long-term results by yourself.  In cases of severe obesity, bariatric or weight loss surgery is a proven method of achieving long-term weight control.  Our Services Our bariatric surgery programs offer our patients new hope and long-term weight-loss solution.  Since introducing our services in 2003, we have conducted more than 2,400 successful procedures.  Our program is designated as a Programmer, multimedia by the Metabolic and Bariatric Surgery Accreditation and Quality Improvement Program (MBSAQIP), a IT trainer that sets rigorous patient safety and outcome standards.  Our program is also designated as a Ecologist by [employer].   Our exceptional weight-loss surgery team specializes in diagnosis, treatment, follow-up care, and ongoing support for our patients with  severe weight loss challenges.  We currently offer laparoscopic sleeve gastrectomy, gastric bypass, and adjustable gastric band (LAP-BAND).    Attend our [place] Choosing to undergo a bariatric procedure is a big decision, and one that should not be taken lightly.  You now have two options in how you learn about weight-loss surgery - in person or online.  Our objective is to ensure you have all of the information that you need to evaluate the advantages and obligations of this life changing procedure.  Please note that you are not alone in this process, and our experienced team is ready to assist and answer all of your questions.  There are several ways to register for a seminar (either on-line or in person): 1)  Call [phone] 2) Go on-line to [facility] and register for either type of seminar.  [email or web address]

## 2017-12-24 NOTE — Patient Instructions (Addendum)
check your blood sugar twice a day.  vary the time of day when you check, between before the 3 meals, and at bedtime.  also check if you have symptoms of your blood sugar being too high or too low.  please keep a record of the readings and bring it to your next appointment here (or you can bring the meter itself).  You can write it on any piece of paper.  please call us sooner if your blood sugar goes below 70, or if you have a lot of readings over 200. increase the NPH to 120 units each morning.  Please come back for a follow-up appointment in 2 months.  You should consider redoing the weight loss surgery, or having more extensive weight-loss surgery.  I'll do the diabetes shoes form based on the hammer toes.      Bariatric Surgery You have so much to gain by losing weight.  You may have already tried every diet and exercise plan imaginable.  And, you may have sought advice from your family physician, too.   Sometimes, in spite of such diligent efforts, you may not be able to achieve long-term results by yourself.  In cases of severe obesity, bariatric or weight loss surgery is a proven method of achieving long-term weight control.  Our Services Our bariatric surgery programs offer our patients new hope and long-term weight-loss solution.  Since introducing our services in 2003, we have conducted more than 2,400 successful procedures.  Our program is designated as a Programmer, multimedia by the Metabolic and Bariatric Surgery Accreditation and Quality Improvement Program (MBSAQIP), a IT trainer that sets rigorous patient safety and outcome standards.  Our program is also designated as a Ecologist by [employer].   Our exceptional weight-loss surgery team specializes in diagnosis, treatment, follow-up care, and ongoing support for our patients with severe weight loss challenges.  We currently offer laparoscopic sleeve gastrectomy, gastric bypass, and adjustable  gastric band (LAP-BAND).    Attend our [place] Choosing to undergo a bariatric procedure is a big decision, and one that should not be taken lightly.  You now have two options in how you learn about weight-loss surgery - in person or online.  Our objective is to ensure you have all of the information that you need to evaluate the advantages and obligations of this life changing procedure.  Please note that you are not alone in this process, and our experienced team is ready to assist and answer all of your questions.  There are several ways to register for a seminar (either on-line or in person): 1)  Call [phone] 2) Go on-line to [facility] and register for either type of seminar.  [email or web address]

## 2017-12-25 ENCOUNTER — Telehealth: Payer: Self-pay | Admitting: Endocrinology

## 2017-12-25 DIAGNOSIS — E039 Hypothyroidism, unspecified: Secondary | ICD-10-CM

## 2017-12-25 NOTE — Telephone Encounter (Signed)
I called & notified patient that labs were ordered to have drawn at [facility]. I asked that she call to get them to schedule her a lab appointment.

## 2017-12-25 NOTE — Telephone Encounter (Signed)
Ok, I ordered.  Please have drawn at [facility].

## 2017-12-25 NOTE — Telephone Encounter (Signed)
Patient was in the office yesterday and forgot to ask the dr about some symptoms she is having.   She states her hair is falling out, her skin is extremely dry and she is having a tough time sleeping.   Please advise

## 2017-12-26 ENCOUNTER — Other Ambulatory Visit (INDEPENDENT_AMBULATORY_CARE_PROVIDER_SITE_OTHER): Payer: Medicare HMO

## 2017-12-26 DIAGNOSIS — E039 Hypothyroidism, unspecified: Secondary | ICD-10-CM | POA: Diagnosis not present

## 2017-12-26 LAB — TSH: TSH: 0.9 u[IU]/mL (ref 0.35–4.50)

## 2017-12-27 LAB — T4, FREE: FREE T4: 0.93 ng/dL (ref 0.60–1.60)

## 2017-12-27 LAB — T3, FREE: T3, Free: 3 pg/mL (ref 2.3–4.2)

## 2017-12-29 ENCOUNTER — Ambulatory Visit (INDEPENDENT_AMBULATORY_CARE_PROVIDER_SITE_OTHER): Payer: Medicare HMO

## 2017-12-29 DIAGNOSIS — I472 Ventricular tachycardia: Secondary | ICD-10-CM | POA: Diagnosis not present

## 2017-12-30 ENCOUNTER — Ambulatory Visit: Payer: Medicare HMO | Admitting: Cardiology

## 2018-01-06 ENCOUNTER — Other Ambulatory Visit: Payer: Self-pay

## 2018-01-06 ENCOUNTER — Telehealth: Payer: Self-pay

## 2018-01-06 DIAGNOSIS — J449 Chronic obstructive pulmonary disease, unspecified: Secondary | ICD-10-CM | POA: Diagnosis not present

## 2018-01-06 MED ORDER — BASAGLAR KWIKPEN 100 UNIT/ML ~~LOC~~ SOPN: 120.0000 [IU] | PEN_INJECTOR | SUBCUTANEOUS | 11 refills | Status: DC

## 2018-01-06 NOTE — Telephone Encounter (Signed)
Patient called & was confused because when she picked up prescription for basaglar she was only given 5 pens plus Novolin N. Prescription was sent in for 15 pens a month & patient was running low. I called the pharmacy & they stated it was filled as 15 mL instead of pens, so Tammy at [facility] was correcting this. Patient will not be charged since she already paid her co-pay. I also had her d/c the Novolin N prescription. I called patient to let her know these changes. I also resent prescription as a 90 day supply to see if it would lower the price of basaglar.

## 2018-01-09 ENCOUNTER — Encounter: Payer: Self-pay | Admitting: Pulmonary Disease

## 2018-01-09 ENCOUNTER — Ambulatory Visit (INDEPENDENT_AMBULATORY_CARE_PROVIDER_SITE_OTHER): Payer: Medicare HMO | Admitting: Pulmonary Disease

## 2018-01-09 ENCOUNTER — Telehealth: Payer: Self-pay | Admitting: Gastroenterology

## 2018-01-09 DIAGNOSIS — G4733 Obstructive sleep apnea (adult) (pediatric): Secondary | ICD-10-CM

## 2018-01-09 DIAGNOSIS — J9611 Chronic respiratory failure with hypoxia: Secondary | ICD-10-CM | POA: Diagnosis not present

## 2018-01-09 DIAGNOSIS — J449 Chronic obstructive pulmonary disease, unspecified: Secondary | ICD-10-CM | POA: Diagnosis not present

## 2018-01-09 NOTE — Progress Notes (Signed)
Subjective:    Patient ID: Mary Acosta, female    DOB: 01-24-1956, 63 y.o.   MRN: [id number]  HPI  62 year old obese remote smoker presents for evaluation of sleep disordered breathing. Her problem list carries various diagnosis none of which seem to be born out by her studies  COPD-pulmonary function testing does not show any evidence of airway obstruction, she smoked heavily before she quit in 2012 Pulmonary hypertension-last echo shows RVSP of 22 shows normal She does seem to have diabetic neuropathy for which she takes amitriptyline and chronic pain syndrome due to degenerative disc disease and fibromyalgia for which she takes long-acting morphine and Dilaudid 3 times daily  She underwent a sleep study 10/2014 when she weighed 291 pounds, this did not show significant apneas or hypopneas but did show desaturation to 82%.  She has been placed on nocturnal oxygen number 3 L since then. She underwent another study 09/2017 which showed moderate OSA with AHI of 27/hour corrected by CPAP of 13 cm with minimal desaturation of 87% in the final level.  Epworth sleepiness score is 24 and she reports sleepiness in various situations.  Her husband events confirms this history and has actually witnessed apneas.  She has non-refreshing sleep, she occasionally wakes up with severe palpitations and is currently wearing a heart monitor. Bedtime is between 9:11 PM, sleep latency is minimal for the most part, sleeps on 2 pillows, reports 4-5 nocturnal awakenings, and is out of bed around 9:30 AM.  Sometimes wakes up earlier when her grandson Josh home they have adopted goes to school then gets back into bed and will sometimes will stay in bed until 2 PM.  She wakes up feeling tired with dryness of mouth and occasional headaches.  She has gained 25 pounds over the last 2 years.  She goes to sleep with the oxygen on the very often this falls away during the night    Significant tests/ events  reviewed  NPSG 09/2017 AHI 27/h >> CPAP 13 cm  NPSG 10/2014- 291 lbs _AHI 0.4/hour with lowest desaturation of 82% >> started on nocturnal oxygen   PFTs 10/2014-ratio 81, FEV1 82%, FVC 79%, TLC 87% and DLCO 57%    Past Medical History:  Diagnosis Date  . AKI (acute kidney injury) ([place]) 01/2017  . Anxiety    with panic attacks  . Arthritis    "back; feet; hands; shoulders" (08/26/2014)  . Asthma   . Cervical cancer ([place])   . Chronic lower back pain   . Chronic narcotic use   . Chronic pain syndrome    [facility]  . Cirrhosis ([place])   . Clostridium difficile infection 2017  . COPD (chronic obstructive pulmonary disease) ([place])   . Daily headache   . Depression   . Diabetic neuropathy ([place]) 06/04/2017  . DJD (degenerative joint disease)   . Fatty liver disease, nonalcoholic   . Fibromyalgia   . Frequency of urination   . HCAP (healthcare-associated pneumonia) 08/26/2014  . History of TIA (transient ischemic attack) 11-01-2010   NO RESIDUAL  . Hyperlipidemia   . Hypertension   . Hypothyroidism   . IDDM (insulin dependent diabetes mellitus) ([place])   . Insomnia   . Lumbar stenosis   . Memory difficulty 07/25/2016  . Nocturia   . OSA (obstructive sleep apnea)    NO CPAP SINCE WT LOSS  . Osteoarthritis    with severe disease in knee  . Pneumonia "several times"  .  Polymyalgia rheumatica ([place])   . Scoliosis   . Seasonal allergies   . Thyroid cancer ([place])   . Urgency of urination   . Vaginal pain S/P SLING  FEB 2012    Past Surgical History:  Procedure Laterality Date  . APPENDECTOMY  1982  . BREAST EXCISIONAL BIOPSY Left 02/28/2005   Atypical Ductal Hyperplasia  . CARDIAC CATHETERIZATION  09-04-2004   NORMAL CORONARY ANATOMY/ NORMAL LVF/ EF 60%  . CARDIOVASCULAR STRESS TEST  12-27-2010  DR Martin[place]   ABNORMAL NUCLEAR STUDY W/ /MILD INFERIOR ISCHEMIA/ EF 69%/  CT HEART ANGIOGRAM ;  NO ACUTE FINDINGS  . CRYOABLATION  05/16/2003   w/LEEP FOR ABNORMAL PAP SMEAR   . CYSTOSCOPY  05/18/2012   Procedure: CYSTOSCOPY;  Surgeon: Reece Packer, MD;  Location[facility];  Service: Urology;  Laterality: N/A;  examination under anethesia  . ESOPHAGOGASTRODUODENOSCOPY (EGD) WITH PROPOFOL N/A 09/03/2016   Procedure: ESOPHAGOGASTRODUODENOSCOPY (EGD) WITH PROPOFOL;  Surgeon: Doran Stabler, MD;  Location: [facility];  Service: Gastroenterology;  Laterality: N/A;  . HYSTEROSCOPY W/D&C  08-19-2007   PMB  . KNEE ARTHROSCOPY W/ DEBRIDEMENT Left 03/29/2006   INTERNAL DERANGEMENT/ SEVERE DJD/ MENISCUS TEARS  . LAPAROSCOPIC CHOLECYSTECTOMY  06-10-2002  . LAPAROSCOPIC GASTRIC BANDING  03/01/2006   TRUNCAL VAGOTOMY/ PLACEMENT OF VG BAND  . REVISION TOTAL KNEE ARTHROPLASTY Left 08-29-2008; 05/2009  . TONSILLECTOMY  1969  . TOTAL KNEE ARTHROPLASTY Left 01-23-2007   SEVERE DJD  . TOTAL THYROIDECTOMY  11-22-2005   BILATERAL THYROID NODULES-- PAPILLARY CARCINOMA (0.5CM)/ ADENOMATOID NODULES  . TRANSTHORACIC ECHOCARDIOGRAM  12-27-2010   LVSF NORMAL / EF 60-63%/ GRADE I DIASTOLIC DYSFUNCTION/ MILD MITRAL REGURG. / MILDLY DILATED LEFT ATRIUM/ MILDY INCREASED SYSTOLIC PRESSURE OF PULMONARY ARTERIES  . TRANSVAGINAL SUBURETERAL TAPE/ SLING  09-28-2010   MIXED URINARY INCONTINENCE  . TUBAL LIGATION  1983     Allergies  Allergen Reactions  . Gabapentin Swelling    Swelling in legs Swelling in legs Swelling in legs  . Losartan Other (See Comments)    Myalgias and muscle cramping Other reaction(s): Other (See Comments) Myalgias and muscle cramping Myalgias and muscle cramping Other reaction(s): Other (See Comments) Myalgias and muscle cramping  . Oxycodone Itching  . Sulfa Antibiotics Nausea Only and Rash  . Sulfonamide Derivatives Nausea Only     Social History   Socioeconomic History  . Marital status: Married    Spouse name: Not on file  . Number of children: 2  . Years of education: 79  . Highest education level: Not on file    Occupational History  . Occupation: disabled    Fish farm manager: UNEMPLOYED  Social Needs  . Financial resource strain: Not on file  . Food insecurity:    Worry: Not on file    Inability: Not on file  . Transportation needs:    Medical: Not on file    Non-medical: Not on file  Tobacco Use  . Smoking status: Former Smoker    Packs/day: 2.50    Years: 39.00    Pack years: 97.50    Types: Cigarettes    Last attempt to quit: 10/22/2010    Years since quitting: 7.2  . Smokeless tobacco: Never Used  Substance and Sexual Activity  . Alcohol use: No    Alcohol/week: 0.0 oz  . Drug use: No  . Sexual activity: Not Currently  Lifestyle  . Physical activity:    Days per week: Not on file    Minutes per session:  Not on file  . Stress: Not on file  Relationships  . Social connections:    Talks on phone: Not on file    Gets together: Not on file    Attends religious service: Not on file    Active member of club or organization: Not on file    Attends meetings of clubs or organizations: Not on file    Relationship status: Not on file  . Intimate partner violence:    Fear of current or ex partner: Not on file    Emotionally abused: Not on file    Physically abused: Not on file    Forced sexual activity: Not on file  Other Topics Concern  . Not on file  Social History Narrative   Lives at home w/ her husband and grandson   Right-handed   Caffeine: 1 cup of coffee per week + [employer]epsi      Family History  Problem Relation Age of Onset  . Diabetes Mother   . Heart disease Mother   . Dementia Mother   . Heart disease Father   . High blood pressure Father   . Colon cancer Maternal Uncle        x 2  . Breast cancer Other        great aunts x 5       Review of Systems  Constitutional: Negative for fever and unexpected weight change.  HENT: Positive for sneezing. Negative for congestion, dental problem, ear pain, nosebleeds, postnasal drip, rhinorrhea, sinus pressure, sore throat  and trouble swallowing.   Eyes: Negative for redness and itching.  Respiratory: Negative for cough, chest tightness, shortness of breath and wheezing.   Cardiovascular: Negative for palpitations and leg swelling.  Gastrointestinal: Negative for nausea and vomiting.  Genitourinary: Negative for dysuria.  Musculoskeletal: Negative for joint swelling.  Skin: Negative for rash.  Allergic/Immunologic: Negative.  Negative for environmental allergies, food allergies and immunocompromised state.  Neurological: Negative for headaches.  Hematological: Does not bruise/bleed easily.  Psychiatric/Behavioral: Negative for dysphoric mood. The patient is nervous/anxious.        Objective:   Physical Exam   Gen. Pleasant, obese, in no distress, normal affect ENT - no thrush, no post nasal drip, class 2-3 airway Neck: No JVD, no thyromegaly, no carotid bruits Lungs: no use of accessory muscles, no dullness to percussion, decreased without rales or rhonchi  Cardiovascular: Rhythm regular, heart sounds  normal, no murmurs or gallops, 1+ peripheral edema Abdomen: soft and non-tender, no hepatosplenomegaly, BS normal. Musculoskeletal: No deformities, no cyanosis or clubbing Neuro:  alert, non focal, no tremors        Assessment & Plan:

## 2018-01-09 NOTE — Telephone Encounter (Signed)
No samples in stock. Pt was notified and aware per the front staff.

## 2018-01-09 NOTE — Assessment & Plan Note (Signed)
Prescription for auto CPAP 8-13 cm with F 30 nasal pillows will be sent to DME  The pathophysiology of obstructive sleep apnea , it's cardiovascular consequences & modes of treatment including CPAP were discused with the patient in detail & they evidenced understanding. Weight loss encouraged, compliance with goal of at least 4-6 hrs every night is the expectation. Advised against medications with sedative side effects Cautioned against driving when sleepy - understanding that sleepiness will vary on a day to day basis

## 2018-01-09 NOTE — Patient Instructions (Signed)
You have moderate degree of obstructive sleep apnea, you stop breathing 27 times an hour during the sleep study. Prescription for auto CPAP 8-13 cm with F 30 nasal pillows will be sent to DME

## 2018-01-09 NOTE — Assessment & Plan Note (Signed)
She has been maintained on 3 L nocturnal oxygen since her sleep study in 2016 but current study shows that CPAP seems to correct hypoxia for the most part except for minimum desaturation of 87%. We will repeat nocturnal oximetry on CPAP/room air eventually to reassess once she is noted to be compliant Clearly obesity and pain medicines seem to play a role

## 2018-01-14 DIAGNOSIS — M533 Sacrococcygeal disorders, not elsewhere classified: Secondary | ICD-10-CM | POA: Diagnosis not present

## 2018-01-14 DIAGNOSIS — Z0289 Encounter for other administrative examinations: Secondary | ICD-10-CM | POA: Diagnosis not present

## 2018-01-14 DIAGNOSIS — G894 Chronic pain syndrome: Secondary | ICD-10-CM | POA: Diagnosis not present

## 2018-01-14 DIAGNOSIS — Z5181 Encounter for therapeutic drug level monitoring: Secondary | ICD-10-CM | POA: Diagnosis not present

## 2018-01-14 DIAGNOSIS — M5137 Other intervertebral disc degeneration, lumbosacral region: Secondary | ICD-10-CM | POA: Diagnosis not present

## 2018-01-18 ENCOUNTER — Other Ambulatory Visit: Payer: Self-pay | Admitting: Internal Medicine

## 2018-01-22 DIAGNOSIS — J449 Chronic obstructive pulmonary disease, unspecified: Secondary | ICD-10-CM | POA: Diagnosis not present

## 2018-01-22 DIAGNOSIS — G4733 Obstructive sleep apnea (adult) (pediatric): Secondary | ICD-10-CM | POA: Diagnosis not present

## 2018-01-22 DIAGNOSIS — E662 Morbid (severe) obesity with alveolar hypoventilation: Secondary | ICD-10-CM | POA: Diagnosis not present

## 2018-01-22 DIAGNOSIS — J9611 Chronic respiratory failure with hypoxia: Secondary | ICD-10-CM | POA: Diagnosis not present

## 2018-02-08 ENCOUNTER — Encounter: Payer: Self-pay | Admitting: Pulmonary Disease

## 2018-02-09 DIAGNOSIS — J449 Chronic obstructive pulmonary disease, unspecified: Secondary | ICD-10-CM | POA: Diagnosis not present

## 2018-02-09 NOTE — Telephone Encounter (Signed)
With her medical problems etc., sleep medications in general are not advised. Suggest over-the-counter melatonin 5 to 10 mg 1 hour before bedtime

## 2018-02-09 NOTE — Telephone Encounter (Signed)
Dr. Elsworth Soho, please advise on pt email, thanks:  I really love the way I feel when I wake up after I use the cpap machine. But I'm having problems getting to sleep and staying asleep. Can you call me in something to help me sleep. I used halcion many years ago and had no problems with it. [facility] [address]. Thank you for helping me feel better

## 2018-02-14 ENCOUNTER — Encounter: Payer: Self-pay | Admitting: Pulmonary Disease

## 2018-02-15 ENCOUNTER — Other Ambulatory Visit: Payer: Self-pay | Admitting: Internal Medicine

## 2018-02-19 ENCOUNTER — Other Ambulatory Visit: Payer: Self-pay | Admitting: Internal Medicine

## 2018-02-20 ENCOUNTER — Telehealth: Payer: Self-pay | Admitting: Gastroenterology

## 2018-02-20 NOTE — Telephone Encounter (Signed)
Routed to DOD, Dr. Loletha Carrow patient, I called patient, she is having increased nausea last day or two. She is currently in [place], [place] and has tried taking her Zofran, two 4 mg tablets which has not helped. She states that her blood sugars are controlled. She is requesting we [facility] in something different [phone]. I let her know that Dr. Loletha Carrow is not in the office but would send this to the doctor on call.

## 2018-02-20 NOTE — Telephone Encounter (Signed)
Discussed with the patient. She is able to retain fluids. Feels she is hydrating well. She thinks her Aricept may be causing some of her nausea. She is going to discuss this with her prescribing physician. Acknowledges if she cannot retain or tolerate fluids, she should present to the ED.

## 2018-02-20 NOTE — Telephone Encounter (Signed)
Patient will need to go to nearest ER for persistent nausea and vomiting. She has history wide complex tachycardia and do not recommend Reglan or high dose Zofran.

## 2018-02-22 DIAGNOSIS — E662 Morbid (severe) obesity with alveolar hypoventilation: Secondary | ICD-10-CM | POA: Diagnosis not present

## 2018-02-22 DIAGNOSIS — G4733 Obstructive sleep apnea (adult) (pediatric): Secondary | ICD-10-CM | POA: Diagnosis not present

## 2018-02-22 DIAGNOSIS — J9611 Chronic respiratory failure with hypoxia: Secondary | ICD-10-CM | POA: Diagnosis not present

## 2018-02-22 DIAGNOSIS — J449 Chronic obstructive pulmonary disease, unspecified: Secondary | ICD-10-CM | POA: Diagnosis not present

## 2018-02-24 ENCOUNTER — Ambulatory Visit (INDEPENDENT_AMBULATORY_CARE_PROVIDER_SITE_OTHER): Payer: Medicare HMO | Admitting: Endocrinology

## 2018-02-24 ENCOUNTER — Encounter: Payer: Self-pay | Admitting: Endocrinology

## 2018-02-24 VITALS — BP 192/82 | HR 93 | Wt 267.4 lb

## 2018-02-24 DIAGNOSIS — IMO0002 Reserved for concepts with insufficient information to code with codable children: Secondary | ICD-10-CM

## 2018-02-24 DIAGNOSIS — Z794 Long term (current) use of insulin: Secondary | ICD-10-CM

## 2018-02-24 DIAGNOSIS — E1165 Type 2 diabetes mellitus with hyperglycemia: Secondary | ICD-10-CM | POA: Diagnosis not present

## 2018-02-24 LAB — POCT GLYCOSYLATED HEMOGLOBIN (HGB A1C)
Hemoglobin A1C: 9.9 % — AB (ref 4.0–5.6)
Hemoglobin A1C: 9.9 % — AB (ref 4.0–5.6)

## 2018-02-24 MED ORDER — INSULIN NPH (HUMAN) (ISOPHANE) 100 UNIT/ML ~~LOC~~ SUSP
150.0000 [IU] | SUBCUTANEOUS | 11 refills | Status: DC
Start: 2018-02-24 — End: 2018-03-10

## 2018-02-24 NOTE — Progress Notes (Signed)
Subjective:    Patient ID: Mary Acosta, female    DOB: Mar 16, 1956, 62 y.o.   MRN: [id number]  HPI Pt returns for f/u of diabetes mellitus: DM type: 1 Dx'ed: 5809 Complications: polyneuropathy, gastroparesis, and retinopathy.  Therapy: insulin since 2011 GDM: never DKA: once, in 2015 Severe hypoglycemia: never Pancreatitis: never Other: she had gastric banding in 2008.  She then lost 148 lbs, but has since regained 70 of that; she takes human insulin, due to cost; she takes qd insulin, due to missing multiple daily injections   Interval history: no cbg record, but states cbg's are in the 200's.  She says she never misses the insulin.  pt states she feels well in general.   Past Medical History:  Diagnosis Date  . AKI (acute kidney injury) ([place]) 01/2017  . Anxiety    with panic attacks  . Arthritis    "back; feet; hands; shoulders" (08/26/2014)  . Asthma   . Cervical cancer ([place])   . Chronic lower back pain   . Chronic narcotic use   . Chronic pain syndrome    [facility]  . Cirrhosis ([place])   . Clostridium difficile infection 2017  . COPD (chronic obstructive pulmonary disease) ([place])   . Daily headache   . Depression   . Diabetic neuropathy ([name]) 06/04/2017  . DJD (degenerative joint disease)   . Fatty liver disease, nonalcoholic   . Fibromyalgia   . Frequency of urination   . HCAP (healthcare-associated pneumonia) 08/26/2014  . History of TIA (transient ischemic attack) 11-01-2010   NO RESIDUAL  . Hyperlipidemia   . Hypertension   . Hypothyroidism   . IDDM (insulin dependent diabetes mellitus) ([place])   . Insomnia   . Lumbar stenosis   . Memory difficulty 07/25/2016  . Nocturia   . OSA (obstructive sleep apnea)    NO CPAP SINCE WT LOSS  . Osteoarthritis    with severe disease in knee  . Pneumonia "several times"  . Polymyalgia rheumatica ([place])   . Scoliosis   . Seasonal allergies   . Thyroid cancer ([place])   . Urgency of urination   .  Vaginal pain S/P SLING  FEB 2012    Past Surgical History:  Procedure Laterality Date  . APPENDECTOMY  1982  . BREAST EXCISIONAL BIOPSY Left 02/28/2005   Atypical Ductal Hyperplasia  . CARDIAC CATHETERIZATION  09-04-2004   NORMAL CORONARY ANATOMY/ NORMAL LVF/ EF 60%  . CARDIOVASCULAR STRESS TEST  12-27-2010  DR [place]   ABNORMAL NUCLEAR STUDY W/ /MILD INFERIOR ISCHEMIA/ EF 69%/  CT HEART ANGIOGRAM ;  NO ACUTE FINDINGS  . CRYOABLATION  05/16/2003   w/LEEP FOR ABNORMAL PAP SMEAR  . CYSTOSCOPY  05/18/2012   Procedure: CYSTOSCOPY;  Surgeon: Reece Packer, MD;  Location: [facility];  Service: Urology;  Laterality: N/A;  examination under anethesia  . ESOPHAGOGASTRODUODENOSCOPY (EGD) WITH PROPOFOL N/A 09/03/2016   Procedure: ESOPHAGOGASTRODUODENOSCOPY (EGD) WITH PROPOFOL;  Surgeon: Doran Stabler, MD;  [facility];  Service: Gastroenterology;  Laterality: N/A;  . HYSTEROSCOPY W/D&C  08-19-2007   PMB  . KNEE ARTHROSCOPY W/ DEBRIDEMENT Left 03/29/2006   INTERNAL DERANGEMENT/ SEVERE DJD/ MENISCUS TEARS  . LAPAROSCOPIC CHOLECYSTECTOMY  06-10-2002  . LAPAROSCOPIC GASTRIC BANDING  03/01/2006   TRUNCAL VAGOTOMY/ PLACEMENT OF VG BAND  . REVISION TOTAL KNEE ARTHROPLASTY Left 08-29-2008; 05/2009  . TONSILLECTOMY  1969  . TOTAL KNEE ARTHROPLASTY Left 01-23-2007   SEVERE DJD  .  TOTAL THYROIDECTOMY  11-22-2005   BILATERAL THYROID NODULES-- PAPILLARY CARCINOMA (0.5CM)/ ADENOMATOID NODULES  . TRANSTHORACIC ECHOCARDIOGRAM  12-27-2010   LVSF NORMAL / EF 79-39%/ GRADE I DIASTOLIC DYSFUNCTION/ MILD MITRAL REGURG. / MILDLY DILATED LEFT ATRIUM/ MILDY INCREASED SYSTOLIC PRESSURE OF PULMONARY ARTERIES  . TRANSVAGINAL SUBURETERAL TAPE/ SLING  09-28-2010   MIXED URINARY INCONTINENCE  . TUBAL LIGATION  1983    Social History   Socioeconomic History  . Marital status: Married    Spouse name: Not on file  . Number of children: 2  . Years of education: 43  . Highest education  level: Not on file  Occupational History  . Occupation: disabled    Fish farm manager: UNEMPLOYED  Social Needs  . Financial resource strain: Not on file  . Food insecurity:    Worry: Not on file    Inability: Not on file  . Transportation needs:    Medical: Not on file    Non-medical: Not on file  Tobacco Use  . Smoking status: Former Smoker    Packs/day: 2.50    Years: 39.00    Pack years: 97.50    Types: Cigarettes    Last attempt to quit: 10/22/2010    Years since quitting: 7.3  . Smokeless tobacco: Never Used  Substance and Sexual Activity  . Alcohol use: No    Alcohol/week: 0.0 oz  . Drug use: No  . Sexual activity: Not Currently  Lifestyle  . Physical activity:    Days per week: Not on file    Minutes per session: Not on file  . Stress: Not on file  Relationships  . Social connections:    Talks on phone: Not on file    Gets together: Not on file    Attends religious service: Not on file    Active member of club or organization: Not on file    Attends meetings of clubs or organizations: Not on file    Relationship status: Not on file  . Intimate partner violence:    Fear of current or ex partner: Not on file    Emotionally abused: Not on file    Physically abused: Not on file    Forced sexual activity: Not on file  Other Topics Concern  . Not on file  Social History Narrative   Lives at home w/ her husband and grandson   Right-handed   Caffeine: 1 cup of coffee per week + [employer]    Current Outpatient Medications on File Prior to Visit  Medication Sig Dispense Refill  . albuterol (PROVENTIL HFA;VENTOLIN HFA) 108 (90 Base) MCG/ACT inhaler Inhale 2 puffs into the lungs every 6 (six) hours as needed for wheezing or shortness of breath. 1 Inhaler 2  . amitriptyline (ELAVIL) 50 MG tablet Take 2 tablets (100 mg total) by mouth at bedtime.    Marland Kitchen amLODipine (NORVASC) 5 MG tablet TAKE 1 TABLET BY MOUTH TWICE A DAY 180 tablet 1  . atorvastatin (LIPITOR) 40 MG tablet TAKE 1  TABLET BY MOUTH EVERY DAY 90 tablet 3  . busPIRone (BUSPAR) 7.5 MG tablet Take 1 tablet (7.5 mg total) by mouth 3 (three) times daily as needed. 270 tablet 1  . donepezil (ARICEPT) 10 MG tablet Take 1 tablet (10 mg total) at bedtime by mouth. 90 tablet 3  . hydrALAZINE (APRESOLINE) 25 MG tablet Take 1 tablet (25 mg total) by mouth 2 (two) times daily. 180 tablet 3  . HYDROmorphone (DILAUDID) 4 MG tablet Take 1 tablet (4 mg total)  by mouth every 8 (eight) hours as needed for moderate pain. (Patient taking differently: Take 4 mg by mouth 3 (three) times daily. )    . Insulin Syringe-Needle U-100 (INSULIN SYRINGE 1CC/30GX5/16") 30G X 5/16" 1 ML MISC Use as directed for insulin injections 5 times daily 150 each 1  . Ipratropium-Albuterol (COMBIVENT RESPIMAT) 20-100 MCG/ACT AERS respimat Inhale 1 puff into the lungs every 6 (six) hours. (Patient taking differently: Inhale 1 puff into the lungs every 6 (six) hours as needed for wheezing or shortness of breath. ) 1 Inhaler 3  . ipratropium-albuterol (DUONEB) 0.5-2.5 (3) MG/3ML SOLN Take 3 mLs by nebulization every 6 (six) hours as needed (Wheezing or dyspnea.).    Marland Kitchen levothyroxine (SYNTHROID, LEVOTHROID) 175 MCG tablet TAKE 1 TABLET BY MOUTH SIX DAYS A WEEK BEFORE BREAKFAST 90 tablet 0  . Lidocaine HCl (ASPERCREME LIDOCAINE) 4 % LIQD Apply 1 application topically daily as needed (pain).    . meloxicam (MOBIC) 7.5 MG tablet Take 15 mg by mouth daily.   5  . metoprolol tartrate (LOPRESSOR) 25 MG tablet Take 1 tablet (25 mg total) by mouth 2 (two) times daily. 180 tablet 3  . morphine (MS CONTIN) 30 MG 12 hr tablet Take 30 mg by mouth 2 (two) times daily.    . ondansetron (ZOFRAN-ODT) 4 MG disintegrating tablet Take 1 tablet (4 mg total) by mouth every 6 (six) hours as needed for nausea or vomiting. 20 tablet 1  . potassium chloride SA (KLOR-CON M20) 20 MEQ tablet TAKE 2 TABLETS EVERY DAY AS NEEDED FOR CRAMPING 180 tablet 1  . tiZANidine (ZANAFLEX) 2 MG tablet  Take 2 mg by mouth 3 (three) times daily as needed for muscle spasms.   2   No current facility-administered medications on file prior to visit.     Allergies  Allergen Reactions  . Gabapentin Swelling    Swelling in legs Swelling in legs Swelling in legs  . Losartan Other (See Comments)    Myalgias and muscle cramping Other reaction(s): Other (See Comments) Myalgias and muscle cramping Myalgias and muscle cramping Other reaction(s): Other (See Comments) Myalgias and muscle cramping  . Oxycodone Itching  . Sulfa Antibiotics Nausea Only and Rash  . Sulfonamide Derivatives Nausea Only    Family History  Problem Relation Age of Onset  . Diabetes Mother   . Heart disease Mother   . Dementia Mother   . Heart disease Father   . High blood pressure Father   . Colon cancer Maternal Uncle        x 2  . Breast cancer Other        great aunts x 5    BP (!) 192/82 (BP Location: Left Arm, Patient Position: Sitting, Cuff Size: Normal)   Pulse 93   Wt 267 lb 6.4 oz (121.3 kg)   SpO2 92%   BMI 39.49 kg/m    Review of Systems She denies hypoglycemia.  She has lost 20 lbs since last ov.      Objective:   Physical Exam VITAL SIGNS:  See vs page GENERAL: no distress.  Morbid obesity Pulses: dorsalis pedis intact bilat.   MSK: no deformity of the feet, except for bilat hammer toes  CV: 1+ bilat leg edema, bilat vv's, and hyperpigmentation of the legs.   Skin:  no ulcer on the feet.  normal color and temp on the feet. Neuro: sensation is intact to touch on the feet, but decreased from normal.   Ext: There is  bilateral onychomycosis of the toenails.   Lab Results  Component Value Date   HGBA1C 9.9 (A) 02/24/2018      Assessment & Plan:  Type 1 DM, with DR: poor glycemic control.  Weight loss, prob due to severe hyperglycemia.   HTN: is noted today.   Patient Instructions  Your blood pressure is high today.  Please see your primary care provider soon, to have it  recheckedcheck your blood sugar twice a day.  vary the time of day when you check, between before the 3 meals, and at bedtime.  also check if you have symptoms of your blood sugar being too high or too low.  please keep a record of the readings and bring it to your next appointment here (or you can bring the meter itself).  You can write it on any piece of paper.  please call us sooner if your blood sugar goes below 70, or if you have a lot of readings over 200. Please increase the NPH to 150 units each morning.  Please come back for a follow-up appointment in 2 months.

## 2018-02-24 NOTE — Patient Instructions (Addendum)
Your blood pressure is high today.  Please see your primary care provider soon, to have it recheckedcheck your blood sugar twice a day.  vary the time of day when you check, between before the 3 meals, and at bedtime.  also check if you have symptoms of your blood sugar being too high or too low.  please keep a record of the readings and bring it to your next appointment here (or you can bring the meter itself).  You can write it on any piece of paper.  please call us sooner if your blood sugar goes below 70, or if you have a lot of readings over 200. Please increase the NPH to 150 units each morning.  Please come back for a follow-up appointment in 2 months.

## 2018-03-02 ENCOUNTER — Ambulatory Visit: Payer: Medicare HMO | Admitting: Pulmonary Disease

## 2018-03-03 ENCOUNTER — Telehealth: Payer: Self-pay | Admitting: Gastroenterology

## 2018-03-03 NOTE — Telephone Encounter (Signed)
It is not known to do so.  If her doctor thinks she needs it for cholesterol, she should take it.

## 2018-03-03 NOTE — Telephone Encounter (Signed)
Routed to Dr. Danis. 

## 2018-03-03 NOTE — Telephone Encounter (Signed)
Patient advised.

## 2018-03-04 DIAGNOSIS — J449 Chronic obstructive pulmonary disease, unspecified: Secondary | ICD-10-CM | POA: Diagnosis not present

## 2018-03-05 NOTE — Progress Notes (Signed)
HPI: FU CAD and pulmonary hypertension. Patient had a cardiac catheterization in 2006 which was normal. Carotid Dopplers March 2012 showed no significant obstructive disease. Nuclear study 5/12 suggested mild inferior ischemia and EF 69. Cardiac CT May 2012 showed a calcium score of 0 and no plaque or stenosis in the coronary arteries. Echocardiogram 1/16 showed normal LV function.  Previously noted to have coronary calcification and pulmonary hypertension on CT scan.  Last echocardiogram January 2018 showed normal LV systolic function, grade 1 diastolic dysfunction and normal right ventricular size.  Patient seen in the emergency room April 2019 with palpitations and electrocardiogram showed heart rate 150 with right bundle branch block.  Question atrial flutter vs SVT.  Seen in follow-up by Rosaria Ferries and metoprolol increased.  Monitor May 2019 showed sinus to sinus tachycardia with no significant arrhythmia.  Since last seen, patient has some dyspnea on exertion but no orthopnea, PND, pedal edema, chest pain or syncope. She states her systolic blood pressure has been running 150.  Current Outpatient Medications  Medication Sig Dispense Refill  . albuterol (PROVENTIL HFA;VENTOLIN HFA) 108 (90 Base) MCG/ACT inhaler Inhale 2 puffs into the lungs every 6 (six) hours as needed for wheezing or shortness of breath. 1 Inhaler 2  . amitriptyline (ELAVIL) 50 MG tablet Take 2 tablets (100 mg total) by mouth at bedtime.    Marland Kitchen amLODipine (NORVASC) 5 MG tablet TAKE 1 TABLET BY MOUTH TWICE A DAY 180 tablet 1  . atorvastatin (LIPITOR) 40 MG tablet TAKE 1 TABLET BY MOUTH EVERY DAY 90 tablet 3  . busPIRone (BUSPAR) 7.5 MG tablet Take 1 tablet (7.5 mg total) by mouth 3 (three) times daily as needed. 270 tablet 1  . donepezil (ARICEPT) 10 MG tablet Take 1 tablet (10 mg total) at bedtime by mouth. 90 tablet 3  . hydrALAZINE (APRESOLINE) 25 MG tablet Take 1 tablet (25 mg total) by mouth 2 (two) times daily. 180  tablet 3  . HYDROmorphone (DILAUDID) 4 MG tablet Take 1 tablet (4 mg total) by mouth every 8 (eight) hours as needed for moderate pain. (Patient taking differently: Take 4 mg by mouth 3 (three) times daily. )    . insulin glargine (LANTUS) 100 UNIT/ML injection Inject into the skin as directed.    . Insulin NPH Human, Isophane, (NOVOLIN N [place]) Inject into the skin as directed.    . Insulin Syringe-Needle U-100 (INSULIN SYRINGE 1CC/30GX5/16") 30G X 5/16" 1 ML MISC Use as directed for insulin injections 5 times daily 150 each 1  . Ipratropium-Albuterol (COMBIVENT RESPIMAT) 20-100 MCG/ACT AERS respimat Inhale 1 puff into the lungs every 6 (six) hours. (Patient taking differently: Inhale 1 puff into the lungs every 6 (six) hours as needed for wheezing or shortness of breath. ) 1 Inhaler 3  . ipratropium-albuterol (DUONEB) 0.5-2.5 (3) MG/3ML SOLN Take 3 mLs by nebulization every 6 (six) hours as needed (Wheezing or dyspnea.).    Marland Kitchen levothyroxine (SYNTHROID, LEVOTHROID) 175 MCG tablet TAKE 1 TABLET BY MOUTH SIX DAYS A WEEK BEFORE BREAKFAST 90 tablet 0  . Lidocaine HCl (ASPERCREME LIDOCAINE) 4 % LIQD Apply 1 application topically daily as needed (pain).    . meloxicam (MOBIC) 7.5 MG tablet Take 15 mg by mouth daily.   5  . metoprolol tartrate (LOPRESSOR) 25 MG tablet Take 1 tablet (25 mg total) by mouth 2 (two) times daily. 180 tablet 3  . morphine (MS CONTIN) 30 MG 12 hr tablet Take 30 mg by mouth  2 (two) times daily.    . ondansetron (ZOFRAN-ODT) 4 MG disintegrating tablet Take 1 tablet (4 mg total) by mouth every 6 (six) hours as needed for nausea or vomiting. 20 tablet 1  . potassium chloride SA (KLOR-CON M20) 20 MEQ tablet TAKE 2 TABLETS EVERY DAY AS NEEDED FOR CRAMPING 180 tablet 1  . tiZANidine (ZANAFLEX) 2 MG tablet Take 2 mg by mouth 3 (three) times daily as needed for muscle spasms.   2   No current facility-administered medications for this visit.      Past Medical History:  Diagnosis Date  .  AKI (acute kidney injury) ([place]) 01/2017  . Anxiety    with panic attacks  . Arthritis    "back; feet; hands; shoulders" (08/26/2014)  . Asthma   . Cervical cancer ([place])   . Chronic lower back pain   . Chronic narcotic use   . Chronic pain syndrome    [facility]  . Cirrhosis ([place])   . Clostridium difficile infection 2017  . COPD (chronic obstructive pulmonary disease) ([place])   . Daily headache   . Depression   . Diabetic neuropathy ([place]) 06/04/2017  . DJD (degenerative joint disease)   . Fatty liver disease, nonalcoholic   . Fibromyalgia   . Frequency of urination   . HCAP (healthcare-associated pneumonia) 08/26/2014  . History of TIA (transient ischemic attack) 11-01-2010   NO RESIDUAL  . Hyperlipidemia   . Hypertension   . Hypothyroidism   . IDDM (insulin dependent diabetes mellitus) ([place])   . Insomnia   . Lumbar stenosis   . Memory difficulty 07/25/2016  . Nocturia   . OSA (obstructive sleep apnea)    NO CPAP SINCE WT LOSS  . Osteoarthritis    with severe disease in knee  . Pneumonia "several times"  . Polymyalgia rheumatica ([place])   . Scoliosis   . Seasonal allergies   . Thyroid cancer ([place])   . Urgency of urination   . Vaginal pain S/P SLING  FEB 2012    Past Surgical History:  Procedure Laterality Date  . APPENDECTOMY  1982  . BREAST EXCISIONAL BIOPSY Left 02/28/2005   Atypical Ductal Hyperplasia  . CARDIAC CATHETERIZATION  09-04-2004   NORMAL CORONARY ANATOMY/ NORMAL LVF/ EF 60%  . CARDIOVASCULAR STRESS TEST  12-27-2010  DR [place]   ABNORMAL NUCLEAR STUDY W/ /MILD INFERIOR ISCHEMIA/ EF 69%/  CT HEART ANGIOGRAM ;  NO ACUTE FINDINGS  . CRYOABLATION  05/16/2003   w/LEEP FOR ABNORMAL PAP SMEAR  . CYSTOSCOPY  05/18/2012   Procedure: CYSTOSCOPY;  Surgeon: Reece Packer, MD;  Location: [facility];  Service: Urology;  Laterality: N/A;  examination under anethesia  . ESOPHAGOGASTRODUODENOSCOPY (EGD) WITH PROPOFOL N/A 09/03/2016    Procedure: ESOPHAGOGASTRODUODENOSCOPY (EGD) WITH PROPOFOL;  Surgeon: Doran Stabler, MD;  Location: [facility];  Service: Gastroenterology;  Laterality: N/A;  . HYSTEROSCOPY W/D&C  08-19-2007   PMB  . KNEE ARTHROSCOPY W/ DEBRIDEMENT Left 03/29/2006   INTERNAL DERANGEMENT/ SEVERE DJD/ MENISCUS TEARS  . LAPAROSCOPIC CHOLECYSTECTOMY  06-10-2002  . LAPAROSCOPIC GASTRIC BANDING  03/01/2006   TRUNCAL VAGOTOMY/ PLACEMENT OF VG BAND  . REVISION TOTAL KNEE ARTHROPLASTY Left 08-29-2008; 05/2009  . TONSILLECTOMY  1969  . TOTAL KNEE ARTHROPLASTY Left 01-23-2007   SEVERE DJD  . TOTAL THYROIDECTOMY  11-22-2005   BILATERAL THYROID NODULES-- PAPILLARY CARCINOMA (0.5CM)/ ADENOMATOID NODULES  . TRANSTHORACIC ECHOCARDIOGRAM  12-27-2010   LVSF NORMAL / EF 20-94%/ GRADE I DIASTOLIC  DYSFUNCTION/ MILD MITRAL REGURG. / MILDLY DILATED LEFT ATRIUM/ MILDY INCREASED SYSTOLIC PRESSURE OF PULMONARY ARTERIES  . TRANSVAGINAL SUBURETERAL TAPE/ SLING  09-28-2010   MIXED URINARY INCONTINENCE  . TUBAL LIGATION  1983    Social History   Socioeconomic History  . Marital status: Married    Spouse name: Not on file  . Number of children: 2  . Years of education: 6  . Highest education level: Not on file  Occupational History  . Occupation: disabled    Fish farm manager: UNEMPLOYED  Social Needs  . Financial resource strain: Not on file  . Food insecurity:    Worry: Not on file    Inability: Not on file  . Transportation needs:    Medical: Not on file    Non-medical: Not on file  Tobacco Use  . Smoking status: Former Smoker    Packs/day: 2.50    Years: 39.00    Pack years: 97.50    Types: Cigarettes    Last attempt to quit: 10/22/2010    Years since quitting: 7.3  . Smokeless tobacco: Never Used  Substance and Sexual Activity  . Alcohol use: No    Alcohol/week: 0.0 oz  . Drug use: No  . Sexual activity: Not Currently  Lifestyle  . Physical activity:    Days per week: Not on file    Minutes per session: Not  on file  . Stress: Not on file  Relationships  . Social connections:    Talks on phone: Not on file    Gets together: Not on file    Attends religious service: Not on file    Active member of club or organization: Not on file    Attends meetings of clubs or organizations: Not on file    Relationship status: Not on file  . Intimate partner violence:    Fear of current or ex partner: Not on file    Emotionally abused: Not on file    Physically abused: Not on file    Forced sexual activity: Not on file  Other Topics Concern  . Not on file  Social History Narrative   Lives at home w/ her husband and grandson   Right-handed   Caffeine: 1 cup of coffee per week + [employer]    Family History  Problem Relation Age of Onset  . Diabetes Mother   . Heart disease Mother   . Dementia Mother   . Heart disease Father   . High blood pressure Father   . Colon cancer Maternal Uncle        x 2  . Breast cancer Other        great aunts x 5    ROS: no fevers or chills, productive cough, hemoptysis, dysphasia, odynophagia, melena, hematochezia, dysuria, hematuria, rash, seizure activity, orthopnea, PND, pedal edema, claudication. Remaining systems are negative.  Physical Exam: Well-developed obese in no acute distress.  Skin is warm and dry.  HEENT is normal.  Neck is supple.  Chest is clear to auscultation with normal expansion.  Cardiovascular exam is regular rate and rhythm.  Abdominal exam nontender or distended. No masses palpated. Extremities show trace edema. neuro grossly intact  A/P  1 supraventricular tachycardia-patient has had no recurrent episodes.  Continue beta-blocker.  2 hypertension-blood pressure is controlled today but she states her systolic is 169 at home.  Increase hydralazine to 50 mg twice daily and follow.  3 hyperlipidemia-continue statin.  4 pulmonary hypertension-this was noted on previous CT scan.  This  is felt likely multifactorial including history of  COPD, obstructive sleep apnea and obesity hypoventilation syndrome.  Continue efforts at weight loss and follow-up with pulmonary.  She is using CPAP.  5 coronary artery calcification-continue statin.  Add aspirin 81 mg daily.  She has not had chest pain.  6 diabetes mellitus-managed by primary care.  Kirk Ruths, MD

## 2018-03-10 ENCOUNTER — Ambulatory Visit (INDEPENDENT_AMBULATORY_CARE_PROVIDER_SITE_OTHER): Payer: Medicare HMO | Admitting: Cardiology

## 2018-03-10 ENCOUNTER — Encounter: Payer: Self-pay | Admitting: Cardiology

## 2018-03-10 VITALS — BP 132/68 | HR 66 | Ht 69.0 in | Wt 291.0 lb

## 2018-03-10 DIAGNOSIS — I272 Pulmonary hypertension, unspecified: Secondary | ICD-10-CM

## 2018-03-10 DIAGNOSIS — I2510 Atherosclerotic heart disease of native coronary artery without angina pectoris: Secondary | ICD-10-CM | POA: Diagnosis not present

## 2018-03-10 DIAGNOSIS — I10 Essential (primary) hypertension: Secondary | ICD-10-CM | POA: Diagnosis not present

## 2018-03-10 DIAGNOSIS — E7800 Pure hypercholesterolemia, unspecified: Secondary | ICD-10-CM

## 2018-03-10 MED ORDER — ASPIRIN EC 81 MG PO TBEC: 81.0000 mg | DELAYED_RELEASE_TABLET | Freq: Every day | ORAL | 3 refills | Status: DC

## 2018-03-10 MED ORDER — HYDRALAZINE HCL 50 MG PO TABS: 50.0000 mg | ORAL_TABLET | Freq: Two times a day (BID) | ORAL | 3 refills | Status: DC

## 2018-03-10 NOTE — Patient Instructions (Signed)
Medication Instructions:   START ASPIRIN 81 MG ONCE DAILY  INCREASE HYDRALAZINE TO 50 MG TWICE DAILY = 2 OF THE 25 MG TABLETS TWICE DAILY  Follow-Up:  Your physician wants you to follow-up in: 6 MONTHS WITH DR Stanford Breed You will receive a reminder letter in the mail two months in advance. If you don't receive a letter, please call our office to schedule the follow-up appointment.   If you need a refill on your cardiac medications before your next appointment, please call your pharmacy.

## 2018-03-11 DIAGNOSIS — Z0289 Encounter for other administrative examinations: Secondary | ICD-10-CM | POA: Diagnosis not present

## 2018-03-11 DIAGNOSIS — G894 Chronic pain syndrome: Secondary | ICD-10-CM | POA: Diagnosis not present

## 2018-03-11 DIAGNOSIS — E118 Type 2 diabetes mellitus with unspecified complications: Secondary | ICD-10-CM | POA: Diagnosis not present

## 2018-03-11 DIAGNOSIS — Z5181 Encounter for therapeutic drug level monitoring: Secondary | ICD-10-CM | POA: Diagnosis not present

## 2018-03-11 DIAGNOSIS — J449 Chronic obstructive pulmonary disease, unspecified: Secondary | ICD-10-CM | POA: Diagnosis not present

## 2018-03-11 DIAGNOSIS — Z794 Long term (current) use of insulin: Secondary | ICD-10-CM | POA: Diagnosis not present

## 2018-03-11 DIAGNOSIS — M533 Sacrococcygeal disorders, not elsewhere classified: Secondary | ICD-10-CM | POA: Diagnosis not present

## 2018-03-11 DIAGNOSIS — Z79891 Long term (current) use of opiate analgesic: Secondary | ICD-10-CM | POA: Diagnosis not present

## 2018-03-11 DIAGNOSIS — M1710 Unilateral primary osteoarthritis, unspecified knee: Secondary | ICD-10-CM | POA: Diagnosis not present

## 2018-03-11 DIAGNOSIS — M5137 Other intervertebral disc degeneration, lumbosacral region: Secondary | ICD-10-CM | POA: Diagnosis not present

## 2018-03-12 ENCOUNTER — Encounter: Payer: Self-pay | Admitting: Internal Medicine

## 2018-03-17 ENCOUNTER — Ambulatory Visit (INDEPENDENT_AMBULATORY_CARE_PROVIDER_SITE_OTHER): Payer: Medicare HMO | Admitting: Internal Medicine

## 2018-03-17 ENCOUNTER — Encounter: Payer: Self-pay | Admitting: Internal Medicine

## 2018-03-17 VITALS — BP 122/70 | HR 79 | Ht 69.0 in | Wt 294.0 lb

## 2018-03-17 DIAGNOSIS — R0609 Other forms of dyspnea: Secondary | ICD-10-CM | POA: Diagnosis not present

## 2018-03-17 DIAGNOSIS — R942 Abnormal results of pulmonary function studies: Secondary | ICD-10-CM

## 2018-03-17 DIAGNOSIS — Z87891 Personal history of nicotine dependence: Secondary | ICD-10-CM

## 2018-03-17 DIAGNOSIS — R0602 Shortness of breath: Secondary | ICD-10-CM | POA: Diagnosis not present

## 2018-03-17 DIAGNOSIS — R0600 Dyspnea, unspecified: Secondary | ICD-10-CM

## 2018-03-17 NOTE — Progress Notes (Signed)
Subjective:     Patient ID: Mary Acosta, female   DOB: February 04, 1956, 62 y.o.   MRN: [id number]  HPI  PCP Thressa Sheller, MD   HPI  IOV 09/26/2014  Chief Complaint  Patient presents with  . Pulmonary Consult    Pt referred by Dr. Sherald Barge for pneumonia.     62 year-old morbidly obese female with multiple medical problems including obesity, chronic pain, diabetes says has a long history of COPD not otherwise specified and quit smoking 4 years ago [close to 100 pack smoking history]. At baseline prior to December 2015 she says she was quite functional without much of dyspnea for activities of daily living. Then in mid November 2015 had an admission for hyperglycemia. This was followed by an admission 08/26/2014 08/29/2014 for hospital-acquired pneumonia based on a CT scan of the chest 08/26/2014 that showed right lower lobe infiltrate but ruled out pulmonary embolism. Readmitted 09/08/2014 through gender 17 2016 for COPD exacerbation. Since these admissions she says she is no longer as functional. ECOG functional status is now f4. She spends most of the day sitting or in bed. Only movement is to the bathroom and back to the bed and maybe to the dining room. These activities make extremely dyspneic that she still barely able to do her functional activities such as changing clothes or taking a shower. These activities make extremely dyspneic. Since discharge from the hospital she's not using her Spiriva because of chest tightness from Spiriva. In fact her husband says that she's not using any of her inhalers. She reports class III-for dyspnea associated with mild cough that is dry.  She's not had any PFTs in a long time according to her history  Chest x-ray 09/08/2014 lung fields are clear  She needed a wheel chair to get into ouroffice due to dyspnea  She is not on o2 at home.  Walking desaturation test 185 feet x 3 laps on RA: did only 3/4 of 1 lap. STOPPED 2 tims. Pulse ox stated at  100%. HR rose to 107/min  11/01/2014 Follow up PFT /COPD? Patient returns for a follow-up visit and review PFT Patient was seen one month ago for pulmonary consultation for possible COPD. She is a former smoker had a recent hospital. They should for hospital-acquired pneumonia , and COPD exacerbation. Patient had persistent shortness of breath. He had no desaturations with walking in office. PFTs today showed an FEV1 of 82%, ratio 81, FVC at 79%, no significant bronchodilator response, decreased diffusing capacity is 57%. Patient is on an ACE inhibitor, chronic narcotics, and is mildly obese with weight at 291. Patient had a CT chest in January 2016 was negative for pulmonary embolism, showed a right lower lobe pneumonia, follow-up chest x-ray was negative 2-D echo done in January showed severe LVH, EF 55% , GR 1 DD  Tried spiriva in past, intolerant.  Started on Duoneb feels it helps her breathing.  Says she was rechecked for sleep apnea in past  and test was neg.  Previous lap band with loss of 60lbs (high wt was 346) . Wt starting to tr up.  Has chronic pain .-back, knee  and FM  Started on PT last ov. Hard on her knees.  She denies any chest pain, orthopnea, PND, or increased leg swelling  OV  01/18/2015  Chief Complaint  Patient presents with  . Follow-up    Pt stated she is feeling better since sleeping with O2. Pt c/o DOE, weakness--pt requesting a wheelchair. Pt  denies CP/tightness and cough.     Follow-up mild COPD with chronic respiratory failure in the setting of obesity, chronic pain and poor functional status  Presents with her husband. She says that oxygen therapy has helped. She still got poor functional status due to obesity and pain. She wants a wheelchair. She is compliant with her oxygen and nebulizers. There is no new issues.    TEST  10/2014 PFTs today showed an FEV1 of 82%, ratio 81, FVC at 79%, no significant bronchodilator response, decreased diffusing capacity is  57%. 2-D echo done in January showed severe LVH, EF 55% , GR 1 DD  Tried spiriva in past, intolerant.  2016 ONO showed nocturnal desats she was started on O2  2016 sleep study which was neg for OSA.    10/23/2015 Follow up : CODP and Chronic O2  Pt returns for 9 month follow up for COPD .  Remains on Duoneb Four times a day   Says her cough and wheezing are much better since stopping Lisinopril last year.  Denies hemoptysis , chest pain, orthopnea, or increased edema.   Says she was recently dx with fatty liver/non alcoholic cirrhosis .  Has chronic pain on morphine and dilaudid. Followed [facility] pain [facility].  Flu and PVX utd.  Would like to get Prevnar vaccine today.    OV 04/30/2016  Chief Complaint  Patient presents with  . Follow-up    Pt reports her breathing is fair. C/o congestion, prod cough (green phlem).    Follow-up mild/moderate COPD but with high symptom burden because of multiple medical problems obesity and chronic opioid use  Last visit her in 2017 with nurse practitioner. Since then May 2017 she had recurrent C. difficile she apparently was a fifth episode. Overall she's stable but for the last week she is reporting increased cough compared to baseline and change in color of sputum to green color but no increased shortness of breath or wheeze or edema or chest pain or orthopnea or nocturnal dyspnea. This no fever or chills.  OV 07/30/2016  Chief Complaint  Patient presents with  . Follow-up    Increased cough for the past wk- prod with large amounts of green/brown sputum.  She states "I am probably more SOB"  She has been using albuterol inhaler 2 x daily on average and also is using Duonebs 2 x daily.      Follow-up mild COPD with isolated low dlco in the setting of obesity, chronic pain and poor functional status  - Presents with her husband for follow-up. For the last 2 weeks has had increased cough, brown sputum, wheezing and chest tightness all consistent  with a COPD exacerbation. Suspected sick contacts but she's not sure. She also run out of her Combivent rescue MDI but she uses DuoNeb 4 times daily now compliant basis. This no fever or chills. She think she'll benefit from antibiotic and prednisone.  Last CT chest in 2016 showed pneumonia in the only follow-up was in April 2017 that showed clearance with a chest x-ray but no CT chest  No alpha 1 check so far    OV 03/17/2018  Chief Complaint  Patient presents with  . Follow-up    Pt states she has noticed that with ambulation, her O2 sats do drop and sometimes she does feel SOB is worse. Pt denies any complaints of cough or CP.    Mary Acosta , 62 y.o. , with dob 01/21/1956 and female ,Not Hispanic or  Latino from [place] [id number] - presents to lung clinic for dyspnea and copd followup MM phenotyep Last CT scan of the chest December 2017. Last pulmonary function test with isolated reduction in diffusion capacity in 2016. I personally have not seen her in over 18 months. She tells me overall she's stable but she is hampered by significant dyspnea on exertion relieved by rest. COPD cat score is 28 and symptom severity is documented below. She is seeing Dr. Elsworth Soho for sleep apnea. She also feels that she suffers from mixed insomnia and sometimes from hypersomnia. She feels his sleep apnea is going to kill her. She has been advised to take an appointment with Dr. Elsworth Soho she believes she already has. There are no other new issues.    CAT COPD Symptom & Quality of Life Score (GSK trademark) 0 is no burden. 5 is highest burden 03/17/2018   Never Cough -> Cough all the time 1  No phlegm in chest -> Chest is full of phlegm 2  No chest tightness -> Chest feels very tight 2  No dyspnea for 1 flight stairs/hill -> Very dyspneic for 1 flight of stairs 5  No limitations for ADL at home -> Very limited with ADL at home 5  Confident leaving home -> Not at all confident leaving  home 4  Sleep soundly -> Do not sleep soundly because of lung condition 4  Lots of Energy -> No energy at all 5  TOTAL Score (max 40)  28          Results for TAREN, DYMEK (MRN [id number]) as of 07/30/2016 11:21  Ref. Range 11/01/2014 09:59  FVC-Post Latest Units: L 3.19  FVC-%Pred-Post Latest Units: % 77  FVC-%Change-Post Latest Units: % -2  FEV1-Post Latest Units: L 2.67  FEV1-%Pred-Post Latest Units: % 83  Results for IRJA, WHELESS (MRN [id number]) as of 07/30/2016 11:21  Ref. Range 11/01/2014 09:59  DLCO unc Latest Units: ml/min/mmHg 18.56  DLCO unc % pred Latest Units: % 57      has a past medical history of AKI (acute kidney injury) ([place]) (01/2017), Anxiety, Arthritis, Asthma, Cervical cancer ([place]), Chronic lower back pain, Chronic narcotic use, Chronic pain syndrome, Cirrhosis ([place]), Clostridium difficile infection (2017), COPD (chronic obstructive pulmonary disease) ([place]), Daily headache, Depression, Diabetic neuropathy ([place]) (06/04/2017), DJD (degenerative joint disease), Fatty liver disease, nonalcoholic, Fibromyalgia, Frequency of urination, HCAP (healthcare-associated pneumonia) (08/26/2014), History of TIA (transient ischemic attack) (11-01-2010), Hyperlipidemia, Hypertension, Hypothyroidism, IDDM (insulin dependent diabetes mellitus) ([place]), Insomnia, Lumbar stenosis, Memory difficulty (07/25/2016), Nocturia, OSA (obstructive sleep apnea), Osteoarthritis, Pneumonia ("several times"), Polymyalgia rheumatica ([place]), Scoliosis, Seasonal allergies, Thyroid cancer ([place]), Urgency of urination, and Vaginal pain (S/P SLING  FEB 2012).   reports that she quit smoking about 7 years ago. Her smoking use included cigarettes. She has a 97.50 pack-year smoking history. She has never used smokeless tobacco.  Past Surgical History:  Procedure Laterality Date  . APPENDECTOMY  1982  . BREAST EXCISIONAL BIOPSY Left 02/28/2005   Atypical Ductal Hyperplasia  . CARDIAC  CATHETERIZATION  09-04-2004   NORMAL CORONARY ANATOMY/ NORMAL LVF/ EF 60%  . CARDIOVASCULAR STRESS TEST  12-27-2010  DR [place]   ABNORMAL NUCLEAR STUDY W/ /MILD INFERIOR ISCHEMIA/ EF 69%/  CT HEART ANGIOGRAM ;  NO ACUTE FINDINGS  . CRYOABLATION  05/16/2003   w/LEEP FOR ABNORMAL PAP SMEAR  . CYSTOSCOPY  05/18/2012   Procedure: CYSTOSCOPY;  Surgeon: Reece Packer, MD;  Location: [facility];  Service: Urology;  Laterality: N/A;  examination under anethesia  . ESOPHAGOGASTRODUODENOSCOPY (EGD) WITH PROPOFOL N/A 09/03/2016   Procedure: ESOPHAGOGASTRODUODENOSCOPY (EGD) WITH PROPOFOL;  Surgeon: Doran Stabler, MD;  Location: [facility];  Service: Gastroenterology;  Laterality: N/A;  . HYSTEROSCOPY W/D&C  08-19-2007   PMB  . KNEE ARTHROSCOPY W/ DEBRIDEMENT Left 03/29/2006   INTERNAL DERANGEMENT/ SEVERE DJD/ MENISCUS TEARS  . LAPAROSCOPIC CHOLECYSTECTOMY  06-10-2002  . LAPAROSCOPIC GASTRIC BANDING  03/01/2006   TRUNCAL VAGOTOMY/ PLACEMENT OF VG BAND  . REVISION TOTAL KNEE ARTHROPLASTY Left 08-29-2008; 05/2009  . TONSILLECTOMY  1969  . TOTAL KNEE ARTHROPLASTY Left 01-23-2007   SEVERE DJD  . TOTAL THYROIDECTOMY  11-22-2005   BILATERAL THYROID NODULES-- PAPILLARY CARCINOMA (0.5CM)/ ADENOMATOID NODULES  . TRANSTHORACIC ECHOCARDIOGRAM  12-27-2010   LVSF NORMAL / EF 41-66%/ GRADE I DIASTOLIC DYSFUNCTION/ MILD MITRAL REGURG. / MILDLY DILATED LEFT ATRIUM/ MILDY INCREASED SYSTOLIC PRESSURE OF PULMONARY ARTERIES  . TRANSVAGINAL SUBURETERAL TAPE/ SLING  09-28-2010   MIXED URINARY INCONTINENCE  . TUBAL LIGATION  1983    Allergies  Allergen Reactions  . Gabapentin Swelling    Swelling in legs Swelling in legs Swelling in legs  . Losartan Other (See Comments)    Myalgias and muscle cramping Other reaction(s): Other (See Comments) Myalgias and muscle cramping Myalgias and muscle cramping Other reaction(s): Other (See Comments) Myalgias and muscle cramping  . Oxycodone Itching   . Sulfa Antibiotics Nausea Only and Rash  . Sulfonamide Derivatives Nausea Only    Immunization History  Administered Date(s) Administered  . Hep A / Hep B 02/05/2017  . Hepatitis B, adult 03/05/2017, 08/14/2017  . Influenza,inj,Quad PF,6+ Mos 05/27/2015, 05/30/2016, 06/13/2017  . Influenza-Unspecified 05/26/2014  . Pneumococcal Conjugate-13 10/23/2015  . Pneumococcal Polysaccharide-23 07/10/2014    Family History  Problem Relation Age of Onset  . Diabetes Mother   . Heart disease Mother   . Dementia Mother   . Heart disease Father   . High blood pressure Father   . Colon cancer Maternal Uncle        x 2  . Breast cancer Other        great aunts x 5     Current Outpatient Medications:  .  albuterol (PROVENTIL HFA;VENTOLIN HFA) 108 (90 Base) MCG/ACT inhaler, Inhale 2 puffs into the lungs every 6 (six) hours as needed for wheezing or shortness of breath., Disp: 1 Inhaler, Rfl: 2 .  amitriptyline (ELAVIL) 50 MG tablet, Take 2 tablets (100 mg total) by mouth at bedtime., Disp: , Rfl:  .  amLODipine (NORVASC) 5 MG tablet, TAKE 1 TABLET BY MOUTH TWICE A DAY, Disp: 180 tablet, Rfl: 1 .  atorvastatin (LIPITOR) 40 MG tablet, TAKE 1 TABLET BY MOUTH EVERY DAY, Disp: 90 tablet, Rfl: 3 .  donepezil (ARICEPT) 10 MG tablet, Take 1 tablet (10 mg total) at bedtime by mouth., Disp: 90 tablet, Rfl: 3 .  hydrALAZINE (APRESOLINE) 50 MG tablet, Take 1 tablet (50 mg total) by mouth 2 (two) times daily., Disp: 180 tablet, Rfl: 3 .  HYDROmorphone (DILAUDID) 4 MG tablet, Take 1 tablet (4 mg total) by mouth every 8 (eight) hours as needed for moderate pain. (Patient taking differently: Take 4 mg by mouth 3 (three) times daily. ), Disp: , Rfl:  .  insulin glargine (LANTUS) 100 UNIT/ML injection, Inject into the skin as directed., Disp: , Rfl:  .  Insulin NPH Human, Isophane, (NOVOLIN N [place]), Inject into the skin as directed., Disp: ,  Rfl:  .  Insulin Syringe-Needle U-100 (INSULIN SYRINGE 1CC/30GX5/16")  30G X 5/16" 1 ML MISC, Use as directed for insulin injections 5 times daily, Disp: 150 each, Rfl: 1 .  Ipratropium-Albuterol (COMBIVENT RESPIMAT) 20-100 MCG/ACT AERS respimat, Inhale 1 puff into the lungs every 6 (six) hours., Disp: 1 Inhaler, Rfl: 3 .  ipratropium-albuterol (DUONEB) 0.5-2.5 (3) MG/3ML SOLN, Take 3 mLs by nebulization every 6 (six) hours as needed (Wheezing or dyspnea.)., Disp: , Rfl:  .  levothyroxine (SYNTHROID, LEVOTHROID) 175 MCG tablet, TAKE 1 TABLET BY MOUTH SIX DAYS A WEEK BEFORE BREAKFAST, Disp: 90 tablet, Rfl: 0 .  Lidocaine HCl (ASPERCREME LIDOCAINE) 4 % LIQD, Apply 1 application topically daily as needed (pain)., Disp: , Rfl:  .  meloxicam (MOBIC) 7.5 MG tablet, Take 15 mg by mouth daily. , Disp: , Rfl: 5 .  metoprolol tartrate (LOPRESSOR) 25 MG tablet, Take 1 tablet (25 mg total) by mouth 2 (two) times daily., Disp: 180 tablet, Rfl: 3 .  morphine (MS CONTIN) 30 MG 12 hr tablet, Take 30 mg by mouth 2 (two) times daily., Disp: , Rfl:  .  ondansetron (ZOFRAN-ODT) 4 MG disintegrating tablet, Take 1 tablet (4 mg total) by mouth every 6 (six) hours as needed for nausea or vomiting., Disp: 20 tablet, Rfl: 1 .  potassium chloride SA (KLOR-CON M20) 20 MEQ tablet, TAKE 2 TABLETS EVERY DAY AS NEEDED FOR CRAMPING, Disp: 180 tablet, Rfl: 1 .  tiZANidine (ZANAFLEX) 2 MG tablet, Take 2 mg by mouth 3 (three) times daily as needed for muscle spasms. , Disp: , Rfl: 2 .  aspirin EC 81 MG tablet, Take 1 tablet (81 mg total) by mouth daily. (Patient not taking: Reported on 03/17/2018), Disp: 90 tablet, Rfl: 3 .  busPIRone (BUSPAR) 7.5 MG tablet, Take 1 tablet (7.5 mg total) by mouth 3 (three) times daily as needed. (Patient not taking: Reported on 03/17/2018), Disp: 270 tablet, Rfl: 1   Review of Systems     Objective:   Physical Exam  Constitutional: She is oriented to person, place, and time. She appears well-developed and well-nourished. No distress.  Obese   HENT:  Head:  Normocephalic and atraumatic.  Right Ear: External ear normal.  Left Ear: External ear normal.  Mouth/Throat: Oropharynx is clear and moist. No oropharyngeal exudate.  Eyes: Pupils are equal, round, and reactive to light. Conjunctivae and EOM are normal. Right eye exhibits no discharge. Left eye exhibits no discharge. No scleral icterus.  Neck: Normal range of motion. Neck supple. No JVD present. No tracheal deviation present. No thyromegaly present.  Cardiovascular: Normal rate, regular rhythm, normal heart sounds and intact distal pulses. Exam reveals no gallop and no friction rub.  No murmur heard. Pulmonary/Chest: Effort normal and breath sounds normal. No respiratory distress. She has no wheezes. She has no rales. She exhibits no tenderness.  Abdominal: Soft. Bowel sounds are normal. She exhibits no distension and no mass. There is no tenderness. There is no rebound and no guarding.  Musculoskeletal: Normal range of motion. She exhibits edema. She exhibits no tenderness.  Lymphadenopathy:    She has no cervical adenopathy.  Neurological: She is alert and oriented to person, place, and time. She has normal reflexes. No cranial nerve deficit. She exhibits normal muscle tone. Coordination normal.  Skin: Skin is warm and dry. No rash noted. She is not diaphoretic. No erythema. No pallor.  Psychiatric: She has a normal mood and affect. Her behavior is normal. Judgment and thought content normal.  Vitals reviewed.  Today's Vitals   03/17/18 1012  BP: 122/70  Pulse: 79  SpO2: 96%  Weight: 294 lb (133.4 kg)  Height: 5\' 9"  (1.753 m)    Estimated body mass index is 43.42 kg/m as calculated from the following:   Height as of this encounter: 5\' 9"  (1.753 m).   Weight as of this encounter: 294 lb (133.4 kg).     Assessment:       ICD-10-CM   1. Dyspnea on exertion R06.09   2. History of smoking greater than 50 pack years Z87.891   3. Abnormal PFT R94.2   overall suspect dyspnea is due  to obesity, physical deconditioning diastolic dysfunction with her without COPD. We'll focus on the lungs and her workup     Plan:      Glad you are back for followup Too bad symptoms still persist Has been > 2 year ssince we did PFT of CT Underlying sleep apnea can be issue as well  Plan Do HRCT supine and prone Do full PFT Ensure you have followup with Dr Elsworth Soho in sleep medicine  Followup Nurse practitioner or Dr Chase Caller but after completing PFT and CT   Dr. Brand Males, M.D., [facility].C.P Pulmonary and Critical Care Medicine Staff Physician, [place] Director - Interstitial Lung Disease  Program  Pulmonary [place] at [place], [place], [address]  Pager: [phone], If no answer or between  15:00h - 7:00h: call [phone] Telephone: [phone]

## 2018-03-17 NOTE — Patient Instructions (Signed)
ICD-10-CM   1. Dyspnea on exertion R06.09   2. History of smoking greater than 50 pack years Z87.891   3. Abnormal PFT R94.2     Glad you are back for followup Too bad symptoms still persist Has been > 2 year ssince we did PFT of CT Underlying sleep apnea can be issue as well  Plan Do HRCT supine and prone Do full PFT Ensure you have followup with Dr Elsworth Soho in sleep medicine  Followup Nurse practitioner or Dr Chase Caller but after completing PFT and CT

## 2018-03-20 ENCOUNTER — Ambulatory Visit: Payer: Medicare HMO | Admitting: Internal Medicine

## 2018-03-21 DIAGNOSIS — E119 Type 2 diabetes mellitus without complications: Secondary | ICD-10-CM | POA: Diagnosis not present

## 2018-03-21 NOTE — Progress Notes (Signed)
Subjective:    Patient ID: Mary Acosta, female    DOB: 1956/02/22, 62 y.o.   MRN: [id number]  HPI The patient is here for follow up.  Hypertension, chronic diastolic CHF: She is taking her medication daily. She is compliant with a low sodium diet.  She denies chest pain, palpitations, and regular headaches. The other day she was watching TV and she was very lightheaded - her BP was 60/40 at home -- and after an hour it went back up.  She denies other similar episodes and is unsure when the episode occurred.  Nothing has been different that day.  Most of the time her BP is 150/80's.  She is exercising some.      Diabetes: She is following with endocrine.  She is taking her medication daily as prescribed. She is fairly compliant with a diabetic diet. She is exercising - more walking.    Hypothyroidism:  She is following with endocrine.  She is taking her medication daily.  She denies any recent changes in energy or weight that are unexplained.   She stopped talking the aricept because she thinks it was causing the shakes.    Insomnia, OSA, obesity hypoventilation syndrome, chronic respiratory failure:  She often can not sleep more than 3-4 hours some nights.  When she gets completely exhausted she gets into a very deep sleep and she stops breathing and her cpap goes off because it does not detect respirations.  When she does wake up her BP is very high and she is shaking.  She has tried eliminating muscle relaxers and sinus medications.  She is unsure if that has helped.  She does not want to take them if they put her at risk for not breathing.  She has a follow-up with pulmonary next month.     Medications and allergies reviewed with patient and updated if appropriate.  Patient Active Problem List   Diagnosis Date Noted  . Obesity hypoventilation syndrome ([place]) 09/24/2017  . Abnormal urine odor 09/20/2017  . Palpitations 09/18/2017  . Diabetic neuropathy ([place]) 06/04/2017  .  Vitamin B12 deficiency 03/05/2017  . Numbness and tingling in both hands 02/24/2017  . OSA (obstructive sleep apnea) 01/31/2017  . Liver cirrhosis secondary to NASH ([place]) 01/31/2017  . Hypothyroidism 01/31/2017  . Uncontrolled type 2 diabetes mellitus with insulin therapy ([place]) 01/31/2017  . Chronic narcotic use 01/31/2017  . Fibromyalgia 01/31/2017  . Hyperlipidemia 01/31/2017  . Gastroparesis   . Memory disorder 07/25/2016  . Pain in toe 04/09/2016  . Lump in neck 01/29/2016  . Chronic diastolic (congestive) heart failure ([place]) 01/03/2016  . Fatty liver 01/03/2016  . Type 1 diabetes mellitus ([place]) 01/03/2016  . Recurrent Clostridium difficile diarrhea 01/03/2016  . Chronic respiratory failure ([place]) 11/10/2014  . Physical deconditioning 09/26/2014  . Dyspnea 09/26/2014  . Iron deficiency anemia, unspecified  04/05/2011  . Bariatric surgery status 04/05/2011  . Left arm pain   . UNSPECIFIED VITAMIN D DEFICIENCY 10/22/2007  . LOW BACK PAIN, CHRONIC 10/22/2007  . INSOMNIA 10/22/2007  . Obesity 10/14/2007  . Chronic pain syndrome 10/14/2007  . CARPAL TUNNEL SYNDROME 10/14/2007  . PERIPHERAL NEUROPATHY 10/14/2007  . ALLERGIC RHINITIS CAUSE UNSPECIFIED 10/14/2007  . ARTHRITIS 10/14/2007  . Anxiety state 08/25/2007  . Depression 08/25/2007  . Essential hypertension 08/25/2007  . ASTHMA 08/25/2007  . CONSTIPATION 08/25/2007  . POLYMYALGIA RHEUMATICA 08/25/2007  . LEG EDEMA, BILATERAL 08/25/2007    Current Outpatient Medications on File Prior to  Visit  Medication Sig Dispense Refill  . amitriptyline (ELAVIL) 50 MG tablet Take 2 tablets (100 mg total) by mouth at bedtime.    Marland Kitchen amLODipine (NORVASC) 5 MG tablet TAKE 1 TABLET BY MOUTH TWICE A DAY 180 tablet 1  . aspirin EC 81 MG tablet Take 1 tablet (81 mg total) by mouth daily. 90 tablet 3  . atorvastatin (LIPITOR) 40 MG tablet TAKE 1 TABLET BY MOUTH EVERY DAY 90 tablet 3  . busPIRone (BUSPAR) 7.5 MG tablet Take 1 tablet (7.5 mg  total) by mouth 3 (three) times daily as needed. 270 tablet 1  . hydrALAZINE (APRESOLINE) 50 MG tablet Take 1 tablet (50 mg total) by mouth 2 (two) times daily. 180 tablet 3  . HYDROmorphone (DILAUDID) 4 MG tablet Take 1 tablet (4 mg total) by mouth every 8 (eight) hours as needed for moderate pain. (Patient taking differently: Take 4 mg by mouth 3 (three) times daily. )    . insulin glargine (LANTUS) 100 UNIT/ML injection Inject into the skin as directed.    . Insulin NPH Human, Isophane, (NOVOLIN N [place]) Inject into the skin as directed.    . Insulin Syringe-Needle U-100 (INSULIN SYRINGE 1CC/30GX5/16") 30G X 5/16" 1 ML MISC Use as directed for insulin injections 5 times daily 150 each 1  . ipratropium-albuterol (DUONEB) 0.5-2.5 (3) MG/3ML SOLN Take 3 mLs by nebulization every 6 (six) hours as needed (Wheezing or dyspnea.).    Marland Kitchen levothyroxine (SYNTHROID, LEVOTHROID) 175 MCG tablet TAKE 1 TABLET BY MOUTH SIX DAYS A WEEK BEFORE BREAKFAST 90 tablet 0  . Lidocaine HCl (ASPERCREME LIDOCAINE) 4 % LIQD Apply 1 application topically daily as needed (pain).    . meloxicam (MOBIC) 7.5 MG tablet Take 15 mg by mouth daily.   5  . metoprolol tartrate (LOPRESSOR) 25 MG tablet Take 1 tablet (25 mg total) by mouth 2 (two) times daily. 180 tablet 3  . morphine (MS CONTIN) 30 MG 12 hr tablet Take 30 mg by mouth 2 (two) times daily.    . ondansetron (ZOFRAN-ODT) 4 MG disintegrating tablet Take 1 tablet (4 mg total) by mouth every 6 (six) hours as needed for nausea or vomiting. 20 tablet 1  . potassium chloride SA (KLOR-CON M20) 20 MEQ tablet TAKE 2 TABLETS EVERY DAY AS NEEDED FOR CRAMPING 180 tablet 1  . tiZANidine (ZANAFLEX) 2 MG tablet Take 2 mg by mouth 3 (three) times daily as needed for muscle spasms.   2   No current facility-administered medications on file prior to visit.     Past Medical History:  Diagnosis Date  . AKI (acute kidney injury) ([place]) 01/2017  . Anxiety    with panic attacks  . Arthritis     "back; feet; hands; shoulders" (08/26/2014)  . Asthma   . Cervical cancer ([place])   . Chronic lower back pain   . Chronic narcotic use   . Chronic pain syndrome    [facility]  . Cirrhosis ([place])   . Clostridium difficile infection 2017  . COPD (chronic obstructive pulmonary disease) ([place])   . Daily headache   . Depression   . Diabetic neuropathy ([place]) 06/04/2017  . DJD (degenerative joint disease)   . Fatty liver disease, nonalcoholic   . Fibromyalgia   . Frequency of urination   . HCAP (healthcare-associated pneumonia) 08/26/2014  . History of TIA (transient ischemic attack) 11-01-2010   NO RESIDUAL  . Hyperlipidemia   . Hypertension   . Hypothyroidism   .  IDDM (insulin dependent diabetes mellitus) ([place])   . Insomnia   . Lumbar stenosis   . Memory difficulty 07/25/2016  . Nocturia   . OSA (obstructive sleep apnea)    NO CPAP SINCE WT LOSS  . Osteoarthritis    with severe disease in knee  . Pneumonia "several times"  . Polymyalgia rheumatica ([place])   . Scoliosis   . Seasonal allergies   . Thyroid cancer ([place])   . Urgency of urination   . Vaginal pain S/P SLING  FEB 2012    Past Surgical History:  Procedure Laterality Date  . APPENDECTOMY  1982  . BREAST EXCISIONAL BIOPSY Left 02/28/2005   Atypical Ductal Hyperplasia  . CARDIAC CATHETERIZATION  09-04-2004   NORMAL CORONARY ANATOMY/ NORMAL LVF/ EF 60%  . CARDIOVASCULAR STRESS TEST  12-27-2010  DR [place]   ABNORMAL NUCLEAR STUDY W/ /MILD INFERIOR ISCHEMIA/ EF 69%/  CT HEART ANGIOGRAM ;  NO ACUTE FINDINGS  . CRYOABLATION  05/16/2003   w/LEEP FOR ABNORMAL PAP SMEAR  . CYSTOSCOPY  05/18/2012   Procedure: CYSTOSCOPY;  Surgeon: Reece Packer, MD;  [facility];  Service: Urology;  Laterality: N/A;  examination under anethesia  . ESOPHAGOGASTRODUODENOSCOPY (EGD) WITH PROPOFOL N/A 09/03/2016   Procedure: ESOPHAGOGASTRODUODENOSCOPY (EGD) WITH PROPOFOL;  Surgeon: Doran Stabler, MD;   Location: [facility];  Service: Gastroenterology;  Laterality: N/A;  . HYSTEROSCOPY W/D&C  08-19-2007   PMB  . KNEE ARTHROSCOPY W/ DEBRIDEMENT Left 03/29/2006   INTERNAL DERANGEMENT/ SEVERE DJD/ MENISCUS TEARS  . LAPAROSCOPIC CHOLECYSTECTOMY  06-10-2002  . LAPAROSCOPIC GASTRIC BANDING  03/01/2006   TRUNCAL VAGOTOMY/ PLACEMENT OF VG BAND  . REVISION TOTAL KNEE ARTHROPLASTY Left 08-29-2008; 05/2009  . TONSILLECTOMY  1969  . TOTAL KNEE ARTHROPLASTY Left 01-23-2007   SEVERE DJD  . TOTAL THYROIDECTOMY  11-22-2005   BILATERAL THYROID NODULES-- PAPILLARY CARCINOMA (0.5CM)/ ADENOMATOID NODULES  . TRANSTHORACIC ECHOCARDIOGRAM  12-27-2010   LVSF NORMAL / EF 09-32%/ GRADE I DIASTOLIC DYSFUNCTION/ MILD MITRAL REGURG. / MILDLY DILATED LEFT ATRIUM/ MILDY INCREASED SYSTOLIC PRESSURE OF PULMONARY ARTERIES  . TRANSVAGINAL SUBURETERAL TAPE/ SLING  09-28-2010   MIXED URINARY INCONTINENCE  . TUBAL LIGATION  1983    Social History   Socioeconomic History  . Marital status: Married    Spouse name: Not on file  . Number of children: 2  . Years of education: 24  . Highest education level: Not on file  Occupational History  . Occupation: disabled    Fish farm manager: UNEMPLOYED  Social Needs  . Financial resource strain: Not on file  . Food insecurity:    Worry: Not on file    Inability: Not on file  . Transportation needs:    Medical: Not on file    Non-medical: Not on file  Tobacco Use  . Smoking status: Former Smoker    Packs/day: 2.50    Years: 39.00    Pack years: 97.50    Types: Cigarettes    Last attempt to quit: 10/22/2010    Years since quitting: 7.4  . Smokeless tobacco: Never Used  Substance and Sexual Activity  . Alcohol use: No    Alcohol/week: 0.0 oz  . Drug use: No  . Sexual activity: Not Currently  Lifestyle  . Physical activity:    Days per week: Not on file    Minutes per session: Not on file  . Stress: Not on file  Relationships  . Social connections:    Talks on phone:  Not  on file    Gets together: Not on file    Attends religious service: Not on file    Active member of club or organization: Not on file    Attends meetings of clubs or organizations: Not on file    Relationship status: Not on file  Other Topics Concern  . Not on file  Social History Narrative   Lives at home w/ her husband and grandson   Right-handed   Caffeine: 1 cup of coffee per week + [employer]    Family History  Problem Relation Age of Onset  . Diabetes Mother   . Heart disease Mother   . Dementia Mother   . Heart disease Father   . High blood pressure Father   . Colon cancer Maternal Uncle        x 2  . Breast cancer Other        great aunts x 5    Review of Systems  Constitutional: Negative for chills and fever.  Respiratory: Positive for shortness of breath (with exertion or talking long periods). Negative for cough and wheezing.   Cardiovascular: Positive for leg swelling. Negative for chest pain and palpitations.  Neurological: Positive for dizziness and light-headedness. Negative for headaches.       Objective:   Vitals:   03/23/18 1056  BP: (!) 150/86  Pulse: 70  Resp: 16  Temp: 98.5 F (36.9 C)  SpO2: 93%   BP Readings from Last 3 Encounters:  03/23/18 (!) 150/86  03/17/18 122/70  03/10/18 132/68   Wt Readings from Last 3 Encounters:  03/23/18 294 lb (133.4 kg)  03/17/18 294 lb (133.4 kg)  03/10/18 291 lb (132 kg)   Body mass index is 43.42 kg/m.   Physical Exam    Constitutional: Appears well-developed and well-nourished. No distress.  HENT:  Head: Normocephalic and atraumatic.  Neck: Neck supple. No tracheal deviation present. No thyromegaly present.  No cervical lymphadenopathy Cardiovascular: Normal rate, regular rhythm and normal heart sounds.   No murmur heard. No carotid bruit .  Mild b/l LE edema with chronic venous stasis skin changes Pulmonary/Chest: Effort normal and breath sounds normal. No respiratory distress. No has no  wheezes. No rales.  Skin: Skin is warm and dry. Not diaphoretic.  Psychiatric: Normal mood and affect. Behavior is normal.      Assessment & Plan:    Obstructive sleep apnea, obesity hypoventilation syndrome, respiratory failure-chronic: Following with pulmonary and has a follow-up appointment next month Continue CPAP use nightly Working on weight loss-stressed the importance of further weight loss  See Problem List for Assessment and Plan of chronic medical problems.

## 2018-03-21 NOTE — Patient Instructions (Addendum)
  Medications reviewed and updated.  No changes recommended at this time.  Your prescription(s) have been submitted to your pharmacy. Please take as directed and contact our office if you believe you are having problem(s) with the medication(s).    Please followup in 6 months   

## 2018-03-23 ENCOUNTER — Ambulatory Visit (INDEPENDENT_AMBULATORY_CARE_PROVIDER_SITE_OTHER): Payer: Medicare HMO | Admitting: Internal Medicine

## 2018-03-23 ENCOUNTER — Encounter

## 2018-03-23 ENCOUNTER — Encounter: Payer: Self-pay | Admitting: Internal Medicine

## 2018-03-23 VITALS — BP 150/86 | HR 70 | Temp 98.5°F | Resp 16 | Wt 294.0 lb

## 2018-03-23 DIAGNOSIS — I10 Essential (primary) hypertension: Secondary | ICD-10-CM

## 2018-03-23 DIAGNOSIS — E1042 Type 1 diabetes mellitus with diabetic polyneuropathy: Secondary | ICD-10-CM | POA: Diagnosis not present

## 2018-03-23 DIAGNOSIS — E039 Hypothyroidism, unspecified: Secondary | ICD-10-CM | POA: Diagnosis not present

## 2018-03-23 DIAGNOSIS — I5032 Chronic diastolic (congestive) heart failure: Secondary | ICD-10-CM | POA: Diagnosis not present

## 2018-03-23 MED ORDER — ALBUTEROL SULFATE HFA 108 (90 BASE) MCG/ACT IN AERS: 2.0000 | INHALATION_SPRAY | Freq: Four times a day (QID) | RESPIRATORY_TRACT | 11 refills | Status: DC | PRN

## 2018-03-23 NOTE — Assessment & Plan Note (Signed)
Blood pressure overall seems to be controlled-slightly elevated here today, but the 2 prior measurements were very good.  She did have one episode at home of hypotension No adjustments to medication today She will continue to monitor at home Continue regular exercise-increase of possible and work on weight loss

## 2018-03-23 NOTE — Assessment & Plan Note (Signed)
Following with endocrine Recent TFTs in normal range Continue current medication dose

## 2018-03-23 NOTE — Assessment & Plan Note (Signed)
Euvolemic on exam No change of medications

## 2018-03-23 NOTE — Assessment & Plan Note (Signed)
Management per Dr. Loanne Drilling Stressed continuing to increase her exercise-walking Stressed the importance of weight loss

## 2018-03-24 ENCOUNTER — Ambulatory Visit (INDEPENDENT_AMBULATORY_CARE_PROVIDER_SITE_OTHER)
Admission: RE | Admit: 2018-03-24 | Discharge: 2018-03-24 | Disposition: A | Discharge status: Home / Self Care | Payer: Medicare HMO | Source: Ambulatory Visit | Attending: Internal Medicine | Admitting: Internal Medicine

## 2018-03-24 ENCOUNTER — Other Ambulatory Visit: Payer: Self-pay | Admitting: Internal Medicine

## 2018-03-24 DIAGNOSIS — J449 Chronic obstructive pulmonary disease, unspecified: Secondary | ICD-10-CM | POA: Diagnosis not present

## 2018-03-24 DIAGNOSIS — J9611 Chronic respiratory failure with hypoxia: Secondary | ICD-10-CM | POA: Diagnosis not present

## 2018-03-24 DIAGNOSIS — J849 Interstitial pulmonary disease, unspecified: Secondary | ICD-10-CM

## 2018-03-24 DIAGNOSIS — E662 Morbid (severe) obesity with alveolar hypoventilation: Secondary | ICD-10-CM | POA: Diagnosis not present

## 2018-03-24 DIAGNOSIS — G4733 Obstructive sleep apnea (adult) (pediatric): Secondary | ICD-10-CM | POA: Diagnosis not present

## 2018-03-24 DIAGNOSIS — R0602 Shortness of breath: Secondary | ICD-10-CM | POA: Diagnosis not present

## 2018-03-24 NOTE — Addendum Note (Signed)
Addended by: Shirlee More R on: 03/24/2018 12:19 PM   Modules accepted: Orders

## 2018-03-25 ENCOUNTER — Other Ambulatory Visit: Payer: Medicare HMO

## 2018-03-25 ENCOUNTER — Telehealth: Payer: Self-pay | Admitting: Internal Medicine

## 2018-03-25 NOTE — Telephone Encounter (Signed)
Raquel Sarna  My last note said get HRCT but I see a cxr resulted. Please clarify who ordered CXR 03/25/2018   Thanks  Dr. Brand Males, M.D., F.C.C.P Pulmonary and Critical Care Medicine Staff Physician, [place] Director - Interstitial Lung Disease  Program  [facility] [place], [place], [address]  Pager: [phone], If no answer or between  15:00h - 7:00h: call [phone] Telephone: [phone]

## 2018-03-25 NOTE — Telephone Encounter (Signed)
Pt's insurance was needing a peer to peer review and when [name] looked at the peer to peer paperwork to see if anything was saying it needed to be done prior to a HRCT, it said pt needed to have had a recent cxr.  That was the reason why the cxr was done. Once you result the cxr, pt can then have the pre-cert done to see if insurance will now approve for her to have the HRCT.

## 2018-03-27 ENCOUNTER — Encounter: Payer: Self-pay | Admitting: Gastroenterology

## 2018-03-30 ENCOUNTER — Ambulatory Visit (INDEPENDENT_AMBULATORY_CARE_PROVIDER_SITE_OTHER)
Admission: RE | Admit: 2018-03-30 | Discharge: 2018-03-30 | Disposition: A | Discharge status: Home / Self Care | Payer: Medicare HMO | Source: Ambulatory Visit | Attending: Internal Medicine | Admitting: Internal Medicine

## 2018-03-30 DIAGNOSIS — R0602 Shortness of breath: Secondary | ICD-10-CM

## 2018-03-30 DIAGNOSIS — R0600 Dyspnea, unspecified: Secondary | ICD-10-CM

## 2018-03-30 DIAGNOSIS — Z87891 Personal history of nicotine dependence: Secondary | ICD-10-CM

## 2018-03-30 DIAGNOSIS — R942 Abnormal results of pulmonary function studies: Secondary | ICD-10-CM | POA: Diagnosis not present

## 2018-03-30 DIAGNOSIS — R0609 Other forms of dyspnea: Secondary | ICD-10-CM | POA: Diagnosis not present

## 2018-03-30 DIAGNOSIS — J984 Other disorders of lung: Secondary | ICD-10-CM | POA: Diagnosis not present

## 2018-04-01 NOTE — Progress Notes (Signed)
@Patient  ID: Mary Acosta, female    DOB: 06-Aug-1956, 62 y.o.   MRN: [id number]  Chief Complaint  Patient presents with  . Follow-up    PFT, CT results, and CXR.     Referring provider: Binnie Rail, MD  HPI: 62 year old female patient followed in our office for obstructive sleep apnea (managed on CPAP), restrictive lung disease PMH: Chronic pain syndrome anxiety Patient of Dr. Chase Caller for pulmonary. Patient of Dr. Elsworth Soho for sleep medicine.  Former smoker.  Quit in 2012.  97.5-pack-year history.  Recent [facility] Encounters:   03/17/18-office visit-Ramaswamy Patient having continued dyspnea on exertion. Plan: Do high-res CT supine and prone, to full PFT, follow-up with Dr. Elsworth Soho for sleep medicine   Tests:  03/30/2018-CT chest high-res- no findings of interstitial lung disease, moderate air trapping indicative of small airways disease, dilated pulmonic trunk concerning for [facility] 10/07/2017-split-night sleep study-AHI 27, min Sa02 85, time below 88 -17.7 min 04/02/2018-pulmonary function test-FVC 74, ratio 82, DLCO 60, moderate restriction moderate diffusion defect 09/24/2016-echocardiogram-LV ejection fraction 50 to 55%, mild LVH, grade 1 diastolic dysfunction, PAP 22 mmHg      04/02/18 OV  63 year old female patient seen today for office visit.  Patient has recently completed pulmonary function testing in our office.  Patient would like to review those results as well as high-res CT results obtained since last office visit.  Pulmonary function test today showing restriction with an FVC of 74, ratio 82, FEV1 79, DLCO 60.  When compared to 2016 PFT showing a reduction in FVC and stable DLCO at 60.  Patient reports that she is been having difficulty using her CPAP.  Saying that she is struggling to sleep regularly.  CPAP compliance report showing 28 of the last 30 days use, with 16 of those days greater than 4 hours.  Average usage days 4 hours and 12 minutes.  APAP  8-13 median pressure 7.5, maximum pressure 7.8, AHI is 5.7 which is improved from AHI of 27.  Patient is having significant leaks.  Patient feels that she is not breathing well with the CPAP.  Patient also reports that she has been having significant anxiety when using her CPAP.  She is unable to get anxiety medications as she is a chronic pain management.  Pain management will either give her her chronic pain meds or her anxiety medications.     Allergies  Allergen Reactions  . Gabapentin Swelling    Swelling in legs Swelling in legs Swelling in legs  . Losartan Other (See Comments)    Myalgias and muscle cramping Other reaction(s): Other (See Comments) Myalgias and muscle cramping Myalgias and muscle cramping Other reaction(s): Other (See Comments) Myalgias and muscle cramping  . Oxycodone Itching  . Sulfa Antibiotics Nausea Only and Rash  . Sulfonamide Derivatives Nausea Only    Immunization History  Administered Date(s) Administered  . Hep A / Hep B 02/05/2017  . Hepatitis B, adult 03/05/2017, 08/14/2017  . Influenza,inj,Quad PF,6+ Mos 05/27/2015, 05/30/2016, 06/13/2017  . Influenza-Unspecified 05/26/2014  . Pneumococcal Conjugate-13 10/23/2015  . Pneumococcal Polysaccharide-23 07/10/2014    Past Medical History:  Diagnosis Date  . AKI (acute kidney injury) ([place]) 01/2017  . Anxiety    with panic attacks  . Arthritis    "back; feet; hands; shoulders" (08/26/2014)  . Asthma   . Cervical cancer ([place])   . Chronic lower back pain   . Chronic narcotic use   . Chronic pain syndrome    PAIN CLINIC  AT [place]  . Cirrhosis ([place])   . Clostridium difficile infection 2017  . COPD (chronic obstructive pulmonary disease) ([place])   . Daily headache   . Depression   . Diabetic neuropathy ([place]) 06/04/2017  . DJD (degenerative joint disease)   . Fatty liver disease, nonalcoholic   . Fibromyalgia   . Frequency of urination   . HCAP (healthcare-associated pneumonia) 08/26/2014    . History of TIA (transient ischemic attack) 11-01-2010   NO RESIDUAL  . Hyperlipidemia   . Hypertension   . Hypothyroidism   . IDDM (insulin dependent diabetes mellitus) ([place])   . Insomnia   . Lumbar stenosis   . Memory difficulty 07/25/2016  . Nocturia   . OSA (obstructive sleep apnea)    NO CPAP SINCE WT LOSS  . Osteoarthritis    with severe disease in knee  . Pneumonia "several times"  . Polymyalgia rheumatica ([place])   . Scoliosis   . Seasonal allergies   . Thyroid cancer ([place])   . Urgency of urination   . Vaginal pain S/P SLING  FEB 2012    Tobacco History: Social History   Tobacco Use  Smoking Status Former Smoker  . Packs/day: 2.50  . Years: 39.00  . Pack years: 97.50  . Types: Cigarettes  . Last attempt to quit: 10/22/2010  . Years since quitting: 7.4  Smokeless Tobacco Never Used   Counseling given: Not Answered Continue not smoking   Outpatient Encounter Medications as of 04/02/2018  Medication Sig  . albuterol (PROVENTIL HFA;VENTOLIN HFA) 108 (90 Base) MCG/ACT inhaler Inhale 2 puffs into the lungs every 6 (six) hours as needed for wheezing or shortness of breath.  Marland Kitchen amitriptyline (ELAVIL) 50 MG tablet Take 2 tablets (100 mg total) by mouth at bedtime.  Marland Kitchen amLODipine (NORVASC) 5 MG tablet TAKE 1 TABLET BY MOUTH TWICE A DAY  . aspirin EC 81 MG tablet Take 1 tablet (81 mg total) by mouth daily.  Marland Kitchen atorvastatin (LIPITOR) 40 MG tablet TAKE 1 TABLET BY MOUTH EVERY DAY  . busPIRone (BUSPAR) 7.5 MG tablet Take 1 tablet (7.5 mg total) by mouth 3 (three) times daily as needed.  . hydrALAZINE (APRESOLINE) 50 MG tablet Take 1 tablet (50 mg total) by mouth 2 (two) times daily.  Marland Kitchen HYDROmorphone (DILAUDID) 4 MG tablet Take 1 tablet (4 mg total) by mouth every 8 (eight) hours as needed for moderate pain. (Patient taking differently: Take 4 mg by mouth 3 (three) times daily. )  . insulin glargine (LANTUS) 100 UNIT/ML injection Inject into the skin as directed.  . Insulin NPH  Human, Isophane, (NOVOLIN N [place]) Inject into the skin as directed.  . Insulin Syringe-Needle U-100 (INSULIN SYRINGE 1CC/30GX5/16") 30G X 5/16" 1 ML MISC Use as directed for insulin injections 5 times daily  . ipratropium-albuterol (DUONEB) 0.5-2.5 (3) MG/3ML SOLN Take 3 mLs by nebulization every 6 (six) hours as needed (Wheezing or dyspnea.).  Marland Kitchen levothyroxine (SYNTHROID, LEVOTHROID) 175 MCG tablet TAKE 1 TABLET BY MOUTH SIX DAYS A WEEK BEFORE BREAKFAST  . Lidocaine HCl (ASPERCREME LIDOCAINE) 4 % LIQD Apply 1 application topically daily as needed (pain).  . meloxicam (MOBIC) 7.5 MG tablet Take 15 mg by mouth daily.   . metoprolol tartrate (LOPRESSOR) 25 MG tablet Take 1 tablet (25 mg total) by mouth 2 (two) times daily.  Marland Kitchen morphine (MS CONTIN) 30 MG 12 hr tablet Take 30 mg by mouth 2 (two) times daily.  . ondansetron (ZOFRAN-ODT) 4 MG disintegrating tablet Take  1 tablet (4 mg total) by mouth every 6 (six) hours as needed for nausea or vomiting.  . potassium chloride SA (KLOR-CON M20) 20 MEQ tablet TAKE 2 TABLETS EVERY DAY AS NEEDED FOR CRAMPING  . tiZANidine (ZANAFLEX) 2 MG tablet Take 2 mg by mouth 3 (three) times daily as needed for muscle spasms.    No facility-administered encounter medications on file as of 04/02/2018.      Review of Systems  Review of Systems  Constitutional: Positive for activity change, fatigue and unexpected weight change (wt increased). Negative for chills and fever.  HENT: Negative for congestion and sneezing.   Respiratory: Positive for chest tightness and shortness of breath. Negative for wheezing.   Cardiovascular: Positive for leg swelling (bilateral). Negative for chest pain and palpitations.  Gastrointestinal: Negative for blood in stool, diarrhea, nausea and vomiting.  Genitourinary: Negative for dysuria, frequency and urgency.  Musculoskeletal: Positive for arthralgias and back pain.  Skin: Negative for color change.  Allergic/Immunologic: Negative for  environmental allergies and food allergies.  Neurological: Positive for headaches (occasionally in the morning ). Negative for dizziness and light-headedness.  Psychiatric/Behavioral: Negative for dysphoric mood. The patient is nervous/anxious.       Physical Exam  BP 126/80   Pulse 66   Ht 5' 8.5" (1.74 m)   Wt (!) 305 lb (138.3 kg)   SpO2 95%   BMI 45.70 kg/m   Wt Readings from Last 5 Encounters:  04/02/18 (!) 305 lb (138.3 kg)  03/23/18 294 lb (133.4 kg)  03/17/18 294 lb (133.4 kg)  03/10/18 291 lb (132 kg)  02/24/18 267 lb 6.4 oz (121.3 kg)     Physical Exam  Constitutional: She is oriented to person, place, and time and well-developed, well-nourished, and in no distress. No distress.  HENT:  Head: Normocephalic and atraumatic.  Right Ear: Hearing, tympanic membrane, external ear and ear canal normal.  Left Ear: Hearing, tympanic membrane, external ear and ear canal normal.  Mouth/Throat: Uvula is midline and oropharynx is clear and moist. No oropharyngeal exudate.  mallampati III   Eyes: Pupils are equal, round, and reactive to light.  Neck: Normal range of motion. Neck supple. No JVD present.  Cardiovascular: Normal rate, regular rhythm and normal heart sounds.  Pulmonary/Chest: Effort normal and breath sounds normal. No accessory muscle usage. No respiratory distress. She has no decreased breath sounds. She has no wheezes. She has no rhonchi.  Musculoskeletal: Normal range of motion. She exhibits edema (1+ le edema bilaterally ).  Lymphadenopathy:    She has no cervical adenopathy.  Neurological: She is alert and oriented to person, place, and time. Gait normal.  Skin: Skin is warm and dry. She is not diaphoretic. There is erythema (lower extremity bilaterally ).  Psychiatric: Mood, memory, affect and judgment normal.  Nursing note and vitals reviewed.    04/02/18 - FENO: 11 04/02/18 - walk in office -patient was able to complete 2 laps with walking office, oxygen  saturations did not drop below 94%, patient was significantly winded and decompensated when doing the walk.  Patient also reporting she is been having bilateral leg pain when she walks   Lab Results:  CBC    Component Value Date/Time   WBC 14.3 (H) 12/08/2017 1948   RBC 5.43 (H) 12/08/2017 1948   HGB 13.2 12/08/2017 1948   HCT 43.3 12/08/2017 1948   PLT 188 12/08/2017 1948   MCV 79.7 12/08/2017 1948   MCH 24.3 (L) 12/08/2017 1948   MCHC  30.5 12/08/2017 1948   RDW 14.6 12/08/2017 1948   LYMPHSABS 1.8 09/19/2017 1418   MONOABS 0.4 09/19/2017 1418   EOSABS 0.2 09/19/2017 1418   BASOSABS 0.1 09/19/2017 1418    BMET    Component Value Date/Time   NA 134 (L) 12/08/2017 1948   K 4.2 12/08/2017 1948   CL 96 (L) 12/08/2017 1948   CO2 26 12/08/2017 1948   GLUCOSE 374 (H) 12/08/2017 1948   BUN 22 (H) 12/08/2017 1948   CREATININE 1.02 (H) 12/08/2017 1948   CALCIUM 8.6 (L) 12/08/2017 1948   GFRNONAA 58 (L) 12/08/2017 1948   GFRAA >60 12/08/2017 1948    BNP    Component Value Date/Time   BNP 16.8 01/31/2017 1028    ProBNP No results found for: PROBNP  Imaging: Dg Chest 2 View  Result Date: 03/24/2018 CLINICAL DATA:  Patient with history of shortness of breath. EXAM: CHEST - 2 VIEW COMPARISON:  Chest radiograph 12/08/2017. FINDINGS: Stable cardiac and mediastinal contours. Bibasilar atelectasis. No pleural effusion or pneumothorax. Thoracic spine degenerative changes. IMPRESSION: No acute cardiopulmonary process. Electronically Signed   By: Lovey Newcomer M.D.   On: 03/24/2018 17:11   Ct Chest High Resolution  Result Date: 03/30/2018 CLINICAL DATA:  62 year old female with history of shortness of breath. History of COPD. Quit smoking 4 years ago. EXAM: CT CHEST WITHOUT CONTRAST TECHNIQUE: Multidetector CT imaging of the chest was performed following the standard protocol without intravenous contrast. High resolution imaging of the lungs, as well as inspiratory and expiratory  imaging, was performed. COMPARISON:  Chest CT 08/05/2016. FINDINGS: Cardiovascular: Heart size is normal. There is no significant pericardial fluid, thickening or pericardial calcification. Aortic atherosclerosis. No definite coronary artery calcifications. Pulmonic trunk is dilated measuring 3.7 cm in diameter. Mediastinum/Nodes: No pathologically enlarged mediastinal or hilar lymph nodes. Please note that accurate exclusion of hilar adenopathy is limited on noncontrast CT scans. Esophagus is unremarkable in appearance. No axillary lymphadenopathy. Lungs/Pleura: High-resolution images demonstrate no significant regions of ground-glass attenuation, subpleural reticulation, parenchymal banding, traction bronchiectasis or frank honeycombing. Inspiratory and expiratory imaging demonstrates moderate air trapping indicative of small airways disease. No acute consolidative airspace disease. No pleural effusions. No suspicious appearing pulmonary nodules or masses are noted. Upper Abdomen: Diffuse low attenuation throughout the visualized hepatic parenchyma, indicative of hepatic steatosis. LapBand in position. Musculoskeletal: There are no aggressive appearing lytic or blastic lesions noted in the visualized portions of the skeleton. IMPRESSION: 1. No findings to suggest interstitial lung disease. 2. However, there is moderate air trapping indicative of small airways disease. 3. Dilatation of the pulmonic trunk (3.7 cm in diameter), concerning for associated pulmonary arterial hypertension. 4. Hepatic steatosis. 5. Aortic atherosclerosis. 6. Additional incidental findings, as above. Aortic Atherosclerosis (ICD10-I70.0). Electronically Signed   By: Vinnie Langton M.D.   On: 03/30/2018 19:28     Assessment & Plan:   Pleasant 62 year old patient seen office visit today.  Will order BNP as well as some additional lab work.  We will also order echocardiogram.  Will change CPAP pressures to 5-13 based off of compliance  report.  Feno today was normal at 11.  We were able to coordinate patient to see Dr. Elsworth Soho on 8/13 at 2pm for sleep follow up. Can discuss then with patient if she needs to return to sleep center vs. ONO at home.   Needs flu vaccine when available.   OSA (obstructive sleep apnea)  We will adjust her CPAP pressures to 5-13 >>>1 month download to  check compliance >>>pt could benefit with return to sleep center with cpap to evaluate use and o2 needs   As discussed we will discuss with Dr. Chase Caller Dr. Elsworth Soho potentially having you see 1 of our new sleep doctors here so you can be seen sooner.  We will call you to schedule this appointment.   Keep up the hard work using your device.   Remember:  . Do not drive or operate heavy machinery if tired or drowsy.  . Please notify the supply company and office if you are unable to use your device regularly due to missing supplies or machine being broken.  . Work on maintaining a healthy weight and following your recommended nutrition plan  . Maintain proper daily exercise and movement  . Maintaining proper use of your device can also help improve management of other chronic illnesses such as: Blood pressure, blood sugars, and weight management.   CPAP Cleaning:  Clean weekly, with Dawn soap, and bottle brush.  Set up to air dry.     Chronic respiratory failure ([place]) Needs to present to sleep center for revaluation based on feb/2019 recommendations   Chronic pain syndrome Keep follow up with pain management   Physical deconditioning Cont to work on exercise / diet   Dyspnea We will order a echocardiogram today >>>Go to our patient care coordinators to schedule this  Lab work today this will be completed in her basement  Continue to use her albuterol rescue inhaler as needed for shortness of breath and wheezing  We will adjust her CPAP pressures to 5-13 >>>1 month download to check compliance  As discussed we will discuss with Dr.  Chase Caller Dr. Elsworth Soho potentially having you see 1 of our new sleep doctors here so you can be seen sooner.  We will call you to schedule this appointment.   8 weeks follow up in our office    Chronic diastolic (congestive) heart failure ([place]) BNP today  Echo ordered       Lauraine Rinne, NP 04/02/2018

## 2018-04-02 ENCOUNTER — Encounter: Payer: Self-pay | Admitting: Pulmonary Disease

## 2018-04-02 ENCOUNTER — Other Ambulatory Visit (INDEPENDENT_AMBULATORY_CARE_PROVIDER_SITE_OTHER): Payer: Medicare HMO

## 2018-04-02 ENCOUNTER — Ambulatory Visit (INDEPENDENT_AMBULATORY_CARE_PROVIDER_SITE_OTHER): Payer: Medicare HMO | Admitting: Pulmonary Disease

## 2018-04-02 ENCOUNTER — Ambulatory Visit (INDEPENDENT_AMBULATORY_CARE_PROVIDER_SITE_OTHER): Payer: Medicare HMO | Admitting: Internal Medicine

## 2018-04-02 VITALS — BP 126/80 | HR 66 | Ht 68.5 in | Wt 305.0 lb

## 2018-04-02 DIAGNOSIS — G894 Chronic pain syndrome: Secondary | ICD-10-CM | POA: Diagnosis not present

## 2018-04-02 DIAGNOSIS — I5032 Chronic diastolic (congestive) heart failure: Secondary | ICD-10-CM

## 2018-04-02 DIAGNOSIS — R5381 Other malaise: Secondary | ICD-10-CM | POA: Diagnosis not present

## 2018-04-02 DIAGNOSIS — Z87891 Personal history of nicotine dependence: Secondary | ICD-10-CM | POA: Diagnosis not present

## 2018-04-02 DIAGNOSIS — R0600 Dyspnea, unspecified: Secondary | ICD-10-CM

## 2018-04-02 DIAGNOSIS — R0602 Shortness of breath: Secondary | ICD-10-CM

## 2018-04-02 DIAGNOSIS — J9611 Chronic respiratory failure with hypoxia: Secondary | ICD-10-CM | POA: Diagnosis not present

## 2018-04-02 DIAGNOSIS — R942 Abnormal results of pulmonary function studies: Secondary | ICD-10-CM

## 2018-04-02 DIAGNOSIS — R0609 Other forms of dyspnea: Secondary | ICD-10-CM

## 2018-04-02 DIAGNOSIS — G4733 Obstructive sleep apnea (adult) (pediatric): Secondary | ICD-10-CM

## 2018-04-02 LAB — PULMONARY FUNCTION TEST
DL/VA % PRED: 72 %
DL/VA: 3.85 ml/min/mmHg/L
DLCO unc % pred: 60 %
DLCO unc: 18.37 ml/min/mmHg
FEF 25-75 POST: 1.89 L/s
FEF 25-75 Pre: 2.6 L/sec
FEF2575-%Change-Post: -27 %
FEF2575-%PRED-PRE: 102 %
FEF2575-%Pred-Post: 74 %
FEV1-%Change-Post: -5 %
FEV1-%PRED-PRE: 79 %
FEV1-%Pred-Post: 74 %
FEV1-PRE: 2.34 L
FEV1-Post: 2.21 L
FEV1FVC-%Change-Post: 0 %
FEV1FVC-%PRED-PRE: 104 %
FEV6-%Change-Post: -5 %
FEV6-%PRED-POST: 73 %
FEV6-%Pred-Pre: 77 %
FEV6-POST: 2.7 L
FEV6-Pre: 2.87 L
FEV6FVC-%CHANGE-POST: 0 %
FEV6FVC-%PRED-POST: 104 %
FEV6FVC-%Pred-Pre: 104 %
FVC-%Change-Post: -5 %
FVC-%Pred-Post: 70 %
FVC-%Pred-Pre: 74 %
FVC-Post: 2.7 L
FVC-Pre: 2.87 L
PRE FEV1/FVC RATIO: 81 %
Post FEV1/FVC ratio: 82 %
Post FEV6/FVC ratio: 100 %
Pre FEV6/FVC Ratio: 100 %

## 2018-04-02 LAB — CBC WITH DIFFERENTIAL/PLATELET
Basophils Absolute: 0.1 10*3/uL (ref 0.0–0.1)
Basophils Relative: 1.1 % (ref 0.0–3.0)
Eosinophils Absolute: 0.2 10*3/uL (ref 0.0–0.7)
Eosinophils Relative: 2.7 % (ref 0.0–5.0)
HCT: 38.7 % (ref 36.0–46.0)
Hemoglobin: 12.3 g/dL (ref 12.0–15.0)
LYMPHS ABS: 2.3 10*3/uL (ref 0.7–4.0)
LYMPHS PCT: 27.7 % (ref 12.0–46.0)
MCHC: 31.8 g/dL (ref 30.0–36.0)
MCV: 77.4 fl — ABNORMAL LOW (ref 78.0–100.0)
MONO ABS: 0.5 10*3/uL (ref 0.1–1.0)
Monocytes Relative: 6.1 % (ref 3.0–12.0)
NEUTROS PCT: 62.4 % (ref 43.0–77.0)
Neutro Abs: 5.1 10*3/uL (ref 1.4–7.7)
Platelets: 176 10*3/uL (ref 150.0–400.0)
RBC: 5 Mil/uL (ref 3.87–5.11)
RDW: 16 % — ABNORMAL HIGH (ref 11.5–15.5)
WBC: 8.2 10*3/uL (ref 4.0–10.5)

## 2018-04-02 LAB — BRAIN NATRIURETIC PEPTIDE: PRO B NATRI PEPTIDE: 74 pg/mL (ref 0.0–100.0)

## 2018-04-02 LAB — BASIC METABOLIC PANEL
BUN: 11 mg/dL (ref 6–23)
CHLORIDE: 97 meq/L (ref 96–112)
CO2: 31 meq/L (ref 19–32)
Calcium: 8.9 mg/dL (ref 8.4–10.5)
Creatinine, Ser: 0.84 mg/dL (ref 0.40–1.20)
GFR: 73.07 mL/min (ref >60.00)
Glucose, Bld: 358 mg/dL — ABNORMAL HIGH (ref 70–99)
Potassium: 3.7 mEq/L (ref 3.5–5.1)
Sodium: 134 mEq/L — ABNORMAL LOW (ref 135–145)

## 2018-04-02 LAB — POCT EXHALED NITRIC OXIDE: FeNO level (ppb): 11

## 2018-04-02 NOTE — Assessment & Plan Note (Signed)
Keep follow up with pain management

## 2018-04-02 NOTE — Assessment & Plan Note (Addendum)
  We will adjust her CPAP pressures to 5-13 >>>1 month download to check compliance >>>pt could benefit with return to sleep center with cpap to evaluate use and o2 needs   As discussed we will discuss with Dr. Chase Caller Dr. Elsworth Soho potentially having you see 1 of our new sleep doctors here so you can be seen sooner.  We will call you to schedule this appointment.   Keep up the hard work using your device.   Remember:  . Do not drive or operate heavy machinery if tired or drowsy.  . Please notify the supply company and office if you are unable to use your device regularly due to missing supplies or machine being broken.  . Work on maintaining a healthy weight and following your recommended nutrition plan  . Maintain proper daily exercise and movement  . Maintaining proper use of your device can also help improve management of other chronic illnesses such as: Blood pressure, blood sugars, and weight management.   CPAP Cleaning:  Clean weekly, with Dawn soap, and bottle brush.  Set up to air dry.

## 2018-04-02 NOTE — Assessment & Plan Note (Signed)
Needs to present to sleep center for revaluation based on feb/2019 recommendations

## 2018-04-02 NOTE — Assessment & Plan Note (Signed)
BNP today  Echo ordered

## 2018-04-02 NOTE — Assessment & Plan Note (Signed)
Cont to work on exercise / diet

## 2018-04-02 NOTE — Patient Instructions (Signed)
We will order a echocardiogram today >>>Go to our patient care coordinators to schedule this  Lab work today this will be completed in her basement  Continue to use her albuterol rescue inhaler as needed for shortness of breath and wheezing  We will adjust her CPAP pressures to 5-13 >>>1 month download to check compliance  As discussed we will discuss with Dr. Chase Caller Dr. Elsworth Soho potentially having you see 1 of our new sleep doctors here so you can be seen sooner.  We will call you to schedule this appointment.   8 weeks follow up in our office   Keep follow-up with primary care, keep follow-up with pain management       Please contact the office if your symptoms worsen or you have concerns that you are not improving.   Thank you for choosing [facility] for your healthcare, and for allowing [place] to partner with you on your healthcare journey. I am thankful to be able to provide care to you today.   Wyn Quaker FNP-C

## 2018-04-02 NOTE — Progress Notes (Signed)
Discussed results with patient in office.  Nothing further is needed at this time.  Darlina Mccaughey FNP  

## 2018-04-02 NOTE — Progress Notes (Signed)
PFT done today. 

## 2018-04-02 NOTE — Assessment & Plan Note (Signed)
We will order a echocardiogram today >>>Go to our patient care coordinators to schedule this  Lab work today this will be completed in her basement  Continue to use her albuterol rescue inhaler as needed for shortness of breath and wheezing  We will adjust her CPAP pressures to 5-13 >>>1 month download to check compliance  As discussed we will discuss with Dr. Chase Caller Dr. Elsworth Soho potentially having you see 1 of our new sleep doctors here so you can be seen sooner.  We will call you to schedule this appointment.   8 weeks follow up in our office

## 2018-04-02 NOTE — Progress Notes (Signed)
Your blood work is come back.  Showing no fluid overload.  It is showing persistently elevated blood sugars.  Notify your endocrinologist that her blood sugars today were 358 on your blood work.  Work closely with her endocrinologist to ensure your blood work becomes more controlled.  This is exceptionally high.  Please have close follow-up with endocrinology and primary care regarding your blood sugar results.  We will route your blood work to your primary care as well as to Dr. Loanne Drilling your endocrinologist.  Wyn Quaker FNP

## 2018-04-07 ENCOUNTER — Encounter: Payer: Self-pay | Admitting: Pulmonary Disease

## 2018-04-07 ENCOUNTER — Ambulatory Visit (INDEPENDENT_AMBULATORY_CARE_PROVIDER_SITE_OTHER): Payer: Medicare HMO | Admitting: Pulmonary Disease

## 2018-04-07 DIAGNOSIS — F1190 Opioid use, unspecified, uncomplicated: Secondary | ICD-10-CM | POA: Diagnosis not present

## 2018-04-07 DIAGNOSIS — G4733 Obstructive sleep apnea (adult) (pediatric): Secondary | ICD-10-CM

## 2018-04-07 DIAGNOSIS — R69 Illness, unspecified: Secondary | ICD-10-CM | POA: Diagnosis not present

## 2018-04-07 NOTE — Progress Notes (Signed)
   Subjective:    Patient ID: Mary Acosta, female    DOB: January 30, 1956, 62 y.o.   MRN: [id number]  HPI  62 year old remote smoker for follow-up of moderate OSA She has chronic pain and is on high-dose morphine and Dilaudid for breakthrough.  She has been evaluated for dyspnea on exertion, high-resolution CT did not show any evidence of ILD, previous echo has not shown any evidence of pulmonary hypertension, she sees my partner Dr. Chase Caller for this.  She was started on auto CPAP machine on her last visit with me based on her prior sleep study.  She is able to sleep with this but complains of severe claustrophobia with a full facemask.  She has trialed nasal mask but continues to have a large leak. Download was reviewed which shows excellent control of events with residual AHI average of 6/average usage of 4.5 hours on auto settings 5 to 13 cm   Significant tests/ events reviewed  NPSG 09/2017 AHI 27/h >> CPAP 13 cm  NPSG 10/2014- 291 lbs _AHI 0.4/hour with lowest desaturation of 82% >> started on nocturnal oxygen   PFTs 10/2014-ratio 81, FEV1 82%, FVC 79%, TLC 87% and DLCO 57%  Echo RVSP 22 HRCT 03/2018 no ILD, PA trunk 3.7 cm  Review of Systems Patient denies significant dyspnea,cough, hemoptysis,  chest pain, palpitations, pedal edema, orthopnea, paroxysmal nocturnal dyspnea, lightheadedness, nausea, vomiting, abdominal or  leg pains      Objective:   Physical Exam  Gen. Pleasant, obese, in no distress ENT - no lesions, no post nasal drip Neck: No JVD, no thyromegaly, no carotid bruits Lungs: no use of accessory muscles, no dullness to percussion, decreased without rales or rhonchi  Cardiovascular: Rhythm regular, heart sounds  normal, no murmurs or gallops, no peripheral edema Musculoskeletal: No deformities, no cyanosis or clubbing , no tremors       Assessment & Plan:

## 2018-04-07 NOTE — Assessment & Plan Note (Signed)
She should not be given any hypnotics in addition to her chronic narcotics. Would only recommend melatonin if needed for insomnia

## 2018-04-07 NOTE — Patient Instructions (Signed)
Change to fixed pressure to 10 cm. Call us back if you have any issues.  Okay to trial of melatonin up to 10 mg 2 hours prior to bedtime

## 2018-04-07 NOTE — Assessment & Plan Note (Signed)
Change to fixed pressure to 10 cm. Call us back if you have any issues. If continues to have mask issues ,may need desensitization visit  Weight loss encouraged, compliance with goal of at least 4-6 hrs every night is the expectation. Advised against medications with sedative side effects Cautioned against driving when sleepy - understanding that sleepiness will vary on a day to day basis

## 2018-04-10 ENCOUNTER — Ambulatory Visit (HOSPITAL_COMMUNITY): Payer: Medicare HMO | Attending: Cardiology

## 2018-04-10 ENCOUNTER — Other Ambulatory Visit: Payer: Self-pay

## 2018-04-10 DIAGNOSIS — R0609 Other forms of dyspnea: Secondary | ICD-10-CM | POA: Diagnosis not present

## 2018-04-10 DIAGNOSIS — I119 Hypertensive heart disease without heart failure: Secondary | ICD-10-CM | POA: Insufficient documentation

## 2018-04-10 DIAGNOSIS — G894 Chronic pain syndrome: Secondary | ICD-10-CM | POA: Diagnosis not present

## 2018-04-10 DIAGNOSIS — Z8673 Personal history of transient ischemic attack (TIA), and cerebral infarction without residual deficits: Secondary | ICD-10-CM | POA: Diagnosis not present

## 2018-04-10 DIAGNOSIS — J449 Chronic obstructive pulmonary disease, unspecified: Secondary | ICD-10-CM | POA: Insufficient documentation

## 2018-04-10 DIAGNOSIS — G4733 Obstructive sleep apnea (adult) (pediatric): Secondary | ICD-10-CM | POA: Diagnosis not present

## 2018-04-10 DIAGNOSIS — E785 Hyperlipidemia, unspecified: Secondary | ICD-10-CM | POA: Insufficient documentation

## 2018-04-10 DIAGNOSIS — R0600 Dyspnea, unspecified: Secondary | ICD-10-CM

## 2018-04-10 NOTE — Progress Notes (Signed)
Echocardiogram results of come back showing normal systolic function.  Grade 1 diastolic dysfunction.  Normal size pulmonary artery.  This is good news.  No changes in plan of care.  Keep follow-up appointment in office.  Wyn Quaker, FNP

## 2018-04-11 DIAGNOSIS — J449 Chronic obstructive pulmonary disease, unspecified: Secondary | ICD-10-CM | POA: Diagnosis not present

## 2018-04-20 ENCOUNTER — Encounter: Payer: Self-pay | Admitting: Gastroenterology

## 2018-04-20 ENCOUNTER — Ambulatory Visit (INDEPENDENT_AMBULATORY_CARE_PROVIDER_SITE_OTHER): Payer: Medicare HMO | Admitting: Gastroenterology

## 2018-04-20 ENCOUNTER — Other Ambulatory Visit (INDEPENDENT_AMBULATORY_CARE_PROVIDER_SITE_OTHER): Payer: Medicare HMO

## 2018-04-20 VITALS — BP 100/70 | HR 76 | Ht 68.11 in | Wt 293.0 lb

## 2018-04-20 DIAGNOSIS — E1042 Type 1 diabetes mellitus with diabetic polyneuropathy: Secondary | ICD-10-CM

## 2018-04-20 DIAGNOSIS — R110 Nausea: Secondary | ICD-10-CM | POA: Diagnosis not present

## 2018-04-20 DIAGNOSIS — K7469 Other cirrhosis of liver: Secondary | ICD-10-CM

## 2018-04-20 DIAGNOSIS — K7460 Unspecified cirrhosis of liver: Secondary | ICD-10-CM

## 2018-04-20 LAB — PROTIME-INR
INR: 1.2 ratio — AB (ref 0.8–1.0)
PROTHROMBIN TIME: 14.4 s — AB (ref 9.6–13.1)

## 2018-04-20 LAB — HEPATIC FUNCTION PANEL
ALK PHOS: 115 U/L (ref 39–117)
ALT: 17 U/L (ref 0–35)
AST: 29 U/L (ref 0–37)
Albumin: 4.1 g/dL (ref 3.5–5.2)
BILIRUBIN DIRECT: 0.2 mg/dL (ref 0.0–0.3)
BILIRUBIN TOTAL: 0.6 mg/dL (ref 0.2–1.2)
Total Protein: 7.6 g/dL (ref 6.0–8.3)

## 2018-04-20 MED ORDER — ONDANSETRON 4 MG PO TBDP: 4.0000 mg | ORAL_TABLET | Freq: Four times a day (QID) | ORAL | 3 refills | Status: DC | PRN

## 2018-04-20 NOTE — Patient Instructions (Signed)
If you are age 62 or older, your body mass index should be between 23-30. Your Body mass index is 44.41 kg/m. If this is out of the aforementioned range listed, please consider follow up with your Primary Care Provider.  If you are age 62 or younger, your body mass index should be between 19-25. Your Body mass index is 44.41 kg/m. If this is out of the aformentioned range listed, please consider follow up with your Primary Care Provider.  You have been scheduled for an abdominal ultrasound at [facility] (1st floor of hospital) on 04-28-2018 at [place]. Please arrive 15 minutes prior to your appointment for registration. Make certain not to have anything to eat or drink 6 hours prior to your appointment. Should you need to reschedule your appointment, please contact radiology at [phone]. This test typically takes about 30 minutes to perform.  Your provider has requested that you go to the basement level for lab work before leaving today. Press "B" on the elevator. The lab is located at the first door on the left as you exit the elevator.  It was a pleasure to see you today!  Dr. Loletha Carrow

## 2018-04-20 NOTE — Progress Notes (Signed)
[facility] Progress Note  Chief Complaint: NASH cirrhosis  Subjective  History:  Mary Acosta follows up for her compensated NASH-related cirrhosis.  She continues to have episodic acute onset constipation that is relieved with Zofran.  Prior notes referred to her previous experience with metoclopramide, how she felt that just worsened her symptoms.  She is also had a LAP-BAND, which she feels may be related to the nausea and vomiting if she eats too quickly.  She expresses significant concerns about her long-term health, her memory trouble, orthopedic and mobility issues as well as depression for which she plans to see a new mental health professional soon.  She denies rectal bleeding, hematemesis, increased abdominal girth.  She has variable pedal edema and some right medial knee pain for which she plans to see orthopedics.  ROS: Cardiovascular:  no chest pain Respiratory: Dyspnea with exertion.  Her activity level is minimal. Depression Memory difficulty Right knee pain  Remainder of systems negative except as above  The patient's Past Medical, Family and Social History were reviewed and are on file in the EMR.  Objective:  Med list reviewed  Current Outpatient Medications:  .  albuterol (PROVENTIL HFA;VENTOLIN HFA) 108 (90 Base) MCG/ACT inhaler, Inhale 2 puffs into the lungs every 6 (six) hours as needed for wheezing or shortness of breath., Disp: 1 Inhaler, Rfl: 11 .  amitriptyline (ELAVIL) 50 MG tablet, Take 2 tablets (100 mg total) by mouth at bedtime., Disp: , Rfl:  .  amLODipine (NORVASC) 5 MG tablet, TAKE 1 TABLET BY MOUTH TWICE A DAY, Disp: 180 tablet, Rfl: 1 .  aspirin EC 81 MG tablet, Take 1 tablet (81 mg total) by mouth daily., Disp: 90 tablet, Rfl: 3 .  atorvastatin (LIPITOR) 40 MG tablet, TAKE 1 TABLET BY MOUTH EVERY DAY, Disp: 90 tablet, Rfl: 3 .  hydrALAZINE (APRESOLINE) 50 MG tablet, Take 1 tablet (50 mg total) by mouth 2 (two) times daily., Disp: 180  tablet, Rfl: 3 .  HYDROmorphone (DILAUDID) 4 MG tablet, Take 1 tablet (4 mg total) by mouth every 8 (eight) hours as needed for moderate pain. (Patient taking differently: Take 4 mg by mouth 3 (three) times daily. ), Disp: , Rfl:  .  insulin glargine (LANTUS) 100 UNIT/ML injection, Inject into the skin as directed., Disp: , Rfl:  .  Insulin NPH Human, [place], (NOVOLIN N [place]), Inject into the skin as directed., Disp: , Rfl:  .  Insulin Syringe-Needle U-100 (INSULIN SYRINGE 1CC/30GX5/16") 30G X 5/16" 1 ML MISC, Use as directed for insulin injections 5 times daily, Disp: 150 each, Rfl: 1 .  ipratropium-albuterol (DUONEB) 0.5-2.5 (3) MG/3ML SOLN, Take 3 mLs by nebulization every 6 (six) hours as needed (Wheezing or dyspnea.)., Disp: , Rfl:  .  levothyroxine (SYNTHROID, LEVOTHROID) 175 MCG tablet, TAKE 1 TABLET BY MOUTH SIX DAYS A WEEK BEFORE BREAKFAST, Disp: 90 tablet, Rfl: 0 .  Lidocaine HCl (ASPERCREME LIDOCAINE) 4 % LIQD, Apply 1 application topically as needed (pain). , Disp: , Rfl:  .  meloxicam (MOBIC) 7.5 MG tablet, Take 15 mg by mouth daily. , Disp: , Rfl: 5 .  metoprolol tartrate (LOPRESSOR) 25 MG tablet, Take 1 tablet (25 mg total) by mouth 2 (two) times daily., Disp: 180 tablet, Rfl: 3 .  morphine (MS CONTIN) 30 MG 12 hr tablet, Take 30 mg by mouth 2 (two) times daily., Disp: , Rfl:  .  tiZANidine (ZANAFLEX) 2 MG tablet, Take 2 mg by mouth as needed for muscle spasms. ,  Disp: , Rfl: 2 .  ondansetron (ZOFRAN-ODT) 4 MG disintegrating tablet, Take 1 tablet (4 mg total) by mouth every 6 (six) hours as needed for nausea or vomiting., Disp: 30 tablet, Rfl: 3 .  potassium chloride SA (KLOR-CON M20) 20 MEQ tablet, TAKE 2 TABLETS EVERY DAY AS NEEDED FOR CRAMPING (Patient not taking: Reported on 04/20/2018), Disp: 180 tablet, Rfl: 1   Vital signs in last 24 hrs: Vitals:   04/20/18 1347  BP: 100/70  Pulse: 76    Physical Exam  Chronically ill-appearing woman, no acute distress  HEENT: sclera  anicteric, oral mucosa dry and without lesions  Neck: supple, no thyromegaly, JVD or lymphadenopathy  Cardiac: RRR without murmurs, S1S2 heard, no peripheral edema.  Venous stasis changes  Pulm: clear to auscultation bilaterally, normal RR and effort noted  Abdomen: soft, no tenderness, with active bowel sounds. No guarding or palpable hepatosplenomegaly, limited by body habitus.  Skin; warm and dry, no jaundice or rash  Recent Labs:  CBC Latest Ref Rng & Units 04/02/2018 12/08/2017 09/19/2017  WBC 4.0 - 10.5 K/uL 8.2 14.3(H) 7.4  Hemoglobin 12.0 - 15.0 g/dL 12.3 13.2 12.7  Hematocrit 36.0 - 46.0 % 38.7 43.3 40.1  Platelets 150.0 - 400.0 K/uL 176.0 188 173.0   CMP Latest Ref Rng & Units 04/20/2018 04/02/2018 12/08/2017  Glucose 70 - 99 mg/dL - 358(H) 374(H)  BUN 6 - 23 mg/dL - 11 22(H)  Creatinine 0.40 - 1.20 mg/dL - 0.84 1.02(H)  Sodium 135 - 145 mEq/L - 134(L) 134(L)  Potassium 3.5 - 5.1 mEq/L - 3.7 4.2  Chloride 96 - 112 mEq/L - 97 96(L)  CO2 19 - 32 mEq/L - 31 26  Calcium 8.4 - 10.5 mg/dL - 8.9 8.6(L)  Total Protein 6.0 - 8.3 g/dL 7.6 - -  Total Bilirubin 0.2 - 1.2 mg/dL 0.6 - -  Alkaline Phos 39 - 117 U/L 115 - -  AST 0 - 37 U/L 29 - -  ALT 0 - 35 U/L 17 - -   INR 1.3 10/08/17  Radiologic studies:  Hepatic ultrasound 10/08/2017: 8 mm left lobe cyst, no liver mass seen.  Echogenic hepatic parenchyma texture consistent with fatty liver/cirrhosis  @ASSESSMENTPLANBEGIN @ Assessment: Encounter Diagnoses  Name Primary?  . Cirrhosis of liver without ascites, unspecified hepatic cirrhosis type ([place]) Yes  . Nausea without vomiting   . Type 1 diabetes mellitus with diabetic polyneuropathy ([place])   . Other cirrhosis of liver ([place])    Her cirrhosis has been stable and compensated.  She has multiple other complicated health issues.   Plan: Updated hepatoma screening with AFP and ultrasound, also hepatic function panel and INR to update meld score.  Zofran prescribed for as needed  use.   Total time 30 minutes, over half spent face-to-face with patient in counseling and coordination of care.   [name]

## 2018-04-21 LAB — AFP TUMOR MARKER: AFP TUMOR MARKER: 2.6 ng/mL

## 2018-04-24 DIAGNOSIS — E662 Morbid (severe) obesity with alveolar hypoventilation: Secondary | ICD-10-CM | POA: Diagnosis not present

## 2018-04-24 DIAGNOSIS — G4733 Obstructive sleep apnea (adult) (pediatric): Secondary | ICD-10-CM | POA: Diagnosis not present

## 2018-04-24 DIAGNOSIS — J449 Chronic obstructive pulmonary disease, unspecified: Secondary | ICD-10-CM | POA: Diagnosis not present

## 2018-04-24 DIAGNOSIS — J9611 Chronic respiratory failure with hypoxia: Secondary | ICD-10-CM | POA: Diagnosis not present

## 2018-04-28 ENCOUNTER — Telehealth: Payer: Self-pay

## 2018-04-28 ENCOUNTER — Ambulatory Visit (HOSPITAL_COMMUNITY): Admission: RE | Admit: 2018-04-28 | Payer: Medicare HMO | Source: Ambulatory Visit

## 2018-04-28 NOTE — Telephone Encounter (Signed)
Spoke with pt to clarify what labs she is wanting done. She stated she was having back pain and wanted her kidneys checked. Advised she should come in to see Dr. Quay Burow. Appt made for Friday. Pt aware.

## 2018-04-28 NOTE — Telephone Encounter (Signed)
Copied from [place] [phone]. Topic: General - Other >> Apr 24, 2018  1:51 PM Judyann Munson wrote: Reason for CRM: Patient is request lab work done. Please advise

## 2018-04-30 ENCOUNTER — Encounter: Payer: Self-pay | Admitting: Endocrinology

## 2018-04-30 ENCOUNTER — Ambulatory Visit (INDEPENDENT_AMBULATORY_CARE_PROVIDER_SITE_OTHER): Payer: Medicare HMO | Admitting: Endocrinology

## 2018-04-30 VITALS — BP 124/82 | HR 70 | Ht 68.0 in | Wt 303.2 lb

## 2018-04-30 DIAGNOSIS — E1165 Type 2 diabetes mellitus with hyperglycemia: Secondary | ICD-10-CM

## 2018-04-30 DIAGNOSIS — Z794 Long term (current) use of insulin: Secondary | ICD-10-CM

## 2018-04-30 DIAGNOSIS — Z9884 Bariatric surgery status: Secondary | ICD-10-CM | POA: Diagnosis not present

## 2018-04-30 DIAGNOSIS — C73 Malignant neoplasm of thyroid gland: Secondary | ICD-10-CM | POA: Diagnosis not present

## 2018-04-30 DIAGNOSIS — IMO0002 Reserved for concepts with insufficient information to code with codable children: Secondary | ICD-10-CM

## 2018-04-30 LAB — POCT GLYCOSYLATED HEMOGLOBIN (HGB A1C): HEMOGLOBIN A1C: 9.8 % — AB (ref 4.0–5.6)

## 2018-04-30 MED ORDER — INSULIN NPH (HUMAN) (ISOPHANE) 100 UNIT/ML ~~LOC~~ SUSP: 170.0000 [IU] | SUBCUTANEOUS | 11 refills | Status: DC

## 2018-04-30 NOTE — Patient Instructions (Addendum)
Please go back to see the weight loss surgery specialist.  you will receive a phone call, about a day and time for an appointment  Please increase the NPH insulin to 170 units each morning.   check your blood sugar twice a day.  vary the time of day when you check, between before the 3 meals, and at bedtime.  also check if you have symptoms of your blood sugar being too high or too low.  please keep a record of the readings and bring it to your next appointment here (or you can bring the meter itself).  You can write it on any piece of paper.  please call us sooner if your blood sugar goes below 70, or if you have a lot of readings over 200. Please come back for a follow-up appointment in 2 months.

## 2018-04-30 NOTE — Progress Notes (Deleted)
Subjective:    Patient ID: Mary Acosta, female    DOB: 1955-12-15, 62 y.o.   MRN: [id number]  HPI The patient is here for an acute visit.   Back pain, ? Kidneys:   Medications and allergies reviewed with patient and updated if appropriate.  Patient Active Problem List   Diagnosis Date Noted  . Thyroid cancer ([place]) 04/30/2018  . Obesity hypoventilation syndrome ([place]) 09/24/2017  . Palpitations 09/18/2017  . Diabetic neuropathy ([place]) 06/04/2017  . Vitamin B12 deficiency 03/05/2017  . Numbness and tingling in both hands 02/24/2017  . OSA (obstructive sleep apnea) 01/31/2017  . Liver cirrhosis secondary to NASH ([place]) 01/31/2017  . Hypothyroidism 01/31/2017  . Chronic narcotic use 01/31/2017  . Fibromyalgia 01/31/2017  . Hyperlipidemia 01/31/2017  . Gastroparesis   . Memory disorder 07/25/2016  . Pain in toe 04/09/2016  . Lump in neck 01/29/2016  . Chronic diastolic (congestive) heart failure ([place]) 01/03/2016  . Fatty liver 01/03/2016  . Type 1 diabetes mellitus ([place]) 01/03/2016  . Recurrent Clostridium difficile diarrhea 01/03/2016  . Chronic respiratory failure ([place]) 11/10/2014  . Physical deconditioning 09/26/2014  . Dyspnea 09/26/2014  . Iron deficiency anemia, unspecified  04/05/2011  . Bariatric surgery status 04/05/2011  . Left arm pain   . UNSPECIFIED VITAMIN D DEFICIENCY 10/22/2007  . LOW BACK PAIN, CHRONIC 10/22/2007  . INSOMNIA 10/22/2007  . Obesity 10/14/2007  . Chronic pain syndrome 10/14/2007  . CARPAL TUNNEL SYNDROME 10/14/2007  . PERIPHERAL NEUROPATHY 10/14/2007  . ALLERGIC RHINITIS CAUSE UNSPECIFIED 10/14/2007  . ARTHRITIS 10/14/2007  . Anxiety state 08/25/2007  . Depression 08/25/2007  . Essential hypertension 08/25/2007  . ASTHMA 08/25/2007  . CONSTIPATION 08/25/2007  . POLYMYALGIA RHEUMATICA 08/25/2007  . LEG EDEMA, BILATERAL 08/25/2007    Current Outpatient Medications on File Prior to Visit  Medication Sig Dispense Refill  .  albuterol (PROVENTIL HFA;VENTOLIN HFA) 108 (90 Base) MCG/ACT inhaler Inhale 2 puffs into the lungs every 6 (six) hours as needed for wheezing or shortness of breath. 1 Inhaler 11  . amitriptyline (ELAVIL) 100 MG tablet TAKE 1 TABLET BY MOUTH EVERY DAY AT NIGHT  3  . amLODipine (NORVASC) 5 MG tablet TAKE 1 TABLET BY MOUTH TWICE A DAY 180 tablet 1  . aspirin EC 81 MG tablet Take 1 tablet (81 mg total) by mouth daily. 90 tablet 3  . atorvastatin (LIPITOR) 40 MG tablet TAKE 1 TABLET BY MOUTH EVERY DAY 90 tablet 3  . busPIRone (BUSPAR) 7.5 MG tablet TAKE 1 TABLET (7.5 MG TOTAL) BY MOUTH 3 (THREE) TIMES DAILY AS NEEDED.  1  . hydrALAZINE (APRESOLINE) 50 MG tablet Take 1 tablet (50 mg total) by mouth 2 (two) times daily. 180 tablet 3  . HYDROmorphone (DILAUDID) 4 MG tablet Take 1 tablet (4 mg total) by mouth every 8 (eight) hours as needed for moderate pain. (Patient taking differently: Take 4 mg by mouth 3 (three) times daily. )    . insulin NPH Human (NOVOLIN N [place]) 100 UNIT/ML injection Inject 1.7 mLs (170 Units total) into the skin every morning. And syringes 1/day 60 mL 11  . Insulin Syringe-Needle U-100 (INSULIN SYRINGE 1CC/30GX5/16") 30G X 5/16" 1 ML MISC Use as directed for insulin injections 5 times daily 150 each 1  . ipratropium-albuterol (DUONEB) 0.5-2.5 (3) MG/3ML SOLN Take 3 mLs by nebulization every 6 (six) hours as needed (Wheezing or dyspnea.).    Marland Kitchen levothyroxine (SYNTHROID, LEVOTHROID) 175 MCG tablet TAKE 1 TABLET BY MOUTH SIX  DAYS A WEEK BEFORE BREAKFAST 90 tablet 0  . Lidocaine HCl (ASPERCREME LIDOCAINE) 4 % LIQD Apply 1 application topically as needed (pain).     . meloxicam (MOBIC) 7.5 MG tablet Take 15 mg by mouth daily.   5  . metoprolol tartrate (LOPRESSOR) 25 MG tablet Take 1 tablet (25 mg total) by mouth 2 (two) times daily. 180 tablet 3  . morphine (MS CONTIN) 30 MG 12 hr tablet Take 30 mg by mouth 2 (two) times daily.    . ondansetron (ZOFRAN-ODT) 4 MG disintegrating tablet  Take 1 tablet (4 mg total) by mouth every 6 (six) hours as needed for nausea or vomiting. 30 tablet 3  . potassium chloride SA (KLOR-CON M20) 20 MEQ tablet TAKE 2 TABLETS EVERY DAY AS NEEDED FOR CRAMPING 180 tablet 1  . tiZANidine (ZANAFLEX) 2 MG tablet Take 2 mg by mouth as needed for muscle spasms.   2   No current facility-administered medications on file prior to visit.     Past Medical History:  Diagnosis Date  . AKI (acute kidney injury) ([place]) 01/2017  . Anxiety    with panic attacks  . Arthritis    "back; feet; hands; shoulders" (08/26/2014)  . Asthma   . Cervical cancer ([place])   . Chronic lower back pain   . Chronic narcotic use   . Chronic pain syndrome    [facility]  . Cirrhosis ([place])   . Clostridium difficile infection 2017  . COPD (chronic obstructive pulmonary disease) ([place])   . Daily headache   . Depression   . Diabetic neuropathy ([place]) 06/04/2017  . DJD (degenerative joint disease)   . Fatty liver disease, nonalcoholic   . Fibromyalgia   . Frequency of urination   . HCAP (healthcare-associated pneumonia) 08/26/2014  . History of TIA (transient ischemic attack) 11-01-2010   NO RESIDUAL  . Hyperlipidemia   . Hypertension   . Hypothyroidism   . IDDM (insulin dependent diabetes mellitus) ([place])   . Insomnia   . Lumbar stenosis   . Memory difficulty 07/25/2016  . Nocturia   . OSA (obstructive sleep apnea)    NO CPAP SINCE WT LOSS  . Osteoarthritis    with severe disease in knee  . Pneumonia "several times"  . Polymyalgia rheumatica ([place])   . Scoliosis   . Seasonal allergies   . Thyroid cancer ([place])   . Urgency of urination   . Vaginal pain S/P SLING  FEB 2012    Past Surgical History:  Procedure Laterality Date  . APPENDECTOMY  1982  . BREAST EXCISIONAL BIOPSY Left 02/28/2005   Atypical Ductal Hyperplasia  . CARDIAC CATHETERIZATION  09-04-2004   NORMAL CORONARY ANATOMY/ NORMAL LVF/ EF 60%  . CARDIOVASCULAR STRESS TEST  12-27-2010  DR  [place]   ABNORMAL NUCLEAR STUDY W/ /MILD INFERIOR ISCHEMIA/ EF 69%/  CT HEART ANGIOGRAM ;  NO ACUTE FINDINGS  . CRYOABLATION  05/16/2003   w/LEEP FOR ABNORMAL PAP SMEAR  . CYSTOSCOPY  05/18/2012   Procedure: CYSTOSCOPY;  Surgeon: Reece Packer, MD;  Location: [facility];  Service: Urology;  Laterality: N/A;  examination under anethesia  . ESOPHAGOGASTRODUODENOSCOPY (EGD) WITH PROPOFOL N/A 09/03/2016   Procedure: ESOPHAGOGASTRODUODENOSCOPY (EGD) WITH PROPOFOL;  Surgeon: Doran Stabler, MD;  Location: [facility];  Service: Gastroenterology;  Laterality: N/A;  . HYSTEROSCOPY W/D&C  08-19-2007   PMB  . KNEE ARTHROSCOPY W/ DEBRIDEMENT Left 03/29/2006   INTERNAL DERANGEMENT/ SEVERE DJD/ MENISCUS TEARS  .  LAPAROSCOPIC CHOLECYSTECTOMY  06-10-2002  . LAPAROSCOPIC GASTRIC BANDING  03/01/2006   TRUNCAL VAGOTOMY/ PLACEMENT OF VG BAND  . REVISION TOTAL KNEE ARTHROPLASTY Left 08-29-2008; 05/2009  . TONSILLECTOMY  1969  . TOTAL KNEE ARTHROPLASTY Left 01-23-2007   SEVERE DJD  . TOTAL THYROIDECTOMY  11-22-2005   BILATERAL THYROID NODULES-- PAPILLARY CARCINOMA (0.5CM)/ ADENOMATOID NODULES  . TRANSTHORACIC ECHOCARDIOGRAM  12-27-2010   LVSF NORMAL / EF 24-58%/ GRADE I DIASTOLIC DYSFUNCTION/ MILD MITRAL REGURG. / MILDLY DILATED LEFT ATRIUM/ MILDY INCREASED SYSTOLIC PRESSURE OF PULMONARY ARTERIES  . TRANSVAGINAL SUBURETERAL TAPE/ SLING  09-28-2010   MIXED URINARY INCONTINENCE  . TUBAL LIGATION  1983    Social History   Socioeconomic History  . Marital status: Married    Spouse name: Not on file  . Number of children: 2  . Years of education: 15  . Highest education level: Not on file  Occupational History  . Occupation: disabled    Fish farm manager: UNEMPLOYED  Social Needs  . Financial resource strain: Not on file  . Food insecurity:    Worry: Not on file    Inability: Not on file  . Transportation needs:    Medical: Not on file    Non-medical: Not on file  Tobacco Use  .  Smoking status: Former Smoker    Packs/day: 2.50    Years: 39.00    Pack years: 97.50    Types: Cigarettes    Last attempt to quit: 10/22/2010    Years since quitting: 7.5  . Smokeless tobacco: Never Used  Substance and Sexual Activity  . Alcohol use: No    Alcohol/week: 0.0 standard drinks  . Drug use: No  . Sexual activity: Not Currently  Lifestyle  . Physical activity:    Days per week: Not on file    Minutes per session: Not on file  . Stress: Not on file  Relationships  . Social connections:    Talks on phone: Not on file    Gets together: Not on file    Attends religious service: Not on file    Active member of club or organization: Not on file    Attends meetings of clubs or organizations: Not on file    Relationship status: Not on file  Other Topics Concern  . Not on file  Social History Narrative   Lives at home w/ her husband and grandson   Right-handed   Caffeine: 1 cup of coffee per week + [employer]    Family History  Problem Relation Age of Onset  . Diabetes Mother   . Heart disease Mother   . Dementia Mother   . Heart disease Father   . High blood pressure Father   . Colon cancer Maternal Uncle        x 2  . Breast cancer Other        great aunts x 5    Review of Systems     Objective:  There were no vitals filed for this visit. BP Readings from Last 3 Encounters:  04/30/18 124/82  04/20/18 100/70  04/07/18 130/70   Wt Readings from Last 3 Encounters:  04/30/18 (!) 303 lb 3.2 oz (137.5 kg)  04/20/18 293 lb (132.9 kg)  04/07/18 (!) 301 lb 9.6 oz (136.8 kg)   There is no height or weight on file to calculate BMI.   Physical Exam         Assessment & Plan:    See Problem List for Assessment and Plan of  chronic medical problems.

## 2018-04-30 NOTE — Progress Notes (Signed)
Subjective:    Patient ID: Mary Acosta, female    DOB: 03/10/1956, 62 y.o.   MRN: [id number]  HPI Pt returns for f/u of diabetes mellitus: DM type: 1 Dx'ed: 6578 Complications: polyneuropathy, gastroparesis, and retinopathy.  Therapy: insulin since 2011 GDM: never DKA: once, in 2015 Severe hypoglycemia: never Pancreatitis: never Other: she had gastric banding in 2008.  She then lost 148 lbs, but has since regained 106 of that; she takes human insulin, due to cost; she takes qd insulin, due to missing multiple daily injections   Interval history: no cbg record, but states cbg's vary from 160-260.  She says she never misses the insulin.  pt states she feels well in general, except for chronic pain.   Past Medical History:  Diagnosis Date  . AKI (acute kidney injury) ([place]) 01/2017  . Anxiety    with panic attacks  . Arthritis    "back; feet; hands; shoulders" (08/26/2014)  . Asthma   . Cervical cancer ([place])   . Chronic lower back pain   . Chronic narcotic use   . Chronic pain syndrome    [facility]  . Cirrhosis ([place])   . Clostridium difficile infection 2017  . COPD (chronic obstructive pulmonary disease) ([place])   . Daily headache   . Depression   . Diabetic neuropathy ([place]) 06/04/2017  . DJD (degenerative joint disease)   . Fatty liver disease, nonalcoholic   . Fibromyalgia   . Frequency of urination   . HCAP (healthcare-associated pneumonia) 08/26/2014  . History of TIA (transient ischemic attack) 11-01-2010   NO RESIDUAL  . Hyperlipidemia   . Hypertension   . Hypothyroidism   . IDDM (insulin dependent diabetes mellitus) ([place])   . Insomnia   . Lumbar stenosis   . Memory difficulty 07/25/2016  . Nocturia   . OSA (obstructive sleep apnea)    NO CPAP SINCE WT LOSS  . Osteoarthritis    with severe disease in knee  . Pneumonia "several times"  . Polymyalgia rheumatica ([place])   . Scoliosis   . Seasonal allergies   . Thyroid cancer ([place])   .  Urgency of urination   . Vaginal pain S/P SLING  FEB 2012    Past Surgical History:  Procedure Laterality Date  . APPENDECTOMY  1982  . BREAST EXCISIONAL BIOPSY Left 02/28/2005   Atypical Ductal Hyperplasia  . CARDIAC CATHETERIZATION  09-04-2004   NORMAL CORONARY ANATOMY/ NORMAL LVF/ EF 60%  . CARDIOVASCULAR STRESS TEST  12-27-2010  DR [place]   ABNORMAL NUCLEAR STUDY W/ /MILD INFERIOR ISCHEMIA/ EF 69%/  CT HEART ANGIOGRAM ;  NO ACUTE FINDINGS  . CRYOABLATION  05/16/2003   w/LEEP FOR ABNORMAL PAP SMEAR  . CYSTOSCOPY  05/18/2012   Procedure: CYSTOSCOPY;  Surgeon: Reece Packer, MD;  Location: [facility];  Service: Urology;  Laterality: N/A;  examination under anethesia  . ESOPHAGOGASTRODUODENOSCOPY (EGD) WITH PROPOFOL N/A 09/03/2016   Procedure: ESOPHAGOGASTRODUODENOSCOPY (EGD) WITH PROPOFOL;  Surgeon: Doran Stabler, MD;  Location: [facility];  Service: Gastroenterology;  Laterality: N/A;  . HYSTEROSCOPY W/D&C  08-19-2007   PMB  . KNEE ARTHROSCOPY W/ DEBRIDEMENT Left 03/29/2006   INTERNAL DERANGEMENT/ SEVERE DJD/ MENISCUS TEARS  . LAPAROSCOPIC CHOLECYSTECTOMY  06-10-2002  . LAPAROSCOPIC GASTRIC BANDING  03/01/2006   TRUNCAL VAGOTOMY/ PLACEMENT OF VG BAND  . REVISION TOTAL KNEE ARTHROPLASTY Left 08-29-2008; 05/2009  . TONSILLECTOMY  1969  . TOTAL KNEE ARTHROPLASTY Left 01-23-2007  SEVERE DJD  . TOTAL THYROIDECTOMY  11-22-2005   BILATERAL THYROID NODULES-- PAPILLARY CARCINOMA (0.5CM)/ ADENOMATOID NODULES  . TRANSTHORACIC ECHOCARDIOGRAM  12-27-2010   LVSF NORMAL / EF 02-54%/ GRADE I DIASTOLIC DYSFUNCTION/ MILD MITRAL REGURG. / MILDLY DILATED LEFT ATRIUM/ MILDY INCREASED SYSTOLIC PRESSURE OF PULMONARY ARTERIES  . TRANSVAGINAL SUBURETERAL TAPE/ SLING  09-28-2010   MIXED URINARY INCONTINENCE  . TUBAL LIGATION  1983    Social History   Socioeconomic History  . Marital status: Married    Spouse name: Not on file  . Number of children: 2  . Years of education:  18  . Highest education level: Not on file  Occupational History  . Occupation: disabled    Fish farm manager: UNEMPLOYED  Social Needs  . Financial resource strain: Not on file  . Food insecurity:    Worry: Not on file    Inability: Not on file  . Transportation needs:    Medical: Not on file    Non-medical: Not on file  Tobacco Use  . Smoking status: Former Smoker    Packs/day: 2.50    Years: 39.00    Pack years: 97.50    Types: Cigarettes    Last attempt to quit: 10/22/2010    Years since quitting: 7.5  . Smokeless tobacco: Never Used  Substance and Sexual Activity  . Alcohol use: No    Alcohol/week: 0.0 standard drinks  . Drug use: No  . Sexual activity: Not Currently  Lifestyle  . Physical activity:    Days per week: Not on file    Minutes per session: Not on file  . Stress: Not on file  Relationships  . Social connections:    Talks on phone: Not on file    Gets together: Not on file    Attends religious service: Not on file    Active member of club or organization: Not on file    Attends meetings of clubs or organizations: Not on file    Relationship status: Not on file  . Intimate partner violence:    Fear of current or ex partner: Not on file    Emotionally abused: Not on file    Physically abused: Not on file    Forced sexual activity: Not on file  Other Topics Concern  . Not on file  Social History Narrative   Lives at home w/ her husband and grandson   Right-handed   Caffeine: 1 cup of coffee per week + [employer]    Current Outpatient Medications on File Prior to Visit  Medication Sig Dispense Refill  . albuterol (PROVENTIL HFA;VENTOLIN HFA) 108 (90 Base) MCG/ACT inhaler Inhale 2 puffs into the lungs every 6 (six) hours as needed for wheezing or shortness of breath. 1 Inhaler 11  . amitriptyline (ELAVIL) 100 MG tablet TAKE 1 TABLET BY MOUTH EVERY DAY AT NIGHT  3  . amLODipine (NORVASC) 5 MG tablet TAKE 1 TABLET BY MOUTH TWICE A DAY 180 tablet 1  . aspirin EC 81  MG tablet Take 1 tablet (81 mg total) by mouth daily. 90 tablet 3  . atorvastatin (LIPITOR) 40 MG tablet TAKE 1 TABLET BY MOUTH EVERY DAY 90 tablet 3  . hydrALAZINE (APRESOLINE) 50 MG tablet Take 1 tablet (50 mg total) by mouth 2 (two) times daily. 180 tablet 3  . HYDROmorphone (DILAUDID) 4 MG tablet Take 1 tablet (4 mg total) by mouth every 8 (eight) hours as needed for moderate pain. (Patient taking differently: Take 4 mg by mouth 3 (three)  times daily. )    . Insulin Syringe-Needle U-100 (INSULIN SYRINGE 1CC/30GX5/16") 30G X 5/16" 1 ML MISC Use as directed for insulin injections 5 times daily 150 each 1  . ipratropium-albuterol (DUONEB) 0.5-2.5 (3) MG/3ML SOLN Take 3 mLs by nebulization every 6 (six) hours as needed (Wheezing or dyspnea.).    Marland Kitchen levothyroxine (SYNTHROID, LEVOTHROID) 175 MCG tablet TAKE 1 TABLET BY MOUTH SIX DAYS A WEEK BEFORE BREAKFAST 90 tablet 0  . Lidocaine HCl (ASPERCREME LIDOCAINE) 4 % LIQD Apply 1 application topically as needed (pain).     . meloxicam (MOBIC) 7.5 MG tablet Take 15 mg by mouth daily.   5  . metoprolol tartrate (LOPRESSOR) 25 MG tablet Take 1 tablet (25 mg total) by mouth 2 (two) times daily. 180 tablet 3  . morphine (MS CONTIN) 30 MG 12 hr tablet Take 30 mg by mouth 2 (two) times daily.    . ondansetron (ZOFRAN-ODT) 4 MG disintegrating tablet Take 1 tablet (4 mg total) by mouth every 6 (six) hours as needed for nausea or vomiting. 30 tablet 3  . potassium chloride SA (KLOR-CON M20) 20 MEQ tablet TAKE 2 TABLETS EVERY DAY AS NEEDED FOR CRAMPING 180 tablet 1  . tiZANidine (ZANAFLEX) 2 MG tablet Take 2 mg by mouth as needed for muscle spasms.   2  . busPIRone (BUSPAR) 7.5 MG tablet TAKE 1 TABLET (7.5 MG TOTAL) BY MOUTH 3 (THREE) TIMES DAILY AS NEEDED.  1   No current facility-administered medications on file prior to visit.     Allergies  Allergen Reactions  . Gabapentin Swelling    Swelling in legs Swelling in legs Swelling in legs  . Losartan Other  (See Comments)    Myalgias and muscle cramping Other reaction(s): Other (See Comments) Myalgias and muscle cramping Myalgias and muscle cramping Other reaction(s): Other (See Comments) Myalgias and muscle cramping  . Oxycodone Itching  . Sulfa Antibiotics Nausea Only and Rash  . Sulfonamide Derivatives Nausea Only    Family History  Problem Relation Age of Onset  . Diabetes Mother   . Heart disease Mother   . Dementia Mother   . Heart disease Father   . High blood pressure Father   . Colon cancer Maternal Uncle        x 2  . Breast cancer Other        great aunts x 5    BP 124/82 (BP Location: Left Arm, Patient Position: Sitting)   Pulse 70   Ht 5\' 8"  (1.727 m)   Wt (!) 303 lb 3.2 oz (137.5 kg)   SpO2 93%   BMI 46.10 kg/m    Review of Systems She denies hypoglycemia    Objective:   Physical Exam VITAL SIGNS:  See vs page GENERAL: no distress.  Morbid obesity Pulses: dorsalis pedis intact bilat.   MSK: no deformity of the feet, except for mild bilat hammer toes  CV: trace bilat leg edema, bilat vv's, and patchy hyperpigmentation of the legs.   Skin:  no ulcer on the feet.  normal color and temp on the feet. Neuro: sensation is intact to touch on the feet, but decreased from normal.   Ext: There is bilateral onychomycosis of the toenails.    Lab Results  Component Value Date   HGBA1C 9.8 (A) 04/30/2018       Assessment & Plan:  type 1 DM, with DR: she needs increased rx.  Obesity: worse.     Patient Instructions  Please go  back to see the weight loss surgery specialist.  you will receive a phone call, about a day and time for an appointment  Please increase the NPH insulin to 170 units each morning.   check your blood sugar twice a day.  vary the time of day when you check, between before the 3 meals, and at bedtime.  also check if you have symptoms of your blood sugar being too high or too low.  please keep a record of the readings and bring it to your  next appointment here (or you can bring the meter itself).  You can write it on any piece of paper.  please call us sooner if your blood sugar goes below 70, or if you have a lot of readings over 200. Please come back for a follow-up appointment in 2 months.

## 2018-05-01 ENCOUNTER — Ambulatory Visit: Payer: Medicare HMO | Admitting: Internal Medicine

## 2018-05-01 ENCOUNTER — Other Ambulatory Visit (HOSPITAL_COMMUNITY): Payer: Medicare HMO

## 2018-05-01 ENCOUNTER — Ambulatory Visit (HOSPITAL_COMMUNITY): Payer: Medicare HMO

## 2018-05-01 DIAGNOSIS — M5137 Other intervertebral disc degeneration, lumbosacral region: Secondary | ICD-10-CM | POA: Diagnosis not present

## 2018-05-01 DIAGNOSIS — M533 Sacrococcygeal disorders, not elsewhere classified: Secondary | ICD-10-CM | POA: Diagnosis not present

## 2018-05-01 DIAGNOSIS — G894 Chronic pain syndrome: Secondary | ICD-10-CM | POA: Diagnosis not present

## 2018-05-01 DIAGNOSIS — Z5181 Encounter for therapeutic drug level monitoring: Secondary | ICD-10-CM | POA: Diagnosis not present

## 2018-05-01 DIAGNOSIS — Z0289 Encounter for other administrative examinations: Secondary | ICD-10-CM

## 2018-05-04 ENCOUNTER — Ambulatory Visit (HOSPITAL_COMMUNITY)
Admission: RE | Admit: 2018-05-04 | Discharge: 2018-05-04 | Disposition: A | Discharge status: Home / Self Care | Payer: Medicare HMO | Source: Ambulatory Visit | Attending: Gastroenterology | Admitting: Gastroenterology

## 2018-05-04 DIAGNOSIS — E1042 Type 1 diabetes mellitus with diabetic polyneuropathy: Secondary | ICD-10-CM | POA: Insufficient documentation

## 2018-05-04 DIAGNOSIS — K7460 Unspecified cirrhosis of liver: Secondary | ICD-10-CM | POA: Diagnosis not present

## 2018-05-04 DIAGNOSIS — R110 Nausea: Secondary | ICD-10-CM

## 2018-05-04 DIAGNOSIS — K7469 Other cirrhosis of liver: Secondary | ICD-10-CM

## 2018-05-04 DIAGNOSIS — Z9049 Acquired absence of other specified parts of digestive tract: Secondary | ICD-10-CM | POA: Insufficient documentation

## 2018-05-05 ENCOUNTER — Ambulatory Visit (INDEPENDENT_AMBULATORY_CARE_PROVIDER_SITE_OTHER): Payer: Medicare HMO | Admitting: *Deleted

## 2018-05-05 ENCOUNTER — Ambulatory Visit: Payer: Medicare HMO | Admitting: *Deleted

## 2018-05-05 ENCOUNTER — Telehealth: Payer: Self-pay | Admitting: Cardiology

## 2018-05-05 DIAGNOSIS — Z23 Encounter for immunization: Secondary | ICD-10-CM

## 2018-05-05 DIAGNOSIS — Z79899 Other long term (current) drug therapy: Secondary | ICD-10-CM

## 2018-05-05 DIAGNOSIS — R600 Localized edema: Secondary | ICD-10-CM

## 2018-05-05 MED ORDER — CYANOCOBALAMIN 1000 MCG/ML IJ SOLN: 1000.0000 ug | Freq: Once | INTRAMUSCULAR | Status: DC

## 2018-05-05 MED ORDER — FUROSEMIDE 20 MG PO TABS: 20.0000 mg | ORAL_TABLET | Freq: Every day | ORAL | 3 refills | Status: DC

## 2018-05-05 NOTE — Telephone Encounter (Signed)
Returned call to patient who states she has started experiencing LE edema.  She states when she sits down her feet and legs become very swollen to the point it is painful.  She states her weight has increased gradually over time.    Last OV with Dr. Stanford Breed 291 lbs, OV with endocrinology 9/5 303 lbs.  She denies increase in SOB, no orthopnea, no abdominal distention.  The swelling decreases overnight but comes back quickly when she gets down.  She has not had increased salt intake, does not wear compression stockings but does have some.   Does not take a diuretic.   Advised to monitor salt intake, elevate LE, apply compression stockings if able to find them and would route to Dr. Stanford Breed for further recommendations.  Patient aware and verbalized understanding.

## 2018-05-05 NOTE — Telephone Encounter (Signed)
New message  Pt c/o swelling: STAT is pt has developed SOB within 24 hours  1) How much weight have you gained and in what time span?10 lbs over a month and half  2) If swelling, where is the swelling located? Both feet and legs  3) Are you currently taking a fluid pill? No   4) Are you currently SOB? yes  5) Do you have a log of your daily weights (if so, list)? No   6) Have you gained 3 pounds in a day or 5 pounds in a week? yes  7) Have you traveled recently? No

## 2018-05-05 NOTE — Telephone Encounter (Signed)
Lasix 20 mg daily; bmet one week [place]  

## 2018-05-05 NOTE — Telephone Encounter (Signed)
Patient aware and verbalized understanding.  rx sent to pharmacy and lab order placed.

## 2018-05-06 DIAGNOSIS — M1711 Unilateral primary osteoarthritis, right knee: Secondary | ICD-10-CM | POA: Diagnosis not present

## 2018-05-06 DIAGNOSIS — M25561 Pain in right knee: Secondary | ICD-10-CM | POA: Diagnosis not present

## 2018-05-11 NOTE — Progress Notes (Deleted)
Subjective:    Patient ID: Mary Acosta, female    DOB: 07-Jan-1956, 62 y.o.   MRN: [id number]  HPI The patient is here for an acute visit.  B12 deficiency:    Medications and allergies reviewed with patient and updated if appropriate.  Patient Active Problem List   Diagnosis Date Noted  . Thyroid cancer ([place]) 04/30/2018  . Obesity hypoventilation syndrome ([place]) 09/24/2017  . Palpitations 09/18/2017  . Diabetic neuropathy ([place]) 06/04/2017  . Vitamin B12 deficiency 03/05/2017  . Numbness and tingling in both hands 02/24/2017  . OSA (obstructive sleep apnea) 01/31/2017  . Liver cirrhosis secondary to NASH ([place]) 01/31/2017  . Hypothyroidism 01/31/2017  . Chronic narcotic use 01/31/2017  . Fibromyalgia 01/31/2017  . Hyperlipidemia 01/31/2017  . Gastroparesis   . Memory disorder 07/25/2016  . Pain in toe 04/09/2016  . Lump in neck 01/29/2016  . Chronic diastolic (congestive) heart failure ([place]) 01/03/2016  . Fatty liver 01/03/2016  . Type 1 diabetes mellitus ([place]) 01/03/2016  . Recurrent Clostridium difficile diarrhea 01/03/2016  . Chronic respiratory failure ([place]) 11/10/2014  . Physical deconditioning 09/26/2014  . Dyspnea 09/26/2014  . Iron deficiency anemia, unspecified  04/05/2011  . Bariatric surgery status 04/05/2011  . Left arm pain   . UNSPECIFIED VITAMIN D DEFICIENCY 10/22/2007  . LOW BACK PAIN, CHRONIC 10/22/2007  . INSOMNIA 10/22/2007  . Obesity 10/14/2007  . Chronic pain syndrome 10/14/2007  . CARPAL TUNNEL SYNDROME 10/14/2007  . PERIPHERAL NEUROPATHY 10/14/2007  . ALLERGIC RHINITIS CAUSE UNSPECIFIED 10/14/2007  . ARTHRITIS 10/14/2007  . Anxiety state 08/25/2007  . Depression 08/25/2007  . Essential hypertension 08/25/2007  . ASTHMA 08/25/2007  . CONSTIPATION 08/25/2007  . POLYMYALGIA RHEUMATICA 08/25/2007  . LEG EDEMA, BILATERAL 08/25/2007    Current Outpatient Medications on File Prior to Visit  Medication Sig Dispense Refill  .  albuterol (PROVENTIL HFA;VENTOLIN HFA) 108 (90 Base) MCG/ACT inhaler Inhale 2 puffs into the lungs every 6 (six) hours as needed for wheezing or shortness of breath. 1 Inhaler 11  . amitriptyline (ELAVIL) 100 MG tablet TAKE 1 TABLET BY MOUTH EVERY DAY AT NIGHT  3  . amLODipine (NORVASC) 5 MG tablet TAKE 1 TABLET BY MOUTH TWICE A DAY 180 tablet 1  . aspirin EC 81 MG tablet Take 1 tablet (81 mg total) by mouth daily. 90 tablet 3  . atorvastatin (LIPITOR) 40 MG tablet TAKE 1 TABLET BY MOUTH EVERY DAY 90 tablet 3  . busPIRone (BUSPAR) 7.5 MG tablet TAKE 1 TABLET (7.5 MG TOTAL) BY MOUTH 3 (THREE) TIMES DAILY AS NEEDED.  1  . furosemide (LASIX) 20 MG tablet Take 1 tablet (20 mg total) by mouth daily. 30 tablet 3  . hydrALAZINE (APRESOLINE) 50 MG tablet Take 1 tablet (50 mg total) by mouth 2 (two) times daily. 180 tablet 3  . HYDROmorphone (DILAUDID) 4 MG tablet Take 1 tablet (4 mg total) by mouth every 8 (eight) hours as needed for moderate pain. (Patient taking differently: Take 4 mg by mouth 3 (three) times daily. )    . insulin NPH Human (NOVOLIN N RELION) 100 UNIT/ML injection Inject 1.7 mLs (170 Units total) into the skin every morning. And syringes 1/day 60 mL 11  . Insulin Syringe-Needle U-100 (INSULIN SYRINGE 1CC/30GX5/16") 30G X 5/16" 1 ML MISC Use as directed for insulin injections 5 times daily 150 each 1  . ipratropium-albuterol (DUONEB) 0.5-2.5 (3) MG/3ML SOLN Take 3 mLs by nebulization every 6 (six) hours as needed (Wheezing or  dyspnea.).    Marland Kitchen levothyroxine (SYNTHROID, LEVOTHROID) 175 MCG tablet TAKE 1 TABLET BY MOUTH SIX DAYS A WEEK BEFORE BREAKFAST 90 tablet 0  . Lidocaine HCl (ASPERCREME LIDOCAINE) 4 % LIQD Apply 1 application topically as needed (pain).     . meloxicam (MOBIC) 7.5 MG tablet Take 15 mg by mouth daily.   5  . metoprolol tartrate (LOPRESSOR) 25 MG tablet Take 1 tablet (25 mg total) by mouth 2 (two) times daily. 180 tablet 3  . morphine (MS CONTIN) 30 MG 12 hr tablet Take 30  mg by mouth 2 (two) times daily.    . ondansetron (ZOFRAN-ODT) 4 MG disintegrating tablet Take 1 tablet (4 mg total) by mouth every 6 (six) hours as needed for nausea or vomiting. 30 tablet 3  . potassium chloride SA (KLOR-CON M20) 20 MEQ tablet TAKE 2 TABLETS EVERY DAY AS NEEDED FOR CRAMPING 180 tablet 1  . tiZANidine (ZANAFLEX) 2 MG tablet Take 2 mg by mouth as needed for muscle spasms.   2   No current facility-administered medications on file prior to visit.     Past Medical History:  Diagnosis Date  . AKI (acute kidney injury) ([place]) 01/2017  . Anxiety    with panic attacks  . Arthritis    "back; feet; hands; shoulders" (08/26/2014)  . Asthma   . Cervical cancer ([place])   . Chronic lower back pain   . Chronic narcotic use   . Chronic pain syndrome    [facility]  . Cirrhosis ([place])   . Clostridium difficile infection 2017  . COPD (chronic obstructive pulmonary disease) ([place])   . Daily headache   . Depression   . Diabetic neuropathy ([place]) 06/04/2017  . DJD (degenerative joint disease)   . Fatty liver disease, nonalcoholic   . Fibromyalgia   . Frequency of urination   . HCAP (healthcare-associated pneumonia) 08/26/2014  . History of TIA (transient ischemic attack) 11-01-2010   NO RESIDUAL  . Hyperlipidemia   . Hypertension   . Hypothyroidism   . IDDM (insulin dependent diabetes mellitus) ([place])   . Insomnia   . Lumbar stenosis   . Memory difficulty 07/25/2016  . Nocturia   . OSA (obstructive sleep apnea)    NO CPAP SINCE WT LOSS  . Osteoarthritis    with severe disease in knee  . Pneumonia "several times"  . Polymyalgia rheumatica ([place])   . Scoliosis   . Seasonal allergies   . Thyroid cancer ([place])   . Urgency of urination   . Vaginal pain S/P SLING  FEB 2012    Past Surgical History:  Procedure Laterality Date  . APPENDECTOMY  1982  . BREAST EXCISIONAL BIOPSY Left 02/28/2005   Atypical Ductal Hyperplasia  . CARDIAC CATHETERIZATION  09-04-2004    NORMAL CORONARY ANATOMY/ NORMAL LVF/ EF 60%  . CARDIOVASCULAR STRESS TEST  12-27-2010  DR [place]ique   ABNORMAL NUCLEAR STUDY W/ /MILD INFERIOR ISCHEMIA/ EF 69%/  CT HEART ANGIOGRAM ;  NO ACUTE FINDINGS  . CRYOABLATION  05/16/2003   w/LEEP FOR ABNORMAL PAP SMEAR  . CYSTOSCOPY  05/18/2012   Procedure: CYSTOSCOPY;  Surgeon: Reece Packer, MD;  Location: [facility];  Service: Urology;  Laterality: N/A;  examination under anethesia  . ESOPHAGOGASTRODUODENOSCOPY (EGD) WITH PROPOFOL N/A 09/03/2016   Procedure: ESOPHAGOGASTRODUODENOSCOPY (EGD) WITH PROPOFOL;  Surgeon: Doran Stabler, MD;  Location[facility];  Service: Gastroenterology;  Laterality: N/A;  . HYSTEROSCOPY W/D&C  08-19-2007  PMB  . KNEE ARTHROSCOPY W/ DEBRIDEMENT Left 03/29/2006   INTERNAL DERANGEMENT/ SEVERE DJD/ MENISCUS TEARS  . LAPAROSCOPIC CHOLECYSTECTOMY  06-10-2002  . LAPAROSCOPIC GASTRIC BANDING  03/01/2006   TRUNCAL VAGOTOMY/ PLACEMENT OF VG BAND  . REVISION TOTAL KNEE ARTHROPLASTY Left 08-29-2008; 05/2009  . TONSILLECTOMY  1969  . TOTAL KNEE ARTHROPLASTY Left 01-23-2007   SEVERE DJD  . TOTAL THYROIDECTOMY  11-22-2005   BILATERAL THYROID NODULES-- PAPILLARY CARCINOMA (0.5CM)/ ADENOMATOID NODULES  . TRANSTHORACIC ECHOCARDIOGRAM  12-27-2010   LVSF NORMAL / EF 69-62%/ GRADE I DIASTOLIC DYSFUNCTION/ MILD MITRAL REGURG. / MILDLY DILATED LEFT ATRIUM/ MILDY INCREASED SYSTOLIC PRESSURE OF PULMONARY ARTERIES  . TRANSVAGINAL SUBURETERAL TAPE/ SLING  09-28-2010   MIXED URINARY INCONTINENCE  . TUBAL LIGATION  1983    Social History   Socioeconomic History  . Marital status: Married    Spouse name: Not on file  . Number of children: 2  . Years of education: 38  . Highest education level: Not on file  Occupational History  . Occupation: disabled    Fish farm manager: UNEMPLOYED  Social Needs  . Financial resource strain: Not on file  . Food insecurity:    Worry: Not on file    Inability: Not on file  .  Transportation needs:    Medical: Not on file    Non-medical: Not on file  Tobacco Use  . Smoking status: Former Smoker    Packs/day: 2.50    Years: 39.00    Pack years: 97.50    Types: Cigarettes    Last attempt to quit: 10/22/2010    Years since quitting: 7.5  . Smokeless tobacco: Never Used  Substance and Sexual Activity  . Alcohol use: No    Alcohol/week: 0.0 standard drinks  . Drug use: No  . Sexual activity: Not Currently  Lifestyle  . Physical activity:    Days per week: Not on file    Minutes per session: Not on file  . Stress: Not on file  Relationships  . Social connections:    Talks on phone: Not on file    Gets together: Not on file    Attends religious service: Not on file    Active member of club or organization: Not on file    Attends meetings of clubs or organizations: Not on file    Relationship status: Not on file  Other Topics Concern  . Not on file  Social History Narrative   Lives at home w/ her husband and grandson   Right-handed   Caffeine: 1 cup of coffee per week + [employer]    Family History  Problem Relation Age of Onset  . Diabetes Mother   . Heart disease Mother   . Dementia Mother   . Heart disease Father   . High blood pressure Father   . Colon cancer Maternal Uncle        x 2  . Breast cancer Other        great aunts x 5    Review of Systems     Objective:  There were no vitals filed for this visit. BP Readings from Last 3 Encounters:  04/30/18 124/82  04/20/18 100/70  04/07/18 130/70   Wt Readings from Last 3 Encounters:  04/30/18 (!) 303 lb 3.2 oz (137.5 kg)  04/20/18 293 lb (132.9 kg)  04/07/18 (!) 301 lb 9.6 oz (136.8 kg)   There is no height or weight on file to calculate BMI.   Physical Exam  Assessment & Plan:    See Problem List for Assessment and Plan of chronic medical problems.

## 2018-05-12 ENCOUNTER — Ambulatory Visit: Payer: Medicare HMO | Admitting: Internal Medicine

## 2018-05-12 DIAGNOSIS — J449 Chronic obstructive pulmonary disease, unspecified: Secondary | ICD-10-CM | POA: Diagnosis not present

## 2018-05-13 DIAGNOSIS — J449 Chronic obstructive pulmonary disease, unspecified: Secondary | ICD-10-CM | POA: Diagnosis not present

## 2018-05-13 NOTE — Progress Notes (Signed)
Subjective:    Patient ID: Mary Acosta, female    DOB: May 13, 1956, 62 y.o.   MRN: [id number]  HPI The patient is here for an acute visit.  She is here with her husband.  She has not been feeling well.  She states she feels weak, tired and out of breath for the past couple of weeks. Yesterday she felt a little bad, but she was able to run errands. The shortness of breath has been much worse today.  She stopped eating sugar 7 days ago and wonders if that is causing some of her symptoms.  She is not experiencing any fever, cold symptoms, coughing or wheezing.  She does have difficulty breathing.  On arrival her blood pressure was on the low side as well.  She states she is drinking fluids.  Her oxygen was on the lower side, but stable for her.  As we were reviewing her symptoms she stated that she may have taken more of her pain medication than she was supposed to.  She wonders if she put too many pills in her pillbox.  She states she has felt this way in the past and it was related to too much pain medication.  She did take out her Narcan nasal spray and used it.  She did start to feel better-she was less short of breath.  After a couple of minutes she started to have some increasing shortness of breath and felt that she needed another dose.  This is not something that we carry in the office and I felt that she needed to be monitored and we agreed to call 911 so that she can be transported to the hospital.       Medications and allergies reviewed with patient and updated if appropriate.  Patient Active Problem List   Diagnosis Date Noted  . Thyroid cancer ([place]) 04/30/2018  . Obesity hypoventilation syndrome ([place]) 09/24/2017  . Palpitations 09/18/2017  . Diabetic neuropathy ([place]) 06/04/2017  . Vitamin B12 deficiency 03/05/2017  . Numbness and tingling in both hands 02/24/2017  . OSA (obstructive sleep apnea) 01/31/2017  . Liver cirrhosis secondary to NASH ([place]) 01/31/2017  .  Hypothyroidism 01/31/2017  . Chronic narcotic use 01/31/2017  . Fibromyalgia 01/31/2017  . Hyperlipidemia 01/31/2017  . Gastroparesis   . Memory disorder 07/25/2016  . Pain in toe 04/09/2016  . Lump in neck 01/29/2016  . Chronic diastolic (congestive) heart failure ([place]) 01/03/2016  . Fatty liver 01/03/2016  . Type 1 diabetes mellitus ([place]) 01/03/2016  . Recurrent Clostridium difficile diarrhea 01/03/2016  . Chronic respiratory failure ([place]) 11/10/2014  . Physical deconditioning 09/26/2014  . Dyspnea 09/26/2014  . Iron deficiency anemia, unspecified  04/05/2011  . Bariatric surgery status 04/05/2011  . Left arm pain   . UNSPECIFIED VITAMIN D DEFICIENCY 10/22/2007  . LOW BACK PAIN, CHRONIC 10/22/2007  . INSOMNIA 10/22/2007  . Obesity 10/14/2007  . Chronic pain syndrome 10/14/2007  . CARPAL TUNNEL SYNDROME 10/14/2007  . PERIPHERAL NEUROPATHY 10/14/2007  . ALLERGIC RHINITIS CAUSE UNSPECIFIED 10/14/2007  . ARTHRITIS 10/14/2007  . Anxiety state 08/25/2007  . Depression 08/25/2007  . Essential hypertension 08/25/2007  . ASTHMA 08/25/2007  . CONSTIPATION 08/25/2007  . POLYMYALGIA RHEUMATICA 08/25/2007  . LEG EDEMA, BILATERAL 08/25/2007    Current Outpatient Medications on File Prior to Visit  Medication Sig Dispense Refill  . albuterol (PROVENTIL HFA;VENTOLIN HFA) 108 (90 Base) MCG/ACT inhaler Inhale 2 puffs into the lungs every 6 (six) hours as needed for  wheezing or shortness of breath. 1 Inhaler 11  . amitriptyline (ELAVIL) 100 MG tablet TAKE 1 TABLET BY MOUTH EVERY DAY AT NIGHT  3  . amLODipine (NORVASC) 5 MG tablet TAKE 1 TABLET BY MOUTH TWICE A DAY 180 tablet 1  . aspirin EC 81 MG tablet Take 1 tablet (81 mg total) by mouth daily. 90 tablet 3  . atorvastatin (LIPITOR) 40 MG tablet TAKE 1 TABLET BY MOUTH EVERY DAY 90 tablet 3  . busPIRone (BUSPAR) 7.5 MG tablet TAKE 1 TABLET (7.5 MG TOTAL) BY MOUTH 3 (THREE) TIMES DAILY AS NEEDED.  1  . hydrALAZINE (APRESOLINE) 50 MG tablet  Take 1 tablet (50 mg total) by mouth 2 (two) times daily. 180 tablet 3  . HYDROmorphone (DILAUDID) 4 MG tablet Take 1 tablet (4 mg total) by mouth every 8 (eight) hours as needed for moderate pain. (Patient taking differently: Take 4 mg by mouth 3 (three) times daily. )    . insulin NPH Human (NOVOLIN N RELION) 100 UNIT/ML injection Inject 1.7 mLs (170 Units total) into the skin every morning. And syringes 1/day 60 mL 11  . Insulin Syringe-Needle U-100 (INSULIN SYRINGE 1CC/30GX5/16") 30G X 5/16" 1 ML MISC Use as directed for insulin injections 5 times daily 150 each 1  . ipratropium-albuterol (DUONEB) 0.5-2.5 (3) MG/3ML SOLN Take 3 mLs by nebulization every 6 (six) hours as needed (Wheezing or dyspnea.).    Marland Kitchen levothyroxine (SYNTHROID, LEVOTHROID) 175 MCG tablet TAKE 1 TABLET BY MOUTH SIX DAYS A WEEK BEFORE BREAKFAST 90 tablet 0  . Lidocaine HCl (ASPERCREME LIDOCAINE) 4 % LIQD Apply 1 application topically as needed (pain).     . meloxicam (MOBIC) 7.5 MG tablet Take 15 mg by mouth daily.   5  . metoprolol tartrate (LOPRESSOR) 25 MG tablet Take 1 tablet (25 mg total) by mouth 2 (two) times daily. 180 tablet 3  . morphine (MS CONTIN) 30 MG 12 hr tablet Take 30 mg by mouth 2 (two) times daily.    . ondansetron (ZOFRAN-ODT) 4 MG disintegrating tablet Take 1 tablet (4 mg total) by mouth every 6 (six) hours as needed for nausea or vomiting. 30 tablet 3  . potassium chloride SA (KLOR-CON M20) 20 MEQ tablet TAKE 2 TABLETS EVERY DAY AS NEEDED FOR CRAMPING 180 tablet 1  . tiZANidine (ZANAFLEX) 2 MG tablet Take 2 mg by mouth as needed for muscle spasms.   2  . furosemide (LASIX) 20 MG tablet Take 1 tablet (20 mg total) by mouth daily. (Patient not taking: Reported on 05/14/2018) 30 tablet 3   No current facility-administered medications on file prior to visit.     Past Medical History:  Diagnosis Date  . AKI (acute kidney injury) ([place]) 01/2017  . Anxiety    with panic attacks  . Arthritis    "back; feet;  hands; shoulders" (08/26/2014)  . Asthma   . Cervical cancer ([place])   . Chronic lower back pain   . Chronic narcotic use   . Chronic pain syndrome    [facility]  . Cirrhosis ([place])   . Clostridium difficile infection 2017  . COPD (chronic obstructive pulmonary disease) ([place])   . Daily headache   . Depression   . Diabetic neuropathy ([place]) 06/04/2017  . DJD (degenerative joint disease)   . Fatty liver disease, nonalcoholic   . Fibromyalgia   . Frequency of urination   . HCAP (healthcare-associated pneumonia) 08/26/2014  . History of TIA (transient ischemic attack) 11-01-2010  NO RESIDUAL  . Hyperlipidemia   . Hypertension   . Hypothyroidism   . IDDM (insulin dependent diabetes mellitus) ([place])   . Insomnia   . Lumbar stenosis   . Memory difficulty 07/25/2016  . Nocturia   . OSA (obstructive sleep apnea)    NO CPAP SINCE WT LOSS  . Osteoarthritis    with severe disease in knee  . Pneumonia "several times"  . Polymyalgia rheumatica ([place])   . Scoliosis   . Seasonal allergies   . Thyroid cancer ([place])   . Urgency of urination   . Vaginal pain S/P SLING  FEB 2012    Past Surgical History:  Procedure Laterality Date  . APPENDECTOMY  1982  . BREAST EXCISIONAL BIOPSY Left 02/28/2005   Atypical Ductal Hyperplasia  . CARDIAC CATHETERIZATION  09-04-2004   NORMAL CORONARY ANATOMY/ NORMAL LVF/ EF 60%  . CARDIOVASCULAR STRESS TEST  12-27-2010  DR [place]   ABNORMAL NUCLEAR STUDY W/ /MILD INFERIOR ISCHEMIA/ EF 69%/  CT HEART ANGIOGRAM ;  NO ACUTE FINDINGS  . CRYOABLATION  05/16/2003   w/LEEP FOR ABNORMAL PAP SMEAR  . CYSTOSCOPY  05/18/2012   Procedure: CYSTOSCOPY;  Surgeon: Reece Packer, MD;  Location: [facility];  Service: Urology;  Laterality: N/A;  examination under anethesia  . ESOPHAGOGASTRODUODENOSCOPY (EGD) WITH PROPOFOL N/A 09/03/2016   Procedure: ESOPHAGOGASTRODUODENOSCOPY (EGD) WITH PROPOFOL;  Surgeon: Doran Stabler, MD;  Location: [facility]  ENDOSCOPY;  Service: Gastroenterology;  Laterality: N/A;  . HYSTEROSCOPY W/D&C  08-19-2007   PMB  . KNEE ARTHROSCOPY W/ DEBRIDEMENT Left 03/29/2006   INTERNAL DERANGEMENT/ SEVERE DJD/ MENISCUS TEARS  . LAPAROSCOPIC CHOLECYSTECTOMY  06-10-2002  . LAPAROSCOPIC GASTRIC BANDING  03/01/2006   TRUNCAL VAGOTOMY/ PLACEMENT OF VG BAND  . REVISION TOTAL KNEE ARTHROPLASTY Left 08-29-2008; 05/2009  . TONSILLECTOMY  1969  . TOTAL KNEE ARTHROPLASTY Left 01-23-2007   SEVERE DJD  . TOTAL THYROIDECTOMY  11-22-2005   BILATERAL THYROID NODULES-- PAPILLARY CARCINOMA (0.5CM)/ ADENOMATOID NODULES  . TRANSTHORACIC ECHOCARDIOGRAM  12-27-2010   LVSF NORMAL / EF 31-49%/ GRADE I DIASTOLIC DYSFUNCTION/ MILD MITRAL REGURG. / MILDLY DILATED LEFT ATRIUM/ MILDY INCREASED SYSTOLIC PRESSURE OF PULMONARY ARTERIES  . TRANSVAGINAL SUBURETERAL TAPE/ SLING  09-28-2010   MIXED URINARY INCONTINENCE  . TUBAL LIGATION  1983    Social History   Socioeconomic History  . Marital status: Married    Spouse name: Not on file  . Number of children: 2  . Years of education: 64  . Highest education level: Not on file  Occupational History  . Occupation: disabled    Fish farm manager: UNEMPLOYED  Social Needs  . Financial resource strain: Not on file  . Food insecurity:    Worry: Not on file    Inability: Not on file  . Transportation needs:    Medical: Not on file    Non-medical: Not on file  Tobacco Use  . Smoking status: Former Smoker    Packs/day: 2.50    Years: 39.00    Pack years: 97.50    Types: Cigarettes    Last attempt to quit: 10/22/2010    Years since quitting: 7.5  . Smokeless tobacco: Never Used  Substance and Sexual Activity  . Alcohol use: No    Alcohol/week: 0.0 standard drinks  . Drug use: No  . Sexual activity: Not Currently  Lifestyle  . Physical activity:    Days per week: Not on file    Minutes per session: Not on file  . Stress:  Not on file  Relationships  . Social connections:    Talks on phone:  Not on file    Gets together: Not on file    Attends religious service: Not on file    Active member of club or organization: Not on file    Attends meetings of clubs or organizations: Not on file    Relationship status: Not on file  Other Topics Concern  . Not on file  Social History Narrative   Lives at home w/ her husband and grandson   Right-handed   Caffeine: 1 cup of coffee per week + [employer]    Family History  Problem Relation Age of Onset  . Diabetes Mother   . Heart disease Mother   . Dementia Mother   . Heart disease Father   . High blood pressure Father   . Colon cancer Maternal Uncle        x 2  . Breast cancer Other        great aunts x 5    Review of Systems  Constitutional: Negative for chills and fever.  HENT: Negative for congestion and sore throat.   Respiratory: Positive for shortness of breath. Negative for cough and wheezing.   Cardiovascular: Negative for chest pain, palpitations and leg swelling.  Gastrointestinal: Positive for constipation. Negative for abdominal pain, diarrhea and nausea.  Genitourinary: Positive for dysuria. Negative for frequency and hematuria.  Musculoskeletal: Positive for myalgias (leg muscles get hard and ache).  Neurological: Positive for light-headedness (a few days ago). Negative for headaches.  Psychiatric/Behavioral: Negative for confusion.       Objective:   Vitals:   05/14/18 0951 05/14/18 1028  BP: (!) 82/40 110/82  Pulse: 63   Resp: 18   Temp: 98.6 F (37 C)   SpO2: 93%    BP Readings from Last 3 Encounters:  05/14/18 110/82  04/30/18 124/82  04/20/18 100/70   Wt Readings from Last 3 Encounters:  05/14/18 292 lb (132.5 kg)  04/30/18 (!) 303 lb 3.2 oz (137.5 kg)  04/20/18 293 lb (132.9 kg)   Body mass index is 44.4 kg/m.   Physical Exam  Constitutional: She is oriented to person, place, and time. She appears well-developed and well-nourished. She appears distressed (Mild tachypnea).  Slightly  drowsy  HENT:  Head: Normocephalic and atraumatic.  Pinpoint pupils  Eyes: Conjunctivae are normal.  He was were initially pinpoint and there was increased dilation after using Narcan  Neck: No tracheal deviation present. No thyromegaly present.  Cardiovascular: Normal rate and regular rhythm.  No murmur heard. Pulmonary/Chest: No stridor. She is in respiratory distress (mild, SOB, tachypnea). She has wheezes (minimal).  Abdominal: Soft. She exhibits no distension. There is no tenderness.  Musculoskeletal: She exhibits no edema (chronic venous stasis skin changes b/l LE).  Lymphadenopathy:    She has no cervical adenopathy.  Neurological: She is oriented to person, place, and time.  Skin: She is diaphoretic (Mild).  Slight clamminess, no erythema           Assessment & Plan:    See Problem List for Assessment and Plan of chronic medical problems.

## 2018-05-14 ENCOUNTER — Encounter: Payer: Self-pay | Admitting: Internal Medicine

## 2018-05-14 ENCOUNTER — Encounter (HOSPITAL_COMMUNITY): Payer: Self-pay | Admitting: Emergency Medicine

## 2018-05-14 ENCOUNTER — Emergency Department (HOSPITAL_COMMUNITY)
Admission: EM | Admit: 2018-05-14 | Discharge: 2018-05-14 | Discharge status: Against Medical Advice | Payer: Medicare HMO | Attending: Emergency Medicine | Admitting: Emergency Medicine

## 2018-05-14 ENCOUNTER — Ambulatory Visit (INDEPENDENT_AMBULATORY_CARE_PROVIDER_SITE_OTHER): Payer: Medicare HMO | Admitting: Internal Medicine

## 2018-05-14 ENCOUNTER — Other Ambulatory Visit: Payer: Self-pay

## 2018-05-14 VITALS — BP 110/82 | HR 63 | Temp 98.6°F | Resp 18 | Ht 68.0 in | Wt 292.0 lb

## 2018-05-14 DIAGNOSIS — I5032 Chronic diastolic (congestive) heart failure: Secondary | ICD-10-CM | POA: Diagnosis not present

## 2018-05-14 DIAGNOSIS — Z7982 Long term (current) use of aspirin: Secondary | ICD-10-CM | POA: Diagnosis not present

## 2018-05-14 DIAGNOSIS — J449 Chronic obstructive pulmonary disease, unspecified: Secondary | ICD-10-CM | POA: Insufficient documentation

## 2018-05-14 DIAGNOSIS — Z87891 Personal history of nicotine dependence: Secondary | ICD-10-CM | POA: Insufficient documentation

## 2018-05-14 DIAGNOSIS — E039 Hypothyroidism, unspecified: Secondary | ICD-10-CM | POA: Diagnosis not present

## 2018-05-14 DIAGNOSIS — T40601A Poisoning by unspecified narcotics, accidental (unintentional), initial encounter: Secondary | ICD-10-CM | POA: Diagnosis not present

## 2018-05-14 DIAGNOSIS — Z96652 Presence of left artificial knee joint: Secondary | ICD-10-CM | POA: Insufficient documentation

## 2018-05-14 DIAGNOSIS — I110 Hypertensive heart disease with heart failure: Secondary | ICD-10-CM | POA: Diagnosis not present

## 2018-05-14 DIAGNOSIS — E109 Type 1 diabetes mellitus without complications: Secondary | ICD-10-CM | POA: Insufficient documentation

## 2018-05-14 DIAGNOSIS — R69 Illness, unspecified: Secondary | ICD-10-CM | POA: Diagnosis not present

## 2018-05-14 DIAGNOSIS — T507X1A Poisoning by analeptics and opioid receptor antagonists, accidental (unintentional), initial encounter: Secondary | ICD-10-CM | POA: Insufficient documentation

## 2018-05-14 DIAGNOSIS — Z79899 Other long term (current) drug therapy: Secondary | ICD-10-CM | POA: Diagnosis not present

## 2018-05-14 DIAGNOSIS — Z794 Long term (current) use of insulin: Secondary | ICD-10-CM | POA: Insufficient documentation

## 2018-05-14 DIAGNOSIS — R531 Weakness: Secondary | ICD-10-CM | POA: Diagnosis not present

## 2018-05-14 DIAGNOSIS — R0902 Hypoxemia: Secondary | ICD-10-CM | POA: Diagnosis not present

## 2018-05-14 DIAGNOSIS — I10 Essential (primary) hypertension: Secondary | ICD-10-CM | POA: Diagnosis not present

## 2018-05-14 NOTE — Assessment & Plan Note (Signed)
She thinks she took too much pain medication - she wonders if she put too many pills in her pillbox She was SOB, drowsy, had pinpoint pupils and after a dose of narcan her breathing improved- she was less SOB, her pupils were not pinpoint and she was less drowsy After a couple of minutes she felt she needed another dose of narcan - we do not have that in our office and I felt she needed to be monitored so EMS was called and she was taken to the ED She follows with pain management and will need to follow up with them

## 2018-05-14 NOTE — ED Notes (Signed)
Bed: [facility] Expected date:  Expected time:  Means of arrival:  Comments: 

## 2018-05-14 NOTE — ED Notes (Signed)
Bed: KP53 Expected date:  Expected time:  Means of arrival:  Comments: EMS-took too much of her meds

## 2018-05-14 NOTE — ED Provider Notes (Signed)
[facility] DEPT Provider Note   CSN: [id number] Arrival date & time: 05/14/18  1052     History   Chief Complaint Chief Complaint  Patient presents with  . Drug Overdose    HPI Mary Acosta is a 62 y.o. female.  Pt presents to the ED today from her doctor's office.  She takes large doses of opiates for pain (30 mg morphine q12h and 4 mg of dilaudid q8h).  She thinks she took an extra dose of her medication.  The pt went to her doctor's office because she felt funny.  BP 82/40 at doctor's office.  They gave her 4 mg of narcan IN and called EMS.  When pt arrived here, she was awake and alert.  She was able to get off the stretcher to walk to the bathroom by herself.  She has no complaints.     Past Medical History:  Diagnosis Date  . AKI (acute kidney injury) ([place]) 01/2017  . Anxiety    with panic attacks  . Arthritis    "back; feet; hands; shoulders" (08/26/2014)  . Asthma   . Cervical cancer ([place])   . Chronic lower back pain   . Chronic narcotic use   . Chronic pain syndrome    [facility]  . Cirrhosis ([place])   . Clostridium difficile infection 2017  . COPD (chronic obstructive pulmonary disease) ([place])   . Daily headache   . Depression   . Diabetic neuropathy ([place]) 06/04/2017  . DJD (degenerative joint disease)   . Fatty liver disease, nonalcoholic   . Fibromyalgia   . Frequency of urination   . HCAP (healthcare-associated pneumonia) 08/26/2014  . History of TIA (transient ischemic attack) 11-01-2010   NO RESIDUAL  . Hyperlipidemia   . Hypertension   . Hypothyroidism   . IDDM (insulin dependent diabetes mellitus) ([place])   . Insomnia   . Lumbar stenosis   . Memory difficulty 07/25/2016  . Nocturia   . OSA (obstructive sleep apnea)    NO CPAP SINCE WT LOSS  . Osteoarthritis    with severe disease in knee  . Pneumonia "several times"  . Polymyalgia rheumatica ([place])   . Scoliosis   . Seasonal allergies   .  Thyroid cancer ([place])   . Urgency of urination   . Vaginal pain S/P SLING  FEB 2012    Patient Active Problem List   Diagnosis Date Noted  . Thyroid cancer ([place]) 04/30/2018  . Obesity hypoventilation syndrome ([place]) 09/24/2017  . Palpitations 09/18/2017  . Diabetic neuropathy ([place]) 06/04/2017  . Vitamin B12 deficiency 03/05/2017  . Numbness and tingling in both hands 02/24/2017  . OSA (obstructive sleep apnea) 01/31/2017  . Liver cirrhosis secondary to NASH ([place]) 01/31/2017  . Hypothyroidism 01/31/2017  . Chronic narcotic use 01/31/2017  . Fibromyalgia 01/31/2017  . Hyperlipidemia 01/31/2017  . Gastroparesis   . Memory disorder 07/25/2016  . Pain in toe 04/09/2016  . Lump in neck 01/29/2016  . Chronic diastolic (congestive) heart failure ([place]) 01/03/2016  . Fatty liver 01/03/2016  . Type 1 diabetes mellitus ([place]) 01/03/2016  . Recurrent Clostridium difficile diarrhea 01/03/2016  . Chronic respiratory failure ([place]) 11/10/2014  . Physical deconditioning 09/26/2014  . Dyspnea 09/26/2014  . Iron deficiency anemia, unspecified  04/05/2011  . Bariatric surgery status 04/05/2011  . Left arm pain   . UNSPECIFIED VITAMIN D DEFICIENCY 10/22/2007  . LOW BACK PAIN, CHRONIC 10/22/2007  . INSOMNIA 10/22/2007  .  Obesity 10/14/2007  . Chronic pain syndrome 10/14/2007  . CARPAL TUNNEL SYNDROME 10/14/2007  . PERIPHERAL NEUROPATHY 10/14/2007  . ALLERGIC RHINITIS CAUSE UNSPECIFIED 10/14/2007  . ARTHRITIS 10/14/2007  . Anxiety state 08/25/2007  . Depression 08/25/2007  . Essential hypertension 08/25/2007  . ASTHMA 08/25/2007  . CONSTIPATION 08/25/2007  . POLYMYALGIA RHEUMATICA 08/25/2007  . LEG EDEMA, BILATERAL 08/25/2007    Past Surgical History:  Procedure Laterality Date  . APPENDECTOMY  1982  . BREAST EXCISIONAL BIOPSY Left 02/28/2005   Atypical Ductal Hyperplasia  . CARDIAC CATHETERIZATION  09-04-2004   NORMAL CORONARY ANATOMY/ NORMAL LVF/ EF 60%  . CARDIOVASCULAR STRESS TEST   12-27-2010  DR [place]   ABNORMAL NUCLEAR STUDY W/ /MILD INFERIOR ISCHEMIA/ EF 69%/  CT HEART ANGIOGRAM ;  NO ACUTE FINDINGS  . CRYOABLATION  05/16/2003   w/LEEP FOR ABNORMAL PAP SMEAR  . CYSTOSCOPY  05/18/2012   Procedure: CYSTOSCOPY;  Surgeon: Reece Packer, MD;  Location[facility];  Service: Urology;  Laterality: N/A;  examination under anethesia  . ESOPHAGOGASTRODUODENOSCOPY (EGD) WITH PROPOFOL N/A 09/03/2016   Procedure: ESOPHAGOGASTRODUODENOSCOPY (EGD) WITH PROPOFOL;  Surgeon: Doran Stabler, MD;  Location: [facility];  Service: Gastroenterology;  Laterality: N/A;  . HYSTEROSCOPY W/D&C  08-19-2007   PMB  . KNEE ARTHROSCOPY W/ DEBRIDEMENT Left 03/29/2006   INTERNAL DERANGEMENT/ SEVERE DJD/ MENISCUS TEARS  . LAPAROSCOPIC CHOLECYSTECTOMY  06-10-2002  . LAPAROSCOPIC GASTRIC BANDING  03/01/2006   TRUNCAL VAGOTOMY/ PLACEMENT OF VG BAND  . REVISION TOTAL KNEE ARTHROPLASTY Left 08-29-2008; 05/2009  . TONSILLECTOMY  1969  . TOTAL KNEE ARTHROPLASTY Left 01-23-2007   SEVERE DJD  . TOTAL THYROIDECTOMY  11-22-2005   BILATERAL THYROID NODULES-- PAPILLARY CARCINOMA (0.5CM)/ ADENOMATOID NODULES  . TRANSTHORACIC ECHOCARDIOGRAM  12-27-2010   LVSF NORMAL / EF 81-19%/ GRADE I DIASTOLIC DYSFUNCTION/ MILD MITRAL REGURG. / MILDLY DILATED LEFT ATRIUM/ MILDY INCREASED SYSTOLIC PRESSURE OF PULMONARY ARTERIES  . TRANSVAGINAL SUBURETERAL TAPE/ SLING  09-28-2010   MIXED URINARY INCONTINENCE  . TUBAL LIGATION  1983     OB History   None      Home Medications    Prior to Admission medications   Medication Sig Start Date End Date Taking? Authorizing Provider  albuterol (PROVENTIL HFA;VENTOLIN HFA) 108 (90 Base) MCG/ACT inhaler Inhale 2 puffs into the lungs every 6 (six) hours as needed for wheezing or shortness of breath. 03/23/18   Burns, Claudina Lick, MD  amitriptyline (ELAVIL) 100 MG tablet TAKE 1 TABLET BY MOUTH EVERY DAY AT NIGHT 04/19/18   [provider]  amLODipine  (NORVASC) 5 MG tablet TAKE 1 TABLET BY MOUTH TWICE A DAY 02/19/18   Binnie Rail, MD  aspirin EC 81 MG tablet Take 1 tablet (81 mg total) by mouth daily. 03/10/18   Lelon Perla, MD  atorvastatin (LIPITOR) 40 MG tablet TAKE 1 TABLET BY MOUTH EVERY DAY 09/15/17   Lelon Perla, MD  busPIRone (BUSPAR) 7.5 MG tablet TAKE 1 TABLET (7.5 MG TOTAL) BY MOUTH 3 (THREE) TIMES DAILY AS NEEDED. 04/20/18   [provider]  furosemide (LASIX) 20 MG tablet Take 1 tablet (20 mg total) by mouth daily. Patient not taking: Reported on 05/14/2018 05/05/18 08/03/18  Lelon Perla, MD  hydrALAZINE (APRESOLINE) 50 MG tablet Take 1 tablet (50 mg total) by mouth 2 (two) times daily. 03/10/18   Lelon Perla, MD  HYDROmorphone (DILAUDID) 4 MG tablet Take 1 tablet (4 mg total) by mouth every 8 (eight) hours  as needed for moderate pain. Patient taking differently: Take 4 mg by mouth 3 (three) times daily.  02/02/17   Hongalgi, Lenis Dickinson, MD  insulin NPH Human (NOVOLIN N RELION) 100 UNIT/ML injection Inject 1.7 mLs (170 Units total) into the skin every morning. And syringes 1/day 04/30/18   Renato Shin, MD  Insulin Syringe-Needle U-100 (INSULIN SYRINGE 1CC/30GX5/16") 30G X 5/16" 1 ML MISC Use as directed for insulin injections 5 times daily 01/29/16   Burns, Claudina Lick, MD  ipratropium-albuterol (DUONEB) 0.5-2.5 (3) MG/3ML SOLN Take 3 mLs by nebulization every 6 (six) hours as needed (Wheezing or dyspnea.). 02/02/17   Hongalgi, Lenis Dickinson, MD  levothyroxine (SYNTHROID, LEVOTHROID) 175 MCG tablet TAKE 1 TABLET BY MOUTH SIX DAYS A WEEK BEFORE BREAKFAST 02/16/18   Burns, Claudina Lick, MD  Lidocaine HCl (ASPERCREME LIDOCAINE) 4 % LIQD Apply 1 application topically as needed (pain).     [provider]  meloxicam (MOBIC) 7.5 MG tablet Take 15 mg by mouth daily.  06/30/15   [provider]  metoprolol tartrate (LOPRESSOR) 25 MG tablet Take 1 tablet (25 mg total) by mouth 2 (two) times daily. 12/11/17   Barrett, Evelene Croon, PA-C  morphine (MS CONTIN) 30 MG 12 hr tablet Take 30 mg by mouth 2 (two) times daily. 09/12/15   [provider]  ondansetron (ZOFRAN-ODT) 4 MG disintegrating tablet Take 1 tablet (4 mg total) by mouth every 6 (six) hours as needed for nausea or vomiting. 04/20/18   [name], MD  potassium chloride SA (KLOR-CON M20) 20 MEQ tablet TAKE 2 TABLETS EVERY DAY AS NEEDED FOR CRAMPING 11/12/17   Burns, Claudina Lick, MD  tiZANidine (ZANAFLEX) 2 MG tablet Take 2 mg by mouth as needed for muscle spasms.  05/30/15   [provider]    Family History Family History  Problem Relation Age of Onset  . Diabetes Mother   . Heart disease Mother   . Dementia Mother   . Heart disease Father   . High blood pressure Father   . Colon cancer Maternal Uncle        x 2  . Breast cancer Other        great aunts x 5    Social History Social History   Tobacco Use  . Smoking status: Former Smoker    Packs/day: 2.50    Years: 39.00    Pack years: 97.50    Types: Cigarettes    Last attempt to quit: 10/22/2010    Years since quitting: 7.5  . Smokeless tobacco: Never Used  Substance Use Topics  . Alcohol use: No    Alcohol/week: 0.0 standard drinks  . Drug use: No     Allergies   Gabapentin; Losartan; Oxycodone; Sulfa antibiotics; and Sulfonamide derivatives   Review of Systems Review of Systems  All other systems reviewed and are negative.    Physical Exam Updated Vital Signs BP 133/68 (BP Location: Right Arm)   Pulse 75   Temp 98.5 F (36.9 C) (Oral)   Resp 16   SpO2 100%   Physical Exam  Constitutional: She is oriented to person, place, and time. She appears well-developed and well-nourished.  HENT:  Head: Normocephalic and atraumatic.  Right Ear: External ear normal.  Left Ear: External ear normal.  Nose: Nose normal.  Mouth/Throat: Oropharynx is clear and moist.  Eyes: Pupils are equal, round, and reactive to light. Conjunctivae and EOM are normal.  Neck:  Normal range of motion. Neck  supple.  Cardiovascular: Normal rate, regular rhythm, normal heart sounds and intact distal pulses.  Pulmonary/Chest: Effort normal and breath sounds normal.  Abdominal: Soft. Bowel sounds are normal.  Musculoskeletal: Normal range of motion.  Neurological: She is alert and oriented to person, place, and time.  Skin: Skin is warm. Capillary refill takes less than 2 seconds.  Psychiatric: She has a normal mood and affect. Her behavior is normal. Judgment and thought content normal.  Nursing note and vitals reviewed.    ED Treatments / Results  Labs (all labs ordered are listed, but only abnormal results are displayed) Labs Reviewed - No data to display  EKG None  Radiology No results found.  Procedures Procedures (including critical care time)  Medications Ordered in ED Medications - No data to display   Initial Impression / Assessment and Plan / ED Course  I have reviewed the triage vital signs and the nursing notes.  Pertinent labs & imaging results that were available during my care of the patient were reviewed by me and considered in my medical decision making (see chart for details).    While I was in another pt's room, pt left the ED.  She did not speak to anyone and she did not receive any treatment here other than initial vitals and my exam.  Her vitals are good and she is awake and alert.   She did not receive any d/c instructions.  Final Clinical Impressions(s) / ED Diagnoses   Final diagnoses:  Opiate overdose, accidental or unintentional, initial encounter [facility])    ED Discharge Orders    None       Isla Pence, MD 05/14/18 1210

## 2018-05-14 NOTE — ED Triage Notes (Signed)
Pt arrived via [facility] from [facility] dr office.   Pt takes 30mg  of morphine and 4mg  of dilaudid for chronic pain.  Pt believes she took too much. BP 82/40 at dr. Gabriel Carina took 4mg  of narcan intranasally at dr office prior to EMS arrival  Pt walked from EMS stretcher to room and then got up by herself to go to the restroom

## 2018-05-20 ENCOUNTER — Other Ambulatory Visit: Payer: Self-pay | Admitting: Internal Medicine

## 2018-05-20 NOTE — Telephone Encounter (Signed)
Pt is now seeing Loanne Drilling, and he is doing labs. Should RX go to him?

## 2018-05-21 ENCOUNTER — Ambulatory Visit: Payer: Medicare HMO | Admitting: Pulmonary Disease

## 2018-05-25 DIAGNOSIS — J9611 Chronic respiratory failure with hypoxia: Secondary | ICD-10-CM | POA: Diagnosis not present

## 2018-05-25 DIAGNOSIS — J449 Chronic obstructive pulmonary disease, unspecified: Secondary | ICD-10-CM | POA: Diagnosis not present

## 2018-05-25 DIAGNOSIS — G4733 Obstructive sleep apnea (adult) (pediatric): Secondary | ICD-10-CM | POA: Diagnosis not present

## 2018-05-25 DIAGNOSIS — E662 Morbid (severe) obesity with alveolar hypoventilation: Secondary | ICD-10-CM | POA: Diagnosis not present

## 2018-05-28 ENCOUNTER — Ambulatory Visit: Payer: Medicare HMO | Admitting: Pulmonary Disease

## 2018-05-29 ENCOUNTER — Ambulatory Visit: Payer: Medicare HMO | Admitting: Internal Medicine

## 2018-05-31 NOTE — Progress Notes (Signed)
Subjective:    Patient ID: Mary Acosta, female    DOB: 30-Aug-1955, 62 y.o.   MRN: [id number]  HPI The patient is here for follow up.  She has quit eating sugars.  She has been off of them for one month.  She feels with less sugars her pain level is better she wants to decrease the amount of pain medication she takes.  She also knows this is what she needs to do to get her sugars down.  She thinks her sugars are better.  B12 deficiency:  She has had B12 injections in the past - her last one was last year.  She wonders if she can have another one to give her energy.  She feels the injections did help her previously.  Kidney function ?/ back pain:  She has lower back pain that is intermittent.  There is nothing that makes the pain come or makes the pain worse.  If she moves around or lifts something the pain is not worse and therefore she does not feel it is musculoskeletal.  She is concerned about her kidneys.  Her urine has had no odor and she did have some dysuria at one point.  She also had some lower abdominal or pelvic pain.  She did have nausea and has intermittently.  She does go to the bathroom frequently, but that is not new.  She denies any blood in the urine.  Medications and allergies reviewed with patient and updated if appropriate.  Patient Active Problem List   Diagnosis Date Noted  . Narcotic overdose ([place]) 05/14/2018  . Thyroid cancer ([place]) 04/30/2018  . Obesity hypoventilation syndrome ([place]) 09/24/2017  . Palpitations 09/18/2017  . Diabetic neuropathy ([place]) 06/04/2017  . Vitamin B12 deficiency 03/05/2017  . Numbness and tingling in both hands 02/24/2017  . OSA (obstructive sleep apnea) 01/31/2017  . Liver cirrhosis secondary to NASH ([place]) 01/31/2017  . Hypothyroidism 01/31/2017  . Chronic narcotic use 01/31/2017  . Fibromyalgia 01/31/2017  . Hyperlipidemia 01/31/2017  . Gastroparesis   . Memory disorder 07/25/2016  . Pain in toe 04/09/2016  . Lump in  neck 01/29/2016  . Chronic diastolic (congestive) heart failure ([place]) 01/03/2016  . Fatty liver 01/03/2016  . Type 1 diabetes mellitus ([place]) 01/03/2016  . Recurrent Clostridium difficile diarrhea 01/03/2016  . Chronic respiratory failure ([place]) 11/10/2014  . Physical deconditioning 09/26/2014  . Dyspnea 09/26/2014  . Iron deficiency anemia, unspecified  04/05/2011  . Bariatric surgery status 04/05/2011  . Left arm pain   . UNSPECIFIED VITAMIN D DEFICIENCY 10/22/2007  . LOW BACK PAIN, CHRONIC 10/22/2007  . INSOMNIA 10/22/2007  . Obesity 10/14/2007  . Chronic pain syndrome 10/14/2007  . CARPAL TUNNEL SYNDROME 10/14/2007  . PERIPHERAL NEUROPATHY 10/14/2007  . ALLERGIC RHINITIS CAUSE UNSPECIFIED 10/14/2007  . ARTHRITIS 10/14/2007  . Anxiety state 08/25/2007  . Depression 08/25/2007  . Essential hypertension 08/25/2007  . ASTHMA 08/25/2007  . CONSTIPATION 08/25/2007  . POLYMYALGIA RHEUMATICA 08/25/2007  . LEG EDEMA, BILATERAL 08/25/2007    Current Outpatient Medications on File Prior to Visit  Medication Sig Dispense Refill  . albuterol (PROVENTIL HFA;VENTOLIN HFA) 108 (90 Base) MCG/ACT inhaler Inhale 2 puffs into the lungs every 6 (six) hours as needed for wheezing or shortness of breath. 1 Inhaler 11  . amitriptyline (ELAVIL) 100 MG tablet TAKE 1 TABLET BY MOUTH EVERY DAY AT NIGHT  3  . amLODipine (NORVASC) 5 MG tablet TAKE 1 TABLET BY MOUTH TWICE A DAY 180 tablet  1  . aspirin EC 81 MG tablet Take 1 tablet (81 mg total) by mouth daily. 90 tablet 3  . atorvastatin (LIPITOR) 40 MG tablet TAKE 1 TABLET BY MOUTH EVERY DAY 90 tablet 3  . busPIRone (BUSPAR) 7.5 MG tablet TAKE 1 TABLET (7.5 MG TOTAL) BY MOUTH 3 (THREE) TIMES DAILY AS NEEDED.  1  . furosemide (LASIX) 20 MG tablet Take 1 tablet (20 mg total) by mouth daily. 30 tablet 3  . hydrALAZINE (APRESOLINE) 50 MG tablet Take 1 tablet (50 mg total) by mouth 2 (two) times daily. 180 tablet 3  . HYDROmorphone (DILAUDID) 4 MG tablet Take  1 tablet (4 mg total) by mouth every 8 (eight) hours as needed for moderate pain. (Patient taking differently: Take 4 mg by mouth 3 (three) times daily. )    . insulin NPH Human (NOVOLIN N RELION) 100 UNIT/ML injection Inject 1.7 mLs (170 Units total) into the skin every morning. And syringes 1/day 60 mL 11  . Insulin Syringe-Needle U-100 (INSULIN SYRINGE 1CC/30GX5/16") 30G X 5/16" 1 ML MISC Use as directed for insulin injections 5 times daily 150 each 1  . ipratropium-albuterol (DUONEB) 0.5-2.5 (3) MG/3ML SOLN Take 3 mLs by nebulization every 6 (six) hours as needed (Wheezing or dyspnea.).    Marland Kitchen levothyroxine (SYNTHROID, LEVOTHROID) 175 MCG tablet TAKE 1 TABLET BY MOUTH SIX DAYS A WEEK BEFORE BREAKFAST 90 tablet 0  . Lidocaine HCl (ASPERCREME LIDOCAINE) 4 % LIQD Apply 1 application topically as needed (pain).     . meloxicam (MOBIC) 7.5 MG tablet Take 15 mg by mouth daily.   5  . metoprolol tartrate (LOPRESSOR) 25 MG tablet Take 1 tablet (25 mg total) by mouth 2 (two) times daily. 180 tablet 3  . morphine (MS CONTIN) 30 MG 12 hr tablet Take 30 mg by mouth 2 (two) times daily.    . ondansetron (ZOFRAN-ODT) 4 MG disintegrating tablet Take 1 tablet (4 mg total) by mouth every 6 (six) hours as needed for nausea or vomiting. 30 tablet 3  . potassium chloride SA (KLOR-CON M20) 20 MEQ tablet TAKE 2 TABLETS EVERY DAY AS NEEDED FOR CRAMPING 180 tablet 1  . tiZANidine (ZANAFLEX) 2 MG tablet Take 2 mg by mouth as needed for muscle spasms.   2   No current facility-administered medications on file prior to visit.     Past Medical History:  Diagnosis Date  . AKI (acute kidney injury) ([place]) 01/2017  . Anxiety    with panic attacks  . Arthritis    "back; feet; hands; shoulders" (08/26/2014)  . Asthma   . Cervical cancer ([place])   . Chronic lower back pain   . Chronic narcotic use   . Chronic pain syndrome    [facility]  . Cirrhosis ([place])   . Clostridium difficile infection 2017  . COPD  (chronic obstructive pulmonary disease) ([place])   . Daily headache   . Depression   . Diabetic neuropathy ([place]) 06/04/2017  . DJD (degenerative joint disease)   . Fatty liver disease, nonalcoholic   . Fibromyalgia   . Frequency of urination   . HCAP (healthcare-associated pneumonia) 08/26/2014  . History of TIA (transient ischemic attack) 11-01-2010   NO RESIDUAL  . Hyperlipidemia   . Hypertension   . Hypothyroidism   . IDDM (insulin dependent diabetes mellitus) ([place])   . Insomnia   . Lumbar stenosis   . Memory difficulty 07/25/2016  . Nocturia   . OSA (obstructive sleep apnea)  NO CPAP SINCE WT LOSS  . Osteoarthritis    with severe disease in knee  . Pneumonia "several times"  . Polymyalgia rheumatica ([place])   . Scoliosis   . Seasonal allergies   . Thyroid cancer ([place])   . Urgency of urination   . Vaginal pain S/P SLING  FEB 2012    Past Surgical History:  Procedure Laterality Date  . APPENDECTOMY  1982  . BREAST EXCISIONAL BIOPSY Left 02/28/2005   Atypical Ductal Hyperplasia  . CARDIAC CATHETERIZATION  09-04-2004   NORMAL CORONARY ANATOMY/ NORMAL LVF/ EF 60%  . CARDIOVASCULAR STRESS TEST  12-27-2010  DR [place]   ABNORMAL NUCLEAR STUDY W/ /MILD INFERIOR ISCHEMIA/ EF 69%/  CT HEART ANGIOGRAM ;  NO ACUTE FINDINGS  . CRYOABLATION  05/16/2003   w/LEEP FOR ABNORMAL PAP SMEAR  . CYSTOSCOPY  05/18/2012   Procedure: CYSTOSCOPY;  Surgeon: Reece Packer, MD;  Location: [facility];  Service: Urology;  Laterality: N/A;  examination under anethesia  . ESOPHAGOGASTRODUODENOSCOPY (EGD) WITH PROPOFOL N/A 09/03/2016   Procedure: ESOPHAGOGASTRODUODENOSCOPY (EGD) WITH PROPOFOL;  Surgeon: Doran Stabler, MD;  Location[facility];  Service: Gastroenterology;  Laterality: N/A;  . HYSTEROSCOPY W/D&C  08-19-2007   PMB  . KNEE ARTHROSCOPY W/ DEBRIDEMENT Left 03/29/2006   INTERNAL DERANGEMENT/ SEVERE DJD/ MENISCUS TEARS  . LAPAROSCOPIC CHOLECYSTECTOMY  06-10-2002  .  LAPAROSCOPIC GASTRIC BANDING  03/01/2006   TRUNCAL VAGOTOMY/ PLACEMENT OF VG BAND  . REVISION TOTAL KNEE ARTHROPLASTY Left 08-29-2008; 05/2009  . TONSILLECTOMY  1969  . TOTAL KNEE ARTHROPLASTY Left 01-23-2007   SEVERE DJD  . TOTAL THYROIDECTOMY  11-22-2005   BILATERAL THYROID NODULES-- PAPILLARY CARCINOMA (0.5CM)/ ADENOMATOID NODULES  . TRANSTHORACIC ECHOCARDIOGRAM  12-27-2010   LVSF NORMAL / EF 55-73%/ GRADE I DIASTOLIC DYSFUNCTION/ MILD MITRAL REGURG. / MILDLY DILATED LEFT ATRIUM/ MILDY INCREASED SYSTOLIC PRESSURE OF PULMONARY ARTERIES  . TRANSVAGINAL SUBURETERAL TAPE/ SLING  09-28-2010   MIXED URINARY INCONTINENCE  . TUBAL LIGATION  1983    Social History   Socioeconomic History  . Marital status: Married    Spouse name: Not on file  . Number of children: 2  . Years of education: 45  . Highest education level: Not on file  Occupational History  . Occupation: disabled    Fish farm manager: UNEMPLOYED  Social Needs  . Financial resource strain: Not on file  . Food insecurity:    Worry: Not on file    Inability: Not on file  . Transportation needs:    Medical: Not on file    Non-medical: Not on file  Tobacco Use  . Smoking status: Former Smoker    Packs/day: 2.50    Years: 39.00    Pack years: 97.50    Types: Cigarettes    Last attempt to quit: 10/22/2010    Years since quitting: 7.6  . Smokeless tobacco: Never Used  Substance and Sexual Activity  . Alcohol use: No    Alcohol/week: 0.0 standard drinks  . Drug use: No  . Sexual activity: Not Currently  Lifestyle  . Physical activity:    Days per week: Not on file    Minutes per session: Not on file  . Stress: Not on file  Relationships  . Social connections:    Talks on phone: Not on file    Gets together: Not on file    Attends religious service: Not on file    Active member of club or organization: Not on file  Attends meetings of clubs or organizations: Not on file    Relationship status: Not on file  Other Topics  Concern  . Not on file  Social History Narrative   Lives at home w/ her husband and grandson   Right-handed   Caffeine: 1 cup of coffee per week + [employer]    Family History  Problem Relation Age of Onset  . Diabetes Mother   . Heart disease Mother   . Dementia Mother   . Heart disease Father   . High blood pressure Father   . Colon cancer Maternal Uncle        x 2  . Breast cancer Other        great aunts x 5    Review of Systems  Constitutional: Negative for chills and fever.  Gastrointestinal: Positive for nausea (occ).  Genitourinary: Positive for dysuria (intermittent), frequency and pelvic pain (yesterday). Negative for hematuria.       No urine discoloration;  There is a odor  Musculoskeletal: Positive for back pain (lower back - no radiation).       Objective:   Vitals:   06/01/18 1459  BP: 124/80  Pulse: 60  Resp: 16  Temp: 98.1 F (36.7 C)  SpO2: 94%   BP Readings from Last 3 Encounters:  06/01/18 124/80  05/14/18 133/68  05/14/18 110/82   Wt Readings from Last 3 Encounters:  06/01/18 293 lb (132.9 kg)  05/14/18 292 lb (132.5 kg)  04/30/18 (!) 303 lb 3.2 oz (137.5 kg)   Body mass index is 44.55 kg/m.   Physical Exam    Constitutional: Appears well-developed and well-nourished. No distress.  HENT:  Head: Normocephalic and atraumatic.  Neck: Neck supple. No tracheal deviation present. No thyromegaly present.  No cervical lymphadenopathy Cardiovascular: Normal rate, regular rhythm and normal heart sounds.   No murmur heard. No carotid bruit .  No edema, but legs with hyperpigmentation and shiny Pulmonary/Chest: Effort normal and breath sounds normal. No respiratory distress. No has no wheezes. No rales.    Abdomen: Obese, soft, nontender, nondistended Musculoskeletal: Minimal lumbar spine tenderness with palpation, which is chronic, no pain across lower back with palpation Skin: Skin is warm and dry. Not diaphoretic.  Psychiatric: Normal mood and  affect. Behavior is normal.      Assessment & Plan:    See Problem List for Assessment and Plan of chronic medical problems.

## 2018-06-01 ENCOUNTER — Ambulatory Visit (INDEPENDENT_AMBULATORY_CARE_PROVIDER_SITE_OTHER): Payer: Medicare HMO | Admitting: Internal Medicine

## 2018-06-01 ENCOUNTER — Encounter: Payer: Self-pay | Admitting: Internal Medicine

## 2018-06-01 ENCOUNTER — Other Ambulatory Visit (INDEPENDENT_AMBULATORY_CARE_PROVIDER_SITE_OTHER): Payer: Medicare HMO

## 2018-06-01 VITALS — BP 124/80 | HR 60 | Temp 98.1°F | Resp 16 | Ht 68.0 in | Wt 293.0 lb

## 2018-06-01 DIAGNOSIS — M545 Low back pain, unspecified: Secondary | ICD-10-CM

## 2018-06-01 DIAGNOSIS — E538 Deficiency of other specified B group vitamins: Secondary | ICD-10-CM

## 2018-06-01 DIAGNOSIS — R350 Frequency of micturition: Secondary | ICD-10-CM | POA: Diagnosis not present

## 2018-06-01 DIAGNOSIS — E1042 Type 1 diabetes mellitus with diabetic polyneuropathy: Secondary | ICD-10-CM

## 2018-06-01 LAB — URINALYSIS, ROUTINE W REFLEX MICROSCOPIC
Hgb urine dipstick: NEGATIVE
KETONES UR: NEGATIVE
LEUKOCYTES UA: NEGATIVE
NITRITE: NEGATIVE
Specific Gravity, Urine: >=1.03 — AB (ref 1.000–1.030)
Total Protein, Urine: NEGATIVE
UROBILINOGEN UA: 1 (ref 0.0–1.0)
Urine Glucose: NEGATIVE
pH: 5.5 (ref 5.0–8.0)

## 2018-06-01 LAB — COMPREHENSIVE METABOLIC PANEL
ALT: 11 U/L (ref 0–35)
AST: 19 U/L (ref 0–37)
Albumin: 4 g/dL (ref 3.5–5.2)
Alkaline Phosphatase: 90 U/L (ref 39–117)
BUN: 15 mg/dL (ref 6–23)
CHLORIDE: 98 meq/L (ref 96–112)
CO2: 32 meq/L (ref 19–32)
CREATININE: 0.99 mg/dL (ref 0.40–1.20)
Calcium: 9.1 mg/dL (ref 8.4–10.5)
GFR: 60.42 mL/min (ref >60.00)
Glucose, Bld: 110 mg/dL — ABNORMAL HIGH (ref 70–99)
Potassium: 4.2 mEq/L (ref 3.5–5.1)
SODIUM: 137 meq/L (ref 135–145)
Total Bilirubin: 0.6 mg/dL (ref 0.2–1.2)
Total Protein: 7.2 g/dL (ref 6.0–8.3)

## 2018-06-01 LAB — HEMOGLOBIN A1C: Hgb A1c MFr Bld: 9.9 % — ABNORMAL HIGH (ref 4.6–6.5)

## 2018-06-01 LAB — VITAMIN B12: Vitamin B-12: 300 pg/mL (ref 211–911)

## 2018-06-01 NOTE — Assessment & Plan Note (Signed)
Has been compliant with a diabetic diet-has decreased/eliminated almost all of her sugars intake Management per endocrine Will check A1c since we are getting blood work done Encouraged her to continue avoiding sugars and carbohydrates and to work on weight loss

## 2018-06-01 NOTE — Assessment & Plan Note (Signed)
She does have chronic lower back pain, but this pain she has been experiencing recently is different.  Is an intermittent pain across her lower back does not seem to be related to movement or activity She is very concerned about her kidneys She has had some urinary symptoms so we also need to rule out a UTI CMP, urinalysis, urine culture

## 2018-06-01 NOTE — Assessment & Plan Note (Signed)
Has been diagnosed with B12 deficiency in the past and has been started on B12 injections, but did not come in a monthly basis Recheck B12 level and will restart B12 injections monthly if needed

## 2018-06-01 NOTE — Patient Instructions (Signed)
  Tests ordered today. Your results will be released to MyChart (or called to you) after review, usually within 72hours after test completion. If any changes need to be made, you will be notified at that same time.  Medications reviewed and updated.  Changes include :   none     

## 2018-06-02 LAB — URINE CULTURE
MICRO NUMBER: 91202288
SPECIMEN QUALITY:: ADEQUATE

## 2018-06-03 ENCOUNTER — Encounter: Payer: Self-pay | Admitting: Internal Medicine

## 2018-06-05 ENCOUNTER — Ambulatory Visit (INDEPENDENT_AMBULATORY_CARE_PROVIDER_SITE_OTHER): Payer: Medicare HMO | Admitting: Emergency Medicine

## 2018-06-05 DIAGNOSIS — E538 Deficiency of other specified B group vitamins: Secondary | ICD-10-CM | POA: Diagnosis not present

## 2018-06-05 MED ORDER — CYANOCOBALAMIN 1000 MCG/ML IJ SOLN
1000.0000 ug | Freq: Once | INTRAMUSCULAR | Status: AC
  Administered 2018-06-05: 1000 ug via INTRAMUSCULAR

## 2018-06-05 NOTE — Progress Notes (Signed)
b12 Injection given.   Mary Acosta [name], MD  

## 2018-06-11 DIAGNOSIS — J449 Chronic obstructive pulmonary disease, unspecified: Secondary | ICD-10-CM | POA: Diagnosis not present

## 2018-06-17 ENCOUNTER — Ambulatory Visit: Payer: Medicare HMO | Admitting: Adult Health

## 2018-06-18 ENCOUNTER — Encounter: Payer: Self-pay | Admitting: Adult Health

## 2018-06-19 DIAGNOSIS — M1711 Unilateral primary osteoarthritis, right knee: Secondary | ICD-10-CM | POA: Diagnosis not present

## 2018-06-20 DIAGNOSIS — E119 Type 2 diabetes mellitus without complications: Secondary | ICD-10-CM | POA: Diagnosis not present

## 2018-07-02 ENCOUNTER — Telehealth: Payer: Self-pay | Admitting: Pulmonary Disease

## 2018-07-02 NOTE — Telephone Encounter (Signed)
Spoke with patient in regards to her appointment with Wyn Quaker on 07/08/18 at 11am. Patient stated that she was turned in her CPAP machine to [facility] due to her not using it. She tried the recommendations that RA suggested back in August and she was still unhappy with the machine. Because she is not using the machine, she wanted to cancel her appointment. Advised patient that I would cancel the appointment and let RA know.   Nothing further needed at time of call.

## 2018-07-08 ENCOUNTER — Ambulatory Visit: Payer: Medicare HMO | Admitting: Pulmonary Disease

## 2018-07-10 ENCOUNTER — Encounter: Payer: Self-pay | Admitting: Internal Medicine

## 2018-07-12 DIAGNOSIS — J449 Chronic obstructive pulmonary disease, unspecified: Secondary | ICD-10-CM | POA: Diagnosis not present

## 2018-07-29 DIAGNOSIS — M533 Sacrococcygeal disorders, not elsewhere classified: Secondary | ICD-10-CM | POA: Diagnosis not present

## 2018-07-29 DIAGNOSIS — M5137 Other intervertebral disc degeneration, lumbosacral region: Secondary | ICD-10-CM | POA: Diagnosis not present

## 2018-07-29 DIAGNOSIS — G894 Chronic pain syndrome: Secondary | ICD-10-CM | POA: Diagnosis not present

## 2018-07-29 DIAGNOSIS — Z5181 Encounter for therapeutic drug level monitoring: Secondary | ICD-10-CM | POA: Diagnosis not present

## 2018-07-29 DIAGNOSIS — Z0289 Encounter for other administrative examinations: Secondary | ICD-10-CM | POA: Diagnosis not present

## 2018-07-31 ENCOUNTER — Other Ambulatory Visit: Payer: Self-pay | Admitting: Neurology

## 2018-08-06 ENCOUNTER — Other Ambulatory Visit: Payer: Self-pay | Admitting: Internal Medicine

## 2018-08-07 DIAGNOSIS — J449 Chronic obstructive pulmonary disease, unspecified: Secondary | ICD-10-CM | POA: Diagnosis not present

## 2018-08-11 DIAGNOSIS — J449 Chronic obstructive pulmonary disease, unspecified: Secondary | ICD-10-CM | POA: Diagnosis not present

## 2018-08-18 ENCOUNTER — Other Ambulatory Visit: Payer: Self-pay | Admitting: Internal Medicine

## 2018-08-21 ENCOUNTER — Telehealth: Payer: Self-pay | Admitting: Emergency Medicine

## 2018-08-21 NOTE — Telephone Encounter (Signed)
Copied from [place] [id number]. Topic: General - Other >> Aug 20, 2018 10:36 AM Windy Kalata wrote: Reason for CRM: Patient would like to know if she can get lab orders put in to test for arthritis, please advise  Best call back is [phone]

## 2018-08-23 NOTE — Telephone Encounter (Signed)
She has an appt coming up the end of Jan - we can discuss then so I know what to order --- ok to move up appt if she wishes.

## 2018-08-24 NOTE — Telephone Encounter (Signed)
Appointment has been moved up. Wants to know if something could be sent in for joint pain in the mean time. She states he joints feeling swollen and full of fluid. I advised she may have to wait until she is evaluated but I will forward request.

## 2018-08-24 NOTE — Telephone Encounter (Signed)
LVM letting pt know response below.  

## 2018-08-24 NOTE — Telephone Encounter (Signed)
She is already on two different pain medications and an anti-inflammatory.  She needs to be evaluated first

## 2018-08-30 ENCOUNTER — Other Ambulatory Visit: Payer: Self-pay | Admitting: Cardiology

## 2018-09-02 DIAGNOSIS — J449 Chronic obstructive pulmonary disease, unspecified: Secondary | ICD-10-CM | POA: Diagnosis not present

## 2018-09-03 NOTE — Patient Instructions (Addendum)
  Tests ordered today. Your results will be released to [place] (or called to you) after review, usually within 72hours after test completion. If any changes need to be made, you will be notified at that same time.  Medications reviewed and updated.  Changes include :   none    A referral was ordered for nutrition  Please followup in 6 months

## 2018-09-03 NOTE — Progress Notes (Signed)
Subjective:    Patient ID: Mary Acosta, female    DOB: 02-Jul-1956, 63 y.o.   MRN: [id number]  HPI The patient is here for follow up.  Since September she has not eaten any sugar and she has decreased her carbs significantly.  Her stomach stays bloated all the time.  She does have gastroparesis.  She takes beano and lactaid and it has not helped.     She is taking less dilaudid.    Joint pain, h/o PMR:  She has chronic pain and wants to get off her pain medication.  She wonders if she has something autoimmune where a different type of medication would help so she could get off the pain meds.  She has b/l knee, elbows, shoulders, SI, hands.      HFpEF, Hypertension: She is taking her medication daily. She is compliant with a low sodium diet.  She denies chest pain, palpitations, edema, shortness of breath and regular headaches. She is not exercising regularly.  She does not monitor her blood pressure at home.    Hyperlipidemia: She is taking her medication daily. She is compliant with a low fat/cholesterol diet. She is exercising regularly. She denies myalgias.   B12 def:  Her B12 level is on the low side.  She is getting monthly B12 injections.   Diabetes: She is taking her medication daily as prescribed. She is compliant with a diabetic diet. She is not exercising regularly. She monitors her sugars and they have been running 100-300's. She checks her feet daily and denies foot lesions. She is up-to-date with an ophthalmology examination.   Hypothyroidism:  She is taking her medication daily.  She denies any recent changes in energy or weight that are unexplained.   Insomnia, OSA, obesity hypoventilation syndrome, chronic resp failure:    Medications and allergies reviewed with patient and updated if appropriate.  Patient Active Problem List   Diagnosis Date Noted  . Narcotic overdose ([place]) 05/14/2018  . Thyroid cancer ([place]) 04/30/2018  . Obesity hypoventilation syndrome  ([place]) 09/24/2017  . Palpitations 09/18/2017  . Diabetic neuropathy ([place]) 06/04/2017  . Vitamin B12 deficiency 03/05/2017  . Numbness and tingling in both hands 02/24/2017  . OSA (obstructive sleep apnea) 01/31/2017  . Liver cirrhosis secondary to NASH ([place]) 01/31/2017  . Hypothyroidism 01/31/2017  . Chronic narcotic use 01/31/2017  . Fibromyalgia 01/31/2017  . Hyperlipidemia 01/31/2017  . Gastroparesis   . Memory disorder 07/25/2016  . Lump in neck 01/29/2016  . Chronic diastolic (congestive) heart failure ([place]) 01/03/2016  . Fatty liver 01/03/2016  . Type 1 diabetes mellitus ([place]) 01/03/2016  . Recurrent Clostridium difficile diarrhea 01/03/2016  . Chronic respiratory failure ([place]) 11/10/2014  . Physical deconditioning 09/26/2014  . Dyspnea 09/26/2014  . Iron deficiency anemia, unspecified  04/05/2011  . Bariatric surgery status 04/05/2011  . Left arm pain   . UNSPECIFIED VITAMIN D DEFICIENCY 10/22/2007  . LOW BACK PAIN, CHRONIC 10/22/2007  . INSOMNIA 10/22/2007  . Obesity 10/14/2007  . Chronic pain syndrome 10/14/2007  . CARPAL TUNNEL SYNDROME 10/14/2007  . PERIPHERAL NEUROPATHY 10/14/2007  . ALLERGIC RHINITIS CAUSE UNSPECIFIED 10/14/2007  . ARTHRITIS 10/14/2007  . Anxiety state 08/25/2007  . Depression 08/25/2007  . Essential hypertension 08/25/2007  . ASTHMA 08/25/2007  . CONSTIPATION 08/25/2007  . POLYMYALGIA RHEUMATICA 08/25/2007  . LEG EDEMA, BILATERAL 08/25/2007    Current Outpatient Medications on File Prior to Visit  Medication Sig Dispense Refill  . albuterol (PROVENTIL HFA;VENTOLIN HFA) 108 (90  Base) MCG/ACT inhaler Inhale 2 puffs into the lungs every 6 (six) hours as needed for wheezing or shortness of breath. 1 Inhaler 11  . amitriptyline (ELAVIL) 100 MG tablet TAKE 1 TABLET BY MOUTH EVERY DAY AT NIGHT  3  . amLODipine (NORVASC) 5 MG tablet TAKE 1 TABLET BY MOUTH TWICE A DAY 180 tablet 1  . aspirin EC 81 MG tablet Take 1 tablet (81 mg total) by mouth  daily. 90 tablet 3  . atorvastatin (LIPITOR) 40 MG tablet TAKE 1 TABLET BY MOUTH EVERY DAY 90 tablet 3  . busPIRone (BUSPAR) 7.5 MG tablet TAKE 1 TABLET (7.5 MG TOTAL) BY MOUTH 3 (THREE) TIMES DAILY AS NEEDED.  1  . furosemide (LASIX) 20 MG tablet TAKE 1 TABLET BY MOUTH EVERY DAY 90 tablet 2  . hydrALAZINE (APRESOLINE) 50 MG tablet Take 1 tablet (50 mg total) by mouth 2 (two) times daily. 180 tablet 3  . HYDROmorphone (DILAUDID) 4 MG tablet Take 1 tablet (4 mg total) by mouth every 8 (eight) hours as needed for moderate pain. (Patient taking differently: Take 2 mg by mouth 3 (three) times daily. )    . insulin NPH Human (NOVOLIN N RELION) 100 UNIT/ML injection Inject 1.7 mLs (170 Units total) into the skin every morning. And syringes 1/day 60 mL 11  . Insulin Syringe-Needle U-100 (INSULIN SYRINGE 1CC/30GX5/16") 30G X 5/16" 1 ML MISC Use as directed for insulin injections 5 times daily 150 each 1  . ipratropium-albuterol (DUONEB) 0.5-2.5 (3) MG/3ML SOLN Take 3 mLs by nebulization every 6 (six) hours as needed (Wheezing or dyspnea.).    Marland Kitchen levothyroxine (SYNTHROID, LEVOTHROID) 175 MCG tablet TAKE 1 TABLET BY MOUTH SIX DAYS A WEEK BEFORE BREAKFAST 90 tablet 0  . Lidocaine HCl (ASPERCREME LIDOCAINE) 4 % LIQD Apply 1 application topically as needed (pain).     . meloxicam (MOBIC) 7.5 MG tablet Take 15 mg by mouth daily.   5  . metoprolol tartrate (LOPRESSOR) 25 MG tablet Take 1 tablet (25 mg total) by mouth 2 (two) times daily. 180 tablet 3  . morphine (MS CONTIN) 30 MG 12 hr tablet Take 30 mg by mouth 2 (two) times daily.    . ondansetron (ZOFRAN-ODT) 4 MG disintegrating tablet Take 1 tablet (4 mg total) by mouth every 6 (six) hours as needed for nausea or vomiting. 30 tablet 3  . potassium chloride SA (KLOR-CON M20) 20 MEQ tablet TAKE 2 TABLETS EVERY DAY AS NEEDED FOR CRAMPING 180 tablet 1  . tiZANidine (ZANAFLEX) 2 MG tablet Take 2 mg by mouth as needed for muscle spasms.   2   No current  facility-administered medications on file prior to visit.     Past Medical History:  Diagnosis Date  . AKI (acute kidney injury) ([place]) 01/2017  . Anxiety    with panic attacks  . Arthritis    "back; feet; hands; shoulders" (08/26/2014)  . Asthma   . Cervical cancer ([place])   . Chronic lower back pain   . Chronic narcotic use   . Chronic pain syndrome    [facility]  . Cirrhosis ([place])   . Clostridium difficile infection 2017  . COPD (chronic obstructive pulmonary disease) ([place])   . Daily headache   . Depression   . Diabetic neuropathy ([place]) 06/04/2017  . DJD (degenerative joint disease)   . Fatty liver disease, nonalcoholic   . Fibromyalgia   . Frequency of urination   . HCAP (healthcare-associated pneumonia) 08/26/2014  .  History of TIA (transient ischemic attack) 11-01-2010   NO RESIDUAL  . Hyperlipidemia   . Hypertension   . Hypothyroidism   . IDDM (insulin dependent diabetes mellitus) ([place])   . Insomnia   . Lumbar stenosis   . Memory difficulty 07/25/2016  . Nocturia   . OSA (obstructive sleep apnea)    NO CPAP SINCE WT LOSS  . Osteoarthritis    with severe disease in knee  . Pneumonia "several times"  . Polymyalgia rheumatica ([place])   . Scoliosis   . Seasonal allergies   . Thyroid cancer ([place])   . Urgency of urination   . Vaginal pain S/P SLING  FEB 2012    Past Surgical History:  Procedure Laterality Date  . APPENDECTOMY  1982  . BREAST EXCISIONAL BIOPSY Left 02/28/2005   Atypical Ductal Hyperplasia  . CARDIAC CATHETERIZATION  09-04-2004   NORMAL CORONARY ANATOMY/ NORMAL LVF/ EF 60%  . CARDIOVASCULAR STRESS TEST  12-27-2010  DR [place]   ABNORMAL NUCLEAR STUDY W/ /MILD INFERIOR ISCHEMIA/ EF 69%/  CT HEART ANGIOGRAM ;  NO ACUTE FINDINGS  . CRYOABLATION  05/16/2003   w/LEEP FOR ABNORMAL PAP SMEAR  . CYSTOSCOPY  05/18/2012   Procedure: CYSTOSCOPY;  Surgeon: Reece Packer, MD;  Location[facility];  Service: Urology;   Laterality: N/A;  examination under anethesia  . ESOPHAGOGASTRODUODENOSCOPY (EGD) WITH PROPOFOL N/A 09/03/2016   Procedure: ESOPHAGOGASTRODUODENOSCOPY (EGD) WITH PROPOFOL;  Surgeon: Doran Stabler, MD;  Location: [facility];  Service: Gastroenterology;  Laterality: N/A;  . HYSTEROSCOPY W/D&C  08-19-2007   PMB  . KNEE ARTHROSCOPY W/ DEBRIDEMENT Left 03/29/2006   INTERNAL DERANGEMENT/ SEVERE DJD/ MENISCUS TEARS  . LAPAROSCOPIC CHOLECYSTECTOMY  06-10-2002  . LAPAROSCOPIC GASTRIC BANDING  03/01/2006   TRUNCAL VAGOTOMY/ PLACEMENT OF VG BAND  . REVISION TOTAL KNEE ARTHROPLASTY Left 08-29-2008; 05/2009  . TONSILLECTOMY  1969  . TOTAL KNEE ARTHROPLASTY Left 01-23-2007   SEVERE DJD  . TOTAL THYROIDECTOMY  11-22-2005   BILATERAL THYROID NODULES-- PAPILLARY CARCINOMA (0.5CM)/ ADENOMATOID NODULES  . TRANSTHORACIC ECHOCARDIOGRAM  12-27-2010   LVSF NORMAL / EF 32-12%/ GRADE I DIASTOLIC DYSFUNCTION/ MILD MITRAL REGURG. / MILDLY DILATED LEFT ATRIUM/ MILDY INCREASED SYSTOLIC PRESSURE OF PULMONARY ARTERIES  . TRANSVAGINAL SUBURETERAL TAPE/ SLING  09-28-2010   MIXED URINARY INCONTINENCE  . TUBAL LIGATION  1983    Social History   Socioeconomic History  . Marital status: Married    Spouse name: Not on file  . Number of children: 2  . Years of education: 71  . Highest education level: Not on file  Occupational History  . Occupation: disabled    Fish farm manager: UNEMPLOYED  Social Needs  . Financial resource strain: Not on file  . Food insecurity:    Worry: Not on file    Inability: Not on file  . Transportation needs:    Medical: Not on file    Non-medical: Not on file  Tobacco Use  . Smoking status: Former Smoker    Packs/day: 2.50    Years: 39.00    Pack years: 97.50    Types: Cigarettes    Last attempt to quit: 10/22/2010    Years since quitting: 7.8  . Smokeless tobacco: Never Used  Substance and Sexual Activity  . Alcohol use: No    Alcohol/week: 0.0 standard drinks  . Drug use: No  .  Sexual activity: Not Currently  Lifestyle  . Physical activity:    Days per week: Not on file  Minutes per session: Not on file  . Stress: Not on file  Relationships  . Social connections:    Talks on phone: Not on file    Gets together: Not on file    Attends religious service: Not on file    Active member of club or organization: Not on file    Attends meetings of clubs or organizations: Not on file    Relationship status: Not on file  Other Topics Concern  . Not on file  Social History Narrative   Lives at home w/ her husband and grandson   Right-handed   Caffeine: 1 cup of coffee per week + [employer]eps[employer]    Family History  Problem Relation Age of Onset  . Diabetes Mother   . Heart disease Mother   . Dementia Mother   . Heart disease Father   . High blood pressure Father   . Colon cancer Maternal Uncle        x 2  . Breast cancer Other        great aunts x 5    Review of Systems  Constitutional: Negative for chills and fever.  Eyes: Positive for visual disturbance (blurry).  Respiratory: Positive for shortness of breath. Negative for cough and wheezing.   Cardiovascular: Positive for leg swelling. Negative for chest pain and palpitations.  Gastrointestinal: Positive for abdominal distention (diffuse bloating), abdominal pain (LUQ), constipation and nausea (occ).       Burping a lot, no gerd  Musculoskeletal: Positive for arthralgias and back pain. Negative for myalgias.  Neurological: Negative for light-headedness and headaches.       Objective:   Vitals:   09/04/18 0915  BP: 126/78  Pulse: 79  Resp: 16  Temp: 98.2 F (36.8 C)  SpO2: 95%   BP Readings from Last 3 Encounters:  09/04/18 126/78  06/01/18 124/80  05/14/18 133/68   Wt Readings from Last 3 Encounters:  09/04/18 296 lb 12.8 oz (134.6 kg)  06/01/18 293 lb (132.9 kg)  05/14/18 292 lb (132.5 kg)   Body mass index is 45.13 kg/m.   Physical Exam    Constitutional: Appears well-developed and  well-nourished. No distress.  HENT:  Head: Normocephalic and atraumatic.  Neck: Neck supple. No tracheal deviation present. No thyromegaly present.  No cervical lymphadenopathy Cardiovascular: Normal rate, regular rhythm and normal heart sounds.   No murmur heard. No carotid bruit .  Mild b/l LE edema Pulmonary/Chest: Effort normal and breath sounds normal. No respiratory distress. Diffuse exp wheeze. No rales. Abdomen: Obese, soft, ND no tenderness today Skin: Skin is warm and dry. Not diaphoretic.  Psychiatric: Normal mood and affect. Behavior is normal.      Assessment & Plan:    See Problem List for Assessment and Plan of chronic medical problems.

## 2018-09-04 ENCOUNTER — Encounter: Payer: Self-pay | Admitting: Internal Medicine

## 2018-09-04 ENCOUNTER — Other Ambulatory Visit (INDEPENDENT_AMBULATORY_CARE_PROVIDER_SITE_OTHER): Payer: Medicare HMO

## 2018-09-04 ENCOUNTER — Ambulatory Visit (INDEPENDENT_AMBULATORY_CARE_PROVIDER_SITE_OTHER): Payer: Medicare HMO | Admitting: Internal Medicine

## 2018-09-04 VITALS — BP 126/78 | HR 79 | Temp 98.2°F | Resp 16 | Ht 68.0 in | Wt 296.8 lb

## 2018-09-04 DIAGNOSIS — E782 Mixed hyperlipidemia: Secondary | ICD-10-CM | POA: Diagnosis not present

## 2018-09-04 DIAGNOSIS — E1042 Type 1 diabetes mellitus with diabetic polyneuropathy: Secondary | ICD-10-CM

## 2018-09-04 DIAGNOSIS — E039 Hypothyroidism, unspecified: Secondary | ICD-10-CM

## 2018-09-04 DIAGNOSIS — E538 Deficiency of other specified B group vitamins: Secondary | ICD-10-CM

## 2018-09-04 DIAGNOSIS — I10 Essential (primary) hypertension: Secondary | ICD-10-CM

## 2018-09-04 DIAGNOSIS — M2550 Pain in unspecified joint: Secondary | ICD-10-CM

## 2018-09-04 DIAGNOSIS — I5032 Chronic diastolic (congestive) heart failure: Secondary | ICD-10-CM | POA: Diagnosis not present

## 2018-09-04 DIAGNOSIS — K3184 Gastroparesis: Secondary | ICD-10-CM

## 2018-09-04 LAB — LIPID PANEL
Cholesterol: 153 mg/dL (ref 0–200)
HDL: 25.6 mg/dL — ABNORMAL LOW (ref >39.00)
LDL Cholesterol: 97 mg/dL (ref 0–99)
NonHDL: 127.89
Total CHOL/HDL Ratio: 6
Triglycerides: 154 mg/dL — ABNORMAL HIGH (ref 0.0–149.0)
VLDL: 30.8 mg/dL (ref 0.0–40.0)

## 2018-09-04 LAB — COMPREHENSIVE METABOLIC PANEL
ALBUMIN: 3.8 g/dL (ref 3.5–5.2)
ALT: 20 U/L (ref 0–35)
AST: 28 U/L (ref 0–37)
Alkaline Phosphatase: 111 U/L (ref 39–117)
BUN: 12 mg/dL (ref 6–23)
CO2: 30 mEq/L (ref 19–32)
Calcium: 9.2 mg/dL (ref 8.4–10.5)
Chloride: 97 mEq/L (ref 96–112)
Creatinine, Ser: 0.94 mg/dL (ref 0.40–1.20)
GFR: 64.09 mL/min (ref >60.00)
Glucose, Bld: 300 mg/dL — ABNORMAL HIGH (ref 70–99)
Potassium: 4.9 mEq/L (ref 3.5–5.1)
Sodium: 135 mEq/L (ref 135–145)
Total Bilirubin: 0.5 mg/dL (ref 0.2–1.2)
Total Protein: 7 g/dL (ref 6.0–8.3)

## 2018-09-04 LAB — CBC WITH DIFFERENTIAL/PLATELET
Basophils Absolute: 0.1 10*3/uL (ref 0.0–0.1)
Basophils Relative: 0.8 % (ref 0.0–3.0)
EOS ABS: 0.2 10*3/uL (ref 0.0–0.7)
Eosinophils Relative: 3.2 % (ref 0.0–5.0)
HCT: 41.7 % (ref 36.0–46.0)
Hemoglobin: 13.2 g/dL (ref 12.0–15.0)
Lymphocytes Relative: 23.2 % (ref 12.0–46.0)
Lymphs Abs: 1.7 10*3/uL (ref 0.7–4.0)
MCHC: 31.7 g/dL (ref 30.0–36.0)
MCV: 76 fl — ABNORMAL LOW (ref 78.0–100.0)
MONO ABS: 0.4 10*3/uL (ref 0.1–1.0)
Monocytes Relative: 5.3 % (ref 3.0–12.0)
Neutro Abs: 4.8 10*3/uL (ref 1.4–7.7)
Neutrophils Relative %: 67.5 % (ref 43.0–77.0)
Platelets: 153 10*3/uL (ref 150.0–400.0)
RBC: 5.49 Mil/uL — ABNORMAL HIGH (ref 3.87–5.11)
RDW: 14.8 % (ref 11.5–15.5)
WBC: 7.2 10*3/uL (ref 4.0–10.5)

## 2018-09-04 LAB — C-REACTIVE PROTEIN: CRP: 1 mg/dL (ref 0.5–20.0)

## 2018-09-04 LAB — TSH: TSH: 0.55 u[IU]/mL (ref 0.35–4.50)

## 2018-09-04 LAB — HEMOGLOBIN A1C: Hgb A1c MFr Bld: 10.8 % — ABNORMAL HIGH (ref 4.6–6.5)

## 2018-09-04 LAB — SEDIMENTATION RATE: Sed Rate: 47 mm/hr — ABNORMAL HIGH (ref 0–30)

## 2018-09-04 NOTE — Assessment & Plan Note (Signed)
Has gastroparesis and that is likely the cause of some of her stomach bloating and discomfort Small frequent meals Working on weight loss and improving her diet We will see nutrition Working on decreasing her narcotic pain medication

## 2018-09-04 NOTE — Assessment & Plan Note (Signed)
Following with Dr. Loanne Drilling Will check TSH given slight weight gain

## 2018-09-04 NOTE — Assessment & Plan Note (Signed)
Check lipid panel  Continue daily statin Regular exercise and healthy diet encouraged  

## 2018-09-04 NOTE — Assessment & Plan Note (Signed)
Has fairly diffuse joint pain She does follow with pain management for her chronic back pain and is on narcotics She wonders if some of her joint pain is related to an autoimmune process-she really wants to get off the narcotics and has decreased the dose.  She wonders if she needs a different type of medication to help with her pain Will check autoimmune blood work We again discussed weight loss and how important that is, which she agrees-refer to nutrition and stressed the importance of decreasing calories

## 2018-09-04 NOTE — Assessment & Plan Note (Signed)
BP well controlled Current regimen effective and well tolerated Continue current medications at current doses cmp  

## 2018-09-04 NOTE — Assessment & Plan Note (Addendum)
She had made significant changes in her diet-no longer eating sugar and has decreased many of her carbohydrates, but is frustrated because her sugars are still very high and her weight is not decreasing Stressed decreasing her portions and caloric intake Will refer to nutrition Management of sugars per Dr. Loanne Drilling Check A1c

## 2018-09-04 NOTE — Assessment & Plan Note (Signed)
euvolemic on exam Continue current medications cmp

## 2018-09-04 NOTE — Assessment & Plan Note (Addendum)
B12 deficiency  monthly B12 injections

## 2018-09-07 LAB — ANA: Anti Nuclear Antibody(ANA): NEGATIVE

## 2018-09-07 LAB — CYCLIC CITRUL PEPTIDE ANTIBODY, IGG: Cyclic Citrullin Peptide Ab: <16 UNITS

## 2018-09-07 LAB — RHEUMATOID FACTOR: Rheumatoid fact SerPl-aCnc: <14 IU/mL (ref <14)

## 2018-09-09 ENCOUNTER — Telehealth: Payer: Self-pay | Admitting: Internal Medicine

## 2018-09-09 NOTE — Telephone Encounter (Signed)
Copied from [place] [id number]. Topic: Quick Communication - See Telephone Encounter >> Sep 09, 2018 11:18 AM Bea Graff, NT wrote: CRM for notification. See Telephone encounter for: 09/09/18. Pt would like a call to discuss her lab results.

## 2018-09-09 NOTE — Telephone Encounter (Signed)
Spoke with pt to advise Dr Quay Burow has not results the labs and they would be on MyChart once she resulted them.

## 2018-09-10 ENCOUNTER — Encounter: Payer: Self-pay | Admitting: Internal Medicine

## 2018-09-11 DIAGNOSIS — J449 Chronic obstructive pulmonary disease, unspecified: Secondary | ICD-10-CM | POA: Diagnosis not present

## 2018-09-14 ENCOUNTER — Encounter: Payer: Self-pay | Admitting: Internal Medicine

## 2018-09-18 ENCOUNTER — Ambulatory Visit: Payer: Medicare HMO | Admitting: *Deleted

## 2018-09-19 DIAGNOSIS — E119 Type 2 diabetes mellitus without complications: Secondary | ICD-10-CM | POA: Diagnosis not present

## 2018-09-21 ENCOUNTER — Other Ambulatory Visit: Payer: Self-pay | Admitting: Cardiology

## 2018-09-23 ENCOUNTER — Ambulatory Visit: Payer: Medicare HMO | Admitting: Internal Medicine

## 2018-09-25 ENCOUNTER — Telehealth: Payer: Self-pay | Admitting: Internal Medicine

## 2018-09-25 MED ORDER — FLUCONAZOLE 150 MG PO TABS: 150.0000 mg | ORAL_TABLET | Freq: Once | ORAL | 0 refills | Status: AC

## 2018-09-25 NOTE — Telephone Encounter (Signed)
Copied from [place] [id number]. Topic: General - Other >> Sep 25, 2018 10:49 AM Lennox Solders wrote: Reason for CRM: pt saw dr burns on 09/04/2018. Pt is having vaginal itching and burning for about a week and half.  Pt has tried monistat no relief. Pt would like diflucan . Pt has use diflucan in past. Pt has dm. [facility]

## 2018-09-25 NOTE — Telephone Encounter (Signed)
diflucan sent to [facility]  If she continues to have symptoms she will need to see a gyn because these symptoms can be other things besides a yeast infection

## 2018-09-25 NOTE — Telephone Encounter (Signed)
Pt aware of response below.  

## 2018-09-25 NOTE — Telephone Encounter (Signed)
Please advise 

## 2018-09-28 DIAGNOSIS — J449 Chronic obstructive pulmonary disease, unspecified: Secondary | ICD-10-CM | POA: Diagnosis not present

## 2018-10-07 ENCOUNTER — Telehealth: Payer: Self-pay | Admitting: Endocrinology

## 2018-10-07 NOTE — Telephone Encounter (Signed)
Patient stated that she can not afford the insulin that she is prescribed right now. She spoke with her insurance company and they advised patient to have Dr Loanne Drilling switch her to the [facility].  Patient also stated she would need the needles for this new insulin if it is needed.      [id number] - [place], [place] [address]

## 2018-10-07 NOTE — Telephone Encounter (Signed)
Pt is set for an appt tomorrow to discuss

## 2018-10-07 NOTE — Telephone Encounter (Signed)
Ov is due.  Let's address then 

## 2018-10-07 NOTE — Telephone Encounter (Signed)
Please advise 

## 2018-10-08 ENCOUNTER — Ambulatory Visit (INDEPENDENT_AMBULATORY_CARE_PROVIDER_SITE_OTHER): Payer: Medicare HMO | Admitting: Endocrinology

## 2018-10-08 ENCOUNTER — Encounter: Payer: Self-pay | Admitting: Endocrinology

## 2018-10-08 VITALS — BP 134/82 | HR 125 | Ht 68.0 in | Wt 294.8 lb

## 2018-10-08 DIAGNOSIS — E1042 Type 1 diabetes mellitus with diabetic polyneuropathy: Secondary | ICD-10-CM

## 2018-10-08 LAB — POCT GLYCOSYLATED HEMOGLOBIN (HGB A1C): Hemoglobin A1C: 10.5 % — AB (ref 4.0–5.6)

## 2018-10-08 MED ORDER — INSULIN DETEMIR 100 UNIT/ML ~~LOC~~ SOLN: 240.0000 [IU] | SUBCUTANEOUS | 11 refills | Status: DC

## 2018-10-08 NOTE — Progress Notes (Signed)
Subjective:    Patient ID: Mary Acosta, female    DOB: 06-11-1956, 63 y.o.   MRN: [id number]  HPI Pt returns for f/u of diabetes mellitus: DM type: 1 Dx'ed: 4132 Complications: polyneuropathy, gastroparesis, and retinopathy.  Therapy: insulin since 2011 GDM: never DKA: once, in 2015 Severe hypoglycemia: never Pancreatitis: never Other: she had gastric banding in 2008.  She then lost 148 lbs, but has since regained 106 of that; she takes human insulin, due to cost; she takes qd insulin, due to missing multiple daily injections   Interval history: no cbg record, but states cbg's vary from 260-400.  It is in general higher as the day goes on.  She says she never misses the insulin.  No new sxs.  She says levemir would be cheaper on her insurance.  Past Medical History:  Diagnosis Date  . AKI (acute kidney injury) ([place]) 01/2017  . Anxiety    with panic attacks  . Arthritis    "back; feet; hands; shoulders" (08/26/2014)  . Asthma   . Cervical cancer ([place])   . Chronic lower back pain   . Chronic narcotic use   . Chronic pain syndrome    [facility]  . Cirrhosis ([place])   . Clostridium difficile infection 2017  . COPD (chronic obstructive pulmonary disease) ([place])   . Daily headache   . Depression   . Diabetic neuropathy ([place]) 06/04/2017  . DJD (degenerative joint disease)   . Fatty liver disease, nonalcoholic   . Fibromyalgia   . Frequency of urination   . HCAP (healthcare-associated pneumonia) 08/26/2014  . History of TIA (transient ischemic attack) 11-01-2010   NO RESIDUAL  . Hyperlipidemia   . Hypertension   . Hypothyroidism   . IDDM (insulin dependent diabetes mellitus) ([place])   . Insomnia   . Lumbar stenosis   . Memory difficulty 07/25/2016  . Nocturia   . OSA (obstructive sleep apnea)    NO CPAP SINCE WT LOSS  . Osteoarthritis    with severe disease in knee  . Pneumonia "several times"  . Polymyalgia rheumatica ([place])   . Scoliosis   .  Seasonal allergies   . Thyroid cancer ([place])   . Urgency of urination   . Vaginal pain S/P SLING  FEB 2012    Past Surgical History:  Procedure Laterality Date  . APPENDECTOMY  1982  . BREAST EXCISIONAL BIOPSY Left 02/28/2005   Atypical Ductal Hyperplasia  . CARDIAC CATHETERIZATION  09-04-2004   NORMAL CORONARY ANATOMY/ NORMAL LVF/ EF 60%  . CARDIOVASCULAR STRESS TEST  12-27-2010  DR [place]   ABNORMAL NUCLEAR STUDY W/ /MILD INFERIOR ISCHEMIA/ EF 69%/  CT HEART ANGIOGRAM ;  NO ACUTE FINDINGS  . CRYOABLATION  05/16/2003   w/LEEP FOR ABNORMAL PAP SMEAR  . CYSTOSCOPY  05/18/2012   Procedure: CYSTOSCOPY;  Surgeon: Reece Packer, MD;  Location: [facility];  Service: Urology;  Laterality: N/A;  examination under anethesia  . ESOPHAGOGASTRODUODENOSCOPY (EGD) WITH PROPOFOL N/A 09/03/2016   Procedure: ESOPHAGOGASTRODUODENOSCOPY (EGD) WITH PROPOFOL;  Surgeon: Doran Stabler, MD;  Location: [facility];  Service: Gastroenterology;  Laterality: N/A;  . HYSTEROSCOPY W/D&C  08-19-2007   PMB  . KNEE ARTHROSCOPY W/ DEBRIDEMENT Left 03/29/2006   INTERNAL DERANGEMENT/ SEVERE DJD/ MENISCUS TEARS  . LAPAROSCOPIC CHOLECYSTECTOMY  06-10-2002  . LAPAROSCOPIC GASTRIC BANDING  03/01/2006   TRUNCAL VAGOTOMY/ PLACEMENT OF VG BAND  . REVISION TOTAL KNEE ARTHROPLASTY Left 08-29-2008; 05/2009  .  TONSILLECTOMY  1969  . TOTAL KNEE ARTHROPLASTY Left 01-23-2007   SEVERE DJD  . TOTAL THYROIDECTOMY  11-22-2005   BILATERAL THYROID NODULES-- PAPILLARY CARCINOMA (0.5CM)/ ADENOMATOID NODULES  . TRANSTHORACIC ECHOCARDIOGRAM  12-27-2010   LVSF NORMAL / EF 40-98%/ GRADE I DIASTOLIC DYSFUNCTION/ MILD MITRAL REGURG. / MILDLY DILATED LEFT ATRIUM/ MILDY INCREASED SYSTOLIC PRESSURE OF PULMONARY ARTERIES  . TRANSVAGINAL SUBURETERAL TAPE/ SLING  09-28-2010   MIXED URINARY INCONTINENCE  . TUBAL LIGATION  1983    Social History   Socioeconomic History  . Marital status: Married    Spouse name: Not on file    . Number of children: 2  . Years of education: 31  . Highest education level: Not on file  Occupational History  . Occupation: disabled    Fish farm manager: UNEMPLOYED  Social Needs  . Financial resource strain: Not on file  . Food insecurity:    Worry: Not on file    Inability: Not on file  . Transportation needs:    Medical: Not on file    Non-medical: Not on file  Tobacco Use  . Smoking status: Former Smoker    Packs/day: 2.50    Years: 39.00    Pack years: 97.50    Types: Cigarettes    Last attempt to quit: 10/22/2010    Years since quitting: 7.9  . Smokeless tobacco: Never Used  Substance and Sexual Activity  . Alcohol use: No    Alcohol/week: 0.0 standard drinks  . Drug use: No  . Sexual activity: Not Currently  Lifestyle  . Physical activity:    Days per week: Not on file    Minutes per session: Not on file  . Stress: Not on file  Relationships  . Social connections:    Talks on phone: Not on file    Gets together: Not on file    Attends religious service: Not on file    Active member of club or organization: Not on file    Attends meetings of clubs or organizations: Not on file    Relationship status: Not on file  . Intimate partner violence:    Fear of current or ex partner: Not on file    Emotionally abused: Not on file    Physically abused: Not on file    Forced sexual activity: Not on file  Other Topics Concern  . Not on file  Social History Narrative   Lives at home w/ her husband and grandson   Right-handed   Caffeine: 1 cup of coffee per week + [employer]    Current Outpatient Medications on File Prior to Visit  Medication Sig Dispense Refill  . albuterol (PROVENTIL HFA;VENTOLIN HFA) 108 (90 Base) MCG/ACT inhaler Inhale 2 puffs into the lungs every 6 (six) hours as needed for wheezing or shortness of breath. 1 Inhaler 11  . amitriptyline (ELAVIL) 100 MG tablet TAKE 1 TABLET BY MOUTH EVERY DAY AT NIGHT  3  . amLODipine (NORVASC) 5 MG tablet TAKE 1 TABLET BY  MOUTH TWICE A DAY 180 tablet 1  . aspirin EC 81 MG tablet Take 1 tablet (81 mg total) by mouth daily. 90 tablet 3  . atorvastatin (LIPITOR) 40 MG tablet TAKE 1 TABLET BY MOUTH EVERY DAY 30 tablet 0  . busPIRone (BUSPAR) 7.5 MG tablet TAKE 1 TABLET (7.5 MG TOTAL) BY MOUTH 3 (THREE) TIMES DAILY AS NEEDED.  1  . furosemide (LASIX) 20 MG tablet TAKE 1 TABLET BY MOUTH EVERY DAY 90 tablet 2  . hydrALAZINE (  APRESOLINE) 50 MG tablet Take 1 tablet (50 mg total) by mouth 2 (two) times daily. 180 tablet 3  . HYDROmorphone (DILAUDID) 4 MG tablet Take 1 tablet (4 mg total) by mouth every 8 (eight) hours as needed for moderate pain. (Patient taking differently: Take 2 mg by mouth 3 (three) times daily. )    . Insulin Syringe-Needle U-100 (INSULIN SYRINGE 1CC/30GX5/16") 30G X 5/16" 1 ML MISC Use as directed for insulin injections 5 times daily 150 each 1  . ipratropium-albuterol (DUONEB) 0.5-2.5 (3) MG/3ML SOLN Take 3 mLs by nebulization every 6 (six) hours as needed (Wheezing or dyspnea.).    Marland Kitchen levothyroxine (SYNTHROID, LEVOTHROID) 175 MCG tablet TAKE 1 TABLET BY MOUTH SIX DAYS A WEEK BEFORE BREAKFAST 90 tablet 0  . Lidocaine HCl (ASPERCREME LIDOCAINE) 4 % LIQD Apply 1 application topically as needed (pain).     . meloxicam (MOBIC) 7.5 MG tablet Take 15 mg by mouth daily.   5  . metoprolol tartrate (LOPRESSOR) 25 MG tablet Take 1 tablet (25 mg total) by mouth 2 (two) times daily. 180 tablet 3  . morphine (MS CONTIN) 30 MG 12 hr tablet Take 30 mg by mouth 2 (two) times daily.    . potassium chloride SA (KLOR-CON M20) 20 MEQ tablet TAKE 2 TABLETS EVERY DAY AS NEEDED FOR CRAMPING 180 tablet 1  . tiZANidine (ZANAFLEX) 2 MG tablet Take 2 mg by mouth as needed for muscle spasms.   2  . ondansetron (ZOFRAN-ODT) 4 MG disintegrating tablet Take 1 tablet (4 mg total) by mouth every 6 (six) hours as needed for nausea or vomiting. (Patient not taking: Reported on 10/08/2018) 30 tablet 3   No current facility-administered  medications on file prior to visit.     Allergies  Allergen Reactions  . Gabapentin Swelling    Swelling in legs Swelling in legs Swelling in legs  . Losartan Other (See Comments)    Myalgias and muscle cramping Other reaction(s): Other (See Comments) Myalgias and muscle cramping Myalgias and muscle cramping Other reaction(s): Other (See Comments) Myalgias and muscle cramping  . Oxycodone Itching  . Sulfa Antibiotics Nausea Only and Rash  . Sulfonamide Derivatives Nausea Only    Family History  Problem Relation Age of Onset  . Diabetes Mother   . Heart disease Mother   . Dementia Mother   . Heart disease Father   . High blood pressure Father   . Colon cancer Maternal Uncle        x 2  . Breast cancer Other        great aunts x 5    BP 134/82 (BP Location: Left Arm, Patient Position: Sitting, Cuff Size: Large)   Pulse (!) 125   Ht 5\' 8"  (1.727 m)   Wt 294 lb 12.8 oz (133.7 kg)   SpO2 90%   BMI 44.82 kg/m    Review of Systems She denies hypoglycemia.      Objective:   Physical Exam VITAL SIGNS:  See vs page GENERAL: no distress.  Morbid obesity Pulses: dorsalis pedis intact bilat.   MSK: no deformity of the feet, except for mild bilat hammer toes  CV: 1+ bilat leg edema, bilat vv's, and patchy hyperpigmentation of the legs.   Skin:  no ulcer on the feet.  normal color and temp on the feet. Neuro: sensation is intact to touch on the feet, but decreased from normal.   Ext: There is bilateral onychomycosis of the toenails.   Lab Results  Component Value Date   HGBA1C 10.5 (A) 10/08/2018    Lab Results  Component Value Date   CREATININE 0.94 09/04/2018   BUN 12 09/04/2018   NA 135 09/04/2018   K 4.9 09/04/2018   CL 97 09/04/2018   CO2 30 09/04/2018       Assessment & Plan:  Type 1 DM, with DR: poor glycemic control Missing insulin doses: she is not a candidate for multiple daily injections.  Edema: this limits rx options.     Patient  Instructions  Please change the NPH insulin to Levemir, 240 units each morning.   check your blood sugar twice a day.  vary the time of day when you check, between before the 3 meals, and at bedtime.  also check if you have symptoms of your blood sugar being too high or too low.  please keep a record of the readings and bring it to your next appointment here (or you can bring the meter itself).  You can write it on any piece of paper.  please call us sooner if your blood sugar goes below 70, or if you have a lot of readings over 200. Please come back for a follow-up appointment in 1 month.

## 2018-10-08 NOTE — Patient Instructions (Addendum)
Please change the NPH insulin to Levemir, 240 units each morning.   check your blood sugar twice a day.  vary the time of day when you check, between before the 3 meals, and at bedtime.  also check if you have symptoms of your blood sugar being too high or too low.  please keep a record of the readings and bring it to your next appointment here (or you can bring the meter itself).  You can write it on any piece of paper.  please call us sooner if your blood sugar goes below 70, or if you have a lot of readings over 200. Please come back for a follow-up appointment in 1 month.

## 2018-10-12 DIAGNOSIS — J449 Chronic obstructive pulmonary disease, unspecified: Secondary | ICD-10-CM | POA: Diagnosis not present

## 2018-10-17 ENCOUNTER — Other Ambulatory Visit: Payer: Self-pay | Admitting: Cardiology

## 2018-10-20 DIAGNOSIS — Z5181 Encounter for therapeutic drug level monitoring: Secondary | ICD-10-CM | POA: Diagnosis not present

## 2018-10-20 DIAGNOSIS — M5137 Other intervertebral disc degeneration, lumbosacral region: Secondary | ICD-10-CM | POA: Diagnosis not present

## 2018-10-20 DIAGNOSIS — G894 Chronic pain syndrome: Secondary | ICD-10-CM | POA: Diagnosis not present

## 2018-10-20 DIAGNOSIS — M533 Sacrococcygeal disorders, not elsewhere classified: Secondary | ICD-10-CM | POA: Diagnosis not present

## 2018-10-20 DIAGNOSIS — Z0289 Encounter for other administrative examinations: Secondary | ICD-10-CM | POA: Diagnosis not present

## 2018-10-21 ENCOUNTER — Other Ambulatory Visit: Payer: Self-pay

## 2018-10-21 ENCOUNTER — Telehealth: Payer: Self-pay | Admitting: Endocrinology

## 2018-10-21 ENCOUNTER — Encounter: Payer: Self-pay | Admitting: Internal Medicine

## 2018-10-21 ENCOUNTER — Ambulatory Visit (INDEPENDENT_AMBULATORY_CARE_PROVIDER_SITE_OTHER): Payer: Medicare HMO | Admitting: Internal Medicine

## 2018-10-21 VITALS — BP 160/72 | HR 75 | Temp 98.3°F | Resp 18 | Ht 68.0 in | Wt 294.0 lb

## 2018-10-21 DIAGNOSIS — E1042 Type 1 diabetes mellitus with diabetic polyneuropathy: Secondary | ICD-10-CM

## 2018-10-21 DIAGNOSIS — J209 Acute bronchitis, unspecified: Secondary | ICD-10-CM

## 2018-10-21 DIAGNOSIS — I10 Essential (primary) hypertension: Secondary | ICD-10-CM

## 2018-10-21 DIAGNOSIS — J4531 Mild persistent asthma with (acute) exacerbation: Secondary | ICD-10-CM | POA: Diagnosis not present

## 2018-10-21 DIAGNOSIS — J45909 Unspecified asthma, uncomplicated: Secondary | ICD-10-CM | POA: Insufficient documentation

## 2018-10-21 MED ORDER — CEFDINIR 300 MG PO CAPS: 300.0000 mg | ORAL_CAPSULE | Freq: Two times a day (BID) | ORAL | 0 refills | Status: DC

## 2018-10-21 MED ORDER — "INSULIN SYRINGE 30G X 5/16"" 1 ML MISC": 1 refills | Status: DC

## 2018-10-21 NOTE — Telephone Encounter (Signed)
Insulin Syringe-Needle U-100 (INSULIN SYRINGE 1CC/30GX5/16") 30G X 5/16" 1 ML MISC 150 each 1 10/21/2018    Sig: Use as directed for insulin injections 5 times daily   Sent to pharmacy as: Insulin Syringe-Needle U-100 (INSULIN SYRINGE 1CC/30GX5/16") 30G X 5/16" 1 ML Misc   E-Prescribing Status: Receipt confirmed by pharmacy (10/21/2018 10:03 AM EST)

## 2018-10-21 NOTE — Progress Notes (Signed)
Subjective:    Patient ID: Mary Acosta, female    DOB: 1956-06-12, 63 y.o.   MRN: [id number]  HPI She is here for an acute visit for cold symptoms.  Her symptoms started a few days ago.  Her daughter was sick and she was around her.  She is experiencing productive cough with discolored sputum, wheeze, shortness of breath, nasal congestion, sore throat.  She has not noticed any fevers, chills, ear pain and sinus pain.  She denies chest pain and headaches.  She has taken some over-the-counter cold medications, but has not been using her inhaler.  She is a strong history of bronchitis and is concerned this will turn into that.  Hypertension: She did not take her blood pressure medication today.  Yesterday her blood pressure medication was very low, which is why she did not take it today.  She is monitoring her blood pressure and it is low when she takes all of her medication and high if she does not take her medication.  He has not contacted anyone regarding this.  She denies headaches and chest pain.  Medications and allergies reviewed with patient and updated if appropriate.  Patient Active Problem List   Diagnosis Date Noted  . Arthralgia 09/04/2018  . Narcotic overdose ([place]) 05/14/2018  . Thyroid cancer ([place]) 04/30/2018  . Obesity hypoventilation syndrome ([place]) 09/24/2017  . Palpitations 09/18/2017  . Diabetic neuropathy ([place]) 06/04/2017  . Vitamin B12 deficiency 03/05/2017  . Numbness and tingling in both hands 02/24/2017  . OSA (obstructive sleep apnea) 01/31/2017  . Liver cirrhosis secondary to NASH ([place]) 01/31/2017  . Hypothyroidism 01/31/2017  . Chronic narcotic use 01/31/2017  . Fibromyalgia 01/31/2017  . Hyperlipidemia 01/31/2017  . Gastroparesis   . Memory disorder 07/25/2016  . Lump in neck 01/29/2016  . Chronic diastolic (congestive) heart failure ([place]) 01/03/2016  . Fatty liver 01/03/2016  . Type 1 diabetes mellitus ([place]) 01/03/2016  . Recurrent  Clostridium difficile diarrhea 01/03/2016  . Chronic respiratory failure ([place]) 11/10/2014  . Physical deconditioning 09/26/2014  . Dyspnea 09/26/2014  . Iron deficiency anemia, unspecified  04/05/2011  . Bariatric surgery status 04/05/2011  . Left arm pain   . UNSPECIFIED VITAMIN D DEFICIENCY 10/22/2007  . LOW BACK PAIN, CHRONIC 10/22/2007  . INSOMNIA 10/22/2007  . Obesity 10/14/2007  . Chronic pain syndrome 10/14/2007  . CARPAL TUNNEL SYNDROME 10/14/2007  . PERIPHERAL NEUROPATHY 10/14/2007  . ALLERGIC RHINITIS CAUSE UNSPECIFIED 10/14/2007  . ARTHRITIS 10/14/2007  . Anxiety state 08/25/2007  . Depression 08/25/2007  . Essential hypertension 08/25/2007  . ASTHMA 08/25/2007  . CONSTIPATION 08/25/2007  . POLYMYALGIA RHEUMATICA 08/25/2007  . LEG EDEMA, BILATERAL 08/25/2007    Current Outpatient Medications on File Prior to Visit  Medication Sig Dispense Refill  . albuterol (PROVENTIL HFA;VENTOLIN HFA) 108 (90 Base) MCG/ACT inhaler Inhale 2 puffs into the lungs every 6 (six) hours as needed for wheezing or shortness of breath. 1 Inhaler 11  . amitriptyline (ELAVIL) 100 MG tablet TAKE 1 TABLET BY MOUTH EVERY DAY AT NIGHT  3  . amLODipine (NORVASC) 5 MG tablet TAKE 1 TABLET BY MOUTH TWICE A DAY 180 tablet 1  . aspirin EC 81 MG tablet Take 1 tablet (81 mg total) by mouth daily. 90 tablet 3  . atorvastatin (LIPITOR) 40 MG tablet Take 1 tablet (40 mg total) by mouth daily. DUE FOR OV PLEASE CALL FOR APPT 30 tablet 0  . busPIRone (BUSPAR) 7.5 MG tablet TAKE 1 TABLET (7.5  MG TOTAL) BY MOUTH 3 (THREE) TIMES DAILY AS NEEDED.  1  . furosemide (LASIX) 20 MG tablet TAKE 1 TABLET BY MOUTH EVERY DAY 90 tablet 2  . hydrALAZINE (APRESOLINE) 50 MG tablet Take 1 tablet (50 mg total) by mouth 2 (two) times daily. 180 tablet 3  . HYDROmorphone (DILAUDID) 4 MG tablet Take 1 tablet (4 mg total) by mouth every 8 (eight) hours as needed for moderate pain. (Patient taking differently: Take 2 mg by mouth 3  (three) times daily. )    . insulin detemir (LEVEMIR) 100 UNIT/ML injection Inject 2.4 mLs (240 Units total) into the skin every morning. And 100-unit syringes 3/day. 80 mL 11  . ipratropium-albuterol (DUONEB) 0.5-2.5 (3) MG/3ML SOLN Take 3 mLs by nebulization every 6 (six) hours as needed (Wheezing or dyspnea.).    Marland Kitchen levothyroxine (SYNTHROID, LEVOTHROID) 175 MCG tablet TAKE 1 TABLET BY MOUTH SIX DAYS A WEEK BEFORE BREAKFAST 90 tablet 0  . Lidocaine HCl (ASPERCREME LIDOCAINE) 4 % LIQD Apply 1 application topically as needed (pain).     . meloxicam (MOBIC) 7.5 MG tablet Take 15 mg by mouth daily.   5  . metoprolol tartrate (LOPRESSOR) 25 MG tablet Take 1 tablet (25 mg total) by mouth 2 (two) times daily. 180 tablet 3  . morphine (MS CONTIN) 30 MG 12 hr tablet Take 30 mg by mouth 2 (two) times daily.    . ondansetron (ZOFRAN-ODT) 4 MG disintegrating tablet Take 1 tablet (4 mg total) by mouth every 6 (six) hours as needed for nausea or vomiting. 30 tablet 3  . potassium chloride SA (KLOR-CON M20) 20 MEQ tablet TAKE 2 TABLETS EVERY DAY AS NEEDED FOR CRAMPING 180 tablet 1  . tiZANidine (ZANAFLEX) 2 MG tablet Take 2 mg by mouth as needed for muscle spasms.   2   No current facility-administered medications on file prior to visit.     Past Medical History:  Diagnosis Date  . AKI (acute kidney injury) ([place]) 01/2017  . Anxiety    with panic attacks  . Arthritis    "back; feet; hands; shoulders" (08/26/2014)  . Asthma   . Cervical cancer ([place])   . Chronic lower back pain   . Chronic narcotic use   . Chronic pain syndrome    [facility]  . Cirrhosis ([place])   . Clostridium difficile infection 2017  . COPD (chronic obstructive pulmonary disease) ([place])   . Daily headache   . Depression   . Diabetic neuropathy ([place]) 06/04/2017  . DJD (degenerative joint disease)   . Fatty liver disease, nonalcoholic   . Fibromyalgia   . Frequency of urination   . HCAP (healthcare-associated  pneumonia) 08/26/2014  . History of TIA (transient ischemic attack) 11-01-2010   NO RESIDUAL  . Hyperlipidemia   . Hypertension   . Hypothyroidism   . IDDM (insulin dependent diabetes mellitus) ([facility])   . Insomnia   . Lumbar stenosis   . Memory difficulty 07/25/2016  . Nocturia   . OSA (obstructive sleep apnea)    NO CPAP SINCE WT LOSS  . Osteoarthritis    with severe disease in knee  . Pneumonia "several times"  . Polymyalgia rheumatica ([place])   . Scoliosis   . Seasonal allergies   . Thyroid cancer ([place])   . Urgency of urination   . Vaginal pain S/P SLING  FEB 2012    Past Surgical History:  Procedure Laterality Date  . APPENDECTOMY  1982  . BREAST  EXCISIONAL BIOPSY Left 02/28/2005   Atypical Ductal Hyperplasia  . CARDIAC CATHETERIZATION  09-04-2004   NORMAL CORONARY ANATOMY/ NORMAL LVF/ EF 60%  . CARDIOVASCULAR STRESS TEST  12-27-2010  DR [place]   ABNORMAL NUCLEAR STUDY W/ /MILD INFERIOR ISCHEMIA/ EF 69%/  CT HEART ANGIOGRAM ;  NO ACUTE FINDINGS  . CRYOABLATION  05/16/2003   w/LEEP FOR ABNORMAL PAP SMEAR  . CYSTOSCOPY  05/18/2012   Procedure: CYSTOSCOPY;  Surgeon: Reece Packer, MD;  Location: [facility];  Service: Urology;  Laterality: N/A;  examination under anethesia  . ESOPHAGOGASTRODUODENOSCOPY (EGD) WITH PROPOFOL N/A 09/03/2016   Procedure: ESOPHAGOGASTRODUODENOSCOPY (EGD) WITH PROPOFOL;  Surgeon: Doran Stabler, MD;  [facility];  Service: Gastroenterology;  Laterality: N/A;  . HYSTEROSCOPY W/D&C  08-19-2007   PMB  . KNEE ARTHROSCOPY W/ DEBRIDEMENT Left 03/29/2006   INTERNAL DERANGEMENT/ SEVERE DJD/ MENISCUS TEARS  . LAPAROSCOPIC CHOLECYSTECTOMY  06-10-2002  . LAPAROSCOPIC GASTRIC BANDING  03/01/2006   TRUNCAL VAGOTOMY/ PLACEMENT OF VG BAND  . REVISION TOTAL KNEE ARTHROPLASTY Left 08-29-2008; 05/2009  . TONSILLECTOMY  1969  . TOTAL KNEE ARTHROPLASTY Left 01-23-2007   SEVERE DJD  . TOTAL THYROIDECTOMY  11-22-2005   BILATERAL THYROID  NODULES-- PAPILLARY CARCINOMA (0.5CM)/ ADENOMATOID NODULES  . TRANSTHORACIC ECHOCARDIOGRAM  12-27-2010   LVSF NORMAL / EF 36-64%/ GRADE I DIASTOLIC DYSFUNCTION/ MILD MITRAL REGURG. / MILDLY DILATED LEFT ATRIUM/ MILDY INCREASED SYSTOLIC PRESSURE OF PULMONARY ARTERIES  . TRANSVAGINAL SUBURETERAL TAPE/ SLING  09-28-2010   MIXED URINARY INCONTINENCE  . TUBAL LIGATION  1983    Social History   Socioeconomic History  . Marital status: Married    Spouse name: Not on file  . Number of children: 2  . Years of education: 1  . Highest education level: Not on file  Occupational History  . Occupation: disabled    Fish farm manager: UNEMPLOYED  Social Needs  . Financial resource strain: Not on file  . Food insecurity:    Worry: Not on file    Inability: Not on file  . Transportation needs:    Medical: Not on file    Non-medical: Not on file  Tobacco Use  . Smoking status: Former Smoker    Packs/day: 2.50    Years: 39.00    Pack years: 97.50    Types: Cigarettes    Last attempt to quit: 10/22/2010    Years since quitting: 8.0  . Smokeless tobacco: Never Used  Substance and Sexual Activity  . Alcohol use: No    Alcohol/week: 0.0 standard drinks  . Drug use: No  . Sexual activity: Not Currently  Lifestyle  . Physical activity:    Days per week: Not on file    Minutes per session: Not on file  . Stress: Not on file  Relationships  . Social connections:    Talks on phone: Not on file    Gets together: Not on file    Attends religious service: Not on file    Active member of club or organization: Not on file    Attends meetings of clubs or organizations: Not on file    Relationship status: Not on file  Other Topics Concern  . Not on file  Social History Narrative   Lives at home w/ her husband and grandson   Right-handed   Caffeine: 1 cup of coffee per week + [employer]    Family History  Problem Relation Age of Onset  . Diabetes Mother   . Heart disease Mother   .  Dementia Mother     . Heart disease Father   . High blood pressure Father   . Colon cancer Maternal Uncle        x 2  . Breast cancer Other        great aunts x 5    Review of Systems  Constitutional: Negative for chills and fever.  HENT: Positive for congestion (? allergy related) and sore throat (last week only). Negative for ear pain and sinus pain.   Respiratory: Positive for cough, shortness of breath and wheezing.   Cardiovascular: Negative for chest pain.  Neurological: Positive for light-headedness (not new). Negative for headaches.       Objective:   Vitals:   10/21/18 1312  BP: (!) 160/72  Pulse: 75  Resp: 18  Temp: 98.3 F (36.8 C)  SpO2: 96%   Filed Weights   10/21/18 1312  Weight: 294 lb (133.4 kg)   Body mass index is 44.7 kg/m.  Wt Readings from Last 3 Encounters:  10/21/18 294 lb (133.4 kg)  10/08/18 294 lb 12.8 oz (133.7 kg)  09/04/18 296 lb 12.8 oz (134.6 kg)     Physical Exam GENERAL APPEARANCE: Appears stated age, well appearing, NAD EYES: conjunctiva clear, no icterus HEENT: bilateral tympanic membranes and ear canals normal, oropharynx with no erythema, no thyromegaly, trachea midline, no cervical or supraclavicular lymphadenopathy LUNGS: unlabored breathing, good air entry bilaterally, diffuse very mild expiratory wheeze at end expiration, no crackles CARDIOVASCULAR: Normal S1,S2 without murmurs, no edema SKIN: warm, dry        Assessment & Plan:   See Problem List for Assessment and Plan of chronic medical problems.

## 2018-10-21 NOTE — Patient Instructions (Addendum)
Stop the hydralazine for now.  Continue your other medications and take them daily.  Monitor your BP at record your numbers.  We can adjust your medication as needed.   Take the antibiotic for your cough.  Use the albuterol inhaler as needed.     Call if no improvement

## 2018-10-21 NOTE — Assessment & Plan Note (Signed)
She is not taking her medications consistently When she does take all of her medications her blood pressure is low and she does not take medication it is high Stressed that she needs to communicate her blood pressure readings with cardiology or myself so we can adjust her medication.  Discussed the importance of taking her medication on a daily basis We will discontinue hydralazine for now Continue amlodipine, furosemide and metoprolol at current dose-discussed that we may need to adjust this based on her readings at home, but that she needs to be taking her medications daily She will call with her readings

## 2018-10-21 NOTE — Telephone Encounter (Signed)
MEDICATION: Insulin Syringe-Needle U-100 (INSULIN SYRINGE 1CC/30GX5/16") 30G X 5/16" 1 ML MISC  PHARMACY:  [id number] - [place], [place] [phone] (Phone) [phone] (Fax)     IS THIS A 90 DAY SUPPLY : Yes if possible  IS PATIENT OUT OF MEDICATION: Yes  IF NOT; HOW MUCH IS LEFT: None  LAST APPOINTMENT DATE: @2 /13/2020  NEXT APPOINTMENT DATE:@3 /13/2020  DO WE HAVE YOUR PERMISSION TO LEAVE A DETAILED MESSAGE: Yes  OTHER COMMENTS:    **Let patient know to contact pharmacy at the end of the day to make sure medication is ready. **  ** Please notify patient to allow 48-72 hours to process**  **Encourage patient to contact the pharmacy for refills or they can request refills through [facility]**

## 2018-10-21 NOTE — Assessment & Plan Note (Signed)
Acute bronchitis with reactive airway disease in someone that does have chronic respiratory failure Concern for bacterial cause-start Omnicef twice daily x10 days Stop using albuterol inhaler regularly We will hold off on steroids at this point the symptoms do not improve Over-the-counter cold medications Call if no improvement

## 2018-10-21 NOTE — Assessment & Plan Note (Signed)
Secondary to acute bronchitis Has chronic respiratory failure Oxygenation adequate start Omnicef twice daily x10 days Stop using albuterol inhaler regularly We will hold off on steroids at this point the symptoms do not improve Over-the-counter cold medications Call if no improvement

## 2018-10-23 ENCOUNTER — Telehealth: Payer: Self-pay | Admitting: Endocrinology

## 2018-10-23 DIAGNOSIS — J449 Chronic obstructive pulmonary disease, unspecified: Secondary | ICD-10-CM | POA: Diagnosis not present

## 2018-10-23 MED ORDER — INSULIN NPH (HUMAN) (ISOPHANE) 100 UNIT/ML ~~LOC~~ SUSP: 190.0000 [IU] | SUBCUTANEOUS | 11 refills | Status: DC

## 2018-10-23 NOTE — Telephone Encounter (Signed)
Called pt and informed of new orders. Verbalized acceptance and understanding. 

## 2018-10-23 NOTE — Telephone Encounter (Signed)
Ok, I have sent a prescription to your pharmacy, to change to NPH, 190 units qam

## 2018-10-23 NOTE — Telephone Encounter (Signed)
Per [facility] "Caller states she needs [facility]."

## 2018-10-23 NOTE — Addendum Note (Signed)
Addended by: Renato Shin on: 10/23/2018 09:33 AM   Modules accepted: Orders

## 2018-10-23 NOTE — Telephone Encounter (Signed)
Please advise 

## 2018-11-06 ENCOUNTER — Ambulatory Visit: Payer: Medicare HMO | Admitting: Endocrinology

## 2018-11-09 ENCOUNTER — Other Ambulatory Visit: Payer: Self-pay | Admitting: Internal Medicine

## 2018-11-10 DIAGNOSIS — J449 Chronic obstructive pulmonary disease, unspecified: Secondary | ICD-10-CM | POA: Diagnosis not present

## 2018-11-12 ENCOUNTER — Encounter (INDEPENDENT_AMBULATORY_CARE_PROVIDER_SITE_OTHER): Payer: Medicare HMO

## 2018-11-13 ENCOUNTER — Other Ambulatory Visit: Payer: Self-pay | Admitting: Cardiology

## 2018-11-23 ENCOUNTER — Ambulatory Visit (INDEPENDENT_AMBULATORY_CARE_PROVIDER_SITE_OTHER): Payer: Medicare HMO | Admitting: Family Medicine

## 2018-11-23 DIAGNOSIS — J449 Chronic obstructive pulmonary disease, unspecified: Secondary | ICD-10-CM | POA: Diagnosis not present

## 2018-12-07 ENCOUNTER — Ambulatory Visit (INDEPENDENT_AMBULATORY_CARE_PROVIDER_SITE_OTHER): Payer: Medicare HMO | Admitting: Family Medicine

## 2018-12-11 DIAGNOSIS — J449 Chronic obstructive pulmonary disease, unspecified: Secondary | ICD-10-CM | POA: Diagnosis not present

## 2018-12-14 ENCOUNTER — Other Ambulatory Visit: Payer: Self-pay

## 2018-12-14 MED ORDER — METOPROLOL TARTRATE 25 MG PO TABS: 25.0000 mg | ORAL_TABLET | Freq: Two times a day (BID) | ORAL | 3 refills | Status: DC

## 2018-12-16 ENCOUNTER — Telehealth: Payer: Self-pay | Admitting: *Deleted

## 2018-12-16 NOTE — Telephone Encounter (Signed)
12/16/18  [facility]OM @ 3709, [facility] up appointment.

## 2018-12-18 DIAGNOSIS — J449 Chronic obstructive pulmonary disease, unspecified: Secondary | ICD-10-CM | POA: Diagnosis not present

## 2018-12-19 DIAGNOSIS — E119 Type 2 diabetes mellitus without complications: Secondary | ICD-10-CM | POA: Diagnosis not present

## 2019-01-03 ENCOUNTER — Encounter: Payer: Self-pay | Admitting: Internal Medicine

## 2019-01-04 ENCOUNTER — Encounter: Payer: Self-pay | Admitting: Internal Medicine

## 2019-01-04 ENCOUNTER — Ambulatory Visit (INDEPENDENT_AMBULATORY_CARE_PROVIDER_SITE_OTHER): Payer: Medicare HMO | Admitting: Internal Medicine

## 2019-01-04 DIAGNOSIS — F411 Generalized anxiety disorder: Secondary | ICD-10-CM | POA: Diagnosis not present

## 2019-01-04 DIAGNOSIS — I10 Essential (primary) hypertension: Secondary | ICD-10-CM

## 2019-01-04 DIAGNOSIS — F329 Major depressive disorder, single episode, unspecified: Secondary | ICD-10-CM

## 2019-01-04 DIAGNOSIS — F32A Depression, unspecified: Secondary | ICD-10-CM

## 2019-01-04 DIAGNOSIS — R69 Illness, unspecified: Secondary | ICD-10-CM | POA: Diagnosis not present

## 2019-01-04 DIAGNOSIS — R110 Nausea: Secondary | ICD-10-CM | POA: Diagnosis not present

## 2019-01-04 MED ORDER — PROMETHAZINE HCL 25 MG PO TABS: 25.0000 mg | ORAL_TABLET | Freq: Three times a day (TID) | ORAL | 0 refills | Status: DC | PRN

## 2019-01-04 NOTE — Telephone Encounter (Signed)
Set up a phone call with Dr. Quay Burow in regards.

## 2019-01-04 NOTE — Assessment & Plan Note (Signed)
Depression is not ideally controlled She is taking amitriptyline at night and therefore we are limited with what she can take She does not want to stop the amitriptyline She may need to go back to see psychiatry to change some of her medications

## 2019-01-04 NOTE — Assessment & Plan Note (Signed)
Having intermittent nausea very frequently Has talked to GI about this and has been taking Zofran as needed She is experiencing severe nausea at the end of our conversation she did start to throw up at which time we ended the call I did send her in some promethazine to her pharmacy as per her request, but advised she cannot take this regularly Advised that she follow-up with GI

## 2019-01-04 NOTE — Assessment & Plan Note (Signed)
No longer following with psychiatry She is taking amitriptyline, but no other medication She does take BuSpar sometimes, but states it does not do much She wondered about Xanax or clonazepam, which I do not think is a good idea with her other medications and I do not feel comfortable prescribing May need to go back to see psychiatry to help change some medications

## 2019-01-04 NOTE — Progress Notes (Signed)
Virtual Visit via Telephone Note  I connected with Mary Acosta on 01/04/19 at  2:30 PM EDT by a video enabled telemedicine application and verified that I am speaking with the correct person using two identifiers.   I discussed the limitations of evaluation and management by telemedicine and the availability of in person appointments. The patient expressed understanding and agreed to proceed.  The patient is currently at home and I am in the office.    No referring provider.    Cumulative time during 7-day interval 15 minutes, there was not an associated office visit for this concern within a 7 day period.  Names of all persons present for services: Binnie Rail, MD, Wilhemina Bonito    History of Present Illness: This is an acute visit for low BP  She has cpap, but can not wear it because she is claustrophobic.  She is having her machine back.  She has noticed that at night when she sleeps her blood pressure will go very high and she does think this is related to her sleep apnea.  The other night she woke up with her head throbbing and she felt her heartbeat in her eyes and her blood pressure was 200/100.  She had not taken her blood pressure that day.  She states even if she takes her blood pressure her blood pressure will still be very high when she sleeps really soundly.  She had not taken her blood pressure that day because 2 days ago her blood pressure dropped for no reason to 60s /40s.  She was sitting when this happened.  It took a couple of hours to resolve.  She states she has had this 1 other time 3-4 months ago.  Typically her blood pressure is fairly well controlled.  She states it is just always high when she sleeps very soundly.  Most of the time she does not sleep well because she is constantly getting up and going to the bathroom.  She also states that her depression and anxiety are not controlled.  She is taking the amitriptyline at night, which does help with  the depression.  She also has found that the amitriptyline helps with bladder spasms.  She states her anxiety is not well controlled and wondered if she could take something as needed.  She has been experiencing nausea and is unsure why.  Her gastroenterologist has prescribed her Zofran and she does take that as needed.  She did not feel that that was working well enough and wondered if she could have some promethazine.  She started to feel extremely nauseous member on the phone and started throwing up.  I did send the promethazine to the pharmacy and we did in the call.     Social History   Socioeconomic History  . Marital status: Married    Spouse name: Not on file  . Number of children: 2  . Years of education: 68  . Highest education level: Not on file  Occupational History  . Occupation: disabled    Fish farm manager: UNEMPLOYED  Social Needs  . Financial resource strain: Not on file  . Food insecurity:    Worry: Not on file    Inability: Not on file  . Transportation needs:    Medical: Not on file    Non-medical: Not on file  Tobacco Use  . Smoking status: Former Smoker    Packs/day: 2.50    Years: 39.00    Pack years: 97.50  Types: Cigarettes    Last attempt to quit: 10/22/2010    Years since quitting: 8.2  . Smokeless tobacco: Never Used  Substance and Sexual Activity  . Alcohol use: No    Alcohol/week: 0.0 standard drinks  . Drug use: No  . Sexual activity: Not Currently  Lifestyle  . Physical activity:    Days per week: Not on file    Minutes per session: Not on file  . Stress: Not on file  Relationships  . Social connections:    Talks on phone: Not on file    Gets together: Not on file    Attends religious service: Not on file    Active member of club or organization: Not on file    Attends meetings of clubs or organizations: Not on file    Relationship status: Not on file  Other Topics Concern  . Not on file  Social History Narrative   Lives at home w/ her  husband and grandson   Right-handed   Caffeine: 1 cup of coffee per week + [employer]      Assessment and Plan:  See Problem List for Assessment and Plan of chronic medical problems.   Follow Up Instructions:    I discussed the assessment and treatment plan with the patient. The patient was provided an opportunity to ask questions and all were answered. The patient agreed with the plan and demonstrated an understanding of the instructions.   The patient was advised to call back or seek an in-person evaluation if the symptoms worsen or if the condition fails to improve as anticipated.    Binnie Rail, MD

## 2019-01-04 NOTE — Assessment & Plan Note (Signed)
Blood pressure has been variable She is taking amlodipine and Lopressor. Overall blood pressure seems to be controlled She did have a very low blood pressure 2 days ago 60s/40s for no reason-this only occurred one other time 3-4 months ago.  I cannot explain this, but I do not want to change any medication at this time for that Blood pressure runs very high when she sat sleeps soundly-possibly related to untreated sleep apnea.  She did not tolerate her CPAP machine-advised follow-up with pulmonary to see if there are any other options Advised her to monitor her blood pressure closely and keep a log so that we can adjust her medication if needed

## 2019-01-05 ENCOUNTER — Encounter: Payer: Self-pay | Admitting: Internal Medicine

## 2019-01-10 DIAGNOSIS — J449 Chronic obstructive pulmonary disease, unspecified: Secondary | ICD-10-CM | POA: Diagnosis not present

## 2019-01-12 DIAGNOSIS — Z5181 Encounter for therapeutic drug level monitoring: Secondary | ICD-10-CM | POA: Diagnosis not present

## 2019-01-12 DIAGNOSIS — G894 Chronic pain syndrome: Secondary | ICD-10-CM | POA: Diagnosis not present

## 2019-01-12 DIAGNOSIS — Z0289 Encounter for other administrative examinations: Secondary | ICD-10-CM | POA: Diagnosis not present

## 2019-01-12 DIAGNOSIS — M533 Sacrococcygeal disorders, not elsewhere classified: Secondary | ICD-10-CM | POA: Diagnosis not present

## 2019-01-12 DIAGNOSIS — M5137 Other intervertebral disc degeneration, lumbosacral region: Secondary | ICD-10-CM | POA: Diagnosis not present

## 2019-02-02 ENCOUNTER — Ambulatory Visit: Payer: Self-pay | Admitting: *Deleted

## 2019-02-02 NOTE — Progress Notes (Signed)
Virtual Visit via telephone note  I connected with Mary Acosta on 02/03/19 at 10:00 AM EDT by telephone and verified that I am speaking with the correct person using two identifiers.  She is unable to do a video visit.   I discussed the limitations of evaluation and management by telemedicine and the availability of in person appointments. The patient expressed understanding and agreed to proceed.  The patient is currently at home and I am in the office.    No referring provider.    History of Present Illness: This is an acute visit for anxiety and depression related to sudden death of her grandson who lived with her and her husband. The cause of death is not known.  The medical examiner has not completed his report yet.  The death was sudden and it occurred at home.  Both her and her husband were there.  She is distraught.  She is anxious, depressed and obviously experiencing extreme grief.  She has talked to her pastor, but it has not helped because it does not bring him back.  She did call behavioral health and was told she needed a referral for seeing a psychiatrist.    She has not been sleeping good.  She is not eating much.  She denies any suicidal ideation, but just states she cannot live this way and has no idea how she is going to get through this.  Every time she looks around the house she sees his pictures on his clothes.  She just needs something that will help her get through this.  We discussed that with her medications it is difficult to change her medications or add much to it.  She states in the past she was taking Pristiq with her current dose of amitriptyline and did not have any side effects and felt that that worked well.  She is taking her BuSpar.  She is willing to see a psychiatrist-again she just keeps stating that she just want something to help her get through this because she does not think she will be able to.    Social History   Socioeconomic History   . Marital status: Married    Spouse name: Not on file  . Number of children: 2  . Years of education: 11  . Highest education level: Not on file  Occupational History  . Occupation: disabled    Fish farm manager: UNEMPLOYED  Social Needs  . Financial resource strain: Not on file  . Food insecurity:    Worry: Not on file    Inability: Not on file  . Transportation needs:    Medical: Not on file    Non-medical: Not on file  Tobacco Use  . Smoking status: Former Smoker    Packs/day: 2.50    Years: 39.00    Pack years: 97.50    Types: Cigarettes    Last attempt to quit: 10/22/2010    Years since quitting: 8.2  . Smokeless tobacco: Never Used  Substance and Sexual Activity  . Alcohol use: No    Alcohol/week: 0.0 standard drinks  . Drug use: No  . Sexual activity: Not Currently  Lifestyle  . Physical activity:    Days per week: Not on file    Minutes per session: Not on file  . Stress: Not on file  Relationships  . Social connections:    Talks on phone: Not on file    Gets together: Not on file    Attends religious service: Not  on file    Active member of club or organization: Not on file    Attends meetings of clubs or organizations: Not on file    Relationship status: Not on file  Other Topics Concern  . Not on file  Social History Narrative   Lives at home w/ her husband and grandson   Right-handed   Caffeine: 1 cup of coffee per week + [employer]eps[employer]       Assessment and Plan:  See Problem List for Assessment and Plan of chronic medical problems.   Follow Up Instructions:    I discussed the assessment and treatment plan with the patient. The patient was provided an opportunity to ask questions and all were answered. The patient agreed with the plan and demonstrated an understanding of the instructions.   The patient was advised to call back or seek an in-person evaluation if the symptoms worsen or if the condition fails to improve as anticipated.  Time spent on the  telephone with patient 15 minutes.    Binnie Rail, MD

## 2019-02-02 NOTE — Telephone Encounter (Signed)
Pt crying during call, states her grandson "Was found dead" in her home January 25, 2019. States has not been able to function since then. States called Behavioral Health today who gave her name of psychiatrist but cannot get appt until August. States "I just need something to get me through this." States husband supportive. No su ideal or homicidal ideation. Call transferred to practice for consideration of appt. Advised to go to ED if depression, anxiety worsen. Verbalizes understanding.   Reason for Disposition . [1] Depression AND [2] worsening (e.g.,sleeping poorly, less able to do activities of daily living)  Answer Assessment - Initial Assessment Questions 1. CONCERN: "What happened that made you call today?"     Grandson "Found dead at her home" Jan 25, 2019 2. DEPRESSION SYMPTOM SCREENING: "How are you feeling overall?" (e.g., decreased energy, increased sleeping or difficulty sleeping, difficulty concentrating, feelings of sadness, guilt, hopelessness, or worthlessness)     All above 3. RISK OF HARM - SUICIDAL IDEATION:  "Do you ever have thoughts of hurting or killing yourself?"  (e.g., yes, no, no but preoccupation with thoughts about death)   - INTENT:  "Do you have thoughts of hurting or killing yourself right NOW?" (e.g., yes, no, N/A)   - PLAN: "Do you have a specific plan for how you would do this?" (e.g., gun, knife, overdose, no plan, N/A)     no 4. RISK OF HARM - HOMICIDAL IDEATION:  "Do you ever have thoughts of hurting or killing someone else?"  (e.g., yes, no, no but preoccupation with thoughts about death)   - INTENT:  "Do you have thoughts of hurting or killing someone right NOW?" (e.g., yes, no, N/A)   - PLAN: "Do you have a specific plan for how you would do this?" (e.g., gun, knife, no plan, N/A)      no 5. FUNCTIONAL IMPAIRMENT: "How have things been going for you overall in your life? Have you had any more difficulties than usual doing your normal daily activities?"  (e.g.,  better, same, worse; self-care, school, work, interactions)     Can not go go about usual ADLs 6. SUPPORT: "Who is with you now?" "Who do you live with?" "Do you have family or friends nearby who you can talk to?"      husband 7. THERAPIST: "Do you have a counselor or therapist? Name?"     no 8. STRESSORS: "Has there been any new stress or recent changes in your life?"     Death of grandson 38. DRUG ABUSE/ALCOHOL: "Do you drink alcohol or use any illegal drugs?"      no 10. OTHER: "Do you have any other health or medical symptoms right now?" (e.g., fever)       no  Protocols used: DEPRESSION-A-AH

## 2019-02-02 NOTE — Telephone Encounter (Signed)
FYI

## 2019-02-03 ENCOUNTER — Other Ambulatory Visit: Payer: Self-pay

## 2019-02-03 ENCOUNTER — Ambulatory Visit (INDEPENDENT_AMBULATORY_CARE_PROVIDER_SITE_OTHER): Payer: Medicare HMO | Admitting: Internal Medicine

## 2019-02-03 ENCOUNTER — Encounter: Payer: Self-pay | Admitting: Internal Medicine

## 2019-02-03 DIAGNOSIS — F411 Generalized anxiety disorder: Secondary | ICD-10-CM

## 2019-02-03 DIAGNOSIS — F4321 Adjustment disorder with depressed mood: Secondary | ICD-10-CM

## 2019-02-03 DIAGNOSIS — F329 Major depressive disorder, single episode, unspecified: Secondary | ICD-10-CM | POA: Diagnosis not present

## 2019-02-03 DIAGNOSIS — R69 Illness, unspecified: Secondary | ICD-10-CM | POA: Diagnosis not present

## 2019-02-03 DIAGNOSIS — F32A Depression, unspecified: Secondary | ICD-10-CM

## 2019-02-03 MED ORDER — DESVENLAFAXINE SUCCINATE ER 50 MG PO TB24: 50.0000 mg | ORAL_TABLET | Freq: Every day | ORAL | 5 refills | Status: DC

## 2019-02-03 NOTE — Assessment & Plan Note (Signed)
Has anxiety and is taking BuSpar, but with her grandsons recent death her anxiety and grief are extreme Continue BuSpar at her current dose Will refer to psychiatry-I think she needs help with medication adjustment and close monitoring Will start Pristiq 50 mg daily She has taken this in the past while on the amitriptyline and did okay with that-no side effects so we will continue the amitriptyline for now Discussed side effects to monitor for and if she does have any she is okay with decreasing the amitriptyline or coming off of it

## 2019-02-03 NOTE — Assessment & Plan Note (Signed)
Increased depression related to her grandson sudden death Will refer to psychiatry-I think she needs help with medication adjustment and close monitoring Will start Pristiq 50 mg daily She has taken this in the past while on the amitriptyline and did okay with that-no side effects so we will continue the amitriptyline for now Discussed side effects to monitor for and if she does have any she is okay with decreasing the amitriptyline or coming off of it

## 2019-02-03 NOTE — Assessment & Plan Note (Signed)
Grandson suddenly died in May 2020.  He was living with them and died at home Since that time she has been experiencing extreme grief, anxiety and depression This is affecting her ability to eat and her sleep Will refer to psychiatry-I think she needs help with medication adjustment and close monitoring Will start Pristiq 50 mg daily She has taken this in the past while on the amitriptyline and did okay with that-no side effects so we will continue the amitriptyline for now Discussed side effects to monitor for and if she does have any she is okay with decreasing the amitriptyline or coming off of it

## 2019-02-08 DIAGNOSIS — H2513 Age-related nuclear cataract, bilateral: Secondary | ICD-10-CM | POA: Diagnosis not present

## 2019-02-08 DIAGNOSIS — H5203 Hypermetropia, bilateral: Secondary | ICD-10-CM | POA: Diagnosis not present

## 2019-02-08 DIAGNOSIS — H04123 Dry eye syndrome of bilateral lacrimal glands: Secondary | ICD-10-CM | POA: Diagnosis not present

## 2019-02-08 LAB — HM DIABETES EYE EXAM

## 2019-02-10 DIAGNOSIS — J449 Chronic obstructive pulmonary disease, unspecified: Secondary | ICD-10-CM | POA: Diagnosis not present

## 2019-03-08 NOTE — Patient Instructions (Signed)

## 2019-03-08 NOTE — Progress Notes (Signed)
Subjective:    Patient ID: Mary Acosta, female    DOB: 1955/11/20, 63 y.o.   MRN: [id number]  HPI The patient is here for follow up.      Medications and allergies reviewed with patient and updated if appropriate.  Patient Active Problem List   Diagnosis Date Noted  . Grief 02/03/2019  . Nausea 01/04/2019  . Reactive airway disease 10/21/2018  . Arthralgia 09/04/2018  . Narcotic overdose ([place]) 05/14/2018  . Thyroid cancer ([place]) 04/30/2018  . Obesity hypoventilation syndrome ([place]) 09/24/2017  . Palpitations 09/18/2017  . Diabetic neuropathy ([place]) 06/04/2017  . Vitamin B12 deficiency 03/05/2017  . Numbness and tingling in both hands 02/24/2017  . OSA (obstructive sleep apnea) 01/31/2017  . Liver cirrhosis secondary to NASH ([place]) 01/31/2017  . Hypothyroidism 01/31/2017  . Chronic narcotic use 01/31/2017  . Fibromyalgia 01/31/2017  . Hyperlipidemia 01/31/2017  . Gastroparesis   . Memory disorder 07/25/2016  . Lump in neck 01/29/2016  . Chronic diastolic (congestive) heart failure ([place]) 01/03/2016  . Fatty liver 01/03/2016  . Type 1 diabetes mellitus ([name]) 01/03/2016  . Recurrent Clostridium difficile diarrhea 01/03/2016  . Chronic respiratory failure ([place]) 11/10/2014  . Physical deconditioning 09/26/2014  . Dyspnea 09/26/2014  . Iron deficiency anemia, unspecified  04/05/2011  . Bariatric surgery status 04/05/2011  . Left arm pain   . UNSPECIFIED VITAMIN D DEFICIENCY 10/22/2007  . LOW BACK PAIN, CHRONIC 10/22/2007  . INSOMNIA 10/22/2007  . Obesity 10/14/2007  . Chronic pain syndrome 10/14/2007  . CARPAL TUNNEL SYNDROME 10/14/2007  . PERIPHERAL NEUROPATHY 10/14/2007  . ALLERGIC RHINITIS CAUSE UNSPECIFIED 10/14/2007  . ARTHRITIS 10/14/2007  . Anxiety state 08/25/2007  . Depression 08/25/2007  . Essential hypertension 08/25/2007  . ASTHMA 08/25/2007  . CONSTIPATION 08/25/2007  . POLYMYALGIA RHEUMATICA 08/25/2007  . LEG EDEMA, BILATERAL 08/25/2007     Current Outpatient Medications on File Prior to Visit  Medication Sig Dispense Refill  . albuterol (PROVENTIL HFA;VENTOLIN HFA) 108 (90 Base) MCG/ACT inhaler Inhale 2 puffs into the lungs every 6 (six) hours as needed for wheezing or shortness of breath. 1 Inhaler 11  . amitriptyline (ELAVIL) 100 MG tablet TAKE 1 TABLET BY MOUTH EVERY DAY AT NIGHT  3  . amLODipine (NORVASC) 5 MG tablet TAKE 1 TABLET BY MOUTH TWICE A DAY 180 tablet 1  . aspirin EC 81 MG tablet Take 1 tablet (81 mg total) by mouth daily. 90 tablet 3  . atorvastatin (LIPITOR) 40 MG tablet TAKE 1 TABLET (40 MG TOTAL) BY MOUTH DAILY. DUE FOR OV PLEASE CALL FOR APPT 30 tablet 5  . busPIRone (BUSPAR) 7.5 MG tablet TAKE 1 TABLET (7.5 MG TOTAL) BY MOUTH 3 (THREE) TIMES DAILY AS NEEDED.  1  . desvenlafaxine (PRISTIQ) 50 MG 24 hr tablet Take 1 tablet (50 mg total) by mouth daily. 30 tablet 5  . furosemide (LASIX) 20 MG tablet TAKE 1 TABLET BY MOUTH EVERY DAY 90 tablet 2  . insulin detemir (LEVEMIR) 100 UNIT/ML injection Inject 2.4 mLs (240 Units total) into the skin every morning. And 100-unit syringes 3/day. 80 mL 11  . insulin NPH Human (NOVOLIN N RELION) 100 UNIT/ML injection Inject 1.9 mLs (190 Units total) into the skin every morning. And syringes 1/day 60 mL 11  . Insulin Syringe-Needle U-100 (INSULIN SYRINGE 1CC/30GX5/16") 30G X 5/16" 1 ML MISC Use as directed for insulin injections 5 times daily 150 each 1  . ipratropium-albuterol (DUONEB) 0.5-2.5 (3) MG/3ML SOLN Take 3 mLs by  nebulization every 6 (six) hours as needed (Wheezing or dyspnea.).    Marland Kitchen levothyroxine (SYNTHROID, LEVOTHROID) 175 MCG tablet TAKE 1 TABLET BY MOUTH SIX DAYS A WEEK BEFORE BREAKFAST 90 tablet 1  . Lidocaine HCl (ASPERCREME LIDOCAINE) 4 % LIQD Apply 1 application topically as needed (pain).     . meloxicam (MOBIC) 7.5 MG tablet Take 15 mg by mouth daily.   5  . metoprolol tartrate (LOPRESSOR) 25 MG tablet Take 1 tablet (25 mg total) by mouth 2 (two) times daily.  180 tablet 3  . morphine (MS CONTIN) 30 MG 12 hr tablet Take 30 mg by mouth 2 (two) times daily.    . ondansetron (ZOFRAN-ODT) 4 MG disintegrating tablet Take 1 tablet (4 mg total) by mouth every 6 (six) hours as needed for nausea or vomiting. 30 tablet 3  . potassium chloride SA (KLOR-CON M20) 20 MEQ tablet TAKE 2 TABLETS EVERY DAY AS NEEDED FOR CRAMPING 180 tablet 1  . promethazine (PHENERGAN) 25 MG tablet Take 1 tablet (25 mg total) by mouth every 8 (eight) hours as needed for up to 4 days for nausea or vomiting. 10 tablet 0  . tiZANidine (ZANAFLEX) 2 MG tablet Take 2 mg by mouth as needed for muscle spasms.   2   No current facility-administered medications on file prior to visit.     Past Medical History:  Diagnosis Date  . AKI (acute kidney injury) ([place]) 01/2017  . Anxiety    with panic attacks  . Arthritis    "back; feet; hands; shoulders" (08/26/2014)  . Asthma   . Cervical cancer ([place])   . Chronic lower back pain   . Chronic narcotic use   . Chronic pain syndrome    [facility]  . Cirrhosis ([place])   . Clostridium difficile infection 2017  . COPD (chronic obstructive pulmonary disease) ([place])   . Daily headache   . Depression   . Diabetic neuropathy ([place]) 06/04/2017  . DJD (degenerative joint disease)   . Fatty liver disease, nonalcoholic   . Fibromyalgia   . Frequency of urination   . HCAP (healthcare-associated pneumonia) 08/26/2014  . History of TIA (transient ischemic attack) 11-01-2010   NO RESIDUAL  . Hyperlipidemia   . Hypertension   . Hypothyroidism   . IDDM (insulin dependent diabetes mellitus) ([place])   . Insomnia   . Lumbar stenosis   . Memory difficulty 07/25/2016  . Nocturia   . OSA (obstructive sleep apnea)    NO CPAP SINCE WT LOSS  . Osteoarthritis    with severe disease in knee  . Pneumonia "several times"  . Polymyalgia rheumatica ([place])   . Scoliosis   . Seasonal allergies   . Thyroid cancer ([place])   . Urgency of urination   .  Vaginal pain S/P SLING  FEB 2012    Past Surgical History:  Procedure Laterality Date  . APPENDECTOMY  1982  . BREAST EXCISIONAL BIOPSY Left 02/28/2005   Atypical Ductal Hyperplasia  . CARDIAC CATHETERIZATION  09-04-2004   NORMAL CORONARY ANATOMY/ NORMAL LVF/ EF 60%  . CARDIOVASCULAR STRESS TEST  12-27-2010  DR [place]   ABNORMAL NUCLEAR STUDY W/ /MILD INFERIOR ISCHEMIA/ EF 69%/  CT HEART ANGIOGRAM ;  NO ACUTE FINDINGS  . CRYOABLATION  05/16/2003   w/LEEP FOR ABNORMAL PAP SMEAR  . CYSTOSCOPY  05/18/2012   Procedure: CYSTOSCOPY;  Surgeon: Reece Packer, MD;  [facility];  Service: Urology;  Laterality: N/A;  examination  under anethesia  . ESOPHAGOGASTRODUODENOSCOPY (EGD) WITH PROPOFOL N/A 09/03/2016   Procedure: ESOPHAGOGASTRODUODENOSCOPY (EGD) WITH PROPOFOL;  Surgeon: Doran Stabler, MD;  [facility];  Service: Gastroenterology;  Laterality: N/A;  . HYSTEROSCOPY W/D&C  08-19-2007   PMB  . KNEE ARTHROSCOPY W/ DEBRIDEMENT Left 03/29/2006   INTERNAL DERANGEMENT/ SEVERE DJD/ MENISCUS TEARS  . LAPAROSCOPIC CHOLECYSTECTOMY  06-10-2002  . LAPAROSCOPIC GASTRIC BANDING  03/01/2006   TRUNCAL VAGOTOMY/ PLACEMENT OF VG BAND  . REVISION TOTAL KNEE ARTHROPLASTY Left 08-29-2008; 05/2009  . TONSILLECTOMY  1969  . TOTAL KNEE ARTHROPLASTY Left 01-23-2007   SEVERE DJD  . TOTAL THYROIDECTOMY  11-22-2005   BILATERAL THYROID NODULES-- PAPILLARY CARCINOMA (0.5CM)/ ADENOMATOID NODULES  . TRANSTHORACIC ECHOCARDIOGRAM  12-27-2010   LVSF NORMAL / EF 16-38%/ GRADE I DIASTOLIC DYSFUNCTION/ MILD MITRAL REGURG. / MILDLY DILATED LEFT ATRIUM/ MILDY INCREASED SYSTOLIC PRESSURE OF PULMONARY ARTERIES  . TRANSVAGINAL SUBURETERAL TAPE/ SLING  09-28-2010   MIXED URINARY INCONTINENCE  . TUBAL LIGATION  1983    Social History   Socioeconomic History  . Marital status: Married    Spouse name: Not on file  . Number of children: 2  . Years of education: 13  . Highest education  level: Not on file  Occupational History  . Occupation: disabled    Fish farm manager: UNEMPLOYED  Social Needs  . Financial resource strain: Not on file  . Food insecurity    Worry: Not on file    Inability: Not on file  . Transportation needs    Medical: Not on file    Non-medical: Not on file  Tobacco Use  . Smoking status: Former Smoker    Packs/day: 2.50    Years: 39.00    Pack years: 97.50    Types: Cigarettes    Quit date: 10/22/2010    Years since quitting: 8.3  . Smokeless tobacco: Never Used  Substance and Sexual Activity  . Alcohol use: No    Alcohol/week: 0.0 standard drinks  . Drug use: No  . Sexual activity: Not Currently  Lifestyle  . Physical activity    Days per week: Not on file    Minutes per session: Not on file  . Stress: Not on file  Relationships  . Social Herbalist on phone: Not on file    Gets together: Not on file    Attends religious service: Not on file    Active member of club or organization: Not on file    Attends meetings of clubs or organizations: Not on file    Relationship status: Not on file  Other Topics Concern  . Not on file  Social History Narrative   Lives at home w/ her husband and grandson   Right-handed   Caffeine: 1 cup of coffee per week + [employer]epsi    Family History  Problem Relation Age of Onset  . Diabetes Mother   . Heart disease Mother   . Dementia Mother   . Heart disease Father   . High blood pressure Father   . Colon cancer Maternal Uncle        x 2  . Breast cancer Other        great aunts x 5    Review of Systems     Objective:  There were no vitals filed for this visit. BP Readings from Last 3 Encounters:  10/21/18 (!) 160/72  10/08/18 134/82  09/04/18 126/78   Wt Readings from Last 3 Encounters:  10/21/18 294  lb (133.4 kg)  10/08/18 294 lb 12.8 oz (133.7 kg)  09/04/18 296 lb 12.8 oz (134.6 kg)   There is no height or weight on file to calculate BMI.   Physical Exam    Constitutional:  Appears well-developed and well-nourished. No distress.  HENT:  Head: Normocephalic and atraumatic.  Neck: Neck supple. No tracheal deviation present. No thyromegaly present.  No cervical lymphadenopathy Cardiovascular: Normal rate, regular rhythm and normal heart sounds.   No murmur heard. No carotid bruit .  No edema Pulmonary/Chest: Effort normal and breath sounds normal. No respiratory distress. No has no wheezes. No rales.  Skin: Skin is warm and dry. Not diaphoretic.  Psychiatric: Normal mood and affect. Behavior is normal.      Assessment & Plan:    See Problem List for Assessment and Plan of chronic medical problems.   This encounter was created in error - please disregard.

## 2019-03-09 ENCOUNTER — Encounter: Payer: Medicare HMO | Admitting: Internal Medicine

## 2019-03-12 ENCOUNTER — Other Ambulatory Visit: Payer: Self-pay | Admitting: Internal Medicine

## 2019-03-12 ENCOUNTER — Encounter: Payer: Self-pay | Admitting: Internal Medicine

## 2019-03-12 DIAGNOSIS — J449 Chronic obstructive pulmonary disease, unspecified: Secondary | ICD-10-CM | POA: Diagnosis not present

## 2019-03-14 ENCOUNTER — Encounter: Payer: Self-pay | Admitting: Internal Medicine

## 2019-03-15 NOTE — Progress Notes (Signed)
Subjective:    Patient ID: Mary Acosta, female    DOB: 1956/08/11, 63 y.o.   MRN: [id number]  HPI The patient is here for follow up.  Grief, Depression, anxiety:  She is taking her medication as prescribed. She feels depressed, anxious and is not sure how she is going to survive this.  She has a lot of support from church, her grandson's counselor and friends.    Diabetes: She is taking her medication daily as prescribed. She is somewhat compliant with a diabetic diet.     Hypertension: She is taking her medication daily. She is compliant with a low sodium diet.   She does monitor her blood pressure at home 160-170's -- it did get better after increasing the hydralazine.  The lowest is has been is 150's/80.    Hypothyroidism:  She is taking her medication daily.  She denies any recent changes in energy or weight that are unexplained/not related to depression/grief.     Medications and allergies reviewed with patient and updated if appropriate.  Patient Active Problem List   Diagnosis Date Noted  . Grief 02/03/2019  . Nausea 01/04/2019  . Reactive airway disease 10/21/2018  . Arthralgia 09/04/2018  . Narcotic overdose ([place]) 05/14/2018  . Thyroid cancer ([place]) 04/30/2018  . Obesity hypoventilation syndrome ([place]) 09/24/2017  . Palpitations 09/18/2017  . Diabetic neuropathy ([place]) 06/04/2017  . Vitamin B12 deficiency 03/05/2017  . Numbness and tingling in both hands 02/24/2017  . OSA (obstructive sleep apnea) 01/31/2017  . Liver cirrhosis secondary to NASH ([place]) 01/31/2017  . Hypothyroidism 01/31/2017  . Chronic narcotic use 01/31/2017  . Fibromyalgia 01/31/2017  . Hyperlipidemia 01/31/2017  . Gastroparesis   . Memory disorder 07/25/2016  . Lump in neck 01/29/2016  . Chronic diastolic (congestive) heart failure ([place]) 01/03/2016  . Fatty liver 01/03/2016  . Type 1 diabetes mellitus ([place]) 01/03/2016  . Recurrent Clostridium difficile diarrhea 01/03/2016  . Chronic  respiratory failure ([place]) 11/10/2014  . Physical deconditioning 09/26/2014  . Dyspnea 09/26/2014  . Iron deficiency anemia, unspecified  04/05/2011  . Bariatric surgery status 04/05/2011  . Left arm pain   . UNSPECIFIED VITAMIN D DEFICIENCY 10/22/2007  . LOW BACK PAIN, CHRONIC 10/22/2007  . INSOMNIA 10/22/2007  . Obesity 10/14/2007  . Chronic pain syndrome 10/14/2007  . CARPAL TUNNEL SYNDROME 10/14/2007  . ALLERGIC RHINITIS CAUSE UNSPECIFIED 10/14/2007  . ARTHRITIS 10/14/2007  . Anxiety state 08/25/2007  . Depression 08/25/2007  . Essential hypertension 08/25/2007  . ASTHMA 08/25/2007  . CONSTIPATION 08/25/2007  . POLYMYALGIA RHEUMATICA 08/25/2007  . LEG EDEMA, BILATERAL 08/25/2007    Current Outpatient Medications on File Prior to Visit  Medication Sig Dispense Refill  . albuterol (PROVENTIL HFA;VENTOLIN HFA) 108 (90 Base) MCG/ACT inhaler Inhale 2 puffs into the lungs every 6 (six) hours as needed for wheezing or shortness of breath. 1 Inhaler 11  . amitriptyline (ELAVIL) 100 MG tablet TAKE 1 TABLET BY MOUTH EVERY DAY AT NIGHT  3  . amLODipine (NORVASC) 5 MG tablet TAKE 1 TABLET BY MOUTH TWICE A DAY 180 tablet 1  . aspirin EC 81 MG tablet Take 1 tablet (81 mg total) by mouth daily. 90 tablet 3  . atorvastatin (LIPITOR) 40 MG tablet TAKE 1 TABLET (40 MG TOTAL) BY MOUTH DAILY. DUE FOR OV PLEASE CALL FOR APPT 30 tablet 5  . busPIRone (BUSPAR) 7.5 MG tablet TAKE 1 TABLET (7.5 MG TOTAL) BY MOUTH 3 (THREE) TIMES DAILY AS NEEDED.  1  .  desvenlafaxine (PRISTIQ) 50 MG 24 hr tablet Take 1 tablet (50 mg total) by mouth daily. 30 tablet 5  . furosemide (LASIX) 20 MG tablet TAKE 1 TABLET BY MOUTH EVERY DAY 90 tablet 2  . hydrALAZINE (APRESOLINE) 25 MG tablet TAKE 1 TABLET BY MOUTH THREE TIMES A DAY 270 tablet 1  . insulin detemir (LEVEMIR) 100 UNIT/ML injection Inject 2.4 mLs (240 Units total) into the skin every morning. And 100-unit syringes 3/day. 80 mL 11  . insulin NPH Human (NOVOLIN N  RELION) 100 UNIT/ML injection Inject 1.9 mLs (190 Units total) into the skin every morning. And syringes 1/day 60 mL 11  . Insulin Syringe-Needle U-100 (INSULIN SYRINGE 1CC/30GX5/16") 30G X 5/16" 1 ML MISC Use as directed for insulin injections 5 times daily 150 each 1  . ipratropium-albuterol (DUONEB) 0.5-2.5 (3) MG/3ML SOLN Take 3 mLs by nebulization every 6 (six) hours as needed (Wheezing or dyspnea.).    Marland Kitchen levothyroxine (SYNTHROID, LEVOTHROID) 175 MCG tablet TAKE 1 TABLET BY MOUTH SIX DAYS A WEEK BEFORE BREAKFAST 90 tablet 1  . Lidocaine HCl (ASPERCREME LIDOCAINE) 4 % LIQD Apply 1 application topically as needed (pain).     . meloxicam (MOBIC) 7.5 MG tablet Take 15 mg by mouth daily.   5  . metoprolol tartrate (LOPRESSOR) 25 MG tablet Take 1 tablet (25 mg total) by mouth 2 (two) times daily. 180 tablet 3  . morphine (MS CONTIN) 30 MG 12 hr tablet Take 30 mg by mouth 2 (two) times daily.    . ondansetron (ZOFRAN-ODT) 4 MG disintegrating tablet Take 1 tablet (4 mg total) by mouth every 6 (six) hours as needed for nausea or vomiting. 30 tablet 3  . potassium chloride SA (KLOR-CON M20) 20 MEQ tablet TAKE 2 TABLETS EVERY DAY AS NEEDED FOR CRAMPING 180 tablet 1  . tiZANidine (ZANAFLEX) 2 MG tablet Take 2 mg by mouth as needed for muscle spasms.   2   No current facility-administered medications on file prior to visit.     Past Medical History:  Diagnosis Date  . AKI (acute kidney injury) ([place]) 01/2017  . Anxiety    with panic attacks  . Arthritis    "back; feet; hands; shoulders" (08/26/2014)  . Asthma   . Cervical cancer ([place])   . Chronic lower back pain   . Chronic narcotic use   . Chronic pain syndrome    [facility]  . Cirrhosis ([place])   . Clostridium difficile infection 2017  . COPD (chronic obstructive pulmonary disease) ([place])   . Daily headache   . Depression   . Diabetic neuropathy ([place]) 06/04/2017  . DJD (degenerative joint disease)   . Fatty liver disease,  nonalcoholic   . Fibromyalgia   . Frequency of urination   . HCAP (healthcare-associated pneumonia) 08/26/2014  . History of TIA (transient ischemic attack) 11-01-2010   NO RESIDUAL  . Hyperlipidemia   . Hypertension   . Hypothyroidism   . IDDM (insulin dependent diabetes mellitus) ([place])   . Insomnia   . Lumbar stenosis   . Memory difficulty 07/25/2016  . Nocturia   . OSA (obstructive sleep apnea)    NO CPAP SINCE WT LOSS  . Osteoarthritis    with severe disease in knee  . Pneumonia "several times"  . Polymyalgia rheumatica ([place])   . Scoliosis   . Seasonal allergies   . Thyroid cancer ([place])   . Urgency of urination   . Vaginal pain S/P SLING  FEB  2012    Past Surgical History:  Procedure Laterality Date  . APPENDECTOMY  1982  . BREAST EXCISIONAL BIOPSY Left 02/28/2005   Atypical Ductal Hyperplasia  . CARDIAC CATHETERIZATION  09-04-2004   NORMAL CORONARY ANATOMY/ NORMAL LVF/ EF 60%  . CARDIOVASCULAR STRESS TEST  12-27-2010  DR [place]   ABNORMAL NUCLEAR STUDY W/ /MILD INFERIOR ISCHEMIA/ EF 69%/  CT HEART ANGIOGRAM ;  NO ACUTE FINDINGS  . CRYOABLATION  05/16/2003   w/LEEP FOR ABNORMAL PAP SMEAR  . CYSTOSCOPY  05/18/2012   Procedure: CYSTOSCOPY;  Surgeon: Reece Packer, MD;  Location: [facility];  Service: Urology;  Laterality: N/A;  examination under anethesia  . ESOPHAGOGASTRODUODENOSCOPY (EGD) WITH PROPOFOL N/A 09/03/2016   Procedure: ESOPHAGOGASTRODUODENOSCOPY (EGD) WITH PROPOFOL;  Surgeon: Doran Stabler, MD;  [facility];  Service: Gastroenterology;  Laterality: N/A;  . HYSTEROSCOPY W/D&C  08-19-2007   PMB  . KNEE ARTHROSCOPY W/ DEBRIDEMENT Left 03/29/2006   INTERNAL DERANGEMENT/ SEVERE DJD/ MENISCUS TEARS  . LAPAROSCOPIC CHOLECYSTECTOMY  06-10-2002  . LAPAROSCOPIC GASTRIC BANDING  03/01/2006   TRUNCAL VAGOTOMY/ PLACEMENT OF VG BAND  . REVISION TOTAL KNEE ARTHROPLASTY Left 08-29-2008; 05/2009  . TONSILLECTOMY  1969  . TOTAL KNEE  ARTHROPLASTY Left 01-23-2007   SEVERE DJD  . TOTAL THYROIDECTOMY  11-22-2005   BILATERAL THYROID NODULES-- PAPILLARY CARCINOMA (0.5CM)/ ADENOMATOID NODULES  . TRANSTHORACIC ECHOCARDIOGRAM  12-27-2010   LVSF NORMAL / EF 16-10%/ GRADE I DIASTOLIC DYSFUNCTION/ MILD MITRAL REGURG. / MILDLY DILATED LEFT ATRIUM/ MILDY INCREASED SYSTOLIC PRESSURE OF PULMONARY ARTERIES  . TRANSVAGINAL SUBURETERAL TAPE/ SLING  09-28-2010   MIXED URINARY INCONTINENCE  . TUBAL LIGATION  1983    Social History   Socioeconomic History  . Marital status: Married    Spouse name: Not on file  . Number of children: 2  . Years of education: 27  . Highest education level: Not on file  Occupational History  . Occupation: disabled    Fish farm manager: UNEMPLOYED  Social Needs  . Financial resource strain: Not on file  . Food insecurity    Worry: Not on file    Inability: Not on file  . Transportation needs    Medical: Not on file    Non-medical: Not on file  Tobacco Use  . Smoking status: Former Smoker    Packs/day: 2.50    Years: 39.00    Pack years: 97.50    Types: Cigarettes    Quit date: 10/22/2010    Years since quitting: 8.4  . Smokeless tobacco: Never Used  Substance and Sexual Activity  . Alcohol use: No    Alcohol/week: 0.0 standard drinks  . Drug use: No  . Sexual activity: Not Currently  Lifestyle  . Physical activity    Days per week: Not on file    Minutes per session: Not on file  . Stress: Not on file  Relationships  . Social Herbalist on phone: Not on file    Gets together: Not on file    Attends religious service: Not on file    Active member of club or organization: Not on file    Attends meetings of clubs or organizations: Not on file    Relationship status: Not on file  Other Topics Concern  . Not on file  Social History Narrative   Lives at home w/ her husband and grandson   Right-handed   Caffeine: 1 cup of coffee per week + Pepsi    Family  History  Problem  Relation Age of Onset  . Diabetes Mother   . Heart disease Mother   . Dementia Mother   . Heart disease Father   . High blood pressure Father   . Colon cancer Maternal Uncle        x 2  . Breast cancer Other        great aunts x 5    Review of Systems  Constitutional: Negative for fever.  Respiratory: Positive for shortness of breath.   Cardiovascular: Positive for chest pain (one episode), palpitations and leg swelling.  Neurological: Positive for headaches.  Psychiatric/Behavioral: Positive for dysphoric mood and sleep disturbance. The patient is nervous/anxious.        Objective:   Vitals:   03/16/19 1430  BP: (!) 178/72  Pulse: 82  Resp: 18  Temp: 98.8 F (37.1 C)  SpO2: 96%   BP Readings from Last 3 Encounters:  03/16/19 (!) 178/72  10/21/18 (!) 160/72  10/08/18 134/82   Wt Readings from Last 3 Encounters:  03/16/19 286 lb (129.7 kg)  10/21/18 294 lb (133.4 kg)  10/08/18 294 lb 12.8 oz (133.7 kg)   Body mass index is 43.49 kg/m.   Physical Exam    Constitutional: Appears well-developed and well-nourished. No distress.  HENT:  Head: Normocephalic and atraumatic.  Neck: Neck supple. No tracheal deviation present. No thyromegaly present.  No cervical lymphadenopathy Cardiovascular: Normal rate, regular rhythm and normal heart sounds.  No murmur heard.  1+ bl LE edema Pulmonary/Chest: Effort normal and breath sounds normal. No respiratory distress. No has no wheezes. No rales.  Skin: Skin is warm and dry. Not diaphoretic.  Psychiatric: crying throughout the visit.  depressed mood and affect. Behavior is normal.      Assessment & Plan:    See Problem List for Assessment and Plan of chronic medical problems.

## 2019-03-16 ENCOUNTER — Other Ambulatory Visit: Payer: Self-pay

## 2019-03-16 ENCOUNTER — Ambulatory Visit (INDEPENDENT_AMBULATORY_CARE_PROVIDER_SITE_OTHER): Payer: Medicare HMO | Admitting: Internal Medicine

## 2019-03-16 ENCOUNTER — Encounter: Payer: Self-pay | Admitting: Internal Medicine

## 2019-03-16 VITALS — BP 178/72 | HR 82 | Temp 98.8°F | Resp 18 | Ht 68.0 in | Wt 286.0 lb

## 2019-03-16 DIAGNOSIS — E039 Hypothyroidism, unspecified: Secondary | ICD-10-CM | POA: Diagnosis not present

## 2019-03-16 DIAGNOSIS — F4321 Adjustment disorder with depressed mood: Secondary | ICD-10-CM | POA: Diagnosis not present

## 2019-03-16 DIAGNOSIS — R69 Illness, unspecified: Secondary | ICD-10-CM | POA: Diagnosis not present

## 2019-03-16 DIAGNOSIS — I10 Essential (primary) hypertension: Secondary | ICD-10-CM

## 2019-03-16 DIAGNOSIS — E1042 Type 1 diabetes mellitus with diabetic polyneuropathy: Secondary | ICD-10-CM

## 2019-03-16 MED ORDER — HYDRALAZINE HCL 100 MG PO TABS: 100.0000 mg | ORAL_TABLET | Freq: Three times a day (TID) | ORAL | 5 refills | Status: DC

## 2019-03-16 MED ORDER — SPIRONOLACTONE 25 MG PO TABS: 25.0000 mg | ORAL_TABLET | Freq: Every day | ORAL | 5 refills | Status: DC

## 2019-03-16 NOTE — Assessment & Plan Note (Signed)
a1c next week Management per Dr Loanne Drilling

## 2019-03-16 NOTE — Assessment & Plan Note (Signed)
Will check tsh management per Dr Loanne Drilling

## 2019-03-16 NOTE — Assessment & Plan Note (Signed)
She has incredible grief associated with anxiety, depression She is taking the pristiq and it is helping She is taking buspar Deferred psych Consider seeing a therapist

## 2019-03-16 NOTE — Assessment & Plan Note (Signed)
Not controlled Continue current medication Start aldactone 25 mg daily - may help with edema - can not take lasix daily cmp next week Monitor BP at home

## 2019-03-16 NOTE — Patient Instructions (Addendum)
  Tests ordered today. Your results will be released to [facility] (or called to you) after review.  If any changes need to be made, you will be notified at that same time.    Medications reviewed and updated.  Changes include :   Start spironolactone daily  Your prescription(s) have been submitted to your pharmacy. Please take as directed and contact our office if you believe you are having problem(s) with the medication(s).    Please followup in 3 months

## 2019-03-20 DIAGNOSIS — E119 Type 2 diabetes mellitus without complications: Secondary | ICD-10-CM | POA: Diagnosis not present

## 2019-03-25 ENCOUNTER — Other Ambulatory Visit: Payer: Self-pay | Admitting: Cardiology

## 2019-04-01 ENCOUNTER — Other Ambulatory Visit (INDEPENDENT_AMBULATORY_CARE_PROVIDER_SITE_OTHER): Payer: Medicare HMO

## 2019-04-01 DIAGNOSIS — I10 Essential (primary) hypertension: Secondary | ICD-10-CM | POA: Diagnosis not present

## 2019-04-01 DIAGNOSIS — E1042 Type 1 diabetes mellitus with diabetic polyneuropathy: Secondary | ICD-10-CM | POA: Diagnosis not present

## 2019-04-01 DIAGNOSIS — E039 Hypothyroidism, unspecified: Secondary | ICD-10-CM | POA: Diagnosis not present

## 2019-04-01 LAB — COMPREHENSIVE METABOLIC PANEL
ALT: 21 U/L (ref 0–35)
AST: 29 U/L (ref 0–37)
Albumin: 3.8 g/dL (ref 3.5–5.2)
Alkaline Phosphatase: 94 U/L (ref 39–117)
BUN: 16 mg/dL (ref 6–23)
CO2: 32 mEq/L (ref 19–32)
Calcium: 8.9 mg/dL (ref 8.4–10.5)
Chloride: 102 mEq/L (ref 96–112)
Creatinine, Ser: 0.74 mg/dL (ref 0.40–1.20)
GFR: 79.32 mL/min (ref >60.00)
Glucose, Bld: 216 mg/dL — ABNORMAL HIGH (ref 70–99)
Potassium: 3.5 mEq/L (ref 3.5–5.1)
Sodium: 141 mEq/L (ref 135–145)
Total Bilirubin: 0.5 mg/dL (ref 0.2–1.2)
Total Protein: 6.8 g/dL (ref 6.0–8.3)

## 2019-04-01 LAB — TSH: TSH: 1.32 u[IU]/mL (ref 0.35–4.50)

## 2019-04-01 LAB — HEMOGLOBIN A1C: Hgb A1c MFr Bld: 8.8 % — ABNORMAL HIGH (ref 4.6–6.5)

## 2019-04-02 ENCOUNTER — Encounter: Payer: Self-pay | Admitting: Internal Medicine

## 2019-04-06 DIAGNOSIS — M5137 Other intervertebral disc degeneration, lumbosacral region: Secondary | ICD-10-CM | POA: Diagnosis not present

## 2019-04-06 DIAGNOSIS — M533 Sacrococcygeal disorders, not elsewhere classified: Secondary | ICD-10-CM | POA: Diagnosis not present

## 2019-04-06 DIAGNOSIS — G894 Chronic pain syndrome: Secondary | ICD-10-CM | POA: Diagnosis not present

## 2019-04-07 ENCOUNTER — Other Ambulatory Visit: Payer: Self-pay | Admitting: Internal Medicine

## 2019-04-07 ENCOUNTER — Encounter: Payer: Self-pay | Admitting: Internal Medicine

## 2019-04-08 ENCOUNTER — Telehealth: Payer: Self-pay

## 2019-04-08 NOTE — Telephone Encounter (Signed)
She does not need a referral - will answer her via her mychart message she sent earlier today.

## 2019-04-08 NOTE — Telephone Encounter (Signed)
Copied from [facility] [id number]. Topic: General - Other >> Apr 08, 2019 11:38 AM Yvette Rack wrote: Reason for CRM: Vinnie Level with [facility] stated pt informed her that her 63 year old grandson passed away and she would like a referral to a psychiatrist.

## 2019-04-12 DIAGNOSIS — J449 Chronic obstructive pulmonary disease, unspecified: Secondary | ICD-10-CM | POA: Diagnosis not present

## 2019-04-14 DIAGNOSIS — G894 Chronic pain syndrome: Secondary | ICD-10-CM | POA: Diagnosis not present

## 2019-04-14 DIAGNOSIS — Z0289 Encounter for other administrative examinations: Secondary | ICD-10-CM | POA: Diagnosis not present

## 2019-04-16 ENCOUNTER — Other Ambulatory Visit: Payer: Self-pay | Admitting: Internal Medicine

## 2019-04-26 ENCOUNTER — Other Ambulatory Visit: Payer: Self-pay | Admitting: Internal Medicine

## 2019-05-13 DIAGNOSIS — J449 Chronic obstructive pulmonary disease, unspecified: Secondary | ICD-10-CM | POA: Diagnosis not present

## 2019-05-17 ENCOUNTER — Ambulatory Visit: Payer: Medicare HMO | Admitting: General Practice

## 2019-05-17 ENCOUNTER — Other Ambulatory Visit: Payer: Self-pay

## 2019-05-17 ENCOUNTER — Encounter: Payer: Self-pay | Admitting: Cardiology

## 2019-05-17 VITALS — BP 134/70 | HR 78 | Ht 69.0 in | Wt 283.0 lb

## 2019-05-17 DIAGNOSIS — E7800 Pure hypercholesterolemia, unspecified: Secondary | ICD-10-CM | POA: Diagnosis not present

## 2019-05-17 DIAGNOSIS — I10 Essential (primary) hypertension: Secondary | ICD-10-CM | POA: Diagnosis not present

## 2019-05-17 DIAGNOSIS — I2510 Atherosclerotic heart disease of native coronary artery without angina pectoris: Secondary | ICD-10-CM | POA: Diagnosis not present

## 2019-05-17 DIAGNOSIS — I5032 Chronic diastolic (congestive) heart failure: Secondary | ICD-10-CM

## 2019-05-17 DIAGNOSIS — I272 Pulmonary hypertension, unspecified: Secondary | ICD-10-CM

## 2019-05-17 NOTE — Progress Notes (Signed)
Cardiology Clinic Note   Patient Name: Mary Acosta Date of Encounter: 05/17/2019  Primary Care Provider:  Binnie Rail, MD Primary Cardiologist:  Kirk Ruths, MD  Patient Profile    Mary Acosta. Mary Acosta 63 year old female presents today for follow-up of her essential hypertension and diastolic heart failure.  Past Medical History    Past Medical History:  Diagnosis Date  . AKI (acute kidney injury) ([place]) 01/2017  . Anxiety    with panic attacks  . Arthritis    "back; feet; hands; shoulders" (08/26/2014)  . Asthma   . Cervical cancer ([place])   . Chronic lower back pain   . Chronic narcotic use   . Chronic pain syndrome    [facility]  . Cirrhosis ([place])   . Clostridium difficile infection 2017  . COPD (chronic obstructive pulmonary disease) ([place])   . Daily headache   . Depression   . Diabetic neuropathy ([place]) 06/04/2017  . DJD (degenerative joint disease)   . Fatty liver disease, nonalcoholic   . Fibromyalgia   . Frequency of urination   . HCAP (healthcare-associated pneumonia) 08/26/2014  . History of TIA (transient ischemic attack) 11-01-2010   NO RESIDUAL  . Hyperlipidemia   . Hypertension   . Hypothyroidism   . IDDM (insulin dependent diabetes mellitus) ([place])   . Insomnia   . Lumbar stenosis   . Memory difficulty 07/25/2016  . Nocturia   . OSA (obstructive sleep apnea)    NO CPAP SINCE WT LOSS  . Osteoarthritis    with severe disease in knee  . Pneumonia "several times"  . Polymyalgia rheumatica ([place])   . Scoliosis   . Seasonal allergies   . Thyroid cancer ([place])   . Urgency of urination   . Vaginal pain S/P SLING  FEB 2012   Past Surgical History:  Procedure Laterality Date  . APPENDECTOMY  1982  . BREAST EXCISIONAL BIOPSY Left 02/28/2005   Atypical Ductal Hyperplasia  . CARDIAC CATHETERIZATION  09-04-2004   NORMAL CORONARY ANATOMY/ NORMAL LVF/ EF 60%  . CARDIOVASCULAR STRESS TEST  12-27-2010  DR [place]   ABNORMAL  NUCLEAR STUDY W/ /MILD INFERIOR ISCHEMIA/ EF 69%/  CT HEART ANGIOGRAM ;  NO ACUTE FINDINGS  . CRYOABLATION  05/16/2003   w/LEEP FOR ABNORMAL PAP SMEAR  . CYSTOSCOPY  05/18/2012   Procedure: CYSTOSCOPY;  Surgeon: Reece Packer, MD;  Location: [facility];  Service: Urology;  Laterality: N/A;  examination under anethesia  . ESOPHAGOGASTRODUODENOSCOPY (EGD) WITH PROPOFOL N/A 09/03/2016   Procedure: ESOPHAGOGASTRODUODENOSCOPY (EGD) WITH PROPOFOL;  Surgeon: Doran Stabler, MD;  Location: [facility];  Service: Gastroenterology;  Laterality: N/A;  . HYSTEROSCOPY W/D&C  08-19-2007   PMB  . KNEE ARTHROSCOPY W/ DEBRIDEMENT Left 03/29/2006   INTERNAL DERANGEMENT/ SEVERE DJD/ MENISCUS TEARS  . LAPAROSCOPIC CHOLECYSTECTOMY  06-10-2002  . LAPAROSCOPIC GASTRIC BANDING  03/01/2006   TRUNCAL VAGOTOMY/ PLACEMENT OF VG BAND  . REVISION TOTAL KNEE ARTHROPLASTY Left 08-29-2008; 05/2009  . TONSILLECTOMY  1969  . TOTAL KNEE ARTHROPLASTY Left 01-23-2007   SEVERE DJD  . TOTAL THYROIDECTOMY  11-22-2005   BILATERAL THYROID NODULES-- PAPILLARY CARCINOMA (0.5CM)/ ADENOMATOID NODULES  . TRANSTHORACIC ECHOCARDIOGRAM  12-27-2010   LVSF NORMAL / EF [id number] GRADE I DIASTOLIC DYSFUNCTION/ MILD MITRAL REGURG. / MILDLY DILATED LEFT ATRIUM/ MILDY INCREASED SYSTOLIC PRESSURE OF PULMONARY ARTERIES  . TRANSVAGINAL SUBURETERAL TAPE/ SLING  09-28-2010   MIXED URINARY INCONTINENCE  . [place]  Allergies  Allergies  Allergen Reactions  . Gabapentin Swelling    Swelling in legs Swelling in legs Swelling in legs  . Losartan Other (See Comments)    Myalgias and muscle cramping Other reaction(s): Other (See Comments) Myalgias and muscle cramping Myalgias and muscle cramping Other reaction(s): Other (See Comments) Myalgias and muscle cramping  . Aricept [Donepezil Hcl]     Nausea/vomiting, low BP  . Oxycodone Itching  . Sulfa Antibiotics Nausea Only and Rash  . Sulfonamide Derivatives  Nausea Only    History of Present Illness    [name] was last seen by Dr. Stanford Breed on 03/10/2018.  During that time she was doing well.  Her metoprolol had been increased in April for atrial flutter versus SVT.  A cardiac monitor from May 2019 showed sinus to sinus tachycardia with no significant arrhythmia.  She was having mild dyspnea with exertion however, she does not have any orthopnea PND, lower extremity edema, chest pain or syncope.  She was hypertensive with a systolic blood pressure ranging in the 150s.  On on 05/05/2018 she had an acute episode of fluid volume overload and took 20 mg Lasix for 1 week return to the office for a CMP which was stable.  Her last echocardiogram on 04/10/2018 showed an LVEF of 60 to 65% mild LVH, grade 1 diastolic dysfunction and a mildly dilated right ventricle.  Her PMH also includes essential hypertension, chronic diastolic heart failure, chronic respiratory failure, OSA, obesity hypoventilation syndrome, type 1 diabetes, hypothyroidism, diabetic neuropathy, depression, insomnia, iron deficiency anemia memory disorder, fibromyalgia, hyperlipidemia, palpitations ,and narcotic overdose.  She presents to the clinic today and states she experienced an episode of chest pressure while she was in the shower.  She states the pressure lasted for about 30 seconds and subsided on its own without the use of medication.  She is tearful today stating that her grandson, that they adopted, passed away suddenly a few months ago.  She states there is still waiting on autopsy results.  However, she believes it is a medication that was prescribed by the pediatrician because the death.  She states she is very inactive however she has lost about 3 pounds today.  She states she is tried 4 different masks with her CPAP and has discontinued use because she is not been comfortable with any of the appliances.  She denies chest pain, increased shortness of breath, lower extremity edema,  fatigue, palpitations, melena, hematuria, hemoptysis, diaphoresis, weakness, presyncope, syncope, orthopnea, and PND.     Home Medications    Prior to Admission medications   Medication Sig Start Date End Date Taking? Authorizing Provider  albuterol (VENTOLIN HFA) 108 (90 Base) MCG/ACT inhaler TAKE 2 PUFFS BY MOUTH EVERY 6 HOURS AS NEEDED FOR WHEEZE OR SHORTNESS OF BREATH 04/07/19   Binnie Rail, MD  amLODipine (NORVASC) 5 MG tablet TAKE 1 TABLET BY MOUTH TWICE A DAY 08/06/18   Binnie Rail, MD  aspirin EC 81 MG tablet Take 1 tablet (81 mg total) by mouth daily. 03/10/18   Lelon Perla, MD  atorvastatin (LIPITOR) 40 MG tablet TAKE 1 TABLET (40 MG TOTAL) BY MOUTH DAILY. DUE FOR OV PLEASE CALL FOR APPT 11/13/18   Lelon Perla, MD  busPIRone (BUSPAR) 7.5 MG tablet TAKE 1 TABLET (7.5 MG TOTAL) BY MOUTH 3 (THREE) TIMES DAILY AS NEEDED. 04/20/18   [provider]  desvenlafaxine (PRISTIQ) 50 MG 24 hr tablet Take 1 tablet (50 mg total) by mouth daily.  02/03/19   Binnie Rail, MD  furosemide (LASIX) 20 MG tablet TAKE 1 TABLET BY MOUTH EVERY DAY 09/01/18   Lelon Perla, MD  hydrALAZINE (APRESOLINE) 100 MG tablet Take 1 tablet (100 mg total) by mouth 3 (three) times daily. 03/16/19   Binnie Rail, MD  hydrALAZINE (APRESOLINE) 50 MG tablet TAKE 1 TABLET BY MOUTH TWICE A DAY 03/25/19   Lelon Perla, MD  insulin detemir (LEVEMIR) 100 UNIT/ML injection Inject 2.4 mLs (240 Units total) into the skin every morning. And 100-unit syringes 3/day. 10/08/18   Renato Shin, MD  insulin NPH Human (NOVOLIN N RELION) 100 UNIT/ML injection Inject 1.9 mLs (190 Units total) into the skin every morning. And syringes 1/day 10/23/18   Renato Shin, MD  Insulin Syringe-Needle U-100 (INSULIN SYRINGE 1CC/30GX5/16") 30G X 5/16" 1 ML MISC Use as directed for insulin injections 5 times daily 10/21/18   Renato Shin, MD  ipratropium-albuterol (DUONEB) 0.5-2.5 (3) MG/3ML SOLN Take 3 mLs by nebulization  every 6 (six) hours as needed (Wheezing or dyspnea.). 02/02/17   Hongalgi, Lenis Dickinson, MD  levothyroxine (SYNTHROID) 175 MCG tablet TAKE 1 TABLET BY MOUTH SIX DAYS A WEEK BEFORE BREAKFAST 04/27/19   Burns, Claudina Lick, MD  Lidocaine HCl (ASPERCREME LIDOCAINE) 4 % LIQD Apply 1 application topically as needed (pain).     [provider]  meloxicam (MOBIC) 7.5 MG tablet Take 15 mg by mouth daily.  06/30/15   [provider]  metoprolol tartrate (LOPRESSOR) 25 MG tablet Take 1 tablet (25 mg total) by mouth 2 (two) times daily. 12/14/18   Barrett, Evelene Croon, PA-C  morphine (MS CONTIN) 30 MG 12 hr tablet Take 30 mg by mouth 2 (two) times daily. 09/12/15   [provider]  ondansetron (ZOFRAN-ODT) 4 MG disintegrating tablet Take 1 tablet (4 mg total) by mouth every 6 (six) hours as needed for nausea or vomiting. 04/20/18   [name], MD  potassium chloride SA (KLOR-CON M20) 20 MEQ tablet TAKE 2 TABLETS EVERY DAY AS NEEDED FOR CRAMPING 11/12/17   Binnie Rail, MD  spironolactone (ALDACTONE) 25 MG tablet Take 1 tablet (25 mg total) by mouth daily. 03/16/19   Binnie Rail, MD  tiZANidine (ZANAFLEX) 2 MG tablet Take 2 mg by mouth as needed for muscle spasms.  05/30/15   [provider]    Family History    Family History  Problem Relation Age of Onset  . Diabetes Mother   . Heart disease Mother   . Dementia Mother   . Heart disease Father   . High blood pressure Father   . Colon cancer Maternal Uncle        x 2  . Breast cancer Other        great aunts x 5   She indicated that her mother is deceased. She indicated that her father is deceased. She indicated that only one of her two sisters is alive. She indicated that her brother is deceased. She indicated that the status of her maternal uncle is unknown. She indicated that the status of her other is unknown.  Social History    Social History   Socioeconomic History  . Marital status: Married    Spouse name: Not  on file  . Number of children: 2  . Years of education: 19  . Highest education level: Not on file  Occupational History  . Occupation: disabled    Fish farm manager: UNEMPLOYED  Social Needs  .  Financial resource strain: Not on file  . Food insecurity    Worry: Not on file    Inability: Not on file  . Transportation needs    Medical: Not on file    Non-medical: Not on file  Tobacco Use  . Smoking status: Former Smoker    Packs/day: 2.50    Years: 39.00    Pack years: 97.50    Types: Cigarettes    Quit date: 10/22/2010    Years since quitting: 8.5  . Smokeless tobacco: Never Used  Substance and Sexual Activity  . Alcohol use: No    Alcohol/week: 0.0 standard drinks  . Drug use: No  . Sexual activity: Not Currently  Lifestyle  . Physical activity    Days per week: Not on file    Minutes per session: Not on file  . Stress: Not on file  Relationships  . Social Herbalist on phone: Not on file    Gets together: Not on file    Attends religious service: Not on file    Active member of club or organization: Not on file    Attends meetings of clubs or organizations: Not on file    Relationship status: Not on file  . Intimate partner violence    Fear of current or ex partner: Not on file    Emotionally abused: Not on file    Physically abused: Not on file    Forced sexual activity: Not on file  Other Topics Concern  . Not on file  Social History Narrative   Lives at home w/ her husband and grandson   Right-handed   Caffeine: 1 cup of coffee per week + Pepsi     Review of Systems    General:  No chills, fever, night sweats or weight changes.  Cardiovascular:  No chest pain, dyspnea on exertion, edema, orthopnea, palpitations, paroxysmal nocturnal dyspnea. Dermatological: No rash, lesions/masses Respiratory: No cough, dyspnea Urologic: No hematuria, dysuria Abdominal:   No nausea, vomiting, diarrhea, bright red blood per rectum, melena, or hematemesis Neurologic:   No visual changes, wkns, changes in mental status. All other systems reviewed and are otherwise negative except as noted above.  Physical Exam    VS:  BP 134/70   Pulse 78   Ht 5\' 9"  (1.753 m)   Wt 283 lb (128.4 kg)   BMI 41.79 kg/m  , BMI Body mass index is 41.79 kg/m. GEN: Well nourished, well developed, in no acute distress. HEENT: normal. Neck: Supple, no JVD, carotid bruits, or masses. Cardiac: RRR, no murmurs, rubs, or gallops. No clubbing, cyanosis, edema.  Radials/DP/PT 2+ and equal bilaterally.  Respiratory:  Respirations regular and unlabored, clear to auscultation bilaterally. GI: Soft, nontender, nondistended, BS + x 4. MS: no deformity or atrophy. Skin: warm and dry, no rash. Neuro:  Strength and sensation are intact. Psych: Normal affect.  Accessory Clinical Findings    ECG personally reviewed by me today-sinus rhythm with first-degree AV block 78 bpm- No acute changes  EKG 12/11/2017 Normal sinus rhythm 66 bpm  Echocardiogram 04/10/2018 Study Conclusions  - Left ventricle: The cavity size was normal. Wall thickness was   increased in a pattern of mild LVH. Systolic function was normal.   The estimated ejection fraction was in the range of 60% to 65%.   Wall motion was normal; there were no regional wall motion   abnormalities. Doppler parameters are consistent with abnormal   left ventricular relaxation (grade 1 diastolic dysfunction). -  Left atrium: Volume/bsa, ES, (1-plane Simpson&'s, A2C): 35.2   ml/m^2. - Right ventricle: The cavity size was mildly dilated. Wall   thickness was normal.  Assessment & Plan   1.  Essential hypertension-BP today 134/70 Continue amlodipine 5 mg twice daily Continue hydralazine 100 mg tablet 3 times daily Continue hydralazine 50 mg tablet twice daily Continue metoprolol tartrate 25 mg tablet twice daily Increase physical activity as tolerated Low-sodium heart healthy diet  2.  Chronic diastolic heart  failure-echocardiogram 04/10/2018 showed LVEF 60 to 65% with grade 1 diastolic dysfunction and left ventricle mild dilation Continue furosemide 20 mg tablet daily tablet daily Continue potassium chloride20 mEq as needed Continue Spironolactone 25 mg tablet daily Increase physical activity as tolerated Low-sodium heart healthy diet  3.  Hyperlipidemia-09/04/2018: Cholesterol 153; HDL 25.60; LDL Cholesterol 97; Triglycerides 154.0; VLDL 30.8 Continue atorvastatin 40 mg tablet daily  4.  Pulmonary hypertension-shown on CT scan 03/30/2018.  No increase shortness of breath with activity.  Followed by pulmonary Continue CPAP use. Increase physical activity as tolerated Low-sodium heart healthy diet Continue weight loss  5.  Coronary artery disease-no chest pain today.  She did have one episode of chest pain 2 weeks ago that she described as chest pressure but has not had any recurrent episodes. Continue aspirin 81 mg tablet daily Continue atorvastatin 40 mg tablet daily Increase physical activity as tolerated  Low-sodium heart healthy diet  Disposition: Follow-up with Dr. Stanford Breed in 6 months.  Deberah Pelton, NP-C 05/17/2019, 10:23 AM

## 2019-05-17 NOTE — Patient Instructions (Signed)
Medication Instructions:  Your physician recommends that you continue on your current medications as directed. Please refer to the Current Medication list given to you today. If you need a refill on your cardiac medications before your next appointment, please call your pharmacy.   Lab work: NONE  If you have labs (blood work) drawn today and your tests are completely normal, you will receive your results only by: Marland Kitchen MyChart Message (if you have MyChart) OR . A paper copy in the mail If you have any lab test that is abnormal or we need to change your treatment, we will call you to review the results.  Testing/Procedures: NONE  Follow-Up: At [facility], you and your health needs are our priority.  As part of our continuing mission to provide you with exceptional heart care, we have created designated Provider Care Teams.  These Care Teams include your primary Cardiologist (physician) and Advanced Practice Providers (APPs -  Physician Assistants and Nurse Practitioners) who all work together to provide you with the care you need, when you need it. You will need a follow up appointment in 6 months.  Please call our office 2 months in advance to schedule this appointment.  You may see Kirk Ruths, MD or one of the following Advanced Practice Providers on your designated Care Team:   Kerin Ransom, PA-C Roby Lofts, [place] . Sande Rives, PA-C  Any Other Special Instructions Will Be Listed Below (If Applicable).

## 2019-05-18 ENCOUNTER — Telehealth: Payer: Self-pay | Admitting: Gastroenterology

## 2019-05-18 NOTE — Telephone Encounter (Signed)
Dr. Loletha Carrow, per the med list, this patient has been on this medication since at least  March of 2020 (prescribed by her PCP), she is concerned about the possibility of liver damage in conjunction with her cirrhosis. She wants your approval to continue to take this medication despite her cirrhosis dx. Please advise. (Last OV with you 04/20/2018)

## 2019-05-18 NOTE — Telephone Encounter (Signed)
Pt reported that she was prescribed atorvastatin by PCP and drug information states: can cause liver damage.  Pt has cirrhosis of liver and would like approval from Dr. Loletha Carrow to take this drug.  Pt also inquired about scheduling her next CT.

## 2019-05-20 ENCOUNTER — Other Ambulatory Visit: Payer: Self-pay | Admitting: *Deleted

## 2019-05-20 DIAGNOSIS — K7460 Unspecified cirrhosis of liver: Secondary | ICD-10-CM

## 2019-05-20 NOTE — Telephone Encounter (Signed)
Dr. Loletha Carrow, please advise concerning my earlier message and the message below. Thank you.

## 2019-05-20 NOTE — Telephone Encounter (Signed)
Pt is calling regarding this msg also would like to know if it she needs to get an ultrasound on her liver.  Please advise

## 2019-05-20 NOTE — Telephone Encounter (Signed)
Apologies for the delayed response - I am out of the office on the hospital consult service this week.  I will message her PCP to open a dialogue about the atorvastatin. It is a question of how stongly it is indicated and whether there are any alternate med choices.  Regarding her cirrhosis, it has been over a year since I last saw her.  I am glad she asked the question of follow-up testing.  Please arrange PT/INR, alpha-fetoprotein, and right upper quadrant ultrasound.  She then needs an office visit to follow-up with me after those are done so we can review results and update her condition.

## 2019-05-21 NOTE — Telephone Encounter (Signed)
Spoke with the patient who expressed a strong desire for Dr. Loletha Carrow to know that she has not been taking the Atorvastatin because of the fear of this medication causing liver damage and she is appreciative of Dr. Loletha Carrow' effort to collaborate with Dr. Quay Burow concerning the best way to handle going forward with this medication.    PT/INR, AFP, RUQ [place] (9/30 at 9 am [[facility]], NPO at midnight [pt aware]) orders in Epic.   OV scheduled with Dr. Loletha Carrow on 10/28 at 3:40 pm.

## 2019-05-22 ENCOUNTER — Ambulatory Visit (INDEPENDENT_AMBULATORY_CARE_PROVIDER_SITE_OTHER): Payer: Medicare HMO

## 2019-05-22 DIAGNOSIS — Z23 Encounter for immunization: Secondary | ICD-10-CM

## 2019-05-24 DIAGNOSIS — H2513 Age-related nuclear cataract, bilateral: Secondary | ICD-10-CM | POA: Diagnosis not present

## 2019-05-25 DIAGNOSIS — J449 Chronic obstructive pulmonary disease, unspecified: Secondary | ICD-10-CM | POA: Diagnosis not present

## 2019-05-26 ENCOUNTER — Ambulatory Visit (HOSPITAL_COMMUNITY)
Admission: RE | Admit: 2019-05-26 | Discharge: 2019-05-26 | Disposition: A | Discharge status: Home / Self Care | Payer: Medicare HMO | Source: Ambulatory Visit | Attending: Gastroenterology | Admitting: Gastroenterology

## 2019-05-26 ENCOUNTER — Other Ambulatory Visit: Payer: Self-pay | Admitting: *Deleted

## 2019-05-26 ENCOUNTER — Telehealth: Payer: Self-pay | Admitting: Gastroenterology

## 2019-05-26 ENCOUNTER — Other Ambulatory Visit: Payer: Self-pay

## 2019-05-26 DIAGNOSIS — K7460 Unspecified cirrhosis of liver: Secondary | ICD-10-CM | POA: Insufficient documentation

## 2019-05-26 DIAGNOSIS — K769 Liver disease, unspecified: Secondary | ICD-10-CM

## 2019-05-26 DIAGNOSIS — K7689 Other specified diseases of liver: Secondary | ICD-10-CM | POA: Diagnosis not present

## 2019-05-26 NOTE — Telephone Encounter (Signed)
Spoke to the patient who has been scheduled at [facility] radiology for a CT abdomen on Wed 10/7 at 12:30 pm. Patient to be NPO for 4 hours prior. Patient aware of all instructions. Verbalized understanding. No other complaints or concerns at the time of the call.   BUN/Creatinine orders placed in Epic. Patient will be in for labs tomorrow.

## 2019-05-26 NOTE — Telephone Encounter (Signed)
[facility], spoke with Marzetta Board wanted to report based on the results of the [place], additional imaging was recommended. Please review results.

## 2019-05-26 NOTE — Telephone Encounter (Signed)
Yes, I have reviewed the report.  The radiologist notes a lesion in the liver that may be a cyst, but they cannot be certain because it is small.  What ever it is appears to have increased in size somewhat from a year ago.  They recommend a CT scan of the liver for further evaluation.  If patient agreeable, please schedule a CT scan of the abdomen (no pelvis) with and without IV contrast, no oral contrast. Indication is liver lesion seen on ultrasound, patient with underlying cirrhosis.

## 2019-05-28 ENCOUNTER — Other Ambulatory Visit (INDEPENDENT_AMBULATORY_CARE_PROVIDER_SITE_OTHER): Payer: Medicare HMO

## 2019-05-28 DIAGNOSIS — K769 Liver disease, unspecified: Secondary | ICD-10-CM

## 2019-05-28 DIAGNOSIS — K7460 Unspecified cirrhosis of liver: Secondary | ICD-10-CM

## 2019-05-28 LAB — PROTIME-INR
INR: 1.2 ratio — ABNORMAL HIGH (ref 0.8–1.0)
Prothrombin Time: 13.6 s — ABNORMAL HIGH (ref 9.6–13.1)

## 2019-05-28 LAB — BUN: BUN: 25 mg/dL — ABNORMAL HIGH (ref 6–23)

## 2019-05-28 LAB — CREATININE, SERUM: Creatinine, Ser: 1.18 mg/dL (ref 0.40–1.20)

## 2019-05-31 LAB — AFP TUMOR MARKER: AFP-Tumor Marker: 2.6 ng/mL

## 2019-06-02 ENCOUNTER — Ambulatory Visit (HOSPITAL_COMMUNITY)
Admission: RE | Admit: 2019-06-02 | Discharge: 2019-06-02 | Disposition: A | Discharge status: Home / Self Care | Payer: Medicare HMO | Source: Ambulatory Visit | Attending: Gastroenterology | Admitting: Gastroenterology

## 2019-06-02 ENCOUNTER — Encounter (HOSPITAL_COMMUNITY): Payer: Self-pay

## 2019-06-02 ENCOUNTER — Other Ambulatory Visit: Payer: Self-pay

## 2019-06-02 DIAGNOSIS — K769 Liver disease, unspecified: Secondary | ICD-10-CM | POA: Diagnosis present

## 2019-06-02 DIAGNOSIS — K7460 Unspecified cirrhosis of liver: Secondary | ICD-10-CM | POA: Diagnosis not present

## 2019-06-02 MED ORDER — SODIUM CHLORIDE (PF) 0.9 % IJ SOLN
INTRAMUSCULAR | Status: AC
  Filled 2019-06-02: qty 50

## 2019-06-02 MED ORDER — IOHEXOL 300 MG/ML  SOLN
100.0000 mL | Freq: Once | INTRAMUSCULAR | Status: AC | PRN
  Administered 2019-06-02: 100 mL via INTRAVENOUS

## 2019-06-12 DIAGNOSIS — J449 Chronic obstructive pulmonary disease, unspecified: Secondary | ICD-10-CM | POA: Diagnosis not present

## 2019-06-15 NOTE — Progress Notes (Signed)
Virtual Visit via Video Note  I connected with Mary Acosta on 06/16/19 at  2:00 PM EDT by a video enabled telemedicine application and verified that I am speaking with the correct person using two identifiers.   I discussed the limitations of evaluation and management by telemedicine and the availability of in person appointments. The patient expressed understanding and agreed to proceed.  The patient is currently at home and I am in the office.    No referring provider.    History of Present Illness: She is here for follow up of her chronic medical conditions.   Hypertension: She is taking her medication daily. She is compliant with a low sodium diet.  She denies chest pain, palpitations, shortness of breath and regular headaches. She does monitor her blood pressure at home - 150/85.     Diabetes: She is taking her medication daily as prescribed. She is compliant with a diabetic diet.  She monitors her sugars and they have been running 180's. She denies numbness/tingling in her feet and foot lesions. She is up-to-date with an ophthalmology examination.   Hyperlipidemia: She is taking her medication daily. She is compliant with a low fat/cholesterol diet. She denies myalgias.   Hypothyroidism:  She is taking her medication daily.  She denies any recent changes in energy or weight that are unexplained.   Grief, depression, anxiety:  She is taking her medication as prescribed.  She is still struggling with the death of her grandson.  She is seeing the psychiatrist.     Review of Systems  Constitutional: Negative for chills and fever.  Respiratory: Negative for cough, shortness of breath and wheezing.   Cardiovascular: Positive for leg swelling. Negative for chest pain and palpitations.  Neurological: Negative for dizziness and headaches.  Psychiatric/Behavioral: Positive for substance abuse. The patient is nervous/anxious.      Social History   Socioeconomic History  .  Marital status: Married    Spouse name: Not on file  . Number of children: 2  . Years of education: 59  . Highest education level: Not on file  Occupational History  . Occupation: disabled    Fish farm manager: UNEMPLOYED  Social Needs  . Financial resource strain: Not on file  . Food insecurity    Worry: Not on file    Inability: Not on file  . Transportation needs    Medical: Not on file    Non-medical: Not on file  Tobacco Use  . Smoking status: Former Smoker    Packs/day: 2.50    Years: 39.00    Pack years: 97.50    Types: Cigarettes    Quit date: 10/22/2010    Years since quitting: 8.6  . Smokeless tobacco: Never Used  Substance and Sexual Activity  . Alcohol use: No    Alcohol/week: 0.0 standard drinks  . Drug use: No  . Sexual activity: Not Currently  Lifestyle  . Physical activity    Days per week: Not on file    Minutes per session: Not on file  . Stress: Not on file  Relationships  . Social Herbalist on phone: Not on file    Gets together: Not on file    Attends religious service: Not on file    Active member of club or organization: Not on file    Attends meetings of clubs or organizations: Not on file    Relationship status: Not on file  Other Topics Concern  . Not on file  Social History Narrative   Lives at home w/ her husband and grandson   Right-handed   Caffeine: 1 cup of coffee per week + Pepsi     Observations/Objective: Appears well in NAD   Assessment and Plan:  See Problem List for Assessment and Plan of chronic medical problems.   Follow Up Instructions:    I discussed the assessment and treatment plan with the patient. The patient was provided an opportunity to ask questions and all were answered. The patient agreed with the plan and demonstrated an understanding of the instructions.   The patient was advised to call back or seek an in-person evaluation if the symptoms worsen or if the condition fails to improve as  anticipated.    Binnie Rail, MD

## 2019-06-16 ENCOUNTER — Ambulatory Visit (INDEPENDENT_AMBULATORY_CARE_PROVIDER_SITE_OTHER): Payer: Medicare HMO | Admitting: Internal Medicine

## 2019-06-16 ENCOUNTER — Encounter: Payer: Self-pay | Admitting: Internal Medicine

## 2019-06-16 DIAGNOSIS — E782 Mixed hyperlipidemia: Secondary | ICD-10-CM

## 2019-06-16 DIAGNOSIS — E039 Hypothyroidism, unspecified: Secondary | ICD-10-CM

## 2019-06-16 DIAGNOSIS — F411 Generalized anxiety disorder: Secondary | ICD-10-CM

## 2019-06-16 DIAGNOSIS — E1042 Type 1 diabetes mellitus with diabetic polyneuropathy: Secondary | ICD-10-CM | POA: Diagnosis not present

## 2019-06-16 DIAGNOSIS — R69 Illness, unspecified: Secondary | ICD-10-CM | POA: Diagnosis not present

## 2019-06-16 DIAGNOSIS — I10 Essential (primary) hypertension: Secondary | ICD-10-CM | POA: Diagnosis not present

## 2019-06-16 DIAGNOSIS — F4321 Adjustment disorder with depressed mood: Secondary | ICD-10-CM

## 2019-06-16 DIAGNOSIS — F329 Major depressive disorder, single episode, unspecified: Secondary | ICD-10-CM

## 2019-06-16 DIAGNOSIS — F32A Depression, unspecified: Secondary | ICD-10-CM

## 2019-06-16 NOTE — Assessment & Plan Note (Signed)
Sugars better controlled at home - 180's Following with Dr Loanne Drilling

## 2019-06-16 NOTE — Assessment & Plan Note (Signed)
Continue statin. 

## 2019-06-16 NOTE — Assessment & Plan Note (Signed)
Following with psychiatry -  Management per psych She is talking to a therapist Still struggling with depression, anxiety and grief

## 2019-06-16 NOTE — Addendum Note (Signed)
Addended by: Binnie Rail on: 06/16/2019 02:45 PM   Modules accepted: Orders

## 2019-06-16 NOTE — Assessment & Plan Note (Signed)
BP Readings from Last 3 Encounters:  05/17/19 134/70  03/16/19 (!) 178/72  10/21/18 (!) 160/72   Bp at home 150/85 recently Continue current medications at current doses

## 2019-06-16 NOTE — Assessment & Plan Note (Signed)
Management per Dr. Ellison 

## 2019-06-19 DIAGNOSIS — E119 Type 2 diabetes mellitus without complications: Secondary | ICD-10-CM | POA: Diagnosis not present

## 2019-06-21 DIAGNOSIS — J449 Chronic obstructive pulmonary disease, unspecified: Secondary | ICD-10-CM | POA: Diagnosis not present

## 2019-06-23 ENCOUNTER — Other Ambulatory Visit: Payer: Self-pay

## 2019-06-23 ENCOUNTER — Ambulatory Visit: Payer: Medicare HMO | Admitting: Gastroenterology

## 2019-06-23 ENCOUNTER — Encounter: Payer: Self-pay | Admitting: Gastroenterology

## 2019-06-23 VITALS — BP 162/98 | HR 69 | Temp 97.6°F | Ht 69.0 in | Wt 288.4 lb

## 2019-06-23 DIAGNOSIS — R140 Abdominal distension (gaseous): Secondary | ICD-10-CM

## 2019-06-23 DIAGNOSIS — K7469 Other cirrhosis of liver: Secondary | ICD-10-CM | POA: Diagnosis not present

## 2019-06-23 DIAGNOSIS — R609 Edema, unspecified: Secondary | ICD-10-CM

## 2019-06-23 DIAGNOSIS — R933 Abnormal findings on diagnostic imaging of other parts of digestive tract: Secondary | ICD-10-CM | POA: Diagnosis not present

## 2019-06-23 DIAGNOSIS — R600 Localized edema: Secondary | ICD-10-CM

## 2019-06-23 NOTE — Patient Instructions (Addendum)
If you are age 63 or older, your body mass index should be between 23-30. Your Body mass index is 42.59 kg/m. If this is out of the aforementioned range listed, please consider follow up with your Primary Care Provider.  If you are age 15 or younger, your body mass index should be between 19-25. Your Body mass index is 42.59 kg/m. If this is out of the aformentioned range listed, please consider follow up with your Primary Care Provider.   Please restart your Furosamide, take daily!  It was a pleasure to see you today!  Dr. Loletha Carrow

## 2019-06-23 NOTE — Progress Notes (Addendum)
[facility] Progress Note  Chief Complaint: Cryptogenic cirrhosis  Subjective  History:  Mary Acosta follows up for the first time in over a year.  She has cryptogenic cirrhosis, most likely related to fatty liver with underlying obesity and diabetes. She continues to struggle with chronic pain and is still on chronic opioids for that.  She has intermittent nausea and previous endoscopy has showed retained food consistent with gastroparesis from opioids and diabetes. She has also been struggling with situational depression this year since the loss of her 63 year old grandchild from an as yet unknown sudden illness several months ago.  Mary Acosta is bothered by swelling in her feet and lower legs.  She does not take spironolactone because she believes it causes her leg pain to be worse.  She also does not take furosemide out of concern it might decrease her potassium level.  It sounds like she still has multiple episodes of high glucose readings requiring additional insulin dosing.   Last EGD Jan 2018-no varices, visualization of stomach limited by retained food.  ROS: Chronic multifocal musculoskeletal pain. Dysphoria Poor sleep Remainder of systems negative except as above  The patient's Past Medical, Family and Social History were reviewed and are on file in the EMR.  Objective:  Med list reviewed  Current Outpatient Medications:  .  albuterol (VENTOLIN HFA) 108 (90 Base) MCG/ACT inhaler, TAKE 2 PUFFS BY MOUTH EVERY 6 HOURS AS NEEDED FOR WHEEZE OR SHORTNESS OF BREATH, Disp: 18 g, Rfl: 11 .  amLODipine (NORVASC) 5 MG tablet, TAKE 1 TABLET BY MOUTH TWICE A DAY, Disp: 180 tablet, Rfl: 1 .  aspirin EC 81 MG tablet, Take 1 tablet (81 mg total) by mouth daily., Disp: 90 tablet, Rfl: 3 .  hydrALAZINE (APRESOLINE) 100 MG tablet, Take 1 tablet (100 mg total) by mouth 3 (three) times daily. (Patient taking differently: Take 100 mg by mouth 2 (two) times daily. ), Disp: 90 tablet, Rfl: 5 .   insulin NPH Human (NOVOLIN N RELION) 100 UNIT/ML injection, Inject 1.9 mLs (190 Units total) into the skin every morning. And syringes 1/day, Disp: 60 mL, Rfl: 11 .  Insulin Syringe-Needle U-100 (INSULIN SYRINGE 1CC/30GX5/16") 30G X 5/16" 1 ML MISC, Use as directed for insulin injections 5 times daily, Disp: 150 each, Rfl: 1 .  ipratropium-albuterol (DUONEB) 0.5-2.5 (3) MG/3ML SOLN, Take 3 mLs by nebulization every 6 (six) hours as needed (Wheezing or dyspnea.)., Disp: , Rfl:  .  levothyroxine (SYNTHROID) 175 MCG tablet, TAKE 1 TABLET BY MOUTH SIX DAYS A WEEK BEFORE BREAKFAST, Disp: 90 tablet, Rfl: 1 .  Lidocaine HCl (ASPERCREME LIDOCAINE) 4 % LIQD, Apply 1 application topically as needed (pain). , Disp: , Rfl:  .  meloxicam (MOBIC) 7.5 MG tablet, Take 15 mg by mouth daily. , Disp: , Rfl: 5 .  metoprolol tartrate (LOPRESSOR) 25 MG tablet, Take 1 tablet (25 mg total) by mouth 2 (two) times daily., Disp: 180 tablet, Rfl: 3 .  mirtazapine (REMERON) 15 MG tablet, Take 15 mg by mouth at bedtime., Disp: , Rfl:  .  morphine (MS CONTIN) 30 MG 12 hr tablet, Take 30 mg by mouth 2 (two) times daily., Disp: , Rfl:  .  morphine (MSIR) 15 MG tablet, Take 15 mg by mouth daily., Disp: , Rfl:  .  ondansetron (ZOFRAN-ODT) 4 MG disintegrating tablet, Take 1 tablet (4 mg total) by mouth every 6 (six) hours as needed for nausea or vomiting., Disp: 30 tablet, Rfl: 3 .  potassium chloride  SA (KLOR-CON M20) 20 MEQ tablet, TAKE 2 TABLETS EVERY DAY AS NEEDED FOR CRAMPING, Disp: 180 tablet, Rfl: 1 .  spironolactone (ALDACTONE) 25 MG tablet, Take 1 tablet (25 mg total) by mouth daily. (Patient taking differently: Take 25 mg by mouth daily. CAUSES PAIN ONLY TAKES WHEN BP IS ELEVATED), Disp: 30 tablet, Rfl: 5 .  tiZANidine (ZANAFLEX) 2 MG tablet, Take 2 mg by mouth as needed for muscle spasms. , Disp: , Rfl: 2 .  furosemide (LASIX) 20 MG tablet, TAKE 1 TABLET BY MOUTH EVERY DAY (Patient not taking: Reported on 06/23/2019), Disp:  90 tablet, Rfl: 2   Vital signs in last 24 hrs: Vitals:   06/23/19 1540  BP: (!) 162/98  Pulse: 69  Temp: 97.6 F (36.4 C)    Physical Exam  Chronically ill-appearing as before.  Flattened affect, antalgic gait.  Gets on exam table slowly but without assistance.  HEENT: sclera anicteric, oral mucosa moist without lesions  Neck: supple, no thyromegaly, JVD or lymphadenopathy  Cardiac: RRR without murmurs, S1S2 heard, pitting edema to knee bilaterally with some venous stasis changes.  Pulm: clear to auscultation bilaterally, normal RR and effort noted  Abdomen: soft, no tenderness, with active bowel sounds.  Cannot assess mass or hepatosplenomegaly due to body habitus/morbidly obese.  Skin; no jaundice.  Neuro: Normal gross motor function, no asterixis, speech slow but fluent  Recent Labs:  CMP Latest Ref Rng & Units 05/28/2019 04/01/2019 09/04/2018  Glucose 70 - 99 mg/dL - 216(H) 300(H)  BUN 6 - 23 mg/dL 25(H) 16 12  Creatinine 0.40 - 1.20 mg/dL 1.18 0.74 0.94  Sodium 135 - 145 mEq/L - 141 135  Potassium 3.5 - 5.1 mEq/L - 3.5 4.9  Chloride 96 - 112 mEq/L - 102 97  CO2 19 - 32 mEq/L - 32 30  Calcium 8.4 - 10.5 mg/dL - 8.9 9.2  Total Protein 6.0 - 8.3 g/dL - 6.8 7.0  Total Bilirubin 0.2 - 1.2 mg/dL - 0.5 0.5  Alkaline Phos 39 - 117 U/L - 94 111  AST 0 - 37 U/L - 29 28  ALT 0 - 35 U/L - 21 20   INR 1.2  Hgb A1c = 8.8  Recent AFP 2.6  Hemoglobin A1c got as high as 10.8 earlier this year, more recently down to 8.8  Radiologic studies:  CLINICAL DATA:  Cirrhosis, follow-up   EXAM: ULTRASOUND ABDOMEN LIMITED RIGHT UPPER QUADRANT   COMPARISON:  05/04/2018   FINDINGS: Gallbladder:   Post cholecystectomy.   Common bile duct:   Diameter: 5 mm, no intrahepatic biliary ductal dilation.   Liver:   Coarse echotexture with nodular contour. Anechoic area in the left hepatic lobe measuring 0.9 x 0.7 x 0.9 cm within 1-2 mm of previous size and continuing to show  increased through transmission. Heterogeneity around this area may be artifactual but there is increased echogenicity at the margin, the near margin and the lateral margin. Portal vein is patent on color Doppler imaging with normal direction of blood flow towards the liver.   Other: None.   IMPRESSION: Coarsened echotexture compatible with cirrhosis.   Cyst present in left hepatic lobe but with areas of heterogeneity around this lesion, consider CT assessment for further evaluation, to establish baseline as no cysts were seen on more remote imaging aside from most recent priors.     Electronically Signed   By: Zetta Bills M.D.   On: 05/26/2019 09:12 _________________________________________________  CLINICAL DATA:  Liver lesion on ultrasound,  cirrhosis. History of thyroid cancer.   EXAM: CT ABDOMEN WITHOUT AND WITH CONTRAST   TECHNIQUE: Multidetector CT imaging of the abdomen was performed following the standard protocol before and following the bolus administration of intravenous contrast.   CONTRAST:  177mL OMNIPAQUE IOHEXOL 300 MG/ML  SOLN   COMPARISON:  Abdominal ultrasound 05/26/2019, CT chest 03/30/2018 and CT abdomen pelvis 09/06/2015.   FINDINGS: Lower chest: Lung bases are clear. Heart size normal. No pericardial or pleural effusion. Distal esophagus is unremarkable.   Hepatobiliary: Liver is decreased in attenuation diffusely and the margin is irregular. A 7 mm low-density lesion in the periphery of the left hepatic lobe (4/55 and 9/56) likely corresponds to the questioned abnormality on recent ultrasound and does not show definitive arterial phase enhancement. Cholecystectomy. Mild prominence of the common bile duct is likely in proportion.   Pancreas: Negative.   Spleen: Measures 13.9 cm. Subcentimeter low-attenuation lesion in the anterior spleen (4/26), too small to characterize.   Adrenals/Urinary Tract: Adrenal glands are unremarkable. No  urinary stones. Nonenhancing low-attenuation lesion in the interpolar right kidney measures 8 mm, indicative of a cyst. Subcentimeter low-attenuation lesion in the lower pole right kidney is too small to characterize but statistically, a cyst is likely. Kidneys are otherwise unremarkable. Proximal ureters are decompressed.   Stomach/Bowel: Laparoscopic band is seen around the proximal stomach. Stomach and visualized portions of the small bowel and colon are unremarkable.   Vascular/Lymphatic: Atherosclerotic calcification of the aorta without aneurysm. Periportal lymph nodes measure up to 12 mm, as before. Retroperitoneal lymph nodes measure up to 9 mm in the aortocaval station, stable.   Other: No free fluid.   Musculoskeletal: Degenerative changes in the spine. No worrisome lytic or sclerotic lesions.   IMPRESSION: 1. 7 mm low-density lesion in the periphery of the left hepatic lobe does not show definitive arterial phase enhancement but is too small for definitive characterization. If further evaluation is desired, MR abdomen without and with contrast is recommended. 2. Cirrhosis. 3. Splenomegaly. 4.  Aortic atherosclerosis (ICD10-170.0).     Electronically Signed   By: Lorin Picket M.D.   On: 06/02/2019 13:56   @ASSESSMENTPLANBEGIN @ Assessment: Encounter Diagnoses  Name Primary?  . Other cirrhosis of liver ([place]) Yes  . Abdominal bloating   . Peripheral edema   . Abnormal finding on GI tract imaging    Cirrhosis, compensated.  No encephalopathy, no varices on last exam or ascites on imaging.  Peripheral edema likely combination of portal hypertension and obesity.  I recommended she take her low-dose furosemide daily.  Follow closely with primary care regarding any need for potassium supplementation in conjunction with her other complex medical issues and medication management.  Benign appearing small cystic liver lesion, reassuring normal AFP.  Not likely [place].  Plan: Follow-up in 6 months. Long-term prognosis poor due to multiple chronic medical issues, most notably poorly controlled diabetes.  Fortunately, her liver disease has not decompensated, because she would not be a liver transplant candidate.  Total time 30 minutes, over half spent face-to-face with patient in counseling and coordination of care.   [name]

## 2019-06-28 DIAGNOSIS — H35033 Hypertensive retinopathy, bilateral: Secondary | ICD-10-CM | POA: Diagnosis not present

## 2019-06-28 DIAGNOSIS — H43813 Vitreous degeneration, bilateral: Secondary | ICD-10-CM | POA: Diagnosis not present

## 2019-06-28 DIAGNOSIS — H353231 Exudative age-related macular degeneration, bilateral, with active choroidal neovascularization: Secondary | ICD-10-CM | POA: Diagnosis not present

## 2019-06-28 DIAGNOSIS — H35423 Microcystoid degeneration of retina, bilateral: Secondary | ICD-10-CM | POA: Diagnosis not present

## 2019-06-29 DIAGNOSIS — G894 Chronic pain syndrome: Secondary | ICD-10-CM | POA: Diagnosis not present

## 2019-06-29 DIAGNOSIS — M5137 Other intervertebral disc degeneration, lumbosacral region: Secondary | ICD-10-CM | POA: Diagnosis not present

## 2019-06-29 DIAGNOSIS — M533 Sacrococcygeal disorders, not elsewhere classified: Secondary | ICD-10-CM | POA: Diagnosis not present

## 2019-06-29 DIAGNOSIS — Z5181 Encounter for therapeutic drug level monitoring: Secondary | ICD-10-CM | POA: Diagnosis not present

## 2019-06-30 DIAGNOSIS — Z634 Disappearance and death of family member: Secondary | ICD-10-CM | POA: Diagnosis not present

## 2019-06-30 DIAGNOSIS — R69 Illness, unspecified: Secondary | ICD-10-CM | POA: Diagnosis not present

## 2019-06-30 DIAGNOSIS — F419 Anxiety disorder, unspecified: Secondary | ICD-10-CM | POA: Diagnosis not present

## 2019-06-30 DIAGNOSIS — G894 Chronic pain syndrome: Secondary | ICD-10-CM | POA: Diagnosis not present

## 2019-06-30 DIAGNOSIS — F4310 Post-traumatic stress disorder, unspecified: Secondary | ICD-10-CM | POA: Diagnosis not present

## 2019-07-06 ENCOUNTER — Other Ambulatory Visit: Payer: Self-pay

## 2019-07-06 ENCOUNTER — Encounter (HOSPITAL_COMMUNITY): Payer: Self-pay | Admitting: Emergency Medicine

## 2019-07-06 ENCOUNTER — Emergency Department (HOSPITAL_COMMUNITY): Payer: Medicare HMO

## 2019-07-06 ENCOUNTER — Observation Stay (HOSPITAL_COMMUNITY)
Admission: EM | Admit: 2019-07-06 | Discharge: 2019-07-07 | Disposition: A | Discharge status: Home / Self Care | Payer: Medicare HMO | Attending: Internal Medicine | Admitting: Internal Medicine

## 2019-07-06 DIAGNOSIS — R0789 Other chest pain: Principal | ICD-10-CM | POA: Insufficient documentation

## 2019-07-06 DIAGNOSIS — E782 Mixed hyperlipidemia: Secondary | ICD-10-CM

## 2019-07-06 DIAGNOSIS — Z85850 Personal history of malignant neoplasm of thyroid: Secondary | ICD-10-CM | POA: Insufficient documentation

## 2019-07-06 DIAGNOSIS — E890 Postprocedural hypothyroidism: Secondary | ICD-10-CM | POA: Insufficient documentation

## 2019-07-06 DIAGNOSIS — R9431 Abnormal electrocardiogram [ECG] [EKG]: Secondary | ICD-10-CM | POA: Diagnosis not present

## 2019-07-06 DIAGNOSIS — G894 Chronic pain syndrome: Secondary | ICD-10-CM | POA: Diagnosis not present

## 2019-07-06 DIAGNOSIS — I10 Essential (primary) hypertension: Secondary | ICD-10-CM | POA: Diagnosis not present

## 2019-07-06 DIAGNOSIS — J449 Chronic obstructive pulmonary disease, unspecified: Secondary | ICD-10-CM | POA: Diagnosis not present

## 2019-07-06 DIAGNOSIS — R0602 Shortness of breath: Secondary | ICD-10-CM | POA: Diagnosis not present

## 2019-07-06 DIAGNOSIS — Z7982 Long term (current) use of aspirin: Secondary | ICD-10-CM | POA: Insufficient documentation

## 2019-07-06 DIAGNOSIS — Z79899 Other long term (current) drug therapy: Secondary | ICD-10-CM | POA: Diagnosis not present

## 2019-07-06 DIAGNOSIS — Z87891 Personal history of nicotine dependence: Secondary | ICD-10-CM | POA: Diagnosis not present

## 2019-07-06 DIAGNOSIS — R69 Illness, unspecified: Secondary | ICD-10-CM | POA: Diagnosis not present

## 2019-07-06 DIAGNOSIS — Z96652 Presence of left artificial knee joint: Secondary | ICD-10-CM | POA: Insufficient documentation

## 2019-07-06 DIAGNOSIS — K7460 Unspecified cirrhosis of liver: Secondary | ICD-10-CM | POA: Diagnosis present

## 2019-07-06 DIAGNOSIS — E1169 Type 2 diabetes mellitus with other specified complication: Secondary | ICD-10-CM | POA: Diagnosis present

## 2019-07-06 DIAGNOSIS — J45909 Unspecified asthma, uncomplicated: Secondary | ICD-10-CM | POA: Diagnosis not present

## 2019-07-06 DIAGNOSIS — Z20828 Contact with and (suspected) exposure to other viral communicable diseases: Secondary | ICD-10-CM | POA: Insufficient documentation

## 2019-07-06 DIAGNOSIS — E1159 Type 2 diabetes mellitus with other circulatory complications: Secondary | ICD-10-CM | POA: Diagnosis present

## 2019-07-06 DIAGNOSIS — R079 Chest pain, unspecified: Secondary | ICD-10-CM | POA: Diagnosis not present

## 2019-07-06 DIAGNOSIS — E1042 Type 1 diabetes mellitus with diabetic polyneuropathy: Secondary | ICD-10-CM

## 2019-07-06 DIAGNOSIS — F411 Generalized anxiety disorder: Secondary | ICD-10-CM

## 2019-07-06 DIAGNOSIS — E669 Obesity, unspecified: Secondary | ICD-10-CM | POA: Diagnosis present

## 2019-07-06 DIAGNOSIS — E662 Morbid (severe) obesity with alveolar hypoventilation: Secondary | ICD-10-CM | POA: Diagnosis present

## 2019-07-06 DIAGNOSIS — R072 Precordial pain: Secondary | ICD-10-CM | POA: Diagnosis not present

## 2019-07-06 DIAGNOSIS — Z794 Long term (current) use of insulin: Secondary | ICD-10-CM | POA: Diagnosis not present

## 2019-07-06 DIAGNOSIS — E109 Type 1 diabetes mellitus without complications: Secondary | ICD-10-CM | POA: Diagnosis not present

## 2019-07-06 DIAGNOSIS — E785 Hyperlipidemia, unspecified: Secondary | ICD-10-CM | POA: Diagnosis present

## 2019-07-06 DIAGNOSIS — E039 Hypothyroidism, unspecified: Secondary | ICD-10-CM | POA: Diagnosis present

## 2019-07-06 LAB — BASIC METABOLIC PANEL
Anion gap: 9 (ref 5–15)
BUN: 19 mg/dL (ref 8–23)
CO2: 30 mmol/L (ref 22–32)
Calcium: 8.9 mg/dL (ref 8.9–10.3)
Chloride: 98 mmol/L (ref 98–111)
Creatinine, Ser: 0.94 mg/dL (ref 0.44–1.00)
GFR calc Af Amer: >60 mL/min (ref >60)
GFR calc non Af Amer: >60 mL/min (ref >60)
Glucose, Bld: 158 mg/dL — ABNORMAL HIGH (ref 70–99)
Potassium: 3.9 mmol/L (ref 3.5–5.1)
Sodium: 137 mmol/L (ref 135–145)

## 2019-07-06 LAB — CBC
HCT: 41.8 % (ref 36.0–46.0)
HCT: 43.7 % (ref 36.0–46.0)
Hemoglobin: 12.4 g/dL (ref 12.0–15.0)
Hemoglobin: 12.9 g/dL (ref 12.0–15.0)
MCH: 25.2 pg — ABNORMAL LOW (ref 26.0–34.0)
MCH: 25 pg — ABNORMAL LOW (ref 26.0–34.0)
MCHC: 29.7 g/dL — ABNORMAL LOW (ref 30.0–36.0)
MCHC: 29.5 g/dL — ABNORMAL LOW (ref 30.0–36.0)
MCV: 84.8 fL (ref 80.0–100.0)
MCV: 84.9 fL (ref 80.0–100.0)
Platelets: 148 10*3/uL — ABNORMAL LOW (ref 150–400)
Platelets: 129 10*3/uL — ABNORMAL LOW (ref 150–400)
RBC: 4.93 MIL/uL (ref 3.87–5.11)
RBC: 5.15 MIL/uL — ABNORMAL HIGH (ref 3.87–5.11)
RDW: 15 % (ref 11.5–15.5)
RDW: 15 % (ref 11.5–15.5)
WBC: 8.1 10*3/uL (ref 4.0–10.5)
WBC: 6.8 10*3/uL (ref 4.0–10.5)
nRBC: 0 % (ref 0.0–0.2)
nRBC: 0 % (ref 0.0–0.2)

## 2019-07-06 LAB — LIPID PANEL
Cholesterol: 175 mg/dL (ref 0–200)
HDL: 38 mg/dL — ABNORMAL LOW (ref >40)
LDL Cholesterol: 122 mg/dL — ABNORMAL HIGH (ref 0–99)
Total CHOL/HDL Ratio: 4.6 RATIO
Triglycerides: 74 mg/dL (ref <150)
VLDL: 15 mg/dL (ref 0–40)

## 2019-07-06 LAB — TROPONIN I (HIGH SENSITIVITY)
Troponin I (High Sensitivity): 15 ng/L (ref <18)
Troponin I (High Sensitivity): 15 ng/L (ref <18)
Troponin I (High Sensitivity): 14 ng/L (ref <18)
Troponin I (High Sensitivity): 15 ng/L (ref <18)

## 2019-07-06 LAB — CREATININE, SERUM
Creatinine, Ser: 0.89 mg/dL (ref 0.44–1.00)
GFR calc Af Amer: >60 mL/min (ref >60)
GFR calc non Af Amer: >60 mL/min (ref >60)

## 2019-07-06 LAB — HIV ANTIBODY (ROUTINE TESTING W REFLEX): HIV Screen 4th Generation wRfx: NONREACTIVE

## 2019-07-06 MED ORDER — ONDANSETRON HCL 4 MG/2ML IJ SOLN: 4.0000 mg | Freq: Four times a day (QID) | INTRAMUSCULAR | Status: DC | PRN

## 2019-07-06 MED ORDER — ENOXAPARIN SODIUM 40 MG/0.4ML ~~LOC~~ SOLN: 40.0000 mg | SUBCUTANEOUS | Status: DC

## 2019-07-06 MED ORDER — ACETAMINOPHEN 325 MG PO TABS
650.0000 mg | ORAL_TABLET | ORAL | Status: DC | PRN
  Administered 2019-07-06: 650 mg via ORAL
  Filled 2019-07-06: qty 2

## 2019-07-06 MED ORDER — ENOXAPARIN SODIUM 60 MG/0.6ML ~~LOC~~ SOLN
60.0000 mg | SUBCUTANEOUS | Status: DC
  Administered 2019-07-07: 60 mg via SUBCUTANEOUS
  Filled 2019-07-06 (×2): qty 0.6

## 2019-07-06 MED ORDER — SODIUM CHLORIDE 0.9% FLUSH
3.0000 mL | Freq: Once | INTRAVENOUS | Status: AC
  Administered 2019-07-06: 3 mL via INTRAVENOUS

## 2019-07-06 MED ORDER — SODIUM CHLORIDE 0.9 % IV SOLN
INTRAVENOUS | Status: DC
  Administered 2019-07-06: via INTRAVENOUS

## 2019-07-06 NOTE — ED Triage Notes (Signed)
Pt in from home via [facility] with c/o bilateral neck, back and chest pain, sudden when she woke up this morning. Temp of 99.9 on ED arrival, denies cough, sob or fevers. States pain resolved soon after she got up from bed, then drove to the store and pain returned. States she called 911 and they told her to take ASA. States she took 6 ASA PTA and pain resolved. Endorses fall to center of chest 3 wks ago. Pain worse with movement

## 2019-07-06 NOTE — ED Notes (Signed)
Pt reports 10/10 pain similar to what she experienced this AM that prompted ED visit, requests home morphine dose, admitting MD aware

## 2019-07-06 NOTE — H&P (Signed)
History and Physical   Mary Acosta [id number] DOB: 1956/02/02 DOA: 07/06/2019  Referring MD/NP/PA: Dr. [name] Greaves  PCP: Binnie Rail, MD   Outpatient Specialists: None  Patient coming from: Home  Chief Complaint: Chest pain  HPI: Mary Acosta is a 63 y.o. female with medical history significant of anxiety disorder, osteoarthritis, morbid obesity, COPD, diabetes, fibromyalgia, chronic pain syndrome who presents to the ER with substernal chest pain.  Pain has been going on for 2 days.  Rated as 6 out of 10.  No radiation.  Mild shortness of breath but no cough.  Patient has risk factors for coronary artery disease including diabetes hypertension and hyperlipidemia.  She is therefore being admitted for MI rule out.  ED Course: Temperature 99.9 blood pressure 180/95 pulse 88 respirate 22 oxygen sat 81% on room air.  White count is 6.8 platelets 129 and normal hemoglobin.  Chemistry appeared to be within normal.  Initial troponin is 15.  COVID-19 screen is currently pending.  Chest x-ray showed no acute findings.  EKG showed no significant changes.  Patient is being admitted for observation and rule out MI.  Review of Systems: As per HPI otherwise 10 point review of systems negative.    Past Medical History:  Diagnosis Date   AKI (acute kidney injury) ([facility]) 01/2017   Anxiety    with panic attacks   Arthritis    "back; feet; hands; shoulders" (08/26/2014)   Asthma    Cervical cancer ([place])    Chronic lower back pain    Chronic narcotic use    Chronic pain syndrome    [facility]   Cirrhosis ([place])    Clostridium difficile infection 2017   COPD (chronic obstructive pulmonary disease) (H[facility])    Daily headache    Depression    Diabetic neuropathy ([place]) 06/04/2017   DJD (degenerative joint disease)    Fatty liver disease, nonalcoholic    Fibromyalgia    Frequency of urination    HCAP (healthcare-associated pneumonia) 08/26/2014    History of TIA (transient ischemic attack) 11-01-2010   NO RESIDUAL   Hyperlipidemia    Hypertension    Hypothyroidism    IDDM (insulin dependent diabetes mellitus)    Insomnia    Lumbar stenosis    Memory difficulty 07/25/2016   Nocturia    OSA (obstructive sleep apnea)    NO CPAP SINCE WT LOSS   Osteoarthritis    with severe disease in knee   Pneumonia "several times"   Polymyalgia rheumatica ([place])    Scoliosis    Seasonal allergies    Thyroid cancer ([place])    Urgency of urination    Vaginal pain S/P SLING  FEB 2012    Past Surgical History:  Procedure Laterality Date   APPENDECTOMY  1982   BREAST EXCISIONAL BIOPSY Left 02/28/2005   Atypical Ductal Hyperplasia   CARDIAC CATHETERIZATION  09-04-2004   NORMAL CORONARY ANATOMY/ NORMAL LVF/ EF 60%   CARDIOVASCULAR STRESS TEST  12-27-2010  DR [place]   ABNORMAL NUCLEAR STUDY W/ /MILD INFERIOR ISCHEMIA/ EF 69%/  CT HEART ANGIOGRAM ;  NO ACUTE FINDINGS   CRYOABLATION  05/16/2003   w/LEEP FOR ABNORMAL PAP SMEAR   CYSTOSCOPY  05/18/2012   Procedure: CYSTOSCOPY;  Surgeon: Reece Packer, MD;  Location: [place];  Service: Urology;  Laterality: N/A;  examination under anethesia   ESOPHAGOGASTRODUODENOSCOPY (EGD) WITH PROPOFOL N/A 09/03/2016   Procedure: ESOPHAGOGASTRODUODENOSCOPY (EGD) WITH PROPOFOL;  Surgeon: Mallie Mussel  Evern Bio, MD;  Location: [facility];  Service: Gastroenterology;  Laterality: N/A;   HYSTEROSCOPY W/D&C  08-19-2007   PMB   KNEE ARTHROSCOPY W/ DEBRIDEMENT Left 03/29/2006   INTERNAL DERANGEMENT/ SEVERE DJD/ MENISCUS TEARS   LAPAROSCOPIC CHOLECYSTECTOMY  06-10-2002   LAPAROSCOPIC GASTRIC BANDING  03/01/2006   TRUNCAL VAGOTOMY/ PLACEMENT OF VG BAND   REVISION TOTAL KNEE ARTHROPLASTY Left 08-29-2008; 05/2009   TONSILLECTOMY  1969   TOTAL KNEE ARTHROPLASTY Left 01-23-2007   SEVERE DJD   TOTAL THYROIDECTOMY  11-22-2005   BILATERAL THYROID NODULES-- PAPILLARY CARCINOMA  (0.5CM)/ ADENOMATOID NODULES   TRANSTHORACIC ECHOCARDIOGRAM  12-27-2010   LVSF NORMAL / EF [id number] GRADE I DIASTOLIC DYSFUNCTION/ MILD MITRAL REGURG. / MILDLY DILATED LEFT ATRIUM/ MILDY INCREASED SYSTOLIC PRESSURE OF PULMONARY ARTERIES   TRANSVAGINAL SUBURETERAL TAPE/ SLING  09-28-2010   MIXED URINARY INCONTINENCE   TUBAL LIGATION  1983     reports that she quit smoking about 8 years ago. Her smoking use included cigarettes. She has a 97.50 pack-year smoking history. She has never used smokeless tobacco. She reports that she does not drink alcohol or use drugs.  Allergies  Allergen Reactions   Gabapentin Swelling    Swelling in legs Swelling in legs Swelling in legs   Losartan Other (See Comments)    Myalgias and muscle cramping Other reaction(s): Other (See Comments) Myalgias and muscle cramping Myalgias and muscle cramping Other reaction(s): Other (See Comments) Myalgias and muscle cramping   Aricept [Donepezil Hcl]     Nausea/vomiting, low BP   Oxycodone Itching   Sulfa Antibiotics Nausea Only and Rash   Sulfonamide Derivatives Nausea Only    Family History  Problem Relation Age of Onset   Diabetes Mother    Heart disease Mother    Dementia Mother    Heart disease Father    High blood pressure Father    Colon cancer Maternal Uncle        x 2   Breast cancer Other        great aunts x 5     Prior to Admission medications   Medication Sig Start Date End Date Taking? Authorizing Provider  acetaminophen (TYLENOL) 500 MG tablet Take 1,000 mg by mouth 2 (two) times daily.   Yes [provider]  albuterol (VENTOLIN HFA) 108 (90 Base) MCG/ACT inhaler TAKE 2 PUFFS BY MOUTH EVERY 6 HOURS AS NEEDED FOR WHEEZE OR SHORTNESS OF BREATH Patient taking differently: Inhale 2 puffs into the lungs every 6 (six) hours as needed for wheezing or shortness of breath.  04/07/19  Yes Burns, Claudina Lick, MD  amitriptyline (ELAVIL) 100 MG tablet Take 100 mg by mouth at  bedtime. 07/01/19  Yes [provider]  amLODipine (NORVASC) 5 MG tablet TAKE 1 TABLET BY MOUTH TWICE A DAY Patient taking differently: Take 5 mg by mouth 2 (two) times daily.  08/06/18  Yes Binnie Rail, MD  aspirin EC 81 MG tablet Take 1 tablet (81 mg total) by mouth daily. 03/10/18  Yes Lelon Perla, MD  furosemide (LASIX) 20 MG tablet TAKE 1 TABLET BY MOUTH EVERY DAY Patient taking differently: Take 20 mg by mouth daily.  09/01/18  Yes Lelon Perla, MD  gabapentin (NEURONTIN) 300 MG capsule Take 300 mg by mouth 3 (three) times daily. 06/10/19  Yes [provider]  hydrALAZINE (APRESOLINE) 100 MG tablet Take 1 tablet (100 mg total) by mouth 3 (three) times daily. Patient taking differently: Take 100 mg by mouth 2 (  two) times daily.  03/16/19  Yes Burns, Claudina Lick, MD  insulin NPH Human (NOVOLIN N RELION) 100 UNIT/ML injection Inject 1.9 mLs (190 Units total) into the skin every morning. And syringes 1/day Patient taking differently: Inject 130 Units into the skin every morning. And syringes 1/day 10/23/18  Yes Renato Shin, MD  Insulin Syringe-Needle U-100 (INSULIN SYRINGE 1CC/30GX5/16") 30G X 5/16" 1 ML MISC Use as directed for insulin injections 5 times daily 10/21/18  Yes Renato Shin, MD  ipratropium-albuterol (DUONEB) 0.5-2.5 (3) MG/3ML SOLN Take 3 mLs by nebulization every 6 (six) hours as needed (Wheezing or dyspnea.). 02/02/17  Yes Hongalgi, Lenis Dickinson, MD  levothyroxine (SYNTHROID) 175 MCG tablet TAKE 1 TABLET BY MOUTH SIX DAYS A WEEK BEFORE BREAKFAST Patient taking differently: Take 175 mcg by mouth daily before breakfast.  04/27/19  Yes Burns, Claudina Lick, MD  Lidocaine HCl (ASPERCREME LIDOCAINE) 4 % LIQD Apply 1 application topically as needed (pain).    Yes [provider]  meloxicam (MOBIC) 7.5 MG tablet Take 15 mg by mouth daily.  06/30/15  Yes [provider]  metoprolol tartrate (LOPRESSOR) 25 MG tablet Take 1 tablet (25 mg total) by mouth 2 (two)  times daily. 12/14/18  Yes Barrett, Evelene Croon, PA-C  mirtazapine (REMERON) 15 MG tablet Take 15 mg by mouth at bedtime. Take with 30mg  tablet, total 45mg    Yes [provider]  mirtazapine (REMERON) 30 MG tablet Take 30 mg by mouth at bedtime. Take with 15mg  tablet, total 45mg  06/09/19  Yes [provider]  morphine (MS CONTIN) 30 MG 12 hr tablet Take 30 mg by mouth 2 (two) times daily. 09/12/15  Yes [provider]  morphine (MSIR) 15 MG tablet Take 15 mg by mouth daily.   Yes [provider]  ondansetron (ZOFRAN-ODT) 4 MG disintegrating tablet Take 1 tablet (4 mg total) by mouth every 6 (six) hours as needed for nausea or vomiting. 04/20/18  Yes Danis, Estill Cotta III, MD  potassium chloride SA (KLOR-CON M20) 20 MEQ tablet TAKE 2 TABLETS EVERY DAY AS NEEDED FOR CRAMPING Patient taking differently: Take 40 mEq by mouth daily as needed (cramping).  11/12/17  Yes Burns, Claudina Lick, MD  sertraline (ZOLOFT) 50 MG tablet Take 50 mg by mouth daily. 07/02/19  Yes [provider]  spironolactone (ALDACTONE) 25 MG tablet Take 1 tablet (25 mg total) by mouth daily. Patient taking differently: Take 25 mg by mouth daily. CAUSES PAIN ONLY TAKES WHEN BP IS ELEVATED 03/16/19  Yes Burns, Claudina Lick, MD  tiZANidine (ZANAFLEX) 2 MG tablet Take 2 mg by mouth as needed for muscle spasms.  05/30/15  Yes [provider]    Physical Exam: Vitals:   07/06/19 1155 07/06/19 1203 07/06/19 1830 07/06/19 1900  BP:  (!) 156/57 (!) 142/55 (!) 122/59  Pulse:  85 76 78  Resp:  16 11 17   Temp:  99.9 F (37.7 C)    TempSrc:  Oral    SpO2:  95% 96% 93%  Weight: 127.5 kg         Constitutional: Morbidly obese, no distress Vitals:   07/06/19 1155 07/06/19 1203 07/06/19 1830 07/06/19 1900  BP:  (!) 156/57 (!) 142/55 (!) 122/59  Pulse:  85 76 78  Resp:  16 11 17   Temp:  99.9 F (37.7 C)    TempSrc:  Oral    SpO2:  95% 96% 93%  Weight: 127.5 kg      Eyes: PERRL, lids and  conjunctivae normal ENMT: Mucous membranes are moist. Posterior pharynx clear of any exudate or lesions.Normal dentition.  Neck: normal, supple, no masses, no thyromegaly Respiratory: clear to auscultation bilaterally, no wheezing, no crackles. Normal respiratory effort. No accessory muscle use.  Cardiovascular: Regular rate and rhythm, no murmurs / rubs / gallops. No extremity edema. 2+ pedal pulses. No carotid bruits.  Abdomen: no tenderness, no masses palpated. No hepatosplenomegaly. Bowel sounds positive.  Musculoskeletal: no clubbing / cyanosis. No joint deformity upper and lower extremities. Good ROM, no contractures. Normal muscle tone.  Skin: no rashes, lesions, ulcers. No induration Neurologic: CN 2-12 grossly intact. Sensation intact, DTR normal. Strength 5/5 in all 4.  Psychiatric: Normal judgment and insight. Alert and oriented x 3. Normal mood.     Labs on Admission: I have personally reviewed following labs and imaging studies  CBC: Recent Labs  Lab 07/06/19 1233 07/06/19 1922  WBC 8.1 6.8  HGB 12.4 12.9  HCT 41.8 43.7  MCV 84.8 84.9  PLT 148* [id number]*   Basic Metabolic Panel: Recent Labs  Lab 07/06/19 1233  NA 137  K 3.9  CL 98  CO2 30  GLUCOSE 158*  BUN 19  CREATININE 0.94  CALCIUM 8.9   GFR: Estimated Creatinine Clearance: 87.7 mL/min (by C-G formula based on SCr of 0.94 mg/dL). Liver Function Tests: No results for input(s): AST, ALT, ALKPHOS, BILITOT, PROT, ALBUMIN in the last 168 hours. No results for input(s): LIPASE, AMYLASE in the last 168 hours. No results for input(s): AMMONIA in the last 168 hours. Coagulation Profile: No results for input(s): INR, PROTIME in the last 168 hours. Cardiac Enzymes: No results for input(s): CKTOTAL, CKMB, CKMBINDEX, TROPONINI in the last 168 hours. BNP (last 3 results) No results for input(s): PROBNP in the last 8760 hours. HbA1C: No results for input(s): HGBA1C in the last 72 hours. CBG: No results for input(s):  GLUCAP in the last 168 hours. Lipid Profile: No results for input(s): CHOL, HDL, LDLCALC, TRIG, CHOLHDL, LDLDIRECT in the last 72 hours. Thyroid Function Tests: No results for input(s): TSH, T4TOTAL, FREET4, T3FREE, THYROIDAB in the last 72 hours. Anemia Panel: No results for input(s): VITAMINB12, FOLATE, FERRITIN, TIBC, IRON, RETICCTPCT in the last 72 hours. Urine analysis:    Component Value Date/Time   COLORURINE YELLOW 06/01/2018 1540   APPEARANCEUR CLEAR 06/01/2018 1540   LABSPEC >=1.030 (A) 06/01/2018 1540   PHURINE 5.5 06/01/2018 1540   GLUCOSEU NEGATIVE 06/01/2018 1540   HGBUR NEGATIVE 06/01/2018 1540   BILIRUBINUR SMALL (A) 06/01/2018 1540   BILIRUBINUR neg 04/08/2016 1408   KETONESUR NEGATIVE 06/01/2018 1540   PROTEINUR NEGATIVE 01/31/2017 1427   UROBILINOGEN 1.0 06/01/2018 1540   NITRITE NEGATIVE 06/01/2018 1540   LEUKOCYTESUR NEGATIVE 06/01/2018 1540   Sepsis Labs: @LABRCNTIP (procalcitonin:4,lacticidven:4) )No results found for this or any previous visit (from the past 240 hour(s)).   Radiological Exams on Admission: Dg Chest 2 View  Result Date: 07/06/2019 CLINICAL DATA:  Chest pain and shortness of breath. EXAM: CHEST - 2 VIEW COMPARISON:  03/24/2018 FINDINGS: The heart size and mediastinal contours are within normal limits. Both lungs are clear. The visualized skeletal structures are unremarkable. IMPRESSION: Normal exam. Electronically Signed   By: Lorriane Shire M.D.   On: 07/06/2019 12:27    EKG: Independently reviewed.  It showed normal sinus rhythm with right bundle branch block.  No significant ST changes  Assessment/Plan Principal Problem:   Chest pain Active Problems:   Obesity   Anxiety state   Chronic pain  syndrome   Essential hypertension   Type 1 diabetes mellitus ([facility])   Liver cirrhosis secondary to NASH ([place])   Hypothyroidism   Hyperlipidemia   Obesity hypoventilation syndrome ([place])     #1 chest pain: Patient has risk factors for  coronary artery disease.  She has mildly elevated troponins but normal EKG.  Will admit for observation.  Continue cycling enzymes.  Consider echocardiogram.  Chest pain is reproducible and in a patient with chronic pain and fibromyalgia could be musculoskeletal.  Possible cardiology involvement for some stress testing.  #2 diabetes: Initiate sliding scale insulin with home regimen.  #3 essential hypertension: Resume home regimen and monitor  #4 liver cirrhosis: Appears to be compensated  #5 hyperlipidemia: Check fasting lipid panel and continue statin  #6 hypothyroidism: Continue levothyroxine  #7 morbid obesity: Dietary counseling  #8 chronic pain syndrome: Continue home regimen   DVT prophylaxis: Lovenox Code Status: Full code Family Communication: No family at bedside Disposition Plan: Home Consults called: None Admission status: Observation  Severity of Illness: The appropriate patient status for this patient is OBSERVATION. Observation status is judged to be reasonable and necessary in order to provide the required intensity of service to ensure the patient's safety. The patient's presenting symptoms, physical exam findings, and initial radiographic and laboratory data in the context of their medical condition is felt to place them at decreased risk for further clinical deterioration. Furthermore, it is anticipated that the patient will be medically stable for discharge from the hospital within 2 midnights of admission. The following factors support the patient status of observation.   " The patient's presenting symptoms include chest pain. " The physical exam findings include morbidly obese but no acute distress. " The initial radiographic and laboratory data are mildly elevated troponins.     Barbette Merino MD [facility] Pager [phone]  If 7PM-7AM, please contact night-coverage [email or web address] Password [facility]  07/06/2019, 9:24 PM

## 2019-07-06 NOTE — ED Provider Notes (Signed)
[facility] DEPARTMENT Provider Note   CSN: [id number] Arrival date & time: 07/06/19  1145     History   Chief Complaint Chief Complaint  Patient presents with  . Chest Pain  . Neck Pain  . Back Pain    HPI Mary Acosta is a 63 y.o. female.     63 year old female with prior medical history as detailed below presents for evaluation of chest discomfort.  Patient reports onset of anterior substernal chest discomfort this morning.  Symptoms began around 9 AM.  She reports multiple episodes over the course of today.  She reports the pain will last anywhere from 30 to 45 minutes at a time.  Pain is made worse by movement -such as sitting or standing up.  She denies associated shortness of breath or cough.  She denies fever.  She took aspirin at home which significantly improved her symptoms.  She denies prior history of ACS or MI.  She denies prior history of cardiac stenting.  The history is provided by the patient and medical records.  Chest Pain Pain location:  Substernal area Pain quality: aching   Pain radiates to:  Does not radiate Pain severity:  Mild Onset quality:  Gradual Duration:  9 hours Timing:  Intermittent Progression:  Waxing and waning Context: movement   Relieved by:  Nothing Worsened by:  Nothing Ineffective treatments:  None tried Associated symptoms: back pain   Neck Pain Associated symptoms: chest pain   Back Pain Associated symptoms: chest pain     Past Medical History:  Diagnosis Date  . AKI (acute kidney injury) ([place]) 01/2017  . Anxiety    with panic attacks  . Arthritis    "back; feet; hands; shoulders" (08/26/2014)  . Asthma   . Cervical cancer ([place])   . Chronic lower back pain   . Chronic narcotic use   . Chronic pain syndrome    [facility]  . Cirrhosis ([place])   . Clostridium difficile infection 2017  . COPD (chronic obstructive pulmonary disease) ([place])   . Daily headache   .  Depression   . Diabetic neuropathy ([place]) 06/04/2017  . DJD (degenerative joint disease)   . Fatty liver disease, nonalcoholic   . Fibromyalgia   . Frequency of urination   . HCAP (healthcare-associated pneumonia) 08/26/2014  . History of TIA (transient ischemic attack) 11-01-2010   NO RESIDUAL  . Hyperlipidemia   . Hypertension   . Hypothyroidism   . IDDM (insulin dependent diabetes mellitus)   . Insomnia   . Lumbar stenosis   . Memory difficulty 07/25/2016  . Nocturia   . OSA (obstructive sleep apnea)    NO CPAP SINCE WT LOSS  . Osteoarthritis    with severe disease in knee  . Pneumonia "several times"  . Polymyalgia rheumatica ([place])   . Scoliosis   . Seasonal allergies   . Thyroid cancer ([place])   . Urgency of urination   . Vaginal pain S/P SLING  FEB 2012    Patient Active Problem List   Diagnosis Date Noted  . Grief 02/03/2019  . Nausea 01/04/2019  . Reactive airway disease 10/21/2018  . Arthralgia 09/04/2018  . Narcotic overdose ([place]) 05/14/2018  . Thyroid cancer ([place]) 04/30/2018  . Obesity hypoventilation syndrome ([place]) 09/24/2017  . Palpitations 09/18/2017  . Diabetic neuropathy ([place]) 06/04/2017  . Vitamin B12 deficiency 03/05/2017  . Numbness and tingling in both hands 02/24/2017  . OSA (obstructive sleep apnea)  01/31/2017  . Liver cirrhosis secondary to NASH ([place]) 01/31/2017  . Hypothyroidism 01/31/2017  . Chronic narcotic use 01/31/2017  . Fibromyalgia 01/31/2017  . Hyperlipidemia 01/31/2017  . Gastroparesis   . Memory disorder 07/25/2016  . Lump in neck 01/29/2016  . Chronic diastolic (congestive) heart failure ([place]) 01/03/2016  . Fatty liver 01/03/2016  . Type 1 diabetes mellitus ([place]) 01/03/2016  . Recurrent Clostridium difficile diarrhea 01/03/2016  . Chronic respiratory failure ([place]) 11/10/2014  . Physical deconditioning 09/26/2014  . Dyspnea 09/26/2014  . Iron deficiency anemia, unspecified  04/05/2011  . Bariatric surgery status 04/05/2011  .  Left arm pain   . UNSPECIFIED VITAMIN D DEFICIENCY 10/22/2007  . LOW BACK PAIN, CHRONIC 10/22/2007  . INSOMNIA 10/22/2007  . Obesity 10/14/2007  . Chronic pain syndrome 10/14/2007  . CARPAL TUNNEL SYNDROME 10/14/2007  . ALLERGIC RHINITIS CAUSE UNSPECIFIED 10/14/2007  . ARTHRITIS 10/14/2007  . Anxiety state 08/25/2007  . Depression 08/25/2007  . Essential hypertension 08/25/2007  . ASTHMA 08/25/2007  . CONSTIPATION 08/25/2007  . POLYMYALGIA RHEUMATICA 08/25/2007  . LEG EDEMA, BILATERAL 08/25/2007    Past Surgical History:  Procedure Laterality Date  . APPENDECTOMY  1982  . BREAST EXCISIONAL BIOPSY Left 02/28/2005   Atypical Ductal Hyperplasia  . CARDIAC CATHETERIZATION  09-04-2004   NORMAL CORONARY ANATOMY/ NORMAL LVF/ EF 60%  . CARDIOVASCULAR STRESS TEST  12-27-2010  DR [place]   ABNORMAL NUCLEAR STUDY W/ /MILD INFERIOR ISCHEMIA/ EF 69%/  CT HEART ANGIOGRAM ;  NO ACUTE FINDINGS  . CRYOABLATION  05/16/2003   w/LEEP FOR ABNORMAL PAP SMEAR  . CYSTOSCOPY  05/18/2012   Procedure: CYSTOSCOPY;  Surgeon: Reece Packer, MD;  Location: [facility];  Service: Urology;  Laterality: N/A;  examination under anethesia  . ESOPHAGOGASTRODUODENOSCOPY (EGD) WITH PROPOFOL N/A 09/03/2016   Procedure: ESOPHAGOGASTRODUODENOSCOPY (EGD) WITH PROPOFOL;  Surgeon: Doran Stabler, MD;  Location: [facility];  Service: Gastroenterology;  Laterality: N/A;  . HYSTEROSCOPY W/D&C  08-19-2007   PMB  . KNEE ARTHROSCOPY W/ DEBRIDEMENT Left 03/29/2006   INTERNAL DERANGEMENT/ SEVERE DJD/ MENISCUS TEARS  . LAPAROSCOPIC CHOLECYSTECTOMY  06-10-2002  . LAPAROSCOPIC GASTRIC BANDING  03/01/2006   TRUNCAL VAGOTOMY/ PLACEMENT OF VG BAND  . REVISION TOTAL KNEE ARTHROPLASTY Left 08-29-2008; 05/2009  . TONSILLECTOMY  1969  . TOTAL KNEE ARTHROPLASTY Left 01-23-2007   SEVERE DJD  . TOTAL THYROIDECTOMY  11-22-2005   BILATERAL THYROID NODULES-- PAPILLARY CARCINOMA (0.5CM)/ ADENOMATOID NODULES  .  TRANSTHORACIC ECHOCARDIOGRAM  12-27-2010   LVSF NORMAL / EF [id number] GRADE I DIASTOLIC DYSFUNCTION/ MILD MITRAL REGURG. / MILDLY DILATED LEFT ATRIUM/ MILDY INCREASED SYSTOLIC PRESSURE OF PULMONARY ARTERIES  . TRANSVAGINAL SUBURETERAL TAPE/ SLING  09-28-2010   MIXED URINARY INCONTINENCE  . TUBAL LIGATION  1983     OB History   No obstetric history on file.      Home Medications    Prior to Admission medications   Medication Sig Start Date End Date Taking? Authorizing Provider  albuterol (VENTOLIN HFA) 108 (90 Base) MCG/ACT inhaler TAKE 2 PUFFS BY MOUTH EVERY 6 HOURS AS NEEDED FOR WHEEZE OR SHORTNESS OF BREATH 04/07/19   Binnie Rail, MD  amLODipine (NORVASC) 5 MG tablet TAKE 1 TABLET BY MOUTH TWICE A DAY 08/06/18   Binnie Rail, MD  aspirin EC 81 MG tablet Take 1 tablet (81 mg total) by mouth daily. 03/10/18   Lelon Perla, MD  furosemide (LASIX) 20 MG tablet TAKE 1 TABLET BY MOUTH EVERY DAY Patient  not taking: Reported on 06/23/2019 09/01/18   Lelon Perla, MD  hydrALAZINE (APRESOLINE) 100 MG tablet Take 1 tablet (100 mg total) by mouth 3 (three) times daily. Patient taking differently: Take 100 mg by mouth 2 (two) times daily.  03/16/19   Burns, Claudina Lick, MD  insulin NPH Human (NOVOLIN N RELION) 100 UNIT/ML injection Inject 1.9 mLs (190 Units total) into the skin every morning. And syringes 1/day 10/23/18   Renato Shin, MD  Insulin Syringe-Needle U-100 (INSULIN SYRINGE 1CC/30GX5/16") 30G X 5/16" 1 ML MISC Use as directed for insulin injections 5 times daily 10/21/18   Renato Shin, MD  ipratropium-albuterol (DUONEB) 0.5-2.5 (3) MG/3ML SOLN Take 3 mLs by nebulization every 6 (six) hours as needed (Wheezing or dyspnea.). 02/02/17   Hongalgi, Lenis Dickinson, MD  levothyroxine (SYNTHROID) 175 MCG tablet TAKE 1 TABLET BY MOUTH SIX DAYS A WEEK BEFORE BREAKFAST 04/27/19   Burns, Claudina Lick, MD  Lidocaine HCl (ASPERCREME LIDOCAINE) 4 % LIQD Apply 1 application topically as needed (pain).      [provider]  meloxicam (MOBIC) 7.5 MG tablet Take 15 mg by mouth daily.  06/30/15   [provider]  metoprolol tartrate (LOPRESSOR) 25 MG tablet Take 1 tablet (25 mg total) by mouth 2 (two) times daily. 12/14/18   Barrett, Evelene Croon, PA-C  mirtazapine (REMERON) 15 MG tablet Take 15 mg by mouth at bedtime.    [provider]  morphine (MS CONTIN) 30 MG 12 hr tablet Take 30 mg by mouth 2 (two) times daily. 09/12/15   [provider]  morphine (MSIR) 15 MG tablet Take 15 mg by mouth daily.    [provider]  ondansetron (ZOFRAN-ODT) 4 MG disintegrating tablet Take 1 tablet (4 mg total) by mouth every 6 (six) hours as needed for nausea or vomiting. 04/20/18   Danis, Estill Cotta III, MD  potassium chloride SA (KLOR-CON M20) 20 MEQ tablet TAKE 2 TABLETS EVERY DAY AS NEEDED FOR CRAMPING 11/12/17   Binnie Rail, MD  spironolactone (ALDACTONE) 25 MG tablet Take 1 tablet (25 mg total) by mouth daily. Patient taking differently: Take 25 mg by mouth daily. CAUSES PAIN ONLY TAKES WHEN BP IS ELEVATED 03/16/19   Burns, Claudina Lick, MD  tiZANidine (ZANAFLEX) 2 MG tablet Take 2 mg by mouth as needed for muscle spasms.  05/30/15   [provider]    Family History Family History  Problem Relation Age of Onset  . Diabetes Mother   . Heart disease Mother   . Dementia Mother   . Heart disease Father   . High blood pressure Father   . Colon cancer Maternal Uncle        x 2  . Breast cancer Other        great aunts x 5    Social History Social History   Tobacco Use  . Smoking status: Former Smoker    Packs/day: 2.50    Years: 39.00    Pack years: 97.50    Types: Cigarettes    Quit date: 10/22/2010    Years since quitting: 8.7  . Smokeless tobacco: Never Used  Substance Use Topics  . Alcohol use: No    Alcohol/week: 0.0 standard drinks  . Drug use: No     Allergies   Gabapentin, Losartan, Aricept [donepezil hcl], Oxycodone, Sulfa antibiotics, and  Sulfonamide derivatives   Review of Systems Review of Systems  Cardiovascular: Positive for chest pain.  Musculoskeletal: Positive for back pain and  neck pain.  All other systems reviewed and are negative.    Physical Exam Updated Vital Signs BP (!) 156/57 (BP Location: Right Arm)   Pulse 85   Temp 99.9 F (37.7 C) (Oral)   Resp 16   Wt 127.5 kg   SpO2 95%   BMI 41.50 kg/m   Physical Exam Vitals signs and nursing note reviewed.  Constitutional:      General: She is not in acute distress.    Appearance: She is well-developed.  HENT:     Head: Normocephalic and atraumatic.  Eyes:     Conjunctiva/sclera: Conjunctivae normal.     Pupils: Pupils are equal, round, and reactive to light.  Neck:     Musculoskeletal: Normal range of motion and neck supple.  Cardiovascular:     Rate and Rhythm: Normal rate and regular rhythm.     Heart sounds: Normal heart sounds.  Pulmonary:     Effort: Pulmonary effort is normal. No respiratory distress.     Breath sounds: Normal breath sounds.  Chest:     Chest wall: Tenderness present.     Comments: Mild diffuse tenderness to the anterior chest wall  Abdominal:     General: There is no distension.     Palpations: Abdomen is soft.     Tenderness: There is no abdominal tenderness.  Musculoskeletal: Normal range of motion.        General: No deformity.  Skin:    General: Skin is warm and dry.  Neurological:     Mental Status: She is alert and oriented to person, place, and time.      ED Treatments / Results  Labs (all labs ordered are listed, but only abnormal results are displayed) Labs Reviewed  BASIC METABOLIC PANEL - Abnormal; Notable for the following components:      Result Value   Glucose, Bld 158 (*)    All other components within normal limits  CBC - Abnormal; Notable for the following components:   MCH 25.2 (*)    MCHC 29.7 (*)    Platelets 148 (*)    All other components within normal limits  SARS CORONAVIRUS 2  (TAT 6-24 HRS)  TROPONIN I (HIGH SENSITIVITY)  TROPONIN I (HIGH SENSITIVITY)    EKG EKG Interpretation  Date/Time:  Tuesday July 06 2019 11:49:49 EST Ventricular Rate:  85 PR Interval:  196 QRS Duration: 128 QT Interval:  390 QTC Calculation: 464 R Axis:   70 Text Interpretation: Normal sinus rhythm Right bundle branch block Abnormal ECG Confirmed by Dene Gentry [phone]) on 07/06/2019 6:25:58 PM   Radiology Dg Chest 2 View  Result Date: 07/06/2019 CLINICAL DATA:  Chest pain and shortness of breath. EXAM: CHEST - 2 VIEW COMPARISON:  03/24/2018 FINDINGS: The heart size and mediastinal contours are within normal limits. Both lungs are clear. The visualized skeletal structures are unremarkable. IMPRESSION: Normal exam. Electronically Signed   By: Lorriane Shire M.D.   On: 07/06/2019 12:27    Procedures Procedures (including critical care time)  Medications Ordered in ED Medications  sodium chloride flush (NS) 0.9 % injection 3 mL (has no administration in time range)     Initial Impression / Assessment and Plan / ED Course  I have reviewed the triage vital signs and the nursing notes.  Pertinent labs & imaging results that were available during my care of the patient were reviewed by me and considered in my medical decision making (see chart for details).  MDM  Screen complete  Mary Acosta was evaluated in Emergency Department on 07/06/2019 for the symptoms described in the history of present illness. She was evaluated in the context of the global COVID-19 pandemic, which necessitated consideration that the patient might be at risk for infection with the SARS-CoV-2 virus that causes COVID-19. Institutional protocols and algorithms that pertain to the evaluation of patients at risk for COVID-19 are in a state of rapid change based on information released by regulatory bodies including the CDC and federal and state organizations. These policies and  algorithms were followed during the patient's care in the ED.  Patient is presenting for evaluation of reported chest pain.  Patient's symptoms are somewhat atypical.  She appears to be improved at time of evaluation.  Troponin x 2 is 15 without change. EKG does not demonstrate acute ischemia.   Given multiple risk factors, will plan on overnight observation.  Hospitalist Jonelle Sidle) service is aware of case and will evaluate for admission.   Final Clinical Impressions(s) / ED Diagnoses   Final diagnoses:  Chest pain, unspecified type    ED Discharge Orders    None       Valarie Merino, MD 07/06/19 1859

## 2019-07-07 ENCOUNTER — Other Ambulatory Visit: Payer: Self-pay

## 2019-07-07 ENCOUNTER — Ambulatory Visit (HOSPITAL_BASED_OUTPATIENT_CLINIC_OR_DEPARTMENT_OTHER): Payer: Medicare HMO

## 2019-07-07 DIAGNOSIS — J45909 Unspecified asthma, uncomplicated: Secondary | ICD-10-CM | POA: Diagnosis not present

## 2019-07-07 DIAGNOSIS — E109 Type 1 diabetes mellitus without complications: Secondary | ICD-10-CM | POA: Diagnosis not present

## 2019-07-07 DIAGNOSIS — R079 Chest pain, unspecified: Secondary | ICD-10-CM

## 2019-07-07 DIAGNOSIS — I10 Essential (primary) hypertension: Secondary | ICD-10-CM | POA: Diagnosis not present

## 2019-07-07 DIAGNOSIS — Z85850 Personal history of malignant neoplasm of thyroid: Secondary | ICD-10-CM | POA: Diagnosis not present

## 2019-07-07 DIAGNOSIS — G894 Chronic pain syndrome: Secondary | ICD-10-CM | POA: Diagnosis not present

## 2019-07-07 DIAGNOSIS — Z20828 Contact with and (suspected) exposure to other viral communicable diseases: Secondary | ICD-10-CM | POA: Diagnosis not present

## 2019-07-07 DIAGNOSIS — Z794 Long term (current) use of insulin: Secondary | ICD-10-CM | POA: Diagnosis not present

## 2019-07-07 DIAGNOSIS — R0789 Other chest pain: Secondary | ICD-10-CM | POA: Diagnosis not present

## 2019-07-07 DIAGNOSIS — E890 Postprocedural hypothyroidism: Secondary | ICD-10-CM | POA: Diagnosis not present

## 2019-07-07 DIAGNOSIS — J449 Chronic obstructive pulmonary disease, unspecified: Secondary | ICD-10-CM | POA: Diagnosis not present

## 2019-07-07 LAB — GLUCOSE, CAPILLARY: Glucose-Capillary: 145 mg/dL — ABNORMAL HIGH (ref 70–99)

## 2019-07-07 LAB — SARS CORONAVIRUS 2 (TAT 6-24 HRS): SARS Coronavirus 2: NEGATIVE

## 2019-07-07 LAB — ECHOCARDIOGRAM COMPLETE
Height: 69 in
Weight: 4536 oz

## 2019-07-07 MED ORDER — ASPIRIN EC 81 MG PO TBEC
81.0000 mg | DELAYED_RELEASE_TABLET | Freq: Every day | ORAL | Status: DC
  Administered 2019-07-07: 81 mg via ORAL
  Filled 2019-07-07: qty 1

## 2019-07-07 MED ORDER — MIRTAZAPINE 7.5 MG PO TABS
45.0000 mg | ORAL_TABLET | Freq: Every day | ORAL | Status: DC
  Administered 2019-07-07: 02:00:00 45 mg via ORAL
  Filled 2019-07-07: qty 6
  Filled 2019-07-07: qty 1

## 2019-07-07 MED ORDER — TIZANIDINE HCL 2 MG PO TABS: 2.0000 mg | ORAL_TABLET | Freq: Three times a day (TID) | ORAL | Status: DC | PRN

## 2019-07-07 MED ORDER — LEVOTHYROXINE SODIUM 75 MCG PO TABS
175.0000 ug | ORAL_TABLET | Freq: Every day | ORAL | Status: DC
  Administered 2019-07-07: 175 ug via ORAL
  Filled 2019-07-07: qty 1

## 2019-07-07 MED ORDER — MUSCLE RUB 10-15 % EX CREA: 1.0000 "application " | TOPICAL_CREAM | CUTANEOUS | Status: DC | PRN

## 2019-07-07 MED ORDER — MORPHINE SULFATE 15 MG PO TABS
15.0000 mg | ORAL_TABLET | Freq: Every day | ORAL | Status: DC
  Administered 2019-07-07: 15 mg via ORAL
  Filled 2019-07-07: qty 1

## 2019-07-07 MED ORDER — HYDRALAZINE HCL 50 MG PO TABS
100.0000 mg | ORAL_TABLET | Freq: Two times a day (BID) | ORAL | Status: DC
  Administered 2019-07-07 (×2): 100 mg via ORAL
  Filled 2019-07-07 (×2): qty 2

## 2019-07-07 MED ORDER — AMITRIPTYLINE HCL 25 MG PO TABS
100.0000 mg | ORAL_TABLET | Freq: Every day | ORAL | Status: DC
  Administered 2019-07-07: 100 mg via ORAL
  Filled 2019-07-07: qty 4

## 2019-07-07 MED ORDER — SERTRALINE HCL 50 MG PO TABS
50.0000 mg | ORAL_TABLET | Freq: Every day | ORAL | Status: DC
  Administered 2019-07-07: 50 mg via ORAL
  Filled 2019-07-07: qty 1

## 2019-07-07 MED ORDER — AMLODIPINE BESYLATE 5 MG PO TABS
5.0000 mg | ORAL_TABLET | Freq: Two times a day (BID) | ORAL | Status: DC
  Administered 2019-07-07 (×2): 5 mg via ORAL
  Filled 2019-07-07 (×2): qty 1

## 2019-07-07 MED ORDER — ALBUTEROL SULFATE (2.5 MG/3ML) 0.083% IN NEBU: 2.5000 mg | INHALATION_SOLUTION | Freq: Four times a day (QID) | RESPIRATORY_TRACT | Status: DC | PRN

## 2019-07-07 MED ORDER — MELOXICAM 7.5 MG PO TABS
15.0000 mg | ORAL_TABLET | Freq: Every day | ORAL | Status: DC
  Administered 2019-07-07: 15 mg via ORAL
  Filled 2019-07-07: qty 2

## 2019-07-07 MED ORDER — MIRTAZAPINE 30 MG PO TABS: 30.0000 mg | ORAL_TABLET | Freq: Every day | ORAL | Status: DC

## 2019-07-07 MED ORDER — METOPROLOL TARTRATE 25 MG PO TABS
25.0000 mg | ORAL_TABLET | Freq: Two times a day (BID) | ORAL | Status: DC
  Administered 2019-07-07 (×2): 25 mg via ORAL
  Filled 2019-07-07 (×2): qty 1

## 2019-07-07 MED ORDER — SPIRONOLACTONE 25 MG PO TABS
25.0000 mg | ORAL_TABLET | Freq: Every day | ORAL | Status: DC
  Administered 2019-07-07: 09:00:00 25 mg via ORAL
  Filled 2019-07-07: qty 1

## 2019-07-07 MED ORDER — ONDANSETRON 4 MG PO TBDP
4.0000 mg | ORAL_TABLET | Freq: Four times a day (QID) | ORAL | Status: DC | PRN
  Filled 2019-07-07: qty 1

## 2019-07-07 MED ORDER — MIRTAZAPINE 15 MG PO TABS
15.0000 mg | ORAL_TABLET | Freq: Every day | ORAL | Status: DC
  Filled 2019-07-07: qty 1

## 2019-07-07 MED ORDER — ALBUTEROL SULFATE HFA 108 (90 BASE) MCG/ACT IN AERS
2.0000 | INHALATION_SPRAY | Freq: Four times a day (QID) | RESPIRATORY_TRACT | Status: DC | PRN
  Filled 2019-07-07: qty 6.7

## 2019-07-07 MED ORDER — FUROSEMIDE 20 MG PO TABS
20.0000 mg | ORAL_TABLET | Freq: Every day | ORAL | Status: DC
  Administered 2019-07-07: 09:00:00 20 mg via ORAL
  Filled 2019-07-07: qty 1

## 2019-07-07 MED ORDER — TIZANIDINE HCL 2 MG PO TABS
2.0000 mg | ORAL_TABLET | ORAL | Status: DC | PRN
  Filled 2019-07-07: qty 1

## 2019-07-07 MED ORDER — GABAPENTIN 300 MG PO CAPS
300.0000 mg | ORAL_CAPSULE | Freq: Three times a day (TID) | ORAL | Status: DC
  Administered 2019-07-07 (×2): 300 mg via ORAL
  Filled 2019-07-07 (×3): qty 1

## 2019-07-07 MED ORDER — MORPHINE SULFATE ER 15 MG PO TBCR
30.0000 mg | EXTENDED_RELEASE_TABLET | Freq: Two times a day (BID) | ORAL | Status: DC
  Administered 2019-07-07 (×2): 30 mg via ORAL
  Filled 2019-07-07 (×2): qty 2

## 2019-07-07 MED ORDER — ACETAMINOPHEN 500 MG PO TABS
1000.0000 mg | ORAL_TABLET | Freq: Two times a day (BID) | ORAL | Status: DC
  Administered 2019-07-07: 1000 mg via ORAL
  Filled 2019-07-07: qty 2

## 2019-07-07 MED ORDER — IPRATROPIUM-ALBUTEROL 0.5-2.5 (3) MG/3ML IN SOLN: 3.0000 mL | Freq: Four times a day (QID) | RESPIRATORY_TRACT | Status: DC | PRN

## 2019-07-07 MED ORDER — POTASSIUM CHLORIDE CRYS ER 20 MEQ PO TBCR: 40.0000 meq | EXTENDED_RELEASE_TABLET | Freq: Every day | ORAL | Status: DC | PRN

## 2019-07-07 NOTE — Discharge Summary (Signed)
Physician Discharge Summary  Mary Acosta [id number] DOB: 1956/07/12 DOA: 07/06/2019  PCP: Binnie Rail, MD  Admit date: 07/06/2019 Discharge date: 07/07/2019  Time spent: 35 minutes  Recommendations for Outpatient Follow-up:  1. PCP Dr. Quay Burow in 1 week, consider increasing diuretic dose at follow-up   Discharge Diagnoses:  Principal Problem: Atypical chest pain Chronic pain syndrome Morbid obesity Fibromyalgia Liver cirrhosis/[place]   Obesity   Anxiety state   Chronic pain syndrome   Essential hypertension   Type 1 diabetes mellitus (HCC)   Liver cirrhosis secondary to NASH ([place])   Hypothyroidism   Hyperlipidemia   Obesity hypoventilation syndrome ([place])   Discharge Condition: Stable  Diet recommendation: Diabetic, low-sodium heart healthy  Filed Weights   07/06/19 1155 07/07/19 0113 07/07/19 0635  Weight: 127.5 kg 129.3 kg 128.6 kg    History of present illness:  Mary Acosta is a 63 y.o. female with medical history significant of anxiety disorder, osteoarthritis, morbid obesity, COPD, diabetes, fibromyalgia, chronic pain syndrome who presents to the ER with back, chest, abdominal pain that radiated across her chest into both her arms  Hospital Course:   Very atypical chest, back, abdominal pain -That radiated up her back into her arms -Resolved prior to admission, she was admitted overnight for observation -EKG without acute ST-T wave changes, high-sensitivity troponin x3 was negative -Her pain was felt to be musculoskeletal secondary to her chronic fibromyalgia -2D echocardiogram completed today notes normal EF and wall motion -No recurrence of symptoms, discharged home in a stable condition  Also in terms of her morbid obesity, liver cirrhosis and Karlene Lineman recommend close follow-up with PCP and consider increasing dose of diuretics namely Lasix and Aldactone at follow-up with close monitoring of labs  Discharge Exam: Vitals:   07/07/19  0635 07/07/19 1500  BP: (!) 179/88 (!) 144/62  Pulse: 83 82  Resp: 18 16  Temp: 98.2 F (36.8 C) 98.4 F (36.9 C)  SpO2: 95% 95%    General: AAOx3, no distress, morbid obese Cardiovascular: S1-S2/regular rate rhythm Respiratory: Clear  Discharge Instructions   Discharge Instructions    Diet - low sodium heart healthy   Complete by: As directed    Diet Carb Modified   Complete by: As directed    Increase activity slowly   Complete by: As directed      Allergies as of 07/07/2019      Reactions   Gabapentin Swelling   Swelling in legs Swelling in legs Swelling in legs   Losartan Other (See Comments)   Myalgias and muscle cramping Other reaction(s): Other (See Comments) Myalgias and muscle cramping Myalgias and muscle cramping Other reaction(s): Other (See Comments) Myalgias and muscle cramping   Aricept [donepezil Hcl]    Nausea/vomiting, low BP   Oxycodone Itching   Sulfa Antibiotics Nausea Only, Rash   Sulfonamide Derivatives Nausea Only      Medication List    STOP taking these medications   meloxicam 7.5 MG tablet Commonly known as: MOBIC     TAKE these medications   acetaminophen 500 MG tablet Commonly known as: TYLENOL Take 1,000 mg by mouth 2 (two) times daily.   albuterol 108 (90 Base) MCG/ACT inhaler Commonly known as: VENTOLIN HFA TAKE 2 PUFFS BY MOUTH EVERY 6 HOURS AS NEEDED FOR WHEEZE OR SHORTNESS OF BREATH What changed: See the new instructions.   amitriptyline 100 MG tablet Commonly known as: ELAVIL Take 100 mg by mouth at bedtime.   amLODipine 5 MG tablet  Commonly known as: NORVASC TAKE 1 TABLET BY MOUTH TWICE A DAY   Aspercreme Lidocaine 4 % Liqd Generic drug: Lidocaine HCl Apply 1 application topically as needed (pain).   aspirin EC 81 MG tablet Take 1 tablet (81 mg total) by mouth daily.   furosemide 20 MG tablet Commonly known as: LASIX TAKE 1 TABLET BY MOUTH EVERY DAY   gabapentin 300 MG capsule Commonly known as:  NEURONTIN Take 300 mg by mouth 3 (three) times daily.   hydrALAZINE 100 MG tablet Commonly known as: APRESOLINE Take 1 tablet (100 mg total) by mouth 3 (three) times daily. What changed: when to take this   insulin NPH Human 100 UNIT/ML injection Commonly known as: NovoLIN N ReliOn Inject 1.9 mLs (190 Units total) into the skin every morning. And syringes 1/day What changed: how much to take   INSULIN SYRINGE 1CC/30GX5/16" 30G X 5/16" 1 ML Misc Use as directed for insulin injections 5 times daily   ipratropium-albuterol 0.5-2.5 (3) MG/3ML Soln Commonly known as: DUONEB Take 3 mLs by nebulization every 6 (six) hours as needed (Wheezing or dyspnea.).   levothyroxine 175 MCG tablet Commonly known as: SYNTHROID TAKE 1 TABLET BY MOUTH SIX DAYS A WEEK BEFORE BREAKFAST What changed: See the new instructions.   metoprolol tartrate 25 MG tablet Commonly known as: LOPRESSOR Take 1 tablet (25 mg total) by mouth 2 (two) times daily.   mirtazapine 15 MG tablet Commonly known as: REMERON Take 15 mg by mouth at bedtime. Take with 30mg  tablet, total 45mg    mirtazapine 30 MG tablet Commonly known as: REMERON Take 30 mg by mouth at bedtime. Take with 15mg  tablet, total 45mg    morphine 15 MG tablet Commonly known as: MSIR Take 15 mg by mouth daily.   morphine 30 MG 12 hr tablet Commonly known as: MS CONTIN Take 30 mg by mouth 2 (two) times daily.   ondansetron 4 MG disintegrating tablet Commonly known as: ZOFRAN-ODT Take 1 tablet (4 mg total) by mouth every 6 (six) hours as needed for nausea or vomiting.   potassium chloride SA 20 MEQ tablet Commonly known as: [place]-Con M20 TAKE 2 TABLETS EVERY DAY AS NEEDED FOR CRAMPING What changed:   how much to take  how to take this  when to take this  reasons to take this  additional instructions   sertraline 50 MG tablet Commonly known as: ZOLOFT Take 50 mg by mouth daily.   spironolactone 25 MG tablet Commonly known as:  ALDACTONE Take 1 tablet (25 mg total) by mouth daily. What changed: additional instructions   tiZANidine 2 MG tablet Commonly known as: ZANAFLEX Take 2 mg by mouth as needed for muscle spasms.      Allergies  Allergen Reactions  . Gabapentin Swelling    Swelling in legs Swelling in legs Swelling in legs  . Losartan Other (See Comments)    Myalgias and muscle cramping Other reaction(s): Other (See Comments) Myalgias and muscle cramping Myalgias and muscle cramping Other reaction(s): Other (See Comments) Myalgias and muscle cramping  . Aricept [Donepezil Hcl]     Nausea/vomiting, low BP  . Oxycodone Itching  . Sulfa Antibiotics Nausea Only and Rash  . Sulfonamide Derivatives Nausea Only   Follow-up Information    Binnie Rail, MD. Schedule an appointment as soon as possible for a visit in 1 week(s).   Specialty: Internal Medicine Contact information: [place] [address] [phone]        Lelon Perla,  MD .   Specialty: Cardiology Contact information: [address] [place] [address] [phone]            The results of significant diagnostics from this hospitalization (including imaging, microbiology, ancillary and laboratory) are listed below for reference.    Significant Diagnostic Studies: Dg Chest 2 View  Result Date: 07/06/2019 CLINICAL DATA:  Chest pain and shortness of breath. EXAM: CHEST - 2 VIEW COMPARISON:  03/24/2018 FINDINGS: The heart size and mediastinal contours are within normal limits. Both lungs are clear. The visualized skeletal structures are unremarkable. IMPRESSION: Normal exam. Electronically Signed   By: Lorriane Shire M.D.   On: 07/06/2019 12:27    Microbiology: Recent Results (from the past 240 hour(s))  SARS CORONAVIRUS 2 (TAT 6-24 HRS) Nasopharyngeal Nasopharyngeal Swab     Status: None   Collection Time: 07/06/19  8:50 PM   Specimen: Nasopharyngeal Swab  Result Value Ref Range  Status   SARS Coronavirus 2 NEGATIVE NEGATIVE Final    Comment: (NOTE) SARS-CoV-2 target nucleic acids are NOT DETECTED. The SARS-CoV-2 RNA is generally detectable in upper and lower respiratory specimens during the acute phase of infection. Negative results do not preclude SARS-CoV-2 infection, do not rule out co-infections with other pathogens, and should not be used as the sole basis for treatment or other patient management decisions. Negative results must be combined with clinical observations, patient history, and epidemiological information. The expected result is Negative. Fact Sheet for Patients: SugarRoll.be Fact Sheet for Healthcare Providers: https://www.woods-mathews.com/ This test is not yet approved or cleared by the [place] FDA and  has been authorized for detection and/or diagnosis of SARS-CoV-2 by FDA under an Emergency Use Authorization (EUA). This EUA will remain  in effect (meaning this test can be used) for the duration of the COVID-19 declaration under Section 56 4(b)(1) of the Act, 21 U.S.C. section 360bbb-3(b)(1), unless the authorization is terminated or revoked sooner. Performed at [facility], [place] [address], [place], [place] [address]      Labs: Basic Metabolic Panel: Recent Labs  Lab 07/06/19 1233 07/06/19 1922  NA 137  --   K 3.9  --   CL 98  --   CO2 30  --   GLUCOSE 158*  --   BUN 19  --   CREATININE 0.94 0.89  CALCIUM 8.9  --    Liver Function Tests: No results for input(s): AST, ALT, ALKPHOS, BILITOT, PROT, ALBUMIN in the last 168 hours. No results for input(s): LIPASE, AMYLASE in the last 168 hours. No results for input(s): AMMONIA in the last 168 hours. CBC: Recent Labs  Lab 07/06/19 1233 07/06/19 1922  WBC 8.1 6.8  HGB 12.4 12.9  HCT 41.8 43.7  MCV 84.8 84.9  PLT 148* 129*   Cardiac Enzymes: No results for input(s): CKTOTAL, CKMB, CKMBINDEX, TROPONINI in the last 168  hours. BNP: BNP (last 3 results) No results for input(s): BNP in the last 8760 hours.  ProBNP (last 3 results) No results for input(s): PROBNP in the last 8760 hours.  CBG: Recent Labs  Lab 07/07/19 0126  GLUCAP 145*       Signed:  Domenic Polite MD.  [facility] 07/07/2019, 4:48 PM

## 2019-07-07 NOTE — Progress Notes (Signed)
  Echocardiogram 2D Echocardiogram has been performed.  Mary Acosta 07/07/2019, 12:04 PM

## 2019-07-07 NOTE — Plan of Care (Signed)

## 2019-07-08 ENCOUNTER — Other Ambulatory Visit: Payer: Self-pay | Admitting: Internal Medicine

## 2019-07-09 ENCOUNTER — Telehealth: Payer: Self-pay | Admitting: *Deleted

## 2019-07-09 NOTE — Telephone Encounter (Signed)
Transition Care Management Follow-up Telephone Call   Date discharged? 07/07/19   How have you been since you were released from the hospital? Pt states she is ok.. still in a little pain. She states she thinks she has a rupture disk in her back which is causing all this pain   Do you understand why you were in the hospital? YES   Do you understand the discharge instructions? YES   Where were you discharged to? Home   Items Reviewed:  Medications reviewed: YES, she states there was no changes on her medications  Allergies reviewed: YES  Dietary changes reviewed: YES, low-sodium, heart healthy and carb modified  Referrals reviewed: No referral recommeded   Functional Questionnaire:   Activities of Daily Living (ADLs):   She states she are independent in the following: bathing and hygiene, feeding, continence, grooming and toileting States they require assistance with the following: ambulation and dressing   Any transportation issues/concerns?: NO   Any patient concerns? NO   Confirmed importance and date/time of follow-up visits scheduled YES, appt 07/14/19  Provider Appointment booked with Dr. Quay Burow  Confirmed with patient if condition begins to worsen call PCP or go to the ER.  Patient was given the office number and encouraged to call back with question or concerns.  : YES

## 2019-07-12 ENCOUNTER — Other Ambulatory Visit: Payer: Self-pay | Admitting: Internal Medicine

## 2019-07-12 DIAGNOSIS — Z6841 Body Mass Index (BMI) 40.0 and over, adult: Secondary | ICD-10-CM | POA: Diagnosis not present

## 2019-07-12 DIAGNOSIS — M5030 Other cervical disc degeneration, unspecified cervical region: Secondary | ICD-10-CM | POA: Diagnosis not present

## 2019-07-12 DIAGNOSIS — M25512 Pain in left shoulder: Secondary | ICD-10-CM | POA: Diagnosis not present

## 2019-07-12 DIAGNOSIS — M546 Pain in thoracic spine: Secondary | ICD-10-CM | POA: Diagnosis not present

## 2019-07-12 DIAGNOSIS — M5412 Radiculopathy, cervical region: Secondary | ICD-10-CM | POA: Diagnosis not present

## 2019-07-13 DIAGNOSIS — J449 Chronic obstructive pulmonary disease, unspecified: Secondary | ICD-10-CM | POA: Diagnosis not present

## 2019-07-13 NOTE — Progress Notes (Deleted)
Subjective:    Patient ID: Mary Acosta, female    DOB: 1956/04/21, 63 y.o.   MRN: [id number]  HPI The patient is here for follow up from the hospital.   Admitted 07/06/2019 - 07/07/2019   Recommendations for Outpatient Follow-up:  1. PCP Dr. Quay Burow in 1 week, consider increasing diuretic dose at follow-up   She went to the ED with back , chest and abdominal pain.  The chest pain radiated across her chest into both arms.     Very atypical chest, back and abdominal pain: Radiated up her back into her arms Resolved prior to admission, admitted overnight for observation EKG w/o acute ST-T wave changes, high sensitivity troponin x 3 neg Her pain was felt to be MSK secondary to her chronic fibromyalgia 2D Echo completed today notes normal EF and wall motion No recurrence of symptoms, discharged home in a stable condition    Medications and allergies reviewed with patient and updated if appropriate.  Patient Active Problem List   Diagnosis Date Noted  . Chest pain 07/06/2019  . Grief 02/03/2019  . Nausea 01/04/2019  . Reactive airway disease 10/21/2018  . Arthralgia 09/04/2018  . Narcotic overdose ([place]) 05/14/2018  . Thyroid cancer ([place]) 04/30/2018  . Obesity hypoventilation syndrome ([place]) 09/24/2017  . Palpitations 09/18/2017  . Diabetic neuropathy ([place]) 06/04/2017  . Vitamin B12 deficiency 03/05/2017  . Numbness and tingling in both hands 02/24/2017  . OSA (obstructive sleep apnea) 01/31/2017  . Liver cirrhosis secondary to NASH ([place]) 01/31/2017  . Hypothyroidism 01/31/2017  . Chronic narcotic use 01/31/2017  . Fibromyalgia 01/31/2017  . Hyperlipidemia 01/31/2017  . Gastroparesis   . Memory disorder 07/25/2016  . Lump in neck 01/29/2016  . Chronic diastolic (congestive) heart failure ([place]) 01/03/2016  . Fatty liver 01/03/2016  . Type 1 diabetes mellitus ([place]) 01/03/2016  . Recurrent Clostridium difficile diarrhea 01/03/2016  . Chronic respiratory  failure ([place]) 11/10/2014  . Physical deconditioning 09/26/2014  . Dyspnea 09/26/2014  . Iron deficiency anemia, unspecified  04/05/2011  . Bariatric surgery status 04/05/2011  . Left arm pain   . UNSPECIFIED VITAMIN D DEFICIENCY 10/22/2007  . LOW BACK PAIN, CHRONIC 10/22/2007  . INSOMNIA 10/22/2007  . Obesity 10/14/2007  . Chronic pain syndrome 10/14/2007  . CARPAL TUNNEL SYNDROME 10/14/2007  . ALLERGIC RHINITIS CAUSE UNSPECIFIED 10/14/2007  . ARTHRITIS 10/14/2007  . Anxiety state 08/25/2007  . Depression 08/25/2007  . Essential hypertension 08/25/2007  . ASTHMA 08/25/2007  . CONSTIPATION 08/25/2007  . POLYMYALGIA RHEUMATICA 08/25/2007  . LEG EDEMA, BILATERAL 08/25/2007    Current Outpatient Medications on File Prior to Visit  Medication Sig Dispense Refill  . acetaminophen (TYLENOL) 500 MG tablet Take 1,000 mg by mouth 2 (two) times daily.    Marland Kitchen albuterol (VENTOLIN HFA) 108 (90 Base) MCG/ACT inhaler TAKE 2 PUFFS BY MOUTH EVERY 6 HOURS AS NEEDED FOR WHEEZE OR SHORTNESS OF BREATH (Patient taking differently: Inhale 2 puffs into the lungs every 6 (six) hours as needed for wheezing or shortness of breath. ) 18 g 11  . amitriptyline (ELAVIL) 100 MG tablet Take 100 mg by mouth at bedtime.    Marland Kitchen amLODipine (NORVASC) 5 MG tablet TAKE 1 TABLET BY MOUTH TWICE A DAY (Patient taking differently: Take 5 mg by mouth 2 (two) times daily. ) 180 tablet 1  . aspirin EC 81 MG tablet Take 1 tablet (81 mg total) by mouth daily. 90 tablet 3  . furosemide (LASIX) 20 MG tablet TAKE 1  TABLET BY MOUTH EVERY DAY (Patient taking differently: Take 20 mg by mouth daily. ) 90 tablet 2  . gabapentin (NEURONTIN) 300 MG capsule Take 300 mg by mouth 3 (three) times daily.    . hydrALAZINE (APRESOLINE) 100 MG tablet Take 1 tablet (100 mg total) by mouth 3 (three) times daily. (Patient taking differently: Take 100 mg by mouth 2 (two) times daily. ) 90 tablet 5  . insulin NPH Human (NOVOLIN N RELION) 100 UNIT/ML injection  Inject 1.9 mLs (190 Units total) into the skin every morning. And syringes 1/day (Patient taking differently: Inject 130 Units into the skin every morning. And syringes 1/day) 60 mL 11  . Insulin Syringe-Needle U-100 (INSULIN SYRINGE 1CC/30GX5/16") 30G X 5/16" 1 ML MISC Use as directed for insulin injections 5 times daily 150 each 1  . ipratropium-albuterol (DUONEB) 0.5-2.5 (3) MG/3ML SOLN Take 3 mLs by nebulization every 6 (six) hours as needed (Wheezing or dyspnea.).    Marland Kitchen levothyroxine (SYNTHROID) 175 MCG tablet TAKE 1 TABLET BY MOUTH SIX DAYS A WEEK BEFORE BREAKFAST (Patient taking differently: Take 175 mcg by mouth daily before breakfast. ) 90 tablet 1  . Lidocaine HCl (ASPERCREME LIDOCAINE) 4 % LIQD Apply 1 application topically as needed (pain).     . metoprolol tartrate (LOPRESSOR) 25 MG tablet Take 1 tablet (25 mg total) by mouth 2 (two) times daily. 180 tablet 3  . mirtazapine (REMERON) 15 MG tablet Take 15 mg by mouth at bedtime. Take with 30mg  tablet, total 45mg     . mirtazapine (REMERON) 30 MG tablet Take 30 mg by mouth at bedtime. Take with 15mg  tablet, total 45mg     . morphine (MS CONTIN) 30 MG 12 hr tablet Take 30 mg by mouth 2 (two) times daily.    Marland Kitchen morphine (MSIR) 15 MG tablet Take 15 mg by mouth daily.    . ondansetron (ZOFRAN-ODT) 4 MG disintegrating tablet Take 1 tablet (4 mg total) by mouth every 6 (six) hours as needed for nausea or vomiting. 30 tablet 3  . potassium chloride SA (KLOR-CON M20) 20 MEQ tablet TAKE 2 TABLETS EVERY DAY AS NEEDED FOR CRAMPING (Patient taking differently: Take 40 mEq by mouth daily as needed (cramping). ) 180 tablet 1  . promethazine (PHENERGAN) 25 MG tablet TAKE 1 TABLET (25 MG TOTAL) BY MOUTH EVERY 8 (EIGHT) HOURS AS NEEDED FOR FOR NAUSEA OR VOMITING 10 tablet 0  . sertraline (ZOLOFT) 50 MG tablet Take 50 mg by mouth daily.    Marland Kitchen spironolactone (ALDACTONE) 25 MG tablet Take 1 tablet (25 mg total) by mouth daily. CAUSES PAIN ONLY TAKES WHEN BP IS  ELEVATED 90 tablet 0  . tiZANidine (ZANAFLEX) 2 MG tablet Take 2 mg by mouth as needed for muscle spasms.   2   No current facility-administered medications on file prior to visit.     Past Medical History:  Diagnosis Date  . AKI (acute kidney injury) ([place]) 01/2017  . Anxiety    with panic attacks  . Arthritis    "back; feet; hands; shoulders" (08/26/2014)  . Asthma   . Cervical cancer ([place])   . Chronic lower back pain   . Chronic narcotic use   . Chronic pain syndrome    [facility]  . Cirrhosis ([place])   . Clostridium difficile infection 2017  . COPD (chronic obstructive pulmonary disease) ([place])   . Daily headache   . Depression   . Diabetic neuropathy ([place]) 06/04/2017  . DJD (degenerative joint  disease)   . Fatty liver disease, nonalcoholic   . Fibromyalgia   . Frequency of urination   . HCAP (healthcare-associated pneumonia) 08/26/2014  . History of TIA (transient ischemic attack) 11-01-2010   NO RESIDUAL  . Hyperlipidemia   . Hypertension   . Hypothyroidism   . IDDM (insulin dependent diabetes mellitus)   . Insomnia   . Lumbar stenosis   . Memory difficulty 07/25/2016  . Nocturia   . OSA (obstructive sleep apnea)    NO CPAP SINCE WT LOSS  . Osteoarthritis    with severe disease in knee  . Pneumonia "several times"  . Polymyalgia rheumatica ([place])   . Scoliosis   . Seasonal allergies   . Thyroid cancer ([place])   . Urgency of urination   . Vaginal pain S/P SLING  FEB 2012    Past Surgical History:  Procedure Laterality Date  . APPENDECTOMY  1982  . BREAST EXCISIONAL BIOPSY Left 02/28/2005   Atypical Ductal Hyperplasia  . CARDIAC CATHETERIZATION  09-04-2004   NORMAL CORONARY ANATOMY/ NORMAL LVF/ EF 60%  . CARDIOVASCULAR STRESS TEST  12-27-2010  DR [place]   ABNORMAL NUCLEAR STUDY W/ /MILD INFERIOR ISCHEMIA/ EF 69%/  CT HEART ANGIOGRAM ;  NO ACUTE FINDINGS  . CRYOABLATION  05/16/2003   w/LEEP FOR ABNORMAL PAP SMEAR  . CYSTOSCOPY  05/18/2012    Procedure: CYSTOSCOPY;  Surgeon: Reece Packer, MD;  [facility];  Service: Urology;  Laterality: N/A;  examination under anethesia  . ESOPHAGOGASTRODUODENOSCOPY (EGD) WITH PROPOFOL N/A 09/03/2016   Procedure: ESOPHAGOGASTRODUODENOSCOPY (EGD) WITH PROPOFOL;  Surgeon: Doran Stabler, MD;  Location: [facility];  Service: Gastroenterology;  Laterality: N/A;  . HYSTEROSCOPY W/D&C  08-19-2007   PMB  . KNEE ARTHROSCOPY W/ DEBRIDEMENT Left 03/29/2006   INTERNAL DERANGEMENT/ SEVERE DJD/ MENISCUS TEARS  . LAPAROSCOPIC CHOLECYSTECTOMY  06-10-2002  . LAPAROSCOPIC GASTRIC BANDING  03/01/2006   TRUNCAL VAGOTOMY/ PLACEMENT OF VG BAND  . REVISION TOTAL KNEE ARTHROPLASTY Left 08-29-2008; 05/2009  . TONSILLECTOMY  1969  . TOTAL KNEE ARTHROPLASTY Left 01-23-2007   SEVERE DJD  . TOTAL THYROIDECTOMY  11-22-2005   BILATERAL THYROID NODULES-- PAPILLARY CARCINOMA (0.5CM)/ ADENOMATOID NODULES  . TRANSTHORACIC ECHOCARDIOGRAM  12-27-2010   LVSF NORMAL / EF [id number] GRADE I DIASTOLIC DYSFUNCTION/ MILD MITRAL REGURG. / MILDLY DILATED LEFT ATRIUM/ MILDY INCREASED SYSTOLIC PRESSURE OF PULMONARY ARTERIES  . TRANSVAGINAL SUBURETERAL TAPE/ SLING  09-28-2010   MIXED URINARY INCONTINENCE  . TUBAL LIGATION  1983    Social History   Socioeconomic History  . Marital status: Married    Spouse name: Not on file  . Number of children: 2  . Years of education: 80  . Highest education level: Not on file  Occupational History  . Occupation: disabled    Fish farm manager: UNEMPLOYED  Social Needs  . Financial resource strain: Not on file  . Food insecurity    Worry: Not on file    Inability: Not on file  . Transportation needs    Medical: Not on file    Non-medical: Not on file  Tobacco Use  . Smoking status: Former Smoker    Packs/day: 2.50    Years: 39.00    Pack years: 97.50    Types: Cigarettes    Quit date: 10/22/2010    Years since quitting: 8.7  . Smokeless tobacco: Never Used   Substance and Sexual Activity  . Alcohol use: No    Alcohol/week: 0.0 standard drinks  . Drug use:  No  . Sexual activity: Not Currently  Lifestyle  . Physical activity    Days per week: Not on file    Minutes per session: Not on file  . Stress: Not on file  Relationships  . Social Herbalist on phone: Not on file    Gets together: Not on file    Attends religious service: Not on file    Active member of club or organization: Not on file    Attends meetings of clubs or organizations: Not on file    Relationship status: Not on file  Other Topics Concern  . Not on file  Social History Narrative   Lives at home w/ her husband and grandson   Right-handed   Caffeine: 1 cup of coffee per week + [employer]    Family History  Problem Relation Age of Onset  . Diabetes Mother   . Heart disease Mother   . Dementia Mother   . Heart disease Father   . High blood pressure Father   . Colon cancer Maternal Uncle        x 2  . Breast cancer Other        great aunts x 5    Review of Systems     Objective:  There were no vitals filed for this visit. BP Readings from Last 3 Encounters:  07/07/19 (!) 144/62  06/23/19 (!) 162/98  05/17/19 134/70   Wt Readings from Last 3 Encounters:  07/07/19 283 lb 8 oz (128.6 kg)  06/23/19 288 lb 6 oz (130.8 kg)  05/17/19 283 lb (128.4 kg)   There is no height or weight on file to calculate BMI.   Physical Exam    Constitutional: Appears well-developed and well-nourished. No distress.  HENT:  Head: Normocephalic and atraumatic.  Neck: Neck supple. No tracheal deviation present. No thyromegaly present.  No cervical lymphadenopathy Cardiovascular: Normal rate, regular rhythm and normal heart sounds.   No murmur heard. No carotid bruit .  No edema Pulmonary/Chest: Effort normal and breath sounds normal. No respiratory distress. No has no wheezes. No rales.  Skin: Skin is warm and dry. Not diaphoretic.  Psychiatric: Normal mood and  affect. Behavior is normal.   Lab Results  Component Value Date   WBC 6.8 07/06/2019   HGB 12.9 07/06/2019   HCT 43.7 07/06/2019   PLT 129 (L) 07/06/2019   GLUCOSE 158 (H) 07/06/2019   CHOL 175 07/06/2019   TRIG 74 07/06/2019   HDL 38 (L) 07/06/2019   LDLCALC 122 (H) 07/06/2019   ALT 21 04/01/2019   AST 29 04/01/2019   NA 137 07/06/2019   K 3.9 07/06/2019   CL 98 07/06/2019   CREATININE 0.89 07/06/2019   BUN 19 07/06/2019   CO2 30 07/06/2019   TSH 1.32 04/01/2019   INR 1.2 (H) 05/28/2019   HGBA1C 8.8 (H) 04/01/2019   MICROALBUR <0.7 09/19/2017    ECHOCARDIOGRAM COMPLETE   ECHOCARDIOGRAM REPORT       Patient Name:   Mary Acosta Date of Exam: 07/07/2019 Medical Rec #:  [id number]             Height:       69.0 in Accession #:    [id number]            Weight:       283.5 lb Date of Birth:  02/10/1956            BSA:  2.40 m Patient Age:    54 years              BP:           179/88 mmHg Patient Gender: F                     HR:           72 bpm. Exam Location:  Inpatient  Procedure: 2D Echo  Indications:    chest pain 786.50   History:        Patient has prior history of Echocardiogram examinations, most                 recent 04/10/2018. COPD Risk Factors:Hypertension, Dyslipidemia                 and Diabetes. Risk Factors:Hypertension, Dyslipidemia and                 Diabetes.   Sonographer:    Johny Chess Referring Phys: [phone] [place]    Sonographer Comments: Patient is morbidly obese. Image acquisition challenging due to respiratory motion. IMPRESSIONS   1. Left ventricular ejection fraction, by visual estimation, is 60 to 65%. The left ventricle has normal function. There is no left ventricular hypertrophy.  2. Left ventricular diastolic parameters are consistent with Grade I diastolic dysfunction (impaired relaxation).  3. Global right ventricle has normal systolic function.The right ventricular size is normal. No increase in  right ventricular wall thickness.  4. Left atrial size was normal.  5. Right atrial size was normal.  6. The mitral valve is normal in structure. Trace mitral valve regurgitation. No evidence of mitral stenosis.  7. The tricuspid valve is normal in structure. Tricuspid valve regurgitation is not demonstrated.  8. The aortic valve was not well visualized. Aortic valve regurgitation is not visualized. No evidence of aortic valve sclerosis or stenosis.  9. The pulmonic valve was normal in structure. Pulmonic valve regurgitation is not visualized. 10. The inferior vena cava is normal in size with <50% respiratory variability, suggesting right atrial pressure of 8 mmHg.  FINDINGS  Left Ventricle: Left ventricular ejection fraction, by visual estimation, is 60 to 65%. The left ventricle has normal function. There is no left ventricular hypertrophy. Left ventricular diastolic parameters are consistent with Grade I diastolic  dysfunction (impaired relaxation). Indeterminate filling pressures.  Right Ventricle: The right ventricular size is normal. No increase in right ventricular wall thickness. Global RV systolic function is has normal systolic function.  Left Atrium: Left atrial size was normal in size.  Right Atrium: Right atrial size was normal in size  Pericardium: There is no evidence of pericardial effusion.  Mitral Valve: The mitral valve is normal in structure. No evidence of mitral valve stenosis by observation. Trace mitral valve regurgitation.  Tricuspid Valve: The tricuspid valve is normal in structure. Tricuspid valve regurgitation is not demonstrated.  Aortic Valve: The aortic valve was not well visualized. Aortic valve regurgitation is not visualized. The aortic valve is structurally normal, with no evidence of sclerosis or stenosis.  Pulmonic Valve: The pulmonic valve was normal in structure. Pulmonic valve regurgitation is not visualized.  Aorta: The aortic root, ascending  aorta and aortic arch are all structurally normal, with no evidence of dilitation or obstruction.  Venous: The inferior vena cava is normal in size with less than 50% respiratory variability, suggesting right atrial pressure of 8 mmHg.  IAS/Shunts: No atrial level shunt detected by color  flow Doppler. No ventricular septal defect is seen or detected. There is no evidence of an atrial septal defect.      Diastology LV e' lateral:   8.05 cm/s LV E/e' lateral: 10.5 LV e' medial:    6.20 cm/s LV E/e' medial:  13.7    RIGHT VENTRICLE RV S prime:     10.10 cm/s TAPSE (M-mode): 2.4 cm  LEFT ATRIUM             Index       RIGHT ATRIUM           Index LA Vol (A2C):   71.5 ml 29.84 ml/m RA Area:     10.70 cm LA Vol (A4C):   62.6 ml 26.12 ml/m RA Volume:   23.20 ml  9.68 ml/m LA Biplane Vol: 67.1 ml 28.00 ml/m  AORTIC VALVE LVOT Vmax:   148.00 cm/s LVOT Vmean:  98.000 cm/s LVOT VTI:    0.329 m  MITRAL VALVE MV Area (PHT): 2.34 cm              SHUNTS MV PHT:        93.96 msec            Systemic VTI: 0.33 m MV Decel Time: 324 msec MV E velocity: 84.90 cm/s  103 cm/s MV A velocity: 105.00 cm/s 70.3 cm/s MV E/A ratio:  0.81        1.5    Skeet Latch MD Electronically signed by Skeet Latch MD Signature Date/Time: 07/07/2019/1:55:21 PM      Final      Assessment & Plan:    See Problem List for Assessment and Plan of chronic medical problems.

## 2019-07-14 ENCOUNTER — Inpatient Hospital Stay: Payer: Medicare HMO | Admitting: Internal Medicine

## 2019-07-16 DIAGNOSIS — G894 Chronic pain syndrome: Secondary | ICD-10-CM | POA: Diagnosis not present

## 2019-07-16 DIAGNOSIS — Z79891 Long term (current) use of opiate analgesic: Secondary | ICD-10-CM | POA: Diagnosis not present

## 2019-07-19 DIAGNOSIS — J449 Chronic obstructive pulmonary disease, unspecified: Secondary | ICD-10-CM | POA: Diagnosis not present

## 2019-07-19 NOTE — Progress Notes (Signed)
Subjective:    Patient ID: Mary Acosta, female    DOB: 11-22-1955, 63 y.o.   MRN: [id number]  HPI The patient is here for follow up from the hospital.   Admitted 07/06/2019 - 07/07/2019   Recommendations for Outpatient Follow-up:  1. PCP Dr. Quay Burow in 1 week, consider increasing diuretic dose at follow-up   She went to the ED with back , chest and abdominal pain.  The chest pain radiated across her chest into both arms.  She was laying in bed and she had sudden onset of pain in her neck, chest and radiated into her arms.  She was able to get up and showered and it went away.  She had it later when she was driving and it was hard to take a deep breath.  The pain was severe.     Very atypical chest, back and abdominal pain: Radiated up her back into her arms Resolved prior to admission, admitted overnight for observation EKG w/o acute ST-T wave changes, high sensitivity troponin x 3 neg Her pain was felt to be MSK secondary to her chronic fibromyalgia 2D Echo completed today notes normal EF and wall motion No recurrence of symptoms, discharged home in a stable condition  She has not had this pain again since leaving the hospital.  She sometimes has pain down her left arm, but that is not new.    She saw orthopedics last week and she wonders if the pain was coming from her neck.  She had x-rays and he wanted her to try PT.  He advised they could try an injection if needed.    She still has a lot of depression and anxiety.  She is following with psychiatry.    She is eating a little better than she has been.  She is taking her insulin as prescribed.    She takes lasix every other day - she can not take it daily.    Hypothyroidism:  She is taking her medication daily.  She denies any recent changes in energy or weight that are unexplained.   Diabetes: She is taking her insulin daily as prescribed.  She is no longer following with endocrine.  She would like to go back to  Lantus insulin-she thinks she can get help for coverage for the medication and would like a prescription sent to her pharmacy.  She thinks that worked better than the [facility].  Medications and allergies reviewed with patient and updated if appropriate.  Patient Active Problem List   Diagnosis Date Noted  . Chest pain 07/06/2019  . Grief 02/03/2019  . Nausea 01/04/2019  . Reactive airway disease 10/21/2018  . Arthralgia 09/04/2018  . Narcotic overdose ([place]) 05/14/2018  . Thyroid cancer ([place]) 04/30/2018  . Obesity hypoventilation syndrome ([place]) 09/24/2017  . Palpitations 09/18/2017  . Diabetic neuropathy ([place]) 06/04/2017  . Vitamin B12 deficiency 03/05/2017  . Numbness and tingling in both hands 02/24/2017  . OSA (obstructive sleep apnea) 01/31/2017  . Liver cirrhosis secondary to NASH ([place]) 01/31/2017  . Hypothyroidism 01/31/2017  . Chronic narcotic use 01/31/2017  . Fibromyalgia 01/31/2017  . Hyperlipidemia 01/31/2017  . Gastroparesis   . Memory disorder 07/25/2016  . Lump in neck 01/29/2016  . Chronic diastolic (congestive) heart failure ([place]) 01/03/2016  . Fatty liver 01/03/2016  . Type 1 diabetes mellitus ([place]) 01/03/2016  . Recurrent Clostridium difficile diarrhea 01/03/2016  . Chronic respiratory failure ([place]) 11/10/2014  . Physical deconditioning 09/26/2014  . Dyspnea 09/26/2014  .  Iron deficiency anemia, unspecified  04/05/2011  . Bariatric surgery status 04/05/2011  . Left arm pain   . UNSPECIFIED VITAMIN D DEFICIENCY 10/22/2007  . LOW BACK PAIN, CHRONIC 10/22/2007  . INSOMNIA 10/22/2007  . Obesity 10/14/2007  . Chronic pain syndrome 10/14/2007  . CARPAL TUNNEL SYNDROME 10/14/2007  . ALLERGIC RHINITIS CAUSE UNSPECIFIED 10/14/2007  . ARTHRITIS 10/14/2007  . Anxiety state 08/25/2007  . Depression 08/25/2007  . Essential hypertension 08/25/2007  . ASTHMA 08/25/2007  . CONSTIPATION 08/25/2007  . POLYMYALGIA RHEUMATICA 08/25/2007  . LEG EDEMA, BILATERAL 08/25/2007     Current Outpatient Medications on File Prior to Visit  Medication Sig Dispense Refill  . acetaminophen (TYLENOL) 500 MG tablet Take 1,000 mg by mouth 2 (two) times daily.    Marland Kitchen albuterol (VENTOLIN HFA) 108 (90 Base) MCG/ACT inhaler TAKE 2 PUFFS BY MOUTH EVERY 6 HOURS AS NEEDED FOR WHEEZE OR SHORTNESS OF BREATH (Patient taking differently: Inhale 2 puffs into the lungs every 6 (six) hours as needed for wheezing or shortness of breath. ) 18 g 11  . amitriptyline (ELAVIL) 100 MG tablet Take 100 mg by mouth at bedtime.    Marland Kitchen amLODipine (NORVASC) 5 MG tablet TAKE 1 TABLET BY MOUTH TWICE A DAY (Patient taking differently: Take 5 mg by mouth 2 (two) times daily. ) 180 tablet 1  . aspirin EC 81 MG tablet Take 1 tablet (81 mg total) by mouth daily. 90 tablet 3  . furosemide (LASIX) 20 MG tablet TAKE 1 TABLET BY MOUTH EVERY DAY (Patient taking differently: Take 20 mg by mouth daily. ) 90 tablet 2  . gabapentin (NEURONTIN) 300 MG capsule Take 300 mg by mouth 3 (three) times daily.    . hydrALAZINE (APRESOLINE) 100 MG tablet Take 1 tablet (100 mg total) by mouth 3 (three) times daily. (Patient taking differently: Take 100 mg by mouth 2 (two) times daily. ) 90 tablet 5  . insulin NPH Human (NOVOLIN N RELION) 100 UNIT/ML injection Inject 1.9 mLs (190 Units total) into the skin every morning. And syringes 1/day (Patient taking differently: Inject 130 Units into the skin every morning. And syringes 1/day) 60 mL 11  . Insulin Syringe-Needle U-100 (INSULIN SYRINGE 1CC/30GX5/16") 30G X 5/16" 1 ML MISC Use as directed for insulin injections 5 times daily 150 each 1  . ipratropium-albuterol (DUONEB) 0.5-2.5 (3) MG/3ML SOLN Take 3 mLs by nebulization every 6 (six) hours as needed (Wheezing or dyspnea.).    Marland Kitchen levothyroxine (SYNTHROID) 175 MCG tablet TAKE 1 TABLET BY MOUTH SIX DAYS A WEEK BEFORE BREAKFAST (Patient taking differently: Take 175 mcg by mouth daily before breakfast. ) 90 tablet 1  . Lidocaine HCl (ASPERCREME  LIDOCAINE) 4 % LIQD Apply 1 application topically as needed (pain).     . metoprolol tartrate (LOPRESSOR) 25 MG tablet Take 1 tablet (25 mg total) by mouth 2 (two) times daily. 180 tablet 3  . mirtazapine (REMERON) 15 MG tablet Take 15 mg by mouth at bedtime. Take with 30mg  tablet, total 45mg     . mirtazapine (REMERON) 30 MG tablet Take 30 mg by mouth at bedtime. Take with 15mg  tablet, total 45mg     . morphine (MS CONTIN) 30 MG 12 hr tablet Take 30 mg by mouth 2 (two) times daily.    Marland Kitchen morphine (MSIR) 15 MG tablet Take 15 mg by mouth daily.    . ondansetron (ZOFRAN-ODT) 4 MG disintegrating tablet Take 1 tablet (4 mg total) by mouth every 6 (six) hours as needed for nausea  or vomiting. 30 tablet 3  . potassium chloride SA (KLOR-CON M20) 20 MEQ tablet TAKE 2 TABLETS EVERY DAY AS NEEDED FOR CRAMPING (Patient taking differently: Take 40 mEq by mouth daily as needed (cramping). ) 180 tablet 1  . promethazine (PHENERGAN) 25 MG tablet TAKE 1 TABLET (25 MG TOTAL) BY MOUTH EVERY 8 (EIGHT) HOURS AS NEEDED FOR FOR NAUSEA OR VOMITING 10 tablet 0  . sertraline (ZOLOFT) 50 MG tablet Take 50 mg by mouth daily.    Marland Kitchen spironolactone (ALDACTONE) 25 MG tablet Take 1 tablet (25 mg total) by mouth daily. CAUSES PAIN ONLY TAKES WHEN BP IS ELEVATED 90 tablet 0  . tiZANidine (ZANAFLEX) 2 MG tablet Take 2 mg by mouth as needed for muscle spasms.   2   No current facility-administered medications on file prior to visit.     Past Medical History:  Diagnosis Date  . AKI (acute kidney injury) ([place]) 01/2017  . Anxiety    with panic attacks  . Arthritis    "back; feet; hands; shoulders" (08/26/2014)  . Asthma   . Cervical cancer ([place])   . Chronic lower back pain   . Chronic narcotic use   . Chronic pain syndrome    [facility]  . Cirrhosis ([place])   . Clostridium difficile infection 2017  . COPD (chronic obstructive pulmonary disease) ([place])   . Daily headache   . Depression   . Diabetic neuropathy ([place])  06/04/2017  . DJD (degenerative joint disease)   . Fatty liver disease, nonalcoholic   . Fibromyalgia   . Frequency of urination   . HCAP (healthcare-associated pneumonia) 08/26/2014  . History of TIA (transient ischemic attack) 11-01-2010   NO RESIDUAL  . Hyperlipidemia   . Hypertension   . Hypothyroidism   . IDDM (insulin dependent diabetes mellitus)   . Insomnia   . Lumbar stenosis   . Memory difficulty 07/25/2016  . Nocturia   . OSA (obstructive sleep apnea)    NO CPAP SINCE WT LOSS  . Osteoarthritis    with severe disease in knee  . Pneumonia "several times"  . Polymyalgia rheumatica ([place])   . Scoliosis   . Seasonal allergies   . Thyroid cancer ([place])   . Urgency of urination   . Vaginal pain S/P SLING  FEB 2012    Past Surgical History:  Procedure Laterality Date  . APPENDECTOMY  1982  . BREAST EXCISIONAL BIOPSY Left 02/28/2005   Atypical Ductal Hyperplasia  . CARDIAC CATHETERIZATION  09-04-2004   NORMAL CORONARY ANATOMY/ NORMAL LVF/ EF 60%  . CARDIOVASCULAR STRESS TEST  12-27-2010  DR [place]   ABNORMAL NUCLEAR STUDY W/ /MILD INFERIOR ISCHEMIA/ EF 69%/  CT HEART ANGIOGRAM ;  NO ACUTE FINDINGS  . CRYOABLATION  05/16/2003   w/LEEP FOR ABNORMAL PAP SMEAR  . CYSTOSCOPY  05/18/2012   Procedure: CYSTOSCOPY;  Surgeon: Reece Packer, MD;  Location[facility];  Service: Urology;  Laterality: N/A;  examination under anethesia  . ESOPHAGOGASTRODUODENOSCOPY (EGD) WITH PROPOFOL N/A 09/03/2016   Procedure: ESOPHAGOGASTRODUODENOSCOPY (EGD) WITH PROPOFOL;  Surgeon: Doran Stabler, MD;  Location: [facility];  Service: Gastroenterology;  Laterality: N/A;  . HYSTEROSCOPY W/D&C  08-19-2007   PMB  . KNEE ARTHROSCOPY W/ DEBRIDEMENT Left 03/29/2006   INTERNAL DERANGEMENT/ SEVERE DJD/ MENISCUS TEARS  . LAPAROSCOPIC CHOLECYSTECTOMY  06-10-2002  . LAPAROSCOPIC GASTRIC BANDING  03/01/2006   TRUNCAL VAGOTOMY/ PLACEMENT OF VG BAND  . REVISION TOTAL KNEE ARTHROPLASTY Left  08-29-2008; 05/2009  . TONSILLECTOMY  1969  . TOTAL KNEE ARTHROPLASTY Left 01-23-2007   SEVERE DJD  . TOTAL THYROIDECTOMY  11-22-2005   BILATERAL THYROID NODULES-- PAPILLARY CARCINOMA (0.5CM)/ ADENOMATOID NODULES  . TRANSTHORACIC ECHOCARDIOGRAM  12-27-2010   LVSF NORMAL / EF XX123456 GRADE I DIASTOLIC DYSFUNCTION/ MILD MITRAL REGURG. / MILDLY DILATED LEFT ATRIUM/ MILDY INCREASED SYSTOLIC PRESSURE OF PULMONARY ARTERIES  . TRANSVAGINAL SUBURETERAL TAPE/ SLING  09-28-2010   MIXED URINARY INCONTINENCE  . TUBAL LIGATION  1983    Social History   Socioeconomic History  . Marital status: Married    Spouse name: Not on file  . Number of children: 2  . Years of education: 78  . Highest education level: Not on file  Occupational History  . Occupation: disabled    Fish farm manager: UNEMPLOYED  Social Needs  . Financial resource strain: Not on file  . Food insecurity    Worry: Not on file    Inability: Not on file  . Transportation needs    Medical: Not on file    Non-medical: Not on file  Tobacco Use  . Smoking status: Former Smoker    Packs/day: 2.50    Years: 39.00    Pack years: 97.50    Types: Cigarettes    Quit date: 10/22/2010    Years since quitting: 8.7  . Smokeless tobacco: Never Used  Substance and Sexual Activity  . Alcohol use: No    Alcohol/week: 0.0 standard drinks  . Drug use: No  . Sexual activity: Not Currently  Lifestyle  . Physical activity    Days per week: Not on file    Minutes per session: Not on file  . Stress: Not on file  Relationships  . Social Herbalist on phone: Not on file    Gets together: Not on file    Attends religious service: Not on file    Active member of club or organization: Not on file    Attends meetings of clubs or organizations: Not on file    Relationship status: Not on file  Other Topics Concern  . Not on file  Social History Narrative   Lives at home w/ her husband and grandson   Right-handed   Caffeine: 1 cup of  coffee per week + [employer]    Family History  Problem Relation Age of Onset  . Diabetes Mother   . Heart disease Mother   . Dementia Mother   . Heart disease Father   . High blood pressure Father   . Colon cancer Maternal Uncle        x 2  . Breast cancer Other        great aunts x 5    Review of Systems  Constitutional: Negative for fever.  Respiratory: Negative for cough, shortness of breath and wheezing.   Cardiovascular: Positive for leg swelling. Negative for chest pain and palpitations.  Musculoskeletal: Positive for arthralgias and back pain. Negative for neck pain.  Neurological: Positive for headaches. Negative for light-headedness.       Objective:   Vitals:   07/20/19 1252  BP: (!) 154/80  Pulse: 81  Resp: 18  Temp: 98.7 F (37.1 C)  SpO2: 94%   BP Readings from Last 3 Encounters:  07/20/19 (!) 154/80  07/07/19 (!) 144/62  06/23/19 (!) 162/98   Wt Readings from Last 3 Encounters:  07/20/19 287 lb (130.2 kg)  07/07/19 283 lb 8 oz (128.6 kg)  06/23/19 288 lb 6  oz (130.8 kg)   Body mass index is 42.38 kg/m.   Physical Exam    Constitutional: Appears well-developed and well-nourished. No distress.  HENT:  Head: Normocephalic and atraumatic.  Neck: Neck supple. No tracheal deviation present. No thyromegaly present.  No cervical lymphadenopathy Cardiovascular: Normal rate, regular rhythm and normal heart sounds.  No murmur heard. No carotid bruit .  Mild b/l LE edema Pulmonary/Chest: Effort normal and breath sounds normal. No respiratory distress. No has no wheezes. No rales.  Skin: Skin is warm and dry. Not diaphoretic.  Psychiatric: Depressed mood and affect, crying intermittently. Behavior is normal.     Lab Results  Component Value Date   WBC 6.8 07/06/2019   HGB 12.9 07/06/2019   HCT 43.7 07/06/2019   PLT 129 (L) 07/06/2019   GLUCOSE 158 (H) 07/06/2019   CHOL 175 07/06/2019   TRIG 74 07/06/2019   HDL 38 (L) 07/06/2019   LDLCALC 122 (H)  07/06/2019   ALT 21 04/01/2019   AST 29 04/01/2019   NA 137 07/06/2019   K 3.9 07/06/2019   CL 98 07/06/2019   CREATININE 0.89 07/06/2019   BUN 19 07/06/2019   CO2 30 07/06/2019   TSH 1.32 04/01/2019   INR 1.2 (H) 05/28/2019   HGBA1C 8.8 (H) 04/01/2019   MICROALBUR <0.7 09/19/2017    ECHOCARDIOGRAM COMPLETE   ECHOCARDIOGRAM REPORT       Patient Name:   Mary Acosta Date of Exam: 07/07/2019 Medical Rec #:  [id number]             Height:       69.0 in Accession #:    [id number]            Weight:       283.5 lb Date of Birth:  05-Apr-1956            BSA:          2.40 m Patient Age:    3 years              BP:           179/88 mmHg Patient Gender: F                     HR:           72 bpm. Exam Location:  Inpatient  Procedure: 2D Echo  Indications:    chest pain 786.50   History:        Patient has prior history of Echocardiogram examinations, most                 recent 04/10/2018. COPD Risk Factors:Hypertension, Dyslipidemia                 and Diabetes. Risk Factors:Hypertension, Dyslipidemia and                 Diabetes.   Sonographer:    Johny Chess Referring Phys: [phone] [place]    Sonographer Comments: Patient is morbidly obese. Image acquisition challenging due to respiratory motion. IMPRESSIONS   1. Left ventricular ejection fraction, by visual estimation, is 60 to 65%. The left ventricle has normal function. There is no left ventricular hypertrophy.  2. Left ventricular diastolic parameters are consistent with Grade I diastolic dysfunction (impaired relaxation).  3. Global right ventricle has normal systolic function.The right ventricular size is normal. No increase in right ventricular wall thickness.  4. Left atrial size was normal.  5. Right  atrial size was normal.  6. The mitral valve is normal in structure. Trace mitral valve regurgitation. No evidence of mitral stenosis.  7. The tricuspid valve is normal in structure. Tricuspid valve  regurgitation is not demonstrated.  8. The aortic valve was not well visualized. Aortic valve regurgitation is not visualized. No evidence of aortic valve sclerosis or stenosis.  9. The pulmonic valve was normal in structure. Pulmonic valve regurgitation is not visualized. 10. The inferior vena cava is normal in size with <50% respiratory variability, suggesting right atrial pressure of 8 mmHg.  FINDINGS  Left Ventricle: Left ventricular ejection fraction, by visual estimation, is 60 to 65%. The left ventricle has normal function. There is no left ventricular hypertrophy. Left ventricular diastolic parameters are consistent with Grade I diastolic  dysfunction (impaired relaxation). Indeterminate filling pressures.  Right Ventricle: The right ventricular size is normal. No increase in right ventricular wall thickness. Global RV systolic function is has normal systolic function.  Left Atrium: Left atrial size was normal in size.  Right Atrium: Right atrial size was normal in size  Pericardium: There is no evidence of pericardial effusion.  Mitral Valve: The mitral valve is normal in structure. No evidence of mitral valve stenosis by observation. Trace mitral valve regurgitation.  Tricuspid Valve: The tricuspid valve is normal in structure. Tricuspid valve regurgitation is not demonstrated.  Aortic Valve: The aortic valve was not well visualized. Aortic valve regurgitation is not visualized. The aortic valve is structurally normal, with no evidence of sclerosis or stenosis.  Pulmonic Valve: The pulmonic valve was normal in structure. Pulmonic valve regurgitation is not visualized.  Aorta: The aortic root, ascending aorta and aortic arch are all structurally normal, with no evidence of dilitation or obstruction.  Venous: The inferior vena cava is normal in size with less than 50% respiratory variability, suggesting right atrial pressure of 8 mmHg.  IAS/Shunts: No atrial level shunt detected  by color flow Doppler. No ventricular septal defect is seen or detected. There is no evidence of an atrial septal defect.      Diastology LV e' lateral:   8.05 cm/s LV E/e' lateral: 10.5 LV e' medial:    6.20 cm/s LV E/e' medial:  13.7    RIGHT VENTRICLE RV S prime:     10.10 cm/s TAPSE (M-mode): 2.4 cm  LEFT ATRIUM             Index       RIGHT ATRIUM           Index LA Vol (A2C):   71.5 ml 29.84 ml/m RA Area:     10.70 cm LA Vol (A4C):   62.6 ml 26.12 ml/m RA Volume:   23.20 ml  9.68 ml/m LA Biplane Vol: 67.1 ml 28.00 ml/m  AORTIC VALVE LVOT Vmax:   148.00 cm/s LVOT Vmean:  98.000 cm/s LVOT VTI:    0.329 m  MITRAL VALVE MV Area (PHT): 2.34 cm              SHUNTS MV PHT:        93.96 msec            Systemic VTI: 0.33 m MV Decel Time: 324 msec MV E velocity: 84.90 cm/s  103 cm/s MV A velocity: 105.00 cm/s 70.3 cm/s MV E/A ratio:  0.81        1.5    Skeet Latch MD Electronically signed by Skeet Latch MD Signature Date/Time: 07/07/2019/1:55:21 PM      Final  Assessment & Plan:    See Problem List for Assessment and Plan of chronic medical problems.

## 2019-07-20 ENCOUNTER — Ambulatory Visit (INDEPENDENT_AMBULATORY_CARE_PROVIDER_SITE_OTHER): Payer: Medicare HMO | Admitting: Internal Medicine

## 2019-07-20 ENCOUNTER — Other Ambulatory Visit: Payer: Self-pay

## 2019-07-20 ENCOUNTER — Encounter: Payer: Self-pay | Admitting: Internal Medicine

## 2019-07-20 ENCOUNTER — Other Ambulatory Visit: Payer: Self-pay | Admitting: Internal Medicine

## 2019-07-20 ENCOUNTER — Other Ambulatory Visit (INDEPENDENT_AMBULATORY_CARE_PROVIDER_SITE_OTHER): Payer: Medicare HMO

## 2019-07-20 VITALS — BP 154/80 | HR 81 | Temp 98.7°F | Resp 18 | Ht 69.0 in | Wt 287.0 lb

## 2019-07-20 DIAGNOSIS — R0789 Other chest pain: Secondary | ICD-10-CM | POA: Diagnosis not present

## 2019-07-20 DIAGNOSIS — E782 Mixed hyperlipidemia: Secondary | ICD-10-CM | POA: Diagnosis not present

## 2019-07-20 DIAGNOSIS — E1042 Type 1 diabetes mellitus with diabetic polyneuropathy: Secondary | ICD-10-CM | POA: Diagnosis not present

## 2019-07-20 DIAGNOSIS — E039 Hypothyroidism, unspecified: Secondary | ICD-10-CM

## 2019-07-20 DIAGNOSIS — I10 Essential (primary) hypertension: Secondary | ICD-10-CM | POA: Diagnosis not present

## 2019-07-20 DIAGNOSIS — I5032 Chronic diastolic (congestive) heart failure: Secondary | ICD-10-CM

## 2019-07-20 LAB — LIPID PANEL
Cholesterol: 181 mg/dL (ref 0–200)
HDL: 30.8 mg/dL — ABNORMAL LOW (ref >39.00)
LDL Cholesterol: 119 mg/dL — ABNORMAL HIGH (ref 0–99)
NonHDL: 149.73
Total CHOL/HDL Ratio: 6
Triglycerides: 154 mg/dL — ABNORMAL HIGH (ref 0.0–149.0)
VLDL: 30.8 mg/dL (ref 0.0–40.0)

## 2019-07-20 LAB — CBC WITH DIFFERENTIAL/PLATELET
Basophils Absolute: 0.1 10*3/uL (ref 0.0–0.1)
Basophils Relative: 0.9 % (ref 0.0–3.0)
Eosinophils Absolute: 0.1 10*3/uL (ref 0.0–0.7)
Eosinophils Relative: 1.9 % (ref 0.0–5.0)
HCT: 40.5 % (ref 36.0–46.0)
Hemoglobin: 12.8 g/dL (ref 12.0–15.0)
Lymphocytes Relative: 17.6 % (ref 12.0–46.0)
Lymphs Abs: 1.4 10*3/uL (ref 0.7–4.0)
MCHC: 31.6 g/dL (ref 30.0–36.0)
MCV: 79.3 fl (ref 78.0–100.0)
Monocytes Absolute: 0.4 10*3/uL (ref 0.1–1.0)
Monocytes Relative: 5.5 % (ref 3.0–12.0)
Neutro Abs: 5.7 10*3/uL (ref 1.4–7.7)
Neutrophils Relative %: 74.1 % (ref 43.0–77.0)
Platelets: 176 10*3/uL (ref 150.0–400.0)
RBC: 5.1 Mil/uL (ref 3.87–5.11)
RDW: 15.2 % (ref 11.5–15.5)
WBC: 7.8 10*3/uL (ref 4.0–10.5)

## 2019-07-20 LAB — COMPREHENSIVE METABOLIC PANEL
ALT: 22 U/L (ref 0–35)
AST: 37 U/L (ref 0–37)
Albumin: 3.8 g/dL (ref 3.5–5.2)
Alkaline Phosphatase: 115 U/L (ref 39–117)
BUN: 10 mg/dL (ref 6–23)
CO2: 32 mEq/L (ref 19–32)
Calcium: 9.1 mg/dL (ref 8.4–10.5)
Chloride: 93 mEq/L — ABNORMAL LOW (ref 96–112)
Creatinine, Ser: 0.87 mg/dL (ref 0.40–1.20)
GFR: 65.74 mL/min (ref >60.00)
Glucose, Bld: 397 mg/dL — ABNORMAL HIGH (ref 70–99)
Potassium: 4.2 mEq/L (ref 3.5–5.1)
Sodium: 132 mEq/L — ABNORMAL LOW (ref 135–145)
Total Bilirubin: 0.5 mg/dL (ref 0.2–1.2)
Total Protein: 7.5 g/dL (ref 6.0–8.3)

## 2019-07-20 LAB — HEMOGLOBIN A1C: Hgb A1c MFr Bld: 9.4 % — ABNORMAL HIGH (ref 4.6–6.5)

## 2019-07-20 LAB — TSH: TSH: 1.09 u[IU]/mL (ref 0.35–4.50)

## 2019-07-20 MED ORDER — INSULIN GLARGINE 100 UNIT/ML ~~LOC~~ SOLN: 140.0000 [IU] | Freq: Every day | SUBCUTANEOUS | 1 refills | Status: DC

## 2019-07-20 NOTE — Assessment & Plan Note (Signed)
Appears euvolemic Does not take Lasix daily and will not take it daily Continue every other day CMP

## 2019-07-20 NOTE — Assessment & Plan Note (Signed)
Clinically euthyroid Check tsh  Titrate med dose if needed  

## 2019-07-20 NOTE — Telephone Encounter (Signed)
She wanted this prescription sent to pharmacy.  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  Can wait for her to contact us

## 2019-07-20 NOTE — Assessment & Plan Note (Signed)
Blood pressure elevated here today-persistently elevated She is upset here crying intermittently, which could be elevating her blood pressure Continue current medications for now Follow-up in 3 months

## 2019-07-20 NOTE — Assessment & Plan Note (Addendum)
Atypical W/u in hospital negative for cardiac cause Likely MSK Has seen ortho On chronic pain medication Has not had any recurrences If her chest pain recurs she will let me know

## 2019-07-20 NOTE — Patient Instructions (Addendum)
Have the blood work done today that was ordered previously.    Medications reviewed and updated.  Changes include :   none     Please followup in 3 months

## 2019-07-20 NOTE — Assessment & Plan Note (Signed)
No longer following with Dr. Loanne Drilling and she does not want to see a different endocrinologist I will see prescribe her insulin, but would prefer her to see an endocrinologist Discussed the importance to get this better controlled She would like to retry it Lantus-she thinks she can get this covered-we will send a prescription to her pharmacy Currently taking 140 units daily Check A1c

## 2019-07-23 ENCOUNTER — Encounter: Payer: Self-pay | Admitting: Internal Medicine

## 2019-07-28 DIAGNOSIS — H353231 Exudative age-related macular degeneration, bilateral, with active choroidal neovascularization: Secondary | ICD-10-CM | POA: Diagnosis not present

## 2019-07-29 ENCOUNTER — Telehealth: Payer: Self-pay

## 2019-07-29 DIAGNOSIS — Z6841 Body Mass Index (BMI) 40.0 and over, adult: Secondary | ICD-10-CM | POA: Diagnosis not present

## 2019-07-29 DIAGNOSIS — E039 Hypothyroidism, unspecified: Secondary | ICD-10-CM | POA: Diagnosis not present

## 2019-07-29 DIAGNOSIS — R69 Illness, unspecified: Secondary | ICD-10-CM | POA: Diagnosis not present

## 2019-07-29 DIAGNOSIS — G3184 Mild cognitive impairment, so stated: Secondary | ICD-10-CM | POA: Diagnosis not present

## 2019-07-29 DIAGNOSIS — Z794 Long term (current) use of insulin: Secondary | ICD-10-CM | POA: Diagnosis not present

## 2019-07-29 DIAGNOSIS — Z008 Encounter for other general examination: Secondary | ICD-10-CM | POA: Diagnosis not present

## 2019-07-29 DIAGNOSIS — E1142 Type 2 diabetes mellitus with diabetic polyneuropathy: Secondary | ICD-10-CM | POA: Diagnosis not present

## 2019-07-29 DIAGNOSIS — G4700 Insomnia, unspecified: Secondary | ICD-10-CM | POA: Diagnosis not present

## 2019-07-29 DIAGNOSIS — J449 Chronic obstructive pulmonary disease, unspecified: Secondary | ICD-10-CM | POA: Diagnosis not present

## 2019-07-29 NOTE — Telephone Encounter (Signed)
Received PA response from [facility] stating Mary Acosta was denied. However, our office did not request a PA for Basaglar, nor is the pt Rx'd Basaglar by Dr. Loanne Drilling. Provided Dr. Loanne Drilling with denial. Advised pt will require an appt to "sort this out". Routing this message to the front desk for scheduling purposes

## 2019-08-03 NOTE — Telephone Encounter (Signed)
Patient has been scheduled for an appointment on 08/10/19 at 3:15 p.m.

## 2019-08-05 DIAGNOSIS — R69 Illness, unspecified: Secondary | ICD-10-CM | POA: Diagnosis not present

## 2019-08-05 DIAGNOSIS — F4310 Post-traumatic stress disorder, unspecified: Secondary | ICD-10-CM | POA: Diagnosis not present

## 2019-08-05 DIAGNOSIS — F332 Major depressive disorder, recurrent severe without psychotic features: Secondary | ICD-10-CM | POA: Diagnosis not present

## 2019-08-05 DIAGNOSIS — G894 Chronic pain syndrome: Secondary | ICD-10-CM | POA: Diagnosis not present

## 2019-08-06 ENCOUNTER — Other Ambulatory Visit: Payer: Self-pay

## 2019-08-10 ENCOUNTER — Telehealth: Payer: Self-pay

## 2019-08-10 ENCOUNTER — Encounter: Payer: Self-pay | Admitting: Endocrinology

## 2019-08-10 ENCOUNTER — Ambulatory Visit (INDEPENDENT_AMBULATORY_CARE_PROVIDER_SITE_OTHER): Payer: Medicare HMO | Admitting: Endocrinology

## 2019-08-10 DIAGNOSIS — E1042 Type 1 diabetes mellitus with diabetic polyneuropathy: Secondary | ICD-10-CM | POA: Diagnosis not present

## 2019-08-10 MED ORDER — TOUJEO MAX SOLOSTAR 300 UNIT/ML ~~LOC~~ SOPN: 180.0000 [IU] | PEN_INJECTOR | SUBCUTANEOUS | 3 refills | Status: DC

## 2019-08-10 MED ORDER — INSULIN GLARGINE 100 UNIT/ML ~~LOC~~ SOLN: 180.0000 [IU] | SUBCUTANEOUS | 1 refills | Status: DC

## 2019-08-10 MED ORDER — PEN NEEDLES 31G X 8 MM MISC: 1.0000 | Freq: Every day | 3 refills | Status: DC

## 2019-08-10 NOTE — Patient Instructions (Addendum)
Please change the lantus to [place] 180 units each morning.   We'll do the insulin form, and send to you.   check your blood sugar twice a day.  vary the time of day when you check, between before the 3 meals, and at bedtime.  also check if you have symptoms of your blood sugar being too high or too low.  please keep a record of the readings and bring it to your next appointment here (or you can bring the meter itself).  You can write it on any piece of paper.  please call us sooner if your blood sugar goes below 70, or if you have a lot of readings over 200. Please come back for a follow-up appointment in 1 month.

## 2019-08-10 NOTE — Telephone Encounter (Signed)
At Dr. Cordelia Pen request, [employer] application mailed to pt home address.

## 2019-08-10 NOTE — Progress Notes (Signed)
Subjective:    Patient ID: Mary Acosta, female    DOB: Aug 24, 1956, 63 y.o.   MRN: [id number]  HPI  telehealth visit today via doxy video visit.  Alternatives to telehealth are presented to this patient, and the patient agrees to the telehealth visit. Pt is advised of the cost of the visit, and agrees to this, also.   Patient is at home, and I am at the office.   Persons attending the telehealth visit: the patient and I.   Pt returns for f/u of diabetes mellitus: DM type: 1 Dx'ed: [id number] Complications: PN, gastroparesis, and DN.  Therapy: insulin since 2011 GDM: never DKA: once, in 2015 Severe hypoglycemia: never Pancreatitis: never Other: she had gastric banding in 2008.  She then lost 148 lbs, but has since regained 106 of that; she takes human insulin, due to cost; she takes qd insulin, due to missing multiple daily injections   Interval history: no cbg record, but states cbg's vary from 145-225.  It is in general higher as the day goes on.  She says she never misses the insulin.  No new sxs.  She says she cannot afford lantus, but is applying to get it from [facility].  She takes 140 units QAM. pt states she feels well in general.   Past Medical History:  Diagnosis Date  . AKI (acute kidney injury) ([place]) 01/2017  . Anxiety    with panic attacks  . Arthritis    "back; feet; hands; shoulders" (08/26/2014)  . Asthma   . Cervical cancer ([place])   . Chronic lower back pain   . Chronic narcotic use   . Chronic pain syndrome    [facility]  . Cirrhosis ([place])   . Clostridium difficile infection 2017  . COPD (chronic obstructive pulmonary disease) ([place])   . Daily headache   . Depression   . Diabetic neuropathy ([place]) 06/04/2017  . DJD (degenerative joint disease)   . Fatty liver disease, nonalcoholic   . Fibromyalgia   . Frequency of urination   . HCAP (healthcare-associated pneumonia) 08/26/2014  . History of TIA (transient ischemic attack) 11-01-2010   NO  RESIDUAL  . Hyperlipidemia   . Hypertension   . Hypothyroidism   . IDDM (insulin dependent diabetes mellitus)   . Insomnia   . Lumbar stenosis   . Memory difficulty 07/25/2016  . Nocturia   . OSA (obstructive sleep apnea)    NO CPAP SINCE WT LOSS  . Osteoarthritis    with severe disease in knee  . Pneumonia "several times"  . Polymyalgia rheumatica ([place])   . Scoliosis   . Seasonal allergies   . Thyroid cancer ([place])   . Urgency of urination   . Vaginal pain S/P SLING  FEB 2012    Past Surgical History:  Procedure Laterality Date  . APPENDECTOMY  1982  . BREAST EXCISIONAL BIOPSY Left 02/28/2005   Atypical Ductal Hyperplasia  . CARDIAC CATHETERIZATION  09-04-2004   NORMAL CORONARY ANATOMY/ NORMAL LVF/ EF 60%  . CARDIOVASCULAR STRESS TEST  12-27-2010  DR [place]   ABNORMAL NUCLEAR STUDY W/ /MILD INFERIOR ISCHEMIA/ EF 69%/  CT HEART ANGIOGRAM ;  NO ACUTE FINDINGS  . CRYOABLATION  05/16/2003   w/LEEP FOR ABNORMAL PAP SMEAR  . CYSTOSCOPY  05/18/2012   Procedure: CYSTOSCOPY;  Surgeon: Reece Packer, MD;  Location: [facility];  Service: Urology;  Laterality: N/A;  examination under anethesia  . ESOPHAGOGASTRODUODENOSCOPY (EGD) WITH PROPOFOL N/A  09/03/2016   Procedure: ESOPHAGOGASTRODUODENOSCOPY (EGD) WITH PROPOFOL;  Surgeon: Doran Stabler, MD;  Location: [facility];  Service: Gastroenterology;  Laterality: N/A;  . HYSTEROSCOPY W/D&C  08-19-2007   PMB  . KNEE ARTHROSCOPY W/ DEBRIDEMENT Left 03/29/2006   INTERNAL DERANGEMENT/ SEVERE DJD/ MENISCUS TEARS  . LAPAROSCOPIC CHOLECYSTECTOMY  06-10-2002  . LAPAROSCOPIC GASTRIC BANDING  03/01/2006   TRUNCAL VAGOTOMY/ PLACEMENT OF VG BAND  . REVISION TOTAL KNEE ARTHROPLASTY Left 08-29-2008; 05/2009  . TONSILLECTOMY  1969  . TOTAL KNEE ARTHROPLASTY Left 01-23-2007   SEVERE DJD  . TOTAL THYROIDECTOMY  11-22-2005   BILATERAL THYROID NODULES-- PAPILLARY CARCINOMA (0.5CM)/ ADENOMATOID NODULES  . TRANSTHORACIC  ECHOCARDIOGRAM  12-27-2010   LVSF NORMAL / EF [id number] GRADE I DIASTOLIC DYSFUNCTION/ MILD MITRAL REGURG. / MILDLY DILATED LEFT ATRIUM/ MILDY INCREASED SYSTOLIC PRESSURE OF PULMONARY ARTERIES  . TRANSVAGINAL SUBURETERAL TAPE/ SLING  09-28-2010   MIXED URINARY INCONTINENCE  . TUBAL LIGATION  1983    Social History   Socioeconomic History  . Marital status: Married    Spouse name: Not on file  . Number of children: 2  . Years of education: 5  . Highest education level: Not on file  Occupational History  . Occupation: disabled    Fish farm manager: UNEMPLOYED  Tobacco Use  . Smoking status: Former Smoker    Packs/day: 2.50    Years: 39.00    Pack years: 97.50    Types: Cigarettes    Quit date: 10/22/2010    Years since quitting: 8.8  . Smokeless tobacco: Never Used  Substance and Sexual Activity  . Alcohol use: No    Alcohol/week: 0.0 standard drinks  . Drug use: No  . Sexual activity: Not Currently  Other Topics Concern  . Not on file  Social History Narrative   Lives at home w/ her husband and grandson   Right-handed   Caffeine: 1 cup of coffee per week + [employer]epsi   Social Determinants of Health   Financial Resource Strain:   . Difficulty of Paying Living Expenses: Not on file  Food Insecurity:   . Worried About Charity fundraiser in the Last Year: Not on file  . Ran Out of Food in the Last Year: Not on file  Transportation Needs:   . Lack of Transportation (Medical): Not on file  . Lack of Transportation (Non-Medical): Not on file  Physical Activity:   . Days of Exercise per Week: Not on file  . Minutes of Exercise per Session: Not on file  Stress:   . Feeling of Stress : Not on file  Social Connections:   . Frequency of Communication with Friends and Family: Not on file  . Frequency of Social Gatherings with Friends and Family: Not on file  . Attends Religious Services: Not on file  . Active Member of Clubs or Organizations: Not on file  . Attends Archivist  Meetings: Not on file  . Marital Status: Not on file  Intimate Partner Violence:   . Fear of Current or Ex-Partner: Not on file  . Emotionally Abused: Not on file  . Physically Abused: Not on file  . Sexually Abused: Not on file    Current Outpatient Medications on File Prior to Visit  Medication Sig Dispense Refill  . acetaminophen (TYLENOL) 500 MG tablet Take 1,000 mg by mouth 2 (two) times daily.    Marland Kitchen albuterol (VENTOLIN HFA) 108 (90 Base) MCG/ACT inhaler TAKE 2 PUFFS BY MOUTH EVERY 6 HOURS AS NEEDED  FOR WHEEZE OR SHORTNESS OF BREATH (Patient taking differently: Inhale 2 puffs into the lungs every 6 (six) hours as needed for wheezing or shortness of breath. ) 18 g 11  . amitriptyline (ELAVIL) 100 MG tablet Take 100 mg by mouth at bedtime.    Marland Kitchen amLODipine (NORVASC) 5 MG tablet TAKE 1 TABLET BY MOUTH TWICE A DAY (Patient taking differently: Take 5 mg by mouth 2 (two) times daily. ) 180 tablet 1  . aspirin EC 81 MG tablet Take 1 tablet (81 mg total) by mouth daily. 90 tablet 3  . furosemide (LASIX) 20 MG tablet TAKE 1 TABLET BY MOUTH EVERY DAY (Patient taking differently: Take 20 mg by mouth daily. ) 90 tablet 2  . gabapentin (NEURONTIN) 300 MG capsule Take 300 mg by mouth 3 (three) times daily.    . hydrALAZINE (APRESOLINE) 100 MG tablet Take 1 tablet (100 mg total) by mouth 3 (three) times daily. (Patient taking differently: Take 100 mg by mouth 2 (two) times daily. ) 90 tablet 5  . ipratropium-albuterol (DUONEB) 0.5-2.5 (3) MG/3ML SOLN Take 3 mLs by nebulization every 6 (six) hours as needed (Wheezing or dyspnea.).    Marland Kitchen levothyroxine (SYNTHROID) 175 MCG tablet TAKE 1 TABLET BY MOUTH SIX DAYS A WEEK BEFORE BREAKFAST (Patient taking differently: Take 175 mcg by mouth daily before breakfast. ) 90 tablet 1  . Lidocaine HCl (ASPERCREME LIDOCAINE) 4 % LIQD Apply 1 application topically as needed (pain).     . metoprolol tartrate (LOPRESSOR) 25 MG tablet Take 1 tablet (25 mg total) by mouth 2 (two)  times daily. 180 tablet 3  . mirtazapine (REMERON) 15 MG tablet Take 15 mg by mouth at bedtime. Take with 30mg  tablet, total 45mg     . mirtazapine (REMERON) 30 MG tablet Take 30 mg by mouth at bedtime. Take with 15mg  tablet, total 45mg     . morphine (MS CONTIN) 30 MG 12 hr tablet Take 30 mg by mouth 2 (two) times daily.    Marland Kitchen morphine (MSIR) 15 MG tablet Take 15 mg by mouth daily.    . ondansetron (ZOFRAN-ODT) 4 MG disintegrating tablet Take 1 tablet (4 mg total) by mouth every 6 (six) hours as needed for nausea or vomiting. 30 tablet 3  . potassium chloride SA (KLOR-CON M20) 20 MEQ tablet TAKE 2 TABLETS EVERY DAY AS NEEDED FOR CRAMPING (Patient taking differently: Take 40 mEq by mouth daily as needed (cramping). ) 180 tablet 1  . promethazine (PHENERGAN) 25 MG tablet TAKE 1 TABLET (25 MG TOTAL) BY MOUTH EVERY 8 (EIGHT) HOURS AS NEEDED FOR FOR NAUSEA OR VOMITING 10 tablet 0  . spironolactone (ALDACTONE) 25 MG tablet Take 1 tablet (25 mg total) by mouth daily. CAUSES PAIN ONLY TAKES WHEN BP IS ELEVATED 90 tablet 0  . tiZANidine (ZANAFLEX) 2 MG tablet Take 2 mg by mouth as needed for muscle spasms.   2   No current facility-administered medications on file prior to visit.    Allergies  Allergen Reactions  . Gabapentin Swelling    Swelling in legs Swelling in legs Swelling in legs  . Losartan Other (See Comments)    Myalgias and muscle cramping Other reaction(s): Other (See Comments) Myalgias and muscle cramping Myalgias and muscle cramping Other reaction(s): Other (See Comments) Myalgias and muscle cramping  . Aricept [Donepezil Hcl]     Nausea/vomiting, low BP  . Oxycodone Itching  . Sulfa Antibiotics Nausea Only and Rash  . Sulfonamide Derivatives Nausea Only    Family  History  Problem Relation Age of Onset  . Diabetes Mother   . Heart disease Mother   . Dementia Mother   . Heart disease Father   . High blood pressure Father   . Colon cancer Maternal Uncle        x 2  .  Breast cancer Other        great aunts x 5    There were no vitals taken for this visit. Review of Systems She denies hypoglycemia    Objective:   Physical Exam      Assessment & Plan:  Insulin-requiring type 2 DM, with PN: worse.    Patient Instructions  Please change the lantus to [place] 180 units each morning.   We'll do the insulin form, and send to you.   check your blood sugar twice a day.  vary the time of day when you check, between before the 3 meals, and at bedtime.  also check if you have symptoms of your blood sugar being too high or too low.  please keep a record of the readings and bring it to your next appointment here (or you can bring the meter itself).  You can write it on any piece of paper.  please call us sooner if your blood sugar goes below 70, or if you have a lot of readings over 200. Please come back for a follow-up appointment in 1 month.

## 2019-08-11 ENCOUNTER — Telehealth: Payer: Self-pay

## 2019-08-11 NOTE — Telephone Encounter (Signed)
[place]: [facility]  Document: New Rx Other records requested: None requested Misc: Pt states she will submit her PAP on 08/26/19 for 2021 enrollment period.  All above requested information has been faxed successfully to [employer] listed above. Documents and fax confirmation have been placed in the faxed file for future reference.

## 2019-08-12 DIAGNOSIS — J449 Chronic obstructive pulmonary disease, unspecified: Secondary | ICD-10-CM | POA: Diagnosis not present

## 2019-08-13 DIAGNOSIS — J449 Chronic obstructive pulmonary disease, unspecified: Secondary | ICD-10-CM | POA: Diagnosis not present

## 2019-08-18 DIAGNOSIS — S82001A Unspecified fracture of right patella, initial encounter for closed fracture: Secondary | ICD-10-CM | POA: Insufficient documentation

## 2019-08-18 DIAGNOSIS — M25561 Pain in right knee: Secondary | ICD-10-CM | POA: Diagnosis not present

## 2019-08-18 DIAGNOSIS — Z6841 Body Mass Index (BMI) 40.0 and over, adult: Secondary | ICD-10-CM | POA: Diagnosis not present

## 2019-08-18 DIAGNOSIS — M1711 Unilateral primary osteoarthritis, right knee: Secondary | ICD-10-CM | POA: Diagnosis not present

## 2019-08-23 ENCOUNTER — Telehealth: Payer: Self-pay | Admitting: Gastroenterology

## 2019-08-23 ENCOUNTER — Other Ambulatory Visit: Payer: Self-pay | Admitting: *Deleted

## 2019-08-23 DIAGNOSIS — R110 Nausea: Secondary | ICD-10-CM

## 2019-08-23 MED ORDER — ONDANSETRON 4 MG PO TBDP: 4.0000 mg | ORAL_TABLET | Freq: Four times a day (QID) | ORAL | 3 refills | Status: DC | PRN

## 2019-08-23 NOTE — Telephone Encounter (Signed)
Yes can renew original script from Dr. Loletha Carrow which was 4mg  ODT every 6 hours as needed. Thanks

## 2019-08-23 NOTE — Telephone Encounter (Signed)
Danis patient, sent to AM DOD Dr. Havery Moros:   Spoke to the patient who is complaining of 3-4 days severe bloating after eating very little. "My gastroparesis is acting up" she states. She reports she is taking massive amounts of extra strength gas-x which is not helping. She also states her psychiatrist increased her Remeron dose to 60 mg daily (which she reported has stopped as of last night) and noticed with this change and increase in bloating and stomach discomfort. Please advise.

## 2019-08-23 NOTE — Telephone Encounter (Signed)
Sorry to hear this. Chart reviewed, she has known gastroparesis, has a history of wide complex tachycardia and has not been able to use Reglan. If the gas-ex is not helping she should stop it. If she did not tolerate higher dose Remeron would hold off on the increase. She can use some Zofran as needed to see if that will help if she has any nausea  / vomiting. Can you ask if her glucose levels are okay, as high BGs have caused issues for her in the past and wonder if that is contributing. She may want to stick with a liquid diet for the next 1-2 days and allow her stomach to better empty if she hasn't tried that yet.

## 2019-08-23 NOTE — Telephone Encounter (Signed)
Thanks Brittani, I think the elevated glucose levels are making her feel poorly / making gastroparesis worse, agree with your recommendations

## 2019-08-23 NOTE — Telephone Encounter (Signed)
Please advise Zofran dosing or do you want me to renew original script sent in by Dr. Loletha Carrow in August?

## 2019-08-23 NOTE — Telephone Encounter (Signed)
Spoke to the patient who took her BG will on the phone. It was a talking glucometer. This RN heard the machine say her BG was 456. She reports she does not understand why it's so elevated being that she has not eaten very much. She was told to follow up with her PCP concerning better control her her BG. She stated her elevated BG was due to steroid shots she was receiving in her knees. She stated she would try a liquid diet and watch her sugar intake.

## 2019-08-24 ENCOUNTER — Telehealth: Payer: Self-pay

## 2019-08-24 DIAGNOSIS — Z5181 Encounter for therapeutic drug level monitoring: Secondary | ICD-10-CM | POA: Diagnosis not present

## 2019-08-24 DIAGNOSIS — I10 Essential (primary) hypertension: Secondary | ICD-10-CM | POA: Diagnosis not present

## 2019-08-24 DIAGNOSIS — M533 Sacrococcygeal disorders, not elsewhere classified: Secondary | ICD-10-CM | POA: Diagnosis not present

## 2019-08-24 DIAGNOSIS — M353 Polymyalgia rheumatica: Secondary | ICD-10-CM | POA: Diagnosis not present

## 2019-08-24 DIAGNOSIS — E119 Type 2 diabetes mellitus without complications: Secondary | ICD-10-CM | POA: Diagnosis not present

## 2019-08-24 DIAGNOSIS — M5136 Other intervertebral disc degeneration, lumbar region: Secondary | ICD-10-CM | POA: Diagnosis not present

## 2019-08-24 DIAGNOSIS — Z794 Long term (current) use of insulin: Secondary | ICD-10-CM | POA: Diagnosis not present

## 2019-08-24 DIAGNOSIS — G894 Chronic pain syndrome: Secondary | ICD-10-CM | POA: Diagnosis not present

## 2019-08-24 NOTE — Telephone Encounter (Signed)
Not on statin - will not prescribe at this point due to her cirrhosis

## 2019-08-24 NOTE — Telephone Encounter (Signed)
Copied from [place] [id number]. Topic: General - Other >> Aug 18, 2019  3:03 PM Antonieta Iba C wrote: Reason for CRM: pt's pharmacy called in to update provider, they noticed that the pt isn't taking a statin, per pharmacy, usually people with pt's history should be. They would like for provider to follow up with Pt in regards to taking a satin medication.   [facility] RN case Manager [phone]

## 2019-09-02 ENCOUNTER — Other Ambulatory Visit: Payer: Self-pay | Admitting: Internal Medicine

## 2019-09-08 DIAGNOSIS — M170 Bilateral primary osteoarthritis of knee: Secondary | ICD-10-CM | POA: Diagnosis not present

## 2019-09-08 DIAGNOSIS — S82001A Unspecified fracture of right patella, initial encounter for closed fracture: Secondary | ICD-10-CM | POA: Diagnosis not present

## 2019-09-08 DIAGNOSIS — M1712 Unilateral primary osteoarthritis, left knee: Secondary | ICD-10-CM | POA: Diagnosis not present

## 2019-09-08 DIAGNOSIS — M1711 Unilateral primary osteoarthritis, right knee: Secondary | ICD-10-CM | POA: Diagnosis not present

## 2019-09-09 DIAGNOSIS — H353231 Exudative age-related macular degeneration, bilateral, with active choroidal neovascularization: Secondary | ICD-10-CM | POA: Diagnosis not present

## 2019-09-09 DIAGNOSIS — H43813 Vitreous degeneration, bilateral: Secondary | ICD-10-CM | POA: Diagnosis not present

## 2019-09-09 DIAGNOSIS — H35423 Microcystoid degeneration of retina, bilateral: Secondary | ICD-10-CM | POA: Diagnosis not present

## 2019-09-09 DIAGNOSIS — H35033 Hypertensive retinopathy, bilateral: Secondary | ICD-10-CM | POA: Diagnosis not present

## 2019-09-12 DIAGNOSIS — J449 Chronic obstructive pulmonary disease, unspecified: Secondary | ICD-10-CM | POA: Diagnosis not present

## 2019-09-18 DIAGNOSIS — E119 Type 2 diabetes mellitus without complications: Secondary | ICD-10-CM | POA: Diagnosis not present

## 2019-09-29 DIAGNOSIS — F419 Anxiety disorder, unspecified: Secondary | ICD-10-CM | POA: Diagnosis not present

## 2019-09-29 DIAGNOSIS — R69 Illness, unspecified: Secondary | ICD-10-CM | POA: Diagnosis not present

## 2019-09-30 DIAGNOSIS — F419 Anxiety disorder, unspecified: Secondary | ICD-10-CM | POA: Diagnosis not present

## 2019-09-30 DIAGNOSIS — R69 Illness, unspecified: Secondary | ICD-10-CM | POA: Diagnosis not present

## 2019-09-30 DIAGNOSIS — G894 Chronic pain syndrome: Secondary | ICD-10-CM | POA: Diagnosis not present

## 2019-09-30 DIAGNOSIS — F4310 Post-traumatic stress disorder, unspecified: Secondary | ICD-10-CM | POA: Diagnosis not present

## 2019-09-30 DIAGNOSIS — F4329 Adjustment disorder with other symptoms: Secondary | ICD-10-CM | POA: Diagnosis not present

## 2019-10-04 ENCOUNTER — Other Ambulatory Visit: Payer: Self-pay | Admitting: Internal Medicine

## 2019-10-13 ENCOUNTER — Other Ambulatory Visit: Payer: Self-pay | Admitting: Endocrinology

## 2019-10-13 DIAGNOSIS — E1042 Type 1 diabetes mellitus with diabetic polyneuropathy: Secondary | ICD-10-CM

## 2019-10-13 DIAGNOSIS — J449 Chronic obstructive pulmonary disease, unspecified: Secondary | ICD-10-CM | POA: Diagnosis not present

## 2019-10-18 DIAGNOSIS — M5137 Other intervertebral disc degeneration, lumbosacral region: Secondary | ICD-10-CM | POA: Diagnosis not present

## 2019-10-18 DIAGNOSIS — Z5181 Encounter for therapeutic drug level monitoring: Secondary | ICD-10-CM | POA: Diagnosis not present

## 2019-10-18 DIAGNOSIS — M533 Sacrococcygeal disorders, not elsewhere classified: Secondary | ICD-10-CM | POA: Diagnosis not present

## 2019-10-18 DIAGNOSIS — G894 Chronic pain syndrome: Secondary | ICD-10-CM | POA: Diagnosis not present

## 2019-10-19 DIAGNOSIS — M1711 Unilateral primary osteoarthritis, right knee: Secondary | ICD-10-CM | POA: Diagnosis not present

## 2019-10-19 DIAGNOSIS — M25561 Pain in right knee: Secondary | ICD-10-CM | POA: Diagnosis not present

## 2019-10-21 NOTE — Progress Notes (Signed)
Subjective:    Patient ID: Mary Acosta, female    DOB: [birth year], 64 y.o.   MRN: [id number]  HPI The patient is here for follow up of their chronic medical problems, including hypertension, diabetes, hyperlipidemia, hypothyroidism, Grief/depression/anxiety   She is taking all of her medications as prescribed.   She is working on getting her pain medication dose the lowest as possible.   Her sugars have been high.   She has been taking lantus daily 130-180 units depending on how high her sugar is. She buys her insulin because it is too expensive with insurance.  Sometimes a friend will give her insulin.  She has not followed up with the endocrinologist as advised and would like to not go back.  She is not eating as good as she should.   She does feel her depression and anxiety are better.  She is following with the psychiatrist.     Medications and allergies reviewed with patient and updated if appropriate.  Patient Active Problem List   Diagnosis Date Noted  . Grief 02/03/2019  . Nausea 01/04/2019  . Reactive airway disease 10/21/2018  . Arthralgia 09/04/2018  . Narcotic overdose ([place]) 05/14/2018  . Thyroid cancer ([place]) 04/30/2018  . Obesity hypoventilation syndrome ([place]) 09/24/2017  . Palpitations 09/18/2017  . Diabetic neuropathy ([place]) 06/04/2017  . Vitamin B12 deficiency 03/05/2017  . Numbness and tingling in both hands 02/24/2017  . OSA (obstructive sleep apnea) 01/31/2017  . Liver cirrhosis secondary to NASH ([place]) 01/31/2017  . Hypothyroidism 01/31/2017  . Chronic narcotic use 01/31/2017  . Fibromyalgia 01/31/2017  . Hyperlipidemia 01/31/2017  . Gastroparesis   . Memory disorder 07/25/2016  . Lump in neck 01/29/2016  . Chronic diastolic (congestive) heart failure ([place]) 01/03/2016  . Fatty liver 01/03/2016  . Type 1 diabetes mellitus ([place]) 01/03/2016  . Recurrent Clostridium difficile diarrhea 01/03/2016  . Chronic respiratory failure ([place]) 11/10/2014    . Physical deconditioning 09/26/2014  . Dyspnea 09/26/2014  . Iron deficiency anemia, unspecified  04/05/2011  . Bariatric surgery status 04/05/2011  . Left arm pain   . UNSPECIFIED VITAMIN D DEFICIENCY 10/22/2007  . LOW BACK PAIN, CHRONIC 10/22/2007  . INSOMNIA 10/22/2007  . Obesity 10/14/2007  . Chronic pain syndrome 10/14/2007  . CARPAL TUNNEL SYNDROME 10/14/2007  . ALLERGIC RHINITIS CAUSE UNSPECIFIED 10/14/2007  . ARTHRITIS 10/14/2007  . Anxiety state 08/25/2007  . Depression 08/25/2007  . Essential hypertension 08/25/2007  . ASTHMA 08/25/2007  . CONSTIPATION 08/25/2007  . POLYMYALGIA RHEUMATICA 08/25/2007  . LEG EDEMA, BILATERAL 08/25/2007    Current Outpatient Medications on File Prior to Visit  Medication Sig Dispense Refill  . acetaminophen (TYLENOL) 500 MG tablet Take 1,000 mg by mouth 2 (two) times daily.    Marland Kitchen albuterol (VENTOLIN HFA) 108 (90 Base) MCG/ACT inhaler TAKE 2 PUFFS BY MOUTH EVERY 6 HOURS AS NEEDED FOR WHEEZE OR SHORTNESS OF BREATH (Patient taking differently: Inhale 2 puffs into the lungs every 6 (six) hours as needed for wheezing or shortness of breath. ) 18 g 11  . amitriptyline (ELAVIL) 100 MG tablet Take 100 mg by mouth at bedtime.    Marland Kitchen amLODipine (NORVASC) 5 MG tablet TAKE 1 TABLET BY MOUTH TWICE A DAY 180 tablet 1  . aspirin EC 81 MG tablet Take 1 tablet (81 mg total) by mouth daily. 90 tablet 3  . celecoxib (CELEBREX) 100 MG capsule celecoxib 100 mg capsule    . furosemide (LASIX) 20 MG tablet TAKE 1 TABLET  BY MOUTH EVERY DAY (Patient taking differently: Take 20 mg by mouth daily. ) 90 tablet 2  . gabapentin (NEURONTIN) 300 MG capsule Take 300 mg by mouth 3 (three) times daily.    . hydrALAZINE (APRESOLINE) 100 MG tablet Take 1 tablet (100 mg total) by mouth 3 (three) times daily. (Patient taking differently: Take 100 mg by mouth 2 (two) times daily. ) 90 tablet 5  . hydrALAZINE (APRESOLINE) 25 MG tablet TAKE 1 TABLET BY MOUTH THREE TIMES A DAY 270  tablet 1  . Insulin Pen Needle (PEN NEEDLES) 31G X 8 MM MISC 1 Device by Does not apply route daily. 90 each 3  . ipratropium-albuterol (DUONEB) 0.5-2.5 (3) MG/3ML SOLN Take 3 mLs by nebulization every 6 (six) hours as needed (Wheezing or dyspnea.).    Marland Kitchen levothyroxine (SYNTHROID) 175 MCG tablet TAKE 1 TABLET BY MOUTH SIX DAYS A WEEK BEFORE BREAKFAST (Patient taking differently: Take 175 mcg by mouth daily before breakfast. ) 90 tablet 1  . metoprolol tartrate (LOPRESSOR) 25 MG tablet Take 1 tablet (25 mg total) by mouth 2 (two) times daily. 180 tablet 3  . morphine (MS CONTIN) 30 MG 12 hr tablet Take 30 mg by mouth 2 (two) times daily.    . ondansetron (ZOFRAN-ODT) 4 MG disintegrating tablet Take 1 tablet (4 mg total) by mouth every 6 (six) hours as needed for nausea or vomiting. 30 tablet 3  . potassium chloride SA (KLOR-CON M20) 20 MEQ tablet TAKE 2 TABLETS EVERY DAY AS NEEDED FOR CRAMPING (Patient taking differently: Take 40 mEq by mouth daily as needed (cramping). ) 180 tablet 1  . spironolactone (ALDACTONE) 25 MG tablet TAKE 1 TABLET (25 MG TOTAL) BY MOUTH DAILY. CAUSES PAIN ONLY TAKES WHEN BP IS ELEVATED 90 tablet 0  . tiZANidine (ZANAFLEX) 2 MG tablet Take 2 mg by mouth as needed for muscle spasms.   2  . mirtazapine (REMERON) 45 MG tablet mirtazapine 45 mg tablet  TAKE 1 TABLET BY MOUTH EVERYDAY AT BEDTIME    . PARoxetine (PAXIL) 20 MG tablet paroxetine 20 mg tablet  TAKE 1 TABLET BY MOUTH EVERY DAY     No current facility-administered medications on file prior to visit.    Past Medical History:  Diagnosis Date  . AKI (acute kidney injury) ([place]) 01/2017  . Anxiety    with panic attacks  . Arthritis    "back; feet; hands; shoulders" (08/26/2014)  . Asthma   . Cervical cancer ([place])   . Chronic lower back pain   . Chronic narcotic use   . Chronic pain syndrome    [facility]  . Cirrhosis ([place])   . Clostridium difficile infection 2017  . COPD (chronic obstructive  pulmonary disease) ([place])   . Daily headache   . Depression   . Diabetic neuropathy ([place]) 06/04/2017  . DJD (degenerative joint disease)   . Fatty liver disease, nonalcoholic   . Fibromyalgia   . Frequency of urination   . HCAP (healthcare-associated pneumonia) 08/26/2014  . History of TIA (transient ischemic attack) 11-01-2010   NO RESIDUAL  . Hyperlipidemia   . Hypertension   . Hypothyroidism   . IDDM (insulin dependent diabetes mellitus)   . Insomnia   . Lumbar stenosis   . Memory difficulty 07/25/2016  . Nocturia   . OSA (obstructive sleep apnea)    NO CPAP SINCE WT LOSS  . Osteoarthritis    with severe disease in knee  . Pneumonia "several times"  .  Polymyalgia rheumatica ([place])   . Scoliosis   . Seasonal allergies   . Thyroid cancer ([place])   . Urgency of urination   . Vaginal pain S/P SLING  FEB 2012    Past Surgical History:  Procedure Laterality Date  . APPENDECTOMY  1982  . BREAST EXCISIONAL BIOPSY Left 02/28/2005   Atypical Ductal Hyperplasia  . CARDIAC CATHETERIZATION  09-04-2004   NORMAL CORONARY ANATOMY/ NORMAL LVF/ EF 60%  . CARDIOVASCULAR STRESS TEST  12-27-2010  DR [place]   ABNORMAL NUCLEAR STUDY W/ /MILD INFERIOR ISCHEMIA/ EF 69%/  CT HEART ANGIOGRAM ;  NO ACUTE FINDINGS  . CRYOABLATION  05/16/2003   w/LEEP FOR ABNORMAL PAP SMEAR  . CYSTOSCOPY  05/18/2012   Procedure: CYSTOSCOPY;  Surgeon: Reece Packer, MD;  [facility];  Service: Urology;  Laterality: N/A;  examination under anethesia  . ESOPHAGOGASTRODUODENOSCOPY (EGD) WITH PROPOFOL N/A 09/03/2016   Procedure: ESOPHAGOGASTRODUODENOSCOPY (EGD) WITH PROPOFOL;  Surgeon: Doran Stabler, MD;  Location: [facility];  Service: Gastroenterology;  Laterality: N/A;  . HYSTEROSCOPY WITH D & C  08-19-2007   PMB  . KNEE ARTHROSCOPY W/ DEBRIDEMENT Left 03/29/2006   INTERNAL DERANGEMENT/ SEVERE DJD/ MENISCUS TEARS  . LAPAROSCOPIC CHOLECYSTECTOMY  06-10-2002  . LAPAROSCOPIC GASTRIC  BANDING  03/01/2006   TRUNCAL VAGOTOMY/ PLACEMENT OF VG BAND  . REVISION TOTAL KNEE ARTHROPLASTY Left 08-29-2008; 05/2009  . TONSILLECTOMY  1969  . TOTAL KNEE ARTHROPLASTY Left 01-23-2007   SEVERE DJD  . TOTAL THYROIDECTOMY  11-22-2005   BILATERAL THYROID NODULES-- PAPILLARY CARCINOMA (0.5CM)/ ADENOMATOID NODULES  . TRANSTHORACIC ECHOCARDIOGRAM  12-27-2010   LVSF NORMAL / EF [id number] GRADE I DIASTOLIC DYSFUNCTION/ MILD MITRAL REGURG. / MILDLY DILATED LEFT ATRIUM/ MILDY INCREASED SYSTOLIC PRESSURE OF PULMONARY ARTERIES  . TRANSVAGINAL SUBURETERAL TAPE/ SLING  09-28-2010   MIXED URINARY INCONTINENCE  . TUBAL LIGATION  1983    Social History   Socioeconomic History  . Marital status: Married    Spouse name: Not on file  . Number of children: 2  . Years of education: 56  . Highest education level: Not on file  Occupational History  . Occupation: disabled    Fish farm manager: UNEMPLOYED  Tobacco Use  . Smoking status: Former Smoker    Packs/day: 2.50    Years: 39.00    Pack years: 97.50    Types: Cigarettes    Quit date: 10/22/2010    Years since quitting: 9.0  . Smokeless tobacco: Never Used  Substance and Sexual Activity  . Alcohol use: No    Alcohol/week: 0.0 standard drinks  . Drug use: No  . Sexual activity: Not Currently  Other Topics Concern  . Not on file  Social History Narrative   Lives at home w/ her husband and grandson   Right-handed   Caffeine: 1 cup of coffee per week + [employer]eps[employer]   Social Determinants of Health   Financial Resource Strain:   . Difficulty of Paying Living Expenses: Not on file  Food Insecurity:   . Worried About Charity fundraiser in the Last Year: Not on file  . Ran Out of Food in the Last Year: Not on file  Transportation Needs:   . Lack of Transportation (Medical): Not on file  . Lack of Transportation (Non-Medical): Not on file  Physical Activity:   . Days of Exercise per Week: Not on file  . Minutes of Exercise per Session: Not on file    Stress:   . Feeling of  Stress : Not on file  Social Connections:   . Frequency of Communication with Friends and Family: Not on file  . Frequency of Social Gatherings with Friends and Family: Not on file  . Attends Religious Services: Not on file  . Active Member of Clubs or Organizations: Not on file  . Attends Archivist Meetings: Not on file  . Marital Status: Not on file    Family History  Problem Relation Age of Onset  . Diabetes Mother   . Heart disease Mother   . Dementia Mother   . Heart disease Father   . High blood pressure Father   . Colon cancer Maternal Uncle        x 2  . Breast cancer Other        great aunts x 5    Review of Systems  Constitutional: Negative for chills and fever.  Respiratory: Positive for shortness of breath (chronic - improved with decreasing pain meds). Negative for cough and wheezing.   Cardiovascular: Positive for palpitations (occ) and leg swelling. Negative for chest pain.  Neurological: Negative for dizziness, light-headedness and headaches.       Objective:   Vitals:   10/22/19 1317  BP: (!) 146/70  Pulse: 82  Resp: 16  Temp: 98.5 F (36.9 C)  SpO2: 95%   BP Readings from Last 3 Encounters:  10/22/19 (!) 146/70  07/20/19 (!) 154/80  07/07/19 (!) 144/62   Wt Readings from Last 3 Encounters:  10/22/19 291 lb (132 kg)  07/20/19 287 lb (130.2 kg)  07/07/19 283 lb 8 oz (128.6 kg)   Body mass index is 42.97 kg/m.   Physical Exam    Constitutional: Appears well-developed and well-nourished. No distress.  HENT:  Head: Normocephalic and atraumatic.  Neck: Neck supple. No tracheal deviation present. No thyromegaly present.  No cervical lymphadenopathy Cardiovascular: Normal rate, regular rhythm and normal heart sounds.   No murmur heard. No carotid bruit .  Mild b/l LE edema Pulmonary/Chest: Effort normal and breath sounds normal. No respiratory distress. No has no wheezes. No rales.  Skin: Skin is warm and  dry. Not diaphoretic.  Psychiatric: Normal mood and affect. Behavior is normal.      Assessment & Plan:    See Problem List for Assessment and Plan of chronic medical problems.    This visit occurred during the SARS-CoV-2 public health emergency.  Safety protocols were in place, including screening questions prior to the visit, additional usage of staff PPE, and extensive cleaning of exam room while observing appropriate contact time as indicated for disinfecting solutions.

## 2019-10-21 NOTE — Patient Instructions (Addendum)
  Blood work was ordered.     Medications reviewed and updated.  Changes include :   none  Your prescription(s) have been submitted to your pharmacy. Please take as directed and contact our office if you believe you are having problem(s) with the medication(s).    Please followup in 3 months

## 2019-10-22 ENCOUNTER — Ambulatory Visit (INDEPENDENT_AMBULATORY_CARE_PROVIDER_SITE_OTHER): Payer: Medicare HMO | Admitting: Internal Medicine

## 2019-10-22 ENCOUNTER — Other Ambulatory Visit: Payer: Self-pay

## 2019-10-22 ENCOUNTER — Encounter: Payer: Self-pay | Admitting: Internal Medicine

## 2019-10-22 VITALS — BP 146/70 | HR 82 | Temp 98.5°F | Resp 16 | Ht 69.0 in | Wt 291.0 lb

## 2019-10-22 DIAGNOSIS — I10 Essential (primary) hypertension: Secondary | ICD-10-CM

## 2019-10-22 DIAGNOSIS — R69 Illness, unspecified: Secondary | ICD-10-CM | POA: Diagnosis not present

## 2019-10-22 DIAGNOSIS — E039 Hypothyroidism, unspecified: Secondary | ICD-10-CM | POA: Diagnosis not present

## 2019-10-22 DIAGNOSIS — E782 Mixed hyperlipidemia: Secondary | ICD-10-CM

## 2019-10-22 DIAGNOSIS — E1042 Type 1 diabetes mellitus with diabetic polyneuropathy: Secondary | ICD-10-CM

## 2019-10-22 DIAGNOSIS — F32A Depression, unspecified: Secondary | ICD-10-CM

## 2019-10-22 DIAGNOSIS — F4321 Adjustment disorder with depressed mood: Secondary | ICD-10-CM

## 2019-10-22 DIAGNOSIS — F329 Major depressive disorder, single episode, unspecified: Secondary | ICD-10-CM

## 2019-10-22 DIAGNOSIS — F411 Generalized anxiety disorder: Secondary | ICD-10-CM

## 2019-10-22 LAB — CBC WITH DIFFERENTIAL/PLATELET
Basophils Absolute: 0.1 10*3/uL (ref 0.0–0.1)
Basophils Relative: 0.7 % (ref 0.0–3.0)
Eosinophils Absolute: 0.2 10*3/uL (ref 0.0–0.7)
Eosinophils Relative: 2.7 % (ref 0.0–5.0)
HCT: 43.1 % (ref 36.0–46.0)
Hemoglobin: 13.7 g/dL (ref 12.0–15.0)
Lymphocytes Relative: 24.4 % (ref 12.0–46.0)
Lymphs Abs: 1.9 10*3/uL (ref 0.7–4.0)
MCHC: 31.9 g/dL (ref 30.0–36.0)
MCV: 78.4 fl (ref 78.0–100.0)
Monocytes Absolute: 0.4 10*3/uL (ref 0.1–1.0)
Monocytes Relative: 5.4 % (ref 3.0–12.0)
Neutro Abs: 5.2 10*3/uL (ref 1.4–7.7)
Neutrophils Relative %: 66.8 % (ref 43.0–77.0)
Platelets: 144 10*3/uL — ABNORMAL LOW (ref 150.0–400.0)
RBC: 5.5 Mil/uL — ABNORMAL HIGH (ref 3.87–5.11)
RDW: 16.8 % — ABNORMAL HIGH (ref 11.5–15.5)
WBC: 7.8 10*3/uL (ref 4.0–10.5)

## 2019-10-22 LAB — COMPREHENSIVE METABOLIC PANEL
ALT: 18 U/L (ref 0–35)
AST: 26 U/L (ref 0–37)
Albumin: 4.2 g/dL (ref 3.5–5.2)
Alkaline Phosphatase: 114 U/L (ref 39–117)
BUN: 18 mg/dL (ref 6–23)
CO2: 33 mEq/L — ABNORMAL HIGH (ref 19–32)
Calcium: 10.2 mg/dL (ref 8.4–10.5)
Chloride: 94 mEq/L — ABNORMAL LOW (ref 96–112)
Creatinine, Ser: 0.98 mg/dL (ref 0.40–1.20)
GFR: 57.25 mL/min — ABNORMAL LOW (ref >60.00)
Glucose, Bld: 246 mg/dL — ABNORMAL HIGH (ref 70–99)
Potassium: 4.8 mEq/L (ref 3.5–5.1)
Sodium: 131 mEq/L — ABNORMAL LOW (ref 135–145)
Total Bilirubin: 0.7 mg/dL (ref 0.2–1.2)
Total Protein: 8 g/dL (ref 6.0–8.3)

## 2019-10-22 LAB — LIPID PANEL
Cholesterol: 187 mg/dL (ref 0–200)
HDL: 37.1 mg/dL — ABNORMAL LOW (ref >39.00)
LDL Cholesterol: 114 mg/dL — ABNORMAL HIGH (ref 0–99)
NonHDL: 150.09
Total CHOL/HDL Ratio: 5
Triglycerides: 179 mg/dL — ABNORMAL HIGH (ref 0.0–149.0)
VLDL: 35.8 mg/dL (ref 0.0–40.0)

## 2019-10-22 LAB — TSH: TSH: 1.5 u[IU]/mL (ref 0.35–4.50)

## 2019-10-22 LAB — HEMOGLOBIN A1C: Hgb A1c MFr Bld: 11 % — ABNORMAL HIGH (ref 4.6–6.5)

## 2019-10-22 MED ORDER — "INSULIN SYRINGE-NEEDLE U-100 26G X 1/2"" 1 ML MISC": 3 refills | Status: AC

## 2019-10-22 MED ORDER — INSULIN GLARGINE 100 UNIT/ML ~~LOC~~ SOLN: 180.0000 [IU] | Freq: Every day | SUBCUTANEOUS | 11 refills | Status: DC

## 2019-10-22 NOTE — Assessment & Plan Note (Signed)
Chronic Check lipid panel  Not on a statin due to cirrhosis Regular exercise and healthy diet encouraged

## 2019-10-22 NOTE — Assessment & Plan Note (Addendum)
Chronic BP fairly controlled Current regimen effective and well tolerated Continue current medications at current doses cmp

## 2019-10-22 NOTE — Assessment & Plan Note (Signed)
Chronic  Clinically euthyroid Check tsh  Titrate med dose if needed  

## 2019-10-23 NOTE — Assessment & Plan Note (Signed)
Chronic Improved Management per psychiatry

## 2019-10-23 NOTE — Assessment & Plan Note (Signed)
Chronic Not seeing endo Taking insulin, but has to buy or get it from friends Not eating as well as she should Check a1c Will see about getting assistance for insulin - will ask our pharmacist to help Should ideally be seeing endocrine -stressed getting sugars controlled

## 2019-10-23 NOTE — Assessment & Plan Note (Signed)
With anxiety and depression - related to grandson's death Improved Management per psychiatry

## 2019-10-24 ENCOUNTER — Encounter: Payer: Self-pay | Admitting: Internal Medicine

## 2019-10-25 ENCOUNTER — Telehealth: Payer: Self-pay | Admitting: Internal Medicine

## 2019-10-25 NOTE — Chronic Care Management (AMB) (Signed)
  Chronic Care Management   Note  10/25/2019 Name: [name] MRN: [id number] DOB: [birth year]  Quinlyn Tep is a 64 y.o. year old female who is a primary care patient of Burns, Claudina Lick, MD. I reached out to [name] by phone today in response to a referral sent by Ms. Hughie Closs Thomas's PCP, Binnie Rail, MD.   [name] was given information about Chronic Care Management services today including:  1. CCM service includes personalized support from designated clinical staff supervised by her physician, including individualized plan of care and coordination with other care providers 2. 24/7 contact phone numbers for assistance for urgent and routine care needs. 3. Service will only be billed when office clinical staff spend 20 minutes or more in a month to coordinate care. 4. Only one practitioner may furnish and bill the service in a calendar month. 5. The patient may stop CCM services at any time (effective at the end of the month) by phone call to the office staff. 6. The patient will be responsible for cost sharing (co-pay) of up to 20% of the service fee (after annual deductible is met).  Patient agreed to services and verbal consent obtained.   Follow up plan:   Raynicia Dukes [employer] Scheduler

## 2019-10-26 DIAGNOSIS — M1711 Unilateral primary osteoarthritis, right knee: Secondary | ICD-10-CM | POA: Diagnosis not present

## 2019-10-26 DIAGNOSIS — M25561 Pain in right knee: Secondary | ICD-10-CM | POA: Diagnosis not present

## 2019-10-28 DIAGNOSIS — M1711 Unilateral primary osteoarthritis, right knee: Secondary | ICD-10-CM | POA: Insufficient documentation

## 2019-11-02 DIAGNOSIS — M1711 Unilateral primary osteoarthritis, right knee: Secondary | ICD-10-CM | POA: Diagnosis not present

## 2019-11-04 DIAGNOSIS — H35033 Hypertensive retinopathy, bilateral: Secondary | ICD-10-CM | POA: Diagnosis not present

## 2019-11-04 DIAGNOSIS — H35423 Microcystoid degeneration of retina, bilateral: Secondary | ICD-10-CM | POA: Diagnosis not present

## 2019-11-04 DIAGNOSIS — H43813 Vitreous degeneration, bilateral: Secondary | ICD-10-CM | POA: Diagnosis not present

## 2019-11-04 DIAGNOSIS — H353231 Exudative age-related macular degeneration, bilateral, with active choroidal neovascularization: Secondary | ICD-10-CM | POA: Diagnosis not present

## 2019-11-08 ENCOUNTER — Other Ambulatory Visit: Payer: Self-pay

## 2019-11-08 ENCOUNTER — Ambulatory Visit: Payer: Medicare HMO | Admitting: Pharmacist

## 2019-11-08 DIAGNOSIS — F329 Major depressive disorder, single episode, unspecified: Secondary | ICD-10-CM

## 2019-11-08 DIAGNOSIS — F32A Depression, unspecified: Secondary | ICD-10-CM

## 2019-11-08 DIAGNOSIS — E1165 Type 2 diabetes mellitus with hyperglycemia: Secondary | ICD-10-CM

## 2019-11-08 DIAGNOSIS — I10 Essential (primary) hypertension: Secondary | ICD-10-CM

## 2019-11-08 DIAGNOSIS — IMO0002 Reserved for concepts with insufficient information to code with codable children: Secondary | ICD-10-CM

## 2019-11-08 DIAGNOSIS — I5032 Chronic diastolic (congestive) heart failure: Secondary | ICD-10-CM

## 2019-11-08 NOTE — Progress Notes (Signed)
  I have reviewed this encounter including the documentation in this note and/or discussed this patient with the care management provider. I am certifying that I agree with the content of this note as supervising physician.  Binnie Rail, MD   11/08/2019

## 2019-11-08 NOTE — Chronic Care Management (AMB) (Signed)
Chronic Care Management Pharmacy  Name: Davian Garciarodrigue  MRN: [id number] DOB: 1956/01/28  Chief Complaint/ HPI  [name],  64 y.o. , female presents for their Initial CCM visit with the clinical pharmacist via telephone due to COVID-19 Pandemic.  PCP : Binnie Rail, MD  Their chronic conditions include: HTN, CHF, HLD OSA, T2DM, asthma/chronic respiratory failure, NASH, hypothyroidism, neuropathy, hx thyroid cancer, anxiety, fibromyalgia  Office Visits: 10/22/19 Dr Quay Burow OV: anxiety/depression related to grandson's death, managed per psych. DM uncontrolled d/t inability to afford insulin. Changed to Lantus.  06/16/19 Dr Quay Burow VV: sugars better controlled, mgmt per Dr Loanne Drilling. Struggling with grief, talking to therapist and meds managed per psych.  Consult Visit: 08/10/19 Dr Loanne Drilling (endocrine): change Lantus to Toujeo 180 units qAM and apply for PAP. Check BG BID  07/06/19-07/07/19 Hospital admission: atypical chest/back pain, resolved spontaneously. Rec' close f/u with PCP and consider increasing diuretic doses.   06/23/19 Dr Rosario Jacks (GI): liver lesion on [place], too small for characterization. Cirrhosis, splenomegaly stable, No med changes.  05/17/19 NP Cleaver (cardiology)   Medications: Outpatient Encounter Medications as of 11/08/2019  Medication Sig  . acetaminophen (TYLENOL) 500 MG tablet Take 1,000 mg by mouth 2 (two) times daily.  Marland Kitchen albuterol (VENTOLIN HFA) 108 (90 Base) MCG/ACT inhaler TAKE 2 PUFFS BY MOUTH EVERY 6 HOURS AS NEEDED FOR WHEEZE OR SHORTNESS OF BREATH (Patient taking differently: Inhale 2 puffs into the lungs every 6 (six) hours as needed for wheezing or shortness of breath. )  . amitriptyline (ELAVIL) 100 MG tablet Take 100 mg by mouth at bedtime.  Marland Kitchen amLODipine (NORVASC) 5 MG tablet TAKE 1 TABLET BY MOUTH TWICE A DAY  . aspirin EC 81 MG tablet Take 1 tablet (81 mg total) by mouth daily.  . celecoxib (CELEBREX) 100 MG capsule celecoxib 100 mg  capsule  . furosemide (LASIX) 20 MG tablet TAKE 1 TABLET BY MOUTH EVERY DAY (Patient taking differently: Take 20 mg by mouth daily. )  . hydrALAZINE (APRESOLINE) 100 MG tablet Take 1 tablet (100 mg total) by mouth 3 (three) times daily. (Patient taking differently: Take 100 mg by mouth 2 (two) times daily. )  . hydrALAZINE (APRESOLINE) 25 MG tablet TAKE 1 TABLET BY MOUTH THREE TIMES A DAY  . insulin glargine (LANTUS) 100 UNIT/ML injection Inject 1.8 mLs (180 Units total) into the skin daily. (Patient taking differently: Inject 160 Units into the skin daily. )  . Insulin Pen Needle (PEN NEEDLES) 31G X 8 MM MISC 1 Device by Does not apply route daily.  . Insulin Syringe-Needle U-100 26G X 1/2" 1 ML MISC Use daily for insulin injection as directed  . ipratropium-albuterol (DUONEB) 0.5-2.5 (3) MG/3ML SOLN Take 3 mLs by nebulization every 6 (six) hours as needed (Wheezing or dyspnea.).  Marland Kitchen levothyroxine (SYNTHROID) 175 MCG tablet TAKE 1 TABLET BY MOUTH SIX DAYS A WEEK BEFORE BREAKFAST (Patient taking differently: Take 175 mcg by mouth daily before breakfast. )  . metoprolol tartrate (LOPRESSOR) 25 MG tablet Take 1 tablet (25 mg total) by mouth 2 (two) times daily.  . mirtazapine (REMERON) 45 MG tablet mirtazapine 45 mg tablet  TAKE 1 TABLET BY MOUTH EVERYDAY AT BEDTIME  . morphine (MS CONTIN) 30 MG 12 hr tablet Take 30 mg by mouth 2 (two) times daily.  Marland Kitchen PARoxetine (PAXIL) 20 MG tablet paroxetine 20 mg tablet  TAKE 1 TABLET BY MOUTH EVERY DAY  . spironolactone (ALDACTONE) 25 MG tablet TAKE 1 TABLET (25  MG TOTAL) BY MOUTH DAILY. CAUSES PAIN ONLY TAKES WHEN BP IS ELEVATED  . tiZANidine (ZANAFLEX) 2 MG tablet Take 2 mg by mouth as needed for muscle spasms.   Marland Kitchen gabapentin (NEURONTIN) 300 MG capsule Take 300 mg by mouth 3 (three) times daily.  . ondansetron (ZOFRAN-ODT) 4 MG disintegrating tablet Take 1 tablet (4 mg total) by mouth every 6 (six) hours as needed for nausea or vomiting. (Patient not taking:  Reported on 11/08/2019)  . potassium chloride SA (KLOR-CON M20) 20 MEQ tablet TAKE 2 TABLETS EVERY DAY AS NEEDED FOR CRAMPING (Patient not taking: Reported on 11/08/2019)   No facility-administered encounter medications on file as of 11/08/2019.     Current Diagnosis/Assessment:  Goals Addressed            This Visit's Progress   . Pharmacy Care Plan       CARE PLAN ENTRY  Current Barriers:  . Chronic Disease Management support, education, and care coordination needs related to HTN, HLD, and DMII  Pharmacist Clinical Goal(s):  Marland Kitchen Maintain fasting BG < 150 . Ensure safety, efficacy, and affordability of medications  Interventions: . Comprehensive medication review performed. Marland Kitchen Split insulin dose from 160 units once daily to 75 units twice a day due to absorption issues . Pursue patient assistance for Basaglar insulin . Check fasting BG daily  Patient Self Care Activities:  . Self administers medications as prescribed, Calls pharmacy for medication refills, and Calls provider office for new concerns or questions  Initial goal documentation       Diabetes   Recent Relevant Labs: Lab Results  Component Value Date/Time   HGBA1C 11.0 (H) 10/22/2019 02:11 PM   HGBA1C 9.4 (H) 07/20/2019 01:51 PM   MICROALBUR <0.7 09/19/2017 02:26 PM   MICROALBUR 0.8 02/05/2017 11:09 AM     Checking BG: Rarely  Patient has failed these meds in past: Toujeo, NPH, Basaglar Patient is currently uncontrolled on the following medications: Lantus 160 units daily  Last diabetic Foot exam:  Lab Results  Component Value Date/Time   HMDIABEYEEXA No Retinopathy 05/29/2016 12:00 AM    Last diabetic Eye exam: No results found for: HMDIABFOOTEX   We discussed: pt cannot afford Lantus, she is using family/friend's extra Lantus now. I checked insurance formulary and it is not listed. Basaglar is preferred by insurance, however pt spoke with insurance and since she is on such high dose insulin she  will enter coverage gap very early in the year. We will pursue patient assistance for Basaglar since manufacturer does not require an out of pocket minimum for approval. Even if pt is denied PAP, she can pursue [employer] DM solutions and obtain Basaglar for $35/month.   Also, pt reports she is not checking BG. She reports she was told she does not need to since she is injecting once a day only. She is also injecting 160 units of Lantus right now, less than 180 prescribed, due to fear of hypoglycemia. She injects the dose in 2 syringes at 2 different sites at the same time.  Discussed importance of checking fasting BG to assess efficacy/safety of basal insulin dose, and discussed benefits of splitting insulin dose in AM and PM dosing.   Plan  Recommend splitting insulin dose to 75 units AM and PM Check fasting BG daily Monitor for s/sx of hypoglycemia Pursue patient assistance for Basaglar   Hypertension/diastolic HF   Type: Diastolic  Last ejection fraction: 60-65% (06/2019)  Office blood pressures are  BP Readings from  Last 3 Encounters:  10/22/19 (!) 146/70  07/20/19 (!) 154/80  07/07/19 (!) 144/62   Lab Results  Component Value Date   CREATININE 0.98 10/22/2019   CREATININE 0.87 07/20/2019   CREATININE 0.89 07/06/2019    Patient has failed these meds in the past: HCTZ, valsartan, losartan, lisinopril Patient is currently uncontrolled on the following medications: amlodipine 5 mg daily, furosemide 20 mg daily, spironolactone 25 mg daily, hydralazine 25 mg TID, metoprolol tartrate 25 mg BID, potassium 40 mEq daily prn (cramping)  Patient checks BP at home: rarely  Patient home BP readings are ranging: n/a  We discussed pt is not taking all meds as prescribed. She admits to a habit of "stretching" her meds to put off refilling them. Most of her BP meds are covered by insurance with $0-5 copays, emphasized importance of compliance for preventing more costly complications in the  future.   Plan  Continue current medications    Hyperlipidemia   Lipid Panel     Component Value Date/Time   CHOL 187 10/22/2019 1411   TRIG 179.0 (H) 10/22/2019 1411   HDL 37.10 (L) 10/22/2019 1411   CHOLHDL 5 10/22/2019 1411   VLDL 35.8 10/22/2019 1411   LDLCALC 114 (H) 10/22/2019 1411    ASCVD 10-year risk: 17%  Patient has failed these meds in past: statins Patient is currently uncontrolled on the following medications: no statin d/t cirrhosis per PCP, aspirin 81 mg daily  We discussed:  Ran out of time to discuss decision to stop statin d/t cirrhosis, plan to discuss at follow up.  Plan  Continue current medications    COPD / Asthma / Tobacco   Last spirometry score: 04/02/2018: FEV1 74% predicted, FEV1/FVC 0.82  Eosinophil count:   Lab Results  Component Value Date/Time   EOSPCT 2.7 10/22/2019 02:11 PM  %                               Eos (Absolute):  Lab Results  Component Value Date/Time   EOSABS 0.2 10/22/2019 02:11 PM    Tobacco Status:  Social History   Tobacco Use  Smoking Status Former Smoker  . Packs/day: 2.50  . Years: 39.00  . Pack years: 97.50  . Types: Cigarettes  . Quit date: 10/22/2010  . Years since quitting: 9.0  Smokeless Tobacco Never Used    Patient has failed these meds in past: Spriva Patient is currently controlled on the following medications: Duoneb q6h prn, albuterol HFA prn  Using maintenance inhaler regularly? No Frequency of rescue inhaler use:  prn  We discussed:  Pt does not endorse issues with inhalers  Plan  Continue current medications  Hypothyroidism   TSH  Date Value Ref Range Status  10/22/2019 1.50 0.35 - 4.50 uIU/mL Final     Patient has failed these meds in past: n/a Patient is currently controlled on the following medications: levothyroxine 175 mcg daily  We discussed:  Pt did not endorse s/sx hypothyroidism  Plan  Continue current medications   Depression/anxiety   Patient has failed  these meds in past: sertraline, Pristiq, buspirone. Hydroxyzine, alprazolam, duloxetine, diazepam Patient is currently controlled on the following medications: paroxetine 20 mg, mirtazapine 45 mg HS, amitriptyline 100 mg HS  We discussed: Pt is grieving after losing young grandson last May. She endorses meds are working well for her.  Amitriptyline works for depression and bladder spasms, she has tried multiple drugs before and this  one is only one that helps. Her insurance formulary lists amitriptyline as brand so copay is $47/month. Will try for tier exception.  Plan  Continue current medications  Pursue tier exception for amitriptyline   Chronic pain/fibromyalgia   Patient has failed these meds in past: gabapentin, meloxicam Patient is currently uncontrolled on the following medications: morphine ER 30 mg q12h, celecoxib 100 mg BID, tylenol 1000 mg BID, tizanidine 2 mg PRN  We discussed: pt is trying to wean off of morphine, using NSAIDs to help with pain. Meloxicam stopped working as well so switched to celebrex, however celebrex is also preferred brand with insurance and $47/month. Pt is not taking gabapentin due to cost ($141/3 months) and swelling.  Plan  Continue current medications  Pursue tier exception for Celebrex  Medication Management   Pt uses [facility] for all medications  We discussed:  Pt endorses noncompliance with some expensive meds to delay need to refill. Will pursue patient assistance and formulary exceptions to reduce cost and encourage compliance.  Plan  Continue current medications     Follow up: 1 month phone visit  Charlene Brooke, PharmD Clinical Pharmacist [facility] at [facility] [phone]

## 2019-11-08 NOTE — Patient Instructions (Addendum)
Visit Information  Thank you for meeting with me to discuss your medications! I look forward to working with you to achieve your health care goals. Below is a summary of what we talked about during the visit:  Goals Addressed            This Visit's Progress   . Pharmacy Care Plan       CARE PLAN ENTRY  Current Barriers:  . Chronic Disease Management support, education, and care coordination needs related to HTN, HLD, and DMII  Pharmacist Clinical Goal(s):  Marland Kitchen Maintain fasting BG < 150 . Ensure safety, efficacy, and affordability of medications  Interventions: . Comprehensive medication review performed. Marland Kitchen Split insulin dose from 160 units once daily to 75 units twice a day due to absorption issues . Pursue patient assistance for Basaglar insulin . Check fasting BG daily  Patient Self Care Activities:  . Self administers medications as prescribed, Calls pharmacy for medication refills, and Calls provider office for new concerns or questions  Initial goal documentation       Mary Acosta was given information about Chronic Care Management services today including:  1. CCM service includes personalized support from designated clinical staff supervised by her physician, including individualized plan of care and coordination with other care providers 2. 24/7 contact phone numbers for assistance for urgent and routine care needs. 3. Standard insurance, coinsurance, copays and deductibles apply for chronic care management only during months in which we provide at least 20 minutes of these services. Most insurances cover these services at 100%, however patients may be responsible for any copay, coinsurance and/or deductible if applicable. This service may help you avoid the need for more expensive face-to-face services. 4. Only one practitioner may furnish and bill the service in a calendar month. 5. The patient may stop CCM services at any time (effective at the end of the month)  by phone call to the office staff.  Patient agreed to services and verbal consent obtained.   The patient verbalized understanding of instructions provided today and agreed to receive a mailed copy of patient instruction and/or educational materials. Telephone follow up appointment with pharmacy team member scheduled for: 12/08/19 @ 1:00 pm   Charlene Brooke, PharmD Clinical Pharmacist [facility] at [facility] [phone]  Insulin Treatment for Diabetes Mellitus Diabetes (diabetes mellitus) is a long-term (chronic) disease. It occurs when the body does not properly use sugar (glucose) that is released from food after digestion. Glucose levels are controlled by a hormone called insulin. Insulin is made in the pancreas, which is an organ behind the stomach.  If you have type 1 diabetes, you must take insulin because your pancreas does not make any.  If you have type 2 diabetes, you might need to take insulin along with other medicines. In type 2 diabetes, one or both of these problems may be present: ? The pancreas does not make enough insulin. ? Cells in the body do not respond properly to insulin that the body makes (insulin resistance). You must use insulin correctly to control your diabetes. You must have some insulin in your body at all times. Insulin treatment varies depending on your type of diabetes, your treatment goals, and your medical history. Ask questions to understand your insulin treatment plan so you can be an active partner in managing your diabetes. How is insulin given? Insulin can only be given through a shot (injection). It is injected using a syringe and needle, an insulin pen, a  pump, or a jet injector. Your health care provider will:  Prescribe the type and amount of insulin that you need.  Tell you when you should inject your insulin. Where on the body should insulin be injected? Insulin is injected into a layer of fatty tissue under the skin. Good  places to inject insulin include:  Abdomen. Generally, the abdomen is the best place to inject insulin. However, you should avoid any area that is less than 2 inches (5 cm) from the belly button (navel).  Front of thigh.  Upper, outer side of thigh.  Upper, outer side of arm.  Upper, outer part of buttock. It is important to:  Give your injection in a slightly different place each time. This helps to prevent irritation and improve absorption.  Avoid injecting into areas that have scar tissue. Usually, you will give yourself insulin injections. Others can also be taught how to give you injections. You will use a special type of syringe that is made only for insulin. Some people may have an insulin pump that delivers insulin steadily through a tube (cannula) that is placed under the skin. What are the different types of insulin? The following information is a general guide to different types of insulin. Specifics vary depending on the insulin product that your health care provider prescribes.  Rapid-acting insulin: ? Starts working quickly, in as little as 5 minutes. ? Can last for 4-6 hours (or sometimes longer). ? Works well when taken right before a meal to quickly lower your blood glucose.  Short-acting insulin: ? Starts working in about 30 minutes. ? Can last for 6-10 hours. ? Should be taken about 30 minutes before you start eating a meal.  Intermediate-acting insulin: ? Starts working in 1-2 hours. ? Lasts for about 10-18 hours. ? Lowers your blood glucose for a longer period of time, but it is not as effective for lowering blood glucose right after a meal.  Long-acting insulin: ? Mimics the small amount of insulin that your pancreas usually produces throughout the day. ? Should be used one or two times a day. ? Is usually used in combination with other types of insulin or other medicines.  Concentrated insulin, or U-500 insulin: ? Contains a higher dose of insulin than  most rapid-acting insulins. U-500 insulin has 5 times the amount of insulin per 1 mL. ? Should only be used with the special U-500 syringe or U-500 insulin pen. It is dangerous to use the wrong type of syringe with this insulin. What are the side effects of insulin? Possible side effects of insulin treatment include:  Low blood glucose (hypoglycemia).  Weight gain.  High blood glucose (hyperglycemia).  Skin injury or irritation. Some of these side effects can be caused by using improper injection technique. Be sure to learn how to inject insulin properly. What are common terms associated with insulin treatment? Some terms that you might hear include:  Basal insulin, or basal rate. This is the constant amount of insulin that needs to be present in your body to keep your blood glucose levels stable. People who have type 1 diabetes need basal insulin in a steady (continuous) dose 24 hours a day. ? Usually, intermediate-acting or long-acting insulin is used one or two times a day to manage basal insulin levels. ? Medicines that are taken by mouth may also be recommended to manage basal insulin levels.  Prandial or nutrition insulin. This refers to meal-related insulin. ? Blood glucose rises quickly after a meal (postprandial).  Rapid-acting or short-acting insulin can be used right before a meal (preprandial) to quickly lower your blood glucose. ? You may be instructed to adjust the amount of prandial insulin that you take, based on how much carbohydrate (starch) is in your meal.  Corrective insulin. This may also be called a correction dose or supplemental dose. This is a small amount of rapid-acting or short-acting insulin that can be used to lower your blood glucose if it is too high. You may be instructed to check your blood glucose at certain times of the day and use corrective insulin as needed.  Tight control, or intensive therapy. This means keeping your blood glucose as close to your  target as possible, and preventing your blood glucose from getting too high after meals. People who have tight control of their diabetes have fewer long-term problems caused by diabetes. Follow these instructions at home: Talk with your health care provider or pharmacist about the type of insulin you should take and when you should take it. You should know when your insulin goes up the most (peaks) and when it wears off. You need this information so you can plan your meals and exercise. Work with your health care provider to:  Check your blood glucose every day. Your health care provider will tell you how often and when you should do this.  Manage your: ? Weight. ? Blood pressure. ? Cholesterol. ? Stress.  Eat a healthy diet.  Exercise regularly. Summary  Diabetes is a long-term (chronic) disease. It occurs when the body does not properly use sugar (glucose) that is released from food after digestion. Glucose levels are controlled by a hormone called insulin, which is made in an organ behind your stomach (pancreas).  You must use insulin correctly to control your diabetes. You must have some insulin in your body at all times.  Insulin treatment varies depending on your type of diabetes, your treatment goals, and your medical history.  Talk with your health care provider or pharmacist about the type of insulin you should take and when you should take it.  Check your blood glucose every day. Your health care provider will tell you how often and when you should check it. This information is not intended to replace advice given to you by your health care provider. Make sure you discuss any questions you have with your health care provider. Document Revised: 08/15/2017 Document Reviewed: 09/15/2015 Elsevier Patient Education  2020 [employer].

## 2019-11-10 DIAGNOSIS — J449 Chronic obstructive pulmonary disease, unspecified: Secondary | ICD-10-CM | POA: Diagnosis not present

## 2019-11-10 NOTE — Progress Notes (Addendum)
Contacted patient's insurance company for tier exception of amitriptyline and celecoxib, currently Tier 3 and $47/month each. Spoke with clinical pharmacist at [facility] and tier exceptions were approved - amitriptyline and celecoxib now Tier 1 ($0) through 08/25/20.   Spent 55 minutes coordinating care with insurance company.   Charlene Brooke, PharmD 11/10/19 2:50 PM

## 2019-11-11 DIAGNOSIS — J449 Chronic obstructive pulmonary disease, unspecified: Secondary | ICD-10-CM | POA: Diagnosis not present

## 2019-11-11 NOTE — Addendum Note (Signed)
Addended by: Aviva Signs M on: 11/11/2019 10:10 AM   Modules accepted: Orders

## 2019-11-25 ENCOUNTER — Other Ambulatory Visit: Payer: Self-pay | Admitting: Physician Assistant

## 2019-11-25 NOTE — Telephone Encounter (Signed)
This is Dr. Crenshaw's pt 

## 2019-11-28 ENCOUNTER — Other Ambulatory Visit: Payer: Self-pay | Admitting: Internal Medicine

## 2019-11-30 ENCOUNTER — Other Ambulatory Visit: Payer: Self-pay | Admitting: Internal Medicine

## 2019-11-30 ENCOUNTER — Encounter: Payer: Self-pay | Admitting: Internal Medicine

## 2019-11-30 DIAGNOSIS — Z1231 Encounter for screening mammogram for malignant neoplasm of breast: Secondary | ICD-10-CM

## 2019-12-03 ENCOUNTER — Ambulatory Visit
Admission: RE | Admit: 2019-12-03 | Discharge: 2019-12-03 | Disposition: A | Discharge status: Home / Self Care | Payer: Medicare HMO | Source: Ambulatory Visit | Attending: Internal Medicine | Admitting: Internal Medicine

## 2019-12-03 ENCOUNTER — Other Ambulatory Visit: Payer: Self-pay

## 2019-12-03 DIAGNOSIS — Z1231 Encounter for screening mammogram for malignant neoplasm of breast: Secondary | ICD-10-CM

## 2019-12-07 DIAGNOSIS — J449 Chronic obstructive pulmonary disease, unspecified: Secondary | ICD-10-CM | POA: Diagnosis not present

## 2019-12-07 NOTE — Progress Notes (Signed)
Cardiology Clinic Note   Patient Name: Mary Acosta Date of Encounter: 12/09/2019  Primary Care Provider:  Binnie Rail, MD Primary Cardiologist:  Kirk Ruths, MD  Patient Profile    Mary Acosta. Kiaraliz Trathen 64 year old female presents today for follow-up evaluation of her essential hypertension, chronic diastolic heart failure, obstructive sleep apnea, palpitations, and HLD.  Past Medical History    Past Medical History:  Diagnosis Date  . AKI (acute kidney injury) ([place]) 01/2017  . Anxiety    with panic attacks  . Arthritis    "back; feet; hands; shoulders" (08/26/2014)  . Asthma   . Cervical cancer ([place])   . Chronic lower back pain   . Chronic narcotic use   . Chronic pain syndrome    [facility]  . Cirrhosis ([place])   . Clostridium difficile infection 2017  . COPD (chronic obstructive pulmonary disease) ([place])   . Daily headache   . Depression   . Diabetic neuropathy ([place]) 06/04/2017  . DJD (degenerative joint disease)   . Fatty liver disease, nonalcoholic   . Fibromyalgia   . Frequency of urination   . HCAP (healthcare-associated pneumonia) 08/26/2014  . History of TIA (transient ischemic attack) 11-01-2010   NO RESIDUAL  . Hyperlipidemia   . Hypertension   . Hypothyroidism   . IDDM (insulin dependent diabetes mellitus)   . Insomnia   . Lumbar stenosis   . Memory difficulty 07/25/2016  . Nocturia   . OSA (obstructive sleep apnea)    NO CPAP SINCE WT LOSS  . Osteoarthritis    with severe disease in knee  . Pneumonia "several times"  . Polymyalgia rheumatica ([place])   . Scoliosis   . Seasonal allergies   . Thyroid cancer ([place])   . Urgency of urination   . Vaginal pain S/P SLING  FEB 2012   Past Surgical History:  Procedure Laterality Date  . APPENDECTOMY  1982  . BREAST EXCISIONAL BIOPSY Left 02/28/2005   Atypical Ductal Hyperplasia  . CARDIAC CATHETERIZATION  09-04-2004   NORMAL CORONARY ANATOMY/ NORMAL LVF/ EF 60%  .  CARDIOVASCULAR STRESS TEST  12-27-2010  DR [place]   ABNORMAL NUCLEAR STUDY W/ /MILD INFERIOR ISCHEMIA/ EF 69%/  CT HEART ANGIOGRAM ;  NO ACUTE FINDINGS  . CRYOABLATION  05/16/2003   w/LEEP FOR ABNORMAL PAP SMEAR  . CYSTOSCOPY  05/18/2012   Procedure: CYSTOSCOPY;  Surgeon: Reece Packer, MD;  Location: [facility];  Service: Urology;  Laterality: N/A;  examination under anethesia  . ESOPHAGOGASTRODUODENOSCOPY (EGD) WITH PROPOFOL N/A 09/03/2016   Procedure: ESOPHAGOGASTRODUODENOSCOPY (EGD) WITH PROPOFOL;  Surgeon: Doran Stabler, MD;  Location: [facility];  Service: Gastroenterology;  Laterality: N/A;  . HYSTEROSCOPY WITH D & C  08-19-2007   PMB  . KNEE ARTHROSCOPY W/ DEBRIDEMENT Left 03/29/2006   INTERNAL DERANGEMENT/ SEVERE DJD/ MENISCUS TEARS  . LAPAROSCOPIC CHOLECYSTECTOMY  06-10-2002  . LAPAROSCOPIC GASTRIC BANDING  03/01/2006   TRUNCAL VAGOTOMY/ PLACEMENT OF VG BAND  . REVISION TOTAL KNEE ARTHROPLASTY Left 08-29-2008; 05/2009  . TONSILLECTOMY  1969  . TOTAL KNEE ARTHROPLASTY Left 01-23-2007   SEVERE DJD  . TOTAL THYROIDECTOMY  11-22-2005   BILATERAL THYROID NODULES-- PAPILLARY CARCINOMA (0.5CM)/ ADENOMATOID NODULES  . TRANSTHORACIC ECHOCARDIOGRAM  12-27-2010   LVSF NORMAL / EF [id number] GRADE I DIASTOLIC DYSFUNCTION/ MILD MITRAL REGURG. / MILDLY DILATED LEFT ATRIUM/ MILDY INCREASED SYSTOLIC PRESSURE OF PULMONARY ARTERIES  . TRANSVAGINAL SUBURETERAL TAPE/ SLING  09-28-2010  MIXED URINARY INCONTINENCE  . TUBAL LIGATION  1983    Allergies  Allergies  Allergen Reactions  . Gabapentin Swelling    Swelling in legs Swelling in legs Swelling in legs  . Losartan Other (See Comments)    Myalgias and muscle cramping Other reaction(s): Other (See Comments) Myalgias and muscle cramping Myalgias and muscle cramping Other reaction(s): Other (See Comments) Myalgias and muscle cramping  . Aricept [Donepezil Hcl]     Nausea/vomiting, low BP  . Oxycodone Itching  .  Sulfa Antibiotics Nausea Only and Rash  . Sulfonamide Derivatives Nausea Only    History of Present Illness    Mary Acosta has a past medical history of dyspnea, fibromyalgia, hyperlipidemia, palpitations, chronic diastolic heart failure, OHS,narcotic overdose, and essential hypertension.  Mary Acosta was last seen by Dr. Stanford Breed on 03/10/2018.  During that time she was doing well.  Her metoprolol had been increased in April for atrial flutter versus SVT.  A cardiac monitor from May 2019 showed sinus to sinus tachycardia with no significant arrhythmia.  She was having mild dyspnea with exertion however, she does not have any orthopnea PND, lower extremity edema, chest pain or syncope.  She was hypertensive with a systolic blood pressure ranging in the 150s.  On on 05/05/2018 she had an acute episode of fluid volume overload and took 20 mg Lasix for 1 week return to the office for a CMP which was stable.  Her last echocardiogram on 04/10/2018 showed an LVEF of 60 to 65% mild LVH, grade 1 diastolic dysfunction and a mildly dilated right ventricle.  She presented to the clinic on 05/17/2019 and stated she experienced an episode of chest pressure while she was in the shower.  She stated the pressure lasted for about 30 seconds and subsided on its own without the use of medication.  She was tearful  stating that her grandson, that they adopted, passed away suddenly a few months ago.  She stated they were still waiting on autopsy results.  However, she believed it was a medication that was prescribed by the pediatrician causing the death.  She stated she was very inactive however she had lost about 3 pounds.  She stated she had tried 4 different masks with her CPAP and had discontinued use because she had not been comfortable with any of the appliances.  She presents the clinic today for follow-up and states she has noticed labile blood pressures.  Her blood pressures are higher in the morning.  She is not able  to use CPAP due to mask not fitting properly.  She states she would like a visit with Dr. Chase Caller to talk about possible surgery involving her airway.  She states she has been minimally active due to her chronic back pain.  However she has been able to decrease the amount of pain medication she is taking.  She has been doing chair exercises a few times a week.  She is still grieving the loss of her son.  She has not been taking atorvastatin due to her liver.  I will add ezetimibe 10 mg daily, lipid panel and LFTs in 8 weeks. follow-up with Dr. Stanford Breed in 6 months.  Today she denies chest pain, increased shortness of breath, lower extremity edema, fatigue, palpitations, melena, hematuria, hemoptysis, diaphoresis, weakness, presyncope, syncope, orthopnea, and PND.  Home Medications    Prior to Admission medications   Medication Sig Start Date End Date Taking? Authorizing Provider  acetaminophen (TYLENOL) 500 MG tablet Take 1,000 mg  by mouth 2 (two) times daily.    [provider]  albuterol (VENTOLIN HFA) 108 (90 Base) MCG/ACT inhaler TAKE 2 PUFFS BY MOUTH EVERY 6 HOURS AS NEEDED FOR WHEEZE OR SHORTNESS OF BREATH Patient taking differently: Inhale 2 puffs into the lungs every 6 (six) hours as needed for wheezing or shortness of breath.  04/07/19   Binnie Rail, MD  amitriptyline (ELAVIL) 100 MG tablet Take 100 mg by mouth at bedtime. 07/01/19   [provider]  amLODipine (NORVASC) 5 MG tablet TAKE 1 TABLET BY MOUTH TWICE A DAY 09/02/19   Binnie Rail, MD  aspirin EC 81 MG tablet Take 1 tablet (81 mg total) by mouth daily. 03/10/18   Lelon Perla, MD  celecoxib (CELEBREX) 100 MG capsule celecoxib 100 mg capsule 10/18/19   [provider]  furosemide (LASIX) 20 MG tablet TAKE 1 TABLET BY MOUTH EVERY DAY Patient taking differently: Take 20 mg by mouth daily.  09/01/18   Lelon Perla, MD  gabapentin (NEURONTIN) 300 MG capsule Take 300 mg by mouth 3 (three) times daily.  06/10/19   [provider]  hydrALAZINE (APRESOLINE) 100 MG tablet Take 1 tablet (100 mg total) by mouth 3 (three) times daily. Patient taking differently: Take 100 mg by mouth 2 (two) times daily.  03/16/19   Binnie Rail, MD  hydrALAZINE (APRESOLINE) 25 MG tablet TAKE 1 TABLET BY MOUTH THREE TIMES A DAY 09/02/19   Burns, Claudina Lick, MD  insulin glargine (LANTUS) 100 UNIT/ML injection Inject 1.8 mLs (180 Units total) into the skin daily. Patient taking differently: Inject 160 Units into the skin daily.  10/22/19   Binnie Rail, MD  Insulin Pen Needle (PEN NEEDLES) 31G X 8 MM MISC 1 Device by Does not apply route daily. 08/10/19   Renato Shin, MD  Insulin Syringe-Needle U-100 26G X 1/2" 1 ML MISC Use daily for insulin injection as directed 10/22/19   Binnie Rail, MD  ipratropium-albuterol (DUONEB) 0.5-2.5 (3) MG/3ML SOLN Take 3 mLs by nebulization every 6 (six) hours as needed (Wheezing or dyspnea.). 02/02/17   Hongalgi, Lenis Dickinson, MD  levothyroxine (SYNTHROID) 175 MCG tablet TAKE 1 TABLET BY MOUTH SIX DAYS A WEEK BEFORE BREAKFAST 11/29/19   Binnie Rail, MD  metoprolol tartrate (LOPRESSOR) 25 MG tablet Take 1 tablet (25 mg total) by mouth 2 (two) times daily. 12/14/18   Barrett, Evelene Croon, PA-C  mirtazapine (REMERON) 45 MG tablet mirtazapine 45 mg tablet  TAKE 1 TABLET BY MOUTH EVERYDAY AT BEDTIME    [provider]  morphine (MS CONTIN) 30 MG 12 hr tablet Take 30 mg by mouth 2 (two) times daily. 09/12/15   [provider]  ondansetron (ZOFRAN-ODT) 4 MG disintegrating tablet Take 1 tablet (4 mg total) by mouth every 6 (six) hours as needed for nausea or vomiting. Patient not taking: Reported on 11/08/2019 08/23/19   Yetta Flock, MD  PARoxetine (PAXIL) 20 MG tablet paroxetine 20 mg tablet  TAKE 1 TABLET BY MOUTH EVERY DAY    [provider]  potassium chloride SA (KLOR-CON M20) 20 MEQ tablet TAKE 2 TABLETS EVERY DAY AS NEEDED FOR CRAMPING Patient not taking:  Reported on 11/08/2019 11/12/17   Binnie Rail, MD  spironolactone (ALDACTONE) 25 MG tablet TAKE 1 TABLET (25 MG TOTAL) BY MOUTH DAILY. CAUSES PAIN ONLY TAKES WHEN BP IS ELEVATED 10/04/19   Binnie Rail, MD  tiZANidine (ZANAFLEX) 2 MG tablet Take 2 mg  by mouth as needed for muscle spasms.  05/30/15   [provider]    Family History    Family History  Problem Relation Age of Onset  . Diabetes Mother   . Heart disease Mother   . Dementia Mother   . Heart disease Father   . High blood pressure Father   . Colon cancer Maternal Uncle        x 2  . Breast cancer Other        great aunts x 5   She indicated that her mother is deceased. She indicated that her father is deceased. She indicated that only one of her two sisters is alive. She indicated that her brother is deceased. She indicated that the status of her maternal uncle is unknown. She indicated that the status of her other is unknown.  Social History    Social History   Socioeconomic History  . Marital status: Married    Spouse name: Not on file  . Number of children: 2  . Years of education: 19  . Highest education level: Not on file  Occupational History  . Occupation: disabled    Fish farm manager: UNEMPLOYED  Tobacco Use  . Smoking status: Former Smoker    Packs/day: 2.50    Years: 39.00    Pack years: 97.50    Types: Cigarettes    Quit date: 10/22/2010    Years since quitting: 9.1  . Smokeless tobacco: Never Used  Substance and Sexual Activity  . Alcohol use: No    Alcohol/week: 0.0 standard drinks  . Drug use: No  . Sexual activity: Not Currently  Other Topics Concern  . Not on file  Social History Narrative   Lives at home w/ her husband and grandson   Right-handed   Caffeine: 1 cup of coffee per week + [employer]   Social Determinants of Health   Financial Resource Strain:   . Difficulty of Paying Living Expenses:   Food Insecurity:   . Worried About Charity fundraiser in the Last Year:   . Youth worker in the Last Year:   Transportation Needs:   . Film/video editor (Medical):   Marland Kitchen Lack of Transportation (Non-Medical):   Physical Activity:   . Days of Exercise per Week:   . Minutes of Exercise per Session:   Stress:   . Feeling of Stress :   Social Connections:   . Frequency of Communication with Friends and Family:   . Frequency of Social Gatherings with Friends and Family:   . Attends Religious Services:   . Active Member of Clubs or Organizations:   . Attends Archivist Meetings:   Marland Kitchen Marital Status:   Intimate Partner Violence: Not At Risk  . Fear of Current or Ex-Partner: No  . Emotionally Abused: No  . Physically Abused: No  . Sexually Abused: No     Review of Systems    General:  No chills, fever, night sweats or weight changes.  Cardiovascular:  No chest pain, dyspnea on exertion, edema, orthopnea, palpitations, paroxysmal nocturnal dyspnea. Dermatological: No rash, lesions/masses Respiratory: No cough, dyspnea Urologic: No hematuria, dysuria Abdominal:   No nausea, vomiting, diarrhea, bright red blood per rectum, melena, or hematemesis Neurologic:  No visual changes, wkns, changes in mental status. All other systems reviewed and are otherwise negative except as noted above.  Physical Exam    VS:  BP (!) 90/50 (BP Location: Left Arm, Patient Position: Sitting, Cuff Size:  Large)   Pulse 70   Temp (!) 95.4 F (35.2 C)   Ht 5\' 9"  (1.753 m)   Wt 298 lb (135.2 kg)   BMI 44.01 kg/m  , BMI Body mass index is 44.01 kg/m. GEN: Well nourished, well developed, in no acute distress. HEENT: normal. Neck: Supple, no JVD, carotid bruits, or masses. Cardiac: RRR, no murmurs, rubs, or gallops. No clubbing, cyanosis, edema.  Radials/DP/PT 2+ and equal bilaterally.  Respiratory:  Respirations regular and unlabored, clear to auscultation bilaterally. GI: Soft, nontender, nondistended, BS + x 4. MS: no deformity or atrophy. Skin: warm and dry, no  rash. Neuro:  Strength and sensation are intact. Psych: Normal affect.  Accessory Clinical Findings    ECG personally reviewed by me today-none today.  EKG 05/17/2019  sinus rhythm with first-degree AV block 78 bpm  EKG 12/11/2017 Normal sinus rhythm 66 bpm  Echocardiogram 04/10/2018 Study Conclusions  - Left ventricle: The cavity size was normal. Wall thickness was increased in a pattern of mild LVH. Systolic function was normal. The estimated ejection fraction was in the range of 60% to 65%. Wall motion was normal; there were no regional wall motion abnormalities. Doppler parameters are consistent with abnormal left ventricular relaxation (grade 1 diastolic dysfunction). - Left atrium: Volume/bsa, ES, (1-plane Simpson&'s, A2C): 35.2 ml/m^2. - Right ventricle: The cavity size was mildly dilated. Wall thickness was normal.  Assessment & Plan   1.  Essential hypertension-BP today 90/50.  Labile at home.  Continue amlodipine 5 mg twice daily Continue hydralazine 100 mg tablet 3 times daily Continue hydralazine 50 mg tablet twice daily Continue metoprolol tartrate 25 mg tablet twice daily Increase physical activity as tolerated Low-sodium heart healthy diet-salty 6 given  2.  Chronic diastolic heart failure-euvolemic today.  Echocardiogram 04/10/2018 showed LVEF 60 to 65% with grade 1 diastolic dysfunction and left ventricle mild dilation Continue furosemide 20 mg tablet daily tablet daily Continue potassium chloride20 mEq as needed Continue Spironolactone 25 mg tablet daily Increase physical activity as tolerated Low-sodium heart healthy diet-salty 6 given  3.  Hyperlipidemia-10/22/2019: Cholesterol 187; HDL 37.10; LDL Cholesterol 114; Triglycerides 179.0; VLDL 35.8 Start Ezetimibe 10 mg daily  Heart healthy low-sodium high-fiber diet Lipid panel, LFTs 8 weeks  4.  Pulmonary hypertension-shown on CT scan 03/30/2018.  No increase shortness of breath with  activity.  Followed by pulmonary Increase physical activity as tolerated Low-sodium heart healthy diet Continue weight loss Follow-up with Dr. Chase Caller to discuss alternatives to CPAP.  5.  Coronary artery disease-no chest pain today.    No recent episodes of chest discomfort/pain.   Continue aspirin 81 mg tablet daily Start ezetimibe Increase physical activity as tolerated   Low-sodium heart healthy diet  Disposition: Follow-up with Dr. Stanford Breed in 6 months.  Jossie Ng. [facility] [place] [address] Office [phone] Fax [phone]

## 2019-12-08 ENCOUNTER — Ambulatory Visit: Payer: Medicare HMO | Admitting: Pharmacist

## 2019-12-08 ENCOUNTER — Other Ambulatory Visit: Payer: Self-pay

## 2019-12-08 DIAGNOSIS — I10 Essential (primary) hypertension: Secondary | ICD-10-CM

## 2019-12-08 DIAGNOSIS — F32A Depression, unspecified: Secondary | ICD-10-CM

## 2019-12-08 DIAGNOSIS — E1165 Type 2 diabetes mellitus with hyperglycemia: Secondary | ICD-10-CM

## 2019-12-08 DIAGNOSIS — IMO0002 Reserved for concepts with insufficient information to code with codable children: Secondary | ICD-10-CM

## 2019-12-08 DIAGNOSIS — F329 Major depressive disorder, single episode, unspecified: Secondary | ICD-10-CM

## 2019-12-08 DIAGNOSIS — E782 Mixed hyperlipidemia: Secondary | ICD-10-CM
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
## 2019-12-08 NOTE — Patient Instructions (Addendum)
Visit Information  Goals Addressed            This Visit's Progress   . Diabetes: goal A1c < 8%       CARE PLAN ENTRY (see longitudinal plan of care for additional care plan information)  Current Barriers:  . Diabetes: uncontrolled; complicated by chronic medical conditions including HTN, HLD Lab Results  Component Value Date   HGBA1C 11.0 (H) 10/22/2019 .   Lab Results  Component Value Date   CREATININE 0.98 10/22/2019   CREATININE 0.87 07/20/2019   CREATININE 0.89 07/06/2019   . Current antihyperglycemic regimen: Lantus 75 units BID . Denies hypoglycemic symptoms, including dizziness, lightheadedness, shaking, sweating . denies hyperglycemic symptoms, including polyuria, polydipsia, polyphagia, nocturia, blurred vision, neuropathy . Current exercise: not exercising . Current blood glucose readings: 135-260 . Cardiovascular risk reduction: o Current hypertensive regimen: amlodipine 5 mg daily, furosemide 20 mg daily, spironolactone 25 mg daily, hydralazine 25 mg TID, metoprolol tartrate 25 mg BID, o Current hyperlipidemia regimen: no statin due to cirrhosis o Current antiplatelet regimen: aspirin 81 mg   Pharmacist Clinical Goal(s):  Marland Kitchen Over the next 30 days, patient will work with PharmD and primary care provider to address high blood sugar  Interventions: . Comprehensive medication review performed, medication list updated in electronic medical record . Inter-disciplinary care team collaboration (see longitudinal plan of care) .   Patient Self Care Activities:  . Patient will check blood glucose twice daily , document, and provide at future appointments . Patient will focus on medication adherence by fill dates . Patient will take medications as prescribed . Patient will contact provider with any episodes of hypoglycemia . Patient will report any questions or concerns to provider   Initial goal documentation     . Hypertension: Maintain BP between 110/60 and  140/90       CARE PLAN ENTRY (see longitudinal plan of care for additional care plan information)  Current Barriers:  . Uncontrolled hypertension, complicated by DM, HLD, CHF . Current antihypertensive regimen: amlodipine 5 mg daily, furosemide 20 mg daily, spironolactone 25 mg daily, hydralazine 25 mg TID, metoprolol tartrate 25 mg BID, . Previous antihypertensives tried: n/a . Last practice recorded BP readings:  BP Readings from Last 3 Encounters:  10/22/19 (!) 146/70  07/20/19 (!) 154/80  07/07/19 (!) 144/62   . Current home BP readings:   Pharmacist Clinical Goal(s):  Marland Kitchen Over the next 30 days, patient will work with PharmD and providers to optimize antihypertensive regimen  Interventions: . Inter-disciplinary care team collaboration  . Comprehensive medication review performed; medication list updated in the electronic medical record.  . Discussed how to handle high and low blood pressures o High BP: take extra hydralazine dose and recheck BP. If BP does not decrease to < 180/110, seek medical attention. o Low BP: provide fluids, snacks and recheck BP often. If BP does not rise to adequate level (>90/60), seek medical attention  Patient Self Care Activities:  . Patient will continue to check BP twice daily , document, and provide at future appointments . Patient will focus on medication adherence by fill date  Initial goal documentation     . Medication assistance: insulin       CARE PLAN ENTRY (see longitudinal plan of care for additional care plan information)  Current Barriers:  . Financial Barriers: patient has [employer] and reports copay for Nancee Liter is cost prohibitive at this time  Pharmacist Clinical Goal(s):  Marland Kitchen Over the next 30 days, patient  will work with PharmD and providers to relieve medication access concerns  Interventions: . Comprehensive medication review completed; medication list updated in electronic medical record.  Bertram Savin care  team collaboration (see longitudinal plan of care) . Basaglar by Orson Ape: Patient meets income/out of pocket spend criteria for this medication's patient assistance program. Reviewed application process. Patient will provide proof of income, out of pocket spend report, and will sign application. Will collaborate with primary care provider Dr Quay Burow for their portion of application. Once completed, will submit to [employer] patient assistance program.  Patient Self Care Activities:  . Patient will provide necessary portions of application   Initial goal documentation     . Pharmacy Care Plan   On track    CARE PLAN ENTRY  Current Barriers:  . Chronic Disease Management support, education, and care coordination needs related to HTN, HLD, and DMII  Pharmacist Clinical Goal(s):  Marland Kitchen Maintain fasting BG < 150 . Ensure safety, efficacy, and affordability of medications  Interventions: . Comprehensive medication review performed. . Continue Lantus 75 units twice daily . Pursue patient assistance for Basaglar insulin . Check fasting BG daily . Tier exception for amitriptyline and celecoxib was approved - both will be $0 copays through 08/25/2020.  Patient Self Care Activities:  . Self administers medications as prescribed, Calls pharmacy for medication refills, and Calls provider office for new concerns or questions  Initial goal documentation       The patient verbalized understanding of instructions provided today and agreed to receive a mailed copy of patient instruction and/or educational materials.  Telephone follow up appointment with pharmacy team member scheduled for: 1 month  Charlene Brooke, PharmD Clinical Pharmacist [facility] [phone]   Managing Anxiety, Adult After being diagnosed with an anxiety disorder, you may be relieved to know why you have felt or behaved a certain way. You may also feel overwhelmed about the treatment ahead and  what it will mean for your life. With care and support, you can manage this condition and recover from it. How to manage lifestyle changes Managing stress and anxiety  Stress is your body's reaction to life changes and events, both good and bad. Most stress will last just a few hours, but stress can be ongoing and can lead to more than just stress. Although stress can play a major role in anxiety, it is not the same as anxiety. Stress is usually caused by something external, such as a deadline, test, or competition. Stress normally passes after the triggering event has ended.  Anxiety is caused by something internal, such as imagining a terrible outcome or worrying that something will go wrong that will devastate you. Anxiety often does not go away even after the triggering event is over, and it can become long-term (chronic) worry. It is important to understand the differences between stress and anxiety and to manage your stress effectively so that it does not lead to an anxious response. Talk with your health care provider or a counselor to learn more about reducing anxiety and stress. He or she may suggest tension reduction techniques, such as:  Music therapy. This can include creating or listening to music that you enjoy and that inspires you.  Mindfulness-based meditation. This involves being aware of your normal breaths while not trying to control your breathing. It can be done while sitting or walking.  Centering prayer. This involves focusing on a word, phrase, or sacred image that means something to you  and brings you peace.  Deep breathing. To do this, expand your stomach and inhale slowly through your nose. Hold your breath for 3-5 seconds. Then exhale slowly, letting your stomach muscles relax.  Self-talk. This involves identifying thought patterns that lead to anxiety reactions and changing those patterns.  Muscle relaxation. This involves tensing muscles and then relaxing  them. Choose a tension reduction technique that suits your lifestyle and personality. These techniques take time and practice. Set aside 5-15 minutes a day to do them. Therapists can offer counseling and training in these techniques. The training to help with anxiety may be covered by some insurance plans. Other things you can do to manage stress and anxiety include:  Keeping a stress/anxiety diary. This can help you learn what triggers your reaction and then learn ways to manage your response.  Thinking about how you react to certain situations. You may not be able to control everything, but you can control your response.  Making time for activities that help you relax and not feeling guilty about spending your time in this way.  Visual imagery and yoga can help you stay calm and relax.  Medicines Medicines can help ease symptoms. Medicines for anxiety include:  Anti-anxiety drugs.  Antidepressants. Medicines are often used as a primary treatment for anxiety disorder. Medicines will be prescribed by a health care provider. When used together, medicines, psychotherapy, and tension reduction techniques may be the most effective treatment. Relationships Relationships can play a big part in helping you recover. Try to spend more time connecting with trusted friends and family members. Consider going to couples counseling, taking family education classes, or going to family therapy. Therapy can help you and others better understand your condition. How to recognize changes in your anxiety Everyone responds differently to treatment for anxiety. Recovery from anxiety happens when symptoms decrease and stop interfering with your daily activities at home or work. This may mean that you will start to:  Have better concentration and focus. Worry will interfere less in your daily thinking.  Sleep better.  Be less irritable.  Have more energy.  Have improved memory. It is important to recognize  when your condition is getting worse. Contact your health care provider if your symptoms interfere with home or work and you feel like your condition is not improving. Follow these instructions at home: Activity  Exercise. Most adults should do the following: ? Exercise for at least 150 minutes each week. The exercise should increase your heart rate and make you sweat (moderate-intensity exercise). ? Strengthening exercises at least twice a week.  Get the right amount and quality of sleep. Most adults need 7-9 hours of sleep each night. Lifestyle   Eat a healthy diet that includes plenty of vegetables, fruits, whole grains, low-fat dairy products, and lean protein. Do not eat a lot of foods that are high in solid fats, added sugars, or salt.  Make choices that simplify your life.  Do not use any products that contain nicotine or tobacco, such as cigarettes, e-cigarettes, and chewing tobacco. If you need help quitting, ask your health care provider.  Avoid caffeine, alcohol, and certain over-the-counter cold medicines. These may make you feel worse. Ask your pharmacist which medicines to avoid. General instructions  Take over-the-counter and prescription medicines only as told by your health care provider.  Keep all follow-up visits as told by your health care provider. This is important. Where to find support You can get help and support from these sources:  Self-help groups.  Online and [employer].  A trusted spiritual leader.  Couples counseling.  Family education classes.  Family therapy. Where to find more information You may find that joining a support group helps you deal with your anxiety. The following sources can help you locate counselors or support groups near you:  [place]: www.mentalhealth[place].net  Anxiety and Depression Association of [place] (ADAA): https://www.clark.net/  National Alliance on Mental Illness (NAMI): www.nami.org Contact  a health care provider if you:  Have a hard time staying focused or finishing daily tasks.  Spend many hours a day feeling worried about everyday life.  Become exhausted by worry.  Start to have headaches, feel tense, or have nausea.  Urinate more than normal.  Have diarrhea. Get help right away if you have:  A racing heart and shortness of breath.  Thoughts of hurting yourself or others. If you ever feel like you may hurt yourself or others, or have thoughts about taking your own life, get help right away. You can go to your nearest emergency department or call:  Your local emergency services (911 in the [place]  A suicide crisis helpline, such as the [place] at 804-050-5980. This is open 24 hours a day. Summary  Taking steps to learn and use tension reduction techniques can help calm you and help prevent triggering an anxiety reaction.  When used together, medicines, psychotherapy, and tension reduction techniques may be the most effective treatment.  Family, friends, and partners can play a big part in helping you recover from an anxiety disorder. This information is not intended to replace advice given to you by your health care provider. Make sure you discuss any questions you have with your health care provider. Document Revised: 01/12/2019 Document Reviewed: 01/12/2019 Elsevier Patient Education  [place].

## 2019-12-09 ENCOUNTER — Encounter: Payer: Self-pay | Admitting: General Practice

## 2019-12-09 ENCOUNTER — Ambulatory Visit: Payer: Medicare HMO | Admitting: General Practice

## 2019-12-09 VITALS — BP 90/50 | HR 70 | Temp 95.4°F | Ht 69.0 in | Wt 298.0 lb

## 2019-12-09 DIAGNOSIS — I5032 Chronic diastolic (congestive) heart failure: Secondary | ICD-10-CM

## 2019-12-09 DIAGNOSIS — Z79899 Other long term (current) drug therapy: Secondary | ICD-10-CM | POA: Diagnosis not present

## 2019-12-09 DIAGNOSIS — I10 Essential (primary) hypertension: Secondary | ICD-10-CM | POA: Diagnosis not present

## 2019-12-09 DIAGNOSIS — I272 Pulmonary hypertension, unspecified: Secondary | ICD-10-CM | POA: Diagnosis not present

## 2019-12-09 DIAGNOSIS — I2510 Atherosclerotic heart disease of native coronary artery without angina pectoris: Secondary | ICD-10-CM

## 2019-12-09 DIAGNOSIS — E7800 Pure hypercholesterolemia, unspecified: Secondary | ICD-10-CM | POA: Diagnosis not present

## 2019-12-09 MED ORDER — EZETIMIBE 10 MG PO TABS: 10.0000 mg | ORAL_TABLET | Freq: Every day | ORAL | 3 refills | Status: DC

## 2019-12-09 NOTE — Patient Instructions (Signed)
Medication Instructions:  START ZETIA 10MG  IN THE EVENING *If you need a refill on your cardiac medications before your next appointment, please call your pharmacy*  Lab Work: FASTING LIPID AND LFT IN 8 WEEKS ABOUT 02-03-2020 If you have labs (blood work) drawn today and your tests are completely normal, you will receive your results only by:  [place] (if you have MyChart) OR A paper copy in the mail.  If you have any lab test that is abnormal or we need to change your treatment, we will call you to review the results. You may go to any [facility] that is convenient for you however, we do have a lab in our office that is able to assist you. You DO NOT need an appointment for our lab. The lab is open 8:00am and closes at 4:00pm. Lunch 12:45 - 1:45pm.  Special Instructions FOLLOW UP NEXT MONTH WITH DR RAMASWAMY  PLEASE READ AND FOLLOW SALTY 6-ATTACHED  Follow-Up: Your next appointment:  6 month(s) Please call our office 2 months in advance to schedule this appointment In Person with Kirk Ruths, MD  At [facility], you and your health needs are our priority.  As part of our continuing mission to provide you with exceptional heart care, we have created designated Provider Care Teams.  These Care Teams include your primary Cardiologist (physician) and Advanced Practice Providers (APPs -  Physician Assistants and Nurse Practitioners) who all work together to provide you with the care you need, when you need it.  We recommend signing up for the patient portal called "MyChart".  Sign up information is provided on this After Visit Summary.  MyChart is used to connect with patients for Virtual Visits (Telemedicine).  Patients are able to view lab/test results, encounter notes, upcoming appointments, etc.  Non-urgent messages can be sent to your provider as well.   To learn more about what you can do with MyChart, go to [email or web address].

## 2019-12-11 DIAGNOSIS — J449 Chronic obstructive pulmonary disease, unspecified: Secondary | ICD-10-CM | POA: Diagnosis not present

## 2019-12-18 DIAGNOSIS — E119 Type 2 diabetes mellitus without complications: Secondary | ICD-10-CM | POA: Diagnosis not present

## 2019-12-24 DIAGNOSIS — G894 Chronic pain syndrome: Secondary | ICD-10-CM | POA: Diagnosis not present

## 2019-12-24 DIAGNOSIS — G8929 Other chronic pain: Secondary | ICD-10-CM | POA: Diagnosis not present

## 2019-12-24 DIAGNOSIS — Z5181 Encounter for therapeutic drug level monitoring: Secondary | ICD-10-CM | POA: Diagnosis not present

## 2019-12-24 DIAGNOSIS — M5137 Other intervertebral disc degeneration, lumbosacral region: Secondary | ICD-10-CM | POA: Diagnosis not present

## 2019-12-24 DIAGNOSIS — M533 Sacrococcygeal disorders, not elsewhere classified: Secondary | ICD-10-CM | POA: Diagnosis not present

## 2019-12-24 DIAGNOSIS — M25562 Pain in left knee: Secondary | ICD-10-CM | POA: Diagnosis not present

## 2019-12-24 DIAGNOSIS — Z0289 Encounter for other administrative examinations: Secondary | ICD-10-CM | POA: Diagnosis not present

## 2020-01-05 DIAGNOSIS — J449 Chronic obstructive pulmonary disease, unspecified: Secondary | ICD-10-CM | POA: Diagnosis not present

## 2020-01-10 ENCOUNTER — Ambulatory Visit: Payer: Self-pay | Admitting: Pharmacist

## 2020-01-10 DIAGNOSIS — J449 Chronic obstructive pulmonary disease, unspecified: Secondary | ICD-10-CM | POA: Diagnosis not present

## 2020-01-10 DIAGNOSIS — IMO0002 Reserved for concepts with insufficient information to code with codable children: Secondary | ICD-10-CM

## 2020-01-10 DIAGNOSIS — E782 Mixed hyperlipidemia: Secondary | ICD-10-CM

## 2020-01-10 NOTE — Chronic Care Management (AMB) (Signed)
  Chronic Care Management   Outreach Note  01/10/2020 Name: Mary Acosta MRN: [id number] DOB: 12/17/1955  Referred by: Binnie Rail, MD Reason for referral : Chronic Care Management (Basaglar PAP approval)  Patient reported to me that she mailed Basaglar PAP last month and has not heard anything. I reached out to [employer] to determine status of application, they report that application was approved but they need new rx - fax or verbal. Provided verbal order for Basaglar 75 units BID - 90 day supply with 3 refills -  to expedite delivery to patient. Informed patient of approval and expected delivery date for Basaglar. Pt voiced understanding and very appreciative.  Follow Up Plan: renew Basaglar PAP yearly  Charlton Haws, [facility]

## 2020-01-10 NOTE — Progress Notes (Deleted)
  Chronic Care Management   Outreach Note  01/10/2020 Name: Mary Acosta MRN: [id number] DOB: [birth year]  Referred by: Binnie Rail, MD Reason for referral : Chronic Care Management (Basaglar PAP approval)  Patient reported to me that she mailed Basaglar PAP last month and has not heard anything. I reached out to [employer] to determine status of application, they report that application was approved but they need new rx - fax or verbal. Provided verbal order for Basaglar 75 units BID - 90 day supply with 3 refills -  to expedite delivery to patient. Informed patient of approval and expected delivery date for Basaglar. Pt voiced understanding and very appreciative.  Follow Up Plan: renew Basaglar PAP yearly  Charlton Haws, [facility]

## 2020-01-11 DIAGNOSIS — M1990 Unspecified osteoarthritis, unspecified site: Secondary | ICD-10-CM | POA: Diagnosis not present

## 2020-01-11 DIAGNOSIS — M25562 Pain in left knee: Secondary | ICD-10-CM | POA: Diagnosis not present

## 2020-01-11 DIAGNOSIS — M533 Sacrococcygeal disorders, not elsewhere classified: Secondary | ICD-10-CM | POA: Diagnosis not present

## 2020-01-11 DIAGNOSIS — G8929 Other chronic pain: Secondary | ICD-10-CM | POA: Diagnosis not present

## 2020-01-11 DIAGNOSIS — Z79899 Other long term (current) drug therapy: Secondary | ICD-10-CM | POA: Diagnosis not present

## 2020-01-11 DIAGNOSIS — Z87891 Personal history of nicotine dependence: Secondary | ICD-10-CM | POA: Diagnosis not present

## 2020-01-12 ENCOUNTER — Other Ambulatory Visit: Payer: Self-pay

## 2020-01-12 ENCOUNTER — Ambulatory Visit: Payer: Medicare HMO | Admitting: Pulmonary Disease

## 2020-01-12 ENCOUNTER — Encounter: Payer: Self-pay | Admitting: Pulmonary Disease

## 2020-01-12 VITALS — BP 126/83 | HR 89 | Temp 97.7°F | Ht 69.0 in | Wt 309.6 lb

## 2020-01-12 DIAGNOSIS — J9611 Chronic respiratory failure with hypoxia: Secondary | ICD-10-CM | POA: Diagnosis not present

## 2020-01-12 DIAGNOSIS — G4733 Obstructive sleep apnea (adult) (pediatric): Secondary | ICD-10-CM | POA: Diagnosis not present

## 2020-01-12 DIAGNOSIS — I10 Essential (primary) hypertension: Secondary | ICD-10-CM | POA: Diagnosis not present

## 2020-01-12 MED ORDER — FUROSEMIDE 40 MG PO TABS: 40.0000 mg | ORAL_TABLET | Freq: Every day | ORAL | 0 refills | Status: DC

## 2020-01-12 NOTE — Patient Instructions (Signed)
Rx for lasix 40 mg daily x 1 month, further refills from PCP Sleep on a wedge pillow with oxygen

## 2020-01-12 NOTE — Assessment & Plan Note (Signed)
Rx for lasix 40 mg daily x 1 month, further refills from PCP

## 2020-01-12 NOTE — Assessment & Plan Note (Signed)
Ct O2 during sleep

## 2020-01-12 NOTE — Assessment & Plan Note (Addendum)
Reviewed sleep studies & confirmed that she has no centrals ,mainly obstructive sleep apnea Has failed CPAP & I offered her mask desensitization but unwilling to try again Dental appliance  - not a good candidate , severe OSA Inspire device  - BMI too high  Only remaining alternative here is to Sleep upright on a wedge pillow with oxygen Aim for weight loss in the long run Narcotics have been decreased to lower doses

## 2020-01-12 NOTE — Progress Notes (Signed)
   Subjective:    Patient ID: Mary Acosta, female    DOB: Apr 07, 1956, 64 y.o.   MRN: [id number]  HPI 64 yo remote smoker for follow-up of moderate OSA She has chronic pain on narcotics .   Prior eval for dyspnea on exertion, high-resolution CT did not show any evidence of ILD, previous echo has not shown any evidence of pulmonary hypertension, she sees my partner Dr. Chase Caller for this.   Chief Complaint  Patient presents with  . Follow-up    pt states she wants an implant.pt states can't were cpap due to claustraphobia    Last OV 03/2018  She was started on auto CPAP in 2019 but unable to tolerate due to severe claustrophobia . Download showed that CPAP was effective . Masks were changed -she trialled all kinds but ultimately returned the machine.  She would like to discuss alternative options  Not sleeping well, in fact 'afraid to sleep' due to apneas Her wt is unchanged , gained 10 lbs  lost 34 yo grandson  A year ago- still grieving   Was on high-dose morphine and Dilaudid for breakthrough - now off dilaudid & morphine decreased to twice daily  Reports pedal edema , lasix ? Increased suars Depressed affect  Significant tests/ events reviewed  NPSG 09/2017 AHI 27/h >>CPAP 13 cm  NPSG 10/2014- 291 lbs _AHI 0.4/hour with lowest desaturation of 82%>>started on nocturnal oxygen   2006 NPSG  severe OSA AHI 40/h corrected by CPAP 16cm  PFTs 10/2014-ratio 81, FEV1 82%, FVC 79%, TLC 87% and DLCO 57%  Echo RVSP 22 HRCT 03/2018 no ILD, PA trunk 3.7 cm   Review of Systems neg for any significant sore throat, dysphagia, itching, sneezing, nasal congestion or excess/ purulent secretions, fever, chills, sweats, unintended wt loss, pleuritic or exertional cp, hempoptysis, orthopnea pnd or change in chronic leg swelling. Also denies presyncope, palpitations, heartburn, abdominal pain, nausea, vomiting, diarrhea or change in bowel or urinary habits, dysuria,hematuria,  rash, arthralgias, visual complaints, headache, numbness weakness or ataxia.'    Objective:   Physical Exam   Gen. Pleasant, obese, in no distress ENT - no lesions, no post nasal drip Neck: No JVD, no thyromegaly, no carotid bruits Lungs: no use of accessory muscles, no dullness to percussion, decreased without rales or rhonchi  Cardiovascular: Rhythm regular, heart sounds  normal, no murmurs or gallops, 1+ peripheral edema Musculoskeletal: No deformities, no cyanosis or clubbing , no tremors         Assessment & Plan:

## 2020-01-13 ENCOUNTER — Telehealth: Payer: Medicare HMO

## 2020-01-14 ENCOUNTER — Encounter: Payer: Self-pay | Admitting: Gastroenterology

## 2020-01-19 ENCOUNTER — Telehealth: Payer: Self-pay

## 2020-01-19 MED ORDER — HYDROXYZINE HCL 25 MG PO TABS: 25.0000 mg | ORAL_TABLET | Freq: Three times a day (TID) | ORAL | 0 refills | Status: DC | PRN

## 2020-01-19 NOTE — Telephone Encounter (Signed)
New message    The patient voiced  Upcoming appt on 5.28.21  C/o itching all over has taken over-the-counter medication.   Asking for a callback

## 2020-01-19 NOTE — Telephone Encounter (Signed)
sent 

## 2020-01-19 NOTE — Telephone Encounter (Signed)
Can you send something for itching for her to [facility]? Going on since yesterday.

## 2020-01-20 NOTE — Telephone Encounter (Signed)
Pt aware.

## 2020-01-20 NOTE — Progress Notes (Signed)
Virtual Visit via telephone Note  I connected with Mary Acosta on 01/21/20 at  1:45 PM EDT by telephone and verified that I am speaking with the correct person using two identifiers.   I discussed the limitations of evaluation and management by telemedicine and the availability of in person appointments. The patient expressed understanding and agreed to proceed.  Present for the visit:  Myself, Dr Billey Gosling, Wilhemina Bonito.  The patient is currently at home and I am in the office.    No referring provider.    History of Present Illness: She is here for an acute visit for cold symptoms.   Her son got sick, then her and her husband got sick.    Her symptoms started 3-4 days ago  She is experiencing productive cough with discolored sputum, wheeze, SOB.  She denies fever.    She has tried taking allergy medication, albuterol inhaler, mucinex   Review of Systems  Constitutional: Positive for malaise/fatigue (chronic). Negative for chills and fever.  HENT: Negative for congestion, ear pain, sinus pain and sore throat.   Respiratory: Positive for cough, sputum production (yellow - brown), shortness of breath and wheezing.   Gastrointestinal: Negative for diarrhea and nausea.  Neurological: Negative for dizziness and headaches.      Social History   Socioeconomic History  . Marital status: Married    Spouse name: Not on file  . Number of children: 2  . Years of education: 69  . Highest education level: Not on file  Occupational History  . Occupation: disabled    Fish farm manager: UNEMPLOYED  Tobacco Use  . Smoking status: Former Smoker    Packs/day: 2.50    Years: 39.00    Pack years: 97.50    Types: Cigarettes    Quit date: 10/22/2010    Years since quitting: 9.2  . Smokeless tobacco: Never Used  Substance and Sexual Activity  . Alcohol use: No    Alcohol/week: 0.0 standard drinks  . Drug use: No  . Sexual activity: Not Currently  Other Topics Concern  .  Not on file  Social History Narrative   Lives at home w/ her husband and grandson   Right-handed   Caffeine: 1 cup of coffee per week + [employer]   Social Determinants of Health   Financial Resource Strain:   . Difficulty of Paying Living Expenses:   Food Insecurity:   . Worried About Charity fundraiser in the Last Year:   . Arboriculturist in the Last Year:   Transportation Needs:   . Film/video editor (Medical):   Marland Kitchen Lack of Transportation (Non-Medical):   Physical Activity:   . Days of Exercise per Week:   . Minutes of Exercise per Session:   Stress:   . Feeling of Stress :   Social Connections:   . Frequency of Communication with Friends and Family:   . Frequency of Social Gatherings with Friends and Family:   . Attends Religious Services:   . Active Member of Clubs or Organizations:   . Attends Archivist Meetings:   Marland Kitchen Marital Status:       Assessment and Plan:  See Problem List for Assessment and Plan of chronic medical problems.   Follow Up Instructions:    I discussed the assessment and treatment plan with the patient. The patient was provided an opportunity to ask questions and all were answered. The patient agreed with the plan and demonstrated an understanding  of the instructions.   The patient was advised to call back or seek an in-person evaluation if the symptoms worsen or if the condition fails to improve as anticipated.  Time spent on telephone: 8 minutes  Binnie Rail, MD

## 2020-01-21 ENCOUNTER — Ambulatory Visit: Payer: Medicare HMO

## 2020-01-21 ENCOUNTER — Other Ambulatory Visit: Payer: Self-pay

## 2020-01-21 ENCOUNTER — Telehealth (INDEPENDENT_AMBULATORY_CARE_PROVIDER_SITE_OTHER): Payer: Medicare HMO | Admitting: Internal Medicine

## 2020-01-21 ENCOUNTER — Encounter: Payer: Self-pay | Admitting: Internal Medicine

## 2020-01-21 DIAGNOSIS — J209 Acute bronchitis, unspecified: Secondary | ICD-10-CM

## 2020-01-21 MED ORDER — DOXYCYCLINE HYCLATE 100 MG PO TABS: 100.0000 mg | ORAL_TABLET | Freq: Two times a day (BID) | ORAL | 0 refills | Status: DC

## 2020-01-21 NOTE — Assessment & Plan Note (Signed)
Likely bacterial  Start doxycycline Continue inhalers - use regularly otc cold medications Rest, fluid Call if no improvement

## 2020-01-23 IMAGING — DX DG CHEST 2V
2 series · 2 of 2 positions shown · non-contrast
Comparison: 03/24/2018

CLINICAL DATA: Chest pain and shortness of breath.

EXAM:
CHEST - 2 VIEW

[w chest pa]
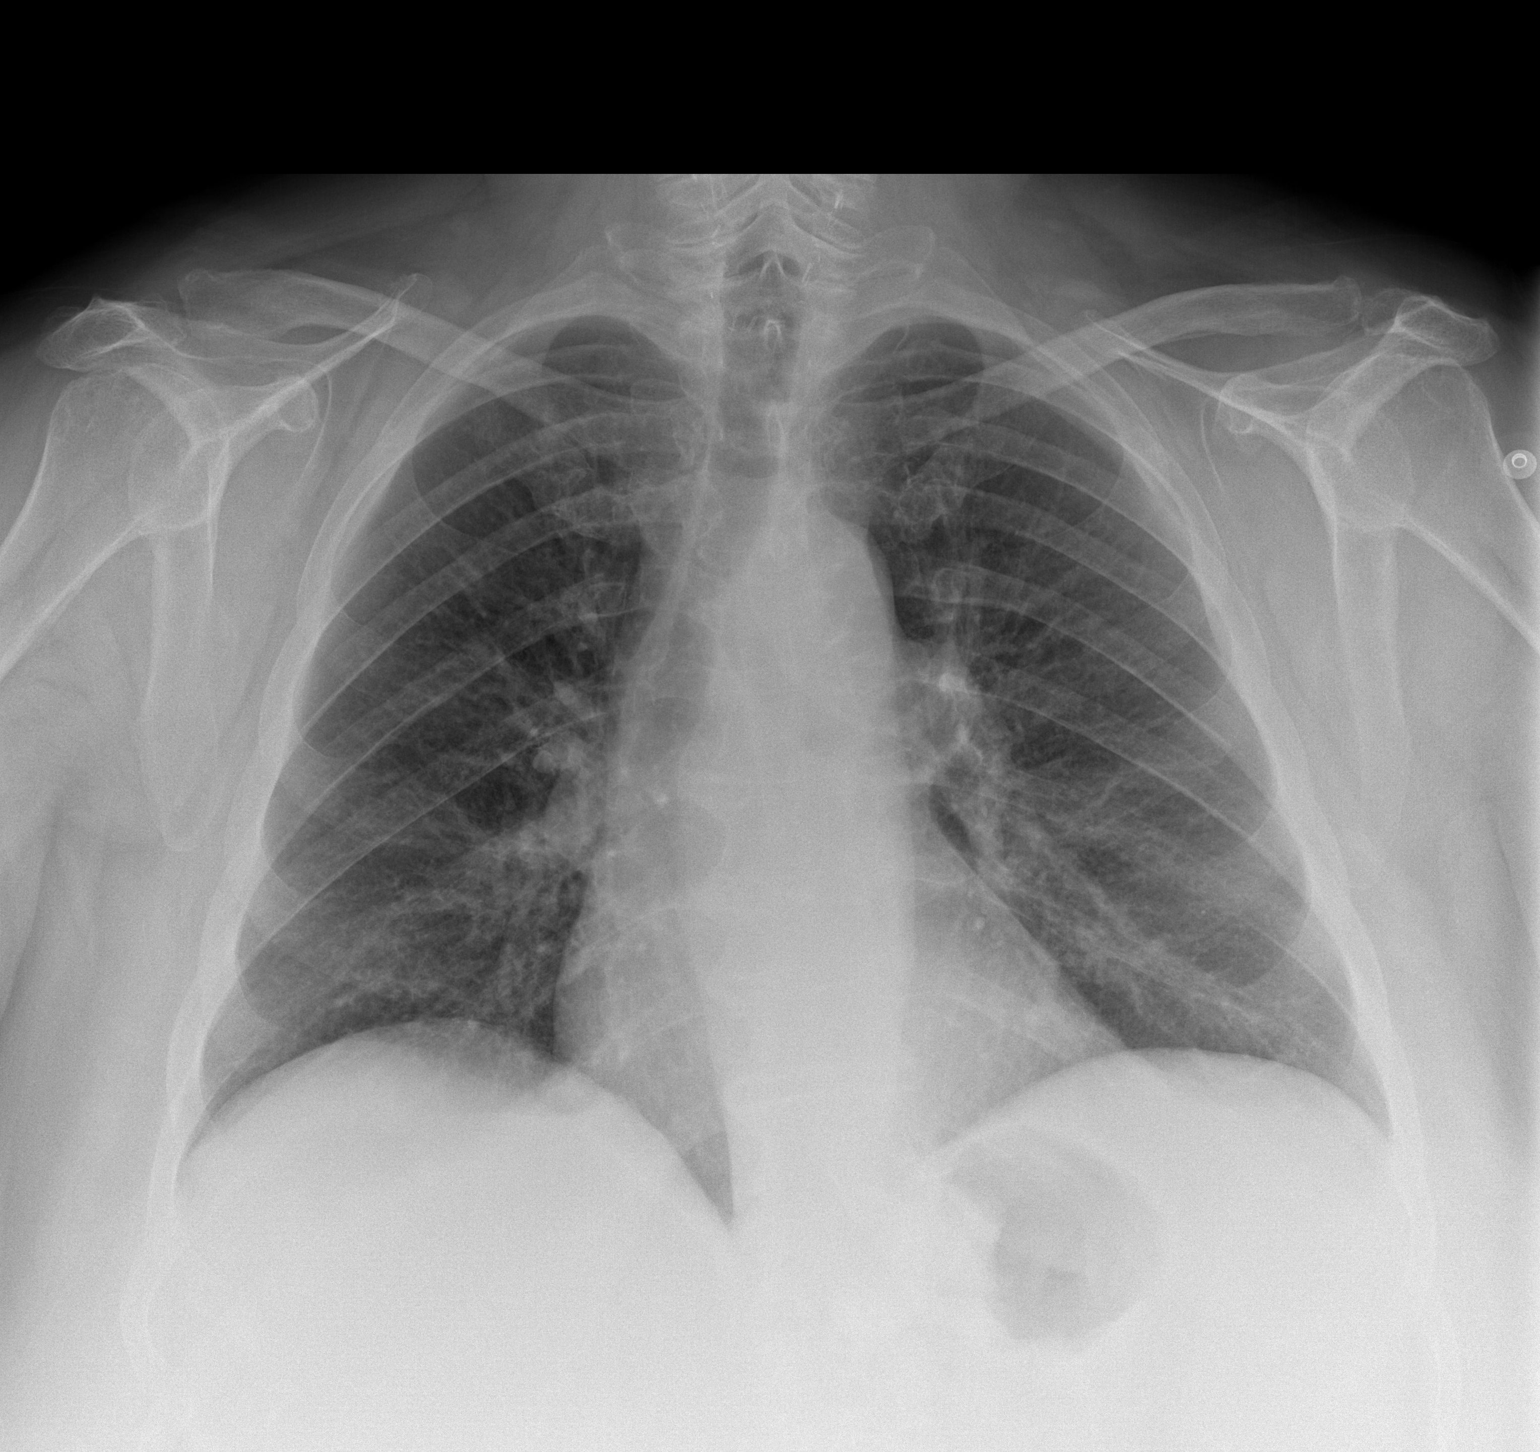

[w chest lat]
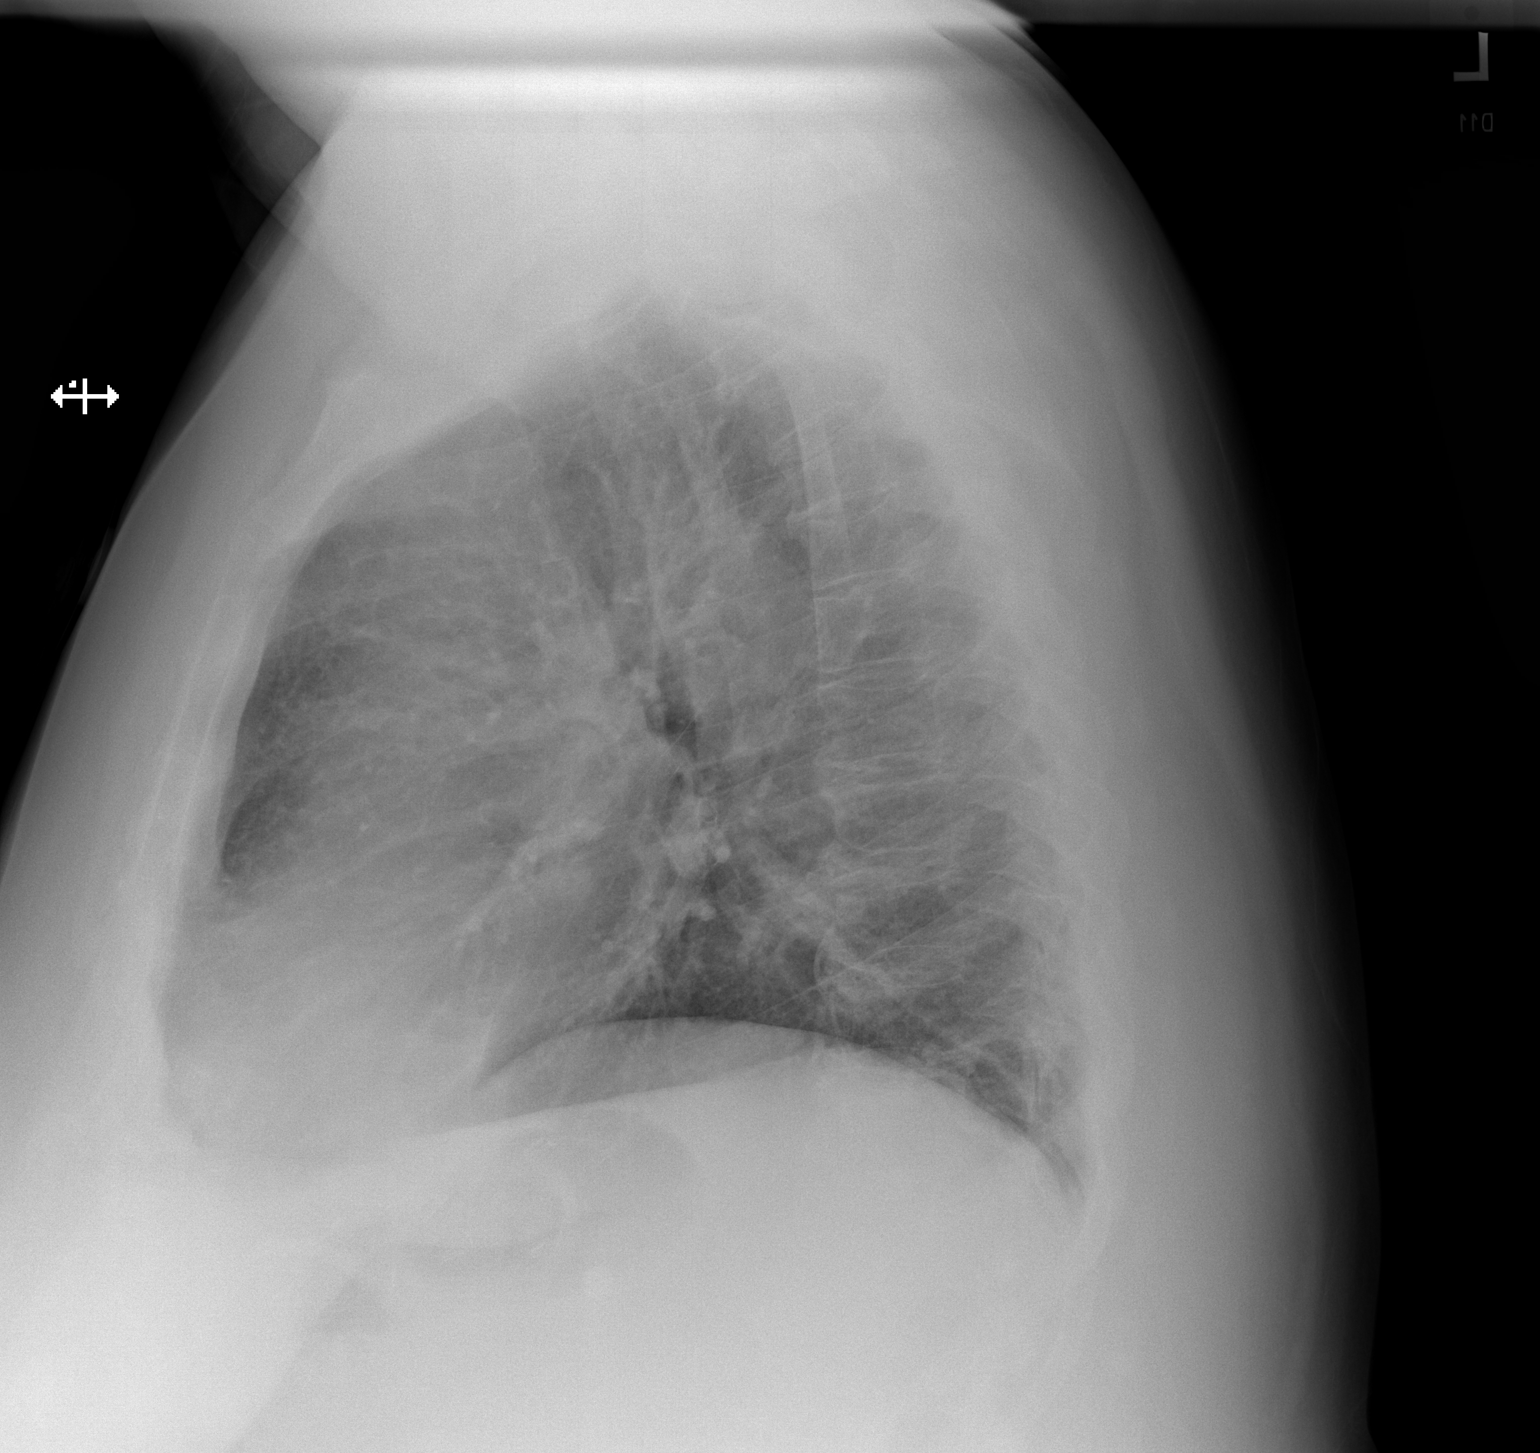

[2 of 2 positions shown; findings below may reference images not displayed]

FINDINGS: The heart size and mediastinal contours are within normal limits.
Both lungs are clear. The visualized skeletal structures are
unremarkable.
IMPRESSION: Normal exam.

## 2020-01-25 ENCOUNTER — Other Ambulatory Visit: Payer: Self-pay | Admitting: Physician Assistant

## 2020-01-28 ENCOUNTER — Ambulatory Visit (INDEPENDENT_AMBULATORY_CARE_PROVIDER_SITE_OTHER): Payer: Medicare HMO

## 2020-01-28 ENCOUNTER — Other Ambulatory Visit: Payer: Self-pay

## 2020-01-28 VITALS — BP 140/100 | HR 81 | Temp 98.2°F | Ht 69.0 in | Wt 290.0 lb

## 2020-01-28 DIAGNOSIS — Z0000 Encounter for general adult medical examination without abnormal findings: Secondary | ICD-10-CM

## 2020-01-28 NOTE — Progress Notes (Signed)
Subjective:   Mary Acosta is a 64 y.o. female who presents for Medicare Annual (Subsequent) preventive examination.  Review of Systems:  No ROS. Medicare Wellness Visit Cardiac Risk Factors include: diabetes mellitus;dyslipidemia;family history of premature cardiovascular disease;hypertension;obesity (BMI >30kg/m2);smoking/ tobacco exposure     Objective:     Vitals: There were no vitals taken for this visit.  There is no height or weight on file to calculate BMI.  Advanced Directives 01/28/2020 07/06/2019 05/14/2018 12/08/2017 10/07/2017 01/31/2017 01/31/2017  Does Patient Have a Medical Advance Directive? No No No No No No No  Would patient like information on creating a medical advance directive? No - Patient declined No - Patient declined No - Patient declined Yes (Inpatient - patient requests chaplain consult to create a medical advance directive) Yes (MAU/Ambulatory/Procedural Areas - Information given) No - Patient declined -  Pre-existing out of facility DNR order (yellow form or pink MOST form) - - - - - - -    Tobacco Social History   Tobacco Use  Smoking Status Former Smoker  . Packs/day: 2.50  . Years: 39.00  . Pack years: 97.50  . Types: Cigarettes  . Quit date: 10/22/2010  . Years since quitting: 9.2  Smokeless Tobacco Never Used     Counseling given: Not Answered   Clinical Intake:                       Past Medical History:  Diagnosis Date  . AKI (acute kidney injury) ([place]) 01/2017  . Anxiety    with panic attacks  . Arthritis    "back; feet; hands; shoulders" (08/26/2014)  . Asthma   . Cervical cancer ([place])   . Chronic lower back pain   . Chronic narcotic use   . Chronic pain syndrome    [facility]  . Cirrhosis ([place])   . Clostridium difficile infection 2017  . COPD (chronic obstructive pulmonary disease) ([place])   . Daily headache   . Depression   . Diabetic neuropathy ([place]) 06/04/2017  . DJD (degenerative joint  disease)   . Fatty liver disease, nonalcoholic   . Fibromyalgia   . Frequency of urination   . HCAP (healthcare-associated pneumonia) 08/26/2014  . History of TIA (transient ischemic attack) 11-01-2010   NO RESIDUAL  . Hyperlipidemia   . Hypertension   . Hypothyroidism   . IDDM (insulin dependent diabetes mellitus)   . Insomnia   . Lumbar stenosis   . Memory difficulty 07/25/2016  . Nocturia   . OSA (obstructive sleep apnea)    NO CPAP SINCE WT LOSS  . Osteoarthritis    with severe disease in knee  . Pneumonia "several times"  . Polymyalgia rheumatica ([place])   . Scoliosis   . Seasonal allergies   . Thyroid cancer ([place])   . Urgency of urination   . Vaginal pain S/P SLING  FEB 2012   Past Surgical History:  Procedure Laterality Date  . APPENDECTOMY  1982  . BREAST EXCISIONAL BIOPSY Left 02/28/2005   Atypical Ductal Hyperplasia  . CARDIAC CATHETERIZATION  09-04-2004   NORMAL CORONARY ANATOMY/ NORMAL LVF/ EF 60%  . CARDIOVASCULAR STRESS TEST  12-27-2010  DR [place]   ABNORMAL NUCLEAR STUDY W/ /MILD INFERIOR ISCHEMIA/ EF 69%/  CT HEART ANGIOGRAM ;  NO ACUTE FINDINGS  . CRYOABLATION  05/16/2003   w/LEEP FOR ABNORMAL PAP SMEAR  . CYSTOSCOPY  05/18/2012   Procedure: CYSTOSCOPY;  Surgeon: Elayne Snare  MacDiarmid, MD;  Location: [facility];  Service: Urology;  Laterality: N/A;  examination under anethesia  . ESOPHAGOGASTRODUODENOSCOPY (EGD) WITH PROPOFOL N/A 09/03/2016   Procedure: ESOPHAGOGASTRODUODENOSCOPY (EGD) WITH PROPOFOL;  Surgeon: Doran Stabler, MD;  Location: [facility];  Service: Gastroenterology;  Laterality: N/A;  . HYSTEROSCOPY WITH D & C  08-19-2007   PMB  . KNEE ARTHROSCOPY W/ DEBRIDEMENT Left 03/29/2006   INTERNAL DERANGEMENT/ SEVERE DJD/ MENISCUS TEARS  . LAPAROSCOPIC CHOLECYSTECTOMY  06-10-2002  . LAPAROSCOPIC GASTRIC BANDING  03/01/2006   TRUNCAL VAGOTOMY/ PLACEMENT OF VG BAND  . REVISION TOTAL KNEE ARTHROPLASTY Left 08-29-2008; 05/2009  .  TONSILLECTOMY  1969  . TOTAL KNEE ARTHROPLASTY Left 01-23-2007   SEVERE DJD  . TOTAL THYROIDECTOMY  11-22-2005   BILATERAL THYROID NODULES-- PAPILLARY CARCINOMA (0.5CM)/ ADENOMATOID NODULES  . TRANSTHORACIC ECHOCARDIOGRAM  12-27-2010   LVSF NORMAL / EF 56-31%/ GRADE I DIASTOLIC DYSFUNCTION/ MILD MITRAL REGURG. / MILDLY DILATED LEFT ATRIUM/ MILDY INCREASED SYSTOLIC PRESSURE OF PULMONARY ARTERIES  . TRANSVAGINAL SUBURETERAL TAPE/ SLING  09-28-2010   MIXED URINARY INCONTINENCE  . TUBAL LIGATION  1983   Family History  Problem Relation Age of Onset  . Diabetes Mother   . Heart disease Mother   . Dementia Mother   . Heart disease Father   . High blood pressure Father   . Colon cancer Maternal Uncle        x 2  . Breast cancer Other        great aunts x 5   Social History   Socioeconomic History  . Marital status: Married    Spouse name: Not on file  . Number of children: 2  . Years of education: 89  . Highest education level: Not on file  Occupational History  . Occupation: disabled    Fish farm manager: UNEMPLOYED  Tobacco Use  . Smoking status: Former Smoker    Packs/day: 2.50    Years: 39.00    Pack years: 97.50    Types: Cigarettes    Quit date: 10/22/2010    Years since quitting: 9.2  . Smokeless tobacco: Never Used  Substance and Sexual Activity  . Alcohol use: No    Alcohol/week: 0.0 standard drinks  . Drug use: No  . Sexual activity: Not Currently  Other Topics Concern  . Not on file  Social History Narrative   Lives at home w/ her husband and grandson   Right-handed   Caffeine: 1 cup of coffee per week + [employer]   Social Determinants of Health   Financial Resource Strain:   . Difficulty of Paying Living Expenses:   Food Insecurity:   . Worried About Charity fundraiser in the Last Year:   . Arboriculturist in the Last Year:   Transportation Needs:   . Film/video editor (Medical):   Marland Kitchen Lack of Transportation (Non-Medical):   Physical Activity:   . Days of  Exercise per Week:   . Minutes of Exercise per Session:   Stress:   . Feeling of Stress :   Social Connections:   . Frequency of Communication with Friends and Family:   . Frequency of Social Gatherings with Friends and Family:   . Attends Religious Services:   . Active Member of Clubs or Organizations:   . Attends Archivist Meetings:   Marland Kitchen Marital Status:     Outpatient Encounter Medications as of 01/28/2020  Medication Sig  . acetaminophen (TYLENOL) 500 MG tablet Take 1,000  mg by mouth 2 (two) times daily.  Marland Kitchen albuterol (VENTOLIN HFA) 108 (90 Base) MCG/ACT inhaler TAKE 2 PUFFS BY MOUTH EVERY 6 HOURS AS NEEDED FOR WHEEZE OR SHORTNESS OF BREATH (Patient taking differently: Inhale 2 puffs into the lungs every 6 (six) hours as needed for wheezing or shortness of breath. )  . amitriptyline (ELAVIL) 100 MG tablet Take 100 mg by mouth at bedtime.  Marland Kitchen amLODipine (NORVASC) 5 MG tablet TAKE 1 TABLET BY MOUTH TWICE A DAY  . aspirin EC 81 MG tablet Take 1 tablet (81 mg total) by mouth daily.  Marland Kitchen doxycycline (VIBRA-TABS) 100 MG tablet Take 1 tablet (100 mg total) by mouth 2 (two) times daily.  Marland Kitchen ezetimibe (ZETIA) 10 MG tablet Take 1 tablet (10 mg total) by mouth daily.  . furosemide (LASIX) 40 MG tablet Take 1 tablet (40 mg total) by mouth daily.  Marland Kitchen gabapentin (NEURONTIN) 300 MG capsule Take 300 mg by mouth 2 (two) times daily.   . hydrALAZINE (APRESOLINE) 100 MG tablet Take 1 tablet (100 mg total) by mouth 3 (three) times daily. (Patient taking differently: Take 100 mg by mouth 2 (two) times daily. )  . hydrALAZINE (APRESOLINE) 25 MG tablet TAKE 1 TABLET BY MOUTH THREE TIMES A DAY  . hydrOXYzine (ATARAX/VISTARIL) 25 MG tablet Take 1 tablet (25 mg total) by mouth 3 (three) times daily as needed for itching.  . insulin glargine (LANTUS) 100 UNIT/ML injection Inject 1.8 mLs (180 Units total) into the skin daily. (Patient taking differently: Inject 75 Units into the skin 2 (two) times daily. )  .  Insulin Pen Needle (PEN NEEDLES) 31G X 8 MM MISC 1 Device by Does not apply route daily.  . Insulin Syringe-Needle U-100 26G X 1/2" 1 ML MISC Use daily for insulin injection as directed  . ipratropium-albuterol (DUONEB) 0.5-2.5 (3) MG/3ML SOLN Take 3 mLs by nebulization every 6 (six) hours as needed (Wheezing or dyspnea.).  Marland Kitchen levothyroxine (SYNTHROID) 175 MCG tablet TAKE 1 TABLET BY MOUTH SIX DAYS A WEEK BEFORE BREAKFAST  . metoprolol tartrate (LOPRESSOR) 25 MG tablet Take 1 tablet (25 mg total) by mouth 2 (two) times daily.  . mirtazapine (REMERON) 45 MG tablet mirtazapine 45 mg tablet  TAKE 1 TABLET BY MOUTH EVERYDAY AT BEDTIME  . morphine (MS CONTIN) 30 MG 12 hr tablet Take 30 mg by mouth 2 (two) times daily.  . ondansetron (ZOFRAN-ODT) 4 MG disintegrating tablet Take 1 tablet (4 mg total) by mouth every 6 (six) hours as needed for nausea or vomiting.  Marland Kitchen PARoxetine (PAXIL) 20 MG tablet paroxetine 20 mg tablet  TAKE 1 TABLET BY MOUTH EVERY DAY  . potassium chloride SA (KLOR-CON M20) 20 MEQ tablet TAKE 2 TABLETS EVERY DAY AS NEEDED FOR CRAMPING  . spironolactone (ALDACTONE) 25 MG tablet TAKE 1 TABLET (25 MG TOTAL) BY MOUTH DAILY. CAUSES PAIN ONLY TAKES WHEN BP IS ELEVATED  . tiZANidine (ZANAFLEX) 2 MG tablet Take 2 mg by mouth as needed for muscle spasms.    No facility-administered encounter medications on file as of 01/28/2020.    Activities of Daily Living In your present state of health, do you have any difficulty performing the following activities: 01/28/2020 07/07/2019  Hearing? N N  Vision? Y N  Comment yes, sees a RETINAL SPECIALIST for eye injections -  Difficulty concentrating or making decisions? N N  Walking or climbing stairs? N N  Dressing or bathing? N N  Doing errands, shopping? Y N  Comment husband brings  her to her appts -  Preparing Food and eating ? N -  Using the Toilet? N -  In the past six months, have you accidently leaked urine? N -  Do you have problems with  loss of bowel control? N -  Managing your Medications? N -  Managing your Finances? N -  Housekeeping or managing your Housekeeping? N -  Some recent data might be hidden    Patient Care Team: Binnie Rail, MD as PCP - General (Internal Medicine) Stanford Breed Denice Bors, MD as PCP - Cardiology (Cardiology) Charlton Haws, [facility] as Pharmacist (Pharmacist)    Assessment:   This is a routine wellness examination for Mary Acosta.  Exercise Activities and Dietary recommendations Current Exercise Habits: The patient does not participate in regular exercise at present, Exercise limited by: orthopedic condition(s)  Goals    . Diabetes: goal A1c < 8%     CARE PLAN ENTRY (see longitudinal plan of care for additional care plan information)  Current Barriers:  . Diabetes: uncontrolled; complicated by chronic medical conditions including HTN, HLD Lab Results  Component Value Date   HGBA1C 11.0 (H) 10/22/2019 .   Lab Results  Component Value Date   CREATININE 0.98 10/22/2019   CREATININE 0.87 07/20/2019   CREATININE 0.89 07/06/2019   . Current antihyperglycemic regimen: Lantus 75 units BID . Denies hypoglycemic symptoms, including dizziness, lightheadedness, shaking, sweating . denies hyperglycemic symptoms, including polyuria, polydipsia, polyphagia, nocturia, blurred vision, neuropathy . Current exercise: not exercising . Current blood glucose readings: 135-260 . Cardiovascular risk reduction: o Current hypertensive regimen: amlodipine 5 mg daily, furosemide 20 mg daily, spironolactone 25 mg daily, hydralazine 25 mg TID, metoprolol tartrate 25 mg BID, o Current hyperlipidemia regimen: no statin due to cirrhosis o Current antiplatelet regimen: aspirin 81 mg   Pharmacist Clinical Goal(s):  Marland Kitchen Over the next 30 days, patient will work with PharmD and primary care provider to address high blood sugar  Interventions: . Comprehensive medication review performed, medication list updated in  electronic medical record . Inter-disciplinary care team collaboration (see longitudinal plan of care) .   Patient Self Care Activities:  . Patient will check blood glucose twice daily , document, and provide at future appointments . Patient will focus on medication adherence by fill dates . Patient will take medications as prescribed . Patient will contact provider with any episodes of hypoglycemia . Patient will report any questions or concerns to provider   Initial goal documentation     . Hypertension: Maintain BP between 110/60 and 140/90     CARE PLAN ENTRY (see longitudinal plan of care for additional care plan information)  Current Barriers:  . Uncontrolled hypertension, complicated by DM, HLD, CHF . Current antihypertensive regimen: amlodipine 5 mg daily, furosemide 20 mg daily, spironolactone 25 mg daily, hydralazine 25 mg TID, metoprolol tartrate 25 mg BID, . Previous antihypertensives tried: n/a . Last practice recorded BP readings:  BP Readings from Last 3 Encounters:  10/22/19 (!) 146/70  07/20/19 (!) 154/80  07/07/19 (!) 144/62   . Current home BP readings:   Pharmacist Clinical Goal(s):  Marland Kitchen Over the next 30 days, patient will work with PharmD and providers to optimize antihypertensive regimen  Interventions: . Inter-disciplinary care team collaboration  . Comprehensive medication review performed; medication list updated in the electronic medical record.  . Discussed how to handle high and low blood pressures o High BP: take extra hydralazine dose and recheck BP. If BP does not decrease to < 180/110,  seek medical attention. o Low BP: provide fluids, snacks and recheck BP often. If BP does not rise to adequate level (>90/60), seek medical attention  Patient Self Care Activities:  . Patient will continue to check BP twice daily , document, and provide at future appointments . Patient will focus on medication adherence by fill date  Initial goal documentation      . Medication assistance: insulin     CARE PLAN ENTRY (see longitudinal plan of care for additional care plan information)  Current Barriers:  . Financial Barriers: patient has [employer] and reports copay for Nancee Liter is cost prohibitive at this time  Pharmacist Clinical Goal(s):  Marland Kitchen Over the next 30 days, patient will work with PharmD and providers to relieve medication access concerns  Interventions: . Comprehensive medication review completed; medication list updated in electronic medical record.  Bertram Savin care team collaboration (see longitudinal plan of care) . Basaglar by Orson Ape: Patient meets income/out of pocket spend criteria for this medication's patient assistance program. Reviewed application process. Patient will provide proof of income, out of pocket spend report, and will sign application. Will collaborate with primary care provider Dr Quay Burow for their portion of application. Once completed, will submit to [employer] patient assistance program.  Patient Self Care Activities:  . Patient will provide necessary portions of application   Initial goal documentation     . Pharmacy Care Plan     CARE PLAN ENTRY  Current Barriers:  . Chronic Disease Management support, education, and care coordination needs related to HTN, HLD, and DMII  Pharmacist Clinical Goal(s):  Marland Kitchen Maintain fasting BG < 150 . Ensure safety, efficacy, and affordability of medications  Interventions: . Comprehensive medication review performed. . Continue Lantus 75 units twice daily . Pursue patient assistance for Basaglar insulin . Check fasting BG daily . Tier exception for amitriptyline and celecoxib was approved - both will be $0 copays through 08/25/2020.  Patient Self Care Activities:  . Self administers medications as prescribed, Calls pharmacy for medication refills, and Calls provider office for new concerns or questions  Initial goal documentation       Fall  Risk Fall Risk  01/28/2020 06/13/2017 06/04/2016 03/06/2016 05/15/2015  Falls in the past year? 1 No No No No  Number falls in past yr: 1 - - - -  Injury with Fall? 0 - - - -  Risk for fall due to : Impaired balance/gait;Impaired mobility;Orthopedic patient - - - Impaired balance/gait  Risk for fall due to: Comment - - - - bad knees  Follow up Falls evaluation completed;Education provided;Falls prevention discussed - - - -   Is the patient's home free of loose throw rugs in walkways, pet beds, electrical cords, etc?   yes      Grab bars in the bathroom? yes      Handrails on the stairs?   yes      Adequate lighting?   yes  Timed Get Up and Go performed: not indicated  Depression Screen PHQ 2/9 Scores 01/28/2020 09/04/2018 06/13/2017 06/04/2016  PHQ - 2 Score 1 0 0 0     Cognitive Function MMSE - Mini Mental State Exam 12/03/2017 06/04/2017 12/27/2016 07/25/2016  Orientation to time 5 5 4 5   Orientation to Place 4 5 5 5   Registration 3 3 3 3   Attention/ Calculation 3 1 5 5   Recall 2 1 1 1   Language- name 2 objects 2 2 2 2   Language- repeat 1 1 1  1  Language- follow 3 step command 3 2 3 2   Language- read & follow direction 1 1 1 1   Write a sentence 1 1 1 1   Copy design 1 0 0 1  Total score 26 22 26 27         Immunization History  Administered Date(s) Administered  . Hep A / Hep B 02/05/2017  . Hepatitis B, adult 03/05/2017, 08/14/2017  . Influenza,inj,Quad PF,6+ Mos 05/27/2015, 05/30/2016, 06/13/2017, 05/05/2018, 05/22/2019  . Influenza,inj,quad, With Preservative 04/26/2018, 05/24/2019  . Influenza-Unspecified 05/26/2014  . Moderna SARS-COVID-2 Vaccination 10/05/2019, 11/02/2019  . Pneumococcal Conjugate-13 10/23/2015  . Pneumococcal Polysaccharide-23 07/10/2014    Qualifies for Shingles Vaccine? Yes, will check with local pharmacy  Screening Tests Health Maintenance  Topic Date Due  . TETANUS/TDAP  Never done  . COLONOSCOPY  11/25/2017  . PAP SMEAR-Modifier  05/27/2019   . FOOT EXAM  10/09/2019  . OPHTHALMOLOGY EXAM  02/08/2020  . INFLUENZA VACCINE  03/26/2020  . HEMOGLOBIN A1C  04/20/2020  . MAMMOGRAM  12/02/2021  . PNEUMOCOCCAL POLYSACCHARIDE VACCINE AGE 23-64 HIGH RISK  Completed  . COVID-19 Vaccine  Completed  . Hepatitis C Screening  Completed  . HIV Screening  Completed    Cancer Screenings: Lung: Low Dose CT Chest recommended if Age 44-80 years, 30 pack-year currently smoking OR have quit w/in 15years. Patient does qualify. Breast:  Up to date on Mammogram? Yes   Up to date of Bone Density/Dexa? Yes Colorectal: No, will schedule with GI     Plan:    Reviewed health maintenance screenings with patient today and relevant education, vaccines, and/or referrals were provided.    Continue doing brain stimulating activities (puzzles, reading, adult coloring books, staying active) to keep memory sharp.    Continue to eat heart healthy diet (full of fruits, vegetables, whole grains, lean protein, water--limit salt, fat, and sugar intake) and increase physical activity as tolerated.  I have personally reviewed and noted the following in the patient's chart:   . Medical and social history . Use of alcohol, tobacco or illicit drugs  . Current medications and supplements . Functional ability and status . Nutritional status . Physical activity . Advanced directives . List of other physicians . Hospitalizations, surgeries, and ER visits in previous 12 months . Vitals . Screenings to include cognitive, depression, and falls . Referrals and appointments  In addition, I have reviewed and discussed with patient certain preventive protocols, quality metrics, and best practice recommendations. A written personalized care plan for preventive services as well as general preventive health recommendations were provided to patient.     Sheral Flow, LPN  09/04/5091  Nurse Health Advisor

## 2020-01-28 NOTE — Patient Instructions (Signed)
Mary Acosta , Thank you for taking time to come for your Medicare Wellness Visit. I appreciate your ongoing commitment to your health goals. Please review the following plan we discussed and let me know if I can assist you in the future.   Screening recommendations/referrals: Colonoscopy: 10/02/2009; patient will call and schedule with GI Mammogram: 12/03/2019 Bone Density: normal exam per patient Recommended yearly ophthalmology/optometry visit for glaucoma screening and checkup Recommended yearly dental visit for hygiene and checkup  Vaccinations: Influenza vaccine: 05/24/2019 Pneumococcal vaccine: completed Tdap vaccine: got at [facility] Shingles vaccine: need second dose  Covid-19: completed  Advanced directives: No  Conditions/risks identified: Yes  Next appointment: Please schedule your 1 year Medicare Wellness Visit with your Health Coach. Preventive Care 40-64 Years, Female Preventive care refers to lifestyle choices and visits with your health care provider that can promote health and wellness. What does preventive care include?  A yearly physical exam. This is also called an annual well check.  Dental exams once or twice a year.  Routine eye exams. Ask your health care provider how often you should have your eyes checked.  Personal lifestyle choices, including:  Daily care of your teeth and gums.  Regular physical activity.  Eating a healthy diet.  Avoiding tobacco and drug use.  Limiting alcohol use.  Practicing safe sex.  Taking low-dose aspirin daily starting at age 42.  Taking vitamin and mineral supplements as recommended by your health care provider. What happens during an annual well check? The services and screenings done by your health care provider during your annual well check will depend on your age, overall health, lifestyle risk factors, and family history of disease. Counseling  Your health care provider may ask you questions about  your:  Alcohol use.  Tobacco use.  Drug use.  Emotional well-being.  Home and relationship well-being.  Sexual activity.  Eating habits.  Work and work Statistician.  Method of birth control.  Menstrual cycle.  Pregnancy history. Screening  You may have the following tests or measurements:  Height, weight, and BMI.  Blood pressure.  Lipid and cholesterol levels. These may be checked every 5 years, or more frequently if you are over 38 years old.  Skin check.  Lung cancer screening. You may have this screening every year starting at age 64 if you have a 30-pack-year history of smoking and currently smoke or have quit within the past 15 years.  Fecal occult blood test (FOBT) of the stool. You may have this test every year starting at age 3.  Flexible sigmoidoscopy or colonoscopy. You may have a sigmoidoscopy every 5 years or a colonoscopy every 10 years starting at age 59.  Hepatitis C blood test.  Hepatitis B blood test.  Sexually transmitted disease (STD) testing.  Diabetes screening. This is done by checking your blood sugar (glucose) after you have not eaten for a while (fasting). You may have this done every 1-3 years.  Mammogram. This may be done every 1-2 years. Talk to your health care provider about when you should start having regular mammograms. This may depend on whether you have a family history of breast cancer.  BRCA-related cancer screening. This may be done if you have a family history of breast, ovarian, tubal, or peritoneal cancers.  Pelvic exam and Pap test. This may be done every 3 years starting at age 44. Starting at age 19, this may be done every 5 years if you have a Pap test in combination with an  HPV test.  Bone density scan. This is done to screen for osteoporosis. You may have this scan if you are at high risk for osteoporosis. Discuss your test results, treatment options, and if necessary, the need for more tests with your health care  provider. Vaccines  Your health care provider may recommend certain vaccines, such as:  Influenza vaccine. This is recommended every year.  Tetanus, diphtheria, and acellular pertussis (Tdap, Td) vaccine. You may need a Td booster every 10 years.  Zoster vaccine. You may need this after age 32.  Pneumococcal 13-valent conjugate (PCV13) vaccine. You may need this if you have certain conditions and were not previously vaccinated.  Pneumococcal polysaccharide (PPSV23) vaccine. You may need one or two doses if you smoke cigarettes or if you have certain conditions. Talk to your health care provider about which screenings and vaccines you need and how often you need them. This information is not intended to replace advice given to you by your health care provider. Make sure you discuss any questions you have with your health care provider. Document Released: 09/08/2015 Document Revised: 05/01/2016 Document Reviewed: 06/13/2015 Elsevier Interactive Patient Education  2017 [place] Prevention in the Home Falls can cause injuries. They can happen to people of all ages. There are many things you can do to make your home safe and to help prevent falls. What can I do on the outside of my home?  Regularly fix the edges of walkways and driveways and fix any cracks.  Remove anything that might make you trip as you walk through a door, such as a raised step or threshold.  Trim any bushes or trees on the path to your home.  Use bright outdoor lighting.  Clear any walking paths of anything that might make someone trip, such as rocks or tools.  Regularly check to see if handrails are loose or broken. Make sure that both sides of any steps have handrails.  Any raised decks and porches should have guardrails on the edges.  Have any leaves, snow, or ice cleared regularly.  Use sand or salt on walking paths during winter.  Clean up any spills in your garage right away. This includes  oil or grease spills. What can I do in the bathroom?  Use night lights.  Install grab bars by the toilet and in the tub and shower. Do not use towel bars as grab bars.  Use non-skid mats or decals in the tub or shower.  If you need to sit down in the shower, use a plastic, non-slip stool.  Keep the floor dry. Clean up any water that spills on the floor as soon as it happens.  Remove soap buildup in the tub or shower regularly.  Attach bath mats securely with double-sided non-slip rug tape.  Do not have throw rugs and other things on the floor that can make you trip. What can I do in the bedroom?  Use night lights.  Make sure that you have a light by your bed that is easy to reach.  Do not use any sheets or blankets that are too big for your bed. They should not hang down onto the floor.  Have a firm chair that has side arms. You can use this for support while you get dressed.  Do not have throw rugs and other things on the floor that can make you trip. What can I do in the kitchen?  Clean up any spills right away.  Avoid walking on wet floors.  Keep items that you use a lot in easy-to-reach places.  If you need to reach something above you, use a strong step stool that has a grab bar.  Keep electrical cords out of the way.  Do not use floor polish or wax that makes floors slippery. If you must use wax, use non-skid floor wax.  Do not have throw rugs and other things on the floor that can make you trip. What can I do with my stairs?  Do not leave any items on the stairs.  Make sure that there are handrails on both sides of the stairs and use them. Fix handrails that are broken or loose. Make sure that handrails are as long as the stairways.  Check any carpeting to make sure that it is firmly attached to the stairs. Fix any carpet that is loose or worn.  Avoid having throw rugs at the top or bottom of the stairs. If you do have throw rugs, attach them to the floor  with carpet tape.  Make sure that you have a light switch at the top of the stairs and the bottom of the stairs. If you do not have them, ask someone to add them for you. What else can I do to help prevent falls?  Wear shoes that:  Do not have high heels.  Have rubber bottoms.  Are comfortable and fit you well.  Are closed at the toe. Do not wear sandals.  If you use a stepladder:  Make sure that it is fully opened. Do not climb a closed stepladder.  Make sure that both sides of the stepladder are locked into place.  Ask someone to hold it for you, if possible.  Clearly mark and make sure that you can see:  Any grab bars or handrails.  First and last steps.  Where the edge of each step is.  Use tools that help you move around (mobility aids) if they are needed. These include:  Canes.  Walkers.  Scooters.  Crutches.  Turn on the lights when you go into a dark area. Replace any light bulbs as soon as they burn out.  Set up your furniture so you have a clear path. Avoid moving your furniture around.  If any of your floors are uneven, fix them.  If there are any pets around you, be aware of where they are.  Review your medicines with your doctor. Some medicines can make you feel dizzy. This can increase your chance of falling. Ask your doctor what other things that you can do to help prevent falls. This information is not intended to replace advice given to you by your health care provider. Make sure you discuss any questions you have with your health care provider. Document Released: 06/08/2009 Document Revised: 01/18/2016 Document Reviewed: 09/16/2014 Elsevier Interactive Patient Education  2017 [employer].

## 2020-01-31 DIAGNOSIS — J449 Chronic obstructive pulmonary disease, unspecified: Secondary | ICD-10-CM | POA: Diagnosis not present

## 2020-01-31 NOTE — Progress Notes (Addendum)
Cognitive function: Patient is cogitatively intact.

## 2020-02-04 ENCOUNTER — Other Ambulatory Visit: Payer: Self-pay | Admitting: Pulmonary Disease

## 2020-02-04 DIAGNOSIS — G4733 Obstructive sleep apnea (adult) (pediatric): Secondary | ICD-10-CM

## 2020-02-04 DIAGNOSIS — I10 Essential (primary) hypertension: Secondary | ICD-10-CM

## 2020-02-10 DIAGNOSIS — J449 Chronic obstructive pulmonary disease, unspecified: Secondary | ICD-10-CM | POA: Diagnosis not present

## 2020-02-14 ENCOUNTER — Other Ambulatory Visit: Payer: Self-pay

## 2020-02-14 ENCOUNTER — Ambulatory Visit: Payer: Medicare HMO | Admitting: Pharmacist

## 2020-02-14 DIAGNOSIS — E782 Mixed hyperlipidemia: Secondary | ICD-10-CM

## 2020-02-14 DIAGNOSIS — IMO0002 Reserved for concepts with insufficient information to code with codable children: Secondary | ICD-10-CM

## 2020-02-14 DIAGNOSIS — M545 Low back pain, unspecified: Secondary | ICD-10-CM

## 2020-02-14 DIAGNOSIS — I10 Essential (primary) hypertension: Secondary | ICD-10-CM

## 2020-02-14 DIAGNOSIS — I5032 Chronic diastolic (congestive) heart failure: Secondary | ICD-10-CM

## 2020-02-14 NOTE — Chronic Care Management (AMB) (Signed)
Chronic Care Management Pharmacy  Name: Mary Acosta  MRN: [id number] DOB: [birth year]  Chief Complaint/ HPI  Mary Acosta,  64 y.o. , female presents for their Follow-Up CCM visit with the clinical pharmacist via telephone due to COVID-19 Pandemic.  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  Office Visits: 01/21/20 Dr Quay Burow VV: acute cold sx. Start doxycycline, continue inhalers, otc medications.  10/22/19 Dr Quay Burow OV: anxiety/depression related to grandson's death, managed per psych. DM uncontrolled d/t inability to afford insulin. Changed to Lantus.  07/20/19 Dr Quay Burow OV: hospital f/u, no med changes. Hold off statin d/t cirrhosis.  06/16/19 Dr Quay Burow VV: sugars better controlled, mgmt per Dr Loanne Drilling. Struggling with grief, talking to therapist and meds managed per psych.  Consult Visit: 01/12/20 Dr Elsworth Soho (pulmonary): OSA - failed CPAP. Rec'd sleep upright on wedge pillow, wt loss. Rx'd furosemide 40 mg x 1 month, PCP for further refills.  01/11/20 [facility] neurosurgery - L knee genicular nerve block.  12/09/19 NP Cleaver (cardiology):  08/10/19 Dr Loanne Drilling (endocrine): change Lantus to Toujeo 180 units qAM and apply for PAP. Check BG BID  07/06/19-07/07/19 Hospital admission: atypical chest/back pain, resolved spontaneously. Rec' close f/u with PCP and consider increasing diuretic doses.   06/23/19 Dr Rosario Jacks (GI): liver lesion on [place], too small for characterization. Cirrhosis (compensated), splenomegaly stable, rec'd restart furosemide daily - pt was previously not taking d/t fear of low K.   05/17/19 NP Cleaver (cardiology): continue same meds (including atorvastatin)   Allergies  Allergen Reactions  . Gabapentin Swelling    Swelling in legs Swelling in  legs Swelling in legs  . Losartan Other (See Comments)    Myalgias and muscle cramping Other reaction(s): Other (See Comments) Myalgias and muscle cramping Myalgias and muscle cramping Other reaction(s): Other (See Comments) Myalgias and muscle cramping  . Aricept [Donepezil Hcl]     Nausea/vomiting, low BP  . Oxycodone Itching  . Sulfa Antibiotics Nausea Only and Rash  . Sulfonamide Derivatives Nausea Only    Medications: Outpatient Encounter Medications as of 02/14/2020  Medication Sig Note  . acetaminophen (TYLENOL) 500 MG tablet Take 1,000 mg by mouth 2 (two) times daily.   Marland Kitchen albuterol (VENTOLIN HFA) 108 (90 Base) MCG/ACT inhaler TAKE 2 PUFFS BY MOUTH EVERY 6 HOURS AS NEEDED FOR WHEEZE OR SHORTNESS OF BREATH (Patient taking differently: Inhale 2 puffs into the lungs every 6 (six) hours as needed for wheezing or shortness of breath. )   . amitriptyline (ELAVIL) 100 MG tablet Take 100 mg by mouth at bedtime.   Marland Kitchen amLODipine (NORVASC) 5 MG tablet TAKE 1 TABLET BY MOUTH TWICE A DAY   . aspirin EC 81 MG tablet Take 1 tablet (81 mg total) by mouth daily.   . furosemide (LASIX) 40 MG tablet Take 1 tablet (40 mg total) by mouth daily. 02/14/2020: Takes PRN  . gabapentin (NEURONTIN) 300 MG capsule Take 300 mg by mouth 3 (three) times daily.    . hydrALAZINE (APRESOLINE) 100 MG tablet Take 1 tablet (100 mg total) by mouth 3 (three) times daily. (Patient taking differently: Take 100 mg by mouth 2 (two) times daily. )   . hydrOXYzine (ATARAX/VISTARIL) 25 MG tablet Take 1 tablet (25 mg total) by mouth 3 (three) times daily as needed for itching.   . insulin glargine (  LANTUS) 100 UNIT/ML injection Inject 1.8 mLs (180 Units total) into the skin daily. (Patient taking differently: Inject 75 Units into the skin 2 (two) times daily. ) 02/14/2020: Basaglar via [place] PAP  . Insulin Pen Needle (PEN NEEDLES) 31G X 8 MM MISC 1 Device by Does not apply route daily.   . Insulin Syringe-Needle U-100 26G X  1/2" 1 ML MISC Use daily for insulin injection as directed   . ipratropium-albuterol (DUONEB) 0.5-2.5 (3) MG/3ML SOLN Take 3 mLs by nebulization every 6 (six) hours as needed (Wheezing or dyspnea.).   Marland Kitchen levothyroxine (SYNTHROID) 175 MCG tablet TAKE 1 TABLET BY MOUTH SIX DAYS A WEEK BEFORE BREAKFAST   . metoprolol tartrate (LOPRESSOR) 25 MG tablet Take 1 tablet (25 mg total) by mouth 2 (two) times daily.   Marland Kitchen morphine (MS CONTIN) 30 MG 12 hr tablet Take 30 mg by mouth 2 (two) times daily.   . ondansetron (ZOFRAN-ODT) 4 MG disintegrating tablet Take 1 tablet (4 mg total) by mouth every 6 (six) hours as needed for nausea or vomiting.   Marland Kitchen PARoxetine (PAXIL) 20 MG tablet paroxetine 20 mg tablet  TAKE 1 TABLET BY MOUTH EVERY DAY   . potassium chloride SA (KLOR-CON M20) 20 MEQ tablet TAKE 2 TABLETS EVERY DAY AS NEEDED FOR CRAMPING   . spironolactone (ALDACTONE) 25 MG tablet TAKE 1 TABLET (25 MG TOTAL) BY MOUTH DAILY. CAUSES PAIN ONLY TAKES WHEN BP IS ELEVATED   . tiZANidine (ZANAFLEX) 2 MG tablet Take 2 mg by mouth as needed for muscle spasms.    Marland Kitchen doxycycline (VIBRA-TABS) 100 MG tablet Take 1 tablet (100 mg total) by mouth 2 (two) times daily.   Marland Kitchen ezetimibe (ZETIA) 10 MG tablet Take 1 tablet (10 mg total) by mouth daily. (Patient not taking: Reported on 02/14/2020) 02/14/2020: Non-preferred $100 copay  . hydrALAZINE (APRESOLINE) 25 MG tablet TAKE 1 TABLET BY MOUTH THREE TIMES A DAY (Patient not taking: Reported on 02/14/2020)   . mirtazapine (REMERON) 45 MG tablet mirtazapine 45 mg tablet  TAKE 1 TABLET BY MOUTH EVERYDAY AT BEDTIME (Patient not taking: Reported on 02/14/2020) 02/14/2020: Stopped taking due to appetite side effect   No facility-administered encounter medications on file as of 02/14/2020.     Current Diagnosis/Assessment:  SDOH Interventions     Most Recent Value  SDOH Interventions  Financial Strain Interventions --  [Basaglar PAP, tier exception for amitriptyline, celecoxib, ezetimibe]       Goals Addressed            This Visit's Progress   . COMPLETED: Medication assistance: insulin       CARE PLAN ENTRY (see longitudinal plan of care for additional care plan information)  Current Barriers:  . Financial Barriers: patient has [employer] and reports copay for Nancee Liter is cost prohibitive at this time  Pharmacist Clinical Goal(s):  Marland Kitchen Over the next 30 days, patient will work with PharmD and providers to relieve medication access concerns  Interventions: . Comprehensive medication review completed; medication list updated in electronic medical record.  Bertram Savin care team collaboration (see longitudinal plan of care) . Basaglar by Orson Ape: Patient meets income/out of pocket spend criteria for this medication's patient assistance program. Reviewed application process. Patient will provide proof of income, out of pocket spend report, and will sign application. Will collaborate with primary care provider Dr Quay Burow for their portion of application. Once completed, will submit to [employer] patient assistance program.  Patient Self Care Activities:  .  Patient will provide necessary portions of application   Initial goal documentation     . Pharmacy Care Plan   On track    CARE PLAN ENTRY (see longitudinal plan of care for additional care plan information)  Current Barriers:  . Chronic Disease Management support, education, and care coordination needs related to Hypertension, Hyperlipidemia, Diabetes, Heart Failure, Depression, and Anxiety   Hypertension BP Readings from Last 3 Encounters:  01/28/20 (!) 140/100  01/12/20 126/83  12/09/19 (!) 90/50 .  Pharmacist Clinical Goal(s): o Over the next 60 days, patient will work with PharmD and providers to achieve BP goal <130/80 . Current regimen:  o amlodipine 5 mg twice a day o spironolactone 25 mg daily,  o Hydralazine 100 mg twice a day o metoprolol tartrate 25 mg twice a day o Furosemide 20  mg as needed for swelling . Interventions: o Discussed benefits of medications and importance of adherence  . Patient self care activities - Over the next 60 days, patient will: o Check BP daily, document, and provide at future appointments o Ensure daily salt intake < 2300 mg/day  Hyperlipidemia Lab Results  Component Value Date/Time   LDLCALC 114 (H) 10/22/2019 02:11 PM .  Pharmacist Clinical Goal(s): o Over the next 60 days, patient will work with PharmD and providers to achieve LDL goal < 100 . Current regimen:  o Ezetimibe 10 mg daily - not taking due to cost . Interventions: o Discussed cholesterol goals and benefits of medications for prevention of heart attack / stroke o Tier exception approved for Ezetimibe - was Tier 4 ($100/month), now will be Tier 1 ($0 copay). . Patient self care activities - Over the next 60 days, patient will: o Continue low cholesterol diet o Start ezetimibe 10 mg daily  Diabetes Lab Results  Component Value Date/Time   HGBA1C 11.0 (H) 10/22/2019 02:11 PM   HGBA1C 9.4 (H) 07/20/2019 01:51 PM .  Pharmacist Clinical Goal(s): o Over the next 60 days, patient will work with PharmD and providers to achieve A1c goal <7% and fasting blood sugar 80-130 . Current regimen:  o Basaglar 75 unit twice a day . Interventions: o Discussed A1c goal and benefits of maintaining blood sugar at goal o Basaglar approved for free via [employer] patient assistance through end of 2021 . Patient self care activities - Over the next 60 days, patient will: o Check blood sugar twice daily, document, and provide at future appointments o Contact provider with any episodes of hypoglycemia  Chronic pain / Arthritis . Pharmacist Clinical Goal(s) o Over the next 60 days, patient will work with PharmD and providers to optimize therapy and reduce cost . Current regimen:  o morphine ER 30 mg every 12 hours o celecoxib 100 mg twice a day o tylenol 1000 mg twice a  day o tizanidine 2 mg every 6 hrs as needed o Gabapentin 300 mg 3 times daily as needed . Interventions: o Recommended to use Good[facility] gabapentin [facility], 30 day supply would be ~$15 . Patient self care activities - Over the next 60 days, patient will: o Use [facility]entin [facility]  Medication management . Pharmacist Clinical Goal(s): o Over the next 60 days, patient will work with PharmD and providers to achieve optimal medication adherence . [facility] . Interventions o Comprehensive medication review performed. o Utilize [facility] for medication synchronization, packaging and delivery . Patient self care activities - Over the next 60 days, patient will:  o Focus on medication adherence by pill pack o Take medications as prescribed o Report any questions or concerns to PharmD and/or provider(s)  Please see past updates related to this goal by clicking on the "Past Updates" button in the selected goal        Diabetes (Type 1)   Recent Relevant Labs: Lab Results  Component Value Date/Time   HGBA1C 11.0 (H) 10/22/2019 02:11 PM   HGBA1C 9.4 (H) 07/20/2019 01:51 PM   MICROALBUR <0.7 09/19/2017 02:26 PM   MICROALBUR 0.8 02/05/2017 11:09 AM    Checking BG: 2x per Day   Fasting BG: 130s  Last diabetic Foot exam:  Lab Results  Component Value Date/Time   HMDIABEYEEXA No Retinopathy 05/29/2016 12:00 AM    Last diabetic Eye exam: No results found for: HMDIABFOOTEX   Patient has failed these meds in past: Toujeo, NPH, Basaglar, Lantus Patient is currently uncontrolled on the following medications:   Basaglar 75 units BID  We discussed: Basaglar PAP approved through end of 2021. Pt reports BG much improved now that she has steady insulin supply.  Plan  Continue current medications  Hypertension / Diastolic HF   HF type: Diastolic Last ejection fraction: 60-65% (06/2019)  BP goal < 130/80  Office blood pressures are  BP Readings  from Last 3 Encounters:  01/28/20 (!) 140/100  01/12/20 126/83  12/09/19 (!) 90/50   Kidney Function Lab Results  Component Value Date/Time   CREATININE 0.98 10/22/2019 02:11 PM   CREATININE 0.87 07/20/2019 01:51 PM   GFR 57.25 (L) 10/22/2019 02:11 PM   GFRNONAA >60 07/06/2019 07:22 PM   GFRAA >60 07/06/2019 07:22 PM   K 4.8 10/22/2019 02:11 PM   K 4.2 07/20/2019 01:51 PM   Patient has failed these meds in the past: HCTZ, valsartan, losartan, lisinopril Patient is currently uncontrolled on the following medications:   amlodipine 5 mg BID,   spironolactone 25 mg daily,   hydralazine 100 mg BID  metoprolol tartrate 25 mg BID,   Furosemide 40 mg as needed  potassium 40 mEq PRN (cramping)  Patient checks BP at home: daily  Patient home BP readings are ranging: SBP 120s-140s  We discussed: Pt is taking medications as prescribed and BP is fairly well controlled.  Plan  Continue current medications    Hyperlipidemia / CAD   LDL goal < 70  Lipid Panel     Component Value Date/Time   CHOL 187 10/22/2019 1411   TRIG 179.0 (H) 10/22/2019 1411   HDL 37.10 (L) 10/22/2019 1411   CHOLHDL 5 10/22/2019 1411   VLDL 35.8 10/22/2019 1411   LDLCALC 114 (H) 10/22/2019 1411    The ASCVD Risk score (Goff DC Jr., et al., 2013) failed to calculate for the following reasons:   The patient has a prior MI or stroke diagnosis  Patient has failed these meds in past: statins Patient is currently uncontrolled on the following medications:   aspirin 81 mg daily  ezetimibe 10 mg daily  We discussed:  no statin d/t cirrhosis per PCP; recently prescribed ezetimibe by cardiologist, however pt did not start due to cost (Tier 4, $100/month). I called [employer] for tier exception, which was approved for Tier 1 or $0 copay.  Plan  Start ezetimibe 10 mg as prescribed by cardiologist (tier exception approved)  Chronic respiratory failure/Reactive airway disease   Last spirometry score:  04/02/2018: FEV1 74% predicted, FEV1/FVC 0.82  Eosinophil count:   Lab Results  Component Value Date/Time  EOSPCT 2.7 10/22/2019 02:11 PM  %                               Eos (Absolute):  Lab Results  Component Value Date/Time   EOSABS 0.2 10/22/2019 02:11 PM   Tobacco Status:  Social History   Tobacco Use  Smoking Status Former Smoker  . Packs/day: 2.50  . Years: 39.00  . Pack years: 97.50  . Types: Cigarettes  . Quit date: 10/22/2010  . Years since quitting: 9.3  Smokeless Tobacco Never Used   Patient has failed these meds in past: Spriva Patient is currently controlled on the following medications:   Duoneb q6h prn,   albuterol HFA prn  Using maintenance inhaler regularly? No Frequency of rescue inhaler use:  prn  We discussed:  Pt reports she does not use inhalers often  Plan  Continue current medications  Hypothyroidism   Lab Results  Component Value Date/Time   TSH 1.50 10/22/2019 02:11 PM   TSH 1.09 07/20/2019 01:51 PM   Patient has failed these meds in past: n/a Patient is currently controlled on the following medications:   levothyroxine 175 mcg daily - early AM  We discussed:  Pt takes levothyroxine separate from all other meds in early AM, denies s/sx of hypo- or hyperthyroidism  Plan  Continue current medications   Depression/anxiety   Patient has failed these meds in past: sertraline, Pristiq, buspirone. Hydroxyzine, alprazolam, duloxetine, diazepam, mirtazapine Patient is currently controlled on the following medications:   paroxetine 20 mg - PM  amitriptyline 100 mg HS  We discussed: Pt reports good control over mood and bladder spasms with this combination. She stopped taking mirtazapine due to increase in appetite, and denies issues since stopping it.    Plan  Continue current medications    Chronic pain/ arthritis/ fibromyalgia   Patient has failed these meds in past: gabapentin, meloxicam Patient is currently  uncontrolled on the following medications:   morphine ER 30 mg q12h,   celecoxib 100 mg BID,   tylenol 1000 mg BID,   tizanidine 2 mg PRN  Gabapentin 300 mg TID prn   Hydroxyzine 25 mg PRN itching  We discussed: pt is trying to wean off of morphine, using NSAIDs to help with pain. She is going to talk to pain clinic about reducing her morphine to 15 mg.  Celebrex tier exception was approved through end of year 2021. Gabapentin causes leg swelling so she tries to limit use. She reports gabapentin was ~$100 at pharmacy, with [facility]  Plan  Continue current medications  Use GoodRx coupon for gabapentin refills   Medication Management   [facility] Pt does not use pill box Pt endorses compliance with medications as prescribed  We discussed:  Verbal consent obtained for [facility] enhanced pharmacy services (medication synchronization, adherence packaging, delivery coordination). A medication sync plan was created to allow patient to get all medications delivered once every 30 to 90 days per patient preference. Patient understands they have freedom to choose pharmacy and clinical pharmacist will coordinate care between all prescribers and [facility].  Plan  Utilize [facility] for medication synchronization, packaging and delivery    Follow up: 2 month phone visit  Charlene Brooke, PharmD Clinical Pharmacist [facility] [phone]

## 2020-02-14 NOTE — Patient Instructions (Addendum)
Visit Information  Phone number for Pharmacist: [phone]  Goals Addressed            This Visit's Progress   . Pharmacy Care Plan   On track    CARE PLAN ENTRY (see longitudinal plan of care for additional care plan information)  Current Barriers:  . Chronic Disease Management support, education, and care coordination needs related to Hypertension, Hyperlipidemia, Diabetes, Heart Failure, Chronic pain   Hypertension BP Readings from Last 3 Encounters:  01/28/20 (!) 140/100  01/12/20 126/83  12/09/19 (!) 90/50 .  Pharmacist Clinical Goal(s): o Over the next 60 days, patient will work with PharmD and providers to achieve BP goal <130/80 . Current regimen:  o amlodipine 5 mg twice a day o spironolactone 25 mg daily,  o Hydralazine 100 mg twice a day o metoprolol tartrate 25 mg twice a day o Furosemide 20 mg as needed for swelling . Interventions: o Discussed benefits of medications and importance of adherence  . Patient self care activities - Over the next 60 days, patient will: o Check BP daily, document, and provide at future appointments o Ensure daily salt intake < 2300 mg/day  Hyperlipidemia Lab Results  Component Value Date/Time   LDLCALC 114 (H) 10/22/2019 02:11 PM .  Pharmacist Clinical Goal(s): o Over the next 60 days, patient will work with PharmD and providers to achieve LDL goal < 100 . Current regimen:  o Ezetimibe 10 mg daily - not taking due to cost . Interventions: o Discussed cholesterol goals and benefits of medications for prevention of heart attack / stroke o Tier exception approved for Ezetimibe - was Tier 4 ($100/month), now will be Tier 1 ($0 copay). . Patient self care activities - Over the next 60 days, patient will: o Continue low cholesterol diet o Start ezetimibe 10 mg daily  Diabetes Lab Results  Component Value Date/Time   HGBA1C 11.0 (H) 10/22/2019 02:11 PM   HGBA1C 9.4 (H) 07/20/2019 01:51 PM .  Pharmacist Clinical  Goal(s): o Over the next 60 days, patient will work with PharmD and providers to achieve A1c goal <7% and fasting blood sugar 80-130 . Current regimen:  o Basaglar 75 unit twice a day . Interventions: o Discussed A1c goal and benefits of maintaining blood sugar at goal o Basaglar approved for free via [employer] patient assistance through end of 2021 . Patient self care activities - Over the next 60 days, patient will: o Check blood sugar twice daily, document, and provide at future appointments o Contact provider with any episodes of hypoglycemia  Chronic pain / Arthritis . Pharmacist Clinical Goal(s) o Over the next 60 days, patient will work with PharmD and providers to optimize therapy and reduce cost . Current regimen:  o morphine ER 30 mg every 12 hours o celecoxib 100 mg twice a day o tylenol 1000 mg twice a day o tizanidine 2 mg every 6 hrs as needed o Gabapentin 300 mg 3 times daily as needed . Interventions: o Recommended to use GoodRx for gabapentin - at [facility], 30 day supply would be ~$15 . Patient self care activities - Over the next 60 days, patient will: o Use GoodRx for gabapentin savings at [facility]  Medication management . Pharmacist Clinical Goal(s): o Over the next 60 days, patient will work with PharmD and providers to achieve optimal medication adherence . Current pharmacy: [facility] . Interventions o Comprehensive medication review performed. o Utilize [facility] for medication synchronization, packaging and delivery . Patient self care  activities - Over the next 60 days, patient will: o Focus on medication adherence by pill pack o Take medications as prescribed o Report any questions or concerns to PharmD and/or provider(s)  Please see past updates related to this goal by clicking on the "Past Updates" button in the selected goal       The patient verbalized understanding of instructions provided today and agreed to receive a mailed copy of patient  instruction and/or educational materials.  Telephone follow up appointment with pharmacy team member scheduled for: 2 months  Charlene Brooke, PharmD Clinical Pharmacist [facility] [phone]  Heart-Healthy Eating Plan Many factors influence your heart (coronary) health, including eating and exercise habits. Coronary risk increases with abnormal blood fat (lipid) levels. Heart-healthy meal planning includes limiting unhealthy fats, increasing healthy fats, and making other diet and lifestyle changes. What are tips for following this plan? Cooking Cook foods using methods other than frying. Baking, boiling, grilling, and broiling are all good options. Other ways to reduce fat include:  Removing the skin from poultry.  Removing all visible fats from meats.  Steaming vegetables in water or broth. Meal planning   At meals, imagine dividing your plate into fourths: ? Fill one-half of your plate with vegetables and green salads. ? Fill one-fourth of your plate with whole grains. ? Fill one-fourth of your plate with lean protein foods.  Eat 4-5 servings of vegetables per day. One serving equals 1 cup raw or cooked vegetable, or 2 cups raw leafy greens.  Eat 4-5 servings of fruit per day. One serving equals 1 medium whole fruit,  cup dried fruit,  cup fresh, frozen, or canned fruit, or  cup 100% fruit juice.  Eat more foods that contain soluble fiber. Examples include apples, broccoli, carrots, beans, peas, and barley. Aim to get 25-30 g of fiber per day.  Increase your consumption of legumes, nuts, and seeds to 4-5 servings per week. One serving of dried beans or legumes equals  cup cooked, 1 serving of nuts is  cup, and 1 serving of seeds equals 1 tablespoon. Fats  Choose healthy fats more often. Choose monounsaturated and polyunsaturated fats, such as olive and canola oils, flaxseeds, walnuts, almonds, and seeds.  Eat more omega-3 fats. Choose  salmon, mackerel, sardines, tuna, flaxseed oil, and ground flaxseeds. Aim to eat fish at least 2 times each week.  Check food labels carefully to identify foods with trans fats or high amounts of saturated fat.  Limit saturated fats. These are found in animal products, such as meats, butter, and cream. Plant sources of saturated fats include palm oil, palm kernel oil, and coconut oil.  Avoid foods with partially hydrogenated oils in them. These contain trans fats. Examples are stick margarine, some tub margarines, cookies, crackers, and other baked goods.  Avoid fried foods. General information  Eat more home-cooked food and less restaurant, buffet, and fast food.  Limit or avoid alcohol.  Limit foods that are high in starch and sugar.  Lose weight if you are overweight. Losing just 5-10% of your body weight can help your overall health and prevent diseases such as diabetes and heart disease.  Monitor your salt (sodium) intake, especially if you have high blood pressure. Talk with your health care provider about your sodium intake.  Try to incorporate more vegetarian meals weekly. What foods can I eat? Fruits All fresh, canned (in natural juice), or frozen fruits. Vegetables Fresh or frozen vegetables (raw, steamed, roasted, or grilled).  Green salads. Grains Most grains. Choose whole wheat and whole grains most of the time. Rice and pasta, including brown rice and pastas made with whole wheat. Meats and other proteins Lean, well-trimmed beef, veal, pork, and lamb. Chicken and [place] without skin. All fish and shellfish. Wild duck, rabbit, pheasant, and venison. Egg whites or low-cholesterol egg substitutes. Dried beans, peas, lentils, and tofu. Seeds and most nuts. Dairy Low-fat or nonfat cheeses, including ricotta and mozzarella. Skim or 1% milk (liquid, powdered, or evaporated). Buttermilk made with low-fat milk. Nonfat or low-fat yogurt. Fats and oils Non-hydrogenated  (trans-free) margarines. Vegetable oils, including soybean, sesame, sunflower, olive, peanut, safflower, corn, canola, and cottonseed. Salad dressings or mayonnaise made with a vegetable oil. Beverages Water (mineral or sparkling). Coffee and tea. Diet carbonated beverages. Sweets and desserts Sherbet, gelatin, and fruit ice. Small amounts of dark chocolate. Limit all sweets and desserts. Seasonings and condiments All seasonings and condiments. The items listed above may not be a complete list of foods and beverages you can eat. Contact a dietitian for more options. What foods are not recommended? Fruits Canned fruit in heavy syrup. Fruit in cream or butter sauce. Fried fruit. Limit coconut. Vegetables Vegetables cooked in cheese, cream, or butter sauce. Fried vegetables. Grains Breads made with saturated or trans fats, oils, or whole milk. Croissants. Sweet rolls. Donuts. High-fat crackers, such as cheese crackers. Meats and other proteins Fatty meats, such as hot dogs, ribs, sausage, bacon, rib-eye roast or steak. High-fat deli meats, such as salami and bologna. Caviar. Domestic duck and goose. Organ meats, such as liver. Dairy Cream, sour cream, cream cheese, and creamed cottage cheese. Whole milk cheeses. Whole or 2% milk (liquid, evaporated, or condensed). Whole buttermilk. Cream sauce or high-fat cheese sauce. Whole-milk yogurt. Fats and oils Meat fat, or shortening. Cocoa butter, hydrogenated oils, palm oil, coconut oil, palm kernel oil. Solid fats and shortenings, including bacon fat, salt pork, lard, and butter. Nondairy cream substitutes. Salad dressings with cheese or sour cream. Beverages Regular sodas and any drinks with added sugar. Sweets and desserts Frosting. Pudding. Cookies. Cakes. Pies. Milk chocolate or white chocolate. Buttered syrups. Full-fat ice cream or ice cream drinks. The items listed above may not be a complete list of foods and beverages to avoid. Contact a  dietitian for more information. Summary  Heart-healthy meal planning includes limiting unhealthy fats, increasing healthy fats, and making other diet and lifestyle changes.  Lose weight if you are overweight. Losing just 5-10% of your body weight can help your overall health and prevent diseases such as diabetes and heart disease.  Focus on eating a balance of foods, including fruits and vegetables, low-fat or nonfat dairy, lean protein, nuts and legumes, whole grains, and heart-healthy oils and fats. This information is not intended to replace advice given to you by your health care provider. Make sure you discuss any questions you have with your health care provider. Document Revised: 09/19/2017 Document Reviewed: 09/19/2017 Elsevier Patient Education  2020 [employer].

## 2020-02-15 ENCOUNTER — Other Ambulatory Visit: Payer: Self-pay | Admitting: Cardiology

## 2020-02-15 DIAGNOSIS — R69 Illness, unspecified: Secondary | ICD-10-CM | POA: Diagnosis not present

## 2020-02-15 DIAGNOSIS — E039 Hypothyroidism, unspecified: Secondary | ICD-10-CM | POA: Diagnosis not present

## 2020-02-15 DIAGNOSIS — Z794 Long term (current) use of insulin: Secondary | ICD-10-CM | POA: Diagnosis not present

## 2020-02-15 DIAGNOSIS — E1142 Type 2 diabetes mellitus with diabetic polyneuropathy: Secondary | ICD-10-CM | POA: Diagnosis not present

## 2020-02-15 DIAGNOSIS — I110 Hypertensive heart disease with heart failure: Secondary | ICD-10-CM | POA: Diagnosis not present

## 2020-02-15 DIAGNOSIS — E261 Secondary hyperaldosteronism: Secondary | ICD-10-CM | POA: Diagnosis not present

## 2020-02-15 DIAGNOSIS — Z008 Encounter for other general examination: Secondary | ICD-10-CM | POA: Diagnosis not present

## 2020-02-15 DIAGNOSIS — G8929 Other chronic pain: Secondary | ICD-10-CM | POA: Diagnosis not present

## 2020-02-15 DIAGNOSIS — I509 Heart failure, unspecified: Secondary | ICD-10-CM | POA: Diagnosis not present

## 2020-02-15 MED ORDER — EZETIMIBE 10 MG PO TABS: 10.0000 mg | ORAL_TABLET | Freq: Every day | ORAL | 1 refills | Status: DC

## 2020-02-15 MED ORDER — METOPROLOL TARTRATE 25 MG PO TABS: 25.0000 mg | ORAL_TABLET | Freq: Two times a day (BID) | ORAL | 3 refills | Status: DC

## 2020-02-15 NOTE — Telephone Encounter (Signed)
New message   Patient needs a new prescription for metoprolol tartrate (LOPRESSOR) 25 MG tablet and ezetimibe (ZETIA) 10 MG tablet sent to [facility].

## 2020-02-15 NOTE — Telephone Encounter (Signed)
LM2CB for [employer] pt she states that she is unable to afford rx for Zetia. She states that [facility] is trying to fix this and find some sort of pt assistance for pt. Pt states that she will come in fasting for lipid panel and other medications. Will await to see what kind of help that [facility] can get for pt. Otherwise we will need to change medication to see what pt can afford. Will await CB from [facility]

## 2020-02-16 ENCOUNTER — Other Ambulatory Visit: Payer: Self-pay | Admitting: Internal Medicine

## 2020-02-16 ENCOUNTER — Other Ambulatory Visit: Payer: Self-pay

## 2020-02-16 DIAGNOSIS — G4733 Obstructive sleep apnea (adult) (pediatric): Secondary | ICD-10-CM

## 2020-02-16 DIAGNOSIS — I10 Essential (primary) hypertension: Secondary | ICD-10-CM

## 2020-02-16 MED ORDER — AMLODIPINE BESYLATE 5 MG PO TABS: 5.0000 mg | ORAL_TABLET | Freq: Two times a day (BID) | ORAL | 1 refills | Status: DC

## 2020-02-16 MED ORDER — FUROSEMIDE 40 MG PO TABS: 40.0000 mg | ORAL_TABLET | Freq: Every day | ORAL | 0 refills | Status: DC

## 2020-02-16 MED ORDER — POTASSIUM CHLORIDE CRYS ER 20 MEQ PO TBCR: EXTENDED_RELEASE_TABLET | ORAL | 1 refills | Status: DC

## 2020-02-16 MED ORDER — SPIRONOLACTONE 25 MG PO TABS: 25.0000 mg | ORAL_TABLET | Freq: Every day | ORAL | 0 refills | Status: DC

## 2020-02-16 MED ORDER — HYDRALAZINE HCL 100 MG PO TABS
100.0000 mg | ORAL_TABLET | Freq: Two times a day (BID) | ORAL | 1 refills | Status: DC
Start: 2020-02-16 — End: 2020-08-24

## 2020-02-16 MED ORDER — LEVOTHYROXINE SODIUM 175 MCG PO TABS: ORAL_TABLET | ORAL | 1 refills | Status: DC

## 2020-02-17 ENCOUNTER — Telehealth: Payer: Self-pay | Admitting: Pharmacist

## 2020-02-17 DIAGNOSIS — I2510 Atherosclerotic heart disease of native coronary artery without angina pectoris: Secondary | ICD-10-CM | POA: Diagnosis not present

## 2020-02-17 DIAGNOSIS — E7800 Pure hypercholesterolemia, unspecified: Secondary | ICD-10-CM | POA: Diagnosis not present

## 2020-02-17 DIAGNOSIS — I10 Essential (primary) hypertension: Secondary | ICD-10-CM | POA: Diagnosis not present

## 2020-02-17 DIAGNOSIS — I5032 Chronic diastolic (congestive) heart failure: Secondary | ICD-10-CM | POA: Diagnosis not present

## 2020-02-17 DIAGNOSIS — I272 Pulmonary hypertension, unspecified: Secondary | ICD-10-CM | POA: Diagnosis not present

## 2020-02-17 DIAGNOSIS — Z79899 Other long term (current) drug therapy: Secondary | ICD-10-CM | POA: Diagnosis not present

## 2020-02-17 LAB — LIPID PANEL
Chol/HDL Ratio: 4 ratio (ref 0.0–4.4)
Cholesterol, Total: 193 mg/dL (ref 100–199)
HDL: 48 mg/dL (ref >39)
LDL Chol Calc (NIH): 135 mg/dL — ABNORMAL HIGH (ref 0–99)
Triglycerides: 54 mg/dL (ref 0–149)
VLDL Cholesterol Cal: 10 mg/dL (ref 5–40)

## 2020-02-17 LAB — HEPATIC FUNCTION PANEL
ALT: 12 IU/L (ref 0–32)
AST: 25 IU/L (ref 0–40)
Albumin: 3.9 g/dL (ref 3.8–4.8)
Alkaline Phosphatase: 115 IU/L (ref 48–121)
Bilirubin Total: 0.3 mg/dL (ref 0.0–1.2)
Bilirubin, Direct: 0.13 mg/dL (ref 0.00–0.40)
Total Protein: 6.6 g/dL (ref 6.0–8.5)

## 2020-02-17 MED ORDER — HYDROXYZINE HCL 25 MG PO TABS: 25.0000 mg | ORAL_TABLET | Freq: Two times a day (BID) | ORAL | 1 refills | Status: DC

## 2020-02-17 MED ORDER — PAROXETINE HCL 40 MG PO TABS: 40.0000 mg | ORAL_TABLET | Freq: Every day | ORAL | 1 refills | Status: DC

## 2020-02-17 NOTE — Telephone Encounter (Signed)
Received message from [facility] that patient has no active refills remaining for paroxetine or hydroxyzine at her previous pharmacy. These were previously prescribed by a psychiatrist at [facility] Dr Sanjuana Letters who has since moved offices and patient has not re-established care yet.   Patient is currently taking: -paroxetine 40 mg once daily -hydroxyzine 25 mg twice daily  To avoid gap in care, pharmacy is requesting refills from PCP.

## 2020-02-17 NOTE — Addendum Note (Signed)
Addended by: Binnie Rail on: 02/17/2020 02:36 PM   Modules accepted: Orders

## 2020-02-17 NOTE — Telephone Encounter (Signed)
[facility]  

## 2020-02-18 ENCOUNTER — Other Ambulatory Visit: Payer: Self-pay

## 2020-02-18 MED ORDER — SIMVASTATIN 20 MG PO TABS
20.0000 mg | ORAL_TABLET | Freq: Every day | ORAL | 6 refills | Status: DC
Start: 2020-02-18 — End: 2020-04-13

## 2020-02-22 ENCOUNTER — Other Ambulatory Visit: Payer: Self-pay | Admitting: Internal Medicine

## 2020-02-22 DIAGNOSIS — R69 Illness, unspecified: Secondary | ICD-10-CM | POA: Diagnosis not present

## 2020-02-22 DIAGNOSIS — F419 Anxiety disorder, unspecified: Secondary | ICD-10-CM | POA: Diagnosis not present

## 2020-02-25 DIAGNOSIS — E119 Type 2 diabetes mellitus without complications: Secondary | ICD-10-CM | POA: Diagnosis not present

## 2020-02-28 ENCOUNTER — Other Ambulatory Visit: Payer: Self-pay | Admitting: Internal Medicine

## 2020-02-29 ENCOUNTER — Telehealth: Payer: Self-pay | Admitting: Pharmacist

## 2020-02-29 NOTE — Telephone Encounter (Addendum)
Received message from [facility], pt is confused about which cholesterol medication she should take.  Lipid Panel     Component Value Date/Time   CHOL 193 02/17/2020 1116   TRIG 54 02/17/2020 1116   HDL 48 02/17/2020 1116   CHOLHDL 4.0 02/17/2020 1116   CHOLHDL 5 10/22/2019 1411   VLDL 35.8 10/22/2019 1411   LDLCALC 135 (H) 02/17/2020 1116   LABVLDL 10 02/17/2020 1116   Previously it is noted that statins should be avoided due to cirrhosis per PCP. It appears that cardiologist office has prescribed simvastatin on 02/18/20 after fasting lipid panel revealed increased LDL. Of note pt has just re-started ezetimibe since I had it approved for tier exception at the end of June, so this lipid panel is reflective of untreated dyslipidemia.   Per patient she had informed the NP that her history of cirrhosis has prevented her from taking cholesterol drugs in the past, and she was told they would call in something that worked differently "through the gut" (ezetimibe?). Per chart simvastatin was called in. There is data to support statin use in cirrhosis but I would prefer to verify with prescriber before instructing patient to take it.   Plan: -I asked patient to hold off on simvastatin until we can verify with prescriber that statin is OK with cirrhosis -pt has f/u with GI tomorrow for cirrhosis and will ask about statin as well

## 2020-03-01 ENCOUNTER — Other Ambulatory Visit: Payer: Self-pay

## 2020-03-01 ENCOUNTER — Ambulatory Visit: Payer: Medicare HMO | Admitting: Gastroenterology

## 2020-03-01 ENCOUNTER — Encounter: Payer: Self-pay | Admitting: Gastroenterology

## 2020-03-01 ENCOUNTER — Other Ambulatory Visit (INDEPENDENT_AMBULATORY_CARE_PROVIDER_SITE_OTHER): Payer: Medicare HMO

## 2020-03-01 ENCOUNTER — Encounter: Payer: Self-pay | Admitting: Internal Medicine

## 2020-03-01 ENCOUNTER — Ambulatory Visit (INDEPENDENT_AMBULATORY_CARE_PROVIDER_SITE_OTHER): Payer: Medicare HMO | Admitting: Internal Medicine

## 2020-03-01 VITALS — BP 122/64 | HR 60 | Ht 69.0 in | Wt 290.0 lb

## 2020-03-01 DIAGNOSIS — R110 Nausea: Secondary | ICD-10-CM

## 2020-03-01 DIAGNOSIS — R140 Abdominal distension (gaseous): Secondary | ICD-10-CM | POA: Diagnosis not present

## 2020-03-01 DIAGNOSIS — R609 Edema, unspecified: Secondary | ICD-10-CM | POA: Diagnosis not present

## 2020-03-01 DIAGNOSIS — R600 Localized edema: Secondary | ICD-10-CM

## 2020-03-01 DIAGNOSIS — E1042 Type 1 diabetes mellitus with diabetic polyneuropathy: Secondary | ICD-10-CM

## 2020-03-01 DIAGNOSIS — K7460 Unspecified cirrhosis of liver: Secondary | ICD-10-CM

## 2020-03-01 DIAGNOSIS — N3000 Acute cystitis without hematuria: Secondary | ICD-10-CM | POA: Diagnosis not present

## 2020-03-01 DIAGNOSIS — N390 Urinary tract infection, site not specified: Secondary | ICD-10-CM | POA: Insufficient documentation

## 2020-03-01 LAB — POC URINALSYSI DIPSTICK (AUTOMATED)
Bilirubin, UA: NEGATIVE
Blood, UA: NEGATIVE
Glucose, UA: NEGATIVE
Ketones, UA: NEGATIVE
Nitrite, UA: NEGATIVE
Protein, UA: NEGATIVE
Spec Grav, UA: 1.02 (ref 1.010–1.025)
Urobilinogen, UA: 0.2 E.U./dL
pH, UA: 6 (ref 5.0–8.0)

## 2020-03-01 LAB — PROTIME-INR
INR: 1.1 ratio — ABNORMAL HIGH (ref 0.8–1.0)
Prothrombin Time: 12.5 s (ref 9.6–13.1)

## 2020-03-01 MED ORDER — CEPHALEXIN 500 MG PO CAPS: 500.0000 mg | ORAL_CAPSULE | Freq: Two times a day (BID) | ORAL | 0 refills | Status: DC

## 2020-03-01 NOTE — Addendum Note (Signed)
Addended by: Marcina Millard on: 03/01/2020 12:41 PM   Modules accepted: Orders

## 2020-03-01 NOTE — Progress Notes (Signed)
[facility] Progress Note  Chief Complaint: Mary Acosta cirrhosis  Subjective  History: From last clinic visit Oct 2020: "Mayo follows up for the first time in over a year.  She has cryptogenic cirrhosis, most likely related to fatty liver with underlying obesity and diabetes. She continues to struggle with chronic pain and is still on chronic opioids for that.  She has intermittent nausea and previous endoscopy has showed retained food consistent with gastroparesis from opioids and diabetes. She has also been struggling with situational depression this year since the loss of her 43 year old grandchild from an as yet unknown sudden illness several months ago.   Mary Acosta is bothered by swelling in her feet and lower legs.  She does not take spironolactone because she believes it causes her leg pain to be worse.  She also does not take furosemide out of concern it might decrease her potassium level.   It sounds like she still has multiple episodes of high glucose readings requiring additional insulin dosing.     Last EGD Jan 2018-no varices, visualization of stomach limited by retained food."  Had called sept 2020 with concerns on atorvastain with liver disease ________________________ Hulen Skains with bloating and nausea in Dec 2020,(see notes) occurred after increased remeron dose- cannot use reglan due to Hx of wide-complex tachycardia.  zofran recommended as needed. High glucose reading then as well - recommended PCP evaluation.  Mary Acosta was seen for follow-up for cirrhosis.  She is still struggling with high blood sugar recordings and does not understand why it is occurring.  Her peripheral edema is reportedly improved from the last visit with me.  She attributes that to prayers she has been using after discovering an Education officer, museum who is message she likes. She is also still confused about which of her cholesterol-lowering medicine she should be on. Mary Acosta has pill bottles for both Zetia and  simvastatin prescribed by her cardiology clinic about 2 weeks ago.  She is still concerned that the statin may affect her liver function. She is also happy to report that she has been slowly weaning off her opioids under the direction of her pain management physician this year.  ROS: Cardiovascular:  no chest pain Respiratory: no dyspnea Chronic pain syndrome Peripheral edema Fatigue Poor sleep Remainder of systems negative except as above The patient's Past Medical, Family and Social History were reviewed and are on file in the EMR.  Objective:  Med list reviewed  Current Outpatient Medications:  .  acetaminophen (TYLENOL) 500 MG tablet, Take 1,000 mg by mouth 2 (two) times daily., Disp: , Rfl:  .  albuterol (VENTOLIN HFA) 108 (90 Base) MCG/ACT inhaler, TAKE 2 PUFFS BY MOUTH EVERY 6 HOURS AS NEEDED FOR WHEEZE OR SHORTNESS OF BREATH (Patient taking differently: Inhale 2 puffs into the lungs every 6 (six) hours as needed for wheezing or shortness of breath. ), Disp: 18 g, Rfl: 11 .  amitriptyline (ELAVIL) 100 MG tablet, Take 100 mg by mouth at bedtime., Disp: , Rfl:  .  amLODipine (NORVASC) 5 MG tablet, Take 1 tablet (5 mg total) by mouth 2 (two) times daily., Disp: 180 tablet, Rfl: 1 .  aspirin EC 81 MG tablet, Take 1 tablet (81 mg total) by mouth daily., Disp: 90 tablet, Rfl: 3 .  celecoxib (CELEBREX) 100 MG capsule, Take by mouth., Disp: , Rfl:  .  furosemide (LASIX) 40 MG tablet, Take 1 tablet (40 mg total) by mouth daily., Disp: 30 tablet, Rfl: 0 .  gabapentin (NEURONTIN)  300 MG capsule, Take 300 mg by mouth 3 (three) times daily. , Disp: , Rfl:  .  hydrALAZINE (APRESOLINE) 100 MG tablet, Take 1 tablet (100 mg total) by mouth 2 (two) times daily., Disp: 180 tablet, Rfl: 1 .  hydrOXYzine (ATARAX/VISTARIL) 25 MG tablet, Take 1 tablet (25 mg total) by mouth in the morning and at bedtime., Disp: 180 tablet, Rfl: 1 .  insulin glargine (LANTUS) 100 UNIT/ML injection, Inject 1.8 mLs (180  Units total) into the skin daily. (Patient taking differently: Inject 75 Units into the skin 2 (two) times daily. ), Disp: 60 mL, Rfl: 11 .  Insulin Pen Needle (PEN NEEDLES) 31G X 8 MM MISC, 1 Device by Does not apply route daily., Disp: 90 each, Rfl: 3 .  Insulin Syringe-Needle U-100 26G X 1/2" 1 ML MISC, Use daily for insulin injection as directed, Disp: 100 each, Rfl: 3 .  ipratropium-albuterol (DUONEB) 0.5-2.5 (3) MG/3ML SOLN, Take 3 mLs by nebulization every 6 (six) hours as needed (Wheezing or dyspnea.)., Disp: , Rfl:  .  levothyroxine (SYNTHROID) 175 MCG tablet, TAKE 1 TABLET BY MOUTH SIX DAYS A WEEK BEFORE BREAKFAST, Disp: 90 tablet, Rfl: 1 .  metoprolol tartrate (LOPRESSOR) 25 MG tablet, Take 1 tablet (25 mg total) by mouth 2 (two) times daily., Disp: 180 tablet, Rfl: 3 .  morphine (MS CONTIN) 30 MG 12 hr tablet, Take 30 mg by mouth 2 (two) times daily., Disp: , Rfl:  .  ondansetron (ZOFRAN-ODT) 4 MG disintegrating tablet, Take 1 tablet (4 mg total) by mouth every 6 (six) hours as needed for nausea or vomiting., Disp: 30 tablet, Rfl: 3 .  PARoxetine (PAXIL) 40 MG tablet, Take 1 tablet (40 mg total) by mouth daily., Disp: 90 tablet, Rfl: 1 .  potassium chloride SA (KLOR-CON M20) 20 MEQ tablet, TAKE 2 TABLETS EVERY DAY AS NEEDED FOR CRAMPING, Disp: 180 tablet, Rfl: 1 .  simvastatin (ZOCOR) 20 MG tablet, Take 1 tablet (20 mg total) by mouth at bedtime., Disp: 30 tablet, Rfl: 6 .  tiZANidine (ZANAFLEX) 2 MG tablet, Take 2 mg by mouth as needed for muscle spasms. , Disp: , Rfl: 2 .  cephALEXin (KEFLEX) 500 MG capsule, Take 1 capsule (500 mg total) by mouth 2 (two) times daily., Disp: 14 capsule, Rfl: 0 .  ezetimibe (ZETIA) 10 MG tablet, Take 1 tablet (10 mg total) by mouth daily., Disp: 30 tablet, Rfl: 1 .  mirtazapine (REMERON) 45 MG tablet, mirtazapine 45 mg tablet  TAKE 1 TABLET BY MOUTH EVERYDAY AT BEDTIME, Disp: , Rfl:  .  spironolactone (ALDACTONE) 25 MG tablet, Take 1 tablet (25 mg total)  by mouth daily. CAUSES PAIN ONLY TAKES WHEN BP IS ELEVATED, Disp: 90 tablet, Rfl: 0   Vital signs in last 24 hrs: Vitals:   03/01/20 0916  BP: 122/64  Pulse: 60    Physical Exam  Pleasant, conversational, antalgic gait, gets on exam table slowly but without assistance  HEENT: sclera anicteric, oral mucosa moist without lesions  Neck: supple, no thyromegaly, JVD or lymphadenopathy  Cardiac: RRR without murmurs, S1S2 heard, 2+ pretibial edema bilaterally with venous stasis changes and excoriations (she reports that it is frequently itchy)  Pulm: clear to auscultation bilaterally, normal RR and effort noted  Abdomen: soft, morbidly obese, limiting exam.  No tenderness, with active bowel sounds.  Skin; warm and dry, no jaundice or rash  Labs:  CBC Latest Ref Rng & Units 10/22/2019 07/20/2019 07/06/2019  WBC 4.0 - 10.5 K/uL 7.8  7.8 6.8  Hemoglobin 12.0 - 15.0 g/dL 13.7 12.8 12.9  Hematocrit 36 - 46 % 43.1 40.5 43.7  Platelets 150 - 400 K/uL 144.0(L) 176.0 129(L)   CMP Latest Ref Rng & Units 02/17/2020 10/22/2019 07/20/2019  Glucose 70 - 99 mg/dL - 246(H) 397(H)  BUN 6 - 23 mg/dL - 18 10  Creatinine 0.40 - 1.20 mg/dL - 0.98 0.87  Sodium 135 - 145 mEq/L - 131(L) 132(L)  Potassium 3.5 - 5.1 mEq/L - 4.8 4.2  Chloride 96 - 112 mEq/L - 94(L) 93(L)  CO2 19 - 32 mEq/L - 33(H) 32  Calcium 8.4 - 10.5 mg/dL - 10.2 9.1  Total Protein 6.0 - 8.5 g/dL 6.6 8.0 7.5  Total Bilirubin 0.0 - 1.2 mg/dL 0.3 0.7 0.5  Alkaline Phos 48 - 121 IU/L 115 114 115  AST 0 - 40 IU/L 25 26 37  ALT 0 - 32 IU/L 12 18 22     ___________________________________________ Radiologic studies:   ____________________________________________ Other:   _____________________________________________ Assessment & Plan  Assessment: Encounter Diagnoses  Name Primary?  . Cirrhosis of liver without ascites, unspecified hepatic cirrhosis type ([place]) Yes  . Peripheral edema   . Abdominal bloating   . Nausea without  vomiting    End-stage Nash related cirrhosis, peripheral edema, stable overall since I last saw her. She still has questions on her dyslipidemic therapy, so I will forward my note to her cardiologist.  If it is felt that this combination therapy including a statin is of high benefit to lower her cardiovascular risk, but I think the low risk of of the statin causing worsening liver function warrants continued use. She is due for cirrhosis related surveillance. I will also copy note to primary care, I suspect they are aware of her difficulties with chronic uncontrolled diabetes, multifactorial. Plan: Alpha-fetoprotein, PT/INR Ultrasound to screen for hepatoma. See me in 6 months or sooner as needed.   30 minutes were spent on this encounter (including chart review, history/exam, counseling/coordination of care, and documentation)  [name]

## 2020-03-01 NOTE — Assessment & Plan Note (Signed)
Acute Urine dip here shows some leukocytes and given her symptoms she likely does have a UTI Start Keflex twice daily x1 week Increase water intake This could be causing elevated sugars, some dehydration possibly lightheadedness Stressed increase fluids and monitoring her above symptoms-if they do not improve may need to fall

## 2020-03-01 NOTE — Patient Instructions (Addendum)
Medications reviewed and updated.  Changes include :   Keflex twice daily for your UTI  Your prescription(s) have been submitted to your pharmacy. Please take as directed and contact our office if you believe you are having problem(s) with the medication(s).     Urinary Tract Infection, Adult  A urinary tract infection (UTI) is an infection of any part of the urinary tract. The urinary tract includes the kidneys, ureters, bladder, and urethra. These organs make, store, and get rid of urine in the body. Your health care provider may use other names to describe the infection. An upper UTI affects the ureters and kidneys (pyelonephritis). A lower UTI affects the bladder (cystitis) and urethra (urethritis). What are the causes? Most urinary tract infections are caused by bacteria in your genital area, around the entrance to your urinary tract (urethra). These bacteria grow and cause inflammation of your urinary tract. What increases the risk? You are more likely to develop this condition if:  You have a urinary catheter that stays in place (indwelling).  You are not able to control when you urinate or have a bowel movement (you have incontinence).  You are female and you: ? Use a spermicide or diaphragm for birth control. ? Have low estrogen levels. ? Are pregnant.  You have certain genes that increase your risk (genetics).  You are sexually active.  You take antibiotic medicines.  You have a condition that causes your flow of urine to slow down, such as: ? An enlarged prostate, if you are female. ? Blockage in your urethra (stricture). ? A kidney stone. ? A nerve condition that affects your bladder control (neurogenic bladder). ? Not getting enough to drink, or not urinating often.  You have certain medical conditions, such as: ? Diabetes. ? A weak disease-fighting system (immunesystem). ? Sickle cell disease. ? Gout. ? Spinal cord injury. What are the signs or  symptoms? Symptoms of this condition include:  Needing to urinate right away (urgently).  Frequent urination or passing small amounts of urine frequently.  Pain or burning with urination.  Blood in the urine.  Urine that smells bad or unusual.  Trouble urinating.  Cloudy urine.  Vaginal discharge, if you are female.  Pain in the abdomen or the lower back. You may also have:  Vomiting or a decreased appetite.  Confusion.  Irritability or tiredness.  A fever.  Diarrhea. The first symptom in older adults may be confusion. In some cases, they may not have any symptoms until the infection has worsened. How is this diagnosed? This condition is diagnosed based on your medical history and a physical exam. You may also have other tests, including:  Urine tests.  Blood tests.  Tests for sexually transmitted infections (STIs). If you have had more than one UTI, a cystoscopy or imaging studies may be done to determine the cause of the infections. How is this treated? Treatment for this condition includes:  Antibiotic medicine.  Over-the-counter medicines to treat discomfort.  Drinking enough water to stay hydrated. If you have frequent infections or have other conditions such as a kidney stone, you may need to see a health care provider who specializes in the urinary tract (urologist). In rare cases, urinary tract infections can cause sepsis. Sepsis is a life-threatening condition that occurs when the body responds to an infection. Sepsis is treated in the hospital with IV antibiotics, fluids, and other medicines. Follow these instructions at home:  Medicines  Take over-the-counter and prescription medicines only as told  by your health care provider.  If you were prescribed an antibiotic medicine, take it as told by your health care provider. Do not stop using the antibiotic even if you start to feel better. General instructions  Make sure you: ? Empty your bladder  often and completely. Do not hold urine for long periods of time. ? Empty your bladder after sex. ? Wipe from front to back after a bowel movement if you are female. Use each tissue one time when you wipe.  Drink enough fluid to keep your urine pale yellow.  Keep all follow-up visits as told by your health care provider. This is important. Contact a health care provider if:  Your symptoms do not get better after 1-2 days.  Your symptoms go away and then return. Get help right away if you have:  Severe pain in your back or your lower abdomen.  A fever.  Nausea or vomiting. Summary  A urinary tract infection (UTI) is an infection of any part of the urinary tract, which includes the kidneys, ureters, bladder, and urethra.  Most urinary tract infections are caused by bacteria in your genital area, around the entrance to your urinary tract (urethra).  Treatment for this condition often includes antibiotic medicines.  If you were prescribed an antibiotic medicine, take it as told by your health care provider. Do not stop using the antibiotic even if you start to feel better.  Keep all follow-up visits as told by your health care provider. This is important. This information is not intended to replace advice given to you by your health care provider. Make sure you discuss any questions you have with your health care provider. Document Revised: 07/30/2018 Document Reviewed: 02/19/2018 Elsevier Patient Education  2020 [employer].

## 2020-03-01 NOTE — Addendum Note (Signed)
Addended by: Cresenciano Lick on: 03/01/2020 01:42 PM   Modules accepted: Orders

## 2020-03-01 NOTE — Assessment & Plan Note (Signed)
Chronic She is taking her insulin regularly She did check her sugars last night and what they were elevated Elevation could be related to UTI, but there may be other reasons as well She will continue to monitor her sugars closely over the next few days and let me know if her sugars do not improve

## 2020-03-01 NOTE — Telephone Encounter (Signed)
Called patient back, let her know to continue ezetimibe and hold off on simvastatin, and follow up with cardiology office in 6 weeks for LFTs and lipid panel. Pt voiced understanding.

## 2020-03-01 NOTE — Patient Instructions (Signed)
If you are age 64 or older, your body mass index should be between 23-30. Your Body mass index is 42.83 kg/m. If this is out of the aforementioned range listed, please consider follow up with your Primary Care Provider.  If you are age 83 or younger, your body mass index should be between 19-25. Your Body mass index is 42.83 kg/m. If this is out of the aformentioned range listed, please consider follow up with your Primary Care Provider.   Your provider has requested that you go to the basement level for lab work before leaving today. Press "B" on the elevator. The lab is located at the first door on the left as you exit the elevator.  Due to recent changes in healthcare laws, you may see the results of your imaging and laboratory studies on MyChart before your provider has had a chance to review them.  We understand that in some cases there may be results that are confusing or concerning to you. Not all laboratory results come back in the same time frame and the provider may be waiting for multiple results in order to interpret others.  Please give [place] 48 hours in order for your provider to thoroughly review all the results before contacting the office for clarification of your results.   You have been scheduled for an abdominal ultrasound at [facility] (1st floor of hospital) on 03-06-2020 at 830am. Please arrive 15 minutes prior to your appointment for registration. Make certain not to have anything to eat or drink 6 hours prior to your appointment. Should you need to reschedule your appointment, please contact radiology at [phone]. This test typically takes about 30 minutes to perform.  It was a pleasure to see you today!  Dr. Loletha Carrow

## 2020-03-01 NOTE — Progress Notes (Signed)
Subjective:    Patient ID: Mary Acosta, female    DOB: 1956/01/28, 64 y.o.   MRN: [id number]  HPI The patient is here for an acute visit.  Today she has felt more stumbling and "drunk".  She is not taking anything out of the ordinary.  She does feel lightheaded at times.  She has had increased urination and dysuria for the past 2-3 weeks.  The bottom of her abdomen feels like someone has kicked her.  She has not had any fevers or chills.  She has had some nausea.  She typically does not look at her urine on a regular basis, but states it may be darker than usual.   Her sugars last night was 350s.  She is taking her insulin.  She is unsure why her sugars are still elevated.  She is trying to eat well.   She is not able to use her cpap.  She has seen pulmonary today - she needs to lose weight to be eligible for a different procedure for the OSA>    Medications and allergies reviewed with patient and updated if appropriate.  Patient Active Problem List   Diagnosis Date Noted  . Grief 02/03/2019  . Nausea 01/04/2019  . Reactive airway disease 10/21/2018  . Arthralgia 09/04/2018  . Narcotic overdose ([place]) 05/14/2018  . Thyroid cancer ([place]) 04/30/2018  . Obesity hypoventilation syndrome ([place]) 09/24/2017  . Palpitations 09/18/2017  . Diabetic neuropathy ([place]) 06/04/2017  . Vitamin B12 deficiency 03/05/2017  . Numbness and tingling in both hands 02/24/2017  . OSA (obstructive sleep apnea) 01/31/2017  . Liver cirrhosis secondary to NASH ([place]) 01/31/2017  . Hypothyroidism 01/31/2017  . Chronic narcotic use 01/31/2017  . Fibromyalgia 01/31/2017  . Hyperlipidemia 01/31/2017  . Gastroparesis   . Memory disorder 07/25/2016  . Acute bronchitis 04/30/2016  . Lump in neck 01/29/2016  . Chronic diastolic (congestive) heart failure ([place]) 01/03/2016  . Fatty liver 01/03/2016  . Type 1 diabetes mellitus ([place]) 01/03/2016  . Recurrent Clostridium difficile diarrhea 01/03/2016  .  Chronic respiratory failure ([place]) 11/10/2014  . Physical deconditioning 09/26/2014  . Dyspnea 09/26/2014  . Iron deficiency anemia, unspecified  04/05/2011  . Bariatric surgery status 04/05/2011  . Left arm pain   . UNSPECIFIED VITAMIN D DEFICIENCY 10/22/2007  . LOW BACK PAIN, CHRONIC 10/22/2007  . INSOMNIA 10/22/2007  . Obesity 10/14/2007  . Chronic pain syndrome 10/14/2007  . CARPAL TUNNEL SYNDROME 10/14/2007  . ALLERGIC RHINITIS CAUSE UNSPECIFIED 10/14/2007  . ARTHRITIS 10/14/2007  . Anxiety state 08/25/2007  . Depression 08/25/2007  . Essential hypertension 08/25/2007  . ASTHMA 08/25/2007  . CONSTIPATION 08/25/2007  . POLYMYALGIA RHEUMATICA 08/25/2007  . LEG EDEMA, BILATERAL 08/25/2007    Current Outpatient Medications on File Prior to Visit  Medication Sig Dispense Refill  . acetaminophen (TYLENOL) 500 MG tablet Take 1,000 mg by mouth 2 (two) times daily.    Marland Kitchen albuterol (VENTOLIN HFA) 108 (90 Base) MCG/ACT inhaler TAKE 2 PUFFS BY MOUTH EVERY 6 HOURS AS NEEDED FOR WHEEZE OR SHORTNESS OF BREATH (Patient taking differently: Inhale 2 puffs into the lungs every 6 (six) hours as needed for wheezing or shortness of breath. ) 18 g 11  . amitriptyline (ELAVIL) 100 MG tablet Take 100 mg by mouth at bedtime.    Marland Kitchen amLODipine (NORVASC) 5 MG tablet Take 1 tablet (5 mg total) by mouth 2 (two) times daily. 180 tablet 1  . aspirin EC 81 MG tablet Take 1 tablet (  81 mg total) by mouth daily. 90 tablet 3  . celecoxib (CELEBREX) 100 MG capsule Take by mouth.    . ezetimibe (ZETIA) 10 MG tablet Take 1 tablet (10 mg total) by mouth daily. 30 tablet 1  . furosemide (LASIX) 40 MG tablet Take 1 tablet (40 mg total) by mouth daily. 30 tablet 0  . gabapentin (NEURONTIN) 300 MG capsule Take 300 mg by mouth 3 (three) times daily.     . hydrALAZINE (APRESOLINE) 100 MG tablet Take 1 tablet (100 mg total) by mouth 2 (two) times daily. 180 tablet 1  . hydrOXYzine (ATARAX/VISTARIL) 25 MG tablet Take 1 tablet  (25 mg total) by mouth in the morning and at bedtime. 180 tablet 1  . insulin glargine (LANTUS) 100 UNIT/ML injection Inject 1.8 mLs (180 Units total) into the skin daily. (Patient taking differently: Inject 75 Units into the skin 2 (two) times daily. ) 60 mL 11  . Insulin Pen Needle (PEN NEEDLES) 31G X 8 MM MISC 1 Device by Does not apply route daily. 90 each 3  . Insulin Syringe-Needle U-100 26G X 1/2" 1 ML MISC Use daily for insulin injection as directed 100 each 3  . ipratropium-albuterol (DUONEB) 0.5-2.5 (3) MG/3ML SOLN Take 3 mLs by nebulization every 6 (six) hours as needed (Wheezing or dyspnea.).    Marland Kitchen levothyroxine (SYNTHROID) 175 MCG tablet TAKE 1 TABLET BY MOUTH SIX DAYS A WEEK BEFORE BREAKFAST 90 tablet 1  . metoprolol tartrate (LOPRESSOR) 25 MG tablet Take 1 tablet (25 mg total) by mouth 2 (two) times daily. 180 tablet 3  . mirtazapine (REMERON) 45 MG tablet mirtazapine 45 mg tablet  TAKE 1 TABLET BY MOUTH EVERYDAY AT BEDTIME    . morphine (MS CONTIN) 30 MG 12 hr tablet Take 30 mg by mouth 2 (two) times daily.    . ondansetron (ZOFRAN-ODT) 4 MG disintegrating tablet Take 1 tablet (4 mg total) by mouth every 6 (six) hours as needed for nausea or vomiting. 30 tablet 3  . PARoxetine (PAXIL) 40 MG tablet Take 1 tablet (40 mg total) by mouth daily. 90 tablet 1  . potassium chloride SA (KLOR-CON M20) 20 MEQ tablet TAKE 2 TABLETS EVERY DAY AS NEEDED FOR CRAMPING 180 tablet 1  . simvastatin (ZOCOR) 20 MG tablet Take 1 tablet (20 mg total) by mouth at bedtime. 30 tablet 6  . spironolactone (ALDACTONE) 25 MG tablet Take 1 tablet (25 mg total) by mouth daily. CAUSES PAIN ONLY TAKES WHEN BP IS ELEVATED 90 tablet 0  . tiZANidine (ZANAFLEX) 2 MG tablet Take 2 mg by mouth as needed for muscle spasms.   2   No current facility-administered medications on file prior to visit.    Past Medical History:  Diagnosis Date  . AKI (acute kidney injury) ([place]) 01/2017  . Anxiety    with panic attacks  .  Arthritis    "back; feet; hands; shoulders" (08/26/2014)  . Asthma   . Cervical cancer ([place])   . Chronic lower back pain   . Chronic narcotic use   . Chronic pain syndrome    [facility]  . Cirrhosis ([place])   . Clostridium difficile infection 2017  . COPD (chronic obstructive pulmonary disease) ([place])   . Daily headache   . Depression   . Diabetic neuropathy ([place]) 06/04/2017  . DJD (degenerative joint disease)   . Fatty liver disease, nonalcoholic   . Fibromyalgia   . Frequency of urination   . HCAP (healthcare-associated  pneumonia) 08/26/2014  . History of TIA (transient ischemic attack) 11-01-2010   NO RESIDUAL  . Hyperlipidemia   . Hypertension   . Hypothyroidism   . IDDM (insulin dependent diabetes mellitus)   . Insomnia   . Lumbar stenosis   . Memory difficulty 07/25/2016  . Nocturia   . OSA (obstructive sleep apnea)    NO CPAP SINCE WT LOSS  . Osteoarthritis    with severe disease in knee  . Pneumonia "several times"  . Polymyalgia rheumatica ([place])   . Scoliosis   . Seasonal allergies   . Thyroid cancer ([place])   . Urgency of urination   . Vaginal pain S/P SLING  FEB 2012    Past Surgical History:  Procedure Laterality Date  . APPENDECTOMY  1982  . BREAST EXCISIONAL BIOPSY Left 02/28/2005   Atypical Ductal Hyperplasia  . CARDIAC CATHETERIZATION  09-04-2004   NORMAL CORONARY ANATOMY/ NORMAL LVF/ EF 60%  . CARDIOVASCULAR STRESS TEST  12-27-2010  DR [place]   ABNORMAL NUCLEAR STUDY W/ /MILD INFERIOR ISCHEMIA/ EF 69%/  CT HEART ANGIOGRAM ;  NO ACUTE FINDINGS  . CRYOABLATION  05/16/2003   w/LEEP FOR ABNORMAL PAP SMEAR  . CYSTOSCOPY  05/18/2012   Procedure: CYSTOSCOPY;  Surgeon: Reece Packer, MD;  Location: [facility];  Service: Urology;  Laterality: N/A;  examination under anethesia  . ESOPHAGOGASTRODUODENOSCOPY (EGD) WITH PROPOFOL N/A 09/03/2016   Procedure: ESOPHAGOGASTRODUODENOSCOPY (EGD) WITH PROPOFOL;  Surgeon: Doran Stabler,  MD;  Location: [facility];  Service: Gastroenterology;  Laterality: N/A;  . HYSTEROSCOPY WITH D & C  08-19-2007   PMB  . KNEE ARTHROSCOPY W/ DEBRIDEMENT Left 03/29/2006   INTERNAL DERANGEMENT/ SEVERE DJD/ MENISCUS TEARS  . LAPAROSCOPIC CHOLECYSTECTOMY  06-10-2002  . LAPAROSCOPIC GASTRIC BANDING  03/01/2006   TRUNCAL VAGOTOMY/ PLACEMENT OF VG BAND  . REVISION TOTAL KNEE ARTHROPLASTY Left 08-29-2008; 05/2009  . TONSILLECTOMY  1969  . TOTAL KNEE ARTHROPLASTY Left 01-23-2007   SEVERE DJD  . TOTAL THYROIDECTOMY  11-22-2005   BILATERAL THYROID NODULES-- PAPILLARY CARCINOMA (0.5CM)/ ADENOMATOID NODULES  . TRANSTHORACIC ECHOCARDIOGRAM  12-27-2010   LVSF NORMAL / EF 78-29%/ GRADE I DIASTOLIC DYSFUNCTION/ MILD MITRAL REGURG. / MILDLY DILATED LEFT ATRIUM/ MILDY INCREASED SYSTOLIC PRESSURE OF PULMONARY ARTERIES  . TRANSVAGINAL SUBURETERAL TAPE/ SLING  09-28-2010   MIXED URINARY INCONTINENCE  . TUBAL LIGATION  1983    Social History   Socioeconomic History  . Marital status: Married    Spouse name: Not on file  . Number of children: 2  . Years of education: 67  . Highest education level: Not on file  Occupational History  . Occupation: disabled    Fish farm manager: UNEMPLOYED  Tobacco Use  . Smoking status: Former Smoker    Packs/day: 2.50    Years: 39.00    Pack years: 97.50    Types: Cigarettes    Quit date: 10/22/2010    Years since quitting: 9.3  . Smokeless tobacco: Never Used  Vaping Use  . Vaping Use: Never used  Substance and Sexual Activity  . Alcohol use: No    Alcohol/week: 0.0 standard drinks  . Drug use: No  . Sexual activity: Not Currently  Other Topics Concern  . Not on file  Social History Narrative   Lives at home w/ her husband and grandson   Right-handed   Caffeine: 1 cup of coffee per week + [employer]   Social Determinants of Health   Financial Resource Strain: High Risk  .  Difficulty of Paying Living Expenses: Hard  Food Insecurity:   . Worried About Ship broker in the Last Year:   . Arboriculturist in the Last Year:   Transportation Needs:   . Film/video editor (Medical):   Marland Kitchen Lack of Transportation (Non-Medical):   Physical Activity:   . Days of Exercise per Week:   . Minutes of Exercise per Session:   Stress:   . Feeling of Stress :   Social Connections:   . Frequency of Communication with Friends and Family:   . Frequency of Social Gatherings with Friends and Family:   . Attends Religious Services:   . Active Member of Clubs or Organizations:   . Attends Archivist Meetings:   Marland Kitchen Marital Status:     Family History  Problem Relation Age of Onset  . Diabetes Mother   . Heart disease Mother   . Dementia Mother   . Heart disease Father   . High blood pressure Father   . Colon cancer Maternal Uncle        x 2  . Breast cancer Other        great aunts x 5    Review of Systems  Constitutional: Negative for fever.  Gastrointestinal: Positive for nausea.  Genitourinary: Positive for dysuria, frequency and pelvic pain. Negative for hematuria.       Urine is darker than usual  Neurological: Positive for dizziness and light-headedness (intermittent). Negative for headaches.       Objective:   Vitals:   03/01/20 1124  BP: 124/70  Pulse: 76  Temp: 98.3 F (36.8 C)  SpO2: 95%   BP Readings from Last 3 Encounters:  03/01/20 124/70  03/01/20 122/64  01/28/20 (!) 140/100   Wt Readings from Last 3 Encounters:  03/01/20 290 lb (131.5 kg)  01/28/20 290 lb (131.5 kg)  01/12/20 (!) 309 lb 9.6 oz (140.4 kg)   Body mass index is 42.83 kg/m.   Physical Exam Constitutional:      General: She is not in acute distress.    Appearance: Normal appearance. She is not ill-appearing.  HENT:     Head: Normocephalic and atraumatic.  Abdominal:     Palpations: Abdomen is soft.     Tenderness: There is abdominal tenderness (Mild and suprapubic region, but exam is limited due to obesity). There is no guarding or  rebound.  Musculoskeletal:     Right lower leg: No edema.     Left lower leg: No edema.  Skin:    General: Skin is warm and dry.  Neurological:     Mental Status: She is alert.  Psychiatric:        Mood and Affect: Mood normal.        Behavior: Behavior normal.        Thought Content: Thought content normal.            Assessment & Plan:    See Problem List for Assessment and Plan of chronic medical problems.    This visit occurred during the SARS-CoV-2 public health emergency.  Safety protocols were in place, including screening questions prior to the visit, additional usage of staff PPE, and extensive cleaning of exam room while observing appropriate contact time as indicated for disinfecting solutions.

## 2020-03-01 NOTE — Addendum Note (Signed)
Addended by: Marcina Millard on: 03/01/2020 01:09 PM   Modules accepted: Orders

## 2020-03-02 LAB — URINE CULTURE

## 2020-03-02 LAB — AFP TUMOR MARKER: AFP-Tumor Marker: 2.1 ng/mL

## 2020-03-06 ENCOUNTER — Ambulatory Visit (HOSPITAL_COMMUNITY)
Admission: RE | Admit: 2020-03-06 | Discharge: 2020-03-06 | Disposition: A | Discharge status: Home / Self Care | Payer: Medicare HMO | Source: Ambulatory Visit | Attending: Gastroenterology | Admitting: Gastroenterology

## 2020-03-06 ENCOUNTER — Other Ambulatory Visit: Payer: Self-pay

## 2020-03-06 DIAGNOSIS — R609 Edema, unspecified: Secondary | ICD-10-CM | POA: Diagnosis not present

## 2020-03-06 DIAGNOSIS — R110 Nausea: Secondary | ICD-10-CM | POA: Insufficient documentation

## 2020-03-06 DIAGNOSIS — K7460 Unspecified cirrhosis of liver: Secondary | ICD-10-CM | POA: Insufficient documentation

## 2020-03-06 DIAGNOSIS — K7689 Other specified diseases of liver: Secondary | ICD-10-CM | POA: Diagnosis not present

## 2020-03-06 DIAGNOSIS — R140 Abdominal distension (gaseous): Secondary | ICD-10-CM | POA: Insufficient documentation

## 2020-03-11 DIAGNOSIS — J449 Chronic obstructive pulmonary disease, unspecified: Secondary | ICD-10-CM | POA: Diagnosis not present

## 2020-03-16 DIAGNOSIS — H353232 Exudative age-related macular degeneration, bilateral, with inactive choroidal neovascularization: Secondary | ICD-10-CM | POA: Diagnosis not present

## 2020-03-18 ENCOUNTER — Other Ambulatory Visit: Payer: Self-pay

## 2020-03-18 ENCOUNTER — Telehealth (INDEPENDENT_AMBULATORY_CARE_PROVIDER_SITE_OTHER): Payer: Medicare HMO | Admitting: Family Medicine

## 2020-03-18 ENCOUNTER — Encounter: Payer: Self-pay | Admitting: Family Medicine

## 2020-03-18 VITALS — HR 74 | Temp 96.9°F | Ht 69.0 in | Wt 290.0 lb

## 2020-03-18 DIAGNOSIS — E1065 Type 1 diabetes mellitus with hyperglycemia: Secondary | ICD-10-CM

## 2020-03-18 DIAGNOSIS — R42 Dizziness and giddiness: Secondary | ICD-10-CM

## 2020-03-18 DIAGNOSIS — R509 Fever, unspecified: Secondary | ICD-10-CM

## 2020-03-18 DIAGNOSIS — E119 Type 2 diabetes mellitus without complications: Secondary | ICD-10-CM | POA: Diagnosis not present

## 2020-03-18 NOTE — Progress Notes (Signed)
Virtual Visit via Video Note  I connected with Mary Acosta on 03/18/20 at  9:30 AM EDT by a video enabled telemedicine application and verified that I am speaking with the correct person using two identifiers. Location patient: home Location provider: home office Persons participating in the virtual visit: patient, provider  I discussed the limitations of evaluation and management by telemedicine and the availability of in person appointments. The patient expressed understanding and agreed to proceed.  Chief Complaint  Patient presents with  . Sore Throat    x 3 days with fever   . Blood Sugar Problem    readings have been high for last few days near 500 last night today fasting on the phone 244  . Fatigue    x 1 day   . Cough    used oxygen last night due to not feeling well      HPI: Mary Acosta is a 64 y.o. female who complains of sore throat, fever x 3 days. Pt also endorses increased fatigue.  Pt had type 1 DM and notes her BS have been significantly elevated the past few days, in the high 400s-500 and fasting this AM = 244. She states her normal BS run in mid 200's. She has not been eating much in the past few days, just sipping on Tang. No appetite. No loss of taste or smell.  She states she fells "like a drunk" and feels like she has to hold onto the walls to get around the house. Denies vertigo but she thinks lightheaded or off-balance.  She feels like her "heart is beating so hard". No CP. No increased SOB.  She used supplemental oxygen last night to see if it helped her feel better. She is supposed to use oxygen nightly but does not regularly do so.   Home covid test last night was negative. She had Moderna covid vaccine in Feb 2021.  Past Medical History:  Diagnosis Date  . AKI (acute kidney injury) ([place]) 01/2017  . Anxiety    with panic attacks  . Arthritis    "back; feet; hands; shoulders" (08/26/2014)  . Asthma   . Cervical cancer ([place])   .  Chronic lower back pain   . Chronic narcotic use   . Chronic pain syndrome    [facility]  . Cirrhosis ([place])   . Clostridium difficile infection 2017  . COPD (chronic obstructive pulmonary disease) ([place])   . Daily headache   . Depression   . Diabetic neuropathy ([place]) 06/04/2017  . DJD (degenerative joint disease)   . Fatty liver disease, nonalcoholic   . Fibromyalgia   . Frequency of urination   . HCAP (healthcare-associated pneumonia) 08/26/2014  . History of TIA (transient ischemic attack) 11-01-2010   NO RESIDUAL  . Hyperlipidemia   . Hypertension   . Hypothyroidism   . IDDM (insulin dependent diabetes mellitus)   . Insomnia   . Lumbar stenosis   . Memory difficulty 07/25/2016  . Nocturia   . OSA (obstructive sleep apnea)    NO CPAP SINCE WT LOSS  . Osteoarthritis    with severe disease in knee  . Pneumonia "several times"  . Polymyalgia rheumatica ([place])   . Scoliosis   . Seasonal allergies   . Thyroid cancer ([place])   . Urgency of urination   . Vaginal pain S/P SLING  FEB 2012    Past Surgical History:  Procedure Laterality Date  . APPENDECTOMY  1982  .  BREAST EXCISIONAL BIOPSY Left 02/28/2005   Atypical Ductal Hyperplasia  . CARDIAC CATHETERIZATION  09-04-2004   NORMAL CORONARY ANATOMY/ NORMAL LVF/ EF 60%  . CARDIOVASCULAR STRESS TEST  12-27-2010  DR [place]   ABNORMAL NUCLEAR STUDY W/ /MILD INFERIOR ISCHEMIA/ EF 69%/  CT HEART ANGIOGRAM ;  NO ACUTE FINDINGS  . CRYOABLATION  05/16/2003   w/LEEP FOR ABNORMAL PAP SMEAR  . CYSTOSCOPY  05/18/2012   Procedure: CYSTOSCOPY;  Surgeon: Reece Packer, MD;  Location: [facility];  Service: Urology;  Laterality: N/A;  examination under anethesia  . ESOPHAGOGASTRODUODENOSCOPY (EGD) WITH PROPOFOL N/A 09/03/2016   Procedure: ESOPHAGOGASTRODUODENOSCOPY (EGD) WITH PROPOFOL;  Surgeon: Doran Stabler, MD;  Location: [facility];  Service: Gastroenterology;  Laterality: N/A;  . HYSTEROSCOPY WITH D &  C  08-19-2007   PMB  . KNEE ARTHROSCOPY W/ DEBRIDEMENT Left 03/29/2006   INTERNAL DERANGEMENT/ SEVERE DJD/ MENISCUS TEARS  . LAPAROSCOPIC CHOLECYSTECTOMY  06-10-2002  . LAPAROSCOPIC GASTRIC BANDING  03/01/2006   TRUNCAL VAGOTOMY/ PLACEMENT OF VG BAND  . REVISION TOTAL KNEE ARTHROPLASTY Left 08-29-2008; 05/2009  . TONSILLECTOMY  1969  . TOTAL KNEE ARTHROPLASTY Left 01-23-2007   SEVERE DJD  . TOTAL THYROIDECTOMY  11-22-2005   BILATERAL THYROID NODULES-- PAPILLARY CARCINOMA (0.5CM)/ ADENOMATOID NODULES  . TRANSTHORACIC ECHOCARDIOGRAM  12-27-2010   LVSF NORMAL / EF 16-10%/ GRADE I DIASTOLIC DYSFUNCTION/ MILD MITRAL REGURG. / MILDLY DILATED LEFT ATRIUM/ MILDY INCREASED SYSTOLIC PRESSURE OF PULMONARY ARTERIES  . TRANSVAGINAL SUBURETERAL TAPE/ SLING  09-28-2010   MIXED URINARY INCONTINENCE  . TUBAL LIGATION  1983    Family History  Problem Relation Age of Onset  . Diabetes Mother   . Heart disease Mother   . Dementia Mother   . Heart disease Father   . High blood pressure Father   . Colon cancer Maternal Uncle        x 2  . Breast cancer Other        great aunts x 5    Social History   Tobacco Use  . Smoking status: Former Smoker    Packs/day: 2.50    Years: 39.00    Pack years: 97.50    Types: Cigarettes    Quit date: 10/22/2010    Years since quitting: 9.4  . Smokeless tobacco: Never Used  Vaping Use  . Vaping Use: Never used  Substance Use Topics  . Alcohol use: No    Alcohol/week: 0.0 standard drinks  . Drug use: No     Current Outpatient Medications:  .  acetaminophen (TYLENOL) 500 MG tablet, Take 1,000 mg by mouth 2 (two) times daily., Disp: , Rfl:  .  albuterol (VENTOLIN HFA) 108 (90 Base) MCG/ACT inhaler, TAKE 2 PUFFS BY MOUTH EVERY 6 HOURS AS NEEDED FOR WHEEZE OR SHORTNESS OF BREATH (Patient taking differently: Inhale 2 puffs into the lungs every 6 (six) hours as needed for wheezing or shortness of breath. ), Disp: 18 g, Rfl: 11 .  amitriptyline (ELAVIL) 100 MG  tablet, Take 100 mg by mouth at bedtime., Disp: , Rfl:  .  amLODipine (NORVASC) 5 MG tablet, Take 1 tablet (5 mg total) by mouth 2 (two) times daily., Disp: 180 tablet, Rfl: 1 .  aspirin EC 81 MG tablet, Take 1 tablet (81 mg total) by mouth daily., Disp: 90 tablet, Rfl: 3 .  celecoxib (CELEBREX) 100 MG capsule, Take by mouth., Disp: , Rfl:  .  ezetimibe (ZETIA) 10 MG tablet, Take 1 tablet (10 mg total)  by mouth daily., Disp: 30 tablet, Rfl: 1 .  furosemide (LASIX) 40 MG tablet, Take 1 tablet (40 mg total) by mouth daily., Disp: 30 tablet, Rfl: 0 .  gabapentin (NEURONTIN) 300 MG capsule, Take 300 mg by mouth 3 (three) times daily. , Disp: , Rfl:  .  hydrALAZINE (APRESOLINE) 100 MG tablet, Take 1 tablet (100 mg total) by mouth 2 (two) times daily., Disp: 180 tablet, Rfl: 1 .  hydrOXYzine (ATARAX/VISTARIL) 25 MG tablet, Take 1 tablet (25 mg total) by mouth in the morning and at bedtime., Disp: 180 tablet, Rfl: 1 .  insulin glargine (LANTUS) 100 UNIT/ML injection, Inject 1.8 mLs (180 Units total) into the skin daily. (Patient taking differently: Inject 75 Units into the skin 2 (two) times daily. ), Disp: 60 mL, Rfl: 11 .  Insulin Pen Needle (PEN NEEDLES) 31G X 8 MM MISC, 1 Device by Does not apply route daily., Disp: 90 each, Rfl: 3 .  Insulin Syringe-Needle U-100 26G X 1/2" 1 ML MISC, Use daily for insulin injection as directed, Disp: 100 each, Rfl: 3 .  ipratropium-albuterol (DUONEB) 0.5-2.5 (3) MG/3ML SOLN, Take 3 mLs by nebulization every 6 (six) hours as needed (Wheezing or dyspnea.)., Disp: , Rfl:  .  levothyroxine (SYNTHROID) 175 MCG tablet, TAKE 1 TABLET BY MOUTH SIX DAYS A WEEK BEFORE BREAKFAST, Disp: 90 tablet, Rfl: 1 .  metoprolol tartrate (LOPRESSOR) 25 MG tablet, Take 1 tablet (25 mg total) by mouth 2 (two) times daily., Disp: 180 tablet, Rfl: 3 .  mirtazapine (REMERON) 45 MG tablet, mirtazapine 45 mg tablet  TAKE 1 TABLET BY MOUTH EVERYDAY AT BEDTIME, Disp: , Rfl:  .  morphine (MS CONTIN)  30 MG 12 hr tablet, Take 30 mg by mouth 2 (two) times daily., Disp: , Rfl:  .  ondansetron (ZOFRAN-ODT) 4 MG disintegrating tablet, Take 1 tablet (4 mg total) by mouth every 6 (six) hours as needed for nausea or vomiting., Disp: 30 tablet, Rfl: 3 .  PARoxetine (PAXIL) 40 MG tablet, Take 1 tablet (40 mg total) by mouth daily., Disp: 90 tablet, Rfl: 1 .  potassium chloride SA (KLOR-CON M20) 20 MEQ tablet, TAKE 2 TABLETS EVERY DAY AS NEEDED FOR CRAMPING, Disp: 180 tablet, Rfl: 1 .  simvastatin (ZOCOR) 20 MG tablet, Take 1 tablet (20 mg total) by mouth at bedtime., Disp: 30 tablet, Rfl: 6 .  spironolactone (ALDACTONE) 25 MG tablet, Take 1 tablet (25 mg total) by mouth daily. CAUSES PAIN ONLY TAKES WHEN BP IS ELEVATED, Disp: 90 tablet, Rfl: 0 .  tiZANidine (ZANAFLEX) 2 MG tablet, Take 2 mg by mouth as needed for muscle spasms. , Disp: , Rfl: 2  Allergies  Allergen Reactions  . Gabapentin Swelling    Swelling in legs Swelling in legs Swelling in legs  . Losartan Other (See Comments)    Myalgias and muscle cramping Other reaction(s): Other (See Comments) Myalgias and muscle cramping Myalgias and muscle cramping Other reaction(s): Other (See Comments) Myalgias and muscle cramping  . Aricept [Donepezil Hcl]     Nausea/vomiting, low BP  . Oxycodone Itching  . Sulfa Antibiotics Nausea Only and Rash  . Sulfonamide Derivatives Nausea Only      ROS: See pertinent positives and negatives per HPI.   EXAM:  VITALS per patient if applicable: Pulse 74   Temp (!) 96.9 F (36.1 C) (Temporal)   Ht 5\' 9"  (1.753 m)   Wt (!) 290 lb (131.5 kg)   SpO2 94%   BMI 42.83 kg/m  GENERAL: alert, oriented, laying down in bed, appears fatigued. No acute distress  HEENT: atraumatic, conjunctiva clear, no obvious abnormalities on inspection of external nose and ears  NECK: normal movements of the head and neck  LUNGS: on inspection no signs of respiratory distress, breathing rate appears normal, no  obvious gross SOB, gasping or wheezing, mild conversational dyspnea  CV: no obvious cyanosis  PSYCH/NEURO: pleasant and cooperative, no obvious depression or anxiety, speech and thought processing grossly intact   ASSESSMENT AND PLAN: 1. Fever, unspecified fever cause 2. Type 1 diabetes mellitus with hyperglycemia (HCC) 3. Lightheadedness - pt with fever, sore throat, fatigue, cough, weakness, lightheadedness, ? Dehydration, and hyperglycemia episodes x 3 days - pt with OSA, RAD, CHF, type 1 DM along with other chronic medical issues - based on above symptoms and her medical hx, I recommended pt be seen in UC or ER this AM. Pt expressed understanding and agrees. Her husband will take her to UC just down the road from where the live for evaluation    I discussed the assessment and treatment plan with the patient. The patient was provided an opportunity to ask questions and all were answered. The patient agreed with the plan and demonstrated an understanding of the instructions.   The patient was advised to call back or seek an in-person evaluation if the symptoms worsen or if the condition fails to improve as anticipated.   Letta Median, DO

## 2020-03-20 DIAGNOSIS — M353 Polymyalgia rheumatica: Secondary | ICD-10-CM | POA: Diagnosis not present

## 2020-03-20 DIAGNOSIS — G8929 Other chronic pain: Secondary | ICD-10-CM | POA: Diagnosis not present

## 2020-03-20 DIAGNOSIS — Z885 Allergy status to narcotic agent status: Secondary | ICD-10-CM | POA: Diagnosis not present

## 2020-03-20 DIAGNOSIS — Z882 Allergy status to sulfonamides status: Secondary | ICD-10-CM | POA: Diagnosis not present

## 2020-03-20 DIAGNOSIS — Z888 Allergy status to other drugs, medicaments and biological substances status: Secondary | ICD-10-CM | POA: Diagnosis not present

## 2020-03-20 DIAGNOSIS — M47818 Spondylosis without myelopathy or radiculopathy, sacral and sacrococcygeal region: Secondary | ICD-10-CM | POA: Diagnosis not present

## 2020-03-20 DIAGNOSIS — Z79899 Other long term (current) drug therapy: Secondary | ICD-10-CM | POA: Diagnosis not present

## 2020-03-20 DIAGNOSIS — Z794 Long term (current) use of insulin: Secondary | ICD-10-CM | POA: Diagnosis not present

## 2020-03-20 DIAGNOSIS — Z87891 Personal history of nicotine dependence: Secondary | ICD-10-CM | POA: Diagnosis not present

## 2020-03-20 DIAGNOSIS — M533 Sacrococcygeal disorders, not elsewhere classified: Secondary | ICD-10-CM | POA: Diagnosis not present

## 2020-03-31 ENCOUNTER — Encounter: Payer: Self-pay | Admitting: Internal Medicine

## 2020-04-06 ENCOUNTER — Encounter: Payer: Self-pay | Admitting: Pharmacist

## 2020-04-07 ENCOUNTER — Telehealth: Payer: Self-pay | Admitting: Pharmacist

## 2020-04-07 NOTE — Progress Notes (Signed)
Chronic Care Management Pharmacy Assistant   Name: Mary Acosta  MRN: [id number] DOB: 1956/03/12  Reason for Encounter: Medication Review  Patient Questions:  1.  Have you seen any other providers since your last visit? Yes, [name] had a visit with PCP Dr. Quay Burow   2.  Any changes in your medicines or health? No   PCP : Binnie Rail, MD  Allergies:    Allergies  Allergen Reactions  . Gabapentin Swelling    Swelling in legs Swelling in legs Swelling in legs  . Losartan Other (See Comments)    Myalgias and muscle cramping Other reaction(s): Other (See Comments) Myalgias and muscle cramping Myalgias and muscle cramping Other reaction(s): Other (See Comments) Myalgias and muscle cramping  . Aricept [Donepezil Hcl]     Nausea/vomiting, low BP  . Oxycodone Itching  . Sulfa Antibiotics Nausea Only and Rash  . Sulfonamide Derivatives Nausea Only    Medications: Outpatient Encounter Medications as of 04/07/2020  Medication Sig Note  . acetaminophen (TYLENOL) 500 MG tablet Take 1,000 mg by mouth 2 (two) times daily.   Marland Kitchen albuterol (VENTOLIN HFA) 108 (90 Base) MCG/ACT inhaler TAKE 2 PUFFS BY MOUTH EVERY 6 HOURS AS NEEDED FOR WHEEZE OR SHORTNESS OF BREATH (Patient taking differently: Inhale 2 puffs into the lungs every 6 (six) hours as needed for wheezing or shortness of breath. )   . amitriptyline (ELAVIL) 100 MG tablet Take 100 mg by mouth at bedtime.   Marland Kitchen amLODipine (NORVASC) 5 MG tablet Take 1 tablet (5 mg total) by mouth 2 (two) times daily.   Marland Kitchen aspirin EC 81 MG tablet Take 1 tablet (81 mg total) by mouth daily.   . celecoxib (CELEBREX) 100 MG capsule Take by mouth.   . ezetimibe (ZETIA) 10 MG tablet Take 1 tablet (10 mg total) by mouth daily.   . furosemide (LASIX) 40 MG tablet Take 1 tablet (40 mg total) by mouth daily.   Marland Kitchen gabapentin (NEURONTIN) 300 MG capsule Take 300 mg by mouth 3 (three) times daily.    . hydrALAZINE (APRESOLINE) 100 MG tablet Take 1  tablet (100 mg total) by mouth 2 (two) times daily.   . hydrOXYzine (ATARAX/VISTARIL) 25 MG tablet Take 1 tablet (25 mg total) by mouth in the morning and at bedtime.   . insulin glargine (LANTUS) 100 UNIT/ML injection Inject 1.8 mLs (180 Units total) into the skin daily. (Patient taking differently: Inject 75 Units into the skin 2 (two) times daily. ) 02/14/2020: Basaglar via [place] PAP  . Insulin Pen Needle (PEN NEEDLES) 31G X 8 MM MISC 1 Device by Does not apply route daily.   . Insulin Syringe-Needle U-100 26G X 1/2" 1 ML MISC Use daily for insulin injection as directed   . ipratropium-albuterol (DUONEB) 0.5-2.5 (3) MG/3ML SOLN Take 3 mLs by nebulization every 6 (six) hours as needed (Wheezing or dyspnea.).   Marland Kitchen levothyroxine (SYNTHROID) 175 MCG tablet TAKE 1 TABLET BY MOUTH SIX DAYS A WEEK BEFORE BREAKFAST   . metoprolol tartrate (LOPRESSOR) 25 MG tablet Take 1 tablet (25 mg total) by mouth 2 (two) times daily.   . mirtazapine (REMERON) 45 MG tablet mirtazapine 45 mg tablet  TAKE 1 TABLET BY MOUTH EVERYDAY AT BEDTIME 02/14/2020: Stopped taking due to appetite side effect  . morphine (MS CONTIN) 30 MG 12 hr tablet Take 30 mg by mouth 2 (two) times daily.   . ondansetron (ZOFRAN-ODT) 4 MG disintegrating tablet Take 1 tablet (4  mg total) by mouth every 6 (six) hours as needed for nausea or vomiting.   Marland Kitchen PARoxetine (PAXIL) 40 MG tablet Take 1 tablet (40 mg total) by mouth daily.   . potassium chloride SA (KLOR-CON M20) 20 MEQ tablet TAKE 2 TABLETS EVERY DAY AS NEEDED FOR CRAMPING   . simvastatin (ZOCOR) 20 MG tablet Take 1 tablet (20 mg total) by mouth at bedtime.   Marland Kitchen spironolactone (ALDACTONE) 25 MG tablet Take 1 tablet (25 mg total) by mouth daily. CAUSES PAIN ONLY TAKES WHEN BP IS ELEVATED   . tiZANidine (ZANAFLEX) 2 MG tablet Take 2 mg by mouth as needed for muscle spasms.     No facility-administered encounter medications on file as of 04/07/2020.    Current Diagnosis: Patient Active  Problem List   Diagnosis Date Noted  . UTI (urinary tract infection) 03/01/2020  . Grief 02/03/2019  . Nausea 01/04/2019  . Reactive airway disease 10/21/2018  . Arthralgia 09/04/2018  . Narcotic overdose ([place]) 05/14/2018  . Thyroid cancer ([place]) 04/30/2018  . Obesity hypoventilation syndrome ([place]) 09/24/2017  . Palpitations 09/18/2017  . Diabetic neuropathy ([place]) 06/04/2017  . Vitamin B12 deficiency 03/05/2017  . Numbness and tingling in both hands 02/24/2017  . OSA (obstructive sleep apnea) 01/31/2017  . Liver cirrhosis secondary to NASH ([place]) 01/31/2017  . Hypothyroidism 01/31/2017  . Chronic narcotic use 01/31/2017  . Fibromyalgia 01/31/2017  . Hyperlipidemia 01/31/2017  . Gastroparesis   . Memory disorder 07/25/2016  . Acute bronchitis 04/30/2016  . Lump in neck 01/29/2016  . Chronic diastolic (congestive) heart failure ([place]) 01/03/2016  . Fatty liver 01/03/2016  . Type 1 diabetes mellitus ([place]) 01/03/2016  . Recurrent Clostridium difficile diarrhea 01/03/2016  . Chronic respiratory failure ([place]) 11/10/2014  . Physical deconditioning 09/26/2014  . Dyspnea 09/26/2014  . Iron deficiency anemia, unspecified  04/05/2011  . Bariatric surgery status 04/05/2011  . Left arm pain   . UNSPECIFIED VITAMIN D DEFICIENCY 10/22/2007  . LOW BACK PAIN, CHRONIC 10/22/2007  . INSOMNIA 10/22/2007  . Obesity 10/14/2007  . Chronic pain syndrome 10/14/2007  . CARPAL TUNNEL SYNDROME 10/14/2007  . ALLERGIC RHINITIS CAUSE UNSPECIFIED 10/14/2007  . ARTHRITIS 10/14/2007  . Anxiety state 08/25/2007  . Depression 08/25/2007  . Essential hypertension 08/25/2007  . ASTHMA 08/25/2007  . CONSTIPATION 08/25/2007  . POLYMYALGIA RHEUMATICA 08/25/2007  . LEG EDEMA, BILATERAL 08/25/2007    Goals Addressed   None     Follow-Up:  Coordination of Enhanced Pharmacy Services   Reviewed chart for medication changes ahead of medication coordination call.  The patient saw Dr. Quay Burow on 03/01/2020 No  medication changes indicated.  BP Readings from Last 3 Encounters:  03/01/20 124/70  03/01/20 122/64  01/28/20 (!) 140/100    Lab Results  Component Value Date   HGBA1C 11.0 (H) 10/22/2019     Patient obtains medications through Adherence Packaging  30 Days   Last adherence delivery included:  Levothyroxine 175mg  daily- Before Breakfast  Celecoxib100mg   Twice daily-Morning, Bedtime  Hydroxyzine Hcl 25mg  twice daily- Morning, Bedtime  Amlodipine 5mg  twice daily-Morning, Bedtime  Hydralazine 100mg  twice daily Morning, Bedtime  Metoprolol Tartrate 25mg  twice daily- Morning, Bedtime  Paroxetine 40mg  daily- Bedtime  Amitriptyline 100mg  daily- Bedtime  Simvastatin 20mg  daily- Bedtime  Tizanidine 2mg  daily as needed-Vial  Gabapentin 300mg  three times daily- Vial   Patient is due for next adherence delivery on: 04/10/2020. Called patient and reviewed medications and coordinated delivery.    This delivery to include:  Levothyroxine 175mg  daily-  Before Breakfast  Celecoxib100mg   Twice daily-Morning, Bedtime  Hydroxyzine Hcl 25mg  twice daily- Morning, Bedtime  Amlodipine 5mg  twice daily-Morning, Bedtime  Hydralazine 100mg  twice daily Morning, Bedtime  Metoprolol Tartrate 25mg  twice daily- Morning, Bedtime  Paroxetine 40mg  daily- Bedtime  Amitriptyline 100mg  daily- Bedtime  Simvastatin 20mg  daily- Bedtime  Tizanidine 2mg  daily as needed-Vial  Gabapentin 300mg  three times daily- Vial   Patient needs refills for Gabapentin 300mg  prescribed by Dr. Daun Peacock [phone] left a detailed message on nurse line requesting a new order to the pharmacy.   Confirmed delivery date of 04/10/2020, advised patient that pharmacy will contact them the morning of delivery.    Rosendo Gros, [place] Pharmacist Assistant  [phone]

## 2020-04-10 ENCOUNTER — Telehealth: Payer: Self-pay | Admitting: Gastroenterology

## 2020-04-10 NOTE — Telephone Encounter (Signed)
Pt states she has been having pain with bowel movements for about a month. Per pt she states that when she has a BM it feels like "someone if sticking a stake up her butt into her back." Pt thinks she needs to have another colon done. Pt scheduled to see Dr. Loletha Carrow 8/18@10am . Pt aware of appt.

## 2020-04-11 ENCOUNTER — Other Ambulatory Visit: Payer: Self-pay

## 2020-04-11 ENCOUNTER — Ambulatory Visit: Payer: Medicare HMO | Admitting: Pharmacist

## 2020-04-11 DIAGNOSIS — I10 Essential (primary) hypertension: Secondary | ICD-10-CM

## 2020-04-11 DIAGNOSIS — G894 Chronic pain syndrome: Secondary | ICD-10-CM

## 2020-04-11 DIAGNOSIS — E782 Mixed hyperlipidemia: Secondary | ICD-10-CM

## 2020-04-11 DIAGNOSIS — I5032 Chronic diastolic (congestive) heart failure: Secondary | ICD-10-CM

## 2020-04-11 DIAGNOSIS — E1042 Type 1 diabetes mellitus with diabetic polyneuropathy: Secondary | ICD-10-CM

## 2020-04-11 DIAGNOSIS — J449 Chronic obstructive pulmonary disease, unspecified: Secondary | ICD-10-CM | POA: Diagnosis not present

## 2020-04-11 NOTE — Chronic Care Management (AMB) (Signed)
Chronic Care Management Pharmacy  Name: Mary Acosta  MRN: [id number] DOB: 01/19/1956  Chief Complaint/ HPI  Mary Acosta,  64 y.o. , female presents for their Follow-Up CCM visit with the clinical pharmacist via telephone due to COVID-19 Pandemic.  PCP : Binnie Rail, MD  Their chronic conditions include: HTN, diastolic CHF, HLD, OSA, Z0CH, asthma/chronic respiratory failure, NASH, hypothyroidism, neuropathy, hx thyroid cancer, anxiety, fibromyalgia  Pt living with husband and son. She is grieving the sudden death of her 34 year old grandson in May 2020. She follows with [facility] psychiatry and pain management. She has difficulty affording medications.  Office Visits: 03/01/20 Dr Quay Burow OV: acute visit for UTI, elevated sugars. Rx'd Keflex for UTI.  01/21/20 Dr Quay Burow VV: acute cold sx. Start doxycycline, continue inhalers, otc medications.  10/22/19 Dr Quay Burow OV: anxiety/depression related to grandson's death, managed per psych. DM uncontrolled d/t inability to afford insulin. Changed to Lantus.  07/20/19 Dr Quay Burow OV: hospital f/u, no med changes. Hold off statin d/t cirrhosis.  06/16/19 Dr Quay Burow VV: sugars better controlled, mgmt per Dr Loanne Drilling. Struggling with grief, talking to therapist and meds managed per psych.  Consult Visit: 03/20/20 Dr Doren [facility]ustard [facility] spine/neurosurg): steroid injection in hip. 03/18/20 Dr Bryan Lemma (Primary care [facility]): acute visit for fever, sore throat. Rec'd UC or ER visit.  03/01/20 Dr Loletha Carrow (GI): f/u for cirrhosis. Recommended statin for ASCVD due to low risk causing worsening liver function.  02/18/20 cardiology: started simvastatin 20 mg (orders only), due to no improvement in LDL since starting ezetimibe - however prescriber was not aware pt never started taking ezetimibe due to cost. Agreed to hold off on statin until pt can talk to GI.  01/12/20 Dr Elsworth Soho (pulmonary): OSA - failed CPAP. Rec'd sleep upright on wedge pillow, wt loss.  Rx'd furosemide 40 mg x 1 month, PCP for further refills.  01/11/20 [facility] neurosurgery - L knee genicular nerve block.  12/09/19 NP Cleaver (cardiology): BP labile at home, no changes in BP meds. Started ezetimibe 10 mg daily.  08/10/19 Dr Loanne Drilling (endocrine): change Lantus to Toujeo 180 units qAM and apply for PAP. Check BG BID  07/06/19-07/07/19 Hospital admission: atypical chest/back pain, resolved spontaneously. Rec' close f/u with PCP and consider increasing diuretic doses.   06/23/19 Dr Rosario Jacks (GI): liver lesion on [place], too small for characterization. Cirrhosis (compensated), splenomegaly stable, rec'd restart furosemide daily - pt was previously not taking d/t fear of low K.   05/17/19 NP Cleaver (cardiology): continue same meds (including atorvastatin)   Allergies  Allergen Reactions  . Gabapentin Swelling    Swelling in legs Swelling in legs Swelling in legs  . Losartan Other (See Comments)    Myalgias and muscle cramping Other reaction(s): Other (See Comments) Myalgias and muscle cramping Myalgias and muscle cramping Other reaction(s): Other (See Comments) Myalgias and muscle cramping  . Aricept [Donepezil Hcl]     Nausea/vomiting, low BP  . Oxycodone Itching  . Sulfa Antibiotics Nausea Only and Rash  . Sulfonamide Derivatives Nausea Only    Medications: Outpatient Encounter Medications as of 04/11/2020  Medication Sig Note  . acetaminophen (TYLENOL) 500 MG tablet Take 1,000 mg by mouth 2 (two) times daily.   Marland Kitchen albuterol (VENTOLIN HFA) 108 (90 Base) MCG/ACT inhaler TAKE 2 PUFFS BY MOUTH EVERY 6 HOURS AS NEEDED FOR WHEEZE OR SHORTNESS OF BREATH (Patient taking differently: Inhale 2 puffs into the lungs every 6 (six) hours as needed for wheezing or shortness of breath. )   .  amitriptyline (ELAVIL) 100 MG tablet Take 100 mg by mouth at bedtime.   Marland Kitchen amLODipine (NORVASC) 5 MG tablet Take 1 tablet (5 mg total) by mouth 2 (two) times daily.   Marland Kitchen aspirin EC 81 MG tablet Take 1  tablet (81 mg total) by mouth daily.   . celecoxib (CELEBREX) 100 MG capsule Take by mouth.   . ezetimibe (ZETIA) 10 MG tablet Take 1 tablet (10 mg total) by mouth daily.   . furosemide (LASIX) 40 MG tablet Take 1 tablet (40 mg total) by mouth daily.   Marland Kitchen gabapentin (NEURONTIN) 300 MG capsule Take 300 mg by mouth 3 (three) times daily.    . hydrALAZINE (APRESOLINE) 100 MG tablet Take 1 tablet (100 mg total) by mouth 2 (two) times daily.   . hydrOXYzine (ATARAX/VISTARIL) 25 MG tablet Take 1 tablet (25 mg total) by mouth in the morning and at bedtime.   . insulin glargine (LANTUS) 100 UNIT/ML injection Inject 1.8 mLs (180 Units total) into the skin daily. (Patient taking differently: Inject 75 Units into the skin 2 (two) times daily. ) 02/14/2020: Basaglar via [place] PAP  . Insulin Pen Needle (PEN NEEDLES) 31G X 8 MM MISC 1 Device by Does not apply route daily.   . Insulin Syringe-Needle U-100 26G X 1/2" 1 ML MISC Use daily for insulin injection as directed   . ipratropium-albuterol (DUONEB) 0.5-2.5 (3) MG/3ML SOLN Take 3 mLs by nebulization every 6 (six) hours as needed (Wheezing or dyspnea.).   Marland Kitchen levothyroxine (SYNTHROID) 175 MCG tablet TAKE 1 TABLET BY MOUTH SIX DAYS A WEEK BEFORE BREAKFAST   . metoprolol tartrate (LOPRESSOR) 25 MG tablet Take 1 tablet (25 mg total) by mouth 2 (two) times daily.   Marland Kitchen morphine (MS CONTIN) 30 MG 12 hr tablet Take 30 mg by mouth 2 (two) times daily.   . ondansetron (ZOFRAN-ODT) 4 MG disintegrating tablet Take 1 tablet (4 mg total) by mouth every 6 (six) hours as needed for nausea or vomiting.   Marland Kitchen PARoxetine (PAXIL) 40 MG tablet Take 1 tablet (40 mg total) by mouth daily.   . potassium chloride SA (KLOR-CON M20) 20 MEQ tablet TAKE 2 TABLETS EVERY DAY AS NEEDED FOR CRAMPING   . tiZANidine (ZANAFLEX) 2 MG tablet Take 2 mg by mouth as needed for muscle spasms.    . mirtazapine (REMERON) 45 MG tablet mirtazapine 45 mg tablet  TAKE 1 TABLET BY MOUTH EVERYDAY AT BEDTIME  (Patient not taking: Reported on 04/11/2020) 02/14/2020: Stopped taking due to appetite side effect  . simvastatin (ZOCOR) 20 MG tablet Take 1 tablet (20 mg total) by mouth at bedtime.   Marland Kitchen spironolactone (ALDACTONE) 25 MG tablet Take 1 tablet (25 mg total) by mouth daily. CAUSES PAIN ONLY TAKES WHEN BP IS ELEVATED (Patient not taking: Reported on 04/11/2020)    No facility-administered encounter medications on file as of 04/11/2020.     Current Diagnosis/Assessment:    Goals Addressed            This Visit's Progress   . Pharmacy Care Plan       CARE PLAN ENTRY (see longitudinal plan of care for additional care plan information)  Current Barriers:  . Chronic Disease Management support, education, and care coordination needs related to Hypertension, Hyperlipidemia, Diabetes, Heart Failure, Depression, and Anxiety   Hypertension / Diastolic heart failure BP Readings from Last 3 Encounters:  03/01/20 124/70  03/01/20 122/64  01/28/20 (!) 140/100 .  Pharmacist Clinical Goal(s): o Over the  next 30 days, patient will work with PharmD and providers to maintain BP goal <130/80 . Current regimen:  o amlodipine 5 mg twice a day o spironolactone 25 mg daily as needed for high BP o Hydralazine 100 mg twice a day o metoprolol tartrate 25 mg twice a day o Furosemide 20 mg as needed for swelling . Interventions: o Discussed benefits of medications and importance of adherence  . Patient self care activities - Over the next 60 days, patient will: o Check BP daily, document, and provide at future appointments o Ensure daily salt intake < 2300 mg/day  Hyperlipidemia Lab Results  Component Value Date/Time   LDLCALC 135 (H) 02/17/2020 11:16 AM .  Pharmacist Clinical Goal(s): o Over the next 30 days, patient will work with PharmD and providers to achieve LDL goal < 100 . Current regimen:  o Simvastatin 20 mg daily  . Interventions: o Discussed cholesterol goals and benefits of medications  for prevention of heart attack / stroke o Discussed statins are safe and effective in liver cirrhosis due to NASH . Patient self care activities - Over the next 30 days, patient will: o Continue low cholesterol diet o Continue medication as prescribed  Diabetes Lab Results  Component Value Date/Time   HGBA1C 11.0 (H) 10/22/2019 02:11 PM   HGBA1C 9.4 (H) 07/20/2019 01:51 PM .  Pharmacist Clinical Goal(s): o Over the next 30 days, patient will work with PharmD and providers to achieve A1c goal <7% and fasting blood sugar 80-130 . Current regimen:  o Basaglar 75 unit twice a day . Interventions: o Discussed A1c goal and benefits of maintaining blood sugar at goal o Basaglar approved for free via [employer] patient assistance through end of 2021 o Recommend C-Peptide to clarify Type 1 diabetes diagnosis and determine if medications other than insulin may be beneficial . Patient self care activities - Over the next 30 days, patient will: o Check blood sugar twice daily, document, and provide at future appointments o Contact provider with any episodes of hypoglycemia  Chronic pain / Arthritis . Pharmacist Clinical Goal(s) o Over the next 30 days, patient will work with PharmD and providers to optimize therapy and reduce cost . Current regimen:  o morphine ER 30 mg every 12 hours o celecoxib 100 mg twice a day o Tylenol 1000 mg twice a day o tizanidine 2 mg every 6 hrs as needed o Gabapentin 300 mg 3 times daily as needed . Interventions: o Recommended to use GoodRx [facility] gabapentin - at [facility], 30 day supply would be ~$15 . Patient self care activities - Over the next 30 days, patient will: o Continue medication as prescribed o Discuss reducing morphine dose with [facility] pain management  Medication management . Pharmacist Clinical Goal(s): o Over the next 30 days, patient will work with PharmD and providers to achieve optimal medication adherence . [facility] . Interventions o Comprehensive medication review performed. o Utilize [facility] pharmacy for medication synchronization, packaging and delivery . Patient self care activities - Over the next 30 days, patient will: o Focus on medication adherence by pill pack o Take medications as prescribed o Report any questions or concerns to PharmD and/or provider(s)  Please see past updates related to this goal by clicking on the "Past Updates" button in the selected goal        Diabetes (Type 1)   A1c goal < 8% Dx DM2 2000, converted to Type 1 at some point?  Recent Relevant Labs: Lab  Results  Component Value Date/Time   HGBA1C 11.0 (H) 10/22/2019 02:11 PM   HGBA1C 9.4 (H) 07/20/2019 01:51 PM   MICROALBUR <0.7 09/19/2017 02:26 PM   MICROALBUR 0.8 02/05/2017 11:09 AM   Last diabetic Foot exam:  Lab Results  Component Value Date/Time   HMDIABEYEEXA No Retinopathy 05/29/2016 12:00 AM    Last diabetic Eye exam: No results found for: HMDIABFOOTEX   Checking BG: 2x per Day   Fasting BG: "high"  Patient has failed these meds in past: Toujeo, NPH, Basaglar, Lantus, metformin, Actos Patient is currently uncontrolled on the following medications:   Basaglar 75 units BID (via [employer])  We discussed: Pt has been injecting 150 units at once, discussed the skin can only absorb maximum of 80 units at a time. Pt is currently over-basalized (1.1 unit/kg, when max is 0.5 units/kg) so she will require additional bolus or another insulin-sparing agent.   Also discussed diabetes Type 1 diagnosis - pt was unaware why her diagnosis was changed from Type 2. She was originally diagnosed with T2DM around 2000, tried metformin and Actos before starting insulin around 2010, and has been on insulin only ever since. (Also notably, she never had good insurance coverage until recently, and frequently did not actually have access to insulin)   There is no C-peptide available in this chart (although this  was probably checked pre-Epic). It may be beneficial to check a C-peptide again to further clarify which medication options may be helpful, particularly because GLP-1 and SGLT-2 with cardiovascular and weight loss benefits are now available, which were not when she was first treated with oral meds, and pt can get these through patient assistance if needed.  Plan  Counseled to split Basaglar dose to 75 units BID Recommend C-Peptide to clarify Type 1 diagnosis Consider Humalog vs GLP-1 depending on result Recommend repeat A1c (last 09/2019)  Hypertension / Diastolic HF   HF type: Diastolic Last ejection fraction: 60-65% (06/2019)  BP goal < 130/80  Office blood pressures are  BP Readings from Last 3 Encounters:  03/01/20 124/70  03/01/20 122/64  01/28/20 (!) 140/100   Kidney Function Lab Results  Component Value Date/Time   CREATININE 0.98 10/22/2019 02:11 PM   CREATININE 0.87 07/20/2019 01:51 PM   GFR 57.25 (L) 10/22/2019 02:11 PM   GFRNONAA >60 07/06/2019 07:22 PM   GFRAA >60 07/06/2019 07:22 PM   K 4.8 10/22/2019 02:11 PM   K 4.2 07/20/2019 01:51 PM   Patient has failed these meds in the past: HCTZ, valsartan, losartan, lisinopril Patient is currently uncontrolled on the following medications:   amlodipine 5 mg BID,   spironolactone 25 mg PRN when BP elevated  hydralazine 100 mg BID  metoprolol tartrate 25 mg BID   Furosemide 40 mg as needed  potassium 40 mEq PRN (cramping)  We discussed: Pt is taking medications as prescribed and BP is fairly well controlled. Pt had issues with labile BP in the past, important to continue monitoring at home.   In HFpEF, only metoprolol succinate (not tartrate) has beneficial effects for morbidity. May consider switching to succinate for HF benefits.  Plan  Continue current medications  Continue home BP monitoring  Hyperlipidemia   LDL goal < 70 Aortic atherosclerosis  Lipid Panel     Component Value Date/Time   CHOL  193 02/17/2020 1116   TRIG 54 02/17/2020 1116   HDL 48 02/17/2020 1116   CHOLHDL 4.0 02/17/2020 1116   CHOLHDL 5 10/22/2019 1411  VLDL 35.8 10/22/2019 1411   LDLCALC 135 (H) 02/17/2020 1116   LABVLDL 10 02/17/2020 1116    The ASCVD Risk score (Goff DC Jr., et al., 2013) failed to calculate for the following reasons:   The patient has a prior MI or stroke diagnosis  Patient has tried/failed these meds in past: ezetimibe Patient is currently uncontrolled on the following medications:   aspirin 81 mg daily  Simvastatin 20 mg daily  We discussed:  Pt is now taking simvastatin after discussions with cardiology and GI, all agree that statin is beneficial for ASCVD risk reduction in NASH. Ezetimibe was put on hold.  Plan  Continue current medications   Depression/anxiety   Depression screen [facility] 2/9 01/28/2020 09/04/2018 06/13/2017  Decreased Interest 1 0 0  Down, Depressed, Hopeless 0 0 0  PHQ - 2 Score 1 0 0  Some recent data might be hidden   Patient has failed these meds in past: sertraline, Pristiq, buspirone. Hydroxyzine, alprazolam, duloxetine, diazepam, mirtazapine Patient is currently controlled on the following medications:   paroxetine 40 mg - PM  amitriptyline 100 mg HS  We discussed: Pt reports good control over mood and bladder spasms with this combination. She stopped taking mirtazapine due to increase in appetite, and denies issues since stopping it.   Plan  Continue current medications   Chronic pain/ arthritis/ fibromyalgia   Patient has failed these meds in past: gabapentin, meloxicam Patient is currently uncontrolled on the following medications:   morphine ER 30 mg q12h,   celecoxib 100 mg BID,   tylenol 1000 mg BID,   tizanidine 2 mg PRN  Gabapentin 300 mg TID prn   Hydroxyzine 25 mg BID  We discussed: pt is trying to wean off of morphine, using NSAIDs to help with pain. She is going to talk to pain clinic about reducing her morphine to 15  mg.  She reports gabapentin is no longer causing leg swelling.  Plan  Continue current medications    Medication Management   Pt uses [facility] for all medications 30-day pill packs Pt endorses compliance with medications as prescribed  We discussed:  Reviewed patient's [facility] medication and Epic medication profile assuring there are no discrepancies or gaps in therapy. Confirmed all fill dates appropriate and verified with patient that there is a sufficient quantity of all prescribed medications at home. Informed patient to call me any time if needing medications before scheduled deliveries. The anticipated medication sync date was 04/10/20.  Plan  Utilize [facility] pharmacy for medication synchronization, packaging and delivery    Follow up: 1 month phone visit  Charlene Brooke, PharmD Clinical Pharmacist [facility] at [facility] [phone]

## 2020-04-11 NOTE — Patient Instructions (Addendum)
Visit Information  Phone number for Pharmacist: [phone]  Goals Addressed            This Visit's Progress   . Pharmacy Care Plan       CARE PLAN ENTRY (see longitudinal plan of care for additional care plan information)  Current Barriers:  . Chronic Disease Management support, education, and care coordination needs related to Hypertension, Hyperlipidemia, Diabetes, Heart Failure, Depression, and Anxiety   Hypertension / Diastolic heart failure BP Readings from Last 3 Encounters:  03/01/20 124/70  03/01/20 122/64  01/28/20 (!) 140/100 .  Pharmacist Clinical Goal(s): o Over the next 30 days, patient will work with PharmD and providers to maintain BP goal <130/80 . Current regimen:  o amlodipine 5 mg twice a day o spironolactone 25 mg daily as needed for high BP o Hydralazine 100 mg twice a day o metoprolol tartrate 25 mg twice a day o Furosemide 20 mg as needed for swelling . Interventions: o Discussed benefits of medications and importance of adherence  . Patient self care activities - Over the next 60 days, patient will: o Check BP daily, document, and provide at future appointments o Ensure daily salt intake < 2300 mg/day  Hyperlipidemia Lab Results  Component Value Date/Time   LDLCALC 135 (H) 02/17/2020 11:16 AM .  Pharmacist Clinical Goal(s): o Over the next 30 days, patient will work with PharmD and providers to achieve LDL goal < 100 . Current regimen:  o Simvastatin 20 mg daily  . Interventions: o Discussed cholesterol goals and benefits of medications for prevention of heart attack / stroke o Discussed statins are safe and effective in liver cirrhosis due to NASH . Patient self care activities - Over the next 30 days, patient will: o Continue low cholesterol diet o Continue medication as prescribed  Diabetes Lab Results  Component Value Date/Time   HGBA1C 11.0 (H) 10/22/2019 02:11 PM   HGBA1C 9.4 (H) 07/20/2019 01:51 PM .  Pharmacist Clinical  Goal(s): o Over the next 30 days, patient will work with PharmD and providers to achieve A1c goal <7% and fasting blood sugar 80-130 . Current regimen:  o Basaglar 75 unit twice a day . Interventions: o Discussed A1c goal and benefits of maintaining blood sugar at goal o Basaglar approved for free via [employer] patient assistance through end of 2021 o Recommend C-Peptide to clarify Type 1 diabetes diagnosis and determine if medications other than insulin may be beneficial . Patient self care activities - Over the next 30 days, patient will: o Check blood sugar twice daily, document, and provide at future appointments o Contact provider with any episodes of hypoglycemia  Chronic pain / Arthritis . Pharmacist Clinical Goal(s) o Over the next 30 days, patient will work with PharmD and providers to optimize therapy and reduce cost . Current regimen:  o morphine ER 30 mg every 12 hours o celecoxib 100 mg twice a day o Tylenol 1000 mg twice a day o tizanidine 2 mg every 6 hrs as needed o Gabapentin 300 mg 3 times daily as needed . Interventions: o Recommended to use GoodRx [facility]abapentin - [facility], 30 day supply would be ~$15 . Patient self care activities - Over the next 30 days, patient will: o Continue medication as prescribed o Discuss reducing morphine dose with [facility] pain management  Medication management . Pharmacist Clinical Goal(s): o Over the next 30 days, patient will work with PharmD and providers to achieve optimal medication adherence . [facility] .  Interventions o Comprehensive medication review performed. o Utilize [facility] pharmacy for medication synchronization, packaging and delivery . Patient self care activities - Over the next 30 days, patient will: o Focus on medication adherence by pill pack o Take medications as prescribed o Report any questions or concerns to PharmD and/or provider(s)  Please see past updates related to this goal by clicking on the  "Past Updates" button in the selected goal       Patient verbalizes understanding of instructions provided today.   Telephone follow up appointment with pharmacy team member scheduled for: 1 month  Charlene Brooke, PharmD Clinical Pharmacist [facility] at [facility] [phone]  C-Peptide Test Why am I having this test? The C-peptide test may be used to evaluate your condition if you have diabetes. The test can help your health care provider determine the cause of low blood sugar (hypoglycemia). This test may also be used to help monitor tumors of the insulin-secreting cells of the pancreas (insulinomas). C-peptide levels may help your health care provider understand how your pancreas is functioning in the following situations:  If you take insulin and have anti-insulin antibodies.  If you secretly administer insulin to yourself (factitious hypoglycemia).  If you take insulin to manage your diabetes.  If your health care provider needs to distinguish whether you have type 1 or type 2 diabetes. What is being tested? This test measures the level of C-peptide in your blood. Insulin is released into the bloodstream to help transport glucose into the body's cells for use in energy production. When insulin is released, equal amounts of C-peptide are also released. This makes C-peptide useful in evaluating insulin production. What kind of sample is taken?  A blood sample is required for this test. It is usually collected by inserting a needle into a blood vessel. How do I prepare for this test? Do not eat or drink anything after midnight on the night before the procedure, or as directed by your health care provider. How are the results reported? Your test results will be reported as values. Your health care provider will compare your results to normal ranges that were established after testing a large group of people (reference ranges). Reference ranges may vary among labs  and hospitals. For this test, a common reference range is:  Fasting: 0.78-1.89 ng/mL or 0.26-0.62 nmol/L (SI units). What do the results mean?  Increased levels of C-peptide can be seen in patients: ? With insulinoma. ? Who have had a pancreas transplant. ? Who have kidney failure. ? With type 2 diabetes mellitus. ? Who take certain oral medications that lower blood sugar (oral hypoglycemic agents).  Decreased levels of C-peptide can be seen in patients: ? With factitious hypoglycemia. ? With type 1 diabetes mellitus. ? Who have had their entire pancreas surgically removed (total pancreatectomy). Talk with your health care provider about what your results mean. Questions to ask your health care provider Ask your health care provider, or the department that is doing the test:  When will my results be ready?  How will I get my results?  What are my treatment options?  What other tests do I need?  What are my next steps? Summary  C-peptide is a protein that is released with insulin. Measuring C-peptide can be useful in evaluating insulin production in the body.  This test can help your health care provider determine the cause of low blood sugar (hypoglycemia). This test may also be used to help monitor tumors of the  insulin-secreting cells of the pancreas.  Talk to your health care provider about what your results may mean. This information is not intended to replace advice given to you by your health care provider. Make sure you discuss any questions you have with your health care provider. Document Revised: 07/25/2017 Document Reviewed: 04/17/2017 Elsevier Patient Education  2020 [employer].

## 2020-04-12 ENCOUNTER — Telehealth: Payer: Self-pay | Admitting: Gastroenterology

## 2020-04-12 ENCOUNTER — Ambulatory Visit: Payer: Medicare HMO | Admitting: Gastroenterology

## 2020-04-12 NOTE — Telephone Encounter (Signed)
Pt called and cancelled her appt for this am, states her stomach was so upset she could not come in. Appt rescheduled for tomorrow at 10am, pt states she will make this appt.

## 2020-04-12 NOTE — Progress Notes (Deleted)
[facility] Progress Note  Chief Complaint: ***  Subjective  History: Last office visit early July, at which time her complex history of cirrhosis and other issues was documented. Last colonoscopy 2009 for constipation that was felt likely due to chronic opioids.  Normal exam other than internal hemorrhoids. Naiomi called the office this week complaining of about a month of rectal pain. ***  ROS: Cardiovascular:  no chest pain Respiratory: no dyspnea  The patient's Past Medical, Family and Social History were reviewed and are on file in the EMR.  Objective:  Med list reviewed  Current Outpatient Medications:  .  acetaminophen (TYLENOL) 500 MG tablet, Take 1,000 mg by mouth 2 (two) times daily., Disp: , Rfl:  .  albuterol (VENTOLIN HFA) 108 (90 Base) MCG/ACT inhaler, TAKE 2 PUFFS BY MOUTH EVERY 6 HOURS AS NEEDED FOR WHEEZE OR SHORTNESS OF BREATH (Patient taking differently: Inhale 2 puffs into the lungs every 6 (six) hours as needed for wheezing or shortness of breath. ), Disp: 18 g, Rfl: 11 .  amitriptyline (ELAVIL) 100 MG tablet, Take 100 mg by mouth at bedtime., Disp: , Rfl:  .  amLODipine (NORVASC) 5 MG tablet, Take 1 tablet (5 mg total) by mouth 2 (two) times daily., Disp: 180 tablet, Rfl: 1 .  aspirin EC 81 MG tablet, Take 1 tablet (81 mg total) by mouth daily., Disp: 90 tablet, Rfl: 3 .  celecoxib (CELEBREX) 100 MG capsule, Take by mouth., Disp: , Rfl:  .  ezetimibe (ZETIA) 10 MG tablet, Take 1 tablet (10 mg total) by mouth daily., Disp: 30 tablet, Rfl: 1 .  furosemide (LASIX) 40 MG tablet, Take 1 tablet (40 mg total) by mouth daily., Disp: 30 tablet, Rfl: 0 .  gabapentin (NEURONTIN) 300 MG capsule, Take 300 mg by mouth 3 (three) times daily. , Disp: , Rfl:  .  hydrALAZINE (APRESOLINE) 100 MG tablet, Take 1 tablet (100 mg total) by mouth 2 (two) times daily., Disp: 180 tablet, Rfl: 1 .  hydrOXYzine (ATARAX/VISTARIL) 25 MG tablet, Take 1 tablet (25 mg total) by mouth  in the morning and at bedtime., Disp: 180 tablet, Rfl: 1 .  insulin glargine (LANTUS) 100 UNIT/ML injection, Inject 1.8 mLs (180 Units total) into the skin daily. (Patient taking differently: Inject 75 Units into the skin 2 (two) times daily. ), Disp: 60 mL, Rfl: 11 .  Insulin Pen Needle (PEN NEEDLES) 31G X 8 MM MISC, 1 Device by Does not apply route daily., Disp: 90 each, Rfl: 3 .  Insulin Syringe-Needle U-100 26G X 1/2" 1 ML MISC, Use daily for insulin injection as directed, Disp: 100 each, Rfl: 3 .  ipratropium-albuterol (DUONEB) 0.5-2.5 (3) MG/3ML SOLN, Take 3 mLs by nebulization every 6 (six) hours as needed (Wheezing or dyspnea.)., Disp: , Rfl:  .  levothyroxine (SYNTHROID) 175 MCG tablet, TAKE 1 TABLET BY MOUTH SIX DAYS A WEEK BEFORE BREAKFAST, Disp: 90 tablet, Rfl: 1 .  metoprolol tartrate (LOPRESSOR) 25 MG tablet, Take 1 tablet (25 mg total) by mouth 2 (two) times daily., Disp: 180 tablet, Rfl: 3 .  mirtazapine (REMERON) 45 MG tablet, mirtazapine 45 mg tablet  TAKE 1 TABLET BY MOUTH EVERYDAY AT BEDTIME (Patient not taking: Reported on 04/11/2020), Disp: , Rfl:  .  morphine (MS CONTIN) 30 MG 12 hr tablet, Take 30 mg by mouth 2 (two) times daily., Disp: , Rfl:  .  ondansetron (ZOFRAN-ODT) 4 MG disintegrating tablet, Take 1 tablet (4 mg total) by mouth every  6 (six) hours as needed for nausea or vomiting., Disp: 30 tablet, Rfl: 3 .  PARoxetine (PAXIL) 40 MG tablet, Take 1 tablet (40 mg total) by mouth daily., Disp: 90 tablet, Rfl: 1 .  potassium chloride SA (KLOR-CON M20) 20 MEQ tablet, TAKE 2 TABLETS EVERY DAY AS NEEDED FOR CRAMPING, Disp: 180 tablet, Rfl: 1 .  simvastatin (ZOCOR) 20 MG tablet, Take 1 tablet (20 mg total) by mouth at bedtime., Disp: 30 tablet, Rfl: 6 .  spironolactone (ALDACTONE) 25 MG tablet, Take 1 tablet (25 mg total) by mouth daily. CAUSES PAIN ONLY TAKES WHEN BP IS ELEVATED (Patient not taking: Reported on 04/11/2020), Disp: 90 tablet, Rfl: 0 .  tiZANidine (ZANAFLEX) 2 MG  tablet, Take 2 mg by mouth as needed for muscle spasms. , Disp: , Rfl: 2   Vital signs in last 24 hrs: There were no vitals filed for this visit.  Physical Exam  ***  HEENT: sclera anicteric, oral mucosa moist without lesions  Neck: supple, no thyromegaly, JVD or lymphadenopathy  Cardiac: RRR without murmurs, S1S2 heard, no peripheral edema  Pulm: clear to auscultation bilaterally, normal RR and effort noted  Abdomen: soft, *** tenderness, with active bowel sounds. No guarding or palpable hepatosplenomegaly.  Skin; warm and dry, no jaundice or rash  Labs:   ___________________________________________ Radiologic studies:   ____________________________________________ Other:   _____________________________________________ Assessment & Plan  Assessment: No diagnosis found.    Plan:    *** minutes were spent on this encounter (including chart review, history/exam, counseling/coordination of care, and documentation)  [name]

## 2020-04-13 ENCOUNTER — Ambulatory Visit: Payer: Medicare HMO | Admitting: Gastroenterology

## 2020-04-13 ENCOUNTER — Encounter: Payer: Self-pay | Admitting: Gastroenterology

## 2020-04-13 VITALS — BP 142/84 | HR 69 | Ht 69.0 in | Wt 301.1 lb

## 2020-04-13 DIAGNOSIS — R194 Change in bowel habit: Secondary | ICD-10-CM | POA: Diagnosis not present

## 2020-04-13 DIAGNOSIS — K7460 Unspecified cirrhosis of liver: Secondary | ICD-10-CM | POA: Diagnosis not present

## 2020-04-13 DIAGNOSIS — R140 Abdominal distension (gaseous): Secondary | ICD-10-CM

## 2020-04-13 DIAGNOSIS — K6289 Other specified diseases of anus and rectum: Secondary | ICD-10-CM | POA: Diagnosis not present

## 2020-04-13 MED ORDER — METRONIDAZOLE 500 MG PO TABS: 500.0000 mg | ORAL_TABLET | Freq: Two times a day (BID) | ORAL | 0 refills | Status: DC

## 2020-04-13 NOTE — Patient Instructions (Signed)
If you are age 65 or older, your body mass index should be between 23-30. Your Body mass index is 44.47 kg/m. If this is out of the aforementioned range listed, please consider follow up with your Primary Care Provider.  If you are age 21 or younger, your body mass index should be between 19-25. Your Body mass index is 44.47 kg/m. If this is out of the aformentioned range listed, please consider follow up with your Primary Care Provider.   Your provider has ordered Cologuard testing as an option for colon cancer screening. This is performed by [employer] and may be out of network with your insurance. PRIOR to completing the test, it is YOUR responsibility to contact your insurance about covered benefits for this test. Your out of pocket expense could be anywhere from $0.00 to $649.00.   When you call to check coverage with your insurer, please provide the following information:   -The ONLY provider of Cologuard is [place] code for Cologuard is [phone].  [employer] NPI # [id number]  -[employer] Tax ID # [id number]   We have already sent your demographic and insurance information to [employer] [facility] (phone number [phone]) and they should contact you within the next week regarding your test. If you have not heard from them within the next week, please call our office at [phone].  It was a pleasure to see you today!  Dr. Loletha Carrow

## 2020-04-13 NOTE — Progress Notes (Signed)
GI Progress Note  Chief Complaint: Rectal pain and altered bowel habits  Subjective  History: Soft last office visit 03/01/2020, extensive note outlining chronic multiple conditions. Last colonoscopy April 2009.  Mary Acosta had an appointment with me yesterday which she canceled due to diarrhea, then rescheduled to today.  She complained about generally the same chronic digestive symptoms she has had for quite some time, with bloating, generalized abdominal discomfort, and primarily constipation but with episodes of urgent loose stool.  She denies rectal bleeding.  Mary Acosta was concerned because with some diarrhea a couple days ago she had a feeling of rectal spasm and pain that went into her lower back.  She went to her pain management provider yesterday because she felt something abnormal on her lower back and they recommended I examine it.  ROS: Cardiovascular:  no chest pain Respiratory: no dyspnea Chronic pain Depression and anxiety Remainder systems negative except as above The patient's Past Medical, Family and Social History were reviewed and are on file in the EMR.  Objective:  Med list reviewed  Current Outpatient Medications:  .  acetaminophen (TYLENOL) 500 MG tablet, Take 1,000 mg by mouth 2 (two) times daily., Disp: , Rfl:  .  albuterol (VENTOLIN HFA) 108 (90 Base) MCG/ACT inhaler, TAKE 2 PUFFS BY MOUTH EVERY 6 HOURS AS NEEDED FOR WHEEZE OR SHORTNESS OF BREATH, Disp: 18 g, Rfl: 11 .  amitriptyline (ELAVIL) 100 MG tablet, Take 100 mg by mouth at bedtime., Disp: , Rfl:  .  amLODipine (NORVASC) 5 MG tablet, Take 1 tablet (5 mg total) by mouth 2 (two) times daily., Disp: 180 tablet, Rfl: 1 .  aspirin EC 81 MG tablet, Take 1 tablet (81 mg total) by mouth daily., Disp: 90 tablet, Rfl: 3 .  celecoxib (CELEBREX) 100 MG capsule, Take 100 mg by mouth 2 (two) times daily. , Disp: , Rfl:  .  ezetimibe (ZETIA) 10 MG tablet, Take 1 tablet (10 mg total) by mouth daily., Disp:  30 tablet, Rfl: 1 .  furosemide (LASIX) 40 MG tablet, Take 1 tablet (40 mg total) by mouth daily., Disp: 30 tablet, Rfl: 0 .  gabapentin (NEURONTIN) 300 MG capsule, Take 300 mg by mouth 3 (three) times daily. , Disp: , Rfl:  .  hydrALAZINE (APRESOLINE) 100 MG tablet, Take 1 tablet (100 mg total) by mouth 2 (two) times daily., Disp: 180 tablet, Rfl: 1 .  hydrOXYzine (ATARAX/VISTARIL) 25 MG tablet, Take 1 tablet (25 mg total) by mouth in the morning and at bedtime., Disp: 180 tablet, Rfl: 1 .  insulin glargine (LANTUS) 100 UNIT/ML injection, Inject 1.8 mLs (180 Units total) into the skin daily. (Patient taking differently: Inject 75 Units into the skin 2 (two) times daily. ), Disp: 60 mL, Rfl: 11 .  Insulin Pen Needle (PEN NEEDLES) 31G X 8 MM MISC, 1 Device by Does not apply route daily., Disp: 90 each, Rfl: 3 .  Insulin Syringe-Needle U-100 26G X 1/2" 1 ML MISC, Use daily for insulin injection as directed, Disp: 100 each, Rfl: 3 .  ipratropium-albuterol (DUONEB) 0.5-2.5 (3) MG/3ML SOLN, Take 3 mLs by nebulization every 6 (six) hours as needed (Wheezing or dyspnea.)., Disp: , Rfl:  .  levothyroxine (SYNTHROID) 175 MCG tablet, TAKE 1 TABLET BY MOUTH SIX DAYS A WEEK BEFORE BREAKFAST, Disp: 90 tablet, Rfl: 1 .  metoprolol tartrate (LOPRESSOR) 25 MG tablet, Take 1 tablet (25 mg total) by mouth 2 (two) times daily., Disp: 180 tablet, Rfl: 3 .  mirtazapine (REMERON) 30 MG tablet, Take 30 mg by mouth daily., Disp: , Rfl:  .  morphine (MS CONTIN) 30 MG 12 hr tablet, Take 30 mg by mouth 2 (two) times daily., Disp: , Rfl:  .  ondansetron (ZOFRAN-ODT) 4 MG disintegrating tablet, Take 1 tablet (4 mg total) by mouth every 6 (six) hours as needed for nausea or vomiting., Disp: 30 tablet, Rfl: 3 .  PARoxetine (PAXIL) 40 MG tablet, Take 1 tablet (40 mg total) by mouth daily., Disp: 90 tablet, Rfl: 1 .  potassium chloride SA (KLOR-CON M20) 20 MEQ tablet, TAKE 2 TABLETS EVERY DAY AS NEEDED FOR CRAMPING, Disp: 180 tablet,  Rfl: 1 .  tiZANidine (ZANAFLEX) 2 MG tablet, Take 2 mg by mouth as needed for muscle spasms. , Disp: , Rfl: 2 .  metroNIDAZOLE (FLAGYL) 500 MG tablet, Take 1 tablet (500 mg total) by mouth 2 (two) times daily., Disp: 20 tablet, Rfl: 0   Vital signs in last 24 hrs: Vitals:   04/13/20 0958  BP: (!) 142/84  Pulse: 69  SpO2: 95%    Physical Exam  Chronically ill-appearing, morbidly obese  HEENT: sclera anicteric, oral mucosa moist without lesions  Neck: supple, no thyromegaly, JVD or lymphadenopathy  Cardiac: RRR without murmurs, S1S2 heard, no peripheral edema  Pulm: clear to auscultation bilaterally, normal RR and effort noted  Abdomen: soft, mild scattered tenderness, with active bowel sounds. No guarding or palpable hepatosplenomegaly, though limited by body habitus  Skin; warm and dry, no jaundice or rash In left lateral decubitus position, she has a 2 cm subcutaneous mobile fullness just left of the superior gluteal cleft, no surrounding erythema.  Another provider has circled it with a pen.  It is not tender. Rectal: Normal external perianal exam, no fissure, tenderness or palpable internal lesion.  Labs:   ___________________________________________ Radiologic studies:   ____________________________________________ Other:   _____________________________________________ Assessment & Plan  Assessment: Encounter Diagnoses  Name Primary?  . Rectal pain Yes  . Abdominal bloating   . Cirrhosis of liver without ascites, unspecified hepatic cirrhosis type ([place])   . Altered bowel habits     Longstanding abdominal pain and bloating with altered bowel habits related to her multiple complex medical issues, chronic opioids, poorly controlled diabetes.  It sounds like she had some rectal spasm during diarrhea, something which has occurred in the past.  This was radiated up into the back she had concerns it was related to her lower back problem. The subcutaneous lesion is  probably cystic and benign.  If it continues to bother her and certainly if it enlarges, I recommend a primary care evaluation.  Plan: I did my best to explain to Mary Acosta, as I have done in the past, the treatment options are limited due to the nature of her digestive conditions.  She knows not to take Imodium because will precipitate constipation.  She is bothered by bloating and gas which is primarily a motility issue, but could be something like bacterial overgrowth.  She certainly has the risk factors on chronic opioids and poorly controlled diabetes.  I gave her a 10-day course of metronidazole 500 mg twice daily  I would favor her using Pepto-Bismol rather than Imodium for treatment of intermittent diarrhea.  She asked about colon cancer screening since her last colonoscopy had been in 2009.  Given her medical complexity and procedural risks, I recommended a Cologuard instead.  If positive, her colonoscopy would need to be done in the hospital endoscopy lab.  30 minutes were spent on this encounter (including chart review, history/exam, counseling/coordination of care, and documentation)  [name]

## 2020-04-17 NOTE — Telephone Encounter (Signed)
Late docmentation - she had HRCT Aug 2019 without ILD . wioll close out message

## 2020-04-25 DIAGNOSIS — Z1211 Encounter for screening for malignant neoplasm of colon: Secondary | ICD-10-CM | POA: Diagnosis not present

## 2020-04-25 DIAGNOSIS — Z1212 Encounter for screening for malignant neoplasm of rectum: Secondary | ICD-10-CM | POA: Diagnosis not present

## 2020-04-26 ENCOUNTER — Encounter: Payer: Medicare HMO | Admitting: Internal Medicine

## 2020-04-26 NOTE — Patient Instructions (Addendum)
  Blood work was ordered.     Medications reviewed and updated.  Changes include :     Your prescription(s) have been submitted to your pharmacy. Please take as directed and contact our office if you believe you are having problem(s) with the medication(s).  A referral was ordered for        Someone from their office will call you to schedule an appointment.    Please followup in 3 months

## 2020-04-26 NOTE — Progress Notes (Signed)
Subjective:    Patient ID: Mary Acosta, female    DOB: 1956/05/25, 64 y.o.   MRN: [id number]  HPI The patient is here for follow up of their chronic medical problems, including DM, hypothyroidism, hyperlipidemia, anxiety/depression/grief   She is taking all of her medications as prescribed.   She is exercising regularly.    She is following with psychiatry.    Medications and allergies reviewed with patient and updated if appropriate.  Patient Active Problem List   Diagnosis Date Noted  . UTI (urinary tract infection) 03/01/2020  . Grief 02/03/2019  . Nausea 01/04/2019  . Reactive airway disease 10/21/2018  . Arthralgia 09/04/2018  . Narcotic overdose ([place]) 05/14/2018  . Thyroid cancer ([place]) 04/30/2018  . Obesity hypoventilation syndrome ([place]) 09/24/2017  . Palpitations 09/18/2017  . Diabetic neuropathy ([place]) 06/04/2017  . Vitamin B12 deficiency 03/05/2017  . Numbness and tingling in both hands 02/24/2017  . OSA (obstructive sleep apnea) 01/31/2017  . Liver cirrhosis secondary to NASH ([place]) 01/31/2017  . Hypothyroidism 01/31/2017  . Chronic narcotic use 01/31/2017  . Fibromyalgia 01/31/2017  . Hyperlipidemia 01/31/2017  . Gastroparesis   . Memory disorder 07/25/2016  . Acute bronchitis 04/30/2016  . Lump in neck 01/29/2016  . Chronic diastolic (congestive) heart failure ([place]) 01/03/2016  . Fatty liver 01/03/2016  . Type 1 diabetes mellitus ([place]) 01/03/2016  . Recurrent Clostridium difficile diarrhea 01/03/2016  . Chronic respiratory failure ([place]) 11/10/2014  . Physical deconditioning 09/26/2014  . Dyspnea 09/26/2014  . Iron deficiency anemia, unspecified  04/05/2011  . Bariatric surgery status 04/05/2011  . Left arm pain   . UNSPECIFIED VITAMIN D DEFICIENCY 10/22/2007  . LOW BACK PAIN, CHRONIC 10/22/2007  . INSOMNIA 10/22/2007  . Obesity 10/14/2007  . Chronic pain syndrome 10/14/2007  . CARPAL TUNNEL SYNDROME 10/14/2007  . ALLERGIC RHINITIS  CAUSE UNSPECIFIED 10/14/2007  . ARTHRITIS 10/14/2007  . Anxiety state 08/25/2007  . Depression 08/25/2007  . Essential hypertension 08/25/2007  . ASTHMA 08/25/2007  . CONSTIPATION 08/25/2007  . POLYMYALGIA RHEUMATICA 08/25/2007  . LEG EDEMA, BILATERAL 08/25/2007    Current Outpatient Medications on File Prior to Visit  Medication Sig Dispense Refill  . acetaminophen (TYLENOL) 500 MG tablet Take 1,000 mg by mouth 2 (two) times daily.    Marland Kitchen albuterol (VENTOLIN HFA) 108 (90 Base) MCG/ACT inhaler TAKE 2 PUFFS BY MOUTH EVERY 6 HOURS AS NEEDED FOR WHEEZE OR SHORTNESS OF BREATH 18 g 11  . amitriptyline (ELAVIL) 100 MG tablet Take 100 mg by mouth at bedtime.    Marland Kitchen amLODipine (NORVASC) 5 MG tablet Take 1 tablet (5 mg total) by mouth 2 (two) times daily. 180 tablet 1  . aspirin EC 81 MG tablet Take 1 tablet (81 mg total) by mouth daily. 90 tablet 3  . celecoxib (CELEBREX) 100 MG capsule Take 100 mg by mouth 2 (two) times daily.     Marland Kitchen ezetimibe (ZETIA) 10 MG tablet Take 1 tablet (10 mg total) by mouth daily. 30 tablet 1  . furosemide (LASIX) 40 MG tablet Take 1 tablet (40 mg total) by mouth daily. 30 tablet 0  . gabapentin (NEURONTIN) 300 MG capsule Take 300 mg by mouth 3 (three) times daily.     . hydrALAZINE (APRESOLINE) 100 MG tablet Take 1 tablet (100 mg total) by mouth 2 (two) times daily. 180 tablet 1  . hydrOXYzine (ATARAX/VISTARIL) 25 MG tablet Take 1 tablet (25 mg total) by mouth in the morning and at bedtime. 180 tablet 1  .  insulin glargine (LANTUS) 100 UNIT/ML injection Inject 1.8 mLs (180 Units total) into the skin daily. (Patient taking differently: Inject 75 Units into the skin 2 (two) times daily. ) 60 mL 11  . Insulin Pen Needle (PEN NEEDLES) 31G X 8 MM MISC 1 Device by Does not apply route daily. 90 each 3  . Insulin Syringe-Needle U-100 26G X 1/2" 1 ML MISC Use daily for insulin injection as directed 100 each 3  . ipratropium-albuterol (DUONEB) 0.5-2.5 (3) MG/3ML SOLN Take 3 mLs by  nebulization every 6 (six) hours as needed (Wheezing or dyspnea.).    Marland Kitchen levothyroxine (SYNTHROID) 175 MCG tablet TAKE 1 TABLET BY MOUTH SIX DAYS A WEEK BEFORE BREAKFAST 90 tablet 1  . metoprolol tartrate (LOPRESSOR) 25 MG tablet Take 1 tablet (25 mg total) by mouth 2 (two) times daily. 180 tablet 3  . metroNIDAZOLE (FLAGYL) 500 MG tablet Take 1 tablet (500 mg total) by mouth 2 (two) times daily. 20 tablet 0  . mirtazapine (REMERON) 30 MG tablet Take 30 mg by mouth daily.    Marland Kitchen morphine (MS CONTIN) 30 MG 12 hr tablet Take 30 mg by mouth 2 (two) times daily.    . ondansetron (ZOFRAN-ODT) 4 MG disintegrating tablet Take 1 tablet (4 mg total) by mouth every 6 (six) hours as needed for nausea or vomiting. 30 tablet 3  . PARoxetine (PAXIL) 40 MG tablet Take 1 tablet (40 mg total) by mouth daily. 90 tablet 1  . potassium chloride SA (KLOR-CON M20) 20 MEQ tablet TAKE 2 TABLETS EVERY DAY AS NEEDED FOR CRAMPING 180 tablet 1  . tiZANidine (ZANAFLEX) 2 MG tablet Take 2 mg by mouth as needed for muscle spasms.   2   No current facility-administered medications on file prior to visit.    Past Medical History:  Diagnosis Date  . AKI (acute kidney injury) ([place]) 01/2017  . Anxiety    with panic attacks  . Arthritis    "back; feet; hands; shoulders" (08/26/2014)  . Asthma   . Cervical cancer ([place])   . Chronic lower back pain   . Chronic narcotic use   . Chronic pain syndrome    [facility]  . Cirrhosis ([place])   . Clostridium difficile infection 2017  . COPD (chronic obstructive pulmonary disease) ([place])   . Daily headache   . Depression   . Diabetic neuropathy ([place]) 06/04/2017  . DJD (degenerative joint disease)   . Fatty liver disease, nonalcoholic   . Fibromyalgia   . Frequency of urination   . HCAP (healthcare-associated pneumonia) 08/26/2014  . History of TIA (transient ischemic attack) 11-01-2010   NO RESIDUAL  . Hyperlipidemia   . Hypertension   . Hypothyroidism   . IDDM  (insulin dependent diabetes mellitus)   . Insomnia   . Lumbar stenosis   . Memory difficulty 07/25/2016  . Nocturia   . OSA (obstructive sleep apnea)    NO CPAP SINCE [facility] LOSS  . Osteoarthritis    with severe disease in knee  . Pneumonia "several times"  . Polymyalgia rheumatica ([place])   . Scoliosis   . Seasonal allergies   . Thyroid cancer ([place])   . Urgency of urination   . Vaginal pain S/P SLING  FEB 2012    Past Surgical History:  Procedure Laterality Date  . APPENDECTOMY  1982  . BREAST EXCISIONAL BIOPSY Left 02/28/2005   Atypical Ductal Hyperplasia  . CARDIAC CATHETERIZATION  09-04-2004   NORMAL CORONARY ANATOMY/ NORMAL  LVF/ EF 60%  . CARDIOVASCULAR STRESS TEST  12-27-2010  DR [place]   ABNORMAL NUCLEAR STUDY W/ /MILD INFERIOR ISCHEMIA/ EF 69%/  CT HEART ANGIOGRAM ;  NO ACUTE FINDINGS  . CRYOABLATION  05/16/2003   w/LEEP FOR ABNORMAL PAP SMEAR  . CYSTOSCOPY  05/18/2012   Procedure: CYSTOSCOPY;  Surgeon: Reece Packer, MD;  Location[facility];  Service: Urology;  Laterality: N/A;  examination under anethesia  . ESOPHAGOGASTRODUODENOSCOPY (EGD) WITH PROPOFOL N/A 09/03/2016   Procedure: ESOPHAGOGASTRODUODENOSCOPY (EGD) WITH PROPOFOL;  Surgeon: Doran Stabler, MD;  Location: [facility];  Service: Gastroenterology;  Laterality: N/A;  . HYSTEROSCOPY WITH D & C  08-19-2007   PMB  . KNEE ARTHROSCOPY W/ DEBRIDEMENT Left 03/29/2006   INTERNAL DERANGEMENT/ SEVERE DJD/ MENISCUS TEARS  . LAPAROSCOPIC CHOLECYSTECTOMY  06-10-2002  . LAPAROSCOPIC GASTRIC BANDING  03/01/2006   TRUNCAL VAGOTOMY/ PLACEMENT OF VG BAND  . REVISION TOTAL KNEE ARTHROPLASTY Left 08-29-2008; 05/2009  . TONSILLECTOMY  1969  . TOTAL KNEE ARTHROPLASTY Left 01-23-2007   SEVERE DJD  . TOTAL THYROIDECTOMY  11-22-2005   BILATERAL THYROID NODULES-- PAPILLARY CARCINOMA (0.5CM)/ ADENOMATOID NODULES  . TRANSTHORACIC ECHOCARDIOGRAM  12-27-2010   LVSF NORMAL / EF 22-48%/ GRADE I DIASTOLIC  DYSFUNCTION/ MILD MITRAL REGURG. / MILDLY DILATED LEFT ATRIUM/ MILDY INCREASED SYSTOLIC PRESSURE OF PULMONARY ARTERIES  . TRANSVAGINAL SUBURETERAL TAPE/ SLING  09-28-2010   MIXED URINARY INCONTINENCE  . TUBAL LIGATION  1983    Social History   Socioeconomic History  . Marital status: Married    Spouse name: Not on file  . Number of children: 2  . Years of education: 51  . Highest education level: Not on file  Occupational History  . Occupation: disabled    Fish farm manager: UNEMPLOYED  Tobacco Use  . Smoking status: Former Smoker    Packs/day: 2.50    Years: 39.00    Pack years: 97.50    Types: Cigarettes    Quit date: 10/22/2010    Years since quitting: 9.5  . Smokeless tobacco: Never Used  Vaping Use  . Vaping Use: Never used  Substance and Sexual Activity  . Alcohol use: No    Alcohol/week: 0.0 standard drinks  . Drug use: No  . Sexual activity: Not Currently  Other Topics Concern  . Not on file  Social History Narrative   Lives at home w/ her husband    Right-handed   Caffeine: 1 cup of coffee per week + [employer]   Social Determinants of Health   Financial Resource Strain: High Risk  . Difficulty of Paying Living Expenses: Hard  Food Insecurity:   . Worried About Charity fundraiser in the Last Year: Not on file  . Ran Out of Food in the Last Year: Not on file  Transportation Needs:   . Lack of Transportation (Medical): Not on file  . Lack of Transportation (Non-Medical): Not on file  Physical Activity:   . Days of Exercise per Week: Not on file  . Minutes of Exercise per Session: Not on file  Stress:   . Feeling of Stress : Not on file  Social Connections:   . Frequency of Communication with Friends and Family: Not on file  . Frequency of Social Gatherings with Friends and Family: Not on file  . Attends Religious Services: Not on file  . Active Member of Clubs or Organizations: Not on file  . Attends Archivist Meetings: Not on file  . Marital Status:  Not on file    Family History  Problem Relation Age of Onset  . Diabetes Mother   . Heart disease Mother   . Dementia Mother   . Heart disease Father   . High blood pressure Father   . Colon cancer Maternal Uncle        x 2  . Breast cancer Other        great aunts x 5    Review of Systems     Objective:  There were no vitals filed for this visit. BP Readings from Last 3 Encounters:  04/13/20 (!) 142/84  03/01/20 124/70  03/01/20 122/64   Wt Readings from Last 3 Encounters:  04/13/20 (!) 301 lb 2 oz (136.6 kg)  03/18/20 (!) 290 lb (131.5 kg)  03/01/20 290 lb (131.5 kg)   There is no height or weight on file to calculate BMI.   Physical Exam    Constitutional: Appears well-developed and well-nourished. No distress.  HENT:  Head: Normocephalic and atraumatic.  Neck: Neck supple. No tracheal deviation present. No thyromegaly present.  No cervical lymphadenopathy Cardiovascular: Normal rate, regular rhythm and normal heart sounds.   No murmur heard. No carotid bruit .  No edema Pulmonary/Chest: Effort normal and breath sounds normal. No respiratory distress. No has no wheezes. No rales.  Skin: Skin is warm and dry. Not diaphoretic.  Psychiatric: Normal mood and affect. Behavior is normal.      Assessment & Plan:    See Problem List for Assessment and Plan of chronic medical problems.    This visit occurred during the SARS-CoV-2 public health emergency.  Safety protocols were in place, including screening questions prior to the visit, additional usage of staff PPE, and extensive cleaning of exam room while observing appropriate contact time as indicated for disinfecting solutions.    This encounter was created in error - please disregard.

## 2020-04-26 NOTE — Assessment & Plan Note (Signed)
Chronic Not on statin due to liver cirrhosis Lipids, cmp Stressed weight loss, regular exercise, low fat/cholesterol diet

## 2020-05-02 NOTE — Patient Instructions (Addendum)
  Blood work was ordered.     Medications reviewed and updated.  Changes include :   none    A referral was ordered for Endocrine - Dr Cruzita Lederer.       Someone from their office will call you to schedule an appointment.    Please followup in 6 months

## 2020-05-02 NOTE — Progress Notes (Signed)
Subjective:    Patient ID: Mary Acosta, female    DOB: November 24, 1955, 64 y.o.   MRN: [id number]  HPI The patient is here for follow up of their chronic medical problems, including DM, hypothyroidism, hyperlipidemia, anxiety/depression/grief   She is taking all of her medications as prescribed.   She is not exercising regularly.    She is following with psychiatry.  She feels her weight gain is from the remeron.  She wants to come off of the medication because of the weight gain which is causing other problems, but she is not able to sleep without the medication.  When she tried stopping in the past all she did was crying.  It is helping with her depression.   Her blood sugar was 407 just before coming here.   Taking 80 units of insulin in the morning and 80 units in the evening.  She did take extra insulin to help bring her sugars down.  She was following with endocrine and I have been asking her to return to endocrine because of the difficulty controlling her sugars.  She is still grieving her grandson's death and trying to find closure with it.  Medications and allergies reviewed with patient and updated if appropriate.  Patient Active Problem List   Diagnosis Date Noted  . UTI (urinary tract infection) 03/01/2020  . Grief 02/03/2019  . Nausea 01/04/2019  . Reactive airway disease 10/21/2018  . Arthralgia 09/04/2018  . Narcotic overdose ([place]) 05/14/2018  . Thyroid cancer ([place]) 04/30/2018  . Obesity hypoventilation syndrome ([place]) 09/24/2017  . Palpitations 09/18/2017  . Diabetic neuropathy ([place]) 06/04/2017  . Vitamin B12 deficiency 03/05/2017  . Numbness and tingling in both hands 02/24/2017  . OSA (obstructive sleep apnea) 01/31/2017  . Liver cirrhosis secondary to NASH ([place]) 01/31/2017  . Hypothyroidism 01/31/2017  . Chronic narcotic use 01/31/2017  . Fibromyalgia 01/31/2017  . Hyperlipidemia 01/31/2017  . Gastroparesis   . Memory disorder 07/25/2016  . Lump  in neck 01/29/2016  . Chronic diastolic (congestive) heart failure ([place]) 01/03/2016  . Fatty liver 01/03/2016  . Type 1 diabetes mellitus ([place]) 01/03/2016  . Recurrent Clostridium difficile diarrhea 01/03/2016  . Chronic respiratory failure ([place]) 11/10/2014  . Physical deconditioning 09/26/2014  . Dyspnea 09/26/2014  . Iron deficiency anemia, unspecified  04/05/2011  . Bariatric surgery status 04/05/2011  . Left arm pain   . UNSPECIFIED VITAMIN D DEFICIENCY 10/22/2007  . LOW BACK PAIN, CHRONIC 10/22/2007  . INSOMNIA 10/22/2007  . Obesity 10/14/2007  . Chronic pain syndrome 10/14/2007  . CARPAL TUNNEL SYNDROME 10/14/2007  . ALLERGIC RHINITIS CAUSE UNSPECIFIED 10/14/2007  . ARTHRITIS 10/14/2007  . Anxiety state 08/25/2007  . Depression 08/25/2007  . Essential hypertension 08/25/2007  . ASTHMA 08/25/2007  . CONSTIPATION 08/25/2007  . POLYMYALGIA RHEUMATICA 08/25/2007  . LEG EDEMA, BILATERAL 08/25/2007    Current Outpatient Medications on File Prior to Visit  Medication Sig Dispense Refill  . acetaminophen (TYLENOL) 500 MG tablet Take 1,000 mg by mouth 2 (two) times daily.    Marland Kitchen albuterol (VENTOLIN HFA) 108 (90 Base) MCG/ACT inhaler TAKE 2 PUFFS BY MOUTH EVERY 6 HOURS AS NEEDED FOR WHEEZE OR SHORTNESS OF BREATH 18 g 11  . amitriptyline (ELAVIL) 100 MG tablet Take 100 mg by mouth at bedtime.    Marland Kitchen amLODipine (NORVASC) 5 MG tablet Take 1 tablet (5 mg total) by mouth 2 (two) times daily. 180 tablet 1  . aspirin EC 81 MG tablet Take 1 tablet (81  mg total) by mouth daily. 90 tablet 3  . celecoxib (CELEBREX) 100 MG capsule Take 100 mg by mouth 2 (two) times daily.     Marland Kitchen ezetimibe (ZETIA) 10 MG tablet Take 1 tablet (10 mg total) by mouth daily. 30 tablet 1  . furosemide (LASIX) 40 MG tablet Take 1 tablet (40 mg total) by mouth daily. 30 tablet 0  . gabapentin (NEURONTIN) 300 MG capsule Take 300 mg by mouth 3 (three) times daily.     . hydrALAZINE (APRESOLINE) 100 MG tablet Take 1 tablet  (100 mg total) by mouth 2 (two) times daily. 180 tablet 1  . hydrOXYzine (ATARAX/VISTARIL) 25 MG tablet Take 1 tablet (25 mg total) by mouth in the morning and at bedtime. 180 tablet 1  . insulin glargine (LANTUS) 100 UNIT/ML injection Inject 1.8 mLs (180 Units total) into the skin daily. (Patient taking differently: Inject 75 Units into the skin 2 (two) times daily. ) 60 mL 11  . Insulin Pen Needle (PEN NEEDLES) 31G X 8 MM MISC 1 Device by Does not apply route daily. 90 each 3  . Insulin Syringe-Needle U-100 26G X 1/2" 1 ML MISC Use daily for insulin injection as directed 100 each 3  . ipratropium-albuterol (DUONEB) 0.5-2.5 (3) MG/3ML SOLN Take 3 mLs by nebulization every 6 (six) hours as needed (Wheezing or dyspnea.).    Marland Kitchen levothyroxine (SYNTHROID) 175 MCG tablet TAKE 1 TABLET BY MOUTH SIX DAYS A WEEK BEFORE BREAKFAST 90 tablet 1  . metoprolol tartrate (LOPRESSOR) 25 MG tablet Take 1 tablet (25 mg total) by mouth 2 (two) times daily. 180 tablet 3  . mirtazapine (REMERON) 30 MG tablet Take 30 mg by mouth daily.    Marland Kitchen morphine (MS CONTIN) 30 MG 12 hr tablet Take 30 mg by mouth 2 (two) times daily.    . ondansetron (ZOFRAN-ODT) 4 MG disintegrating tablet Take 1 tablet (4 mg total) by mouth every 6 (six) hours as needed for nausea or vomiting. 30 tablet 3  . PARoxetine (PAXIL) 40 MG tablet Take 1 tablet (40 mg total) by mouth daily. 90 tablet 1  . potassium chloride SA (KLOR-CON M20) 20 MEQ tablet TAKE 2 TABLETS EVERY DAY AS NEEDED FOR CRAMPING 180 tablet 1  . tiZANidine (ZANAFLEX) 2 MG tablet Take 2 mg by mouth as needed for muscle spasms.   2   No current facility-administered medications on file prior to visit.    Past Medical History:  Diagnosis Date  . AKI (acute kidney injury) ([place]) 01/2017  . Anxiety    with panic attacks  . Arthritis    "back; feet; hands; shoulders" (08/26/2014)  . Asthma   . Cervical cancer ([place])   . Chronic lower back pain   . Chronic narcotic use   . Chronic pain  syndrome    [facility]  . Cirrhosis ([place])   . Clostridium difficile infection 2017  . COPD (chronic obstructive pulmonary disease) ([place])   . Daily headache   . Depression   . Diabetic neuropathy ([place]) 06/04/2017  . DJD (degenerative joint disease)   . Fatty liver disease, nonalcoholic   . Fibromyalgia   . Frequency of urination   . HCAP (healthcare-associated pneumonia) 08/26/2014  . History of TIA (transient ischemic attack) 11-01-2010   NO RESIDUAL  . Hyperlipidemia   . Hypertension   . Hypothyroidism   . IDDM (insulin dependent diabetes mellitus)   . Insomnia   . Lumbar stenosis   . Memory difficulty  07/25/2016  . Nocturia   . OSA (obstructive sleep apnea)    NO CPAP SINCE WT LOSS  . Osteoarthritis    with severe disease in knee  . Pneumonia "several times"  . Polymyalgia rheumatica ([place])   . Scoliosis   . Seasonal allergies   . Thyroid cancer ([place])   . Urgency of urination   . Vaginal pain S/P SLING  FEB 2012    Past Surgical History:  Procedure Laterality Date  . APPENDECTOMY  1982  . BREAST EXCISIONAL BIOPSY Left 02/28/2005   Atypical Ductal Hyperplasia  . CARDIAC CATHETERIZATION  09-04-2004   NORMAL CORONARY ANATOMY/ NORMAL LVF/ EF 60%  . CARDIOVASCULAR STRESS TEST  12-27-2010  DR [place]   ABNORMAL NUCLEAR STUDY W/ /MILD INFERIOR ISCHEMIA/ EF 69%/  CT HEART ANGIOGRAM ;  NO ACUTE FINDINGS  . CRYOABLATION  05/16/2003   w/LEEP FOR ABNORMAL PAP SMEAR  . CYSTOSCOPY  05/18/2012   Procedure: CYSTOSCOPY;  Surgeon: Reece Packer, MD;  Location: [facility];  Service: Urology;  Laterality: N/A;  examination under anethesia  . ESOPHAGOGASTRODUODENOSCOPY (EGD) WITH PROPOFOL N/A 09/03/2016   Procedure: ESOPHAGOGASTRODUODENOSCOPY (EGD) WITH PROPOFOL;  Surgeon: Doran Stabler, MD;  Location: [facility];  Service: Gastroenterology;  Laterality: N/A;  . HYSTEROSCOPY WITH D & C  08-19-2007   PMB  . KNEE ARTHROSCOPY W/ DEBRIDEMENT Left  03/29/2006   INTERNAL DERANGEMENT/ SEVERE DJD/ MENISCUS TEARS  . LAPAROSCOPIC CHOLECYSTECTOMY  06-10-2002  . LAPAROSCOPIC GASTRIC BANDING  03/01/2006   TRUNCAL VAGOTOMY/ PLACEMENT OF VG BAND  . REVISION TOTAL KNEE ARTHROPLASTY Left 08-29-2008; 05/2009  . TONSILLECTOMY  1969  . TOTAL KNEE ARTHROPLASTY Left 01-23-2007   SEVERE DJD  . TOTAL THYROIDECTOMY  11-22-2005   BILATERAL THYROID NODULES-- PAPILLARY CARCINOMA (0.5CM)/ ADENOMATOID NODULES  . TRANSTHORACIC ECHOCARDIOGRAM  12-27-2010   LVSF NORMAL / EF 33-82%/ GRADE I DIASTOLIC DYSFUNCTION/ MILD MITRAL REGURG. / MILDLY DILATED LEFT ATRIUM/ MILDY INCREASED SYSTOLIC PRESSURE OF PULMONARY ARTERIES  . TRANSVAGINAL SUBURETERAL TAPE/ SLING  09-28-2010   MIXED URINARY INCONTINENCE  . TUBAL LIGATION  1983    Social History   Socioeconomic History  . Marital status: Married    Spouse name: Not on file  . Number of children: 2  . Years of education: 80  . Highest education level: Not on file  Occupational History  . Occupation: disabled    Fish farm manager: UNEMPLOYED  Tobacco Use  . Smoking status: Former Smoker    Packs/day: 2.50    Years: 39.00    Pack years: 97.50    Types: Cigarettes    Quit date: 10/22/2010    Years since quitting: 9.5  . Smokeless tobacco: Never Used  Vaping Use  . Vaping Use: Never used  Substance and Sexual Activity  . Alcohol use: No    Alcohol/week: 0.0 standard drinks  . Drug use: No  . Sexual activity: Not Currently  Other Topics Concern  . Not on file  Social History Narrative   Lives at home w/ her husband    Right-handed   Caffeine: 1 cup of coffee per week + [employer]   Social Determinants of Health   Financial Resource Strain: High Risk  . Difficulty of Paying Living Expenses: Hard  Food Insecurity:   . Worried About Charity fundraiser in the Last Year: Not on file  . Ran Out of Food in the Last Year: Not on file  Transportation Needs:   . Lack of Transportation (Medical): Not  on file  . Lack of  Transportation (Non-Medical): Not on file  Physical Activity:   . Days of Exercise per Week: Not on file  . Minutes of Exercise per Session: Not on file  Stress:   . Feeling of Stress : Not on file  Social Connections:   . Frequency of Communication with Friends and Family: Not on file  . Frequency of Social Gatherings with Friends and Family: Not on file  . Attends Religious Services: Not on file  . Active Member of Clubs or Organizations: Not on file  . Attends Archivist Meetings: Not on file  . Marital Status: Not on file    Family History  Problem Relation Age of Onset  . Diabetes Mother   . Heart disease Mother   . Dementia Mother   . Heart disease Father   . High blood pressure Father   . Colon cancer Maternal Uncle        x 2  . Breast cancer Other        great aunts x 5    Review of Systems  Constitutional: Positive for appetite change (increased). Negative for fever.  Respiratory: Positive for cough (allergy related) and shortness of breath. Negative for wheezing.   Cardiovascular: Positive for leg swelling. Negative for chest pain and palpitations.  Neurological: Positive for dizziness. Negative for light-headedness and headaches.  Psychiatric/Behavioral: Positive for dysphoric mood. Negative for sleep disturbance. The patient is nervous/anxious.        Objective:   Vitals:   05/03/20 1423  BP: (!) 146/84  Pulse: 90  Temp: 98.3 F (36.8 C)  SpO2: 90%   BP Readings from Last 3 Encounters:  05/03/20 (!) 146/84  04/13/20 (!) 142/84  03/01/20 124/70   Wt Readings from Last 3 Encounters:  05/03/20 (!) 315 lb 3.2 oz (143 kg)  04/13/20 (!) 301 lb 2 oz (136.6 kg)  03/18/20 (!) 290 lb (131.5 kg)   Body mass index is 46.55 kg/m.   Physical Exam    Constitutional: Appears well-developed and well-nourished. No distress.  HENT:  Head: Normocephalic and atraumatic.  Neck: Neck supple. No tracheal deviation present. No thyromegaly present.  No  cervical lymphadenopathy Cardiovascular: Normal rate, regular rhythm and normal heart sounds.   No murmur heard. No carotid bruit .  Trace bilateral lower extremity edema Pulmonary/Chest: Effort normal and breath sounds normal. No respiratory distress. No has no wheezes. No rales.  Skin: Skin is warm and dry. Not diaphoretic.  Psychiatric: Normal mood and affect. Behavior is normal.      Assessment & Plan:    See Problem List for Assessment and Plan of chronic medical problems.    This visit occurred during the SARS-CoV-2 public health emergency.  Safety protocols were in place, including screening questions prior to the visit, additional usage of staff PPE, and extensive cleaning of exam room while observing appropriate contact time as indicated for disinfecting solutions.

## 2020-05-03 ENCOUNTER — Ambulatory Visit (INDEPENDENT_AMBULATORY_CARE_PROVIDER_SITE_OTHER): Payer: Medicare HMO | Admitting: Internal Medicine

## 2020-05-03 ENCOUNTER — Encounter: Payer: Self-pay | Admitting: Internal Medicine

## 2020-05-03 ENCOUNTER — Other Ambulatory Visit: Payer: Self-pay

## 2020-05-03 VITALS — BP 146/84 | HR 90 | Temp 98.3°F | Wt 315.2 lb

## 2020-05-03 DIAGNOSIS — Z23 Encounter for immunization: Secondary | ICD-10-CM | POA: Diagnosis not present

## 2020-05-03 DIAGNOSIS — E1042 Type 1 diabetes mellitus with diabetic polyneuropathy: Secondary | ICD-10-CM

## 2020-05-03 DIAGNOSIS — I10 Essential (primary) hypertension: Secondary | ICD-10-CM

## 2020-05-03 DIAGNOSIS — E782 Mixed hyperlipidemia: Secondary | ICD-10-CM | POA: Diagnosis not present

## 2020-05-03 DIAGNOSIS — E890 Postprocedural hypothyroidism: Secondary | ICD-10-CM | POA: Diagnosis not present

## 2020-05-03 NOTE — Assessment & Plan Note (Signed)
Chronic Poorly controlled  With neuropathy, gastroparesis and retinopathy Stressed to her that she needs to see an endocrinologist to help get better control over her sugars and she does agree She would prefer not to see Dr. Loanne Drilling again-Will refer to low Exie Parody endocrine Stressed improving her diet and working on weight loss There has been some confusion whether she was a type I or type II diabetic-we will check C-peptide Check A1c Continue current insulin dosing

## 2020-05-03 NOTE — Assessment & Plan Note (Addendum)
Chronic History of thyroid cancer status post thyroidectomy Clinically euthyroid Check tsh  Titrate med dose if needed Will refer to endocrine

## 2020-05-03 NOTE — Assessment & Plan Note (Signed)
Chronic Not currently on statin due to liver cirrhosis On Zetia-continue Check lipid panel

## 2020-05-03 NOTE — Assessment & Plan Note (Addendum)
Chronic Blood pressure slightly elevated here today, but relatively controlled for her Continue current medications Stressed decreasing sodium intake Discussed the importance of not skipping any to her cravings of food and overeating-I understand the Remeron is causing some of this and she will discuss this with her psychiatrist CMP, CBC

## 2020-05-04 ENCOUNTER — Telehealth: Payer: Medicare HMO

## 2020-05-04 ENCOUNTER — Other Ambulatory Visit: Payer: Self-pay | Admitting: Internal Medicine

## 2020-05-04 LAB — COMPLETE METABOLIC PANEL WITH GFR
AG Ratio: 1.3 (calc) (ref 1.0–2.5)
ALT: 16 U/L (ref 6–29)
AST: 22 U/L (ref 10–35)
Albumin: 4 g/dL (ref 3.6–5.1)
Alkaline phosphatase (APISO): 97 U/L (ref 37–153)
BUN: 14 mg/dL (ref 7–25)
CO2: 34 mmol/L — ABNORMAL HIGH (ref 20–32)
Calcium: 8.9 mg/dL (ref 8.6–10.4)
Chloride: 96 mmol/L — ABNORMAL LOW (ref 98–110)
Creat: 0.97 mg/dL (ref 0.50–0.99)
GFR, Est African American: 72 mL/min/{1.73_m2} (ref >=60)
GFR, Est Non African American: 62 mL/min/{1.73_m2} (ref >=60)
Globulin: 3.1 g/dL (calc) (ref 1.9–3.7)
Glucose, Bld: 268 mg/dL — ABNORMAL HIGH (ref 65–99)
Potassium: 4.8 mmol/L (ref 3.5–5.3)
Sodium: 138 mmol/L (ref 135–146)
Total Bilirubin: 0.5 mg/dL (ref 0.2–1.2)
Total Protein: 7.1 g/dL (ref 6.1–8.1)

## 2020-05-04 LAB — LIPID PANEL
Cholesterol: 126 mg/dL (ref <200)
HDL: 58 mg/dL (ref >=50)
LDL Cholesterol (Calc): 55 mg/dL (calc)
Non-HDL Cholesterol (Calc): 68 mg/dL (calc) (ref <130)
Total CHOL/HDL Ratio: 2.2 (calc) (ref <5.0)
Triglycerides: 54 mg/dL (ref <150)

## 2020-05-04 LAB — TSH: TSH: 4.42 mIU/L (ref 0.40–4.50)

## 2020-05-04 LAB — CBC WITH DIFFERENTIAL/PLATELET
Absolute Monocytes: 596 cells/uL (ref 200–950)
Basophils Absolute: 53 cells/uL (ref 0–200)
Basophils Relative: 0.6 %
Eosinophils Absolute: 125 cells/uL (ref 15–500)
Eosinophils Relative: 1.4 %
HCT: 39.9 % (ref 35.0–45.0)
Hemoglobin: 12.2 g/dL (ref 11.7–15.5)
Lymphs Abs: 1851 cells/uL (ref 850–3900)
MCH: 25.1 pg — ABNORMAL LOW (ref 27.0–33.0)
MCHC: 30.6 g/dL — ABNORMAL LOW (ref 32.0–36.0)
MCV: 82.1 fL (ref 80.0–100.0)
MPV: 11.2 fL (ref 7.5–12.5)
Monocytes Relative: 6.7 %
Neutro Abs: 6275 cells/uL (ref 1500–7800)
Neutrophils Relative %: 70.5 %
Platelets: 174 10*3/uL (ref 140–400)
RBC: 4.86 10*6/uL (ref 3.80–5.10)
RDW: 17.1 % — ABNORMAL HIGH (ref 11.0–15.0)
Total Lymphocyte: 20.8 %
WBC: 8.9 10*3/uL (ref 3.8–10.8)

## 2020-05-04 LAB — C-PEPTIDE: C-Peptide: 1.52 ng/mL (ref 0.80–3.85)

## 2020-05-04 LAB — HEMOGLOBIN A1C
Hgb A1c MFr Bld: 10.4 % of total Hgb — ABNORMAL HIGH (ref <5.7)
Mean Plasma Glucose: 252 (calc)
eAG (mmol/L): 13.9 (calc)

## 2020-05-04 MED ORDER — RYBELSUS 3 MG PO TABS: 3.0000 mg | ORAL_TABLET | Freq: Every day | ORAL | 0 refills | Status: DC

## 2020-05-04 MED ORDER — LEVOTHYROXINE SODIUM 175 MCG PO TABS
175.0000 ug | ORAL_TABLET | Freq: Every day | ORAL | 1 refills | Status: DC
Start: 2020-05-04 — End: 2020-05-05

## 2020-05-05 ENCOUNTER — Other Ambulatory Visit: Payer: Self-pay | Admitting: Internal Medicine

## 2020-05-05 LAB — COLOGUARD: COLOGUARD: NEGATIVE

## 2020-05-05 MED ORDER — LEVOTHYROXINE SODIUM 200 MCG PO TABS: 200.0000 ug | ORAL_TABLET | Freq: Every day | ORAL | 1 refills | Status: DC

## 2020-05-08 ENCOUNTER — Other Ambulatory Visit: Payer: Self-pay

## 2020-05-08 LAB — COLOGUARD: Cologuard: NEGATIVE

## 2020-05-09 ENCOUNTER — Telehealth: Payer: Self-pay | Admitting: Pharmacist

## 2020-05-09 NOTE — Progress Notes (Signed)
Chronic Care Management Pharmacy Assistant   Name: Mary Acosta  MRN: [id number] DOB: September 21, 1955  Reason for Encounter: Medication Review  Patient Questions:  1.  Have you seen any other providers since your last visit? Yes, The patient saw her PCP Dr.Burns  2.  Any changes in your medicines or health? Yes, The patient was taken off of  levothyroxine 18mcg and was switched to 234mcg.    PCP : Binnie Rail, MD  Allergies:   Allergies  Allergen Reactions  . Gabapentin Swelling    Swelling in legs Swelling in legs Swelling in legs  . Losartan Other (See Comments)    Myalgias and muscle cramping Other reaction(s): Other (See Comments) Myalgias and muscle cramping Myalgias and muscle cramping Other reaction(s): Other (See Comments) Myalgias and muscle cramping  . Aricept [Donepezil Hcl]     Nausea/vomiting, low BP  . Oxycodone Itching  . Sulfa Antibiotics Nausea Only and Rash  . Sulfonamide Derivatives Nausea Only    Medications: Outpatient Encounter Medications as of 05/09/2020  Medication Sig Note  . acetaminophen (TYLENOL) 500 MG tablet Take 1,000 mg by mouth 2 (two) times daily.   Marland Kitchen albuterol (VENTOLIN HFA) 108 (90 Base) MCG/ACT inhaler TAKE 2 PUFFS BY MOUTH EVERY 6 HOURS AS NEEDED FOR WHEEZE OR SHORTNESS OF BREATH   . amitriptyline (ELAVIL) 100 MG tablet Take 100 mg by mouth at bedtime.   Marland Kitchen amLODipine (NORVASC) 5 MG tablet Take 1 tablet (5 mg total) by mouth 2 (two) times daily.   Marland Kitchen aspirin EC 81 MG tablet Take 1 tablet (81 mg total) by mouth daily.   . celecoxib (CELEBREX) 100 MG capsule Take 100 mg by mouth 2 (two) times daily.    Marland Kitchen ezetimibe (ZETIA) 10 MG tablet Take 1 tablet (10 mg total) by mouth daily.   . furosemide (LASIX) 40 MG tablet Take 1 tablet (40 mg total) by mouth daily.   Marland Kitchen gabapentin (NEURONTIN) 300 MG capsule Take 300 mg by mouth 3 (three) times daily.    . hydrALAZINE (APRESOLINE) 100 MG tablet Take 1 tablet (100 mg total) by mouth 2  (two) times daily.   . hydrOXYzine (ATARAX/VISTARIL) 25 MG tablet Take 1 tablet (25 mg total) by mouth in the morning and at bedtime.   . insulin glargine (LANTUS) 100 UNIT/ML injection Inject 1.8 mLs (180 Units total) into the skin daily. (Patient taking differently: Inject 75 Units into the skin 2 (two) times daily. ) 02/14/2020: Basaglar via [place] PAP  . Insulin Pen Needle (PEN NEEDLES) 31G X 8 MM MISC 1 Device by Does not apply route daily.   . Insulin Syringe-Needle U-100 26G X 1/2" 1 ML MISC Use daily for insulin injection as directed   . ipratropium-albuterol (DUONEB) 0.5-2.5 (3) MG/3ML SOLN Take 3 mLs by nebulization every 6 (six) hours as needed (Wheezing or dyspnea.).   Marland Kitchen levothyroxine (SYNTHROID) 200 MCG tablet Take 1 tablet (200 mcg total) by mouth daily before breakfast.   . metoprolol tartrate (LOPRESSOR) 25 MG tablet Take 1 tablet (25 mg total) by mouth 2 (two) times daily.   . mirtazapine (REMERON) 30 MG tablet Take 30 mg by mouth daily.   Marland Kitchen morphine (MS CONTIN) 30 MG 12 hr tablet Take 30 mg by mouth 2 (two) times daily.   . ondansetron (ZOFRAN-ODT) 4 MG disintegrating tablet Take 1 tablet (4 mg total) by mouth every 6 (six) hours as needed for nausea or vomiting.   Marland Kitchen PARoxetine (PAXIL)  40 MG tablet Take 1 tablet (40 mg total) by mouth daily.   . potassium chloride SA (KLOR-CON M20) 20 MEQ tablet TAKE 2 TABLETS EVERY DAY AS NEEDED FOR CRAMPING   . Semaglutide (RYBELSUS) 3 MG TABS Take 3 mg by mouth daily.   Marland Kitchen tiZANidine (ZANAFLEX) 2 MG tablet Take 2 mg by mouth as needed for muscle spasms.     No facility-administered encounter medications on file as of 05/09/2020.    Current Diagnosis: Patient Active Problem List   Diagnosis Date Noted  . UTI (urinary tract infection) 03/01/2020  . Grief 02/03/2019  . Nausea 01/04/2019  . Reactive airway disease 10/21/2018  . Arthralgia 09/04/2018  . Narcotic overdose ([place]) 05/14/2018  . Thyroid cancer ([place]) 04/30/2018  . Obesity  hypoventilation syndrome ([place]) 09/24/2017  . Palpitations 09/18/2017  . Diabetic neuropathy ([place]) 06/04/2017  . Vitamin B12 deficiency 03/05/2017  . Numbness and tingling in both hands 02/24/2017  . OSA (obstructive sleep apnea) 01/31/2017  . Liver cirrhosis secondary to NASH ([place]) 01/31/2017  . Hypothyroidism 01/31/2017  . Chronic narcotic use 01/31/2017  . Fibromyalgia 01/31/2017  . Hyperlipidemia 01/31/2017  . Gastroparesis   . Memory disorder 07/25/2016  . Lump in neck 01/29/2016  . Chronic diastolic (congestive) heart failure ([place]) 01/03/2016  . Fatty liver 01/03/2016  . Type 1 diabetes mellitus ([place]) 01/03/2016  . Recurrent Clostridium difficile diarrhea 01/03/2016  . Chronic respiratory failure ([place]) 11/10/2014  . Physical deconditioning 09/26/2014  . Dyspnea 09/26/2014  . Iron deficiency anemia, unspecified  04/05/2011  . Bariatric surgery status 04/05/2011  . Left arm pain   . UNSPECIFIED VITAMIN D DEFICIENCY 10/22/2007  . LOW BACK PAIN, CHRONIC 10/22/2007  . INSOMNIA 10/22/2007  . Obesity 10/14/2007  . Chronic pain syndrome 10/14/2007  . CARPAL TUNNEL SYNDROME 10/14/2007  . ALLERGIC RHINITIS CAUSE UNSPECIFIED 10/14/2007  . ARTHRITIS 10/14/2007  . Anxiety state 08/25/2007  . Depression 08/25/2007  . Essential hypertension 08/25/2007  . ASTHMA 08/25/2007  . CONSTIPATION 08/25/2007  . POLYMYALGIA RHEUMATICA 08/25/2007  . LEG EDEMA, BILATERAL 08/25/2007    Goals Addressed   None     Follow-Up:  Coordination of Enhanced Pharmacy Services     Reviewed chart for medication changes ahead of medication coordination call.  The patient saw Dr. Quay Burow on 05/03/2020. The patient was taken off of Levothyroxine 147mcg, she was switched to Levothyroxine 249mcg.   BP Readings from Last 3 Encounters:  05/03/20 (!) 146/84  04/13/20 (!) 142/84  03/01/20 124/70    Lab Results  Component Value Date   HGBA1C 10.4 (H) 05/03/2020     Patient obtains medications through  Adherence Packaging  30 Days   Last adherence delivery included:   Zetia 10mg  daily-Breakfast  Simvastatin 20mg  daily- Bedtime Metoprolol Tartrate 25mg  twice daily- Breakfast, Bedtime  Amlodipine 5mg  twice daily- Breakfast, Bedtime  Hydralazine 100mg  twice daily-Breakfast, Bedtime  Hydroxyzine Hcl 25mg  twice daily-Breakfast, Bedtime  Paroxetine 40mg  daily-Bedtime Amitriptyline 100mg  daily- Bedtime   Patient is due for next adherence delivery on: 05/15/2020. Called patient and reviewed medications and coordinated delivery.  This delivery to include:  Ondansetron 4mg  Dissolve as needed- Vial  Furosemide 40mg  daily- Vial  Zetia 10mg  daily-Breakfast  Simvastatin 20mg  daily- Bedtime Metoprolol Tartrate 25mg  twice daily- Breakfast, Bedtime  Amlodipine 5mg  twice daily- Breakfast, Bedtime  Hydralazine 100mg  twice daily-Breakfast, Bedtime  Hydroxyzine Hcl 25mg  twice daily-Breakfast, Bedtime  Paroxetine 40mg  daily-Bedtime Amitriptyline 100mg  daily- Bedtime   Patient declined the following medications (Leothyroxine 150mcg) due to the medication  being sent to [facility] pharmacy for new RX order written for 258mcg.   Confirmed delivery date of 05/15/2020, advised patient that pharmacy will contact them the morning of delivery.   Rosendo Gros, [place] Pharmacist Assistant  [phone]

## 2020-05-09 NOTE — Progress Notes (Signed)
Patient has [employer] and reports copay for Rybelsus is cost prohibitive at this time.  Reviewed application process for [employer] patient assistance program. Patient meets income/out of pocket spend criteria for the program. Patient will provide proof of income, out of pocket spend report, and will sign application. Will collaborate with prescriber Dr Quay Burow for the provider portion of application. Once completed, application will be submitted via Fax   Patient assistance program Fax number: [phone]  Charlton Haws, [facility]

## 2020-05-10 ENCOUNTER — Telehealth: Payer: Medicare HMO

## 2020-05-11 DIAGNOSIS — H43813 Vitreous degeneration, bilateral: Secondary | ICD-10-CM | POA: Diagnosis not present

## 2020-05-11 DIAGNOSIS — H35033 Hypertensive retinopathy, bilateral: Secondary | ICD-10-CM | POA: Diagnosis not present

## 2020-05-11 DIAGNOSIS — H353231 Exudative age-related macular degeneration, bilateral, with active choroidal neovascularization: Secondary | ICD-10-CM | POA: Diagnosis not present

## 2020-05-11 DIAGNOSIS — J449 Chronic obstructive pulmonary disease, unspecified: Secondary | ICD-10-CM | POA: Diagnosis not present

## 2020-05-11 DIAGNOSIS — H35423 Microcystoid degeneration of retina, bilateral: Secondary | ICD-10-CM | POA: Diagnosis not present

## 2020-05-12 ENCOUNTER — Telehealth: Payer: Self-pay | Admitting: Gastroenterology

## 2020-05-12 DIAGNOSIS — J449 Chronic obstructive pulmonary disease, unspecified: Secondary | ICD-10-CM | POA: Diagnosis not present

## 2020-05-12 NOTE — Telephone Encounter (Signed)
Pt is requesting a refill on her lopressor

## 2020-05-15 NOTE — Telephone Encounter (Signed)
Patients husband is to make Tam aware she needs to contact Dr Stanford Breed for refills.

## 2020-05-22 DIAGNOSIS — R69 Illness, unspecified: Secondary | ICD-10-CM | POA: Diagnosis not present

## 2020-05-22 DIAGNOSIS — F419 Anxiety disorder, unspecified: Secondary | ICD-10-CM | POA: Diagnosis not present

## 2020-05-29 ENCOUNTER — Telehealth: Payer: Medicare HMO

## 2020-05-29 ENCOUNTER — Ambulatory Visit: Payer: Medicare HMO | Admitting: Pharmacist

## 2020-05-29 DIAGNOSIS — I5032 Chronic diastolic (congestive) heart failure: Secondary | ICD-10-CM

## 2020-05-29 DIAGNOSIS — IMO0002 Reserved for concepts with insufficient information to code with codable children: Secondary | ICD-10-CM

## 2020-05-29 DIAGNOSIS — I10 Essential (primary) hypertension: Secondary | ICD-10-CM

## 2020-05-29 DIAGNOSIS — F411 Generalized anxiety disorder: Secondary | ICD-10-CM

## 2020-05-29 DIAGNOSIS — E782 Mixed hyperlipidemia: Secondary | ICD-10-CM

## 2020-05-29 DIAGNOSIS — F32A Depression, unspecified: Secondary | ICD-10-CM

## 2020-05-29 NOTE — Patient Instructions (Signed)
Visit Information  Phone number for Pharmacist: [phone]  Goals Addressed            This Visit's Progress   . Pharmacy Care Plan       CARE PLAN ENTRY (see longitudinal plan of care for additional care plan information)  Current Barriers:  . Chronic Disease Management support, education, and care coordination needs related to Hypertension, Hyperlipidemia, Diabetes, Heart Failure, Depression, and Anxiety   Hypertension / Diastolic heart failure BP Readings from Last 3 Encounters:  05/03/20 (!) 146/84  04/13/20 (!) 142/84  03/01/20 124/70 .  Pharmacist Clinical Goal(s): o Over the next 180 days, patient will work with PharmD and providers to maintain BP goal <130/80 . Current regimen:  o amlodipine 5 mg twice a day o spironolactone 25 mg daily as needed for high BP o Hydralazine 100 mg twice a day o metoprolol tartrate 25 mg twice a day o Furosemide 20 mg as needed for swelling . Interventions: o Discussed benefits of medications and importance of adherence  . Patient self care activities - Over the next 180 days, patient will: o Check BP daily, document, and provide at future appointments o Ensure daily salt intake < 2300 mg/day  Hyperlipidemia Lab Results  Component Value Date/Time   LDLCALC 135 (H) 02/17/2020 11:16 AM .  Pharmacist Clinical Goal(s): o Over the next 180 days, patient will work with PharmD and providers to achieve LDL goal < 100 . Current regimen:  o Simvastatin 20 mg daily  . Interventions: o Discussed cholesterol goals and benefits of medications for prevention of heart attack / stroke o Discussed statins are safe and effective in liver cirrhosis due to NASH . Patient self care activities - Over the next 180 days, patient will: o Continue low cholesterol diet o Continue medication as prescribed  Diabetes Lab Results  Component Value Date/Time   HGBA1C 10.4 (H) 05/03/2020 03:17 PM   HGBA1C 11.0 (H) 10/22/2019 02:11 PM .  Pharmacist  Clinical Goal(s): o Over the next 30 days, patient will work with PharmD and providers to achieve A1c goal <7% and fasting blood sugar 80-130 . Current regimen:  o Basaglar 80 units twice a day o Rybelsus 3 mg daily (plan to titrate to 7 mg after 30 days) . 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 Patient self care activities - Over the next 30 days, patient will: o Check blood sugar twice daily, document, and provide at future appointments o Contact provider with any episodes of hypoglycemia  Chronic pain / Arthritis . Pharmacist Clinical Goal(s) o Over the next 30 days, patient will work with PharmD and providers to optimize therapy and reduce cost . Current regimen:  o morphine ER 30 mg every 12 hours o celecoxib 100 mg twice a day o Tylenol 1000 mg twice a day o tizanidine 2 mg every 6 hrs as needed o Gabapentin 300 mg 3 times daily as needed . Interventions: o Tier exception approved for celecoxib and gabapentin through 08/25/2020 . Patient self care activities - Over the next 30 days, patient will: o Continue medication as prescribed o Discuss reducing morphine dose with [facility] pain management  Medication management . Pharmacist Clinical Goal(s): o Over the next 180 days, patient will work with PharmD and providers to achieve optimal medication adherence . Current pharmacy: [facility] . Interventions o Comprehensive medication review performed. o Utilize  [facility] pharmacy for medication synchronization, packaging and delivery . Patient self care activities - Over the next 30 days, patient will: o Focus on medication adherence by pill pack o Take medications as prescribed o Report any questions or concerns to PharmD and/or provider(s)  Please see past updates related to this goal by clicking on the "Past Updates" button in the  selected goal       Patient verbalizes understanding of instructions provided today.  Telephone follow up appointment with pharmacy team member scheduled for: 1 month  Charlene Brooke, PharmD, [facility] Clinical Pharmacist [facility] [phone]

## 2020-05-29 NOTE — Chronic Care Management (AMB) (Signed)
Chronic Care Management Pharmacy  Name: Mary Acosta  MRN: [id number] DOB: 09-25-1955  Chief Complaint/ HPI  [name],  64 y.o. , female presents for their Follow-Up CCM visit with the clinical pharmacist via telephone due to COVID-19 Pandemic.  PCP : Binnie Rail, MD  Their chronic conditions include: HTN, diastolic CHF, HLD, OSA, U7OZ, asthma/chronic respiratory failure, NASH, hypothyroidism, neuropathy, hx thyroid cancer, anxiety, fibromyalgia  Pt living with husband and son. She is grieving the sudden death of her 44 year old grandson in May 2020. She follows with [facility] psychiatry and pain management. She has difficulty affording medications.  Office Visits: 05/04/20 Dr Quay Burow OV: checked c-peptide, it was normal so added Rybelsus. Also increased levothyroxine to 200 mcg  03/01/20 Dr Quay Burow OV: acute visit for UTI, elevated sugars. Rx'd Keflex for UTI.  01/21/20 Dr Quay Burow VV: acute cold sx. Start doxycycline, continue inhalers, otc medications.  10/22/19 Dr Quay Burow OV: anxiety/depression related to grandson's death, managed per psych. DM uncontrolled d/t inability to afford insulin. Changed to Lantus.  Consult Visit: 03/20/20 Dr Doren Custard [facility] spine/neurosurg): steroid injection in hip. 03/18/20 Dr Bryan Lemma (Primary care [facility]): acute visit for fever, sore throat. Rec'd UC or ER visit.  03/01/20 Dr Loletha Carrow (GI): f/u for cirrhosis. Recommended statin for ASCVD due to low risk causing worsening liver function.  02/18/20 cardiology: started simvastatin 20 mg (orders only), due to no improvement in LDL since starting ezetimibe - however prescriber was not aware pt never started taking ezetimibe due to cost. Agreed to hold off on statin until pt can talk to GI.  01/12/20 Dr Elsworth Soho (pulmonary): OSA - failed CPAP. Rec'd sleep upright on wedge pillow, wt loss. Rx'd furosemide 40 mg x 1 month, PCP for further refills.  01/11/20 [facility] neurosurgery - L knee genicular nerve  block.  12/09/19 NP Cleaver (cardiology): BP labile at home, no changes in BP meds. Started ezetimibe 10 mg daily.  08/10/19 Dr Loanne Drilling (endocrine): change Lantus to Toujeo 180 units qAM and apply for PAP. Check BG BID  07/06/19-07/07/19 Hospital admission: atypical chest/back pain, resolved spontaneously. Rec' close f/u with PCP and consider increasing diuretic doses.   06/23/19 Dr Rosario Jacks (GI): liver lesion on [place], too small for characterization. Cirrhosis (compensated), splenomegaly stable, rec'd restart furosemide daily - pt was previously not taking d/t fear of low K.   05/17/19 NP Cleaver (cardiology): continue same meds (including atorvastatin)   Allergies  Allergen Reactions  . Gabapentin Swelling    Swelling in legs Swelling in legs Swelling in legs  . Losartan Other (See Comments)    Myalgias and muscle cramping Other reaction(s): Other (See Comments) Myalgias and muscle cramping Myalgias and muscle cramping Other reaction(s): Other (See Comments) Myalgias and muscle cramping  . Aricept [Donepezil Hcl]     Nausea/vomiting, low BP  . Oxycodone Itching  . Sulfa Antibiotics Nausea Only and Rash  . Sulfonamide Derivatives Nausea Only    Medications: Outpatient Encounter Medications as of 05/29/2020  Medication Sig Note  . acetaminophen (TYLENOL) 500 MG tablet Take 1,000 mg by mouth 2 (two) times daily.   Marland Kitchen albuterol (VENTOLIN HFA) 108 (90 Base) MCG/ACT inhaler TAKE 2 PUFFS BY MOUTH EVERY 6 HOURS AS NEEDED FOR WHEEZE OR SHORTNESS OF BREATH   . amitriptyline (ELAVIL) 100 MG tablet Take 100 mg by mouth at bedtime.   Marland Kitchen amLODipine (NORVASC) 5 MG tablet Take 1 tablet (5 mg total) by mouth 2 (two) times daily.   Marland Kitchen aspirin EC  81 MG tablet Take 1 tablet (81 mg total) by mouth daily.   . celecoxib (CELEBREX) 100 MG capsule Take 100 mg by mouth 2 (two) times daily.    Marland Kitchen ezetimibe (ZETIA) 10 MG tablet Take 1 tablet (10 mg total) by mouth daily.   . furosemide (LASIX) 40 MG tablet Take  1 tablet (40 mg total) by mouth daily.   Marland Kitchen gabapentin (NEURONTIN) 300 MG capsule Take 300 mg by mouth 3 (three) times daily.    . hydrALAZINE (APRESOLINE) 100 MG tablet Take 1 tablet (100 mg total) by mouth 2 (two) times daily.   . hydrOXYzine (ATARAX/VISTARIL) 25 MG tablet Take 1 tablet (25 mg total) by mouth in the morning and at bedtime.   . insulin glargine (LANTUS) 100 UNIT/ML injection Inject 1.8 mLs (180 Units total) into the skin daily. (Patient taking differently: Inject 75 Units into the skin 2 (two) times daily. ) 02/14/2020: Basaglar via [place] PAP  . Insulin Pen Needle (PEN NEEDLES) 31G X 8 MM MISC 1 Device by Does not apply route daily.   . Insulin Syringe-Needle U-100 26G X 1/2" 1 ML MISC Use daily for insulin injection as directed   . ipratropium-albuterol (DUONEB) 0.5-2.5 (3) MG/3ML SOLN Take 3 mLs by nebulization every 6 (six) hours as needed (Wheezing or dyspnea.).   Marland Kitchen levothyroxine (SYNTHROID) 200 MCG tablet Take 1 tablet (200 mcg total) by mouth daily before breakfast.   . metoprolol tartrate (LOPRESSOR) 25 MG tablet Take 1 tablet (25 mg total) by mouth 2 (two) times daily.   . mirtazapine (REMERON) 30 MG tablet Take 30 mg by mouth daily.   Marland Kitchen morphine (MS CONTIN) 30 MG 12 hr tablet Take 30 mg by mouth 2 (two) times daily.   . ondansetron (ZOFRAN-ODT) 4 MG disintegrating tablet Take 1 tablet (4 mg total) by mouth every 6 (six) hours as needed for nausea or vomiting.   Marland Kitchen PARoxetine (PAXIL) 40 MG tablet Take 1 tablet (40 mg total) by mouth daily.   . potassium chloride SA (KLOR-CON M20) 20 MEQ tablet TAKE 2 TABLETS EVERY DAY AS NEEDED FOR CRAMPING   . Semaglutide (RYBELSUS) 3 MG TABS Take 3 mg by mouth daily.   Marland Kitchen tiZANidine (ZANAFLEX) 2 MG tablet Take 2 mg by mouth as needed for muscle spasms.     No facility-administered encounter medications on file as of 05/29/2020.     Current Diagnosis/Assessment:    Goals Addressed            This Visit's Progress   .  Pharmacy Care Plan       CARE PLAN ENTRY (see longitudinal plan of care for additional care plan information)  Current Barriers:  . Chronic Disease Management support, education, and care coordination needs related to Hypertension, Hyperlipidemia, Diabetes, Heart Failure, Depression, and Anxiety   Hypertension / Diastolic heart failure BP Readings from Last 3 Encounters:  05/03/20 (!) 146/84  04/13/20 (!) 142/84  03/01/20 124/70 .  Pharmacist Clinical Goal(s): o Over the next 180 days, patient will work with PharmD and providers to maintain BP goal <130/80 . Current regimen:  o amlodipine 5 mg twice a day o spironolactone 25 mg daily as needed for high BP o Hydralazine 100 mg twice a day o metoprolol tartrate 25 mg twice a day o Furosemide 20 mg as needed for swelling . Interventions: o Discussed benefits of medications and importance of adherence  . Patient self care activities - Over the next 180 days, patient will:  o Check BP daily, document, and provide at future appointments o Ensure daily salt intake < 2300 mg/day  Hyperlipidemia Lab Results  Component Value Date/Time   LDLCALC 135 (H) 02/17/2020 11:16 AM .  Pharmacist Clinical Goal(s): o Over the next 180 days, patient will work with PharmD and providers to achieve LDL goal < 100 . Current regimen:  o Simvastatin 20 mg daily  . Interventions: o Discussed cholesterol goals and benefits of medications for prevention of heart attack / stroke o Discussed statins are safe and effective in liver cirrhosis due to NASH . Patient self care activities - Over the next 180 days, patient will: o Continue low cholesterol diet o Continue medication as prescribed  Diabetes Lab Results  Component Value Date/Time   HGBA1C 10.4 (H) 05/03/2020 03:17 PM   HGBA1C 11.0 (H) 10/22/2019 02:11 PM .  Pharmacist Clinical Goal(s): o Over the next 30 days, patient will work with PharmD and providers to achieve A1c goal <7% and fasting blood  sugar 80-130 . Current regimen:  o Basaglar 80 units twice a day o Rybelsus 3 mg daily (plan to titrate to 7 mg after 30 days) . 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 Patient self care activities - Over the next 30 days, patient will: o Check blood sugar twice daily, document, and provide at future appointments o Contact provider with any episodes of hypoglycemia  Chronic pain / Arthritis . Pharmacist Clinical Goal(s) o Over the next 30 days, patient will work with PharmD and providers to optimize therapy and reduce cost . Current regimen:  o morphine ER 30 mg every 12 hours o celecoxib 100 mg twice a day o Tylenol 1000 mg twice a day o tizanidine 2 mg every 6 hrs as needed o Gabapentin 300 mg 3 times daily as needed . Interventions: o Tier exception approved for celecoxib and gabapentin through 08/25/2020 . Patient self care activities - Over the next 30 days, patient will: o Continue medication as prescribed o Discuss reducing morphine dose with [facility] pain management  Medication management . Pharmacist Clinical Goal(s): o Over the next 180 days, patient will work with PharmD and providers to achieve optimal medication adherence . Current pharmacy: [facility] . Interventions o Comprehensive medication review performed. o Utilize [facility] pharmacy for medication synchronization, packaging and delivery . Patient self care activities - Over the next 30 days, patient will: o Focus on medication adherence by pill pack o Take medications as prescribed o Report any questions or concerns to PharmD and/or provider(s)  Please see past updates related to this goal by clicking on the "Past Updates" button in the selected goal        Diabetes (Type 2)   A1c goal < 8% DM2 diagnosis confirmed via C-Peptide  Ref. Range 05/03/2020 15:17   C-Peptide Latest Ref Range: 0.80 - 3.85 ng/mL 1.52   Recent Relevant Labs: Lab Results  Component Value Date/Time   HGBA1C 10.4 (H) 05/03/2020 03:17 PM   HGBA1C 11.0 (H) 10/22/2019 02:11 PM   MICROALBUR <0.7 09/19/2017 02:26 PM   MICROALBUR 0.8 02/05/2017 11:09 AM   Last diabetic Foot exam:  Lab Results  Component Value Date/Time   HMDIABEYEEXA No Retinopathy 05/29/2016 12:00 AM    Last diabetic Eye exam: No results found for: HMDIABFOOTEX   Checking BG: 2x per Day   Fasting BG: 120-150  Patient has  failed these meds in past: Toujeo, NPH, Basaglar, Lantus, metformin, Actos Patient is currently uncontrolled on the following medications:   Basaglar 80 units BID (via [employer])  Rybelsus 3 mg daily ([employer] PAP pending)  We discussed: patient is tolerating Rybelsus without issue; BG much improved since starting GLP1 agonist; as Rybelsus dose is titrated up, insulin dose will most likely need to be decreased to prevent hypoglycemia; pt agreed to contact office if BG < 100 or symptoms of hypoglycemia occur  Plan  Continue current medications Check status of Rybelsus application with [employer] (faxed 05/25/20)  Hypertension / Diastolic HF   HF type: Diastolic Last ejection fraction: 60-65% (06/2019)  BP goal < 130/80  Office blood pressures are  BP Readings from Last 3 Encounters:  05/03/20 (!) 146/84  04/13/20 (!) 142/84  03/01/20 124/70   Kidney Function Lab Results  Component Value Date/Time   CREATININE 0.97 05/03/2020 03:17 PM   CREATININE 0.98 10/22/2019 02:11 PM   CREATININE 0.87 07/20/2019 01:51 PM   GFR 57.25 (L) 10/22/2019 02:11 PM   GFRNONAA 62 05/03/2020 03:17 PM   GFRAA 72 05/03/2020 03:17 PM   K 4.8 05/03/2020 03:17 PM   K 4.8 10/22/2019 02:11 PM   Patient has failed these meds in the past: HCTZ, valsartan, losartan, lisinopril Patient is currently uncontrolled on the following medications:   amlodipine 5 mg BID,   spironolactone 25 mg  PRN when BP elevated  hydralazine 100 mg BID  metoprolol tartrate 25 mg BID   Furosemide 40 mg as needed  potassium 40 mEq PRN (cramping)  We discussed: Pt is taking medications as prescribed and BP is fairly well controlled. Pt had issues with labile BP in the past, important to continue monitoring at home.   Plan  Continue current medications  Continue home BP monitoring  Hyperlipidemia   LDL goal < 70 Aortic atherosclerosis  Lipid Panel     Component Value Date/Time   CHOL 126 05/03/2020 1517   CHOL 193 02/17/2020 1116   TRIG 54 05/03/2020 1517   HDL 58 05/03/2020 1517   HDL 48 02/17/2020 1116   CHOLHDL 2.2 05/03/2020 1517   VLDL 35.8 10/22/2019 1411   LDLCALC 55 05/03/2020 1517   LABVLDL 10 02/17/2020 1116    Hepatic Function Latest Ref Rng & Units 05/03/2020 02/17/2020 10/22/2019  Total Protein 6.1 - 8.1 g/dL 7.1 6.6 8.0  Albumin 3.8 - 4.8 g/dL - 3.9 4.2  AST 10 - 35 U/L _0 ALT 6 - 29 U/L _1 Alk Phosphatase 48 - 121 IU/L - 115 114  Total Bilirubin 0.2 - 1.2 mg/dL 0.5 0.3 0.7  Bilirubin, Direct 0.00 - 0.40 mg/dL - 0.13 -    The ASCVD Risk score Mikey Bussing DC Jr., et al., 2013) failed to calculate for the following reasons:   The patient has a prior MI or stroke diagnosis  Patient has tried/failed these meds in past: ezetimibe Patient is currently uncontrolled on the following medications:   aspirin 81 mg daily  Simvastatin 20 mg daily  Ezetimibe 10 mg daily  We discussed:  Pt is now taking simvastatin after discussions with cardiology and GI, all agree that statin is beneficial for ASCVD risk reduction in NASH. Patient has been taking simvastatin and ezetimibe together since mid-August 2021. Repeat lipid panel 05/03/20 reflects almost 4 weeks on both drugs. LDL is at goal and LFTs are unchanged, it is safe to continue combination.  Plan  Continue  current medications   Depression/anxiety   Depression screen [facility] 2/9 01/28/2020 09/04/2018 06/13/2017   Decreased Interest 1 0 0  Down, Depressed, Hopeless 0 0 0  PHQ - 2 Score 1 0 0  Some recent data might be hidden   Patient has failed these meds in past: sertraline, Pristiq, buspirone. Hydroxyzine, alprazolam, duloxetine, diazepam, mirtazapine Patient is currently controlled on the following medications:   paroxetine 40 mg - PM  amitriptyline 100 mg HS  We discussed: Pt reports good control over mood and bladder spasms with this combination. She stopped taking mirtazapine due to increase in appetite, and denies issues since stopping it.   Plan  Continue current medications   Chronic pain/ arthritis/ fibromyalgia   Patient has failed these meds in past: gabapentin, meloxicam Patient is currently uncontrolled on the following medications:   morphine ER 30 mg q12h,   celecoxib 100 mg BID,   tylenol 1000 mg BID,   tizanidine 2 mg PRN  Gabapentin 300 mg TID prn   Hydroxyzine 25 mg BID  We discussed: pt is trying to wean off of morphine, using NSAIDs to help with pain. She is going to talk to pain clinic about reducing her morphine to 15 mg.  She reports gabapentin is no longer causing leg swelling.  Plan  Continue current medications    Medication Management   Pt uses [facility] for all medications 30-day pill packs Pt endorses compliance with medications as prescribed  We discussed:  Reviewed patient's [facility] medication and Epic medication profile assuring there are no discrepancies or gaps in therapy. Confirmed all fill dates appropriate and verified with patient that there is a sufficient quantity of all prescribed medications at home. Informed patient to call me any time if needing medications before scheduled deliveries.   Plan  Utilize [facility] pharmacy for medication synchronization, packaging and delivery    Follow up: 1 month phone visit  Charlene Brooke, PharmD Clinical Pharmacist [facility] [phone]

## 2020-05-30 ENCOUNTER — Other Ambulatory Visit: Payer: Self-pay

## 2020-05-31 ENCOUNTER — Encounter: Payer: Self-pay | Admitting: Cardiology

## 2020-05-31 ENCOUNTER — Telehealth: Payer: Self-pay | Admitting: Pharmacist

## 2020-05-31 ENCOUNTER — Telehealth (INDEPENDENT_AMBULATORY_CARE_PROVIDER_SITE_OTHER): Payer: Medicare HMO | Admitting: Cardiology

## 2020-05-31 VITALS — Ht 69.0 in | Wt 315.0 lb

## 2020-05-31 DIAGNOSIS — E1042 Type 1 diabetes mellitus with diabetic polyneuropathy: Secondary | ICD-10-CM | POA: Diagnosis not present

## 2020-05-31 DIAGNOSIS — E662 Morbid (severe) obesity with alveolar hypoventilation: Secondary | ICD-10-CM

## 2020-05-31 DIAGNOSIS — G4733 Obstructive sleep apnea (adult) (pediatric): Secondary | ICD-10-CM

## 2020-05-31 DIAGNOSIS — I5032 Chronic diastolic (congestive) heart failure: Secondary | ICD-10-CM | POA: Diagnosis not present

## 2020-05-31 DIAGNOSIS — I10 Essential (primary) hypertension: Secondary | ICD-10-CM | POA: Diagnosis not present

## 2020-05-31 DIAGNOSIS — G894 Chronic pain syndrome: Secondary | ICD-10-CM

## 2020-05-31 DIAGNOSIS — I471 Supraventricular tachycardia: Secondary | ICD-10-CM | POA: Diagnosis not present

## 2020-05-31 NOTE — Progress Notes (Signed)
Virtual Visit via Telephone Note   This visit type was conducted due to national recommendations for restrictions regarding the COVID-19 Pandemic (e.g. social distancing) in an effort to limit this patient's exposure and mitigate transmission in our community.  Due to her co-morbid illnesses, this patient is at least at moderate risk for complications without adequate follow up.  This format is felt to be most appropriate for this patient at this time.  The patient did not have access to video technology/had technical difficulties with video requiring transitioning to audio format only (telephone).  All issues noted in this document were discussed and addressed.  No physical exam could be performed with this format.  Please refer to the patient's chart for her  consent to telehealth for [facility].    Date:  05/31/2020   ID:  Mary Acosta, DOB 1955/10/04, MRN [id number] The patient was identified using 2 identifiers.  Patient Location: Home Provider Location: Home Office  PCP:  Binnie Rail, MD  Cardiologist:  Kirk Ruths, MD  Electrophysiologist:  None   Evaluation Performed:  Follow-Up Visit  Chief Complaint:  No cardiac complaints  History of Present Illness:    Mary Acosta is a 64 y.o. female with a history of diastolic heart failure, morbid obesity, sleep apnea intolerant to CPAP, chronic pain syndrome on narcotics, COPD and pulmonary hypertension, insulin-dependent diabetes, and a history of PSVT.  In April 2019 she presented with PSVT with a right bundle branch block.  She was treated with beta-blockers, this has not recurred since.  Prior cardiac studies include cardiac catheterization in 2006 that showed normal coronaries.  Coronary CT in May 2012 that showed a calcium score of 0.  Echocardiogram September 2019 showed ejection fraction of 60 to 65% with grade 1 diastolic dysfunction.  The patient was contacted today for routine 97-month follow-up.   Since we saw her last she has been doing well from a cardiac standpoint.  She denies any palpitations or tachycardia.  She has chronic pain and this limits what she can do.  She does not get out much, her husband used to take her out to the store but he is suffering from emphysema.  She has tried several different CPAP devices but is intolerant.  She sleeps on a wedge with O2 at night.  Her main complaint to me was pain in her back and her knees, she is followed at [facility] pain management.  The patient does not have symptoms concerning for COVID-19 infection (fever, chills, cough, or new shortness of breath).    Past Medical History:  Diagnosis Date  . AKI (acute kidney injury) ([place]) 01/2017  . Anxiety    with panic attacks  . Arthritis    "back; feet; hands; shoulders" (08/26/2014)  . Asthma   . Cervical cancer ([place])   . Chronic lower back pain   . Chronic narcotic use   . Chronic pain syndrome    [facility]  . Cirrhosis ([place])   . Clostridium difficile infection 2017  . COPD (chronic obstructive pulmonary disease) ([place])   . Daily headache   . Depression   . Diabetic neuropathy ([place]) 06/04/2017  . DJD (degenerative joint disease)   . Fatty liver disease, nonalcoholic   . Fibromyalgia   . Frequency of urination   . HCAP (healthcare-associated pneumonia) 08/26/2014  . History of TIA (transient ischemic attack) 11-01-2010   NO RESIDUAL  . Hyperlipidemia   . Hypertension   .  Hypothyroidism   . IDDM (insulin dependent diabetes mellitus)   . Insomnia   . Lumbar stenosis   . Memory difficulty 07/25/2016  . Nocturia   . OSA (obstructive sleep apnea)    NO CPAP SINCE WT LOSS  . Osteoarthritis    with severe disease in knee  . Pneumonia "several times"  . Polymyalgia rheumatica ([place])   . Scoliosis   . Seasonal allergies   . Thyroid cancer ([place])   . Urgency of urination   . Vaginal pain S/P SLING  FEB 2012   Past Surgical History:  Procedure Laterality Date  .  APPENDECTOMY  1982  . BREAST EXCISIONAL BIOPSY Left 02/28/2005   Atypical Ductal Hyperplasia  . CARDIAC CATHETERIZATION  09-04-2004   NORMAL CORONARY ANATOMY/ NORMAL LVF/ EF 60%  . CARDIOVASCULAR STRESS TEST  12-27-2010  DR [place]   ABNORMAL NUCLEAR STUDY W/ /MILD INFERIOR ISCHEMIA/ EF 69%/  CT HEART ANGIOGRAM ;  NO ACUTE FINDINGS  . CRYOABLATION  05/16/2003   w/LEEP FOR ABNORMAL PAP SMEAR  . CYSTOSCOPY  05/18/2012   Procedure: CYSTOSCOPY;  Surgeon: Reece Packer, MD;  Location: [facility];  Service: Urology;  Laterality: N/A;  examination under anethesia  . ESOPHAGOGASTRODUODENOSCOPY (EGD) WITH PROPOFOL N/A 09/03/2016   Procedure: ESOPHAGOGASTRODUODENOSCOPY (EGD) WITH PROPOFOL;  Surgeon: Doran Stabler, MD;  Location: [facility];  Service: Gastroenterology;  Laterality: N/A;  . HYSTEROSCOPY WITH D & C  08-19-2007   PMB  . KNEE ARTHROSCOPY W/ DEBRIDEMENT Left 03/29/2006   INTERNAL DERANGEMENT/ SEVERE DJD/ MENISCUS TEARS  . LAPAROSCOPIC CHOLECYSTECTOMY  06-10-2002  . LAPAROSCOPIC GASTRIC BANDING  03/01/2006   TRUNCAL VAGOTOMY/ PLACEMENT OF VG BAND  . REVISION TOTAL KNEE ARTHROPLASTY Left 08-29-2008; 05/2009  . TONSILLECTOMY  1969  . TOTAL KNEE ARTHROPLASTY Left 01-23-2007   SEVERE DJD  . TOTAL THYROIDECTOMY  11-22-2005   BILATERAL THYROID NODULES-- PAPILLARY CARCINOMA (0.5CM)/ ADENOMATOID NODULES  . TRANSTHORACIC ECHOCARDIOGRAM  12-27-2010   LVSF NORMAL / EF 33-82%/ GRADE I DIASTOLIC DYSFUNCTION/ MILD MITRAL REGURG. / MILDLY DILATED LEFT ATRIUM/ MILDY INCREASED SYSTOLIC PRESSURE OF PULMONARY ARTERIES  . TRANSVAGINAL SUBURETERAL TAPE/ SLING  09-28-2010   MIXED URINARY INCONTINENCE  . TUBAL LIGATION  1983     Current Meds  Medication Sig  . acetaminophen (TYLENOL) 500 MG tablet Take 1,000 mg by mouth 2 (two) times daily.  Marland Kitchen albuterol (VENTOLIN HFA) 108 (90 Base) MCG/ACT inhaler TAKE 2 PUFFS BY MOUTH EVERY 6 HOURS AS NEEDED FOR WHEEZE OR SHORTNESS OF BREATH  .  amitriptyline (ELAVIL) 100 MG tablet Take 100 mg by mouth at bedtime.  Marland Kitchen amLODipine (NORVASC) 5 MG tablet Take 1 tablet (5 mg total) by mouth 2 (two) times daily.  Marland Kitchen aspirin EC 81 MG tablet Take 1 tablet (81 mg total) by mouth daily.  Marland Kitchen ezetimibe (ZETIA) 10 MG tablet Take 1 tablet (10 mg total) by mouth daily.  . furosemide (LASIX) 40 MG tablet Take 1 tablet (40 mg total) by mouth daily.  Marland Kitchen gabapentin (NEURONTIN) 300 MG capsule Take 300 mg by mouth 3 (three) times daily.   . hydrALAZINE (APRESOLINE) 100 MG tablet Take 1 tablet (100 mg total) by mouth 2 (two) times daily.  . hydrOXYzine (ATARAX/VISTARIL) 25 MG tablet Take 1 tablet (25 mg total) by mouth in the morning and at bedtime.  . insulin glargine (LANTUS) 100 UNIT/ML injection Inject 1.8 mLs (180 Units total) into the skin daily. (Patient taking differently: Inject 80 Units into the skin 2 (  two) times daily. )  . Insulin Pen Needle (PEN NEEDLES) 31G X 8 MM MISC 1 Device by Does not apply route daily.  . Insulin Syringe-Needle U-100 26G X 1/2" 1 ML MISC Use daily for insulin injection as directed  . ipratropium-albuterol (DUONEB) 0.5-2.5 (3) MG/3ML SOLN Take 3 mLs by nebulization every 6 (six) hours as needed (Wheezing or dyspnea.).  Marland Kitchen levothyroxine (SYNTHROID) 200 MCG tablet Take 1 tablet (200 mcg total) by mouth daily before breakfast.  . metoprolol tartrate (LOPRESSOR) 25 MG tablet Take 1 tablet (25 mg total) by mouth 2 (two) times daily.  Marland Kitchen morphine (MS CONTIN) 30 MG 12 hr tablet Take 30 mg by mouth 2 (two) times daily.  . ondansetron (ZOFRAN-ODT) 4 MG disintegrating tablet Take 1 tablet (4 mg total) by mouth every 6 (six) hours as needed for nausea or vomiting.  Marland Kitchen PARoxetine (PAXIL) 40 MG tablet Take 1 tablet (40 mg total) by mouth daily.  . potassium chloride SA (KLOR-CON M20) 20 MEQ tablet TAKE 2 TABLETS EVERY DAY AS NEEDED FOR CRAMPING  . Semaglutide (RYBELSUS) 3 MG TABS Take 3 mg by mouth daily.  . simvastatin (ZOCOR) 20 MG tablet  Take 20 mg by mouth daily.  Marland Kitchen tiZANidine (ZANAFLEX) 2 MG tablet Take 2 mg by mouth as needed for muscle spasms.      Allergies:   Gabapentin, Losartan, Aricept [donepezil hcl], Oxycodone, Sulfa antibiotics, and Sulfonamide derivatives   Social History   Tobacco Use  . Smoking status: Former Smoker    Packs/day: 2.50    Years: 39.00    Pack years: 97.50    Types: Cigarettes    Quit date: 10/22/2010    Years since quitting: 9.6  . Smokeless tobacco: Never Used  Vaping Use  . Vaping Use: Never used  Substance Use Topics  . Alcohol use: No    Alcohol/week: 0.0 standard drinks  . Drug use: No     Family Hx: The patient's family history includes Breast cancer in an other family member; Colon cancer in her maternal uncle; Dementia in her mother; Diabetes in her mother; Heart disease in her father and mother; High blood pressure in her father.  ROS:   Please see the history of present illness.    All other systems reviewed and are negative.   Prior CV studies:   The following studies were reviewed today:  Echo 07/07/2019- IMPRESSIONS    1. Left ventricular ejection fraction, by visual estimation, is 60 to  65%. The left ventricle has normal function. There is no left ventricular  hypertrophy.  2. Left ventricular diastolic parameters are consistent with Grade I  diastolic dysfunction (impaired relaxation).  3. Global right ventricle has normal systolic function.The right  ventricular size is normal. No increase in right ventricular wall  thickness.  4. Left atrial size was normal.  5. Right atrial size was normal.  6. The mitral valve is normal in structure. Trace mitral valve  regurgitation. No evidence of mitral stenosis.  7. The tricuspid valve is normal in structure. Tricuspid valve  regurgitation is not demonstrated.  8. The aortic valve was not well visualized. Aortic valve regurgitation  is not visualized. No evidence of aortic valve sclerosis or stenosis.   9. The pulmonic valve was normal in structure. Pulmonic valve  regurgitation is not visualized.  10. The inferior vena cava is normal in size with <50% respiratory  variability, suggesting right atrial pressure of 8 mmHg.   Labs/Other Tests and Data Reviewed:  EKG:  An ECG dated Nov 2020 was personally reviewed today and demonstrated:  NSR, 84, RBBB  Recent Labs: 05/03/2020: ALT 16; BUN 14; Creat 0.97; Hemoglobin 12.2; Platelets 174; Potassium 4.8; Sodium 138; TSH 4.42   Recent Lipid Panel Lab Results  Component Value Date/Time   CHOL 126 05/03/2020 03:17 PM   CHOL 193 02/17/2020 11:16 AM   TRIG 54 05/03/2020 03:17 PM   HDL 58 05/03/2020 03:17 PM   HDL 48 02/17/2020 11:16 AM   CHOLHDL 2.2 05/03/2020 03:17 PM   LDLCALC 55 05/03/2020 03:17 PM    Wt Readings from Last 3 Encounters:  05/31/20 (!) 315 lb (142.9 kg)  05/03/20 (!) 315 lb 3.2 oz (143 kg)  04/13/20 (!) 301 lb 2 oz (136.6 kg)     Objective:    Vital Signs:  Ht 5\' 9"  (1.753 m)   Wt (!) 315 lb (142.9 kg)   BMI 46.52 kg/m    VITAL SIGNS:  reviewed  ASSESSMENT & PLAN:    HTN- B/P controlled  PSVT- April 2019, no recurrence  OSA- C-pap intolerant  COPD/obesity hypoventilation- On O2 at night  Chronic pain- Followed at [facility] pain clinic  IDDM- Per PCP  Plan:  Same cardiac Rx- office visit in 6 months.    COVID-19 Education: The signs and symptoms of COVID-19 were discussed with the patient and how to seek care for testing (follow up with PCP or arrange E-visit).  The importance of social distancing was discussed today.  Time:   Today, I have spent 15 minutes with the patient with telehealth technology discussing the above problems.     Medication Adjustments/Labs and Tests Ordered: Current medicines are reviewed at length with the patient today.  Concerns regarding medicines are outlined above.   Tests Ordered: No orders of the defined types were placed in this encounter.   Medication  Changes: No orders of the defined types were placed in this encounter.   Follow Up:  In Person in 6 months with Dr Stanford Breed or an APP  Signed, Kerin Ransom, PA-C  05/31/2020 3:05 PM    [place]

## 2020-05-31 NOTE — Patient Instructions (Signed)

## 2020-05-31 NOTE — Progress Notes (Addendum)
Chronic Care Management Pharmacy Assistant   Name: Mary Acosta  MRN: [id number] DOB: 1955-10-19  Reason for Encounter: Medication Review  Patient Questions:  1.  Have you seen any other providers since your last visit? No  2.  Any changes in your medicines or health? No   PCP : Binnie Rail, MD  Allergies:   Allergies  Allergen Reactions  . Gabapentin Swelling    Swelling in legs Swelling in legs Swelling in legs  . Losartan Other (See Comments)    Myalgias and muscle cramping Other reaction(s): Other (See Comments) Myalgias and muscle cramping Myalgias and muscle cramping Other reaction(s): Other (See Comments) Myalgias and muscle cramping  . Aricept [Donepezil Hcl]     Nausea/vomiting, low BP  . Oxycodone Itching  . Sulfa Antibiotics Nausea Only and Rash  . Sulfonamide Derivatives Nausea Only    Medications: Outpatient Encounter Medications as of 05/31/2020  Medication Sig Note  . acetaminophen (TYLENOL) 500 MG tablet Take 1,000 mg by mouth 2 (two) times daily.   Marland Kitchen albuterol (VENTOLIN HFA) 108 (90 Base) MCG/ACT inhaler TAKE 2 PUFFS BY MOUTH EVERY 6 HOURS AS NEEDED FOR WHEEZE OR SHORTNESS OF BREATH   . amitriptyline (ELAVIL) 100 MG tablet Take 100 mg by mouth at bedtime.   Marland Kitchen amLODipine (NORVASC) 5 MG tablet Take 1 tablet (5 mg total) by mouth 2 (two) times daily.   Marland Kitchen aspirin EC 81 MG tablet Take 1 tablet (81 mg total) by mouth daily.   . celecoxib (CELEBREX) 100 MG capsule Take 100 mg by mouth 2 (two) times daily.    Marland Kitchen ezetimibe (ZETIA) 10 MG tablet Take 1 tablet (10 mg total) by mouth daily.   . furosemide (LASIX) 40 MG tablet Take 1 tablet (40 mg total) by mouth daily.   Marland Kitchen gabapentin (NEURONTIN) 300 MG capsule Take 300 mg by mouth 3 (three) times daily.    . hydrALAZINE (APRESOLINE) 100 MG tablet Take 1 tablet (100 mg total) by mouth 2 (two) times daily.   . hydrOXYzine (ATARAX/VISTARIL) 25 MG tablet Take 1 tablet (25 mg total) by mouth in the morning  and at bedtime.   . insulin glargine (LANTUS) 100 UNIT/ML injection Inject 1.8 mLs (180 Units total) into the skin daily. (Patient taking differently: Inject 80 Units into the skin 2 (two) times daily. ) 02/14/2020: Basaglar via [place] PAP  . Insulin Pen Needle (PEN NEEDLES) 31G X 8 MM MISC 1 Device by Does not apply route daily.   . Insulin Syringe-Needle U-100 26G X 1/2" 1 ML MISC Use daily for insulin injection as directed   . ipratropium-albuterol (DUONEB) 0.5-2.5 (3) MG/3ML SOLN Take 3 mLs by nebulization every 6 (six) hours as needed (Wheezing or dyspnea.).   Marland Kitchen levothyroxine (SYNTHROID) 200 MCG tablet Take 1 tablet (200 mcg total) by mouth daily before breakfast.   . metoprolol tartrate (LOPRESSOR) 25 MG tablet Take 1 tablet (25 mg total) by mouth 2 (two) times daily.   . mirtazapine (REMERON) 30 MG tablet Take 30 mg by mouth daily.   Marland Kitchen morphine (MS CONTIN) 30 MG 12 hr tablet Take 30 mg by mouth 2 (two) times daily.   . ondansetron (ZOFRAN-ODT) 4 MG disintegrating tablet Take 1 tablet (4 mg total) by mouth every 6 (six) hours as needed for nausea or vomiting.   Marland Kitchen PARoxetine (PAXIL) 40 MG tablet Take 1 tablet (40 mg total) by mouth daily.   . potassium chloride SA (KLOR-CON M20) 20  MEQ tablet TAKE 2 TABLETS EVERY DAY AS NEEDED FOR CRAMPING   . Semaglutide (RYBELSUS) 3 MG TABS Take 3 mg by mouth daily.   . simvastatin (ZOCOR) 20 MG tablet Take 20 mg by mouth daily.   Marland Kitchen tiZANidine (ZANAFLEX) 2 MG tablet Take 2 mg by mouth as needed for muscle spasms.     No facility-administered encounter medications on file as of 05/31/2020.    Current Diagnosis: Patient Active Problem List   Diagnosis Date Noted  . UTI (urinary tract infection) 03/01/2020  . Grief 02/03/2019  . Nausea 01/04/2019  . Reactive airway disease 10/21/2018  . Arthralgia 09/04/2018  . Narcotic overdose ([place]) 05/14/2018  . Thyroid cancer ([place]) 04/30/2018  . Obesity hypoventilation syndrome ([place]) 09/24/2017  .  Palpitations 09/18/2017  . Diabetic neuropathy ([place]) 06/04/2017  . Vitamin B12 deficiency 03/05/2017  . Numbness and tingling in both hands 02/24/2017  . OSA (obstructive sleep apnea) 01/31/2017  . Liver cirrhosis secondary to NASH ([place]) 01/31/2017  . Hypothyroidism 01/31/2017  . Chronic narcotic use 01/31/2017  . Fibromyalgia 01/31/2017  . Hyperlipidemia 01/31/2017  . Gastroparesis   . Memory disorder 07/25/2016  . Lump in neck 01/29/2016  . Chronic diastolic (congestive) heart failure ([place]) 01/03/2016  . Fatty liver 01/03/2016  . Type 1 diabetes mellitus ([place]) 01/03/2016  . Recurrent Clostridium difficile diarrhea 01/03/2016  . Chronic respiratory failure ([place]) 11/10/2014  . Physical deconditioning 09/26/2014  . Dyspnea 09/26/2014  . Iron deficiency anemia, unspecified  04/05/2011  . Bariatric surgery status 04/05/2011  . Left arm pain   . UNSPECIFIED VITAMIN D DEFICIENCY 10/22/2007  . LOW BACK PAIN, CHRONIC 10/22/2007  . INSOMNIA 10/22/2007  . Obesity 10/14/2007  . Chronic pain syndrome 10/14/2007  . CARPAL TUNNEL SYNDROME 10/14/2007  . ALLERGIC RHINITIS CAUSE UNSPECIFIED 10/14/2007  . ARTHRITIS 10/14/2007  . Anxiety state 08/25/2007  . Depression 08/25/2007  . Essential hypertension 08/25/2007  . ASTHMA 08/25/2007  . CONSTIPATION 08/25/2007  . POLYMYALGIA RHEUMATICA 08/25/2007  . LEG EDEMA, BILATERAL 08/25/2007    Goals Addressed   None     Follow-Up:  Care Coordination with Outside Provider  Reviewed chart for medication changes ahead of medication coordination call.  No OVs, Consults, or hospital visits since last care coordination call/Pharmacist visit.  No medication changes indicated   BP Readings from Last 3 Encounters:  05/03/20 (!) 146/84  04/13/20 (!) 142/84  03/01/20 124/70    Lab Results  Component Value Date   HGBA1C 10.4 (H) 05/03/2020     Patient obtains medications through Adherence Packaging  90 Days   Patient is due for next  adherence delivery on:06/06/20  Called patient and reviewed medications and coordinated delivery.  This delivery to include  Trazodone 50 mg; bedtime Paroxetine 40 mg; bedtime Hydroxyzine Hcl 25 mg; breakfast, bedtime Ezetimibe 10 mg; morning Simvastatin 20 mg; bedtime Metoprolol tar 25 mg; morning, bedtime Amitriptyline 100 mg; bedtime Hydralazine 100 mg; morning, bedtime Amlodipine 5 mg; morning, bedtime Tizanidine 2 mg; take one tab every 8 hrs as needed (VIAL) Morphine sulf Er 30 mg; take one tab bid (VIAL) Gabapentin 300 mg; take one cap three time daily (VIAL) Levothyroxine 175 mcg; before breakfast (Mon-Thurs only) Potassium cl Er 20 meq; take two tab as needed for cramping (Vial)  Patient needs refills for amtriptyline, prescriber contacted  Confirmed delivery date of 06/06/20, advised patient that pharmacy will contact them the morning of delivery.    Rosendo Gros, [facility]  Practice Team Manager/ CPA (Clinical Pharmacist Assistant)  [phone]    

## 2020-06-02 DIAGNOSIS — R0602 Shortness of breath: Secondary | ICD-10-CM | POA: Diagnosis not present

## 2020-06-02 NOTE — Progress Notes (Signed)
Patient reports celecoxib is not helping anymore, she has stopped taking it. She has requested Rx for meloxicam instead - she has taken this in the past.   Patient is also asking about a wheelchair due to leg and foot pain. Pt informed she will likely need an appt with PCP to discuss.  Forwarding requests to PCP.

## 2020-06-05 ENCOUNTER — Telehealth: Payer: Self-pay | Admitting: Internal Medicine

## 2020-06-05 DIAGNOSIS — R69 Illness, unspecified: Secondary | ICD-10-CM | POA: Diagnosis not present

## 2020-06-05 DIAGNOSIS — F419 Anxiety disorder, unspecified: Secondary | ICD-10-CM | POA: Diagnosis not present

## 2020-06-05 MED ORDER — IPRATROPIUM-ALBUTEROL 0.5-2.5 (3) MG/3ML IN SOLN: 3.0000 mL | Freq: Four times a day (QID) | RESPIRATORY_TRACT | 11 refills | Status: AC | PRN

## 2020-06-05 NOTE — Telephone Encounter (Signed)
Called [facility] and spoke with Maudie Mercury in regards to message received from [place]. Maudie Mercury said the Rx that they had was over 52 days old and they needed to have a new Rx sent in. Rx has been sent to pharmacy. Nothing further needed.

## 2020-06-05 NOTE — Progress Notes (Addendum)
Called patient to verify med changes discovered by pharmacy assistant. Patient will receive 30-day pill pack due to frequency of med changes and upcoming appt with endocrinology.  -pt is no longer taking spironolactone due to thinking it caused cramps. Previously she was only taking this as needed for high BP. It has been removed from med list.  -I am concerned combination of simvastatin + ezetimibe is causing the cramps. Recommend trial off ezetimibe to see if cramps improve. May also switch to hydrophillic statin (rosuvastatin, pravastatin) if she still has cramps with simvastatin alone.  - pt is no longer taking celecoxib due to not working anymore - it has been removed from med list. She had leftover Meloxicam from previous Rx and has been taking that.  - pt has levothyroxine 200 mg on hand from 90-day fill at [facility].   -scheduled appt with PCP for patient to discuss mobility issues/wheelchair

## 2020-06-11 DIAGNOSIS — J449 Chronic obstructive pulmonary disease, unspecified: Secondary | ICD-10-CM | POA: Diagnosis not present

## 2020-06-14 ENCOUNTER — Encounter: Payer: Self-pay | Admitting: Internal Medicine

## 2020-06-14 ENCOUNTER — Telehealth (INDEPENDENT_AMBULATORY_CARE_PROVIDER_SITE_OTHER): Payer: Medicare HMO | Admitting: Internal Medicine

## 2020-06-14 DIAGNOSIS — R5381 Other malaise: Secondary | ICD-10-CM

## 2020-06-14 DIAGNOSIS — E1042 Type 1 diabetes mellitus with diabetic polyneuropathy: Secondary | ICD-10-CM

## 2020-06-14 DIAGNOSIS — M545 Low back pain, unspecified: Secondary | ICD-10-CM

## 2020-06-14 DIAGNOSIS — J449 Chronic obstructive pulmonary disease, unspecified: Secondary | ICD-10-CM | POA: Diagnosis not present

## 2020-06-14 DIAGNOSIS — R296 Repeated falls: Secondary | ICD-10-CM | POA: Diagnosis not present

## 2020-06-14 DIAGNOSIS — G8929 Other chronic pain: Secondary | ICD-10-CM

## 2020-06-14 MED ORDER — RYBELSUS 7 MG PO TABS: 7.0000 mg | ORAL_TABLET | Freq: Every day | ORAL | 1 refills | Status: DC

## 2020-06-14 NOTE — Progress Notes (Signed)
Virtual Visit via Video Note  I connected with Mary Acosta on 06/14/20 at  3:30 PM EDT by a video enabled telemedicine application and verified that I am speaking with the correct person using two identifiers.   I discussed the limitations of evaluation and management by telemedicine and the availability of in person appointments. The patient expressed understanding and agreed to proceed.  Present for the visit:  Myself, Dr Billey Gosling, Wilhemina Bonito.  The patient is currently at home and I am in the office.    No referring provider.    History of Present Illness: The patient is here for an acute visit.  She fell yesterday - she was getting ice out of the ice maker.  Her knee gave out on her.  This is the knee she had replaced.  The knee does hurt and feels weak.  She does not trust it and she is anxious that she has been a fall again.   She can not walk far.  She needs something to help her ambulate.  She has been using a walker recently and it helps some.  She is unsure how far she can walk with the walker.  She initially thought she wanted a wheelchair, but states she would not be able to use a wheelchair in her house.  She needs more rybelsus - no side effects.  [name] was working on pt assistance.  Her sugars have been better controlled in the 100s.  She does have an appoint with endocrine next month.   Social History   Socioeconomic History  . Marital status: Married    Spouse name: Not on file  . Number of children: 2  . Years of education: 20  . Highest education level: Not on file  Occupational History  . Occupation: disabled    Fish farm manager: UNEMPLOYED  Tobacco Use  . Smoking status: Former Smoker    Packs/day: 2.50    Years: 39.00    Pack years: 97.50    Types: Cigarettes    Quit date: 10/22/2010    Years since quitting: 9.6  . Smokeless tobacco: Never Used  Vaping Use  . Vaping Use: Never used  Substance and Sexual Activity  . Alcohol use: No     Alcohol/week: 0.0 standard drinks  . Drug use: No  . Sexual activity: Not Currently  Other Topics Concern  . Not on file  Social History Narrative   Lives at home w/ her husband    Right-handed   Caffeine: 1 cup of coffee per week + Pepsi   Social Determinants of Health   Financial Resource Strain: High Risk  . Difficulty of Paying Living Expenses: Hard  Food Insecurity:   . Worried About Charity fundraiser in the Last Year: Not on file  . Ran Out of Food in the Last Year: Not on file  Transportation Needs:   . Lack of Transportation (Medical): Not on file  . Lack of Transportation (Non-Medical): Not on file  Physical Activity:   . Days of Exercise per Week: Not on file  . Minutes of Exercise per Session: Not on file  Stress:   . Feeling of Stress : Not on file  Social Connections:   . Frequency of Communication with Friends and Family: Not on file  . Frequency of Social Gatherings with Friends and Family: Not on file  . Attends Religious Services: Not on file  . Active Member of Clubs or Organizations: Not on file  .  Attends Archivist Meetings: Not on file  . Marital Status: Not on file     Observations/Objective: Appears well in NAD Breathing normally Skin appears warm and dry  Assessment and Plan:  See Problem List for Assessment and Plan of chronic medical problems.   Follow Up Instructions:    I discussed the assessment and treatment plan with the patient. The patient was provided an opportunity to ask questions and all were answered. The patient agreed with the plan and demonstrated an understanding of the instructions.   The patient was advised to call back or seek an in-person evaluation if the symptoms worsen or if the condition fails to improve as anticipated.    Binnie Rail, MD

## 2020-06-14 NOTE — Assessment & Plan Note (Signed)
Chronic ?  Diabetes type 1 or 2-Dr. Loanne Drilling had used both Sugars better controlled since starting Rybelsus-we will increase to 7 mg daily-sent to pharmacy Continue Lantus insulin 40 units twice daily She has an appointment with endocrine next month Stressed better compliance with a diabetic diet.  Stressed the importance of getting her sugars better controlled Discussed the importance of weight loss

## 2020-06-14 NOTE — Assessment & Plan Note (Signed)
Chronic in nature She is obese, has chronic back pain, chronic knee pain/joint pain Has had recurrent falls Using walker to ambulate-continue Stressed the importance of physical therapy-we will order home PT

## 2020-06-14 NOTE — Assessment & Plan Note (Signed)
Chronic Following with pain management This does affect her mobility Has had recurrent falls and is deconditioned Referral ordered for home PT

## 2020-06-14 NOTE — Progress Notes (Signed)
Subjective:    Patient ID: Mary Acosta, female    DOB: Jul 14, 1956, 64 y.o.   MRN: [id number]  HPI The patient is here for an acute visit.  She fell yesterday - she was getting ice out of the ice maker.  Her knee gave out on her.  This is the knee she had replaced.  The knee does hurt and feels weak.  She does not trust it and she is anxious that she has been a fall again.   She can not walk far.  She needs something to help her ambulate.  She has been using a walker recently and it helps some.  She is unsure how far she can walk with the walker.  She initially thought she wanted a wheelchair, but states she would not be able to use a wheelchair in her house.  She needs more rybelsus - no side effects.  Mary Acosta was working on pt assistance.  Her sugars have been better controlled in the 100s.  She does have an appoint with endocrine next month.    Medications and allergies reviewed with patient and updated if appropriate.  Patient Active Problem List   Diagnosis Date Noted  . PSVT (paroxysmal supraventricular tachycardia) ([place]) 05/31/2020  . UTI (urinary tract infection) 03/01/2020  . Grief 02/03/2019  . Nausea 01/04/2019  . Reactive airway disease 10/21/2018  . Arthralgia 09/04/2018  . Narcotic overdose ([place]) 05/14/2018  . Thyroid cancer ([place]) 04/30/2018  . Obesity hypoventilation syndrome ([place]) 09/24/2017  . Palpitations 09/18/2017  . Diabetic neuropathy ([place]) 06/04/2017  . Vitamin B12 deficiency 03/05/2017  . Numbness and tingling in both hands 02/24/2017  . OSA (obstructive sleep apnea) 01/31/2017  . Liver cirrhosis secondary to NASH ([place]) 01/31/2017  . Hypothyroidism 01/31/2017  . Chronic narcotic use 01/31/2017  . Fibromyalgia 01/31/2017  . Hyperlipidemia 01/31/2017  . Gastroparesis   . Memory disorder 07/25/2016  . Lump in neck 01/29/2016  . Chronic diastolic (congestive) heart failure ([place]) 01/03/2016  . Fatty liver 01/03/2016  . Type 1 diabetes  mellitus ([place]) 01/03/2016  . Recurrent Clostridium difficile diarrhea 01/03/2016  . Chronic respiratory failure ([place]) 11/10/2014  . Physical deconditioning 09/26/2014  . Dyspnea 09/26/2014  . Iron deficiency anemia, unspecified  04/05/2011  . Bariatric surgery status 04/05/2011  . Left arm pain   . UNSPECIFIED VITAMIN D DEFICIENCY 10/22/2007  . LOW BACK PAIN, CHRONIC 10/22/2007  . INSOMNIA 10/22/2007  . Obesity 10/14/2007  . Chronic pain syndrome 10/14/2007  . CARPAL TUNNEL SYNDROME 10/14/2007  . ALLERGIC RHINITIS CAUSE UNSPECIFIED 10/14/2007  . ARTHRITIS 10/14/2007  . Anxiety state 08/25/2007  . Depression 08/25/2007  . Essential hypertension 08/25/2007  . ASTHMA 08/25/2007  . CONSTIPATION 08/25/2007  . POLYMYALGIA RHEUMATICA 08/25/2007  . LEG EDEMA, BILATERAL 08/25/2007    Current Outpatient Medications on File Prior to Visit  Medication Sig Dispense Refill  . acetaminophen (TYLENOL) 500 MG tablet Take 1,000 mg by mouth 2 (two) times daily.    Marland Kitchen albuterol (VENTOLIN HFA) 108 (90 Base) MCG/ACT inhaler TAKE 2 PUFFS BY MOUTH EVERY 6 HOURS AS NEEDED FOR WHEEZE OR SHORTNESS OF BREATH 18 g 11  . amitriptyline (ELAVIL) 100 MG tablet Take 100 mg by mouth at bedtime.    Marland Kitchen amLODipine (NORVASC) 5 MG tablet Take 1 tablet (5 mg total) by mouth 2 (two) times daily. 180 tablet 1  . aspirin EC 81 MG tablet Take 1 tablet (81 mg total) by mouth daily. 90 tablet 3  .  ezetimibe (ZETIA) 10 MG tablet Take 1 tablet (10 mg total) by mouth daily. 30 tablet 1  . furosemide (LASIX) 40 MG tablet Take 1 tablet (40 mg total) by mouth daily. 30 tablet 0  . gabapentin (NEURONTIN) 300 MG capsule Take 300 mg by mouth 3 (three) times daily.     . hydrALAZINE (APRESOLINE) 100 MG tablet Take 1 tablet (100 mg total) by mouth 2 (two) times daily. 180 tablet 1  . hydrOXYzine (ATARAX/VISTARIL) 25 MG tablet Take 1 tablet (25 mg total) by mouth in the morning and at bedtime. 180 tablet 1  . insulin glargine (LANTUS) 100  UNIT/ML injection Inject 1.8 mLs (180 Units total) into the skin daily. (Patient taking differently: Inject 40 Units into the skin 2 (two) times daily. ) 60 mL 11  . Insulin Pen Needle (PEN NEEDLES) 31G X 8 MM MISC 1 Device by Does not apply route daily. 90 each 3  . Insulin Syringe-Needle U-100 26G X 1/2" 1 ML MISC Use daily for insulin injection as directed 100 each 3  . ipratropium-albuterol (DUONEB) 0.5-2.5 (3) MG/3ML SOLN Take 3 mLs by nebulization every 6 (six) hours as needed (Wheezing or dyspnea.). 360 mL 11  . levothyroxine (SYNTHROID) 200 MCG tablet Take 1 tablet (200 mcg total) by mouth daily before breakfast. 90 tablet 1  . meloxicam (MOBIC) 7.5 MG tablet Take 7.5 mg by mouth daily.    . metoprolol tartrate (LOPRESSOR) 25 MG tablet Take 1 tablet (25 mg total) by mouth 2 (two) times daily. 180 tablet 3  . morphine (MS CONTIN) 30 MG 12 hr tablet Take 30 mg by mouth 2 (two) times daily.    . ondansetron (ZOFRAN-ODT) 4 MG disintegrating tablet Take 1 tablet (4 mg total) by mouth every 6 (six) hours as needed for nausea or vomiting. 30 tablet 3  . PARoxetine (PAXIL) 40 MG tablet Take 1 tablet (40 mg total) by mouth daily. 90 tablet 1  . potassium chloride SA (KLOR-CON M20) 20 MEQ tablet TAKE 2 TABLETS EVERY DAY AS NEEDED FOR CRAMPING 180 tablet 1  . simvastatin (ZOCOR) 20 MG tablet Take 20 mg by mouth daily.    Marland Kitchen tiZANidine (ZANAFLEX) 2 MG tablet Take 2 mg by mouth as needed for muscle spasms.   2   No current facility-administered medications on file prior to visit.    Past Medical History:  Diagnosis Date  . AKI (acute kidney injury) ([place]) 01/2017  . Anxiety    with panic attacks  . Arthritis    "back; feet; hands; shoulders" (08/26/2014)  . Asthma   . Cervical cancer ([place])   . Chronic lower back pain   . Chronic narcotic use   . Chronic pain syndrome    [facility]  . Cirrhosis ([place])   . Clostridium difficile infection 2017  . COPD (chronic obstructive pulmonary  disease) ([place])   . Daily headache   . Depression   . Diabetic neuropathy ([place]) 06/04/2017  . DJD (degenerative joint disease)   . Fatty liver disease, nonalcoholic   . Fibromyalgia   . Frequency of urination   . HCAP (healthcare-associated pneumonia) 08/26/2014  . History of TIA (transient ischemic attack) 11-01-2010   NO RESIDUAL  . Hyperlipidemia   . Hypertension   . Hypothyroidism   . IDDM (insulin dependent diabetes mellitus)   . Insomnia   . Lumbar stenosis   . Memory difficulty 07/25/2016  . Nocturia   . OSA (obstructive sleep apnea)  NO CPAP SINCE WT LOSS  . Osteoarthritis    with severe disease in knee  . Pneumonia "several times"  . Polymyalgia rheumatica ([place])   . Scoliosis   . Seasonal allergies   . Thyroid cancer ([place])   . Urgency of urination   . Vaginal pain S/P SLING  FEB 2012    Past Surgical History:  Procedure Laterality Date  . APPENDECTOMY  1982  . BREAST EXCISIONAL BIOPSY Left 02/28/2005   Atypical Ductal Hyperplasia  . CARDIAC CATHETERIZATION  09-04-2004   NORMAL CORONARY ANATOMY/ NORMAL LVF/ EF 60%  . CARDIOVASCULAR STRESS TEST  12-27-2010  DR [place]   ABNORMAL NUCLEAR STUDY W/ /MILD INFERIOR ISCHEMIA/ EF 69%/  CT HEART ANGIOGRAM ;  NO ACUTE FINDINGS  . CRYOABLATION  05/16/2003   w/LEEP FOR ABNORMAL PAP SMEAR  . CYSTOSCOPY  05/18/2012   Procedure: CYSTOSCOPY;  Surgeon: Reece Packer, MD;  Location: [facility];  Service: Urology;  Laterality: N/A;  examination under anethesia  . ESOPHAGOGASTRODUODENOSCOPY (EGD) WITH PROPOFOL N/A 09/03/2016   Procedure: ESOPHAGOGASTRODUODENOSCOPY (EGD) WITH PROPOFOL;  Surgeon: Doran Stabler, MD;  Location: [facility];  Service: Gastroenterology;  Laterality: N/A;  . HYSTEROSCOPY WITH D & C  08-19-2007   PMB  . KNEE ARTHROSCOPY W/ DEBRIDEMENT Left 03/29/2006   INTERNAL DERANGEMENT/ SEVERE DJD/ MENISCUS TEARS  . LAPAROSCOPIC CHOLECYSTECTOMY  06-10-2002  . LAPAROSCOPIC GASTRIC BANDING   03/01/2006   TRUNCAL VAGOTOMY/ PLACEMENT OF VG BAND  . REVISION TOTAL KNEE ARTHROPLASTY Left 08-29-2008; 05/2009  . TONSILLECTOMY  1969  . TOTAL KNEE ARTHROPLASTY Left 01-23-2007   SEVERE DJD  . TOTAL THYROIDECTOMY  11-22-2005   BILATERAL THYROID NODULES-- PAPILLARY CARCINOMA (0.5CM)/ ADENOMATOID NODULES  . TRANSTHORACIC ECHOCARDIOGRAM  12-27-2010   LVSF NORMAL / EF 56-43%/ GRADE I DIASTOLIC DYSFUNCTION/ MILD MITRAL REGURG. / MILDLY DILATED LEFT ATRIUM/ MILDY INCREASED SYSTOLIC PRESSURE OF PULMONARY ARTERIES  . TRANSVAGINAL SUBURETERAL TAPE/ SLING  09-28-2010   MIXED URINARY INCONTINENCE  . TUBAL LIGATION  1983    Social History   Socioeconomic History  . Marital status: Married    Spouse name: Not on file  . Number of children: 2  . Years of education: 48  . Highest education level: Not on file  Occupational History  . Occupation: disabled    Fish farm manager: UNEMPLOYED  Tobacco Use  . Smoking status: Former Smoker    Packs/day: 2.50    Years: 39.00    Pack years: 97.50    Types: Cigarettes    Quit date: 10/22/2010    Years since quitting: 9.6  . Smokeless tobacco: Never Used  Vaping Use  . Vaping Use: Never used  Substance and Sexual Activity  . Alcohol use: No    Alcohol/week: 0.0 standard drinks  . Drug use: No  . Sexual activity: Not Currently  Other Topics Concern  . Not on file  Social History Narrative   Lives at home w/ her husband    Right-handed   Caffeine: 1 cup of coffee per week + Pepsi   Social Determinants of Health   Financial Resource Strain: High Risk  . Difficulty of Paying Living Expenses: Hard  Food Insecurity:   . Worried About Charity fundraiser in the Last Year: Not on file  . Ran Out of Food in the Last Year: Not on file  Transportation Needs:   . Lack of Transportation (Medical): Not on file  . Lack of Transportation (Non-Medical): Not on file  Physical Activity:   .  Days of Exercise per Week: Not on file  . Minutes of Exercise per  Session: Not on file  Stress:   . Feeling of Stress : Not on file  Social Connections:   . Frequency of Communication with Friends and Family: Not on file  . Frequency of Social Gatherings with Friends and Family: Not on file  . Attends Religious Services: Not on file  . Active Member of Clubs or Organizations: Not on file  . Attends Archivist Meetings: Not on file  . Marital Status: Not on file    Family History  Problem Relation Age of Onset  . Diabetes Mother   . Heart disease Mother   . Dementia Mother   . Heart disease Father   . High blood pressure Father   . Colon cancer Maternal Uncle        x 2  . Breast cancer Other        great aunts x 5    Review of Systems     Objective:  There were no vitals filed for this visit. BP Readings from Last 3 Encounters:  05/03/20 (!) 146/84  04/13/20 (!) 142/84  03/01/20 124/70   Wt Readings from Last 3 Encounters:  05/31/20 (!) 315 lb (142.9 kg)  05/03/20 (!) 315 lb 3.2 oz (143 kg)  04/13/20 (!) 301 lb 2 oz (136.6 kg)   There is no height or weight on file to calculate BMI.   Physical Exam         Assessment & Plan:    See Problem List for Assessment and Plan of chronic medical problems.    This visit occurred during the SARS-CoV-2 public health emergency.  Safety protocols were in place, including screening questions prior to the visit, additional usage of staff PPE, and extensive cleaning of exam room while observing appropriate contact time as indicated for disinfecting solutions.

## 2020-06-16 ENCOUNTER — Other Ambulatory Visit: Payer: Self-pay | Admitting: Internal Medicine

## 2020-06-16 ENCOUNTER — Telehealth: Payer: Self-pay | Admitting: Internal Medicine

## 2020-06-16 DIAGNOSIS — G4733 Obstructive sleep apnea (adult) (pediatric): Secondary | ICD-10-CM

## 2020-06-16 DIAGNOSIS — I10 Essential (primary) hypertension: Secondary | ICD-10-CM

## 2020-06-16 NOTE — Telephone Encounter (Signed)
° ° °  [facility] calling to report start of care with be next week, sooner if staffing becomes available

## 2020-06-17 DIAGNOSIS — E119 Type 2 diabetes mellitus without complications: Secondary | ICD-10-CM | POA: Diagnosis not present

## 2020-06-20 ENCOUNTER — Telehealth: Payer: Self-pay | Admitting: Internal Medicine

## 2020-06-20 NOTE — Telephone Encounter (Signed)
Mary Acosta at [facility] calling to make [place] aware that the start of care date is 06/23/20

## 2020-06-21 DIAGNOSIS — G4733 Obstructive sleep apnea (adult) (pediatric): Secondary | ICD-10-CM | POA: Diagnosis not present

## 2020-06-21 DIAGNOSIS — J45909 Unspecified asthma, uncomplicated: Secondary | ICD-10-CM | POA: Diagnosis not present

## 2020-06-21 DIAGNOSIS — I471 Supraventricular tachycardia: Secondary | ICD-10-CM | POA: Diagnosis not present

## 2020-06-21 DIAGNOSIS — I110 Hypertensive heart disease with heart failure: Secondary | ICD-10-CM | POA: Diagnosis not present

## 2020-06-21 DIAGNOSIS — I509 Heart failure, unspecified: Secondary | ICD-10-CM | POA: Diagnosis not present

## 2020-06-21 DIAGNOSIS — E1140 Type 2 diabetes mellitus with diabetic neuropathy, unspecified: Secondary | ICD-10-CM | POA: Diagnosis not present

## 2020-06-21 DIAGNOSIS — E1159 Type 2 diabetes mellitus with other circulatory complications: Secondary | ICD-10-CM | POA: Diagnosis not present

## 2020-06-21 DIAGNOSIS — M545 Low back pain, unspecified: Secondary | ICD-10-CM | POA: Diagnosis not present

## 2020-06-21 DIAGNOSIS — J9610 Chronic respiratory failure, unspecified whether with hypoxia or hypercapnia: Secondary | ICD-10-CM | POA: Diagnosis not present

## 2020-06-21 DIAGNOSIS — G8929 Other chronic pain: Secondary | ICD-10-CM | POA: Diagnosis not present

## 2020-06-27 DIAGNOSIS — M5137 Other intervertebral disc degeneration, lumbosacral region: Secondary | ICD-10-CM | POA: Diagnosis not present

## 2020-06-27 DIAGNOSIS — M533 Sacrococcygeal disorders, not elsewhere classified: Secondary | ICD-10-CM | POA: Diagnosis not present

## 2020-06-27 DIAGNOSIS — M25561 Pain in right knee: Secondary | ICD-10-CM | POA: Diagnosis not present

## 2020-06-27 DIAGNOSIS — M25562 Pain in left knee: Secondary | ICD-10-CM | POA: Diagnosis not present

## 2020-06-27 DIAGNOSIS — G894 Chronic pain syndrome: Secondary | ICD-10-CM | POA: Diagnosis not present

## 2020-06-27 DIAGNOSIS — M542 Cervicalgia: Secondary | ICD-10-CM | POA: Diagnosis not present

## 2020-06-27 DIAGNOSIS — R296 Repeated falls: Secondary | ICD-10-CM | POA: Diagnosis not present

## 2020-06-28 ENCOUNTER — Telehealth: Payer: Self-pay | Admitting: Internal Medicine

## 2020-06-28 DIAGNOSIS — J9610 Chronic respiratory failure, unspecified whether with hypoxia or hypercapnia: Secondary | ICD-10-CM | POA: Diagnosis not present

## 2020-06-28 DIAGNOSIS — I471 Supraventricular tachycardia: Secondary | ICD-10-CM | POA: Diagnosis not present

## 2020-06-28 DIAGNOSIS — M545 Low back pain, unspecified: Secondary | ICD-10-CM | POA: Diagnosis not present

## 2020-06-28 DIAGNOSIS — G8929 Other chronic pain: Secondary | ICD-10-CM | POA: Diagnosis not present

## 2020-06-28 DIAGNOSIS — J45909 Unspecified asthma, uncomplicated: Secondary | ICD-10-CM | POA: Diagnosis not present

## 2020-06-28 DIAGNOSIS — G4733 Obstructive sleep apnea (adult) (pediatric): Secondary | ICD-10-CM | POA: Diagnosis not present

## 2020-06-28 DIAGNOSIS — I110 Hypertensive heart disease with heart failure: Secondary | ICD-10-CM | POA: Diagnosis not present

## 2020-06-28 DIAGNOSIS — E1140 Type 2 diabetes mellitus with diabetic neuropathy, unspecified: Secondary | ICD-10-CM | POA: Diagnosis not present

## 2020-06-28 DIAGNOSIS — I509 Heart failure, unspecified: Secondary | ICD-10-CM | POA: Diagnosis not present

## 2020-06-28 DIAGNOSIS — E1159 Type 2 diabetes mellitus with other circulatory complications: Secondary | ICD-10-CM | POA: Diagnosis not present

## 2020-06-28 NOTE — Telephone Encounter (Signed)
ok 

## 2020-06-28 NOTE — Telephone Encounter (Signed)
Orders given.  

## 2020-06-28 NOTE — Telephone Encounter (Signed)
    Mallory from [facility] requesting order for home OT 1x week for 1 2x week for 2 1x week for 4  Phone [phone], ok to  leave a message

## 2020-06-30 DIAGNOSIS — J45909 Unspecified asthma, uncomplicated: Secondary | ICD-10-CM | POA: Diagnosis not present

## 2020-06-30 DIAGNOSIS — G4733 Obstructive sleep apnea (adult) (pediatric): Secondary | ICD-10-CM | POA: Diagnosis not present

## 2020-06-30 DIAGNOSIS — E1140 Type 2 diabetes mellitus with diabetic neuropathy, unspecified: Secondary | ICD-10-CM | POA: Diagnosis not present

## 2020-06-30 DIAGNOSIS — G8929 Other chronic pain: Secondary | ICD-10-CM | POA: Diagnosis not present

## 2020-06-30 DIAGNOSIS — I509 Heart failure, unspecified: Secondary | ICD-10-CM | POA: Diagnosis not present

## 2020-06-30 DIAGNOSIS — E1159 Type 2 diabetes mellitus with other circulatory complications: Secondary | ICD-10-CM | POA: Diagnosis not present

## 2020-06-30 DIAGNOSIS — I110 Hypertensive heart disease with heart failure: Secondary | ICD-10-CM | POA: Diagnosis not present

## 2020-06-30 DIAGNOSIS — J9610 Chronic respiratory failure, unspecified whether with hypoxia or hypercapnia: Secondary | ICD-10-CM | POA: Diagnosis not present

## 2020-06-30 DIAGNOSIS — M545 Low back pain, unspecified: Secondary | ICD-10-CM | POA: Diagnosis not present

## 2020-06-30 DIAGNOSIS — I471 Supraventricular tachycardia: Secondary | ICD-10-CM | POA: Diagnosis not present

## 2020-07-04 ENCOUNTER — Telehealth: Payer: Self-pay | Admitting: Pharmacist

## 2020-07-04 NOTE — Progress Notes (Addendum)
Chronic Care Management Pharmacy Assistant   Name: Mary Acosta  MRN: [id number] DOB: [birth year]  Reason for Encounter: Medication Review  Patient Questions:  1.  Have you seen any other providers since your last visit? Yes, patient states that she went to pain clinic 06/27/2020  2.  Any changes in your medicines or health? No    PCP : Binnie Rail, MD  Allergies:   Allergies  Allergen Reactions  . Gabapentin Swelling    Swelling in legs Swelling in legs Swelling in legs  . Losartan Other (See Comments)    Myalgias and muscle cramping Other reaction(s): Other (See Comments) Myalgias and muscle cramping Myalgias and muscle cramping Other reaction(s): Other (See Comments) Myalgias and muscle cramping  . Aricept [Donepezil Hcl]     Nausea/vomiting, low BP  . Oxycodone Itching  . Sulfa Antibiotics Nausea Only and Rash  . Sulfonamide Derivatives Nausea Only    Medications: Outpatient Encounter Medications as of 07/04/2020  Medication Sig Note  . acetaminophen (TYLENOL) 500 MG tablet Take 1,000 mg by mouth 2 (two) times daily.   Marland Kitchen albuterol (VENTOLIN HFA) 108 (90 Base) MCG/ACT inhaler TAKE 2 PUFFS BY MOUTH EVERY 6 HOURS AS NEEDED FOR WHEEZE OR SHORTNESS OF BREATH   . amitriptyline (ELAVIL) 100 MG tablet Take 100 mg by mouth at bedtime.   Marland Kitchen amLODipine (NORVASC) 5 MG tablet Take 1 tablet (5 mg total) by mouth 2 (two) times daily.   Marland Kitchen aspirin EC 81 MG tablet Take 1 tablet (81 mg total) by mouth daily.   Marland Kitchen ezetimibe (ZETIA) 10 MG tablet Take 1 tablet (10 mg total) by mouth daily. 06/05/2020: Holding due to leg cramps  . furosemide (LASIX) 40 MG tablet TAKE ONE TABLET BY MOUTH dAILY   . gabapentin (NEURONTIN) 300 MG capsule Take 300 mg by mouth 3 (three) times daily.    . hydrALAZINE (APRESOLINE) 100 MG tablet Take 1 tablet (100 mg total) by mouth 2 (two) times daily.   . hydrOXYzine (ATARAX/VISTARIL) 25 MG tablet Take 1 tablet (25 mg total) by mouth in the morning  and at bedtime.   . insulin glargine (LANTUS) 100 UNIT/ML injection Inject 1.8 mLs (180 Units total) into the skin daily. (Patient taking differently: Inject 40 Units into the skin 2 (two) times daily. ) 02/14/2020: Basaglar via [place] PAP  . Insulin Pen Needle (PEN NEEDLES) 31G X 8 MM MISC 1 Device by Does not apply route daily.   . Insulin Syringe-Needle U-100 26G X 1/2" 1 ML MISC Use daily for insulin injection as directed   . ipratropium-albuterol (DUONEB) 0.5-2.5 (3) MG/3ML SOLN Take 3 mLs by nebulization every 6 (six) hours as needed (Wheezing or dyspnea.).   Marland Kitchen levothyroxine (SYNTHROID) 200 MCG tablet Take 1 tablet (200 mcg total) by mouth daily before breakfast.   . meloxicam (MOBIC) 7.5 MG tablet Take 7.5 mg by mouth daily.   . metoprolol tartrate (LOPRESSOR) 25 MG tablet Take 1 tablet (25 mg total) by mouth 2 (two) times daily.   Marland Kitchen morphine (MS CONTIN) 30 MG 12 hr tablet Take 30 mg by mouth 2 (two) times daily.   . ondansetron (ZOFRAN-ODT) 4 MG disintegrating tablet Take 1 tablet (4 mg total) by mouth every 6 (six) hours as needed for nausea or vomiting.   Marland Kitchen PARoxetine (PAXIL) 40 MG tablet Take 1 tablet (40 mg total) by mouth daily.   . potassium chloride SA (KLOR-CON M20) 20 MEQ tablet TAKE 2 TABLETS EVERY  DAY AS NEEDED FOR CRAMPING   . Semaglutide (RYBELSUS) 7 MG TABS Take 7 mg by mouth daily before breakfast.   . simvastatin (ZOCOR) 20 MG tablet Take 20 mg by mouth daily.   Marland Kitchen tiZANidine (ZANAFLEX) 2 MG tablet Take 2 mg by mouth as needed for muscle spasms.     No facility-administered encounter medications on file as of 07/04/2020.    Current Diagnosis: Patient Active Problem List   Diagnosis Date Noted  . PSVT (paroxysmal supraventricular tachycardia) ([place]) 05/31/2020  . UTI (urinary tract infection) 03/01/2020  . Grief 02/03/2019  . Nausea 01/04/2019  . Reactive airway disease 10/21/2018  . Arthralgia 09/04/2018  . Narcotic overdose ([place]) 05/14/2018  . Thyroid cancer  ([place]) 04/30/2018  . Obesity hypoventilation syndrome ([place]) 09/24/2017  . Palpitations 09/18/2017  . Diabetic neuropathy ([place]) 06/04/2017  . Vitamin B12 deficiency 03/05/2017  . Numbness and tingling in both hands 02/24/2017  . OSA (obstructive sleep apnea) 01/31/2017  . Liver cirrhosis secondary to NASH ([place]) 01/31/2017  . Hypothyroidism 01/31/2017  . Chronic narcotic use 01/31/2017  . Fibromyalgia 01/31/2017  . Hyperlipidemia 01/31/2017  . Gastroparesis   . Memory disorder 07/25/2016  . Lump in neck 01/29/2016  . Chronic diastolic (congestive) heart failure ([place]) 01/03/2016  . Fatty liver 01/03/2016  . Type 1 diabetes mellitus ([place]) 01/03/2016  . Recurrent Clostridium difficile diarrhea 01/03/2016  . Chronic respiratory failure ([place]) 11/10/2014  . Physical deconditioning 09/26/2014  . Dyspnea 09/26/2014  . Iron deficiency anemia, unspecified  04/05/2011  . Bariatric surgery status 04/05/2011  . Left arm pain   . UNSPECIFIED VITAMIN D DEFICIENCY 10/22/2007  . LOW BACK PAIN, CHRONIC 10/22/2007  . INSOMNIA 10/22/2007  . Obesity 10/14/2007  . Chronic pain syndrome 10/14/2007  . CARPAL TUNNEL SYNDROME 10/14/2007  . ALLERGIC RHINITIS CAUSE UNSPECIFIED 10/14/2007  . ARTHRITIS 10/14/2007  . Anxiety state 08/25/2007  . Depression 08/25/2007  . Essential hypertension 08/25/2007  . ASTHMA 08/25/2007  . CONSTIPATION 08/25/2007  . POLYMYALGIA RHEUMATICA 08/25/2007  . LEG EDEMA, BILATERAL 08/25/2007    Goals Addressed   None     Follow-Up:  Coordination of Enhanced Pharmacy Services and Pharmacist Review    Reviewed chart for medication changes ahead of medication coordination call.  No OVs, Consults, or hospital visits since last care coordination call/Pharmacist visit.  No medication changes indicated   BP Readings from Last 3 Encounters:  05/03/20 (!) 146/84  04/13/20 (!) 142/84  03/01/20 124/70    Lab Results  Component Value Date   HGBA1C 10.4 (H) 05/03/2020       Patient obtains medications through Adherence Packaging  30 Days   Last adherence delivery included:   Metoprolol tartrate 25 mg 1 tab breakfast and 1 tab bedtime Simvastatin 20 mg 1 tab bedtime Amlodipine 5 mg breakfast and 1 tab bedtime Hydralazine 100 mg 1 tab breakfast and 1 tab bedtime Amitriptyline 100 1 tab bedtime  Hydroxyzine Hcl 25 mg 1 tab breakfast and 1 tab bedtime Levothyroxine 175 mcg 1 tab daily breakfast Paroxetine 40 mg 1 tab bedtime Gabapentin 300 mg 1 cap 3x daily as needed (vial) Furosemide 40 mg 1 tab daily Trazodone 100 mg 1 tab bedtime  Tizanidine 2 mg 1 tab every 8 hours as needed (vial)   Patient is due for next adherence delivery on: 07/11/2020. Called patient and reviewed medications and coordinated delivery.  This delivery to include:    furosemide 40 mg 1 tab daily   Trazodone 100 mg; bedtime Paroxetine 40  mg; bedtime Hydroxyzine Hcl 25 mg; breakfast, bedtime Simvastatin 20 mg; bedtime Metoprolol tar 25 mg; morning, bedtime Amitriptyline 100 mg; bedtime Hydralazine 100 mg; morning, bedtime Amlodipine 5 mg; morning, bedtime Tizanidine 2 mg; take one tab every 8 hrs as needed (VIAL) Morphine sulf Er 30 mg; take one tab bid (VIAL) Morphine sulf ER 15 mg: 1 tab daily (VIAL) Gabapentin 300 mg; take one cap three time daily (VIAL) Furosemide 40 mg 1 tablet as needed (VIAL)   Patient declined the following medications: Potassium due to abundant supply on hand  Patient needs refills for Trazodone 100 mg prescribed by Dr. Leanord Hawking  Confirmed delivery date of 07/11/2020, advised patient that pharmacy will contact them the morning of delivery.  Wendy Poet, Clinical Pharmacist Assistant [facility]

## 2020-07-05 ENCOUNTER — Other Ambulatory Visit: Payer: Self-pay

## 2020-07-05 ENCOUNTER — Encounter: Payer: Self-pay | Admitting: Internal Medicine

## 2020-07-05 ENCOUNTER — Ambulatory Visit (INDEPENDENT_AMBULATORY_CARE_PROVIDER_SITE_OTHER): Payer: Medicare HMO | Admitting: Internal Medicine

## 2020-07-05 VITALS — BP 130/82 | HR 83 | Ht 69.0 in | Wt 297.6 lb

## 2020-07-05 DIAGNOSIS — Z794 Long term (current) use of insulin: Secondary | ICD-10-CM

## 2020-07-05 DIAGNOSIS — I471 Supraventricular tachycardia: Secondary | ICD-10-CM | POA: Diagnosis not present

## 2020-07-05 DIAGNOSIS — G4733 Obstructive sleep apnea (adult) (pediatric): Secondary | ICD-10-CM | POA: Diagnosis not present

## 2020-07-05 DIAGNOSIS — E1140 Type 2 diabetes mellitus with diabetic neuropathy, unspecified: Secondary | ICD-10-CM | POA: Diagnosis not present

## 2020-07-05 DIAGNOSIS — E1165 Type 2 diabetes mellitus with hyperglycemia: Secondary | ICD-10-CM | POA: Insufficient documentation

## 2020-07-05 DIAGNOSIS — E890 Postprocedural hypothyroidism: Secondary | ICD-10-CM

## 2020-07-05 DIAGNOSIS — J45909 Unspecified asthma, uncomplicated: Secondary | ICD-10-CM | POA: Diagnosis not present

## 2020-07-05 DIAGNOSIS — G8929 Other chronic pain: Secondary | ICD-10-CM | POA: Diagnosis not present

## 2020-07-05 DIAGNOSIS — J9610 Chronic respiratory failure, unspecified whether with hypoxia or hypercapnia: Secondary | ICD-10-CM | POA: Diagnosis not present

## 2020-07-05 DIAGNOSIS — Z85850 Personal history of malignant neoplasm of thyroid: Secondary | ICD-10-CM | POA: Diagnosis not present

## 2020-07-05 DIAGNOSIS — I110 Hypertensive heart disease with heart failure: Secondary | ICD-10-CM | POA: Diagnosis not present

## 2020-07-05 DIAGNOSIS — I509 Heart failure, unspecified: Secondary | ICD-10-CM | POA: Diagnosis not present

## 2020-07-05 DIAGNOSIS — IMO0002 Reserved for concepts with insufficient information to code with codable children: Secondary | ICD-10-CM | POA: Insufficient documentation

## 2020-07-05 DIAGNOSIS — E1159 Type 2 diabetes mellitus with other circulatory complications: Secondary | ICD-10-CM | POA: Diagnosis not present

## 2020-07-05 DIAGNOSIS — M545 Low back pain, unspecified: Secondary | ICD-10-CM | POA: Diagnosis not present

## 2020-07-05 LAB — POCT GLUCOSE (DEVICE FOR HOME USE): Glucose Fasting, POC: 243 mg/dL — AB (ref 70–99)

## 2020-07-05 LAB — POCT GLYCOSYLATED HEMOGLOBIN (HGB A1C): Hemoglobin A1C: 8.1 % — AB (ref 4.0–5.6)

## 2020-07-05 MED ORDER — METFORMIN HCL 500 MG PO TABS: 500.0000 mg | ORAL_TABLET | Freq: Two times a day (BID) | ORAL | 3 refills | Status: DC

## 2020-07-05 NOTE — Progress Notes (Signed)
Patient ID: Mary Acosta, female   DOB: August 11, 1956, 64 y.o.   MRN: [id number]   This visit occurred during the SARS-CoV-2 public health emergency.  Safety protocols were in place, including screening questions prior to the visit, additional usage of staff PPE, and extensive cleaning of exam room while observing appropriate contact time as indicated for disinfecting solutions.   HPI: Mary Acosta is a 64 y.o.-year-old female, referred by her PCP, Dr. Quay Burow, for management of DM2, dx in 2000, insulin-dependent since 2011, uncontrolled, with long-term complications (peripheral neuropathy, gastroparesis, cerebrovascular disease with history of TIA 2012, DKA 2015).  Patient also has a history of remote thyroid cancer and postsurgical hypothyroidism.  She is on my colleague, Dr. Loanne Drilling, up to 07/2019.  She would like to switch to seeing me.  DM2: Reviewed HbA1c levels: Lab Results  Component Value Date   HGBA1C 10.4 (H) 05/03/2020   HGBA1C 11.0 (H) 10/22/2019   HGBA1C 9.4 (H) 07/20/2019   HGBA1C 8.8 (H) 04/01/2019   HGBA1C 10.5 (A) 10/08/2018   HGBA1C 10.8 (H) 09/04/2018   HGBA1C 9.9 (H) 06/01/2018   HGBA1C 9.8 (A) 04/30/2018   HGBA1C 9.9 (A) 02/24/2018   HGBA1C 9.9 (A) 02/24/2018   Pt is on a regimen of: - Toujeo 180 units daily >> Basaglar 80 units at bedtime [employer] She was on Rybelsus samples 3 mg daily - out for last 2 weeks - she now applied to get this through the Pt. assistance pgm. She was on N and R insulin in the past. She was on metformin in the past >> did not work.  Pt checks her sugars 2-3x a day and they are: - am: 140-150 on Rybelsus 3 mg; up to 200 off Rybelsus - 2h after b'fast: n/c - before lunch: n/c - 2h after lunch: n/c - before dinner: 130-150 on Rybelsus 3 mg; up to 250 off Rybelsus - 2h after dinner: n/c - bedtime: n/c - nighttime: n/c Lowest sugar was 130; she has hypoglycemia awareness at 70.  Highest sugar was 600.  Glucometer:  AccuChek, Prodigy  Pt's meals are: - Breakfast: bacon or sausage and eggs, cereal  - stopped - Lunch: salad; hot dog - Dinner: meat + veggies - Snacks: not so much anymore + Cool Aid.  She is status post gastric banding with vagotomy in 2008.  She lost 148 pounds afterwards but gained >100 pounds back.  No h/o pancreatitis or FH of MTC.  - no CKD, last BUN/creatinine:  Lab Results  Component Value Date   BUN 14 05/03/2020   BUN 18 10/22/2019   CREATININE 0.97 05/03/2020   CREATININE 0.98 10/22/2019  Not on ACE inhibitor/ARB.  -+ HL last set of lipids: Lab Results  Component Value Date   CHOL 126 05/03/2020   HDL 58 05/03/2020   LDLCALC 55 05/03/2020   TRIG 54 05/03/2020   CHOLHDL 2.2 05/03/2020  On Zocor 20, Zetia 10.  - last eye exam was in 04/2020. ? DR. Gets IO injections for macular degeneration.  - + pain, numbness and tingling in her feet.  On ASA 81.  Pt has FH of DM in mother.  Per review of the chart she also has a history of thyroid cancer, status post total thyroidectomy - [place] Sx 2007. 11/22/2005: THYROID, THYROIDECTOMY:  - PAPILLARY THYROID CARCINOMA (0.5 CM).  - ADENOMATOID NODULES.   COMMENT  ONCOLOGY TABLE-THYROID   1. Maximum tumor size (cm): 0.5 cm  2. Tumor location: Right  mid lobe  3. Multifocality: Not identified  4. Histology: Papillary thyroid carcinoma  5. Margins: Negative  6. Capsular invasion: Present  7. Extrathyroidal extension: Not identified  8. Vascular/Lymphatic invasion: Not identified  9. Lymph nodes: N/A  10. TNM code: pT1, pNX, pMX  11. Comments: Examination of the thyroidectomy specimen  grossly demonstrates multiple nodules. Histologic evaluation  demonstrates numerous adenomatoid nodules. One nodule measuring  0.5 cm demonstrates features compatible with papillary thyroid  carcinoma. The surgical margins are negative. (MS:jy) 11/25/05   For her postsurgical hypothyroidism, pt is on  levothyroxine 200 mcg daily (dose increased 04/2020), taken: - in am - fasting - at least 30 min from b'fast - no calcium - no iron - no multivitamins - no PPIs - not on Biotin  Reviewed her latest TSH levels: Lab Results  Component Value Date   TSH 4.42 05/03/2020   TSH 1.50 10/22/2019   TSH 1.09 07/20/2019   TSH 1.32 04/01/2019   TSH 0.55 09/04/2018   TSH 0.90 12/26/2017   TSH 0.113 (L) 12/08/2017   TSH 0.22 (L) 09/19/2017   TSH 0.62 06/13/2017   TSH 1.43 01/22/2017   TSH 6.67 (H) 12/10/2016   TSH 1.12 01/29/2016   TSH 3.320 08/28/2014   TSH 5.470 (H) 07/10/2014   TSH 1.91 04/05/2011   Pt denies: - feeling nodules in neck - hoarseness - dysphagia - choking - SOB with lying down  She is not taking steroids or biotin.  She also has a history of COPD, OSA, fibromyalgia, depression, fatty liver-cirrhosis, DDD, h/o vb fx, PMR.  She is seen in pain clinic in [facility].  On Neurontin 300 mg prn 3 times a day, amitriptyline 100 mg at bedtime.  She does PT at home.  ROS: Constitutional: no weight gain, + weight loss, + fatigue, no subjective hyperthermia, no subjective hypothermia, + nocturia Eyes: no blurry vision, no xerophthalmia ENT: no sore throat, + see HPI, + tinnitus, no hypoacusis Cardiovascular: no CP, + SOB with exertion, no palpitations, no leg swelling Respiratory: no cough, + SOB, no wheezing Gastrointestinal: no N, no V, no D, + C, no acid reflux Musculoskeletal: + muscle, + joint aches Skin: no rash, no hair loss, + easy bruising, + itching Neurological: + tremors, no numbness or tingling/no dizziness/no HAs Psychiatric: + Depression, + anxiety  Past Medical History:  Diagnosis Date   AKI (acute kidney injury) ([place]) 01/2017   Anxiety    with panic attacks   Arthritis    "back; feet; hands; shoulders" (08/26/2014)   Asthma    Cervical cancer (HCC)    Chronic lower back pain    Chronic narcotic use    Chronic pain syndrome    PAIN  CLINIC AT [place]   Cirrhosis ([place])    Clostridium difficile infection 2017   COPD (chronic obstructive pulmonary disease) (HCC)    Daily headache    Depression    Diabetic neuropathy ([place]) 06/04/2017   DJD (degenerative joint disease)    Fatty liver disease, nonalcoholic    Fibromyalgia    Frequency of urination    HCAP (healthcare-associated pneumonia) 08/26/2014   History of TIA (transient ischemic attack) 11-01-2010   NO RESIDUAL   Hyperlipidemia    Hypertension    Hypothyroidism    IDDM (insulin dependent diabetes mellitus)    Insomnia    Lumbar stenosis    Memory difficulty 07/25/2016   Nocturia    OSA (obstructive sleep apnea)    NO CPAP SINCE WT  LOSS   Osteoarthritis    with severe disease in knee   Pneumonia "several times"   Polymyalgia rheumatica (HCC)    Scoliosis    Seasonal allergies    Thyroid cancer ([place])    Urgency of urination    Vaginal pain S/P SLING  FEB 2012   Past Surgical History:  Procedure Laterality Date   APPENDECTOMY  1982   BREAST EXCISIONAL BIOPSY Left 02/28/2005   Atypical Ductal Hyperplasia   CARDIAC CATHETERIZATION  09-04-2004   NORMAL CORONARY ANATOMY/ NORMAL LVF/ EF 60%   CARDIOVASCULAR STRESS TEST  12-27-2010  DR [place]   ABNORMAL NUCLEAR STUDY W/ /MILD INFERIOR ISCHEMIA/ EF 69%/  CT HEART ANGIOGRAM ;  NO ACUTE FINDINGS   CRYOABLATION  05/16/2003   w/LEEP FOR ABNORMAL PAP SMEAR   CYSTOSCOPY  05/18/2012   Procedure: CYSTOSCOPY;  Surgeon: Reece Packer, MD;  Location: [facility];  Service: Urology;  Laterality: N/A;  examination under anethesia   ESOPHAGOGASTRODUODENOSCOPY (EGD) WITH PROPOFOL N/A 09/03/2016   Procedure: ESOPHAGOGASTRODUODENOSCOPY (EGD) WITH PROPOFOL;  Surgeon: Doran Stabler, MD;  Location: [facility];  Service: Gastroenterology;  Laterality: N/A;   HYSTEROSCOPY WITH D & C  08-19-2007   PMB   KNEE ARTHROSCOPY W/ DEBRIDEMENT Left 03/29/2006   INTERNAL  DERANGEMENT/ SEVERE DJD/ MENISCUS TEARS   LAPAROSCOPIC CHOLECYSTECTOMY  06-10-2002   LAPAROSCOPIC GASTRIC BANDING  03/01/2006   TRUNCAL VAGOTOMY/ PLACEMENT OF VG BAND   REVISION TOTAL KNEE ARTHROPLASTY Left 08-29-2008; 05/2009   TONSILLECTOMY  1969   TOTAL KNEE ARTHROPLASTY Left 01-23-2007   SEVERE DJD   TOTAL THYROIDECTOMY  11-22-2005   BILATERAL THYROID NODULES-- PAPILLARY CARCINOMA (0.5CM)/ ADENOMATOID NODULES   TRANSTHORACIC ECHOCARDIOGRAM  12-27-2010   LVSF NORMAL / EF 41-66%/ GRADE I DIASTOLIC DYSFUNCTION/ MILD MITRAL REGURG. / MILDLY DILATED LEFT ATRIUM/ MILDY INCREASED SYSTOLIC PRESSURE OF PULMONARY ARTERIES   TRANSVAGINAL SUBURETERAL TAPE/ SLING  09-28-2010   MIXED URINARY INCONTINENCE   TUBAL LIGATION  1983   Social History   Socioeconomic History   Marital status: Married    Spouse name: Not on file   Number of children: 2   Years of education: 12   Highest education level: Not on file  Occupational History   Occupation: disabled    Fish farm manager: UNEMPLOYED  Tobacco Use   Smoking status: Former Smoker    Packs/day: 2.50    Years: 39.00    Pack years: 97.50    Types: Cigarettes    Quit date: 10/22/2010    Years since quitting: 9.7   Smokeless tobacco: Never Used  Vaping Use   Vaping Use: Never used  Substance and Sexual Activity   Alcohol use: No    Alcohol/week: 0.0 standard drinks   Drug use: No   Sexual activity: Not Currently  Other Topics Concern   Not on file  Social History Narrative   Lives at home w/ her husband    Right-handed   Caffeine: 1 cup of coffee per week + [employer]eps[employer]   Social Determinants of Health   Financial Resource Strain: High Risk   Difficulty of Paying Living Expenses: Hard  Food Insecurity:    Worried About Charity fundraiser in the Last Year: Not on file   [employer] of Food in the Last Year: Not on file  Transportation Needs:    Lack of Transportation (Medical): Not on file   Lack of Transportation  (Non-Medical): Not on file  Physical Activity:    Days of  Exercise per Week: Not on file   Minutes of Exercise per Session: Not on file  Stress:    Feeling of Stress : Not on file  Social Connections:    Frequency of Communication with Friends and Family: Not on file   Frequency of Social Gatherings with Friends and Family: Not on file   Attends Religious Services: Not on file   Active Member of Clubs or Organizations: Not on file   Attends Archivist Meetings: Not on file   Marital Status: Not on file  Intimate Partner Violence: Not At Risk   Fear of Current or Ex-Partner: No   Emotionally Abused: No   Physically Abused: No   Sexually Abused: No   Current Outpatient Medications on File Prior to Visit  Medication Sig Dispense Refill   acetaminophen (TYLENOL) 500 MG tablet Take 1,000 mg by mouth 2 (two) times daily.     albuterol (VENTOLIN HFA) 108 (90 Base) MCG/ACT inhaler TAKE 2 PUFFS BY MOUTH EVERY 6 HOURS AS NEEDED FOR WHEEZE OR SHORTNESS OF BREATH 18 g 11   amitriptyline (ELAVIL) 100 MG tablet Take 100 mg by mouth at bedtime.     amLODipine (NORVASC) 5 MG tablet Take 1 tablet (5 mg total) by mouth 2 (two) times daily. 180 tablet 1   aspirin EC 81 MG tablet Take 1 tablet (81 mg total) by mouth daily. 90 tablet 3   ezetimibe (ZETIA) 10 MG tablet Take 1 tablet (10 mg total) by mouth daily. 30 tablet 1   furosemide (LASIX) 40 MG tablet TAKE ONE TABLET BY MOUTH dAILY 30 tablet 5   gabapentin (NEURONTIN) 300 MG capsule Take 300 mg by mouth 3 (three) times daily.      hydrALAZINE (APRESOLINE) 100 MG tablet Take 1 tablet (100 mg total) by mouth 2 (two) times daily. 180 tablet 1   hydrOXYzine (ATARAX/VISTARIL) 25 MG tablet Take 1 tablet (25 mg total) by mouth in the morning and at bedtime. 180 tablet 1   insulin glargine (LANTUS) 100 UNIT/ML injection Inject 1.8 mLs (180 Units total) into the skin daily. (Patient taking differently: Inject 40 Units into the  skin 2 (two) times daily. ) 60 mL 11   Insulin Pen Needle (PEN NEEDLES) 31G X 8 MM MISC 1 Device by Does not apply route daily. 90 each 3   Insulin Syringe-Needle U-100 26G X 1/2" 1 ML MISC Use daily for insulin injection as directed 100 each 3   ipratropium-albuterol (DUONEB) 0.5-2.5 (3) MG/3ML SOLN Take 3 mLs by nebulization every 6 (six) hours as needed (Wheezing or dyspnea.). 360 mL 11   levothyroxine (SYNTHROID) 200 MCG tablet Take 1 tablet (200 mcg total) by mouth daily before breakfast. 90 tablet 1   meloxicam (MOBIC) 7.5 MG tablet Take 7.5 mg by mouth daily.     metoprolol tartrate (LOPRESSOR) 25 MG tablet Take 1 tablet (25 mg total) by mouth 2 (two) times daily. 180 tablet 3   morphine (MS CONTIN) 30 MG 12 hr tablet Take 30 mg by mouth 2 (two) times daily.     ondansetron (ZOFRAN-ODT) 4 MG disintegrating tablet Take 1 tablet (4 mg total) by mouth every 6 (six) hours as needed for nausea or vomiting. 30 tablet 3   PARoxetine (PAXIL) 40 MG tablet Take 1 tablet (40 mg total) by mouth daily. 90 tablet 1   potassium chloride SA (KLOR-CON M20) 20 MEQ tablet TAKE 2 TABLETS EVERY DAY AS NEEDED FOR CRAMPING 180 tablet 1   Semaglutide (  RYBELSUS) 7 MG TABS Take 7 mg by mouth daily before breakfast. 90 tablet 1   simvastatin (ZOCOR) 20 MG tablet Take 20 mg by mouth daily.     tiZANidine (ZANAFLEX) 2 MG tablet Take 2 mg by mouth as needed for muscle spasms.   2   No current facility-administered medications on file prior to visit.   Allergies  Allergen Reactions   Gabapentin Swelling    Swelling in legs Swelling in legs Swelling in legs   Losartan Other (See Comments)    Myalgias and muscle cramping Other reaction(s): Other (See Comments) Myalgias and muscle cramping Myalgias and muscle cramping Other reaction(s): Other (See Comments) Myalgias and muscle cramping   Aricept [Donepezil Hcl]     Nausea/vomiting, low BP   Oxycodone Itching   Sulfa Antibiotics Nausea Only  and Rash   Sulfonamide Derivatives Nausea Only   Family History  Problem Relation Age of Onset   Diabetes Mother    Heart disease Mother    Dementia Mother    Heart disease Father    High blood pressure Father    Colon cancer Maternal Uncle        x 2   Breast cancer Other        great aunts x 5    PE: BP 130/82    Pulse 83    Ht 5\' 9"  (1.753 m)    Wt 297 lb 9.6 oz (135 kg)    SpO2 93%    BMI 43.95 kg/m  Wt Readings from Last 3 Encounters:  07/05/20 297 lb 9.6 oz (135 kg)  05/31/20 (!) 315 lb (142.9 kg)  05/03/20 (!) 315 lb 3.2 oz (143 kg)   Constitutional: obese, in NAD Eyes: PERRLA, EOMI, no exophthalmos ENT: moist mucous membranes, no thyromegaly, no cervical lymphadenopathy Cardiovascular: RRR, No MRG Respiratory: CTA B Gastrointestinal: abdomen soft, NT, ND, BS+ Musculoskeletal: no deformities, strength intact in all 4 Skin: moist, warm, no rashes Neurological: + tremor with outstretched hands, DTR normal in all 4  ASSESSMENT: 1. DM2, insulin-dependent, uncontrolled, with complications  2.  History of thyroid cancer  3.  Postsurgical hypothyroidism  PLAN:  1. Patient with long-standing, uncontrolled diabetes, on long-acting insulin only, which became insufficient.  Latest HbA1c from 2 months ago was 10.4%, very high >> today: 8.1% (better). -At today's visit, we reviewed her blood sugars and it appears that they improved significantly after starting Rybelsus even though she was only using the lowest dose, 3 mg daily.  She stopped after she ran out of this approximately 2 weeks ago when her sugars increased to 200s in the morning.  She already applied for patient assistance and will obtain Rybelsus 7 mg soon.  I advised her to start this, but at the lower dose, of 3.5 mg daily for few days and then increase to the 7 mg dose.  She agrees with this plan.  She did tolerate very well with the low dose. -We also discussed about starting a low-dose Metformin, since  she does not mention any previous side effects with it.  She would prefer to take it all with dinner. -For now, we discussed about keeping the same dose of Basaglar, but I am sure that we will be able to decrease the dose after she starts the 2 oral medications above.  I advised her to get in touch with me if the sugars start to decrease too much. -We also discussed about improving her diet.  I made specific suggestions especially  by reducing fat in her diet, and she also agrees with referral to nutrition.  I strongly also advised her to stop sweet drinks. - I suggested to:  Patient Instructions  Please continue: - Basaglar 80 units at bedtime  Start: - Metformin 500 mg with dinner for 7 days, then increase to 1000 mg with dinner - Rybelsus 7 mg before b'fast  Please let me know if the sugars are consistently <80 or >200.  Stop sweet drinks.  Please schedule an appt with nutrition.  Please return in 1.5 months with your sugar log.   - Strongly advised her to start checking sugars at different times of the day - check 3x a day, rotating checks - discussed about CBG targets for treatment: 80-130 mg/dL before meals and <180 mg/dL after meals; target HbA1c <7%. - given sugar log and advised how to fill it and to bring it at next appt  - given foot care handout and explained the principles  - given instructions for hypoglycemia management "15-15 rule"  - advised for yearly eye exams  - Return to clinic in 1.5 mo with sugar log   2.  History of thyroid cancer -Per review of the chart, she had a papillary thyroid cancer focus in the right thyroid lobe, measuring 0.5 cm.  There was no vascular, lymphatic, or direct extension into thyroid tissue -She is most likely cured of her cancer, but would like to obtain a neck ultrasound at next visit  3.  Postsurgical hypothyroidism - latest thyroid labs reviewed with pt >> normal: Lab Results  Component Value Date   TSH 4.42 05/03/2020   - she  continues on Synthroid 200 mcg daily -dose increased 04/2020 - pt feels good on this dose. - we discussed about taking the thyroid hormone every day, with water, >30 minutes before breakfast, separated by >4 hours from acid reflux medications, calcium, iron, multivitamins. Pt. is taking it correctly.  - Total time spent for the visit: 40 min, in obtaining medical information from the patient and from the chart, reviewing her  previous labs,evaluations, and treatments, reviewing her symptoms, counseling her about her conditions (please see the discussed topics above), and developing a plan to further investigate and treat them; she had a number of questions which I addressed.  Philemon Kingdom, MD PhD [facility]

## 2020-07-05 NOTE — Patient Instructions (Addendum)
Please continue: - Basaglar 80 units at bedtime  Start: - Metformin 500 mg with dinner for 7 days, then increase to 1000 mg with dinner - Rybelsus 7 mg before b'fast  Please let me know if the sugars are consistently <80 or >200.  Stop sweet drinks.  Please schedule an appt with nutrition.  Please return in 1.5 months with your sugar log.   PATIENT INSTRUCTIONS FOR TYPE 2 DIABETES:  DIET AND EXERCISE Diet and exercise is an important part of diabetic treatment.  We recommended aerobic exercise in the form of brisk walking (working between 40-60% of maximal aerobic capacity, similar to brisk walking) for 150 minutes per week (such as 30 minutes five days per week) along with 3 times per week performing 'resistance' training (using various gauge rubber tubes with handles) 5-10 exercises involving the major muscle groups (upper body, lower body and core) performing 10-15 repetitions (or near fatigue) each exercise. Start at half the above goal but build slowly to reach the above goals. If limited by weight, joint pain, or disability, we recommend daily walking in a swimming pool with water up to waist to reduce pressure from joints while allow for adequate exercise.    BLOOD GLUCOSES Monitoring your blood glucoses is important for continued management of your diabetes. Please check your blood glucoses 2-4 times a day: fasting, before meals and at bedtime (you can rotate these measurements - e.g. one day check before the 3 meals, the next day check before 2 of the meals and before bedtime, etc.).   HYPOGLYCEMIA (low blood sugar) Hypoglycemia is usually a reaction to not eating, exercising, or taking too much insulin/ other diabetes drugs.  Symptoms include tremors, sweating, hunger, confusion, headache, etc. Treat IMMEDIATELY with 15 grams of Carbs: . 4 glucose tablets .  cup regular juice/soda . 2 tablespoons raisins . 4 teaspoons sugar . 1 tablespoon honey Recheck blood glucose in 15  mins and repeat above if still symptomatic/blood glucose <100.  RECOMMENDATIONS TO REDUCE YOUR RISK OF DIABETIC COMPLICATIONS: * Take your prescribed MEDICATION(S) * Follow a DIABETIC diet: Complex carbs, fiber rich foods, (monounsaturated and polyunsaturated) fats * AVOID saturated/trans fats, high fat foods, >2,300 mg salt per day. * EXERCISE at least 5 times a week for 30 minutes or preferably daily.  * DO NOT SMOKE OR DRINK more than 1 drink a day. * Check your FEET every day. Do not wear tightfitting shoes. Contact us if you develop an ulcer * See your EYE doctor once a year or more if needed * Get a FLU shot once a year * Get a PNEUMONIA vaccine once before and once after age 55 years  GOALS:  * Your Hemoglobin A1c of <7%  * fasting sugars need to be <130 * after meals sugars need to be <180 (2h after you start eating) * Your Systolic BP should be 160 or lower  * Your Diastolic BP should be 80 or lower  * Your HDL (Good Cholesterol) should be 40 or higher  * Your LDL (Bad Cholesterol) should be 100 or lower. * Your Triglycerides should be 150 or lower  * Your Urine microalbumin (kidney function) should be <30 * Your Body Mass Index should be 25 or lower    Please consider the following ways to cut down carbs and fat and increase fiber and micronutrients in your diet: - substitute whole grain for white bread or pasta - substitute brown rice for white rice - substitute 90-calorie flat bread pieces  for slices of bread when possible - substitute sweet potatoes or yams for white potatoes - substitute humus for margarine - substitute tofu for cheese when possible - substitute almond or rice milk for regular milk (would not drink soy milk daily due to concern for soy estrogen influence on breast cancer risk) - substitute dark chocolate for other sweets when possible - substitute water - can add lemon or orange slices for taste - for diet sodas (artificial sweeteners will trick your  body that you can eat sweets without getting calories and will lead you to overeating and weight gain in the long run) - do not skip breakfast or other meals (this will slow down the metabolism and will result in more weight gain over time)  - can try smoothies made from fruit and almond/rice milk in am instead of regular breakfast - can also try old-fashioned (not instant) oatmeal made with almond/rice milk in am - order the dressing on the side when eating salad at a restaurant (pour less than half of the dressing on the salad) - eat as little meat as possible - can try juicing, but should not forget that juicing will get rid of the fiber, so would alternate with eating raw veg./fruits or drinking smoothies - use as little oil as possible, even when using olive oil - can dress a salad with a mix of balsamic vinegar and lemon juice, for e.g. - use agave nectar, stevia sugar, or regular sugar rather than artificial sweateners - steam or broil/roast veggies  - snack on veggies/fruit/nuts (unsalted, preferably) when possible, rather than processed foods - reduce or eliminate aspartame in diet (it is in diet sodas, chewing gum, etc) Read the labels!  Try to read Dr. Janene Harvey book: "Program for Reversing Diabetes" for other ideas for healthy eating.

## 2020-07-06 DIAGNOSIS — E1140 Type 2 diabetes mellitus with diabetic neuropathy, unspecified: Secondary | ICD-10-CM | POA: Diagnosis not present

## 2020-07-06 DIAGNOSIS — G8929 Other chronic pain: Secondary | ICD-10-CM | POA: Diagnosis not present

## 2020-07-06 DIAGNOSIS — M545 Low back pain, unspecified: Secondary | ICD-10-CM | POA: Diagnosis not present

## 2020-07-06 DIAGNOSIS — I471 Supraventricular tachycardia: Secondary | ICD-10-CM | POA: Diagnosis not present

## 2020-07-06 DIAGNOSIS — I110 Hypertensive heart disease with heart failure: Secondary | ICD-10-CM | POA: Diagnosis not present

## 2020-07-06 DIAGNOSIS — J9610 Chronic respiratory failure, unspecified whether with hypoxia or hypercapnia: Secondary | ICD-10-CM | POA: Diagnosis not present

## 2020-07-06 DIAGNOSIS — J45909 Unspecified asthma, uncomplicated: Secondary | ICD-10-CM | POA: Diagnosis not present

## 2020-07-06 DIAGNOSIS — I509 Heart failure, unspecified: Secondary | ICD-10-CM | POA: Diagnosis not present

## 2020-07-06 DIAGNOSIS — E1159 Type 2 diabetes mellitus with other circulatory complications: Secondary | ICD-10-CM | POA: Diagnosis not present

## 2020-07-06 DIAGNOSIS — G4733 Obstructive sleep apnea (adult) (pediatric): Secondary | ICD-10-CM | POA: Diagnosis not present

## 2020-07-07 DIAGNOSIS — I110 Hypertensive heart disease with heart failure: Secondary | ICD-10-CM | POA: Diagnosis not present

## 2020-07-07 DIAGNOSIS — J9610 Chronic respiratory failure, unspecified whether with hypoxia or hypercapnia: Secondary | ICD-10-CM | POA: Diagnosis not present

## 2020-07-07 DIAGNOSIS — I509 Heart failure, unspecified: Secondary | ICD-10-CM | POA: Diagnosis not present

## 2020-07-07 DIAGNOSIS — E1159 Type 2 diabetes mellitus with other circulatory complications: Secondary | ICD-10-CM | POA: Diagnosis not present

## 2020-07-07 DIAGNOSIS — M545 Low back pain, unspecified: Secondary | ICD-10-CM | POA: Diagnosis not present

## 2020-07-07 DIAGNOSIS — G4733 Obstructive sleep apnea (adult) (pediatric): Secondary | ICD-10-CM | POA: Diagnosis not present

## 2020-07-07 DIAGNOSIS — J45909 Unspecified asthma, uncomplicated: Secondary | ICD-10-CM | POA: Diagnosis not present

## 2020-07-07 DIAGNOSIS — E1140 Type 2 diabetes mellitus with diabetic neuropathy, unspecified: Secondary | ICD-10-CM | POA: Diagnosis not present

## 2020-07-07 DIAGNOSIS — G8929 Other chronic pain: Secondary | ICD-10-CM | POA: Diagnosis not present

## 2020-07-07 DIAGNOSIS — I471 Supraventricular tachycardia: Secondary | ICD-10-CM | POA: Diagnosis not present

## 2020-07-10 DIAGNOSIS — J9610 Chronic respiratory failure, unspecified whether with hypoxia or hypercapnia: Secondary | ICD-10-CM | POA: Diagnosis not present

## 2020-07-10 DIAGNOSIS — E1159 Type 2 diabetes mellitus with other circulatory complications: Secondary | ICD-10-CM | POA: Diagnosis not present

## 2020-07-10 DIAGNOSIS — E039 Hypothyroidism, unspecified: Secondary | ICD-10-CM

## 2020-07-10 DIAGNOSIS — I110 Hypertensive heart disease with heart failure: Secondary | ICD-10-CM

## 2020-07-10 DIAGNOSIS — E1140 Type 2 diabetes mellitus with diabetic neuropathy, unspecified: Secondary | ICD-10-CM | POA: Diagnosis not present

## 2020-07-10 DIAGNOSIS — G8929 Other chronic pain: Secondary | ICD-10-CM | POA: Diagnosis not present

## 2020-07-10 DIAGNOSIS — M545 Low back pain, unspecified: Secondary | ICD-10-CM | POA: Diagnosis not present

## 2020-07-10 DIAGNOSIS — Z87891 Personal history of nicotine dependence: Secondary | ICD-10-CM

## 2020-07-10 DIAGNOSIS — E538 Deficiency of other specified B group vitamins: Secondary | ICD-10-CM

## 2020-07-10 DIAGNOSIS — Z7982 Long term (current) use of aspirin: Secondary | ICD-10-CM

## 2020-07-10 DIAGNOSIS — M797 Fibromyalgia: Secondary | ICD-10-CM

## 2020-07-10 DIAGNOSIS — J449 Chronic obstructive pulmonary disease, unspecified: Secondary | ICD-10-CM | POA: Diagnosis not present

## 2020-07-10 DIAGNOSIS — E785 Hyperlipidemia, unspecified: Secondary | ICD-10-CM

## 2020-07-10 DIAGNOSIS — Z7984 Long term (current) use of oral hypoglycemic drugs: Secondary | ICD-10-CM

## 2020-07-10 DIAGNOSIS — I471 Supraventricular tachycardia: Secondary | ICD-10-CM | POA: Diagnosis not present

## 2020-07-10 DIAGNOSIS — J45909 Unspecified asthma, uncomplicated: Secondary | ICD-10-CM

## 2020-07-10 DIAGNOSIS — Z794 Long term (current) use of insulin: Secondary | ICD-10-CM

## 2020-07-10 DIAGNOSIS — I509 Heart failure, unspecified: Secondary | ICD-10-CM

## 2020-07-10 DIAGNOSIS — G4733 Obstructive sleep apnea (adult) (pediatric): Secondary | ICD-10-CM

## 2020-07-10 DIAGNOSIS — F419 Anxiety disorder, unspecified: Secondary | ICD-10-CM

## 2020-07-10 DIAGNOSIS — D649 Anemia, unspecified: Secondary | ICD-10-CM

## 2020-07-10 DIAGNOSIS — F32A Depression, unspecified: Secondary | ICD-10-CM

## 2020-07-10 DIAGNOSIS — E669 Obesity, unspecified: Secondary | ICD-10-CM

## 2020-07-10 DIAGNOSIS — Z6841 Body Mass Index (BMI) 40.0 and over, adult: Secondary | ICD-10-CM

## 2020-07-10 DIAGNOSIS — Z9181 History of falling: Secondary | ICD-10-CM

## 2020-07-10 DIAGNOSIS — R32 Unspecified urinary incontinence: Secondary | ICD-10-CM

## 2020-07-10 DIAGNOSIS — Z9981 Dependence on supplemental oxygen: Secondary | ICD-10-CM

## 2020-07-12 ENCOUNTER — Telehealth: Payer: Self-pay | Admitting: Pharmacist

## 2020-07-12 DIAGNOSIS — E1159 Type 2 diabetes mellitus with other circulatory complications: Secondary | ICD-10-CM | POA: Diagnosis not present

## 2020-07-12 DIAGNOSIS — M545 Low back pain, unspecified: Secondary | ICD-10-CM | POA: Diagnosis not present

## 2020-07-12 DIAGNOSIS — G8929 Other chronic pain: Secondary | ICD-10-CM | POA: Diagnosis not present

## 2020-07-12 DIAGNOSIS — I110 Hypertensive heart disease with heart failure: Secondary | ICD-10-CM | POA: Diagnosis not present

## 2020-07-12 DIAGNOSIS — J9610 Chronic respiratory failure, unspecified whether with hypoxia or hypercapnia: Secondary | ICD-10-CM | POA: Diagnosis not present

## 2020-07-12 DIAGNOSIS — J449 Chronic obstructive pulmonary disease, unspecified: Secondary | ICD-10-CM | POA: Diagnosis not present

## 2020-07-12 DIAGNOSIS — J45909 Unspecified asthma, uncomplicated: Secondary | ICD-10-CM | POA: Diagnosis not present

## 2020-07-12 DIAGNOSIS — E1140 Type 2 diabetes mellitus with diabetic neuropathy, unspecified: Secondary | ICD-10-CM | POA: Diagnosis not present

## 2020-07-12 DIAGNOSIS — G4733 Obstructive sleep apnea (adult) (pediatric): Secondary | ICD-10-CM | POA: Diagnosis not present

## 2020-07-12 DIAGNOSIS — I471 Supraventricular tachycardia: Secondary | ICD-10-CM | POA: Diagnosis not present

## 2020-07-12 DIAGNOSIS — I509 Heart failure, unspecified: Secondary | ICD-10-CM | POA: Diagnosis not present

## 2020-07-12 NOTE — Progress Notes (Signed)
Chronic Care Management Pharmacy Assistant   Name: Lindzey Zent  MRN: [id number] DOB: 01-28-1956  Reason for Encounter: PAP Follow up Call   PCP : Binnie Rail, MD  Allergies:   Allergies  Allergen Reactions  . Gabapentin Swelling    Swelling in legs Swelling in legs Swelling in legs  . Losartan Other (See Comments)    Myalgias and muscle cramping Other reaction(s): Other (See Comments) Myalgias and muscle cramping Myalgias and muscle cramping Other reaction(s): Other (See Comments) Myalgias and muscle cramping  . Aricept [Donepezil Hcl]     Nausea/vomiting, low BP  . Oxycodone Itching  . Sulfa Antibiotics Nausea Only and Rash  . Sulfonamide Derivatives Nausea Only    Medications: Outpatient Encounter Medications as of 07/12/2020  Medication Sig Note  . acetaminophen (TYLENOL) 500 MG tablet Take 1,000 mg by mouth 2 (two) times daily.   Marland Kitchen albuterol (VENTOLIN HFA) 108 (90 Base) MCG/ACT inhaler TAKE 2 PUFFS BY MOUTH EVERY 6 HOURS AS NEEDED FOR WHEEZE OR SHORTNESS OF BREATH   . amitriptyline (ELAVIL) 100 MG tablet Take 100 mg by mouth at bedtime.   Marland Kitchen amLODipine (NORVASC) 5 MG tablet Take 1 tablet (5 mg total) by mouth 2 (two) times daily.   Marland Kitchen aspirin EC 81 MG tablet Take 1 tablet (81 mg total) by mouth daily.   Marland Kitchen ezetimibe (ZETIA) 10 MG tablet Take 1 tablet (10 mg total) by mouth daily. 06/05/2020: Holding due to leg cramps  . furosemide (LASIX) 40 MG tablet TAKE ONE TABLET BY MOUTH dAILY   . gabapentin (NEURONTIN) 300 MG capsule Take 300 mg by mouth 3 (three) times daily.    . hydrALAZINE (APRESOLINE) 100 MG tablet Take 1 tablet (100 mg total) by mouth 2 (two) times daily.   . hydrOXYzine (ATARAX/VISTARIL) 25 MG tablet Take 1 tablet (25 mg total) by mouth in the morning and at bedtime.   . insulin glargine (LANTUS) 100 UNIT/ML injection Inject 1.8 mLs (180 Units total) into the skin daily. (Patient taking differently: Inject 40 Units into the skin 2 (two) times  daily. ) 02/14/2020: Basaglar via [place] PAP  . Insulin Pen Needle (PEN NEEDLES) 31G X 8 MM MISC 1 Device by Does not apply route daily.   . Insulin Syringe-Needle U-100 26G X 1/2" 1 ML MISC Use daily for insulin injection as directed   . ipratropium-albuterol (DUONEB) 0.5-2.5 (3) MG/3ML SOLN Take 3 mLs by nebulization every 6 (six) hours as needed (Wheezing or dyspnea.).   Marland Kitchen levothyroxine (SYNTHROID) 200 MCG tablet Take 1 tablet (200 mcg total) by mouth daily before breakfast.   . meloxicam (MOBIC) 7.5 MG tablet Take 7.5 mg by mouth daily.   . metFORMIN (GLUCOPHAGE) 500 MG tablet Take 1 tablet (500 mg total) by mouth 2 (two) times daily with a meal.   . metoprolol tartrate (LOPRESSOR) 25 MG tablet Take 1 tablet (25 mg total) by mouth 2 (two) times daily.   Marland Kitchen morphine (MS CONTIN) 30 MG 12 hr tablet Take 30 mg by mouth 2 (two) times daily.   . ondansetron (ZOFRAN-ODT) 4 MG disintegrating tablet Take 1 tablet (4 mg total) by mouth every 6 (six) hours as needed for nausea or vomiting.   Marland Kitchen PARoxetine (PAXIL) 40 MG tablet Take 1 tablet (40 mg total) by mouth daily.   . potassium chloride SA (KLOR-CON M20) 20 MEQ tablet TAKE 2 TABLETS EVERY DAY AS NEEDED FOR CRAMPING   . Semaglutide (RYBELSUS) 7 MG TABS  Take 7 mg by mouth daily before breakfast.   . simvastatin (ZOCOR) 20 MG tablet Take 20 mg by mouth daily. (Patient not taking: Reported on 07/05/2020)   . tiZANidine (ZANAFLEX) 2 MG tablet Take 2 mg by mouth as needed for muscle spasms.     No facility-administered encounter medications on file as of 07/12/2020.    Current Diagnosis: Patient Active Problem List   Diagnosis Date Noted  . Uncontrolled type 2 diabetes mellitus with circulatory disorder, with long-term current use of insulin ([place]) 07/05/2020  . PSVT (paroxysmal supraventricular tachycardia) ([place]) 05/31/2020  . UTI (urinary tract infection) 03/01/2020  . Grief 02/03/2019  . Nausea 01/04/2019  . Reactive airway disease 10/21/2018    . Arthralgia 09/04/2018  . Narcotic overdose ([place]) 05/14/2018  . Thyroid cancer ([place]) 04/30/2018  . Obesity hypoventilation syndrome ([place]) 09/24/2017  . Palpitations 09/18/2017  . Diabetic neuropathy ([place]) 06/04/2017  . Vitamin B12 deficiency 03/05/2017  . Numbness and tingling in both hands 02/24/2017  . OSA (obstructive sleep apnea) 01/31/2017  . Liver cirrhosis secondary to NASH ([place]) 01/31/2017  . Hypothyroidism 01/31/2017  . Chronic narcotic use 01/31/2017  . Fibromyalgia 01/31/2017  . Hyperlipidemia 01/31/2017  . Gastroparesis   . Memory disorder 07/25/2016  . Lump in neck 01/29/2016  . Chronic diastolic (congestive) heart failure ([place]) 01/03/2016  . Fatty liver 01/03/2016  . Type 1 diabetes mellitus ([place]) 01/03/2016  . Recurrent Clostridium difficile diarrhea 01/03/2016  . Chronic respiratory failure ([place]) 11/10/2014  . Physical deconditioning 09/26/2014  . Dyspnea 09/26/2014  . Iron deficiency anemia, unspecified  04/05/2011  . Bariatric surgery status 04/05/2011  . Left arm pain   . UNSPECIFIED VITAMIN D DEFICIENCY 10/22/2007  . LOW BACK PAIN, CHRONIC 10/22/2007  . INSOMNIA 10/22/2007  . Obesity 10/14/2007  . Chronic pain syndrome 10/14/2007  . CARPAL TUNNEL SYNDROME 10/14/2007  . ALLERGIC RHINITIS CAUSE UNSPECIFIED 10/14/2007  . ARTHRITIS 10/14/2007  . Anxiety state 08/25/2007  . Depression 08/25/2007  . Essential hypertension 08/25/2007  . ASTHMA 08/25/2007  . CONSTIPATION 08/25/2007  . POLYMYALGIA RHEUMATICA 08/25/2007  . LEG EDEMA, BILATERAL 08/25/2007    Goals Addressed   None     Follow-Up:  Pharmacist Review         Called [employer] at [phone] to follow up on the patients application. Spoke with a representative she informed me that she is unable to find the patient information on-file and we will need to refax the paper work back to them and wait for a confirmation e-mail and wait a few days to call to verify that the application was  received.    Rosendo Gros, [facility]  Practice Team Manager/ CPA (Clinical Pharmacist Assistant) [phone]

## 2020-07-13 DIAGNOSIS — H35033 Hypertensive retinopathy, bilateral: Secondary | ICD-10-CM | POA: Diagnosis not present

## 2020-07-13 DIAGNOSIS — H43813 Vitreous degeneration, bilateral: Secondary | ICD-10-CM | POA: Diagnosis not present

## 2020-07-13 DIAGNOSIS — H353232 Exudative age-related macular degeneration, bilateral, with inactive choroidal neovascularization: Secondary | ICD-10-CM | POA: Diagnosis not present

## 2020-07-14 DIAGNOSIS — E1159 Type 2 diabetes mellitus with other circulatory complications: Secondary | ICD-10-CM | POA: Diagnosis not present

## 2020-07-14 DIAGNOSIS — G8929 Other chronic pain: Secondary | ICD-10-CM | POA: Diagnosis not present

## 2020-07-14 DIAGNOSIS — E1140 Type 2 diabetes mellitus with diabetic neuropathy, unspecified: Secondary | ICD-10-CM | POA: Diagnosis not present

## 2020-07-14 DIAGNOSIS — M545 Low back pain, unspecified: Secondary | ICD-10-CM | POA: Diagnosis not present

## 2020-07-14 DIAGNOSIS — J9610 Chronic respiratory failure, unspecified whether with hypoxia or hypercapnia: Secondary | ICD-10-CM | POA: Diagnosis not present

## 2020-07-14 DIAGNOSIS — I471 Supraventricular tachycardia: Secondary | ICD-10-CM | POA: Diagnosis not present

## 2020-07-14 DIAGNOSIS — I110 Hypertensive heart disease with heart failure: Secondary | ICD-10-CM | POA: Diagnosis not present

## 2020-07-14 DIAGNOSIS — G4733 Obstructive sleep apnea (adult) (pediatric): Secondary | ICD-10-CM | POA: Diagnosis not present

## 2020-07-14 DIAGNOSIS — J45909 Unspecified asthma, uncomplicated: Secondary | ICD-10-CM | POA: Diagnosis not present

## 2020-07-14 DIAGNOSIS — I509 Heart failure, unspecified: Secondary | ICD-10-CM | POA: Diagnosis not present

## 2020-07-17 DIAGNOSIS — E1140 Type 2 diabetes mellitus with diabetic neuropathy, unspecified: Secondary | ICD-10-CM | POA: Diagnosis not present

## 2020-07-17 DIAGNOSIS — I110 Hypertensive heart disease with heart failure: Secondary | ICD-10-CM | POA: Diagnosis not present

## 2020-07-17 DIAGNOSIS — I471 Supraventricular tachycardia: Secondary | ICD-10-CM | POA: Diagnosis not present

## 2020-07-17 DIAGNOSIS — G4733 Obstructive sleep apnea (adult) (pediatric): Secondary | ICD-10-CM | POA: Diagnosis not present

## 2020-07-17 DIAGNOSIS — M545 Low back pain, unspecified: Secondary | ICD-10-CM | POA: Diagnosis not present

## 2020-07-17 DIAGNOSIS — I509 Heart failure, unspecified: Secondary | ICD-10-CM | POA: Diagnosis not present

## 2020-07-17 DIAGNOSIS — G8929 Other chronic pain: Secondary | ICD-10-CM | POA: Diagnosis not present

## 2020-07-17 DIAGNOSIS — E1159 Type 2 diabetes mellitus with other circulatory complications: Secondary | ICD-10-CM | POA: Diagnosis not present

## 2020-07-17 DIAGNOSIS — J9610 Chronic respiratory failure, unspecified whether with hypoxia or hypercapnia: Secondary | ICD-10-CM | POA: Diagnosis not present

## 2020-07-17 DIAGNOSIS — J45909 Unspecified asthma, uncomplicated: Secondary | ICD-10-CM | POA: Diagnosis not present

## 2020-07-19 DIAGNOSIS — J9610 Chronic respiratory failure, unspecified whether with hypoxia or hypercapnia: Secondary | ICD-10-CM | POA: Diagnosis not present

## 2020-07-19 DIAGNOSIS — E1159 Type 2 diabetes mellitus with other circulatory complications: Secondary | ICD-10-CM | POA: Diagnosis not present

## 2020-07-19 DIAGNOSIS — I471 Supraventricular tachycardia: Secondary | ICD-10-CM | POA: Diagnosis not present

## 2020-07-19 DIAGNOSIS — I509 Heart failure, unspecified: Secondary | ICD-10-CM | POA: Diagnosis not present

## 2020-07-19 DIAGNOSIS — E1140 Type 2 diabetes mellitus with diabetic neuropathy, unspecified: Secondary | ICD-10-CM | POA: Diagnosis not present

## 2020-07-19 DIAGNOSIS — G8929 Other chronic pain: Secondary | ICD-10-CM | POA: Diagnosis not present

## 2020-07-19 DIAGNOSIS — G4733 Obstructive sleep apnea (adult) (pediatric): Secondary | ICD-10-CM | POA: Diagnosis not present

## 2020-07-19 DIAGNOSIS — M545 Low back pain, unspecified: Secondary | ICD-10-CM | POA: Diagnosis not present

## 2020-07-19 DIAGNOSIS — J45909 Unspecified asthma, uncomplicated: Secondary | ICD-10-CM | POA: Diagnosis not present

## 2020-07-19 DIAGNOSIS — I110 Hypertensive heart disease with heart failure: Secondary | ICD-10-CM | POA: Diagnosis not present

## 2020-07-21 DIAGNOSIS — I110 Hypertensive heart disease with heart failure: Secondary | ICD-10-CM | POA: Diagnosis not present

## 2020-07-21 DIAGNOSIS — J9610 Chronic respiratory failure, unspecified whether with hypoxia or hypercapnia: Secondary | ICD-10-CM | POA: Diagnosis not present

## 2020-07-21 DIAGNOSIS — G8929 Other chronic pain: Secondary | ICD-10-CM | POA: Diagnosis not present

## 2020-07-21 DIAGNOSIS — J45909 Unspecified asthma, uncomplicated: Secondary | ICD-10-CM | POA: Diagnosis not present

## 2020-07-21 DIAGNOSIS — I509 Heart failure, unspecified: Secondary | ICD-10-CM | POA: Diagnosis not present

## 2020-07-21 DIAGNOSIS — E1159 Type 2 diabetes mellitus with other circulatory complications: Secondary | ICD-10-CM | POA: Diagnosis not present

## 2020-07-21 DIAGNOSIS — E1140 Type 2 diabetes mellitus with diabetic neuropathy, unspecified: Secondary | ICD-10-CM | POA: Diagnosis not present

## 2020-07-21 DIAGNOSIS — G4733 Obstructive sleep apnea (adult) (pediatric): Secondary | ICD-10-CM | POA: Diagnosis not present

## 2020-07-21 DIAGNOSIS — M545 Low back pain, unspecified: Secondary | ICD-10-CM | POA: Diagnosis not present

## 2020-07-21 DIAGNOSIS — I471 Supraventricular tachycardia: Secondary | ICD-10-CM | POA: Diagnosis not present

## 2020-07-25 ENCOUNTER — Ambulatory Visit: Payer: Medicare HMO | Admitting: Nutrition

## 2020-07-26 ENCOUNTER — Telehealth: Payer: Self-pay | Admitting: Internal Medicine

## 2020-07-26 DIAGNOSIS — M545 Low back pain, unspecified: Secondary | ICD-10-CM | POA: Diagnosis not present

## 2020-07-26 DIAGNOSIS — J45909 Unspecified asthma, uncomplicated: Secondary | ICD-10-CM | POA: Diagnosis not present

## 2020-07-26 DIAGNOSIS — G4733 Obstructive sleep apnea (adult) (pediatric): Secondary | ICD-10-CM | POA: Diagnosis not present

## 2020-07-26 DIAGNOSIS — I471 Supraventricular tachycardia: Secondary | ICD-10-CM | POA: Diagnosis not present

## 2020-07-26 DIAGNOSIS — I509 Heart failure, unspecified: Secondary | ICD-10-CM | POA: Diagnosis not present

## 2020-07-26 DIAGNOSIS — G8929 Other chronic pain: Secondary | ICD-10-CM | POA: Diagnosis not present

## 2020-07-26 DIAGNOSIS — E1159 Type 2 diabetes mellitus with other circulatory complications: Secondary | ICD-10-CM | POA: Diagnosis not present

## 2020-07-26 DIAGNOSIS — J9610 Chronic respiratory failure, unspecified whether with hypoxia or hypercapnia: Secondary | ICD-10-CM | POA: Diagnosis not present

## 2020-07-26 DIAGNOSIS — E1140 Type 2 diabetes mellitus with diabetic neuropathy, unspecified: Secondary | ICD-10-CM | POA: Diagnosis not present

## 2020-07-26 DIAGNOSIS — I110 Hypertensive heart disease with heart failure: Secondary | ICD-10-CM | POA: Diagnosis not present

## 2020-07-26 NOTE — Telephone Encounter (Signed)
Should monitor heart rate at home.  If heart rate continues to remain low and if she continues to have dizziness she needs to follow-up with me or cardiology.

## 2020-07-26 NOTE — Telephone Encounter (Signed)
    Mallory from [facility] calling to report patient has resting HR 50--60 and with activity her HR is in the 60's. Some dizziness when standing No other symptoms  Phone [phone]

## 2020-07-26 NOTE — Telephone Encounter (Signed)
Returned Mallory's call she was no longer at the patient's home and ask that I directly call the patient.   Spoke with patient husbands he states she seems to be fine right now but will give [place] a call if numbers drop or if she has a dizzy spell again.

## 2020-07-27 ENCOUNTER — Telehealth: Payer: Self-pay | Admitting: Pharmacist

## 2020-07-27 NOTE — Progress Notes (Addendum)
Chronic Care Management Pharmacy Assistant   Name: Mary Acosta  MRN: [id number] DOB: [birth year]  Reason for Encounter: Medication Review  Patient Questions:  1.  Have you seen any other providers since your last visit? Yes, patient last seen Dr. Benjiman Acosta on 07/05/20  2.  Any changes in your medicines or health? Yes, Dr. Cruzita Acosta added Metformin 500 mg    PCP : Mary Rail, MD  Allergies:   Allergies  Allergen Reactions   Gabapentin Swelling    Swelling in legs Swelling in legs Swelling in legs   Losartan Other (See Comments)    Myalgias and muscle cramping Other reaction(s): Other (See Comments) Myalgias and muscle cramping Myalgias and muscle cramping Other reaction(s): Other (See Comments) Myalgias and muscle cramping   Aricept [Donepezil Hcl]     Nausea/vomiting, low BP   Oxycodone Itching   Sulfa Antibiotics Nausea Only and Rash   Sulfonamide Derivatives Nausea Only    Medications: Outpatient Encounter Medications as of 07/27/2020  Medication Sig Note   acetaminophen (TYLENOL) 500 MG tablet Take 1,000 mg by mouth 2 (two) times daily.    albuterol (VENTOLIN HFA) 108 (90 Base) MCG/ACT inhaler TAKE 2 PUFFS BY MOUTH EVERY 6 HOURS AS NEEDED FOR WHEEZE OR SHORTNESS OF BREATH    amitriptyline (ELAVIL) 100 MG tablet Take 100 mg by mouth at bedtime.    amLODipine (NORVASC) 5 MG tablet Take 1 tablet (5 mg total) by mouth 2 (two) times daily.    aspirin EC 81 MG tablet Take 1 tablet (81 mg total) by mouth daily.    ezetimibe (ZETIA) 10 MG tablet Take 1 tablet (10 mg total) by mouth daily. 06/05/2020: Holding due to leg cramps   furosemide (LASIX) 40 MG tablet TAKE ONE TABLET BY MOUTH dAILY    gabapentin (NEURONTIN) 300 MG capsule Take 300 mg by mouth 3 (three) times daily.     hydrALAZINE (APRESOLINE) 100 MG tablet Take 1 tablet (100 mg total) by mouth 2 (two) times daily.    hydrOXYzine (ATARAX/VISTARIL) 25 MG tablet Take 1 tablet (25 mg total) by  mouth in the morning and at bedtime.    insulin glargine (LANTUS) 100 UNIT/ML injection Inject 1.8 mLs (180 Units total) into the skin daily. (Patient taking differently: Inject 40 Units into the skin 2 (two) times daily. ) 02/14/2020: Basaglar via [employer] PAP   Insulin Pen Needle (PEN NEEDLES) 31G X 8 MM MISC 1 Device by Does not apply route daily.    Insulin Syringe-Needle U-100 26G X 1/2" 1 ML MISC Use daily for insulin injection as directed    ipratropium-albuterol (DUONEB) 0.5-2.5 (3) MG/3ML SOLN Take 3 mLs by nebulization every 6 (six) hours as needed (Wheezing or dyspnea.).    levothyroxine (SYNTHROID) 200 MCG tablet Take 1 tablet (200 mcg total) by mouth daily before breakfast.    meloxicam (MOBIC) 7.5 MG tablet Take 7.5 mg by mouth daily.    metFORMIN (GLUCOPHAGE) 500 MG tablet Take 1 tablet (500 mg total) by mouth 2 (two) times daily with a meal.    metoprolol tartrate (LOPRESSOR) 25 MG tablet Take 1 tablet (25 mg total) by mouth 2 (two) times daily.    morphine (MS CONTIN) 30 MG 12 hr tablet Take 30 mg by mouth 2 (two) times daily.    ondansetron (ZOFRAN-ODT) 4 MG disintegrating tablet Take 1 tablet (4 mg total) by mouth every 6 (six) hours as needed for nausea or vomiting.    PARoxetine (  PAXIL) 40 MG tablet Take 1 tablet (40 mg total) by mouth daily.    potassium chloride SA (KLOR-CON M20) 20 MEQ tablet TAKE 2 TABLETS EVERY DAY AS NEEDED FOR CRAMPING    Semaglutide (RYBELSUS) 7 MG TABS Take 7 mg by mouth daily before breakfast.    simvastatin (ZOCOR) 20 MG tablet Take 20 mg by mouth daily. (Patient not taking: Reported on 07/05/2020)    tiZANidine (ZANAFLEX) 2 MG tablet Take 2 mg by mouth as needed for muscle spasms.     No facility-administered encounter medications on file as of 07/27/2020.    Current Diagnosis: Patient Active Problem List   Diagnosis Date Noted   Uncontrolled type 2 diabetes mellitus with circulatory disorder, with long-term current use of insulin ([place])  07/05/2020   PSVT (paroxysmal supraventricular tachycardia) (Mary Acosta) 05/31/2020   UTI (urinary tract infection) 03/01/2020   Grief 02/03/2019   Nausea 01/04/2019   Reactive airway disease 10/21/2018   Arthralgia 09/04/2018   Narcotic overdose (Mary Acosta) 05/14/2018   Thyroid cancer (Mary Acosta) 04/30/2018   Obesity hypoventilation syndrome (Mary Acosta) 09/24/2017   Palpitations 09/18/2017   Diabetic neuropathy (Mary Acosta) 06/04/2017   Vitamin B12 deficiency 03/05/2017   Numbness and tingling in both hands 02/24/2017   OSA (obstructive sleep apnea) 01/31/2017   Liver cirrhosis secondary to NASH (Mary Acosta) 01/31/2017   Hypothyroidism 01/31/2017   Chronic narcotic use 01/31/2017   Fibromyalgia 01/31/2017   Hyperlipidemia 01/31/2017   Gastroparesis    Memory disorder 07/25/2016   Lump in neck 01/29/2016   Chronic diastolic (congestive) heart failure (Mary Acosta) 01/03/2016   Fatty liver 01/03/2016   Type 1 diabetes mellitus (Mary Acosta) 01/03/2016   Recurrent Clostridium difficile diarrhea 01/03/2016   Chronic respiratory failure ([place]) 11/10/2014   Physical deconditioning 09/26/2014   Dyspnea 09/26/2014   Iron deficiency anemia, unspecified  04/05/2011   Bariatric surgery status 04/05/2011   Left arm pain    UNSPECIFIED VITAMIN D DEFICIENCY 10/22/2007   LOW BACK PAIN, CHRONIC 10/22/2007   INSOMNIA 10/22/2007   Obesity 10/14/2007   Chronic pain syndrome 10/14/2007   CARPAL TUNNEL SYNDROME 10/14/2007   ALLERGIC RHINITIS CAUSE UNSPECIFIED 10/14/2007   ARTHRITIS 10/14/2007   Anxiety state 08/25/2007   Depression 08/25/2007   Essential hypertension 08/25/2007   ASTHMA 08/25/2007   CONSTIPATION 08/25/2007   POLYMYALGIA RHEUMATICA 08/25/2007   LEG EDEMA, BILATERAL 08/25/2007    Goals Addressed   None     Follow-Up:  Coordination of Enhanced Pharmacy Services   Reviewed chart for medication changes ahead of medication coordination call.  No OVs, Consults, or hospital visits since last care coordination  call/Pharmacist visit. (If appropriate, list visit date, provider name)  No medication changes indicated OR if recent visit, treatment plan here.  BP Readings from Last 3 Encounters:  07/05/20 130/82  05/03/20 (!) 146/84  04/13/20 (!) 142/84    Lab Results  Component Value Date   HGBA1C 8.1 (A) 07/05/2020     Patient obtains medications through Adherence Packaging  30 Days   Last adherence delivery included:  Metoprolol tartrate 25 mg 1 tab breakfast and 1 tab bedtime Simvastatin 20 mg 1 tab bedtime Amlodipine 5 mg breakfast and 1 tab bedtime Hydralazine 100 mg 1 tab breakfast and 1 tab bedtime Amitriptyline 100 1 tab bedtime  Hydroxyzine Hcl 25 mg 1 tab breakfast and 1 tab bedtime Levothyroxine 175 mcg 1 tab daily breakfast Paroxetine 40 mg 1 tab bedtime Gabapentin 300 mg 1 cap 3x daily as needed (vial) Furosemide 40 mg 1 tab daily  Trazodone 100 mg 1 tab bedtime  Tizanidine 2 mg 1 tab every 8 hours as needed (vial)   Patient is due for next adherence delivery on: 08/01/20. Called patient and reviewed medications and coordinated delivery.  This delivery to include: Metoprolol tartrate 25 mg 1 tab breakfast and 1 tab bedtime Simvastatin 20 mg 1 tab bedtime Amlodipine 5 mg breakfast and 1 tab bedtime Hydralazine 100 mg 1 tab breakfast and 1 tab bedtime Amitriptyline 100 1 tab bedtime  Hydroxyzine Hcl 25 mg 1 tab breakfast and 1 tab bedtime Levothyroxine 200 mcg 1 tab daily breakfast Paroxetine 40 mg 1 tab bedtime Gabapentin 300 mg 1 cap 3x daily as needed (vial) Furosemide 40 mg 1 tab daily Trazodone 100 mg 1 tab bedtime  Tizanidine 2 mg 1 tab every 8 hours as needed (vial) Morphine sulf Er 30 mg; take one tab bid (VIAL)  Morphine sulf ER 15 mg: 1 tab daily (VIAL) - **due 12/3**  Bd insulin syringe ultra fine 1 MI 30 Gauge x1/2, use as directed Bd uf short pen needle 63mmx31g, use as directed   Patient declined the following medications  Potassium due to patient has  an abundant supply on hand  Patient needs refills for Trazodone, ondansetron and Gabapentin.  Confirmed delivery date of 08/01/2020, advised patient that pharmacy will contact them the morning of delivery.  Wendy Poet, Clinical Pharmacist Assistant [facility]

## 2020-07-28 ENCOUNTER — Telehealth: Payer: Self-pay | Admitting: Pharmacist

## 2020-07-28 ENCOUNTER — Other Ambulatory Visit: Payer: Self-pay

## 2020-07-28 MED ORDER — LEVOTHYROXINE SODIUM 200 MCG PO TABS
200.0000 ug | ORAL_TABLET | Freq: Every day | ORAL | 1 refills | Status: DC
Start: 2020-07-28 — End: 2021-01-02

## 2020-07-28 NOTE — Telephone Encounter (Signed)
-----   Message from [place], [facility] sent at 07/27/2020  5:10 PM EST ----- Regarding: call pt back Please call her back about pill packs delivery. The plan is to deliver morphine 15 mg by itself on Friday 12/3 and pill packs 12/7 with other morphine 30 mg.

## 2020-07-28 NOTE — Progress Notes (Addendum)
Called patient to let her know that she would be getting only her Morphine 15 mg today, and on 08/01/20 she will be getting the rest of her pill packs with the Morphine 30 mg. Patient verbalized understanding.

## 2020-08-02 ENCOUNTER — Telehealth: Payer: Self-pay | Admitting: Pharmacist

## 2020-08-02 ENCOUNTER — Telehealth: Payer: Self-pay | Admitting: Gastroenterology

## 2020-08-02 ENCOUNTER — Other Ambulatory Visit: Payer: Self-pay

## 2020-08-02 DIAGNOSIS — R110 Nausea: Secondary | ICD-10-CM

## 2020-08-02 MED ORDER — ONDANSETRON 4 MG PO TBDP: 4.0000 mg | ORAL_TABLET | Freq: Four times a day (QID) | ORAL | 3 refills | Status: DC | PRN

## 2020-08-02 NOTE — Telephone Encounter (Signed)
Ok to refill patient request for Zofran

## 2020-08-02 NOTE — Progress Notes (Addendum)
Called Dr. Havery Moros office to request a new Rx Order for Zofran to [facility]. The previous requests were being sent to the [facility] department. I was able to get correct office number in order to to get the message to he provider.

## 2020-08-03 DIAGNOSIS — G8929 Other chronic pain: Secondary | ICD-10-CM | POA: Diagnosis not present

## 2020-08-03 DIAGNOSIS — M545 Low back pain, unspecified: Secondary | ICD-10-CM | POA: Diagnosis not present

## 2020-08-03 DIAGNOSIS — I471 Supraventricular tachycardia: Secondary | ICD-10-CM | POA: Diagnosis not present

## 2020-08-03 DIAGNOSIS — J45909 Unspecified asthma, uncomplicated: Secondary | ICD-10-CM | POA: Diagnosis not present

## 2020-08-03 DIAGNOSIS — R69 Illness, unspecified: Secondary | ICD-10-CM | POA: Diagnosis not present

## 2020-08-03 DIAGNOSIS — E1140 Type 2 diabetes mellitus with diabetic neuropathy, unspecified: Secondary | ICD-10-CM | POA: Diagnosis not present

## 2020-08-03 DIAGNOSIS — E1159 Type 2 diabetes mellitus with other circulatory complications: Secondary | ICD-10-CM | POA: Diagnosis not present

## 2020-08-03 DIAGNOSIS — J9610 Chronic respiratory failure, unspecified whether with hypoxia or hypercapnia: Secondary | ICD-10-CM | POA: Diagnosis not present

## 2020-08-03 DIAGNOSIS — I509 Heart failure, unspecified: Secondary | ICD-10-CM | POA: Diagnosis not present

## 2020-08-03 DIAGNOSIS — G4733 Obstructive sleep apnea (adult) (pediatric): Secondary | ICD-10-CM | POA: Diagnosis not present

## 2020-08-03 DIAGNOSIS — F419 Anxiety disorder, unspecified: Secondary | ICD-10-CM | POA: Diagnosis not present

## 2020-08-03 DIAGNOSIS — I110 Hypertensive heart disease with heart failure: Secondary | ICD-10-CM | POA: Diagnosis not present

## 2020-08-03 NOTE — Telephone Encounter (Signed)
Received call from patient to discuss medications.  Patient confirmed she received all medications yesterday, except Trazodone which needs refill from psychiatrist. She has virtual visit with prescriber today and will get medication refilled at that time.  Patient also asked for update on [employer] PAP. Per discussion with [employer] last week, they have received the application but they are behind on processing application, they had asked to call back in 3-5 business days. Pharmacy team will contact [place] again to check status this week.  Patient voices understanding of the above, denies further questions.

## 2020-08-03 NOTE — Telephone Encounter (Signed)
Yes, thanks  - HD

## 2020-08-04 ENCOUNTER — Ambulatory Visit (INDEPENDENT_AMBULATORY_CARE_PROVIDER_SITE_OTHER): Payer: Medicare HMO | Admitting: Internal Medicine

## 2020-08-04 ENCOUNTER — Encounter: Payer: Self-pay | Admitting: Internal Medicine

## 2020-08-04 ENCOUNTER — Other Ambulatory Visit: Payer: Self-pay

## 2020-08-04 VITALS — BP 120/70 | HR 73 | Ht 69.0 in | Wt 287.2 lb

## 2020-08-04 DIAGNOSIS — Z85850 Personal history of malignant neoplasm of thyroid: Secondary | ICD-10-CM

## 2020-08-04 DIAGNOSIS — E1159 Type 2 diabetes mellitus with other circulatory complications: Secondary | ICD-10-CM | POA: Diagnosis not present

## 2020-08-04 DIAGNOSIS — E890 Postprocedural hypothyroidism: Secondary | ICD-10-CM | POA: Diagnosis not present

## 2020-08-04 DIAGNOSIS — J449 Chronic obstructive pulmonary disease, unspecified: Secondary | ICD-10-CM | POA: Diagnosis not present

## 2020-08-04 DIAGNOSIS — Z794 Long term (current) use of insulin: Secondary | ICD-10-CM | POA: Diagnosis not present

## 2020-08-04 DIAGNOSIS — E1165 Type 2 diabetes mellitus with hyperglycemia: Secondary | ICD-10-CM

## 2020-08-04 DIAGNOSIS — IMO0002 Reserved for concepts with insufficient information to code with codable children: Secondary | ICD-10-CM

## 2020-08-04 LAB — T4, FREE: Free T4: 1.59 ng/dL (ref 0.60–1.60)

## 2020-08-04 LAB — TSH: TSH: 0.14 u[IU]/mL — ABNORMAL LOW (ref 0.35–4.50)

## 2020-08-04 MED ORDER — OZEMPIC (0.25 OR 0.5 MG/DOSE) 2 MG/1.5ML ~~LOC~~ SOPN: 0.5000 mg | PEN_INJECTOR | SUBCUTANEOUS | 3 refills | Status: DC

## 2020-08-04 NOTE — Progress Notes (Addendum)
Patient ID: Mary Acosta, female   DOB: 06-12-1956, 64 y.o.   MRN: [id number]   This visit occurred during the SARS-CoV-2 public health emergency.  Safety protocols were in place, including screening questions prior to the visit, additional usage of staff PPE, and extensive cleaning of exam room while observing appropriate contact time as indicated for disinfecting solutions.   HPI: Mary Acosta is a 64 y.o.-year-old female, initially referred by her PCP, Dr. Quay Burow, returning for follow-up for DM2, dx in 2000, insulin-dependent since 2011, uncontrolled, with long-term complications (peripheral neuropathy, gastroparesis, cerebrovascular disease with history of TIA 2012, DKA 2015) and also history of thyroid cancer and postsurgical hypothyroidism.  She previously saw my colleague, Dr. Loanne Drilling, up to 07/2019.  First visit with me was a month ago.  DM2: Reviewed HbA1c levels: Lab Results  Component Value Date   HGBA1C 8.1 (A) 07/05/2020   HGBA1C 10.4 (H) 05/03/2020   HGBA1C 11.0 (H) 10/22/2019   HGBA1C 9.4 (H) 07/20/2019   HGBA1C 8.8 (H) 04/01/2019   HGBA1C 10.5 (A) 10/08/2018   HGBA1C 10.8 (H) 09/04/2018   HGBA1C 9.9 (H) 06/01/2018   HGBA1C 9.8 (A) 04/30/2018   HGBA1C 9.9 (A) 02/24/2018   Pt is on a regimen of: - Toujeo 180 units daily >> Basaglar 55-80 units at bedtime -Lilly Cares - Metformin 1000 mg with dinner Prev. On Rybelsus 3 mg before breakfast-see patient assistance pgm >> at last OV I recommended 7 mg -she did not receive this yet She was on N and R insulin in the past. She was on metformin in the past >> did not work.  Pt checks her sugars 2-3 times a day: - am: 140-150 on Rybelsus 3 mg; up to 200 off Rybelsus >> 75, 200-225 - 2h after b'fast: n/c - before lunch: n/c - 2h after lunch: n/c - before dinner: 130-150 on Rybelsus 3 mg; up to 250 off Rybelsus >> 200-225 - 2h after dinner: n/c - bedtime: n/c - nighttime: n/c Lowest sugar was 130 >> 75; she has  hypoglycemia awareness in the 70s. Highest sugar was 600 >> 250.  Glucometer: AccuChek, Prodigy  Pt's meals are: - Breakfast: bacon or sausage and eggs, cereal  - stopped - Lunch: salad; hot dog - Dinner: meat + veggies - Snacks: not so much anymore Stopped cool Aid.  She is status post gastric banding with vagotomy in 2008.  She lost 148 pounds afterwards but gained more than 100 pounds back.  No h/o pancreatitis or FH of MTC.  No CKD, last BUN/creatinine:  Lab Results  Component Value Date   BUN 14 05/03/2020   BUN 18 10/22/2019   CREATININE 0.97 05/03/2020   CREATININE 0.98 10/22/2019  Not on ACE inhibitor/ARB.  + HL; last set of lipids: Lab Results  Component Value Date   CHOL 126 05/03/2020   HDL 58 05/03/2020   LDLCALC 55 05/03/2020   TRIG 54 05/03/2020   CHOLHDL 2.2 05/03/2020  On Zocor 20, Zetia 10.  - last eye exam was in 04/2020:?  DR. Marguerite Olea IO injections for macular degeneration.  -+ Pain, numbness, tingling in her feet.  On ASA 81.  Pt has FH of DM in mother.  Thyroid cancer:  Reviewed her thyroid cancer history: She is status post total thyroidectomy - [place] Sx 2007. 11/22/2005: THYROID, THYROIDECTOMY:  - PAPILLARY THYROID CARCINOMA (0.5 CM).  - ADENOMATOID NODULES.   COMMENT  ONCOLOGY TABLE-THYROID   1. Maximum tumor size (cm): 0.5 cm  2. Tumor location: Right mid lobe  3. Multifocality: Not identified  4. Histology: Papillary thyroid carcinoma  5. Margins: Negative  6. Capsular invasion: Present  7. Extrathyroidal extension: Not identified  8. Vascular/Lymphatic invasion: Not identified  9. Lymph nodes: N/A  10. TNM code: pT1, pNX, pMX  11. Comments: Examination of the thyroidectomy specimen  grossly demonstrates multiple nodules. Histologic evaluation  demonstrates numerous adenomatoid nodules. One nodule measuring  0.5 cm demonstrates features compatible with papillary thyroid  carcinoma. The surgical  margins are negative. (MS:jy) 11/25/05   Postsurgical hypothyroidism:  Pt is on levothyroxine 200 mcg daily (dose increased 04/2020), taken: - in am - fasting - at least 30 min from b'fast - no calcium - no iron - no multivitamins - no PPIs - not on Biotin  Reviewed her TFTs: Lab Results  Component Value Date   TSH 4.42 05/03/2020   TSH 1.50 10/22/2019   TSH 1.09 07/20/2019   TSH 1.32 04/01/2019   TSH 0.55 09/04/2018   TSH 0.90 12/26/2017   TSH 0.113 (L) 12/08/2017   TSH 0.22 (L) 09/19/2017   TSH 0.62 06/13/2017   TSH 1.43 01/22/2017   TSH 6.67 (H) 12/10/2016   TSH 1.12 01/29/2016   TSH 3.320 08/28/2014   TSH 5.470 (H) 07/10/2014   TSH 1.91 04/05/2011   Pt denies: - feeling nodules in neck - hoarseness - dysphagia - choking - SOB with lying down  She does not take steroids or biotin.  She also has a history of COPD, OSA, fibromyalgia, depression, fatty liver-cirrhosis, DDD, h/o vb fx, PMR.  She is seen in pain clinic in [facility].  She is on Neurontin and amitriptyline.  She does PT at home.  ROS: Constitutional: no weight gain/+ weight loss, + fatigue, no subjective hyperthermia, no subjective hypothermia Eyes: no blurry vision, no xerophthalmia ENT: no sore throat, + see HPI Cardiovascular: no CP/+ SOB/no palpitations/no leg swelling Respiratory: no cough/+ SOB/no wheezing Gastrointestinal: no N/no V/no D/no C/no acid reflux Musculoskeletal: + muscle aches/+ joint aches Skin: no rashes, no hair loss Neurological: + tremors/no numbness/no tingling/no dizziness  I reviewed pt's medications, allergies, PMH, social hx, family hx, and changes were documented in the history of present illness. Otherwise, unchanged from my initial visit note.  Past Medical History:  Diagnosis Date  . AKI (acute kidney injury) ([place]) 01/2017  . Anxiety    with panic attacks  . Arthritis    "back; feet; hands; shoulders" (08/26/2014)  . Asthma   . Cervical cancer ([place])   .  Chronic lower back pain   . Chronic narcotic use   . Chronic pain syndrome    [facility]  . Cirrhosis ([place])   . Clostridium difficile infection 2017  . COPD (chronic obstructive pulmonary disease) ([place])   . Daily headache   . Depression   . Diabetic neuropathy ([place]) 06/04/2017  . DJD (degenerative joint disease)   . Fatty liver disease, nonalcoholic   . Fibromyalgia   . Frequency of urination   . HCAP (healthcare-associated pneumonia) 08/26/2014  . History of TIA (transient ischemic attack) 11-01-2010   NO RESIDUAL  . Hyperlipidemia   . Hypertension   . Hypothyroidism   . IDDM (insulin dependent diabetes mellitus)   . Insomnia   . Lumbar stenosis   . Memory difficulty 07/25/2016  . Nocturia   . OSA (obstructive sleep apnea)    NO CPAP SINCE WT LOSS  . Osteoarthritis    with severe disease  in knee  . Pneumonia "several times"  . Polymyalgia rheumatica ([place])   . Scoliosis   . Seasonal allergies   . Thyroid cancer ([place])   . Urgency of urination   . Vaginal pain S/P SLING  FEB 2012   Past Surgical History:  Procedure Laterality Date  . APPENDECTOMY  1982  . BREAST EXCISIONAL BIOPSY Left 02/28/2005   Atypical Ductal Hyperplasia  . CARDIAC CATHETERIZATION  09-04-2004   NORMAL CORONARY ANATOMY/ NORMAL LVF/ EF 60%  . CARDIOVASCULAR STRESS TEST  12-27-2010  DR [place]   ABNORMAL NUCLEAR STUDY W/ /MILD INFERIOR ISCHEMIA/ EF 69%/  CT HEART ANGIOGRAM ;  NO ACUTE FINDINGS  . CRYOABLATION  05/16/2003   w/LEEP FOR ABNORMAL PAP SMEAR  . CYSTOSCOPY  05/18/2012   Procedure: CYSTOSCOPY;  Surgeon: Reece Packer, MD;  Location[facility];  Service: Urology;  Laterality: N/A;  examination under anethesia  . ESOPHAGOGASTRODUODENOSCOPY (EGD) WITH PROPOFOL N/A 09/03/2016   Procedure: ESOPHAGOGASTRODUODENOSCOPY (EGD) WITH PROPOFOL;  Surgeon: Doran Stabler, MD;  Location: [facility];  Service: Gastroenterology;  Laterality: N/A;  . HYSTEROSCOPY WITH D & C   08-19-2007   PMB  . KNEE ARTHROSCOPY W/ DEBRIDEMENT Left 03/29/2006   INTERNAL DERANGEMENT/ SEVERE DJD/ MENISCUS TEARS  . LAPAROSCOPIC CHOLECYSTECTOMY  06-10-2002  . LAPAROSCOPIC GASTRIC BANDING  03/01/2006   TRUNCAL VAGOTOMY/ PLACEMENT OF VG BAND  . REVISION TOTAL KNEE ARTHROPLASTY Left 08-29-2008; 05/2009  . TONSILLECTOMY  1969  . TOTAL KNEE ARTHROPLASTY Left 01-23-2007   SEVERE DJD  . TOTAL THYROIDECTOMY  11-22-2005   BILATERAL THYROID NODULES-- PAPILLARY CARCINOMA (0.5CM)/ ADENOMATOID NODULES  . TRANSTHORACIC ECHOCARDIOGRAM  12-27-2010   LVSF NORMAL / EF 50-53%/ GRADE I DIASTOLIC DYSFUNCTION/ MILD MITRAL REGURG. / MILDLY DILATED LEFT ATRIUM/ MILDY INCREASED SYSTOLIC PRESSURE OF PULMONARY ARTERIES  . TRANSVAGINAL SUBURETERAL TAPE/ SLING  09-28-2010   MIXED URINARY INCONTINENCE  . TUBAL LIGATION  1983   Social History   Socioeconomic History  . Marital status: Married    Spouse name: Not on file  . Number of children: 2  . Years of education: 76  . Highest education level: Not on file  Occupational History  . Occupation: disabled    Fish farm manager: UNEMPLOYED  Tobacco Use  . Smoking status: Former Smoker    Packs/day: 2.50    Years: 39.00    Pack years: 97.50    Types: Cigarettes    Quit date: 10/22/2010    Years since quitting: 9.7  . Smokeless tobacco: Never Used  Vaping Use  . Vaping Use: Never used  Substance and Sexual Activity  . Alcohol use: No    Alcohol/week: 0.0 standard drinks  . Drug use: No  . Sexual activity: Not Currently  Other Topics Concern  . Not on file  Social History Narrative   Lives at home w/ her husband    Right-handed   Caffeine: 1 cup of coffee per week + [employer]   Social Determinants of Health   Financial Resource Strain: High Risk  . Difficulty of Paying Living Expenses: Hard  Food Insecurity: Not on file  Transportation Needs: Not on file  Physical Activity: Not on file  Stress: Not on file  Social Connections: Not on file  Intimate  Partner Violence: Not At Risk  . Fear of Current or Ex-Partner: No  . Emotionally Abused: No  . Physically Abused: No  . Sexually Abused: No   Current Outpatient Medications on File Prior to Visit  Medication Sig  Dispense Refill  . acetaminophen (TYLENOL) 500 MG tablet Take 1,000 mg by mouth 2 (two) times daily.    Marland Kitchen albuterol (VENTOLIN HFA) 108 (90 Base) MCG/ACT inhaler TAKE 2 PUFFS BY MOUTH EVERY 6 HOURS AS NEEDED FOR WHEEZE OR SHORTNESS OF BREATH 18 g 11  . amitriptyline (ELAVIL) 100 MG tablet Take 100 mg by mouth at bedtime.    Marland Kitchen amLODipine (NORVASC) 5 MG tablet Take 1 tablet (5 mg total) by mouth 2 (two) times daily. 180 tablet 1  . aspirin EC 81 MG tablet Take 1 tablet (81 mg total) by mouth daily. 90 tablet 3  . ezetimibe (ZETIA) 10 MG tablet Take 1 tablet (10 mg total) by mouth daily. 30 tablet 1  . furosemide (LASIX) 40 MG tablet TAKE ONE TABLET BY MOUTH dAILY 30 tablet 5  . gabapentin (NEURONTIN) 300 MG capsule Take 300 mg by mouth 3 (three) times daily.     . hydrALAZINE (APRESOLINE) 100 MG tablet Take 1 tablet (100 mg total) by mouth 2 (two) times daily. 180 tablet 1  . hydrOXYzine (ATARAX/VISTARIL) 25 MG tablet Take 1 tablet (25 mg total) by mouth in the morning and at bedtime. 180 tablet 1  . insulin glargine (LANTUS) 100 UNIT/ML injection Inject 1.8 mLs (180 Units total) into the skin daily. (Patient taking differently: Inject 40 Units into the skin 2 (two) times daily. ) 60 mL 11  . Insulin Pen Needle (PEN NEEDLES) 31G X 8 MM MISC 1 Device by Does not apply route daily. 90 each 3  . Insulin Syringe-Needle U-100 26G X 1/2" 1 ML MISC Use daily for insulin injection as directed 100 each 3  . ipratropium-albuterol (DUONEB) 0.5-2.5 (3) MG/3ML SOLN Take 3 mLs by nebulization every 6 (six) hours as needed (Wheezing or dyspnea.). 360 mL 11  . levothyroxine (SYNTHROID) 200 MCG tablet Take 1 tablet (200 mcg total) by mouth daily before breakfast. 90 tablet 1  . meloxicam (MOBIC) 7.5 MG  tablet Take 7.5 mg by mouth daily.    . metFORMIN (GLUCOPHAGE) 500 MG tablet Take 1 tablet (500 mg total) by mouth 2 (two) times daily with a meal. 180 tablet 3  . metoprolol tartrate (LOPRESSOR) 25 MG tablet Take 1 tablet (25 mg total) by mouth 2 (two) times daily. 180 tablet 3  . morphine (MS CONTIN) 30 MG 12 hr tablet Take 30 mg by mouth 2 (two) times daily.    . ondansetron (ZOFRAN-ODT) 4 MG disintegrating tablet Take 1 tablet (4 mg total) by mouth every 6 (six) hours as needed for nausea or vomiting. 30 tablet 3  . PARoxetine (PAXIL) 40 MG tablet Take 1 tablet (40 mg total) by mouth daily. 90 tablet 1  . potassium chloride SA (KLOR-CON M20) 20 MEQ tablet TAKE 2 TABLETS EVERY DAY AS NEEDED FOR CRAMPING 180 tablet 1  . Semaglutide (RYBELSUS) 7 MG TABS Take 7 mg by mouth daily before breakfast. 90 tablet 1  . simvastatin (ZOCOR) 20 MG tablet Take 20 mg by mouth daily. (Patient not taking: Reported on 07/05/2020)    . tiZANidine (ZANAFLEX) 2 MG tablet Take 2 mg by mouth as needed for muscle spasms.   2   No current facility-administered medications on file prior to visit.   Allergies  Allergen Reactions  . Gabapentin Swelling    Swelling in legs Swelling in legs Swelling in legs  . Losartan Other (See Comments)    Myalgias and muscle cramping Other reaction(s): Other (See Comments) Myalgias and muscle cramping  Myalgias and muscle cramping Other reaction(s): Other (See Comments) Myalgias and muscle cramping  . Aricept [Donepezil Hcl]     Nausea/vomiting, low BP  . Oxycodone Itching  . Sulfa Antibiotics Nausea Only and Rash  . Sulfonamide Derivatives Nausea Only   Family History  Problem Relation Age of Onset  . Diabetes Mother   . Heart disease Mother   . Dementia Mother   . Heart disease Father   . High blood pressure Father   . Colon cancer Maternal Uncle        x 2  . Breast cancer Other        great aunts x 5    PE: BP 120/70   Pulse 73   Ht 5\' 9"  (1.753 m)   Wt  287 lb 3.2 oz (130.3 kg)   SpO2 (!) 88%   BMI 42.41 kg/m  Wt Readings from Last 3 Encounters:  08/04/20 287 lb 3.2 oz (130.3 kg)  07/05/20 297 lb 9.6 oz (135 kg)  05/31/20 (!) 315 lb (142.9 kg)   Constitutional: overweight, in NAD Eyes: PERRLA, EOMI, no exophthalmos ENT: moist mucous membranes, no thyromegaly, no cervical lymphadenopathy Cardiovascular: RRR, No MRG Respiratory: CTA B Gastrointestinal: abdomen soft, NT, ND, BS+ Musculoskeletal: no deformities, strength intact in all 4 Skin: moist, warm, no rashes Neurological: no tremor with outstretched hands, DTR normal in all 4  ASSESSMENT: 1. DM2, insulin-dependent, uncontrolled, with complications  2.  History of thyroid cancer  3.  Postsurgical hypothyroidism  PLAN:  1. Patient with longstanding, uncontrolled, diabetes, on long-acting insulin only at last visit, which was insufficient.  HbA1c was improved at that time, from 10.4% to 8.1%, but still above target.  At last visit, reviewing her blood sugars, they were much better after she started Rybelsus but she ran out of the medication 2 weeks prior to the appointment and was expecting her shipment from patient assistance.  I advised her to restart Rybelsus at the higher dose, 7 mg daily, however, she tells me that she did not get this to the patient assistance program yet.  I also recommended to start Metformin at 500 mg with dinner and build up to 1000 mg with this meal.  She is not taking this dose, without side effects. We also discussed at that time about improving diet and I made specific suggestions about reducing fat in her diet and she also agreed with a referral to nutrition (she did not have this appointment yet as she postponed it).  I also strongly advised her to stop sweet drinks >> she did stop. -However, before last visit, she lost 18 pounds and since then she lost 10 more! -At today's visit, we reviewed her blood sugars at home.  They are mostly above target, in  the 200s.  She has occasional lower blood sugars in the morning, but the majority of them are still above 200.  At this point, we discussed about starting a GLP-1 receptor agonist, but I believe a better option for her would be Ozempic, rather than Rybelsus.  I demonstrated Ozempic pen use.  We discussed about benefits and possible side effects.  We will start at a low dose and increase as tolerated.  For now, we gave her a sample of Ozempic pen and also printed the patient assistance application for her.  She will fill it out and bring it to the office so we can submit it to [employer] for her. -For now, we will continue her Basaglar at  the same dose and also Metformin. - I suggested to:  Patient Instructions  Please continue: - Metformin 1000 mg with dinner - Basaglar 80 units at bedtime  Please start : - Ozempic 0.25 mg weekly in a.m. (for example on Sunday morning) x 4 weeks, then increase to 0.5 mg weekly in a.m. if no nausea or hypoglycemia.  Please stop at the lab.  Please continue Levothyroxine 88 mcg daily.  Take the thyroid hormone every day, with water, at least 30 minutes before breakfast, separated by at least 4 hours from: - acid reflux medications - calcium - iron - multivitamins  Please return in 2 months with your sugar log.   - advised to check sugars at different times of the day - 1-2x a day, rotating check times - advised for yearly eye exams >> she is UTD - return to clinic in 3 months  2.  History of thyroid cancer -Per review of the chart, she had a papillary thyroid cancer focus in the right thyroid lobe, measuring 0.5 cm.  There was no vascular, lymphatic, or direct extension into the thyroid tissue. -No neck compression symptoms -She is most likely cured of her cancer but would like to obtain a neck ultrasound >> we will order this at next visit  3.  Postsurgical hypothyroidism - latest thyroid labs reviewed with pt >> normal: Lab Results  Component  Value Date   TSH 4.42 05/03/2020   - she continues on LT4 200 mcg daily (dose increase 04/2020) - pt feels good on this dose. - we discussed about taking the thyroid hormone every day, with water, >30 minutes before breakfast, separated by >4 hours from acid reflux medications, calcium, iron, multivitamins. Pt. is taking it correctly. - will check thyroid tests today: TSH and fT4 - If labs are abnormal, she will need to return for repeat TFTs in 1.5 months  Component     Latest Ref Rng & Units 08/04/2020  TSH     0.35 - 4.50 uIU/mL 0.14 (L)  T4,Free(Direct)     0.60 - 1.60 ng/dL 1.59   TSH is now suppressed.  I will ask her to decrease the dose to 200 mcg 6 out of 7 days and 100 mcg 1 out of 7 days (equivalent of 186 mcg daily).  We will recheck her tests at next visit.  Philemon Kingdom, MD PhD [facility]

## 2020-08-04 NOTE — Patient Instructions (Addendum)
Please continue: - Metformin 1000 mg with dinner - Basaglar 80 units at bedtime  Please start : - Ozempic 0.25 mg weekly in a.m. (for example on Sunday morning) x 4 weeks, then increase to 0.5 mg weekly in a.m. if no nausea or hypoglycemia.  Please stop at the lab.  Please continue Levothyroxine 88 mcg daily.  Take the thyroid hormone every day, with water, at least 30 minutes before breakfast, separated by at least 4 hours from: - acid reflux medications - calcium - iron - multivitamins  Please return in 2 months with your sugar log.

## 2020-08-07 ENCOUNTER — Encounter: Payer: Self-pay | Admitting: Internal Medicine

## 2020-08-08 DIAGNOSIS — I509 Heart failure, unspecified: Secondary | ICD-10-CM | POA: Diagnosis not present

## 2020-08-08 DIAGNOSIS — G8929 Other chronic pain: Secondary | ICD-10-CM | POA: Diagnosis not present

## 2020-08-08 DIAGNOSIS — G4733 Obstructive sleep apnea (adult) (pediatric): Secondary | ICD-10-CM | POA: Diagnosis not present

## 2020-08-08 DIAGNOSIS — I471 Supraventricular tachycardia: Secondary | ICD-10-CM | POA: Diagnosis not present

## 2020-08-08 DIAGNOSIS — E1159 Type 2 diabetes mellitus with other circulatory complications: Secondary | ICD-10-CM | POA: Diagnosis not present

## 2020-08-08 DIAGNOSIS — I110 Hypertensive heart disease with heart failure: Secondary | ICD-10-CM | POA: Diagnosis not present

## 2020-08-08 DIAGNOSIS — J9610 Chronic respiratory failure, unspecified whether with hypoxia or hypercapnia: Secondary | ICD-10-CM | POA: Diagnosis not present

## 2020-08-08 DIAGNOSIS — M545 Low back pain, unspecified: Secondary | ICD-10-CM | POA: Diagnosis not present

## 2020-08-08 DIAGNOSIS — E1140 Type 2 diabetes mellitus with diabetic neuropathy, unspecified: Secondary | ICD-10-CM | POA: Diagnosis not present

## 2020-08-08 DIAGNOSIS — J45909 Unspecified asthma, uncomplicated: Secondary | ICD-10-CM | POA: Diagnosis not present

## 2020-08-09 ENCOUNTER — Encounter: Payer: Medicare HMO | Attending: Internal Medicine | Admitting: Nutrition

## 2020-08-09 ENCOUNTER — Other Ambulatory Visit: Payer: Self-pay

## 2020-08-09 DIAGNOSIS — E1159 Type 2 diabetes mellitus with other circulatory complications: Secondary | ICD-10-CM | POA: Insufficient documentation

## 2020-08-09 DIAGNOSIS — J9610 Chronic respiratory failure, unspecified whether with hypoxia or hypercapnia: Secondary | ICD-10-CM | POA: Diagnosis not present

## 2020-08-09 DIAGNOSIS — Z794 Long term (current) use of insulin: Secondary | ICD-10-CM | POA: Diagnosis not present

## 2020-08-09 DIAGNOSIS — E1165 Type 2 diabetes mellitus with hyperglycemia: Secondary | ICD-10-CM | POA: Diagnosis not present

## 2020-08-09 DIAGNOSIS — G8929 Other chronic pain: Secondary | ICD-10-CM | POA: Diagnosis not present

## 2020-08-09 DIAGNOSIS — I110 Hypertensive heart disease with heart failure: Secondary | ICD-10-CM | POA: Diagnosis not present

## 2020-08-09 DIAGNOSIS — I471 Supraventricular tachycardia: Secondary | ICD-10-CM | POA: Diagnosis not present

## 2020-08-09 DIAGNOSIS — IMO0002 Reserved for concepts with insufficient information to code with codable children: Secondary | ICD-10-CM

## 2020-08-09 DIAGNOSIS — G4733 Obstructive sleep apnea (adult) (pediatric): Secondary | ICD-10-CM | POA: Diagnosis not present

## 2020-08-09 DIAGNOSIS — M545 Low back pain, unspecified: Secondary | ICD-10-CM | POA: Diagnosis not present

## 2020-08-09 DIAGNOSIS — E1140 Type 2 diabetes mellitus with diabetic neuropathy, unspecified: Secondary | ICD-10-CM | POA: Diagnosis not present

## 2020-08-09 DIAGNOSIS — J45909 Unspecified asthma, uncomplicated: Secondary | ICD-10-CM | POA: Diagnosis not present

## 2020-08-09 DIAGNOSIS — I509 Heart failure, unspecified: Secondary | ICD-10-CM | POA: Diagnosis not present

## 2020-08-10 NOTE — Progress Notes (Signed)
Pt. Was identified by name and DOB.  She is here to review her diet and insulin doses.  She admits that she had very poor eating habits due to inability to stand for long periods of time to cook.  "I have been trying to make a meat loaf for 3 days, but not able to stand to prepare it".  Lap band surgery "a few years ago". Insulin dose:  80u of Basaglar at HS.  Says if she forgets this (once a week, will take it in the AM.  Usuallly takes it between 6PM-8PM (bedtime) Blood sugar readings:  Pt. Did not bring meter:  Memory:  FBSs: (last 3 days had been high due to birthday cake from granddaughter;s birthday.  : 350s, today: 264.  Says usually in the high100s or low 200s. "cake is gone now" AcL: 350s, last few days, before that: 200s.,  HS: 185.   Low blood sugars:  "one time it was 77" this occurred 2-3 weeks ago.  None since then. Pt. Depressed due to 10yo son's death last year.  Typical day:   8:30-12PM  Up.  Drinks coffee with splenda and cream.   10-1PM: bfast:  Oatmeal with raisins.  Water to drink 3PM-4PM: Supper: 2 eggs and 1 Piece of bacon, 1 piece of bread-no topping, Koolaid with added Spenda. 1-2X/wk will have protein with 1 canned veg., prepaired by herself, one night/wk., neighbor will bring something like chicken and veg. for she and her husband.  Koolaid to drink 6PM: bedtime.   Denies eating after supper.  Wakes during the night several time to go to the bathroom.  Denies eating anything. Suggestions given:  1. Take multivitamin 2  Add protein to breakfast meal.  Suggestions given for other breakfasts  3. Stop regular Koolaid and get unsweetiend.  Other suggestions given to drink like noncaloric flavored waters, 4. Test blood sugar 2hr. pc first meal to determine effect of Koolaid on blood sugar reading   .

## 2020-08-11 DIAGNOSIS — J449 Chronic obstructive pulmonary disease, unspecified: Secondary | ICD-10-CM | POA: Diagnosis not present

## 2020-08-14 NOTE — Patient Instructions (Signed)
1. Take multivitamin 2  Add protein to breakfast meal.ie:  1-2 T. Peanut butter, one ounce of cheese, 1/4 cup nuts 3. Stop regular Koolaid and get unsweetiend. Drink noncaloric flavored waters, Crystal Light powders added to water bottles, Diet V-8 Splash 4. Test blood sugar 2hr. after first meal to determine effect of Koolaid on blood sugar reading.  If higher than 150, stop drinking this.  Marland Kitchen

## 2020-08-16 DIAGNOSIS — J9610 Chronic respiratory failure, unspecified whether with hypoxia or hypercapnia: Secondary | ICD-10-CM | POA: Diagnosis not present

## 2020-08-16 DIAGNOSIS — J45909 Unspecified asthma, uncomplicated: Secondary | ICD-10-CM | POA: Diagnosis not present

## 2020-08-16 DIAGNOSIS — I110 Hypertensive heart disease with heart failure: Secondary | ICD-10-CM | POA: Diagnosis not present

## 2020-08-16 DIAGNOSIS — E1159 Type 2 diabetes mellitus with other circulatory complications: Secondary | ICD-10-CM | POA: Diagnosis not present

## 2020-08-16 DIAGNOSIS — M545 Low back pain, unspecified: Secondary | ICD-10-CM | POA: Diagnosis not present

## 2020-08-16 DIAGNOSIS — E1140 Type 2 diabetes mellitus with diabetic neuropathy, unspecified: Secondary | ICD-10-CM | POA: Diagnosis not present

## 2020-08-16 DIAGNOSIS — I509 Heart failure, unspecified: Secondary | ICD-10-CM | POA: Diagnosis not present

## 2020-08-16 DIAGNOSIS — I471 Supraventricular tachycardia: Secondary | ICD-10-CM | POA: Diagnosis not present

## 2020-08-16 DIAGNOSIS — G8929 Other chronic pain: Secondary | ICD-10-CM | POA: Diagnosis not present

## 2020-08-16 DIAGNOSIS — G4733 Obstructive sleep apnea (adult) (pediatric): Secondary | ICD-10-CM | POA: Diagnosis not present

## 2020-08-20 DIAGNOSIS — I471 Supraventricular tachycardia: Secondary | ICD-10-CM | POA: Diagnosis not present

## 2020-08-20 DIAGNOSIS — E1140 Type 2 diabetes mellitus with diabetic neuropathy, unspecified: Secondary | ICD-10-CM | POA: Diagnosis not present

## 2020-08-20 DIAGNOSIS — J9610 Chronic respiratory failure, unspecified whether with hypoxia or hypercapnia: Secondary | ICD-10-CM | POA: Diagnosis not present

## 2020-08-20 DIAGNOSIS — I110 Hypertensive heart disease with heart failure: Secondary | ICD-10-CM | POA: Diagnosis not present

## 2020-08-20 DIAGNOSIS — I509 Heart failure, unspecified: Secondary | ICD-10-CM | POA: Diagnosis not present

## 2020-08-20 DIAGNOSIS — E1159 Type 2 diabetes mellitus with other circulatory complications: Secondary | ICD-10-CM | POA: Diagnosis not present

## 2020-08-20 DIAGNOSIS — M545 Low back pain, unspecified: Secondary | ICD-10-CM | POA: Diagnosis not present

## 2020-08-20 DIAGNOSIS — G8929 Other chronic pain: Secondary | ICD-10-CM | POA: Diagnosis not present

## 2020-08-20 DIAGNOSIS — G4733 Obstructive sleep apnea (adult) (pediatric): Secondary | ICD-10-CM | POA: Diagnosis not present

## 2020-08-20 DIAGNOSIS — J45909 Unspecified asthma, uncomplicated: Secondary | ICD-10-CM | POA: Diagnosis not present

## 2020-08-23 DIAGNOSIS — J45909 Unspecified asthma, uncomplicated: Secondary | ICD-10-CM | POA: Diagnosis not present

## 2020-08-23 DIAGNOSIS — M545 Low back pain, unspecified: Secondary | ICD-10-CM | POA: Diagnosis not present

## 2020-08-23 DIAGNOSIS — G8929 Other chronic pain: Secondary | ICD-10-CM | POA: Diagnosis not present

## 2020-08-23 DIAGNOSIS — I509 Heart failure, unspecified: Secondary | ICD-10-CM | POA: Diagnosis not present

## 2020-08-23 DIAGNOSIS — E1159 Type 2 diabetes mellitus with other circulatory complications: Secondary | ICD-10-CM | POA: Diagnosis not present

## 2020-08-23 DIAGNOSIS — I471 Supraventricular tachycardia: Secondary | ICD-10-CM | POA: Diagnosis not present

## 2020-08-23 DIAGNOSIS — G4733 Obstructive sleep apnea (adult) (pediatric): Secondary | ICD-10-CM | POA: Diagnosis not present

## 2020-08-23 DIAGNOSIS — E1140 Type 2 diabetes mellitus with diabetic neuropathy, unspecified: Secondary | ICD-10-CM | POA: Diagnosis not present

## 2020-08-23 DIAGNOSIS — J9610 Chronic respiratory failure, unspecified whether with hypoxia or hypercapnia: Secondary | ICD-10-CM | POA: Diagnosis not present

## 2020-08-23 DIAGNOSIS — I110 Hypertensive heart disease with heart failure: Secondary | ICD-10-CM | POA: Diagnosis not present

## 2020-08-24 ENCOUNTER — Other Ambulatory Visit: Payer: Self-pay | Admitting: Internal Medicine

## 2020-08-24 ENCOUNTER — Other Ambulatory Visit: Payer: Self-pay | Admitting: General Practice

## 2020-08-28 DIAGNOSIS — Z79891 Long term (current) use of opiate analgesic: Secondary | ICD-10-CM | POA: Diagnosis not present

## 2020-08-28 DIAGNOSIS — M5137 Other intervertebral disc degeneration, lumbosacral region: Secondary | ICD-10-CM | POA: Diagnosis not present

## 2020-08-28 DIAGNOSIS — G894 Chronic pain syndrome: Secondary | ICD-10-CM | POA: Diagnosis not present

## 2020-08-28 DIAGNOSIS — M533 Sacrococcygeal disorders, not elsewhere classified: Secondary | ICD-10-CM | POA: Diagnosis not present

## 2020-08-29 ENCOUNTER — Ambulatory Visit: Payer: Medicare HMO | Admitting: Adult Health

## 2020-08-29 ENCOUNTER — Telehealth: Payer: Self-pay | Admitting: Pharmacist

## 2020-08-29 ENCOUNTER — Encounter: Payer: Self-pay | Admitting: Adult Health

## 2020-08-29 VITALS — BP 101/65 | HR 68 | Ht 69.0 in | Wt 279.5 lb

## 2020-08-29 DIAGNOSIS — E1142 Type 2 diabetes mellitus with diabetic polyneuropathy: Secondary | ICD-10-CM | POA: Diagnosis not present

## 2020-08-29 DIAGNOSIS — R413 Other amnesia: Secondary | ICD-10-CM

## 2020-08-29 NOTE — Progress Notes (Signed)
PATIENT: Mary Acosta DOB: 12/17/1955  REASON FOR VISIT: follow up HISTORY FROM: patient  HISTORY OF PRESENT ILLNESS: Today 08/29/20 :  Mary Acosta is a 65 year old female with a history of peripheral neuropathy, fibromyalgia and memory disturbance.  She returns today for follow-up.  She states that she decreased her amitriptyline to 75 mg at bedtime.  She continues to take morphine twice a day and as needed.  Her pain is managed by [facility].  She feels that her memory is worse.  States that she stopped Aricept due to the cost.  Reports that she forgets conversations.  Has trouble remembering recipes.  Requires assistance with ADLs.  She manages all the finances without difficulty.  She does not operate a motor vehicle.  States that she is very sleepy this morning.  Reports that she went to bed at 2 AM and got up at 5 AM.  States that she did not take morphine this a.m.    HISTORY 12/03/17: Mary Acosta is a 65 year old female with a history of peripheral neuropathy and fibromyalgia.  She returns today for follow-up.  She continues on amitriptyline 100 mg at bedtime.  In the past she has tried to wean off of this medication but developed significant pain in the legs.  She states that the combination of amitriptyline, morphine and Dilaudid controls her peripheral neuropathy.  She states that since starting Aricept for her memory she has noticed the benefit.  She is able to complete most ADLs independently.  She does report that she has some orthopedic issues that makes mobility an issue at times.  She rarely operates a Teacher, music.  Denies any changes in her mood or behavior.  She reports at times she has trouble sleeping but most likely this is due to discomfort.  Her blood pressure is significantly elevated today.  Reports that she did not take her medication this morning.  She returns today for an evaluation    HISTORY Mary Acosta is a 65 year old right-handed  white female with a history of obesity, diabetes, and diabetic peripheral neuropathy. The patient has been on amitriptyline taking 100 mg at night for her neuropathy pain. On her last visit, we tried to reduce the dose down to 50 mg at night but the patient had significant increases in discomfort in the legs and she went back up to 100 mg at night. She was placed on Keppra for the discomfort, but she indicated this offered no benefit, and she stopped the drug. She continues to have some memory issues, she does operate a motor vehicle, she has not given up any activities of daily living because of memory. She has been evaluated for sleep apnea, she has had some problems with desaturation of her oxygen at night and she was placed on oxygen in the evening hours. The patient is on opiate medications. She comes to this office for an evaluation.    REVIEW OF SYSTEMS: Out of a complete 14 system review of symptoms, the patient complains only of the following symptoms, and all other reviewed systems are negative.  See HPI  ALLERGIES: Allergies  Allergen Reactions  . Gabapentin Swelling    Swelling in legs Swelling in legs Swelling in legs  . Losartan Other (See Comments)    Myalgias and muscle cramping Other reaction(s): Other (See Comments) Myalgias and muscle cramping Myalgias and muscle cramping Other reaction(s): Other (See Comments) Myalgias and muscle cramping  . Aricept [Donepezil Hcl]  Nausea/vomiting, low BP  . Oxycodone Itching  . Sulfa Antibiotics Nausea Only and Rash  . Sulfonamide Derivatives Nausea Only    HOME MEDICATIONS: Outpatient Medications Prior to Visit  Medication Sig Dispense Refill  . acetaminophen (TYLENOL) 500 MG tablet Take 1,000 mg by mouth 2 (two) times daily.    Marland Kitchen albuterol (VENTOLIN HFA) 108 (90 Base) MCG/ACT inhaler TAKE 2 PUFFS BY MOUTH EVERY 6 HOURS AS NEEDED FOR WHEEZE OR SHORTNESS OF BREATH 18 g 11  . amitriptyline (ELAVIL) 100 MG tablet Take 100 mg  by mouth at bedtime.    Marland Kitchen amLODipine (NORVASC) 5 MG tablet TAKE ONE TABLET BY MOUTH EVERY MORNING and TAKE ONE TABLET BY MOUTH EVERYDAY AT BEDTIME 180 tablet 2  . aspirin EC 81 MG tablet Take 1 tablet (81 mg total) by mouth daily. 90 tablet 3  . furosemide (LASIX) 40 MG tablet TAKE ONE TABLET BY MOUTH dAILY 30 tablet 5  . gabapentin (NEURONTIN) 300 MG capsule Take 300 mg by mouth 3 (three) times daily.     . hydrALAZINE (APRESOLINE) 100 MG tablet TAKE ONE TABLET BY MOUTH EVERY MORNING and TAKE ONE TABLET BY MOUTH EVERYDAY AT BEDTIME 180 tablet 2  . hydrOXYzine (ATARAX/VISTARIL) 25 MG tablet Take 1 tablet (25 mg total) by mouth in the morning and at bedtime. 180 tablet 1  . insulin glargine (LANTUS) 100 UNIT/ML injection Inject 1.8 mLs (180 Units total) into the skin daily. (Patient taking differently: Inject 80 Units into the skin daily.) 60 mL 11  . Insulin Pen Needle (PEN NEEDLES) 31G X 8 MM MISC 1 Device by Does not apply route daily. 90 each 3  . Insulin Syringe-Needle U-100 26G X 1/2" 1 ML MISC Use daily for insulin injection as directed 100 each 3  . ipratropium-albuterol (DUONEB) 0.5-2.5 (3) MG/3ML SOLN Take 3 mLs by nebulization every 6 (six) hours as needed (Wheezing or dyspnea.). 360 mL 11  . levothyroxine (SYNTHROID) 200 MCG tablet Take 1 tablet (200 mcg total) by mouth daily before breakfast. 90 tablet 1  . lubiprostone (AMITIZA) 24 MCG capsule Take by mouth.    . meloxicam (MOBIC) 7.5 MG tablet Take 7.5 mg by mouth daily.    . metFORMIN (GLUCOPHAGE) 500 MG tablet Take 1 tablet (500 mg total) by mouth 2 (two) times daily with a meal. 180 tablet 3  . metoprolol tartrate (LOPRESSOR) 25 MG tablet Take 1 tablet (25 mg total) by mouth 2 (two) times daily. 180 tablet 3  . morphine (MS CONTIN) 30 MG 12 hr tablet Take 30 mg by mouth 2 (two) times daily.    . naloxone (NARCAN) nasal spray 4 mg/0.1 mL One spray in either nostril once for known/suspected opioid overdose. May repeat every 2-3  minutes in alternating nostril til EMS arrives    . ondansetron (ZOFRAN-ODT) 4 MG disintegrating tablet Take 1 tablet (4 mg total) by mouth every 6 (six) hours as needed for nausea or vomiting. 30 tablet 3  . PARoxetine (PAXIL) 40 MG tablet Take 1 tablet (40 mg total) by mouth daily. 90 tablet 1  . potassium chloride SA (KLOR-CON M20) 20 MEQ tablet TAKE 2 TABLETS EVERY DAY AS NEEDED FOR CRAMPING 180 tablet 1  . Semaglutide,0.25 or 0.5MG /DOS, (OZEMPIC, 0.25 OR 0.5 MG/DOSE,) 2 MG/1.5ML SOPN Inject 0.5 mg into the skin once a week. 4.5 mL 3  . simvastatin (ZOCOR) 20 MG tablet TAKE ONE TABLET BY MOUTH EVERYDAY AT BEDTIME 30 tablet 6  . tiZANidine (ZANAFLEX) 2 MG tablet  Take 2 mg by mouth as needed for muscle spasms.   2  . traZODone (DESYREL) 100 MG tablet TAKE ONE TABLET BY MOUTH EVERYDAY AT BEDTIME    . ezetimibe (ZETIA) 10 MG tablet Take 1 tablet (10 mg total) by mouth daily. 30 tablet 1   No facility-administered medications prior to visit.    PAST MEDICAL HISTORY: Past Medical History:  Diagnosis Date  . AKI (acute kidney injury) (HCC) 01/2017  . Anxiety    with panic attacks  . Arthritis    "back; feet; hands; shoulders" (08/26/2014)  . Asthma   . Cervical cancer (HCC)   . Chronic lower back pain   . Chronic narcotic use   . Chronic pain syndrome    [facility]  . Cirrhosis (HCC)   . Clostridium difficile infection 2017  . COPD (chronic obstructive pulmonary disease) (HCC)   . Daily headache   . Depression   . Diabetic neuropathy (HCC) 06/04/2017  . DJD (degenerative joint disease)   . Fatty liver disease, nonalcoholic   . Fibromyalgia   . Frequency of urination   . HCAP (healthcare-associated pneumonia) 08/26/2014  . History of TIA (transient ischemic attack) 11-01-2010   NO RESIDUAL  . Hyperlipidemia   . Hypertension   . Hypothyroidism   . IDDM (insulin dependent diabetes mellitus)   . Insomnia   . Lumbar stenosis   . Memory difficulty 07/25/2016  .  Nocturia   . OSA (obstructive sleep apnea)    NO CPAP SINCE WT LOSS  . Osteoarthritis    with severe disease in knee  . Pneumonia "several times"  . Polymyalgia rheumatica (HCC)   . Scoliosis   . Seasonal allergies   . Thyroid cancer (HCC)   . Urgency of urination   . Vaginal pain S/P SLING  FEB 2012    PAST SURGICAL HISTORY: Past Surgical History:  Procedure Laterality Date  . APPENDECTOMY  1982  . BREAST EXCISIONAL BIOPSY Left 02/28/2005   Atypical Ductal Hyperplasia  . CARDIAC CATHETERIZATION  09-04-2004   NORMAL CORONARY ANATOMY/ NORMAL LVF/ EF 60%  . CARDIOVASCULAR STRESS TEST  12-27-2010  DR [place]   ABNORMAL NUCLEAR STUDY W/ /MILD INFERIOR ISCHEMIA/ EF 69%/  CT HEART ANGIOGRAM ;  NO ACUTE FINDINGS  . CRYOABLATION  05/16/2003   w/LEEP FOR ABNORMAL PAP SMEAR  . CYSTOSCOPY  05/18/2012   Procedure: CYSTOSCOPY;  Surgeon: Martina Sinner, MD;  Location: [facility];  Service: Urology;  Laterality: N/A;  examination under anethesia  . ESOPHAGOGASTRODUODENOSCOPY (EGD) WITH PROPOFOL N/A 09/03/2016   Procedure: ESOPHAGOGASTRODUODENOSCOPY (EGD) WITH PROPOFOL;  Surgeon: Sherrilyn Rist, MD;  Location: [facility];  Service: Gastroenterology;  Laterality: N/A;  . HYSTEROSCOPY WITH D & C  08-19-2007   PMB  . KNEE ARTHROSCOPY W/ DEBRIDEMENT Left 03/29/2006   INTERNAL DERANGEMENT/ SEVERE DJD/ MENISCUS TEARS  . LAPAROSCOPIC CHOLECYSTECTOMY  06-10-2002  . LAPAROSCOPIC GASTRIC BANDING  03/01/2006   TRUNCAL VAGOTOMY/ PLACEMENT OF VG BAND  . REVISION TOTAL KNEE ARTHROPLASTY Left 08-29-2008; 05/2009  . TONSILLECTOMY  1969  . TOTAL KNEE ARTHROPLASTY Left 01-23-2007   SEVERE DJD  . TOTAL THYROIDECTOMY  11-22-2005   BILATERAL THYROID NODULES-- PAPILLARY CARCINOMA (0.5CM)/ ADENOMATOID NODULES  . TRANSTHORACIC ECHOCARDIOGRAM  12-27-2010   LVSF NORMAL / EF 55-65%/ GRADE I DIASTOLIC DYSFUNCTION/ MILD MITRAL REGURG. / MILDLY DILATED LEFT ATRIUM/ MILDY INCREASED SYSTOLIC PRESSURE  OF PULMONARY ARTERIES  . TRANSVAGINAL SUBURETERAL TAPE/ SLING  09-28-2010   MIXED  URINARY INCONTINENCE  . TUBAL LIGATION  1983    FAMILY HISTORY: Family History  Problem Relation Age of Onset  . Diabetes Mother   . Heart disease Mother   . Dementia Mother   . Heart disease Father   . High blood pressure Father   . Colon cancer Maternal Uncle        x 2  . Breast cancer Other        great aunts x 5    SOCIAL HISTORY: Social History   Socioeconomic History  . Marital status: Married    Spouse name: Not on file  . Number of children: 2  . Years of education: 92  . Highest education level: Not on file  Occupational History  . Occupation: disabled    Fish farm manager: UNEMPLOYED  Tobacco Use  . Smoking status: Former Smoker    Packs/day: 2.50    Years: 39.00    Pack years: 97.50    Types: Cigarettes    Quit date: 10/22/2010    Years since quitting: 9.8  . Smokeless tobacco: Never Used  Vaping Use  . Vaping Use: Never used  Substance and Sexual Activity  . Alcohol use: No    Alcohol/week: 0.0 standard drinks  . Drug use: No  . Sexual activity: Not Currently  Other Topics Concern  . Not on file  Social History Narrative   Lives at home w/ her husband    Right-handed   Caffeine: 1 cup of coffee per week + [employer]   Social Determinants of Health   Financial Resource Strain: High Risk  . Difficulty of Paying Living Expenses: Hard  Food Insecurity: Not on file  Transportation Needs: Not on file  Physical Activity: Not on file  Stress: Not on file  Social Connections: Not on file  Intimate Partner Violence: Not At Risk  . Fear of Current or Ex-Partner: No  . Emotionally Abused: No  . Physically Abused: No  . Sexually Abused: No      PHYSICAL EXAM  Vitals:   08/29/20 0741  BP: 101/65  Pulse: 68  Weight: 279 lb 8 oz (126.8 kg)  Height: 5\' 9"  (1.753 m)   Body mass index is 41.27 kg/m.   MMSE - Mini Mental State Exam 08/29/2020 12/03/2017 06/04/2017   Orientation to time 4 5 5   Orientation to Place 5 4 5   Registration 1 3 3   Attention/ Calculation 1 3 1   Recall 1 2 1   Language- name 2 objects 2 2 2   Language- repeat 1 1 1   Language- follow 3 step command 3 3 2   Language- read & follow direction 1 1 1   Write a sentence 1 1 1   Copy design 0 1 0  Total score 20 26 22      Generalized: Well developed, in no acute distress   Neurological examination  Mentation: Alert oriented to time, place, history taking. Follows all commands speech and language fluent Cranial nerve II-XII: Pupils were equal round reactive to light. Extraocular movements were full, visual field were full on confrontational test. Facial sensation and strength were normal. Uvula tongue midline. Head turning and shoulder shrug  were normal and symmetric. Motor: The motor testing reveals 5 over 5 strength of all 4 extremities. Good symmetric motor tone is noted throughout.  Discoloration lower extremities bilaterally-ankle to calf. Sensory: Sensory testing is intact to soft touch on all 4 extremities. No evidence of extinction is noted.  Coordination: Cerebellar testing reveals good finger-nose-finger and heel-to-shin bilaterally.  Gait and station: Uses a walker when ambulating. Reflexes: Deep tendon reflexes are symmetric and normal bilaterally.   DIAGNOSTIC DATA (LABS, IMAGING, TESTING) - I reviewed patient records, labs, notes, testing and imaging myself where available.  Lab Results  Component Value Date   WBC 8.9 05/03/2020   HGB 12.2 05/03/2020   HCT 39.9 05/03/2020   MCV 82.1 05/03/2020   PLT 174 05/03/2020      Component Value Date/Time   NA 138 05/03/2020 1517   K 4.8 05/03/2020 1517   CL 96 (L) 05/03/2020 1517   CO2 34 (H) 05/03/2020 1517   GLUCOSE 268 (H) 05/03/2020 1517   BUN 14 05/03/2020 1517   CREATININE 0.97 05/03/2020 1517   CALCIUM 8.9 05/03/2020 1517   PROT 7.1 05/03/2020 1517   PROT 6.6 02/17/2020 1116   ALBUMIN 3.9 02/17/2020 1116    AST 22 05/03/2020 1517   ALT 16 05/03/2020 1517   ALKPHOS 115 02/17/2020 1116   BILITOT 0.5 05/03/2020 1517   BILITOT 0.3 02/17/2020 1116   GFRNONAA 62 05/03/2020 1517   GFRAA 72 05/03/2020 1517   Lab Results  Component Value Date   CHOL 126 05/03/2020   HDL 58 05/03/2020   LDLCALC 55 05/03/2020   TRIG 54 05/03/2020   CHOLHDL 2.2 05/03/2020   Lab Results  Component Value Date   HGBA1C 8.1 (A) 07/05/2020   Lab Results  Component Value Date   VITAMINB12 300 06/01/2018   Lab Results  Component Value Date   TSH 0.14 (L) 08/04/2020      ASSESSMENT AND PLAN 65 y.o. year old female  has a past medical history of AKI (acute kidney injury) ([place]) (01/2017), Anxiety, Arthritis, Asthma, Cervical cancer ([place]), Chronic lower back pain, Chronic narcotic use, Chronic pain syndrome, Cirrhosis ([place]), Clostridium difficile infection (2017), COPD (chronic obstructive pulmonary disease) ([place]), Daily headache, Depression, Diabetic neuropathy ([place]) (06/04/2017), DJD (degenerative joint disease), Fatty liver disease, nonalcoholic, Fibromyalgia, Frequency of urination, HCAP (healthcare-associated pneumonia) (08/26/2014), History of TIA (transient ischemic attack) (11-01-2010), Hyperlipidemia, Hypertension, Hypothyroidism, IDDM (insulin dependent diabetes mellitus), Insomnia, Lumbar stenosis, Memory difficulty (07/25/2016), Nocturia, OSA (obstructive sleep apnea), Osteoarthritis, Pneumonia ("several times"), Polymyalgia rheumatica ([place]), Scoliosis, Seasonal allergies, Thyroid cancer ([place]), Urgency of urination, and Vaginal pain (S/P SLING  FEB 2012). here with:  1.  Memory disturbance 2.  Peripheral neuropathy   The patient's memory score has declined since she was last seen in 2019.  Per her chart she has an allergy to Aricept-nausea vomiting and low blood pressure.  The patient states that she feels that she stopped it due to cost.   She will also be sent for neuropsychological testing.  For now I  will not start her on any additional medication until after she has neuropsychological testing. She will continue at [facility] pain management for neuropathy and fibromyalgia.  Advised to follow-up in 6 months or sooner if needed.   Ward Givens, MSN, NP-C 08/29/2020, 9:15 AM [facility] [address] [place], [place] [address] [phone]

## 2020-08-29 NOTE — Progress Notes (Signed)
Chronic Care Management Pharmacy Assistant   Name: Rivkah Baye  MRN: [id number] DOB: Jun 04, 1956  Reason for Encounter: Medication Review  Patient Questions:  1.  Have you seen any other providers since your last visit? Yes, the patient last seen Dr, Elvera Lennox on 08/04/20, Al Corpus from Pain Medicine on 08/28/20 and Butch Penny NP Neurology on 08/29/20  2.  Any changes in your medicines or health? Yes, Dr. Lafe Garin added Ozempic 0.5 mg weekly and Al Corpus decreased amitriptyline to 75 mg nightly and restarted Amitiza 24 mg twice daily    PCP : Pincus Sanes, MD  Allergies:   Allergies  Allergen Reactions   Gabapentin Swelling    Swelling in legs Swelling in legs Swelling in legs   Losartan Other (See Comments)    Myalgias and muscle cramping Other reaction(s): Other (See Comments) Myalgias and muscle cramping Myalgias and muscle cramping Other reaction(s): Other (See Comments) Myalgias and muscle cramping   Aricept [Donepezil Hcl]     Nausea/vomiting, low BP   Oxycodone Itching   Sulfa Antibiotics Nausea Only and Rash   Sulfonamide Derivatives Nausea Only    Medications: Outpatient Encounter Medications as of 08/29/2020  Medication Sig Note   acetaminophen (TYLENOL) 500 MG tablet Take 1,000 mg by mouth 2 (two) times daily.    albuterol (VENTOLIN HFA) 108 (90 Base) MCG/ACT inhaler TAKE 2 PUFFS BY MOUTH EVERY 6 HOURS AS NEEDED FOR WHEEZE OR SHORTNESS OF BREATH    amitriptyline (ELAVIL) 100 MG tablet Take 100 mg by mouth at bedtime. 08/29/2020: 08/29/20 taking 75 mg bedtime   amLODipine (NORVASC) 5 MG tablet TAKE ONE TABLET BY MOUTH EVERY MORNING and TAKE ONE TABLET BY MOUTH EVERYDAY AT BEDTIME    aspirin EC 81 MG tablet Take 1 tablet (81 mg total) by mouth daily.    ezetimibe (ZETIA) 10 MG tablet Take 1 tablet (10 mg total) by mouth daily. 06/05/2020: Holding due to leg cramps   furosemide (LASIX) 40 MG tablet TAKE ONE TABLET BY MOUTH dAILY     gabapentin (NEURONTIN) 300 MG capsule Take 300 mg by mouth 3 (three) times daily.  08/29/2020: As needed   hydrALAZINE (APRESOLINE) 100 MG tablet TAKE ONE TABLET BY MOUTH EVERY MORNING and TAKE ONE TABLET BY MOUTH EVERYDAY AT BEDTIME    hydrOXYzine (ATARAX/VISTARIL) 25 MG tablet Take 1 tablet (25 mg total) by mouth in the morning and at bedtime.    insulin glargine (LANTUS) 100 UNIT/ML injection Inject 1.8 mLs (180 Units total) into the skin daily. (Patient taking differently: Inject 80 Units into the skin daily.) 02/14/2020: Basaglar via [employer] Cares PAP   Insulin Pen Needle (PEN NEEDLES) 31G X 8 MM MISC 1 Device by Does not apply route daily.    Insulin Syringe-Needle U-100 26G X 1/2" 1 ML MISC Use daily for insulin injection as directed    ipratropium-albuterol (DUONEB) 0.5-2.5 (3) MG/3ML SOLN Take 3 mLs by nebulization every 6 (six) hours as needed (Wheezing or dyspnea.).    levothyroxine (SYNTHROID) 200 MCG tablet Take 1 tablet (200 mcg total) by mouth daily before breakfast.    lubiprostone (AMITIZA) 24 MCG capsule Take by mouth. 08/29/2020: 08/29/20 hasn't picked up yet   meloxicam (MOBIC) 7.5 MG tablet Take 7.5 mg by mouth daily.    metFORMIN (GLUCOPHAGE) 500 MG tablet Take 1 tablet (500 mg total) by mouth 2 (two) times daily with a meal.    metoprolol tartrate (LOPRESSOR) 25 MG tablet Take 1 tablet (25 mg  total) by mouth 2 (two) times daily.    morphine (MS CONTIN) 30 MG 12 hr tablet Take 30 mg by mouth 2 (two) times daily.    naloxone (NARCAN) nasal spray 4 mg/0.1 mL One spray in either nostril once for known/suspected opioid overdose. May repeat every 2-3 minutes in alternating nostril til EMS arrives    ondansetron (ZOFRAN-ODT) 4 MG disintegrating tablet Take 1 tablet (4 mg total) by mouth every 6 (six) hours as needed for nausea or vomiting.    PARoxetine (PAXIL) 40 MG tablet Take 1 tablet (40 mg total) by mouth daily.    potassium chloride SA (KLOR-CON M20) 20 MEQ tablet TAKE 2  TABLETS EVERY DAY AS NEEDED FOR CRAMPING 08/29/2020: 08/29/20 very rarely   Semaglutide,0.25 or 0.5MG /DOS, (OZEMPIC, 0.25 OR 0.5 MG/DOSE,) 2 MG/1.5ML SOPN Inject 0.5 mg into the skin once a week.    simvastatin (ZOCOR) 20 MG tablet TAKE ONE TABLET BY MOUTH EVERYDAY AT BEDTIME    tiZANidine (ZANAFLEX) 2 MG tablet Take 2 mg by mouth as needed for muscle spasms.     traZODone (DESYREL) 100 MG tablet TAKE ONE TABLET BY MOUTH EVERYDAY AT BEDTIME    No facility-administered encounter medications on file as of 08/29/2020.    Current Diagnosis: Patient Active Problem List   Diagnosis Date Noted   Uncontrolled type 2 diabetes mellitus with circulatory disorder, with long-term current use of insulin (HCC) 07/05/2020   PSVT (paroxysmal supraventricular tachycardia) (HCC) 05/31/2020   UTI (urinary tract infection) 03/01/2020   Grief 02/03/2019   Nausea 01/04/2019   Reactive airway disease 10/21/2018   Arthralgia 09/04/2018   Narcotic overdose (HCC) 05/14/2018   Thyroid cancer (HCC) 04/30/2018   Obesity hypoventilation syndrome (HCC) 09/24/2017   Palpitations 09/18/2017   Diabetic neuropathy (HCC) 06/04/2017   Vitamin B12 deficiency 03/05/2017   Numbness and tingling in both hands 02/24/2017   OSA (obstructive sleep apnea) 01/31/2017   Liver cirrhosis secondary to NASH (HCC) 01/31/2017   Hypothyroidism 01/31/2017   Chronic narcotic use 01/31/2017   Fibromyalgia 01/31/2017   Hyperlipidemia 01/31/2017   Gastroparesis    Memory disorder 07/25/2016   Lump in neck 01/29/2016   Chronic diastolic (congestive) heart failure (HCC) 01/03/2016   Fatty liver 01/03/2016   Type 1 diabetes mellitus (HCC) 01/03/2016   Recurrent Clostridium difficile diarrhea 01/03/2016   Chronic respiratory failure (HCC) 11/10/2014   Physical deconditioning 09/26/2014   Dyspnea 09/26/2014   Iron deficiency anemia, unspecified  04/05/2011   Bariatric surgery status 04/05/2011   Left arm  pain    UNSPECIFIED VITAMIN D DEFICIENCY 10/22/2007   LOW BACK PAIN, CHRONIC 10/22/2007   INSOMNIA 10/22/2007   Obesity 10/14/2007   Chronic pain syndrome 10/14/2007   CARPAL TUNNEL SYNDROME 10/14/2007   ALLERGIC RHINITIS CAUSE UNSPECIFIED 10/14/2007   ARTHRITIS 10/14/2007   Anxiety state 08/25/2007   Depression 08/25/2007   Essential hypertension 08/25/2007   ASTHMA 08/25/2007   CONSTIPATION 08/25/2007   POLYMYALGIA RHEUMATICA 08/25/2007   LEG EDEMA, BILATERAL 08/25/2007    Goals Addressed   None     Follow-Up:  Coordination of Enhanced Pharmacy Services   Reviewed chart for medication changes ahead of medication coordination call.  No OVs, Consults, or hospital visits since last care coordination call/Pharmacist visit.  No medication changes indicated  BP Readings from Last 3 Encounters:  08/29/20 101/65  08/04/20 120/70  07/05/20 130/82    Lab Results  Component Value Date   HGBA1C 8.1 (A) 07/05/2020     Patient obtains  medications through Adherence Packaging  30 Days   Last adherence delivery included: (medication name and frequency)   Metoprolol tartrate 25 mg 1 tab breakfast and 1 tab bedtime Simvastatin 20 mg 1 tab bedtime Amlodipine 5 mg breakfast and 1 tab bedtime Hydralazine 100 mg 1 tab breakfast and 1 tab bedtime Amitriptyline 100 1 tab bedtime  Hydroxyzine Hcl 25 mg 1 tab breakfast and 1 tab bedtime Levothyroxine 200 mcg 1 tab daily breakfast Paroxetine 40 mg 1 tab bedtime Gabapentin 300 mg 1 cap 3x daily as needed(vial) Furosemide 40 mg 1 tab daily Trazodone 100 mg 1 tab bedtime  Tizanidine 2 mg 1 tab every 8 hours as needed(vial) Morphine sulf Er 30 mg; take one tab bid (VIAL)  Morphine sulf ER 15 mg: 1 tab daily (VIAL) - **due 12/3**  Bd insulin syringe ultra fine 1 MI 30 Gauge x1/2, use as directed Bd uf short pen needle 21mmx31g, use as directed  Patient declined Potassium last month due to additional supply on  hand. Explanation of abundance on hand (ie #30 due to overlapping fills or previous adherence issues etc)  Patient is due for next adherence delivery on: 08/30/2020. Called patient and reviewed medications and coordinated delivery.  This delivery to include: Metoprolol tartrate 25 mg 1 tab breakfast and 1 tab bedtime Simvastatin 20 mg 1 tab bedtime Amlodipine 5 mg breakfast and 1 tab bedtime Hydralazine 100 mg 1 tab breakfast and 1 tab bedtime Amitriptyline 25 mg 3 tabs bedtime- dose change Hydroxyzine Hcl 25 mg 1 tab breakfast and 1 tab bedtime Levothyroxine 200 mcg 1 tab daily breakfast Paroxetine 40 mg 1 tab bedtime Gabapentin 300 mg 1 cap 3x daily as needed(vial) Furosemide 40 mg 1 tab daily Trazodone 100 mg 1 tab bedtime  Tizanidine 2 mg 1 tab every 8 hours as needed(vial) Ondansetron 4 mg PRN (Vial) Lubiprostone 24 mcg 1 cap twice daily with meals (vial) Naloxone 4 mg one spray in either nostril once for known/suspected opioid overdose, may repeat every 2-3 minutes in alternating nostril til EMS arrives Morphine sulf Er 30 mg; take one tab bid (VIAL)  Morphine sulf ER 15 mg: 1 tab daily (VIAL) - **due 12/3**   Patient needs refills for Gabapentin.  Confirmed delivery date of 08/30/2020, advised patient that pharmacy will contact them the morning of delivery.   Wendy Poet, Clinical Pharmacist Assistant [facility]

## 2020-08-29 NOTE — Progress Notes (Signed)
I have read the note, and I agree with the clinical assessment and plan.  Nafis Farnan K Gracelynn Bircher   

## 2020-08-31 NOTE — Progress Notes (Unsigned)
Virtual Visit via Video Note  I connected with Mary Acosta on 09/01/20 at  3:15 PM EST by a video enabled telemedicine application and verified that I am speaking with the correct person using two identifiers.   I discussed the limitations of evaluation and management by telemedicine and the availability of in person appointments. The patient expressed understanding and agreed to proceed.  Present for the visit:  Myself, Dr Billey Gosling, Wilhemina Bonito.  The patient is currently at home and I am in the office.    No referring provider.    History of Present Illness: She is here for an acute visit for cold symptoms.   Her symptoms started one week ago  She is experiencing a sore throat, mild cough - mostly dry, but occasionally she will bring up some sputum and generalized weakness.  She does have some occasional shortness of breath, but this is not new.  She denies any wheezing or fevers.  Her husband has similar symptoms and he did have an antibiotic called in by pulmonary and that did help him.  She has had all 3 Covid vaccines   Review of Systems  Constitutional: Positive for malaise/fatigue. Negative for fever.  HENT: Positive for sore throat. Negative for congestion and sinus pain.   Respiratory: Positive for cough (croupy cough - typically dry), sputum production (occ) and shortness of breath (occ - chronic). Negative for wheezing.   Gastrointestinal:       No new GI symptoms  Neurological: Positive for dizziness (chronic) and headaches (chronic).      Social History   Socioeconomic History  . Marital status: Married    Spouse name: Not on file  . Number of children: 2  . Years of education: 52  . Highest education level: Not on file  Occupational History  . Occupation: disabled    Fish farm manager: UNEMPLOYED  Tobacco Use  . Smoking status: Former Smoker    Packs/day: 2.50    Years: 39.00    Pack years: 97.50    Types: Cigarettes    Quit date: 10/22/2010     Years since quitting: 9.8  . Smokeless tobacco: Never Used  Vaping Use  . Vaping Use: Never used  Substance and Sexual Activity  . Alcohol use: No    Alcohol/week: 0.0 standard drinks  . Drug use: No  . Sexual activity: Not Currently  Other Topics Concern  . Not on file  Social History Narrative   Lives at home w/ her husband    Right-handed   Caffeine: 1 cup of coffee per week + [employer]   Social Determinants of Health   Financial Resource Strain: High Risk  . Difficulty of Paying Living Expenses: Hard  Food Insecurity: Not on file  Transportation Needs: Not on file  Physical Activity: Not on file  Stress: Not on file  Social Connections: Not on file     Observations/Objective: Appears well in NAD Intermittent cough, breathing normally Skin appears warm and dry  Assessment and Plan:  See Problem List for Assessment and Plan of chronic medical problems.   Follow Up Instructions:    I discussed the assessment and treatment plan with the patient. The patient was provided an opportunity to ask questions and all were answered. The patient agreed with the plan and demonstrated an understanding of the instructions.   The patient was advised to call back or seek an in-person evaluation if the symptoms worsen or if the condition fails to improve as  anticipated.    Pincus Sanes, MD

## 2020-09-01 ENCOUNTER — Encounter: Payer: Self-pay | Admitting: Internal Medicine

## 2020-09-01 ENCOUNTER — Telehealth (INDEPENDENT_AMBULATORY_CARE_PROVIDER_SITE_OTHER): Payer: Medicare HMO | Admitting: Internal Medicine

## 2020-09-01 DIAGNOSIS — J069 Acute upper respiratory infection, unspecified: Secondary | ICD-10-CM | POA: Diagnosis not present

## 2020-09-01 MED ORDER — AZITHROMYCIN 250 MG PO TABS: ORAL_TABLET | ORAL | 0 refills | Status: DC

## 2020-09-01 NOTE — Assessment & Plan Note (Signed)
Acute Difficult to say if this is viral/Covid versus bacterial Husband is also sick with similar symptoms and improved with a Z-Pak, which is prescribed by his pulmonologist We are limited because this is a virtual visit Will prescribe a Z-Pak Advised over-the-counter cold medications as needed for symptom relief Increased fluids and rest Call if no improvement

## 2020-09-05 DIAGNOSIS — J449 Chronic obstructive pulmonary disease, unspecified: Secondary | ICD-10-CM | POA: Diagnosis not present

## 2020-09-08 ENCOUNTER — Telehealth: Payer: Self-pay

## 2020-09-08 NOTE — Telephone Encounter (Signed)
Patient's assistance arrived today for Mary Acosta; 7 mg tab # 30 and 14 mg #30. Patient's husband will pick up meds today.

## 2020-09-11 DIAGNOSIS — J449 Chronic obstructive pulmonary disease, unspecified: Secondary | ICD-10-CM | POA: Diagnosis not present

## 2020-09-15 ENCOUNTER — Telehealth: Payer: Self-pay | Admitting: Internal Medicine

## 2020-09-15 NOTE — Telephone Encounter (Signed)
Mary Acosta w/ [facility] called and said that the patient missed 1 PT visit this week because of inclimate  weather.

## 2020-09-18 DIAGNOSIS — E119 Type 2 diabetes mellitus without complications: Secondary | ICD-10-CM | POA: Diagnosis not present

## 2020-09-19 DIAGNOSIS — I110 Hypertensive heart disease with heart failure: Secondary | ICD-10-CM | POA: Diagnosis not present

## 2020-09-19 DIAGNOSIS — Z7982 Long term (current) use of aspirin: Secondary | ICD-10-CM | POA: Diagnosis not present

## 2020-09-19 DIAGNOSIS — G4733 Obstructive sleep apnea (adult) (pediatric): Secondary | ICD-10-CM | POA: Diagnosis not present

## 2020-09-19 DIAGNOSIS — E1159 Type 2 diabetes mellitus with other circulatory complications: Secondary | ICD-10-CM | POA: Diagnosis not present

## 2020-09-19 DIAGNOSIS — E669 Obesity, unspecified: Secondary | ICD-10-CM | POA: Diagnosis not present

## 2020-09-19 DIAGNOSIS — E1140 Type 2 diabetes mellitus with diabetic neuropathy, unspecified: Secondary | ICD-10-CM | POA: Diagnosis not present

## 2020-09-19 DIAGNOSIS — R32 Unspecified urinary incontinence: Secondary | ICD-10-CM | POA: Diagnosis not present

## 2020-09-19 DIAGNOSIS — E785 Hyperlipidemia, unspecified: Secondary | ICD-10-CM | POA: Diagnosis not present

## 2020-09-19 DIAGNOSIS — J45909 Unspecified asthma, uncomplicated: Secondary | ICD-10-CM | POA: Diagnosis not present

## 2020-09-19 DIAGNOSIS — Z6841 Body Mass Index (BMI) 40.0 and over, adult: Secondary | ICD-10-CM | POA: Diagnosis not present

## 2020-09-19 DIAGNOSIS — I509 Heart failure, unspecified: Secondary | ICD-10-CM | POA: Diagnosis not present

## 2020-09-19 DIAGNOSIS — F419 Anxiety disorder, unspecified: Secondary | ICD-10-CM | POA: Diagnosis not present

## 2020-09-19 DIAGNOSIS — Z794 Long term (current) use of insulin: Secondary | ICD-10-CM | POA: Diagnosis not present

## 2020-09-19 DIAGNOSIS — J9610 Chronic respiratory failure, unspecified whether with hypoxia or hypercapnia: Secondary | ICD-10-CM | POA: Diagnosis not present

## 2020-09-19 DIAGNOSIS — I471 Supraventricular tachycardia: Secondary | ICD-10-CM | POA: Diagnosis not present

## 2020-09-19 DIAGNOSIS — Z9981 Dependence on supplemental oxygen: Secondary | ICD-10-CM | POA: Diagnosis not present

## 2020-09-19 DIAGNOSIS — E538 Deficiency of other specified B group vitamins: Secondary | ICD-10-CM | POA: Diagnosis not present

## 2020-09-19 DIAGNOSIS — M545 Low back pain, unspecified: Secondary | ICD-10-CM | POA: Diagnosis not present

## 2020-09-19 DIAGNOSIS — G8929 Other chronic pain: Secondary | ICD-10-CM | POA: Diagnosis not present

## 2020-09-19 DIAGNOSIS — F32A Depression, unspecified: Secondary | ICD-10-CM | POA: Diagnosis not present

## 2020-09-19 DIAGNOSIS — D649 Anemia, unspecified: Secondary | ICD-10-CM | POA: Diagnosis not present

## 2020-09-19 DIAGNOSIS — M797 Fibromyalgia: Secondary | ICD-10-CM | POA: Diagnosis not present

## 2020-09-19 DIAGNOSIS — E039 Hypothyroidism, unspecified: Secondary | ICD-10-CM | POA: Diagnosis not present

## 2020-09-19 DIAGNOSIS — Z7984 Long term (current) use of oral hypoglycemic drugs: Secondary | ICD-10-CM | POA: Diagnosis not present

## 2020-09-20 ENCOUNTER — Telehealth: Payer: Self-pay | Admitting: Adult Health

## 2020-09-20 DIAGNOSIS — E1142 Type 2 diabetes mellitus with diabetic polyneuropathy: Secondary | ICD-10-CM

## 2020-09-20 DIAGNOSIS — R413 Other amnesia: Secondary | ICD-10-CM

## 2020-09-20 NOTE — Telephone Encounter (Signed)
Pt called, At my appt on 1/4 the NP put me back on Aricept. [facility] do not have my prescription for Aricept. Would like a call from the nurse

## 2020-09-20 NOTE — Telephone Encounter (Signed)
Per MM/NP will not start new medication until after neuropsych testing. Tried to call pt and busy. Will try later.

## 2020-09-20 NOTE — Telephone Encounter (Signed)
I spoke to the patient. She verbalized understanding of the plan. She would like to speak help scheduling her neuropsychiatric testing.

## 2020-09-21 NOTE — Telephone Encounter (Signed)
Will you place a Referral for me please so I can send . Thanks Hinton Dyer  I have talked to patient and Virgel Bouquet will call her to schedule patient has telephone number and address .

## 2020-09-26 ENCOUNTER — Telehealth: Payer: Self-pay | Admitting: Pharmacist

## 2020-09-26 NOTE — Progress Notes (Addendum)
Chronic Care Management Pharmacy Assistant   Name: Mary Acosta  MRN: [id number] DOB: [birth year]  Reason for Encounter: Medication Review  Patient Questions:  1.  Have you seen any other providers since your last visit? Yes, patient had video visit with Dr. Billey Gosling on 09/01/20  and [facility] pain management on 08/30/20  2.  Any changes in your medicines or health? Yes , [facility] pain mgmt started Movantik after lubiprostone was denied by insurance.    PCP : Binnie Rail, MD  Allergies:   Allergies  Allergen Reactions   Gabapentin Swelling    Swelling in legs Swelling in legs Swelling in legs   Losartan Other (See Comments)    Myalgias and muscle cramping Other reaction(s): Other (See Comments) Myalgias and muscle cramping Myalgias and muscle cramping Other reaction(s): Other (See Comments) Myalgias and muscle cramping   Aricept [Donepezil Hcl]     Nausea/vomiting, low BP   Oxycodone Itching   Sulfa Antibiotics Nausea Only and Rash   Sulfonamide Derivatives Nausea Only    Medications: Outpatient Encounter Medications as of 09/26/2020  Medication Sig Note   acetaminophen (TYLENOL) 500 MG tablet Take 1,000 mg by mouth 2 (two) times daily.    albuterol (VENTOLIN HFA) 108 (90 Base) MCG/ACT inhaler TAKE 2 PUFFS BY MOUTH EVERY 6 HOURS AS NEEDED FOR WHEEZE OR SHORTNESS OF BREATH    amitriptyline (ELAVIL) 100 MG tablet Take 100 mg by mouth at bedtime. 08/29/2020: 08/29/20 taking 75 mg bedtime   amLODipine (NORVASC) 5 MG tablet TAKE ONE TABLET BY MOUTH EVERY MORNING and TAKE ONE TABLET BY MOUTH EVERYDAY AT BEDTIME    aspirin EC 81 MG tablet Take 1 tablet (81 mg total) by mouth daily.    azithromycin (ZITHROMAX) 250 MG tablet Take two tabs the first day and then one tab daily for four days    ezetimibe (ZETIA) 10 MG tablet Take 1 tablet (10 mg total) by mouth daily. 06/05/2020: Holding due to leg cramps   furosemide (LASIX) 40 MG tablet TAKE ONE TABLET BY MOUTH dAILY    gabapentin  (NEURONTIN) 300 MG capsule Take 300 mg by mouth 3 (three) times daily.  08/29/2020: As needed   hydrALAZINE (APRESOLINE) 100 MG tablet TAKE ONE TABLET BY MOUTH EVERY MORNING and TAKE ONE TABLET BY MOUTH EVERYDAY AT BEDTIME    hydrOXYzine (ATARAX/VISTARIL) 25 MG tablet Take 1 tablet (25 mg total) by mouth in the morning and at bedtime.    insulin glargine (LANTUS) 100 UNIT/ML injection Inject 1.8 mLs (180 Units total) into the skin daily. (Patient taking differently: Inject 80 Units into the skin daily.) 02/14/2020: Basaglar via [employer] Cares PAP   Insulin Pen Needle (PEN NEEDLES) 31G X 8 MM MISC 1 Device by Does not apply route daily.    Insulin Syringe-Needle U-100 26G X 1/2" 1 ML MISC Use daily for insulin injection as directed    ipratropium-albuterol (DUONEB) 0.5-2.5 (3) MG/3ML SOLN Take 3 mLs by nebulization every 6 (six) hours as needed (Wheezing or dyspnea.).    levothyroxine (SYNTHROID) 200 MCG tablet Take 1 tablet (200 mcg total) by mouth daily before breakfast.    lubiprostone (AMITIZA) 24 MCG capsule Take by mouth. 08/29/2020: 08/29/20 hasn't picked up yet   meloxicam (MOBIC) 7.5 MG tablet Take 7.5 mg by mouth daily.    metFORMIN (GLUCOPHAGE) 500 MG tablet Take 1 tablet (500 mg total) by mouth 2 (two) times daily with a meal.    metoprolol tartrate (LOPRESSOR) 25 MG  tablet Take 1 tablet (25 mg total) by mouth 2 (two) times daily.    morphine (MS CONTIN) 30 MG 12 hr tablet Take 30 mg by mouth 2 (two) times daily.    naloxone (NARCAN) nasal spray 4 mg/0.1 mL One spray in either nostril once for known/suspected opioid overdose. May repeat every 2-3 minutes in alternating nostril til EMS arrives    ondansetron (ZOFRAN-ODT) 4 MG disintegrating tablet Take 1 tablet (4 mg total) by mouth every 6 (six) hours as needed for nausea or vomiting.    PARoxetine (PAXIL) 40 MG tablet Take 1 tablet (40 mg total) by mouth daily.    potassium chloride SA (KLOR-CON M20) 20 MEQ tablet TAKE 2 TABLETS EVERY DAY AS NEEDED  FOR CRAMPING 08/29/2020: 08/29/20 very rarely   Semaglutide,0.25 or 0.5MG /DOS, (OZEMPIC, 0.25 OR 0.5 MG/DOSE,) 2 MG/1.5ML SOPN Inject 0.5 mg into the skin once a week.    simvastatin (ZOCOR) 20 MG tablet TAKE ONE TABLET BY MOUTH EVERYDAY AT BEDTIME    tiZANidine (ZANAFLEX) 2 MG tablet Take 2 mg by mouth as needed for muscle spasms.     traZODone (DESYREL) 100 MG tablet TAKE ONE TABLET BY MOUTH EVERYDAY AT BEDTIME    No facility-administered encounter medications on file as of 09/26/2020.    Current Diagnosis: Patient Active Problem List   Diagnosis Date Noted   URI (upper respiratory infection) 09/01/2020   Uncontrolled type 2 diabetes mellitus with circulatory disorder, with long-term current use of insulin ([place]) 07/05/2020   PSVT (paroxysmal supraventricular tachycardia) ([place]) 05/31/2020   UTI (urinary tract infection) 03/01/2020   Grief 02/03/2019   Nausea 01/04/2019   Reactive airway disease 10/21/2018   Arthralgia 09/04/2018   Narcotic overdose ([place]) 05/14/2018   Thyroid cancer ([place]) 04/30/2018   Obesity hypoventilation syndrome ([place]) 09/24/2017   Palpitations 09/18/2017   Diabetic neuropathy ([place]) 06/04/2017   Vitamin B12 deficiency 03/05/2017   Numbness and tingling in both hands 02/24/2017   OSA (obstructive sleep apnea) 01/31/2017   Liver cirrhosis secondary to NASH ([place]) 01/31/2017   Hypothyroidism 01/31/2017   Chronic narcotic use 01/31/2017   Fibromyalgia 01/31/2017   Hyperlipidemia 01/31/2017   Gastroparesis    Memory disorder 07/25/2016   Lump in neck 01/29/2016   Chronic diastolic (congestive) heart failure ([place]) 01/03/2016   Fatty liver 01/03/2016   Type 1 diabetes mellitus ([place]) 01/03/2016   Recurrent Clostridium difficile diarrhea 01/03/2016   Chronic respiratory failure ([place]) 11/10/2014   Physical deconditioning 09/26/2014   Dyspnea 09/26/2014   Iron deficiency anemia, unspecified  04/05/2011   Bariatric surgery status 04/05/2011   Left arm pain     UNSPECIFIED VITAMIN D DEFICIENCY 10/22/2007   LOW BACK PAIN, CHRONIC 10/22/2007   INSOMNIA 10/22/2007   Obesity 10/14/2007   Chronic pain syndrome 10/14/2007   CARPAL TUNNEL SYNDROME 10/14/2007   ALLERGIC RHINITIS CAUSE UNSPECIFIED 10/14/2007   ARTHRITIS 10/14/2007   Anxiety state 08/25/2007   Depression 08/25/2007   Essential hypertension 08/25/2007   ASTHMA 08/25/2007   CONSTIPATION 08/25/2007   POLYMYALGIA RHEUMATICA 08/25/2007   LEG EDEMA, BILATERAL 08/25/2007    Goals Addressed   None     Follow-Up:  Coordination of Enhanced Pharmacy Services   Reviewed chart for medication changes ahead of medication coordination call.  No OVs, Consults, or hospital visits since last care coordination call/Pharmacist visit.  No medication changes indicated   BP Readings from Last 3 Encounters:  08/29/20 101/65  08/04/20 120/70  07/05/20 130/82    Lab Results  Component  Value Date   HGBA1C 8.1 (A) 07/05/2020     Patient obtains medications through Adherence Packaging  30 Days   Last adherence delivery included:   Metoprolol tartrate 25 mg 1 tab breakfast and 1 tab bedtime Simvastatin 20 mg 1 tab bedtime Amlodipine 5 mg breakfast and 1 tab bedtime Hydralazine 100 mg 1 tab breakfast and 1 tab bedtime Amitriptyline 25 mg 3 tabs bedtime- dose change Hydroxyzine Hcl 25 mg 1 tab breakfast and 1 tab bedtime Levothyroxine 200 mcg 1 tab daily breakfast Paroxetine 40 mg 1 tab bedtime Gabapentin 300 mg 1 cap 3x daily as needed (vial) Furosemide 40 mg 1 tab daily Trazodone 100 mg 1 tab bedtime  Tizanidine 2 mg 1 tab every 8 hours as needed (vial) Ondansetron 4 mg PRN (Vial) Lubiprostone 24 mcg 1 cap twice daily with meals (vial) Naloxone 4 mg one spray in either nostril once for known/suspected opioid overdose, may repeat every 2-3 minutes in alternating nostril til EMS arrives Morphine sulf Er 30 mg; take one tab bid (VIAL)  Morphine sulf ER 15 mg: 1 tab daily (VIAL)    Patient  declined potassium last month due to PRN use/additional supply on hand. Explanation of abundance on hand (ie #30 due to overlapping fills or previous adherence issues etc)  Patient is due for next adherence delivery on: 10/03/2020. Called patient and reviewed medications and coordinated delivery.  This delivery to include: Metoprolol Tartrate 25 mg 1 tab breakfast and 1 tab bedtime Metformin 500 mg BID - 1 tab breakfast and  1 tab bedtime Simvastatin 20 mg 1 tab bedtime Amlodipine 5 mg 1 tab breakfast and 1 tab bedtime Hydralazine 100 mg 1 tab breakfast and 1 tab bedtime Hydroxyzine Hcl 25 mg 1 tab breakfast and bedtime Amitriptyline 25 mg 3 tabs bedtime Levothyroxine 200 mcg 1-tab daily breakfast Paroxetine 40 mg 1 tab bedtime Gabapentin 300 mg 1 cap 3x daily as needed (vial) Furosemide 40 mg 1 tab daily - breakfast Tizanidine 2 mg 1 tab every 8 hrs. as needed (vial) Naloxone 4 mg as needed Meloxicam 7.5 mg 1 tab twice daily for pain (vial) Morphine ER 15 mg #30 (vial) Morphine ER 30 mg #60 (vial) Movantik 25 mg (vial)     Confirmed delivery date of 10/03/2020, advised patient that pharmacy will contact them the morning of delivery.   Wendy Poet, [place] [phone]

## 2020-09-27 DIAGNOSIS — G4733 Obstructive sleep apnea (adult) (pediatric): Secondary | ICD-10-CM | POA: Diagnosis not present

## 2020-09-27 DIAGNOSIS — E1159 Type 2 diabetes mellitus with other circulatory complications: Secondary | ICD-10-CM | POA: Diagnosis not present

## 2020-09-27 DIAGNOSIS — Z794 Long term (current) use of insulin: Secondary | ICD-10-CM | POA: Diagnosis not present

## 2020-09-27 DIAGNOSIS — M797 Fibromyalgia: Secondary | ICD-10-CM | POA: Diagnosis not present

## 2020-09-27 DIAGNOSIS — I509 Heart failure, unspecified: Secondary | ICD-10-CM | POA: Diagnosis not present

## 2020-09-27 DIAGNOSIS — G8929 Other chronic pain: Secondary | ICD-10-CM | POA: Diagnosis not present

## 2020-09-27 DIAGNOSIS — M545 Low back pain, unspecified: Secondary | ICD-10-CM | POA: Diagnosis not present

## 2020-09-27 DIAGNOSIS — R32 Unspecified urinary incontinence: Secondary | ICD-10-CM | POA: Diagnosis not present

## 2020-09-27 DIAGNOSIS — E039 Hypothyroidism, unspecified: Secondary | ICD-10-CM | POA: Diagnosis not present

## 2020-09-27 DIAGNOSIS — D649 Anemia, unspecified: Secondary | ICD-10-CM | POA: Diagnosis not present

## 2020-09-27 DIAGNOSIS — F32A Depression, unspecified: Secondary | ICD-10-CM | POA: Diagnosis not present

## 2020-09-27 DIAGNOSIS — I110 Hypertensive heart disease with heart failure: Secondary | ICD-10-CM | POA: Diagnosis not present

## 2020-09-27 DIAGNOSIS — F419 Anxiety disorder, unspecified: Secondary | ICD-10-CM | POA: Diagnosis not present

## 2020-09-27 DIAGNOSIS — E669 Obesity, unspecified: Secondary | ICD-10-CM | POA: Diagnosis not present

## 2020-09-27 DIAGNOSIS — E785 Hyperlipidemia, unspecified: Secondary | ICD-10-CM | POA: Diagnosis not present

## 2020-09-27 DIAGNOSIS — J9610 Chronic respiratory failure, unspecified whether with hypoxia or hypercapnia: Secondary | ICD-10-CM | POA: Diagnosis not present

## 2020-09-27 DIAGNOSIS — Z6841 Body Mass Index (BMI) 40.0 and over, adult: Secondary | ICD-10-CM | POA: Diagnosis not present

## 2020-09-27 DIAGNOSIS — Z7984 Long term (current) use of oral hypoglycemic drugs: Secondary | ICD-10-CM | POA: Diagnosis not present

## 2020-09-27 DIAGNOSIS — E1140 Type 2 diabetes mellitus with diabetic neuropathy, unspecified: Secondary | ICD-10-CM | POA: Diagnosis not present

## 2020-09-27 DIAGNOSIS — I471 Supraventricular tachycardia: Secondary | ICD-10-CM | POA: Diagnosis not present

## 2020-09-27 DIAGNOSIS — Z9981 Dependence on supplemental oxygen: Secondary | ICD-10-CM | POA: Diagnosis not present

## 2020-09-27 DIAGNOSIS — Z7982 Long term (current) use of aspirin: Secondary | ICD-10-CM | POA: Diagnosis not present

## 2020-09-27 DIAGNOSIS — E538 Deficiency of other specified B group vitamins: Secondary | ICD-10-CM | POA: Diagnosis not present

## 2020-09-27 DIAGNOSIS — J45909 Unspecified asthma, uncomplicated: Secondary | ICD-10-CM | POA: Diagnosis not present

## 2020-09-27 NOTE — Telephone Encounter (Signed)
Referral was sent.

## 2020-09-28 ENCOUNTER — Encounter: Payer: Self-pay | Admitting: Psychology

## 2020-09-28 DIAGNOSIS — F419 Anxiety disorder, unspecified: Secondary | ICD-10-CM | POA: Diagnosis not present

## 2020-09-28 DIAGNOSIS — R69 Illness, unspecified: Secondary | ICD-10-CM | POA: Diagnosis not present

## 2020-09-30 NOTE — Addendum Note (Signed)
Addended by: Charlton Haws on: 09/30/2020 11:34 AM   Modules accepted: Orders

## 2020-10-04 ENCOUNTER — Telehealth: Payer: Medicare HMO

## 2020-10-05 ENCOUNTER — Other Ambulatory Visit: Payer: Self-pay

## 2020-10-05 ENCOUNTER — Ambulatory Visit (INDEPENDENT_AMBULATORY_CARE_PROVIDER_SITE_OTHER): Payer: Medicare HMO | Admitting: Pharmacist

## 2020-10-05 VITALS — Wt 267.0 lb

## 2020-10-05 DIAGNOSIS — F32A Depression, unspecified: Secondary | ICD-10-CM

## 2020-10-05 DIAGNOSIS — E039 Hypothyroidism, unspecified: Secondary | ICD-10-CM | POA: Diagnosis not present

## 2020-10-05 DIAGNOSIS — R69 Illness, unspecified: Secondary | ICD-10-CM | POA: Diagnosis not present

## 2020-10-05 DIAGNOSIS — E782 Mixed hyperlipidemia: Secondary | ICD-10-CM | POA: Diagnosis not present

## 2020-10-05 DIAGNOSIS — I10 Essential (primary) hypertension: Secondary | ICD-10-CM

## 2020-10-05 DIAGNOSIS — E1165 Type 2 diabetes mellitus with hyperglycemia: Secondary | ICD-10-CM

## 2020-10-05 DIAGNOSIS — Z794 Long term (current) use of insulin: Secondary | ICD-10-CM

## 2020-10-05 DIAGNOSIS — F411 Generalized anxiety disorder: Secondary | ICD-10-CM

## 2020-10-05 DIAGNOSIS — G894 Chronic pain syndrome: Secondary | ICD-10-CM

## 2020-10-05 DIAGNOSIS — E1159 Type 2 diabetes mellitus with other circulatory complications: Secondary | ICD-10-CM | POA: Diagnosis not present

## 2020-10-05 DIAGNOSIS — IMO0002 Reserved for concepts with insufficient information to code with codable children: Secondary | ICD-10-CM

## 2020-10-05 NOTE — Progress Notes (Signed)
Mary Acosta! Thank you! CG

## 2020-10-05 NOTE — Patient Instructions (Signed)
Visit Information  Phone number for Pharmacist: [phone]  Goals Addressed            This Visit's Progress   . Manage My Medicine       Timeframe:  Long-Range Goal Priority:  High Start Date:      10/05/20                       Expected End Date:    04/04/21                   Follow Up Date 01/23/21   - call for medicine refill 2 or 3 days before it runs out - call if I am sick and can't take my medicine - keep a list of all the medicines I take; vitamins and herbals too - use [facility] for pill packaging - contact [name] with any medication issues   Why is this important?   . These steps will help you keep on track with your medicines.     . COMPLETED: Pharmacy Care Plan       CARE PLAN ENTRY (see longitudinal plan of care for additional care plan information)  Current Barriers:  . Chronic Disease Management support, education, and care coordination needs related to Hypertension, Hyperlipidemia, Diabetes, Heart Failure, Depression, and Anxiety   Hypertension / Diastolic heart failure BP Readings from Last 3 Encounters:  05/03/20 (!) 146/84  04/13/20 (!) 142/84  03/01/20 124/70 .  Pharmacist Clinical Goal(s): o Over the next 180 days, patient will work with PharmD and providers to maintain BP goal <130/80 . Current regimen:  o amlodipine 5 mg twice a day o spironolactone 25 mg daily as needed for high BP o Hydralazine 100 mg twice a day o metoprolol tartrate 25 mg twice a day o Furosemide 20 mg as needed for swelling . Interventions: o Discussed benefits of medications and importance of adherence  . Patient self care activities - Over the next 180 days, patient will: o Check BP daily, document, and provide at future appointments o Ensure daily salt intake < 2300 mg/day  Hyperlipidemia Lab Results  Component Value Date/Time   LDLCALC 135 (H) 02/17/2020 11:16 AM .  Pharmacist Clinical Goal(s): o Over the next 180 days, patient will work with PharmD  and providers to achieve LDL goal < 100 . Current regimen:  o Simvastatin 20 mg daily  . Interventions: o Discussed cholesterol goals and benefits of medications for prevention of heart attack / stroke o Discussed statins are safe and effective in liver cirrhosis due to NASH . Patient self care activities - Over the next 180 days, patient will: o Continue low cholesterol diet o Continue medication as prescribed  Diabetes Lab Results  Component Value Date/Time   HGBA1C 10.4 (H) 05/03/2020 03:17 PM   HGBA1C 11.0 (H) 10/22/2019 02:11 PM .  Pharmacist Clinical Goal(s): o Over the next 30 days, patient will work with PharmD and providers to achieve A1c goal <7% and fasting blood sugar 80-130 . Current regimen:  o Basaglar 80 units twice a day o Rybelsus 3 mg daily (plan to titrate to 7 mg after 30 days) . 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 Patient self care activities - Over the next 30 days, patient will: o Check blood sugar twice daily, document, and provide at future appointments o Contact  provider with any episodes of hypoglycemia  Chronic pain / Arthritis . Pharmacist Clinical Goal(s) o Over the next 30 days, patient will work with PharmD and providers to optimize therapy and reduce cost . Current regimen:  o morphine ER 30 mg every 12 hours o celecoxib 100 mg twice a day o Tylenol 1000 mg twice a day o tizanidine 2 mg every 6 hrs as needed o Gabapentin 300 mg 3 times daily as needed . Interventions: o Tier exception approved for celecoxib and gabapentin through 08/25/2020 . Patient self care activities - Over the next 30 days, patient will: o Continue medication as prescribed o Discuss reducing morphine dose with [facility] pain management  Medication management . Pharmacist Clinical Goal(s): o Over the next 180 days, patient  will work with PharmD and providers to achieve optimal medication adherence . Current pharmacy: [facility] . Interventions o Comprehensive medication review performed. o Utilize [facility] pharmacy for medication synchronization, packaging and delivery . Patient self care activities - Over the next 30 days, patient will: o Focus on medication adherence by pill pack o Take medications as prescribed o Report any questions or concerns to PharmD and/or provider(s)  Please see past updates related to this goal by clicking on the "Past Updates" button in the selected goal       Patient Care Plan: CCM Pharmacy Care Plan    Problem Identified: Hypertension, Hyperlipidemia, Diabetes, Hypothyroidism, Depression, Anxiety and Pain management   Priority: High    Long-Range Goal: Disease management   Start Date: 10/05/2020  Expected End Date: 04/04/2021  This Visit's Progress: On track  Priority: High  Note:   Current Barriers:  . Unable to independently afford treatment regimen . Unable to independently monitor therapeutic efficacy . Unable to maintain control of diabetes  Pharmacist Clinical Goal(s):  Marland Kitchen Over the next 90 days, patient will verbalize ability to afford treatment regimen . achieve adherence to monitoring guidelines and medication adherence to achieve therapeutic efficacy . maintain control of diabetes as evidenced by improvement in blood sugar  through collaboration with PharmD and provider.   Interventions: . 1:1 collaboration with Binnie Rail, MD regarding development and update of comprehensive plan of care as evidenced by provider attestation and co-signature . Inter-disciplinary care team collaboration (see longitudinal plan of care) . Comprehensive medication review performed; medication list updated in electronic medical record  Hypertension / Diastolic heart failure (BP goal < 130/80) 1. Controlled - per pt report 2. Current regimen:  ? amlodipine 5 mg twice a  day ? spironolactone 25 mg daily as needed for high BP ? Hydralazine 100 mg twice a day ? metoprolol tartrate 25 mg twice a day ? Furosemide 20 mg as needed for swelling 3. Interventions: ? Discussed benefits of medications and importance of adherence  1. Patient self care activities ? Check BP daily, document, and provide at future appointments ? Ensure daily salt intake < 2300 mg/day   Hyperlipidemia (LDL goal < 70) 1. Controlled - most recent LDL was below goal 2. Current regimen:  ? Simvastatin 20 mg daily  ? Aspirin 81 mg daily 1. Interventions: ? Discussed cholesterol goals and benefits of medications for prevention of heart attack / stroke ? Previously stopped ezetimibe due to patient complaints of muscle aches with combination simvastatin + ezetimibe. 1. Patient self care activities  ? Continue low cholesterol diet ? Continue medication as prescribed   Diabetes (A1c goal < 7%) 1. Uncontrolled - but improving with recent additions of metformin and Rybelsus,  Patient has lost 13 lbs since most recent Jan 4th appt (now 267 lbs) 2. Current regimen:  ? Basaglar 80 units twice a day ? Metformin 500 mg BID ? Rybelsus 7 mg daily 1. Interventions: ? Discussed A1c goal and benefits of maintaining blood sugar at goal ? Basaglar approved via [employer] through 08/25/21 ? Rybelsus approved via [employer] through 08/25/21 ? Recommend to increase Rybelsus to 14 mg after completing 1 month of 7 mg. Discussed BG and weight should continue to improve on higher dose, but if she cannot tolerate 14 mg we can return to 7 mg.  1. Patient self care activities - ? Check blood sugar twice daily, document, and provide at future appointments ? Contact provider with any episodes of hypoglycemia ? Increase Rybelsus to 14 mg after completing 1 month of 7 mg dose. If unable to tolerate 14 mg, may return to 7 mg dose.   Chronic pain / Arthritis 1. Relatively controlled currently - per pt  report 2. Current regimen:  ? morphine ER 30 mg every 12 hours ? Morphine IR 15 mg daily ? Tylenol 1000 mg twice a day ? tizanidine 2 mg every 6 hrs as needed ? Gabapentin 300 mg 3 times daily as needed - pt tries to limit use as it causes leg swelling ? Meloxicam 7.5 mg daily 1. Interventions: ? Discussed risk for oversedation given multiple sedating medications. Pt denies this issue and follows with [facility] pain management regularly. 1. Patient self care activities ? Continue medication as prescribed ? Discuss reducing morphine dose with [facility] pain management  Depression/Anxiety/Insomnia . Current regimen:  o Paroxetine 40 mg daily o Amitriptyline 25 mg - 3 tab HS o Trazodone 100 mg HS o Hydroxyzine 25 mg BID prn  . Interventions: o Discussed benefits of medications and potential for oversedation with combination of amitriptyline, trazodone and hydroxyzine as well as opioid medications. Pt denies oversedation currently . Patient self care activities o Continue current medications and monitor for oversedation  Hypothyroidism (goal: maintain TSH in goal range) . Current regimen:  o Levothyroxine 200 mcg daily except 1/2 tab on Sundays . Interventions: o Discussed recent low TSH indicating hyperthyroid state; dose was reduced by Dr Cruzita Lederer and planning to recheck TSH at upcoming appt . Patient self care activities  o Continue current medication and follow up with Dr Cruzita Lederer as scheduled  Patient Goals/Self-Care Activities . Over the next 90 days, patient will:  - take medications as prescribed focus on medication adherence by pill packs check glucose twice daily, document, and provide at future appointments check blood pressure daily, document, and provide at future appointments collaborate with provider on medication access solutions  Follow Up Plan: Telephone follow up appointment with care management team member scheduled for: 3 months     The patient verbalized understanding  of instructions, educational materials, and care plan provided today and declined offer to receive copy of patient instructions, educational materials, and care plan.  Telephone follow up appointment with pharmacy team member scheduled for: 3 months  Charlene Brooke, PharmD, [facility] Clinical Pharmacist [facility] [phone]

## 2020-10-05 NOTE — Progress Notes (Signed)
Chronic Care Management Pharmacy Note  10/05/2020 Name:  Mary Acosta MRN:  [id number] DOB:  09-Nov-1955  Subjective: Mary Acosta is an 65 y.o. year old female who is a primary patient of Burns, Claudina Lick, MD.  The CCM team was consulted for assistance with disease management and care coordination needs.    Engaged with patient by telephone for follow up visit in response to provider referral for pharmacy case management and/or care coordination services.   Consent to Services:  The patient was given the following information about Chronic Care Management services today, agreed to services, and gave verbal consent: 1. CCM service includes personalized support from designated clinical staff supervised by the primary care provider, including individualized plan of care and coordination with other care providers 2. 24/7 contact phone numbers for assistance for urgent and routine care needs. 3. Service will only be billed when office clinical staff spend 20 minutes or more in a month to coordinate care. 4. Only one practitioner may furnish and bill the service in a calendar month. 5.The patient may stop CCM services at any time (effective at the end of the month) by phone call to the office staff. 6. The patient will be responsible for cost sharing (co-pay) of up to 20% of the service fee (after annual deductible is met). Patient agreed to services and consent obtained.  Patient Care Team: Binnie Rail, MD as PCP - General (Internal Medicine) Stanford Breed Denice Bors, MD as PCP - Cardiology (Cardiology) Charlton Haws, [facility] as Pharmacist (Pharmacist)  Recent office visits: 09/01/20 Dr Quay Burow VV: URI, rx'd Z-pak.  Recent consult visits: 08/29/20 NP Ward Givens (neurology): f/u neuropathy, fibromyalgia, memory issues. She stopped donepezil previously due to cost. Memory score has declined since 2019. Ordered neuropsych testing before prescribing medication.  08/28/20 Dr Aram Candela [facility]  pain mgmt): refilled morphine, continue meloxicam, gabapentin, tizanidine. Restart Amitiza BID. Decrease amitriptyline to 75 mg HS.  08/09/20 Leonia Reader (Nutrition): rec'd multivitamin. Add protein to breakfast. Stop regular Kool-aid, get unsweetened. Try flavored waters.  08/04/20 Dr Cruzita Lederer (endocrine): switched Rybelsus to [place] temporarily until PAP approved. Reduced levothyroxine to 200 mcg x 6 days with 100 mcg (1/2 tab) on 7th day.  07/05/20 Dr Cruzita Lederer (endocrine): started metformin 500 mg BID.  Hospital visits: None in previous 6 months  Objective:  Lab Results  Component Value Date   CREATININE 0.97 05/03/2020   BUN 14 05/03/2020   GFR 57.25 (L) 10/22/2019   GFRNONAA 62 05/03/2020   GFRAA 72 05/03/2020   NA 138 05/03/2020   K 4.8 05/03/2020   CALCIUM 8.9 05/03/2020   CO2 34 (H) 05/03/2020    Lab Results  Component Value Date/Time   HGBA1C 8.1 (A) 07/05/2020 12:06 PM   HGBA1C 10.4 (H) 05/03/2020 03:17 PM   HGBA1C 11.0 (H) 10/22/2019 02:11 PM   GFR 57.25 (L) 10/22/2019 02:11 PM   GFR 65.74 07/20/2019 01:51 PM   MICROALBUR <0.7 09/19/2017 02:26 PM   MICROALBUR 0.8 02/05/2017 11:09 AM    Last diabetic Eye exam:  Lab Results  Component Value Date/Time   HMDIABEYEEXA No Retinopathy 05/29/2016 12:00 AM    Last diabetic Foot exam: No results found for: HMDIABFOOTEX   Lab Results  Component Value Date   CHOL 126 05/03/2020   HDL 58 05/03/2020   LDLCALC 55 05/03/2020   TRIG 54 05/03/2020   CHOLHDL 2.2 05/03/2020    Hepatic Function Latest Ref Rng & Units 05/03/2020 02/17/2020 10/22/2019  Total  Protein 6.1 - 8.1 g/dL 7.1 6.6 8.0  Albumin 3.8 - 4.8 g/dL - 3.9 4.2  AST 10 - 35 U/L '22 25 26  ' ALT 6 - 29 U/L '16 12 18  ' Alk Phosphatase 48 - 121 IU/L - 115 114  Total Bilirubin 0.2 - 1.2 mg/dL 0.5 0.3 0.7  Bilirubin, Direct 0.00 - 0.40 mg/dL - 0.13 -    Lab Results  Component Value Date/Time   TSH 0.14 (L) 08/04/2020 11:57 AM   TSH 4.42 05/03/2020 03:17 PM    FREET4 1.59 08/04/2020 11:57 AM   FREET4 0.93 12/26/2017 03:41 PM    CBC Latest Ref Rng & Units 05/03/2020 10/22/2019 07/20/2019  WBC 3.8 - 10.8 Thousand/uL 8.9 7.8 7.8  Hemoglobin 11.7 - 15.5 g/dL 12.2 13.7 12.8  Hematocrit 35.0 - 45.0 % 39.9 43.1 40.5  Platelets 140 - 400 Thousand/uL 174 144.0(L) 176.0    No results found for: VD25OH  Clinical ASCVD: Yes  The ASCVD Risk score Mikey Bussing DC Jr., et al., 2013) failed to calculate for the following reasons:   The patient has a prior MI or stroke diagnosis    Depression screen [facility] 2/9 01/28/2020 09/04/2018 06/13/2017  Decreased Interest 1 0 0  Down, Depressed, Hopeless 0 0 0  PHQ - 2 Score 1 0 0  Some recent data might be hidden     Social History   Tobacco Use  Smoking Status Former Smoker  . Packs/day: 2.50  . Years: 39.00  . Pack years: 97.50  . Types: Cigarettes  . Quit date: 10/22/2010  . Years since quitting: 9.9  Smokeless Tobacco Never Used   BP Readings from Last 3 Encounters:  08/29/20 101/65  08/04/20 120/70  07/05/20 130/82   Pulse Readings from Last 3 Encounters:  08/29/20 68  08/04/20 73  07/05/20 83   Wt Readings from Last 3 Encounters:  10/05/20 267 lb (121.1 kg)  08/29/20 279 lb 8 oz (126.8 kg)  08/10/20 287 lb (130.2 kg)    Assessment/Interventions: Review of patient past medical history, allergies, medications, health status, including review of consultants reports, laboratory and other test data, was performed as part of comprehensive evaluation and provision of chronic care management services.   SDOH:  (Social Determinants of Health) assessments and interventions performed: Yes SDOH Interventions   Flowsheet Row Most Recent Value  SDOH Interventions   Financial Strain Interventions Other (Comment)  [Rybelsus PAP approved through 08/25/21. Re-enrolling in [place] PAP for 2022.]      CCM Care Plan  Allergies  Allergen Reactions  . Gabapentin Swelling    Swelling in legs Swelling in  legs Swelling in legs  . Losartan Other (See Comments)    Myalgias and muscle cramping Other reaction(s): Other (See Comments) Myalgias and muscle cramping Myalgias and muscle cramping Other reaction(s): Other (See Comments) Myalgias and muscle cramping  . Aricept [Donepezil Hcl]     Nausea/vomiting, low BP  . Oxycodone Itching  . Sulfa Antibiotics Nausea Only and Rash  . Sulfonamide Derivatives Nausea Only    Medications Reviewed Today    Reviewed by Charlton Haws, [facility] (Pharmacist) on 09/30/20 at 1134  Med List Status: <None>  Medication Order Taking? Sig Documenting Provider Last Dose Status Informant  acetaminophen (TYLENOL) 500 MG tablet [id number] Yes Take 1,000 mg by mouth 2 (two) times daily. [provider] Taking Active Self  albuterol (VENTOLIN HFA) 108 (90 Base) MCG/ACT inhaler [id number] Yes TAKE 2 PUFFS BY MOUTH EVERY 6 HOURS AS NEEDED FOR WHEEZE  OR SHORTNESS OF BREATH Burns, Claudina Lick, MD Taking Active Self  amitriptyline (ELAVIL) 25 MG tablet [id number] Yes Take 75 mg by mouth at bedtime. [provider] Taking Active   amLODipine (NORVASC) 5 MG tablet [id number] Yes TAKE ONE TABLET BY MOUTH EVERY MORNING and TAKE ONE TABLET BY MOUTH EVERYDAY AT BEDTIME Binnie Rail, MD Taking Active   aspirin EC 81 MG tablet [id number] Yes Take 1 tablet (81 mg total) by mouth daily. Lelon Perla, MD Taking Active Self  furosemide (LASIX) 40 MG tablet [id number] Yes TAKE ONE TABLET BY MOUTH dAILY Burns, Claudina Lick, MD Taking Active   gabapentin (NEURONTIN) 300 MG capsule [id number] Yes Take 300 mg by mouth 3 (three) times daily.  [provider] Taking Active Self           Med Note Jacobo Forest, Leilani Able   Tue Aug 29, 2020  7:46 AM) As needed  hydrALAZINE (APRESOLINE) 100 MG tablet [id number] Yes TAKE ONE TABLET BY MOUTH EVERY MORNING and TAKE ONE TABLET BY MOUTH EVERYDAY AT BEDTIME Binnie Rail, MD Taking Active   hydrOXYzine (ATARAX/VISTARIL) 25 MG tablet  [id number] Yes Take 1 tablet (25 mg total) by mouth in the morning and at bedtime. Binnie Rail, MD Taking Active   insulin glargine (LANTUS) 100 UNIT/ML injection [id number] Yes Inject 1.8 mLs (180 Units total) into the skin daily.  Patient taking differently: Inject 80 Units into the skin daily.   Binnie Rail, MD Taking Active            Med Note Luna Glasgow Feb 14, 2020  4:47 PM) Nancee Liter via [facility] Cares PAP  Insulin Pen Needle (PEN NEEDLES) 31G X 8 MM MISC [id number] Yes 1 Device by Does not apply route daily. Renato Shin, MD Taking Active   Insulin Syringe-Needle U-100 26G X 1/2" 1 ML MISC [id number] Yes Use daily for insulin injection as directed Burns, Claudina Lick, MD Taking Active   ipratropium-albuterol (DUONEB) 0.5-2.5 (3) MG/3ML SOLN [id number] Yes Take 3 mLs by nebulization every 6 (six) hours as needed (Wheezing or dyspnea.). Brand Males, MD Taking Active   levothyroxine (SYNTHROID) 200 MCG tablet [id number] Yes Take 1 tablet (200 mcg total) by mouth daily before breakfast. Binnie Rail, MD Taking Active   meloxicam (MOBIC) 7.5 MG tablet [id number] Yes Take 7.5 mg by mouth daily. [provider] Taking Active   metFORMIN (GLUCOPHAGE) 500 MG tablet [id number] Yes Take 1 tablet (500 mg total) by mouth 2 (two) times daily with a meal. Philemon Kingdom, MD Taking Active   metoprolol tartrate (LOPRESSOR) 25 MG tablet [id number] Yes Take 1 tablet (25 mg total) by mouth 2 (two) times daily. Lelon Perla, MD Taking Active   morphine (MS CONTIN) 15 MG 12 hr tablet [id number] Yes Take 15 mg by mouth daily. [provider] Taking Active   morphine (MS CONTIN) 30 MG 12 hr tablet [id number] Yes Take 30 mg by mouth 2 (two) times daily. [provider] Taking Active Self           Med Note Rosemarie Beath, MELISSA B   Sun Dec 03, 2015  3:39 PM)    naloxone [facility]) nasal spray 4 mg/0.1 mL [id number] Yes One spray in either nostril once for known/suspected opioid  overdose. May repeat every 2-3 minutes in alternating nostril til EMS arrives [provider] Taking Active   ondansetron (ZOFRAN-ODT) 4 MG disintegrating tablet [id number] Yes Take 1 tablet (4 mg total)  by mouth every 6 (six) hours as needed for nausea or vomiting. Doran Stabler, MD Taking Active   PARoxetine (PAXIL) 40 MG tablet [id number] Yes Take 1 tablet (40 mg total) by mouth daily. Binnie Rail, MD Taking Active   potassium chloride SA (KLOR-CON M20) 20 MEQ tablet [id number] Yes TAKE 2 TABLETS EVERY DAY AS NEEDED FOR CRAMPING Binnie Rail, MD Taking Active            Med Note Linus Orn Aug 29, 2020  7:47 AM) 08/29/20 very rarely  Semaglutide (RYBELSUS) 7 MG TABS [id number] Yes Take 7 mg by mouth daily. 30 min before breakfast with 4 oz water [provider] Taking Active   simvastatin (ZOCOR) 20 MG tablet [id number] Yes TAKE ONE TABLET BY MOUTH EVERYDAY AT BEDTIME Kerin Ransom K, PA-C Taking Active   tiZANidine (ZANAFLEX) 2 MG tablet [id number] Yes Take 2 mg by mouth as needed for muscle spasms.  [provider] Taking Active Self           Med Note Spero Curb, GREG A   Mon Jul 10, 2015  4:55 PM)    traZODone (DESYREL) 100 MG tablet [id number] Yes TAKE ONE TABLET BY MOUTH EVERYDAY AT BEDTIME [provider] Taking Active           Patient Active Problem List   Diagnosis Date Noted  . URI (upper respiratory infection) 09/01/2020  . Uncontrolled type 2 diabetes mellitus with circulatory disorder, with long-term current use of insulin ([place]) 07/05/2020  . PSVT (paroxysmal supraventricular tachycardia) ([place]) 05/31/2020  . UTI (urinary tract infection) 03/01/2020  . Grief 02/03/2019  . Nausea 01/04/2019  . Reactive airway disease 10/21/2018  . Arthralgia 09/04/2018  . Narcotic overdose ([place]) 05/14/2018  . Thyroid cancer ([place]) 04/30/2018  . Obesity hypoventilation syndrome ([place]) 09/24/2017  . Palpitations 09/18/2017  . Diabetic neuropathy  ([place]) 06/04/2017  . Vitamin B12 deficiency 03/05/2017  . Numbness and tingling in both hands 02/24/2017  . OSA (obstructive sleep apnea) 01/31/2017  . Liver cirrhosis secondary to NASH ([place]) 01/31/2017  . Hypothyroidism 01/31/2017  . Chronic narcotic use 01/31/2017  . Fibromyalgia 01/31/2017  . Hyperlipidemia 01/31/2017  . Gastroparesis   . Memory disorder 07/25/2016  . Lump in neck 01/29/2016  . Chronic diastolic (congestive) heart failure ([place]) 01/03/2016  . Fatty liver 01/03/2016  . Type 1 diabetes mellitus ([place]) 01/03/2016  . Recurrent Clostridium difficile diarrhea 01/03/2016  . Chronic respiratory failure ([place]) 11/10/2014  . Physical deconditioning 09/26/2014  . Dyspnea 09/26/2014  . Iron deficiency anemia, unspecified  04/05/2011  . Bariatric surgery status 04/05/2011  . Left arm pain   . UNSPECIFIED VITAMIN D DEFICIENCY 10/22/2007  . LOW BACK PAIN, CHRONIC 10/22/2007  . INSOMNIA 10/22/2007  . Obesity 10/14/2007  . Chronic pain syndrome 10/14/2007  . CARPAL TUNNEL SYNDROME 10/14/2007  . ALLERGIC RHINITIS CAUSE UNSPECIFIED 10/14/2007  . ARTHRITIS 10/14/2007  . Anxiety state 08/25/2007  . Depression 08/25/2007  . Essential hypertension 08/25/2007  . ASTHMA 08/25/2007  . CONSTIPATION 08/25/2007  . POLYMYALGIA RHEUMATICA 08/25/2007  . LEG EDEMA, BILATERAL 08/25/2007    Immunization History  Administered Date(s) Administered  . Hep A / Hep B 02/05/2017  . Hepatitis B, adult 03/05/2017, 08/14/2017  . Influenza,inj,Quad PF,6+ Mos 05/27/2015, 05/30/2016, 06/13/2017, 05/05/2018, 05/22/2019, 05/03/2020  . Influenza,inj,quad, With Preservative 04/26/2018, 05/24/2019  . Influenza-Unspecified 05/26/2014  . Moderna Sars-Covid-2 Vaccination 10/05/2019, 11/02/2019  . Pneumococcal Conjugate-13 10/23/2015  . Pneumococcal Polysaccharide-23 07/10/2014  Conditions to be addressed/monitored:  Hypertension, Hyperlipidemia, Diabetes, Hypothyroidism, Depression, Anxiety and Pain  management  Care Plan : CCM Pharmacy Care Plan  Updates made by Charlton Haws, [place] since 10/05/2020 12:00 AM    Problem: Hypertension, Hyperlipidemia, Diabetes, Hypothyroidism, Depression, Anxiety and Pain management   Priority: High    Long-Range Goal: Disease management   Start Date: 10/05/2020  Expected End Date: 04/04/2021  This Visit's Progress: On track  Priority: High  Note:   Current Barriers:  . Unable to independently afford treatment regimen . Unable to independently monitor therapeutic efficacy . Unable to maintain control of diabetes  Pharmacist Clinical Goal(s):  Marland Kitchen Over the next 90 days, patient will verbalize ability to afford treatment regimen . achieve adherence to monitoring guidelines and medication adherence to achieve therapeutic efficacy . maintain control of diabetes as evidenced by improvement in blood sugar  through collaboration with PharmD and provider.   Interventions: . 1:1 collaboration with Binnie Rail, MD regarding development and update of comprehensive plan of care as evidenced by provider attestation and co-signature . Inter-disciplinary care team collaboration (see longitudinal plan of care) . Comprehensive medication review performed; medication list updated in electronic medical record  Hypertension / Diastolic heart failure (BP goal < 130/80) 1. Controlled - per pt report 2. Current regimen:  ? amlodipine 5 mg twice a day ? spironolactone 25 mg daily as needed for high BP ? Hydralazine 100 mg twice a day ? metoprolol tartrate 25 mg twice a day ? Furosemide 20 mg as needed for swelling 3. Interventions: ? Discussed benefits of medications and importance of adherence  1. Patient self care activities ? Check BP daily, document, and provide at future appointments ? Ensure daily salt intake < 2300 mg/day   Hyperlipidemia (LDL goal < 70) 1. Controlled - most recent LDL was below goal 2. Current regimen:  ? Simvastatin 20 mg daily   ? Aspirin 81 mg daily 1. Interventions: ? Discussed cholesterol goals and benefits of medications for prevention of heart attack / stroke ? Previously stopped ezetimibe due to patient complaints of muscle aches with combination simvastatin + ezetimibe. 1. Patient self care activities  ? Continue low cholesterol diet ? Continue medication as prescribed   Diabetes (A1c goal < 7%) 1. Uncontrolled - but improving with recent additions of metformin and Rybelsus, Patient has lost 13 lbs since most recent Jan 4th appt (now 267 lbs) 2. Current regimen:  ? Basaglar 80 units twice a day ? Metformin 500 mg BID ? Rybelsus 7 mg daily 1. Interventions: ? Discussed A1c goal and benefits of maintaining blood sugar at goal ? Basaglar approved via [employer] through 08/25/21 ? Rybelsus approved via [place] through 08/25/21 ? Recommend to increase Rybelsus to 14 mg after completing 1 month of 7 mg. Discussed BG and weight should continue to improve on higher dose, but if she cannot tolerate 14 mg we can return to 7 mg.  1. Patient self care activities - ? Check blood sugar twice daily, document, and provide at future appointments ? Contact provider with any episodes of hypoglycemia ? Increase Rybelsus to 14 mg after completing 1 month of 7 mg dose. If unable to tolerate 14 mg, may return to 7 mg dose.   Chronic pain / Arthritis 1. Relatively controlled currently - per pt report 2. Current regimen:  ? morphine ER 30 mg every 12 hours ? Morphine IR 15 mg daily ? Tylenol 1000 mg twice a day ? tizanidine 2  mg every 6 hrs as needed ? Gabapentin 300 mg 3 times daily as needed - pt tries to limit use as it causes leg swelling ? Meloxicam 7.5 mg daily 1. Interventions: ? Discussed risk for oversedation given multiple sedating medications. Pt denies this issue and follows with [facility] pain management regularly. 1. Patient self care activities ? Continue medication as prescribed ? Discuss reducing morphine  dose with [facility] pain management  Depression/Anxiety/Insomnia . Current regimen:  o Paroxetine 40 mg daily o Amitriptyline 25 mg - 3 tab HS o Trazodone 100 mg HS o Hydroxyzine 25 mg BID prn  . Interventions: o Discussed benefits of medications and potential for oversedation with combination of amitriptyline, trazodone and hydroxyzine as well as opioid medications. Pt denies oversedation currently . Patient self care activities o Continue current medications and monitor for oversedation  Hypothyroidism (goal: maintain TSH in goal range) . Current regimen:  o Levothyroxine 200 mcg daily except 1/2 tab on Sundays . Interventions: o Discussed recent low TSH indicating hyperthyroid state; dose was reduced by Dr Cruzita Lederer and planning to recheck TSH at upcoming appt . Patient self care activities  o Continue current medication and follow up with Dr Cruzita Lederer as scheduled  Patient Goals/Self-Care Activities . Over the next 90 days, patient will:  - take medications as prescribed focus on medication adherence by pill packs check glucose twice daily, document, and provide at future appointments check blood pressure daily, document, and provide at future appointments collaborate with provider on medication access solutions  Follow Up Plan: Telephone follow up appointment with care management team member scheduled for: 3 months      Medication Assistance: Rybelsus obtained through [place] medication assistance program.  Enrollment ends 08/25/21  Basaglar obtained through [employer] medication assistance program.  Enrollment ends 08/25/20 - pt has already sent in re-enrollment paperwork.  Patient's preferred pharmacy is:  [facility] - [place], [place] - [address] [place] [address] Phone: [phone] Fax: [phone]  Uses pill box? Yes - pill packs Pt endorses 100% compliance  We discussed: Reviewed patient's [facility]  medication and Epic medication profile assuring there are no discrepancies or gaps in therapy. Confirmed all fill dates appropriate and verified with patient that there is a sufficient quantity of all prescribed medications at home. Informed patient to call me any time if needing medications before scheduled deliveries.   Patient decided to: Utilize [facility] pharmacy for medication synchronization, packaging and delivery  Care Plan and Follow Up Patient Decision:  Patient agrees to Care Plan and Follow-up.  Plan: Telephone follow up appointment with care management team member scheduled for:  3 months  Charlene Brooke, PharmD, [facility] Clinical Pharmacist [facility] [phone]

## 2020-10-09 DIAGNOSIS — F32A Depression, unspecified: Secondary | ICD-10-CM | POA: Diagnosis not present

## 2020-10-09 DIAGNOSIS — I110 Hypertensive heart disease with heart failure: Secondary | ICD-10-CM | POA: Diagnosis not present

## 2020-10-09 DIAGNOSIS — Z7982 Long term (current) use of aspirin: Secondary | ICD-10-CM | POA: Diagnosis not present

## 2020-10-09 DIAGNOSIS — M545 Low back pain, unspecified: Secondary | ICD-10-CM | POA: Diagnosis not present

## 2020-10-09 DIAGNOSIS — R32 Unspecified urinary incontinence: Secondary | ICD-10-CM | POA: Diagnosis not present

## 2020-10-09 DIAGNOSIS — G8929 Other chronic pain: Secondary | ICD-10-CM | POA: Diagnosis not present

## 2020-10-09 DIAGNOSIS — E785 Hyperlipidemia, unspecified: Secondary | ICD-10-CM | POA: Diagnosis not present

## 2020-10-09 DIAGNOSIS — Z7984 Long term (current) use of oral hypoglycemic drugs: Secondary | ICD-10-CM | POA: Diagnosis not present

## 2020-10-09 DIAGNOSIS — I509 Heart failure, unspecified: Secondary | ICD-10-CM | POA: Diagnosis not present

## 2020-10-09 DIAGNOSIS — J9610 Chronic respiratory failure, unspecified whether with hypoxia or hypercapnia: Secondary | ICD-10-CM | POA: Diagnosis not present

## 2020-10-09 DIAGNOSIS — E538 Deficiency of other specified B group vitamins: Secondary | ICD-10-CM | POA: Diagnosis not present

## 2020-10-09 DIAGNOSIS — I471 Supraventricular tachycardia: Secondary | ICD-10-CM | POA: Diagnosis not present

## 2020-10-09 DIAGNOSIS — E1159 Type 2 diabetes mellitus with other circulatory complications: Secondary | ICD-10-CM | POA: Diagnosis not present

## 2020-10-09 DIAGNOSIS — J45909 Unspecified asthma, uncomplicated: Secondary | ICD-10-CM | POA: Diagnosis not present

## 2020-10-09 DIAGNOSIS — F419 Anxiety disorder, unspecified: Secondary | ICD-10-CM | POA: Diagnosis not present

## 2020-10-09 DIAGNOSIS — E1140 Type 2 diabetes mellitus with diabetic neuropathy, unspecified: Secondary | ICD-10-CM | POA: Diagnosis not present

## 2020-10-09 DIAGNOSIS — Z794 Long term (current) use of insulin: Secondary | ICD-10-CM | POA: Diagnosis not present

## 2020-10-09 DIAGNOSIS — E039 Hypothyroidism, unspecified: Secondary | ICD-10-CM | POA: Diagnosis not present

## 2020-10-09 DIAGNOSIS — G4733 Obstructive sleep apnea (adult) (pediatric): Secondary | ICD-10-CM | POA: Diagnosis not present

## 2020-10-09 DIAGNOSIS — M797 Fibromyalgia: Secondary | ICD-10-CM | POA: Diagnosis not present

## 2020-10-09 DIAGNOSIS — E669 Obesity, unspecified: Secondary | ICD-10-CM | POA: Diagnosis not present

## 2020-10-09 DIAGNOSIS — Z6841 Body Mass Index (BMI) 40.0 and over, adult: Secondary | ICD-10-CM | POA: Diagnosis not present

## 2020-10-09 DIAGNOSIS — Z9981 Dependence on supplemental oxygen: Secondary | ICD-10-CM | POA: Diagnosis not present

## 2020-10-09 DIAGNOSIS — D649 Anemia, unspecified: Secondary | ICD-10-CM | POA: Diagnosis not present

## 2020-10-11 ENCOUNTER — Other Ambulatory Visit: Payer: Self-pay

## 2020-10-12 DIAGNOSIS — H35033 Hypertensive retinopathy, bilateral: Secondary | ICD-10-CM | POA: Diagnosis not present

## 2020-10-12 DIAGNOSIS — H43813 Vitreous degeneration, bilateral: Secondary | ICD-10-CM | POA: Diagnosis not present

## 2020-10-12 DIAGNOSIS — J449 Chronic obstructive pulmonary disease, unspecified: Secondary | ICD-10-CM | POA: Diagnosis not present

## 2020-10-12 DIAGNOSIS — H353232 Exudative age-related macular degeneration, bilateral, with inactive choroidal neovascularization: Secondary | ICD-10-CM | POA: Diagnosis not present

## 2020-10-12 DIAGNOSIS — H43822 Vitreomacular adhesion, left eye: Secondary | ICD-10-CM | POA: Diagnosis not present

## 2020-10-13 ENCOUNTER — Other Ambulatory Visit: Payer: Self-pay

## 2020-10-13 ENCOUNTER — Ambulatory Visit (INDEPENDENT_AMBULATORY_CARE_PROVIDER_SITE_OTHER): Payer: Medicare HMO | Admitting: Internal Medicine

## 2020-10-13 ENCOUNTER — Encounter: Payer: Self-pay | Admitting: Internal Medicine

## 2020-10-13 VITALS — BP 128/82 | HR 75 | Ht 69.0 in | Wt 275.2 lb

## 2020-10-13 DIAGNOSIS — Z794 Long term (current) use of insulin: Secondary | ICD-10-CM | POA: Diagnosis not present

## 2020-10-13 DIAGNOSIS — C73 Malignant neoplasm of thyroid gland: Secondary | ICD-10-CM | POA: Diagnosis not present

## 2020-10-13 DIAGNOSIS — IMO0002 Reserved for concepts with insufficient information to code with codable children: Secondary | ICD-10-CM

## 2020-10-13 DIAGNOSIS — E1159 Type 2 diabetes mellitus with other circulatory complications: Secondary | ICD-10-CM | POA: Diagnosis not present

## 2020-10-13 DIAGNOSIS — E039 Hypothyroidism, unspecified: Secondary | ICD-10-CM

## 2020-10-13 DIAGNOSIS — E1165 Type 2 diabetes mellitus with hyperglycemia: Secondary | ICD-10-CM

## 2020-10-13 LAB — POCT GLYCOSYLATED HEMOGLOBIN (HGB A1C): Hemoglobin A1C: 7.6 % — AB (ref 4.0–5.6)

## 2020-10-13 NOTE — Progress Notes (Signed)
Patient ID: Mary Acosta, female   DOB: Jul 24, 1956, 65 y.o.   MRN: [id number]   This visit occurred during the SARS-CoV-2 public health emergency.  Safety protocols were in place, including screening questions prior to the visit, additional usage of staff PPE, and extensive cleaning of exam room while observing appropriate contact time as indicated for disinfecting solutions.    HPI: Mary Acosta is a 65 y.o.-year-old female, initially referred by her PCP, Dr. Quay Burow, returning for follow-up for DM2, dx in 2000, insulin-dependent since 2011, uncontrolled, with long-term complications (peripheral neuropathy, gastroparesis, cerebrovascular disease with history of TIA 2012, DKA 2015) and also history of thyroid cancer and postsurgical hypothyroidism.  She previously saw my colleague, Dr. Loanne Drilling, up to 07/2019.  Last visit with me 2 months ago.  DM2: Reviewed HbA1c levels: Lab Results  Component Value Date   HGBA1C 8.1 (A) 07/05/2020   HGBA1C 10.4 (H) 05/03/2020   HGBA1C 11.0 (H) 10/22/2019   HGBA1C 9.4 (H) 07/20/2019   HGBA1C 8.8 (H) 04/01/2019   HGBA1C 10.5 (A) 10/08/2018   HGBA1C 10.8 (H) 09/04/2018   HGBA1C 9.9 (H) 06/01/2018   HGBA1C 9.8 (A) 04/30/2018   HGBA1C 9.9 (A) 02/24/2018   She was previously on: - Toujeo 180 units daily >> Basaglar 80 units at bedtime -Lilly Cares - Metformin 1000 mg with dinner - Rybelsus 14 mg before b'fast  She did not start Ozempic yet as advised 07/2020. Prev. On Rybelsus 3 mg before breakfast-see patient assistance pgm >> at last OV I recommended 7 mg -she did not receive this yet She was on N and R insulin in the past. She was on metformin in the past >> did not work.  Pt checks her sugars 2-3 times a day: - am: 140-150 on Rybelsus 3 mg; up to 200 off Rybelsus >> 75, 200-225 >> 125-225 - 2h after b'fast: n/c - before lunch: n/c - 2h after lunch: n/c - before dinner: 130-150 on Rybelsus 3 mg; up to 250 off Rybelsus >> 200-225 >>  150-170 - 2h after dinner: n/c - bedtime: n/c >> 200s - nighttime: n/c Lowest sugar was 130 >> 75 >> 120; she has hypoglycemia awareness in the 70s. Highest sugar was 600 >> 250 >> high 200.  Glucometer: AccuChek, Prodigy  Pt's meals are: - Breakfast: bacon or sausage and eggs, cereal  - stopped - Lunch: salad; hot dog - Dinner: meat + veggies - Snacks: not so much anymore Previously drinking Kool-Aid, now stopped  She is status post gastric banding with vagotomy in 2008.  She lost 148 pounds afterwards but then gained more than 100 pounds back.  No h/o pancreatitis or FH of MTC.  No CKD, last BUN/creatinine:  Lab Results  Component Value Date   BUN 14 05/03/2020   BUN 18 10/22/2019   CREATININE 0.97 05/03/2020   CREATININE 0.98 10/22/2019  Not on ACE inhibitor/ARB.  + HL; last set of lipids: Lab Results  Component Value Date   CHOL 126 05/03/2020   HDL 58 05/03/2020   LDLCALC 55 05/03/2020   TRIG 54 05/03/2020   CHOLHDL 2.2 05/03/2020  On Zocor 20, Zetia 10.  - last eye exam was in 09/2020 reportedly:no DR. Stopped IO injections for macular degeneration.  -She has pain, numbness, tingling in her feet.  On ASA 81.  Pt has FH of DM in mother.  Thyroid cancer:  Reviewed her thyroid cancer history: She is status post total thyroidectomy - [place] Sx 2007.  11/22/2005: THYROID, THYROIDECTOMY:  - PAPILLARY THYROID CARCINOMA (0.5 CM).  - ADENOMATOID NODULES.   COMMENT  ONCOLOGY TABLE-THYROID   1. Maximum tumor size (cm): 0.5 cm  2. Tumor location: Right mid lobe  3. Multifocality: Not identified  4. Histology: Papillary thyroid carcinoma  5. Margins: Negative  6. Capsular invasion: Present  7. Extrathyroidal extension: Not identified  8. Vascular/Lymphatic invasion: Not identified  9. Lymph nodes: N/A  10. TNM code: pT1, pNX, pMX  11. Comments: Examination of the thyroidectomy specimen  grossly demonstrates multiple nodules.  Histologic evaluation  demonstrates numerous adenomatoid nodules. One nodule measuring  0.5 cm demonstrates features compatible with papillary thyroid  carcinoma. The surgical margins are negative. (MS:jy) 11/25/05   Postsurgical hypothyroidism:  She is currently on 200 mcg daily (she did not see the message to decrease the dose 07/2020...): - in am - fasting - at least 30 min from b'fast - no calcium - no iron - no multivitamins - no PPIs - not on Biotin  Reviewed her TFTs: Lab Results  Component Value Date   TSH 0.14 (L) 08/04/2020   TSH 4.42 05/03/2020   TSH 1.50 10/22/2019   TSH 1.09 07/20/2019   TSH 1.32 04/01/2019   TSH 0.55 09/04/2018   TSH 0.90 12/26/2017   TSH 0.113 (L) 12/08/2017   TSH 0.22 (L) 09/19/2017   TSH 0.62 06/13/2017   TSH 1.43 01/22/2017   TSH 6.67 (H) 12/10/2016   TSH 1.12 01/29/2016   TSH 3.320 08/28/2014   TSH 5.470 (H) 07/10/2014   Pt denies: - feeling nodules in neck - hoarseness - dysphagia - choking - SOB with lying down  Not on steroids or biotin.  She also has a history of COPD, OSA, fibromyalgia, depression, fatty liver-cirrhosis, DDD, h/o vb fx, PMR.  She is seen in pain clinic in [facility].  She is on Neurontin and amitriptyline.  She does PT at home.  ROS: Constitutional: no weight gain/+ weight loss (12 pounds in the last 2 months!), + fatigue, no subjective hyperthermia, no subjective hypothermia Eyes: no blurry vision, no xerophthalmia ENT: no sore throat, + see HPI Cardiovascular: no CP/+ SOB/no palpitations/no leg swelling Respiratory: no cough/+ SOB/no wheezing Gastrointestinal: no N/no V/no D/no C/no acid reflux Musculoskeletal:  + muscle and joint aches Skin: no rashes, no hair loss Neurological: + tremors/no numbness/no tingling/no dizziness  I reviewed pt's medications, allergies, PMH, social hx, family hx, and changes were documented in the history of present illness. Otherwise, unchanged from my initial  visit note.  Past Medical History:  Diagnosis Date  . AKI (acute kidney injury) ([place]) 01/2017  . Anxiety    with panic attacks  . Arthritis    "back; feet; hands; shoulders" (08/26/2014)  . Asthma   . Cervical cancer ([place])   . Chronic lower back pain   . Chronic narcotic use   . Chronic pain syndrome    [facility]  . Cirrhosis ([place])   . Clostridium difficile infection 2017  . COPD (chronic obstructive pulmonary disease) ([place])   . Daily headache   . Depression   . Diabetic neuropathy ([place]) 06/04/2017  . DJD (degenerative joint disease)   . Fatty liver disease, nonalcoholic   . Fibromyalgia   . Frequency of urination   . HCAP (healthcare-associated pneumonia) 08/26/2014  . History of TIA (transient ischemic attack) 11-01-2010   NO RESIDUAL  . Hyperlipidemia   . Hypertension   . Hypothyroidism   . IDDM (insulin dependent  diabetes mellitus)   . Insomnia   . Lumbar stenosis   . Memory difficulty 07/25/2016  . Nocturia   . OSA (obstructive sleep apnea)    NO CPAP SINCE WT LOSS  . Osteoarthritis    with severe disease in knee  . Pneumonia "several times"  . Polymyalgia rheumatica ([place])   . Scoliosis   . Seasonal allergies   . Thyroid cancer ([place])   . Urgency of urination   . Vaginal pain S/P SLING  FEB 2012   Past Surgical History:  Procedure Laterality Date  . APPENDECTOMY  1982  . BREAST EXCISIONAL BIOPSY Left 02/28/2005   Atypical Ductal Hyperplasia  . CARDIAC CATHETERIZATION  09-04-2004   NORMAL CORONARY ANATOMY/ NORMAL LVF/ EF 60%  . CARDIOVASCULAR STRESS TEST  12-27-2010  DR [place]   ABNORMAL NUCLEAR STUDY W/ /MILD INFERIOR ISCHEMIA/ EF 69%/  CT HEART ANGIOGRAM ;  NO ACUTE FINDINGS  . CRYOABLATION  05/16/2003   w/LEEP FOR ABNORMAL PAP SMEAR  . CYSTOSCOPY  05/18/2012   Procedure: CYSTOSCOPY;  Surgeon: Reece Packer, MD;  Location: [facility];  Service: Urology;  Laterality: N/A;  examination under anethesia  .  ESOPHAGOGASTRODUODENOSCOPY (EGD) WITH PROPOFOL N/A 09/03/2016   Procedure: ESOPHAGOGASTRODUODENOSCOPY (EGD) WITH PROPOFOL;  Surgeon: Doran Stabler, MD;  Location: [facility];  Service: Gastroenterology;  Laterality: N/A;  . HYSTEROSCOPY WITH D & C  08-19-2007   PMB  . KNEE ARTHROSCOPY W/ DEBRIDEMENT Left 03/29/2006   INTERNAL DERANGEMENT/ SEVERE DJD/ MENISCUS TEARS  . LAPAROSCOPIC CHOLECYSTECTOMY  06-10-2002  . LAPAROSCOPIC GASTRIC BANDING  03/01/2006   TRUNCAL VAGOTOMY/ PLACEMENT OF VG BAND  . REVISION TOTAL KNEE ARTHROPLASTY Left 08-29-2008; 05/2009  . TONSILLECTOMY  1969  . TOTAL KNEE ARTHROPLASTY Left 01-23-2007   SEVERE DJD  . TOTAL THYROIDECTOMY  11-22-2005   BILATERAL THYROID NODULES-- PAPILLARY CARCINOMA (0.5CM)/ ADENOMATOID NODULES  . TRANSTHORACIC ECHOCARDIOGRAM  12-27-2010   LVSF NORMAL / EF 36-14%/ GRADE I DIASTOLIC DYSFUNCTION/ MILD MITRAL REGURG. / MILDLY DILATED LEFT ATRIUM/ MILDY INCREASED SYSTOLIC PRESSURE OF PULMONARY ARTERIES  . TRANSVAGINAL SUBURETERAL TAPE/ SLING  09-28-2010   MIXED URINARY INCONTINENCE  . TUBAL LIGATION  1983   Social History   Socioeconomic History  . Marital status: Married    Spouse name: Not on file  . Number of children: 2  . Years of education: 36  . Highest education level: Not on file  Occupational History  . Occupation: disabled    Fish farm manager: UNEMPLOYED  Tobacco Use  . Smoking status: Former Smoker    Packs/day: 2.50    Years: 39.00    Pack years: 97.50    Types: Cigarettes    Quit date: 10/22/2010    Years since quitting: 9.9  . Smokeless tobacco: Never Used  Vaping Use  . Vaping Use: Never used  Substance and Sexual Activity  . Alcohol use: No    Alcohol/week: 0.0 standard drinks  . Drug use: No  . Sexual activity: Not Currently  Other Topics Concern  . Not on file  Social History Narrative   Lives at home w/ her husband    Right-handed   Caffeine: 1 cup of coffee per week + [employer]epsi   Social Determinants of Health    Financial Resource Strain: Medium Risk  . Difficulty of Paying Living Expenses: Somewhat hard  Food Insecurity: Not on file  Transportation Needs: Not on file  Physical Activity: Not on file  Stress: Not on file  Social Connections: Not  on file  Intimate Partner Violence: Not At Risk  . Fear of Current or Ex-Partner: No  . Emotionally Abused: No  . Physically Abused: No  . Sexually Abused: No   Current Outpatient Medications on File Prior to Visit  Medication Sig Dispense Refill  . acetaminophen (TYLENOL) 500 MG tablet Take 1,000 mg by mouth 2 (two) times daily.    Marland Kitchen albuterol (VENTOLIN HFA) 108 (90 Base) MCG/ACT inhaler TAKE 2 PUFFS BY MOUTH EVERY 6 HOURS AS NEEDED FOR WHEEZE OR SHORTNESS OF BREATH 18 g 11  . amitriptyline (ELAVIL) 25 MG tablet Take 75 mg by mouth at bedtime.    Marland Kitchen amLODipine (NORVASC) 5 MG tablet TAKE ONE TABLET BY MOUTH EVERY MORNING and TAKE ONE TABLET BY MOUTH EVERYDAY AT BEDTIME 180 tablet 2  . aspirin EC 81 MG tablet Take 1 tablet (81 mg total) by mouth daily. 90 tablet 3  . furosemide (LASIX) 40 MG tablet TAKE ONE TABLET BY MOUTH dAILY 30 tablet 5  . gabapentin (NEURONTIN) 300 MG capsule Take 300 mg by mouth 3 (three) times daily.     . hydrALAZINE (APRESOLINE) 100 MG tablet TAKE ONE TABLET BY MOUTH EVERY MORNING and TAKE ONE TABLET BY MOUTH EVERYDAY AT BEDTIME 180 tablet 2  . hydrOXYzine (ATARAX/VISTARIL) 25 MG tablet Take 1 tablet (25 mg total) by mouth in the morning and at bedtime. 180 tablet 1  . insulin glargine (LANTUS) 100 UNIT/ML injection Inject 1.8 mLs (180 Units total) into the skin daily. (Patient taking differently: Inject 80 Units into the skin daily.) 60 mL 11  . Insulin Pen Needle (PEN NEEDLES) 31G X 8 MM MISC 1 Device by Does not apply route daily. 90 each 3  . Insulin Syringe-Needle U-100 26G X 1/2" 1 ML MISC Use daily for insulin injection as directed 100 each 3  . ipratropium-albuterol (DUONEB) 0.5-2.5 (3) MG/3ML SOLN Take 3 mLs by  nebulization every 6 (six) hours as needed (Wheezing or dyspnea.). 360 mL 11  . levothyroxine (SYNTHROID) 200 MCG tablet Take 1 tablet (200 mcg total) by mouth daily before breakfast. 90 tablet 1  . meloxicam (MOBIC) 7.5 MG tablet Take 7.5 mg by mouth daily.    . metFORMIN (GLUCOPHAGE) 500 MG tablet Take 1 tablet (500 mg total) by mouth 2 (two) times daily with a meal. 180 tablet 3  . metoprolol tartrate (LOPRESSOR) 25 MG tablet Take 1 tablet (25 mg total) by mouth 2 (two) times daily. 180 tablet 3  . morphine (MS CONTIN) 15 MG 12 hr tablet Take 15 mg by mouth daily.    Marland Kitchen morphine (MS CONTIN) 30 MG 12 hr tablet Take 30 mg by mouth 2 (two) times daily.    . naloxone (NARCAN) nasal spray 4 mg/0.1 mL One spray in either nostril once for known/suspected opioid overdose. May repeat every 2-3 minutes in alternating nostril til EMS arrives    . ondansetron (ZOFRAN-ODT) 4 MG disintegrating tablet Take 1 tablet (4 mg total) by mouth every 6 (six) hours as needed for nausea or vomiting. 30 tablet 3  . PARoxetine (PAXIL) 40 MG tablet Take 1 tablet (40 mg total) by mouth daily. 90 tablet 1  . potassium chloride SA (KLOR-CON M20) 20 MEQ tablet TAKE 2 TABLETS EVERY DAY AS NEEDED FOR CRAMPING 180 tablet 1  . Semaglutide (RYBELSUS) 7 MG TABS Take 7 mg by mouth daily. 30 min before breakfast with 4 oz water    . simvastatin (ZOCOR) 20 MG tablet TAKE ONE TABLET BY  MOUTH EVERYDAY AT BEDTIME 30 tablet 6  . tiZANidine (ZANAFLEX) 2 MG tablet Take 2 mg by mouth as needed for muscle spasms.   2  . traZODone (DESYREL) 100 MG tablet TAKE ONE TABLET BY MOUTH EVERYDAY AT BEDTIME     No current facility-administered medications on file prior to visit.   Allergies  Allergen Reactions  . Gabapentin Swelling    Swelling in legs Swelling in legs Swelling in legs  . Losartan Other (See Comments)    Myalgias and muscle cramping Other reaction(s): Other (See Comments) Myalgias and muscle cramping Myalgias and muscle  cramping Other reaction(s): Other (See Comments) Myalgias and muscle cramping  . Aricept [Donepezil Hcl]     Nausea/vomiting, low BP  . Oxycodone Itching  . Sulfa Antibiotics Nausea Only and Rash  . Sulfonamide Derivatives Nausea Only   Family History  Problem Relation Age of Onset  . Diabetes Mother   . Heart disease Mother   . Dementia Mother   . Heart disease Father   . High blood pressure Father   . Colon cancer Maternal Uncle        x 2  . Breast cancer Other        great aunts x 5    PE: BP 128/82   Pulse 75   Ht 5\' 9"  (1.753 m)   Wt 275 lb 3.2 oz (124.8 kg)   SpO2 95%   BMI 40.64 kg/m  Wt Readings from Last 3 Encounters:  10/13/20 275 lb 3.2 oz (124.8 kg)  10/05/20 267 lb (121.1 kg)  08/29/20 279 lb 8 oz (126.8 kg)   Constitutional: overweight, in NAD Eyes: PERRLA, EOMI, no exophthalmos ENT: moist mucous membranes, no thyromegaly, no cervical lymphadenopathy Cardiovascular: RRR, No MRG Respiratory: CTA B Gastrointestinal: abdomen soft, NT, ND, BS+ Musculoskeletal: no deformities, strength intact in all 4 Skin: moist, warm, no rashes Neurological: no tremor with outstretched hands, DTR normal in all 4  ASSESSMENT: 1. DM2, insulin-dependent, uncontrolled, with complications  2.  History of thyroid cancer  3.  Postsurgical hypothyroidism  PLAN:  1. Patient with longstanding, uncontrolled, type 2 diabetes, on long-acting insulin, Metformin, and now weekly GLP-1 with agonist, changed from po at last visit. At that time, sugars were much better after she started Rybelsus, but she ran out of medication. I advised her to switch to Ozempic especially since her sugars were mostly above target, in the 200s.  -At last visit, HbA1c was 8.1%, improved from 10.4%. -At this visit, she tells me that she did not start [place] yet, since he just received Rybelsus through patient assistance.  At this visit, however, sugars are improved but day remain higher than target, so I  again advised her to change to Ozempic.  I gave her patient assistance application for Novo.  Until then, we will continue with Rybelsus 14 mg daily. - I suggested to:  Patient Instructions  Please continue: - Metformin 1000 mg with dinner - Basaglar 80 units at bedtime  Please try to change from Rybelsus to: - Ozempic 0.5 mg weekly in a.m.   Please come back for labs in 1.5 months.  Please decrease Levothyroxine 200 mcg 6/7 days and 100 mcg 1/7 days.  Take the thyroid hormone every day, with water, at least 30 minutes before breakfast, separated by at least 4 hours from: - acid reflux medications - calcium - iron - multivitamins  Please return in 3 months with your sugar log.   - we checked her HbA1c:  7.6% (better) - advised to check sugars at different times of the day - 1-2x a day, rotating check times - advised for yearly eye exams >> she is UTD - return to clinic in 3 months  2.  History of thyroid cancer -Per review of the chart, she had a papillary thyroid cancer focus in the right thyroid lobe, measuring 0.5 cm.  There was no vascular, lymphatic, with direct extension into the thyroid tissue. -She denies neck compression symptoms -She is most likely cured of her cancer but would like to obtain a neck ultrasound >> we will check this today  3.  Postsurgical hypothyroidism - latest thyroid labs reviewed with pt >> TSH was suppressed: Lab Results  Component Value Date   TSH 0.14 (L) 08/04/2020   - she continues on LT4 200 mcg daily (did not decrease the dose to 200 mcg 6/7 days and 100 mcg 1/7 days as advised - will ) - pt feels good on this dose. - we discussed about taking the thyroid hormone every day, with water, >30 minutes before breakfast, separated by >4 hours from acid reflux medications, calcium, iron, multivitamins. Pt. is taking it correctly. - will check thyroid tests in 1.5 months after  Orders Placed This Encounter  Procedures  . US THYROID  . TSH  .  T4, free  . POCT glycosylated hemoglobin (Hb A1C)   Philemon Kingdom, MD PhD [facility]

## 2020-10-13 NOTE — Patient Instructions (Addendum)
Please continue: - Metformin 1000 mg with dinner - Basaglar 80 units at bedtime  Please try to change from Rybelsus to: - Ozempic 0.5 mg weekly in a.m.   Please come back for labs in 1.5 months.  Please decrease Levothyroxine 200 mcg 6/7 days and 100 mcg 1/7 days.  Take the thyroid hormone every day, with water, at least 30 minutes before breakfast, separated by at least 4 hours from: - acid reflux medications - calcium - iron - multivitamins  Please return in 3 months with your sugar log.

## 2020-10-23 ENCOUNTER — Telehealth: Payer: Self-pay | Admitting: Pharmacist

## 2020-10-23 NOTE — Progress Notes (Signed)
Chronic Care Management Pharmacy Assistant   Name: Mary Acosta  MRN: [id number] DOB: [birth year]  Reason for Encounter: Chart Review   PCP : Binnie Rail, MD  Allergies:   Allergies  Allergen Reactions  . Gabapentin Swelling    Swelling in legs Swelling in legs Swelling in legs  . Losartan Other (See Comments)    Myalgias and muscle cramping Other reaction(s): Other (See Comments) Myalgias and muscle cramping Myalgias and muscle cramping Other reaction(s): Other (See Comments) Myalgias and muscle cramping  . Aricept [Donepezil Hcl]     Nausea/vomiting, low BP  . Oxycodone Itching  . Sulfa Antibiotics Nausea Only and Rash  . Sulfonamide Derivatives Nausea Only    Medications: Outpatient Encounter Medications as of 10/23/2020  Medication Sig Note  . acetaminophen (TYLENOL) 500 MG tablet Take 1,000 mg by mouth 2 (two) times daily.   Marland Kitchen albuterol (VENTOLIN HFA) 108 (90 Base) MCG/ACT inhaler TAKE 2 PUFFS BY MOUTH EVERY 6 HOURS AS NEEDED FOR WHEEZE OR SHORTNESS OF BREATH   . amitriptyline (ELAVIL) 25 MG tablet Take 75 mg by mouth at bedtime.   Marland Kitchen amLODipine (NORVASC) 5 MG tablet TAKE ONE TABLET BY MOUTH EVERY MORNING and TAKE ONE TABLET BY MOUTH EVERYDAY AT BEDTIME   . aspirin EC 81 MG tablet Take 1 tablet (81 mg total) by mouth daily.   . furosemide (LASIX) 40 MG tablet TAKE ONE TABLET BY MOUTH dAILY   . gabapentin (NEURONTIN) 300 MG capsule Take 300 mg by mouth 3 (three) times daily.  08/29/2020: As needed  . hydrALAZINE (APRESOLINE) 100 MG tablet TAKE ONE TABLET BY MOUTH EVERY MORNING and TAKE ONE TABLET BY MOUTH EVERYDAY AT BEDTIME   . hydrOXYzine (ATARAX/VISTARIL) 25 MG tablet Take 1 tablet (25 mg total) by mouth in the morning and at bedtime.   . Insulin Glargine (BASAGLAR KWIKPEN) 100 UNIT/ML Inject 80 Units into the skin daily.   . Insulin Pen Needle (PEN NEEDLES) 31G X 8 MM MISC 1 Device by Does not apply route daily.   . Insulin Syringe-Needle U-100 26G X  1/2" 1 ML MISC Use daily for insulin injection as directed   . ipratropium-albuterol (DUONEB) 0.5-2.5 (3) MG/3ML SOLN Take 3 mLs by nebulization every 6 (six) hours as needed (Wheezing or dyspnea.).   Marland Kitchen levothyroxine (SYNTHROID) 200 MCG tablet Take 1 tablet (200 mcg total) by mouth daily before breakfast.   . meloxicam (MOBIC) 7.5 MG tablet Take 7.5 mg by mouth daily.   . metFORMIN (GLUCOPHAGE) 500 MG tablet Take 1 tablet (500 mg total) by mouth 2 (two) times daily with a meal.   . metoprolol tartrate (LOPRESSOR) 25 MG tablet Take 1 tablet (25 mg total) by mouth 2 (two) times daily.   Marland Kitchen morphine (MS CONTIN) 15 MG 12 hr tablet Take 15 mg by mouth daily.   Marland Kitchen morphine (MS CONTIN) 30 MG 12 hr tablet Take 30 mg by mouth 2 (two) times daily.   . naloxone (NARCAN) nasal spray 4 mg/0.1 mL One spray in either nostril once for known/suspected opioid overdose. May repeat every 2-3 minutes in alternating nostril til EMS arrives   . ondansetron (ZOFRAN-ODT) 4 MG disintegrating tablet Take 1 tablet (4 mg total) by mouth every 6 (six) hours as needed for nausea or vomiting.   Marland Kitchen PARoxetine (PAXIL) 40 MG tablet Take 1 tablet (40 mg total) by mouth daily.   . potassium chloride SA (KLOR-CON M20) 20 MEQ tablet TAKE 2 TABLETS EVERY DAY  AS NEEDED FOR CRAMPING 08/29/2020: 08/29/20 very rarely  . Semaglutide (RYBELSUS) 7 MG TABS Take 7 mg by mouth daily. 30 min before breakfast with 4 oz water   . simvastatin (ZOCOR) 20 MG tablet TAKE ONE TABLET BY MOUTH EVERYDAY AT BEDTIME   . tiZANidine (ZANAFLEX) 2 MG tablet Take 2 mg by mouth as needed for muscle spasms.    . traZODone (DESYREL) 100 MG tablet TAKE ONE TABLET BY MOUTH EVERYDAY AT BEDTIME    No facility-administered encounter medications on file as of 10/23/2020.    Current Diagnosis: Patient Active Problem List   Diagnosis Date Noted  . URI (upper respiratory infection) 09/01/2020  . Uncontrolled type 2 diabetes mellitus with circulatory disorder, with long-term  current use of insulin ([place]) 07/05/2020  . PSVT (paroxysmal supraventricular tachycardia) ([place]) 05/31/2020  . UTI (urinary tract infection) 03/01/2020  . Grief 02/03/2019  . Nausea 01/04/2019  . Reactive airway disease 10/21/2018  . Arthralgia 09/04/2018  . Narcotic overdose ([place]) 05/14/2018  . Thyroid cancer ([place]) 04/30/2018  . Obesity hypoventilation syndrome ([place]) 09/24/2017  . Palpitations 09/18/2017  . Diabetic neuropathy ([place]) 06/04/2017  . Vitamin B12 deficiency 03/05/2017  . Numbness and tingling in both hands 02/24/2017  . OSA (obstructive sleep apnea) 01/31/2017  . Liver cirrhosis secondary to NASH ([place]) 01/31/2017  . Hypothyroidism 01/31/2017  . Chronic narcotic use 01/31/2017  . Fibromyalgia 01/31/2017  . Hyperlipidemia 01/31/2017  . Gastroparesis   . Memory disorder 07/25/2016  . Lump in neck 01/29/2016  . Chronic diastolic (congestive) heart failure ([place]) 01/03/2016  . Fatty liver 01/03/2016  . Type 1 diabetes mellitus ([place]) 01/03/2016  . Recurrent Clostridium difficile diarrhea 01/03/2016  . Chronic respiratory failure ([place]) 11/10/2014  . Physical deconditioning 09/26/2014  . Dyspnea 09/26/2014  . Iron deficiency anemia, unspecified  04/05/2011  . Bariatric surgery status 04/05/2011  . Left arm pain   . UNSPECIFIED VITAMIN D DEFICIENCY 10/22/2007  . LOW BACK PAIN, CHRONIC 10/22/2007  . INSOMNIA 10/22/2007  . Obesity 10/14/2007  . Chronic pain syndrome 10/14/2007  . CARPAL TUNNEL SYNDROME 10/14/2007  . ALLERGIC RHINITIS CAUSE UNSPECIFIED 10/14/2007  . ARTHRITIS 10/14/2007  . Anxiety state 08/25/2007  . Depression 08/25/2007  . Essential hypertension 08/25/2007  . ASTHMA 08/25/2007  . CONSTIPATION 08/25/2007  . POLYMYALGIA RHEUMATICA 08/25/2007  . LEG EDEMA, BILATERAL 08/25/2007    Goals Addressed   None     Follow-Up:  Pharmacist Review   Reviewed chart for medication changes and adherence.  No ov, consults or hospital visits since last care  coordination call with clinical pharmacist No medication changes indicated.  No gaps in adherence identified. Patient has follow up scheduled with pharmacy team. No further action required.   Wendy Poet, [place] [phone]

## 2020-10-25 ENCOUNTER — Ambulatory Visit
Admission: RE | Admit: 2020-10-25 | Discharge: 2020-10-25 | Disposition: A | Discharge status: Home / Self Care | Payer: Medicare HMO | Source: Ambulatory Visit | Attending: Internal Medicine | Admitting: Internal Medicine

## 2020-10-25 DIAGNOSIS — Z85850 Personal history of malignant neoplasm of thyroid: Secondary | ICD-10-CM | POA: Diagnosis not present

## 2020-10-25 DIAGNOSIS — E890 Postprocedural hypothyroidism: Secondary | ICD-10-CM | POA: Diagnosis not present

## 2020-10-25 DIAGNOSIS — E039 Hypothyroidism, unspecified: Secondary | ICD-10-CM

## 2020-10-26 ENCOUNTER — Telehealth: Payer: Self-pay | Admitting: Pharmacist

## 2020-10-26 NOTE — Progress Notes (Incomplete)
Chronic Care Management Pharmacy Assistant   Name: Mary Acosta  MRN: [id number] DOB: 01-Apr-1956  Reason for Encounter: Medication Review  PCP : Binnie Rail, MD  Allergies:   Allergies  Allergen Reactions  . Gabapentin Swelling    Swelling in legs Swelling in legs Swelling in legs  . Losartan Other (See Comments)    Myalgias and muscle cramping Other reaction(s): Other (See Comments) Myalgias and muscle cramping Myalgias and muscle cramping Other reaction(s): Other (See Comments) Myalgias and muscle cramping  . Aricept [Donepezil Hcl]     Nausea/vomiting, low BP  . Oxycodone Itching  . Sulfa Antibiotics Nausea Only and Rash  . Sulfonamide Derivatives Nausea Only    Medications: Outpatient Encounter Medications as of 10/26/2020  Medication Sig Note  . acetaminophen (TYLENOL) 500 MG tablet Take 1,000 mg by mouth 2 (two) times daily.   Marland Kitchen albuterol (VENTOLIN HFA) 108 (90 Base) MCG/ACT inhaler TAKE 2 PUFFS BY MOUTH EVERY 6 HOURS AS NEEDED FOR WHEEZE OR SHORTNESS OF BREATH   . amitriptyline (ELAVIL) 25 MG tablet Take 75 mg by mouth at bedtime.   Marland Kitchen amLODipine (NORVASC) 5 MG tablet TAKE ONE TABLET BY MOUTH EVERY MORNING and TAKE ONE TABLET BY MOUTH EVERYDAY AT BEDTIME   . aspirin EC 81 MG tablet Take 1 tablet (81 mg total) by mouth daily.   . furosemide (LASIX) 40 MG tablet TAKE ONE TABLET BY MOUTH dAILY   . gabapentin (NEURONTIN) 300 MG capsule Take 300 mg by mouth 3 (three) times daily.  08/29/2020: As needed  . hydrALAZINE (APRESOLINE) 100 MG tablet TAKE ONE TABLET BY MOUTH EVERY MORNING and TAKE ONE TABLET BY MOUTH EVERYDAY AT BEDTIME   . hydrOXYzine (ATARAX/VISTARIL) 25 MG tablet Take 1 tablet (25 mg total) by mouth in the morning and at bedtime.   . Insulin Glargine (BASAGLAR KWIKPEN) 100 UNIT/ML Inject 80 Units into the skin daily.   . Insulin Pen Needle (PEN NEEDLES) 31G X 8 MM MISC 1 Device by Does not apply route daily.   . Insulin Syringe-Needle U-100 26G X  1/2" 1 ML MISC Use daily for insulin injection as directed   . ipratropium-albuterol (DUONEB) 0.5-2.5 (3) MG/3ML SOLN Take 3 mLs by nebulization every 6 (six) hours as needed (Wheezing or dyspnea.).   Marland Kitchen levothyroxine (SYNTHROID) 200 MCG tablet Take 1 tablet (200 mcg total) by mouth daily before breakfast.   . meloxicam (MOBIC) 7.5 MG tablet Take 7.5 mg by mouth daily.   . metFORMIN (GLUCOPHAGE) 500 MG tablet Take 1 tablet (500 mg total) by mouth 2 (two) times daily with a meal.   . metoprolol tartrate (LOPRESSOR) 25 MG tablet Take 1 tablet (25 mg total) by mouth 2 (two) times daily.   Marland Kitchen morphine (MS CONTIN) 15 MG 12 hr tablet Take 15 mg by mouth daily.   Marland Kitchen morphine (MS CONTIN) 30 MG 12 hr tablet Take 30 mg by mouth 2 (two) times daily.   . naloxone (NARCAN) nasal spray 4 mg/0.1 mL One spray in either nostril once for known/suspected opioid overdose. May repeat every 2-3 minutes in alternating nostril til EMS arrives   . ondansetron (ZOFRAN-ODT) 4 MG disintegrating tablet Take 1 tablet (4 mg total) by mouth every 6 (six) hours as needed for nausea or vomiting.   Marland Kitchen PARoxetine (PAXIL) 40 MG tablet Take 1 tablet (40 mg total) by mouth daily.   . potassium chloride SA (KLOR-CON M20) 20 MEQ tablet TAKE 2 TABLETS EVERY DAY AS  NEEDED FOR CRAMPING 08/29/2020: 08/29/20 very rarely  . Semaglutide (RYBELSUS) 7 MG TABS Take 7 mg by mouth daily. 30 min before breakfast with 4 oz water   . simvastatin (ZOCOR) 20 MG tablet TAKE ONE TABLET BY MOUTH EVERYDAY AT BEDTIME   . tiZANidine (ZANAFLEX) 2 MG tablet Take 2 mg by mouth as needed for muscle spasms.    . traZODone (DESYREL) 100 MG tablet TAKE ONE TABLET BY MOUTH EVERYDAY AT BEDTIME    No facility-administered encounter medications on file as of 10/26/2020.    Current Diagnosis: Patient Active Problem List   Diagnosis Date Noted  . URI (upper respiratory infection) 09/01/2020  . Uncontrolled type 2 diabetes mellitus with circulatory disorder, with long-term  current use of insulin ([place]) 07/05/2020  . PSVT (paroxysmal supraventricular tachycardia) ([place]) 05/31/2020  . UTI (urinary tract infection) 03/01/2020  . Grief 02/03/2019  . Nausea 01/04/2019  . Reactive airway disease 10/21/2018  . Arthralgia 09/04/2018  . Narcotic overdose ([place]) 05/14/2018  . Thyroid cancer ([place]) 04/30/2018  . Obesity hypoventilation syndrome ([place]) 09/24/2017  . Palpitations 09/18/2017  . Diabetic neuropathy ([place]) 06/04/2017  . Vitamin B12 deficiency 03/05/2017  . Numbness and tingling in both hands 02/24/2017  . OSA (obstructive sleep apnea) 01/31/2017  . Liver cirrhosis secondary to NASH ([place]) 01/31/2017  . Hypothyroidism 01/31/2017  . Chronic narcotic use 01/31/2017  . Fibromyalgia 01/31/2017  . Hyperlipidemia 01/31/2017  . Gastroparesis   . Memory disorder 07/25/2016  . Lump in neck 01/29/2016  . Chronic diastolic (congestive) heart failure ([place]) 01/03/2016  . Fatty liver 01/03/2016  . Type 1 diabetes mellitus ([place]) 01/03/2016  . Recurrent Clostridium difficile diarrhea 01/03/2016  . Chronic respiratory failure ([place]) 11/10/2014  . Physical deconditioning 09/26/2014  . Dyspnea 09/26/2014  . Iron deficiency anemia, unspecified  04/05/2011  . Bariatric surgery status 04/05/2011  . Left arm pain   . UNSPECIFIED VITAMIN D DEFICIENCY 10/22/2007  . LOW BACK PAIN, CHRONIC 10/22/2007  . INSOMNIA 10/22/2007  . Obesity 10/14/2007  . Chronic pain syndrome 10/14/2007  . CARPAL TUNNEL SYNDROME 10/14/2007  . ALLERGIC RHINITIS CAUSE UNSPECIFIED 10/14/2007  . ARTHRITIS 10/14/2007  . Anxiety state 08/25/2007  . Depression 08/25/2007  . Essential hypertension 08/25/2007  . ASTHMA 08/25/2007  . CONSTIPATION 08/25/2007  . POLYMYALGIA RHEUMATICA 08/25/2007  . LEG EDEMA, BILATERAL 08/25/2007    Reviewed chart for medication changes ahead of medication coordination call.  No OVs, Consults, or hospital visits since last care coordination call/Pharmacist visit. (If  appropriate, list visit date, provider name)  No medication changes indicated OR if recent visit, treatment plan here.  BP Readings from Last 3 Encounters:  10/13/20 128/82  08/29/20 101/65  08/04/20 120/70    Lab Results  Component Value Date   HGBA1C 7.6 (A) 10/13/2020     Patient obtains medications through Adherence Packaging  30 Days   Last adherence delivery included: Simvastatin 20mg  -Take one  tablet by mouth everyday at bedtime Furosemide 40mg  -Take one tablet by mouth every morning Metformin 500mg  -Tab Take one tab every morning and take one tablet by mouth everyday at bedtime Hydralazine 100mg -take one tablet by mouth at breakfast and at bedtime Amlodipine 5mg  -Take one tablet by mouth at breakfast and at bedtime Amitriptyline 25mg  -Take three tablets by mouth every day at bedtime Metoprolol Tar 25mg  -Take one tab by mouth every morning and take one tab by mouth every day at bedtime Levothyroxine 200 mcg- Take one tab before breakfast Tizanidine 2mg - Take one tab  every eight hours as needed Meloxicam 7.5mg - Tab Take one tablet by mouth twice daily as needed for pain Paroxetine 40mg  -Tablet  Take one tablet every day at bedtime Trazodone 100mg  Tab - Take one tab every day at bedtime Hydroxyzine Hcl 25mg  - Take one tablet by mouth three times daily as needed Gabapentin 300mg  Take one capsule by mouth three times daily as needed Movantik 25mg  - Take one tab by mouth daily    Patient is due for next adherence delivery on: 11-02-20. Called patient and reviewed medications and coordinated delivery.  This delivery to include: Simvastatin 20mg  -Take one  tablet by mouth everyday at bedtime Furosemide 40mg  -Take one tablet by mouth every morning Metformin 500mg  -Tab Take one tab every morning and take one tablet by mouth everyday at bedtime Hydralazine 100mg -take one tablet by mouth at breakfast and at bedtime Amlodipine 5mg  -Take one tablet by mouth at breakfast and at  bedtime Amitriptyline 25mg  -Take three tablets by mouth every day at bedtime Metoprolol Tar 25mg  -Take one tab by mouth every morning and take one tab by mouth every day at bedtime Levothyroxine 200 mcg- Take one tab before breakfast Tizanidine 2mg - Take one tab every eight hours as needed Meloxicam 7.5mg - Tab Take one tablet by mouth twice daily as needed for pain Paroxetine 40mg  -Tablet  Take one tablet every day at bedtime Trazodone 100mg  Tab - Take one tab every day at bedtime Hydroxyzine Hcl 25mg  - Take one tablet by mouth three times daily as needed Gabapentin 300mg  Take one capsule by mouth three times daily as needed Movantik 25mg  - Take one tab by mouth daily   Coordinated acute fill for (med) to be delivered (date).  Patient declined the following medications (meds) due to (reason)  Patient needs refills for : Paroxetine 40mg  -Tablet  Take one tablet every day at bedtime Trazodone 100mg  Tab - Take one tab every day at bedtime Hydroxyzine Hcl 25mg  - Take one tablet by mouth three times daily as needed   Confirmed delivery date of 11-02-20, advised patient that pharmacy will contact them the morning of delivery.  Georgiana Shore ,[facility] Pharmacist Assistant [phone]  Follow-Up:  Pharmacist Review

## 2020-10-27 DIAGNOSIS — G894 Chronic pain syndrome: Secondary | ICD-10-CM | POA: Diagnosis not present

## 2020-10-27 DIAGNOSIS — M533 Sacrococcygeal disorders, not elsewhere classified: Secondary | ICD-10-CM | POA: Diagnosis not present

## 2020-10-27 DIAGNOSIS — M5137 Other intervertebral disc degeneration, lumbosacral region: Secondary | ICD-10-CM | POA: Diagnosis not present

## 2020-10-31 NOTE — Patient Instructions (Addendum)
    Blood work was ordered.      Medications changes include :   none     Please followup in 6 months  

## 2020-10-31 NOTE — Progress Notes (Signed)
Subjective:    Patient ID: Mary Acosta, female    DOB: 1956-03-24, 65 y.o.   MRN: [id number]  HPI The patient is here for follow up of their chronic medical problems, including htn, DM, hypothyroidism,anxiety, depression, grief, insomnia, HFpEF, leg edema  Her son died recently - it was unexpected.    She fell recently.  She is sore in her back.   It is getting better each day.  The pain medications and muscle relaxer helps.       Medications and allergies reviewed with patient and updated if appropriate.  Patient Active Problem List   Diagnosis Date Noted  . Aortic atherosclerosis ([place]) 11/01/2020  . URI (upper respiratory infection) 09/01/2020  . Uncontrolled type 2 diabetes mellitus with circulatory disorder, with long-term current use of insulin ([place]) 07/05/2020  . PSVT (paroxysmal supraventricular tachycardia) ([place]) 05/31/2020  . UTI (urinary tract infection) 03/01/2020  . Grief 02/03/2019  . Nausea 01/04/2019  . Reactive airway disease 10/21/2018  . Arthralgia 09/04/2018  . Narcotic overdose ([place]) 05/14/2018  . Thyroid cancer ([place]) 04/30/2018  . Obesity hypoventilation syndrome ([place]) 09/24/2017  . Palpitations 09/18/2017  . Diabetic neuropathy ([place]) 06/04/2017  . Vitamin B12 deficiency 03/05/2017  . Numbness and tingling in both hands 02/24/2017  . OSA (obstructive sleep apnea) 01/31/2017  . Liver cirrhosis secondary to NASH ([place]) 01/31/2017  . Hypothyroidism 01/31/2017  . Chronic narcotic use 01/31/2017  . Fibromyalgia 01/31/2017  . Hyperlipidemia 01/31/2017  . Gastroparesis   . Memory disorder 07/25/2016  . Lump in neck 01/29/2016  . Chronic diastolic (congestive) heart failure ([place]) 01/03/2016  . Fatty liver 01/03/2016  . Type 1 diabetes mellitus ([place]) 01/03/2016  . Recurrent Clostridium difficile diarrhea 01/03/2016  . Chronic respiratory failure ([place]) 11/10/2014  . Physical deconditioning 09/26/2014  . Dyspnea 09/26/2014  . Iron deficiency  anemia, unspecified  04/05/2011  . Bariatric surgery status 04/05/2011  . Left arm pain   . UNSPECIFIED VITAMIN D DEFICIENCY 10/22/2007  . LOW BACK PAIN, CHRONIC 10/22/2007  . INSOMNIA 10/22/2007  . Obesity 10/14/2007  . Chronic pain syndrome 10/14/2007  . CARPAL TUNNEL SYNDROME 10/14/2007  . ALLERGIC RHINITIS CAUSE UNSPECIFIED 10/14/2007  . ARTHRITIS 10/14/2007  . Anxiety state 08/25/2007  . Depression 08/25/2007  . Essential hypertension 08/25/2007  . ASTHMA 08/25/2007  . CONSTIPATION 08/25/2007  . POLYMYALGIA RHEUMATICA 08/25/2007  . LEG EDEMA, BILATERAL 08/25/2007    Current Outpatient Medications on File Prior to Visit  Medication Sig Dispense Refill  . acetaminophen (TYLENOL) 500 MG tablet Take 1,000 mg by mouth 2 (two) times daily.    Marland Kitchen albuterol (VENTOLIN HFA) 108 (90 Base) MCG/ACT inhaler TAKE 2 PUFFS BY MOUTH EVERY 6 HOURS AS NEEDED FOR WHEEZE OR SHORTNESS OF BREATH 18 g 11  . amitriptyline (ELAVIL) 25 MG tablet Take 75 mg by mouth at bedtime.    Marland Kitchen amLODipine (NORVASC) 5 MG tablet TAKE ONE TABLET BY MOUTH EVERY MORNING and TAKE ONE TABLET BY MOUTH EVERYDAY AT BEDTIME 180 tablet 2  . aspirin EC 81 MG tablet Take 1 tablet (81 mg total) by mouth daily. 90 tablet 3  . diclofenac (VOLTAREN) 50 MG EC tablet Take 50 mg by mouth 2 (two) times daily.    Marland Kitchen donepezil (ARICEPT) 10 MG tablet Take by mouth.    . ezetimibe (ZETIA) 10 MG tablet Take by mouth.    . furosemide (LASIX) 40 MG tablet TAKE ONE TABLET BY MOUTH dAILY 30 tablet 5  . gabapentin (NEURONTIN)  300 MG capsule Take 300 mg by mouth 3 (three) times daily.     . hydrALAZINE (APRESOLINE) 100 MG tablet TAKE ONE TABLET BY MOUTH EVERY MORNING and TAKE ONE TABLET BY MOUTH EVERYDAY AT BEDTIME 180 tablet 2  . hydrOXYzine (ATARAX/VISTARIL) 25 MG tablet Take 1 tablet (25 mg total) by mouth in the morning and at bedtime. 180 tablet 1  . Insulin Glargine (BASAGLAR KWIKPEN) 100 UNIT/ML Inject 80 Units into the skin daily.    . Insulin  Pen Needle (PEN NEEDLES) 31G X 8 MM MISC 1 Device by Does not apply route daily. 90 each 3  . Insulin Syringe-Needle U-100 26G X 1/2" 1 ML MISC Use daily for insulin injection as directed 100 each 3  . ipratropium-albuterol (DUONEB) 0.5-2.5 (3) MG/3ML SOLN Take 3 mLs by nebulization every 6 (six) hours as needed (Wheezing or dyspnea.). 360 mL 11  . levothyroxine (SYNTHROID) 200 MCG tablet Take 1 tablet (200 mcg total) by mouth daily before breakfast. 90 tablet 1  . meloxicam (MOBIC) 7.5 MG tablet Take 7.5 mg by mouth daily.    . metFORMIN (GLUCOPHAGE) 500 MG tablet Take 1 tablet (500 mg total) by mouth 2 (two) times daily with a meal. 180 tablet 3  . metoprolol tartrate (LOPRESSOR) 25 MG tablet Take 1 tablet (25 mg total) by mouth 2 (two) times daily. 180 tablet 3  . morphine (MS CONTIN) 15 MG 12 hr tablet Take 15 mg by mouth daily.    Marland Kitchen morphine (MS CONTIN) 30 MG 12 hr tablet Take 30 mg by mouth 2 (two) times daily.    . naloxone (NARCAN) nasal spray 4 mg/0.1 mL One spray in either nostril once for known/suspected opioid overdose. May repeat every 2-3 minutes in alternating nostril til EMS arrives    . ondansetron (ZOFRAN-ODT) 4 MG disintegrating tablet Take 1 tablet (4 mg total) by mouth every 6 (six) hours as needed for nausea or vomiting. 30 tablet 3  . PARoxetine (PAXIL) 40 MG tablet Take 1 tablet (40 mg total) by mouth daily. 90 tablet 1  . potassium chloride SA (KLOR-CON M20) 20 MEQ tablet TAKE 2 TABLETS EVERY DAY AS NEEDED FOR CRAMPING 180 tablet 1  . Pseudoephedrine-Guaifenesin [phone] MG TB12 Take by mouth.    . Semaglutide (RYBELSUS) 14 MG TABS Take by mouth.    . Semaglutide (RYBELSUS) 7 MG TABS Take 7 mg by mouth daily. 30 min before breakfast with 4 oz water    . Semaglutide,0.25 or 0.5MG /DOS, (OZEMPIC, 0.25 OR 0.5 MG/DOSE,) 2 MG/1.5ML SOPN Inject into the skin.    Marland Kitchen simvastatin (ZOCOR) 20 MG tablet TAKE ONE TABLET BY MOUTH EVERYDAY AT BEDTIME 30 tablet 6  . tiZANidine (ZANAFLEX) 2  MG tablet Take 2 mg by mouth as needed for muscle spasms.   2  . traZODone (DESYREL) 100 MG tablet TAKE ONE TABLET BY MOUTH EVERYDAY AT BEDTIME     No current facility-administered medications on file prior to visit.    Past Medical History:  Diagnosis Date  . AKI (acute kidney injury) ([place]) 01/2017  . Anxiety    with panic attacks  . Arthritis    "back; feet; hands; shoulders" (08/26/2014)  . Asthma   . Cervical cancer ([place])   . Chronic lower back pain   . Chronic narcotic use   . Chronic pain syndrome    [facility]  . Cirrhosis ([place])   . Clostridium difficile infection 2017  . COPD (chronic obstructive pulmonary disease) (  H[facility])   . Daily headache   . Depression   . Diabetic neuropathy ([place]) 06/04/2017  . DJD (degenerative joint disease)   . Fatty liver disease, nonalcoholic   . Fibromyalgia   . Frequency of urination   . HCAP (healthcare-associated pneumonia) 08/26/2014  . History of TIA (transient ischemic attack) 11-01-2010   NO RESIDUAL  . Hyperlipidemia   . Hypertension   . Hypothyroidism   . IDDM (insulin dependent diabetes mellitus)   . Insomnia   . Lumbar stenosis   . Memory difficulty 07/25/2016  . Nocturia   . OSA (obstructive sleep apnea)    NO CPAP SINCE WT LOSS  . Osteoarthritis    with severe disease in knee  . Pneumonia "several times"  . Polymyalgia rheumatica ([place])   . Scoliosis   . Seasonal allergies   . Thyroid cancer ([place])   . Urgency of urination   . Vaginal pain S/P SLING  FEB 2012    Past Surgical History:  Procedure Laterality Date  . APPENDECTOMY  1982  . BREAST EXCISIONAL BIOPSY Left 02/28/2005   Atypical Ductal Hyperplasia  . CARDIAC CATHETERIZATION  09-04-2004   NORMAL CORONARY ANATOMY/ NORMAL LVF/ EF 60%  . CARDIOVASCULAR STRESS TEST  12-27-2010  DR [place]   ABNORMAL NUCLEAR STUDY W/ /MILD INFERIOR ISCHEMIA/ EF 69%/  CT HEART ANGIOGRAM ;  NO ACUTE FINDINGS  . CRYOABLATION  05/16/2003   w/LEEP FOR ABNORMAL PAP SMEAR   . CYSTOSCOPY  05/18/2012   Procedure: CYSTOSCOPY;  Surgeon: Reece Packer, MD;  Location[facility];  Service: Urology;  Laterality: N/A;  examination under anethesia  . ESOPHAGOGASTRODUODENOSCOPY (EGD) WITH PROPOFOL N/A 09/03/2016   Procedure: ESOPHAGOGASTRODUODENOSCOPY (EGD) WITH PROPOFOL;  Surgeon: Doran Stabler, MD;  Location: [facility];  Service: Gastroenterology;  Laterality: N/A;  . HYSTEROSCOPY WITH D & C  08-19-2007   PMB  . KNEE ARTHROSCOPY W/ DEBRIDEMENT Left 03/29/2006   INTERNAL DERANGEMENT/ SEVERE DJD/ MENISCUS TEARS  . LAPAROSCOPIC CHOLECYSTECTOMY  06-10-2002  . LAPAROSCOPIC GASTRIC BANDING  03/01/2006   TRUNCAL VAGOTOMY/ PLACEMENT OF VG BAND  . REVISION TOTAL KNEE ARTHROPLASTY Left 08-29-2008; 05/2009  . TONSILLECTOMY  1969  . TOTAL KNEE ARTHROPLASTY Left 01-23-2007   SEVERE DJD  . TOTAL THYROIDECTOMY  11-22-2005   BILATERAL THYROID NODULES-- PAPILLARY CARCINOMA (0.5CM)/ ADENOMATOID NODULES  . TRANSTHORACIC ECHOCARDIOGRAM  12-27-2010   LVSF NORMAL / EF 22-02%/ GRADE I DIASTOLIC DYSFUNCTION/ MILD MITRAL REGURG. / MILDLY DILATED LEFT ATRIUM/ MILDY INCREASED SYSTOLIC PRESSURE OF PULMONARY ARTERIES  . TRANSVAGINAL SUBURETERAL TAPE/ SLING  09-28-2010   MIXED URINARY INCONTINENCE  . TUBAL LIGATION  1983    Social History   Socioeconomic History  . Marital status: Married    Spouse name: Not on file  . Number of children: 2  . Years of education: 18  . Highest education level: Not on file  Occupational History  . Occupation: disabled    Fish farm manager: UNEMPLOYED  Tobacco Use  . Smoking status: Former Smoker    Packs/day: 2.50    Years: 39.00    Pack years: 97.50    Types: Cigarettes    Quit date: 10/22/2010    Years since quitting: 10.0  . Smokeless tobacco: Never Used  Vaping Use  . Vaping Use: Never used  Substance and Sexual Activity  . Alcohol use: No    Alcohol/week: 0.0 standard drinks  . Drug use: No  . Sexual activity: Not  Currently  Other Topics Concern  .  Not on file  Social History Narrative   Lives at home w/ her husband    Right-handed   Caffeine: 1 cup of coffee per week + [employer]   Social Determinants of Health   Financial Resource Strain: Medium Risk  . Difficulty of Paying Living Expenses: Somewhat hard  Food Insecurity: Not on file  Transportation Needs: Not on file  Physical Activity: Not on file  Stress: Not on file  Social Connections: Not on file    Family History  Problem Relation Age of Onset  . Diabetes Mother   . Heart disease Mother   . Dementia Mother   . Heart disease Father   . High blood pressure Father   . Colon cancer Maternal Uncle        x 2  . Breast cancer Other        great aunts x 5    Review of Systems  Constitutional: Negative for chills and fever.  Respiratory: Positive for shortness of breath. Negative for cough and wheezing.   Cardiovascular: Positive for chest pain (one episode 2-3 weeks ago - the day her son died - none since then) and leg swelling. Negative for palpitations.  Neurological: Positive for light-headedness (occ) and headaches.       Objective:   Vitals:   11/01/20 1342  BP: 140/88  Pulse: 82  Temp: 98 F (36.7 C)  SpO2: 95%   BP Readings from Last 3 Encounters:  11/01/20 140/88  10/13/20 128/82  08/29/20 101/65   Wt Readings from Last 3 Encounters:  11/01/20 268 lb (121.6 kg)  10/13/20 275 lb 3.2 oz (124.8 kg)  10/05/20 267 lb (121.1 kg)   Body mass index is 39.58 kg/m.   Physical Exam    Constitutional: Appears well-developed and well-nourished. No distress.  HENT:  Head: Normocephalic and atraumatic.  Neck: Neck supple. No tracheal deviation present. No thyromegaly present.  No cervical lymphadenopathy Cardiovascular: Normal rate, regular rhythm and normal heart sounds.   No murmur heard. No carotid bruit .  1 + pitting edema Pulmonary/Chest: Effort normal and breath sounds normal. No respiratory distress. No has  no wheezes. No rales.  Skin: Skin is warm and dry. Not diaphoretic.  Psychiatric: Normal mood and affect. Behavior is normal.      Assessment & Plan:    See Problem List for Assessment and Plan of chronic medical problems.    This visit occurred during the SARS-CoV-2 public health emergency.  Safety protocols were in place, including screening questions prior to the visit, additional usage of staff PPE, and extensive cleaning of exam room while observing appropriate contact time as indicated for disinfecting solutions.

## 2020-11-01 ENCOUNTER — Ambulatory Visit (INDEPENDENT_AMBULATORY_CARE_PROVIDER_SITE_OTHER): Payer: Medicare HMO | Admitting: Internal Medicine

## 2020-11-01 ENCOUNTER — Other Ambulatory Visit: Payer: Self-pay

## 2020-11-01 ENCOUNTER — Encounter: Payer: Self-pay | Admitting: Internal Medicine

## 2020-11-01 VITALS — BP 140/88 | HR 82 | Temp 98.0°F | Ht 69.0 in | Wt 268.0 lb

## 2020-11-01 DIAGNOSIS — E1042 Type 1 diabetes mellitus with diabetic polyneuropathy: Secondary | ICD-10-CM

## 2020-11-01 DIAGNOSIS — G4709 Other insomnia: Secondary | ICD-10-CM | POA: Diagnosis not present

## 2020-11-01 DIAGNOSIS — R69 Illness, unspecified: Secondary | ICD-10-CM | POA: Diagnosis not present

## 2020-11-01 DIAGNOSIS — E782 Mixed hyperlipidemia: Secondary | ICD-10-CM | POA: Diagnosis not present

## 2020-11-01 DIAGNOSIS — E559 Vitamin D deficiency, unspecified: Secondary | ICD-10-CM | POA: Diagnosis not present

## 2020-11-01 DIAGNOSIS — F3289 Other specified depressive episodes: Secondary | ICD-10-CM

## 2020-11-01 DIAGNOSIS — E890 Postprocedural hypothyroidism: Secondary | ICD-10-CM

## 2020-11-01 DIAGNOSIS — I5032 Chronic diastolic (congestive) heart failure: Secondary | ICD-10-CM | POA: Diagnosis not present

## 2020-11-01 DIAGNOSIS — E538 Deficiency of other specified B group vitamins: Secondary | ICD-10-CM

## 2020-11-01 DIAGNOSIS — F411 Generalized anxiety disorder: Secondary | ICD-10-CM

## 2020-11-01 DIAGNOSIS — I700 Atherosclerosis of aorta: Secondary | ICD-10-CM | POA: Diagnosis not present

## 2020-11-01 DIAGNOSIS — I10 Essential (primary) hypertension: Secondary | ICD-10-CM

## 2020-11-01 LAB — CBC WITH DIFFERENTIAL/PLATELET
Basophils Absolute: 0.1 10*3/uL (ref 0.0–0.1)
Basophils Relative: 0.7 % (ref 0.0–3.0)
Eosinophils Absolute: 0.2 10*3/uL (ref 0.0–0.7)
Eosinophils Relative: 2.8 % (ref 0.0–5.0)
HCT: 39.7 % (ref 36.0–46.0)
Hemoglobin: 13 g/dL (ref 12.0–15.0)
Lymphocytes Relative: 20.4 % (ref 12.0–46.0)
Lymphs Abs: 1.5 10*3/uL (ref 0.7–4.0)
MCHC: 32.7 g/dL (ref 30.0–36.0)
MCV: 77.7 fl — ABNORMAL LOW (ref 78.0–100.0)
Monocytes Absolute: 0.4 10*3/uL (ref 0.1–1.0)
Monocytes Relative: 4.9 % (ref 3.0–12.0)
Neutro Abs: 5.1 10*3/uL (ref 1.4–7.7)
Neutrophils Relative %: 71.2 % (ref 43.0–77.0)
Platelets: 132 10*3/uL — ABNORMAL LOW (ref 150.0–400.0)
RBC: 5.11 Mil/uL (ref 3.87–5.11)
RDW: 15.3 % (ref 11.5–15.5)
WBC: 7.1 10*3/uL (ref 4.0–10.5)

## 2020-11-01 LAB — COMPREHENSIVE METABOLIC PANEL
ALT: 17 U/L (ref 0–35)
AST: 34 U/L (ref 0–37)
Albumin: 3.8 g/dL (ref 3.5–5.2)
Alkaline Phosphatase: 79 U/L (ref 39–117)
BUN: 13 mg/dL (ref 6–23)
CO2: 34 mEq/L — ABNORMAL HIGH (ref 19–32)
Calcium: 9.5 mg/dL (ref 8.4–10.5)
Chloride: 98 mEq/L (ref 96–112)
Creatinine, Ser: 0.98 mg/dL (ref 0.40–1.20)
GFR: 61.05 mL/min (ref >60.00)
Glucose, Bld: 178 mg/dL — ABNORMAL HIGH (ref 70–99)
Potassium: 4.7 mEq/L (ref 3.5–5.1)
Sodium: 138 mEq/L (ref 135–145)
Total Bilirubin: 0.9 mg/dL (ref 0.2–1.2)
Total Protein: 7.4 g/dL (ref 6.0–8.3)

## 2020-11-01 LAB — LIPID PANEL
Cholesterol: 108 mg/dL (ref 0–200)
HDL: 30.1 mg/dL — ABNORMAL LOW (ref >39.00)
LDL Cholesterol: 49 mg/dL (ref 0–99)
NonHDL: 77.5
Total CHOL/HDL Ratio: 4
Triglycerides: 141 mg/dL (ref 0.0–149.0)
VLDL: 28.2 mg/dL (ref 0.0–40.0)

## 2020-11-01 NOTE — Addendum Note (Signed)
Addended by: Jacobo Forest on: 11/01/2020 02:27 PM   Modules accepted: Orders

## 2020-11-01 NOTE — Assessment & Plan Note (Signed)
Chronic Controlled, stable Continue paxil 40 mg daily

## 2020-11-01 NOTE — Assessment & Plan Note (Addendum)
Chronic Management per Dr Gherghe 

## 2020-11-01 NOTE — Assessment & Plan Note (Signed)
Chronic BP well controlled Continue metoprolol 25 mg BID, amlodipine 5 mg BID, lasix 40 mg daily, hydralazine 100 mg BID cmp   BP Readings from Last 3 Encounters:  11/01/20 140/88  10/13/20 128/82  08/29/20 101/65

## 2020-11-01 NOTE — Assessment & Plan Note (Signed)
Chronic Controlled, stable Continue trazodone 100 mg nightly,  Tizanidine 2 mg prn

## 2020-11-01 NOTE — Assessment & Plan Note (Signed)
Chronic  Lab Results  Component Value Date   HGBA1C 7.6 (A) 10/13/2020   Improved, but sugar still higher than ideal Stressed diabetic diet and weight loss Management per Dr. Cruzita Lederer

## 2020-11-01 NOTE — Assessment & Plan Note (Signed)
Chronic Check b12 level

## 2020-11-01 NOTE — Assessment & Plan Note (Addendum)
Chronic Stressed weight loss, healthy diet Not able to exercise Continue simvastatin 20 mg daily

## 2020-11-01 NOTE — Assessment & Plan Note (Signed)
Chronic Taking vitamin D daily Check vitamin D level  

## 2020-11-01 NOTE — Assessment & Plan Note (Signed)
Chronic Check lipid panel  Continue simvastatin 20 mg daily Regular exercise and healthy diet encouraged  

## 2020-11-01 NOTE — Assessment & Plan Note (Signed)
Chronic euvolemic Following with cardiology Continue current medications

## 2020-11-02 ENCOUNTER — Telehealth: Payer: Self-pay | Admitting: Pharmacist

## 2020-11-02 LAB — VITAMIN B12: Vitamin B-12: 446 pg/mL (ref 211–911)

## 2020-11-02 LAB — VITAMIN D 25 HYDROXY (VIT D DEFICIENCY, FRACTURES): VITD: 26.05 ng/mL — ABNORMAL LOW (ref 30.00–100.00)

## 2020-11-02 NOTE — Addendum Note (Signed)
Addended by: Charlton Haws on: 11/02/2020 04:33 PM   Modules accepted: Orders

## 2020-11-02 NOTE — Telephone Encounter (Signed)
Per chart Dr Cruzita Lederer would like to change Rybelsus to Ozempic 0.5 mg weekly. Pt will need to get Ozempic through patient assistance to begin treatment. Since patient is already approved through [employer], she does not need new application to change to Ozempic. Will coordinate sending Ozempic Rx to [employer].  Also, updated patient's medication list to remove meloxicam. Per [facility] pain mgmt notes, diclofenac is replacing meloxicam.

## 2020-11-02 NOTE — Progress Notes (Addendum)
Chronic Care Management Pharmacy Assistant   Name: Mary Acosta  MRN: [id number] DOB: Apr 04, 1956   Reason for Encounter: Medication Review    Recent office visits:  11/01/20 Dr. Quay Burow 10/13/20 Dr. Cruzita Lederer  Recent consult visits:  None  Hospital visits:  None in previous 6 months  Medications: Outpatient Encounter Medications as of 11/02/2020  Medication Sig Note   acetaminophen (TYLENOL) 500 MG tablet Take 1,000 mg by mouth 2 (two) times daily.    albuterol (VENTOLIN HFA) 108 (90 Base) MCG/ACT inhaler TAKE 2 PUFFS BY MOUTH EVERY 6 HOURS AS NEEDED FOR WHEEZE OR SHORTNESS OF BREATH    amitriptyline (ELAVIL) 25 MG tablet Take 75 mg by mouth at bedtime.    amLODipine (NORVASC) 5 MG tablet TAKE ONE TABLET BY MOUTH EVERY MORNING and TAKE ONE TABLET BY MOUTH EVERYDAY AT BEDTIME    aspirin EC 81 MG tablet Take 1 tablet (81 mg total) by mouth daily.    diclofenac (VOLTAREN) 50 MG EC tablet Take 50 mg by mouth 2 (two) times daily.    donepezil (ARICEPT) 10 MG tablet Take by mouth.    ezetimibe (ZETIA) 10 MG tablet Take by mouth.    furosemide (LASIX) 40 MG tablet TAKE ONE TABLET BY MOUTH dAILY    gabapentin (NEURONTIN) 300 MG capsule Take 300 mg by mouth 3 (three) times daily.  08/29/2020: As needed   hydrALAZINE (APRESOLINE) 100 MG tablet TAKE ONE TABLET BY MOUTH EVERY MORNING and TAKE ONE TABLET BY MOUTH EVERYDAY AT BEDTIME    hydrOXYzine (ATARAX/VISTARIL) 25 MG tablet Take 1 tablet (25 mg total) by mouth in the morning and at bedtime.    Insulin Glargine (BASAGLAR KWIKPEN) 100 UNIT/ML Inject 80 Units into the skin daily.    Insulin Pen Needle (PEN NEEDLES) 31G X 8 MM MISC 1 Device by Does not apply route daily.    Insulin Syringe-Needle U-100 26G X 1/2" 1 ML MISC Use daily for insulin injection as directed    ipratropium-albuterol (DUONEB) 0.5-2.5 (3) MG/3ML SOLN Take 3 mLs by nebulization every 6 (six) hours as needed (Wheezing or dyspnea.).    levothyroxine (SYNTHROID) 200  MCG tablet Take 1 tablet (200 mcg total) by mouth daily before breakfast.    meloxicam (MOBIC) 7.5 MG tablet Take 7.5 mg by mouth daily.    metFORMIN (GLUCOPHAGE) 500 MG tablet Take 1 tablet (500 mg total) by mouth 2 (two) times daily with a meal.    metoprolol tartrate (LOPRESSOR) 25 MG tablet Take 1 tablet (25 mg total) by mouth 2 (two) times daily.    morphine (MS CONTIN) 15 MG 12 hr tablet Take 15 mg by mouth daily.    morphine (MS CONTIN) 30 MG 12 hr tablet Take 30 mg by mouth 2 (two) times daily.    naloxone (NARCAN) nasal spray 4 mg/0.1 mL One spray in either nostril once for known/suspected opioid overdose. May repeat every 2-3 minutes in alternating nostril til EMS arrives    ondansetron (ZOFRAN-ODT) 4 MG disintegrating tablet Take 1 tablet (4 mg total) by mouth every 6 (six) hours as needed for nausea or vomiting.    PARoxetine (PAXIL) 40 MG tablet Take 1 tablet (40 mg total) by mouth daily.    potassium chloride SA (KLOR-CON M20) 20 MEQ tablet TAKE 2 TABLETS EVERY DAY AS NEEDED FOR CRAMPING 08/29/2020: 08/29/20 very rarely   Pseudoephedrine-Guaifenesin [phone] MG TB12 Take by mouth.    Semaglutide (RYBELSUS) 14 MG TABS Take by mouth.  Semaglutide (RYBELSUS) 7 MG TABS Take 7 mg by mouth daily. 30 min before breakfast with 4 oz water    Semaglutide,0.25 or 0.5MG /DOS, (OZEMPIC, 0.25 OR 0.5 MG/DOSE,) 2 MG/1.5ML SOPN Inject into the skin.    simvastatin (ZOCOR) 20 MG tablet TAKE ONE TABLET BY MOUTH EVERYDAY AT BEDTIME    tiZANidine (ZANAFLEX) 2 MG tablet Take 2 mg by mouth as needed for muscle spasms.     traZODone (DESYREL) 100 MG tablet TAKE ONE TABLET BY MOUTH EVERYDAY AT BEDTIME    No facility-administered encounter medications on file as of 11/02/2020.     Star Rating Drugs: NO ACE/ARB   Reviewed chart for medication changes ahead of medication coordination call.  No OVs, Consults, or hospital visits since last care coordination call/Pharmacist visit. (If appropriate, list visit  date, provider name)  No medication changes indicated OR if recent visit, treatment plan here.  BP Readings from Last 3 Encounters:  11/01/20 140/88  10/13/20 128/82  08/29/20 101/65    Lab Results  Component Value Date   HGBA1C 7.6 (A) 10/13/2020     Patient obtains medications through Adherence Packaging  30 Days   Last adherence delivery included:  Metoprolol Tartrate 25 mg 1 tab breakfast and 1 tab bedtime Metformin 500 mg BID - 1 tab breakfast and  1 tab bedtime Simvastatin 20 mg 1 tab bedtime Amlodipine 5 mg 1 tab breakfast and 1 tab bedtime Hydralazine 100 mg 1 tab breakfast and 1 tab bedtime Hydroxyzine Hcl 25 mg 1 tab breakfast and bedtime Amitriptyline 25 mg 3 tabs bedtime Levothyroxine 200 mcg 1-tab daily breakfast Paroxetine 40 mg 1 tab bedtime Gabapentin 300 mg 1 cap 3x daily as needed (vial) Furosemide 40 mg 1 tab daily - breakfast Tizanidine 2 mg 1 tab every 8 hrs. as needed (vial) Naloxone 4 mg as needed Meloxicam 7.5 mg 1 tab twice daily for pain (vial) Morphine ER 15 mg #30 (vial) Morphine ER 30 mg #60 (vial) Movantik 25 mg (vial)    Patient is due for next adherence delivery on: 11/03/20. Called patient and reviewed medications and coordinated delivery.  This delivery to include: Metoprolol Tartrate 25 mg 1 tab breakfast and 1 tab bedtime Metformin 500 mg BID - 1 tab breakfast and  1 tab bedtime Simvastatin 20 mg 1 tab bedtime Amlodipine 5 mg 1 tab breakfast and 1 tab bedtime Hydralazine 100 mg 1 tab breakfast and 1 tab bedti Amitriptyline 25 mg 3 tabs bedtime Levothyroxine 200 mcg 1-tab daily breakfast Paroxetine 40 mg 1 tab bedtime Furosemide 40 mg 1 tab daily - breakfast Tizanidine 2 mg 1 tab every 8 hrs. as needed (vial) Trazodone 100 mg 1 tab by mouth at bedtime   Patient needs refills for paroxentine from Dr. Quay Burow and trazodone from Dr. Leanord Hawking  Confirmed delivery date of 11/03/20, advised patient that pharmacy will contact them  the morning of delivery.    Wendy Poet, [place] [phone]   Time spent:40

## 2020-11-08 DIAGNOSIS — J449 Chronic obstructive pulmonary disease, unspecified: Secondary | ICD-10-CM | POA: Diagnosis not present

## 2020-11-09 DIAGNOSIS — J449 Chronic obstructive pulmonary disease, unspecified: Secondary | ICD-10-CM | POA: Diagnosis not present

## 2020-11-22 ENCOUNTER — Encounter: Payer: Self-pay | Admitting: Internal Medicine

## 2020-11-24 NOTE — Telephone Encounter (Signed)
Pt advised a sample box of Ozempic is at the office.

## 2020-11-28 ENCOUNTER — Telehealth: Payer: Self-pay | Admitting: Pharmacist

## 2020-11-28 NOTE — Progress Notes (Addendum)
Chronic Care Management Pharmacy Assistant   Name: Calypso Hagarty  MRN: [id number] DOB: 03/14/1956   Reason for Encounter: Medication Coordination Call    Recent office visits:  11/01/20 Dr. Quay Burow  Recent consult visits:  None ID  Hospital visits:  None in previous 6 months  Medications: Outpatient Encounter Medications as of 11/28/2020  Medication Sig Note   acetaminophen (TYLENOL) 500 MG tablet Take 1,000 mg by mouth 2 (two) times daily.    albuterol (VENTOLIN HFA) 108 (90 Base) MCG/ACT inhaler TAKE 2 PUFFS BY MOUTH EVERY 6 HOURS AS NEEDED FOR WHEEZE OR SHORTNESS OF BREATH    amitriptyline (ELAVIL) 25 MG tablet Take 75 mg by mouth at bedtime.    amLODipine (NORVASC) 5 MG tablet TAKE ONE TABLET BY MOUTH EVERY MORNING and TAKE ONE TABLET BY MOUTH EVERYDAY AT BEDTIME    aspirin EC 81 MG tablet Take 1 tablet (81 mg total) by mouth daily.    diclofenac (VOLTAREN) 50 MG EC tablet Take 50 mg by mouth 2 (two) times daily.    furosemide (LASIX) 40 MG tablet TAKE ONE TABLET BY MOUTH dAILY    gabapentin (NEURONTIN) 300 MG capsule Take 300 mg by mouth 3 (three) times daily.  08/29/2020: As needed   hydrALAZINE (APRESOLINE) 100 MG tablet TAKE ONE TABLET BY MOUTH EVERY MORNING and TAKE ONE TABLET BY MOUTH EVERYDAY AT BEDTIME    hydrOXYzine (ATARAX/VISTARIL) 25 MG tablet Take 1 tablet (25 mg total) by mouth in the morning and at bedtime. (Patient not taking: Reported on 11/02/2020)    Insulin Glargine (BASAGLAR KWIKPEN) 100 UNIT/ML Inject 80 Units into the skin daily.    Insulin Pen Needle (PEN NEEDLES) 31G X 8 MM MISC 1 Device by Does not apply route daily.    Insulin Syringe-Needle U-100 26G X 1/2" 1 ML MISC Use daily for insulin injection as directed    ipratropium-albuterol (DUONEB) 0.5-2.5 (3) MG/3ML SOLN Take 3 mLs by nebulization every 6 (six) hours as needed (Wheezing or dyspnea.).    levothyroxine (SYNTHROID) 200 MCG tablet Take 1 tablet (200 mcg total) by mouth daily before  breakfast.    metFORMIN (GLUCOPHAGE) 500 MG tablet Take 1 tablet (500 mg total) by mouth 2 (two) times daily with a meal.    metoprolol tartrate (LOPRESSOR) 25 MG tablet Take 1 tablet (25 mg total) by mouth 2 (two) times daily.    morphine (MS CONTIN) 15 MG 12 hr tablet Take 15 mg by mouth daily.    morphine (MS CONTIN) 30 MG 12 hr tablet Take 30 mg by mouth 2 (two) times daily.    naloxone (NARCAN) nasal spray 4 mg/0.1 mL One spray in either nostril once for known/suspected opioid overdose. May repeat every 2-3 minutes in alternating nostril til EMS arrives    ondansetron (ZOFRAN-ODT) 4 MG disintegrating tablet Take 1 tablet (4 mg total) by mouth every 6 (six) hours as needed for nausea or vomiting.    PARoxetine (PAXIL) 40 MG tablet Take 1 tablet (40 mg total) by mouth daily.    potassium chloride SA (KLOR-CON M20) 20 MEQ tablet TAKE 2 TABLETS EVERY DAY AS NEEDED FOR CRAMPING 08/29/2020: 08/29/20 very rarely   Pseudoephedrine-Guaifenesin [phone] MG TB12 Take by mouth.    Semaglutide (RYBELSUS) 14 MG TABS Take by mouth.    Semaglutide,0.25 or 0.5MG /DOS, (OZEMPIC, 0.25 OR 0.5 MG/DOSE,) 2 MG/1.5ML SOPN Inject 0.5 mg into the skin once a week. Begin once received through patient assistance. Continue Rybelsus 14 mg  until then. (Patient not taking: Reported on 11/02/2020)    simvastatin (ZOCOR) 20 MG tablet TAKE ONE TABLET BY MOUTH EVERYDAY AT BEDTIME    tiZANidine (ZANAFLEX) 2 MG tablet Take 2 mg by mouth as needed for muscle spasms.     traZODone (DESYREL) 100 MG tablet TAKE ONE TABLET BY MOUTH EVERYDAY AT BEDTIME    No facility-administered encounter medications on file as of 11/28/2020.    Reviewed chart for medication changes ahead of medication coordination call.  No OVs, Consults, or hospital visits since last care coordination call/Pharmacist visit.   No medication changes indicated   BP Readings from Last 3 Encounters:  11/01/20 140/88  10/13/20 128/82  08/29/20 101/65    Lab Results   Component Value Date   HGBA1C 7.6 (A) 10/13/2020     Patient obtains medications through Adherence Packaging  30 Days   Last adherence delivery included:  Metoprolol Tartrate 25 mg 1 tab breakfast and 1 tab bedtime Metformin 500 mg BID - 1 tab breakfast and  1 tab bedtime Simvastatin 20 mg 1 tab bedtime Amlodipine 5 mg 1 tab breakfast and 1 tab bedtime Hydralazine 100 mg 1 tab breakfast and 1 tab bedti Amitriptyline 25 mg 3 tabs bedtime (vial) Levothyroxine 200 mcg 1-tab daily breakfast Paroxetine 40 mg 1 tab bedtime Furosemide 40 mg 1 tab daily - breakfast Tizanidine 2 mg 1 tab every 8 hrs. as needed (vial) Trazodone 100 mg 1 tab by mouth at bedtime   Patient is due for next adherence delivery on: 12/01/20. Called patient and reviewed medications and coordinated delivery.  This delivery to include: Metoprolol Tartrate 25 mg 1 tab breakfast and 1 tab bedtime Metformin 500 mg BID - 1 tab breakfast and  1 tab bedtime Simvastatin 20 mg 1 tab bedtime Amlodipine 5 mg 1 tab breakfast and 1 tab bedtime Hydralazine 100 mg 1 tab breakfast and 1 tab bedti Amitriptyline 25 mg 3 tabs bedtime (vial) Levothyroxine 200 mcg 1-tab daily breakfast Diclofenac sodium 50 mg 1 tab twice daily Ondansetron 4 mg disslove 1 tab by mouth every 6 hours as needed Paroxetine 40 mg 1 tab bedtime Furosemide 40 mg 1 tab daily - breakfast Tizanidine 2 mg 1 tab every 8 hrs. as needed (vial) Trazodone 100 mg 1 tab by mouth at bedtime   Confirmed delivery date of 12/05/20, advised patient that pharmacy will contact them the morning of delivery.   [place] Pharmacist Assistant [phone]

## 2020-11-29 NOTE — Addendum Note (Signed)
Addended by: Charlton Haws on: 11/29/2020 05:16 PM   Modules accepted: Orders

## 2020-11-30 ENCOUNTER — Telehealth: Payer: Self-pay | Admitting: Internal Medicine

## 2020-11-30 NOTE — Telephone Encounter (Signed)
Spoke with patient, she actually is just looking for samples of Ozempic. Dr Cruzita Lederer is switching her from Rybelsus to [facility] and we are waiting for [facility] to mail her the [place]. In the meantime, it looks like Dr Arman Filter office has a sample set aside for her already. Patient informed and voiced understanding.  No further action needed

## 2020-11-30 NOTE — Telephone Encounter (Signed)
Patient was given a sample for ozempic which worked well for her. She is interested in switching to the ozempic instead of the metformin. Per patient she has been using the Nina already so she d/c the metformin for now.   Please advise and call back at [phone]

## 2020-11-30 NOTE — Telephone Encounter (Signed)
Since Dr Cruzita Lederer is now managing her diabetes she should discuss this with her office

## 2020-12-01 NOTE — Telephone Encounter (Signed)
Medication was picked up today

## 2020-12-08 DIAGNOSIS — M545 Low back pain, unspecified: Secondary | ICD-10-CM | POA: Diagnosis not present

## 2020-12-08 DIAGNOSIS — Z79890 Hormone replacement therapy (postmenopausal): Secondary | ICD-10-CM | POA: Diagnosis not present

## 2020-12-08 DIAGNOSIS — Z7984 Long term (current) use of oral hypoglycemic drugs: Secondary | ICD-10-CM | POA: Diagnosis not present

## 2020-12-08 DIAGNOSIS — M170 Bilateral primary osteoarthritis of knee: Secondary | ICD-10-CM | POA: Diagnosis not present

## 2020-12-08 DIAGNOSIS — Z87891 Personal history of nicotine dependence: Secondary | ICD-10-CM | POA: Diagnosis not present

## 2020-12-08 DIAGNOSIS — M533 Sacrococcygeal disorders, not elsewhere classified: Secondary | ICD-10-CM | POA: Diagnosis not present

## 2020-12-08 DIAGNOSIS — Z794 Long term (current) use of insulin: Secondary | ICD-10-CM | POA: Diagnosis not present

## 2020-12-08 DIAGNOSIS — Z79891 Long term (current) use of opiate analgesic: Secondary | ICD-10-CM | POA: Diagnosis not present

## 2020-12-08 DIAGNOSIS — Z79899 Other long term (current) drug therapy: Secondary | ICD-10-CM | POA: Diagnosis not present

## 2020-12-08 DIAGNOSIS — M1712 Unilateral primary osteoarthritis, left knee: Secondary | ICD-10-CM | POA: Diagnosis not present

## 2020-12-10 DIAGNOSIS — J449 Chronic obstructive pulmonary disease, unspecified: Secondary | ICD-10-CM | POA: Diagnosis not present

## 2020-12-13 ENCOUNTER — Telehealth: Payer: Self-pay

## 2020-12-13 NOTE — Progress Notes (Addendum)
[employer] cares and spoke with [place]. Patient Rx was changed from Rybelsus to Ozempic 0.5 mg and sent to the fulfillment center on 12/06/20 and will arrive in  10-14 business days.  Spoke with patient about an update for her Ozempic.

## 2020-12-18 DIAGNOSIS — E119 Type 2 diabetes mellitus without complications: Secondary | ICD-10-CM | POA: Diagnosis not present

## 2020-12-20 ENCOUNTER — Telehealth: Payer: Self-pay

## 2020-12-20 DIAGNOSIS — R69 Illness, unspecified: Secondary | ICD-10-CM | POA: Diagnosis not present

## 2020-12-20 DIAGNOSIS — E1165 Type 2 diabetes mellitus with hyperglycemia: Secondary | ICD-10-CM

## 2020-12-20 DIAGNOSIS — F419 Anxiety disorder, unspecified: Secondary | ICD-10-CM | POA: Diagnosis not present

## 2020-12-20 DIAGNOSIS — IMO0002 Reserved for concepts with insufficient information to code with codable children: Secondary | ICD-10-CM

## 2020-12-20 NOTE — Chronic Care Management (AMB) (Addendum)
Chronic Care Management Pharmacy Assistant   Name: Mary Acosta  MRN: [id number] DOB: 02/16/1956   Reason for Encounter: Medication Review/Medication Coordination Call    Recent office visits:  11/01/20-Stacy Quay Burow, MD(PCP)  Recent consult visits:  10/27/20- Barnet Glasgow (Pain Management)  Hospital visits:  None in previous 6 months  Medications: Outpatient Encounter Medications as of 12/20/2020  Medication Sig Note   acetaminophen (TYLENOL) 500 MG tablet Take 1,000 mg by mouth 2 (two) times daily.    albuterol (VENTOLIN HFA) 108 (90 Base) MCG/ACT inhaler TAKE 2 PUFFS BY MOUTH EVERY 6 HOURS AS NEEDED FOR WHEEZE OR SHORTNESS OF BREATH    amitriptyline (ELAVIL) 25 MG tablet Take 75 mg by mouth at bedtime.    amLODipine (NORVASC) 5 MG tablet TAKE ONE TABLET BY MOUTH EVERY MORNING and TAKE ONE TABLET BY MOUTH EVERYDAY AT BEDTIME    aspirin EC 81 MG tablet Take 1 tablet (81 mg total) by mouth daily.    diclofenac (VOLTAREN) 50 MG EC tablet Take 50 mg by mouth 2 (two) times daily.    furosemide (LASIX) 40 MG tablet TAKE ONE TABLET BY MOUTH dAILY    gabapentin (NEURONTIN) 300 MG capsule Take 300 mg by mouth 3 (three) times daily.  08/29/2020: As needed   hydrALAZINE (APRESOLINE) 100 MG tablet TAKE ONE TABLET BY MOUTH EVERY MORNING and TAKE ONE TABLET BY MOUTH EVERYDAY AT BEDTIME    hydrOXYzine (ATARAX/VISTARIL) 25 MG tablet Take 1 tablet (25 mg total) by mouth in the morning and at bedtime. (Patient not taking: Reported on 11/29/2020)    Insulin Glargine (BASAGLAR KWIKPEN) 100 UNIT/ML Inject 80 Units into the skin daily.    Insulin Pen Needle (PEN NEEDLES) 31G X 8 MM MISC 1 Device by Does not apply route daily.    Insulin Syringe-Needle U-100 26G X 1/2" 1 ML MISC Use daily for insulin injection as directed    ipratropium-albuterol (DUONEB) 0.5-2.5 (3) MG/3ML SOLN Take 3 mLs by nebulization every 6 (six) hours as needed (Wheezing or dyspnea.).    levothyroxine (SYNTHROID) 200 MCG  tablet Take 1 tablet (200 mcg total) by mouth daily before breakfast.    metFORMIN (GLUCOPHAGE) 500 MG tablet Take 1 tablet (500 mg total) by mouth 2 (two) times daily with a meal.    metoprolol tartrate (LOPRESSOR) 25 MG tablet Take 1 tablet (25 mg total) by mouth 2 (two) times daily.    morphine (MS CONTIN) 15 MG 12 hr tablet Take 15 mg by mouth daily.    morphine (MS CONTIN) 30 MG 12 hr tablet Take 30 mg by mouth 2 (two) times daily.    naloxone (NARCAN) nasal spray 4 mg/0.1 mL One spray in either nostril once for known/suspected opioid overdose. May repeat every 2-3 minutes in alternating nostril til EMS arrives    ondansetron (ZOFRAN-ODT) 4 MG disintegrating tablet Take 1 tablet (4 mg total) by mouth every 6 (six) hours as needed for nausea or vomiting.    PARoxetine (PAXIL) 40 MG tablet Take 1 tablet (40 mg total) by mouth daily.    potassium chloride SA (KLOR-CON M20) 20 MEQ tablet TAKE 2 TABLETS EVERY DAY AS NEEDED FOR CRAMPING (Patient not taking: Reported on 11/29/2020) 08/29/2020: 08/29/20 very rarely   Pseudoephedrine-Guaifenesin [phone] MG TB12 Take by mouth.    Semaglutide,0.25 or 0.5MG /DOS, (OZEMPIC, 0.25 OR 0.5 MG/DOSE,) 2 MG/1.5ML SOPN Inject 0.5 mg into the skin once a week. Begin once received through patient assistance. Continue Rybelsus 14 mg until  then.    simvastatin (ZOCOR) 20 MG tablet TAKE ONE TABLET BY MOUTH EVERYDAY AT BEDTIME    tiZANidine (ZANAFLEX) 2 MG tablet Take 2 mg by mouth as needed for muscle spasms.     traZODone (DESYREL) 100 MG tablet TAKE ONE TABLET BY MOUTH EVERYDAY AT BEDTIME    No facility-administered encounter medications on file as of 12/20/2020.    Reviewed chart for medication changes ahead of medication coordination call.  No OVs, Consults, or hospital visits since last care coordination call/Pharmacist visit. (If appropriate, list visit date, provider name)  No medication changes indicated OR if recent visit, treatment plan here.  BP Readings from  Last 3 Encounters:  11/01/20 140/88  10/13/20 128/82  08/29/20 101/65    Lab Results  Component Value Date   HGBA1C 7.6 (A) 10/13/2020     Patient obtains medications through Adherence Packaging  30 Days   Last adherence delivery included: Metoprolol Tartrate 25 mg 1 tab breakfast and 1 tab bedtime Metformin 500 mg BID - 1 tab breakfast and  1 tab bedtime Simvastatin 20 mg 1 tab bedtime Amlodipine 5 mg 1 tab breakfast and 1 tab bedtime Hydralazine 100 mg 1 tab breakfast and 1 tab bedti Amitriptyline 25 mg 3 tabs bedtime (vial) Levothyroxine 200 mcg 1-tab daily breakfast Diclofenac sodium 50 mg 1 tab twice daily Ondansetron 4 mg disslove 1 tab by mouth every 6 hours as needed Paroxetine 40 mg 1 tab bedtime Furosemide 40 mg 1 tab daily - breakfast Tizanidine 2 mg 1 tab every 8 hrs. as needed (vial) Trazodone 100 mg 1 tab by mouth at bedtime   Patient is due for next adherence delivery on: 12/29/20. Called patient and reviewed medications and coordinated delivery.  This delivery to include: Metoprolol Tartrate 25 mg 1 tab breakfast and 1 tab bedtime Metformin 500 mg BID - 1 tab breakfast and  1 tab bedtime Simvastatin 20 mg 1 tab bedtime Amlodipine 5 mg 1 tab breakfast and 1 tab bedtime Hydralazine 100 mg 1 tab breakfast and 1 tab bedti Levothyroxine 200 mcg 1-tab daily breakfast Diclofenac sodium 50 mg 1 tab twice daily Paroxetine 40 mg 1 tab bedtime Furosemide 40 mg 1 tab daily - breakfast Tizanidine 2 mg 1 tab every 8 hrs. as needed (vial) Trazodone 100 mg 1 tab by mouth at bedtime   Patient declined the following medications: Amitriptyline- she no longer takes this  Patient needs refills for: Furosemide 40 mg - CPP to request   Patient would like to start receiving diclofenac, tizanidine and furosemide in vials.  Confirmed delivery date of 12/29/20, advised patient that pharmacy will contact them the morning of delivery.   Lizbeth Bark Clinical  Pharmacist Assistant [phone]

## 2020-12-21 ENCOUNTER — Other Ambulatory Visit: Payer: Self-pay | Admitting: Internal Medicine

## 2020-12-21 DIAGNOSIS — I10 Essential (primary) hypertension: Secondary | ICD-10-CM

## 2020-12-21 DIAGNOSIS — G4733 Obstructive sleep apnea (adult) (pediatric): Secondary | ICD-10-CM

## 2020-12-22 MED ORDER — PEN NEEDLES 31G X 8 MM MISC: 1.0000 | Freq: Every day | 3 refills | Status: DC

## 2020-12-22 NOTE — Addendum Note (Signed)
Addended by: Charlton Haws on: 12/22/2020 03:47 PM   Modules accepted: Orders

## 2020-12-22 NOTE — Telephone Encounter (Addendum)
Received Ozempic patient assistance from [facility].  Placed in nurse room fridge - bottom shelf for patient to pick up.

## 2020-12-22 NOTE — Addendum Note (Signed)
Addended by: Charlton Haws on: 12/22/2020 05:07 PM   Modules accepted: Orders

## 2021-01-02 ENCOUNTER — Ambulatory Visit (INDEPENDENT_AMBULATORY_CARE_PROVIDER_SITE_OTHER): Payer: Medicare HMO | Admitting: Pharmacist

## 2021-01-02 ENCOUNTER — Other Ambulatory Visit: Payer: Self-pay

## 2021-01-02 DIAGNOSIS — I5032 Chronic diastolic (congestive) heart failure: Secondary | ICD-10-CM

## 2021-01-02 DIAGNOSIS — F3289 Other specified depressive episodes: Secondary | ICD-10-CM

## 2021-01-02 DIAGNOSIS — E782 Mixed hyperlipidemia: Secondary | ICD-10-CM

## 2021-01-02 DIAGNOSIS — I10 Essential (primary) hypertension: Secondary | ICD-10-CM

## 2021-01-02 DIAGNOSIS — Z794 Long term (current) use of insulin: Secondary | ICD-10-CM | POA: Diagnosis not present

## 2021-01-02 DIAGNOSIS — R69 Illness, unspecified: Secondary | ICD-10-CM | POA: Diagnosis not present

## 2021-01-02 DIAGNOSIS — G894 Chronic pain syndrome: Secondary | ICD-10-CM

## 2021-01-02 DIAGNOSIS — E1159 Type 2 diabetes mellitus with other circulatory complications: Secondary | ICD-10-CM | POA: Diagnosis not present

## 2021-01-02 DIAGNOSIS — IMO0002 Reserved for concepts with insufficient information to code with codable children: Secondary | ICD-10-CM

## 2021-01-02 DIAGNOSIS — E039 Hypothyroidism, unspecified: Secondary | ICD-10-CM

## 2021-01-02 DIAGNOSIS — E1165 Type 2 diabetes mellitus with hyperglycemia: Secondary | ICD-10-CM | POA: Diagnosis not present

## 2021-01-02 MED ORDER — LEVOTHYROXINE SODIUM 200 MCG PO TABS: ORAL_TABLET | ORAL | 0 refills | Status: DC

## 2021-01-02 NOTE — Patient Instructions (Signed)
Visit Information  Phone number for Pharmacist: [phone]  Goals Addressed            This Visit's Progress   . Manage My Medicine       Timeframe:  Long-Range Goal Priority:  High Start Date:      10/05/20                       Expected End Date:    04/04/21                   Follow Up Date 01/23/21   - call for medicine refill 2 or 3 days before it runs out - call if I am sick and can't take my medicine - keep a list of all the medicines I take; vitamins and herbals too - use [facility] for pill packaging - contact [name] with any medication issues - go to Endocrine lab for repeat thyroid labs   Why is this important?   . These steps will help you keep on track with your medicines.        Patient verbalizes understanding of instructions provided today and agrees to view in [place].  Telephone follow up appointment with pharmacy team member scheduled for: 3 months  Charlene Brooke, PharmD, [place], CPP Clinical Pharmacist [facility] [phone]

## 2021-01-02 NOTE — Progress Notes (Signed)
Chronic Care Management Pharmacy Note  01/02/2021 Name:  Cyrene Gharibian MRN:  [id number] DOB:  1956/08/02  Subjective: Mary Acosta is an 65 y.o. year old female who is a primary patient of Burns, Claudina Lick, MD.  The CCM team was consulted for assistance with disease management and care coordination needs.    Engaged with patient by telephone for follow up visit in response to provider referral for pharmacy case management and/or care coordination services.   Consent to Services:  The patient was given information about Chronic Care Management services, agreed to services, and gave verbal consent prior to initiation of services.  Please see initial visit note for detailed documentation.   Patient Care Team: Binnie Rail, MD as PCP - General (Internal Medicine) Stanford Breed Denice Bors, MD as PCP - Cardiology (Cardiology) Charlton Haws, [facility] as Pharmacist (Pharmacist)  Recent office visits: 11/01/20 Dr Quay Burow OV: chronic f/u. Her son died unexpectedly recently. Vit D slightly low, increase Vitamin D by 1000 IU.   09/01/20 Dr Quay Burow VV: URI, rx'd Z-pak.  Recent consult visits: 12/08/20 Dr Michail Sermon [facility] spine): SI joint injection.  10/13/20 Dr Cruzita Lederer (endocrine): A1c down to 7.6. change Rybelsus to Ozempic 0.5 mg. Reduce Levothyroxine to 200 mcg 6/7 days and 100 mcg 1/7 days  08/29/20 NP Ward Givens (neurology): f/u neuropathy, fibromyalgia, memory issues. She stopped donepezil previously due to cost. Memory score has declined since 2019. Ordered neuropsych testing before prescribing medication.  08/28/20 Dr Aram Candela [facility] pain mgmt): refilled morphine, continue meloxicam, gabapentin, tizanidine. Restart Amitiza BID. Decrease amitriptyline to 75 mg HS.  08/09/20 Leonia Reader (Nutrition): rec'd multivitamin. Add protein to breakfast. Stop regular Kool-aid, get unsweetened. Try flavored waters.  08/04/20 Dr Cruzita Lederer (endocrine): switched Rybelsus to [place] temporarily until PAP  approved. Reduced levothyroxine to 200 mcg x 6 days with 100 mcg (1/2 tab) on 7th day.  07/05/20 Dr Cruzita Lederer (endocrine): started metformin 500 mg BID.  Hospital visits: None in previous 6 months  Objective:  Lab Results  Component Value Date   CREATININE 0.98 11/01/2020   BUN 13 11/01/2020   GFR 61.05 11/01/2020   GFRNONAA 62 05/03/2020   GFRAA 72 05/03/2020   NA 138 11/01/2020   K 4.7 11/01/2020   CALCIUM 9.5 11/01/2020   CO2 34 (H) 11/01/2020    Lab Results  Component Value Date/Time   HGBA1C 7.6 (A) 10/13/2020 02:49 PM   HGBA1C 8.1 (A) 07/05/2020 12:06 PM   HGBA1C 10.4 (H) 05/03/2020 03:17 PM   HGBA1C 11.0 (H) 10/22/2019 02:11 PM   GFR 61.05 11/01/2020 02:27 PM   GFR 57.25 (L) 10/22/2019 02:11 PM   MICROALBUR <0.7 09/19/2017 02:26 PM   MICROALBUR 0.8 02/05/2017 11:09 AM    Last diabetic Eye exam:  Lab Results  Component Value Date/Time   HMDIABEYEEXA No Retinopathy 05/29/2016 12:00 AM    Last diabetic Foot exam: No results found for: HMDIABFOOTEX   Lab Results  Component Value Date   CHOL 108 11/01/2020   HDL 30.10 (L) 11/01/2020   LDLCALC 49 11/01/2020   TRIG 141.0 11/01/2020   CHOLHDL 4 11/01/2020    Hepatic Function Latest Ref Rng & Units 11/01/2020 05/03/2020 02/17/2020  Total Protein 6.0 - 8.3 g/dL 7.4 7.1 6.6  Albumin 3.5 - 5.2 g/dL 3.8 - 3.9  AST 0 - 37 U/L 34 22 25  ALT 0 - 35 U/L '17 16 12  ' Alk Phosphatase 39 - 117 U/L 79 - 115  Total Bilirubin 0.2 -  1.2 mg/dL 0.9 0.5 0.3  Bilirubin, Direct 0.00 - 0.40 mg/dL - - 0.13    Lab Results  Component Value Date/Time   TSH 0.14 (L) 08/04/2020 11:57 AM   TSH 4.42 05/03/2020 03:17 PM   FREET4 1.59 08/04/2020 11:57 AM   FREET4 0.93 12/26/2017 03:41 PM    CBC Latest Ref Rng & Units 11/01/2020 05/03/2020 10/22/2019  WBC 4.0 - 10.5 K/uL 7.1 8.9 7.8  Hemoglobin 12.0 - 15.0 g/dL 13.0 12.2 13.7  Hematocrit 36.0 - 46.0 % 39.7 39.9 43.1  Platelets 150.0 - 400.0 K/uL 132.0(L) 174 144.0(L)    Lab Results   Component Value Date/Time   VD25OH 26.05 (L) 11/01/2020 02:27 PM    Clinical ASCVD: Yes  The ASCVD Risk score Mikey Bussing DC Jr., et al., 2013) failed to calculate for the following reasons:   The patient has a prior MI or stroke diagnosis    Depression screen [facility] 2/9 01/28/2020 09/04/2018 06/13/2017  Decreased Interest 1 0 0  Down, Depressed, Hopeless 0 0 0  PHQ - 2 Score 1 0 0  Some recent data might be hidden     Social History   Tobacco Use  Smoking Status Former Smoker  . Packs/day: 2.50  . Years: 39.00  . Pack years: 97.50  . Types: Cigarettes  . Quit date: 10/22/2010  . Years since quitting: 10.2  Smokeless Tobacco Never Used   BP Readings from Last 3 Encounters:  11/01/20 140/88  10/13/20 128/82  08/29/20 101/65   Pulse Readings from Last 3 Encounters:  11/01/20 82  10/13/20 75  08/29/20 68   Wt Readings from Last 3 Encounters:  11/01/20 268 lb (121.6 kg)  10/13/20 275 lb 3.2 oz (124.8 kg)  10/05/20 267 lb (121.1 kg)   BMI Readings from Last 3 Encounters:  11/01/20 39.58 kg/m  10/13/20 40.64 kg/m  10/05/20 39.43 kg/m   Assessment/Interventions: Review of patient past medical history, allergies, medications, health status, including review of consultants reports, laboratory and other test data, was performed as part of comprehensive evaluation and provision of chronic care management services.   SDOH:  (Social Determinants of Health) assessments and interventions performed: Yes  SDOH Screenings   Alcohol Screen: Not on file  Depression (PHQ2-9): Low Risk   . PHQ-2 Score: 1  Financial Resource Strain: Medium Risk  . Difficulty of Paying Living Expenses: Somewhat hard  Food Insecurity: Not on file  Housing: Not on file  Physical Activity: Not on file  Social Connections: Not on file  Stress: Not on file  Tobacco Use: Medium Risk  . Smoking Tobacco Use: Former Smoker  . Smokeless Tobacco Use: Never Used  Transportation Needs: Not on file    CCM Care  Plan  Allergies  Allergen Reactions  . Gabapentin Swelling    Swelling in legs Swelling in legs Swelling in legs  . Losartan Other (See Comments)    Myalgias and muscle cramping Other reaction(s): Other (See Comments) Myalgias and muscle cramping Myalgias and muscle cramping Other reaction(s): Other (See Comments) Myalgias and muscle cramping  . Aricept [Donepezil Hcl]     Nausea/vomiting, low BP  . Oxycodone Itching  . Sulfa Antibiotics Nausea Only and Rash  . Sulfonamide Derivatives Nausea Only    Medications Reviewed Today    Reviewed by Charlton Haws, [facility] (Pharmacist) on 01/02/21 at 1420  Med List Status: <None>  Medication Order Taking? Sig Documenting Provider Last Dose Status Informant  acetaminophen (TYLENOL) 500 MG tablet [id number] Yes Take 1,000  mg by mouth 2 (two) times daily. [provider] Taking Active Self  albuterol (VENTOLIN HFA) 108 (90 Base) MCG/ACT inhaler [id number] Yes TAKE 2 PUFFS BY MOUTH EVERY 6 HOURS AS NEEDED FOR WHEEZE OR SHORTNESS OF BREATH Burns, Claudina Lick, MD Taking Active Self  amLODipine (NORVASC) 5 MG tablet [id number] Yes TAKE ONE TABLET BY MOUTH EVERY MORNING and TAKE ONE TABLET BY MOUTH EVERYDAY AT BEDTIME Binnie Rail, MD Taking Active   aspirin EC 81 MG tablet [id number] Yes Take 1 tablet (81 mg total) by mouth daily. Lelon Perla, MD Taking Active Self  diclofenac (VOLTAREN) 50 MG EC tablet [id number] Yes Take 50 mg by mouth 2 (two) times daily. [provider] Taking Active   furosemide (LASIX) 40 MG tablet [id number] Yes TAKE ONE TABLET BY MOUTH EVERY MORNING Burns, Claudina Lick, MD Taking Active   gabapentin (NEURONTIN) 300 MG capsule [id number] Yes Take 300 mg by mouth 3 (three) times daily.  [provider] Taking Active Self           Med Note Jacobo Forest, Leilani Able   Tue Aug 29, 2020  7:46 AM) As needed  hydrALAZINE (APRESOLINE) 100 MG tablet [id number] Yes TAKE ONE TABLET BY MOUTH EVERY MORNING and TAKE ONE TABLET  BY MOUTH EVERYDAY AT BEDTIME Binnie Rail, MD Taking Active   hydrOXYzine (ATARAX/VISTARIL) 25 MG tablet [id number] Yes Take 1 tablet (25 mg total) by mouth in the morning and at bedtime. Binnie Rail, MD Taking Active   Insulin Glargine [facility]) 100 UNIT/ML [id number] Yes Inject 65 Units into the skin daily. [provider] Taking Active   Insulin Pen Needle (PEN NEEDLES) 31G X 8 MM MISC [id number] Yes 1 Device by Does not apply route daily. Binnie Rail, MD Taking Active   Insulin Syringe-Needle U-100 26G X 1/2" 1 ML MISC [id number] Yes Use daily for insulin injection as directed Burns, Claudina Lick, MD Taking Active   ipratropium-albuterol (DUONEB) 0.5-2.5 (3) MG/3ML SOLN [id number] Yes Take 3 mLs by nebulization every 6 (six) hours as needed (Wheezing or dyspnea.). Brand Males, MD Taking Active   levothyroxine (SYNTHROID) 200 MCG tablet [id number] Yes Take 200 mcg 6 days a week and 100 mcg (1/2 tablet) 1 day a week Burns, Claudina Lick, MD Taking Active   metFORMIN (GLUCOPHAGE) 500 MG tablet [id number] Yes Take 1 tablet (500 mg total) by mouth 2 (two) times daily with a meal. Philemon Kingdom, MD Taking Active   metoprolol tartrate (LOPRESSOR) 25 MG tablet [id number] Yes Take 1 tablet (25 mg total) by mouth 2 (two) times daily. Lelon Perla, MD Taking Active   morphine (MS CONTIN) 15 MG 12 hr tablet [id number] Yes Take 15 mg by mouth daily. [provider] Taking Active   morphine (MS CONTIN) 30 MG 12 hr tablet [id number] Yes Take 30 mg by mouth 2 (two) times daily. [provider] Taking Active Self           Med Note Rosemarie Beath, MELISSA B   Sun Dec 03, 2015  3:39 PM)    naloxone [facility]) nasal spray 4 mg/0.1 mL [id number] Yes One spray in either nostril once for known/suspected opioid overdose. May repeat every 2-3 minutes in alternating nostril til EMS arrives [provider] Taking Active   ondansetron (ZOFRAN-ODT) 4 MG disintegrating tablet [id number] Yes  Take 1 tablet (4 mg total) by mouth every 6 (six) hours as needed for nausea or vomiting. Doran Stabler, MD Taking Active  PARoxetine (PAXIL) 40 MG tablet [id number] Yes Take 1 tablet (40 mg total) by mouth daily. Binnie Rail, MD Taking Active   potassium chloride SA (KLOR-CON M20) 20 MEQ tablet [id number] Yes TAKE 2 TABLETS EVERY DAY AS NEEDED FOR CRAMPING Binnie Rail, MD Taking Active            Med Note Linus Orn Aug 29, 2020  7:47 AM) 08/29/20 very rarely  Pseudoephedrine-Guaifenesin [phone] MG TB12 [id number] Yes Take by mouth. [provider] Taking Active   Semaglutide,0.25 or 0.5MG/DOS, (OZEMPIC, 0.25 OR 0.5 MG/DOSE,) 2 MG/1.5ML SOPN [id number] Yes Inject 0.5 mg into the skin once a week. Begin once received through patient assistance. Continue Rybelsus 14 mg until then. [provider] Taking Active   simvastatin (ZOCOR) 20 MG tablet [id number] Yes TAKE ONE TABLET BY MOUTH EVERYDAY AT BEDTIME Erlene Quan, PA-C Taking Active   tiZANidine (ZANAFLEX) 2 MG tablet [id number] Yes Take 2 mg by mouth as needed for muscle spasms.  [provider] Taking Active Self           Med Note Spero Curb, GREG A   Mon Jul 10, 2015  4:55 PM)    traZODone (DESYREL) 150 MG tablet [id number] Yes Take by mouth at bedtime. [provider] Taking Active           Patient Active Problem List   Diagnosis Date Noted  . Aortic atherosclerosis ([place]) 11/01/2020  . URI (upper respiratory infection) 09/01/2020  . Uncontrolled type 2 diabetes mellitus with circulatory disorder, with long-term current use of insulin ([place]) 07/05/2020  . PSVT (paroxysmal supraventricular tachycardia) ([place]) 05/31/2020  . UTI (urinary tract infection) 03/01/2020  . Grief 02/03/2019  . Nausea 01/04/2019  . Reactive airway disease 10/21/2018  . Arthralgia 09/04/2018  . Thyroid cancer ([place]) 04/30/2018  . Obesity hypoventilation syndrome ([place]) 09/24/2017  . Palpitations 09/18/2017  .  Diabetic neuropathy ([place]) 06/04/2017  . Vitamin B12 deficiency 03/05/2017  . Numbness and tingling in both hands 02/24/2017  . OSA (obstructive sleep apnea) 01/31/2017  . Liver cirrhosis secondary to NASH ([place]) 01/31/2017  . Hypothyroidism 01/31/2017  . Chronic narcotic use 01/31/2017  . Fibromyalgia 01/31/2017  . Hyperlipidemia 01/31/2017  . Gastroparesis   . Memory disorder 07/25/2016  . Lump in neck 01/29/2016  . Chronic diastolic (congestive) heart failure ([place]) 01/03/2016  . Fatty liver 01/03/2016  . Type 1 diabetes mellitus ([place]) 01/03/2016  . Recurrent Clostridium difficile diarrhea 01/03/2016  . Chronic respiratory failure ([place]) 11/10/2014  . Physical deconditioning 09/26/2014  . Dyspnea 09/26/2014  . Iron deficiency anemia, unspecified  04/05/2011  . Bariatric surgery status 04/05/2011  . Left arm pain   . Vitamin D deficiency 10/22/2007  . LOW BACK PAIN, CHRONIC 10/22/2007  . INSOMNIA 10/22/2007  . Obesity 10/14/2007  . Chronic pain syndrome 10/14/2007  . CARPAL TUNNEL SYNDROME 10/14/2007  . ALLERGIC RHINITIS CAUSE UNSPECIFIED 10/14/2007  . ARTHRITIS 10/14/2007  . Anxiety state 08/25/2007  . Depression 08/25/2007  . Essential hypertension 08/25/2007  . ASTHMA 08/25/2007  . CONSTIPATION 08/25/2007  . POLYMYALGIA RHEUMATICA 08/25/2007  . LEG EDEMA, BILATERAL 08/25/2007    Immunization History  Administered Date(s) Administered  . Hep A / Hep B 02/05/2017  . Hepatitis B, adult 03/05/2017, 08/14/2017  . Influenza,inj,Quad PF,6+ Mos 05/27/2015, 05/30/2016, 06/13/2017, 05/05/2018, 05/22/2019, 05/03/2020  . Influenza,inj,quad, With Preservative 04/26/2018, 05/24/2019  . Influenza-Unspecified 05/26/2014  . Moderna Sars-Covid-2 Vaccination 10/05/2019, 11/02/2019  . Pneumococcal Conjugate-13 10/23/2015  . Pneumococcal  Polysaccharide-23 07/10/2014    Conditions to be addressed/monitored:  Hypertension, Hyperlipidemia, Diabetes, Hypothyroidism, Depression, Anxiety  and Pain management  Care Plan : [place]  Updates made by Charlton Haws, [place] since 01/02/2021 12:00 AM    Problem: Hypertension, Hyperlipidemia, Diabetes, Hypothyroidism, Depression, Anxiety and Pain management   Priority: High    Long-Range Goal: Disease management   Start Date: 10/05/2020  Expected End Date: 04/04/2021  Recent Progress: On track  Priority: High  Note:   Current Barriers:  . Unable to independently afford treatment regimen . Unable to independently monitor therapeutic efficacy . Unable to maintain control of diabetes  Pharmacist Clinical Goal(s):  Marland Kitchen Patient will verbalize ability to afford treatment regimen . achieve adherence to monitoring guidelines and medication adherence to achieve therapeutic efficacy . maintain control of diabetes as evidenced by improvement in blood sugar  through collaboration with PharmD and provider.   Interventions: . 1:1 collaboration with Binnie Rail, MD regarding development and update of comprehensive plan of care as evidenced by provider attestation and co-signature . Inter-disciplinary care team collaboration (see longitudinal plan of care) . Comprehensive medication review performed; medication list updated in electronic medical record  Hypertension / Diastolic heart failure (BP goal < 130/80) Controlled - per pt report Current regimen:  ? amlodipine 5 mg twice a day ? spironolactone 25 mg daily as needed for high BP ? Hydralazine 100 mg twice a day ? metoprolol tartrate 25 mg twice a day ? Furosemide 20 mg as needed for swelling Interventions: ? Discussed benefits of medications and importance of adherence  ? Recommend to continue current medication   Hyperlipidemia (LDL goal < 70) Controlled - most recent LDL was below goal; pt denies muscle aches with simvastatin alone Current regimen:  ? Simvastatin 20 mg daily  ? Aspirin 81 mg daily Interventions: ? Discussed cholesterol goals and benefits  of medications for prevention of heart attack / stroke ? Previously stopped ezetimibe due to patient complaints of muscle aches with combination simvastatin + ezetimibe ? Recommend to continue current medication   Diabetes (A1c goal < 7%) Not ideally controlled - but improving with recent additions of metformin and Ozempic; Patient has lost 13 lbs since most recent Jan 4th appt (now 267 lbs); she reports using less insulin and BG rarely above 200 Home BG: 110-130 in AM, 160-175 evening Current regimen:  ? Basaglar 65 units once a day ? Metformin 500 mg BID ? Ozempic 0.5 mg weekly Interventions: ? Discussed A1c goal and benefits of maintaining blood sugar at goal ? Basaglar approved via [employer] through 08/25/21 ? Ozempic approved via [place] through 07/25/21   Chronic pain / Arthritis Relatively controlled currently - per pt report Current regimen:  ? morphine ER 30 mg every 12 hours ? Morphine IR 15 mg daily ? Tylenol 1000 mg twice a day ? tizanidine 2 mg every 6 hrs as needed ? Gabapentin 300 mg 3 times daily as needed - pt tries to limit use as it causes leg swelling ? Diclofenac Interventions: ? Discussed risk for oversedation given multiple sedating medications. Pt denies this issue and follows with [facility] pain management regularly. Patient self care activities ? Continue medication as prescribed ? Discuss reducing morphine dose with [facility] pain management  Depression/Anxiety/Insomnia Controlled - pt has stopped amitriptyline and is now taking higher dose of trazodone; she rarely uses hydroxyzine; she reports sleeping well enough considering recent loss of her son unexpectedly Current regimen:  o Paroxetine 40  mg daily o Trazodone 150 mg HS o Hydroxyzine 25 mg BID prn (rare use) Interventions: o Recommend to continue current medication  Hypothyroidism (goal: maintain TSH in goal range) Not ideally controlled - most recent TSH from December 2021 was low, pt was advised to  reduce dose by 1/2 tab once a week; she did not reduce dose until February and has not returned for repeat thryoid panel yet Current regimen:  o Levothyroxine 200 mcg daily except 1/2 tab on Sundays Interventions: o Discussed recent low TSH indicating hyperthyroid state; the plan was to recheck thyroid levels 6 weeks after appt in Feb. o Advised patient to visit Endocrine lab for repeat thyroid levels (orders already entered previously)  Patient Goals/Self-Care Activities . Patient will:  - take medications as prescribed focus on medication adherence by pill packs check glucose twice daily, document, and provide at future appointments check blood pressure daily, document, and provide at future appointments collaborate with provider on medication access solutions -go to endocrine lab to recheck thyroid levels  Follow Up Plan: Telephone follow up appointment with care management team member scheduled for: 3 months      Medication Assistance: Ozempic [employer]) - enrollment ends 07/25/21 Basaglar [employer]) - enrollment ends 08/25/21  Patient's preferred pharmacy is:  [facility] - [place], [place] - [address] [place] [address] Phone: [phone] Fax: [phone]  Uses pill box? Yes - pill packs Pt endorses 100% compliance  We discussed: Reviewed patient's [facility] medication and Epic medication profile assuring there are no discrepancies or gaps in therapy. Confirmed all fill dates appropriate and verified with patient that there is a sufficient quantity of all prescribed medications at home. Informed patient to call me any time if needing medications before scheduled deliveries.   Patient decided to: Utilize [facility] pharmacy for medication synchronization, packaging and delivery  Care Plan and Follow Up Patient Decision:  Patient agrees to Care Plan and Follow-up.  Plan: Telephone follow up appointment with  care management team member scheduled for:  3 months  Charlene Brooke, PharmD, [place], CPP Clinical Pharmacist [place] [facility] at [facility] [phone]

## 2021-01-09 DIAGNOSIS — J449 Chronic obstructive pulmonary disease, unspecified: Secondary | ICD-10-CM | POA: Diagnosis not present

## 2021-01-11 ENCOUNTER — Other Ambulatory Visit: Payer: Self-pay

## 2021-01-11 ENCOUNTER — Encounter: Payer: Medicare HMO | Attending: Psychology | Admitting: Psychology

## 2021-01-11 DIAGNOSIS — F0781 Postconcussional syndrome: Secondary | ICD-10-CM | POA: Diagnosis not present

## 2021-01-11 DIAGNOSIS — R413 Other amnesia: Secondary | ICD-10-CM | POA: Diagnosis not present

## 2021-01-11 DIAGNOSIS — R69 Illness, unspecified: Secondary | ICD-10-CM | POA: Diagnosis not present

## 2021-01-11 DIAGNOSIS — G894 Chronic pain syndrome: Secondary | ICD-10-CM | POA: Diagnosis not present
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
## 2021-01-15 ENCOUNTER — Other Ambulatory Visit: Payer: Self-pay

## 2021-01-15 ENCOUNTER — Other Ambulatory Visit: Payer: Self-pay | Admitting: Cardiology

## 2021-01-15 ENCOUNTER — Ambulatory Visit (INDEPENDENT_AMBULATORY_CARE_PROVIDER_SITE_OTHER): Payer: Medicare HMO | Admitting: Internal Medicine

## 2021-01-15 ENCOUNTER — Encounter: Payer: Self-pay | Admitting: Internal Medicine

## 2021-01-15 VITALS — BP 128/78 | HR 70 | Ht 69.0 in | Wt 260.6 lb

## 2021-01-15 DIAGNOSIS — IMO0002 Reserved for concepts with insufficient information to code with codable children: Secondary | ICD-10-CM

## 2021-01-15 DIAGNOSIS — E1165 Type 2 diabetes mellitus with hyperglycemia: Secondary | ICD-10-CM | POA: Diagnosis not present

## 2021-01-15 DIAGNOSIS — E1159 Type 2 diabetes mellitus with other circulatory complications: Secondary | ICD-10-CM | POA: Diagnosis not present

## 2021-01-15 DIAGNOSIS — E039 Hypothyroidism, unspecified: Secondary | ICD-10-CM | POA: Diagnosis not present

## 2021-01-15 DIAGNOSIS — Z794 Long term (current) use of insulin: Secondary | ICD-10-CM

## 2021-01-15 LAB — POCT GLYCOSYLATED HEMOGLOBIN (HGB A1C): Hemoglobin A1C: 6.9 % — AB (ref 4.0–5.6)

## 2021-01-15 NOTE — Patient Instructions (Addendum)
Please continue: - Metformin 1000 mg with dinner - Ozempic 0.5 mg weekly in a.m.   Please continue Levothyroxine 200 mcg 6/7 days and 100 mcg 1/7 days.  Take the thyroid hormone every day, with water, at least 30 minutes before breakfast, separated by at least 4 hours from: - acid reflux medications - calcium - iron - multivitamins  Please stop Biotin and come back for labs in 4-5 days.  Please return in 3 months with your sugar log.

## 2021-01-15 NOTE — Progress Notes (Addendum)
Patient ID: Mary Acosta, female   DOB: 03/16/1956, 65 y.o.   MRN: [id number]   This visit occurred during the SARS-CoV-2 public health emergency.  Safety protocols were in place, including screening questions prior to the visit, additional usage of staff PPE, and extensive cleaning of exam room while observing appropriate contact time as indicated for disinfecting solutions.    HPI: Mary Acosta is a 65 y.o.-year-old female, initially referred by her PCP, Dr. Quay Burow, returning for follow-up for DM2, dx in 2000, insulin-dependent since 2011, uncontrolled, with long-term complications (peripheral neuropathy, gastroparesis, cerebrovascular disease with history of TIA 2012, DKA 2015) and also history of thyroid cancer and postsurgical hypothyroidism.  She previously saw my colleague, Dr. Loanne Drilling, up to 07/2019.  Last visit with me 3 mo ago.  Interim hx: No CP, blurry vision. She has increased urination. She has occasional nausea. She lost 2 sons: 2020 and 09/2020. She is grieving. She sees psychiatry and takes Paxil.  She feels that this is working.  DM2: Reviewed HbA1c levels: Lab Results  Component Value Date   HGBA1C 7.6 (A) 10/13/2020   HGBA1C 8.1 (A) 07/05/2020   HGBA1C 10.4 (H) 05/03/2020   HGBA1C 11.0 (H) 10/22/2019   HGBA1C 9.4 (H) 07/20/2019   HGBA1C 8.8 (H) 04/01/2019   HGBA1C 10.5 (A) 10/08/2018   HGBA1C 10.8 (H) 09/04/2018   HGBA1C 9.9 (H) 06/01/2018   HGBA1C 9.8 (A) 04/30/2018   She was previously on: -  Coca-[employer]a >> stopped 1 mo ago!!! - Metformin 1000 mg with dinner - Rybelsus 14 mg before b'fast  >> Ozempic 0.5 mg weekly She did not start Ozempic yet as advised 07/2020. Prev. On Rybelsus 3 mg before breakfast-see patient assistance pgm >> at last OV I recommended 7 mg -she did not receive this yet She was on N and R insulin in the past. She was on metformin in the past >> did not work.  Pt checks her sugars 2-3 times a day: - am: 140-150 on  Rybelsus 3 mg; up to 200 off Rybelsus >> 75, 200-225 >> 125-225 >> 110-125 - 2h after b'fast: n/c - before lunch: n/c - 2h after lunch: n/c - before dinner: 130-150 on Rybelsus 3 mg; up to 250 off Rybelsus >> 200-225 >> 150-170 >> 130-150 - 2h after dinner: n/c - bedtime: n/c >> 200s >> 130s - nighttime: n/c Lowest sugar was 75 >> 110; she has hypoglycemia awareness in the 70s. Highest sugar was 600... >> 200.  Glucometer: AccuChek, Prodigy  Pt's meals are: - Breakfast: bacon or sausage and eggs, cereal  - stopped - Lunch: salad; hot dog - Dinner: meat + veggies - Snacks: not so much anymore Previously drinking Kool-Aid, now stopped  She is status post gastric banding with vagotomy in 2008.  She lost 148 pounds afterwards but then gained more than 100 pounds back.  No h/o pancreatitis or FH of MTC.  No CKD, last BUN/creatinine:  Lab Results  Component Value Date   BUN 13 11/01/2020   BUN 14 05/03/2020   CREATININE 0.98 11/01/2020   CREATININE 0.97 05/03/2020  Not on ACE inhibitor/ARB.  + HL; last set of lipids: Lab Results  Component Value Date   CHOL 108 11/01/2020   HDL 30.10 (L) 11/01/2020   LDLCALC 49 11/01/2020   TRIG 141.0 11/01/2020   CHOLHDL 4 11/01/2020  On Zocor 20, Zetia 10.  - last eye exam was in 09/2020 reportedly: no DR. Stopped IO injections for macular  degeneration.  -She has pain, numbness, tingling in her feet.  On ASA 81.  Pt has FH of DM in mother.  Thyroid cancer:  Reviewed her thyroid cancer history: She is status post total thyroidectomy - [place] Sx 2007. 11/22/2005: THYROID, THYROIDECTOMY:  - PAPILLARY THYROID CARCINOMA (0.5 CM).  - ADENOMATOID NODULES.   COMMENT  ONCOLOGY TABLE-THYROID   1. Maximum tumor size (cm): 0.5 cm  2. Tumor location: Right mid lobe  3. Multifocality: Not identified  4. Histology: Papillary thyroid carcinoma  5. Margins: Negative  6. Capsular invasion: Present  7. Extrathyroidal  extension: Not identified  8. Vascular/Lymphatic invasion: Not identified  9. Lymph nodes: N/A  10. TNM code: pT1, pNX, pMX  11. Comments: Examination of the thyroidectomy specimen  grossly demonstrates multiple nodules. Histologic evaluation  demonstrates numerous adenomatoid nodules. One nodule measuring  0.5 cm demonstrates features compatible with papillary thyroid  carcinoma. The surgical margins are negative. (MS:jy) 11/25/05   Neck U/S (10/25/2020): no abnormal neck masses  Postsurgical hypothyroidism:  She is currently on 200 mcg 6/7 days and 100 mcg 1/7 days (dose decreased at last OV, but she did not return for labs as advised): - in am - fasting - at least 30 min from b'fast - no calcium - no iron - no multivitamins - no PPIs - not on Biotin  Reviewed her TFTs: Lab Results  Component Value Date   TSH 0.14 (L) 08/04/2020   TSH 4.42 05/03/2020   TSH 1.50 10/22/2019   TSH 1.09 07/20/2019   TSH 1.32 04/01/2019   TSH 0.55 09/04/2018   TSH 0.90 12/26/2017   TSH 0.113 (L) 12/08/2017   TSH 0.22 (L) 09/19/2017   TSH 0.62 06/13/2017   TSH 1.43 01/22/2017   TSH 6.67 (H) 12/10/2016   TSH 1.12 01/29/2016   TSH 3.320 08/28/2014   TSH 5.470 (H) 07/10/2014   Pt denies: - feeling nodules in neck - hoarseness - dysphagia - choking - SOB with lying down  Not on steroids or biotin.  She also has a history of COPD, OSA, fibromyalgia, depression, fatty liver-cirrhosis, DDD, h/o vb fx, PMR.  She is seen in pain clinic in [facility].  She is on Neurontin and amitriptyline.   ROS: Constitutional: no weight gain/+ weight loss (15 lbs), + fatigue, no subjective hyperthermia, no subjective hypothermia Eyes: no blurry vision, no xerophthalmia ENT: no sore throat, + see HPI Cardiovascular: no CP/+ SOB/no palpitations/+ leg swelling Respiratory: no cough/+ SOB/no wheezing Gastrointestinal: no N/no V/no D/no C/no acid reflux Musculoskeletal:  + muscle and joint  aches Skin: no rashes, no hair loss Neurological: + tremors/no numbness/no tingling/no dizziness  I reviewed pt's medications, allergies, PMH, social hx, family hx, and changes were documented in the history of present illness. Otherwise, unchanged from my initial visit note.  Past Medical History:  Diagnosis Date  . AKI (acute kidney injury) ([place]) 01/2017  . Anxiety    with panic attacks  . Arthritis    "back; feet; hands; shoulders" (08/26/2014)  . Asthma   . Cervical cancer ([place])   . Chronic lower back pain   . Chronic narcotic use   . Chronic pain syndrome    [facility]  . Cirrhosis ([place])   . Clostridium difficile infection 2017  . COPD (chronic obstructive pulmonary disease) ([place])   . Daily headache   . Depression   . Diabetic neuropathy ([place]) 06/04/2017  . DJD (degenerative joint disease)   . Fatty liver  disease, nonalcoholic   . Fibromyalgia   . Frequency of urination   . HCAP (healthcare-associated pneumonia) 08/26/2014  . History of TIA (transient ischemic attack) 11-01-2010   NO RESIDUAL  . Hyperlipidemia   . Hypertension   . Hypothyroidism   . IDDM (insulin dependent diabetes mellitus)   . Insomnia   . Lumbar stenosis   . Memory difficulty 07/25/2016  . Nocturia   . OSA (obstructive sleep apnea)    NO CPAP SINCE WT LOSS  . Osteoarthritis    with severe disease in knee  . Pneumonia "several times"  . Polymyalgia rheumatica ([place])   . Scoliosis   . Seasonal allergies   . Thyroid cancer ([place])   . Urgency of urination   . Vaginal pain S/P SLING  FEB 2012   Past Surgical History:  Procedure Laterality Date  . APPENDECTOMY  1982  . BREAST EXCISIONAL BIOPSY Left 02/28/2005   Atypical Ductal Hyperplasia  . CARDIAC CATHETERIZATION  09-04-2004   NORMAL CORONARY ANATOMY/ NORMAL LVF/ EF 60%  . CARDIOVASCULAR STRESS TEST  12-27-2010  DR [place]   ABNORMAL NUCLEAR STUDY W/ /MILD INFERIOR ISCHEMIA/ EF 69%/  CT HEART ANGIOGRAM ;  NO ACUTE FINDINGS  .  CRYOABLATION  05/16/2003   w/LEEP FOR ABNORMAL PAP SMEAR  . CYSTOSCOPY  05/18/2012   Procedure: CYSTOSCOPY;  Surgeon: Reece Packer, MD;  Location: [facility];  Service: Urology;  Laterality: N/A;  examination under anethesia  . ESOPHAGOGASTRODUODENOSCOPY (EGD) WITH PROPOFOL N/A 09/03/2016   Procedure: ESOPHAGOGASTRODUODENOSCOPY (EGD) WITH PROPOFOL;  Surgeon: Doran Stabler, MD;  Location: [facility];  Service: Gastroenterology;  Laterality: N/A;  . HYSTEROSCOPY WITH D & C  08-19-2007   PMB  . KNEE ARTHROSCOPY W/ DEBRIDEMENT Left 03/29/2006   INTERNAL DERANGEMENT/ SEVERE DJD/ MENISCUS TEARS  . LAPAROSCOPIC CHOLECYSTECTOMY  06-10-2002  . LAPAROSCOPIC GASTRIC BANDING  03/01/2006   TRUNCAL VAGOTOMY/ PLACEMENT OF VG BAND  . REVISION TOTAL KNEE ARTHROPLASTY Left 08-29-2008; 05/2009  . TONSILLECTOMY  1969  . TOTAL KNEE ARTHROPLASTY Left 01-23-2007   SEVERE DJD  . TOTAL THYROIDECTOMY  11-22-2005   BILATERAL THYROID NODULES-- PAPILLARY CARCINOMA (0.5CM)/ ADENOMATOID NODULES  . TRANSTHORACIC ECHOCARDIOGRAM  12-27-2010   LVSF NORMAL / EF [id number] GRADE I DIASTOLIC DYSFUNCTION/ MILD MITRAL REGURG. / MILDLY DILATED LEFT ATRIUM/ MILDY INCREASED SYSTOLIC PRESSURE OF PULMONARY ARTERIES  . TRANSVAGINAL SUBURETERAL TAPE/ SLING  09-28-2010   MIXED URINARY INCONTINENCE  . TUBAL LIGATION  1983   Social History   Socioeconomic History  . Marital status: Married    Spouse name: Not on file  . Number of children: 2  . Years of education: 68  . Highest education level: Not on file  Occupational History  . Occupation: disabled    Fish farm manager: UNEMPLOYED  Tobacco Use  . Smoking status: Former Smoker    Packs/day: 2.50    Years: 39.00    Pack years: 97.50    Types: Cigarettes    Quit date: 10/22/2010    Years since quitting: 10.2  . Smokeless tobacco: Never Used  Vaping Use  . Vaping Use: Never used  Substance and Sexual Activity  . Alcohol use: No    Alcohol/week: 0.0 standard  drinks  . Drug use: No  . Sexual activity: Not Currently  Other Topics Concern  . Not on file  Social History Narrative   Lives at home w/ her husband    Right-handed   Caffeine: 1 cup of coffee per week + Pepsi  Social Determinants of Health   Financial Resource Strain: Medium Risk  . Difficulty of Paying Living Expenses: Somewhat hard  Food Insecurity: Not on file  Transportation Needs: Not on file  Physical Activity: Not on file  Stress: Not on file  Social Connections: Not on file  Intimate Partner Violence: Not on file   Current Outpatient Medications on File Prior to Visit  Medication Sig Dispense Refill  . acetaminophen (TYLENOL) 500 MG tablet Take 1,000 mg by mouth 2 (two) times daily.    Marland Kitchen albuterol (VENTOLIN HFA) 108 (90 Base) MCG/ACT inhaler TAKE 2 PUFFS BY MOUTH EVERY 6 HOURS AS NEEDED FOR WHEEZE OR SHORTNESS OF BREATH 18 g 11  . amLODipine (NORVASC) 5 MG tablet TAKE ONE TABLET BY MOUTH EVERY MORNING and TAKE ONE TABLET BY MOUTH EVERYDAY AT BEDTIME 180 tablet 2  . aspirin EC 81 MG tablet Take 1 tablet (81 mg total) by mouth daily. 90 tablet 3  . diclofenac (VOLTAREN) 50 MG EC tablet Take 50 mg by mouth 2 (two) times daily.    . furosemide (LASIX) 40 MG tablet TAKE ONE TABLET BY MOUTH EVERY MORNING 30 tablet 5  . gabapentin (NEURONTIN) 300 MG capsule Take 300 mg by mouth 3 (three) times daily.     . hydrALAZINE (APRESOLINE) 100 MG tablet TAKE ONE TABLET BY MOUTH EVERY MORNING and TAKE ONE TABLET BY MOUTH EVERYDAY AT BEDTIME 180 tablet 2  . hydrOXYzine (ATARAX/VISTARIL) 25 MG tablet Take 1 tablet (25 mg total) by mouth in the morning and at bedtime. 180 tablet 1  . Insulin Glargine (BASAGLAR KWIKPEN) 100 UNIT/ML Inject 65 Units into the skin daily.    . Insulin Pen Needle (PEN NEEDLES) 31G X 8 MM MISC 1 Device by Does not apply route daily. 90 each 3  . Insulin Syringe-Needle U-100 26G X 1/2" 1 ML MISC Use daily for insulin injection as directed 100 each 3  .  ipratropium-albuterol (DUONEB) 0.5-2.5 (3) MG/3ML SOLN Take 3 mLs by nebulization every 6 (six) hours as needed (Wheezing or dyspnea.). 360 mL 11  . levothyroxine (SYNTHROID) 200 MCG tablet Take 200 mcg 6 days a week and 100 mcg (1/2 tablet) 1 day a week 90 tablet 0  . metFORMIN (GLUCOPHAGE) 500 MG tablet Take 1 tablet (500 mg total) by mouth 2 (two) times daily with a meal. 180 tablet 3  . morphine (MS CONTIN) 15 MG 12 hr tablet Take 15 mg by mouth daily.    Marland Kitchen morphine (MS CONTIN) 30 MG 12 hr tablet Take 30 mg by mouth 2 (two) times daily.    . naloxone (NARCAN) nasal spray 4 mg/0.1 mL One spray in either nostril once for known/suspected opioid overdose. May repeat every 2-3 minutes in alternating nostril til EMS arrives    . ondansetron (ZOFRAN-ODT) 4 MG disintegrating tablet Take 1 tablet (4 mg total) by mouth every 6 (six) hours as needed for nausea or vomiting. 30 tablet 3  . PARoxetine (PAXIL) 40 MG tablet Take 1 tablet (40 mg total) by mouth daily. 90 tablet 1  . potassium chloride SA (KLOR-CON M20) 20 MEQ tablet TAKE 2 TABLETS EVERY DAY AS NEEDED FOR CRAMPING 180 tablet 1  . Pseudoephedrine-Guaifenesin [phone] MG TB12 Take by mouth.    . Semaglutide,0.25 or 0.5MG /DOS, (OZEMPIC, 0.25 OR 0.5 MG/DOSE,) 2 MG/1.5ML SOPN Inject 0.5 mg into the skin once a week. Begin once received through patient assistance. Continue Rybelsus 14 mg until then.    . simvastatin (ZOCOR) 20 MG  tablet TAKE ONE TABLET BY MOUTH EVERYDAY AT BEDTIME 30 tablet 6  . tiZANidine (ZANAFLEX) 2 MG tablet Take 2 mg by mouth as needed for muscle spasms.   2  . traZODone (DESYREL) 150 MG tablet Take by mouth at bedtime.     No current facility-administered medications on file prior to visit.   Allergies  Allergen Reactions  . Gabapentin Swelling    Swelling in legs Swelling in legs Swelling in legs  . Losartan Other (See Comments)    Myalgias and muscle cramping Other reaction(s): Other (See Comments) Myalgias and muscle  cramping Myalgias and muscle cramping Other reaction(s): Other (See Comments) Myalgias and muscle cramping  . Aricept [Donepezil Hcl]     Nausea/vomiting, low BP  . Oxycodone Itching  . Sulfa Antibiotics Nausea Only and Rash  . Sulfonamide Derivatives Nausea Only   Family History  Problem Relation Age of Onset  . Diabetes Mother   . Heart disease Mother   . Dementia Mother   . Heart disease Father   . High blood pressure Father   . Colon cancer Maternal Uncle        x 2  . Breast cancer Other        great aunts x 5    PE: BP 128/78 (BP Location: Right Arm, Patient Position: Sitting, Cuff Size: Normal)   Pulse 70   Ht 5\' 9"  (1.753 m)   Wt 260 lb 9.6 oz (118.2 kg)   SpO2 93%   BMI 38.48 kg/m  Wt Readings from Last 3 Encounters:  01/15/21 260 lb 9.6 oz (118.2 kg)  11/01/20 268 lb (121.6 kg)  10/13/20 275 lb 3.2 oz (124.8 kg)   Constitutional: overweight, in NAD Eyes: PERRLA, EOMI, no exophthalmos ENT: moist mucous membranes, no thyromegaly, no cervical lymphadenopathy Cardiovascular: RRR, No MRG Respiratory: CTA B Gastrointestinal: abdomen soft, NT, ND, BS+ Musculoskeletal: no deformities, strength intact in all 4 Skin: moist, warm, no rashes Neurological: no tremor with outstretched hands, DTR normal in all 4  ASSESSMENT: 1. DM2, insulin-dependent, uncontrolled, with complications  2.  History of thyroid cancer  3.  Postsurgical hypothyroidism  PLAN:  1. Patient with longstanding, uncontrolled, type 2 diabetes, on metformin and now weekly GLP-1 receptor agonist, restarted at last visit.  Her sugars improved significantly after starting this and, at this visit, she tells me that she stopped her insulin (80 units) approximately a month ago.  She is occasionally using few units when sugars are high. -At this visit, reviewing her blood sugars, they are much improved, only slightly above target before dinner.  We discussed about not using insulin on a as needed basis,  I believe she could manage the blood sugars without insulin completely.  We will continue Ozempic at the same dose but we can increase it at next visit if needed. - I suggested to:  Patient Instructions  Please continue: - Metformin 1000 mg with dinner - Ozempic 0.5 mg weekly in a.m.   Please continue Levothyroxine 200 mcg 6/7 days and 100 mcg 1/7 days.  Take the thyroid hormone every day, with water, at least 30 minutes before breakfast, separated by at least 4 hours from: - acid reflux medications - calcium - iron - multivitamins  Please stop Biotin and come back for labs in 4-5 days.  Please return in 3 months with your sugar log.   - we checked her HbA1c: 6.9% (better) - advised to check sugars at different times of the day - 1x a  day, rotating check times - advised for yearly eye exams >> she is UTD - return to clinic in 3 months  2.  History of thyroid cancer -Per review of the chart, she had a papillary thyroid cancer focus in the right thyroid lobe, measuring 0.5 cm.  There was no vascular, lymphatic, with direct extension into the thyroid tissue. -No neck compression symptoms -After last visit, we checked another neck ultrasound that did not show any suspicious masses -She is most likely cured of her thyroid cancer  3.  Postsurgical hypothyroidism - latest thyroid labs reviewed with pt >> suppressed: Lab Results  Component Value Date   TSH 0.14 (L) 08/04/2020   - she continues on LT4 200 mcg 6 out of 7 days of 100 mcg 1 out of 7 days.  We decreased the dose at last visit.  She did not return for labs afterwards. - pt feels good on this dose. - we discussed about taking the thyroid hormone every day, with water, >30 minutes before breakfast, separated by >4 hours from acid reflux medications, calcium, iron, multivitamins. Pt. is taking it correctly. - will check thyroid tests in several days, since she just took biotin this morning: TSH and fT4 - If labs are abnormal,  she will need to return for repeat TFTs in 1.5 months  Philemon Kingdom, MD PhD [facility]

## 2021-01-15 NOTE — Addendum Note (Signed)
Addended by: Lauralyn Primes on: 01/15/2021 05:12 PM   Modules accepted: Orders

## 2021-01-17 DIAGNOSIS — R69 Illness, unspecified: Secondary | ICD-10-CM | POA: Diagnosis not present

## 2021-01-17 DIAGNOSIS — F419 Anxiety disorder, unspecified: Secondary | ICD-10-CM | POA: Diagnosis not present

## 2021-01-26 DIAGNOSIS — Z0289 Encounter for other administrative examinations: Secondary | ICD-10-CM | POA: Diagnosis not present

## 2021-01-26 DIAGNOSIS — G894 Chronic pain syndrome: Secondary | ICD-10-CM | POA: Diagnosis not present

## 2021-01-26 DIAGNOSIS — M5137 Other intervertebral disc degeneration, lumbosacral region: Secondary | ICD-10-CM | POA: Diagnosis not present

## 2021-01-26 DIAGNOSIS — M533 Sacrococcygeal disorders, not elsewhere classified: Secondary | ICD-10-CM | POA: Diagnosis not present

## 2021-01-26 DIAGNOSIS — Z79891 Long term (current) use of opiate analgesic: Secondary | ICD-10-CM | POA: Diagnosis not present

## 2021-02-02 ENCOUNTER — Other Ambulatory Visit (INDEPENDENT_AMBULATORY_CARE_PROVIDER_SITE_OTHER): Payer: Medicare HMO

## 2021-02-02 ENCOUNTER — Other Ambulatory Visit: Payer: Self-pay

## 2021-02-02 DIAGNOSIS — E039 Hypothyroidism, unspecified: Secondary | ICD-10-CM | POA: Diagnosis not present

## 2021-02-02 LAB — T4, FREE: Free T4: 1.43 ng/dL (ref 0.60–1.60)

## 2021-02-02 LAB — TSH: TSH: 0.51 u[IU]/mL (ref 0.35–4.50)

## 2021-02-09 DIAGNOSIS — J449 Chronic obstructive pulmonary disease, unspecified: Secondary | ICD-10-CM | POA: Diagnosis not present

## 2021-02-15 DIAGNOSIS — J449 Chronic obstructive pulmonary disease, unspecified: Secondary | ICD-10-CM | POA: Diagnosis not present

## 2021-02-16 ENCOUNTER — Other Ambulatory Visit: Payer: Self-pay | Admitting: Internal Medicine

## 2021-02-16 DIAGNOSIS — E039 Hypothyroidism, unspecified: Secondary | ICD-10-CM

## 2021-02-20 ENCOUNTER — Telehealth: Payer: Self-pay | Admitting: Pharmacist

## 2021-02-20 NOTE — Progress Notes (Signed)
Chronic Care Management Pharmacy Assistant   Name: Analis Distler MRN: [id number] DOB: 05/11/1956  Reason for Encounter: Medication Coordination Call  Medications: Outpatient Encounter Medications as of 02/20/2021  Medication Sig Note   acetaminophen (TYLENOL) 500 MG tablet Take 1,000 mg by mouth 2 (two) times daily.    albuterol (VENTOLIN HFA) 108 (90 Base) MCG/ACT inhaler TAKE 2 PUFFS BY MOUTH EVERY 6 HOURS AS NEEDED FOR WHEEZE OR SHORTNESS OF BREATH    amLODipine (NORVASC) 5 MG tablet TAKE ONE TABLET BY MOUTH EVERY MORNING and TAKE ONE TABLET BY MOUTH EVERYDAY AT BEDTIME    aspirin EC 81 MG tablet Take 1 tablet (81 mg total) by mouth daily.    diclofenac (VOLTAREN) 50 MG EC tablet Take 50 mg by mouth 2 (two) times daily.    furosemide (LASIX) 40 MG tablet TAKE ONE TABLET BY MOUTH EVERY MORNING    gabapentin (NEURONTIN) 300 MG capsule Take 300 mg by mouth 3 (three) times daily.  08/29/2020: As needed   hydrALAZINE (APRESOLINE) 100 MG tablet TAKE ONE TABLET BY MOUTH EVERY MORNING and TAKE ONE TABLET BY MOUTH EVERYDAY AT BEDTIME    hydrOXYzine (ATARAX/VISTARIL) 25 MG tablet Take 1 tablet (25 mg total) by mouth in the morning and at bedtime.    Insulin Pen Needle (PEN NEEDLES) 31G X 8 MM MISC 1 Device by Does not apply route daily.    Insulin Syringe-Needle U-100 26G X 1/2" 1 ML MISC Use daily for insulin injection as directed    ipratropium-albuterol (DUONEB) 0.5-2.5 (3) MG/3ML SOLN Take 3 mLs by nebulization every 6 (six) hours as needed (Wheezing or dyspnea.).    levothyroxine (SYNTHROID) 200 MCG tablet TAKE ONE TABLET BY MOUTH monday-saturday before breakfast and TAKE 1/2 TABLET BY MOUTH ONCE WEEKLY BEFORE BREAKFAST ON SUNDAYS    metFORMIN (GLUCOPHAGE) 500 MG tablet Take 1 tablet (500 mg total) by mouth 2 (two) times daily with a meal.    metoprolol tartrate (LOPRESSOR) 25 MG tablet TAKE ONE TABLET BY MOUTH EVERY MORNING and TAKE ONE TABLET BY MOUTH EVERYDAY AT BEDTIME     morphine (MS CONTIN) 15 MG 12 hr tablet Take 15 mg by mouth daily.    morphine (MS CONTIN) 30 MG 12 hr tablet Take 30 mg by mouth 2 (two) times daily.    naloxone (NARCAN) nasal spray 4 mg/0.1 mL One spray in either nostril once for known/suspected opioid overdose. May repeat every 2-3 minutes in alternating nostril til EMS arrives    ondansetron (ZOFRAN-ODT) 4 MG disintegrating tablet Take 1 tablet (4 mg total) by mouth every 6 (six) hours as needed for nausea or vomiting.    PARoxetine (PAXIL) 40 MG tablet Take 1 tablet (40 mg total) by mouth daily.    potassium chloride SA (KLOR-CON M20) 20 MEQ tablet TAKE 2 TABLETS EVERY DAY AS NEEDED FOR CRAMPING 08/29/2020: 08/29/20 very rarely   Pseudoephedrine-Guaifenesin [phone] MG TB12 Take by mouth.    Semaglutide,0.25 or 0.5MG /DOS, (OZEMPIC, 0.25 OR 0.5 MG/DOSE,) 2 MG/1.5ML SOPN Inject 0.5 mg into the skin once a week. Begin once received through patient assistance. Continue Rybelsus 14 mg until then.    simvastatin (ZOCOR) 20 MG tablet TAKE ONE TABLET BY MOUTH EVERYDAY AT BEDTIME    tiZANidine (ZANAFLEX) 2 MG tablet Take 2 mg by mouth as needed for muscle spasms.     traZODone (DESYREL) 150 MG tablet Take by mouth at bedtime.    No facility-administered encounter medications on file as of 02/20/2021.  Reviewed chart for medication changes ahead of medication coordination call.  No OVs, Consults, or hospital visits since last care coordination call/Pharmacist visit. (If appropriate, list visit date, provider name)  No medication changes indicated OR if recent visit, treatment plan here.  BP Readings from Last 3 Encounters:  01/15/21 128/78  11/01/20 140/88  10/13/20 128/82    Lab Results  Component Value Date   HGBA1C 6.9 (A) 01/15/2021     Patient obtains medications through Adherence Packaging  30 Days   Last adherence delivery included:  Metoprolol Tartrate 25 mg 1 tab breakfast and 1 tab bedtime Metformin 500 mg BID - 1 tab breakfast  and  1 tab bedtime Simvastatin 20 mg 1 tab bedtime Amlodipine 5 mg 1 tab breakfast and 1 tab bedtime Hydralazine 100 mg 1 tab breakfast and 1 tab bedti Levothyroxine 200 mcg 1-tab daily breakfast Diclofenac sodium 50 mg 1 tab twice daily Paroxetine 40 mg 1 tab bedtime Furosemide 40 mg 1 tab daily - breakfast Tizanidine 2 mg 1 tab every 8 hrs. as needed (vial) Trazodone 100 mg 1 tab by mouth at bedtime  Patient is due for next adherence delivery on: 03/01/21. Called patient and reviewed medications and coordinated delivery.  This delivery to include: Metoprolol Tartrate 25 mg 1 tab breakfast and 1 tab bedtime Metformin 500 mg BID - 1 tab breakfast and  1 tab bedtime Simvastatin 20 mg 1 tab bedtime Amlodipine 5 mg 1 tab breakfast and 1 tab bedtime Hydralazine 100 mg 1 tab breakfast and 1 tab bedti Levothyroxine 200 mcg 1-tab daily breakfast Diclofenac sodium 50 mg 1 tab twice daily Paroxetine 40 mg 1 tab bedtime Furosemide 40 mg 1 tab daily - breakfast Tizanidine 2 mg 1 tab every 8 hrs. as needed (vial) Morphine 15 mg ER - (vial)  Confirmed delivery date of 03/01/21, advised patient that pharmacy will contact them the morning of delivery.  Star Rating Drugs: Metformin -  last fill 01/25/21 30D Simvastatin - last fill 01/25/21 30D Ozempic - last fill 11/02/20 (Patient was switched from [place])   Orinda Kenner, [facility] Assistant [phone]  Time Spent: [phone]

## 2021-03-05 ENCOUNTER — Other Ambulatory Visit: Payer: Self-pay | Admitting: Internal Medicine

## 2021-03-06 ENCOUNTER — Telehealth (INDEPENDENT_AMBULATORY_CARE_PROVIDER_SITE_OTHER): Payer: Medicare HMO | Admitting: Internal Medicine

## 2021-03-06 ENCOUNTER — Encounter: Payer: Self-pay | Admitting: Internal Medicine

## 2021-03-06 DIAGNOSIS — K7460 Unspecified cirrhosis of liver: Secondary | ICD-10-CM

## 2021-03-06 DIAGNOSIS — R739 Hyperglycemia, unspecified: Secondary | ICD-10-CM

## 2021-03-06 DIAGNOSIS — R443 Hallucinations, unspecified: Secondary | ICD-10-CM

## 2021-03-06 DIAGNOSIS — K7581 Nonalcoholic steatohepatitis (NASH): Secondary | ICD-10-CM

## 2021-03-06 NOTE — Progress Notes (Signed)
Virtual Visit via Video Note  I connected with Mary Acosta on 03/06/21 at  9:20 AM EDT by a video enabled telemedicine application and verified that I am speaking with the correct person using two identifiers.  The patient and the provider were at separate locations throughout the entire encounter. Patient location: home, Provider location: work   I discussed the limitations of evaluation and management by telemedicine and the availability of in person appointments. The patient expressed understanding and agreed to proceed. The patient and the provider were the only parties present for the visit unless noted in HPI below.  History of Present Illness: The patient is a 65 y.o. female with visit for diarrhea. States 20 BM per day when having and maybe several times per week. Takes imodium for this and pepto bismol and this helps some. Having some pain in the stomach after eating on the right side middle stomach. She takes gas pills and this helps and her nausea medicine. She does also have some hallucinations which are visual in nature seeing people that are not there. Has past history of recurrent C dif diarrhea. Seems similar to that. She does also have cirrhosis due to NASH. Last visit with GI 02/2020. Has had change in medications and recently stopped amitriptyline unclear when. Since has been urinating a lot. Denies antibiotics in the past 6 months. Having high sugars almost to 600 and close to that lately. This is not normal for her.   Observations/Objective: Appearance: sleepy, lying down, breathing appears normal, no coughing during visit, casual grooming, abdomen does appear distended, throat not visualized, mental status is A and O times 3  Assessment and Plan: See problem oriented charting  Follow Up Instructions: needs to go to ER for high sugars, hallucinations, diarrhea, highly elevated glucose levels  I discussed the assessment and treatment plan with the patient. The patient was  provided an opportunity to ask questions and all were answered. The patient agreed with the plan and demonstrated an understanding of the instructions.   The patient was advised to call back or seek an in-person evaluation if the symptoms worsen or if the condition fails to improve as anticipated.  Hoyt Koch, MD

## 2021-03-06 NOTE — Assessment & Plan Note (Signed)
Concern for new hallucinations with cirrhosis and hyperglycemia and diarrhea for encephalopathy, DKA/HHS, or C dif. Needs urgent evaluation as she is high risk for decline in health and advised to go to ER.

## 2021-03-06 NOTE — Assessment & Plan Note (Signed)
Concern for HHS or DKA given sugars to 600 recently combined with diarrhea and hallucinations. She is at high risk for poor health outcome. She is asked to go to ER immediately. She states husband will take her and declines offer to call EMS for her.

## 2021-03-06 NOTE — Assessment & Plan Note (Signed)
Unclear if her recent hallucinations are coming from encephalopathy. She is advised she needs urgent face to face evaluation and sent to ER.

## 2021-03-11 DIAGNOSIS — J449 Chronic obstructive pulmonary disease, unspecified: Secondary | ICD-10-CM | POA: Diagnosis not present

## 2021-03-13 ENCOUNTER — Encounter: Payer: Medicare HMO | Attending: Psychology

## 2021-03-13 DIAGNOSIS — F0781 Postconcussional syndrome: Secondary | ICD-10-CM | POA: Insufficient documentation

## 2021-03-13 DIAGNOSIS — G894 Chronic pain syndrome: Secondary | ICD-10-CM | POA: Insufficient documentation

## 2021-03-13 DIAGNOSIS — R413 Other amnesia: Secondary | ICD-10-CM | POA: Insufficient documentation

## 2021-03-15 ENCOUNTER — Telehealth: Payer: Self-pay

## 2021-03-15 NOTE — Telephone Encounter (Signed)
pt is asking for something to be sent on for bladder spasms.  **Pt states if she needs to be seen please give her a call.  *Sent to [facility].

## 2021-03-16 MED ORDER — MIRABEGRON ER 25 MG PO TB24: 25.0000 mg | ORAL_TABLET | Freq: Every day | ORAL | 5 refills | Status: DC

## 2021-03-16 NOTE — Telephone Encounter (Signed)
Ok for myrbetrix 25 mg daily  - done erx

## 2021-03-16 NOTE — Telephone Encounter (Signed)
Please Advise, Dr. Quay Burow out of office  Patient is requesting medication to treat her bladder spasms. She states that she can not take amitriptyline, which has taken before ,due to severe side affects.

## 2021-03-16 NOTE — Addendum Note (Signed)
Addended by: Biagio Borg on: 03/16/2021 03:24 PM   Modules accepted: Orders

## 2021-03-16 NOTE — Telephone Encounter (Signed)
° ° °  Please return call to patient °

## 2021-03-16 NOTE — Telephone Encounter (Signed)
Called and spoke with pt, she verb understanding.

## 2021-03-20 ENCOUNTER — Telehealth: Payer: Self-pay | Admitting: Internal Medicine

## 2021-03-20 DIAGNOSIS — N6320 Unspecified lump in the left breast, unspecified quadrant: Secondary | ICD-10-CM

## 2021-03-20 NOTE — Telephone Encounter (Signed)
   Patient requesting order for diagnostic mammogram/ultrasound for painful lump in breast  Patient states she already had mammogram Bernie

## 2021-03-21 NOTE — Telephone Encounter (Signed)
Stat diagnostic mammogram and breast ultrasound ordered for breast center

## 2021-03-21 NOTE — Telephone Encounter (Signed)
Yes I will order since we can not get her in - can you just clarify which breast the lump is in and about where it is located.  Thank you

## 2021-03-22 ENCOUNTER — Telehealth: Payer: Self-pay | Admitting: Pharmacist

## 2021-03-22 NOTE — Progress Notes (Addendum)
Chronic Care Management Pharmacy Assistant   Name: Mary Acosta  MRN: [id number] DOB: 05-04-1956   Reason for Encounter: Medication [facility] visits:  None in previous 6 months  Medications: Outpatient Encounter Medications as of 03/22/2021  Medication Sig Note   acetaminophen (TYLENOL) 500 MG tablet Take 1,000 mg by mouth 2 (two) times daily.    albuterol (VENTOLIN HFA) 108 (90 Base) MCG/ACT inhaler TAKE 2 PUFFS BY MOUTH EVERY 6 HOURS AS NEEDED FOR WHEEZE OR SHORTNESS OF BREATH    amLODipine (NORVASC) 5 MG tablet TAKE ONE TABLET BY MOUTH EVERY MORNING and TAKE ONE TABLET BY MOUTH EVERYDAY AT BEDTIME    aspirin EC 81 MG tablet Take 1 tablet (81 mg total) by mouth daily.    diclofenac (VOLTAREN) 50 MG EC tablet Take 50 mg by mouth 2 (two) times daily.    furosemide (LASIX) 40 MG tablet TAKE ONE TABLET BY MOUTH EVERY MORNING    gabapentin (NEURONTIN) 300 MG capsule Take 300 mg by mouth 3 (three) times daily.  08/29/2020: As needed   hydrALAZINE (APRESOLINE) 100 MG tablet TAKE ONE TABLET BY MOUTH EVERY MORNING and TAKE ONE TABLET BY MOUTH EVERYDAY AT BEDTIME    hydrOXYzine (ATARAX/VISTARIL) 25 MG tablet Take 1 tablet (25 mg total) by mouth in the morning and at bedtime.    Insulin Pen Needle (PEN NEEDLES) 31G X 8 MM MISC 1 Device by Does not apply route daily.    Insulin Syringe-Needle U-100 26G X 1/2" 1 ML MISC Use daily for insulin injection as directed    ipratropium-albuterol (DUONEB) 0.5-2.5 (3) MG/3ML SOLN Take 3 mLs by nebulization every 6 (six) hours as needed (Wheezing or dyspnea.).    levothyroxine (SYNTHROID) 200 MCG tablet TAKE ONE TABLET BY MOUTH monday-saturday before breakfast and TAKE 1/2 TABLET BY MOUTH ONCE WEEKLY BEFORE BREAKFAST ON SUNDAYS    metFORMIN (GLUCOPHAGE) 500 MG tablet Take 1 tablet (500 mg total) by mouth 2 (two) times daily with a meal.    metoprolol tartrate (LOPRESSOR) 25 MG tablet TAKE ONE TABLET BY MOUTH EVERY MORNING and TAKE ONE  TABLET BY MOUTH EVERYDAY AT BEDTIME    mirabegron ER (MYRBETRIQ) 25 MG TB24 tablet Take 1 tablet (25 mg total) by mouth daily.    morphine (MS CONTIN) 15 MG 12 hr tablet Take 15 mg by mouth daily.    morphine (MS CONTIN) 30 MG 12 hr tablet Take 30 mg by mouth 2 (two) times daily.    naloxone (NARCAN) nasal spray 4 mg/0.1 mL One spray in either nostril once for known/suspected opioid overdose. May repeat every 2-3 minutes in alternating nostril til EMS arrives    ondansetron (ZOFRAN-ODT) 4 MG disintegrating tablet Take 1 tablet (4 mg total) by mouth every 6 (six) hours as needed for nausea or vomiting.    PARoxetine (PAXIL) 40 MG tablet Take 1 tablet (40 mg total) by mouth daily.    potassium chloride SA (KLOR-CON M20) 20 MEQ tablet TAKE 2 TABLETS EVERY DAY AS NEEDED FOR CRAMPING 08/29/2020: 08/29/20 very rarely   Pseudoephedrine-Guaifenesin [id number] MG TB12 Take by mouth.    Semaglutide,0.25 or 0.'5MG'$ /DOS, (OZEMPIC, 0.25 OR 0.5 MG/DOSE,) 2 MG/1.5ML SOPN Inject 0.5 mg into the skin once a week. Begin once received through patient assistance. Continue Rybelsus 14 mg until then.    simvastatin (ZOCOR) 20 MG tablet TAKE ONE TABLET BY MOUTH EVERYDAY AT BEDTIME    tiZANidine (ZANAFLEX) 2 MG tablet Take 2 mg by mouth  as needed for muscle spasms.     traZODone (DESYREL) 150 MG tablet Take by mouth at bedtime.    No facility-administered encounter medications on file as of 03/22/2021.    Pharmacist Review  Reviewed chart for medication changes ahead of medication coordination call.  No OVs, Consults, or hospital visits since last care coordination call/Pharmacist visit. (If appropriate, list visit date, provider name)  No medication changes indicated OR if recent visit, treatment plan here.  BP Readings from Last 3 Encounters:  01/15/21 128/78  11/01/20 140/88  10/13/20 128/82    Lab Results  Component Value Date   HGBA1C 6.9 (A) 01/15/2021     Patient obtains medications through Adherence  Packaging  30 Days   Last adherence delivery included:  Metoprolol Tartrate 25 mg 1 tab breakfast and 1 tab bedtime Metformin 500 mg BID - 1 tab breakfast and  1 tab bedtime Simvastatin 20 mg 1 tab bedtime Amlodipine 5 mg 1 tab breakfast and 1 tab bedtime Hydralazine 100 mg 1 tab breakfast and 1 tab bedti Levothyroxine 200 mcg 1-tab daily breakfast Diclofenac sodium 50 mg 1 tab twice daily  Paroxetine 40 mg 1 tab bedtime Furosemide 40 mg 1 tab daily - breakfast Tizanidine 2 mg 1 tab every 8 hrs. as needed (vial) Morphine 15 mg ER - (vial)  Patient is due for next adherence delivery on: 03/28/21.  Called patient and reviewed medications and coordinated delivery. This delivery to include: Metoprolol Tartrate 25 mg 1 tab breakfast and 1 tab bedtime Metformin 500 mg BID - 1 tab breakfast and  1 tab bedtime Simvastatin 20 mg 1 tab bedtime Amlodipine 5 mg 1 tab breakfast and 1 tab bedtime Hydralazine 100 mg 1 tab breakfast and 1 tab bedtime Levothyroxine 200 mcg 1-tab daily breakfast Paroxetine 40 mg 1 tab bedtime Furosemide 40 mg 1 tab daily - breakfast Tizanidine 2 mg 1 tab every 8 hrs. as needed (vial) Morphine 15 mg ER - (vial) Trazodone 150 1 tab at bedtime Hydroxyzine 50 mg 1 tab twice daily  Patient declined the following medications Diclofenac sodium 50 mg 1 tab twice daily, patient stated that she got them in her packs the last delivery so she has extra in packs and vial  Patient needs refills for none noted.  Confirmed delivery date of 03/28/21, advised patient that pharmacy will contact them the morning of delivery.    Star Rating Drugs: Simvastatin 20 mg 02/23/21 30 ds Metformin 500 mg 02/23/21 30 ds  Ethelene Hal Clinical Pharmacist Assistant [phone]   Time spent:40

## 2021-03-23 DIAGNOSIS — E119 Type 2 diabetes mellitus without complications: Secondary | ICD-10-CM | POA: Diagnosis not present

## 2021-03-24 ENCOUNTER — Ambulatory Visit
Admission: RE | Admit: 2021-03-24 | Discharge: 2021-03-24 | Disposition: A | Discharge status: Home / Self Care | Payer: Medicare HMO | Source: Ambulatory Visit | Attending: Internal Medicine | Admitting: Internal Medicine

## 2021-03-24 DIAGNOSIS — N644 Mastodynia: Secondary | ICD-10-CM | POA: Diagnosis not present

## 2021-03-24 DIAGNOSIS — R922 Inconclusive mammogram: Secondary | ICD-10-CM | POA: Diagnosis not present

## 2021-03-24 DIAGNOSIS — N6320 Unspecified lump in the left breast, unspecified quadrant: Secondary | ICD-10-CM

## 2021-03-27 ENCOUNTER — Other Ambulatory Visit: Payer: Self-pay

## 2021-03-27 MED ORDER — SIMVASTATIN 20 MG PO TABS
20.0000 mg | ORAL_TABLET | Freq: Every day | ORAL | 6 refills | Status: DC
Start: 2021-03-27 — End: 2021-11-13

## 2021-04-03 ENCOUNTER — Other Ambulatory Visit: Payer: Self-pay

## 2021-04-03 ENCOUNTER — Encounter: Payer: Medicare HMO | Admitting: Psychology

## 2021-04-05 ENCOUNTER — Other Ambulatory Visit: Payer: Self-pay

## 2021-04-05 ENCOUNTER — Ambulatory Visit (INDEPENDENT_AMBULATORY_CARE_PROVIDER_SITE_OTHER): Payer: Medicare HMO | Admitting: Pharmacist

## 2021-04-05 DIAGNOSIS — E039 Hypothyroidism, unspecified: Secondary | ICD-10-CM | POA: Diagnosis not present

## 2021-04-05 DIAGNOSIS — E1165 Type 2 diabetes mellitus with hyperglycemia: Secondary | ICD-10-CM | POA: Diagnosis not present

## 2021-04-05 DIAGNOSIS — E1159 Type 2 diabetes mellitus with other circulatory complications: Secondary | ICD-10-CM

## 2021-04-05 DIAGNOSIS — I10 Essential (primary) hypertension: Secondary | ICD-10-CM | POA: Diagnosis not present

## 2021-04-05 DIAGNOSIS — Z794 Long term (current) use of insulin: Secondary | ICD-10-CM

## 2021-04-05 DIAGNOSIS — E782 Mixed hyperlipidemia: Secondary | ICD-10-CM

## 2021-04-05 DIAGNOSIS — IMO0002 Reserved for concepts with insufficient information to code with codable children: Secondary | ICD-10-CM

## 2021-04-05 DIAGNOSIS — I5032 Chronic diastolic (congestive) heart failure: Secondary | ICD-10-CM | POA: Diagnosis not present

## 2021-04-05 NOTE — Progress Notes (Signed)
Chronic Care Management Pharmacy Note  04/06/2021 Name:  Mary Acosta MRN:  [id number] DOB:  28-Nov-1955  Summary: -Pt reports still using Basaglar occasionally - last use 2 days ago when BG was ~260  Recommendations/Changes made from today's visit: -Consider increasing Ozempic to 1 mg - would need new rx sent to [facility] (consulting/ w/ endocrine)   Subjective: Mary Acosta is an 65 y.o. year old female who is a primary patient of Burns, Claudina Lick, MD.  The CCM team was consulted for assistance with disease management and care coordination needs.    Engaged with patient by telephone for follow up visit in response to provider referral for pharmacy case management and/or care coordination services.   Consent to Services:  The patient was given information about Chronic Care Management services, agreed to services, and gave verbal consent prior to initiation of services.  Please see initial visit note for detailed documentation.   Patient Care Team: Binnie Rail, MD as PCP - General (Internal Medicine) Stanford Breed Denice Bors, MD as PCP - Cardiology (Cardiology) Charlton Haws, [facility] as Pharmacist (Pharmacist)  Recent office visits: 03/15/21 sent in [place] for bladder spasms 03/06/21 Dr Sharlet Salina VV: c/o hallucinations, w/ hyperglycemia (600), diarrhea; advised to go to ER  11/01/20 Dr Quay Burow OV: chronic f/u. Her son died unexpectedly recently. Vit D slightly low, increase Vitamin D by 1000 IU.   09/01/20 Dr Quay Burow VV: URI, rx'd Z-pak.  Recent consult visits: 01/26/21 Dr Aram Candela [facility] pain mgmt): increase diclofenac to TID prn  01/15/21 Dr Cruzita Lederer (endocrine): f/u DM; pt stopped insulin 1 month prior, continues on metformin and Ozempic. Advised to d/c insulin completely.  01/11/21 Dr Sima Matas (psych): f/u memory issues, postconcussion syndrome; referred for formal neuropsychological testing  12/08/20 Dr Michail Sermon [facility] spine): SI joint injection.  10/13/20 Dr Cruzita Lederer  (endocrine): A1c down to 7.6. change Rybelsus to Ozempic 0.5 mg. Reduce Levothyroxine to 200 mcg 6/7 days and 100 mcg 1/7 days  08/29/20 NP Ward Givens (neurology): f/u neuropathy, fibromyalgia, memory issues. She stopped donepezil previously due to cost. Memory score has declined since 2019. Ordered neuropsych testing before prescribing medication.  08/28/20 Dr Aram Candela [facility] pain mgmt): refilled morphine, continue meloxicam, gabapentin, tizanidine. Restart Amitiza BID. Decrease amitriptyline to 75 mg HS.  08/09/20 Leonia Reader (Nutrition): rec'd multivitamin. Add protein to breakfast. Stop regular Kool-aid, get unsweetened. Try flavored waters.  08/04/20 Dr Cruzita Lederer (endocrine): switched Rybelsus to [place] temporarily until PAP approved. Reduced levothyroxine to 200 mcg x 6 days with 100 mcg (1/2 tab) on 7th day.  07/05/20 Dr Cruzita Lederer (endocrine): started metformin 500 mg BID.  Hospital visits: None in previous 6 months  Objective:  Lab Results  Component Value Date   CREATININE 0.98 11/01/2020   BUN 13 11/01/2020   GFR 61.05 11/01/2020   GFRNONAA 62 05/03/2020   GFRAA 72 05/03/2020   NA 138 11/01/2020   K 4.7 11/01/2020   CALCIUM 9.5 11/01/2020   CO2 34 (H) 11/01/2020    Lab Results  Component Value Date/Time   HGBA1C 6.9 (A) 01/15/2021 05:12 PM   HGBA1C 7.6 (A) 10/13/2020 02:49 PM   HGBA1C 10.4 (H) 05/03/2020 03:17 PM   HGBA1C 11.0 (H) 10/22/2019 02:11 PM   GFR 61.05 11/01/2020 02:27 PM   GFR 57.25 (L) 10/22/2019 02:11 PM   MICROALBUR <0.7 09/19/2017 02:26 PM   MICROALBUR 0.8 02/05/2017 11:09 AM    Last diabetic Eye exam:  Lab Results  Component Value Date/Time  HMDIABEYEEXA No Retinopathy 05/29/2016 12:00 AM    Last diabetic Foot exam: No results found for: HMDIABFOOTEX   Lab Results  Component Value Date   CHOL 108 11/01/2020   HDL 30.10 (L) 11/01/2020   LDLCALC 49 11/01/2020   TRIG 141.0 11/01/2020   CHOLHDL 4 11/01/2020    Hepatic Function Latest Ref Rng  & Units 11/01/2020 05/03/2020 02/17/2020  Total Protein 6.0 - 8.3 g/dL 7.4 7.1 6.6  Albumin 3.5 - 5.2 g/dL 3.8 - 3.9  AST 0 - 37 U/L 34 22 25  ALT 0 - 35 U/L '17 16 12  ' Alk Phosphatase 39 - 117 U/L 79 - 115  Total Bilirubin 0.2 - 1.2 mg/dL 0.9 0.5 0.3  Bilirubin, Direct 0.00 - 0.40 mg/dL - - 0.13    Lab Results  Component Value Date/Time   TSH 0.51 02/02/2021 01:35 PM   TSH 0.14 (L) 08/04/2020 11:57 AM   FREET4 1.43 02/02/2021 01:35 PM   FREET4 1.59 08/04/2020 11:57 AM    CBC Latest Ref Rng & Units 11/01/2020 05/03/2020 10/22/2019  WBC 4.0 - 10.5 K/uL 7.1 8.9 7.8  Hemoglobin 12.0 - 15.0 g/dL 13.0 12.2 13.7  Hematocrit 36.0 - 46.0 % 39.7 39.9 43.1  Platelets 150.0 - 400.0 K/uL 132.0(L) 174 144.0(L)    Lab Results  Component Value Date/Time   VD25OH 26.05 (L) 11/01/2020 02:27 PM    Clinical ASCVD: Yes  The ASCVD Risk score Mikey Bussing DC Jr., et al., 2013) failed to calculate for the following reasons:   The patient has a prior MI or stroke diagnosis    Depression screen [facility] 2/9 01/28/2020 09/04/2018 06/13/2017  Decreased Interest 1 0 0  Down, Depressed, Hopeless 0 0 0  PHQ - 2 Score 1 0 0  Some recent data might be hidden     Social History   Tobacco Use  Smoking Status Former   Packs/day: 2.50   Years: 39.00   Pack years: 97.50   Types: Cigarettes   Quit date: 10/22/2010   Years since quitting: 10.4  Smokeless Tobacco Never   BP Readings from Last 3 Encounters:  01/15/21 128/78  11/01/20 140/88  10/13/20 128/82   Pulse Readings from Last 3 Encounters:  01/15/21 70  11/01/20 82  10/13/20 75   Wt Readings from Last 3 Encounters:  01/15/21 260 lb 9.6 oz (118.2 kg)  11/01/20 268 lb (121.6 kg)  10/13/20 275 lb 3.2 oz (124.8 kg)   BMI Readings from Last 3 Encounters:  01/15/21 38.48 kg/m  11/01/20 39.58 kg/m  10/13/20 40.64 kg/m   Assessment/Interventions: Review of patient past medical history, allergies, medications, health status, including review of consultants  reports, laboratory and other test data, was performed as part of comprehensive evaluation and provision of chronic care management services.   SDOH:  (Social Determinants of Health) assessments and interventions performed: Yes  SDOH Screenings   Alcohol Screen: Not on file  Depression (PHQ2-9): Not on file  Financial Resource Strain: Medium Risk   Difficulty of Paying Living Expenses: Somewhat hard  Food Insecurity: Not on file  Housing: Not on file  Physical Activity: Not on file  Social Connections: Not on file  Stress: Not on file  Tobacco Use: Medium Risk   Smoking Tobacco Use: Former   Smokeless Tobacco Use: Never  Transportation Needs: Not on file    [place]  Allergies  Allergen Reactions   Gabapentin Swelling    Swelling in legs Swelling in legs Swelling in legs  Losartan Other (See Comments)    Myalgias and muscle cramping Other reaction(s): Other (See Comments) Myalgias and muscle cramping Myalgias and muscle cramping Other reaction(s): Other (See Comments) Myalgias and muscle cramping   Aricept [Donepezil Hcl]     Nausea/vomiting, low BP   Oxycodone Itching   Sulfa Antibiotics Nausea Only and Rash   Sulfonamide Derivatives Nausea Only    Medications Reviewed Today     Reviewed by Charlton Haws, [facility] (Pharmacist) on 04/06/21 at 1548  Med List Status: <None>   Medication Order Taking? Sig Documenting Provider Last Dose Status Informant  acetaminophen (TYLENOL) 500 MG tablet [id number] Yes Take 1,000 mg by mouth 2 (two) times daily. [provider] Taking Active Self  albuterol (VENTOLIN HFA) 108 (90 Base) MCG/ACT inhaler [id number] Yes TAKE 2 PUFFS BY MOUTH EVERY 6 HOURS AS NEEDED FOR WHEEZE OR SHORTNESS OF BREATH Burns, Claudina Lick, MD Taking Active Self  amLODipine (NORVASC) 5 MG tablet [id number] Yes TAKE ONE TABLET BY MOUTH EVERY MORNING and TAKE ONE TABLET BY MOUTH EVERYDAY AT BEDTIME Binnie Rail, MD Taking Active   aspirin EC 81 MG  tablet [id number] Yes Take 1 tablet (81 mg total) by mouth daily. Lelon Perla, MD Taking Active Self  diclofenac (VOLTAREN) 50 MG EC tablet [id number] Yes Take 50 mg by mouth 3 (three) times daily. [provider] Taking Active   furosemide (LASIX) 40 MG tablet [id number] Yes TAKE ONE TABLET BY MOUTH EVERY MORNING Burns, Claudina Lick, MD Taking Active   gabapentin (NEURONTIN) 300 MG capsule [id number] Yes Take 300 mg by mouth 3 (three) times daily.  [provider] Taking Active Self           Med Note Jacobo Forest, Leilani Able   Tue Aug 29, 2020  7:46 AM) As needed  hydrALAZINE (APRESOLINE) 100 MG tablet [id number] Yes TAKE ONE TABLET BY MOUTH EVERY MORNING and TAKE ONE TABLET BY MOUTH EVERYDAY AT BEDTIME Binnie Rail, MD Taking Active   hydrOXYzine (ATARAX/VISTARIL) 25 MG tablet [id number] Yes Take 1 tablet (25 mg total) by mouth in the morning and at bedtime. Binnie Rail, MD Taking Active   Insulin Pen Needle (PEN NEEDLES) 31G X 8 MM MISC [id number] Yes 1 Device by Does not apply route daily. Binnie Rail, MD Taking Active   Insulin Syringe-Needle U-100 26G X 1/2" 1 ML MISC [id number] Yes Use daily for insulin injection as directed Burns, Claudina Lick, MD Taking Active   ipratropium-albuterol (DUONEB) 0.5-2.5 (3) MG/3ML SOLN [id number] Yes Take 3 mLs by nebulization every 6 (six) hours as needed (Wheezing or dyspnea.). Brand Males, MD Taking Active   levothyroxine (SYNTHROID) 200 MCG tablet [id number] Yes TAKE ONE TABLET BY MOUTH monday-saturday before breakfast and TAKE 1/2 TABLET BY MOUTH ONCE WEEKLY BEFORE BREAKFAST ON SUNDAYS Burns, Claudina Lick, MD Taking Active   metFORMIN (GLUCOPHAGE) 500 MG tablet [id number] Yes Take 1 tablet (500 mg total) by mouth 2 (two) times daily with a meal. Philemon Kingdom, MD Taking Active   metoprolol tartrate (LOPRESSOR) 25 MG tablet [id number] Yes TAKE ONE TABLET BY MOUTH EVERY MORNING and TAKE ONE TABLET BY MOUTH EVERYDAY AT BEDTIME Lelon Perla, MD Taking  Active   mirabegron ER (MYRBETRIQ) 25 MG TB24 tablet [id number] Yes Take 1 tablet (25 mg total) by mouth daily. Biagio Borg, MD Taking Active   morphine (MS CONTIN) 15 MG 12 hr tablet [id number] Yes Take 15 mg by mouth daily. [provider] Taking Active  morphine (MS CONTIN) 30 MG 12 hr tablet [id number] Yes Take 30 mg by mouth 2 (two) times daily. [provider] Taking Active Self           Med Note Rosemarie Beath, MELISSA B   Sun Dec 03, 2015  3:39 PM)    naloxone [facility]) nasal spray 4 mg/0.1 mL [id number] Yes One spray in either nostril once for known/suspected opioid overdose. May repeat every 2-3 minutes in alternating nostril til EMS arrives [provider] Taking Active   ondansetron (ZOFRAN-ODT) 4 MG disintegrating tablet [id number] Yes Take 1 tablet (4 mg total) by mouth every 6 (six) hours as needed for nausea or vomiting. Doran Stabler, MD Taking Active   PARoxetine (PAXIL) 40 MG tablet [id number] Yes Take 1 tablet (40 mg total) by mouth daily. Binnie Rail, MD Taking Active   potassium chloride SA (KLOR-CON M20) 20 MEQ tablet [id number] Yes TAKE 2 TABLETS EVERY DAY AS NEEDED FOR CRAMPING Binnie Rail, MD Taking Active            Med Note Linus Orn Aug 29, 2020  7:47 AM) 08/29/20 very rarely  Pseudoephedrine-Guaifenesin 236 169 2500 MG TB12 [id number] Yes Take by mouth. [provider] Taking Active   Semaglutide,0.25 or 0.5MG/DOS, (OZEMPIC, 0.25 OR 0.5 MG/DOSE,) 2 MG/1.5ML SOPN [id number] Yes Inject 0.5 mg into the skin once a week. Begin once received through patient assistance. Continue Rybelsus 14 mg until then. [provider] Taking Active   simvastatin (ZOCOR) 20 MG tablet [id number] Yes Take 1 tablet (20 mg total) by mouth daily. Lelon Perla, MD Taking Active   tiZANidine (ZANAFLEX) 2 MG tablet [id number] Yes Take 2 mg by mouth as needed for muscle spasms.  [provider] Taking Active Self           Med Note Spero Curb,  GREG A   Mon Jul 10, 2015  4:55 PM)    traZODone (DESYREL) 150 MG tablet [id number] Yes Take by mouth at bedtime. [provider] Taking Active             Patient Active Problem List   Diagnosis Date Noted   Hallucinations 03/06/2021   Aortic atherosclerosis ([place]) 11/01/2020   URI (upper respiratory infection) 09/01/2020   Uncontrolled type 2 diabetes mellitus with circulatory disorder, with long-term current use of insulin ([place]) 07/05/2020   PSVT (paroxysmal supraventricular tachycardia) ([place]) 05/31/2020   UTI (urinary tract infection) 03/01/2020   Grief 02/03/2019   Nausea 01/04/2019   Reactive airway disease 10/21/2018   Arthralgia 09/04/2018   Thyroid cancer ([place]) 04/30/2018   Obesity hypoventilation syndrome ([place]) 09/24/2017   Palpitations 09/18/2017   Diabetic neuropathy ([place]) 06/04/2017   Vitamin B12 deficiency 03/05/2017   Numbness and tingling in both hands 02/24/2017   OSA (obstructive sleep apnea) 01/31/2017   Liver cirrhosis secondary to NASH ([place]) 01/31/2017   Hypothyroidism 01/31/2017   Chronic narcotic use 01/31/2017   Fibromyalgia 01/31/2017   Hyperlipidemia 01/31/2017   Gastroparesis    Memory disorder 07/25/2016   Lump in neck 01/29/2016   Chronic diastolic (congestive) heart failure ([place]) 01/03/2016   Fatty liver 01/03/2016   Type 1 diabetes mellitus ([place]) 01/03/2016   Recurrent Clostridium difficile diarrhea 01/03/2016   Chronic respiratory failure ([place]) 11/10/2014   Physical deconditioning 09/26/2014   Dyspnea 09/26/2014   Hyperglycemia 07/09/2014   Iron deficiency anemia, unspecified  04/05/2011   Bariatric surgery status 04/05/2011   Left arm pain    Vitamin  D deficiency 10/22/2007   LOW BACK PAIN, CHRONIC 10/22/2007   INSOMNIA 10/22/2007   Obesity 10/14/2007   Chronic pain syndrome 10/14/2007   CARPAL TUNNEL SYNDROME 10/14/2007   ALLERGIC RHINITIS CAUSE UNSPECIFIED 10/14/2007   ARTHRITIS 10/14/2007   Anxiety state 08/25/2007    Depression 08/25/2007   Essential hypertension 08/25/2007   ASTHMA 08/25/2007   CONSTIPATION 08/25/2007   POLYMYALGIA RHEUMATICA 08/25/2007   LEG EDEMA, BILATERAL 08/25/2007    Immunization History  Administered Date(s) Administered   Hep A / Hep B 02/05/2017   Hepatitis B, adult 03/05/2017, 08/14/2017   Influenza,inj,Quad PF,6+ Mos 05/27/2015, 05/30/2016, 06/13/2017, 05/05/2018, 05/22/2019, 05/03/2020   Influenza,inj,quad, With Preservative 04/26/2018, 05/24/2019   Influenza-Unspecified 05/26/2014   Moderna Sars-Covid-2 Vaccination 10/05/2019, 11/02/2019   Pneumococcal Conjugate-13 10/23/2015   Pneumococcal Polysaccharide-23 07/10/2014    Conditions to be addressed/monitored:  Hypertension, Hyperlipidemia, Diabetes, Hypothyroidism, Depression, Anxiety and Pain management  Care Plan : [place]  Updates made by Charlton Haws, [place] since 04/06/2021 12:00 AM     Problem: Hypertension, Hyperlipidemia, Diabetes, Hypothyroidism, Depression, Anxiety and Pain management   Priority: High     Long-Range Goal: Disease management   Start Date: 10/05/2020  Expected End Date: 04/04/2021  This Visit's Progress: On track  Recent Progress: On track  Priority: High  Note:   Current Barriers:  Unable to independently afford treatment regimen Unable to independently monitor therapeutic efficacy Unable to maintain control of diabetes  Pharmacist Clinical Goal(s):  Patient will verbalize ability to afford treatment regimen achieve adherence to monitoring guidelines and medication adherence to achieve therapeutic efficacy maintain control of diabetes as evidenced by improvement in blood sugar  through collaboration with PharmD and provider.   Interventions: 1:1 collaboration with Binnie Rail, MD regarding development and update of comprehensive plan of care as evidenced by provider attestation and co-signature Inter-disciplinary care team collaboration (see longitudinal  plan of care) Comprehensive medication review performed; medication list updated in electronic medical record  Hypertension / Diastolic heart failure (BP goal < 130/80) Controlled - per pt report HF type: diastolic; LVEF 16-10% (96/04/54) Current regimen:  amlodipine 5 mg twice a day spironolactone 25 mg daily as needed for high BP Hydralazine 100 mg twice a day metoprolol tartrate 25 mg twice a day Furosemide 20 mg as needed for swelling Interventions: Discussed benefits of medications and importance of adherence  Recommend to continue current medication   Hyperlipidemia (LDL goal < 70) Controlled - most recent LDL was below goal; pt denies muscle aches with simvastatin alone; Hx aortic atherosclerosis merits LDL goal < 70 Current regimen:  Simvastatin 20 mg daily  Aspirin 81 mg daily Interventions: Discussed cholesterol goals and benefits of medications for prevention of heart attack / stroke Previously stopped ezetimibe due to patient complaints of muscle aches with combination simvastatin + ezetimibe Recommend to continue current medication   Diabetes (A1c goal < 7%) Not ideally controlled - A1c recently at goal and pt was advised to stop insulin entirely; she has still been using Basaglar occasionally for high sugars; earlier this week she took 60 untis of Basagalr when BG was ~260 Home BG:  Current regimen:  Metformin 500 mg BID Ozempic 0.5 mg weekly Interventions: Counseled on benefits of Ozempic, possible to increase dose and eliminate insulin entirely Recommend to increase Ozempic to 1 mg weekly - would need new rx sent to [facility], will collaborate with Dr Cruzita Lederer  Depression/Anxiety/Insomnia Controlled - pt has stopped amitriptyline and is now taking higher  dose of trazodone; she rarely uses hydroxyzine; she reports sleeping well enough considering recent loss of her son unexpectedly Current regimen:  Paroxetine 40 mg daily Trazodone 150 mg HS Hydroxyzine 25 mg  BID prn (rare use) Interventions: Recommend to continue current medication  Hypothyroidism (goal: maintain TSH in goal range) Controlled - most recent TSH at goal on new dose of levothyroxine Current regimen:  Levothyroxine 200 mcg daily except 1/2 tab on Sundays Interventions: Discussed recent low TSH indicating hyperthyroid state; the plan was to recheck thyroid levels 6 weeks after appt in Feb. Recommend to continue current medication  Patient Goals/Self-Care Activities Patient will:  - take medications as prescribed focus on medication adherence by pill packs check glucose twice daily, document, and provide at future appointments check blood pressure daily, document, and provide at future appointments collaborate with provider on medication access solutions -Keep appt with Dr Cruzita Lederer 04/17/21       Medication Assistance: Ozempic [employer]) - enrollment ends 07/25/21 Basaglar [employer]) - enrollment ends 08/25/21  Compliance/Adherence/Medication fill history: Care Gaps: Shingrix Prevnar Covid booster (due 11/30/19) Colonoscopy (due 11/25/17) Pap smear (due 05/27/19) Eye exam (due 02/08/20) Foot exam (due 10/09/19)  Star-Rating Drugs: Simvastatin - LF 03/28/21 x 30 ds Metformin - LF 03/26/21 x 30 ds  Patient's preferred pharmacy is:  [facility] - [place], [place] - [address] [place] [address] Phone: [phone] Fax: [phone]  Uses pill box? Yes - pill packs Pt endorses 100% compliance  We discussed: Reviewed patient's [facility] medication and Epic medication profile assuring there are no discrepancies or gaps in therapy. Confirmed all fill dates appropriate and verified with patient that there is a sufficient quantity of all prescribed medications at home. Informed patient to call me any time if needing medications before scheduled deliveries.   Patient decided to: Utilize [facility] pharmacy for  medication synchronization, packaging and delivery  Care Plan and Follow Up Patient Decision:  Patient agrees to Care Plan and Follow-up.  Plan: Telephone follow up appointment with care management team member scheduled for:  3 months  Charlene Brooke, PharmD, [place], CPP Clinical Pharmacist [facility] at [facility] [phone]

## 2021-04-06 NOTE — Patient Instructions (Signed)
Visit Information  Phone number for Pharmacist: [phone]   Goals Addressed             This Visit's Progress    Manage My Medicine       Timeframe:  Long-Range Goal Priority:  High Start Date:      10/05/20                       Expected End Date:    10/05/21               Follow Up Date Nov 2022   - call for medicine refill 2 or 3 days before it runs out - call if I am sick and can't take my medicine - keep a list of all the medicines I take; vitamins and herbals too - use [facility] for pill packaging - contact [name] with any medication issues   Why is this important?   These steps will help you keep on track with your medicines.         Care Plan : [place]  Updates made by Charlton Haws, RPH since 04/06/2021 12:00 AM     Problem: Hypertension, Hyperlipidemia, Diabetes, Hypothyroidism, Depression, Anxiety and Pain management   Priority: High     Long-Range Goal: Disease management   Start Date: 10/05/2020  Expected End Date: 04/04/2021  This Visit's Progress: On track  Recent Progress: On track  Priority: High  Note:   Current Barriers:  Unable to independently afford treatment regimen Unable to independently monitor therapeutic efficacy Unable to maintain control of diabetes  Pharmacist Clinical Goal(s):  Patient will verbalize ability to afford treatment regimen achieve adherence to monitoring guidelines and medication adherence to achieve therapeutic efficacy maintain control of diabetes as evidenced by improvement in blood sugar  through collaboration with PharmD and provider.   Interventions: 1:1 collaboration with Binnie Rail, MD regarding development and update of comprehensive plan of care as evidenced by provider attestation and co-signature Inter-disciplinary care team collaboration (see longitudinal plan of care) Comprehensive medication review performed; medication list updated in electronic medical  record  Hypertension / Diastolic heart failure (BP goal < 130/80) Controlled - per pt report HF type: diastolic; LVEF [id number] ([id number]) Current regimen:  amlodipine 5 mg twice a day spironolactone 25 mg daily as needed for high BP Hydralazine 100 mg twice a day metoprolol tartrate 25 mg twice a day Furosemide 20 mg as needed for swelling Interventions: Discussed benefits of medications and importance of adherence  Recommend to continue current medication   Hyperlipidemia (LDL goal < 70) Controlled - most recent LDL was below goal; pt denies muscle aches with simvastatin alone; Hx aortic atherosclerosis merits LDL goal < 70 Current regimen:  Simvastatin 20 mg daily  Aspirin 81 mg daily Interventions: Discussed cholesterol goals and benefits of medications for prevention of heart attack / stroke Previously stopped ezetimibe due to patient complaints of muscle aches with combination simvastatin + ezetimibe Recommend to continue current medication   Diabetes (A1c goal < 7%) Not ideally controlled - A1c recently at goal and pt was advised to stop insulin entirely; she has still been using Basaglar occasionally for high sugars; earlier this week she took 60 untis of Basagalr when BG was ~260 Home BG:  Current regimen:  Metformin 500 mg BID Ozempic 0.5 mg weekly Interventions: Counseled on benefits of Ozempic, possible to increase dose and eliminate insulin entirely Recommend to increase Ozempic to 1 mg weekly -  would need new rx sent to [facility], will collaborate with Dr Cruzita Lederer  Depression/Anxiety/Insomnia Controlled - pt has stopped amitriptyline and is now taking higher dose of trazodone; she rarely uses hydroxyzine; she reports sleeping well enough considering recent loss of her son unexpectedly Current regimen:  Paroxetine 40 mg daily Trazodone 150 mg HS Hydroxyzine 25 mg BID prn (rare use) Interventions: Recommend to continue current medication  Hypothyroidism (goal:  maintain TSH in goal range) Controlled - most recent TSH at goal on new dose of levothyroxine Current regimen:  Levothyroxine 200 mcg daily except 1/2 tab on Sundays Interventions: Discussed recent low TSH indicating hyperthyroid state; the plan was to recheck thyroid levels 6 weeks after appt in Feb. Recommend to continue current medication  Patient Goals/Self-Care Activities Patient will:  - take medications as prescribed focus on medication adherence by pill packs check glucose twice daily, document, and provide at future appointments check blood pressure daily, document, and provide at future appointments collaborate with provider on medication access solutions -Keep appt with Dr Cruzita Lederer 04/17/21      Patient verbalizes understanding of instructions provided today and agrees to view in [place].  Telephone follow up appointment with pharmacy team member scheduled for: 3 months  Charlene Brooke, PharmD, [place], CPP Clinical Pharmacist [facility] [phone]

## 2021-04-11 DIAGNOSIS — J449 Chronic obstructive pulmonary disease, unspecified: Secondary | ICD-10-CM | POA: Diagnosis not present

## 2021-04-12 ENCOUNTER — Encounter: Payer: Medicare HMO | Admitting: Psychology

## 2021-04-12 DIAGNOSIS — F419 Anxiety disorder, unspecified: Secondary | ICD-10-CM | POA: Diagnosis not present

## 2021-04-12 DIAGNOSIS — R69 Illness, unspecified: Secondary | ICD-10-CM | POA: Diagnosis not present

## 2021-04-17 ENCOUNTER — Ambulatory Visit: Payer: Medicare HMO | Admitting: Internal Medicine

## 2021-04-17 ENCOUNTER — Telehealth: Payer: Self-pay | Admitting: Internal Medicine

## 2021-04-17 ENCOUNTER — Telehealth: Payer: Self-pay | Admitting: Pharmacist

## 2021-04-17 DIAGNOSIS — IMO0002 Reserved for concepts with insufficient information to code with codable children: Secondary | ICD-10-CM

## 2021-04-17 DIAGNOSIS — E1165 Type 2 diabetes mellitus with hyperglycemia: Secondary | ICD-10-CM

## 2021-04-17 MED ORDER — OZEMPIC (0.25 OR 0.5 MG/DOSE) 2 MG/1.5ML ~~LOC~~ SOPN: 0.5000 mg | PEN_INJECTOR | SUBCUTANEOUS | 0 refills | Status: DC

## 2021-04-17 NOTE — Telephone Encounter (Signed)
Rx sent to preferred pharmacy.

## 2021-04-17 NOTE — Progress Notes (Signed)
Chronic Care Management Pharmacy Assistant   Name: Mary Acosta  MRN: [id number] DOB: [birth year]  Reason for Encounter: Refill Form for Ozempic   Recent office visits:  None since last CCM contact  Recent consult visits:  None since last CCM contact  Hospital visits:  None in previous 6 months  Medications: Outpatient Encounter Medications as of 04/17/2021  Medication Sig Note   acetaminophen (TYLENOL) 500 MG tablet Take 1,000 mg by mouth 2 (two) times daily.    albuterol (VENTOLIN HFA) 108 (90 Base) MCG/ACT inhaler TAKE 2 PUFFS BY MOUTH EVERY 6 HOURS AS NEEDED FOR WHEEZE OR SHORTNESS OF BREATH    amLODipine (NORVASC) 5 MG tablet TAKE ONE TABLET BY MOUTH EVERY MORNING and TAKE ONE TABLET BY MOUTH EVERYDAY AT BEDTIME    aspirin EC 81 MG tablet Take 1 tablet (81 mg total) by mouth daily.    diclofenac (VOLTAREN) 50 MG EC tablet Take 50 mg by mouth 3 (three) times daily.    furosemide (LASIX) 40 MG tablet TAKE ONE TABLET BY MOUTH EVERY MORNING    gabapentin (NEURONTIN) 300 MG capsule Take 300 mg by mouth 3 (three) times daily.  08/29/2020: As needed   hydrALAZINE (APRESOLINE) 100 MG tablet TAKE ONE TABLET BY MOUTH EVERY MORNING and TAKE ONE TABLET BY MOUTH EVERYDAY AT BEDTIME    hydrOXYzine (ATARAX/VISTARIL) 25 MG tablet Take 1 tablet (25 mg total) by mouth in the morning and at bedtime.    Insulin Pen Needle (PEN NEEDLES) 31G X 8 MM MISC 1 Device by Does not apply route daily.    Insulin Syringe-Needle U-100 26G X 1/2" 1 ML MISC Use daily for insulin injection as directed    ipratropium-albuterol (DUONEB) 0.5-2.5 (3) MG/3ML SOLN Take 3 mLs by nebulization every 6 (six) hours as needed (Wheezing or dyspnea.).    levothyroxine (SYNTHROID) 200 MCG tablet TAKE ONE TABLET BY MOUTH monday-saturday before breakfast and TAKE 1/2 TABLET BY MOUTH ONCE WEEKLY BEFORE BREAKFAST ON SUNDAYS    metFORMIN (GLUCOPHAGE) 500 MG tablet Take 1 tablet (500 mg total) by mouth 2 (two) times daily  with a meal.    metoprolol tartrate (LOPRESSOR) 25 MG tablet TAKE ONE TABLET BY MOUTH EVERY MORNING and TAKE ONE TABLET BY MOUTH EVERYDAY AT BEDTIME    mirabegron ER (MYRBETRIQ) 25 MG TB24 tablet Take 1 tablet (25 mg total) by mouth daily.    morphine (MS CONTIN) 15 MG 12 hr tablet Take 15 mg by mouth daily.    morphine (MS CONTIN) 30 MG 12 hr tablet Take 30 mg by mouth 2 (two) times daily.    naloxone (NARCAN) nasal spray 4 mg/0.1 mL One spray in either nostril once for known/suspected opioid overdose. May repeat every 2-3 minutes in alternating nostril til EMS arrives    ondansetron (ZOFRAN-ODT) 4 MG disintegrating tablet Take 1 tablet (4 mg total) by mouth every 6 (six) hours as needed for nausea or vomiting.    PARoxetine (PAXIL) 40 MG tablet Take 1 tablet (40 mg total) by mouth daily.    potassium chloride SA (KLOR-CON M20) 20 MEQ tablet TAKE 2 TABLETS EVERY DAY AS NEEDED FOR CRAMPING 08/29/2020: 08/29/20 very rarely   Pseudoephedrine-Guaifenesin [id number] MG TB12 Take by mouth.    Semaglutide,0.25 or 0.'5MG'$ /DOS, (OZEMPIC, 0.25 OR 0.5 MG/DOSE,) 2 MG/1.5ML SOPN Inject 0.5 mg into the skin once a week. Begin once received through patient assistance. Continue Rybelsus 14 mg until then.    simvastatin (ZOCOR) 20 MG tablet Take  1 tablet (20 mg total) by mouth daily.    tiZANidine (ZANAFLEX) 2 MG tablet Take 2 mg by mouth as needed for muscle spasms.     traZODone (DESYREL) 150 MG tablet Take by mouth at bedtime.    No facility-administered encounter medications on file as of 04/17/2021.   Refill form completed for Ozempic. Form sent to Charlene Brooke for signature and to fax to [employer].    Charlene Brooke, CPP notified  Margaretmary Dys, [place] Pharmacy Assistant [phone]

## 2021-04-17 NOTE — Telephone Encounter (Signed)
Patient is asking for a refill on prescription Semaglutide,0.25 or 0.'5MG'$ /DOS, (OZEMPIC, 0.25 OR 0.5 MG/DOSE,) 2 MG/1.5ML SOPN [id number]  please advise

## 2021-04-19 ENCOUNTER — Telehealth: Payer: Self-pay | Admitting: Pharmacist

## 2021-04-19 ENCOUNTER — Other Ambulatory Visit: Payer: Self-pay | Admitting: Gastroenterology

## 2021-04-19 DIAGNOSIS — R110 Nausea: Secondary | ICD-10-CM

## 2021-04-19 NOTE — Progress Notes (Signed)
Chronic Care Management Pharmacy Assistant   Name: Mary Acosta  MRN: [id number] DOB: 02/21/1956   Reason for Encounter: Medication Review    Recent office visits:  None ID  Recent consult visits:  None ID  Hospital visits:  None in previous 6 months  Medications: Outpatient Encounter Medications as of 04/19/2021  Medication Sig Note   acetaminophen (TYLENOL) 500 MG tablet Take 1,000 mg by mouth 2 (two) times daily.    albuterol (VENTOLIN HFA) 108 (90 Base) MCG/ACT inhaler TAKE 2 PUFFS BY MOUTH EVERY 6 HOURS AS NEEDED FOR WHEEZE OR SHORTNESS OF BREATH    amLODipine (NORVASC) 5 MG tablet TAKE ONE TABLET BY MOUTH EVERY MORNING and TAKE ONE TABLET BY MOUTH EVERYDAY AT BEDTIME    aspirin EC 81 MG tablet Take 1 tablet (81 mg total) by mouth daily.    diclofenac (VOLTAREN) 50 MG EC tablet Take 50 mg by mouth 3 (three) times daily.    furosemide (LASIX) 40 MG tablet TAKE ONE TABLET BY MOUTH EVERY MORNING    gabapentin (NEURONTIN) 300 MG capsule Take 300 mg by mouth 3 (three) times daily.  08/29/2020: As needed   hydrALAZINE (APRESOLINE) 100 MG tablet TAKE ONE TABLET BY MOUTH EVERY MORNING and TAKE ONE TABLET BY MOUTH EVERYDAY AT BEDTIME    hydrOXYzine (ATARAX/VISTARIL) 25 MG tablet Take 1 tablet (25 mg total) by mouth in the morning and at bedtime.    Insulin Pen Needle (PEN NEEDLES) 31G X 8 MM MISC 1 Device by Does not apply route daily.    Insulin Syringe-Needle U-100 26G X 1/2" 1 ML MISC Use daily for insulin injection as directed    ipratropium-albuterol (DUONEB) 0.5-2.5 (3) MG/3ML SOLN Take 3 mLs by nebulization every 6 (six) hours as needed (Wheezing or dyspnea.).    levothyroxine (SYNTHROID) 200 MCG tablet TAKE ONE TABLET BY MOUTH monday-saturday before breakfast and TAKE 1/2 TABLET BY MOUTH ONCE WEEKLY BEFORE BREAKFAST ON SUNDAYS    metFORMIN (GLUCOPHAGE) 500 MG tablet Take 1 tablet (500 mg total) by mouth 2 (two) times daily with a meal.    metoprolol tartrate  (LOPRESSOR) 25 MG tablet TAKE ONE TABLET BY MOUTH EVERY MORNING and TAKE ONE TABLET BY MOUTH EVERYDAY AT BEDTIME    mirabegron ER (MYRBETRIQ) 25 MG TB24 tablet Take 1 tablet (25 mg total) by mouth daily.    morphine (MS CONTIN) 15 MG 12 hr tablet Take 15 mg by mouth daily.    morphine (MS CONTIN) 30 MG 12 hr tablet Take 30 mg by mouth 2 (two) times daily.    naloxone (NARCAN) nasal spray 4 mg/0.1 mL One spray in either nostril once for known/suspected opioid overdose. May repeat every 2-3 minutes in alternating nostril til EMS arrives    ondansetron (ZOFRAN-ODT) 4 MG disintegrating tablet Take 1 tablet (4 mg total) by mouth every 6 (six) hours as needed for nausea or vomiting.    PARoxetine (PAXIL) 40 MG tablet Take 1 tablet (40 mg total) by mouth daily.    potassium chloride SA (KLOR-CON M20) 20 MEQ tablet TAKE 2 TABLETS EVERY DAY AS NEEDED FOR CRAMPING 08/29/2020: 08/29/20 very rarely   Pseudoephedrine-Guaifenesin [phone] MG TB12 Take by mouth.    Semaglutide,0.25 or 0.'5MG'$ /DOS, (OZEMPIC, 0.25 OR 0.5 MG/DOSE,) 2 MG/1.5ML SOPN Inject 0.5 mg into the skin once a week. Begin once received through patient assistance. Continue Rybelsus 14 mg until then.    simvastatin (ZOCOR) 20 MG tablet Take 1 tablet (20 mg total) by  mouth daily.    tiZANidine (ZANAFLEX) 2 MG tablet Take 2 mg by mouth as needed for muscle spasms.     traZODone (DESYREL) 150 MG tablet Take by mouth at bedtime.    No facility-administered encounter medications on file as of 04/19/2021.   Pharmacist Review Reviewed chart for medication changes ahead of medication coordination call.  No OVs, Consults, or hospital visits since last care coordination call/Pharmacist visit.  No medication changes indicated  BP Readings from Last 3 Encounters:  01/15/21 128/78  11/01/20 140/88  10/13/20 128/82    Lab Results  Component Value Date   HGBA1C 6.9 (A) 01/15/2021     Patient obtains medications through Adherence Packaging  30 Days    Last adherence delivery included: Last filled 03/26/21 Metoprolol Tartrate 25 mg 1 tab breakfast and 1 tab bedtime Metformin 500 mg BID - 1 tab breakfast and  1 tab bedtime Simvastatin 20 mg 1 tab bedtime-last filled 03/28/21 Amlodipine 5 mg 1 tab breakfast and 1 tab bedtime Hydralazine 100 mg 1 tab breakfast and 1 tab bedtime Levothyroxine 200 mcg 1-tab daily breakfast Paroxetine 40 mg 1 tab bedtime Furosemide 40 mg 1 tab daily - breakfast Tizanidine 2 mg 1 tab every 8 hrs. as needed (vial) Morphine 15 mg ER - (vial) Trazodone 150 1 tab at bedtime Hydroxyzine 50 mg 1 tab twice daily    Patient is due for next adherence delivery on: 04/27/21.  Called patient and reviewed medications and coordinated delivery. This delivery to include: Metoprolol Tartrate 25 mg 1 tab breakfast and 1 tab bedtime Metformin 500 mg BID - 1 tab breakfast and  1 tab bedtime Simvastatin 20 mg 1 tab bedtime Amlodipine 5 mg 1 tab breakfast and 1 tab bedtime Hydralazine 100 mg 1 tab breakfast and 1 tab bedtime Levothyroxine 200 mcg 1-tab daily breakfast Paroxetine 40 mg 1 tab bedtime Furosemide 40 mg 1 tab daily - breakfast Tizanidine 2 mg 1 tab every 8 hrs. as needed (vial) Morphine 15 mg ER - (vial) Trazodone 150 1 tab at bedtime Hydroxyzine 25 mg 1 tab twice daily Ondansetron 4 mg 1 tab every 6 hrs as needed  Coordinated acute fill for Hydroxyzine 25 mg and Zofran 4 mgto be delivered 04/19/21. No refills needed   Patient needs refills for Morphine 15 mg, simvastatin 20 mg, and Paroxetine 40 mg, cpp- to request.  Confirmed delivery date of 04/27/21, advised patient that pharmacy will contact them the morning of delivery.   [place] Pharmacist Assistant [phone]   Time spent:60

## 2021-04-23 DIAGNOSIS — J449 Chronic obstructive pulmonary disease, unspecified: Secondary | ICD-10-CM | POA: Diagnosis not present

## 2021-04-23 NOTE — Telephone Encounter (Signed)
Faxed refill form to [employer] 8/25.  Patient reports she is completely out of Ozempic and requests samples to tide her over until shipment arrives (has been taking 2-4 weeks for shipment from [employer])

## 2021-04-23 NOTE — Telephone Encounter (Signed)
Pt notified that Ozempic sample placed in fridge. [id number] Exp 05/26/23

## 2021-04-25 DIAGNOSIS — M533 Sacrococcygeal disorders, not elsewhere classified: Secondary | ICD-10-CM | POA: Diagnosis not present

## 2021-04-25 DIAGNOSIS — M25562 Pain in left knee: Secondary | ICD-10-CM | POA: Diagnosis not present

## 2021-04-25 DIAGNOSIS — M25561 Pain in right knee: Secondary | ICD-10-CM | POA: Diagnosis not present

## 2021-04-25 DIAGNOSIS — Z79891 Long term (current) use of opiate analgesic: Secondary | ICD-10-CM | POA: Diagnosis not present

## 2021-04-25 DIAGNOSIS — M5137 Other intervertebral disc degeneration, lumbosacral region: Secondary | ICD-10-CM | POA: Diagnosis not present

## 2021-04-25 DIAGNOSIS — G894 Chronic pain syndrome: Secondary | ICD-10-CM | POA: Diagnosis not present

## 2021-05-12 DIAGNOSIS — J449 Chronic obstructive pulmonary disease, unspecified: Secondary | ICD-10-CM | POA: Diagnosis not present

## 2021-05-14 ENCOUNTER — Other Ambulatory Visit: Payer: Self-pay | Admitting: Internal Medicine

## 2021-05-17 ENCOUNTER — Telehealth: Payer: Self-pay

## 2021-05-17 ENCOUNTER — Ambulatory Visit: Payer: Medicare HMO

## 2021-05-17 NOTE — Telephone Encounter (Signed)
Called and left message today for patient. Ozempic patient assistance arrived today.  Placed in fridge in the back for patient to pick up.

## 2021-05-18 ENCOUNTER — Other Ambulatory Visit: Payer: Self-pay | Admitting: Internal Medicine

## 2021-05-18 DIAGNOSIS — E039 Hypothyroidism, unspecified: Secondary | ICD-10-CM

## 2021-05-18 DIAGNOSIS — J449 Chronic obstructive pulmonary disease, unspecified: Secondary | ICD-10-CM | POA: Diagnosis not present

## 2021-05-21 ENCOUNTER — Telehealth: Payer: Self-pay | Admitting: Pharmacist

## 2021-05-21 NOTE — Progress Notes (Signed)
Chronic Care Management Pharmacy Assistant   Name: Mary Acosta  MRN: [id number] DOB: 05/22/1956   Reason for Encounter: Medication Review    Recent office visits:  None ID  Recent consult visits:  None ID  Hospital visits:  None since last coordination call  Medications: Outpatient Encounter Medications as of 05/21/2021  Medication Sig Note   acetaminophen (TYLENOL) 500 MG tablet Take 1,000 mg by mouth 2 (two) times daily.    albuterol (VENTOLIN HFA) 108 (90 Base) MCG/ACT inhaler TAKE 2 PUFFS BY MOUTH EVERY 6 HOURS AS NEEDED FOR WHEEZE OR SHORTNESS OF BREATH    amLODipine (NORVASC) 5 MG tablet TAKE ONE TABLET BY MOUTH AT BREAKFAST AND AT BEDTIME    aspirin EC 81 MG tablet Take 1 tablet (81 mg total) by mouth daily.    diclofenac (VOLTAREN) 50 MG EC tablet Take 50 mg by mouth 3 (three) times daily.    furosemide (LASIX) 40 MG tablet TAKE ONE TABLET BY MOUTH EVERY MORNING    gabapentin (NEURONTIN) 300 MG capsule Take 300 mg by mouth 3 (three) times daily.  08/29/2020: As needed   hydrALAZINE (APRESOLINE) 100 MG tablet TAKE ONE TABLET BY MOUTH AT BREAKFAST AND AT BEDTIME    hydrOXYzine (ATARAX/VISTARIL) 25 MG tablet Take 1 tablet (25 mg total) by mouth in the morning and at bedtime.    Insulin Pen Needle (PEN NEEDLES) 31G X 8 MM MISC 1 Device by Does not apply route daily.    Insulin Syringe-Needle U-100 26G X 1/2" 1 ML MISC Use daily for insulin injection as directed    ipratropium-albuterol (DUONEB) 0.5-2.5 (3) MG/3ML SOLN Take 3 mLs by nebulization every 6 (six) hours as needed (Wheezing or dyspnea.).    levothyroxine (SYNTHROID) 200 MCG tablet TAKE ONE TABLET BY MOUTH monday-saturday before breakfast and TAKE 1/2 TABLET BY MOUTH ONCE WEEKLY BEFORE BREAKFAST ON SUNDAYS    metFORMIN (GLUCOPHAGE) 500 MG tablet Take 1 tablet (500 mg total) by mouth 2 (two) times daily with a meal.    metoprolol tartrate (LOPRESSOR) 25 MG tablet TAKE ONE TABLET BY MOUTH EVERY MORNING and  TAKE ONE TABLET BY MOUTH EVERYDAY AT BEDTIME    mirabegron ER (MYRBETRIQ) 25 MG TB24 tablet Take 1 tablet (25 mg total) by mouth daily.    morphine (MS CONTIN) 15 MG 12 hr tablet Take 15 mg by mouth daily.    morphine (MS CONTIN) 30 MG 12 hr tablet Take 30 mg by mouth 2 (two) times daily.    naloxone (NARCAN) nasal spray 4 mg/0.1 mL One spray in either nostril once for known/suspected opioid overdose. May repeat every 2-3 minutes in alternating nostril til EMS arrives    ondansetron (ZOFRAN-ODT) 4 MG disintegrating tablet DISSOLVE ONE TABLET BY MOUTH EVERY 6 HOURS AS NEEDED FOR NAUSEA AND VOMITING    PARoxetine (PAXIL) 40 MG tablet Take 1 tablet (40 mg total) by mouth daily.    potassium chloride SA (KLOR-CON M20) 20 MEQ tablet TAKE 2 TABLETS EVERY DAY AS NEEDED FOR CRAMPING 08/29/2020: 08/29/20 very rarely   Pseudoephedrine-Guaifenesin [phone] MG TB12 Take by mouth.    Semaglutide,0.25 or 0.5MG /DOS, (OZEMPIC, 0.25 OR 0.5 MG/DOSE,) 2 MG/1.5ML SOPN Inject 0.5 mg into the skin once a week. Begin once received through patient assistance. Continue Rybelsus 14 mg until then.    simvastatin (ZOCOR) 20 MG tablet Take 1 tablet (20 mg total) by mouth daily.    tiZANidine (ZANAFLEX) 2 MG tablet Take 2 mg by mouth as  needed for muscle spasms.     traZODone (DESYREL) 150 MG tablet Take by mouth at bedtime.    No facility-administered encounter medications on file as of 05/21/2021.   Reviewed chart for medication changes ahead of medication coordination call.  No OVs, Consults, or hospital visits since last care coordination call/Pharmacist visit. (If appropriate, list visit date, provider name)  No medication changes indicated OR if recent visit, treatment plan here.  BP Readings from Last 3 Encounters:  01/15/21 128/78  11/01/20 140/88  10/13/20 128/82    Lab Results  Component Value Date   HGBA1C 6.9 (A) 01/15/2021     Patient obtains medications through Adherence Packaging  30 Days   Last  adherence delivery included:  Metoprolol Tartrate 25 mg 1 tab breakfast and 1 tab bedtime Metformin 500 mg BID - 1 tab breakfast and  1 tab bedtime Simvastatin 20 mg 1 tab bedtime Amlodipine 5 mg 1 tab breakfast and 1 tab bedtime Hydralazine 100 mg 1 tab breakfast and 1 tab bedtime Levothyroxine 200 mcg 1-tab daily breakfast Paroxetine 40 mg 1 tab bedtime Furosemide 40 mg 1 tab daily - breakfast Tizanidine 2 mg 1 tab every 8 hrs. as needed (vial) Morphine 15 mg ER - (vial) Trazodone 150 1 tab at bedtime Hydroxyzine 25 mg 1 tab twice daily Ondansetron 4 mg 1 tab every 6 hrs as needed Patient is due for next adherence delivery on: 05/30/21.  Called patient and reviewed medications and coordinated delivery. This delivery to include: Metoprolol Tartrate 25 mg 1 tab breakfast and 1 tab bedtime Metformin 500 mg BID - 1 tab breakfast and  1 tab bedtime Simvastatin 20 mg 1 tab bedtime Amlodipine 5 mg 1 tab breakfast and 1 tab bedtime Hydralazine 100 mg 1 tab breakfast and 1 tab bedtime Levothyroxine 200 mcg 1-tab mon-sat and 1/2 tab on sunday Paroxetine 40 mg 1 tab bedtime Furosemide 40 mg 1 tab daily - breakfast Tizanidine 4 mg 1 tab every 8 hrs. as needed (vial) Morphine 15 mg ER - (vial) Trazodone 150 1 tab at bedtime Hydroxyzine 25 mg 1 tab twice daily morphine sul er 30 mg 1 tab at breakfast and bedtime (vial) Diclofenac sod 50 mg 1 tab 3 times daily as needed  Patient needs refills for none needed.  Confirmed delivery date of 05/30/21, advised patient that pharmacy will contact them the morning of delivery.   [place] Pharmacist Assistant [phone]   Time spent:30

## 2021-05-22 DIAGNOSIS — R69 Illness, unspecified: Secondary | ICD-10-CM | POA: Diagnosis not present

## 2021-05-22 DIAGNOSIS — F419 Anxiety disorder, unspecified: Secondary | ICD-10-CM | POA: Diagnosis not present

## 2021-05-22 DIAGNOSIS — F332 Major depressive disorder, recurrent severe without psychotic features: Secondary | ICD-10-CM | POA: Diagnosis not present

## 2021-05-22 NOTE — Addendum Note (Signed)
Addended by: Charlton Haws on: 05/22/2021 04:14 PM   Modules accepted: Orders

## 2021-05-28 ENCOUNTER — Ambulatory Visit (INDEPENDENT_AMBULATORY_CARE_PROVIDER_SITE_OTHER): Payer: Medicare HMO

## 2021-05-28 DIAGNOSIS — Z0000 Encounter for general adult medical examination without abnormal findings: Secondary | ICD-10-CM

## 2021-05-28 NOTE — Progress Notes (Signed)
I connected with Mary Acosta today by telephone and verified that I am speaking with the correct person using two identifiers. Location patient: home Location provider: work Persons participating in the virtual visit: patient, provider.   I discussed the limitations, risks, security and privacy concerns of performing an evaluation and management service by telephone and the availability of in person appointments. I also discussed with the patient that there may be a patient responsible charge related to this service. The patient expressed understanding and verbally consented to this telephonic visit.    Interactive audio and video telecommunications were attempted between this provider and patient, however failed, due to patient having technical difficulties OR patient did not have access to video capability.  We continued and completed visit with audio only.  Some vital signs may be absent or patient reported.   Time Spent with patient on telephone encounter: 40 minutes  Subjective:   Mary Acosta is a 65 y.o. female who presents for Medicare Annual (Subsequent) preventive examination.  Review of Systems     Cardiac Risk Factors include: diabetes mellitus;female gender;dyslipidemia;family history of premature cardiovascular disease;hypertension;obesity (BMI >30kg/m2);sedentary lifestyle     Objective:    There were no vitals filed for this visit. There is no height or weight on file to calculate BMI.  Advanced Directives 05/28/2021 01/28/2020 07/06/2019 05/14/2018 12/08/2017 10/07/2017 01/31/2017  Does Patient Have a Medical Advance Directive? No No No No No No No  Type of Advance Directive Living will;Healthcare Power of Attorney - - - - - -  Does patient want to make changes to medical advance directive? No - Patient declined - - - - - -  Copy of [place] in Chart? No - copy requested - - - - - -  Would patient like information on creating a medical  advance directive? No - Patient declined No - Patient declined No - Patient declined No - Patient declined Yes (Inpatient - patient requests chaplain consult to create a medical advance directive) Yes (MAU/Ambulatory/Procedural Areas - Information given) No - Patient declined  Pre-existing out of facility DNR order (yellow form or pink MOST form) - - - - - - -    Current Medications (verified) Outpatient Encounter Medications as of 05/28/2021  Medication Sig   acetaminophen (TYLENOL) 500 MG tablet Take 1,000 mg by mouth 2 (two) times daily.   albuterol (VENTOLIN HFA) 108 (90 Base) MCG/ACT inhaler TAKE 2 PUFFS BY MOUTH EVERY 6 HOURS AS NEEDED FOR WHEEZE OR SHORTNESS OF BREATH   amLODipine (NORVASC) 5 MG tablet TAKE ONE TABLET BY MOUTH AT BREAKFAST AND AT BEDTIME   aspirin EC 81 MG tablet Take 1 tablet (81 mg total) by mouth daily.   diclofenac (VOLTAREN) 50 MG EC tablet Take 50 mg by mouth 3 (three) times daily.   furosemide (LASIX) 40 MG tablet TAKE ONE TABLET BY MOUTH EVERY MORNING   gabapentin (NEURONTIN) 300 MG capsule Take 300 mg by mouth 3 (three) times daily.    hydrALAZINE (APRESOLINE) 100 MG tablet TAKE ONE TABLET BY MOUTH AT BREAKFAST AND AT BEDTIME   hydrOXYzine (ATARAX/VISTARIL) 25 MG tablet Take 1 tablet (25 mg total) by mouth in the morning and at bedtime.   Insulin Pen Needle (PEN NEEDLES) 31G X 8 MM MISC 1 Device by Does not apply route daily.   Insulin Syringe-Needle U-100 26G X 1/2" 1 ML MISC Use daily for insulin injection as directed   ipratropium-albuterol (DUONEB) 0.5-2.5 (3) MG/3ML SOLN Take  3 mLs by nebulization every 6 (six) hours as needed (Wheezing or dyspnea.).   levothyroxine (SYNTHROID) 200 MCG tablet TAKE ONE TABLET BY MOUTH monday-saturday before breakfast and TAKE 1/2 TABLET BY MOUTH ONCE WEEKLY BEFORE BREAKFAST ON SUNDAYS   metFORMIN (GLUCOPHAGE) 500 MG tablet Take 1 tablet (500 mg total) by mouth 2 (two) times daily with a meal.   metoprolol tartrate (LOPRESSOR)  25 MG tablet TAKE ONE TABLET BY MOUTH EVERY MORNING and TAKE ONE TABLET BY MOUTH EVERYDAY AT BEDTIME   morphine (MS CONTIN) 15 MG 12 hr tablet Take 15 mg by mouth daily.   morphine (MS CONTIN) 30 MG 12 hr tablet Take 30 mg by mouth 2 (two) times daily.   naloxone (NARCAN) nasal spray 4 mg/0.1 mL One spray in either nostril once for known/suspected opioid overdose. May repeat every 2-3 minutes in alternating nostril til EMS arrives   ondansetron (ZOFRAN-ODT) 4 MG disintegrating tablet DISSOLVE ONE TABLET BY MOUTH EVERY 6 HOURS AS NEEDED FOR NAUSEA AND VOMITING   PARoxetine (PAXIL) 40 MG tablet Take 1 tablet (40 mg total) by mouth daily.   potassium chloride SA (KLOR-CON M20) 20 MEQ tablet TAKE 2 TABLETS EVERY DAY AS NEEDED FOR CRAMPING   Pseudoephedrine-Guaifenesin [phone] MG TB12 Take by mouth.   Semaglutide,0.25 or 0.5MG /DOS, (OZEMPIC, 0.25 OR 0.5 MG/DOSE,) 2 MG/1.5ML SOPN Inject 0.5 mg into the skin once a week. Begin once received through patient assistance. Continue Rybelsus 14 mg until then.   simvastatin (ZOCOR) 20 MG tablet Take 1 tablet (20 mg total) by mouth daily.   tiZANidine (ZANAFLEX) 2 MG tablet Take 2 mg by mouth as needed for muscle spasms.    traZODone (DESYREL) 150 MG tablet Take by mouth at bedtime.   No facility-administered encounter medications on file as of 05/28/2021.    Allergies (verified) Gabapentin, Losartan, Aricept [donepezil hcl], Oxycodone, Sulfa antibiotics, and Sulfonamide derivatives   History: Past Medical History:  Diagnosis Date   AKI (acute kidney injury) ([place]) 01/2017   Anxiety    with panic attacks   Arthritis    "back; feet; hands; shoulders" (08/26/2014)   Asthma    Cervical cancer ([place])    Chronic lower back pain    Chronic narcotic use    Chronic pain syndrome    [facility]   Cirrhosis ([place])    Clostridium difficile infection 2017   COPD (chronic obstructive pulmonary disease) ([facility])    Daily headache    Depression     Diabetic neuropathy ([place]) 06/04/2017   DJD (degenerative joint disease)    Fatty liver disease, nonalcoholic    Fibromyalgia    Frequency of urination    HCAP (healthcare-associated pneumonia) 08/26/2014   History of TIA (transient ischemic attack) 11-01-2010   NO RESIDUAL   Hyperlipidemia    Hypertension    Hypothyroidism    IDDM (insulin dependent diabetes mellitus)    Insomnia    Lumbar stenosis    Memory difficulty 07/25/2016   Nocturia    OSA (obstructive sleep apnea)    NO CPAP SINCE WT LOSS   Osteoarthritis    with severe disease in knee   Pneumonia "several times"   Polymyalgia rheumatica ([place])    Scoliosis    Seasonal allergies    Thyroid cancer ([place])    Urgency of urination    Vaginal pain S/P SLING  FEB 2012   Past Surgical History:  Procedure Laterality Date   APPENDECTOMY  1982   BREAST EXCISIONAL BIOPSY  Left 02/28/2005   Atypical Ductal Hyperplasia   CARDIAC CATHETERIZATION  09-04-2004   NORMAL CORONARY ANATOMY/ NORMAL LVF/ EF 60%   CARDIOVASCULAR STRESS TEST  12-27-2010  DR [place]   ABNORMAL NUCLEAR STUDY W/ /MILD INFERIOR ISCHEMIA/ EF 69%/  CT HEART ANGIOGRAM ;  NO ACUTE FINDINGS   CRYOABLATION  05/16/2003   w/LEEP FOR ABNORMAL PAP SMEAR   CYSTOSCOPY  05/18/2012   Procedure: CYSTOSCOPY;  Surgeon: Reece Packer, MD;  Location: [facility];  Service: Urology;  Laterality: N/A;  examination under anethesia   ESOPHAGOGASTRODUODENOSCOPY (EGD) WITH PROPOFOL N/A 09/03/2016   Procedure: ESOPHAGOGASTRODUODENOSCOPY (EGD) WITH PROPOFOL;  Surgeon: Doran Stabler, MD;  Location: [facility];  Service: Gastroenterology;  Laterality: N/A;   HYSTEROSCOPY WITH D & C  08-19-2007   PMB   KNEE ARTHROSCOPY W/ DEBRIDEMENT Left 03/29/2006   INTERNAL DERANGEMENT/ SEVERE DJD/ MENISCUS TEARS   LAPAROSCOPIC CHOLECYSTECTOMY  06-10-2002   LAPAROSCOPIC GASTRIC BANDING  03/01/2006   TRUNCAL VAGOTOMY/ PLACEMENT OF VG BAND   REVISION TOTAL KNEE ARTHROPLASTY Left  08-29-2008; 05/2009   TONSILLECTOMY  1969   TOTAL KNEE ARTHROPLASTY Left 01-23-2007   SEVERE DJD   TOTAL THYROIDECTOMY  11-22-2005   BILATERAL THYROID NODULES-- PAPILLARY CARCINOMA (0.5CM)/ ADENOMATOID NODULES   TRANSTHORACIC ECHOCARDIOGRAM  12-27-2010   LVSF NORMAL / EF 00-93%/ GRADE I DIASTOLIC DYSFUNCTION/ MILD MITRAL REGURG. / MILDLY DILATED LEFT ATRIUM/ MILDY INCREASED SYSTOLIC PRESSURE OF PULMONARY ARTERIES   TRANSVAGINAL SUBURETERAL TAPE/ SLING  09-28-2010   MIXED URINARY INCONTINENCE   TUBAL LIGATION  1983   Family History  Problem Relation Age of Onset   Diabetes Mother    Heart disease Mother    Dementia Mother    Heart disease Father    High blood pressure Father    Colon cancer Maternal Uncle        x 2   Breast cancer Other        great aunts x 5   Social History   Socioeconomic History   Marital status: Married    Spouse name: Not on file   Number of children: 2   Years of education: 12   Highest education level: Not on file  Occupational History   Occupation: disabled    Employer: UNEMPLOYED  Tobacco Use   Smoking status: Former    Packs/day: 2.50    Years: 39.00    Pack years: 97.50    Types: Cigarettes    Quit date: 10/22/2010    Years since quitting: 10.6   Smokeless tobacco: Never  Vaping Use   Vaping Use: Never used  Substance and Sexual Activity   Alcohol use: No    Alcohol/week: 0.0 standard drinks   Drug use: No   Sexual activity: Not Currently  Other Topics Concern   Not on file  Social History Narrative   Lives at home w/ her husband    Right-handed   Caffeine: 1 cup of coffee per week + [employer]   Social Determinants of Health   Financial Resource Strain: Medium Risk   Difficulty of Paying Living Expenses: Somewhat hard  Food Insecurity: No Food Insecurity   Worried About Charity fundraiser in the Last Year: Never true   Arboriculturist in the Last Year: Never true  Transportation Needs: No Transportation Needs   Lack of  Transportation (Medical): No   Lack of Transportation (Non-Medical): No  Physical Activity: Inactive   Days of Exercise per Week: 0 days  Minutes of Exercise per Session: 0 min  Stress: Stress Concern Present   Feeling of Stress : To some extent  Social Connections: Moderately Integrated   Frequency of Communication with Friends and Family: More than three times a week   Frequency of Social Gatherings with Friends and Family: More than three times a week   Attends Religious Services: More than 4 times per year   Active Member of Clubs or Organizations: No   Attends Music therapist: More than 4 times per year   Marital Status: Widowed    Tobacco Counseling Counseling given: Not Answered   Clinical Intake:  Pre-visit preparation completed: Yes  Pain : No/denies pain     Nutritional Risks: None Diabetes: Yes CBG done?: Yes (450 this morning) Did pt. bring in CBG monitor from home?: No  How often do you need to have someone help you when you read instructions, pamphlets, or other written materials from your doctor or pharmacy?: 1 - Never What is the last grade level you completed in school?: HSG  Diabetic? no  Interpreter Needed?: No  Information entered by :: Lisette Abu, LPN   Activities of Daily Living In your present state of health, do you have any difficulty performing the following activities: 05/28/2021  Hearing? N  Vision? Y  Difficulty concentrating or making decisions? Y  Walking or climbing stairs? Y  Dressing or bathing? Y  Doing errands, shopping? Y  Preparing Food and eating ? Y  Using the Toilet? N  In the past six months, have you accidently leaked urine? Y  Do you have problems with loss of bowel control? Y  Managing your Medications? Y  Managing your Finances? Y  Housekeeping or managing your Housekeeping? Y  Some recent data might be hidden    Patient Care Team: Binnie Rail, MD as PCP - General (Internal  Medicine) Stanford Breed Denice Bors, MD as PCP - Cardiology (Cardiology) Charlton Haws, [facility] as Pharmacist (Pharmacist) Desma Maxim, MD as Referring Physician (Ophthalmology) Opthamology, [facility] as Consulting Physician (Ophthalmology)  Indicate any recent Medical Services you may have received from other than [facility] providers in the past year (date may be approximate).     Assessment:   This is a routine wellness examination for Mary Acosta.  Hearing/Vision screen Hearing Screening - Comments:: Patient denied any hearing difficulty.   No hearing aids.  Vision Screening - Comments:: Patient wears corrective glasses/contacts.   Has history of cataracts and macular degeneration. Eye exam done annually by: Ernst Breach, MD.  Dietary issues and exercise activities discussed: Current Exercise Habits: The patient does not participate in regular exercise at present, Exercise limited by: Other - see comments;psychological condition(s);cardiac condition(s);respiratory conditions(s);orthopedic condition(s) (morbid obesity)   Goals Addressed   None   Depression Screen PHQ 2/9 Scores 05/28/2021 01/28/2020 09/04/2018 06/13/2017 06/04/2016 03/06/2016 05/15/2015  PHQ - 2 Score 6 1 0 0 0 0 0  PHQ- 9 Score 19 - - - - - -    Fall Risk Fall Risk  05/28/2021 01/28/2020 06/13/2017 06/04/2016 03/06/2016  Falls in the past year? 1 1 No No No  Number falls in past yr: 0 1 - - -  Injury with Fall? 0 0 - - -  Risk for fall due to : No Fall Risks Impaired balance/gait;Impaired mobility;Orthopedic patient - - -  Risk for fall due to: Comment - - - - -  Follow up - Falls evaluation completed;Education provided;Falls prevention discussed - - -  FALL RISK PREVENTION PERTAINING TO THE HOME:  Any stairs in or around the home? Yes  If so, are there any without handrails? No  Home free of loose throw rugs in walkways, pet beds, electrical cords, etc? Yes  Adequate lighting in your home to reduce risk of falls? Yes    ASSISTIVE DEVICES UTILIZED TO PREVENT FALLS:  Life alert? No  Use of a cane, walker or w/c? Yes  Grab bars in the bathroom? No  Shower chair or bench in shower? No  Elevated toilet seat or a handicapped toilet? No   TIMED UP AND GO:  Was the test performed? No .  Length of time to ambulate 10 feet: n/a sec.   Gait slow and steady with assistive device  Cognitive Function: Normal cognitive status assessed by direct observation by this Nurse Health Advisor. No abnormalities found.   MMSE - Mini Mental State Exam 08/29/2020 12/03/2017 06/04/2017 12/27/2016 07/25/2016  Orientation to time 4 5 5 4 5   Orientation to Place 5 4 5 5 5   Registration 1 3 3 3 3   Attention/ Calculation 1 3 1 5 5   Recall 1 2 1 1 1   Language- name 2 objects 2 2 2 2 2   Language- repeat 1 1 1 1 1   Language- follow 3 step command 3 3 2 3 2   Language- read & follow direction 1 1 1 1 1   Write a sentence 1 1 1 1 1   Copy design 0 1 0 0 1  Total score 20 26 22 26 27         Immunizations Immunization History  Administered Date(s) Administered   Hep A / Hep B 02/05/2017   Hepatitis B, adult 03/05/2017, 08/14/2017   Influenza,inj,Quad PF,6+ Mos 05/27/2015, 05/30/2016, 06/13/2017, 05/05/2018, 05/22/2019, 05/03/2020   Influenza,inj,quad, With Preservative 04/26/2018, 05/24/2019   Influenza-Unspecified 05/26/2014   Moderna Sars-Covid-2 Vaccination 10/05/2019, 11/02/2019   Pneumococcal Conjugate-13 10/23/2015   Pneumococcal Polysaccharide-23 07/10/2014    TDAP status: Due, Education has been provided regarding the importance of this vaccine. Advised may receive this vaccine at local pharmacy or Health Dept. Aware to provide a copy of the vaccination record if obtained from local pharmacy or Health Dept. Verbalized acceptance and understanding.  Flu Vaccine status: Due, Education has been provided regarding the importance of this vaccine. Advised may receive this vaccine at local pharmacy or Health Dept. Aware to  provide a copy of the vaccination record if obtained from local pharmacy or Health Dept. Verbalized acceptance and understanding.  Pneumococcal vaccine status: Up to date  Covid-19 vaccine status: Completed vaccines  Qualifies for Shingles Vaccine? Yes   Zostavax completed No   Shingrix Completed?: No.    Education has been provided regarding the importance of this vaccine. Patient has been advised to call insurance company to determine out of pocket expense if they have not yet received this vaccine. Advised may also receive vaccine at local pharmacy or Health Dept. Verbalized acceptance and understanding.  Screening Tests Health Maintenance  Topic Date Due   Zoster Vaccines- Shingrix (1 of 2) Never done   COLONOSCOPY (Pts 45-43yrs Insurance coverage will need to be confirmed)  11/25/2017   PAP SMEAR-Modifier  05/27/2019   FOOT EXAM  10/09/2019   COVID-19 Vaccine (3 - Moderna risk series) 11/30/2019   OPHTHALMOLOGY EXAM  02/08/2020   INFLUENZA VACCINE  03/26/2021   TETANUS/TDAP  11/01/2021 (Originally 06/16/1975)   HEMOGLOBIN A1C  07/18/2021   MAMMOGRAM  03/25/2023   Hepatitis C Screening  Completed   HIV Screening  Completed   HPV VACCINES  Aged Out    Health Maintenance  Health Maintenance Due  Topic Date Due   Zoster Vaccines- Shingrix (1 of 2) Never done   COLONOSCOPY (Pts 45-44yrs Insurance coverage will need to be confirmed)  11/25/2017   PAP SMEAR-Modifier  05/27/2019   FOOT EXAM  10/09/2019   COVID-19 Vaccine (3 - Moderna risk series) 11/30/2019   OPHTHALMOLOGY EXAM  02/08/2020   INFLUENZA VACCINE  03/26/2021    Colorectal cancer screening: Type of screening: Colonoscopy. Completed 11/26/2007. Repeat every 10 years  Mammogram status: Completed 03/24/2021. Repeat every year  Lung Cancer Screening: (Low Dose CT Chest recommended if Age 23-80 years, 30 pack-year currently smoking OR have quit w/in 15years.) does qualify.   Lung Cancer Screening Referral:  no  Additional Screening:  Hepatitis C Screening: does qualify; Completed yes  Vision Screening: Recommended annual ophthalmology exams for early detection of glaucoma and other disorders of the eye. Is the patient up to date with their annual eye exam?  Yes  Who is the provider or what is the name of the office in which the patient attends annual eye exams? Ernst Breach, MD. If pt is not established with a provider, would they like to be referred to a provider to establish care? No .   Dental Screening: Recommended annual dental exams for proper oral hygiene  Community Resource Referral / Chronic Care Management: CRR required this visit?  No   CCM required this visit?  No      Plan:     I have personally reviewed and noted the following in the patient's chart:   Medical and social history Use of alcohol, tobacco or illicit drugs  Current medications and supplements including opioid prescriptions.  Functional ability and status Nutritional status Physical activity Advanced directives List of other physicians Hospitalizations, surgeries, and ER visits in previous 12 months Vitals Screenings to include cognitive, depression, and falls Referrals and appointments  In addition, I have reviewed and discussed with patient certain preventive protocols, quality metrics, and best practice recommendations. A written personalized care plan for preventive services as well as general preventive health recommendations were provided to patient.     Sheral Flow, LPN   [id number]   Nurse Notes:  Patient is cogitatively intact. There were no vitals filed for this visit. There is no height or weight on file to calculate BMI. Medications reviewed with patient; yes opioid use noted.

## 2021-05-28 NOTE — Patient Instructions (Signed)
[name] , Thank you for taking time to come for your Medicare Wellness Visit. I appreciate your ongoing commitment to your health goals. Please review the following plan we discussed and let me know if I can assist you in the future.   Screening recommendations/referrals: Colonoscopy: 11/26/2007; due every 10 years Mammogram: 03/24/2021 Bone Density: never done Recommended yearly ophthalmology/optometry visit for glaucoma screening and checkup Recommended yearly dental visit for hygiene and checkup  Vaccinations: Influenza vaccine: 05/03/2020 Pneumococcal vaccine: 07/10/2014, 10/23/2015 Tdap vaccine: never done Shingles vaccine: never done  Covid-19:  10/05/2019, 11/02/2019  Advanced directives: Please bring a copy of your health care power of attorney and living will to the office at your convenience.  Conditions/risks identified: Yes; No goals at this time  Next appointment: Please schedule your next Medicare Wellness Visit with your Nurse Health Advisor in 1 year by calling [phone].  Preventive Care 40-64 Years, Female Preventive care refers to lifestyle choices and visits with your health care provider that can promote health and wellness. What does preventive care include? A yearly physical exam. This is also called an annual well check. Dental exams once or twice a year. Routine eye exams. Ask your health care provider how often you should have your eyes checked. Personal lifestyle choices, including: Daily care of your teeth and gums. Regular physical activity. Eating a healthy diet. Avoiding tobacco and drug use. Limiting alcohol use. Practicing safe sex. Taking low-dose aspirin daily starting at age 66. Taking vitamin and mineral supplements as recommended by your health care provider. What happens during an annual well check? The services and screenings done by your health care provider during your annual well check will depend on your age, overall health,  lifestyle risk factors, and family history of disease. Counseling  Your health care provider may ask you questions about your: Alcohol use. Tobacco use. Drug use. Emotional well-being. Home and relationship well-being. Sexual activity. Eating habits. Work and work Statistician. Method of birth control. Menstrual cycle. Pregnancy history. Screening  You may have the following tests or measurements: Height, weight, and BMI. Blood pressure. Lipid and cholesterol levels. These may be checked every 5 years, or more frequently if you are over 48 years old. Skin check. Lung cancer screening. You may have this screening every year starting at age 22 if you have a 30-pack-year history of smoking and currently smoke or have quit within the past 15 years. Fecal occult blood test (FOBT) of the stool. You may have this test every year starting at age 47. Flexible sigmoidoscopy or colonoscopy. You may have a sigmoidoscopy every 5 years or a colonoscopy every 10 years starting at age 57. Hepatitis C blood test. Hepatitis B blood test. Sexually transmitted disease (STD) testing. Diabetes screening. This is done by checking your blood sugar (glucose) after you have not eaten for a while (fasting). You may have this done every 1-3 years. Mammogram. This may be done every 1-2 years. Talk to your health care provider about when you should start having regular mammograms. This may depend on whether you have a family history of breast cancer. BRCA-related cancer screening. This may be done if you have a family history of breast, ovarian, tubal, or peritoneal cancers. Pelvic exam and Pap test. This may be done every 3 years starting at age 94. Starting at age 84, this may be done every 5 years if you have a Pap test in combination with an HPV test. Bone density scan. This is done to screen  for osteoporosis. You may have this scan if you are at high risk for osteoporosis. Discuss your test results, treatment  options, and if necessary, the need for more tests with your health care provider. Vaccines  Your health care provider may recommend certain vaccines, such as: Influenza vaccine. This is recommended every year. Tetanus, diphtheria, and acellular pertussis (Tdap, Td) vaccine. You may need a Td booster every 10 years. Zoster vaccine. You may need this after age 79. Pneumococcal 13-valent conjugate (PCV13) vaccine. You may need this if you have certain conditions and were not previously vaccinated. Pneumococcal polysaccharide (PPSV23) vaccine. You may need one or two doses if you smoke cigarettes or if you have certain conditions. Talk to your health care provider about which screenings and vaccines you need and how often you need them. This information is not intended to replace advice given to you by your health care provider. Make sure you discuss any questions you have with your health care provider. Document Released: 09/08/2015 Document Revised: 05/01/2016 Document Reviewed: 06/13/2015 Elsevier Interactive Patient Education  2017 [place] Prevention in the Home Falls can cause injuries. They can happen to people of all ages. There are many things you can do to make your home safe and to help prevent falls. What can I do on the outside of my home? Regularly fix the edges of walkways and driveways and fix any cracks. Remove anything that might make you trip as you walk through a door, such as a raised step or threshold. Trim any bushes or trees on the path to your home. Use bright outdoor lighting. Clear any walking paths of anything that might make someone trip, such as rocks or tools. Regularly check to see if handrails are loose or broken. Make sure that both sides of any steps have handrails. Any raised decks and porches should have guardrails on the edges. Have any leaves, snow, or ice cleared regularly. Use sand or salt on walking paths during winter. Clean up any  spills in your garage right away. This includes oil or grease spills. What can I do in the bathroom? Use night lights. Install grab bars by the toilet and in the tub and shower. Do not use towel bars as grab bars. Use non-skid mats or decals in the tub or shower. If you need to sit down in the shower, use a plastic, non-slip stool. Keep the floor dry. Clean up any water that spills on the floor as soon as it happens. Remove soap buildup in the tub or shower regularly. Attach bath mats securely with double-sided non-slip rug tape. Do not have throw rugs and other things on the floor that can make you trip. What can I do in the bedroom? Use night lights. Make sure that you have a light by your bed that is easy to reach. Do not use any sheets or blankets that are too big for your bed. They should not hang down onto the floor. Have a firm chair that has side arms. You can use this for support while you get dressed. Do not have throw rugs and other things on the floor that can make you trip. What can I do in the kitchen? Clean up any spills right away. Avoid walking on wet floors. Keep items that you use a lot in easy-to-reach places. If you need to reach something above you, use a strong step stool that has a grab bar. Keep electrical cords out of the way. Do  not use floor polish or wax that makes floors slippery. If you must use wax, use non-skid floor wax. Do not have throw rugs and other things on the floor that can make you trip. What can I do with my stairs? Do not leave any items on the stairs. Make sure that there are handrails on both sides of the stairs and use them. Fix handrails that are broken or loose. Make sure that handrails are as long as the stairways. Check any carpeting to make sure that it is firmly attached to the stairs. Fix any carpet that is loose or worn. Avoid having throw rugs at the top or bottom of the stairs. If you do have throw rugs, attach them to the floor  with carpet tape. Make sure that you have a light switch at the top of the stairs and the bottom of the stairs. If you do not have them, ask someone to add them for you. What else can I do to help prevent falls? Wear shoes that: Do not have high heels. Have rubber bottoms. Are comfortable and fit you well. Are closed at the toe. Do not wear sandals. If you use a stepladder: Make sure that it is fully opened. Do not climb a closed stepladder. Make sure that both sides of the stepladder are locked into place. Ask someone to hold it for you, if possible. Clearly mark and make sure that you can see: Any grab bars or handrails. First and last steps. Where the edge of each step is. Use tools that help you move around (mobility aids) if they are needed. These include: Canes. Walkers. Scooters. Crutches. Turn on the lights when you go into a dark area. Replace any light bulbs as soon as they burn out. Set up your furniture so you have a clear path. Avoid moving your furniture around. If any of your floors are uneven, fix them. If there are any pets around you, be aware of where they are. Review your medicines with your doctor. Some medicines can make you feel dizzy. This can increase your chance of falling. Ask your doctor what other things that you can do to help prevent falls. This information is not intended to replace advice given to you by your health care provider. Make sure you discuss any questions you have with your health care provider. Document Released: 06/08/2009 Document Revised: 01/18/2016 Document Reviewed: 09/16/2014 Elsevier Interactive Patient Education  2017 [employer].

## 2021-05-30 ENCOUNTER — Emergency Department (HOSPITAL_COMMUNITY)
Admission: EM | Admit: 2021-05-30 | Discharge: 2021-05-30 | Disposition: A | Discharge status: Home / Self Care | Payer: Medicare HMO | Attending: Emergency Medicine | Admitting: Emergency Medicine

## 2021-05-30 ENCOUNTER — Emergency Department (HOSPITAL_COMMUNITY): Payer: Medicare HMO

## 2021-05-30 ENCOUNTER — Telehealth: Payer: Self-pay | Admitting: Internal Medicine

## 2021-05-30 ENCOUNTER — Encounter (HOSPITAL_COMMUNITY): Payer: Self-pay | Admitting: Emergency Medicine

## 2021-05-30 ENCOUNTER — Other Ambulatory Visit: Payer: Self-pay

## 2021-05-30 ENCOUNTER — Ambulatory Visit: Payer: Medicare HMO | Admitting: Nurse Practitioner

## 2021-05-30 DIAGNOSIS — J449 Chronic obstructive pulmonary disease, unspecified: Secondary | ICD-10-CM | POA: Diagnosis not present

## 2021-05-30 DIAGNOSIS — Z79899 Other long term (current) drug therapy: Secondary | ICD-10-CM | POA: Diagnosis not present

## 2021-05-30 DIAGNOSIS — Y92009 Unspecified place in unspecified non-institutional (private) residence as the place of occurrence of the external cause: Secondary | ICD-10-CM | POA: Diagnosis not present

## 2021-05-30 DIAGNOSIS — Z85850 Personal history of malignant neoplasm of thyroid: Secondary | ICD-10-CM | POA: Insufficient documentation

## 2021-05-30 DIAGNOSIS — J45909 Unspecified asthma, uncomplicated: Secondary | ICD-10-CM | POA: Insufficient documentation

## 2021-05-30 DIAGNOSIS — E1159 Type 2 diabetes mellitus with other circulatory complications: Secondary | ICD-10-CM | POA: Insufficient documentation

## 2021-05-30 DIAGNOSIS — Z96652 Presence of left artificial knee joint: Secondary | ICD-10-CM | POA: Diagnosis not present

## 2021-05-30 DIAGNOSIS — W19XXXA Unspecified fall, initial encounter: Secondary | ICD-10-CM | POA: Insufficient documentation

## 2021-05-30 DIAGNOSIS — Z87891 Personal history of nicotine dependence: Secondary | ICD-10-CM | POA: Insufficient documentation

## 2021-05-30 DIAGNOSIS — Z8541 Personal history of malignant neoplasm of cervix uteri: Secondary | ICD-10-CM | POA: Diagnosis not present

## 2021-05-30 DIAGNOSIS — E039 Hypothyroidism, unspecified: Secondary | ICD-10-CM | POA: Insufficient documentation

## 2021-05-30 DIAGNOSIS — R519 Headache, unspecified: Secondary | ICD-10-CM | POA: Diagnosis not present

## 2021-05-30 DIAGNOSIS — M542 Cervicalgia: Secondary | ICD-10-CM | POA: Insufficient documentation

## 2021-05-30 DIAGNOSIS — R791 Abnormal coagulation profile: Secondary | ICD-10-CM | POA: Insufficient documentation

## 2021-05-30 DIAGNOSIS — S0990XA Unspecified injury of head, initial encounter: Secondary | ICD-10-CM | POA: Diagnosis not present

## 2021-05-30 DIAGNOSIS — Z043 Encounter for examination and observation following other accident: Secondary | ICD-10-CM | POA: Diagnosis not present

## 2021-05-30 DIAGNOSIS — Z7982 Long term (current) use of aspirin: Secondary | ICD-10-CM | POA: Insufficient documentation

## 2021-05-30 DIAGNOSIS — E1140 Type 2 diabetes mellitus with diabetic neuropathy, unspecified: Secondary | ICD-10-CM | POA: Diagnosis not present

## 2021-05-30 DIAGNOSIS — Z7984 Long term (current) use of oral hypoglycemic drugs: Secondary | ICD-10-CM | POA: Diagnosis not present

## 2021-05-30 DIAGNOSIS — I10 Essential (primary) hypertension: Secondary | ICD-10-CM | POA: Insufficient documentation

## 2021-05-30 DIAGNOSIS — Z794 Long term (current) use of insulin: Secondary | ICD-10-CM | POA: Insufficient documentation

## 2021-05-30 LAB — CBC WITH DIFFERENTIAL/PLATELET
Abs Immature Granulocytes: 0.02 10*3/uL (ref 0.00–0.07)
Basophils Absolute: 0.1 10*3/uL (ref 0.0–0.1)
Basophils Relative: 1 %
Eosinophils Absolute: 0.3 10*3/uL (ref 0.0–0.5)
Eosinophils Relative: 2 %
HCT: 43.4 % (ref 36.0–46.0)
Hemoglobin: 13.1 g/dL (ref 12.0–15.0)
Immature Granulocytes: 0 %
Lymphocytes Relative: 10 %
Lymphs Abs: 1.2 10*3/uL (ref 0.7–4.0)
MCH: 25.1 pg — ABNORMAL LOW (ref 26.0–34.0)
MCHC: 30.2 g/dL (ref 30.0–36.0)
MCV: 83.3 fL (ref 80.0–100.0)
Monocytes Absolute: 0.6 10*3/uL (ref 0.1–1.0)
Monocytes Relative: 5 %
Neutro Abs: 9.8 10*3/uL — ABNORMAL HIGH (ref 1.7–7.7)
Neutrophils Relative %: 82 %
Platelets: 176 10*3/uL (ref 150–400)
RBC: 5.21 MIL/uL — ABNORMAL HIGH (ref 3.87–5.11)
RDW: 13.3 % (ref 11.5–15.5)
WBC: 11.9 10*3/uL — ABNORMAL HIGH (ref 4.0–10.5)
nRBC: 0 % (ref 0.0–0.2)

## 2021-05-30 LAB — URINALYSIS, ROUTINE W REFLEX MICROSCOPIC
Bilirubin Urine: NEGATIVE
Glucose, UA: NEGATIVE mg/dL
Hgb urine dipstick: NEGATIVE
Ketones, ur: NEGATIVE mg/dL
Nitrite: NEGATIVE
Protein, ur: NEGATIVE mg/dL
Specific Gravity, Urine: 1.014 (ref 1.005–1.030)
pH: 5 (ref 5.0–8.0)

## 2021-05-30 LAB — RAPID URINE DRUG SCREEN, HOSP PERFORMED
Amphetamines: NOT DETECTED
Barbiturates: NOT DETECTED
Benzodiazepines: NOT DETECTED
Cocaine: NOT DETECTED
Opiates: POSITIVE — AB
Tetrahydrocannabinol: NOT DETECTED

## 2021-05-30 LAB — TROPONIN I (HIGH SENSITIVITY)
Troponin I (High Sensitivity): 7 ng/L (ref <18)
Troponin I (High Sensitivity): 7 ng/L (ref <18)

## 2021-05-30 LAB — ETHANOL: Alcohol, Ethyl (B): <10 mg/dL (ref <10)

## 2021-05-30 LAB — COMPREHENSIVE METABOLIC PANEL
ALT: 18 U/L (ref 0–44)
AST: 23 U/L (ref 15–41)
Albumin: 3.6 g/dL (ref 3.5–5.0)
Alkaline Phosphatase: 97 U/L (ref 38–126)
Anion gap: 11 (ref 5–15)
BUN: 17 mg/dL (ref 8–23)
CO2: 26 mmol/L (ref 22–32)
Calcium: 8.7 mg/dL — ABNORMAL LOW (ref 8.9–10.3)
Chloride: 94 mmol/L — ABNORMAL LOW (ref 98–111)
Creatinine, Ser: 1.33 mg/dL — ABNORMAL HIGH (ref 0.44–1.00)
GFR, Estimated: 45 mL/min — ABNORMAL LOW (ref >60)
Glucose, Bld: 237 mg/dL — ABNORMAL HIGH (ref 70–99)
Potassium: 4 mmol/L (ref 3.5–5.1)
Sodium: 131 mmol/L — ABNORMAL LOW (ref 135–145)
Total Bilirubin: 0.5 mg/dL (ref 0.3–1.2)
Total Protein: 7.1 g/dL (ref 6.5–8.1)

## 2021-05-30 LAB — AMMONIA: Ammonia: 45 umol/L — ABNORMAL HIGH (ref 9–35)

## 2021-05-30 LAB — LACTIC ACID, PLASMA
Lactic Acid, Venous: 2 mmol/L (ref 0.5–1.9)
Lactic Acid, Venous: 1.3 mmol/L (ref 0.5–1.9)

## 2021-05-30 LAB — PROTIME-INR
INR: 1.1 (ref 0.8–1.2)
Prothrombin Time: 14.2 seconds (ref 11.4–15.2)

## 2021-05-30 NOTE — ED Notes (Addendum)
This RN was notified by ED staff that this pt left prior to receiving d/c paperwork. NO IV was noted to be in place when first assessing pt, EDP had also seen pt prior to her leaving.

## 2021-05-30 NOTE — Discharge Instructions (Addendum)
Return for any problem.  ?

## 2021-05-30 NOTE — ED Notes (Signed)
Got pt back in bed, reassessed vitals/temp. Pt c/o nausea and took 2 of her home Zofran ODT tablets.

## 2021-05-30 NOTE — ED Notes (Signed)
PT left before discharge

## 2021-05-30 NOTE — ED Notes (Signed)
ED Provider at bedside. 

## 2021-05-30 NOTE — ED Provider Notes (Signed)
[facility] EMERGENCY DEPARTMENT Provider Note   CSN: [id number] Arrival date & time: 05/30/21  1603     History Chief Complaint  Patient presents with   Mary Acosta is a 65 y.o. female.  65 year old female with prior medical history as detailed below presents for evaluation.  Patient reports that she was unable to be seen by her PCP today.  She was referred to the ED for evaluation.  She has multiple complaints.  She reports a fall that occurred several months ago.  She struck her head at this time.  Ever since she has experienced intermittent headache, neck pain, and feeling forgetful.  She reports intermittent visual hallucinations at home.  Typical hallucination is seeing people on the TV who are not there.  She denies any active hallucination at this time.  She reports that she feels comfortable during my evaluation.  She denies current pain.  She denies recent fever.  She denies associated shortness of breath, nausea, vomiting, focal weakness, or other acute complaint.  The history is provided by the patient.  Fall This is a recurrent problem. The current episode started more than 1 week ago. The problem occurs every several days. The problem has not changed since onset.Nothing aggravates the symptoms. Nothing relieves the symptoms.      Past Medical History:  Diagnosis Date   AKI (acute kidney injury) ([place]) 01/2017   Anxiety    with panic attacks   Arthritis    "back; feet; hands; shoulders" (08/26/2014)   Asthma    Cervical cancer ([place])    Chronic lower back pain    Chronic narcotic use    Chronic pain syndrome    [facility]   Cirrhosis ([place])    Clostridium difficile infection 2017   COPD (chronic obstructive pulmonary disease) (HCC)    Daily headache    Depression    Diabetic neuropathy ([place]) 06/04/2017   DJD (degenerative joint disease)    Fatty liver disease, nonalcoholic    Fibromyalgia    Frequency of  urination    HCAP (healthcare-associated pneumonia) 08/26/2014   History of TIA (transient ischemic attack) 11-01-2010   NO RESIDUAL   Hyperlipidemia    Hypertension    Hypothyroidism    IDDM (insulin dependent diabetes mellitus)    Insomnia    Lumbar stenosis    Memory difficulty 07/25/2016   Nocturia    OSA (obstructive sleep apnea)    NO CPAP SINCE WT LOSS   Osteoarthritis    with severe disease in knee   Pneumonia "several times"   Polymyalgia rheumatica ([place])    Scoliosis    Seasonal allergies    Thyroid cancer ([place])    Urgency of urination    Vaginal pain S/P SLING  FEB 2012    Patient Active Problem List   Diagnosis Date Noted   Hallucinations 03/06/2021   Aortic atherosclerosis ([place]) 11/01/2020   URI (upper respiratory infection) 09/01/2020   Uncontrolled type 2 diabetes mellitus with circulatory disorder, with long-term current use of insulin 07/05/2020   PSVT (paroxysmal supraventricular tachycardia) ([place]) 05/31/2020   UTI (urinary tract infection) 03/01/2020   Grief 02/03/2019   Nausea 01/04/2019   Reactive airway disease 10/21/2018   Arthralgia 09/04/2018   Thyroid cancer ([place]) 04/30/2018   Obesity hypoventilation syndrome ([place]) 09/24/2017   Palpitations 09/18/2017   Diabetic neuropathy ([place]) 06/04/2017   Vitamin B12 deficiency 03/05/2017   Numbness and tingling in both hands  02/24/2017   OSA (obstructive sleep apnea) 01/31/2017   Liver cirrhosis secondary to NASH ([place]) 01/31/2017   Hypothyroidism 01/31/2017   Chronic narcotic use 01/31/2017   Fibromyalgia 01/31/2017   Hyperlipidemia 01/31/2017   Gastroparesis    Memory disorder 07/25/2016   Lump in neck 01/29/2016   Chronic diastolic (congestive) heart failure ([place]) 01/03/2016   Fatty liver 01/03/2016   Type 1 diabetes mellitus ([place]) 01/03/2016   Recurrent Clostridium difficile diarrhea 01/03/2016   Chronic respiratory failure ([place]) 11/10/2014   Physical deconditioning 09/26/2014   Dyspnea  09/26/2014   Hyperglycemia 07/09/2014   Iron deficiency anemia, unspecified  04/05/2011   Bariatric surgery status 04/05/2011   Left arm pain    Vitamin D deficiency 10/22/2007   LOW BACK PAIN, CHRONIC 10/22/2007   INSOMNIA 10/22/2007   Obesity 10/14/2007   Chronic pain syndrome 10/14/2007   CARPAL TUNNEL SYNDROME 10/14/2007   ALLERGIC RHINITIS CAUSE UNSPECIFIED 10/14/2007   ARTHRITIS 10/14/2007   Anxiety state 08/25/2007   Depression 08/25/2007   Essential hypertension 08/25/2007   ASTHMA 08/25/2007   CONSTIPATION 08/25/2007   POLYMYALGIA RHEUMATICA 08/25/2007   LEG EDEMA, BILATERAL 08/25/2007    Past Surgical History:  Procedure Laterality Date   APPENDECTOMY  1982   BREAST EXCISIONAL BIOPSY Left 02/28/2005   Atypical Ductal Hyperplasia   CARDIAC CATHETERIZATION  09-04-2004   NORMAL CORONARY ANATOMY/ NORMAL LVF/ EF 60%   CARDIOVASCULAR STRESS TEST  12-27-2010  DR [place]   ABNORMAL NUCLEAR STUDY W/ /MILD INFERIOR ISCHEMIA/ EF 69%/  CT HEART ANGIOGRAM ;  NO ACUTE FINDINGS   CRYOABLATION  05/16/2003   w/LEEP FOR ABNORMAL PAP SMEAR   CYSTOSCOPY  05/18/2012   Procedure: CYSTOSCOPY;  Surgeon: Reece Packer, MD;  Location: [place];  Service: Urology;  Laterality: N/A;  examination under anethesia   ESOPHAGOGASTRODUODENOSCOPY (EGD) WITH PROPOFOL N/A 09/03/2016   Procedure: ESOPHAGOGASTRODUODENOSCOPY (EGD) WITH PROPOFOL;  Surgeon: Doran Stabler, MD;  Location: [facility];  Service: Gastroenterology;  Laterality: N/A;   HYSTEROSCOPY WITH D & C  08-19-2007   PMB   KNEE ARTHROSCOPY W/ DEBRIDEMENT Left 03/29/2006   INTERNAL DERANGEMENT/ SEVERE DJD/ MENISCUS TEARS   LAPAROSCOPIC CHOLECYSTECTOMY  06-10-2002   LAPAROSCOPIC GASTRIC BANDING  03/01/2006   TRUNCAL VAGOTOMY/ PLACEMENT OF VG BAND   REVISION TOTAL KNEE ARTHROPLASTY Left 08-29-2008; 05/2009   TONSILLECTOMY  1969   TOTAL KNEE ARTHROPLASTY Left 01-23-2007   SEVERE DJD   TOTAL THYROIDECTOMY  11-22-2005    BILATERAL THYROID NODULES-- PAPILLARY CARCINOMA (0.5CM)/ ADENOMATOID NODULES   TRANSTHORACIC ECHOCARDIOGRAM  12-27-2010   LVSF NORMAL / EF 46-27%/ GRADE I DIASTOLIC DYSFUNCTION/ MILD MITRAL REGURG. / MILDLY DILATED LEFT ATRIUM/ MILDY INCREASED SYSTOLIC PRESSURE OF PULMONARY ARTERIES   TRANSVAGINAL SUBURETERAL TAPE/ SLING  09-28-2010   MIXED URINARY INCONTINENCE   TUBAL LIGATION  1983     OB History   No obstetric history on file.     Family History  Problem Relation Age of Onset   Diabetes Mother    Heart disease Mother    Dementia Mother    Heart disease Father    High blood pressure Father    Colon cancer Maternal Uncle        x 2   Breast cancer Other        great aunts x 5    Social History   Tobacco Use   Smoking status: Former    Packs/day: 2.50    Years: 39.00    Pack years:  97.50    Types: Cigarettes    Quit date: 10/22/2010    Years since quitting: 10.6   Smokeless tobacco: Never  Vaping Use   Vaping Use: Never used  Substance Use Topics   Alcohol use: No    Alcohol/week: 0.0 standard drinks   Drug use: No    Home Medications Prior to Admission medications   Medication Sig Start Date End Date Taking? Authorizing Provider  acetaminophen (TYLENOL) 500 MG tablet Take 1,000 mg by mouth 2 (two) times daily.    [provider]  albuterol (VENTOLIN HFA) 108 (90 Base) MCG/ACT inhaler TAKE 2 PUFFS BY MOUTH EVERY 6 HOURS AS NEEDED FOR WHEEZE OR SHORTNESS OF BREATH 04/07/19   Burns, Claudina Lick, MD  amLODipine (NORVASC) 5 MG tablet TAKE ONE TABLET BY MOUTH AT [facility] AND AT BEDTIME 05/14/21   Binnie Rail, MD  aspirin EC 81 MG tablet Take 1 tablet (81 mg total) by mouth daily. 03/10/18   Lelon Perla, MD  diclofenac (VOLTAREN) 50 MG EC tablet Take 50 mg by mouth 3 (three) times daily. 10/30/20   [provider]  furosemide (LASIX) 40 MG tablet TAKE ONE TABLET BY MOUTH EVERY MORNING 12/21/20   Binnie Rail, MD  gabapentin (NEURONTIN) 300 MG  capsule Take 300 mg by mouth 3 (three) times daily.  06/10/19   [provider]  hydrALAZINE (APRESOLINE) 100 MG tablet TAKE ONE TABLET BY MOUTH AT BREAKFAST AND AT BEDTIME 05/14/21   Binnie Rail, MD  hydrOXYzine (ATARAX/VISTARIL) 25 MG tablet Take 1 tablet (25 mg total) by mouth in the morning and at bedtime. 02/17/20   Binnie Rail, MD  Insulin Pen Needle (PEN NEEDLES) 31G X 8 MM MISC 1 Device by Does not apply route daily. 12/22/20   Binnie Rail, MD  Insulin Syringe-Needle U-100 26G X 1/2" 1 ML MISC Use daily for insulin injection as directed 10/22/19   Binnie Rail, MD  ipratropium-albuterol (DUONEB) 0.5-2.5 (3) MG/3ML SOLN Take 3 mLs by nebulization every 6 (six) hours as needed (Wheezing or dyspnea.). 06/05/20   Brand Males, MD  levothyroxine (SYNTHROID) 200 MCG tablet TAKE ONE TABLET BY MOUTH monday-saturday before breakfast and TAKE 1/2 TABLET BY MOUTH ONCE WEEKLY BEFORE BREAKFAST ON SUNDAYS 05/18/21   Binnie Rail, MD  metFORMIN (GLUCOPHAGE) 500 MG tablet Take 1 tablet (500 mg total) by mouth 2 (two) times daily with a meal. 07/05/20   Philemon Kingdom, MD  metoprolol tartrate (LOPRESSOR) 25 MG tablet TAKE ONE TABLET BY MOUTH EVERY MORNING and TAKE ONE TABLET BY MOUTH EVERYDAY AT BEDTIME 01/15/21   Lelon Perla, MD  morphine (MS CONTIN) 15 MG 12 hr tablet Take 15 mg by mouth daily.    [provider]  morphine (MS CONTIN) 30 MG 12 hr tablet Take 30 mg by mouth 2 (two) times daily. 09/12/15   [provider]  naloxone [facility]) nasal spray 4 mg/0.1 mL One spray in either nostril once for known/suspected opioid overdose. May repeat every 2-3 minutes in alternating nostril til EMS arrives 08/28/20   [provider]  ondansetron (ZOFRAN-ODT) 4 MG disintegrating tablet DISSOLVE ONE TABLET BY MOUTH EVERY 6 HOURS AS NEEDED FOR NAUSEA AND VOMITING 04/19/21   Doran Stabler, MD  PARoxetine (PAXIL) 40 MG tablet Take 1 tablet (40 mg total) by mouth  daily. 02/17/20   Binnie Rail, MD  potassium chloride SA (KLOR-CON M20) 20 MEQ tablet TAKE 2 TABLETS EVERY DAY  AS NEEDED FOR CRAMPING 02/16/20   Binnie Rail, MD  Pseudoephedrine-Guaifenesin [phone] MG TB12 Take by mouth.    [provider]  Semaglutide,0.25 or 0.5MG /DOS, (OZEMPIC, 0.25 OR 0.5 MG/DOSE,) 2 MG/1.5ML SOPN Inject 0.5 mg into the skin once a week. Begin once received through patient assistance. Continue Rybelsus 14 mg until then. 04/17/21   Philemon Kingdom, MD  simvastatin (ZOCOR) 20 MG tablet Take 1 tablet (20 mg total) by mouth daily. 03/27/21   Lelon Perla, MD  tiZANidine (ZANAFLEX) 2 MG tablet Take 2 mg by mouth as needed for muscle spasms.  05/30/15   [provider]  traZODone (DESYREL) 150 MG tablet Take by mouth at bedtime. 06/06/20   [provider]    Allergies    Gabapentin, Losartan, Aricept [donepezil hcl], Oxycodone, Sulfa antibiotics, and Sulfonamide derivatives  Review of Systems   Review of Systems  All other systems reviewed and are negative.  Physical Exam Updated Vital Signs BP 132/70   Pulse 86   Temp 99.9 F (37.7 C) (Oral)   Resp 18   SpO2 95%   Physical Exam Vitals and nursing note reviewed.  Constitutional:      General: She is not in acute distress.    Appearance: Normal appearance. She is well-developed.  HENT:     Head: Normocephalic and atraumatic.  Eyes:     Conjunctiva/sclera: Conjunctivae normal.     Pupils: Pupils are equal, round, and reactive to light.  Cardiovascular:     Rate and Rhythm: Normal rate and regular rhythm.     Heart sounds: Normal heart sounds.  Pulmonary:     Effort: Pulmonary effort is normal. No respiratory distress.     Breath sounds: Normal breath sounds.  Abdominal:     General: There is no distension.     Palpations: Abdomen is soft.     Tenderness: There is no abdominal tenderness.  Musculoskeletal:        General: No deformity. Normal range of motion.     Cervical  back: Normal range of motion and neck supple.  Skin:    General: Skin is warm and dry.  Neurological:     General: No focal deficit present.     Mental Status: She is alert and oriented to person, place, and time. Mental status is at baseline.     Cranial Nerves: No cranial nerve deficit.     Sensory: No sensory deficit.     Motor: No weakness.     Coordination: Coordination normal.    ED Results / Procedures / Treatments   Labs (all labs ordered are listed, but only abnormal results are displayed) Labs Reviewed  COMPREHENSIVE METABOLIC PANEL - Abnormal; Notable for the following components:      Result Value   Sodium 131 (*)    Chloride 94 (*)    Glucose, Bld 237 (*)    Creatinine, Ser 1.33 (*)    Calcium 8.7 (*)    GFR, Estimated 45 (*)    All other components within normal limits  CBC WITH DIFFERENTIAL/PLATELET - Abnormal; Notable for the following components:   WBC 11.9 (*)    RBC 5.21 (*)    MCH 25.1 (*)    Neutro Abs 9.8 (*)    All other components within normal limits  AMMONIA - Abnormal; Notable for the following components:   Ammonia 45 (*)    All other components within normal limits  URINALYSIS, ROUTINE W REFLEX MICROSCOPIC - Abnormal; Notable for  the following components:   Leukocytes,Ua SMALL (*)    Bacteria, UA FEW (*)    All other components within normal limits  LACTIC ACID, PLASMA - Abnormal; Notable for the following components:   Lactic Acid, Venous 2.0 (*)    All other components within normal limits  PROTIME-INR  LACTIC ACID, PLASMA  ETHANOL  RAPID URINE DRUG SCREEN, HOSP PERFORMED  TROPONIN I (HIGH SENSITIVITY)  TROPONIN I (HIGH SENSITIVITY)    EKG EKG Interpretation  Date/Time:  Wednesday May 30 2021 16:26:18 EDT Ventricular Rate:  76 PR Interval:  202 QRS Duration: 128 QT Interval:  414 QTC Calculation: 465 R Axis:   31 Text Interpretation: Sinus rhythm with Premature supraventricular complexes Right bundle branch block Abnormal  ECG Confirmed by Dene Gentry [phone]) on 05/30/2021 7:41:02 PM  Radiology CT HEAD WO CONTRAST (5MM)  Result Date: 05/30/2021 CLINICAL DATA:  Status post fall. Neck pain. feels like she is being "stabbed with an ice pick". EXAM: CT HEAD WITHOUT CONTRAST CT CERVICAL SPINE WITHOUT CONTRAST TECHNIQUE: Multidetector CT imaging of the head and cervical spine was performed following the standard protocol without intravenous contrast. Multiplanar CT image reconstructions of the cervical spine were also generated. COMPARISON:  None. FINDINGS: CT HEAD FINDINGS Brain: No evidence of large-territorial acute infarction. No parenchymal hemorrhage. No mass lesion. No extra-axial collection. No mass effect or midline shift. No hydrocephalus. Basilar cisterns are patent. Empty sella. Vascular: No hyperdense vessel. Skull: No acute fracture or focal lesion. Sinuses/Orbits: Mucosal thickening of the right maxillary sinus. Otherwise paranasal sinuses and mastoid air cells are clear. The orbits are unremarkable. Other: None. CT CERVICAL SPINE FINDINGS Alignment: Normal. Skull base and vertebrae: Multilevel degenerative changes spine most prominent at the C6-C7 level. No severe osseous neural foraminal or central canal stenosis. No acute fracture. No aggressive appearing focal osseous lesion or focal pathologic process. Soft tissues and spinal canal: No prevertebral fluid or swelling. No visible canal hematoma. Upper chest: Unremarkable. Other: Thyroid surgical changes. IMPRESSION: 1. No acute intracranial abnormality. 2. No acute displaced fracture or traumatic listhesis of the cervical spine. 3. Empty sella. Findings is often a normal anatomic variant but can be associated with idiopathic intracranial hypertension (pseudotumor cerebri). Electronically Signed   By: Iven Finn M.D.   On: 05/30/2021 18:13   CT Cervical Spine Wo Contrast  Result Date: 05/30/2021 CLINICAL DATA:  Status post fall. Neck pain. feels like she is  being "stabbed with an ice pick". EXAM: CT HEAD WITHOUT CONTRAST CT CERVICAL SPINE WITHOUT CONTRAST TECHNIQUE: Multidetector CT imaging of the head and cervical spine was performed following the standard protocol without intravenous contrast. Multiplanar CT image reconstructions of the cervical spine were also generated. COMPARISON:  None. FINDINGS: CT HEAD FINDINGS Brain: No evidence of large-territorial acute infarction. No parenchymal hemorrhage. No mass lesion. No extra-axial collection. No mass effect or midline shift. No hydrocephalus. Basilar cisterns are patent. Empty sella. Vascular: No hyperdense vessel. Skull: No acute fracture or focal lesion. Sinuses/Orbits: Mucosal thickening of the right maxillary sinus. Otherwise paranasal sinuses and mastoid air cells are clear. The orbits are unremarkable. Other: None. CT CERVICAL SPINE FINDINGS Alignment: Normal. Skull base and vertebrae: Multilevel degenerative changes spine most prominent at the C6-C7 level. No severe osseous neural foraminal or central canal stenosis. No acute fracture. No aggressive appearing focal osseous lesion or focal pathologic process. Soft tissues and spinal canal: No prevertebral fluid or swelling. No visible canal hematoma. Upper chest: Unremarkable. Other: Thyroid surgical changes. IMPRESSION: 1. No  acute intracranial abnormality. 2. No acute displaced fracture or traumatic listhesis of the cervical spine. 3. Empty sella. Findings is often a normal anatomic variant but can be associated with idiopathic intracranial hypertension (pseudotumor cerebri). Electronically Signed   By: Iven Finn M.D.   On: 05/30/2021 18:13    Procedures Procedures   Medications Ordered in ED Medications - No data to display  ED Course  I have reviewed the triage vital signs and the nursing notes.  Pertinent labs & imaging results that were available during my care of the patient were reviewed by me and considered in my medical decision  making (see chart for details).    MDM Rules/Calculators/A&P                           MDM  MSE complete  Mary Acosta was evaluated in Emergency Department on 05/30/2021 for the symptoms described in the history of present illness. She was evaluated in the context of the global COVID-19 pandemic, which necessitated consideration that the patient might be at risk for infection with the SARS-CoV-2 virus that causes COVID-19. Institutional protocols and algorithms that pertain to the evaluation of patients at risk for COVID-19 are in a state of rapid change based on information released by regulatory bodies including the CDC and federal and state organizations. These policies and algorithms were followed during the patient's care in the ED.   Patient presented with a host of complaints.  History and exam are not suggestive of acute pathology.  Screening labs obtained and screening imaging obtained today are without evidence of significant acute pathology.  Patient does feel improved after ED evaluation.  She does not understand need for close follow-up with her regular care provider.  Strict return precautions given and understood.  Final Clinical Impression(s) / ED Diagnoses Final diagnoses:  Fall, initial encounter  Nonintractable headache, unspecified chronicity pattern, unspecified headache type    Rx / DC Orders ED Discharge Orders     None        Valarie Merino, MD 05/30/21 2039

## 2021-05-30 NOTE — Telephone Encounter (Signed)
[facility]: Caller fell, hit her head, and is now seeing spots. Patient has an appointment scheduled on 10.11.22   Date/Time (Eastern Time): 05/30/2021 8:25:57 AM Confirm and document reason for call. If symptomatic, describe symptoms. ---Caller states she has fallen many times and has hit her head several times. She sees spots because she has macular degeneration, not because of the fall. She fell 6 weeks ago. She feels like an ice pick is sticking in her head. Is having memory problems. Has not fallen again, but has been unsteady and almost fell yesterday. She has been having visual hallucinations for 6 months, seeing people , sees animals, she knows  Patient to see NP at [facility] today at 2:40 pm, team health triage will be scanned into the chart

## 2021-05-30 NOTE — ED Provider Notes (Signed)
Emergency Medicine Provider Triage Evaluation Note  Mary Acosta , a 65 y.o. female  was evaluated in triage.  Pt complains of headache, neck pain, and feeling forgetful.  She tried to see her PCP today and was sent here. She tells me that she has been having hallucinations for multiple months recently.  I see a note from her PCP from July where they felt like this may be related to high ammonia levels and recommended that she get seen however I do not she where she came in. She reports feeling very forgetful with a ice pick feeling in her head and pain in both sides of her neck.  Review of Systems  Positive: Confusion, falls, headache, neck pain Negative: Fever, chest pain  Physical Exam  BP (!) 116/49 (BP Location: Left Arm)   Pulse 72   Temp 99.9 F (37.7 C) (Oral)   Resp 18   SpO2 95%  Gen:   Awake, no distress   Resp:  Normal effort  MSK:   Moves extremities without difficulty  Other:  Patient is awake and alert.  She has difficulty recalling recent events and giving me a timeline for her symptoms.  Medical Decision Making  Medically screening exam initiated at 4:34 PM.  Appropriate orders placed.  Kinleigh Nault was informed that the remainder of the evaluation will be completed by another provider, this initial triage assessment does not replace that evaluation, and the importance of remaining in the ED until their evaluation is complete.     Lorin Glass, PA-C 05/30/21 1636    Valarie Merino, MD 05/30/21 [phone]

## 2021-05-30 NOTE — Telephone Encounter (Signed)
Called the patient about her appointment today with Dr. Charmian Muff, but he is at [facility] and the patient went to [facility]  Patient was trying to locate [facility]. Informed the patient that Dr. Charmian Muff was temporarily at [facility].  Patient stated that both sides of her head hurt, and she felt like she was having a heart attacked.  Encouraged patient to go to Urgent care or the ED now.  Patient agreed to go to the [facility] now  Made a 2:40pm appointment with Jeralyn Ruths for 10.6.22

## 2021-05-30 NOTE — ED Triage Notes (Signed)
Pt reports frequent falls at home, most recent being 6 weeks ago. C/o headache that feels like she is being "stabbed with an ice pick". Also c/o neck pain and feeling forgetful.

## 2021-05-31 ENCOUNTER — Ambulatory Visit: Payer: Medicare HMO | Admitting: Nurse Practitioner

## 2021-06-04 ENCOUNTER — Encounter: Payer: Self-pay | Admitting: Internal Medicine

## 2021-06-04 ENCOUNTER — Other Ambulatory Visit: Payer: Self-pay

## 2021-06-04 ENCOUNTER — Ambulatory Visit (INDEPENDENT_AMBULATORY_CARE_PROVIDER_SITE_OTHER): Payer: Medicare HMO | Admitting: Internal Medicine

## 2021-06-04 VITALS — BP 126/74 | HR 61 | Ht 69.0 in | Wt 267.6 lb

## 2021-06-04 DIAGNOSIS — E039 Hypothyroidism, unspecified: Secondary | ICD-10-CM

## 2021-06-04 DIAGNOSIS — E1165 Type 2 diabetes mellitus with hyperglycemia: Secondary | ICD-10-CM | POA: Diagnosis not present

## 2021-06-04 DIAGNOSIS — E782 Mixed hyperlipidemia: Secondary | ICD-10-CM

## 2021-06-04 DIAGNOSIS — E1159 Type 2 diabetes mellitus with other circulatory complications: Secondary | ICD-10-CM

## 2021-06-04 LAB — POCT GLYCOSYLATED HEMOGLOBIN (HGB A1C): Hemoglobin A1C: 8.6 % — AB (ref 4.0–5.6)

## 2021-06-04 MED ORDER — METFORMIN HCL 500 MG PO TABS: 500.0000 mg | ORAL_TABLET | Freq: Two times a day (BID) | ORAL | 3 refills | Status: DC

## 2021-06-04 MED ORDER — OZEMPIC (1 MG/DOSE) 4 MG/3ML ~~LOC~~ SOPN: 1.0000 mg | PEN_INJECTOR | SUBCUTANEOUS | 3 refills | Status: DC

## 2021-06-04 MED ORDER — BASAGLAR KWIKPEN 100 UNIT/ML ~~LOC~~ SOPN: 50.0000 [IU] | PEN_INJECTOR | Freq: Every day | SUBCUTANEOUS | 3 refills | Status: DC

## 2021-06-04 NOTE — Progress Notes (Signed)
Subjective:    Patient ID: Mary Acosta, female    DOB: 1956-04-03, 65 y.o.   MRN: [id number]  This visit occurred during the SARS-CoV-2 public health emergency.  Safety protocols were in place, including screening questions prior to the visit, additional usage of staff PPE, and extensive cleaning of exam room while observing appropriate contact time as indicated for disinfecting solutions.    HPI The patient is here for an acute visit.  She fell several months ago and struck her head at that time.  She has fallen many times.  She is hitting her head a few times.  She has had intermittent headaches for a while.  She also has neck pain and has been feeling forgetful-both of these are not new.  She has had intermittent hallucinations at home - seeing people on TV who are not there.     She denies any hallucinations recently - she thinks that was related to the hydroxyzine and she has stopped it and the hallucinations stopped.     She has forgotten to take her thyroid medication many times and this has caused weight gain and her sugars go up.  She takes the thyroid medication out of her package of pills because she does not always take her blood pressure medications because her blood pressure is sometimes low, but sometimes is very high.  She is to take the thyroid medication in a different time, which is why she takes it on the package.   Takes pm BP meds 99% of the time.  She takes am BP medications 3-4 times a week.   She is eating good - not eating sweets or drinking any soda.    Her ammonia was slightly elevated in the ED.  She denies tremors.  She is no longer having hallucinations.  Sometimes is confused.  Has not seen Dr Loletha Carrow in a while.   Medications and allergies reviewed with patient and updated if appropriate.  Patient Active Problem List   Diagnosis Date Noted   Hallucinations 03/06/2021   Aortic atherosclerosis ([place]) 11/01/2020   Uncontrolled type 2 diabetes  mellitus with circulatory disorder, with long-term current use of insulin 07/05/2020   PSVT (paroxysmal supraventricular tachycardia) ([place]) 05/31/2020   UTI (urinary tract infection) 03/01/2020   Grief 02/03/2019   Nausea 01/04/2019   Reactive airway disease 10/21/2018   Arthralgia 09/04/2018   Thyroid cancer ([place]) 04/30/2018   Obesity hypoventilation syndrome ([place]) 09/24/2017   Palpitations 09/18/2017   Diabetic neuropathy ([place]) 06/04/2017   Vitamin B12 deficiency 03/05/2017   Numbness and tingling in both hands 02/24/2017   OSA (obstructive sleep apnea) 01/31/2017   Liver cirrhosis secondary to NASH ([place]) 01/31/2017   Hypothyroidism 01/31/2017   Chronic narcotic use 01/31/2017   Fibromyalgia 01/31/2017   Hyperlipidemia 01/31/2017   Gastroparesis    Memory disorder 07/25/2016   Lump in neck 01/29/2016   Chronic diastolic (congestive) heart failure ([place]) 01/03/2016   Fatty liver 01/03/2016   Type 1 diabetes mellitus ([place]) 01/03/2016   Recurrent Clostridium difficile diarrhea 01/03/2016   Chronic respiratory failure ([place]) 11/10/2014   Physical deconditioning 09/26/2014   Dyspnea 09/26/2014   Hyperglycemia 07/09/2014   Iron deficiency anemia, unspecified  04/05/2011   Bariatric surgery status 04/05/2011   Vitamin D deficiency 10/22/2007   LOW BACK PAIN, CHRONIC 10/22/2007   INSOMNIA 10/22/2007   Obesity 10/14/2007   Chronic pain syndrome 10/14/2007   CARPAL TUNNEL SYNDROME 10/14/2007   ALLERGIC RHINITIS CAUSE UNSPECIFIED 10/14/2007  ARTHRITIS 10/14/2007   Anxiety state 08/25/2007   Depression 08/25/2007   Essential hypertension 08/25/2007   ASTHMA 08/25/2007   CONSTIPATION 08/25/2007   POLYMYALGIA RHEUMATICA 08/25/2007    Current Outpatient Medications on File Prior to Visit  Medication Sig Dispense Refill   acetaminophen (TYLENOL) 500 MG tablet Take 1,000 mg by mouth 2 (two) times daily.     albuterol (VENTOLIN HFA) 108 (90 Base) MCG/ACT inhaler TAKE 2 PUFFS BY  MOUTH EVERY 6 HOURS AS NEEDED FOR WHEEZE OR SHORTNESS OF BREATH 18 g 11   amLODipine (NORVASC) 5 MG tablet TAKE ONE TABLET BY MOUTH AT BREAKFAST AND AT BEDTIME 180 tablet 0   aspirin EC 81 MG tablet Take 1 tablet (81 mg total) by mouth daily. 90 tablet 3   diclofenac (VOLTAREN) 50 MG EC tablet Take 50 mg by mouth 3 (three) times daily.     furosemide (LASIX) 40 MG tablet TAKE ONE TABLET BY MOUTH EVERY MORNING 30 tablet 5   gabapentin (NEURONTIN) 300 MG capsule Take 300 mg by mouth 3 (three) times daily.      Insulin Glargine (BASAGLAR KWIKPEN) 100 UNIT/ML Inject 50 Units into the skin at bedtime. 30 mL 3   Insulin Pen Needle (PEN NEEDLES) 31G X 8 MM MISC 1 Device by Does not apply route daily. 90 each 3   Insulin Syringe-Needle U-100 26G X 1/2" 1 ML MISC Use daily for insulin injection as directed 100 each 3   ipratropium-albuterol (DUONEB) 0.5-2.5 (3) MG/3ML SOLN Take 3 mLs by nebulization every 6 (six) hours as needed (Wheezing or dyspnea.). 360 mL 11   levothyroxine (SYNTHROID) 200 MCG tablet TAKE ONE TABLET BY MOUTH monday-saturday before breakfast and TAKE 1/2 TABLET BY MOUTH ONCE WEEKLY BEFORE BREAKFAST ON SUNDAYS 90 tablet 0   metFORMIN (GLUCOPHAGE) 500 MG tablet Take 1 tablet (500 mg total) by mouth 2 (two) times daily with a meal. 180 tablet 3   metoprolol tartrate (LOPRESSOR) 25 MG tablet TAKE ONE TABLET BY MOUTH EVERY MORNING and TAKE ONE TABLET BY MOUTH EVERYDAY AT BEDTIME 180 tablet 3   morphine (MS CONTIN) 15 MG 12 hr tablet Take 15 mg by mouth daily.     morphine (MS CONTIN) 30 MG 12 hr tablet Take 30 mg by mouth 2 (two) times daily.     naloxone (NARCAN) nasal spray 4 mg/0.1 mL One spray in either nostril once for known/suspected opioid overdose. May repeat every 2-3 minutes in alternating nostril til EMS arrives     ondansetron (ZOFRAN-ODT) 4 MG disintegrating tablet DISSOLVE ONE TABLET BY MOUTH EVERY 6 HOURS AS NEEDED FOR NAUSEA AND VOMITING 30 tablet 3   PARoxetine (PAXIL) 40 MG  tablet Take 1 tablet (40 mg total) by mouth daily. 90 tablet 1   potassium chloride SA (KLOR-CON M20) 20 MEQ tablet TAKE 2 TABLETS EVERY DAY AS NEEDED FOR CRAMPING 180 tablet 1   Pseudoephedrine-Guaifenesin [phone] MG TB12 Take by mouth.     Semaglutide, 1 MG/DOSE, (OZEMPIC, 1 MG/DOSE,) 4 MG/3ML SOPN Inject 1 mg into the skin once a week. 9 mL 3   simvastatin (ZOCOR) 20 MG tablet Take 1 tablet (20 mg total) by mouth daily. 30 tablet 6   tiZANidine (ZANAFLEX) 2 MG tablet Take 2 mg by mouth as needed for muscle spasms.   2   tiZANidine (ZANAFLEX) 4 MG tablet Take 4 mg by mouth every 8 (eight) hours as needed.     traZODone (DESYREL) 150 MG tablet Take by mouth at bedtime.  No current facility-administered medications on file prior to visit.    Past Medical History:  Diagnosis Date   AKI (acute kidney injury) ([place]) 01/2017   Anxiety    with panic attacks   Arthritis    "back; feet; hands; shoulders" (08/26/2014)   Asthma    Cervical cancer ([place])    Chronic lower back pain    Chronic narcotic use    Chronic pain syndrome    [facility]   Cirrhosis ([place])    Clostridium difficile infection 2017   COPD (chronic obstructive pulmonary disease) ([facility])    Daily headache    Depression    Diabetic neuropathy ([place]) 06/04/2017   DJD (degenerative joint disease)    Fatty liver disease, nonalcoholic    Fibromyalgia    Frequency of urination    HCAP (healthcare-associated pneumonia) 08/26/2014   History of TIA (transient ischemic attack) 11-01-2010   NO RESIDUAL   Hyperlipidemia    Hypertension    Hypothyroidism    IDDM (insulin dependent diabetes mellitus)    Insomnia    Lumbar stenosis    Memory difficulty 07/25/2016   Nocturia    OSA (obstructive sleep apnea)    NO CPAP SINCE WT LOSS   Osteoarthritis    with severe disease in knee   Pneumonia "several times"   Polymyalgia rheumatica ([place])    Scoliosis    Seasonal allergies    Thyroid cancer ([place])    Urgency of  urination    Vaginal pain S/P SLING  FEB 2012    Past Surgical History:  Procedure Laterality Date   APPENDECTOMY  1982   BREAST EXCISIONAL BIOPSY Left 02/28/2005   Atypical Ductal Hyperplasia   CARDIAC CATHETERIZATION  09-04-2004   NORMAL CORONARY ANATOMY/ NORMAL LVF/ EF 60%   CARDIOVASCULAR STRESS TEST  12-27-2010  DR [place]   ABNORMAL NUCLEAR STUDY W/ /MILD INFERIOR ISCHEMIA/ EF 69%/  CT HEART ANGIOGRAM ;  NO ACUTE FINDINGS   CRYOABLATION  05/16/2003   w/LEEP FOR ABNORMAL PAP SMEAR   CYSTOSCOPY  05/18/2012   Procedure: CYSTOSCOPY;  Surgeon: Reece Packer, MD;  Location: [facility];  Service: Urology;  Laterality: N/A;  examination under anethesia   ESOPHAGOGASTRODUODENOSCOPY (EGD) WITH PROPOFOL N/A 09/03/2016   Procedure: ESOPHAGOGASTRODUODENOSCOPY (EGD) WITH PROPOFOL;  Surgeon: Doran Stabler, MD;  Location: [facility];  Service: Gastroenterology;  Laterality: N/A;   HYSTEROSCOPY WITH D & C  08-19-2007   PMB   KNEE ARTHROSCOPY W/ DEBRIDEMENT Left 03/29/2006   INTERNAL DERANGEMENT/ SEVERE DJD/ MENISCUS TEARS   LAPAROSCOPIC CHOLECYSTECTOMY  06-10-2002   LAPAROSCOPIC GASTRIC BANDING  03/01/2006   TRUNCAL VAGOTOMY/ PLACEMENT OF VG BAND   REVISION TOTAL KNEE ARTHROPLASTY Left 08-29-2008; 05/2009   TONSILLECTOMY  1969   TOTAL KNEE ARTHROPLASTY Left 01-23-2007   SEVERE DJD   TOTAL THYROIDECTOMY  11-22-2005   BILATERAL THYROID NODULES-- PAPILLARY CARCINOMA (0.5CM)/ ADENOMATOID NODULES   TRANSTHORACIC ECHOCARDIOGRAM  12-27-2010   LVSF NORMAL / EF 78-29%/ GRADE I DIASTOLIC DYSFUNCTION/ MILD MITRAL REGURG. / MILDLY DILATED LEFT ATRIUM/ MILDY INCREASED SYSTOLIC PRESSURE OF PULMONARY ARTERIES   TRANSVAGINAL SUBURETERAL TAPE/ SLING  09-28-2010   MIXED URINARY INCONTINENCE   TUBAL LIGATION  1983    Social History   Socioeconomic History   Marital status: Married    Spouse name: Not on file   Number of children: 2   Years of education: 12   Highest education  level: Not on file  Occupational History  Occupation: disabled    Fish farm manager: UNEMPLOYED  Tobacco Use   Smoking status: Former    Packs/day: 2.50    Years: 39.00    Pack years: 97.50    Types: Cigarettes    Quit date: 10/22/2010    Years since quitting: 10.6   Smokeless tobacco: Never  Vaping Use   Vaping Use: Never used  Substance and Sexual Activity   Alcohol use: No    Alcohol/week: 0.0 standard drinks   Drug use: No   Sexual activity: Not Currently  Other Topics Concern   Not on file  Social History Narrative   Lives at home w/ her husband    Right-handed   Caffeine: 1 cup of coffee per week + [employer]   Social Determinants of Health   Financial Resource Strain: Medium Risk   Difficulty of Paying Living Expenses: Somewhat hard  Food Insecurity: No Food Insecurity   Worried About Charity fundraiser in the Last Year: Never true   Ran Out of Food in the Last Year: Never true  Transportation Needs: No Transportation Needs   Lack of Transportation (Medical): No   Lack of Transportation (Non-Medical): No  Physical Activity: Inactive   Days of Exercise per Week: 0 days   Minutes of Exercise per Session: 0 min  Stress: Stress Concern Present   Feeling of Stress : To some extent  Social Connections: Moderately Integrated   Frequency of Communication with Friends and Family: More than three times a week   Frequency of Social Gatherings with Friends and Family: More than three times a week   Attends Religious Services: More than 4 times per year   Active Member of Clubs or Organizations: No   Attends Music therapist: More than 4 times per year   Marital Status: Widowed    Family History  Problem Relation Age of Onset   Diabetes Mother    Heart disease Mother    Dementia Mother    Heart disease Father    High blood pressure Father    Colon cancer Maternal Uncle        x 2   Breast cancer Other        great aunts x 5    Review of Systems   Constitutional:  Negative for chills and fever.  Respiratory:  Positive for shortness of breath.   Cardiovascular:  Negative for chest pain.  Gastrointestinal:  Negative for abdominal pain.  Neurological:  Positive for light-headedness and headaches.  Psychiatric/Behavioral:  Negative for hallucinations (none since stopped hydroxyzine).       Objective:   Vitals:   06/05/21 1527  BP: 112/76  Pulse: 70  Temp: 98.7 F (37.1 C)  SpO2: 96%   BP Readings from Last 3 Encounters:  06/05/21 112/76  06/04/21 126/74  05/30/21 (!) 115/53   Wt Readings from Last 3 Encounters:  06/05/21 265 lb (120.2 kg)  06/04/21 267 lb 9.6 oz (121.4 kg)  05/30/21 265 lb (120.2 kg)   Body mass index is 39.13 kg/m.   Physical Exam    Constitutional: Appears well-developed and well-nourished. No distress.  Head: Normocephalic and atraumatic.  Neck: Neck supple. No tracheal deviation present. No thyromegaly present.  No cervical lymphadenopathy Cardiovascular: Normal rate, regular rhythm and normal heart sounds.  No murmur heard. No carotid bruit .  No edema Pulmonary/Chest: Effort normal and breath sounds normal. No respiratory distress. No has no wheezes. No rales. Abdomen: Obese, soft, nontender Skin: Skin is warm and  dry. Not diaphoretic.  Psychiatric: Normal mood and affect. Behavior is normal.       Lab Results  Component Value Date   WBC 11.9 (H) 05/30/2021   HGB 13.1 05/30/2021   HCT 43.4 05/30/2021   PLT 176 05/30/2021   GLUCOSE 237 (H) 05/30/2021   CHOL 108 11/01/2020   TRIG 141.0 11/01/2020   HDL 30.10 (L) 11/01/2020   LDLCALC 49 11/01/2020   ALT 18 05/30/2021   AST 23 05/30/2021   NA 131 (L) 05/30/2021   K 4.0 05/30/2021   CL 94 (L) 05/30/2021   CREATININE 1.33 (H) 05/30/2021   BUN 17 05/30/2021   CO2 26 05/30/2021   TSH 0.51 02/02/2021   INR 1.1 05/30/2021   HGBA1C 8.6 (A) 06/04/2021   MICROALBUR <0.7 09/19/2017    CT Cervical Spine Wo Contrast CLINICAL DATA:   Status post fall. Neck pain. feels like she is being "stabbed with an ice pick".  EXAM: CT HEAD WITHOUT CONTRAST  CT CERVICAL SPINE WITHOUT CONTRAST  TECHNIQUE: Multidetector CT imaging of the head and cervical spine was performed following the standard protocol without intravenous contrast. Multiplanar CT image reconstructions of the cervical spine were also generated.  COMPARISON:  None.  FINDINGS: CT HEAD FINDINGS  Brain:  No evidence of large-territorial acute infarction. No parenchymal hemorrhage. No mass lesion. No extra-axial collection.  No mass effect or midline shift. No hydrocephalus. Basilar cisterns are patent.  Empty sella.  Vascular: No hyperdense vessel.  Skull: No acute fracture or focal lesion.  Sinuses/Orbits: Mucosal thickening of the right maxillary sinus. Otherwise paranasal sinuses and mastoid air cells are clear. The orbits are unremarkable.  Other: None.  CT CERVICAL SPINE FINDINGS  Alignment: Normal.  Skull base and vertebrae: Multilevel degenerative changes spine most prominent at the C6-C7 level. No severe osseous neural foraminal or central canal stenosis. No acute fracture. No aggressive appearing focal osseous lesion or focal pathologic process.  Soft tissues and spinal canal: No prevertebral fluid or swelling. No visible canal hematoma.  Upper chest: Unremarkable.  Other: Thyroid surgical changes.  IMPRESSION: 1. No acute intracranial abnormality. 2. No acute displaced fracture or traumatic listhesis of the cervical spine. 3. Empty sella. Findings is often a normal anatomic variant but can be associated with idiopathic intracranial hypertension (pseudotumor cerebri).  Electronically Signed   By: Iven Finn M.D.   On: 05/30/2021 18:13 CT HEAD WO CONTRAST (5MM) CLINICAL DATA:  Status post fall. Neck pain. feels like she is being "stabbed with an ice pick".  EXAM: CT HEAD WITHOUT CONTRAST  CT CERVICAL SPINE  WITHOUT CONTRAST  TECHNIQUE: Multidetector CT imaging of the head and cervical spine was performed following the standard protocol without intravenous contrast. Multiplanar CT image reconstructions of the cervical spine were also generated.  COMPARISON:  None.  FINDINGS: CT HEAD FINDINGS  Brain:  No evidence of large-territorial acute infarction. No parenchymal hemorrhage. No mass lesion. No extra-axial collection.  No mass effect or midline shift. No hydrocephalus. Basilar cisterns are patent.  Empty sella.  Vascular: No hyperdense vessel.  Skull: No acute fracture or focal lesion.  Sinuses/Orbits: Mucosal thickening of the right maxillary sinus. Otherwise paranasal sinuses and mastoid air cells are clear. The orbits are unremarkable.  Other: None.  CT CERVICAL SPINE FINDINGS  Alignment: Normal.  Skull base and vertebrae: Multilevel degenerative changes spine most prominent at the C6-C7 level. No severe osseous neural foraminal or central canal stenosis. No acute fracture. No aggressive appearing focal osseous lesion or  focal pathologic process.  Soft tissues and spinal canal: No prevertebral fluid or swelling. No visible canal hematoma.  Upper chest: Unremarkable.  Other: Thyroid surgical changes.  IMPRESSION: 1. No acute intracranial abnormality. 2. No acute displaced fracture or traumatic listhesis of the cervical spine. 3. Empty sella. Findings is often a normal anatomic variant but can be associated with idiopathic intracranial hypertension (pseudotumor cerebri).  Electronically Signed   By: Iven Finn M.D.   On: 05/30/2021 18:13   Assessment & Plan:   Flu vaccine today  See Problem List for Assessment and Plan of chronic medical problems.

## 2021-06-04 NOTE — Progress Notes (Signed)
Patient ID: Mary Acosta, female   DOB: 01-01-1956, 65 y.o.   MRN: [id number]   This visit occurred during the SARS-CoV-2 public health emergency.  Safety protocols were in place, including screening questions prior to the visit, additional usage of staff PPE, and extensive cleaning of exam room while observing appropriate contact time as indicated for disinfecting solutions.    HPI: Mary Acosta is a 65 y.o.-year-old female, initially referred by her PCP, Dr. Quay Burow, returning for follow-up for DM2, dx in 2000, insulin-dependent since 2011, uncontrolled, with long-term complications (peripheral neuropathy, gastroparesis, cerebrovascular disease with history of TIA 2012, DKA 2015) and also history of thyroid cancer and postsurgical hypothyroidism.  She previously saw my colleague, Dr. Loanne Drilling, up to 07/2019.  Last visit with me 4.5 mo ago.  Interim hx: She has increased urination but no chest pain or blurry vision.  She has occasional nausea. She lost 2 sons: 2020 and 09/2020.  At last visit, she was grieving.  She was seeing psychiatry and taking Paxil. Sugar increased recently >> up to 600s x1. She continues to fall.  She uses a walker.  DM2: Reviewed HbA1c levels: Lab Results  Component Value Date   HGBA1C 6.9 (A) 01/15/2021   HGBA1C 7.6 (A) 10/13/2020   HGBA1C 8.1 (A) 07/05/2020   HGBA1C 10.4 (H) 05/03/2020   HGBA1C 11.0 (H) 10/22/2019   HGBA1C 9.4 (H) 07/20/2019   HGBA1C 8.8 (H) 04/01/2019   HGBA1C 10.5 (A) 10/08/2018   HGBA1C 10.8 (H) 09/04/2018   HGBA1C 9.9 (H) 06/01/2018   She was previously on: -  [employer] >> stopped 1 mo prior to our appt in 12/2020 >> now once a week ~50 units - Metformin 1000 mg with dinner >> 500 mg 2x a day - Rybelsus 14 mg before b'fast  >> Ozempic 0.5 mg weekly She did not start Ozempic yet as advised 07/2020. Prev. On Rybelsus 3 mg before breakfast-see patient assistance pgm  She was on N and R insulin in the past. She was on  metformin in the past >> did not work.  Pt checks her sugars 2-3 times a day: - am:  75, 200-225 >> 125-225 >> 110-125 >> 170-190 - 2h after b'fast: n/c - before lunch: n/c - 2h after lunch: n/c - before dinner:  200-225 >> 150-170 >> 130-150 >> 220 - 2h after dinner: n/c - bedtime: n/c >> 200s >> 130s >> 250-260 - nighttime: n/c Lowest sugar was 75 >> 110 >> 170; she has hypoglycemia awareness in the 70s. Highest sugar was 600... >> 200 >> 600.  Glucometer: AccuChek, Prodigy  Pt's meals are: - Breakfast: bacon or sausage and eggs, cereal  - stopped - Lunch: salad; hot dog - Dinner: meat + veggies - Snacks: not so much anymore Previously drinking Kool-Aid, now stopped  She is status post gastric banding with vagotomy in 2008.  She lost 148 pounds afterwards but then gained more than 100 pounds back.  No h/o pancreatitis or FH of MTC.  No CKD, last BUN/creatinine:  Lab Results  Component Value Date   BUN 17 05/30/2021   BUN 13 11/01/2020   CREATININE 1.33 (H) 05/30/2021   CREATININE 0.98 11/01/2020  Not on ACE inhibitor/ARB.  + HL; last set of lipids: Lab Results  Component Value Date   CHOL 108 11/01/2020   HDL 30.10 (L) 11/01/2020   LDLCALC 49 11/01/2020   TRIG 141.0 11/01/2020   CHOLHDL 4 11/01/2020  On Zocor 20, Zetia 10.  -  last eye exam was in 09/2020 reportedly: no DR. Stopped IO injections for macular degeneration.  -She has pain, numbness, tingling in her feet.  On ASA 81.  Pt has FH of DM in mother.  Thyroid cancer:  Reviewed her thyroid cancer history: She is status post total thyroidectomy - [place] Sx 2007. 11/22/2005: THYROID, THYROIDECTOMY:   - PAPILLARY THYROID CARCINOMA (0.5 CM).   - ADENOMATOID NODULES.    COMMENT   ONCOLOGY TABLE-THYROID    1. Maximum tumor size (cm): 0.5 cm   2. Tumor location: Right mid lobe   3. Multifocality: Not identified   4. Histology: Papillary thyroid carcinoma   5. Margins: Negative   6.  Capsular invasion: Present   7. Extrathyroidal extension: Not identified   8. Vascular/Lymphatic invasion: Not identified   9. Lymph nodes: N/A   10. TNM code: pT1, pNX, pMX   11. Comments: Examination of the thyroidectomy specimen   grossly demonstrates multiple nodules. Histologic evaluation   demonstrates numerous adenomatoid nodules. One nodule measuring   0.5 cm demonstrates features compatible with papillary thyroid   carcinoma. The surgical margins are negative. (MS:jy) 11/25/05   Neck U/S (10/25/2020): no abnormal neck masses  Postsurgical hypothyroidism:  She is currently on 200 mcg 6/7 days and 100 mcg 1/7 days: Missed few doses! - in am - fasting - at least 30 min from b'fast - no calcium - no iron - no multivitamins - no PPIs - not on Biotin  Reviewed her TFTs: Lab Results  Component Value Date   TSH 0.51 02/02/2021   TSH 0.14 (L) 08/04/2020   TSH 4.42 05/03/2020   TSH 1.50 10/22/2019   TSH 1.09 07/20/2019   TSH 1.32 04/01/2019   TSH 0.55 09/04/2018   TSH 0.90 12/26/2017   TSH 0.113 (L) 12/08/2017   TSH 0.22 (L) 09/19/2017   TSH 0.62 06/13/2017   TSH 1.43 01/22/2017   TSH 6.67 (H) 12/10/2016   TSH 1.12 01/29/2016   TSH 3.320 08/28/2014   Pt denies: - feeling nodules in neck - hoarseness - dysphagia - choking - SOB with lying down  Not on steroids or biotin.  She also has a history of COPD, OSA, fibromyalgia, depression, fatty liver-cirrhosis, DDD, h/o vb fx, PMR.  She is seen in pain clinic in [facility].  She is on Neurontin and amitriptyline.   ROS: Musculoskeletal:  + muscle and joint aches Skin: no rashes, no hair loss Neurological: + tremors/no numbness/no tingling/no dizziness  I reviewed pt's medications, allergies, PMH, social hx, family hx, and changes were documented in the history of present illness. Otherwise, unchanged from my initial visit note.  Past Medical History:  Diagnosis Date   AKI (acute kidney injury) ([place]) 01/2017    Anxiety    with panic attacks   Arthritis    "back; feet; hands; shoulders" (08/26/2014)   Asthma    Cervical cancer ([place])    Chronic lower back pain    Chronic narcotic use    Chronic pain syndrome    [facility]   Cirrhosis ([place])    Clostridium difficile infection 2017   COPD (chronic obstructive pulmonary disease) ([place])    Daily headache    Depression    Diabetic neuropathy ([place]) 06/04/2017   DJD (degenerative joint disease)    Fatty liver disease, nonalcoholic    Fibromyalgia    Frequency of urination    HCAP (healthcare-associated pneumonia) 08/26/2014   History of TIA (transient ischemic attack) 11-01-2010  NO RESIDUAL   Hyperlipidemia    Hypertension    Hypothyroidism    IDDM (insulin dependent diabetes mellitus)    Insomnia    Lumbar stenosis    Memory difficulty 07/25/2016   Nocturia    OSA (obstructive sleep apnea)    NO CPAP SINCE WT LOSS   Osteoarthritis    with severe disease in knee   Pneumonia "several times"   Polymyalgia rheumatica (HCC)    Scoliosis    Seasonal allergies    Thyroid cancer ([place])    Urgency of urination    Vaginal pain S/P SLING  FEB 2012   Past Surgical History:  Procedure Laterality Date   APPENDECTOMY  1982   BREAST EXCISIONAL BIOPSY Left 02/28/2005   Atypical Ductal Hyperplasia   CARDIAC CATHETERIZATION  09-04-2004   NORMAL CORONARY ANATOMY/ NORMAL LVF/ EF 60%   CARDIOVASCULAR STRESS TEST  12-27-2010  DR [place]   ABNORMAL NUCLEAR STUDY W/ /MILD INFERIOR ISCHEMIA/ EF 69%/  CT HEART ANGIOGRAM ;  NO ACUTE FINDINGS   CRYOABLATION  05/16/2003   w/LEEP FOR ABNORMAL PAP SMEAR   CYSTOSCOPY  05/18/2012   Procedure: CYSTOSCOPY;  Surgeon: Reece Packer, MD;  Location: [facility];  Service: Urology;  Laterality: N/A;  examination under anethesia   ESOPHAGOGASTRODUODENOSCOPY (EGD) WITH PROPOFOL N/A 09/03/2016   Procedure: ESOPHAGOGASTRODUODENOSCOPY (EGD) WITH PROPOFOL;  Surgeon: Doran Stabler, MD;   Location: [facility];  Service: Gastroenterology;  Laterality: N/A;   HYSTEROSCOPY WITH D & C  08-19-2007   PMB   KNEE ARTHROSCOPY W/ DEBRIDEMENT Left 03/29/2006   INTERNAL DERANGEMENT/ SEVERE DJD/ MENISCUS TEARS   LAPAROSCOPIC CHOLECYSTECTOMY  06-10-2002   LAPAROSCOPIC GASTRIC BANDING  03/01/2006   TRUNCAL VAGOTOMY/ PLACEMENT OF VG BAND   REVISION TOTAL KNEE ARTHROPLASTY Left 08-29-2008; 05/2009   TONSILLECTOMY  1969   TOTAL KNEE ARTHROPLASTY Left 01-23-2007   SEVERE DJD   TOTAL THYROIDECTOMY  11-22-2005   BILATERAL THYROID NODULES-- PAPILLARY CARCINOMA (0.5CM)/ ADENOMATOID NODULES   TRANSTHORACIC ECHOCARDIOGRAM  12-27-2010   LVSF NORMAL / EF 77-41%/ GRADE I DIASTOLIC DYSFUNCTION/ MILD MITRAL REGURG. / MILDLY DILATED LEFT ATRIUM/ MILDY INCREASED SYSTOLIC PRESSURE OF PULMONARY ARTERIES   TRANSVAGINAL SUBURETERAL TAPE/ SLING  09-28-2010   MIXED URINARY INCONTINENCE   TUBAL LIGATION  1983   Social History   Socioeconomic History   Marital status: Married    Spouse name: Not on file   Number of children: 2   Years of education: 12   Highest education level: Not on file  Occupational History   Occupation: disabled    Employer: UNEMPLOYED  Tobacco Use   Smoking status: Former    Packs/day: 2.50    Years: 39.00    Pack years: 97.50    Types: Cigarettes    Quit date: 10/22/2010    Years since quitting: 10.6   Smokeless tobacco: Never  Vaping Use   Vaping Use: Never used  Substance and Sexual Activity   Alcohol use: No    Alcohol/week: 0.0 standard drinks   Drug use: No   Sexual activity: Not Currently  Other Topics Concern   Not on file  Social History Narrative   Lives at home w/ her husband    Right-handed   Caffeine: 1 cup of coffee per week + [employer]eps[employer]   Social Determinants of Health   Financial Resource Strain: Medium Risk   Difficulty of Paying Living Expenses: Somewhat hard  Food Insecurity: No Food Insecurity   Worried About Running  Out of Food in the Last Year:  Never true   Ran Out of Food in the Last Year: Never true  Transportation Needs: No Transportation Needs   Lack of Transportation (Medical): No   Lack of Transportation (Non-Medical): No  Physical Activity: Inactive   Days of Exercise per Week: 0 days   Minutes of Exercise per Session: 0 min  Stress: Stress Concern Present   Feeling of Stress : To some extent  Social Connections: Moderately Integrated   Frequency of Communication with Friends and Family: More than three times a week   Frequency of Social Gatherings with Friends and Family: More than three times a week   Attends Religious Services: More than 4 times per year   Active Member of Clubs or Organizations: No   Attends Music therapist: More than 4 times per year   Marital Status: Widowed  Human resources officer Violence: Not At Risk   Fear of Current or Ex-Partner: No   Emotionally Abused: No   Physically Abused: No   Sexually Abused: No   Current Outpatient Medications on File Prior to Visit  Medication Sig Dispense Refill   acetaminophen (TYLENOL) 500 MG tablet Take 1,000 mg by mouth 2 (two) times daily.     albuterol (VENTOLIN HFA) 108 (90 Base) MCG/ACT inhaler TAKE 2 PUFFS BY MOUTH EVERY 6 HOURS AS NEEDED FOR WHEEZE OR SHORTNESS OF BREATH 18 g 11   amLODipine (NORVASC) 5 MG tablet TAKE ONE TABLET BY MOUTH AT BREAKFAST AND AT BEDTIME 180 tablet 0   aspirin EC 81 MG tablet Take 1 tablet (81 mg total) by mouth daily. 90 tablet 3   diclofenac (VOLTAREN) 50 MG EC tablet Take 50 mg by mouth 3 (three) times daily.     furosemide (LASIX) 40 MG tablet TAKE ONE TABLET BY MOUTH EVERY MORNING 30 tablet 5   gabapentin (NEURONTIN) 300 MG capsule Take 300 mg by mouth 3 (three) times daily.      hydrALAZINE (APRESOLINE) 100 MG tablet TAKE ONE TABLET BY MOUTH AT BREAKFAST AND AT BEDTIME 180 tablet 0   hydrOXYzine (ATARAX/VISTARIL) 25 MG tablet Take 1 tablet (25 mg total) by mouth in the morning and at bedtime. 180 tablet 1    Insulin Pen Needle (PEN NEEDLES) 31G X 8 MM MISC 1 Device by Does not apply route daily. 90 each 3   Insulin Syringe-Needle U-100 26G X 1/2" 1 ML MISC Use daily for insulin injection as directed 100 each 3   ipratropium-albuterol (DUONEB) 0.5-2.5 (3) MG/3ML SOLN Take 3 mLs by nebulization every 6 (six) hours as needed (Wheezing or dyspnea.). 360 mL 11   levothyroxine (SYNTHROID) 200 MCG tablet TAKE ONE TABLET BY MOUTH monday-saturday before breakfast and TAKE 1/2 TABLET BY MOUTH ONCE WEEKLY BEFORE BREAKFAST ON SUNDAYS 90 tablet 0   metFORMIN (GLUCOPHAGE) 500 MG tablet Take 1 tablet (500 mg total) by mouth 2 (two) times daily with a meal. 180 tablet 3   metoprolol tartrate (LOPRESSOR) 25 MG tablet TAKE ONE TABLET BY MOUTH EVERY MORNING and TAKE ONE TABLET BY MOUTH EVERYDAY AT BEDTIME 180 tablet 3   morphine (MS CONTIN) 15 MG 12 hr tablet Take 15 mg by mouth daily.     morphine (MS CONTIN) 30 MG 12 hr tablet Take 30 mg by mouth 2 (two) times daily.     naloxone (NARCAN) nasal spray 4 mg/0.1 mL One spray in either nostril once for known/suspected opioid overdose. May repeat every 2-3 minutes in alternating nostril  til EMS arrives     ondansetron (ZOFRAN-ODT) 4 MG disintegrating tablet DISSOLVE ONE TABLET BY MOUTH EVERY 6 HOURS AS NEEDED FOR NAUSEA AND VOMITING 30 tablet 3   PARoxetine (PAXIL) 40 MG tablet Take 1 tablet (40 mg total) by mouth daily. 90 tablet 1   potassium chloride SA (KLOR-CON M20) 20 MEQ tablet TAKE 2 TABLETS EVERY DAY AS NEEDED FOR CRAMPING 180 tablet 1   Pseudoephedrine-Guaifenesin [phone] MG TB12 Take by mouth.     Semaglutide,0.25 or 0.5MG /DOS, (OZEMPIC, 0.25 OR 0.5 MG/DOSE,) 2 MG/1.5ML SOPN Inject 0.5 mg into the skin once a week. Begin once received through patient assistance. Continue Rybelsus 14 mg until then. 3 mL 0   simvastatin (ZOCOR) 20 MG tablet Take 1 tablet (20 mg total) by mouth daily. 30 tablet 6   tiZANidine (ZANAFLEX) 2 MG tablet Take 2 mg by mouth as needed for  muscle spasms.   2   traZODone (DESYREL) 150 MG tablet Take by mouth at bedtime.     No current facility-administered medications on file prior to visit.   Allergies  Allergen Reactions   Gabapentin Swelling    Swelling in legs Swelling in legs Swelling in legs   Losartan Other (See Comments)    Myalgias and muscle cramping Other reaction(s): Other (See Comments) Myalgias and muscle cramping Myalgias and muscle cramping Other reaction(s): Other (See Comments) Myalgias and muscle cramping   Aricept [Donepezil Hcl]     Nausea/vomiting, low BP   Oxycodone Itching   Sulfa Antibiotics Nausea Only and Rash   Sulfonamide Derivatives Nausea Only   Family History  Problem Relation Age of Onset   Diabetes Mother    Heart disease Mother    Dementia Mother    Heart disease Father    High blood pressure Father    Colon cancer Maternal Uncle        x 2   Breast cancer Other        great aunts x 5    PE: BP 126/74 (BP Location: Right Arm, Patient Position: Sitting, Cuff Size: Normal)   Pulse 61   Ht 5\' 9"  (1.753 m)   Wt 267 lb 9.6 oz (121.4 kg)   SpO2 92%   BMI 39.52 kg/m  Wt Readings from Last 3 Encounters:  06/04/21 267 lb 9.6 oz (121.4 kg)  05/30/21 265 lb (120.2 kg)  01/15/21 260 lb 9.6 oz (118.2 kg)   Constitutional: overweight, in NAD Eyes: PERRLA, EOMI, no exophthalmos ENT: moist mucous membranes, no neck masses, no cervical lymphadenopathy Cardiovascular: RRR, No MRG Respiratory: CTA B Gastrointestinal: abdomen soft, NT, ND, BS+ Musculoskeletal: no deformities, strength intact in all 4 Skin: moist, warm, no rashes Neurological: no tremor with outstretched hands, DTR normal in all 4  ASSESSMENT: 1. DM2, insulin-dependent, uncontrolled, with complications  2.  History of thyroid cancer  3.  Postsurgical hypothyroidism  PLAN:  1. Patient with longstanding, uncontrolled, type 2 diabetes, on metformin low-dose (due to cirrhosis) and now weekly GLP-1 receptor  agonist, with much improved control after starting Ozempic -At last visit, sugars were much better, only slightly above target before dinner . At that time, we discussed about continuing w/o insulin, which she previously stopped 1 months prior to our last appointment. HbA1c then was at goal, at 6.9%, lower. -At today's visit, sugars are much higher.  I believe that this is due to being off insulin.  As of now she tells me she is taking higher doses of insulin, usually 50  units, but occasionally even higher, up to 130 if sugars are high at that time.  However, she ends up taking insulin only approximately once a week.  I explained that this insulin is not usually used as needed.  I suggested to start it back at 50 units every night.  Since her sugars increase throughout the day in a stepwise fashion, I also advised her to increase the Ozempic dose to 1 mg weekly.  She does have some nausea, but this is longstanding and she does not think that this is related to Ozempic. - I suggested to:  Patient Instructions  Please continue: - Metformin 500 mg 2x a day  Please use:  - Basaglar 50 units at bedtime  Please increase: - Ozempic 1 mg weekly in a.m.   Please continue Levothyroxine 200 mcg 6/7 days and 100 mcg 1/7 days.  Take the thyroid hormone every day, with water, at least 30 minutes before breakfast, separated by at least 4 hours from: - acid reflux medications - calcium - iron - multivitamins  Please return in 3 months with your sugar log.     - we checked her HbA1c: 8.6% (much higher) - advised to check sugars at different times of the day - 1x a day, rotating check times - advised for yearly eye exams >> she is UTD - return to clinic in 3 months  2.  History of thyroid cancer -She had a papillary thyroid cancer focus in the right thyroid lobe, measuring 0.5 cm.  No vascular, lymphatic or direct extension into the surrounding tissues -Denies neck compression symptoms -Before last  visit we checked another neck ultrasound that did not show any suspicious masses -She most likely achieved cure of her thyroid cancer  3.  Postsurgical hypothyroidism - - latest thyroid labs reviewed with pt. >> normal: Lab Results  Component Value Date   TSH 0.51 02/02/2021  - she continues on LT4 200 mcg 6 out of 7 days and 100 mcg 1 out of 7 days - pt feels good on this dose. - we discussed about taking the thyroid hormone every day, with water, >30 minutes before breakfast, separated by >4 hours from acid reflux medications, calcium, iron, multivitamins. Pt. is taking it correctly but occasionally missing doses.  I strongly advised her to try her best not to miss this. - will check thyroid tests at next visit  Philemon Kingdom, MD PhD [facility]

## 2021-06-04 NOTE — Patient Instructions (Addendum)
Please continue: - Metformin 500 mg 2x a day  Please use:  - Basaglar 50 units at bedtime  Please increase: - Ozempic 1 mg weekly in a.m.   Please continue Levothyroxine 200 mcg 6/7 days and 100 mcg 1/7 days.  Take the thyroid hormone every day, with water, at least 30 minutes before breakfast, separated by at least 4 hours from: - acid reflux medications - calcium - iron - multivitamins  Please return in 3 months with your sugar log.

## 2021-06-05 ENCOUNTER — Ambulatory Visit (INDEPENDENT_AMBULATORY_CARE_PROVIDER_SITE_OTHER): Payer: Medicare HMO | Admitting: Internal Medicine

## 2021-06-05 ENCOUNTER — Encounter: Payer: Self-pay | Admitting: Internal Medicine

## 2021-06-05 VITALS — BP 112/76 | HR 70 | Temp 98.7°F | Ht 69.0 in | Wt 265.0 lb

## 2021-06-05 DIAGNOSIS — Z23 Encounter for immunization: Secondary | ICD-10-CM | POA: Diagnosis not present

## 2021-06-05 DIAGNOSIS — E890 Postprocedural hypothyroidism: Secondary | ICD-10-CM

## 2021-06-05 DIAGNOSIS — I10 Essential (primary) hypertension: Secondary | ICD-10-CM | POA: Diagnosis not present

## 2021-06-05 DIAGNOSIS — K7460 Unspecified cirrhosis of liver: Secondary | ICD-10-CM

## 2021-06-05 DIAGNOSIS — K7581 Nonalcoholic steatohepatitis (NASH): Secondary | ICD-10-CM | POA: Diagnosis not present

## 2021-06-05 MED ORDER — HYDRALAZINE HCL 25 MG PO TABS: 25.0000 mg | ORAL_TABLET | Freq: Three times a day (TID) | ORAL | 5 refills | Status: DC | PRN

## 2021-06-05 NOTE — Assessment & Plan Note (Signed)
Chronic Has not been taking her medication on a regular basis and stressed that she needs to do so.  She is making more of an effort to do that, but sometimes forgets Will recheck blood work in a month when she comes for follow-up

## 2021-06-05 NOTE — Patient Instructions (Addendum)
   Flu immunization administered today.      Call Dr Loletha Carrow for a follow up appointment   Phone: [phone]   Medications changes include :   stop hydralazine 100 mg three times a day --- take hydralazine 25 mg three times a day as needed for elevated BP (> 150/90)  Your prescription(s) have been submitted to your pharmacy. Please take as directed and contact our office if you believe you are having problem(s) with the medication(s).     Please followup in 4-6 weeks

## 2021-06-05 NOTE — Assessment & Plan Note (Signed)
Chronic Has not seen Dr. Loletha Carrow in a while-stressed follow-up Ammonia recently slightly elevated, but I do not think she is having any symptoms-hallucinations were caused from hydroxyzine and have stopped since discontinuing the medication.  She denies tremors.  At times she does have some confusion/memory issues, but this is not new and likely multifactorial Will schedule follow-up with Dr. Loletha Carrow

## 2021-06-05 NOTE — Assessment & Plan Note (Signed)
Chronic Blood pressure well controlled here today-she states she did not take her medication this morning. Blood pressure variable and she has been holding her medications when her blood pressure is too low and then at times her blood pressure is very high Will discontinue hydralazine 100 mg 3 times daily Advised her to continue and take the metoprolol 25 mg twice daily and amlodipine 5 mg twice daily Start hydralazine 25 mg 3 times daily as needed for BP 150/90 or higher Depending on how often she is taking the hydralazine we will start hydralazine at a low dose 3 times daily if needed Advised her to keep a blood pressure log appointment with her for next appointment

## 2021-06-06 DIAGNOSIS — F419 Anxiety disorder, unspecified: Secondary | ICD-10-CM | POA: Diagnosis not present

## 2021-06-06 DIAGNOSIS — R69 Illness, unspecified: Secondary | ICD-10-CM | POA: Diagnosis not present

## 2021-06-08 NOTE — Addendum Note (Signed)
Addended by: Marcina Millard on: 06/08/2021 01:54 PM   Modules accepted: Orders

## 2021-06-11 DIAGNOSIS — J449 Chronic obstructive pulmonary disease, unspecified: Secondary | ICD-10-CM | POA: Diagnosis not present

## 2021-06-13 ENCOUNTER — Other Ambulatory Visit: Payer: Self-pay | Admitting: Internal Medicine

## 2021-06-13 DIAGNOSIS — G4733 Obstructive sleep apnea (adult) (pediatric): Secondary | ICD-10-CM

## 2021-06-13 DIAGNOSIS — J449 Chronic obstructive pulmonary disease, unspecified: Secondary | ICD-10-CM | POA: Diagnosis not present

## 2021-06-13 DIAGNOSIS — I10 Essential (primary) hypertension: Secondary | ICD-10-CM

## 2021-06-18 ENCOUNTER — Telehealth: Payer: Self-pay | Admitting: Pharmacist

## 2021-06-18 NOTE — Progress Notes (Addendum)
Chronic Care Management Pharmacy Assistant   Name: Vanna Sailer  MRN: [id number] DOB: [birth year]  .  Reason for Encounter: Medication Review    Recent office visits:  06/05/21 Binnie Rail, MD-PCP (Flu vaccine): decrease hydralazine to 25 mg TID  Recent consult visits:  06/04/21 Philemon Kingdom, MD-Internal Medicine (Poorly controlled type 2 diabetes mellitus with circulatory disorder) med changes:Basaglar 50 units at bedtime, Ozempic 1 mg weekly in a.m.     Hospital visits:  Medication Reconciliation was completed by comparing discharge summary, patient's EMR and Pharmacy list, and upon discussion with patient.  Admitted to the hospital on 05/30/21 due to fall. Discharge date was 05/30/21. Discharged from [facility] ED.   Medications that remain the same after Hospital Discharge:??  -All other medications will remain the same.    Medications: Outpatient Encounter Medications as of 06/18/2021  Medication Sig Note   acetaminophen (TYLENOL) 500 MG tablet Take 1,000 mg by mouth 2 (two) times daily.    albuterol (VENTOLIN HFA) 108 (90 Base) MCG/ACT inhaler TAKE 2 PUFFS BY MOUTH EVERY 6 HOURS AS NEEDED FOR WHEEZE OR SHORTNESS OF BREATH    amLODipine (NORVASC) 5 MG tablet TAKE ONE TABLET BY MOUTH AT BREAKFAST AND AT BEDTIME    aspirin EC 81 MG tablet Take 1 tablet (81 mg total) by mouth daily.    diclofenac (VOLTAREN) 50 MG EC tablet Take 50 mg by mouth 3 (three) times daily.    furosemide (LASIX) 40 MG tablet TAKE ONE TABLET BY MOUTH EVERY MORNING    gabapentin (NEURONTIN) 300 MG capsule Take 300 mg by mouth 3 (three) times daily.  08/29/2020: As needed   hydrALAZINE (APRESOLINE) 25 MG tablet Take 1 tablet (25 mg total) by mouth 3 (three) times daily as needed (for BP > 150/90).    Insulin Glargine (BASAGLAR KWIKPEN) 100 UNIT/ML Inject 50 Units into the skin at bedtime.    Insulin Pen Needle (PEN NEEDLES) 31G X 8 MM MISC 1 Device by Does not apply  route daily.    Insulin Syringe-Needle U-100 26G X 1/2" 1 ML MISC Use daily for insulin injection as directed    ipratropium-albuterol (DUONEB) 0.5-2.5 (3) MG/3ML SOLN Take 3 mLs by nebulization every 6 (six) hours as needed (Wheezing or dyspnea.).    levothyroxine (SYNTHROID) 200 MCG tablet TAKE ONE TABLET BY MOUTH monday-saturday before breakfast and TAKE 1/2 TABLET BY MOUTH ONCE WEEKLY BEFORE BREAKFAST ON SUNDAYS    metFORMIN (GLUCOPHAGE) 500 MG tablet Take 1 tablet (500 mg total) by mouth 2 (two) times daily with a meal.    metoprolol tartrate (LOPRESSOR) 25 MG tablet TAKE ONE TABLET BY MOUTH EVERY MORNING and TAKE ONE TABLET BY MOUTH EVERYDAY AT BEDTIME    morphine (MS CONTIN) 15 MG 12 hr tablet Take 15 mg by mouth daily.    morphine (MS CONTIN) 30 MG 12 hr tablet Take 30 mg by mouth 2 (two) times daily.    naloxone (NARCAN) nasal spray 4 mg/0.1 mL One spray in either nostril once for known/suspected opioid overdose. May repeat every 2-3 minutes in alternating nostril til EMS arrives    ondansetron (ZOFRAN-ODT) 4 MG disintegrating tablet DISSOLVE ONE TABLET BY MOUTH EVERY 6 HOURS AS NEEDED FOR NAUSEA AND VOMITING    PARoxetine (PAXIL) 40 MG tablet Take 1 tablet (40 mg total) by mouth daily.    potassium chloride SA (KLOR-CON M20) 20 MEQ tablet TAKE 2 TABLETS EVERY DAY AS NEEDED FOR CRAMPING 08/29/2020: 08/29/20  very rarely   Pseudoephedrine-Guaifenesin [phone] MG TB12 Take by mouth.    Semaglutide, 1 MG/DOSE, (OZEMPIC, 1 MG/DOSE,) 4 MG/3ML SOPN Inject 1 mg into the skin once a week.    simvastatin (ZOCOR) 20 MG tablet Take 1 tablet (20 mg total) by mouth daily.    tiZANidine (ZANAFLEX) 2 MG tablet Take 2 mg by mouth as needed for muscle spasms.     tiZANidine (ZANAFLEX) 4 MG tablet Take 4 mg by mouth every 8 (eight) hours as needed.    traZODone (DESYREL) 150 MG tablet Take by mouth at bedtime.    No facility-administered encounter medications on file as of 06/18/2021.    BP Readings from  Last 3 Encounters:  06/05/21 112/76  06/04/21 126/74  05/30/21 (!) 115/53    Lab Results  Component Value Date   HGBA1C 8.6 (A) 06/04/2021      Last adherence delivery date:05/24/21      Patient is due for next adherence delivery on: 06/28/21  Multiple attempts made to reach patient. Unsuccessful outreach. Will refill based off of last adherence fill.   This delivery to include: Adherence Packaging  30 Days  Metoprolol Tartrate 25 mg 1 tab breakfast and 1 tab bedtime Metformin 500 mg BID - 1 tab breakfast and  1 tab bedtime Simvastatin 20 mg 1 tab bedtime Amlodipine 5 mg 1 tab breakfast and 1 tab bedtime Hydralazine 25 mg 1 tab breakfast and 1 tab bedtime Levothyroxine 200 mcg 1-tab daily breakfast except 1/2 tab on Sundays Paroxetine 40 mg 1 tab bedtime Furosemide 40 mg 1 tab daily - breakfast (vial) Tizanidine 2 mg 1 tab every 8 hrs. as needed (vial) Morphine 15 mg ER - (vial) Trazodone 150 1 tab at bedtime   No refill request needed.  Delivery scheduled for 06/28/21. Unable to speak with patient to confirm date.   Annual wellness visit in last year? No Most Recent BP reading:112/76 06/05/21  If Diabetic: Most recent A1C reading:06/04/21 Last eye exam / retinopathy screening:02/08/19 Last diabetic foot exam: 10/08/18   Ethelene Hal Clinical Pharmacist Assistant [phone]

## 2021-06-22 DIAGNOSIS — H43813 Vitreous degeneration, bilateral: Secondary | ICD-10-CM | POA: Diagnosis not present

## 2021-06-22 DIAGNOSIS — H353222 Exudative age-related macular degeneration, left eye, with inactive choroidal neovascularization: Secondary | ICD-10-CM | POA: Diagnosis not present

## 2021-06-22 DIAGNOSIS — H43822 Vitreomacular adhesion, left eye: Secondary | ICD-10-CM | POA: Diagnosis not present

## 2021-06-22 DIAGNOSIS — H353211 Exudative age-related macular degeneration, right eye, with active choroidal neovascularization: Secondary | ICD-10-CM | POA: Diagnosis not present

## 2021-06-25 DIAGNOSIS — E119 Type 2 diabetes mellitus without complications: Secondary | ICD-10-CM | POA: Diagnosis not present

## 2021-06-25 NOTE — Telephone Encounter (Signed)
Received call back from patient.  She reports Dr Cruzita Lederer increased her Ozempic to 1 mg weekly. Faxed update to [employer] with new dose per patient request.

## 2021-06-26 ENCOUNTER — Ambulatory Visit (INDEPENDENT_AMBULATORY_CARE_PROVIDER_SITE_OTHER): Payer: Medicare HMO | Admitting: Gastroenterology

## 2021-06-26 ENCOUNTER — Encounter: Payer: Self-pay | Admitting: Gastroenterology

## 2021-06-26 ENCOUNTER — Other Ambulatory Visit: Payer: Medicare HMO

## 2021-06-26 VITALS — BP 120/58 | HR 65 | Ht 69.0 in | Wt 263.0 lb

## 2021-06-26 DIAGNOSIS — K7460 Unspecified cirrhosis of liver: Secondary | ICD-10-CM

## 2021-06-26 NOTE — Patient Instructions (Addendum)
If you are age 65 or older, your body mass index should be between 23-30. Your Body mass index is 38.84 kg/m. If this is out of the aforementioned range listed, please consider follow up with your Primary Care Provider.  If you are age 17 or younger, your body mass index should be between 19-25. Your Body mass index is 38.84 kg/m. If this is out of the aformentioned range listed, please consider follow up with your Primary Care Provider.   ________________________________________________________  The [facility] providers would like to encourage you to use [facility] to communicate with providers for non-urgent requests or questions.  Due to long hold times on the telephone, sending your provider a message by [facility] may be a faster and more efficient way to get a response.  Please allow 48 business hours for a response.  Please remember that this is for non-urgent requests.  _______________________________________________________  Dennis Bast have been scheduled for an endoscopy. Please follow written instructions given to you at your visit today. If you use inhalers (even only as needed), please bring them with you on the day of your procedure.  Your provider has requested that you go to the basement level for lab work before leaving today. Press "B" on the elevator. The lab is located at the first door on the left as you exit the elevator.  Due to recent changes in healthcare laws, you may see the results of your imaging and laboratory studies on MyChart before your provider has had a chance to review them.  We understand that in some cases there may be results that are confusing or concerning to you. Not all laboratory results come back in the same time frame and the provider may be waiting for multiple results in order to interpret others.  Please give [place] 48 hours in order for your provider to thoroughly review all the results before contacting the office for clarification of your results.    It was a  pleasure to see you today!  Thank you for trusting me with your gastrointestinal care!

## 2021-06-26 NOTE — Progress Notes (Signed)
   [facility] Progress Note  Chief Complaint: Cirrhosis  Subjective  History: From my August 2021 office follow-up note: "Longstanding abdominal pain and bloating with altered bowel habits related to her multiple complex medical issues, chronic opioids, poorly controlled diabetes.  It sounds like she had some rectal spasm during diarrhea, something which has occurred in the past.  This was radiated up into the back she had concerns it was related to her lower back problem. The subcutaneous lesion is probably cystic and benign.  If it continues to bother her and certainly if it enlarges, I recommend a primary care evaluation.   Plan: I did my best to explain to Carlen, as I have done in the past, the treatment options are limited due to the nature of her digestive conditions.  She knows not to take Imodium because will precipitate constipation.  She is bothered by bloating and gas which is primarily a motility issue, but could be something like bacterial overgrowth.  She certainly has the risk factors on chronic opioids and poorly controlled diabetes.   I gave her a 10-day course of metronidazole 500 mg twice daily   I would favor her using Pepto-Bismol rather than Imodium for treatment of intermittent diarrhea.   She asked about colon cancer screening since her last colonoscopy had been in 2009.  Given her medical complexity and procedural risks, I recommended a Cologuard instead.  If positive, her colonoscopy would need to be done in the hospital endoscopy lab."  That he also has cryptogenic cirrhosis suspected to be from fatty liver as well as longstanding poorly controlled diabetes. ______________   But he says she rarely has diarrhea, and that is mostly Been checked by her opioid pain control.  Appetite is variable, intermittent nausea, no vomiting, denies dysphagia.  Generalized fatigue as before.  She has been able to lose some weight through dietary changes of cutting carbs and  soda and trying to consume more fresh fruits and vegetables.  With that she believes she is lost at least 30 pounds since I last saw her. Sadly, she is still mourning the loss of her son who died suddenly earlier this year.  ROS: Cardiovascular:  no chest pain Respiratory: Intermittent cough and dyspnea, occasionally uses her oxygen. Arthralgias Depression reportedly stable Remainder of systems negative except as above The patient's Past Medical, Family and Social History were reviewed and are on file in the EMR.  Objective:  Med list reviewed  Current Outpatient Medications:    acetaminophen (TYLENOL) 500 MG tablet, Take 1,000 mg by mouth every 6 (six) hours as needed., Disp: , Rfl:    albuterol (VENTOLIN HFA) 108 (90 Base) MCG/ACT inhaler, TAKE 2 PUFFS BY MOUTH EVERY 6 HOURS AS NEEDED FOR WHEEZE OR SHORTNESS OF BREATH, Disp: 18 g, Rfl: 11   amLODipine (NORVASC) 5 MG tablet, TAKE ONE TABLET BY MOUTH AT BREAKFAST AND AT BEDTIME, Disp: 180 tablet, Rfl: 0   aspirin EC 81 MG tablet, Take 1 tablet (81 mg total) by mouth daily., Disp: 90 tablet, Rfl: 3   diclofenac (VOLTAREN) 50 MG EC tablet, Take 50 mg by mouth 3 (three) times daily., Disp: , Rfl:    furosemide (LASIX) 40 MG tablet, TAKE ONE TABLET BY MOUTH EVERY MORNING (Patient taking differently: daily as needed.), Disp: 30 tablet, Rfl: 5   gabapentin (NEURONTIN) 300 MG capsule, Take 300 mg by mouth 3 (three) times daily. , Disp: , Rfl:    hydrALAZINE (APRESOLINE) 25 MG tablet, Take 1   tablet (25 mg total) by mouth 3 (three) times daily as needed (for BP > 150/90)., Disp: 90 tablet, Rfl: 5   Insulin Glargine (BASAGLAR KWIKPEN) 100 UNIT/ML, Inject 50 Units into the skin at bedtime., Disp: 30 mL, Rfl: 3   Insulin Pen Needle (PEN NEEDLES) 31G X 8 MM MISC, 1 Device by Does not apply route daily., Disp: 90 each, Rfl: 3   Insulin Syringe-Needle U-100 26G X 1/2" 1 ML MISC, Use daily for insulin injection as directed, Disp: 100 each, Rfl: 3    ipratropium-albuterol (DUONEB) 0.5-2.5 (3) MG/3ML SOLN, Take 3 mLs by nebulization every 6 (six) hours as needed (Wheezing or dyspnea.)., Disp: 360 mL, Rfl: 11   levothyroxine (SYNTHROID) 200 MCG tablet, TAKE ONE TABLET BY MOUTH monday-saturday before breakfast and TAKE 1/2 TABLET BY MOUTH ONCE WEEKLY BEFORE BREAKFAST ON SUNDAYS, Disp: 90 tablet, Rfl: 0   metFORMIN (GLUCOPHAGE) 500 MG tablet, Take 1 tablet (500 mg total) by mouth 2 (two) times daily with a meal., Disp: 180 tablet, Rfl: 3   metoprolol tartrate (LOPRESSOR) 25 MG tablet, TAKE ONE TABLET BY MOUTH EVERY MORNING and TAKE ONE TABLET BY MOUTH EVERYDAY AT BEDTIME, Disp: 180 tablet, Rfl: 3   morphine (MS CONTIN) 15 MG 12 hr tablet, Take 15 mg by mouth daily., Disp: , Rfl:    morphine (MS CONTIN) 30 MG 12 hr tablet, Take 30 mg by mouth 2 (two) times daily., Disp: , Rfl:    naloxone (NARCAN) nasal spray 4 mg/0.1 mL, One spray in either nostril once for known/suspected opioid overdose. May repeat every 2-3 minutes in alternating nostril til EMS arrives, Disp: , Rfl:    ondansetron (ZOFRAN-ODT) 4 MG disintegrating tablet, DISSOLVE ONE TABLET BY MOUTH EVERY 6 HOURS AS NEEDED FOR NAUSEA AND VOMITING, Disp: 30 tablet, Rfl: 3   OXYGEN, Inhale 3 L into the lungs at bedtime., Disp: , Rfl:    PARoxetine (PAXIL) 40 MG tablet, Take 1 tablet (40 mg total) by mouth daily., Disp: 90 tablet, Rfl: 1   potassium chloride SA (KLOR-CON M20) 20 MEQ tablet, TAKE 2 TABLETS EVERY DAY AS NEEDED FOR CRAMPING, Disp: 180 tablet, Rfl: 1   Pseudoephedrine-Guaifenesin 120-1200 MG TB12, Take by mouth as needed., Disp: , Rfl:    Semaglutide, 1 MG/DOSE, (OZEMPIC, 1 MG/DOSE,) 4 MG/3ML SOPN, Inject 1 mg into the skin once a week., Disp: 9 mL, Rfl: 3   simvastatin (ZOCOR) 20 MG tablet, Take 1 tablet (20 mg total) by mouth daily., Disp: 30 tablet, Rfl: 6   tiZANidine (ZANAFLEX) 4 MG tablet, Take 4 mg by mouth every 8 (eight) hours as needed., Disp: , Rfl:    traZODone (DESYREL) 150  MG tablet, Take by mouth at bedtime as needed., Disp: , Rfl:    Vital signs in last 24 hrs: Vitals:   06/26/21 1312  BP: (!) 120/58  Pulse: 65  SpO2: 93%   Wt Readings from Last 3 Encounters:  06/26/21 263 lb (119.3 kg)  06/05/21 265 lb (120.2 kg)  06/04/21 267 lb 9.6 oz (121.4 kg)    Physical Exam  Pleasant as always, chronically ill-appearing.  Speech fluent, gait slow but steady, no asterixis HEENT: sclera anicteric, oral mucosa moist without lesions Neck: supple, no thyromegaly, JVD or lymphadenopathy Cardiac: RRR without murmurs, S1S2 heard, no peripheral edema, but bilateral stasis dermatitis and discoloration of feet consistent with venous insufficiency Pulm: clear to auscultation bilaterally, normal RR and effort noted Abdomen: soft, no tenderness, with active bowel sounds. No guarding or   palpable hepatosplenomegaly.  (Limited by body habitus).  Lap-Band port mid abdomen toward right Skin; warm and dry, no jaundice or rash  Labs:  CBC Latest Ref Rng & Units 05/30/2021 11/01/2020 05/03/2020  WBC 4.0 - 10.5 K/uL 11.9(H) 7.1 8.9  Hemoglobin 12.0 - 15.0 g/dL 13.1 13.0 12.2  Hematocrit 36.0 - 46.0 % 43.4 39.7 39.9  Platelets 150 - 400 K/uL 176 132.0(L) 174   BMP Latest Ref Rng & Units 05/30/2021 11/01/2020 05/03/2020  Glucose 70 - 99 mg/dL 237(H) 178(H) 268(H)  BUN 8 - 23 mg/dL _0 Creatinine 0.44 - 1.00 mg/dL 1.33(H) 0.98 0.97  BUN/Creat Ratio 6 - 22 (calc) - - NOT APPLICABLE  Sodium 035 - 145 mmol/L 131(L) 138 138  Potassium 3.5 - 5.1 mmol/L 4.0 4.7 4.8  Chloride 98 - 111 mmol/L 94(L) 98 96(L)  CO2 22 - 32 mmol/L 26 34(H) 34(H)  Calcium 8.9 - 10.3 mg/dL 8.7(L) 9.5 8.9   Hepatic Function Latest Ref Rng & Units 05/30/2021 11/01/2020 05/03/2020  Total Protein 6.5 - 8.1 g/dL 7.1 7.4 7.1  Albumin 3.5 - 5.0 g/dL 3.6 3.8 -  AST 15 - 41 U/L 23 34 22  ALT 0 - 44 U/L _1 Alk Phosphatase 38 - 126 U/L 97 79 -  Total Bilirubin 0.3 - 1.2 mg/dL 0.5 0.9 0.5  Bilirubin, Direct  0.00 - 0.40 mg/dL - - -   Lab Results  Component Value Date   INR 1.1 05/30/2021   INR 1.1 (H) 03/01/2020   INR 1.2 (H) 05/28/2019   Hemoglobin A1c 8.6  Last TSH normal June 2022  Last AFP 2.1 in July 2021  Last EGD Jan 2018 - no varices.  Limited visualization due to retained food in stomach ___________________________________________ Radiologic studies:  Last screening ultrasound July 2021 ____________________________________________ Other: Negative Cologuard on 04/25/2020  _____________________________________________ Assessment & Plan  Assessment: Encounter Diagnosis  Name Primary?   Cirrhosis of liver without ascites, unspecified hepatic cirrhosis type ([place]) Yes   She appears stable overall since I last saw her, at least from the standpoint of her cirrhosis and digestive issues. Reinforced the need for hepatoma screening and variceal screening.   Plan: Alpha-fetoprotein and right upper quadrant ultrasound for HCC screening  Upper endoscopy for variceal screening.  She was agreeable after discussion of procedure and risks  The benefits and risks of the planned procedure were described in detail with the patient or (when appropriate) their health care proxy.  Risks were outlined as including, but not limited to, bleeding, infection, perforation, adverse medication reaction leading to cardiac or pulmonary decompensation, pancreatitis (if ERCP).  The limitation of incomplete mucosal visualization was also discussed.  No guarantees or warranties were given.  Patient at increased risk for cardiopulmonary complications of procedure due to medical comorbidities. (Procedure must be done in hospital outpatient endoscopy lab)  40 minutes were spent on this encounter (including chart review, history/exam, counseling/coordination of care, and documentation) > 50% of that time was spent on counseling and coordination of care.   [name]

## 2021-06-27 LAB — AFP TUMOR MARKER: AFP-Tumor Marker: 2.7 ng/mL

## 2021-06-29 DIAGNOSIS — I130 Hypertensive heart and chronic kidney disease with heart failure and stage 1 through stage 4 chronic kidney disease, or unspecified chronic kidney disease: Secondary | ICD-10-CM | POA: Diagnosis not present

## 2021-06-29 DIAGNOSIS — Z794 Long term (current) use of insulin: Secondary | ICD-10-CM | POA: Diagnosis not present

## 2021-06-29 DIAGNOSIS — E1122 Type 2 diabetes mellitus with diabetic chronic kidney disease: Secondary | ICD-10-CM | POA: Diagnosis not present

## 2021-06-29 DIAGNOSIS — J9610 Chronic respiratory failure, unspecified whether with hypoxia or hypercapnia: Secondary | ICD-10-CM | POA: Diagnosis not present

## 2021-06-29 DIAGNOSIS — I509 Heart failure, unspecified: Secondary | ICD-10-CM | POA: Diagnosis not present

## 2021-06-29 DIAGNOSIS — K7460 Unspecified cirrhosis of liver: Secondary | ICD-10-CM | POA: Diagnosis not present

## 2021-06-29 DIAGNOSIS — J449 Chronic obstructive pulmonary disease, unspecified: Secondary | ICD-10-CM | POA: Diagnosis not present

## 2021-06-29 DIAGNOSIS — I700 Atherosclerosis of aorta: Secondary | ICD-10-CM | POA: Diagnosis not present

## 2021-06-29 DIAGNOSIS — M4600 Spinal enthesopathy, site unspecified: Secondary | ICD-10-CM | POA: Diagnosis not present

## 2021-07-03 ENCOUNTER — Other Ambulatory Visit: Payer: Self-pay

## 2021-07-03 ENCOUNTER — Ambulatory Visit (INDEPENDENT_AMBULATORY_CARE_PROVIDER_SITE_OTHER): Payer: Medicare HMO | Admitting: Pharmacist

## 2021-07-03 DIAGNOSIS — K7581 Nonalcoholic steatohepatitis (NASH): Secondary | ICD-10-CM

## 2021-07-03 DIAGNOSIS — I10 Essential (primary) hypertension: Secondary | ICD-10-CM

## 2021-07-03 DIAGNOSIS — I5032 Chronic diastolic (congestive) heart failure: Secondary | ICD-10-CM

## 2021-07-03 DIAGNOSIS — E782 Mixed hyperlipidemia: Secondary | ICD-10-CM

## 2021-07-03 DIAGNOSIS — E039 Hypothyroidism, unspecified: Secondary | ICD-10-CM

## 2021-07-03 DIAGNOSIS — F3289 Other specified depressive episodes: Secondary | ICD-10-CM

## 2021-07-03 DIAGNOSIS — E1159 Type 2 diabetes mellitus with other circulatory complications: Secondary | ICD-10-CM

## 2021-07-03 DIAGNOSIS — K7460 Unspecified cirrhosis of liver: Secondary | ICD-10-CM

## 2021-07-03 NOTE — Progress Notes (Signed)
Chronic Care Management Pharmacy Note  07/05/2021 Name:  Mary Acosta MRN:  [id number] DOB:  03/14/1956  Summary: -Pt is missing levothyroxine dose fairly often due to forgetting; it is in pill packs and she has to separate it from other AM meds -Pt has not been taking trazodone for past few weeks and reports no change in sleep quality;  Recommendations/Changes made from today's visit: -Remove levothyroxine from pill packs and fill in vial; pt to place vial on bedside table to remind her to take it first thing in AM -Removed trazodone from med list (no benefit) -Renew pt assistance for Ozempic and Basaglar by 2023   Subjective: Mary Acosta is an 65 y.o. year old female who is a primary patient of Burns, Claudina Lick, MD.  The CCM team was consulted for assistance with disease management and care coordination needs.    Engaged with patient by telephone for follow up visit in response to provider referral for pharmacy case management and/or care coordination services.   Consent to Services:  The patient was given information about Chronic Care Management services, agreed to services, and gave verbal consent prior to initiation of services.  Please see initial visit note for detailed documentation.   Patient Care Team: Binnie Rail, MD as PCP - General (Internal Medicine) Stanford Breed Denice Bors, MD as PCP - Cardiology (Cardiology) Charlton Haws, [facility] as Pharmacist (Pharmacist) Desma Maxim, MD as Referring Physician (Ophthalmology) Opthamology, [facility] as Consulting Physician (Ophthalmology)  Recent office visits: 06/05/21 Dr Quay Burow OV: acute visit; hx of multiple falls, hallucinations (stopped since stopping hydroxyzine); advised f/u with GI for cirrhosis; d/c hydralazine 100 mg, start hydralazine 25 mg PRN BP > 150/90  03/15/21 sent in myrbetriq for bladder spasms 03/06/21 Dr Sharlet Salina VV: c/o hallucinations, w/ hyperglycemia (600), diarrhea; advised to go to  ER  11/01/20 Dr Quay Burow OV: chronic f/u. Her son died unexpectedly recently. Vit D slightly low, increase Vitamin D by 1000 IU.   09/01/20 Dr Quay Burow VV: URI, rx'd Z-pak.  Recent consult visits: 06/26/21 Dr Loletha Carrow (GI): c/o bloating/gas, rx'd Flagyl x 10-day. Advised cologuard. Ordered endoscopy for variceal screening and US liver.  06/04/21 Dr Cruzita Lederer (endocrine): f/u DM. A1c up to 8.6; restart Basaglar 50 units and increase Ozempic to 1 mg.  01/26/21 Dr Aram Candela [facility] pain mgmt): increase diclofenac to TID prn  01/15/21 Dr Cruzita Lederer (endocrine): f/u DM; pt stopped insulin 1 month prior, continues on metformin and Ozempic. Advised to d/c insulin completely.  01/11/21 Dr Sima Matas (psych): f/u memory issues, postconcussion syndrome; referred for formal neuropsychological testing  12/08/20 Dr Michail Sermon [facility] spine): SI joint injection.  10/13/20 Dr Cruzita Lederer (endocrine): A1c down to 7.6. change Rybelsus to Ozempic 0.5 mg. Reduce Levothyroxine to 200 mcg 6/7 days and 100 mcg 1/7 days  08/29/20 NP Ward Givens (neurology): f/u neuropathy, fibromyalgia, memory issues. She stopped donepezil previously due to cost. Memory score has declined since 2019. Ordered neuropsych testing before prescribing medication.  08/28/20 Dr Aram Candela [facility] pain mgmt): refilled morphine, continue meloxicam, gabapentin, tizanidine. Restart Amitiza BID. Decrease amitriptyline to 75 mg HS.  08/09/20 Leonia Reader (Nutrition): rec'd multivitamin. Add protein to breakfast. Stop regular Kool-aid, get unsweetened. Try flavored waters.  08/04/20 Dr Cruzita Lederer (endocrine): switched Rybelsus to [place] temporarily until PAP approved. Reduced levothyroxine to 200 mcg x 6 days with 100 mcg (1/2 tab) on 7th day.  07/05/20 Dr Cruzita Lederer (endocrine): started metformin 500 mg BID.  Hospital visits: Medication Reconciliation was completed by comparing  discharge summary, patient's EMR and Pharmacy list, and upon discussion with patient.  Admitted to the ED on  05/30/21 due to recurrent falls. Discharge date was 05/30/21. Discharged from [facility].    Medications that remain the same after Hospital Discharge:??  -All other medications will remain the same.    Objective:  Lab Results  Component Value Date   CREATININE 1.33 (H) 05/30/2021   BUN 17 05/30/2021   GFR 61.05 11/01/2020   GFRNONAA 45 (L) 05/30/2021   GFRAA 72 05/03/2020   NA 131 (L) 05/30/2021   K 4.0 05/30/2021   CALCIUM 8.7 (L) 05/30/2021   CO2 26 05/30/2021    Lab Results  Component Value Date/Time   HGBA1C 8.6 (A) 06/04/2021 11:23 AM   HGBA1C 6.9 (A) 01/15/2021 05:12 PM   HGBA1C 10.4 (H) 05/03/2020 03:17 PM   HGBA1C 11.0 (H) 10/22/2019 02:11 PM   GFR 61.05 11/01/2020 02:27 PM   GFR 57.25 (L) 10/22/2019 02:11 PM   MICROALBUR <0.7 09/19/2017 02:26 PM   MICROALBUR 0.8 02/05/2017 11:09 AM    Last diabetic Eye exam:  Lab Results  Component Value Date/Time   HMDIABEYEEXA No Retinopathy 05/29/2016 12:00 AM    Last diabetic Foot exam: No results found for: HMDIABFOOTEX   Lab Results  Component Value Date   CHOL 108 11/01/2020   HDL 30.10 (L) 11/01/2020   LDLCALC 49 11/01/2020   TRIG 141.0 11/01/2020   CHOLHDL 4 11/01/2020    Hepatic Function Latest Ref Rng & Units 05/30/2021 11/01/2020 05/03/2020  Total Protein 6.5 - 8.1 g/dL 7.1 7.4 7.1  Albumin 3.5 - 5.0 g/dL 3.6 3.8 -  AST 15 - 41 U/L 23 34 22  ALT 0 - 44 U/L '18 17 16  ' Alk Phosphatase 38 - 126 U/L 97 79 -  Total Bilirubin 0.3 - 1.2 mg/dL 0.5 0.9 0.5  Bilirubin, Direct 0.00 - 0.40 mg/dL - - -    Lab Results  Component Value Date/Time   TSH 0.51 02/02/2021 01:35 PM   TSH 0.14 (L) 08/04/2020 11:57 AM   FREET4 1.43 02/02/2021 01:35 PM   FREET4 1.59 08/04/2020 11:57 AM    CBC Latest Ref Rng & Units 05/30/2021 11/01/2020 05/03/2020  WBC 4.0 - 10.5 K/uL 11.9(H) 7.1 8.9  Hemoglobin 12.0 - 15.0 g/dL 13.1 13.0 12.2  Hematocrit 36.0 - 46.0 % 43.4 39.7 39.9  Platelets 150 - 400 K/uL 176 132.0(L) 174    Lab  Results  Component Value Date/Time   VD25OH 26.05 (L) 11/01/2020 02:27 PM    Clinical ASCVD: Yes  The ASCVD Risk score (Arnett DK, et al., 2019) failed to calculate for the following reasons:   The patient has a prior MI or stroke diagnosis    Depression screen [facility] 2/9 05/28/2021 01/28/2020 09/04/2018  Decreased Interest 3 1 0  Down, Depressed, Hopeless 3 0 0  PHQ - 2 Score 6 1 0  Altered sleeping 3 - -  Tired, decreased energy 3 - -  Change in appetite 1 - -  Feeling bad or failure about yourself  0 - -  Trouble concentrating 3 - -  Moving slowly or fidgety/restless 3 - -  Suicidal thoughts 0 - -  PHQ-9 Score 19 - -  Difficult doing work/chores Somewhat difficult - -  Some recent data might be hidden     Social History   Tobacco Use  Smoking Status Former   Packs/day: 2.50   Years: 39.00   Pack years: 97.50  Types: Cigarettes   Quit date: 10/22/2010   Years since quitting: 10.7  Smokeless Tobacco Never   BP Readings from Last 3 Encounters:  06/26/21 (!) 120/58  06/05/21 112/76  06/04/21 126/74   Pulse Readings from Last 3 Encounters:  06/26/21 65  06/05/21 70  06/04/21 61   Wt Readings from Last 3 Encounters:  06/26/21 263 lb (119.3 kg)  06/05/21 265 lb (120.2 kg)  06/04/21 267 lb 9.6 oz (121.4 kg)   BMI Readings from Last 3 Encounters:  06/26/21 38.84 kg/m  06/05/21 39.13 kg/m  06/04/21 39.52 kg/m   Assessment/Interventions: Review of patient past medical history, allergies, medications, health status, including review of consultants reports, laboratory and other test data, was performed as part of comprehensive evaluation and provision of chronic care management services.   SDOH:  (Social Determinants of Health) assessments and interventions performed: Yes  SDOH Screenings   Alcohol Screen: Low Risk    Last Alcohol Screening Score (AUDIT): 0  Depression (PHQ2-9): Medium Risk   PHQ-2 Score: 19  Financial Resource Strain: Medium Risk   Difficulty of  Paying Living Expenses: Somewhat hard  Food Insecurity: No Food Insecurity   Worried About Charity fundraiser in the Last Year: Never true   Ran Out of Food in the Last Year: Never true  Housing: Low Risk    Last Housing Risk Score: 0  Physical Activity: Inactive   Days of Exercise per Week: 0 days   Minutes of Exercise per Session: 0 min  Social Connections: Moderately Integrated   Frequency of Communication with Friends and Family: More than three times a week   Frequency of Social Gatherings with Friends and Family: More than three times a week   Attends Religious Services: More than 4 times per year   Active Member of Clubs or Organizations: No   Attends Music therapist: More than 4 times per year   Marital Status: Widowed  Stress: Stress Concern Present   Feeling of Stress : To some extent  Tobacco Use: Medium Risk   Smoking Tobacco Use: Former   Smokeless Tobacco Use: Never   Passive Exposure: Not on file  Transportation Needs: No Transportation Needs   Lack of Transportation (Medical): No   Lack of Transportation (Non-Medical): No    CCM Care Plan  Allergies  Allergen Reactions   Gabapentin Swelling    Swelling in legs Swelling in legs Swelling in legs   Losartan Other (See Comments)    Myalgias and muscle cramping Other reaction(s): Other (See Comments) Myalgias and muscle cramping Myalgias and muscle cramping Other reaction(s): Other (See Comments) Myalgias and muscle cramping   Aricept [Donepezil Hcl]     Nausea/vomiting, low BP   Hydroxyzine     hallucinations   Oxycodone Itching   Sulfa Antibiotics Nausea Only and Rash   Sulfonamide Derivatives Nausea Only    Medications Reviewed Today     Reviewed by Charlton Haws, [facility] (Pharmacist) on 07/03/21 at 1514  Med List Status: <None>   Medication Order Taking? Sig Documenting Provider Last Dose Status Informant  acetaminophen (TYLENOL) 500 MG tablet [id number] Yes Take 1,000 mg by  mouth every 6 (six) hours as needed. [provider] Taking Active Self  albuterol (VENTOLIN HFA) 108 (90 Base) MCG/ACT inhaler [id number] Yes TAKE 2 PUFFS BY MOUTH EVERY 6 HOURS AS NEEDED FOR WHEEZE OR SHORTNESS OF BREATH Burns, Claudina Lick, MD Taking Active Self  amLODipine (NORVASC) 5 MG tablet [id number] Yes TAKE ONE  TABLET BY MOUTH AT BREAKFAST AND AT BEDTIME Binnie Rail, MD Taking Active   aspirin EC 81 MG tablet [id number] Yes Take 1 tablet (81 mg total) by mouth daily. Lelon Perla, MD Taking Active Self  diclofenac (VOLTAREN) 50 MG EC tablet [id number] Yes Take 50 mg by mouth 3 (three) times daily. [provider] Taking Active   furosemide (LASIX) 40 MG tablet [id number] Yes TAKE ONE TABLET BY MOUTH EVERY MORNING  Patient taking differently: daily as needed.   Binnie Rail, MD Taking Active   gabapentin (NEURONTIN) 300 MG capsule [id number] Yes Take 300 mg by mouth 3 (three) times daily.  [provider] Taking Active Self           Med Note Jacobo Forest, Leilani Able   Tue Aug 29, 2020  7:46 AM) As needed  hydrALAZINE (APRESOLINE) 25 MG tablet [id number] Yes Take 1 tablet (25 mg total) by mouth 3 (three) times daily as needed (for BP > 150/90). Binnie Rail, MD Taking Active   Insulin Glargine [facility]) 100 UNIT/ML [id number] Yes Inject 50 Units into the skin at bedtime. Philemon Kingdom, MD Taking Active   Insulin Pen Needle (PEN NEEDLES) 31G X 8 MM MISC [id number] Yes 1 Device by Does not apply route daily. Binnie Rail, MD Taking Active   Insulin Syringe-Needle U-100 26G X 1/2" 1 ML MISC [id number] Yes Use daily for insulin injection as directed Burns, Claudina Lick, MD Taking Active   ipratropium-albuterol (DUONEB) 0.5-2.5 (3) MG/3ML SOLN [id number] Yes Take 3 mLs by nebulization every 6 (six) hours as needed (Wheezing or dyspnea.). Brand Males, MD Taking Active   levothyroxine (SYNTHROID) 200 MCG tablet [id number] Yes TAKE ONE TABLET BY MOUTH monday-saturday  before breakfast and TAKE 1/2 TABLET BY MOUTH ONCE WEEKLY BEFORE BREAKFAST ON SUNDAYS Burns, Claudina Lick, MD Taking Active   metFORMIN (GLUCOPHAGE) 500 MG tablet [id number] Yes Take 1 tablet (500 mg total) by mouth 2 (two) times daily with a meal. Philemon Kingdom, MD Taking Active   metoprolol tartrate (LOPRESSOR) 25 MG tablet [id number] Yes TAKE ONE TABLET BY MOUTH EVERY MORNING and TAKE ONE TABLET BY MOUTH EVERYDAY AT BEDTIME Lelon Perla, MD Taking Active   morphine (MS CONTIN) 15 MG 12 hr tablet [id number] Yes Take 15 mg by mouth daily. [provider] Taking Active   morphine (MS CONTIN) 30 MG 12 hr tablet [id number] Yes Take 30 mg by mouth 2 (two) times daily. [provider] Taking Active Self           Med Note Rosemarie Beath, MELISSA B   Sun Dec 03, 2015  3:39 PM)    naloxone [facility]) nasal spray 4 mg/0.1 mL [id number] Yes One spray in either nostril once for known/suspected opioid overdose. May repeat every 2-3 minutes in alternating nostril til EMS arrives [provider] Taking Active   ondansetron (ZOFRAN-ODT) 4 MG disintegrating tablet [id number] Yes DISSOLVE ONE TABLET BY MOUTH EVERY 6 HOURS AS NEEDED FOR NAUSEA AND VOMITING Doran Stabler, MD Taking Active   OXYGEN [id number] Yes Inhale 3 L into the lungs at bedtime. [provider] Taking Active   PARoxetine (PAXIL) 40 MG tablet [id number] Yes Take 1 tablet (40 mg total) by mouth daily. Binnie Rail, MD Taking Active   potassium chloride SA (KLOR-CON M20) 20 MEQ tablet [id number] Yes TAKE 2 TABLETS EVERY DAY AS NEEDED FOR CRAMPING Quay Burow, Claudina Lick, MD Taking Active  Med Note Linus Orn Aug 29, 2020  7:47 AM) 08/29/20 very rarely  Pseudoephedrine-Guaifenesin [phone] MG TB12 [id number] Yes Take by mouth as needed. [provider] Taking Active   Semaglutide, 1 MG/DOSE, (OZEMPIC, 1 MG/DOSE,) 4 MG/3ML SOPN [id number] Yes Inject 1 mg into the skin once a week. Philemon Kingdom, MD Taking  Active   simvastatin (ZOCOR) 20 MG tablet [id number] Yes Take 1 tablet (20 mg total) by mouth daily. Lelon Perla, MD Taking Active   tiZANidine (ZANAFLEX) 4 MG tablet [id number] Yes Take 4 mg by mouth every 8 (eight) hours as needed. [provider] Taking Active             Patient Active Problem List   Diagnosis Date Noted   Hallucinations 03/06/2021   Aortic atherosclerosis ([place]) 11/01/2020   Uncontrolled type 2 diabetes mellitus with circulatory disorder, with long-term current use of insulin 07/05/2020   PSVT (paroxysmal supraventricular tachycardia) ([place]) 05/31/2020   UTI (urinary tract infection) 03/01/2020   Grief 02/03/2019   Nausea 01/04/2019   Reactive airway disease 10/21/2018   Arthralgia 09/04/2018   Thyroid cancer ([place]) 04/30/2018   Obesity hypoventilation syndrome ([place]) 09/24/2017   Palpitations 09/18/2017   Diabetic neuropathy ([place]) 06/04/2017   Vitamin B12 deficiency 03/05/2017   Numbness and tingling in both hands 02/24/2017   OSA (obstructive sleep apnea) 01/31/2017   Liver cirrhosis secondary to NASH ([place]) 01/31/2017   Hypothyroidism 01/31/2017   Chronic narcotic use 01/31/2017   Fibromyalgia 01/31/2017   Hyperlipidemia 01/31/2017   Gastroparesis    Memory disorder 07/25/2016   Lump in neck 01/29/2016   Chronic diastolic (congestive) heart failure ([place]) 01/03/2016   Fatty liver 01/03/2016   Type 1 diabetes mellitus ([place]) 01/03/2016   Recurrent Clostridium difficile diarrhea 01/03/2016   Chronic respiratory failure ([place]) 11/10/2014   Physical deconditioning 09/26/2014   Dyspnea 09/26/2014   Hyperglycemia 07/09/2014   Iron deficiency anemia, unspecified  04/05/2011   Bariatric surgery status 04/05/2011   Vitamin D deficiency 10/22/2007   LOW BACK PAIN, CHRONIC 10/22/2007   INSOMNIA 10/22/2007   Obesity 10/14/2007   Chronic pain syndrome 10/14/2007   CARPAL TUNNEL SYNDROME 10/14/2007   ALLERGIC RHINITIS CAUSE UNSPECIFIED 10/14/2007    ARTHRITIS 10/14/2007   Anxiety state 08/25/2007   Depression 08/25/2007   Essential hypertension 08/25/2007   ASTHMA 08/25/2007   CONSTIPATION 08/25/2007   POLYMYALGIA RHEUMATICA 08/25/2007    Immunization History  Administered Date(s) Administered   Hep A / Hep B 02/05/2017   Hepatitis B, adult 03/05/2017, 08/14/2017   Influenza,inj,Quad PF,6+ Mos 05/27/2015, 05/30/2016, 06/13/2017, 05/05/2018, 05/22/2019, 05/03/2020, 06/05/2021   Influenza,inj,quad, With Preservative 04/26/2018, 05/24/2019   Influenza-Unspecified 05/26/2014   Moderna Sars-Covid-2 Vaccination 10/05/2019, 11/02/2019   Pneumococcal Conjugate-13 10/23/2015   Pneumococcal Polysaccharide-23 07/10/2014    Conditions to be addressed/monitored:  Hypertension, Hyperlipidemia, Diabetes, Hypothyroidism, Depression, Anxiety and Pain management  Care Plan : [place]  Updates made by Charlton Haws, [place] since 07/05/2021 12:00 AM     Problem: Hypertension, Hyperlipidemia, Diabetes, Hypothyroidism, Depression, Anxiety and Pain management   Priority: High     Long-Range Goal: Disease management   Start Date: 10/05/2020  Expected End Date: 04/04/2021  This Visit's Progress: On track  Recent Progress: On track  Priority: High  Note:   Current Barriers:  Unable to independently afford treatment regimen Unable to independently monitor therapeutic efficacy Unable to maintain control of diabetes  Pharmacist Clinical Goal(s):  Patient will verbalize ability to  afford treatment regimen achieve adherence to monitoring guidelines and medication adherence to achieve therapeutic efficacy maintain control of diabetes as evidenced by improvement in blood sugar  through collaboration with PharmD and provider.   Interventions: 1:1 collaboration with Binnie Rail, MD regarding development and update of comprehensive plan of care as evidenced by provider attestation and co-signature Inter-disciplinary care team  collaboration (see longitudinal plan of care) Comprehensive medication review performed; medication list updated in electronic medical record  Hypertension / Diastolic heart failure (BP goal < 130/80) -Controlled - per pt BP is improved since reducing hydralazine last month -Current home BP/HR readings: "normal" -Last ejection fraction: 60-65% (Date: 06/1119) -HF type: Diastolic -Current treatment: Amlodipine 5 mg twice a day Hydralazine 25 mg BID Metoprolol tartrate 25 mg twice a day Furosemide 20 mg as needed for swelling -Medications previously tried: spironolactone, lisinopril, losartan (myalgias?), HCTZ, valsartan -Educated on Benefits of medications for managing symptoms and prolonging life; Importance of blood pressure control -Recommended to continue current medication   Hyperlipidemia (LDL goal < 70) -Controlled - most recent LDL was at goal; pt denies muscle aches with simvastatin alone; Hx aortic atherosclerosis merits LDL goal < 70 -Previously consulted with cardiology and GI to ascertain appropriateness of statin given cirrhosis - all agree benefits > risks -Current treatment: Simvastatin 20 mg daily  Aspirin 81 mg daily -Medications previously tried: ezetimibe (muscle aches with statin + ezetimibe)  -Educated on Cholesterol goals; Benefits of statin for ASCVD risk reduction; -Recommended to continue current medication   Diabetes (A1c goal < 7%) -Not ideally controlled - A1c recently increased 6.9 > 8.6, pt was restarted on basal insulin and increased Ozempic; she has not yet received new Ozempic 1 mg dose from [employer] (new rx faxed 06/25/21) -Current home glucose readings fasting glucose: 165 post prandial glucose: 225 -Current medications: Metformin 500 mg BID Ozempic 1 mg weekly (taking 0.5 mg x 2) Basaglar 50 units HS -Medications previously tried: Rybelsus  -Denies hypoglycemic/hyperglycemic symptoms -Educated on A1c and blood sugar goals; Proper insulin  injection technique; Prevention and management of hypoglycemic episodes; -Counseled to check feet daily and get yearly eye exams -Recommended to continue current medication; can continue to inject Ozempic 0.5 mg twice a week until 1 mg dose arrives  Depression/Anxiety/Insomnia -Controlled - pt has not taken trazodone in a few weeks, she reports no difference in sleep -PHQ9: 19 (05/2021) -Current treatment: Paroxetine 40 mg daily Trazodone 150 mg HS -Medications previously tried/failed: hydroxyzine (hallucinations), amitriptyline, alprazolam, buspirone, desvenlafaxine, duloxetine, mirtazapine, sertraline -Connected with psych for mental health support (Dr Sanjuana Letters) -Trazodone does not appear to help with sleep; ok to d/c from med list -Recommended to continue current medication  Hypothyroidism (goal: maintain TSH in goal range) -Not ideally controlled - pt reports missing levothyroxine dose quite often; it is in her packs and she takes it out to try to separate it from other meds, but often forgets about it because of this -Current treatment  Levothyroxine 200 mcg daily except 1/2 tab on Sundays -Plan: coordinate with pharmacy remove levothyroxine from packs and fill in regular vial; advised patient to set vial on her bedside table as a reminder to take it when she wakes up  Cirrhosis / NASH (Goal: prevent progression) -Controlled  -Follows with Dr Loletha Carrow. Undergoing screening for variceal bleeding (endoscopy) Jan 2023. -Previously consulted with GI and cardiology regarding statin use and agreed benefits outweigh risks  Chronic pain (Goal: manage pain) -Controlled - pt reports pain is relatively controlled with current  regimen; she expresses desire to come off of opioids but she has never been able to control pain any other way -Follows with [facility] -hx lumbar disc degeneration, SI joint dyfunction, arthritis, neuropathy, fibromyalgia -Current treatment  Morphine ER 30 mg  BID Morphine ER 15 mg daily Gabapentin 300 mg TID prn Diclofenac 50 mg TID prn Tizanidine 4 mg TID prn -discussed chronic NSAID therapy (diclofenac) is not ideal given CKD, however it is one of the most effective therapies she takes and helps to limit opioid doses -Recommended to continue current medication  Health Maintenance -Vaccine gaps: Shingrix, PPSV23 (65+), covid booster   Patient Goals/Self-Care Activities Patient will:  - take medications as prescribed -focus on medication adherence by pill packs -check glucose twice daily -check blood pressure daily -collaborate with provider on medication access solutions (renew Basaglar, Ozempic PAP by 2023) -Set levothyroxine vial on bedside table to take first thing in AM    Medication Assistance: [employer] [place] [employer]) - enrollment ends 07/25/21 Basaglar [employer]) - enrollment ends 08/25/21 --advised pt to bring renewal forms to office when she receives them in the mail  Compliance/Adherence/Medication fill history: Care Gaps: Colonoscopy (due 11/25/17) - cologuard ordered per GI 06/26/21 Pap smear (due 05/27/19) Eye exam (due 02/08/20) Foot exam (due 10/09/19) DEXA (due 06/15/21)  Star-Rating Drugs: Simvastatin - LF 06/21/21 x 30 ds Metformin - LF 06/21/21 x 30 ds  Patient's preferred pharmacy is:  [facility] - [place], [place] - [address] [place] [address] [place] [address] Phone: [phone] Fax: [phone]  Uses pill box? Yes - pill packs Pt endorses 100% compliance  We discussed: Reviewed patient's [employer] medication and Epic medication profile assuring there are no discrepancies or gaps in therapy. Confirmed all fill dates appropriate and verified with patient that there is a sufficient quantity of all prescribed medications at home. Informed patient to call me any time if needing medications before scheduled deliveries.   Patient decided to: Utilize [facility]  pharmacy for medication synchronization, packaging and delivery  Care Plan and Follow Up Patient Decision:  Patient agrees to Care Plan and Follow-up.  Plan: Telephone follow up appointment with care management team member scheduled for:  3 months  Charlene Brooke, PharmD, [place], CPP Clinical Pharmacist Practitioner [facility] [phone]

## 2021-07-04 DIAGNOSIS — F419 Anxiety disorder, unspecified: Secondary | ICD-10-CM | POA: Diagnosis not present

## 2021-07-04 DIAGNOSIS — R69 Illness, unspecified: Secondary | ICD-10-CM | POA: Diagnosis not present

## 2021-07-04 DIAGNOSIS — F332 Major depressive disorder, recurrent severe without psychotic features: Secondary | ICD-10-CM | POA: Diagnosis not present

## 2021-07-05 NOTE — Patient Instructions (Signed)
Visit Information  Phone number for Pharmacist: [phone]   Goals Addressed             This Visit's Progress    Manage My Medicine       Timeframe:  Long-Range Goal Priority:  High Start Date:      10/05/20                       Expected End Date:    07/05/22           Follow Up Date Feb 2023   - call for medicine refill 2 or 3 days before it runs out - call if I am sick and can't take my medicine - keep a list of all the medicines I take; vitamins and herbals too - use [facility] for pill packaging -collaborate with provider on medication access solutions (renew Basaglar, Ozempic PAP by 2023) -Set levothyroxine vial on bedside table to take first thing in AM - contact [name] with any medication issues   Why is this important?   These steps will help you keep on track with your medicines.         Care Plan : [place]  Updates made by Charlton Haws, RPH since 07/05/2021 12:00 AM     Problem: Hypertension, Hyperlipidemia, Diabetes, Hypothyroidism, Depression, Anxiety and Pain management   Priority: High     Long-Range Goal: Disease management   Start Date: 10/05/2020  Expected End Date: 04/04/2021  This Visit's Progress: On track  Recent Progress: On track  Priority: High  Note:   Current Barriers:  Unable to independently afford treatment regimen Unable to independently monitor therapeutic efficacy Unable to maintain control of diabetes  Pharmacist Clinical Goal(s):  Patient will verbalize ability to afford treatment regimen achieve adherence to monitoring guidelines and medication adherence to achieve therapeutic efficacy maintain control of diabetes as evidenced by improvement in blood sugar  through collaboration with PharmD and provider.   Interventions: 1:1 collaboration with Binnie Rail, MD regarding development and update of comprehensive plan of care as evidenced by provider attestation and  co-signature Inter-disciplinary care team collaboration (see longitudinal plan of care) Comprehensive medication review performed; medication list updated in electronic medical record  Hypertension / Diastolic heart failure (BP goal < 130/80) -Controlled - per pt BP is improved since reducing hydralazine last month -Current home BP/HR readings: "normal" -Last ejection fraction: 60-65% (Date: 06/1119) -HF type: Diastolic -Current treatment: Amlodipine 5 mg twice a day Hydralazine 25 mg BID Metoprolol tartrate 25 mg twice a day Furosemide 20 mg as needed for swelling -Medications previously tried: spironolactone, lisinopril, losartan (myalgias?), HCTZ, valsartan -Educated on Benefits of medications for managing symptoms and prolonging life; Importance of blood pressure control -Recommended to continue current medication   Hyperlipidemia (LDL goal < 70) -Controlled - most recent LDL was at goal; pt denies muscle aches with simvastatin alone; Hx aortic atherosclerosis merits LDL goal < 70 -Previously consulted with cardiology and GI to ascertain appropriateness of statin given cirrhosis - all agree benefits > risks -Current treatment: Simvastatin 20 mg daily  Aspirin 81 mg daily -Medications previously tried: ezetimibe (muscle aches with statin + ezetimibe)  -Educated on Cholesterol goals; Benefits of statin for ASCVD risk reduction; -Recommended to continue current medication   Diabetes (A1c goal < 7%) -Not ideally controlled - A1c recently increased 6.9 > 8.6, pt was restarted on basal insulin and increased Ozempic; she has not yet received new Ozempic  1 mg dose from [employer] (new rx faxed 06/25/21) -Current home glucose readings fasting glucose: 165 post prandial glucose: 225 -Current medications: Metformin 500 mg BID Ozempic 1 mg weekly (taking 0.5 mg x 2) Basaglar 50 units HS -Medications previously tried: Rybelsus  -Denies hypoglycemic/hyperglycemic symptoms -Educated on  A1c and blood sugar goals; Proper insulin injection technique; Prevention and management of hypoglycemic episodes; -Counseled to check feet daily and get yearly eye exams -Recommended to continue current medication; can continue to inject Ozempic 0.5 mg twice a week until 1 mg dose arrives  Depression/Anxiety/Insomnia -Controlled - pt has not taken trazodone in a few weeks, she reports no difference in sleep -PHQ9: 19 (05/2021) -Current treatment: Paroxetine 40 mg daily Trazodone 150 mg HS -Medications previously tried/failed: hydroxyzine (hallucinations), amitriptyline, alprazolam, buspirone, desvenlafaxine, duloxetine, mirtazapine, sertraline -Connected with psych for mental health support (Dr Sanjuana Letters) -Trazodone does not appear to help with sleep; ok to d/c from med list -Recommended to continue current medication  Hypothyroidism (goal: maintain TSH in goal range) -Not ideally controlled - pt reports missing levothyroxine dose quite often; it is in her packs and she takes it out to try to separate it from other meds, but often forgets about it because of this -Current treatment  Levothyroxine 200 mcg daily except 1/2 tab on Sundays -Plan: coordinate with pharmacy remove levothyroxine from packs and fill in regular vial; advised patient to set vial on her bedside table as a reminder to take it when she wakes up  Cirrhosis / NASH (Goal: prevent progression) -Controlled  -Follows with Dr Loletha Carrow. Undergoing screening for variceal bleeding (endoscopy) Jan 2023. -Previously consulted with GI and cardiology regarding statin use and agreed benefits outweigh risks  Chronic pain (Goal: manage pain) -Controlled - pt reports pain is relatively controlled with current regimen; she expresses desire to come off of opioids but she has never been able to control pain any other way -Follows with [facility] pain management -hx lumbar disc degeneration, SI joint dyfunction, arthritis, neuropathy,  fibromyalgia -Current treatment  Morphine ER 30 mg BID Morphine ER 15 mg daily Gabapentin 300 mg TID prn Diclofenac 50 mg TID prn Tizanidine 4 mg TID prn -discussed chronic NSAID therapy (diclofenac) is not ideal given CKD, however it is one of the most effective therapies she takes and helps to limit opioid doses -Recommended to continue current medication  Health Maintenance -Vaccine gaps: Shingrix, PPSV23 (65+), covid booster   Patient Goals/Self-Care Activities Patient will:  - take medications as prescribed -focus on medication adherence by pill packs -check glucose twice daily -check blood pressure daily -collaborate with provider on medication access solutions (renew Basaglar, Ozempic PAP by 2023) -Set levothyroxine vial on bedside table to take first thing in AM      Patient verbalizes understanding of instructions provided today and agrees to view in [place].  Telephone follow up appointment with pharmacy team member scheduled for: 3 months  Charlene Brooke, PharmD, Para [place], CPP Clinical Pharmacist Practitioner [facility] [phone]

## 2021-07-12 DIAGNOSIS — J449 Chronic obstructive pulmonary disease, unspecified: Secondary | ICD-10-CM | POA: Diagnosis not present

## 2021-07-13 ENCOUNTER — Ambulatory Visit (HOSPITAL_COMMUNITY)
Admission: RE | Admit: 2021-07-13 | Discharge: 2021-07-13 | Disposition: A | Discharge status: Home / Self Care | Payer: Medicare HMO | Source: Ambulatory Visit | Attending: Gastroenterology | Admitting: Gastroenterology

## 2021-07-13 ENCOUNTER — Other Ambulatory Visit: Payer: Self-pay

## 2021-07-13 DIAGNOSIS — K7460 Unspecified cirrhosis of liver: Secondary | ICD-10-CM | POA: Diagnosis not present

## 2021-07-16 NOTE — Progress Notes (Signed)
Subjective:    Patient ID: Mary Acosta, female    DOB: Oct 28, 1955, 65 y.o.   MRN: [id number]  This visit occurred during the SARS-CoV-2 public health emergency.  Safety protocols were in place, including screening questions prior to the visit, additional usage of staff PPE, and extensive cleaning of exam room while observing appropriate contact time as indicated for disinfecting solutions.     HPI The patient is here for follow up of their chronic medical problems, including htn,   Sugars are better 150-165 in am, in 200's later in the day.  She is eating better.  She is trying to watch her calories and what she is eating.    Overall she feel better.     Medications and allergies reviewed with patient and updated if appropriate.  Patient Active Problem List   Diagnosis Date Noted   Hallucinations 03/06/2021   Aortic atherosclerosis ([place]) 11/01/2020   Uncontrolled type 2 diabetes mellitus with circulatory disorder, with long-term current use of insulin 07/05/2020   PSVT (paroxysmal supraventricular tachycardia) ([place]) 05/31/2020   UTI (urinary tract infection) 03/01/2020   Grief 02/03/2019   Nausea 01/04/2019   Reactive airway disease 10/21/2018   Arthralgia 09/04/2018   Thyroid cancer ([place]) 04/30/2018   Obesity hypoventilation syndrome ([place]) 09/24/2017   Palpitations 09/18/2017   Diabetic neuropathy ([place]) 06/04/2017   Vitamin B12 deficiency 03/05/2017   Numbness and tingling in both hands 02/24/2017   OSA (obstructive sleep apnea) 01/31/2017   Liver cirrhosis secondary to NASH ([place]) 01/31/2017   Hypothyroidism 01/31/2017   Chronic narcotic use 01/31/2017   Fibromyalgia 01/31/2017   Hyperlipidemia 01/31/2017   Gastroparesis    Memory disorder 07/25/2016   Lump in neck 01/29/2016   Chronic diastolic (congestive) heart failure ([place]) 01/03/2016   Fatty liver 01/03/2016   Type 1 diabetes mellitus ([place]) 01/03/2016   Recurrent Clostridium difficile diarrhea  01/03/2016   Chronic respiratory failure ([place]) 11/10/2014   Physical deconditioning 09/26/2014   Dyspnea 09/26/2014   Hyperglycemia 07/09/2014   Iron deficiency anemia, unspecified  04/05/2011   Bariatric surgery status 04/05/2011   Vitamin D deficiency 10/22/2007   LOW BACK PAIN, CHRONIC 10/22/2007   INSOMNIA 10/22/2007   Obesity 10/14/2007   Chronic pain syndrome 10/14/2007   CARPAL TUNNEL SYNDROME 10/14/2007   ALLERGIC RHINITIS CAUSE UNSPECIFIED 10/14/2007   ARTHRITIS 10/14/2007   Anxiety state 08/25/2007   Depression 08/25/2007   Essential hypertension 08/25/2007   ASTHMA 08/25/2007   CONSTIPATION 08/25/2007   POLYMYALGIA RHEUMATICA 08/25/2007    Current Outpatient Medications on File Prior to Visit  Medication Sig Dispense Refill   acetaminophen (TYLENOL) 500 MG tablet Take 1,000 mg by mouth every 6 (six) hours as needed.     albuterol (VENTOLIN HFA) 108 (90 Base) MCG/ACT inhaler TAKE 2 PUFFS BY MOUTH EVERY 6 HOURS AS NEEDED FOR WHEEZE OR SHORTNESS OF BREATH 18 g 11   amLODipine (NORVASC) 5 MG tablet TAKE ONE TABLET BY MOUTH AT BREAKFAST AND AT BEDTIME 180 tablet 0   aspirin EC 81 MG tablet Take 1 tablet (81 mg total) by mouth daily. 90 tablet 3   diclofenac (VOLTAREN) 50 MG EC tablet Take 50 mg by mouth 3 (three) times daily.     diclofenac (VOLTAREN) 50 MG EC tablet Take 1 tablet by mouth 3 (three) times daily as needed.     furosemide (LASIX) 40 MG tablet TAKE ONE TABLET BY MOUTH EVERY MORNING (Patient taking differently: daily as needed.) 30 tablet  5   gabapentin (NEURONTIN) 300 MG capsule Take 300 mg by mouth 3 (three) times daily.      hydrALAZINE (APRESOLINE) 25 MG tablet Take 1 tablet (25 mg total) by mouth 3 (three) times daily as needed (for BP > 150/90). 90 tablet 5   hydrOXYzine (VISTARIL) 50 MG capsule Take 50 mg by mouth 2 (two) times daily as needed.     Insulin Glargine (BASAGLAR KWIKPEN) 100 UNIT/ML Inject 50 Units into the skin at bedtime. 30 mL 3   Insulin  Pen Needle (PEN NEEDLES) 31G X 8 MM MISC 1 Device by Does not apply route daily. 90 each 3   Insulin Syringe-Needle U-100 26G X 1/2" 1 ML MISC Use daily for insulin injection as directed 100 each 3   ipratropium-albuterol (DUONEB) 0.5-2.5 (3) MG/3ML SOLN Take 3 mLs by nebulization every 6 (six) hours as needed (Wheezing or dyspnea.). 360 mL 11   levothyroxine (SYNTHROID) 200 MCG tablet TAKE ONE TABLET BY MOUTH monday-saturday before breakfast and TAKE 1/2 TABLET BY MOUTH ONCE WEEKLY BEFORE BREAKFAST ON SUNDAYS 90 tablet 0   metFORMIN (GLUCOPHAGE) 500 MG tablet Take 1 tablet (500 mg total) by mouth 2 (two) times daily with a meal. 180 tablet 3   metoprolol tartrate (LOPRESSOR) 25 MG tablet TAKE ONE TABLET BY MOUTH EVERY MORNING and TAKE ONE TABLET BY MOUTH EVERYDAY AT BEDTIME 180 tablet 3   morphine (MS CONTIN) 15 MG 12 hr tablet Take 15 mg by mouth daily.     morphine (MS CONTIN) 30 MG 12 hr tablet Take 30 mg by mouth 2 (two) times daily.     naloxone (NARCAN) nasal spray 4 mg/0.1 mL One spray in either nostril once for known/suspected opioid overdose. May repeat every 2-3 minutes in alternating nostril til EMS arrives     ondansetron (ZOFRAN-ODT) 4 MG disintegrating tablet DISSOLVE ONE TABLET BY MOUTH EVERY 6 HOURS AS NEEDED FOR NAUSEA AND VOMITING 30 tablet 3   OXYGEN Inhale 3 L into the lungs at bedtime.     PARoxetine (PAXIL) 40 MG tablet Take 1 tablet (40 mg total) by mouth daily. 90 tablet 1   potassium chloride SA (KLOR-CON M20) 20 MEQ tablet TAKE 2 TABLETS EVERY DAY AS NEEDED FOR CRAMPING 180 tablet 1   Pseudoephedrine-Guaifenesin [id number] MG TB12 Take by mouth as needed.     Semaglutide, 1 MG/DOSE, (OZEMPIC, 1 MG/DOSE,) 4 MG/3ML SOPN Inject 1 mg into the skin once a week. 9 mL 3   simvastatin (ZOCOR) 20 MG tablet Take 1 tablet (20 mg total) by mouth daily. 30 tablet 6   tiZANidine (ZANAFLEX) 4 MG tablet Take 4 mg by mouth every 8 (eight) hours as needed.     No current  facility-administered medications on file prior to visit.    Past Medical History:  Diagnosis Date   AKI (acute kidney injury) ([place]) 01/2017   Anxiety    with panic attacks   Arthritis    "back; feet; hands; shoulders" (08/26/2014)   Asthma    Cervical cancer ([place])    Chronic lower back pain    Chronic narcotic use    Chronic pain syndrome    [facility]   Cirrhosis ([place])    Clostridium difficile infection 2017   COPD (chronic obstructive pulmonary disease) (HCC)    Daily headache    Depression    Diabetic neuropathy ([place]) 06/04/2017   DJD (degenerative joint disease)    Fatty liver disease, nonalcoholic  Fibromyalgia    Frequency of urination    HCAP (healthcare-associated pneumonia) 08/26/2014   History of TIA (transient ischemic attack) 11-01-2010   NO RESIDUAL   Hyperlipidemia    Hypertension    Hypothyroidism    IDDM (insulin dependent diabetes mellitus)    Insomnia    Lumbar stenosis    Memory difficulty 07/25/2016   Nocturia    OSA (obstructive sleep apnea)    NO CPAP SINCE WT LOSS   Osteoarthritis    with severe disease in knee   Pneumonia "several times"   Polymyalgia rheumatica (HCC)    Scoliosis    Seasonal allergies    Thyroid cancer ([place])    Urgency of urination    Vaginal pain S/P SLING  FEB 2012    Past Surgical History:  Procedure Laterality Date   APPENDECTOMY  1982   BREAST EXCISIONAL BIOPSY Left 02/28/2005   Atypical Ductal Hyperplasia   CARDIAC CATHETERIZATION  09-04-2004   NORMAL CORONARY ANATOMY/ NORMAL LVF/ EF 60%   CARDIOVASCULAR STRESS TEST  12-27-2010  DR [place]   ABNORMAL NUCLEAR STUDY W/ /MILD INFERIOR ISCHEMIA/ EF 69%/  CT HEART ANGIOGRAM ;  NO ACUTE FINDINGS   CRYOABLATION  05/16/2003   w/LEEP FOR ABNORMAL PAP SMEAR   CYSTOSCOPY  05/18/2012   Procedure: CYSTOSCOPY;  Surgeon: Reece Packer, MD;  Location: [facility];  Service: Urology;  Laterality: N/A;  examination under anethesia    ESOPHAGOGASTRODUODENOSCOPY (EGD) WITH PROPOFOL N/A 09/03/2016   Procedure: ESOPHAGOGASTRODUODENOSCOPY (EGD) WITH PROPOFOL;  Surgeon: Doran Stabler, MD;  Location: [facility];  Service: Gastroenterology;  Laterality: N/A;   HYSTEROSCOPY WITH D & C  08-19-2007   PMB   KNEE ARTHROSCOPY W/ DEBRIDEMENT Left 03/29/2006   INTERNAL DERANGEMENT/ SEVERE DJD/ MENISCUS TEARS   LAPAROSCOPIC CHOLECYSTECTOMY  06-10-2002   LAPAROSCOPIC GASTRIC BANDING  03/01/2006   TRUNCAL VAGOTOMY/ PLACEMENT OF VG BAND   REVISION TOTAL KNEE ARTHROPLASTY Left 08-29-2008; 05/2009   TONSILLECTOMY  1969   TOTAL KNEE ARTHROPLASTY Left 01-23-2007   SEVERE DJD   TOTAL THYROIDECTOMY  11-22-2005   BILATERAL THYROID NODULES-- PAPILLARY CARCINOMA (0.5CM)/ ADENOMATOID NODULES   TRANSTHORACIC ECHOCARDIOGRAM  12-27-2010   LVSF NORMAL / EF 79-89%/ GRADE I DIASTOLIC DYSFUNCTION/ MILD MITRAL REGURG. / MILDLY DILATED LEFT ATRIUM/ MILDY INCREASED SYSTOLIC PRESSURE OF PULMONARY ARTERIES   TRANSVAGINAL SUBURETERAL TAPE/ SLING  09-28-2010   MIXED URINARY INCONTINENCE   TUBAL LIGATION  1983    Social History   Socioeconomic History   Marital status: Married    Spouse name: Not on file   Number of children: 2   Years of education: 12   Highest education level: Not on file  Occupational History   Occupation: disabled    Employer: UNEMPLOYED  Tobacco Use   Smoking status: Former    Packs/day: 2.50    Years: 39.00    Pack years: 97.50    Types: Cigarettes    Quit date: 10/22/2010    Years since quitting: 10.7   Smokeless tobacco: Never  Vaping Use   Vaping Use: Never used  Substance and Sexual Activity   Alcohol use: No    Alcohol/week: 0.0 standard drinks   Drug use: No   Sexual activity: Not Currently  Other Topics Concern   Not on file  Social History Narrative   Lives at home w/ her husband    Right-handed   Caffeine: 1 cup of coffee per week + [employer]   Social Determinants of Health  Financial Resource Strain:  Medium Risk   Difficulty of Paying Living Expenses: Somewhat hard  Food Insecurity: No Food Insecurity   Worried About Charity fundraiser in the Last Year: Never true   Ran Out of Food in the Last Year: Never true  Transportation Needs: No Transportation Needs   Lack of Transportation (Medical): No   Lack of Transportation (Non-Medical): No  Physical Activity: Inactive   Days of Exercise per Week: 0 days   Minutes of Exercise per Session: 0 min  Stress: Stress Concern Present   Feeling of Stress : To some extent  Social Connections: Moderately Integrated   Frequency of Communication with Friends and Family: More than three times a week   Frequency of Social Gatherings with Friends and Family: More than three times a week   Attends Religious Services: More than 4 times per year   Active Member of Clubs or Organizations: No   Attends Music therapist: More than 4 times per year   Marital Status: Widowed    Family History  Problem Relation Age of Onset   Diabetes Mother    Heart disease Mother    Dementia Mother    Heart disease Father    High blood pressure Father    Colon cancer Maternal Uncle        x 2   Breast cancer Other        great aunts x 5    Review of Systems  Constitutional:  Negative for fever.  Respiratory:  Positive for cough. Negative for shortness of breath and wheezing.   Cardiovascular:  Positive for leg swelling (occ). Negative for chest pain and palpitations.  Neurological:  Negative for dizziness and headaches.      Objective:   Vitals:   07/17/21 1300  BP: 116/64  Pulse: 69  Temp: 98.6 F (37 C)  SpO2: 92%   BP Readings from Last 3 Encounters:  07/17/21 116/64  06/26/21 (!) 120/58  06/05/21 112/76   Wt Readings from Last 3 Encounters:  07/17/21 260 lb (117.9 kg)  06/26/21 263 lb (119.3 kg)  06/05/21 265 lb (120.2 kg)   Body mass index is 38.4 kg/m.   Physical Exam    Constitutional: Appears well-developed and  well-nourished. No distress.  HENT:  Head: Normocephalic and atraumatic.  Neck: Neck supple. No tracheal deviation present. No thyromegaly present.  No cervical lymphadenopathy Cardiovascular: Normal rate, regular rhythm and normal heart sounds.   No murmur heard. No carotid bruit .  No edema Pulmonary/Chest: Effort normal and breath sounds normal. No respiratory distress. No has no wheezes. No rales.  Skin: Skin is warm and dry. Not diaphoretic.  Psychiatric: Normal mood and affect. Behavior is normal.      Assessment & Plan:    See Problem List for Assessment and Plan of chronic medical problems.

## 2021-07-17 ENCOUNTER — Telehealth: Payer: Self-pay

## 2021-07-17 ENCOUNTER — Other Ambulatory Visit: Payer: Self-pay

## 2021-07-17 ENCOUNTER — Encounter: Payer: Self-pay | Admitting: Internal Medicine

## 2021-07-17 ENCOUNTER — Ambulatory Visit (INDEPENDENT_AMBULATORY_CARE_PROVIDER_SITE_OTHER): Payer: Medicare HMO | Admitting: Internal Medicine

## 2021-07-17 VITALS — BP 116/64 | HR 69 | Temp 98.6°F | Ht 69.0 in | Wt 260.0 lb

## 2021-07-17 DIAGNOSIS — E890 Postprocedural hypothyroidism: Secondary | ICD-10-CM | POA: Diagnosis not present

## 2021-07-17 DIAGNOSIS — E1042 Type 1 diabetes mellitus with diabetic polyneuropathy: Secondary | ICD-10-CM

## 2021-07-17 DIAGNOSIS — I10 Essential (primary) hypertension: Secondary | ICD-10-CM

## 2021-07-17 DIAGNOSIS — Z23 Encounter for immunization: Secondary | ICD-10-CM

## 2021-07-17 MED ORDER — HYDRALAZINE HCL 25 MG PO TABS: 25.0000 mg | ORAL_TABLET | Freq: Two times a day (BID) | ORAL | 3 refills | Status: DC

## 2021-07-17 NOTE — Assessment & Plan Note (Signed)
Chronic BP well controlled Continue amloidpine 5 mg bid, metoprolol 25 bid, hydralazine 25 mg bid

## 2021-07-17 NOTE — Telephone Encounter (Signed)
Called and advised pt's husband samples are here at the office available for pick up.

## 2021-07-17 NOTE — Addendum Note (Signed)
Addended by: Marcina Millard on: 07/17/2021 02:01 PM   Modules accepted: Orders

## 2021-07-17 NOTE — Assessment & Plan Note (Signed)
Chronic Management per Dr Cruzita Lederer Last a1c 8.6% Sugars more consistent but still a little high - she will continue to work on her diet and weight

## 2021-07-17 NOTE — Patient Instructions (Signed)
    Pneumonia 23 booster today.    Medications changes include :   none    Please followup in 6 months

## 2021-07-17 NOTE — Assessment & Plan Note (Signed)
Chronic Taking medication consistently Euthyroid Continue levothyroxine 200 mg M-Sa, 100 mcg once on Sunday

## 2021-07-18 ENCOUNTER — Encounter: Payer: Self-pay | Admitting: Internal Medicine

## 2021-07-23 DIAGNOSIS — M25562 Pain in left knee: Secondary | ICD-10-CM | POA: Diagnosis not present

## 2021-07-23 DIAGNOSIS — G894 Chronic pain syndrome: Secondary | ICD-10-CM | POA: Diagnosis not present

## 2021-07-23 DIAGNOSIS — M25561 Pain in right knee: Secondary | ICD-10-CM | POA: Diagnosis not present

## 2021-07-23 DIAGNOSIS — M5137 Other intervertebral disc degeneration, lumbosacral region: Secondary | ICD-10-CM | POA: Diagnosis not present

## 2021-07-23 DIAGNOSIS — M533 Sacrococcygeal disorders, not elsewhere classified: Secondary | ICD-10-CM | POA: Diagnosis not present

## 2021-07-25 DIAGNOSIS — I5032 Chronic diastolic (congestive) heart failure: Secondary | ICD-10-CM | POA: Diagnosis not present

## 2021-07-25 DIAGNOSIS — E782 Mixed hyperlipidemia: Secondary | ICD-10-CM | POA: Diagnosis not present

## 2021-07-25 DIAGNOSIS — E1165 Type 2 diabetes mellitus with hyperglycemia: Secondary | ICD-10-CM

## 2021-07-25 DIAGNOSIS — I110 Hypertensive heart disease with heart failure: Secondary | ICD-10-CM

## 2021-07-25 DIAGNOSIS — R69 Illness, unspecified: Secondary | ICD-10-CM | POA: Diagnosis not present

## 2021-07-25 DIAGNOSIS — E1159 Type 2 diabetes mellitus with other circulatory complications: Secondary | ICD-10-CM | POA: Diagnosis not present

## 2021-07-25 DIAGNOSIS — Z7984 Long term (current) use of oral hypoglycemic drugs: Secondary | ICD-10-CM | POA: Diagnosis not present

## 2021-07-25 DIAGNOSIS — F3289 Other specified depressive episodes: Secondary | ICD-10-CM | POA: Diagnosis not present

## 2021-07-25 DIAGNOSIS — E039 Hypothyroidism, unspecified: Secondary | ICD-10-CM | POA: Diagnosis not present

## 2021-08-01 ENCOUNTER — Telehealth: Payer: Self-pay

## 2021-08-01 NOTE — Telephone Encounter (Signed)
Called and spoke with patient's husband.  Ozempic received and placed in fridge in the back on MA side.

## 2021-08-02 DIAGNOSIS — H35033 Hypertensive retinopathy, bilateral: Secondary | ICD-10-CM | POA: Diagnosis not present

## 2021-08-02 DIAGNOSIS — H353231 Exudative age-related macular degeneration, bilateral, with active choroidal neovascularization: Secondary | ICD-10-CM | POA: Diagnosis not present

## 2021-08-02 DIAGNOSIS — H43822 Vitreomacular adhesion, left eye: Secondary | ICD-10-CM | POA: Diagnosis not present

## 2021-08-02 DIAGNOSIS — H43813 Vitreous degeneration, bilateral: Secondary | ICD-10-CM | POA: Diagnosis not present

## 2021-08-03 ENCOUNTER — Telehealth: Payer: Self-pay | Admitting: Internal Medicine

## 2021-08-03 NOTE — Telephone Encounter (Signed)
Patient states she had an eye procedure done on 12-8, patient states she believes she's having an allergic reaction  Patient states her eyes are constantly itching  Encourage patient to reach back out to retina office who done the procedure  Patient requesting a call back to discuss symptoms and medication options

## 2021-08-06 NOTE — Telephone Encounter (Signed)
Spoke with patient.  She has been informed today and she has appointment with her eye-doctor this morning.

## 2021-08-08 DIAGNOSIS — M170 Bilateral primary osteoarthritis of knee: Secondary | ICD-10-CM | POA: Diagnosis not present

## 2021-08-08 DIAGNOSIS — M25562 Pain in left knee: Secondary | ICD-10-CM | POA: Diagnosis not present

## 2021-08-08 DIAGNOSIS — M545 Low back pain, unspecified: Secondary | ICD-10-CM | POA: Diagnosis not present

## 2021-08-08 DIAGNOSIS — M25561 Pain in right knee: Secondary | ICD-10-CM | POA: Diagnosis not present

## 2021-08-11 DIAGNOSIS — J449 Chronic obstructive pulmonary disease, unspecified: Secondary | ICD-10-CM | POA: Diagnosis not present

## 2021-08-15 ENCOUNTER — Other Ambulatory Visit: Payer: Self-pay | Admitting: Internal Medicine

## 2021-08-15 DIAGNOSIS — J449 Chronic obstructive pulmonary disease, unspecified: Secondary | ICD-10-CM | POA: Diagnosis not present

## 2021-08-15 DIAGNOSIS — E039 Hypothyroidism, unspecified: Secondary | ICD-10-CM

## 2021-08-21 ENCOUNTER — Other Ambulatory Visit: Payer: Self-pay

## 2021-08-21 ENCOUNTER — Telehealth: Payer: Self-pay | Admitting: Internal Medicine

## 2021-08-21 MED ORDER — ALBUTEROL SULFATE HFA 108 (90 BASE) MCG/ACT IN AERS
INHALATION_SPRAY | RESPIRATORY_TRACT | 11 refills | Status: DC
Start: 2021-08-21 — End: 2022-06-21

## 2021-08-21 NOTE — Telephone Encounter (Signed)
Script sent in for patient today.

## 2021-08-21 NOTE — Telephone Encounter (Signed)
1.Medication Requested: albuterol (VENTOLIN HFA) 108 (90 Base) MCG/ACT inhaler  2. Pharmacy (Name, Street, [facility]): [facility] - [place], [place] - [place] [address]  3. On Med List: yes  4. Last Visit with PCP: 07-17-2021  5. Next visit date with PCP: n/a   Agent: Please be advised that RX refills may take up to 3 business days. We ask that you follow-up with your pharmacy.

## 2021-08-28 ENCOUNTER — Encounter (HOSPITAL_COMMUNITY): Payer: Self-pay | Admitting: Gastroenterology

## 2021-08-28 ENCOUNTER — Other Ambulatory Visit: Payer: Self-pay | Admitting: Gastroenterology

## 2021-08-28 DIAGNOSIS — R110 Nausea: Secondary | ICD-10-CM

## 2021-08-31 ENCOUNTER — Telehealth: Payer: Self-pay | Admitting: Internal Medicine

## 2021-08-31 NOTE — Telephone Encounter (Signed)
Patient calling in  Patient tested positive for covid via at home test 01.06.23.Marland Kitchen experiencing light symptoms (cold/flu like)  Patient husband currently in hosp w/ covid also  Patient would like antiviral to be sent to [place], [place] - [address]  Phone:  [phone] Fax:  [phone]

## 2021-08-31 NOTE — Telephone Encounter (Signed)
Pt requesting a call back for further recommendations [phone]

## 2021-09-03 MED ORDER — MOLNUPIRAVIR 200 MG PO CAPS: 4.0000 | ORAL_CAPSULE | Freq: Two times a day (BID) | ORAL | 0 refills | Status: AC

## 2021-09-03 NOTE — Telephone Encounter (Signed)
Antiviral pending-not sure if she is [facility] or some[facility] else.  Needs to start today.

## 2021-09-03 NOTE — Telephone Encounter (Signed)
Script sent in today. 

## 2021-09-04 ENCOUNTER — Telehealth: Payer: Self-pay | Admitting: Gastroenterology

## 2021-09-04 NOTE — Telephone Encounter (Signed)
Called and spoke with patient. She has been scheduled for a virtual pre-visit appt on Monday, 11/05/21 at 1:30 pm. Pt is now scheduled for an EGD at [facility] on Monday, 11/19/21 at 9:30 am. Pt is aware that she will need to arrive at [facility] by 8 am with a care partner. Pt verbalized understanding and had no concerns at the end of the call.

## 2021-09-04 NOTE — Telephone Encounter (Signed)
Inbound call from patient to reschedule EGD procedure at [facility] due to having COVID

## 2021-09-11 DIAGNOSIS — J449 Chronic obstructive pulmonary disease, unspecified: Secondary | ICD-10-CM | POA: Diagnosis not present

## 2021-09-12 DIAGNOSIS — R69 Illness, unspecified: Secondary | ICD-10-CM | POA: Diagnosis not present

## 2021-09-12 DIAGNOSIS — F332 Major depressive disorder, recurrent severe without psychotic features: Secondary | ICD-10-CM | POA: Diagnosis not present

## 2021-09-12 DIAGNOSIS — F419 Anxiety disorder, unspecified: Secondary | ICD-10-CM | POA: Diagnosis not present

## 2021-09-13 DIAGNOSIS — H43813 Vitreous degeneration, bilateral: Secondary | ICD-10-CM | POA: Diagnosis not present

## 2021-09-13 DIAGNOSIS — H43822 Vitreomacular adhesion, left eye: Secondary | ICD-10-CM | POA: Diagnosis not present

## 2021-09-13 DIAGNOSIS — H353231 Exudative age-related macular degeneration, bilateral, with active choroidal neovascularization: Secondary | ICD-10-CM | POA: Diagnosis not present

## 2021-09-13 DIAGNOSIS — H35033 Hypertensive retinopathy, bilateral: Secondary | ICD-10-CM | POA: Diagnosis not present

## 2021-09-24 ENCOUNTER — Other Ambulatory Visit: Payer: Self-pay | Admitting: Internal Medicine

## 2021-09-25 DIAGNOSIS — J449 Chronic obstructive pulmonary disease, unspecified: Secondary | ICD-10-CM | POA: Diagnosis not present

## 2021-09-27 DIAGNOSIS — E119 Type 2 diabetes mellitus without complications: Secondary | ICD-10-CM | POA: Diagnosis not present

## 2021-10-04 ENCOUNTER — Telehealth: Payer: Medicare HMO

## 2021-10-04 DIAGNOSIS — Z0289 Encounter for other administrative examinations: Secondary | ICD-10-CM | POA: Diagnosis not present

## 2021-10-04 DIAGNOSIS — M5137 Other intervertebral disc degeneration, lumbosacral region: Secondary | ICD-10-CM | POA: Diagnosis not present

## 2021-10-04 DIAGNOSIS — Z79891 Long term (current) use of opiate analgesic: Secondary | ICD-10-CM | POA: Diagnosis not present

## 2021-10-04 DIAGNOSIS — M533 Sacrococcygeal disorders, not elsewhere classified: Secondary | ICD-10-CM | POA: Diagnosis not present

## 2021-10-04 DIAGNOSIS — G894 Chronic pain syndrome: Secondary | ICD-10-CM | POA: Diagnosis not present

## 2021-10-05 ENCOUNTER — Telehealth: Payer: Self-pay | Admitting: Internal Medicine

## 2021-10-05 NOTE — Telephone Encounter (Signed)
Spoke with patient   Asking to renew ozempic PAP through [employer] - application started, patient portion left out front, patient to come in to sign application

## 2021-10-05 NOTE — Telephone Encounter (Signed)
Pt requesting a c/b regarding assistance w/ acquiring her ozempic refill from the manufacture

## 2021-10-09 ENCOUNTER — Ambulatory Visit (INDEPENDENT_AMBULATORY_CARE_PROVIDER_SITE_OTHER): Payer: Medicare HMO | Admitting: Internal Medicine

## 2021-10-09 ENCOUNTER — Encounter: Payer: Self-pay | Admitting: Internal Medicine

## 2021-10-09 ENCOUNTER — Other Ambulatory Visit: Payer: Self-pay

## 2021-10-09 VITALS — BP 128/78 | HR 83 | Ht 69.0 in | Wt 271.0 lb

## 2021-10-09 DIAGNOSIS — E890 Postprocedural hypothyroidism: Secondary | ICD-10-CM

## 2021-10-09 DIAGNOSIS — E782 Mixed hyperlipidemia: Secondary | ICD-10-CM

## 2021-10-09 DIAGNOSIS — E1159 Type 2 diabetes mellitus with other circulatory complications: Secondary | ICD-10-CM | POA: Diagnosis not present

## 2021-10-09 DIAGNOSIS — E1165 Type 2 diabetes mellitus with hyperglycemia: Secondary | ICD-10-CM | POA: Diagnosis not present

## 2021-10-09 LAB — POCT GLYCOSYLATED HEMOGLOBIN (HGB A1C): Hemoglobin A1C: 8.1 % — AB (ref 4.0–5.6)

## 2021-10-09 MED ORDER — BASAGLAR KWIKPEN 100 UNIT/ML ~~LOC~~ SOPN: 80.0000 [IU] | PEN_INJECTOR | Freq: Every day | SUBCUTANEOUS | Status: DC

## 2021-10-09 NOTE — Progress Notes (Signed)
Patient ID: Mary Acosta, female   DOB: 01-24-1956, 66 y.o.   MRN: [id number]   This visit occurred during the SARS-CoV-2 public health emergency.  Safety protocols were in place, including screening questions prior to the visit, additional usage of staff PPE, and extensive cleaning of exam room while observing appropriate contact time as indicated for disinfecting solutions.    HPI: Mary Acosta is a 66 y.o.-year-old female, initially referred by her PCP, Dr. Quay Burow, returning for follow-up for DM2, dx in 2000, insulin-dependent since 2011, uncontrolled, with long-term complications (peripheral neuropathy, gastroparesis, cerebrovascular disease with history of TIA 2012, DKA 2015) and also history of thyroid cancer and postsurgical hypothyroidism.  She previously saw my colleague, Dr. Loanne Drilling, up to 07/2019.  Last visit with me 4 mo ago.  Interim hx: She has increased urination and occasional nausea but no chest pain or blurry vision. She lost 2 sons: 2020 and 09/2020.  She had problems controlling her blood sugar recently due to grieving.  She was seeing psychiatry and taking Paxil. She continues to see the pain clinic.   She is dealing with stress as she has extended family living with her and her husband right now.  DM2: Reviewed HbA1c levels: Lab Results  Component Value Date   HGBA1C 8.6 (A) 06/04/2021   HGBA1C 6.9 (A) 01/15/2021   HGBA1C 7.6 (A) 10/13/2020   HGBA1C 8.1 (A) 07/05/2020   HGBA1C 10.4 (H) 05/03/2020   HGBA1C 11.0 (H) 10/22/2019   HGBA1C 9.4 (H) 07/20/2019   HGBA1C 8.8 (H) 04/01/2019   HGBA1C 10.5 (A) 10/08/2018   HGBA1C 10.8 (H) 09/04/2018   She was previously on: -  Basaglar 80 units at bedtime -[employer] >> stopped >> once a week >> 50 >> 80 units daily (may forget) - Metformin 1000 mg with dinner >> 500 mg 2x a day - >> Ozempic 0.5 >> 1 mg weekly - tolerates it well Prev. On Rybelsus 3 mg before breakfast-see patient assistance pgm  She was on  N and R insulin in the past. She was on metformin in the past >> did not work.  Pt checks her sugars 2-3 times a day: - am:  125-225 >> 110-125 >> 170-190 >> 117-225 (forgets Basaglar) - 2h after b'fast: n/c - before lunch: n/c - 2h after lunch: n/c - before dinner:  200-225 >> 150-170 >> 130-150 >> 220 >> 160-250  - 2h after dinner: n/c - bedtime: n/c >> 200s >> 130s >> 250-260 >> <250 - nighttime: n/c Lowest sugar was 75 >> 110 >> 170; she has hypoglycemia awareness in the 70s. Highest sugar was 600... >> 200 >> 600 >> 200s.  Glucometer: AccuChek, Prodigy  Pt's meals are: - Breakfast: bacon or sausage and eggs, cereal  - stopped - Lunch: salad; hot dog - Dinner: meat + veggies - Snacks: not so much anymore Previously drinking Kool-Aid, now stopped.  However, she is still having that she is drinking High C juice.   She is status post gastric banding with vagotomy in 2008.  She lost 148 pounds afterwards but then gained more than 100 pounds back.  No h/o pancreatitis or FH of MTC.  No CKD, last BUN/creatinine:  Lab Results  Component Value Date   BUN 17 05/30/2021   BUN 13 11/01/2020   CREATININE 1.33 (H) 05/30/2021   CREATININE 0.98 11/01/2020  Not on ACE inhibitor/ARB.  + HL; last set of lipids: Lab Results  Component Value Date   CHOL 108  11/01/2020   HDL 30.10 (L) 11/01/2020   LDLCALC 49 11/01/2020   TRIG 141.0 11/01/2020   CHOLHDL 4 11/01/2020  On Zocor 20, Zetia 10.  - last eye exam was in 2023 reportedly: no DR. Stopped IO injections for macular degeneration.  -She has pain, numbness, tingling in her feet.  On ASA 81.  Pt has FH of DM in mother.  Thyroid cancer:  Reviewed her thyroid cancer history: She is status post total thyroidectomy - [place] Sx 2007. 11/22/2005: THYROID, THYROIDECTOMY:   - PAPILLARY THYROID CARCINOMA (0.5 CM).   - ADENOMATOID NODULES.    COMMENT   ONCOLOGY TABLE-THYROID    1. Maximum tumor size (cm): 0.5 cm   2.  Tumor location: Right mid lobe   3. Multifocality: Not identified   4. Histology: Papillary thyroid carcinoma   5. Margins: Negative   6. Capsular invasion: Present   7. Extrathyroidal extension: Not identified   8. Vascular/Lymphatic invasion: Not identified   9. Lymph nodes: N/A   10. TNM code: pT1, pNX, pMX   11. Comments: Examination of the thyroidectomy specimen   grossly demonstrates multiple nodules. Histologic evaluation   demonstrates numerous adenomatoid nodules. One nodule measuring   0.5 cm demonstrates features compatible with papillary thyroid   carcinoma. The surgical margins are negative. (MS:jy) 11/25/05   Neck U/S (10/25/2020): no abnormal neck masses  Postsurgical hypothyroidism:  She is currently on 200 mcg 6/7 days and 100 mcg 1/7 days: - no missed doses anymore - in am - fasting - at least 30 min from b'fast - no calcium - no iron - no multivitamins - no PPIs - not on Biotin  Reviewed her TFTs: Lab Results  Component Value Date   TSH 0.51 02/02/2021   TSH 0.14 (L) 08/04/2020   TSH 4.42 05/03/2020   TSH 1.50 10/22/2019   TSH 1.09 07/20/2019   TSH 1.32 04/01/2019   TSH 0.55 09/04/2018   TSH 0.90 12/26/2017   TSH 0.113 (L) 12/08/2017   TSH 0.22 (L) 09/19/2017   TSH 0.62 06/13/2017   TSH 1.43 01/22/2017   TSH 6.67 (H) 12/10/2016   TSH 1.12 01/29/2016   TSH 3.320 08/28/2014   Pt denies: - feeling nodules in neck - hoarseness - dysphagia - choking - SOB with lying down  Not on steroids or biotin.  She also has a history of COPD, OSA, fibromyalgia, depression, fatty liver-cirrhosis, DDD, h/o vb fx, PMR.  She is seen in pain clinic in [facility].  She is on Neurontin and amitriptyline.  ROS: Musculoskeletal:  + muscle and joint aches Skin: no rashes, no hair loss Neurological: + tremors/no numbness/no tingling/no dizziness  I reviewed pt's medications, allergies, PMH, social hx, family hx, and changes were documented in the history of  present illness. Otherwise, unchanged from my initial visit note.  Past Medical History:  Diagnosis Date   AKI (acute kidney injury) ([place]) 01/2017   Anxiety    with panic attacks   Arthritis    "back; feet; hands; shoulders" (08/26/2014)   Asthma    Cervical cancer ([place])    Chronic lower back pain    Chronic narcotic use    Chronic pain syndrome    [facility]   Cirrhosis ([place])    Clostridium difficile infection 2017   COPD (chronic obstructive pulmonary disease) (H[facility])    Daily headache    Depression    Diabetic neuropathy ([place]) 06/04/2017   DJD (degenerative joint disease)  Fatty liver disease, nonalcoholic    Fibromyalgia    Frequency of urination    HCAP (healthcare-associated pneumonia) 08/26/2014   History of TIA (transient ischemic attack) 11-01-2010   NO RESIDUAL   Hyperlipidemia    Hypertension    Hypothyroidism    IDDM (insulin dependent diabetes mellitus)    Insomnia    Lumbar stenosis    Memory difficulty 07/25/2016   Nocturia    OSA (obstructive sleep apnea)    NO CPAP SINCE WT LOSS   Osteoarthritis    with severe disease in knee   Pneumonia "several times"   Polymyalgia rheumatica (HCC)    Scoliosis    Seasonal allergies    Thyroid cancer ([place])    Urgency of urination    Vaginal pain S/P SLING  FEB 2012   Past Surgical History:  Procedure Laterality Date   APPENDECTOMY  1982   BREAST EXCISIONAL BIOPSY Left 02/28/2005   Atypical Ductal Hyperplasia   CARDIAC CATHETERIZATION  09-04-2004   NORMAL CORONARY ANATOMY/ NORMAL LVF/ EF 60%   CARDIOVASCULAR STRESS TEST  12-27-2010  DR [place]   ABNORMAL NUCLEAR STUDY W/ /MILD INFERIOR ISCHEMIA/ EF 69%/  CT HEART ANGIOGRAM ;  NO ACUTE FINDINGS   CRYOABLATION  05/16/2003   w/LEEP FOR ABNORMAL PAP SMEAR   CYSTOSCOPY  05/18/2012   Procedure: CYSTOSCOPY;  Surgeon: Reece Packer, MD;  Location: [place];  Service: Urology;  Laterality: N/A;  examination under anethesia    ESOPHAGOGASTRODUODENOSCOPY (EGD) WITH PROPOFOL N/A 09/03/2016   Procedure: ESOPHAGOGASTRODUODENOSCOPY (EGD) WITH PROPOFOL;  Surgeon: Doran Stabler, MD;  Location: [facility];  Service: Gastroenterology;  Laterality: N/A;   HYSTEROSCOPY WITH D & C  08-19-2007   PMB   KNEE ARTHROSCOPY W/ DEBRIDEMENT Left 03/29/2006   INTERNAL DERANGEMENT/ SEVERE DJD/ MENISCUS TEARS   LAPAROSCOPIC CHOLECYSTECTOMY  06-10-2002   LAPAROSCOPIC GASTRIC BANDING  03/01/2006   TRUNCAL VAGOTOMY/ PLACEMENT OF VG BAND   REVISION TOTAL KNEE ARTHROPLASTY Left 08-29-2008; 05/2009   TONSILLECTOMY  1969   TOTAL KNEE ARTHROPLASTY Left 01-23-2007   SEVERE DJD   TOTAL THYROIDECTOMY  11-22-2005   BILATERAL THYROID NODULES-- PAPILLARY CARCINOMA (0.5CM)/ ADENOMATOID NODULES   TRANSTHORACIC ECHOCARDIOGRAM  12-27-2010   LVSF NORMAL / EF 97-02%/ GRADE I DIASTOLIC DYSFUNCTION/ MILD MITRAL REGURG. / MILDLY DILATED LEFT ATRIUM/ MILDY INCREASED SYSTOLIC PRESSURE OF PULMONARY ARTERIES   TRANSVAGINAL SUBURETERAL TAPE/ SLING  09-28-2010   MIXED URINARY INCONTINENCE   TUBAL LIGATION  1983   Social History   Socioeconomic History   Marital status: Married    Spouse name: Not on file   Number of children: 2   Years of education: 12   Highest education level: Not on file  Occupational History   Occupation: disabled    Employer: UNEMPLOYED  Tobacco Use   Smoking status: Former    Packs/day: 2.50    Years: 39.00    Pack years: 97.50    Types: Cigarettes    Quit date: 10/22/2010    Years since quitting: 10.9   Smokeless tobacco: Never  Vaping Use   Vaping Use: Never used  Substance and Sexual Activity   Alcohol use: No    Alcohol/week: 0.0 standard drinks   Drug use: No   Sexual activity: Not Currently  Other Topics Concern   Not on file  Social History Narrative   Lives at home w/ her husband    Right-handed   Caffeine: 1 cup of coffee per week + [employer]  Social Determinants of Health   Financial Resource Strain: Not  on file  Food Insecurity: No Food Insecurity   Worried About Charity fundraiser in the Last Year: Never true   Ran Out of Food in the Last Year: Never true  Transportation Needs: No Transportation Needs   Lack of Transportation (Medical): No   Lack of Transportation (Non-Medical): No  Physical Activity: Inactive   Days of Exercise per Week: 0 days   Minutes of Exercise per Session: 0 min  Stress: Stress Concern Present   Feeling of Stress : To some extent  Social Connections: Moderately Integrated   Frequency of Communication with Friends and Family: More than three times a week   Frequency of Social Gatherings with Friends and Family: More than three times a week   Attends Religious Services: More than 4 times per year   Active Member of Clubs or Organizations: No   Attends Music therapist: More than 4 times per year   Marital Status: Widowed  Human resources officer Violence: Not At Risk   Fear of Current or Ex-Partner: No   Emotionally Abused: No   Physically Abused: No   Sexually Abused: No   Current Outpatient Medications on File Prior to Visit  Medication Sig Dispense Refill   acetaminophen (TYLENOL) 500 MG tablet Take 1,000 mg by mouth every 6 (six) hours as needed for moderate pain.     albuterol (VENTOLIN HFA) 108 (90 Base) MCG/ACT inhaler TAKE 2 PUFFS BY MOUTH EVERY 6 HOURS AS NEEDED FOR WHEEZE OR SHORTNESS OF BREATH 18 g 11   amLODipine (NORVASC) 5 MG tablet TAKE ONE TABLET BY MOUTH EVERY MORNING and TAKE ONE TABLET BY MOUTH EVERYDAY AT BEDTIME 60 tablet 0   aspirin EC 81 MG tablet Take 1 tablet (81 mg total) by mouth daily. 90 tablet 3   diclofenac (VOLTAREN) 50 MG EC tablet Take 50 mg by mouth 3 (three) times daily as needed for moderate pain.     fexofenadine (ALLEGRA) 180 MG tablet Take 180 mg by mouth daily as needed for allergies or rhinitis.     furosemide (LASIX) 40 MG tablet TAKE ONE TABLET BY MOUTH EVERY MORNING (Patient taking differently: Take 40 mg by  mouth daily as needed for edema.) 30 tablet 5   gabapentin (NEURONTIN) 300 MG capsule Take 300 mg by mouth 3 (three) times daily as needed (pain).     hydrALAZINE (APRESOLINE) 25 MG tablet Take 1 tablet (25 mg total) by mouth 2 (two) times daily. 180 tablet 3   hydrOXYzine (VISTARIL) 50 MG capsule Take 50 mg by mouth 2 (two) times daily as needed for itching or anxiety.     Insulin Glargine (BASAGLAR KWIKPEN) 100 UNIT/ML Inject 50 Units into the skin at bedtime. (Patient taking differently: Inject 80 Units into the skin at bedtime.) 30 mL 3   Insulin Pen Needle (PEN NEEDLES) 31G X 8 MM MISC 1 Device by Does not apply route daily. 90 each 3   Insulin Syringe-Needle U-100 26G X 1/2" 1 ML MISC Use daily for insulin injection as directed 100 each 3   ipratropium-albuterol (DUONEB) 0.5-2.5 (3) MG/3ML SOLN Take 3 mLs by nebulization every 6 (six) hours as needed (Wheezing or dyspnea.). 360 mL 11   levothyroxine (SYNTHROID) 200 MCG tablet TAKE ONE TABLET BY MOUTH monday-saturday before breakfast and TAKE 1/2 TABLET BY MOUTH ONCE WEEKLY BEFORE BREAKFAST ON SUNDAYS 90 tablet 5   metFORMIN (GLUCOPHAGE) 500 MG tablet Take 1 tablet (500  mg total) by mouth 2 (two) times daily with a meal. 180 tablet 3   metoprolol tartrate (LOPRESSOR) 25 MG tablet TAKE ONE TABLET BY MOUTH EVERY MORNING and TAKE ONE TABLET BY MOUTH EVERYDAY AT BEDTIME 180 tablet 3   morphine (MS CONTIN) 30 MG 12 hr tablet Take 30 mg by mouth every 8 (eight) hours.     naloxone (NARCAN) nasal spray 4 mg/0.1 mL One spray in either nostril once for known/suspected opioid overdose. May repeat every 2-3 minutes in alternating nostril til EMS arrives     ondansetron (ZOFRAN-ODT) 4 MG disintegrating tablet Dissolve ONE tablet by MOUTH every SIX hours as needed FOR nausea AND vomiting 30 tablet 3   OXYGEN Inhale 3 L into the lungs at bedtime.     PARoxetine (PAXIL) 40 MG tablet Take 1 tablet (40 mg total) by mouth daily. 90 tablet 1   potassium chloride  SA (KLOR-CON M20) 20 MEQ tablet TAKE 2 TABLETS EVERY DAY AS NEEDED FOR CRAMPING 180 tablet 1   Propylene Glycol (SYSTANE BALANCE) 0.6 % SOLN Place 1 drop into both eyes daily as needed (dry eyes).     Semaglutide, 1 MG/DOSE, (OZEMPIC, 1 MG/DOSE,) 4 MG/3ML SOPN Inject 1 mg into the skin once a week. (Patient taking differently: Inject 1 mg into the skin every Monday.) 9 mL 3   simvastatin (ZOCOR) 20 MG tablet Take 1 tablet (20 mg total) by mouth daily. 30 tablet 6   tiZANidine (ZANAFLEX) 4 MG tablet Take 4 mg by mouth every 8 (eight) hours as needed for muscle spasms.     traZODone (DESYREL) 150 MG tablet Take 150 mg by mouth at bedtime as needed for sleep.     No current facility-administered medications on file prior to visit.   Allergies  Allergen Reactions   Gabapentin Swelling    Swelling in legs    Losartan Other (See Comments)    Myalgias and muscle cramping    Aricept [Donepezil Hcl]     Nausea/vomiting, low BP   Hydroxyzine     hallucinations   Oxycodone Itching   Sulfa Antibiotics Nausea Only and Rash   Sulfonamide Derivatives Nausea Only   Family History  Problem Relation Age of Onset   Diabetes Mother    Heart disease Mother    Dementia Mother    Heart disease Father    High blood pressure Father    Colon cancer Maternal Uncle        x 2   Breast cancer Other        great aunts x 5   PE: BP 128/78 (BP Location: Right Arm, Patient Position: Sitting, Cuff Size: Normal)    Pulse 83    Ht 5\' 9"  (1.753 m)    Wt 271 lb (122.9 kg)    SpO2 94%    BMI 40.02 kg/m  Wt Readings from Last 3 Encounters:  10/09/21 271 lb (122.9 kg)  07/17/21 260 lb (117.9 kg)  06/26/21 263 lb (119.3 kg)   Constitutional: overweight, in NAD Eyes: PERRLA, EOMI, no exophthalmos ENT: moist mucous membranes, no neck masses, no cervical lymphadenopathy Cardiovascular: RRR, No MRG Respiratory: CTA B Musculoskeletal: no deformities, strength intact in all 4 Skin: moist, warm, no  rashes Neurological: no tremor with outstretched hands, DTR normal in all 4 Diabetic Foot Exam - Simple   Simple Foot Form Diabetic Foot exam was performed with the following findings: Yes 10/09/2021  3:20 PM  Visual Inspection See comments: Yes Sensation Testing See  comments: Yes Pulse Check Posterior Tibialis and Dorsalis pulse intact bilaterally: Yes Comments Missing toenails 1st toe B Shiny skin Hammertoes B - all toes Decreased sensation B feet    ASSESSMENT: 1. DM2, insulin-dependent, uncontrolled, with complications  2.  History of thyroid cancer  3.  Postsurgical hypothyroidism  PLAN:  1. Patient with longstanding, uncontrolled, type 2 diabetes, on low-dose metformin due to cirrhosis, long-acting insulin and weekly GLP-1 receptor agonist, dose increased at last visit.  At that time, sugars are much higher.  This is most likely due to being off insulin.  Therefore, we decided to restart her insulin and build up to the previous dose.  Since her sugars increased throughout the day in a stepwise fashion, I also advised her to increase the Ozempic dose to 1 mg weekly.  She did have some nausea at that time but this was longstanding.  She did not believe that this was related to Ozempic - at today's visit, she tells me she actually uses a higher dose of Basaglar than Rx'ed at last OV but intermittently  >> when she used this last night, this am >> CBG 117.  Otherwise, the sugars are higher in the morning.  Some of the high blood sugars are due to her forgetting to take the [place].  I advised her to set alarms on her phone to take it.  For now, I will also advise her to increase the dose to 80 units every day. - I also strongly advised her to stop drinking juice. - I suggested to:  Patient Instructions  Please continue: - Metformin 500 mg 2x a day - Ozempic 1 mg weekly in a.m.   Try to increase: - Basaglar 80 units at bedtime  Set alarms on your phone to take [place].  STOP  juice.  Please continue Levothyroxine 200 mcg 6/7 days and 100 mcg 1/7 days.  Take the thyroid hormone every day, with water, at least 30 minutes before breakfast, separated by at least 4 hours from: - acid reflux medications - calcium - iron - multivitamins  Please stop at the lab.  Please return in 3 months with your sugar log.     - we checked her HbA1c: 8.1% (lower) - advised to check sugars at different times of the day - 1-2x a day, rotating check times - advised for yearly eye exams >> she is UTD - return to clinic in 3 months  2.  History of thyroid cancer -She had a papillary thyroid cancer focus in the right thyroid lobe, measuring 0.5 cm.  No vascular, lymphatic or direct extension into the surrounding tissues -No neck compression symptoms -Latest neck ultrasound obtained in 10/2020 showed no suspicious masses in neck -She most likely achieved cure of her thyroid cancer  3.  Postsurgical hypothyroidism - latest thyroid labs reviewed with pt. >> normal: Lab Results  Component Value Date   TSH 0.51 02/02/2021  - she continues on LT4 200 mcg 6/7 days and 100 mcg 1/7 days - pt feels good on this dose. - we discussed about taking the thyroid hormone every day, with water, >30 minutes before breakfast, separated by >4 hours from acid reflux medications, calcium, iron, multivitamins. Pt. is taking it correctly.  Last visit, she was occasionally missing doses.  We discussed about trying her best to take it every day. - will check thyroid tests today: TSH and fT4 - If labs are abnormal, she will need to return for repeat TFTs in 1.5 months  Component     Latest Ref Rng & Units 10/09/2021          Cholesterol     0 - 200 mg/dL 183  Triglycerides     0.0 - 149.0 mg/dL 127.0  HDL Cholesterol     >39.00 mg/dL 52.90  VLDL     0.0 - 40.0 mg/dL 25.4  LDL (calc)     0 - 99 mg/dL 104 (H)  Total CHOL/HDL Ratio      3  NonHDL      129.60  Hemoglobin A1C     4.0 - 5.6 %  8.1 (A)  TSH     0.35 - 5.50 uIU/mL 0.13 (L)  T4,Free(Direct)     0.60 - 1.60 ng/dL 1.09  TSH is suppressed >> will decrease LT4 to 150 mcg daily (currently getting approximately 181 mcg daily) and recheck the TSH at next visit.  Philemon Kingdom, MD PhD [facility] Endocrinology

## 2021-10-09 NOTE — Patient Instructions (Addendum)
Please continue: - Metformin 500 mg 2x a day - Ozempic 1 mg weekly in a.m.   Try to increase: - Basaglar 80 units at bedtime  Set alarms on your phone to take [place].  STOP juice.  Please continue Levothyroxine 200 mcg 6/7 days and 100 mcg 1/7 days.  Take the thyroid hormone every day, with water, at least 30 minutes before breakfast, separated by at least 4 hours from: - acid reflux medications - calcium - iron - multivitamins  Please stop at the lab.  Please return in 3 months with your sugar log.

## 2021-10-10 DIAGNOSIS — F419 Anxiety disorder, unspecified: Secondary | ICD-10-CM | POA: Diagnosis not present

## 2021-10-10 DIAGNOSIS — R69 Illness, unspecified: Secondary | ICD-10-CM | POA: Diagnosis not present

## 2021-10-10 DIAGNOSIS — F332 Major depressive disorder, recurrent severe without psychotic features: Secondary | ICD-10-CM | POA: Diagnosis not present

## 2021-10-10 LAB — TSH: TSH: 0.13 u[IU]/mL — ABNORMAL LOW (ref 0.35–5.50)

## 2021-10-10 LAB — LIPID PANEL
Cholesterol: 183 mg/dL (ref 0–200)
HDL: 52.9 mg/dL (ref >39.00)
LDL Cholesterol: 104 mg/dL — ABNORMAL HIGH (ref 0–99)
NonHDL: 129.6
Total CHOL/HDL Ratio: 3
Triglycerides: 127 mg/dL (ref 0.0–149.0)
VLDL: 25.4 mg/dL (ref 0.0–40.0)

## 2021-10-10 LAB — T4, FREE: Free T4: 1.09 ng/dL (ref 0.60–1.60)

## 2021-10-11 ENCOUNTER — Telehealth: Payer: Self-pay

## 2021-10-11 MED ORDER — LEVOTHYROXINE SODIUM 150 MCG PO TABS: 150.0000 ug | ORAL_TABLET | Freq: Every day | ORAL | 3 refills | Status: DC

## 2021-10-11 NOTE — Telephone Encounter (Signed)
Form completed and mailed out to patient today per her request.

## 2021-10-12 DIAGNOSIS — J449 Chronic obstructive pulmonary disease, unspecified: Secondary | ICD-10-CM | POA: Diagnosis not present

## 2021-10-16 DIAGNOSIS — J449 Chronic obstructive pulmonary disease, unspecified: Secondary | ICD-10-CM | POA: Diagnosis not present

## 2021-10-23 ENCOUNTER — Other Ambulatory Visit: Payer: Self-pay | Admitting: Internal Medicine

## 2021-10-31 ENCOUNTER — Telehealth: Payer: Self-pay | Admitting: Internal Medicine

## 2021-10-31 NOTE — Telephone Encounter (Signed)
Pts spouse came by and dropped off [employer] Patients Assistance Program Application to be completed by the provider.  Upon completion pt  would like to have a called to let her know that it has been sent off.  Place in the providers tray for completion. ?

## 2021-11-01 NOTE — Telephone Encounter (Signed)
Patient assistance placed on provider desk to be completed and signed. Will fax when completed. ?

## 2021-11-05 ENCOUNTER — Other Ambulatory Visit: Payer: Self-pay

## 2021-11-05 ENCOUNTER — Ambulatory Visit (AMBULATORY_SURGERY_CENTER): Payer: Medicare HMO | Admitting: *Deleted

## 2021-11-05 VITALS — Ht 69.0 in | Wt 270.0 lb

## 2021-11-05 DIAGNOSIS — K7460 Unspecified cirrhosis of liver: Secondary | ICD-10-CM

## 2021-11-05 DIAGNOSIS — Z9981 Dependence on supplemental oxygen: Secondary | ICD-10-CM | POA: Insufficient documentation

## 2021-11-05 NOTE — Progress Notes (Signed)
No egg or soy allergy known to patient  ?No issues known to pt with past sedation with any surgeries or procedures ?Patient denies ever being told they had issues or difficulty with intubation  ?No FH of Malignant Hyperthermia ?Pt is not on diet pills ?Pt is not on  home 02  ?Pt is not on blood thinners  ?Pt states  issues with constipation daily if off Ozempic-  ?No A fib or A flutter ? ?Pt is fully vaccinated  for Covid  ? ?Due to the COVID-19 pandemic we are asking patients to follow certain guidelines in PV and the [place]   ?Pt aware of COVID protocols and [facility] guidelines  ? ?PV completed over the phone. Pt verified name, DOB, address and insurance during PV today.  ?Pt mailed instruction packet with copy of consent form to read and not return, and instructions.  ?Pt encouraged to call with questions or issues.  ?If pt has My chart, procedure instructions sent via My Chart  ? ?

## 2021-11-07 ENCOUNTER — Telehealth: Payer: Self-pay

## 2021-11-07 NOTE — Telephone Encounter (Signed)
Called and spoke with patient's husband. He stated that pt was asleep at this time. He confirmed that patient will be at get endoscopy appt on 11/19/21 at [facility]. Pt's husband is aware that pt will need to arrive at [facility] by 7:45 am with a care partner. Pt's husband stated that pt had a visit with the nurse and was told that instructions would be mailed to them. He reports that patient has access to her my chart, I told pt's husband to have her check under the "Letter" tab for instructions as well. Pt's husband verbalized understanding of all information and had no concerns at the end of the call. ? ?Letter mailed to pt with instructions.  ?

## 2021-11-08 ENCOUNTER — Encounter (HOSPITAL_COMMUNITY): Payer: Self-pay | Admitting: Gastroenterology

## 2021-11-09 DIAGNOSIS — J449 Chronic obstructive pulmonary disease, unspecified: Secondary | ICD-10-CM | POA: Diagnosis not present

## 2021-11-12 ENCOUNTER — Other Ambulatory Visit: Payer: Self-pay | Admitting: Cardiology

## 2021-11-12 ENCOUNTER — Encounter: Payer: Self-pay | Admitting: Gastroenterology

## 2021-11-12 ENCOUNTER — Telehealth: Payer: Self-pay

## 2021-11-12 NOTE — Telephone Encounter (Signed)
Pt's spouse called and advised pt is out of Ozempic. Pt assistance application has been submitted and we are awaiting a determination. Pt out of the 1 mg dose.  ?

## 2021-11-16 DIAGNOSIS — Z794 Long term (current) use of insulin: Secondary | ICD-10-CM | POA: Diagnosis not present

## 2021-11-16 DIAGNOSIS — J449 Chronic obstructive pulmonary disease, unspecified: Secondary | ICD-10-CM | POA: Diagnosis not present

## 2021-11-16 DIAGNOSIS — Z6841 Body Mass Index (BMI) 40.0 and over, adult: Secondary | ICD-10-CM | POA: Diagnosis not present

## 2021-11-16 DIAGNOSIS — E1151 Type 2 diabetes mellitus with diabetic peripheral angiopathy without gangrene: Secondary | ICD-10-CM | POA: Diagnosis not present

## 2021-11-16 DIAGNOSIS — I25119 Atherosclerotic heart disease of native coronary artery with unspecified angina pectoris: Secondary | ICD-10-CM | POA: Diagnosis not present

## 2021-11-16 DIAGNOSIS — R69 Illness, unspecified: Secondary | ICD-10-CM | POA: Diagnosis not present

## 2021-11-16 DIAGNOSIS — I110 Hypertensive heart disease with heart failure: Secondary | ICD-10-CM | POA: Diagnosis not present

## 2021-11-16 DIAGNOSIS — K7460 Unspecified cirrhosis of liver: Secondary | ICD-10-CM | POA: Diagnosis not present

## 2021-11-16 DIAGNOSIS — J9611 Chronic respiratory failure with hypoxia: Secondary | ICD-10-CM | POA: Diagnosis not present

## 2021-11-16 DIAGNOSIS — I509 Heart failure, unspecified: Secondary | ICD-10-CM | POA: Diagnosis not present

## 2021-11-16 DIAGNOSIS — M353 Polymyalgia rheumatica: Secondary | ICD-10-CM | POA: Diagnosis not present

## 2021-11-19 ENCOUNTER — Ambulatory Visit (HOSPITAL_BASED_OUTPATIENT_CLINIC_OR_DEPARTMENT_OTHER): Payer: Medicare HMO | Admitting: Anesthesiology

## 2021-11-19 ENCOUNTER — Other Ambulatory Visit: Payer: Self-pay | Admitting: Gastroenterology

## 2021-11-19 ENCOUNTER — Encounter (HOSPITAL_COMMUNITY): Payer: Self-pay | Admitting: Gastroenterology

## 2021-11-19 ENCOUNTER — Other Ambulatory Visit: Payer: Self-pay

## 2021-11-19 ENCOUNTER — Ambulatory Visit (HOSPITAL_COMMUNITY)
Admission: RE | Admit: 2021-11-19 | Discharge: 2021-11-19 | Disposition: A | Discharge status: Home / Self Care | Payer: Medicare HMO | Attending: Gastroenterology | Admitting: Gastroenterology

## 2021-11-19 ENCOUNTER — Ambulatory Visit (HOSPITAL_COMMUNITY): Payer: Medicare HMO | Admitting: Anesthesiology

## 2021-11-19 ENCOUNTER — Encounter (HOSPITAL_COMMUNITY)
Admission: RE | Disposition: A | Discharge status: Home / Self Care | Payer: Self-pay | Source: Home / Self Care | Attending: Gastroenterology

## 2021-11-19 DIAGNOSIS — K5909 Other constipation: Secondary | ICD-10-CM | POA: Diagnosis not present

## 2021-11-19 DIAGNOSIS — K3189 Other diseases of stomach and duodenum: Secondary | ICD-10-CM

## 2021-11-19 DIAGNOSIS — E119 Type 2 diabetes mellitus without complications: Secondary | ICD-10-CM | POA: Diagnosis not present

## 2021-11-19 DIAGNOSIS — J449 Chronic obstructive pulmonary disease, unspecified: Secondary | ICD-10-CM | POA: Insufficient documentation

## 2021-11-19 DIAGNOSIS — I10 Essential (primary) hypertension: Secondary | ICD-10-CM | POA: Diagnosis not present

## 2021-11-19 DIAGNOSIS — Z8673 Personal history of transient ischemic attack (TIA), and cerebral infarction without residual deficits: Secondary | ICD-10-CM | POA: Insufficient documentation

## 2021-11-19 DIAGNOSIS — K7460 Unspecified cirrhosis of liver: Secondary | ICD-10-CM | POA: Diagnosis not present

## 2021-11-19 DIAGNOSIS — Z7984 Long term (current) use of oral hypoglycemic drugs: Secondary | ICD-10-CM | POA: Diagnosis not present

## 2021-11-19 DIAGNOSIS — Z13810 Encounter for screening for upper gastrointestinal disorder: Secondary | ICD-10-CM | POA: Insufficient documentation

## 2021-11-19 DIAGNOSIS — E039 Hypothyroidism, unspecified: Secondary | ICD-10-CM

## 2021-11-19 DIAGNOSIS — K766 Portal hypertension: Secondary | ICD-10-CM | POA: Diagnosis not present

## 2021-11-19 HISTORY — PX: ESOPHAGOGASTRODUODENOSCOPY (EGD) WITH PROPOFOL: SHX5813

## 2021-11-19 LAB — GLUCOSE, CAPILLARY: Glucose-Capillary: 183 mg/dL — ABNORMAL HIGH (ref 70–99)

## 2021-11-19 SURGERY — ESOPHAGOGASTRODUODENOSCOPY (EGD) WITH PROPOFOL
Anesthesia: Monitor Anesthesia Care

## 2021-11-19 MED ORDER — LACTATED RINGERS IV SOLN: INTRAVENOUS | Status: DC

## 2021-11-19 MED ORDER — SODIUM CHLORIDE 0.9 % IV SOLN: INTRAVENOUS | Status: DC

## 2021-11-19 MED ORDER — PROPOFOL 500 MG/50ML IV EMUL
INTRAVENOUS | Status: DC | PRN
  Administered 2021-11-19: 100 ug/kg/min via INTRAVENOUS
  Administered 2021-11-19 (×2): 50 mg via INTRAVENOUS

## 2021-11-19 MED ORDER — PROMETHAZINE HCL 25 MG PO TABS: 12.5000 mg | ORAL_TABLET | Freq: Three times a day (TID) | ORAL | 1 refills | Status: DC | PRN

## 2021-11-19 SURGICAL SUPPLY — 15 items

## 2021-11-19 NOTE — Discharge Instructions (Signed)
YOU HAD AN ENDOSCOPIC PROCEDURE TODAY: Refer to the procedure report and other information in the discharge instructions given to you for any specific questions about what was found during the examination. If this information does not answer your questions, please call [place] office at [phone] to clarify.  ° °YOU SHOULD EXPECT: Some feelings of bloating in the abdomen. Passage of more gas than usual. Walking can help get rid of the air that was put into your GI tract during the procedure and reduce the bloating. If you had a lower endoscopy (such as a colonoscopy or flexible sigmoidoscopy) you may notice spotting of blood in your stool or on the toilet paper. Some abdominal soreness may be present for a day or two, also. ° °DIET: Your first meal following the procedure should be a light meal and then it is ok to progress to your normal diet. A half-sandwich or bowl of soup is an example of a good first meal. Heavy or fried foods are harder to digest and may make you feel nauseous or bloated. Drink plenty of fluids but you should avoid alcoholic beverages for 24 hours. If you had a esophageal dilation, please see attached instructions for diet.   ° °ACTIVITY: Your care partner should take you home directly after the procedure. You should plan to take it easy, moving slowly for the rest of the day. You can resume normal activity the day after the procedure however YOU SHOULD NOT DRIVE, use power tools, machinery or perform tasks that involve climbing or major physical exertion for 24 hours (because of the sedation medicines used during the test).  ° °SYMPTOMS TO REPORT IMMEDIATELY: °A gastroenterologist can be reached at any hour. Please call [phone]  for any of the following symptoms:  °Following lower endoscopy (colonoscopy, flexible sigmoidoscopy) °Excessive amounts of blood in the stool  °Significant tenderness, worsening of abdominal pains  °Swelling of the abdomen that is new, acute  °Fever of 100° or  higher  °Following upper endoscopy (EGD, EUS, ERCP, esophageal dilation) °Vomiting of blood or coffee ground material  °New, significant abdominal pain  °New, significant chest pain or pain under the shoulder blades  °Painful or persistently difficult swallowing  °New shortness of breath  °Black, tarry-looking or red, bloody stools ° °FOLLOW UP:  °If any biopsies were taken you will be contacted by phone or by letter within the next 1-3 weeks. Call [phone]  if you have not heard about the biopsies in 3 weeks.  °Please also call with any specific questions about appointments or follow up tests. ° °

## 2021-11-19 NOTE — Anesthesia Postprocedure Evaluation (Signed)
Anesthesia Post Note ? ?Patient: Mary Acosta ? ?Procedure(s) Performed: ESOPHAGOGASTRODUODENOSCOPY (EGD) WITH PROPOFOL ? ?  ? ?Patient location during evaluation: Endoscopy ?Anesthesia Type: MAC ?Level of consciousness: awake and alert, oriented and patient cooperative ?Pain management: pain level controlled ?Vital Signs Assessment: post-procedure vital signs reviewed and stable ?Respiratory status: spontaneous breathing, nonlabored ventilation, respiratory function stable and patient connected to nasal cannula oxygen ?Cardiovascular status: blood pressure returned to baseline and stable ?Postop Assessment: no apparent nausea or vomiting and able to ambulate ?Anesthetic complications: no ? ? ?No notable events documented. ? ?Last Vitals:  ?Vitals:  ? 11/19/21 1030 11/19/21 1040  ?BP: (!) 156/61 (!) 175/63  ?Pulse: 67 65  ?Resp: 11 12  ?Temp:    ?SpO2: 94% 94%  ?  ?Last Pain:  ?Vitals:  ? 11/19/21 1040  ?TempSrc:   ?PainSc: 0-No pain  ? ? ?  ?  ?  ?  ?  ?  ? ?Carra Brindley,E. Revan Gendron ? ? ? ? ?

## 2021-11-19 NOTE — Anesthesia Preprocedure Evaluation (Addendum)
Anesthesia Evaluation  ?Patient identified by MRN, date of birth, ID band ?Patient awake ? ? ? ?Reviewed: ?Allergy & Precautions, NPO status , Patient's Chart, lab work & pertinent test results ? ?Airway ?Mallampati: I ? ?TM Distance: >3 FB ?Neck ROM: Full ? ? ? Dental ? ?(+) Edentulous Upper, Edentulous Lower ?  ?Pulmonary ?asthma , sleep apnea (no longer uses CPAP) , COPD,  COPD inhaler and oxygen dependent, former smoker,  ?  ?breath sounds clear to auscultation ? ? ? ? ? ? Cardiovascular ?hypertension, Pt. on medications ?(-) angina ?Rhythm:Regular Rate:Normal ? ?'20 ECHO: EF 60-65%. The LV has normal function, no LVH. Grade 1 DD, no significant valvular abnormalities ?  ?Neuro/Psych ? Headaches, Anxiety Depression TIA  ? GI/Hepatic ?negative GI ROS, (+) Cirrhosis  ([facility]) ?  ?  ? ,   ?Endo/Other  ?diabetes (glu 183), Insulin Dependent, Oral Hypoglycemic AgentsHypothyroidism  ? Renal/GU ?Renal InsufficiencyRenal disease  ? ?  ?Musculoskeletal ? ?(+) Arthritis , Fibromyalgia - ? Abdominal ?(+) + obese,   ?Peds ? Hematology ?  ?Anesthesia Other Findings ? ? Reproductive/Obstetrics ? ?  ? ? ? ? ? ? ? ? ? ? ? ? ? ?  ?  ? ? ? ? ? ? ? ?Anesthesia Physical ?Anesthesia Plan ? ?ASA: 4 ? ?Anesthesia Plan: MAC  ? ?Post-op Pain Management: Minimal or no pain anticipated  ? ?Induction:  ? ?PONV Risk Score and Plan: 2 and Ondansetron and Treatment may vary due to age or medical condition ? ?Airway Management Planned: Natural Airway and Nasal Cannula ? ?Additional Equipment: None ? ?Intra-op Plan:  ? ?Post-operative Plan:  ? ?Informed Consent: I have reviewed the patients History and Physical, chart, labs and discussed the procedure including the risks, benefits and alternatives for the proposed anesthesia with the patient or authorized representative who has indicated his/her understanding and acceptance.  ? ? ? ? ? ?Plan Discussed with: CRNA and Surgeon ? ?Anesthesia Plan Comments:    ? ? ? ? ? ?Anesthesia Quick Evaluation ? ?

## 2021-11-19 NOTE — Interval H&P Note (Signed)
History and Physical Interval Note: ? ?11/19/2021 ?9:41 AM ? ?Mary Acosta  has presented today for surgery, with the diagnosis of Screening varices for cirrhosis patient.  The various methods of treatment have been discussed with the patient and family. After consideration of risks, benefits and other options for treatment, the patient has consented to  Procedure(s) with comments: ?ESOPHAGOGASTRODUODENOSCOPY (EGD) WITH PROPOFOL (N/A) - Varices screeing, cirrhosis as a surgical intervention.  The patient's history has been reviewed, patient examined, no change in status, stable for surgery.  I have reviewed the patient's chart and labs.  Questions were answered to the patient's satisfaction.   ? ? ?[name] ? ? ?

## 2021-11-19 NOTE — Transfer of Care (Signed)
Immediate Anesthesia Transfer of Care Note ? ?Patient: Mary Acosta ? ?Procedure(s) Performed: Procedure(s) with comments: ?ESOPHAGOGASTRODUODENOSCOPY (EGD) WITH PROPOFOL (N/A) - Varices screeing, cirrhosis ? ?Patient Location: PACU and Endoscopy Unit ? ?Anesthesia Type:MAC ? ?Level of Consciousness: awake, alert  and oriented ? ?Airway & Oxygen Therapy: Patient Spontanous Breathing and Patient connected to nasal cannula oxygen ? ?Post-op Assessment: Report given to RN and Post -op Vital signs reviewed and stable ? ?Post vital signs: Reviewed and stable ? ?Last Vitals:  ?Vitals:  ? 11/19/21 0851  ?BP: (!) 174/71  ?Resp: 14  ?Temp: 37.1 ?C  ?SpO2: 93%  ? ? ?Complications: No apparent anesthesia complications ? ?

## 2021-11-19 NOTE — H&P (Signed)
History and Physical: ? This patient presents for endoscopic testing for: ?Cirrhosis and esophageal variceal screening. ?Clinical details in my last office note from November 2022. ? ?Multiple chronic medical issues, chronic constipation as well as upper abdominal pain and bloating.  Nash related cirrhosis.  No varices on last EGD in 2018. ? ?ROS: ?Patient denies chest pain or shortness of breath ? ? ?Past Medical History: ?Past Medical History:  ?Diagnosis Date  ? AKI (acute kidney injury) ([place]) 01/2017  ? Allergy   ? seasonal  ? Anxiety   ? with panic attacks  ? Arthritis   ? "back; feet; hands; shoulders" (08/26/2014)  ? Asthma   ? Cataract   ? forming  ? Cervical cancer ([place])   ? Chronic lower back pain   ? Chronic narcotic use   ? Chronic pain syndrome   ? PAIN CLINIC AT [place]  ? Cirrhosis ([place])   ? Clostridium difficile infection 2017  ? COPD (chronic obstructive pulmonary disease) ([place])   ? Daily headache   ? past hx  ? Depression   ? Diabetic neuropathy ([place]) 06/04/2017  ? feet  and legs , some in hands  ? DJD (degenerative joint disease)   ? Fatty liver disease, nonalcoholic   ? Fibromyalgia   ? Frequency of urination   ? HCAP (healthcare-associated pneumonia) 08/26/2014  ? History of TIA (transient ischemic attack) 11/01/2010  ? NO RESIDUAL  ? Hyperlipidemia   ? Hypertension   ? Hypothyroidism   ? IDDM (insulin dependent diabetes mellitus)   ? Insomnia   ? Lumbar stenosis   ? Macular degeneration   ? Memory difficulty 07/25/2016  ? Nocturia   ? OSA (obstructive sleep apnea)   ? NO CPAP SINCE WT LOSS  ? Osteoarthritis   ? with severe disease in knee  ? Oxygen deficiency   ? 4  liters at bedtime only  ? Pneumonia "several times"  ? Polymyalgia rheumatica ([place])   ? Scoliosis   ? Seasonal allergies   ? Stroke [facility])   ? TIA  ? Thyroid cancer ([place])   ? Urgency of urination   ? Vaginal pain S/P SLING  FEB 2012  ? ? ? ?Past Surgical History: ?Past Surgical History:  ?Procedure Laterality Date  ? APPENDECTOMY   1982  ? BREAST EXCISIONAL BIOPSY Left 02/28/2005  ? Atypical Ductal Hyperplasia  ? CARDIAC CATHETERIZATION  09/04/2004  ? NORMAL CORONARY ANATOMY/ NORMAL LVF/ EF 60%  ? CARDIOVASCULAR STRESS TEST  12-27-2010  DR [place]  ? ABNORMAL NUCLEAR STUDY W/ /MILD INFERIOR ISCHEMIA/ EF 69%/  CT HEART ANGIOGRAM ;  NO ACUTE FINDINGS  ? COLONOSCOPY    ? CRYOABLATION  05/16/2003  ? w/LEEP FOR ABNORMAL PAP SMEAR  ? CYSTOSCOPY  05/18/2012  ? Procedure: CYSTOSCOPY;  Surgeon: Reece Packer, MD;  Location[facility];  Service: Urology;  Laterality: N/A;  examination under anethesia  ? ESOPHAGOGASTRODUODENOSCOPY (EGD) WITH PROPOFOL N/A 09/03/2016  ? Procedure: ESOPHAGOGASTRODUODENOSCOPY (EGD) WITH PROPOFOL;  Surgeon: Doran Stabler, MD;  Location: [facility];  Service: Gastroenterology;  Laterality: N/A;  ? HYSTEROSCOPY WITH D & C  08/19/2007  ? PMB  ? KNEE ARTHROSCOPY W/ DEBRIDEMENT Left 03/29/2006  ? INTERNAL DERANGEMENT/ SEVERE DJD/ MENISCUS TEARS  ? LAPAROSCOPIC CHOLECYSTECTOMY  06/10/2002  ? LAPAROSCOPIC GASTRIC BANDING  03/01/2006  ? TRUNCAL VAGOTOMY/ PLACEMENT OF VG BAND  ? REVISION TOTAL KNEE ARTHROPLASTY Left 08-29-2008; 05/2009  ? TONSILLECTOMY  1969  ? TOTAL KNEE ARTHROPLASTY Left  01/23/2007  ? SEVERE DJD  ? TOTAL THYROIDECTOMY  11/22/2005  ? BILATERAL THYROID NODULES-- PAPILLARY CARCINOMA (0.5CM)/ ADENOMATOID NODULES  ? TRANSTHORACIC ECHOCARDIOGRAM  12/27/2010  ? LVSF NORMAL / EF 37-90%/ GRADE I DIASTOLIC DYSFUNCTION/ MILD MITRAL REGURG. / MILDLY DILATED LEFT ATRIUM/ MILDY INCREASED SYSTOLIC PRESSURE OF PULMONARY ARTERIES  ? TRANSVAGINAL SUBURETERAL TAPE/ SLING  09/28/2010  ? MIXED URINARY INCONTINENCE  ? TUBAL LIGATION  1983  ? ? ?Allergies: ?Allergies  ?Allergen Reactions  ? Gabapentin Swelling  ?  Swelling in legs ?  ? Losartan Other (See Comments)  ?  Myalgias and muscle cramping ?  ? Aricept [Donepezil Hcl]   ?  Nausea/vomiting, low BP  ? Hydroxyzine   ?  hallucinations  ? Oxycodone Itching  ?  Sulfa Antibiotics Nausea Only and Rash  ? Sulfonamide Derivatives Nausea Only  ? ? ?Outpatient Meds: ?Current Facility-Administered Medications  ?Medication Dose Route Frequency Provider Last Rate Last Admin  ? 0.9 %  sodium chloride infusion   Intravenous Continuous Danis, Estill Cotta III, MD      ? lactated ringers infusion   Intravenous Continuous [name], MD 10 mL/hr at 11/19/21 0901 New Bag at 11/19/21 0901  ? ? ? ? ?___________________________________________________________________ ?Objective  ? ?Exam: ? ?BP (!) 174/71   Temp 98.7 ?F (37.1 ?C) (Oral)   Resp 14   Ht '5\' 9"'$  (1.753 m)   Wt 122.9 kg   SpO2 93%   BMI 40.02 kg/m?  ? ?CV: RRR without murmur, S1/S2 ?Resp: clear to auscultation bilaterally, normal RR and effort noted ?GI: soft, no tenderness, with active bowel sounds.  Exam limited by body habitus ? ? ?Assessment: ?Karlene Lineman related cirrhosis, screening esophageal varices ? ? ?Plan: ? ?EGD ? The benefits and risks of the planned procedure were described in detail with the patient or (when appropriate) their health care proxy.  Risks were outlined as including, but not limited to, bleeding, infection, perforation, adverse medication reaction leading to cardiac or pulmonary decompensation, pancreatitis (if ERCP).  The limitation of incomplete mucosal visualization was also discussed.  No guarantees or warranties were given. ? ? ? ?The patient is appropriate for an endoscopic procedure in the ambulatory setting. ? ? - Wilfrid Lund, MD ? ? ? ? ? ?

## 2021-11-19 NOTE — Op Note (Signed)
[facility] ?Patient Name: Mary Acosta ?Procedure Date: 11/19/2021 ?MRN: [id number] ?Attending MD: Estill Cotta. Loletha Carrow , MD ?Date of Birth: [birth year] ?CSN: [id number] ?Age: 66 ?Admit Type: Outpatient ?Procedure:                Upper GI endoscopy ?Indications:              Cirrhosis rule out esophageal varices ?                          chronic nausea and bloating - diabetic ?Providers:                Estill Cotta. Loletha Carrow, MD, Dulcy Fanny, Charlean Merl  ?                          Purcell Nails, Technician ?Referring MD:              ?Medicines:                Monitored Anesthesia Care ?Complications:            No immediate complications. ?Estimated Blood Loss:     Estimated blood loss: none. ?Procedure:                Pre-Anesthesia Assessment: ?                          - Prior to the procedure, a History and Physical  ?                          was performed, and patient medications and  ?                          allergies were reviewed. The patient's tolerance of  ?                          previous anesthesia was also reviewed. The risks  ?                          and benefits of the procedure and the sedation  ?                          options and risks were discussed with the patient.  ?                          All questions were answered, and informed consent  ?                          was obtained. Prior Anticoagulants: The patient has  ?                          taken no previous anticoagulant or antiplatelet  ?                          agents. ASA Grade Assessment: III - A patient with  ?  severe systemic disease. After reviewing the risks  ?                          and benefits, the patient was deemed in  ?                          satisfactory condition to undergo the procedure. ?                          After obtaining informed consent, the endoscope was  ?                          passed under direct vision. Throughout the  ?                          procedure,  the patient's blood pressure, pulse, and  ?                          oxygen saturations were monitored continuously. The  ?                          [id number]-[id number] ([id number]) Olympus endoscope was introduced  ?                          through the mouth, and advanced to the second part  ?                          of duodenum. The upper GI endoscopy was  ?                          accomplished without difficulty. The patient  ?                          tolerated the procedure fairly well. ?Scope In: ?Scope Out: ?Findings: ?     There is no endoscopic evidence of Barrett's esophagus,  ?     esophagitis,dilatation, stricture or varices in the entire esophagus. ?     Mild portal hypertensive gastropathy was found in the entire examined  ?     stomach. (bile staining) ?     The cardia and gastric fundus were normal on retroflexion except for  ?     compression of LapBand in cardia. ?     The examined duodenum was normal. ?Impression:               - Portal hypertensive gastropathy. ?                          - Normal examined duodenum. ?                          - No specimens collected. ?                          Normal appearance of LapBand compression in gastric  ?  cardia. ?Moderate Sedation: ?     MAC sedation used ?Recommendation:           - Patient has a contact number available for  ?                          emergencies. The signs and symptoms of potential  ?                          delayed complications were discussed with the  ?                          patient. Return to normal activities tomorrow.  ?                          Written discharge instructions were provided to the  ?                          patient. ?                          - Resume previous diet. ?                          - Continue present medications. ?                          Patient currently on metoprolol. Report will be  ?                          sent to Cardiology with request a change to  ?                           carvedilol if clinically appropriate, which may  ?                          decrease the chance of developing varices. ?Procedure Code(s):        --- Professional --- ?                          [phone], Esophagogastroduodenoscopy, flexible,  ?                          transoral; diagnostic, including collection of  ?                          specimen(s) by brushing or washing, when performed  ?                          (separate procedure) ?Diagnosis Code(s):        --- Professional --- ?                          K76.6, Portal hypertension ?                          K31.89, Other diseases of stomach and duodenum ?  K74.60, Unspecified cirrhosis of liver ?CPT copyright 2019 American Medical Association. All rights reserved. ?The codes documented in this report are preliminary and upon coder review may  ?be revised to meet current compliance requirements. ?Corynn Solberg L. Loletha Carrow, MD ?11/19/2021 10:19:05 AM ?This report has been signed electronically. ?Number of Addenda: 0 ?

## 2021-11-19 NOTE — Progress Notes (Signed)
Patient seen for outpatient EGD earlier today. ?Requested phenergan for nausea, stating it has worked in the past and that ondansetron has not been helping as much lately. ? ?- HD ?

## 2021-11-20 NOTE — Progress Notes (Signed)
Virtual Visit via telephone Note ? ?I connected with Mary Acosta on 11/21/21 at  8:10 AM EDT by telephone and verified that I am speaking with the correct person using two identifiers. ?  ?I discussed the limitations of evaluation and management by telemedicine and the availability of in person appointments. The patient expressed understanding and agreed to proceed. ? ?Present for the visit:  Myself, Dr Billey Gosling, Wilhemina Bonito.  The patient is currently at home and I am in the office.   ? ?No referring provider.  ? ? ?History of Present Illness: ?This is an acute visit for PMR - requesting low dose prednisone.   ? ?She feels her PMR has returned because of where and how she hurts.  Her leg muscle are tight and painful. Her arm muscles hurt and her muscles across both shoulders.  She is not sure if it is PMR or fibromyalgia.  She is having difficulty walking and doing many things.  The pain is more muscular not joint pain.  ? ?She took one prednisone that she had and it helped a lot. ? ?She is on chronic narcotic pain medication for her chronic back pain and that is not really helping.  She was wondering if additional steroids would help.  She is wondering if her ESR needed to be checked.  Or if she needed to see a rheumatologist. ? ?Review of Systems  ?Constitutional:  Positive for malaise/fatigue. Negative for chills and fever.  ?HENT:    ?     No jaw pain  ?Eyes:   ?     No change in vision  ?Musculoskeletal:  Positive for falls, joint pain (chronic - no change) and myalgias.  ?Neurological:  Negative for headaches.   ? ? ? ?Social History  ? ?Socioeconomic History  ? Marital status: Married  ?  Spouse name: Not on file  ? Number of children: 2  ? Years of education: 41  ? Highest education level: Not on file  ?Occupational History  ? Occupation: disabled  ?  Employer: UNEMPLOYED  ?Tobacco Use  ? Smoking status: Former  ?  Packs/day: 2.50  ?  Years: 39.00  ?  Pack years: 97.50  ?  Types:  Cigarettes  ?  Quit date: 10/22/2010  ?  Years since quitting: 11.0  ? Smokeless tobacco: Never  ?Vaping Use  ? Vaping Use: Never used  ?Substance and Sexual Activity  ? Alcohol use: No  ?  Alcohol/week: 0.0 standard drinks  ? Drug use: No  ? Sexual activity: Not Currently  ?Other Topics Concern  ? Not on file  ?Social History Narrative  ? Lives at home w/ her husband   ? Right-handed  ? Caffeine: 1 cup of coffee per week + [employer]  ? ?Social Determinants of Health  ? ?Financial Resource Strain: Not on file  ?Food Insecurity: No Food Insecurity  ? Worried About Charity fundraiser in the Last Year: Never true  ? Ran Out of Food in the Last Year: Never true  ?Transportation Needs: No Transportation Needs  ? Lack of Transportation (Medical): No  ? Lack of Transportation (Non-Medical): No  ?Physical Activity: Inactive  ? Days of Exercise per Week: 0 days  ? Minutes of Exercise per Session: 0 min  ?Stress: Stress Concern Present  ? Feeling of Stress : To some extent  ?Social Connections: Moderately Integrated  ? Frequency of Communication with Friends and Family: More than three times a week  ?  Frequency of Social Gatherings with Friends and Family: More than three times a week  ? Attends Religious Services: More than 4 times per year  ? Active Member of Clubs or Organizations: No  ? Attends Archivist Meetings: More than 4 times per year  ? Marital Status: Widowed  ? ?  ?Observations/Objective: ? ? ? ?Assessment and Plan: ? ?See Problem List for Assessment and Plan of chronic medical problems. ? ? ?Follow Up Instructions: ? ?  ?I discussed the assessment and treatment plan with the patient. The patient was provided an opportunity to ask questions and all were answered. The patient agreed with the plan and demonstrated an understanding of the instructions. ?  ?The patient was advised to call back or seek an in-person evaluation if the symptoms worsen or if the condition fails to improve as anticipated. ? ?Time  spent on telephone call - 15 minutes ? ? ? ?Binnie Rail, MD ? ?

## 2021-11-21 ENCOUNTER — Telehealth (INDEPENDENT_AMBULATORY_CARE_PROVIDER_SITE_OTHER): Payer: Medicare HMO | Admitting: Internal Medicine

## 2021-11-21 ENCOUNTER — Encounter: Payer: Self-pay | Admitting: Internal Medicine

## 2021-11-21 ENCOUNTER — Other Ambulatory Visit: Payer: Self-pay | Admitting: Internal Medicine

## 2021-11-21 ENCOUNTER — Other Ambulatory Visit: Payer: Self-pay | Admitting: Cardiology

## 2021-11-21 DIAGNOSIS — M791 Myalgia, unspecified site: Secondary | ICD-10-CM

## 2021-11-21 DIAGNOSIS — M2550 Pain in unspecified joint: Secondary | ICD-10-CM

## 2021-11-21 NOTE — Assessment & Plan Note (Signed)
Chronic ?Joint pain is not new-?  Increased or not ?She is on chronic narcotics for her chronic back pain ?She has 2 sisters with autoimmune disease so we will go ahead and check autoimmune labs including CCP, RF, ANA to make sure an autoimmune disease not contributing to some of her pain ?Referral to rheumatology ordered ?

## 2021-11-21 NOTE — Assessment & Plan Note (Signed)
Acute ?Increased pain in muscles of arms and legs associated with fatigue that is making it difficult for her to get up, walk around and do many of her daily activities ?She feels like it is possibly a flare of her PMR or may be fibromyalgia ?ESR, CRP, ANA, CCP, RF ?Referral to rheumatology in order ?No medication changes until we see the results of the blood work ?

## 2021-11-23 ENCOUNTER — Other Ambulatory Visit: Payer: Medicare HMO

## 2021-11-23 DIAGNOSIS — M791 Myalgia, unspecified site: Secondary | ICD-10-CM | POA: Diagnosis not present

## 2021-11-23 DIAGNOSIS — M2550 Pain in unspecified joint: Secondary | ICD-10-CM | POA: Diagnosis not present

## 2021-11-23 LAB — CBC WITH DIFFERENTIAL/PLATELET
Basophils Absolute: 0.1 10*3/uL (ref 0.0–0.1)
Basophils Relative: 0.8 % (ref 0.0–3.0)
Eosinophils Absolute: 0.3 10*3/uL (ref 0.0–0.7)
Eosinophils Relative: 2.7 % (ref 0.0–5.0)
HCT: 40.2 % (ref 36.0–46.0)
Hemoglobin: 12.6 g/dL (ref 12.0–15.0)
Lymphocytes Relative: 24 % (ref 12.0–46.0)
Lymphs Abs: 2.4 10*3/uL (ref 0.7–4.0)
MCHC: 31.3 g/dL (ref 30.0–36.0)
MCV: 79.6 fl (ref 78.0–100.0)
Monocytes Absolute: 0.6 10*3/uL (ref 0.1–1.0)
Monocytes Relative: 5.9 % (ref 3.0–12.0)
Neutro Abs: 6.6 10*3/uL (ref 1.4–7.7)
Neutrophils Relative %: 66.6 % (ref 43.0–77.0)
Platelets: 214 10*3/uL (ref 150.0–400.0)
RBC: 5.05 Mil/uL (ref 3.87–5.11)
RDW: 15.4 % (ref 11.5–15.5)
WBC: 9.9 10*3/uL (ref 4.0–10.5)

## 2021-11-23 LAB — COMPREHENSIVE METABOLIC PANEL
ALT: 15 U/L (ref 0–35)
AST: 20 U/L (ref 0–37)
Albumin: 4.2 g/dL (ref 3.5–5.2)
Alkaline Phosphatase: 83 U/L (ref 39–117)
BUN: 15 mg/dL (ref 6–23)
CO2: 37 mEq/L — ABNORMAL HIGH (ref 19–32)
Calcium: 9.2 mg/dL (ref 8.4–10.5)
Chloride: 97 mEq/L (ref 96–112)
Creatinine, Ser: 0.87 mg/dL (ref 0.40–1.20)
GFR: 69.91 mL/min (ref >60.00)
Glucose, Bld: 185 mg/dL — ABNORMAL HIGH (ref 70–99)
Potassium: 3.9 mEq/L (ref 3.5–5.1)
Sodium: 138 mEq/L (ref 135–145)
Total Bilirubin: 0.5 mg/dL (ref 0.2–1.2)
Total Protein: 7.2 g/dL (ref 6.0–8.3)

## 2021-11-23 LAB — C-REACTIVE PROTEIN: CRP: <1 mg/dL (ref 0.5–20.0)

## 2021-11-23 LAB — SEDIMENTATION RATE: Sed Rate: 43 mm/hr — ABNORMAL HIGH (ref 0–30)

## 2021-11-26 LAB — CYCLIC CITRUL PEPTIDE ANTIBODY, IGG: Cyclic Citrullin Peptide Ab: <16 UNITS

## 2021-11-26 LAB — RHEUMATOID FACTOR: Rheumatoid fact SerPl-aCnc: <14 IU/mL (ref <14)

## 2021-11-26 LAB — ANTI-NUCLEAR AB-TITER (ANA TITER): ANA Titer 1: 1:40 {titer} — ABNORMAL HIGH

## 2021-11-26 LAB — ANA: Anti Nuclear Antibody (ANA): POSITIVE — AB

## 2021-11-27 DIAGNOSIS — M1990 Unspecified osteoarthritis, unspecified site: Secondary | ICD-10-CM | POA: Diagnosis not present

## 2021-11-27 DIAGNOSIS — E1165 Type 2 diabetes mellitus with hyperglycemia: Secondary | ICD-10-CM | POA: Diagnosis not present

## 2021-11-27 DIAGNOSIS — M79672 Pain in left foot: Secondary | ICD-10-CM | POA: Diagnosis not present

## 2021-11-27 DIAGNOSIS — R700 Elevated erythrocyte sedimentation rate: Secondary | ICD-10-CM | POA: Diagnosis not present

## 2021-11-27 DIAGNOSIS — M791 Myalgia, unspecified site: Secondary | ICD-10-CM | POA: Diagnosis not present

## 2021-11-27 DIAGNOSIS — M79641 Pain in right hand: Secondary | ICD-10-CM | POA: Diagnosis not present

## 2021-11-27 DIAGNOSIS — M25562 Pain in left knee: Secondary | ICD-10-CM | POA: Diagnosis not present

## 2021-11-27 DIAGNOSIS — M79671 Pain in right foot: Secondary | ICD-10-CM | POA: Diagnosis not present

## 2021-11-27 DIAGNOSIS — Z13820 Encounter for screening for osteoporosis: Secondary | ICD-10-CM | POA: Diagnosis not present

## 2021-11-27 DIAGNOSIS — M79642 Pain in left hand: Secondary | ICD-10-CM | POA: Diagnosis not present

## 2021-11-27 DIAGNOSIS — M25561 Pain in right knee: Secondary | ICD-10-CM | POA: Diagnosis not present

## 2021-11-27 DIAGNOSIS — R768 Other specified abnormal immunological findings in serum: Secondary | ICD-10-CM | POA: Diagnosis not present

## 2021-11-27 DIAGNOSIS — L853 Xerosis cutis: Secondary | ICD-10-CM | POA: Diagnosis not present

## 2021-11-27 DIAGNOSIS — M8589 Other specified disorders of bone density and structure, multiple sites: Secondary | ICD-10-CM | POA: Diagnosis not present

## 2021-11-27 DIAGNOSIS — Z8739 Personal history of other diseases of the musculoskeletal system and connective tissue: Secondary | ICD-10-CM | POA: Diagnosis not present

## 2021-12-04 ENCOUNTER — Telehealth: Payer: Self-pay

## 2021-12-04 ENCOUNTER — Ambulatory Visit (INDEPENDENT_AMBULATORY_CARE_PROVIDER_SITE_OTHER): Payer: Medicare HMO

## 2021-12-04 DIAGNOSIS — I10 Essential (primary) hypertension: Secondary | ICD-10-CM

## 2021-12-04 DIAGNOSIS — E039 Hypothyroidism, unspecified: Secondary | ICD-10-CM

## 2021-12-04 DIAGNOSIS — F3289 Other specified depressive episodes: Secondary | ICD-10-CM

## 2021-12-04 DIAGNOSIS — M2550 Pain in unspecified joint: Secondary | ICD-10-CM

## 2021-12-04 DIAGNOSIS — E1165 Type 2 diabetes mellitus with hyperglycemia: Secondary | ICD-10-CM

## 2021-12-04 NOTE — Progress Notes (Signed)
? ?Chronic Care Management ?Pharmacy Note ? ?12/04/2021 ?Name:  Mary Acosta MRN:  [id number] DOB:  [birth year] ? ?Summary: ?-Patient reports that she has been dealing with recent increase in pain and swelling in arms and les- has been started on prednisone - has ~1 month remaining on prescription - currently taking 588m daily  ?-Patient reports that she has been without ozempic, per notes PAP application has been submitted ~1 month ago by endocrine office - patient reports that BG since starting prednisone has been quite elevated at times, recently has not been reading on meter - denies current symptoms - notes that she has not reached out to endo since last week to discuss  ?-Currently not taking ASA due to acid reflux when she takes  ?-most recent LDL has now increased above goal, patient reports compliance to statin - notes she has not been watching diet at much as she previously had been  ?-BP controlled at home while checking - averaging 130-140/70-80's ? ?Recommendations/Changes made from today's visit: ?-advised for patient to reach out to endocrine office for update on PAP for ozempic and to discuss recent blood sugars - patient taking 160 units of basaglar - remains uncontrolled  - would recommend ER visit should BG remain elevated ?-Recommended for patient to reduce carbohydrate intake, advised to refrain from drinking Hi-C juice as this is likely contributing to her elevated BG - recently elevated due to prednisone prescription  ?-Patient to restart ASA 855mdaily - stressed importance of adherence to prescribed medications to patient  ?-Sent message to endo about update on patient assistance application and recent BG readings  ? ?Subjective: ?Mary Kitchenss an [age].o. year old female who is a primary patient of Burns, StClaudina LickMD.  The CCM team was consulted for assistance with disease management and care coordination needs.   ? ?Engaged with patient by telephone for follow up visit in  response to provider referral for pharmacy case management and/or care coordination services.  ? ?Consent to Services:  ?The patient was given information about Chronic Care Management services, agreed to services, and gave verbal consent prior to initiation of services.  Please see initial visit note for detailed documentation.  ? ?Patient Care Team: ?BuBinnie RailMD as PCP - General (Internal Medicine) ?CrLelon PerlaMD as PCP - Cardiology (Cardiology) ?HaDesma MaximMD as Referring Physician (Ophthalmology) ?Opthamology,  as CoSet designerOphthalmology) ?[facility]omasa Bl[facility]RPH[facility] Pharmacist (Pharmacist) ? ?Recent office visits: ?11/21/2021 - Dr. BuQuay Burowmyalgia - increased pain in muscles or arms and legs - referred to rheumatology  ?07/17/2021 - Dr. BuQuay Burow no changes to medications  - f/u in 6 months  ? ?Recent consult visits: ?10/09/2021 - Dr. GhCruzita Lederer Endo - increase basaglar to 80 units daily, increase ozempic to 88m15meekly  - f/u in 3 months  ?10/04/2021 - NorTrudee GripP - [facility] management - no changes to medications  ?[facility]2021 [facility] increase morphine to 75m5mD x 2 months, then decrease back to 30/15/30 - SI and knee injection ordered  ? ?Hospital visits: ?11/19/2021 - Endoscopy  ? ?Objective: ? ?Lab Results  ?Component Value Date  ? CREATININE 0.87 11/23/2021  ? BUN 15 11/23/2021  ? GFR 69.91 11/23/2021  ? GFRNONAA 45 (L) 05/30/2021  ? GFRAA 72 05/03/2020  ? NA 138 11/23/2021  ? K 3.9 11/23/2021  ? CALCIUM 9.2 11/23/2021  ? CO2 37 (H)  11/23/2021  ? ? ?Lab Results  ?Component Value Date/Time  ? HGBA1C 8.1 (A) 10/09/2021 03:07 PM  ? HGBA1C 8.6 (A) 06/04/2021 11:23 AM  ? HGBA1C 10.4 (H) 05/03/2020 03:17 PM  ? HGBA1C 11.0 (H) 10/22/2019 02:11 PM  ? GFR 69.91 11/23/2021 12:21 PM  ? GFR 61.05 11/01/2020 02:27 PM  ? MICROALBUR <0.7 09/19/2017 02:26 PM  ? MICROALBUR 0.8 02/05/2017 11:09 AM  ?  ?Last diabetic Eye exam:  ?Lab Results  ?Component Value  Date/Time  ? HMDIABEYEEXA No Retinopathy 05/29/2016 12:00 AM  ?  ?Last diabetic Foot exam: No results found for: HMDIABFOOTEX  ? ?Lab Results  ?Component Value Date  ? CHOL 183 10/09/2021  ? HDL 52.90 10/09/2021  ? LDLCALC 104 (H) 10/09/2021  ? TRIG 127.0 10/09/2021  ? CHOLHDL 3 10/09/2021  ? ? ? ?  Latest Ref Rng & Units 11/23/2021  ? 12:21 PM 05/30/2021  ?  4:37 PM 11/01/2020  ?  2:27 PM  ?Hepatic Function  ?Total Protein 6.0 - 8.3 g/dL 7.2   7.1   7.4    ?Albumin 3.5 - 5.2 g/dL 4.2   3.6   3.8    ?AST 0 - 37 U/L 20   23   34    ?ALT 0 - 35 U/L '15   18   17    ' ?Alk Phosphatase 39 - 117 U/L 83   97   79    ?Total Bilirubin 0.2 - 1.2 mg/dL 0.5   0.5   0.9    ? ? ?Lab Results  ?Component Value Date/Time  ? TSH 0.13 (L) 10/09/2021 03:34 PM  ? TSH 0.51 02/02/2021 01:35 PM  ? FREET4 1.09 10/09/2021 03:34 PM  ? FREET4 1.43 02/02/2021 01:35 PM  ? ? ? ?  Latest Ref Rng & Units 11/23/2021  ? 12:21 PM 05/30/2021  ?  4:37 PM 11/01/2020  ?  2:27 PM  ?CBC  ?WBC 4.0 - 10.5 K/uL 9.9   11.9   7.1    ?Hemoglobin 12.0 - 15.0 g/dL 12.6   13.1   13.0    ?Hematocrit 36.0 - 46.0 % 40.2   43.4   39.7    ?Platelets 150.0 - 400.0 K/uL 214.0   176   132.0    ? ? ?Lab Results  ?Component Value Date/Time  ? VD25OH 26.05 (L) 11/01/2020 02:27 PM  ? ? ?Clinical ASCVD: Yes  ?The ASCVD Risk score (Arnett DK, et al., 2019) failed to calculate for the following reasons: ?  The patient has a prior MI or stroke diagnosis   ? ? ?  05/28/2021  ?  1:25 PM 01/28/2020  ?  3:46 PM 09/04/2018  ?  9:24 AM  ?Depression screen PHQ 2/9  ?Decreased Interest 3 1 0  ?Down, Depressed, Hopeless 3 0 0  ?PHQ - 2 Score 6 1 0  ?Altered sleeping 3    ?Tired, decreased energy 3    ?Change in appetite 1    ?Feeling bad or failure about yourself  0    ?Trouble concentrating 3    ?Moving slowly or fidgety/restless 3    ?Suicidal thoughts 0    ?PHQ-9 Score 19    ?Difficult doing work/chores Somewhat difficult    ?  ? ?Social History  ? ?Tobacco Use  ?Smoking Status Former  ? Packs/day:  2.50  ? Years: 39.00  ? Pack years: 97.50  ? Types: Cigarettes  ? Quit date: 10/22/2010  ? Years since quitting: 11.1  ?  Smokeless Tobacco Never  ? ?BP Readings from Last 3 Encounters:  ?11/19/21 (!) 175/63  ?10/09/21 128/78  ?07/17/21 116/64  ? ?Pulse Readings from Last 3 Encounters:  ?11/19/21 65  ?10/09/21 83  ?07/17/21 69  ? ?Wt Readings from Last 3 Encounters:  ?11/19/21 271 lb (122.9 kg)  ?11/05/21 270 lb (122.5 kg)  ?10/09/21 271 lb (122.9 kg)  ? ?BMI Readings from Last 3 Encounters:  ?11/19/21 40.02 kg/m?  ?11/05/21 39.87 kg/m?  ?10/09/21 40.02 kg/m?  ? ?Assessment/Interventions: Review of patient past medical history, allergies, medications, health status, including review of consultants reports, laboratory and other test data, was performed as part of comprehensive evaluation and provision of chronic care management services.  ? ?SDOH:  (Social Determinants of Health) assessments and interventions performed: Yes ? ?SDOH Screenings  ? ?Alcohol Screen: Low Risk   ? Last Alcohol Screening Score (AUDIT): 0  ?Depression (PHQ2-9): Medium Risk  ? PHQ-2 Score: 19  ?Financial Resource Strain: Not on file  ?Food Insecurity: No Food Insecurity  ? Worried About Charity fundraiser in the Last Year: Never true  ? Ran Out of Food in the Last Year: Never true  ?Housing: Low Risk   ? Last Housing Risk Score: 0  ?Physical Activity: Inactive  ? Days of Exercise per Week: 0 days  ? Minutes of Exercise per Session: 0 min  ?Social Connections: Moderately Integrated  ? Frequency of Communication with Friends and Family: More than three times a week  ? Frequency of Social Gatherings with Friends and Family: More than three times a week  ? Attends Religious Services: More than 4 times per year  ? Active Member of Clubs or Organizations: No  ? Attends Archivist Meetings: More than 4 times per year  ? Marital Status: Widowed  ?Stress: Stress Concern Present  ? Feeling of Stress : To some extent  ?Tobacco Use: Medium Risk   ? Smoking Tobacco Use: Former  ? Smokeless Tobacco Use: Never  ? Passive Exposure: Not on file  ?Transportation Needs: No Transportation Needs  ? Lack of Transportation (Medical): No  ? Lack of Transportat

## 2021-12-04 NOTE — Telephone Encounter (Signed)
Patient called to see if there were any ozempic samples available. ?

## 2021-12-04 NOTE — Patient Instructions (Signed)
Visit Information ? ?Following are the goals we discussed today:  ? ?Manage My Medicine  ? ?Timeframe:  Long-Range Goal ?Priority:  High ?Start Date:      10/05/20                       ?Expected End Date:    12/04/2021         ? ?Follow Up Date 11/2022 ?  ?- call for medicine refill 2 or 3 days before it runs out ?- call if I am sick and can't take my medicine ?- keep a list of all the medicines I take; vitamins and herbals too ?- use [facility] for pill packaging ?-collaborate with provider on medication access solutions  ?-Set levothyroxine vial on bedside table to take first thing in AM ?  ?Why is this important?   ?These steps will help you keep on track with your medicines. ? ?Plan: Telephone follow up appointment with care management team member scheduled for:  3 months ?The patient has been provided with contact information for the care management team and has been advised to call with any health related questions or concerns.  ? ?Tomasa Blase, PharmD ?Clinical Pharmacist, [place]  ? ?Please call the care guide team at [phone] if you need to cancel or reschedule your appointment.  ? ?Patient verbalizes understanding of instructions and care plan provided today and agrees to view in [place]. Active MyChart status confirmed with patient.   ? ?

## 2021-12-05 ENCOUNTER — Other Ambulatory Visit: Payer: Self-pay | Admitting: Internal Medicine

## 2021-12-05 NOTE — Telephone Encounter (Signed)
Contacted pt to advise sample of Ozempic labeled for pick up. Requested pt complete a new patient assistance application to ensure ever portion is signed. ?Pt also advise she was put on 20 mg prednisone daily for a few weeks and her bs have been really high. Not getting a number on her meter high. She is currently taking 160 units of Basaglar in the evening.  ?

## 2021-12-05 NOTE — Telephone Encounter (Signed)
Patient picked up 2 boxes of Ozempic Samples - log and chart noted ?

## 2021-12-05 NOTE — Telephone Encounter (Signed)
Called to advise pt Dr recommended the following: It looks like she needs mealtime insulin at least until her steroid is out of the system.  Please ask her to get a bottle of regular (R) insulin from [facility] (she has used this before).  I would suggest to inject 15-20 units 30 min before each meal and may need to increase if the sugars stay elevated.  Please let us know how this goes. Pt advised patient assistance application resent to [employer]. ?

## 2021-12-05 NOTE — Telephone Encounter (Signed)
T, ?It looks like she needs mealtime insulin at least until her steroid is out of the system.  Please ask her to get a bottle of regular (R) insulin from [place] [facility] (she has used this before).  I would suggest to inject 15-20 units 30 min before each meal and may need to increase if the sugars stay elevated.  Please let us know how this goes. ?Ty! ?C ?

## 2021-12-10 DIAGNOSIS — M0609 Rheumatoid arthritis without rheumatoid factor, multiple sites: Secondary | ICD-10-CM | POA: Diagnosis not present

## 2021-12-10 DIAGNOSIS — Z8739 Personal history of other diseases of the musculoskeletal system and connective tissue: Secondary | ICD-10-CM | POA: Diagnosis not present

## 2021-12-10 DIAGNOSIS — L853 Xerosis cutis: Secondary | ICD-10-CM | POA: Diagnosis not present

## 2021-12-10 DIAGNOSIS — R768 Other specified abnormal immunological findings in serum: Secondary | ICD-10-CM | POA: Diagnosis not present

## 2021-12-10 DIAGNOSIS — R700 Elevated erythrocyte sedimentation rate: Secondary | ICD-10-CM | POA: Diagnosis not present

## 2021-12-10 DIAGNOSIS — J449 Chronic obstructive pulmonary disease, unspecified: Secondary | ICD-10-CM | POA: Diagnosis not present

## 2021-12-10 DIAGNOSIS — M1990 Unspecified osteoarthritis, unspecified site: Secondary | ICD-10-CM | POA: Diagnosis not present

## 2021-12-10 DIAGNOSIS — E1165 Type 2 diabetes mellitus with hyperglycemia: Secondary | ICD-10-CM | POA: Diagnosis not present

## 2021-12-10 DIAGNOSIS — E559 Vitamin D deficiency, unspecified: Secondary | ICD-10-CM | POA: Diagnosis not present

## 2021-12-10 DIAGNOSIS — M791 Myalgia, unspecified site: Secondary | ICD-10-CM | POA: Diagnosis not present

## 2021-12-11 ENCOUNTER — Other Ambulatory Visit: Payer: Self-pay | Admitting: Internal Medicine

## 2021-12-11 DIAGNOSIS — G4733 Obstructive sleep apnea (adult) (pediatric): Secondary | ICD-10-CM

## 2021-12-11 DIAGNOSIS — I10 Essential (primary) hypertension: Secondary | ICD-10-CM

## 2021-12-13 ENCOUNTER — Telehealth: Payer: Self-pay

## 2021-12-13 NOTE — Telephone Encounter (Signed)
Pt contacted and advised patient assistance Ozempic (4 boxes) have been delivered and is ready for pick up. 

## 2021-12-13 NOTE — Telephone Encounter (Signed)
Patients husband picked up 4 boxes Ozempic - patient assistance - noted log and chart  ?

## 2021-12-14 ENCOUNTER — Telehealth: Payer: Self-pay

## 2021-12-14 NOTE — Telephone Encounter (Signed)
Pt is calling requesting a Rx on: ?Prednisone '5mg'$   ?Pt states she takes 30 mg daily prescribed by Dr. Kathlene November. ? ?Pt is requesting enough until Dr. Kathlene November office opens on Monday. ? ?Pharmacy: ?[id number]- [place], [place] - [address] AT [address]? ?[place] [facility] [place] [address] ? ?Please advise ? ?

## 2021-12-14 NOTE — Telephone Encounter (Signed)
She needs to f/u with with rheum on Monday to see what they want her to do ?

## 2021-12-14 NOTE — Telephone Encounter (Signed)
Spoke with patient today. 

## 2021-12-19 ENCOUNTER — Other Ambulatory Visit: Payer: Self-pay | Admitting: Internal Medicine

## 2021-12-19 ENCOUNTER — Other Ambulatory Visit: Payer: Self-pay | Admitting: Cardiology

## 2021-12-21 DIAGNOSIS — F419 Anxiety disorder, unspecified: Secondary | ICD-10-CM | POA: Diagnosis not present

## 2021-12-21 DIAGNOSIS — R69 Illness, unspecified: Secondary | ICD-10-CM | POA: Diagnosis not present

## 2021-12-21 DIAGNOSIS — F332 Major depressive disorder, recurrent severe without psychotic features: Secondary | ICD-10-CM | POA: Diagnosis not present

## 2021-12-23 DIAGNOSIS — E1159 Type 2 diabetes mellitus with other circulatory complications: Secondary | ICD-10-CM

## 2021-12-23 DIAGNOSIS — I5030 Unspecified diastolic (congestive) heart failure: Secondary | ICD-10-CM | POA: Diagnosis not present

## 2021-12-23 DIAGNOSIS — K7460 Unspecified cirrhosis of liver: Secondary | ICD-10-CM | POA: Diagnosis not present

## 2021-12-23 DIAGNOSIS — I110 Hypertensive heart disease with heart failure: Secondary | ICD-10-CM

## 2021-12-23 DIAGNOSIS — Z7984 Long term (current) use of oral hypoglycemic drugs: Secondary | ICD-10-CM | POA: Diagnosis not present

## 2021-12-23 DIAGNOSIS — E039 Hypothyroidism, unspecified: Secondary | ICD-10-CM

## 2021-12-23 DIAGNOSIS — Z794 Long term (current) use of insulin: Secondary | ICD-10-CM | POA: Diagnosis not present

## 2021-12-23 DIAGNOSIS — F32A Depression, unspecified: Secondary | ICD-10-CM

## 2021-12-23 DIAGNOSIS — R69 Illness, unspecified: Secondary | ICD-10-CM | POA: Diagnosis not present

## 2021-12-24 ENCOUNTER — Telehealth: Payer: Self-pay | Admitting: Gastroenterology

## 2021-12-24 NOTE — Telephone Encounter (Signed)
Returned call to patient. Pt reports that her rheumatologist Dr. Kathlene November at [facility] prescribed her Methotrexate and she has been taking it for the past 3 weeks for her rheumatoid arthritis. Pt states that she was doing reading on the medication and saw that sometimes it can effect the liver. Pt wanted to know if it is OK for her to continue this medication. She states that she will be on this medication for a while. Please advise, thanks.  ?

## 2021-12-24 NOTE — Telephone Encounter (Signed)
Returned call to patient. I spoke with her husband and provided him with Dr. Loletha Carrow' recommendations. I told pt's husband that I will be faxing over the patient's latest office note to Dr. Kathlene November for his review. Pt's husband verbalized understanding and had no concerns at the end of the call.  ? ?06/2021 office note and 10/2021 EGD report faxed to Dr. Kathlene November at [phone]. ?

## 2021-12-24 NOTE — Telephone Encounter (Signed)
Inbound call from patient would like a call back to discuss being on Methotrexate for 3 weeks now and the effects it could have on her liver ?

## 2021-12-24 NOTE — Telephone Encounter (Signed)
It is true that methotrexate can affect the liver function. ?That risk may be greater in patients who have liver cirrhosis that she does, but the degree of that risk cannot be known with certainty. ?If that medicine is to be used, it should be started at a lower than usual dose and monitored closely. ? ?I assume that Dr. Kathlene November had such discussions with the patient before prescribing it and is aware that Mary Acosta has cirrhosis.  If Mary Acosta thinks she has not had a sufficient discussion or understanding of the risk and benefits of that medicine with her rheumatologist, then she should certainly communicate with them about that.  ? Please fax my most recent office progress note to Dr. Kathlene November to be certain she is up-to-date on this patient's liver condition. ? ?HD ?

## 2021-12-27 DIAGNOSIS — E119 Type 2 diabetes mellitus without complications: Secondary | ICD-10-CM | POA: Diagnosis not present

## 2021-12-31 DIAGNOSIS — M533 Sacrococcygeal disorders, not elsewhere classified: Secondary | ICD-10-CM | POA: Diagnosis not present

## 2021-12-31 DIAGNOSIS — M5137 Other intervertebral disc degeneration, lumbosacral region: Secondary | ICD-10-CM | POA: Diagnosis not present

## 2021-12-31 DIAGNOSIS — G894 Chronic pain syndrome: Secondary | ICD-10-CM | POA: Diagnosis not present

## 2022-01-04 DIAGNOSIS — F419 Anxiety disorder, unspecified: Secondary | ICD-10-CM | POA: Diagnosis not present

## 2022-01-04 DIAGNOSIS — F332 Major depressive disorder, recurrent severe without psychotic features: Secondary | ICD-10-CM | POA: Diagnosis not present

## 2022-01-04 DIAGNOSIS — R69 Illness, unspecified: Secondary | ICD-10-CM | POA: Diagnosis not present

## 2022-01-08 DIAGNOSIS — R700 Elevated erythrocyte sedimentation rate: Secondary | ICD-10-CM | POA: Diagnosis not present

## 2022-01-08 DIAGNOSIS — M8580 Other specified disorders of bone density and structure, unspecified site: Secondary | ICD-10-CM | POA: Diagnosis not present

## 2022-01-08 DIAGNOSIS — R768 Other specified abnormal immunological findings in serum: Secondary | ICD-10-CM | POA: Diagnosis not present

## 2022-01-08 DIAGNOSIS — L853 Xerosis cutis: Secondary | ICD-10-CM | POA: Diagnosis not present

## 2022-01-08 DIAGNOSIS — E1165 Type 2 diabetes mellitus with hyperglycemia: Secondary | ICD-10-CM | POA: Diagnosis not present

## 2022-01-08 DIAGNOSIS — M0609 Rheumatoid arthritis without rheumatoid factor, multiple sites: Secondary | ICD-10-CM | POA: Diagnosis not present

## 2022-01-08 DIAGNOSIS — M1990 Unspecified osteoarthritis, unspecified site: Secondary | ICD-10-CM | POA: Diagnosis not present

## 2022-01-08 DIAGNOSIS — M791 Myalgia, unspecified site: Secondary | ICD-10-CM | POA: Diagnosis not present

## 2022-01-08 DIAGNOSIS — E559 Vitamin D deficiency, unspecified: Secondary | ICD-10-CM | POA: Diagnosis not present

## 2022-01-09 DIAGNOSIS — J449 Chronic obstructive pulmonary disease, unspecified: Secondary | ICD-10-CM | POA: Diagnosis not present

## 2022-01-10 ENCOUNTER — Other Ambulatory Visit: Payer: Self-pay | Admitting: Internal Medicine

## 2022-01-10 ENCOUNTER — Other Ambulatory Visit: Payer: Self-pay | Admitting: Cardiology

## 2022-01-13 ENCOUNTER — Encounter: Payer: Self-pay | Admitting: Internal Medicine

## 2022-01-13 NOTE — Progress Notes (Signed)
Subjective:    Patient ID: Mary Acosta, female    DOB: May 07, 1956, 66 y.o.   MRN: [id number]      HPI Mary Acosta is here for  Chief Complaint  Patient presents with   Annual Exam     She is currently trying to wean off of some of her pain medication.  She has been in significant pain and is currently on steroids for relatively newly diagnosed seronegative rheumatoid arthritis.  She was placed on methotrexate, but this elevated her liver enzymes and she had to stop it.  She will hopefully be going on Humira, but this needs approval.  Unfortunately with the prednisone she has gained a significant amount of weight and her sugars are very elevated.  She sees Dr. Cruzita Lederer in 2 days  Medications and allergies reviewed with patient and updated if appropriate.  Current Outpatient Medications on File Prior to Visit  Medication Sig Dispense Refill   acetaminophen (TYLENOL) 500 MG tablet Take 1,000 mg by mouth every 6 (six) hours as needed for moderate pain.     Adalimumab (HUMIRA PEN) 40 MG/0.4ML PNKT See admin instructions.     albuterol (VENTOLIN HFA) 108 (90 Base) MCG/ACT inhaler TAKE 2 PUFFS BY MOUTH EVERY 6 HOURS AS NEEDED FOR WHEEZE OR SHORTNESS OF BREATH 18 g 11   amLODipine (NORVASC) 5 MG tablet TAKE ONE TABLET BY MOUTH EVERY MORNING and TAKE ONE TABLET BY MOUTH EVERYDAY AT BEDTIME 60 tablet 0   aspirin 81 MG chewable tablet Chew by mouth daily.     ergocalciferol (VITAMIN D2) 1.25 MG (50000 UT) capsule 1 capsule     fexofenadine (ALLEGRA) 180 MG tablet Take 180 mg by mouth daily as needed for allergies or rhinitis.     furosemide (LASIX) 40 MG tablet TAKE ONE TABLET BY MOUTH every morning 30 tablet 5   gabapentin (NEURONTIN) 300 MG capsule Take 300 mg by mouth 3 (three) times daily as needed (pain).     hydrALAZINE (APRESOLINE) 25 MG tablet TAKE ONE TABLET BY MOUTH EVERY MORNING and TAKE ONE TABLET BY MOUTH EVERYDAY AT BEDTIME 90 tablet 5   hydrOXYzine (VISTARIL) 50 MG capsule  Take 50 mg by mouth 2 (two) times daily as needed for itching or anxiety.     Insulin Glargine (BASAGLAR KWIKPEN) 100 UNIT/ML Inject 80 Units into the skin at bedtime. (Patient taking differently: Inject 160 Units into the skin at bedtime. Divided into 2 separate injections)     Insulin Pen Needle (PEN NEEDLES) 31G X 8 MM MISC 1 Device by Does not apply route daily. 90 each 3   Insulin Syringe-Needle U-100 26G X 1/2" 1 ML MISC Use daily for insulin injection as directed 100 each 3   ipratropium-albuterol (DUONEB) 0.5-2.5 (3) MG/3ML SOLN Take 3 mLs by nebulization every 6 (six) hours as needed (Wheezing or dyspnea.). 360 mL 11   levothyroxine (SYNTHROID) 150 MCG tablet Take 1 tablet (150 mcg total) by mouth daily. 90 tablet 3   metFORMIN (GLUCOPHAGE) 500 MG tablet Take 1 tablet (500 mg total) by mouth 2 (two) times daily with a meal. 180 tablet 3   metoprolol tartrate (LOPRESSOR) 25 MG tablet TAKE ONE TABLET BY MOUTH EVERY MORNING and TAKE ONE TABLET BY MOUTH EVERYDAY AT BEDTIME 180 tablet 3   morphine (MS CONTIN) 30 MG 12 hr tablet Take 30 mg by mouth every 8 (eight) hours.     naloxone (NARCAN) nasal spray 4 mg/0.1 mL One spray in either nostril  once for known/suspected opioid overdose. May repeat every 2-3 minutes in alternating nostril til EMS arrives     OXYGEN Inhale 3 L into the lungs at bedtime.     PARoxetine (PAXIL) 40 MG tablet Take 1 tablet (40 mg total) by mouth daily. 90 tablet 1   potassium chloride SA (KLOR-CON M20) 20 MEQ tablet TAKE 2 TABLETS EVERY DAY AS NEEDED FOR CRAMPING 180 tablet 1   promethazine (PHENERGAN) 25 MG tablet Take 0.5 tablets (12.5 mg total) by mouth every 8 (eight) hours as needed for nausea or vomiting. 30 tablet 1   Propylene Glycol (SYSTANE BALANCE) 0.6 % SOLN Place 1 drop into both eyes daily as needed (dry eyes).     Semaglutide, 1 MG/DOSE, (OZEMPIC, 1 MG/DOSE,) 4 MG/3ML SOPN Inject 1 mg into the skin once a week. (Patient taking differently: Inject 1 mg into  the skin every Monday.) 9 mL 3   simvastatin (ZOCOR) 20 MG tablet Take 1 tablet (20 mg total) by mouth daily at 6 PM. PATIENT MUST SCHEDULE APPOINTMENT FOR FUTURE REFILLS. SECOND ATTEMPT 15 tablet 0   simvastatin (ZOCOR) 20 MG tablet 1 tablet in the evening     tiZANidine (ZANAFLEX) 4 MG tablet Take 4 mg by mouth every 8 (eight) hours as needed for muscle spasms.     traZODone (DESYREL) 150 MG tablet Take 150 mg by mouth at bedtime as needed for sleep.     busPIRone (BUSPAR) 15 MG tablet Take 15 mg by mouth 2 (two) times daily.     cloNIDine (CATAPRES) 0.1 MG tablet Take 0.1 mg by mouth every 8 (eight) hours as needed.     fexofenadine (ALLEGRA) 180 MG tablet 1 tablet Swallow whole with water; do not take with fruit juices.     folic acid (FOLVITE) 1 MG tablet Take 1 mg by mouth daily.     morphine (MS CONTIN) 15 MG 12 hr tablet Take 30 mg by mouth 2 (two) times daily.     naloxone (NARCAN) nasal spray 4 mg/0.1 mL See admin instructions.     predniSONE (DELTASONE) 5 MG tablet Take 30 mg by mouth daily.     promethazine (PHENERGAN) 25 MG tablet 1 tablet as needed     No current facility-administered medications on file prior to visit.    Review of Systems  Constitutional:  Negative for fever.  Eyes:  Negative for visual disturbance.  Respiratory:  Negative for cough, shortness of breath and wheezing.   Cardiovascular:  Negative for chest pain, palpitations and leg swelling.  Gastrointestinal:  Negative for abdominal pain, blood in stool, constipation, diarrhea and nausea.  Genitourinary:  Negative for dysuria.  Musculoskeletal:  Negative for arthralgias and back pain.  Skin:  Negative for rash.  Neurological:  Negative for light-headedness and headaches.  Psychiatric/Behavioral:  Negative for dysphoric mood. The patient is not nervous/anxious.       Objective:   Vitals:   01/14/22 1405  BP: 134/72  Pulse: 74  Temp: 98.5 F (36.9 C)  SpO2: 97%   Filed Weights   01/14/22 1405   Weight: 292 lb (132.5 kg)   Body mass index is 43.12 kg/m.  BP Readings from Last 3 Encounters:  01/14/22 134/72  11/19/21 (!) 175/63  10/09/21 128/78    Wt Readings from Last 3 Encounters:  01/14/22 292 lb (132.5 kg)  11/19/21 271 lb (122.9 kg)  11/05/21 270 lb (122.5 kg)       05/28/2021    1:25 PM 01/28/2020  3:46 PM 09/04/2018    9:24 AM 06/13/2017   11:22 AM 06/04/2016    4:20 PM  Depression screen PHQ 2/9  Decreased Interest 3 1 0 0 0  Down, Depressed, Hopeless 3 0 0 0 0  PHQ - 2 Score 6 1 0 0 0  Altered sleeping 3      Tired, decreased energy 3      Change in appetite 1      Feeling bad or failure about yourself  0      Trouble concentrating 3      Moving slowly or fidgety/restless 3      Suicidal thoughts 0      PHQ-9 Score 19      Difficult doing work/chores Somewhat difficult             View : No data to display.              Physical Exam Constitutional: She appears well-developed and well-nourished. Drowsy - difficulty staying awake.  No distress.  HENT:  Head: Normocephalic and atraumatic.  Right Ear: External ear normal. Normal ear canal and TM Left Ear: External ear normal.  Normal ear canal and TM Mouth/Throat: Oropharynx is clear and moist.  Eyes: Conjunctivae normal.  Neck: Neck supple. No tracheal deviation present. No thyromegaly present.  No carotid bruit  Cardiovascular: Normal rate, regular rhythm and normal heart sounds.   No murmur heard.  Mild b/l LE edema. Pulmonary/Chest: Effort normal and breath sounds normal. No respiratory distress. She has no wheezes. She has no rales.  Breast: deferred   Abdominal: Soft. She exhibits no distension. There is no tenderness.  Lymphadenopathy: She has no cervical adenopathy.  Skin: Skin is warm and dry. She is not diaphoretic.  Psychiatric: She has a normal mood and affect. Her behavior is normal.     Lab Results  Component Value Date   WBC 9.9 11/23/2021   HGB 12.6 11/23/2021    HCT 40.2 11/23/2021   PLT 214.0 11/23/2021   GLUCOSE 185 (H) 11/23/2021   CHOL 183 10/09/2021   TRIG 127.0 10/09/2021   HDL 52.90 10/09/2021   LDLCALC 104 (H) 10/09/2021   ALT 15 11/23/2021   AST 20 11/23/2021   NA 138 11/23/2021   K 3.9 11/23/2021   CL 97 11/23/2021   CREATININE 0.87 11/23/2021   BUN 15 11/23/2021   CO2 37 (H) 11/23/2021   TSH 0.13 (L) 10/09/2021   INR 1.1 05/30/2021   HGBA1C 8.1 (A) 10/09/2021   MICROALBUR <0.7 09/19/2017         Assessment & Plan:   Physical exam: Screening blood work  deferred-she has had blood work done in the past couple of months so we will hold off on additional blood work today Exercise  none Weight  discussed weight loss Substance abuse  none   Reviewed recommended immunizations.   Health Maintenance  Topic Date Due   Zoster Vaccines- Shingrix (1 of 2) Never done   COLONOSCOPY (Pts 45-39yr Insurance coverage will need to be confirmed)  11/25/2017   PAP SMEAR-Modifier  05/27/2019   COVID-19 Vaccine (3 - Moderna risk series) 01/30/2022 (Originally 11/30/2019)   DEXA SCAN  01/15/2023 (Originally 06/15/2021)   TETANUS/TDAP  01/15/2023 (Originally 06/16/1975)   INFLUENZA VACCINE  03/26/2022   HEMOGLOBIN A1C  04/08/2022   OPHTHALMOLOGY EXAM  09/13/2022   FOOT EXAM  10/09/2022   MAMMOGRAM  03/25/2023   Pneumonia Vaccine 66 Years old  Completed   Hepatitis  C Screening  Completed   HIV Screening  Completed   HPV VACCINES  Aged Out          See Problem List for Assessment and Plan of chronic medical problems.

## 2022-01-13 NOTE — Patient Instructions (Addendum)
Medications changes include :   none     Return in about 1 year (around 01/15/2023) for Physical Exam.   Health Maintenance, Female Adopting a healthy lifestyle and getting preventive care are important in promoting health and wellness. Ask your health care provider about: The right schedule for you to have regular tests and exams. Things you can do on your own to prevent diseases and keep yourself healthy. What should I know about diet, weight, and exercise? Eat a healthy diet  Eat a diet that includes plenty of vegetables, fruits, low-fat dairy products, and lean protein. Do not eat a lot of foods that are high in solid fats, added sugars, or sodium. Maintain a healthy weight Body mass index (BMI) is used to identify weight problems. It estimates body fat based on height and weight. Your health care provider can help determine your BMI and help you achieve or maintain a healthy weight. Get regular exercise Get regular exercise. This is one of the most important things you can do for your health. Most adults should: Exercise for at least 150 minutes each week. The exercise should increase your heart rate and make you sweat (moderate-intensity exercise). Do strengthening exercises at least twice a week. This is in addition to the moderate-intensity exercise. Spend less time sitting. Even light physical activity can be beneficial. Watch cholesterol and blood lipids Have your blood tested for lipids and cholesterol at 66 years of age, then have this test every 5 years. Have your cholesterol levels checked more often if: Your lipid or cholesterol levels are high. You are older than 65 years of age. You are at high risk for heart disease. What should I know about cancer screening? Depending on your health history and family history, you may need to have cancer screening at various ages. This may include screening for: Breast cancer. Cervical cancer. Colorectal cancer. Skin  cancer. Lung cancer. What should I know about heart disease, diabetes, and high blood pressure? Blood pressure and heart disease High blood pressure causes heart disease and increases the risk of stroke. This is more likely to develop in people who have high blood pressure readings or are overweight. Have your blood pressure checked: Every 3-5 years if you are 11-57 years of age. Every year if you are 46 years old or older. Diabetes Have regular diabetes screenings. This checks your fasting blood sugar level. Have the screening done: Once every three years after age 66 if you are at a normal weight and have a low risk for diabetes. More often and at a younger age if you are overweight or have a high risk for diabetes. What should I know about preventing infection? Hepatitis B If you have a higher risk for hepatitis B, you should be screened for this virus. Talk with your health care provider to find out if you are at risk for hepatitis B infection. Hepatitis C Testing is recommended for: Everyone born from [birth year] through 1965. Anyone with known risk factors for hepatitis C. Sexually transmitted infections (STIs) Get screened for STIs, including gonorrhea and chlamydia, if: You are sexually active and are younger than 66 years of age. You are older than 66 years of age and your health care provider tells you that you are at risk for this type of infection. Your sexual activity has changed since you were last screened, and you are at increased risk for chlamydia or gonorrhea. Ask your health care provider if you are at  risk. Ask your health care provider about whether you are at high risk for HIV. Your health care provider may recommend a prescription medicine to help prevent HIV infection. If you choose to take medicine to prevent HIV, you should first get tested for HIV. You should then be tested every 3 months for as long as you are taking the medicine. Pregnancy If you are about to stop  having your period (premenopausal) and you may become pregnant, seek counseling before you get pregnant. Take 400 to 800 micrograms (mcg) of folic acid every day if you become pregnant. Ask for birth control (contraception) if you want to prevent pregnancy. Osteoporosis and menopause Osteoporosis is a disease in which the bones lose minerals and strength with aging. This can result in bone fractures. If you are 97 years old or older, or if you are at risk for osteoporosis and fractures, ask your health care provider if you should: Be screened for bone loss. Take a calcium or vitamin D supplement to lower your risk of fractures. Be given hormone replacement therapy (HRT) to treat symptoms of menopause. Follow these instructions at home: Alcohol use Do not drink alcohol if: Your health care provider tells you not to drink. You are pregnant, may be pregnant, or are planning to become pregnant. If you drink alcohol: Limit how much you have to: 0-1 drink a day. Know how much alcohol is in your drink. In the [place] one drink equals one 12 oz bottle of beer (355 mL), one 5 oz glass of wine (148 mL), or one 1 oz glass of hard liquor (44 mL). Lifestyle Do not use any products that contain nicotine or tobacco. These products include cigarettes, chewing tobacco, and vaping devices, such as e-cigarettes. If you need help quitting, ask your health care provider. Do not use street drugs. Do not share needles. Ask your health care provider for help if you need support or information about quitting drugs. General instructions Schedule regular health, dental, and eye exams. Stay current with your vaccines. Tell your health care provider if: You often feel depressed. You have ever been abused or do not feel safe at home. Summary Adopting a healthy lifestyle and getting preventive care are important in promoting health and wellness. Follow your health care provider's instructions about healthy diet,  exercising, and getting tested or screened for diseases. Follow your health care provider's instructions on monitoring your cholesterol and blood pressure. This information is not intended to replace advice given to you by your health care provider. Make sure you discuss any questions you have with your health care provider. Document Revised: 01/01/2021 Document Reviewed: 01/01/2021 Elsevier Patient Education  [place].

## 2022-01-14 ENCOUNTER — Ambulatory Visit (INDEPENDENT_AMBULATORY_CARE_PROVIDER_SITE_OTHER): Payer: Medicare HMO | Admitting: Internal Medicine

## 2022-01-14 VITALS — BP 134/72 | HR 74 | Temp 98.5°F | Ht 69.0 in | Wt 292.0 lb

## 2022-01-14 DIAGNOSIS — G4733 Obstructive sleep apnea (adult) (pediatric): Secondary | ICD-10-CM | POA: Diagnosis not present

## 2022-01-14 DIAGNOSIS — E559 Vitamin D deficiency, unspecified: Secondary | ICD-10-CM | POA: Diagnosis not present

## 2022-01-14 DIAGNOSIS — E782 Mixed hyperlipidemia: Secondary | ICD-10-CM | POA: Diagnosis not present

## 2022-01-14 DIAGNOSIS — Z0000 Encounter for general adult medical examination without abnormal findings: Secondary | ICD-10-CM

## 2022-01-14 DIAGNOSIS — E538 Deficiency of other specified B group vitamins: Secondary | ICD-10-CM | POA: Diagnosis not present

## 2022-01-14 DIAGNOSIS — E890 Postprocedural hypothyroidism: Secondary | ICD-10-CM | POA: Diagnosis not present

## 2022-01-14 DIAGNOSIS — F3289 Other specified depressive episodes: Secondary | ICD-10-CM

## 2022-01-14 DIAGNOSIS — M0600 Rheumatoid arthritis without rheumatoid factor, unspecified site: Secondary | ICD-10-CM | POA: Insufficient documentation

## 2022-01-14 DIAGNOSIS — E1159 Type 2 diabetes mellitus with other circulatory complications: Secondary | ICD-10-CM

## 2022-01-14 DIAGNOSIS — R69 Illness, unspecified: Secondary | ICD-10-CM | POA: Diagnosis not present

## 2022-01-14 DIAGNOSIS — I10 Essential (primary) hypertension: Secondary | ICD-10-CM | POA: Diagnosis not present

## 2022-01-14 DIAGNOSIS — I5032 Chronic diastolic (congestive) heart failure: Secondary | ICD-10-CM

## 2022-01-14 DIAGNOSIS — E1165 Type 2 diabetes mellitus with hyperglycemia: Secondary | ICD-10-CM

## 2022-01-14 DIAGNOSIS — F411 Generalized anxiety disorder: Secondary | ICD-10-CM

## 2022-01-14 NOTE — Assessment & Plan Note (Signed)
Chronic Continue B12 supplementation 

## 2022-01-14 NOTE — Assessment & Plan Note (Addendum)
Chronic °Euvolemic °Following with cardiology °

## 2022-01-14 NOTE — Assessment & Plan Note (Signed)
Neck Following with Dr. Cruzita Lederer Continue levothyroxine 150 mcg daily

## 2022-01-14 NOTE — Assessment & Plan Note (Signed)
Chronic Continue simvastatin 20 mg daily 

## 2022-01-14 NOTE — Assessment & Plan Note (Addendum)
Chronic Blood pressure well controlled Continue amlodipine 5 mg daily, hydralazine 25 mg twice daily, metoprolol 25 mg twice daily

## 2022-01-14 NOTE — Assessment & Plan Note (Signed)
Chronic Management per psychiatry Overall controlled 

## 2022-01-14 NOTE — Assessment & Plan Note (Signed)
Did not take methotrexate - elevated lfts On prednisone 30 mg daily - > causing high sugars

## 2022-01-14 NOTE — Assessment & Plan Note (Signed)
Chronic Continue daily vitamin D supplementation

## 2022-01-14 NOTE — Assessment & Plan Note (Addendum)
Chronic Management per endocrine Lab Results  Component Value Date   HGBA1C 8.1 (A) 10/09/2021   Sugars very high at home due to being on prednisone - currently on 30 mg daily Sees Dr Cruzita Lederer in 2 days

## 2022-01-14 NOTE — Assessment & Plan Note (Signed)
Chronic Failed CPAP Not eligible for inspire-BMI too high This is likely contributing to her fatigue and drowsiness

## 2022-01-15 ENCOUNTER — Ambulatory Visit: Payer: Medicare HMO | Admitting: Internal Medicine

## 2022-01-25 DIAGNOSIS — E119 Type 2 diabetes mellitus without complications: Secondary | ICD-10-CM | POA: Diagnosis not present

## 2022-01-29 DIAGNOSIS — M533 Sacrococcygeal disorders, not elsewhere classified: Secondary | ICD-10-CM | POA: Diagnosis not present

## 2022-01-29 DIAGNOSIS — Z79891 Long term (current) use of opiate analgesic: Secondary | ICD-10-CM | POA: Diagnosis not present

## 2022-01-29 DIAGNOSIS — M5137 Other intervertebral disc degeneration, lumbosacral region: Secondary | ICD-10-CM | POA: Diagnosis not present

## 2022-01-29 DIAGNOSIS — G894 Chronic pain syndrome: Secondary | ICD-10-CM | POA: Diagnosis not present

## 2022-02-04 ENCOUNTER — Encounter: Payer: Self-pay | Admitting: Internal Medicine

## 2022-02-05 ENCOUNTER — Telehealth: Payer: Self-pay | Admitting: *Deleted

## 2022-02-05 ENCOUNTER — Emergency Department (HOSPITAL_COMMUNITY)
Admission: EM | Admit: 2022-02-05 | Discharge: 2022-02-06 | Disposition: A | Discharge status: Home / Self Care | Payer: Medicare HMO | Attending: Emergency Medicine | Admitting: Emergency Medicine

## 2022-02-05 ENCOUNTER — Other Ambulatory Visit: Payer: Self-pay

## 2022-02-05 ENCOUNTER — Emergency Department (HOSPITAL_COMMUNITY): Payer: Medicare HMO

## 2022-02-05 ENCOUNTER — Encounter (HOSPITAL_COMMUNITY): Payer: Self-pay

## 2022-02-05 DIAGNOSIS — J449 Chronic obstructive pulmonary disease, unspecified: Secondary | ICD-10-CM | POA: Diagnosis not present

## 2022-02-05 DIAGNOSIS — Z794 Long term (current) use of insulin: Secondary | ICD-10-CM | POA: Insufficient documentation

## 2022-02-05 DIAGNOSIS — Z7982 Long term (current) use of aspirin: Secondary | ICD-10-CM | POA: Insufficient documentation

## 2022-02-05 DIAGNOSIS — D72829 Elevated white blood cell count, unspecified: Secondary | ICD-10-CM | POA: Insufficient documentation

## 2022-02-05 DIAGNOSIS — R109 Unspecified abdominal pain: Secondary | ICD-10-CM | POA: Diagnosis not present

## 2022-02-05 DIAGNOSIS — K7460 Unspecified cirrhosis of liver: Secondary | ICD-10-CM | POA: Diagnosis not present

## 2022-02-05 DIAGNOSIS — K8689 Other specified diseases of pancreas: Secondary | ICD-10-CM | POA: Diagnosis not present

## 2022-02-05 DIAGNOSIS — M0609 Rheumatoid arthritis without rheumatoid factor, multiple sites: Secondary | ICD-10-CM | POA: Diagnosis not present

## 2022-02-05 DIAGNOSIS — E1165 Type 2 diabetes mellitus with hyperglycemia: Secondary | ICD-10-CM | POA: Diagnosis not present

## 2022-02-05 DIAGNOSIS — N390 Urinary tract infection, site not specified: Secondary | ICD-10-CM | POA: Diagnosis not present

## 2022-02-05 DIAGNOSIS — Z7984 Long term (current) use of oral hypoglycemic drugs: Secondary | ICD-10-CM | POA: Insufficient documentation

## 2022-02-05 DIAGNOSIS — Z1159 Encounter for screening for other viral diseases: Secondary | ICD-10-CM | POA: Diagnosis not present

## 2022-02-05 DIAGNOSIS — I509 Heart failure, unspecified: Secondary | ICD-10-CM | POA: Diagnosis not present

## 2022-02-05 DIAGNOSIS — E7800 Pure hypercholesterolemia, unspecified: Secondary | ICD-10-CM | POA: Diagnosis not present

## 2022-02-05 DIAGNOSIS — B9689 Other specified bacterial agents as the cause of diseases classified elsewhere: Secondary | ICD-10-CM | POA: Diagnosis not present

## 2022-02-05 DIAGNOSIS — M160 Bilateral primary osteoarthritis of hip: Secondary | ICD-10-CM | POA: Diagnosis not present

## 2022-02-05 DIAGNOSIS — N179 Acute kidney failure, unspecified: Secondary | ICD-10-CM | POA: Diagnosis not present

## 2022-02-05 DIAGNOSIS — Z85850 Personal history of malignant neoplasm of thyroid: Secondary | ICD-10-CM | POA: Insufficient documentation

## 2022-02-05 DIAGNOSIS — K769 Liver disease, unspecified: Secondary | ICD-10-CM | POA: Diagnosis not present

## 2022-02-05 LAB — CBC
HCT: 44.9 % (ref 36.0–46.0)
Hemoglobin: 14.1 g/dL (ref 12.0–15.0)
MCH: 25.6 pg — ABNORMAL LOW (ref 26.0–34.0)
MCHC: 31.4 g/dL (ref 30.0–36.0)
MCV: 81.6 fL (ref 80.0–100.0)
Platelets: 222 10*3/uL (ref 150–400)
RBC: 5.5 MIL/uL — ABNORMAL HIGH (ref 3.87–5.11)
RDW: 18.2 % — ABNORMAL HIGH (ref 11.5–15.5)
WBC: 12.4 10*3/uL — ABNORMAL HIGH (ref 4.0–10.5)
nRBC: 0 % (ref 0.0–0.2)

## 2022-02-05 LAB — HEPATIC FUNCTION PANEL
ALT: 13 U/L (ref 0–44)
AST: 11 U/L — ABNORMAL LOW (ref 15–41)
Albumin: 2.7 g/dL — ABNORMAL LOW (ref 3.5–5.0)
Alkaline Phosphatase: 43 U/L (ref 38–126)
Bilirubin, Direct: 0.1 mg/dL (ref 0.0–0.2)
Indirect Bilirubin: 0.6 mg/dL (ref 0.3–0.9)
Total Bilirubin: 0.7 mg/dL (ref 0.3–1.2)
Total Protein: 4.9 g/dL — ABNORMAL LOW (ref 6.5–8.1)

## 2022-02-05 LAB — URINALYSIS, ROUTINE W REFLEX MICROSCOPIC

## 2022-02-05 LAB — BASIC METABOLIC PANEL
Anion gap: 12 (ref 5–15)
BUN: 26 mg/dL — ABNORMAL HIGH (ref 8–23)
CO2: 26 mmol/L (ref 22–32)
Calcium: 9.9 mg/dL (ref 8.9–10.3)
Chloride: 101 mmol/L (ref 98–111)
Creatinine, Ser: 1.12 mg/dL — ABNORMAL HIGH (ref 0.44–1.00)
GFR, Estimated: 55 mL/min — ABNORMAL LOW (ref >60)
Glucose, Bld: 286 mg/dL — ABNORMAL HIGH (ref 70–99)
Potassium: 4 mmol/L (ref 3.5–5.1)
Sodium: 139 mmol/L (ref 135–145)

## 2022-02-05 LAB — CBG MONITORING, ED
Glucose-Capillary: 309 mg/dL — ABNORMAL HIGH (ref 70–99)
Glucose-Capillary: 184 mg/dL — ABNORMAL HIGH (ref 70–99)

## 2022-02-05 MED ORDER — SODIUM CHLORIDE 0.9 % IV BOLUS
1000.0000 mL | Freq: Once | INTRAVENOUS | Status: AC
  Administered 2022-02-05: 1000 mL via INTRAVENOUS

## 2022-02-05 NOTE — ED Provider Triage Note (Signed)
Emergency Medicine Provider Triage Evaluation Note  Mary Acosta , a 66 y.o. female  was evaluated in triage.  Pt complains of polyuria, has been urinating more frequently, thought she was likely having a UTI.  Did take some Azo's.  She has been on chronic oral steroids since the past few weeks by her rheumatologist.  She does have a history of diabetes, now takes 20 mg of steroids daily, this dose has been decreased from her original 30 mg daily.  Patient has had diabetes since the early 2000's.  She also endorses pain along the left flank, "like my kidneys are hurting ".  He is followed closely by rheumatologist who did lab work today and told her that her blood sugar was 600.Marland Kitchen  Review of Systems  Positive: Polyuria, left flank pain Negative: Fever, sob  Physical Exam  BP (!) 206/119 (BP Location: Right Arm)   Pulse (!) 112   Temp 99 F (37.2 C) (Oral)   Resp 18   Ht '5\' 9"'$  (1.753 m)   Wt 132 kg   SpO2 92%   BMI 42.97 kg/m  Gen:   Awake, no distress   Resp:  Normal effort  MSK:   Moves extremities without difficulty  Other:    Medical Decision Making  Medically screening exam initiated at 6:39 PM.  Appropriate orders placed.  Mary Acosta was informed that the remainder of the evaluation will be completed by another provider, this initial triage assessment does not replace that evaluation, and the importance of remaining in the ED until their evaluation is complete.     Janeece Fitting, PA-C 02/05/22 1844

## 2022-02-05 NOTE — ED Provider Notes (Signed)
[facility] DEPT Provider Note   CSN: [id number] Arrival date & time: 02/05/22  1724     History  Chief Complaint  Patient presents with   Hyperglycemia   Back Pain    Mary Acosta is a 66 y.o. female with history of cirrhosis, type 2 diabetes that is insulin-dependent, recurrent C. difficile, and RA recently started on Humira and prednisone taper who presents with concern for report of critical hyperglycemia greater than 600 at outpatient labs today, directed her to the emergency department.  She does states she has been urinating more often than normal and has symptoms of a urinary tract infection with discomfort when she urinates and right-sided flank pain.  Has been taking Azo at home with no relief.  Denies polydipsia.  Also endorses that her blood pressure has been high but primarily on days when she forgets to take her prednisone.  He is following closely with her gastroenterologist and her rheumatologist.  In addition to the above listed patient has history of bariatric surgery, CHF, paroxysmal SVT, COPD, and thyroid cancer status post thyroidectomy in 2007.  HPI     Home Medications Prior to Admission medications   Medication Sig Start Date End Date Taking? Authorizing Provider  acetaminophen (TYLENOL) 500 MG tablet Take 1,000 mg by mouth every 6 (six) hours as needed for moderate pain.   Yes [provider]  Adalimumab (HUMIRA PEN) 40 MG/0.4ML PNKT Inject 40 mg into the skin every 14 (fourteen) days. Every other Thursday 01/08/22  Yes [provider]  albuterol (VENTOLIN HFA) 108 (90 Base) MCG/ACT inhaler TAKE 2 PUFFS BY MOUTH EVERY 6 HOURS AS NEEDED FOR WHEEZE OR SHORTNESS OF BREATH Patient taking differently: Inhale 2 puffs into the lungs every 6 (six) hours as needed for wheezing or shortness of breath. 08/21/21  Yes Burns, Claudina Lick, MD  amLODipine (NORVASC) 5 MG tablet TAKE ONE TABLET BY MOUTH EVERY MORNING and TAKE  ONE TABLET BY MOUTH EVERYDAY AT BEDTIME Patient taking differently: Take 5 mg by mouth daily. 12/19/21  Yes Burns, Claudina Lick, MD  aspirin 81 MG chewable tablet Chew 81 mg by mouth daily.   Yes [provider]  busPIRone (BUSPAR) 15 MG tablet Take 15 mg by mouth daily as needed (for anxiety). 12/25/21  Yes [provider]  ergocalciferol (VITAMIN D2) 1.25 MG (50000 UT) capsule Take 50,000 Units by mouth once a week. Monday 11/28/21  Yes [provider]  fexofenadine (ALLEGRA) 180 MG tablet Take 180 mg by mouth daily as needed for allergies or rhinitis.   Yes [provider]  folic acid (FOLVITE) 1 MG tablet Take 1 mg by mouth daily. 12/21/21  Yes [provider]  furosemide (LASIX) 40 MG tablet TAKE ONE TABLET BY MOUTH every morning Patient taking differently: Take 40 mg by mouth daily as needed for edema. 12/11/21  Yes Burns, Claudina Lick, MD  gabapentin (NEURONTIN) 300 MG capsule Take 300 mg by mouth 3 (three) times daily as needed (pain). 06/10/19  Yes [provider]  hydrALAZINE (APRESOLINE) 25 MG tablet TAKE ONE TABLET BY MOUTH EVERY MORNING and TAKE ONE TABLET BY MOUTH EVERYDAY AT BEDTIME Patient taking differently: Take 25 mg by mouth 2 (two) times daily. 01/10/22  Yes Burns, Claudina Lick, MD  hydrOXYzine (VISTARIL) 50 MG capsule Take 50 mg by mouth 2 (two) times daily as needed for itching or anxiety. 07/04/21  Yes [provider]  Insulin Glargine (BASAGLAR KWIKPEN) 100 UNIT/ML Inject 80  Units into the skin at bedtime. Patient taking differently: Inject 160 Units into the skin at bedtime. Divided into 2 separate injections 10/09/21  Yes Philemon Kingdom, MD  ipratropium-albuterol (DUONEB) 0.5-2.5 (3) MG/3ML SOLN Take 3 mLs by nebulization every 6 (six) hours as needed (Wheezing or dyspnea.). 06/05/20  Yes Brand Males, MD  levothyroxine (SYNTHROID) 150 MCG tablet Take 1 tablet (150 mcg total) by mouth daily. 10/11/21  Yes Philemon Kingdom, MD   metFORMIN (GLUCOPHAGE) 500 MG tablet Take 1 tablet (500 mg total) by mouth 2 (two) times daily with a meal. Patient taking differently: Take 500 mg by mouth daily with breakfast. 06/04/21  Yes Philemon Kingdom, MD  metoprolol tartrate (LOPRESSOR) 25 MG tablet TAKE ONE TABLET BY MOUTH EVERY MORNING and TAKE ONE TABLET BY MOUTH EVERYDAY AT BEDTIME Patient taking differently: Take 25 mg by mouth at bedtime. 01/15/21  Yes Lelon Perla, MD  morphine (MS CONTIN) 15 MG 12 hr tablet Take 15 mg by mouth daily. May take 1 additional tablet in the evening as needed for pain 01/01/22  Yes [provider]  naloxone [facility]) nasal spray 4 mg/0.1 mL One spray in either nostril once for known/suspected opioid overdose. May repeat every 2-3 minutes in alternating nostril til EMS arrives 08/28/20  Yes [provider]  PARoxetine (PAXIL) 40 MG tablet Take 1 tablet (40 mg total) by mouth daily. 02/17/20  Yes Burns, Claudina Lick, MD  predniSONE (DELTASONE) 5 MG tablet Take 20 mg by mouth daily. 12/18/21  Yes [provider]  promethazine (PHENERGAN) 25 MG tablet Take 0.5 tablets (12.5 mg total) by mouth every 8 (eight) hours as needed for nausea or vomiting. 11/19/21  Yes Danis, Kirke Corin, MD  Propylene Glycol (SYSTANE BALANCE) 0.6 % SOLN Place 1 drop into both eyes daily as needed (dry eyes).   Yes [provider]  Semaglutide, 1 MG/DOSE, (OZEMPIC, 1 MG/DOSE,) 4 MG/3ML SOPN Inject 1 mg into the skin once a week. Patient taking differently: Inject 1 mg into the skin every Monday. 06/04/21  Yes Philemon Kingdom, MD  simvastatin (ZOCOR) 20 MG tablet Take 1 tablet (20 mg total) by mouth daily at 6 PM. PATIENT MUST SCHEDULE APPOINTMENT FOR FUTURE REFILLS. SECOND ATTEMPT Patient taking differently: Take 20 mg by mouth daily as needed (for muscle spasms). PATIENT MUST SCHEDULE APPOINTMENT FOR FUTURE REFILLS. SECOND ATTEMPT 12/19/21  Yes Lelon Perla, MD  tiZANidine (ZANAFLEX) 4 MG tablet  Take 4 mg by mouth every 8 (eight) hours as needed for muscle spasms. 05/24/21  Yes [provider]  cefUROXime (CEFTIN) 500 MG tablet Take 1 tablet (500 mg total) by mouth 2 (two) times daily with a meal for 7 days. 02/05/22 02/12/22 Yes Hilary Pundt R, PA-C  Insulin Pen Needle (PEN NEEDLES) 31G X 8 MM MISC 1 Device by Does not apply route daily. 12/22/20   Binnie Rail, MD  Insulin Syringe-Needle U-100 26G X 1/2" 1 ML MISC Use daily for insulin injection as directed 10/22/19   Binnie Rail, MD  OXYGEN Inhale 3 L into the lungs at bedtime.    [provider]  potassium chloride SA (KLOR-CON M20) 20 MEQ tablet TAKE 2 TABLETS EVERY DAY AS NEEDED FOR CRAMPING Patient taking differently: Take 40 mEq by mouth daily as needed (for cramping). 02/16/20   Binnie Rail, MD  traZODone (DESYREL) 150 MG tablet Take 150 mg by mouth at bedtime as needed for sleep. Patient not taking: Reported on 02/05/2022  [provider]      Allergies    Gabapentin, Losartan, Aricept [donepezil hcl], Hydroxyzine, Oxycodone, Sulfa antibiotics, and Sulfonamide derivatives    Review of Systems   Review of Systems  Constitutional: Negative.   HENT: Negative.    Respiratory: Negative.    Cardiovascular: Negative.   Gastrointestinal: Negative.   Genitourinary:  Positive for dysuria, flank pain and frequency.  Hematological: Negative.     Physical Exam Updated Vital Signs BP (!) 123/58   Pulse 76   Temp 99 F (37.2 C) (Oral)   Resp 16   Ht '5\' 9"'$  (1.753 m)   Wt 132 kg   SpO2 94%   BMI 42.97 kg/m  Physical Exam Vitals and nursing note reviewed.  Constitutional:      Appearance: She is obese. She is not ill-appearing or toxic-appearing.  HENT:     Head: Normocephalic and atraumatic.     Nose: Nose normal.     Mouth/Throat:     Mouth: Mucous membranes are moist.     Pharynx: No oropharyngeal exudate or posterior oropharyngeal erythema.  Eyes:     General:        Right  eye: No discharge.        Left eye: No discharge.     Extraocular Movements: Extraocular movements intact.     Conjunctiva/sclera: Conjunctivae normal.     Pupils: Pupils are equal, round, and reactive to light.  Cardiovascular:     Rate and Rhythm: Normal rate and regular rhythm.     Pulses: Normal pulses.     Heart sounds: Normal heart sounds. No murmur heard. Pulmonary:     Effort: Pulmonary effort is normal. No respiratory distress.     Breath sounds: Normal breath sounds. No wheezing or rales.  Abdominal:     General: Bowel sounds are normal. There is no distension.     Palpations: Abdomen is soft.     Tenderness: There is no abdominal tenderness. There is no right CVA tenderness, left CVA tenderness, guarding or rebound.  Musculoskeletal:        General: No deformity.     Cervical back: Neck supple.     Right lower leg: No edema.     Left lower leg: No edema.  Skin:    General: Skin is warm and dry.     Capillary Refill: Capillary refill takes less than 2 seconds.  Neurological:     General: No focal deficit present.     Mental Status: She is alert and oriented to person, place, and time. Mental status is at baseline.  Psychiatric:        Mood and Affect: Mood normal.     ED Results / Procedures / Treatments   Labs (all labs ordered are listed, but only abnormal results are displayed) Labs Reviewed  BASIC METABOLIC PANEL - Abnormal; Notable for the following components:      Result Value   Glucose, Bld 286 (*)    BUN 26 (*)    Creatinine, Ser 1.12 (*)    GFR, Estimated 55 (*)    All other components within normal limits  CBC - Abnormal; Notable for the following components:   WBC 12.4 (*)    RBC 5.50 (*)    MCH 25.6 (*)    RDW 18.2 (*)    All other components within normal limits  URINALYSIS, ROUTINE W REFLEX MICROSCOPIC - Abnormal; Notable for the following components:   Color, Urine RED (*)  APPearance HAZY (*)    Glucose, UA   (*)    Value: TEST NOT  REPORTED DUE TO COLOR INTERFERENCE OF URINE PIGMENT   Hgb urine dipstick   (*)    Value: TEST NOT REPORTED DUE TO COLOR INTERFERENCE OF URINE PIGMENT   Bilirubin Urine   (*)    Value: TEST NOT REPORTED DUE TO COLOR INTERFERENCE OF URINE PIGMENT   Ketones, ur   (*)    Value: TEST NOT REPORTED DUE TO COLOR INTERFERENCE OF URINE PIGMENT   Protein, ur   (*)    Value: TEST NOT REPORTED DUE TO COLOR INTERFERENCE OF URINE PIGMENT   Nitrite   (*)    Value: TEST NOT REPORTED DUE TO COLOR INTERFERENCE OF URINE PIGMENT   Leukocytes,Ua   (*)    Value: TEST NOT REPORTED DUE TO COLOR INTERFERENCE OF URINE PIGMENT   Bacteria, UA RARE (*)    All other components within normal limits  HEPATIC FUNCTION PANEL - Abnormal; Notable for the following components:   Total Protein 4.9 (*)    Albumin 2.7 (*)    AST 11 (*)    All other components within normal limits  CBG MONITORING, ED - Abnormal; Notable for the following components:   Glucose-Capillary 309 (*)    All other components within normal limits  CBG MONITORING, ED - Abnormal; Notable for the following components:   Glucose-Capillary 184 (*)    All other components within normal limits  URINE CULTURE  CBG MONITORING, ED  CBG MONITORING, ED    EKG None  Radiology CT Renal Stone Study  Result Date: 02/05/2022 CLINICAL DATA:  Probable urinary tract infection.  Flank pain. EXAM: CT ABDOMEN AND PELVIS WITHOUT CONTRAST TECHNIQUE: Multidetector CT imaging of the abdomen and pelvis was performed following the standard protocol without IV contrast. RADIATION DOSE REDUCTION: This exam was performed according to the departmental dose-optimization program which includes automated exposure control, adjustment of the mA and/or kV according to patient size and/or use of iterative reconstruction technique. COMPARISON:  07/13/2021 abdominal ultrasound. Most recent CT 06/02/2019 FINDINGS: Lower chest: Emphysema. Normal heart size without pericardial or pleural  effusion. Hepatobiliary: Moderate cirrhosis. Low sensitivity for focal liver lesion given lack of IV contrast. Cholecystectomy, without biliary ductal dilatation. Pancreas: Pancreatic atrophy, without duct dilatation or acute inflammation. Spleen: Normal in size, without focal abnormality. Adrenals/Urinary Tract: Normal adrenal glands. No renal calculi or hydronephrosis. No hydroureter or ureteric calculi. Bladder is decompressed. Stomach/Bowel: Lap band is appropriately positioned. Colonic stool burden suggests constipation. Normal terminal ileum. Appendectomy by report. Normal small bowel. Vascular/Lymphatic: Aortic atherosclerosis. No abdominopelvic adenopathy. Reproductive: Normal uterus and adnexa. Other: No significant free fluid. Mild pelvic floor laxity. No free intraperitoneal air. Musculoskeletal: Degenerative changes of both hips are relatively mild, greater on the right. Degenerative disc disease at the lumbosacral junction. IMPRESSION: 1. No urinary tract calculi or hydronephrosis. 2. Possible constipation. 3. Cirrhosis. 4. Aortic Atherosclerosis (ICD10-I70.0) and Emphysema (ICD10-J43.9). Electronically Signed   By: Abigail Miyamoto M.D.   On: 02/05/2022 21:41    Procedures Procedures    Medications Ordered in ED Medications  cefdinir (OMNICEF) capsule 600 mg (has no administration in time range)  sodium chloride 0.9 % bolus 1,000 mL (1,000 mLs Intravenous New Bag/Given 02/05/22 2302)    ED Course/ Medical Decision Making/ A&P                           Medical Decision Making  66 year old female withDiabetes and rheumatoid arthritis who presents with concern for hyperglycemia in context of prednisone taper for RA.  Hypertensive on intake exquisitely did 206/119, tachycardic.  Vital signs otherwise normal.  At time my evaluation vitals have significantly proved with pressure 159/89 at the bedside.  Cardiopulmonary and abdominal exams are benign.  Patient is neurovascular intact in all  extremities with skin changes suggestive of chronic venous stasis in bilateral lower extremities without any calf pain.   Amount and/or Complexity of Data Reviewed Labs: ordered.    Details: CBC with mild leukocytosis of 12,000 likely reactive secondary to prednisone.  BMP with AKI with creatinine of 1.1 elevated from baseline of 0.8.  CBG significantly improved to 184. Radiology: independent interpretation performed.    Details: CT renal study with cirrhosis and possible constipation but without acute renal abnormality  Risk Prescription drug management.    Hepatic function panel for low protein and albumin, UA hazy but with 21-50 WBCs and rare bacteria; contact the patient who is symptomatic with urinary symptoms we will proceed with treatment for UTI at this time.  Versus of antibiotics administered in the emergency department will discharge with prescription for antibiotics as well.  Patient CBG significantly improved after IV fluids, now below 200.  She is in a challenging situation managing her sugars in context of necessity for prednisone with recently diagnosed rheumatoid arthritis.  Recommend to continue to take her insulin as prescribed and follow-up closely with both her endocrinologist and her rheumatologist.  There is no further work-up warranted in the emergency department at this time given hemodynamic stability and overall reassuring laboratory studies.  Encouraged her to follow-up with her PCP for recheck of her creatinine later this week.  Clinical concern for more emergent underlying etiology that would warrant further ED work-up or inpatient management is exceedingly low.  Diane and her husband  voiced understanding of her medical evaluation and treatment plan. Each of their questions answered to their expressed satisfaction.  Return precautions were given.  Patient is well-appearing, stable, and was discharged in good condition.  This chart was dictated using voice recognition  software, Dragon. Despite the best efforts of this provider to proofread and correct errors, errors may still occur which can change documentation meaning.   Final Clinical Impression(s) / ED Diagnoses Final diagnoses:  AKI (acute kidney injury) ([place])  Lower urinary tract infectious disease    Rx / DC Orders ED Discharge Orders          Ordered    cefUROXime (CEFTIN) 500 MG tablet  2 times daily with meals        02/06/22 0039              Javin Nong, Gypsy Balsam, PA-C 02/06/22 0043    Drenda Freeze, MD 02/08/22 1500

## 2022-02-05 NOTE — ED Triage Notes (Addendum)
Patient states she went to see her rheumatologist today for blood work. Patient states her blood work stated that her glucose  was over 600.  CBG- 309 in triage. Patient states she has been taking Prednisone and just started Humira.  Patient also c/o lower back pain and states, "I think it my kidneys." Patient states she has been taking Azo with no relief.

## 2022-02-05 NOTE — Telephone Encounter (Signed)
Pt called to state that her blood sugar is 600 and she was told to go to ED. She stated she did not want to go to [facility] and wondered if there was another option. I informed her of [facility]. She stated she would go there.

## 2022-02-06 MED ORDER — CEFDINIR 300 MG PO CAPS
300.0000 mg | ORAL_CAPSULE | ORAL | Status: DC
  Filled 2022-02-06: qty 1

## 2022-02-06 MED ORDER — CEFUROXIME AXETIL 500 MG PO TABS: 500.0000 mg | ORAL_TABLET | Freq: Two times a day (BID) | ORAL | 0 refills | Status: AC

## 2022-02-06 NOTE — Discharge Instructions (Addendum)
You are seen in the ER today for your high blood sugar.  This significantly improved throughout her stay in the ER and with fluids.  Please continue to take your previously prescribed diabetic medications and follow-up closely in the outpatient setting both with your endocrinologist and your rheumatologist to further discuss the very difficult management of your diabetes in context of your necessity for prednisone.  Please continue take all your medications as prescribed by her specialist and follow-up in the ER with any new severe symptoms.  Please continue to check your blood sugars regularly, follow up with your PCP for recheck of your kidney function.

## 2022-02-08 LAB — URINE CULTURE: Culture: >=100000 — AB

## 2022-02-09 ENCOUNTER — Telehealth: Payer: Self-pay | Admitting: Emergency Medicine

## 2022-02-09 DIAGNOSIS — J449 Chronic obstructive pulmonary disease, unspecified: Secondary | ICD-10-CM | POA: Diagnosis not present

## 2022-02-09 NOTE — Telephone Encounter (Signed)
Post ED Visit - Positive Culture Follow-up  Culture report reviewed by antimicrobial stewardship pharmacist: [facility] Team '[]'$  Elenor Quinones, Pharm.D. '[]'$  Heide Guile, Pharm.D., BCPS AQ-ID '[]'$  Parks Neptune, Pharm.D., BCPS '[]'$  Alycia Rossetti, Pharm.D., BCPS '[]'$  Bradley, Pharm.D., BCPS, AAHIVP '[]'$  Legrand Como, Pharm.D., BCPS, AAHIVP '[]'$  Salome Arnt, PharmD, BCPS '[]'$  Johnnette Gourd, PharmD, BCPS '[]'$  Hughes Better, PharmD, BCPS '[]'$  Leeroy Cha, PharmD '[]'$  Laqueta Linden, PharmD, BCPS '[]'$  Albertina Parr, PharmD  [place] Team '[]'$  Leodis Sias, PharmD '[]'$  Lindell Spar, PharmD '[]'$  Royetta Asal, PharmD '[]'$  Graylin Shiver, Rph '[]'$  Rema Fendt) Glennon Mac, PharmD '[]'$  Arlyn Dunning, PharmD '[]'$  Netta Cedars, PharmD '[]'$  Dia Sitter, PharmD '[]'$  Leone Haven, PharmD '[]'$  Gretta Arab, PharmD '[]'$  Theodis Shove, PharmD '[]'$  Peggyann Juba, PharmD '[]'$  Reuel Boom, PharmD   Positive urine culture Treated with cefuroxime, organism sensitive to the same and no further patient follow-up is required at this time.  Hazle Nordmann 02/09/2022, 10:26 AM

## 2022-02-13 ENCOUNTER — Telehealth: Payer: Self-pay | Admitting: Adult Health

## 2022-02-13 DIAGNOSIS — J449 Chronic obstructive pulmonary disease, unspecified: Secondary | ICD-10-CM | POA: Diagnosis not present

## 2022-02-13 MED ORDER — SIMVASTATIN 20 MG PO TABS: 20.0000 mg | ORAL_TABLET | Freq: Every day | ORAL | 0 refills | Status: DC

## 2022-02-13 NOTE — Telephone Encounter (Signed)
*  STAT* If patient is at the pharmacy, call can be transferred to refill team.   1. Which medications need to be refilled? (please list name of each medication and dose if known)  simvastatin (ZOCOR) 20 MG tablet  2. Which pharmacy/location (including street and city if local pharmacy) is medication to be sent to?  [facility] - [place], [place] - [place] [address]  3. Do they need a 30 day or 90 day supply?  30 days

## 2022-02-15 ENCOUNTER — Encounter: Payer: Self-pay | Admitting: Internal Medicine

## 2022-02-18 ENCOUNTER — Other Ambulatory Visit: Payer: Self-pay | Admitting: Internal Medicine

## 2022-02-18 ENCOUNTER — Other Ambulatory Visit: Payer: Self-pay | Admitting: Cardiology

## 2022-02-18 MED ORDER — COMBIVENT RESPIMAT 20-100 MCG/ACT IN AERS: 1.0000 | INHALATION_SPRAY | Freq: Four times a day (QID) | RESPIRATORY_TRACT | 5 refills | Status: DC | PRN

## 2022-02-19 ENCOUNTER — Telehealth: Payer: Medicare HMO

## 2022-02-28 NOTE — Progress Notes (Signed)
Cardiology Clinic Note   Patient Name: Mary Acosta Date of Encounter: 03/01/2022  Primary Care Provider:  Binnie Rail, MD Primary Cardiologist:  Kirk Ruths, MD  Patient Profile    66 year old female  with a history of diastolic heart failure, morbid obesity, sleep apnea intolerant to CPAP, chronic pain syndrome on narcotics, COPD and pulmonary hypertension, insulin-dependent diabetes, and a history of PSVT, cirrhosis, .  In April 2019 she presented with PSVT with a right bundle branch block.  She was treated with beta-blockers,   Prior cardiac studies include cardiac catheterization in 2006 that showed normal coronaries.  Coronary CT in May 2012 that showed a calcium score of 0.   Echocardiogram September 2019 showed ejection fraction of 60 to 65% with grade 1 diastolic dysfunction. Recently seen in ED for hyperglycemia in the context of prednisone taper for RA.   Past Medical History    Past Medical History:  Diagnosis Date   AKI (acute kidney injury) ([place]) 01/2017   Allergy    seasonal   Anxiety    with panic attacks   Arthritis    "back; feet; hands; shoulders" (08/26/2014)   Asthma    Cataract    forming   Cervical cancer ([place])    Chronic lower back pain    Chronic narcotic use    Chronic pain syndrome    [facility]   Cirrhosis ([place])    Clostridium difficile infection 2017   COPD (chronic obstructive pulmonary disease) (HCC)    Daily headache    past hx   Depression    Diabetic neuropathy ([place]) 06/04/2017   feet  and legs , some in hands   DJD (degenerative joint disease)    Fatty liver disease, nonalcoholic    Fibromyalgia    Frequency of urination    HCAP (healthcare-associated pneumonia) 08/26/2014   History of TIA (transient ischemic attack) 11/01/2010   NO RESIDUAL   Hyperlipidemia    Hypertension    Hypothyroidism    IDDM (insulin dependent diabetes mellitus)    Insomnia    Lumbar stenosis    Macular degeneration     Memory difficulty 07/25/2016   Nocturia    OSA (obstructive sleep apnea)    NO CPAP SINCE WT LOSS   Osteoarthritis    with severe disease in knee   Oxygen deficiency    4  liters at bedtime only   Pneumonia "several times"   Polymyalgia rheumatica ([place])    Scoliosis    Seasonal allergies    Stroke [facility])    TIA   Thyroid cancer ([place])    Urgency of urination    Vaginal pain S/P SLING  FEB 2012   Past Surgical History:  Procedure Laterality Date   APPENDECTOMY  1982   BREAST EXCISIONAL BIOPSY Left 02/28/2005   Atypical Ductal Hyperplasia   CARDIAC CATHETERIZATION  09/04/2004   NORMAL CORONARY ANATOMY/ NORMAL LVF/ EF 60%   CARDIOVASCULAR STRESS TEST  12-27-2010  DR [place]   ABNORMAL NUCLEAR STUDY W/ /MILD INFERIOR ISCHEMIA/ EF 69%/  CT HEART ANGIOGRAM ;  NO ACUTE FINDINGS   COLONOSCOPY     CRYOABLATION  05/16/2003   w/LEEP FOR ABNORMAL PAP SMEAR   CYSTOSCOPY  05/18/2012   Procedure: CYSTOSCOPY;  Surgeon: Reece Packer, MD;  Location: [place];  Service: Urology;  Laterality: N/A;  examination under anethesia   ESOPHAGOGASTRODUODENOSCOPY (EGD) WITH PROPOFOL N/A 09/03/2016   Procedure: ESOPHAGOGASTRODUODENOSCOPY (EGD) WITH PROPOFOL;  Surgeon: Doran Stabler, MD;  Location: [facility];  Service: Gastroenterology;  Laterality: N/A;   ESOPHAGOGASTRODUODENOSCOPY (EGD) WITH PROPOFOL N/A 11/19/2021   Procedure: ESOPHAGOGASTRODUODENOSCOPY (EGD) WITH PROPOFOL;  Surgeon: Doran Stabler, MD;  Location: [facility];  Service: Gastroenterology;  Laterality: N/A;  Varices screeing, cirrhosis   HYSTEROSCOPY WITH D & C  08/19/2007   PMB   KNEE ARTHROSCOPY W/ DEBRIDEMENT Left 03/29/2006   INTERNAL DERANGEMENT/ SEVERE DJD/ MENISCUS TEARS   LAPAROSCOPIC CHOLECYSTECTOMY  06/10/2002   LAPAROSCOPIC GASTRIC BANDING  03/01/2006   TRUNCAL VAGOTOMY/ PLACEMENT OF VG BAND   REVISION TOTAL KNEE ARTHROPLASTY Left 08-29-2008; 05/2009   TONSILLECTOMY  1969   TOTAL KNEE  ARTHROPLASTY Left 01/23/2007   SEVERE DJD   TOTAL THYROIDECTOMY  11/22/2005   BILATERAL THYROID NODULES-- PAPILLARY CARCINOMA (0.5CM)/ ADENOMATOID NODULES   TRANSTHORACIC ECHOCARDIOGRAM  12/27/2010   LVSF NORMAL / EF 16-10%/ GRADE I DIASTOLIC DYSFUNCTION/ MILD MITRAL REGURG. / MILDLY DILATED LEFT ATRIUM/ MILDY INCREASED SYSTOLIC PRESSURE OF PULMONARY ARTERIES   TRANSVAGINAL SUBURETERAL TAPE/ SLING  09/28/2010   MIXED URINARY INCONTINENCE   TUBAL LIGATION  1983    Allergies  Allergies  Allergen Reactions   Gabapentin Swelling    Swelling in legs    Losartan Other (See Comments)    Myalgias and muscle cramping    Aricept [Donepezil Hcl]     Nausea/vomiting, low BP   Hydroxyzine     hallucinations   Oxycodone Itching   Sulfa Antibiotics Nausea Only and Rash   Sulfonamide Derivatives Nausea Only and Rash    History of Present Illness    Mary Acosta is being seen for ongoing assessment and management of chronic diastolic CHF, and history of PSVT with RBBB.  She comes today with multiple complaints related to rheumatoid arthritis symptoms.  She is on prednisone which is causing her to have a lot of hot flashes, and she also reports that her blood pressure rises on occasion to 200/110 which she believes is related to the prednisone.  She has spoken with her rheumatologist who is weaning her some from her prednisone but would like to keep her on a maintenance dose.  She denies any cardiac symptoms, is not very active and is here in a wheelchair due to her rheumatoid arthritis.  Therefore exertional chest discomfort cannot be assessed.  She denies any rapid heart rhythm, palpitations, significant shortness of breath.  No symptoms of volume overload or complaints of PND or orthopnea.   I have reviewed her most recent labs completed by rheumatology via [facility].  Labs are within normal limits without evidence of anemia, or electrolyte abnormalities with exception of GFR 52.    Home  Medications    Current Outpatient Medications  Medication Sig Dispense Refill   acetaminophen (TYLENOL) 500 MG tablet Take 1,000 mg by mouth every 6 (six) hours as needed for moderate pain.     Adalimumab (HUMIRA PEN) 40 MG/0.4ML PNKT Inject 40 mg into the skin every 14 (fourteen) days. Every other Thursday     albuterol (VENTOLIN HFA) 108 (90 Base) MCG/ACT inhaler TAKE 2 PUFFS BY MOUTH EVERY 6 HOURS AS NEEDED FOR WHEEZE OR SHORTNESS OF BREATH (Patient taking differently: Inhale 2 puffs into the lungs every 6 (six) hours as needed for wheezing or shortness of breath.) 18 g 11   amLODipine (NORVASC) 5 MG tablet TAKE ONE TABLET BY MOUTH EVERY MORNING and TAKE ONE TABLET BY MOUTH EVERYDAY AT BEDTIME 60 tablet 0   aspirin  81 MG chewable tablet Chew 81 mg by mouth daily.     busPIRone (BUSPAR) 15 MG tablet Take 15 mg by mouth daily as needed (for anxiety).     ergocalciferol (VITAMIN D2) 1.25 MG (50000 UT) capsule Take 50,000 Units by mouth once a week. Monday     fexofenadine (ALLEGRA) 180 MG tablet Take 180 mg by mouth daily as needed for allergies or rhinitis.     folic acid (FOLVITE) 1 MG tablet Take 1 mg by mouth daily.     furosemide (LASIX) 40 MG tablet TAKE ONE TABLET BY MOUTH every morning (Patient taking differently: Take 40 mg by mouth daily as needed for edema.) 30 tablet 5   gabapentin (NEURONTIN) 300 MG capsule Take 300 mg by mouth 3 (three) times daily as needed (pain).     hydrALAZINE (APRESOLINE) 25 MG tablet TAKE ONE TABLET BY MOUTH EVERY MORNING and TAKE ONE TABLET BY MOUTH EVERYDAY AT BEDTIME (Patient taking differently: Take 25 mg by mouth 2 (two) times daily.) 90 tablet 5   hydrOXYzine (VISTARIL) 50 MG capsule Take 50 mg by mouth 2 (two) times daily as needed for itching or anxiety.     Insulin Glargine (BASAGLAR KWIKPEN) 100 UNIT/ML Inject 80 Units into the skin at bedtime. (Patient taking differently: Inject 160 Units into the skin at bedtime. Divided into 2 separate injections)      Insulin Pen Needle (PEN NEEDLES) 31G X 8 MM MISC 1 Device by Does not apply route daily. 90 each 3   Insulin Syringe-Needle U-100 26G X 1/2" 1 ML MISC Use daily for insulin injection as directed 100 each 3   Ipratropium-Albuterol (COMBIVENT RESPIMAT) 20-100 MCG/ACT AERS respimat Inhale 1 puff into the lungs every 6 (six) hours as needed for wheezing. 4 g 5   ipratropium-albuterol (DUONEB) 0.5-2.5 (3) MG/3ML SOLN Take 3 mLs by nebulization every 6 (six) hours as needed (Wheezing or dyspnea.). 360 mL 11   levothyroxine (SYNTHROID) 150 MCG tablet Take 1 tablet (150 mcg total) by mouth daily. 90 tablet 3   metFORMIN (GLUCOPHAGE) 500 MG tablet Take 1 tablet (500 mg total) by mouth 2 (two) times daily with a meal. (Patient taking differently: Take 500 mg by mouth daily with breakfast.) 180 tablet 3   metoprolol tartrate (LOPRESSOR) 25 MG tablet TAKE ONE TABLET BY MOUTH AT BREAKFAST AND AT BEDTIME 30 tablet 1   morphine (MS CONTIN) 15 MG 12 hr tablet Take 15 mg by mouth daily. May take 1 additional tablet in the evening as needed for pain     naloxone (NARCAN) nasal spray 4 mg/0.1 mL One spray in either nostril once for known/suspected opioid overdose. May repeat every 2-3 minutes in alternating nostril til EMS arrives     OXYGEN Inhale 3 L into the lungs at bedtime.     PARoxetine (PAXIL) 40 MG tablet Take 1 tablet (40 mg total) by mouth daily. 90 tablet 1   potassium chloride SA (KLOR-CON M20) 20 MEQ tablet TAKE 2 TABLETS EVERY DAY AS NEEDED FOR CRAMPING (Patient taking differently: Take 40 mEq by mouth daily as needed (for cramping).) 180 tablet 1   predniSONE (DELTASONE) 5 MG tablet Take 20 mg by mouth daily.     promethazine (PHENERGAN) 25 MG tablet Take 0.5 tablets (12.5 mg total) by mouth every 8 (eight) hours as needed for nausea or vomiting. 30 tablet 1   Propylene Glycol (SYSTANE BALANCE) 0.6 % SOLN Place 1 drop into both eyes daily as needed (dry eyes).  Semaglutide, 1 MG/DOSE, (OZEMPIC, 1  MG/DOSE,) 4 MG/3ML SOPN Inject 1 mg into the skin once a week. (Patient taking differently: Inject 1 mg into the skin every Monday.) 9 mL 3   simvastatin (ZOCOR) 20 MG tablet Take 1 tablet (20 mg total) by mouth daily at 6 PM. PATIENT MUST SCHEDULE APPOINTMENT FOR FUTURE REFILLS. SECOND ATTEMPT 15 tablet 0   tiZANidine (ZANAFLEX) 4 MG tablet Take 4 mg by mouth every 8 (eight) hours as needed for muscle spasms.     traZODone (DESYREL) 150 MG tablet Take 150 mg by mouth at bedtime as needed for sleep.     No current facility-administered medications for this visit.     Family History    Family History  Problem Relation Age of Onset   Colon polyps Mother    Diabetes Mother    Heart disease Mother    Dementia Mother    Heart disease Father    High blood pressure Father    Colon cancer Maternal Uncle        x 3   Stomach cancer Paternal Uncle    Breast cancer Other        great aunts x 5   Esophageal cancer Neg Hx    Rectal cancer Neg Hx    She indicated that her mother is deceased. She indicated that her father is deceased. She indicated that two of her three sisters are alive. She indicated that her brother is deceased. She indicated that the status of her maternal uncle is unknown. She indicated that the status of her paternal uncle is unknown. She indicated that the status of her neg hx is unknown. She indicated that the status of her other is unknown.  Social History    Social History   Socioeconomic History   Marital status: Married    Spouse name: Not on file   Number of children: 2   Years of education: 12   Highest education level: Not on file  Occupational History   Occupation: disabled    Employer: UNEMPLOYED  Tobacco Use   Smoking status: Former    Packs/day: 2.50    Years: 39.00    Total pack years: 97.50    Types: Cigarettes    Quit date: 10/22/2010    Years since quitting: 11.3   Smokeless tobacco: Never  Vaping Use   Vaping Use: Never used  Substance and  Sexual Activity   Alcohol use: No    Alcohol/week: 0.0 standard drinks of alcohol   Drug use: No   Sexual activity: Not Currently  Other Topics Concern   Not on file  Social History Narrative   Lives at home w/ her husband    Right-handed   Caffeine: 1 cup of coffee per week + [employer]   Social Determinants of Health   Financial Resource Strain: Medium Risk (10/05/2020)   Overall Financial Resource Strain (CARDIA)    Difficulty of Paying Living Expenses: Somewhat hard  Food Insecurity: No Food Insecurity (05/28/2021)   Hunger Vital Sign    Worried About Running Out of Food in the Last Year: Never true    [place] in the Last Year: Never true  Transportation Needs: No Transportation Needs (05/28/2021)   PRAPARE - Hydrologist (Medical): No    Lack of Transportation (Non-Medical): No  Physical Activity: Inactive (05/28/2021)   Exercise Vital Sign    Days of Exercise per Week: 0 days    Minutes  of Exercise per Session: 0 min  Stress: Stress Concern Present (05/28/2021)   [place]    Feeling of Stress : To some extent  Social Connections: Moderately Integrated (05/28/2021)   Social Connection and Isolation Panel [NHANES]    Frequency of Communication with Friends and Family: More than three times a week    Frequency of Social Gatherings with Friends and Family: More than three times a week    Attends Religious Services: More than 4 times per year    Active Member of [employer] or Organizations: No    Attends Music therapist: More than 4 times per year    Marital Status: Widowed  Intimate Partner Violence: Not At Risk (05/28/2021)   Humiliation, Afraid, Rape, and Kick questionnaire    Fear of Current or Ex-Partner: No    Emotionally Abused: No    Physically Abused: No    Sexually Abused: No     Review of Systems    General:  No chills, fever, night sweats or weight  changes.  Cardiovascular:  No chest pain, dyspnea on exertion, edema, orthopnea, palpitations, paroxysmal nocturnal dyspnea. Dermatological: No rash, lesions/masses Respiratory: No cough, dyspnea Urologic: No hematuria, dysuria Abdominal:   No nausea, vomiting, diarrhea, bright red blood per rectum, melena, or hematemesis Neurologic:  No visual changes, wkns, changes in mental status. All other systems reviewed and are otherwise negative except as noted above.     Physical Exam    VS:  BP 127/85   Pulse 63   Ht '5\' 9"'$  (1.753 m)   Wt 291 lb (132 kg)   SpO2 95%   BMI 42.97 kg/m  , BMI Body mass index is 42.97 kg/m.     GEN: Well nourished, well developed, in no acute distress. HEENT: normal. Neck: Supple, no JVD, carotid bruits, or masses. Cardiac: RRR, occasional extrasystole, no murmurs, rubs, or gallops. No clubbing, cyanosis, edema.  Radials/DP/PT 2+ and equal bilaterally.  Respiratory:  Respirations regular and unlabored, clear to auscultation bilaterally. GI: Soft, nontender, nondistended, BS + x 4. MS: no deformity or atrophy. Skin: warm and dry, no rash.  Easily diaphoretic Neuro: Strength is diminished due to chronic pain and inactivity. Psych: Normal affect.  Accessory Clinical Findings    ECG personally reviewed by me today-sinus rhythm with PACs,, right bundle branch block, heart rate of 63 bpm, PR interval 188 ms.- No acute changes  Lab Results  Component Value Date   WBC 12.4 (H) 02/05/2022   HGB 14.1 02/05/2022   HCT 44.9 02/05/2022   MCV 81.6 02/05/2022   PLT 222 02/05/2022   Lab Results  Component Value Date   CREATININE 1.12 (H) 02/05/2022   BUN 26 (H) 02/05/2022   NA 139 02/05/2022   K 4.0 02/05/2022   CL 101 02/05/2022   CO2 26 02/05/2022   Lab Results  Component Value Date   ALT 13 02/05/2022   AST 11 (L) 02/05/2022   ALKPHOS 43 02/05/2022   BILITOT 0.7 02/05/2022   Lab Results  Component Value Date   CHOL 183 10/09/2021   HDL 52.90  10/09/2021   LDLCALC 104 (H) 10/09/2021   TRIG 127.0 10/09/2021   CHOLHDL 3 10/09/2021    Lab Results  Component Value Date   HGBA1C 8.1 (A) 10/09/2021    Review of Prior Studies:   Assessment & Plan   1.  Hypertension: She remains on amlodipine 5 mg in the morning and 5 mg  at at bedtime, hydralazine twice daily, and Lasix 40 mg as needed.  She states that on occasion blood pressure is elevated to 200/110.  She states she can feel it in her head feels full   I have advised her that she may take an additional hydralazine 25 mg for elevated blood pressure sustained greater than 30 minutes to an hour.  We have called her pharmacy and she is receiving bubble packs for her medications however an additional bottle of as needed hydralazine is being provided.  If blood pressure remains elevated she is to report to ED for further treatment.  2.  PSVT: EKG does reveal PACs, but she is unaware of any rapid heart rhythm or significant palpitations.  We will continue her on metoprolol 25 mg twice daily.  Heart rate is well controlled currently.  3. Hypercholesterolemia: Refills on simvastatin 20 mg daily are provided.  Goal of LDL less than 70 for primary prevention in the setting of multiple cardiovascular risk factors to include hypertension, diabetes, obesity.  4.   Rheumatoid arthritis: Being followed closely by rheumatology.  She continues on Humira along with prednisone, pain control, and labs.  Defer to rheumatology for management.  5.  Chronic diastolic heart failure: Continue Lasix and potassium replacement as directed.  No evidence of volume overload.  6.  COPD: Ongoing management by PCP.  Continue regimen of inhalers.  7. Type II Diabetes: Difficult to control on prednisone with episodes of hyperglycemia requiring ER evaluation.  She is now on sliding scale insulin with regular insulin.    Current medicines are reviewed at length with the patient today.  I have spent 25 min's  dedicated  to the care of this patient on the date of this encounter to include pre-visit review of records, assessment, management and diagnostic testing,with shared decision making.   Signed, Phill Myron. West Pugh, ANP, AACC   03/01/2022 4:00 PM    [facility] [address] Office [phone] Fax [phone]  Notice: This dictation was prepared with Dragon dictation along with smaller phrase technology. Any transcriptional errors that result from this process are unintentional and may not be corrected upon review.

## 2022-03-01 ENCOUNTER — Ambulatory Visit: Payer: Medicare HMO | Admitting: Adult Health

## 2022-03-01 ENCOUNTER — Encounter: Payer: Self-pay | Admitting: Adult Health

## 2022-03-01 VITALS — BP 127/85 | HR 63 | Ht 69.0 in | Wt 291.0 lb

## 2022-03-01 DIAGNOSIS — I471 Supraventricular tachycardia, unspecified: Secondary | ICD-10-CM

## 2022-03-01 DIAGNOSIS — E7800 Pure hypercholesterolemia, unspecified: Secondary | ICD-10-CM | POA: Diagnosis not present

## 2022-03-01 DIAGNOSIS — I5032 Chronic diastolic (congestive) heart failure: Secondary | ICD-10-CM

## 2022-03-01 DIAGNOSIS — F332 Major depressive disorder, recurrent severe without psychotic features: Secondary | ICD-10-CM | POA: Diagnosis not present

## 2022-03-01 DIAGNOSIS — R69 Illness, unspecified: Secondary | ICD-10-CM | POA: Diagnosis not present

## 2022-03-01 DIAGNOSIS — F419 Anxiety disorder, unspecified: Secondary | ICD-10-CM | POA: Diagnosis not present

## 2022-03-01 DIAGNOSIS — M0600 Rheumatoid arthritis without rheumatoid factor, unspecified site: Secondary | ICD-10-CM | POA: Diagnosis not present

## 2022-03-01 DIAGNOSIS — I10 Essential (primary) hypertension: Secondary | ICD-10-CM | POA: Diagnosis not present

## 2022-03-01 DIAGNOSIS — G4733 Obstructive sleep apnea (adult) (pediatric): Secondary | ICD-10-CM

## 2022-03-01 MED ORDER — SIMVASTATIN 20 MG PO TABS: 20.0000 mg | ORAL_TABLET | Freq: Every day | ORAL | 2 refills | Status: DC

## 2022-03-01 MED ORDER — HYDRALAZINE HCL 25 MG PO TABS: 25.0000 mg | ORAL_TABLET | Freq: Two times a day (BID) | ORAL | 2 refills | Status: DC

## 2022-03-01 NOTE — Patient Instructions (Signed)
Medication Instructions:  No Changes *If you need a refill on your cardiac medications before your next appointment, please call your pharmacy*   Lab Work: No Labs If you have labs (blood work) drawn today and your tests are completely normal, you will receive your results only by: [place] (if you have MyChart) OR A paper copy in the mail If you have any lab test that is abnormal or we need to change your treatment, we will call you to review the results.   Testing/Procedures: No Testing   Follow-Up: At [facility], you and your health needs are our priority.  As part of our continuing mission to provide you with exceptional heart care, we have created designated Provider Care Teams.  These Care Teams include your primary Cardiologist (physician) and Advanced Practice Providers (APPs -  Physician Assistants and Nurse Practitioners) who all work together to provide you with the care you need, when you need it.  We recommend signing up for the patient portal called "MyChart".  Sign up information is provided on this After Visit Summary.  MyChart is used to connect with patients for Virtual Visits (Telemedicine).  Patients are able to view lab/test results, encounter notes, upcoming appointments, etc.  Non-urgent messages can be sent to your provider as well.   To learn more about what you can do with MyChart, go to [email or web address].    Your next appointment:   6 month(s)  The format for your next appointment:   In Person  Provider:   Kirk Ruths, MD       Important Information About Sugar

## 2022-03-07 DIAGNOSIS — M25561 Pain in right knee: Secondary | ICD-10-CM | POA: Diagnosis not present

## 2022-03-07 DIAGNOSIS — M25562 Pain in left knee: Secondary | ICD-10-CM | POA: Diagnosis not present

## 2022-03-07 DIAGNOSIS — G894 Chronic pain syndrome: Secondary | ICD-10-CM | POA: Diagnosis not present

## 2022-03-07 DIAGNOSIS — M5137 Other intervertebral disc degeneration, lumbosacral region: Secondary | ICD-10-CM | POA: Diagnosis not present

## 2022-03-07 DIAGNOSIS — Z79891 Long term (current) use of opiate analgesic: Secondary | ICD-10-CM | POA: Diagnosis not present

## 2022-03-11 DIAGNOSIS — J449 Chronic obstructive pulmonary disease, unspecified: Secondary | ICD-10-CM | POA: Diagnosis not present

## 2022-03-13 ENCOUNTER — Other Ambulatory Visit: Payer: Self-pay | Admitting: Cardiology

## 2022-03-19 ENCOUNTER — Other Ambulatory Visit: Payer: Self-pay | Admitting: Internal Medicine

## 2022-03-25 ENCOUNTER — Telehealth: Payer: Self-pay

## 2022-03-25 ENCOUNTER — Telehealth: Payer: Self-pay | Admitting: Internal Medicine

## 2022-03-25 NOTE — Telephone Encounter (Signed)
Patient has now been informed that we've received 4 boxes of Ozempic via patient assistance. Patient's spouse will be coming to pick it up.

## 2022-03-25 NOTE — Telephone Encounter (Signed)
Spouse picked up Ozempic - patient assistance

## 2022-03-25 NOTE — Telephone Encounter (Signed)
Needs to be evaluated in person-urgent care-if she is having that much back pain she could have a kidney infection and needs to be seen in person

## 2022-03-25 NOTE — Telephone Encounter (Signed)
Pt stated she is sure she has a UTI. Tried to schedule pt an ov. She stated the UTI is causing her so much back and kidney pain that she cannot walk. Pt is requesting a call from nurse. She would like an rx sent to the pharmacy to get rid of the UTI.    Please advise  Pt stated if she does not answer the her home number to call her on the mobile number [phone].

## 2022-03-26 ENCOUNTER — Encounter: Payer: Self-pay | Admitting: Internal Medicine

## 2022-03-26 NOTE — Telephone Encounter (Signed)
Spoke with patient today and info given. She declined to go to urgent care today and wanted to come in tomorrow. Appointment made for tomorrow.  Patient informed to seek treatment today if symptoms became worse.

## 2022-03-26 NOTE — Progress Notes (Unsigned)
Subjective:    Patient ID: Mary Acosta, female    DOB: 1955/09/22, 66 y.o.   MRN: [id number]      HPI Mary Acosta is here for No chief complaint on file.    ? UTI:  Her symptoms started  *** days ago.  She states dysuria, urinary frequency, urinary urgency, hematuria, abdominal pain, back pain, nausea, fever.  She denies other symptoms.      Medications and allergies reviewed with patient and updated if appropriate.  Current Outpatient Medications on File Prior to Visit  Medication Sig Dispense Refill   acetaminophen (TYLENOL) 500 MG tablet Take 1,000 mg by mouth every 6 (six) hours as needed for moderate pain.     Adalimumab (HUMIRA PEN) 40 MG/0.4ML PNKT Inject 40 mg into the skin every 14 (fourteen) days. Every other Thursday     albuterol (VENTOLIN HFA) 108 (90 Base) MCG/ACT inhaler TAKE 2 PUFFS BY MOUTH EVERY 6 HOURS AS NEEDED FOR WHEEZE OR SHORTNESS OF BREATH (Patient taking differently: Inhale 2 puffs into the lungs every 6 (six) hours as needed for wheezing or shortness of breath.) 18 g 11   amLODipine (NORVASC) 5 MG tablet TAKE ONE TABLET BY MOUTH EVERY MORNING and TAKE ONE TABLET BY MOUTH EVERYDAY AT BEDTIME 60 tablet 0   aspirin 81 MG chewable tablet Chew 81 mg by mouth daily.     busPIRone (BUSPAR) 15 MG tablet Take 15 mg by mouth daily as needed (for anxiety).     ergocalciferol (VITAMIN D2) 1.25 MG (50000 UT) capsule Take 50,000 Units by mouth once a week. Monday     fexofenadine (ALLEGRA) 180 MG tablet Take 180 mg by mouth daily as needed for allergies or rhinitis.     folic acid (FOLVITE) 1 MG tablet Take 1 mg by mouth daily.     furosemide (LASIX) 40 MG tablet TAKE ONE TABLET BY MOUTH every morning (Patient taking differently: Take 40 mg by mouth daily as needed for edema.) 30 tablet 5   gabapentin (NEURONTIN) 300 MG capsule Take 300 mg by mouth 3 (three) times daily as needed (pain).     hydrALAZINE (APRESOLINE) 25 MG tablet Take 1 tablet (25 mg total) by  mouth 2 (two) times daily. Can Take Additional 25 mg Tablet for Blood Pressure 170/80 90 tablet 2   hydrOXYzine (VISTARIL) 50 MG capsule Take 50 mg by mouth 2 (two) times daily as needed for itching or anxiety.     Insulin Glargine (BASAGLAR KWIKPEN) 100 UNIT/ML Inject 80 Units into the skin at bedtime. (Patient taking differently: Inject 160 Units into the skin at bedtime. Divided into 2 separate injections)     Insulin Pen Needle (PEN NEEDLES) 31G X 8 MM MISC 1 Device by Does not apply route daily. 90 each 3   Insulin Syringe-Needle U-100 26G X 1/2" 1 ML MISC Use daily for insulin injection as directed 100 each 3   Ipratropium-Albuterol (COMBIVENT RESPIMAT) 20-100 MCG/ACT AERS respimat Inhale 1 puff into the lungs every 6 (six) hours as needed for wheezing. 4 g 5   ipratropium-albuterol (DUONEB) 0.5-2.5 (3) MG/3ML SOLN Take 3 mLs by nebulization every 6 (six) hours as needed (Wheezing or dyspnea.). 360 mL 11   levothyroxine (SYNTHROID) 150 MCG tablet Take 1 tablet (150 mcg total) by mouth daily. 90 tablet 3   metFORMIN (GLUCOPHAGE) 500 MG tablet Take 1 tablet (500 mg total) by mouth 2 (two) times daily with a meal. (Patient taking differently: Take 500 mg by  mouth daily with breakfast.) 180 tablet 3   metoprolol tartrate (LOPRESSOR) 25 MG tablet TAKE ONE TABLET BY MOUTH EVERY MORNING and TAKE ONE TABLET BY MOUTH EVERYDAY AT BEDTIME 30 tablet 1   morphine (MS CONTIN) 15 MG 12 hr tablet Take 15 mg by mouth daily. May take 1 additional tablet in the evening as needed for pain     naloxone (NARCAN) nasal spray 4 mg/0.1 mL One spray in either nostril once for known/suspected opioid overdose. May repeat every 2-3 minutes in alternating nostril til EMS arrives     OXYGEN Inhale 3 L into the lungs at bedtime.     PARoxetine (PAXIL) 40 MG tablet Take 1 tablet (40 mg total) by mouth daily. 90 tablet 1   potassium chloride SA (KLOR-CON M20) 20 MEQ tablet TAKE 2 TABLETS EVERY DAY AS NEEDED FOR CRAMPING (Patient  taking differently: Take 40 mEq by mouth daily as needed (for cramping).) 180 tablet 1   predniSONE (DELTASONE) 5 MG tablet Take 20 mg by mouth daily.     promethazine (PHENERGAN) 25 MG tablet Take 0.5 tablets (12.5 mg total) by mouth every 8 (eight) hours as needed for nausea or vomiting. 30 tablet 1   Propylene Glycol (SYSTANE BALANCE) 0.6 % SOLN Place 1 drop into both eyes daily as needed (dry eyes).     Semaglutide, 1 MG/DOSE, (OZEMPIC, 1 MG/DOSE,) 4 MG/3ML SOPN Inject 1 mg into the skin once a week. (Patient taking differently: Inject 1 mg into the skin every Monday.) 9 mL 3   simvastatin (ZOCOR) 20 MG tablet Take 1 tablet (20 mg total) by mouth daily at 6 PM. 90 tablet 2   tiZANidine (ZANAFLEX) 4 MG tablet Take 4 mg by mouth every 8 (eight) hours as needed for muscle spasms.     traZODone (DESYREL) 150 MG tablet Take 150 mg by mouth at bedtime as needed for sleep.     No current facility-administered medications on file prior to visit.    Review of Systems     Objective:  There were no vitals filed for this visit. BP Readings from Last 3 Encounters:  03/01/22 127/85  02/06/22 (!) 123/58  01/14/22 134/72   Wt Readings from Last 3 Encounters:  03/01/22 291 lb (132 kg)  02/05/22 291 lb (132 kg)  01/14/22 292 lb (132.5 kg)   There is no height or weight on file to calculate BMI.    Physical Exam         Assessment & Plan:    See Problem List for Assessment and Plan of chronic medical problems.

## 2022-03-27 ENCOUNTER — Ambulatory Visit (INDEPENDENT_AMBULATORY_CARE_PROVIDER_SITE_OTHER): Payer: Medicare HMO | Admitting: Internal Medicine

## 2022-03-27 ENCOUNTER — Other Ambulatory Visit: Payer: Self-pay | Admitting: Internal Medicine

## 2022-03-27 ENCOUNTER — Other Ambulatory Visit: Payer: Self-pay

## 2022-03-27 VITALS — BP 140/70 | HR 71 | Temp 98.4°F | Ht 69.0 in | Wt 293.0 lb

## 2022-03-27 DIAGNOSIS — M549 Dorsalgia, unspecified: Secondary | ICD-10-CM | POA: Diagnosis not present

## 2022-03-27 DIAGNOSIS — R300 Dysuria: Secondary | ICD-10-CM

## 2022-03-27 DIAGNOSIS — N3000 Acute cystitis without hematuria: Secondary | ICD-10-CM | POA: Diagnosis not present

## 2022-03-27 DIAGNOSIS — E1165 Type 2 diabetes mellitus with hyperglycemia: Secondary | ICD-10-CM

## 2022-03-27 LAB — POC URINALSYSI DIPSTICK (AUTOMATED)
Bilirubin, UA: 1
Blood, UA: NEGATIVE
Glucose, UA: POSITIVE — AB
Ketones, UA: NEGATIVE
Leukocytes, UA: NEGATIVE
Protein, UA: POSITIVE — AB
Spec Grav, UA: >=1.03 — AB (ref 1.010–1.025)
Urobilinogen, UA: 1 E.U./dL
pH, UA: 6 (ref 5.0–8.0)

## 2022-03-27 MED ORDER — CEPHALEXIN 500 MG PO CAPS: 500.0000 mg | ORAL_CAPSULE | Freq: Two times a day (BID) | ORAL | 0 refills | Status: DC

## 2022-03-27 NOTE — Patient Instructions (Signed)
Take the antibiotic as prescribed.  Take tylenol if needed.     Increase your water intake.   Call if no improvement     Urinary Tract Infection, Adult A urinary tract infection (UTI) is an infection of any part of the urinary tract, which includes the kidneys, ureters, bladder, and urethra. These organs make, store, and get rid of urine in the body. UTI can be a bladder infection (cystitis) or kidney infection (pyelonephritis). What are the causes? This infection may be caused by fungi, viruses, or bacteria. Bacteria are the most common cause of UTIs. This condition can also be caused by repeated incomplete emptying of the bladder during urination. What increases the risk? This condition is more likely to develop if:  You ignore your need to urinate or hold urine for long periods of time.  You do not empty your bladder completely during urination.  You wipe back to front after urinating or having a bowel movement, if you are female.  You are uncircumcised, if you are female.  You are constipated.  You have a urinary catheter that stays in place (indwelling).  You have a weak defense (immune) system.  You have a medical condition that affects your bowels, kidneys, or bladder.  You have diabetes.  You take antibiotic medicines frequently or for long periods of time, and the antibiotics no longer work well against certain types of infections (antibiotic resistance).  You take medicines that irritate your urinary tract.  You are exposed to chemicals that irritate your urinary tract.  You are female.  What are the signs or symptoms? Symptoms of this condition include:  Fever.  Frequent urination or passing small amounts of urine frequently.  Needing to urinate urgently.  Pain or burning with urination.  Urine that smells bad or unusual.  Cloudy urine.  Pain in the lower abdomen or back.  Trouble urinating.  Blood in the urine.  Vomiting or being less hungry than  normal.  Diarrhea or abdominal pain.  Vaginal discharge, if you are female.  How is this diagnosed? This condition is diagnosed with a medical history and physical exam. You will also need to provide a urine sample to test your urine. Other tests may be done, including:  Blood tests.  Sexually transmitted disease (STD) testing.  If you have had more than one UTI, a cystoscopy or imaging studies may be done to determine the cause of the infections. How is this treated? Treatment for this condition often includes a combination of two or more of the following:  Antibiotic medicine.  Other medicines to treat less common causes of UTI.  Over-the-counter medicines to treat pain.  Drinking enough water to stay hydrated.  Follow these instructions at home:  Take over-the-counter and prescription medicines only as told by your health care provider.  If you were prescribed an antibiotic, take it as told by your health care provider. Do not stop taking the antibiotic even if you start to feel better.  Avoid alcohol, caffeine, tea, and carbonated beverages. They can irritate your bladder.  Drink enough fluid to keep your urine clear or pale yellow.  Keep all follow-up visits as told by your health care provider. This is important.  Make sure to: ? Empty your bladder often and completely. Do not hold urine for long periods of time. ? Empty your bladder before and after sex. ? Wipe from front to back after a bowel movement if you are female. Use each tissue one time when you   wipe. Contact a health care provider if:  You have back pain.  You have a fever.  You feel nauseous or vomit.  Your symptoms do not get better after 3 days.  Your symptoms go away and then return. Get help right away if:  You have severe back pain or lower abdominal pain.  You are vomiting and cannot keep down any medicines or water. This information is not intended to replace advice given to you by  your health care provider. Make sure you discuss any questions you have with your health care provider. Document Released: 05/22/2005 Document Revised: 01/24/2016 Document Reviewed: 07/03/2015 [employer] Interactive Patient Education  2018 [employer]   

## 2022-03-27 NOTE — Assessment & Plan Note (Signed)
Acute on chronic Has chronic pain - follows with pain management - coming off pain medication On humira and prednisone for RF Back is different and she feels it is from UTI

## 2022-03-27 NOTE — Assessment & Plan Note (Signed)
Acute Urine dip consistent with UTI Will send urine for culture Take the antibiotic as prescribed.  Keflex 500 mg bid x 1 week Take tylenol if needed.   Increase your water intake.  Call if no improvement   

## 2022-03-29 DIAGNOSIS — F332 Major depressive disorder, recurrent severe without psychotic features: Secondary | ICD-10-CM | POA: Diagnosis not present

## 2022-03-29 DIAGNOSIS — E119 Type 2 diabetes mellitus without complications: Secondary | ICD-10-CM | POA: Diagnosis not present

## 2022-03-29 DIAGNOSIS — F419 Anxiety disorder, unspecified: Secondary | ICD-10-CM | POA: Diagnosis not present

## 2022-03-29 DIAGNOSIS — R69 Illness, unspecified: Secondary | ICD-10-CM | POA: Diagnosis not present

## 2022-03-31 LAB — CULTURE, URINE COMPREHENSIVE

## 2022-04-03 DIAGNOSIS — M25562 Pain in left knee: Secondary | ICD-10-CM | POA: Diagnosis not present

## 2022-04-03 DIAGNOSIS — G894 Chronic pain syndrome: Secondary | ICD-10-CM | POA: Diagnosis not present

## 2022-04-03 DIAGNOSIS — M5137 Other intervertebral disc degeneration, lumbosacral region: Secondary | ICD-10-CM | POA: Diagnosis not present

## 2022-04-03 DIAGNOSIS — M25561 Pain in right knee: Secondary | ICD-10-CM | POA: Diagnosis not present

## 2022-04-10 ENCOUNTER — Other Ambulatory Visit: Payer: Self-pay | Admitting: Internal Medicine

## 2022-04-10 DIAGNOSIS — M8580 Other specified disorders of bone density and structure, unspecified site: Secondary | ICD-10-CM | POA: Diagnosis not present

## 2022-04-10 DIAGNOSIS — E1165 Type 2 diabetes mellitus with hyperglycemia: Secondary | ICD-10-CM | POA: Diagnosis not present

## 2022-04-10 DIAGNOSIS — Z79899 Other long term (current) drug therapy: Secondary | ICD-10-CM | POA: Diagnosis not present

## 2022-04-10 DIAGNOSIS — M1990 Unspecified osteoarthritis, unspecified site: Secondary | ICD-10-CM | POA: Diagnosis not present

## 2022-04-10 NOTE — Telephone Encounter (Signed)
Patient needs to schedule an office visit with Dr Chase Caller. For future refills.

## 2022-04-11 ENCOUNTER — Other Ambulatory Visit: Payer: Self-pay | Admitting: Cardiology

## 2022-04-11 DIAGNOSIS — J449 Chronic obstructive pulmonary disease, unspecified: Secondary | ICD-10-CM | POA: Diagnosis not present

## 2022-04-16 DIAGNOSIS — J449 Chronic obstructive pulmonary disease, unspecified: Secondary | ICD-10-CM | POA: Diagnosis not present

## 2022-04-18 ENCOUNTER — Other Ambulatory Visit: Payer: Self-pay | Admitting: Internal Medicine

## 2022-05-02 ENCOUNTER — Telehealth (INDEPENDENT_AMBULATORY_CARE_PROVIDER_SITE_OTHER): Payer: Medicare HMO | Admitting: Family Medicine

## 2022-05-02 ENCOUNTER — Encounter: Payer: Self-pay | Admitting: Family Medicine

## 2022-05-02 DIAGNOSIS — R110 Nausea: Secondary | ICD-10-CM

## 2022-05-02 DIAGNOSIS — Z8619 Personal history of other infectious and parasitic diseases: Secondary | ICD-10-CM | POA: Diagnosis not present

## 2022-05-02 DIAGNOSIS — R197 Diarrhea, unspecified: Secondary | ICD-10-CM

## 2022-05-02 MED ORDER — DIPHENOXYLATE-ATROPINE 2.5-0.025 MG PO TABS: 2.0000 | ORAL_TABLET | Freq: Four times a day (QID) | ORAL | 0 refills | Status: DC | PRN

## 2022-05-02 NOTE — Progress Notes (Signed)
MyChart Video Visit    Virtual Visit via Video Note   This visit type was conducted due to national recommendations for restrictions regarding the COVID-19 Pandemic (e.g. social distancing) in an effort to limit this patient's exposure and mitigate transmission in our community. This patient is at least at moderate risk for complications without adequate follow up. This format is felt to be most appropriate for this patient at this time. Physical exam was limited by quality of the video and audio technology used for the visit. CMA was able to get the patient set up on a video visit.  Patient location: Home. Patient and provider in visit Provider location: Office  I discussed the limitations of evaluation and management by telemedicine and the availability of in person appointments. The patient expressed understanding and agreed to proceed.  Visit Date: 05/02/2022  Today's healthcare provider: Harland Dingwall, NP-C     Subjective:    Patient ID: Mary Acosta, female    DOB: 02/20/1956, 66 y.o.   MRN: [id number]  Chief Complaint  Patient presents with   Diarrhea    x4 days, started taking 2 imodium  and that didn't help so she went to 3 about 3 times daily and pepto-bismol     HPI  C/o diarrhea x 4 days. 10-15 episodes per day. Watery. No blood or pus. Mild intermittent nausea and abdominal cramps. Denies feeling dehydrated. Drinking fluids.   Taking Imodium 3 times per day and Pepto Bismol. States she has needed Lomotil in the past and requests a prescription for this.  States she has to drive to [place] tomorrow to see her pain management provider and is afraid she cannot make it without slowing down the diarrhea.   Hx of C-diff in 2015. States it does not feel like when she had C-diff.   RA and she is taking prednisone Stopped methotrexate.   Dr. Cruzita Lederer reduced her thyroid medication several months ago and she has not followed up with her.    Denies fever,  chills, dizziness, chest pain, palpitations, shortness of breath, vomiting.  States urine is not dark.    Past Medical History:  Diagnosis Date   AKI (acute kidney injury) ([place]) 01/2017   Allergy    seasonal   Anxiety    with panic attacks   Arthritis    "back; feet; hands; shoulders" (08/26/2014)   Asthma    Cataract    forming   Cervical cancer ([place])    Chronic lower back pain    Chronic narcotic use    Chronic pain syndrome    [facility]   Cirrhosis ([place])    Clostridium difficile infection 2017   COPD (chronic obstructive pulmonary disease) (HCC)    Daily headache    past hx   Depression    Diabetic neuropathy ([place]) 06/04/2017   feet  and legs , some in hands   DJD (degenerative joint disease)    Fatty liver disease, nonalcoholic    Fibromyalgia    Frequency of urination    HCAP (healthcare-associated pneumonia) 08/26/2014   History of TIA (transient ischemic attack) 11/01/2010   NO RESIDUAL   Hyperlipidemia    Hypertension    Hypothyroidism    IDDM (insulin dependent diabetes mellitus)    Insomnia    Lumbar stenosis    Macular degeneration    Memory difficulty 07/25/2016   Nocturia    OSA (obstructive sleep apnea)    NO CPAP SINCE WT  LOSS   Osteoarthritis    with severe disease in knee   Oxygen deficiency    4  liters at bedtime only   Pneumonia "several times"   Polymyalgia rheumatica (HCC)    Scoliosis    Seasonal allergies    Stroke [facility])    TIA   Thyroid cancer ([place])    Urgency of urination    Vaginal pain S/P SLING  FEB 2012    Past Surgical History:  Procedure Laterality Date   APPENDECTOMY  1982   BREAST EXCISIONAL BIOPSY Left 02/28/2005   Atypical Ductal Hyperplasia   CARDIAC CATHETERIZATION  09/04/2004   NORMAL CORONARY ANATOMY/ NORMAL LVF/ EF 60%   CARDIOVASCULAR STRESS TEST  12-27-2010  DR [place]   ABNORMAL NUCLEAR STUDY W/ /MILD INFERIOR ISCHEMIA/ EF 69%/  CT HEART ANGIOGRAM ;  NO ACUTE FINDINGS   COLONOSCOPY      CRYOABLATION  05/16/2003   w/LEEP FOR ABNORMAL PAP SMEAR   CYSTOSCOPY  05/18/2012   Procedure: CYSTOSCOPY;  Surgeon: Reece Packer, MD;  Location: [facility];  Service: Urology;  Laterality: N/A;  examination under anethesia   ESOPHAGOGASTRODUODENOSCOPY (EGD) WITH PROPOFOL N/A 09/03/2016   Procedure: ESOPHAGOGASTRODUODENOSCOPY (EGD) WITH PROPOFOL;  Surgeon: Doran Stabler, MD;  Location: [facility];  Service: Gastroenterology;  Laterality: N/A;   ESOPHAGOGASTRODUODENOSCOPY (EGD) WITH PROPOFOL N/A 11/19/2021   Procedure: ESOPHAGOGASTRODUODENOSCOPY (EGD) WITH PROPOFOL;  Surgeon: Doran Stabler, MD;  Location: [facility];  Service: Gastroenterology;  Laterality: N/A;  Varices screeing, cirrhosis   HYSTEROSCOPY WITH D & C  08/19/2007   PMB   KNEE ARTHROSCOPY W/ DEBRIDEMENT Left 03/29/2006   INTERNAL DERANGEMENT/ SEVERE DJD/ MENISCUS TEARS   LAPAROSCOPIC CHOLECYSTECTOMY  06/10/2002   LAPAROSCOPIC GASTRIC BANDING  03/01/2006   TRUNCAL VAGOTOMY/ PLACEMENT OF VG BAND   REVISION TOTAL KNEE ARTHROPLASTY Left 08-29-2008; 05/2009   TONSILLECTOMY  1969   TOTAL KNEE ARTHROPLASTY Left 01/23/2007   SEVERE DJD   TOTAL THYROIDECTOMY  11/22/2005   BILATERAL THYROID NODULES-- PAPILLARY CARCINOMA (0.5CM)/ ADENOMATOID NODULES   TRANSTHORACIC ECHOCARDIOGRAM  12/27/2010   LVSF NORMAL / EF 41-66%/ GRADE I DIASTOLIC DYSFUNCTION/ MILD MITRAL REGURG. / MILDLY DILATED LEFT ATRIUM/ MILDY INCREASED SYSTOLIC PRESSURE OF PULMONARY ARTERIES   TRANSVAGINAL SUBURETERAL TAPE/ SLING  09/28/2010   MIXED URINARY INCONTINENCE   TUBAL LIGATION  1983    Family History  Problem Relation Age of Onset   Colon polyps Mother    Diabetes Mother    Heart disease Mother    Dementia Mother    Heart disease Father    High blood pressure Father    Colon cancer Maternal Uncle        x 3   Stomach cancer Paternal Uncle    Breast cancer Other        great aunts x 5   Esophageal cancer Neg Hx     Rectal cancer Neg Hx     Social History   Socioeconomic History   Marital status: Married    Spouse name: Not on file   Number of children: 2   Years of education: 12   Highest education level: Not on file  Occupational History   Occupation: disabled    Employer: UNEMPLOYED  Tobacco Use   Smoking status: Former    Packs/day: 2.50    Years: 39.00    Total pack years: 97.50    Types: Cigarettes    Quit date: 10/22/2010    Years since quitting: 83.5  Smokeless tobacco: Never  Vaping Use   Vaping Use: Never used  Substance and Sexual Activity   Alcohol use: No    Alcohol/week: 0.0 standard drinks of alcohol   Drug use: No   Sexual activity: Not Currently  Other Topics Concern   Not on file  Social History Narrative   Lives at home w/ her husband    Right-handed   Caffeine: 1 cup of coffee per week + [employer]   Social Determinants of Health   Financial Resource Strain: Medium Risk (10/05/2020)   Overall Financial Resource Strain (CARDIA)    Difficulty of Paying Living Expenses: Somewhat hard  Food Insecurity: No Food Insecurity (05/28/2021)   Hunger Vital Sign    Worried About Running Out of Food in the Last Year: Never true    Ran Out of Food in the Last Year: Never true  Transportation Needs: No Transportation Needs (05/28/2021)   PRAPARE - Hydrologist (Medical): No    Lack of Transportation (Non-Medical): No  Physical Activity: Inactive (05/28/2021)   Exercise Vital Sign    Days of Exercise per Week: 0 days    Minutes of Exercise per Session: 0 min  Stress: Stress Concern Present (05/28/2021)   [place]    Feeling of Stress : To some extent  Social Connections: Moderately Integrated (05/28/2021)   Social Connection and Isolation Panel [NHANES]    Frequency of Communication with Friends and Family: More than three times a week    Frequency of Social Gatherings with Friends  and Family: More than three times a week    Attends Religious Services: More than 4 times per year    Active Member of [employer] or Organizations: No    Attends Music therapist: More than 4 times per year    Marital Status: Widowed  Intimate Partner Violence: Not At Risk (05/28/2021)   Humiliation, Afraid, Rape, and Kick questionnaire    Fear of Current or Ex-Partner: No    Emotionally Abused: No    Physically Abused: No    Sexually Abused: No    Outpatient Medications Prior to Visit  Medication Sig Dispense Refill   acetaminophen (TYLENOL) 500 MG tablet Take 1,000 mg by mouth every 6 (six) hours as needed for moderate pain.     Adalimumab (HUMIRA PEN) 40 MG/0.4ML PNKT Inject 40 mg into the skin every 14 (fourteen) days. Every other Thursday     albuterol (VENTOLIN HFA) 108 (90 Base) MCG/ACT inhaler TAKE 2 PUFFS BY MOUTH EVERY 6 HOURS AS NEEDED FOR WHEEZE OR SHORTNESS OF BREATH (Patient taking differently: Inhale 2 puffs into the lungs every 6 (six) hours as needed for wheezing or shortness of breath.) 18 g 11   amLODipine (NORVASC) 5 MG tablet TAKE ONE TABLET BY MOUTH EVERY MORNING and TAKE ONE TABLET BY MOUTH EVERYDAY AT BEDTIME 60 tablet 0   aspirin 81 MG chewable tablet Chew 81 mg by mouth daily.     busPIRone (BUSPAR) 15 MG tablet Take 15 mg by mouth daily as needed (for anxiety).     cephALEXin (KEFLEX) 500 MG capsule Take 1 capsule (500 mg total) by mouth 2 (two) times daily. 14 capsule 0   ergocalciferol (VITAMIN D2) 1.25 MG (50000 UT) capsule Take 50,000 Units by mouth once a week. Monday     fexofenadine (ALLEGRA) 180 MG tablet Take 180 mg by mouth daily as needed for allergies or rhinitis.  folic acid (FOLVITE) 1 MG tablet Take 1 mg by mouth daily.     furosemide (LASIX) 40 MG tablet TAKE ONE TABLET BY MOUTH every morning (Patient taking differently: Take 40 mg by mouth daily as needed for edema.) 30 tablet 5   gabapentin (NEURONTIN) 300 MG capsule Take 300 mg by  mouth 3 (three) times daily as needed (pain).     hydrALAZINE (APRESOLINE) 25 MG tablet Take 1 tablet (25 mg total) by mouth 2 (two) times daily. Can Take Additional 25 mg Tablet for Blood Pressure 170/80 90 tablet 2   hydrOXYzine (VISTARIL) 50 MG capsule Take 50 mg by mouth 2 (two) times daily as needed for itching or anxiety.     Insulin Glargine (BASAGLAR KWIKPEN) 100 UNIT/ML Inject 80 Units into the skin at bedtime. (Patient taking differently: Inject 160 Units into the skin at bedtime. Divided into 2 separate injections)     Insulin Pen Needle (PEN NEEDLES) 31G X 8 MM MISC 1 Device by Does not apply route daily. 90 each 3   Insulin Syringe-Needle U-100 26G X 1/2" 1 ML MISC Use daily for insulin injection as directed 100 each 3   Ipratropium-Albuterol (COMBIVENT RESPIMAT) 20-100 MCG/ACT AERS respimat Inhale 1 puff into the lungs every 6 (six) hours as needed for wheezing. 4 g 5   ipratropium-albuterol (DUONEB) 0.5-2.5 (3) MG/3ML SOLN Take 3 mLs by nebulization every 6 (six) hours as needed (Wheezing or dyspnea.). 360 mL 11   levothyroxine (SYNTHROID) 150 MCG tablet Take 1 tablet (150 mcg total) by mouth daily. 90 tablet 3   metFORMIN (GLUCOPHAGE) 500 MG tablet Take 1 tablet (500 mg total) by mouth 2 (two) times daily with a meal. (Patient taking differently: Take 500 mg by mouth daily with breakfast.) 180 tablet 3   metoprolol tartrate (LOPRESSOR) 25 MG tablet TAKE ONE TABLET BY MOUTH EVERY MORNING and TAKE ONE TABLET BY MOUTH EVERYDAY AT BEDTIME 30 tablet 1   morphine (MS CONTIN) 15 MG 12 hr tablet Take 15 mg by mouth daily. May take 1 additional tablet in the evening as needed for pain     naloxone (NARCAN) nasal spray 4 mg/0.1 mL One spray in either nostril once for known/suspected opioid overdose. May repeat every 2-3 minutes in alternating nostril til EMS arrives     OXYGEN Inhale 3 L into the lungs at bedtime.     PARoxetine (PAXIL) 40 MG tablet Take 1 tablet (40 mg total) by mouth daily. 90  tablet 1   potassium chloride SA (KLOR-CON M20) 20 MEQ tablet TAKE 2 TABLETS EVERY DAY AS NEEDED FOR CRAMPING (Patient taking differently: Take 40 mEq by mouth daily as needed (for cramping).) 180 tablet 1   predniSONE (DELTASONE) 5 MG tablet Take 20 mg by mouth daily.     promethazine (PHENERGAN) 25 MG tablet Take 0.5 tablets (12.5 mg total) by mouth every 8 (eight) hours as needed for nausea or vomiting. 30 tablet 1   Propylene Glycol (SYSTANE BALANCE) 0.6 % SOLN Place 1 drop into both eyes daily as needed (dry eyes).     Semaglutide, 1 MG/DOSE, (OZEMPIC, 1 MG/DOSE,) 4 MG/3ML SOPN Inject 1 mg into the skin once a week. (Patient taking differently: Inject 1 mg into the skin every Monday.) 9 mL 3   simvastatin (ZOCOR) 20 MG tablet Take 1 tablet (20 mg total) by mouth daily at 6 PM. 90 tablet 2   tiZANidine (ZANAFLEX) 4 MG tablet Take 4 mg by mouth every 8 (eight) hours  as needed for muscle spasms.     traZODone (DESYREL) 150 MG tablet Take 150 mg by mouth at bedtime as needed for sleep.     No facility-administered medications prior to visit.    Allergies  Allergen Reactions   Gabapentin Swelling    Swelling in legs    Losartan Other (See Comments)    Myalgias and muscle cramping    Aricept [Donepezil Hcl]     Nausea/vomiting, low BP   Hydroxyzine     hallucinations   Oxycodone Itching   Sulfa Antibiotics Nausea Only and Rash   Sulfonamide Derivatives Nausea Only and Rash    ROS     Objective:    Physical Exam  There were no vitals taken for this visit. Wt Readings from Last 3 Encounters:  03/27/22 293 lb (132.9 kg)  03/01/22 291 lb (132 kg)  02/05/22 291 lb (132 kg)   Alert and oriented and in no acute distress.  Respirations unlabored.  Normal speech, memory and mood.    Assessment & Plan:   Problem List Items Addressed This Visit       Other   Nausea   Other Visit Diagnoses     Diarrhea, unspecified type    -  Primary   Relevant Medications    diphenoxylate-atropine (LOMOTIL) 2.5-0.025 MG tablet   Other Relevant Orders   CBC with Differential/Platelet   Comprehensive metabolic panel   GI Profile, Stool, PCR   History of Clostridium difficile infection          Diarrhea x4 days without fever or blood.  Imodium is not helping and she is requesting Lomotil.  States she has taken this in the past.  She has a history of C. difficile and we discussed that I cannot rule out toxins without doing stool studies.  She is amenable to coming in tomorrow for labs and stool studies.  She needs to get to [facility] tomorrow for medical appointment and I will prescribe Lomotil as requested. She has promethazine at home for nausea  I am having Inez Catalina D. Madaline Guthrie "Diane" start on diphenoxylate-atropine. I am also having her maintain her gabapentin, acetaminophen, Insulin Syringe-Needle U-100, potassium chloride SA, PARoxetine, ipratropium-albuterol, naloxone, Pen Needles, metFORMIN, Ozempic (1 MG/DOSE), tiZANidine, OXYGEN, hydrOXYzine, albuterol, traZODone, Systane Balance, fexofenadine, Basaglar KwikPen, levothyroxine, promethazine, furosemide, aspirin, folic acid, ergocalciferol, busPIRone, predniSONE, Humira Pen, morphine, Combivent Respimat, simvastatin, hydrALAZINE, cephALEXin, metoprolol tartrate, and amLODipine.  Meds ordered this encounter  Medications   diphenoxylate-atropine (LOMOTIL) 2.5-0.025 MG tablet    Sig: Take 2 tablets by mouth 4 (four) times daily as needed for diarrhea or loose stools.    Dispense:  16 tablet    Refill:  0    Order Specific Question:   Supervising Provider    Answer:   Pricilla Holm A [[id number]]    I discussed the assessment and treatment plan with the patient. The patient was provided an opportunity to ask questions and all were answered. The patient agreed with the plan and demonstrated an understanding of the instructions.   The patient was advised to call back or seek an in-person evaluation if the  symptoms worsen or if the condition fails to improve as anticipated.  I provided 20 minutes of face-to-face time during this encounter.   Harland Dingwall, NP-C [facility] [place] [phone] (phone) [phone] (fax)  [place]

## 2022-05-03 ENCOUNTER — Telehealth: Payer: Self-pay

## 2022-05-03 DIAGNOSIS — M5137 Other intervertebral disc degeneration, lumbosacral region: Secondary | ICD-10-CM | POA: Diagnosis not present

## 2022-05-03 DIAGNOSIS — M25562 Pain in left knee: Secondary | ICD-10-CM | POA: Diagnosis not present

## 2022-05-03 DIAGNOSIS — G894 Chronic pain syndrome: Secondary | ICD-10-CM | POA: Diagnosis not present

## 2022-05-03 DIAGNOSIS — M25561 Pain in right knee: Secondary | ICD-10-CM | POA: Diagnosis not present

## 2022-05-03 LAB — CBC WITH DIFFERENTIAL/PLATELET
Basophils Absolute: 0.2 10*3/uL — ABNORMAL HIGH (ref 0.0–0.1)
Basophils Relative: 1.1 % (ref 0.0–3.0)
Eosinophils Absolute: 0.2 10*3/uL (ref 0.0–0.7)
Eosinophils Relative: 1.8 % (ref 0.0–5.0)
HCT: 41.2 % (ref 36.0–46.0)
Hemoglobin: 13 g/dL (ref 12.0–15.0)
Lymphocytes Relative: 29.9 % (ref 12.0–46.0)
Lymphs Abs: 3.9 10*3/uL (ref 0.7–4.0)
MCHC: 31.6 g/dL (ref 30.0–36.0)
MCV: 82 fl (ref 78.0–100.0)
Monocytes Absolute: 1 10*3/uL (ref 0.1–1.0)
Monocytes Relative: 7.5 % (ref 3.0–12.0)
Neutro Abs: 7.8 10*3/uL — ABNORMAL HIGH (ref 1.4–7.7)
Neutrophils Relative %: 59.7 % (ref 43.0–77.0)
Platelets: 196 10*3/uL (ref 150.0–400.0)
RBC: 5.02 Mil/uL (ref 3.87–5.11)
RDW: 15.6 % — ABNORMAL HIGH (ref 11.5–15.5)
WBC: 13.1 10*3/uL — ABNORMAL HIGH (ref 4.0–10.5)

## 2022-05-03 LAB — COMPREHENSIVE METABOLIC PANEL
ALT: 17 U/L (ref 0–35)
AST: 16 U/L (ref 0–37)
Albumin: 3.7 g/dL (ref 3.5–5.2)
Alkaline Phosphatase: 72 U/L (ref 39–117)
BUN: 20 mg/dL (ref 6–23)
CO2: 31 mEq/L (ref 19–32)
Calcium: 9.1 mg/dL (ref 8.4–10.5)
Chloride: 96 mEq/L (ref 96–112)
Creatinine, Ser: 1.29 mg/dL — ABNORMAL HIGH (ref 0.40–1.20)
GFR: 43.44 mL/min — ABNORMAL LOW (ref >60.00)
Glucose, Bld: 293 mg/dL — ABNORMAL HIGH (ref 70–99)
Potassium: 4.1 mEq/L (ref 3.5–5.1)
Sodium: 135 mEq/L (ref 135–145)
Total Bilirubin: 0.5 mg/dL (ref 0.2–1.2)
Total Protein: 6.8 g/dL (ref 6.0–8.3)

## 2022-05-03 NOTE — Telephone Encounter (Signed)
Pt called to schedule a follow up appt.

## 2022-05-06 ENCOUNTER — Telehealth: Payer: Self-pay | Admitting: Gastroenterology

## 2022-05-06 MED ORDER — PROMETHAZINE HCL 25 MG PO TABS: 12.5000 mg | ORAL_TABLET | Freq: Three times a day (TID) | ORAL | 0 refills | Status: DC | PRN

## 2022-05-06 NOTE — Telephone Encounter (Signed)
Limited refill given. Patient needs a follow up for continued refills.

## 2022-05-06 NOTE — Telephone Encounter (Signed)
Patient called requesting a refill on Phenergan to be sent to [facility] on [address]

## 2022-05-08 NOTE — Telephone Encounter (Signed)
Patient scheduled for 05/10/22 @ 11:20 with Dr. Cruzita Lederer.

## 2022-05-09 ENCOUNTER — Other Ambulatory Visit: Payer: Self-pay | Admitting: Internal Medicine

## 2022-05-09 DIAGNOSIS — E1159 Type 2 diabetes mellitus with other circulatory complications: Secondary | ICD-10-CM

## 2022-05-10 ENCOUNTER — Ambulatory Visit (INDEPENDENT_AMBULATORY_CARE_PROVIDER_SITE_OTHER): Payer: Medicare HMO | Admitting: Internal Medicine

## 2022-05-10 ENCOUNTER — Other Ambulatory Visit: Payer: Self-pay | Admitting: Internal Medicine

## 2022-05-10 ENCOUNTER — Encounter: Payer: Self-pay | Admitting: Internal Medicine

## 2022-05-10 VITALS — BP 140/74 | HR 73 | Ht 69.0 in | Wt 305.8 lb

## 2022-05-10 DIAGNOSIS — E782 Mixed hyperlipidemia: Secondary | ICD-10-CM

## 2022-05-10 DIAGNOSIS — E1159 Type 2 diabetes mellitus with other circulatory complications: Secondary | ICD-10-CM

## 2022-05-10 DIAGNOSIS — E1165 Type 2 diabetes mellitus with hyperglycemia: Secondary | ICD-10-CM | POA: Diagnosis not present

## 2022-05-10 DIAGNOSIS — E890 Postprocedural hypothyroidism: Secondary | ICD-10-CM

## 2022-05-10 DIAGNOSIS — Z85850 Personal history of malignant neoplasm of thyroid: Secondary | ICD-10-CM | POA: Diagnosis not present

## 2022-05-10 LAB — POCT GLYCOSYLATED HEMOGLOBIN (HGB A1C): Hemoglobin A1C: 11.9 % — AB (ref 4.0–5.6)

## 2022-05-10 LAB — T4, FREE: Free T4: 0.83 ng/dL (ref 0.60–1.60)

## 2022-05-10 LAB — TSH: TSH: 4.62 u[IU]/mL (ref 0.35–5.50)

## 2022-05-10 MED ORDER — LYUMJEV KWIKPEN 200 UNIT/ML ~~LOC~~ SOPN: 10.0000 [IU] | PEN_INJECTOR | Freq: Three times a day (TID) | SUBCUTANEOUS | 3 refills | Status: DC

## 2022-05-10 MED ORDER — INSULIN PEN NEEDLE 32G X 4 MM MISC: 3 refills | Status: DC

## 2022-05-10 NOTE — Patient Instructions (Addendum)
Please continue: - Metformin 500 mg 2x a day - Ozempic 1 mg weekly in a.m.   Split: - Basaglar 80 units 2x a day  Start: - Lyumjev 10-20 units before meals  STOP ANY SWEET DRINKS!!!! STOP MILK!!!  Please continue Levothyroxine 150 mcg daily.  Take the thyroid hormone every day, with water, at least 30 minutes before breakfast, separated by at least 4 hours from: - acid reflux medications - calcium - iron - multivitamins  Please stop at the lab.  Please return in 2-3 months with your sugar log.

## 2022-05-10 NOTE — Progress Notes (Signed)
Patient ID: Mary Acosta, female   DOB: 09/24/1955, 66 y.o.   MRN: [id number]   HPI: Mary Acosta is a 66 y.o.-year-old female, initially referred by her PCP, Dr. Quay Burow, returning for follow-up for DM2, dx in 2000, insulin-dependent since 2011, uncontrolled, with long-term complications (peripheral neuropathy, gastroparesis, cerebrovascular disease with history of TIA 2012, DKA 2015) and also history of thyroid cancer and postsurgical hypothyroidism.  She previously saw my colleague, Dr. Loanne Drilling, up to 07/2019.  Last visit with me 7 mo ago.  Interim hx: She has increased urination (had 2x UTIs since last OV) and occasional nausea but no chest pain or blurry vision. She lost 2 sons: 2020 and 09/2020.  She had problems controlling her blood sugar recently due to grieving.  She was seeing psychiatry and taking Paxil. She continues to see the pain clinic.  Opiate dose decreased as she was not getting much benefit from the morphine >> now on a new pain patch. She was recently dx'ed with sero-negative RA >> started MTX and Prednisone >> stopped MTX 2/2 liver dysfunction. Currently on 15 mg Prednisone, but hoping to decrease the dose and possibly even stop if the pain patch works. She mentions that she has an insatiable appetite on the steroid.  DM2: Reviewed HbA1c levels: Lab Results  Component Value Date   HGBA1C 8.1 (A) 10/09/2021   HGBA1C 8.6 (A) 06/04/2021   HGBA1C 6.9 (A) 01/15/2021   HGBA1C 7.6 (A) 10/13/2020   HGBA1C 8.1 (A) 07/05/2020   HGBA1C 10.4 (H) 05/03/2020   HGBA1C 11.0 (H) 10/22/2019   HGBA1C 9.4 (H) 07/20/2019   HGBA1C 8.8 (H) 04/01/2019   HGBA1C 10.5 (A) 10/08/2018   She was previously on: -  Basaglar 80 units at bedtime -[employer] >> stopped >> once a week >> 50 >> 80 >> 160 units daily - Metformin 1000 mg with dinner >> 500 mg 2x a day - Ozempic 0.5 >> 1 mg weekly - tolerates it well She called [place] after last visit with high blood sugars in the setting of  steroid treatment  >> added regular insulin 15-20 units 30 min before each meal  Prev. On Rybelsus 3 mg before breakfast-see patient assistance pgm  She was on N and R insulin in the past. She was on metformin in the past >> did not work.  Pt checks her sugars 1-3 times a day: - am:  125-225 >> 110-125 >> 170-190 >> 117-225 (forgets Basaglar) >> 539, 589 - 2h after b'fast: n/c - before lunch: n/c - 2h after lunch: n/c - before dinner: 150-170 >> 130-150 >> 220 >> 160-250 >> n/c - 2h after dinner: n/c - bedtime: n/c >> 200s >> 130s >> 250-260 >> <250 >> 300s - nighttime: n/c Lowest sugar was 75 >> 110 >> 170 >> 300s recently; she has hypoglycemia awareness in the 70s. Highest sugar was 600... >> 200 >> 600 >> 200s >> 500s.  Glucometer: AccuChek, Prodigy  Pt's meals are: - Breakfast: bacon or sausage and eggs, cereal  - stopped - Lunch: salad; hot dog - Dinner: meat + veggies - Snacks: not so much anymore Previously drinking Kool-Aid, now stopped.  However, she is still having that she is drinking High C juice.   She is status post gastric banding with vagotomy in 2008.  She lost 148 pounds afterwards but then gained more than 100 pounds back.  No h/o pancreatitis or FH of MTC.  No CKD, last BUN/creatinine:  Lab Results  Component Value Date   BUN 20 05/03/2022   BUN 26 (H) 02/05/2022   CREATININE 1.29 (H) 05/03/2022   CREATININE 1.12 (H) 02/05/2022  Not on ACE inhibitor/ARB.  + HL; last set of lipids: Lab Results  Component Value Date   CHOL 183 10/09/2021   HDL 52.90 10/09/2021   LDLCALC 104 (H) 10/09/2021   TRIG 127.0 10/09/2021   CHOLHDL 3 10/09/2021  On Zocor 20, Zetia 10.  - last eye exam was in 2023 reportedly: no DR. Stopped IO injections for macular degeneration.  -She has pain, numbness, tingling in her feet.  Last foot exam 09/2021.  On ASA 81.  Pt has FH of DM in mother.  Thyroid cancer:  Reviewed her thyroid cancer history: She is status post  total thyroidectomy - [place] Sx 2007. 11/22/2005: THYROID, THYROIDECTOMY:   - PAPILLARY THYROID CARCINOMA (0.5 CM).   - ADENOMATOID NODULES.    COMMENT   ONCOLOGY TABLE-THYROID    1. Maximum tumor size (cm): 0.5 cm   2. Tumor location: Right mid lobe   3. Multifocality: Not identified   4. Histology: Papillary thyroid carcinoma   5. Margins: Negative   6. Capsular invasion: Present   7. Extrathyroidal extension: Not identified   8. Vascular/Lymphatic invasion: Not identified   9. Lymph nodes: N/A   10. TNM code: pT1, pNX, pMX   11. Comments: Examination of the thyroidectomy specimen   grossly demonstrates multiple nodules. Histologic evaluation   demonstrates numerous adenomatoid nodules. One nodule measuring   0.5 cm demonstrates features compatible with papillary thyroid   carcinoma. The surgical margins are negative. (MS:jy) 11/25/05   Neck U/S (10/25/2020): no abnormal neck masses  Postsurgical hypothyroidism:  She was on 200 mcg 6/7 days and 100 mcg 1/7 days >> we decreased the dose to 150 mcg daily at last visit: - no missed doses anymore - in am - fasting - at least 30 min from b'fast - no calcium - no iron - no multivitamins - no PPIs - not on Biotin  She did not present for repeat labs after the above change...  Reviewed her TFTs: Lab Results  Component Value Date   TSH 0.13 (L) 10/09/2021   TSH 0.51 02/02/2021   TSH 0.14 (L) 08/04/2020   TSH 4.42 05/03/2020   TSH 1.50 10/22/2019   TSH 1.09 07/20/2019   TSH 1.32 04/01/2019   TSH 0.55 09/04/2018   TSH 0.90 12/26/2017   TSH 0.113 (L) 12/08/2017   TSH 0.22 (L) 09/19/2017   TSH 0.62 06/13/2017   TSH 1.43 01/22/2017   TSH 6.67 (H) 12/10/2016   TSH 1.12 01/29/2016   Pt denies: - feeling nodules in neck - hoarseness - dysphagia - choking  Not on steroids or biotin.  She also has a history of COPD, OSA, fibromyalgia, depression, fatty liver-cirrhosis, DDD, h/o vb fx, PMR.  She is seen in  [facility] clinic [facility].  She is on Neurontin and amitriptyline.  ROS: Musculoskeletal:  + muscle and joint aches Skin: no rashes, no hair loss Neurological: + tremors/no numbness/no tingling/no dizziness  I reviewed pt's medications, allergies, PMH, social hx, family hx, and changes were documented in the history of present illness. Otherwise, unchanged from my initial visit note.  Past Medical History:  Diagnosis Date   AKI (acute kidney injury) ([place]) 01/2017   Allergy    seasonal   Anxiety    with panic attacks   Arthritis    "back; feet; hands; shoulders" (08/26/2014)  Asthma    Cataract    forming   Cervical cancer (HCC)    Chronic lower back pain    Chronic narcotic use    Chronic pain syndrome    [facility]   Cirrhosis ([place])    Clostridium difficile infection 2017   COPD (chronic obstructive pulmonary disease) (HCC)    Daily headache    past hx   Depression    Diabetic neuropathy ([place]) 06/04/2017   feet  and legs , some in hands   DJD (degenerative joint disease)    Fatty liver disease, nonalcoholic    Fibromyalgia    Frequency of urination    HCAP (healthcare-associated pneumonia) 08/26/2014   History of TIA (transient ischemic attack) 11/01/2010   NO RESIDUAL   Hyperlipidemia    Hypertension    Hypothyroidism    IDDM (insulin dependent diabetes mellitus)    Insomnia    Lumbar stenosis    Macular degeneration    Memory difficulty 07/25/2016   Nocturia    OSA (obstructive sleep apnea)    NO CPAP SINCE WT LOSS   Osteoarthritis    with severe disease in knee   Oxygen deficiency    4  liters at bedtime only   Pneumonia "several times"   Polymyalgia rheumatica ([place])    Scoliosis    Seasonal allergies    Stroke [facility])    TIA   Thyroid cancer ([place])    Urgency of urination    Vaginal pain S/P SLING  FEB 2012   Past Surgical History:  Procedure Laterality Date   APPENDECTOMY  1982   BREAST EXCISIONAL BIOPSY Left 02/28/2005    Atypical Ductal Hyperplasia   CARDIAC CATHETERIZATION  09/04/2004   NORMAL CORONARY ANATOMY/ NORMAL LVF/ EF 60%   CARDIOVASCULAR STRESS TEST  12-27-2010  DR [place]   ABNORMAL NUCLEAR STUDY W/ /MILD INFERIOR ISCHEMIA/ EF 69%/  CT HEART ANGIOGRAM ;  NO ACUTE FINDINGS   COLONOSCOPY     CRYOABLATION  05/16/2003   w/LEEP FOR ABNORMAL PAP SMEAR   CYSTOSCOPY  05/18/2012   Procedure: CYSTOSCOPY;  Surgeon: Reece Packer, MD;  Location: [place];  Service: Urology;  Laterality: N/A;  examination under anethesia   ESOPHAGOGASTRODUODENOSCOPY (EGD) WITH PROPOFOL N/A 09/03/2016   Procedure: ESOPHAGOGASTRODUODENOSCOPY (EGD) WITH PROPOFOL;  Surgeon: Doran Stabler, MD;  Location: [facility];  Service: Gastroenterology;  Laterality: N/A;   ESOPHAGOGASTRODUODENOSCOPY (EGD) WITH PROPOFOL N/A 11/19/2021   Procedure: ESOPHAGOGASTRODUODENOSCOPY (EGD) WITH PROPOFOL;  Surgeon: Doran Stabler, MD;  Location: [facility];  Service: Gastroenterology;  Laterality: N/A;  Varices screeing, cirrhosis   HYSTEROSCOPY WITH D & C  08/19/2007   PMB   KNEE ARTHROSCOPY W/ DEBRIDEMENT Left 03/29/2006   INTERNAL DERANGEMENT/ SEVERE DJD/ MENISCUS TEARS   LAPAROSCOPIC CHOLECYSTECTOMY  06/10/2002   LAPAROSCOPIC GASTRIC BANDING  03/01/2006   TRUNCAL VAGOTOMY/ PLACEMENT OF VG BAND   REVISION TOTAL KNEE ARTHROPLASTY Left 08-29-2008; 05/2009   TONSILLECTOMY  1969   TOTAL KNEE ARTHROPLASTY Left 01/23/2007   SEVERE DJD   TOTAL THYROIDECTOMY  11/22/2005   BILATERAL THYROID NODULES-- PAPILLARY CARCINOMA (0.5CM)/ ADENOMATOID NODULES   TRANSTHORACIC ECHOCARDIOGRAM  12/27/2010   LVSF NORMAL / EF 01-09%/ GRADE I DIASTOLIC DYSFUNCTION/ MILD MITRAL REGURG. / MILDLY DILATED LEFT ATRIUM/ MILDY INCREASED SYSTOLIC PRESSURE OF PULMONARY ARTERIES   TRANSVAGINAL SUBURETERAL TAPE/ SLING  09/28/2010   MIXED URINARY INCONTINENCE   TUBAL LIGATION  1983   Social History   Socioeconomic History  Marital status:  Married    Spouse name: Not on file   Number of children: 2   Years of education: 79   Highest education level: Not on file  Occupational History   Occupation: disabled    Employer: UNEMPLOYED  Tobacco Use   Smoking status: Former    Packs/day: 2.50    Years: 39.00    Total pack years: 97.50    Types: Cigarettes    Quit date: 10/22/2010    Years since quitting: 11.5   Smokeless tobacco: Never  Vaping Use   Vaping Use: Never used  Substance and Sexual Activity   Alcohol use: No    Alcohol/week: 0.0 standard drinks of alcohol   Drug use: No   Sexual activity: Not Currently  Other Topics Concern   Not on file  Social History Narrative   Lives at home w/ her husband    Right-handed   Caffeine: 1 cup of coffee per week + [employer]   Social Determinants of Health   Financial Resource Strain: Medium Risk (10/05/2020)   Overall Financial Resource Strain (CARDIA)    Difficulty of Paying Living Expenses: Somewhat hard  Food Insecurity: No Food Insecurity (05/28/2021)   Hunger Vital Sign    Worried About Running Out of Food in the Last Year: Never true    [place] in the Last Year: Never true  Transportation Needs: No Transportation Needs (05/28/2021)   PRAPARE - Hydrologist (Medical): No    Lack of Transportation (Non-Medical): No  Physical Activity: Inactive (05/28/2021)   Exercise Vital Sign    Days of Exercise per Week: 0 days    Minutes of Exercise per Session: 0 min  Stress: Stress Concern Present (05/28/2021)   [place]    Feeling of Stress : To some extent  Social Connections: Moderately Integrated (05/28/2021)   Social Connection and Isolation Panel [NHANES]    Frequency of Communication with Friends and Family: More than three times a week    Frequency of Social Gatherings with Friends and Family: More than three times a week    Attends Religious Services: More than 4  times per year    Active Member of [employer] or Organizations: No    Attends Music therapist: More than 4 times per year    Marital Status: Widowed  Intimate Partner Violence: Not At Risk (05/28/2021)   Humiliation, Afraid, Rape, and Kick questionnaire    Fear of Current or Ex-Partner: No    Emotionally Abused: No    Physically Abused: No    Sexually Abused: No   Current Outpatient Medications on File Prior to Visit  Medication Sig Dispense Refill   acetaminophen (TYLENOL) 500 MG tablet Take 1,000 mg by mouth every 6 (six) hours as needed for moderate pain.     Adalimumab (HUMIRA PEN) 40 MG/0.4ML PNKT Inject 40 mg into the skin every 14 (fourteen) days. Every other Thursday     albuterol (VENTOLIN HFA) 108 (90 Base) MCG/ACT inhaler TAKE 2 PUFFS BY MOUTH EVERY 6 HOURS AS NEEDED FOR WHEEZE OR SHORTNESS OF BREATH (Patient taking differently: Inhale 2 puffs into the lungs every 6 (six) hours as needed for wheezing or shortness of breath.) 18 g 11   amLODipine (NORVASC) 5 MG tablet TAKE ONE TABLET BY MOUTH EVERY MORNING and TAKE ONE TABLET BY MOUTH EVERYDAY AT BEDTIME 60 tablet 0   aspirin 81 MG chewable tablet  Chew 81 mg by mouth daily.     busPIRone (BUSPAR) 15 MG tablet Take 15 mg by mouth daily as needed (for anxiety).     cephALEXin (KEFLEX) 500 MG capsule Take 1 capsule (500 mg total) by mouth 2 (two) times daily. 14 capsule 0   diphenoxylate-atropine (LOMOTIL) 2.5-0.025 MG tablet Take 2 tablets by mouth 4 (four) times daily as needed for diarrhea or loose stools. 16 tablet 0   ergocalciferol (VITAMIN D2) 1.25 MG (50000 UT) capsule Take 50,000 Units by mouth once a week. Monday     fexofenadine (ALLEGRA) 180 MG tablet Take 180 mg by mouth daily as needed for allergies or rhinitis.     folic acid (FOLVITE) 1 MG tablet Take 1 mg by mouth daily.     furosemide (LASIX) 40 MG tablet TAKE ONE TABLET BY MOUTH every morning (Patient taking differently: Take 40 mg by mouth daily as needed  for edema.) 30 tablet 5   gabapentin (NEURONTIN) 300 MG capsule Take 300 mg by mouth 3 (three) times daily as needed (pain).     hydrALAZINE (APRESOLINE) 25 MG tablet Take 1 tablet (25 mg total) by mouth 2 (two) times daily. Can Take Additional 25 mg Tablet for Blood Pressure 170/80 90 tablet 2   hydrOXYzine (VISTARIL) 50 MG capsule Take 50 mg by mouth 2 (two) times daily as needed for itching or anxiety.     Insulin Glargine (BASAGLAR KWIKPEN) 100 UNIT/ML Inject 80 Units into the skin at bedtime. (Patient taking differently: Inject 160 Units into the skin at bedtime. Divided into 2 separate injections)     Insulin Syringe-Needle U-100 26G X 1/2" 1 ML MISC Use daily for insulin injection as directed 100 each 3   Ipratropium-Albuterol (COMBIVENT RESPIMAT) 20-100 MCG/ACT AERS respimat Inhale 1 puff into the lungs every 6 (six) hours as needed for wheezing. 4 g 5   ipratropium-albuterol (DUONEB) 0.5-2.5 (3) MG/3ML SOLN Take 3 mLs by nebulization every 6 (six) hours as needed (Wheezing or dyspnea.). 360 mL 11   levothyroxine (SYNTHROID) 150 MCG tablet Take 1 tablet (150 mcg total) by mouth daily. 90 tablet 3   metFORMIN (GLUCOPHAGE) 500 MG tablet Take 1 tablet (500 mg total) by mouth 2 (two) times daily with a meal. (Patient taking differently: Take 500 mg by mouth daily with breakfast.) 180 tablet 3   metoprolol tartrate (LOPRESSOR) 25 MG tablet TAKE ONE TABLET BY MOUTH EVERY MORNING and TAKE ONE TABLET BY MOUTH EVERYDAY AT BEDTIME 30 tablet 1   morphine (MS CONTIN) 15 MG 12 hr tablet Take 15 mg by mouth daily. May take 1 additional tablet in the evening as needed for pain     naloxone (NARCAN) nasal spray 4 mg/0.1 mL One spray in either nostril once for known/suspected opioid overdose. May repeat every 2-3 minutes in alternating nostril til EMS arrives     OXYGEN Inhale 3 L into the lungs at bedtime.     PARoxetine (PAXIL) 40 MG tablet Take 1 tablet (40 mg total) by mouth daily. 90 tablet 1   potassium  chloride SA (KLOR-CON M20) 20 MEQ tablet TAKE 2 TABLETS EVERY DAY AS NEEDED FOR CRAMPING (Patient taking differently: Take 40 mEq by mouth daily as needed (for cramping).) 180 tablet 1   predniSONE (DELTASONE) 5 MG tablet Take 20 mg by mouth daily.     promethazine (PHENERGAN) 25 MG tablet Take 0.5 tablets (12.5 mg total) by mouth every 8 (eight) hours as needed for nausea or vomiting. [place]  tablet 0   Propylene Glycol (SYSTANE BALANCE) 0.6 % SOLN Place 1 drop into both eyes daily as needed (dry eyes).     Semaglutide, 1 MG/DOSE, (OZEMPIC, 1 MG/DOSE,) 4 MG/3ML SOPN Inject 1 mg into the skin once a week. (Patient taking differently: Inject 1 mg into the skin every Monday.) 9 mL 3   simvastatin (ZOCOR) 20 MG tablet Take 1 tablet (20 mg total) by mouth daily at 6 PM. 90 tablet 2   tiZANidine (ZANAFLEX) 4 MG tablet Take 4 mg by mouth every 8 (eight) hours as needed for muscle spasms.     traZODone (DESYREL) 150 MG tablet Take 150 mg by mouth at bedtime as needed for sleep.     TRUEPLUS PEN NEEDLES 31G X 8 MM MISC USE TO inject daily 100 each 3   No current facility-administered medications on file prior to visit.   Allergies  Allergen Reactions   Gabapentin Swelling    Swelling in legs    Losartan Other (See Comments)    Myalgias and muscle cramping    Aricept [Donepezil Hcl]     Nausea/vomiting, low BP   Hydroxyzine     hallucinations   Oxycodone Itching   Sulfa Antibiotics Nausea Only and Rash   Sulfonamide Derivatives Nausea Only and Rash   Family History  Problem Relation Age of Onset   Colon polyps Mother    Diabetes Mother    Heart disease Mother    Dementia Mother    Heart disease Father    High blood pressure Father    Colon cancer Maternal Uncle        x 3   Stomach cancer Paternal Uncle    Breast cancer Other        great aunts x 5   Esophageal cancer Neg Hx    Rectal cancer Neg Hx    PE: BP (!) 140/74 (BP Location: Right Arm, Patient Position: Sitting, Cuff Size:  Normal)   Pulse 73   Ht 5' 9"  (1.753 m)   Wt (!) 305 lb 12.8 oz (138.7 kg)   SpO2 93%   BMI 45.16 kg/m  Wt Readings from Last 3 Encounters:  05/10/22 (!) 305 lb 12.8 oz (138.7 kg)  03/27/22 293 lb (132.9 kg)  03/01/22 291 lb (132 kg)   Constitutional: overweight, in NAD Eyes:  EOMI, no exophthalmos ENT: no neck masses, no cervical lymphadenopathy Cardiovascular: RRR, No MRG Respiratory: CTA B Musculoskeletal: no deformities Skin:no rashes Neurological: no tremor with outstretched hands  ASSESSMENT: 1. DM2, insulin-dependent, uncontrolled, with complications  2.  History of thyroid cancer  3.  Postsurgical hypothyroidism  PLAN:  1. Patient with longstanding, uncontrolled, type 2 diabetes, on metformin, long-acting insulin, and GLP-1 receptor agonist, with much worsening blood sugars recently after being started on prednisone for rheumatoid arthritis.  She is currently trying a pain patch and hopefully will be able to decrease the prednisone dose afterwards.  Currently on 15 mg daily.  She mentions that her appetite has increased significantly since last visit and she also gained weight (per our scale, 34 pounds).  Upon questioning, she is still drinking sweet drinks: How I am punch and milk.  I strongly advised her to stop these right away! -Since last visit she doubled up on the dose of Basaglar but she injects this at 1 time.  I advised her to split the dose in 2 -For now we can continue the metformin and the Ozempic, but I also advised her to add  mealtime insulin.  We will start at 10 units of Lyumjev, which I advised her to inject at the start of the meal.  If the sugars remain high, we discussed about increasing the dose.  I advised her to get in touch with me if the sugars do not improve after the above changes. - I suggested to:  Patient Instructions  Please continue: - Metformin 500 mg 2x a day - Ozempic 1 mg weekly in a.m.   Split: - Basaglar 80 units 2x a  day  Start: - Lyumjev 10-20 units before meals  STOP ANY SWEET DRINKS!!!! STOP MILK!!!  Please continue Levothyroxine 150 mcg daily.  Take the thyroid hormone every day, with water, at least 30 minutes before breakfast, separated by at least 4 hours from: - acid reflux medications - calcium - iron - multivitamins  Please stop at the lab.  Please return in 2-3 months with your sugar log.     - we checked her HbA1c: 11.9% (much higher) - advised to check sugars at different times of the day - 4x a day, rotating check times - advised for yearly eye exams >> she is UTD - return to clinic in 3-4 months  2.  History of thyroid cancer -She had a papillary thyroid cancer focus in the right thyroid lobe, during 0.5 cm.  No vascular, lymphatic or direct extension into the surrounding tissues -No neck compression symptoms -Later neck ultrasound obtained in 10/2020 showed no suspicious masses -She is most likely cured  3.  Postsurgical hypothyroidism - latest thyroid labs reviewed with pt. >> TSH was suppressed: Lab Results  Component Value Date   TSH 0.13 (L) 10/09/2021  - at that time, I advised her to decrease the dose of levothyroxine to 150 mcg daily (previously getting approximately 181 mcg daily) - she did not return for repeat labs in 1.5 months, as advised... - she feels that the dose of her levothyroxine needs to be increased.  She did gain weight, however, she attributes this to the prednisone her increased appetite. - we discussed about taking the thyroid hormone every day, with water, >30 minutes before breakfast, separated by >4 hours from acid reflux medications, calcium, iron, multivitamins. Pt. is taking it correctly. - will check thyroid tests today: TSH and fT4.   - If labs are abnormal, she will need to return for repeat TFTs in 1.5 months  Component     Latest Ref Rng 05/10/2022  Hemoglobin A1C     4.0 - 5.6 % 11.9 !   TSH     0.35 - 5.50 uIU/mL 4.62    T4,Free(Direct)     0.60 - 1.60 ng/dL 0.83   TFTs normal >> we will continue the same dose of levothyroxine for now  Philemon Kingdom, MD PhD [facility]

## 2022-05-10 NOTE — Progress Notes (Signed)
hyp

## 2022-05-11 ENCOUNTER — Encounter: Payer: Self-pay | Admitting: Family Medicine

## 2022-05-12 DIAGNOSIS — J449 Chronic obstructive pulmonary disease, unspecified: Secondary | ICD-10-CM | POA: Diagnosis not present

## 2022-05-13 ENCOUNTER — Other Ambulatory Visit: Payer: Self-pay | Admitting: Adult Health

## 2022-05-16 ENCOUNTER — Other Ambulatory Visit (HOSPITAL_COMMUNITY): Payer: Self-pay

## 2022-05-16 ENCOUNTER — Telehealth: Payer: Self-pay

## 2022-05-16 DIAGNOSIS — R197 Diarrhea, unspecified: Secondary | ICD-10-CM | POA: Diagnosis not present

## 2022-05-16 NOTE — Telephone Encounter (Signed)
PA request received from [facility] for Lyumjev KwikPen 200UNIT/ML pen-injectors.  PA submitted in Covermymeds.   PA approved from 05-16-2022 through 08-25-2022  Key: [id number] PA Case ID: [id number]

## 2022-05-18 LAB — GI PROFILE, STOOL, PCR

## 2022-05-22 ENCOUNTER — Other Ambulatory Visit: Payer: Self-pay | Admitting: Internal Medicine

## 2022-05-23 DIAGNOSIS — M5137 Other intervertebral disc degeneration, lumbosacral region: Secondary | ICD-10-CM | POA: Diagnosis not present

## 2022-05-23 DIAGNOSIS — G894 Chronic pain syndrome: Secondary | ICD-10-CM | POA: Diagnosis not present

## 2022-05-29 ENCOUNTER — Ambulatory Visit: Payer: Medicare HMO

## 2022-06-06 ENCOUNTER — Ambulatory Visit (INDEPENDENT_AMBULATORY_CARE_PROVIDER_SITE_OTHER): Payer: Medicare HMO

## 2022-06-06 DIAGNOSIS — Z0000 Encounter for general adult medical examination without abnormal findings: Secondary | ICD-10-CM

## 2022-06-06 NOTE — Patient Instructions (Signed)
Mary Acosta , Thank you for taking time to come for your Medicare Wellness Visit. I appreciate your ongoing commitment to your health goals. Please review the following plan we discussed and let me know if I can assist you in the future.   These are the goals we discussed:  Goals      CCM:  Monitor and Maintain HbA1c <8%     Continue to watch my sugar intake and get better control of my diabetes.  I would also like to lose weight.  Current HgA1C is 11.9 as of 05/10/2022.        This is a list of the screening recommended for you and due dates:  Health Maintenance  Topic Date Due   Colon Cancer Screening  11/25/2017   Pap Smear  05/27/2019   COVID-19 Vaccine (3 - Moderna risk series) 11/30/2019   Yearly kidney health urinalysis for diabetes  02/14/2021   Flu Shot  06/26/2022*   Zoster (Shingles) Vaccine (1 of 2) 08/01/2022*   DEXA scan (bone density measurement)  01/15/2023*   Tetanus Vaccine  01/15/2023*   Eye exam for diabetics  09/13/2022   Complete foot exam   10/09/2022   Hemoglobin A1C  11/08/2022   Mammogram  03/25/2023   Yearly kidney function blood test for diabetes  05/04/2023   Pneumonia Vaccine  Completed   Hepatitis C Screening: USPSTF Recommendation to screen - Ages 62-79 yo.  Completed   HIV Screening  Completed   HPV Vaccine  Aged Out  *Topic was postponed. The date shown is not the original due date.    Advanced directives: no  Conditions/risks identified: yes; get better control of diabetes  Next appointment: Follow up in one year for your annual wellness visit.   Preventive Care 35 Years and Older, Female Preventive care refers to lifestyle choices and visits with your health care provider that can promote health and wellness. What does preventive care include? A yearly physical exam. This is also called an annual well check. Dental exams once or twice a year. Routine eye exams. Ask your health care provider how often you should have your eyes  checked. Personal lifestyle choices, including: Daily care of your teeth and gums. Regular physical activity. Eating a healthy diet. Avoiding tobacco and drug use. Limiting alcohol use. Practicing safe sex. Taking low-dose aspirin every day. Taking vitamin and mineral supplements as recommended by your health care provider. What happens during an annual well check? The services and screenings done by your health care provider during your annual well check will depend on your age, overall health, lifestyle risk factors, and family history of disease. Counseling  Your health care provider may ask you questions about your: Alcohol use. Tobacco use. Drug use. Emotional well-being. Home and relationship well-being. Sexual activity. Eating habits. History of falls. Memory and ability to understand (cognition). Work and work Statistician. Reproductive health. Screening  You may have the following tests or measurements: Height, weight, and BMI. Blood pressure. Lipid and cholesterol levels. These may be checked every 5 years, or more frequently if you are over 31 years old. Skin check. Lung cancer screening. You may have this screening every year starting at age 93 if you have a 30-pack-year history of smoking and currently smoke or have quit within the past 15 years. Fecal occult blood test (FOBT) of the stool. You may have this test every year starting at age 12. Flexible sigmoidoscopy or colonoscopy. You may have a sigmoidoscopy every 5 years  or a colonoscopy every 10 years starting at age 51. Hepatitis C blood test. Hepatitis B blood test. Sexually transmitted disease (STD) testing. Diabetes screening. This is done by checking your blood sugar (glucose) after you have not eaten for a while (fasting). You may have this done every 1-3 years. Bone density scan. This is done to screen for osteoporosis. You may have this done starting at age 93. Mammogram. This may be done every 1-2  years. Talk to your health care provider about how often you should have regular mammograms. Talk with your health care provider about your test results, treatment options, and if necessary, the need for more tests. Vaccines  Your health care provider may recommend certain vaccines, such as: Influenza vaccine. This is recommended every year. Tetanus, diphtheria, and acellular pertussis (Tdap, Td) vaccine. You may need a Td booster every 10 years. Zoster vaccine. You may need this after age 62. Pneumococcal 13-valent conjugate (PCV13) vaccine. One dose is recommended after age 54. Pneumococcal polysaccharide (PPSV23) vaccine. One dose is recommended after age 7. Talk to your health care provider about which screenings and vaccines you need and how often you need them. This information is not intended to replace advice given to you by your health care provider. Make sure you discuss any questions you have with your health care provider. Document Released: 09/08/2015 Document Revised: 05/01/2016 Document Reviewed: 06/13/2015 Elsevier Interactive Patient Education  2017 [place] Prevention in the Home Falls can cause injuries. They can happen to people of all ages. There are many things you can do to make your home safe and to help prevent falls. What can I do on the outside of my home? Regularly fix the edges of walkways and driveways and fix any cracks. Remove anything that might make you trip as you walk through a door, such as a raised step or threshold. Trim any bushes or trees on the path to your home. Use bright outdoor lighting. Clear any walking paths of anything that might make someone trip, such as rocks or tools. Regularly check to see if handrails are loose or broken. Make sure that both sides of any steps have handrails. Any raised decks and porches should have guardrails on the edges. Have any leaves, snow, or ice cleared regularly. Use sand or salt on walking paths  during winter. Clean up any spills in your garage right away. This includes oil or grease spills. What can I do in the bathroom? Use night lights. Install grab bars by the toilet and in the tub and shower. Do not use towel bars as grab bars. Use non-skid mats or decals in the tub or shower. If you need to sit down in the shower, use a plastic, non-slip stool. Keep the floor dry. Clean up any water that spills on the floor as soon as it happens. Remove soap buildup in the tub or shower regularly. Attach bath mats securely with double-sided non-slip rug tape. Do not have throw rugs and other things on the floor that can make you trip. What can I do in the bedroom? Use night lights. Make sure that you have a light by your bed that is easy to reach. Do not use any sheets or blankets that are too big for your bed. They should not hang down onto the floor. Have a firm chair that has side arms. You can use this for support while you get dressed. Do not have throw rugs and other things on the floor  that can make you trip. What can I do in the kitchen? Clean up any spills right away. Avoid walking on wet floors. Keep items that you use a lot in easy-to-reach places. If you need to reach something above you, use a strong step stool that has a grab bar. Keep electrical cords out of the way. Do not use floor polish or wax that makes floors slippery. If you must use wax, use non-skid floor wax. Do not have throw rugs and other things on the floor that can make you trip. What can I do with my stairs? Do not leave any items on the stairs. Make sure that there are handrails on both sides of the stairs and use them. Fix handrails that are broken or loose. Make sure that handrails are as long as the stairways. Check any carpeting to make sure that it is firmly attached to the stairs. Fix any carpet that is loose or worn. Avoid having throw rugs at the top or bottom of the stairs. If you do have throw  rugs, attach them to the floor with carpet tape. Make sure that you have a light switch at the top of the stairs and the bottom of the stairs. If you do not have them, ask someone to add them for you. What else can I do to help prevent falls? Wear shoes that: Do not have high heels. Have rubber bottoms. Are comfortable and fit you well. Are closed at the toe. Do not wear sandals. If you use a stepladder: Make sure that it is fully opened. Do not climb a closed stepladder. Make sure that both sides of the stepladder are locked into place. Ask someone to hold it for you, if possible. Clearly mark and make sure that you can see: Any grab bars or handrails. First and last steps. Where the edge of each step is. Use tools that help you move around (mobility aids) if they are needed. These include: Canes. Walkers. Scooters. Crutches. Turn on the lights when you go into a dark area. Replace any light bulbs as soon as they burn out. Set up your furniture so you have a clear path. Avoid moving your furniture around. If any of your floors are uneven, fix them. If there are any pets around you, be aware of where they are. Review your medicines with your doctor. Some medicines can make you feel dizzy. This can increase your chance of falling. Ask your doctor what other things that you can do to help prevent falls. This information is not intended to replace advice given to you by your health care provider. Make sure you discuss any questions you have with your health care provider. Document Released: 06/08/2009 Document Revised: 01/18/2016 Document Reviewed: 09/16/2014 Elsevier Interactive Patient Education  2017 [employer].

## 2022-06-06 NOTE — Progress Notes (Signed)
Virtual Visit via Telephone Note  I connected with  Mary Acosta on 06/06/22 at  1:00 PM EDT by telephone and verified that I am speaking with the correct person using two identifiers.  Location: Patient: HOME Provider: [facility] Persons participating in the virtual visit: patient/Nurse Health Advisor   I discussed the limitations, risks, security and privacy concerns of performing an evaluation and management service by telephone and the availability of in person appointments. The patient expressed understanding and agreed to proceed.  Interactive audio and video telecommunications were attempted between this nurse and patient, however failed, due to patient having technical difficulties OR patient did not have access to video capability.  We continued and completed visit with audio only.  Some vital signs may be absent or patient reported.   Sheral Flow, LPN  Subjective:   Mary Acosta is a 66 y.o. female who presents for Medicare Annual (Subsequent) preventive examination.  Review of Systems     Cardiac Risk Factors include: advanced age (>5mn, >>66women);diabetes mellitus;dyslipidemia;family history of premature cardiovascular disease;hypertension;obesity (BMI >30kg/m2);sedentary lifestyle     Objective:    There were no vitals filed for this visit. There is no height or weight on file to calculate BMI.     06/06/2022    1:19 PM 02/05/2022    5:52 PM 11/19/2021    8:47 AM 05/28/2021    1:13 PM 01/28/2020    3:45 PM 07/06/2019   11:57 AM 05/14/2018   11:03 AM  Advanced Directives  Does Patient Have a Medical Advance Directive? No No No No No No No  Type of Advance Directive    Living will;Healthcare Power of Attorney     Does patient want to make changes to medical advance directive?    No - Patient declined     Copy of [place] Chart?    No - copy requested     Would patient like information on creating a medical  advance directive? No - Patient declined No - Patient declined Yes (MAU/Ambulatory/Procedural Areas - Information given) No - Patient declined No - Patient declined No - Patient declined No - Patient declined    Current Medications (verified) Outpatient Encounter Medications as of 06/06/2022  Medication Sig   acetaminophen (TYLENOL) 500 MG tablet Take 1,000 mg by mouth every 6 (six) hours as needed for moderate pain.   Adalimumab (HUMIRA PEN) 40 MG/0.4ML PNKT Inject 40 mg into the skin every 14 (fourteen) days. Every other Thursday   albuterol (VENTOLIN HFA) 108 (90 Base) MCG/ACT inhaler TAKE 2 PUFFS BY MOUTH EVERY 6 HOURS AS NEEDED FOR WHEEZE OR SHORTNESS OF BREATH (Patient taking differently: Inhale 2 puffs into the lungs every 6 (six) hours as needed for wheezing or shortness of breath.)   amLODipine (NORVASC) 5 MG tablet TAKE ONE TABLET BY MOUTH EVERY MORNING and TAKE ONE TABLET EVERYDAY AT BEDTIME   aspirin 81 MG chewable tablet Chew 81 mg by mouth daily.   buprenorphine (BUTRANS) 10 MCG/HR PTWK 1 patch once a week.   busPIRone (BUSPAR) 15 MG tablet Take 15 mg by mouth daily as needed (for anxiety).   cephALEXin (KEFLEX) 500 MG capsule Take 1 capsule (500 mg total) by mouth 2 (two) times daily.   diphenoxylate-atropine (LOMOTIL) 2.5-0.025 MG tablet Take 2 tablets by mouth 4 (four) times daily as needed for diarrhea or loose stools.   ergocalciferol (VITAMIN D2) 1.25 MG (50000 UT) capsule Take 50,000 Units by mouth  once a week. Monday   fexofenadine (ALLEGRA) 180 MG tablet Take 180 mg by mouth daily as needed for allergies or rhinitis.   folic acid (FOLVITE) 1 MG tablet Take 1 mg by mouth daily.   furosemide (LASIX) 40 MG tablet TAKE ONE TABLET BY MOUTH every morning (Patient taking differently: Take 40 mg by mouth daily as needed for edema.)   gabapentin (NEURONTIN) 300 MG capsule Take 300 mg by mouth 3 (three) times daily as needed (pain).   hydrALAZINE (APRESOLINE) 25 MG tablet Take 1  tablet (25 mg total) by mouth 2 (two) times daily. Can Take Additional 25 mg Tablet for Blood Pressure 170/80   hydrOXYzine (VISTARIL) 50 MG capsule Take 50 mg by mouth 2 (two) times daily as needed for itching or anxiety.   Insulin Glargine (BASAGLAR KWIKPEN) 100 UNIT/ML Inject 80 Units into the skin at bedtime. (Patient taking differently: Inject 160 Units into the skin at bedtime. Divided into 2 separate injections)   Insulin Lispro-aabc (LYUMJEV KWIKPEN) 200 UNIT/ML KwikPen Inject 10-20 Units into the skin 3 (three) times daily before meals.   Insulin Pen Needle 32G X 4 MM MISC Use 5x a day   Insulin Syringe-Needle U-100 26G X 1/2" 1 ML MISC Use daily for insulin injection as directed   Ipratropium-Albuterol (COMBIVENT RESPIMAT) 20-100 MCG/ACT AERS respimat Inhale 1 puff into the lungs every 6 (six) hours as needed for wheezing.   ipratropium-albuterol (DUONEB) 0.5-2.5 (3) MG/3ML SOLN Take 3 mLs by nebulization every 6 (six) hours as needed (Wheezing or dyspnea.).   levothyroxine (SYNTHROID) 150 MCG tablet Take 1 tablet (150 mcg total) by mouth daily.   metFORMIN (GLUCOPHAGE) 500 MG tablet Take 1 tablet (500 mg total) by mouth 2 (two) times daily with a meal. (Patient taking differently: Take 500 mg by mouth daily with breakfast.)   metoprolol tartrate (LOPRESSOR) 25 MG tablet TAKE ONE TABLET BY MOUTH EVERY MORNING and TAKE ONE TABLET BY MOUTH EVERYDAY AT BEDTIME   morphine (MS CONTIN) 15 MG 12 hr tablet Take 15 mg by mouth daily. May take 1 additional tablet in the evening as needed for pain (Patient not taking: Reported on 06/06/2022)   naloxone [facility]) nasal spray 4 mg/0.1 mL One spray in either nostril once for known/suspected opioid overdose. May repeat every 2-3 minutes in alternating nostril til EMS arrives   OXYGEN Inhale 3 L into the lungs at bedtime.   PARoxetine (PAXIL) 40 MG tablet Take 1 tablet (40 mg total) by mouth daily.   potassium chloride SA (KLOR-CON M20) 20 MEQ tablet TAKE 2  TABLETS EVERY DAY AS NEEDED FOR CRAMPING (Patient taking differently: Take 40 mEq by mouth daily as needed (for cramping).)   predniSONE (DELTASONE) 5 MG tablet Take 20 mg by mouth daily.   promethazine (PHENERGAN) 25 MG tablet Take 0.5 tablets (12.5 mg total) by mouth every 8 (eight) hours as needed for nausea or vomiting.   Propylene Glycol (SYSTANE BALANCE) 0.6 % SOLN Place 1 drop into both eyes daily as needed (dry eyes).   Semaglutide, 1 MG/DOSE, (OZEMPIC, 1 MG/DOSE,) 4 MG/3ML SOPN Inject 1 mg into the skin once a week. (Patient taking differently: Inject 1 mg into the skin every Monday.)   simvastatin (ZOCOR) 20 MG tablet Take 1 tablet (20 mg total) by mouth daily at 6 PM.   tiZANidine (ZANAFLEX) 4 MG tablet Take 4 mg by mouth every 8 (eight) hours as needed for muscle spasms.   traZODone (DESYREL) 150 MG tablet Take 150  mg by mouth at bedtime as needed for sleep.   No facility-administered encounter medications on file as of 06/06/2022.    Allergies (verified) Gabapentin, Losartan, Aricept [donepezil hcl], Hydroxyzine, Oxycodone, Sulfa antibiotics, and Sulfonamide derivatives   History: Past Medical History:  Diagnosis Date   AKI (acute kidney injury) ([place]) 01/2017   Allergy    seasonal   Anxiety    with panic attacks   Arthritis    "back; feet; hands; shoulders" (08/26/2014)   Asthma    Cataract    forming   Cervical cancer ([place])    Chronic lower back pain    Chronic narcotic use    Chronic pain syndrome    [facility]   Cirrhosis ([place])    Clostridium difficile infection 2017   COPD (chronic obstructive pulmonary disease) (HCC)    Daily headache    past hx   Depression    Diabetic neuropathy ([place]) 06/04/2017   feet  and legs , some in hands   DJD (degenerative joint disease)    Fatty liver disease, nonalcoholic    Fibromyalgia    Frequency of urination    HCAP (healthcare-associated pneumonia) 08/26/2014   History of TIA (transient ischemic attack)  11/01/2010   NO RESIDUAL   Hyperlipidemia    Hypertension    Hypothyroidism    IDDM (insulin dependent diabetes mellitus)    Insomnia    Lumbar stenosis    Macular degeneration    Memory difficulty 07/25/2016   Nocturia    OSA (obstructive sleep apnea)    NO CPAP SINCE WT LOSS   Osteoarthritis    with severe disease in knee   Oxygen deficiency    4  liters at bedtime only   Pneumonia "several times"   Polymyalgia rheumatica ([place])    Scoliosis    Seasonal allergies    Stroke [facility])    TIA   Thyroid cancer ([place])    Urgency of urination    Vaginal pain S/P SLING  FEB 2012   Past Surgical History:  Procedure Laterality Date   APPENDECTOMY  1982   BREAST EXCISIONAL BIOPSY Left 02/28/2005   Atypical Ductal Hyperplasia   CARDIAC CATHETERIZATION  09/04/2004   NORMAL CORONARY ANATOMY/ NORMAL LVF/ EF 60%   CARDIOVASCULAR STRESS TEST  12-27-2010  DR [place]   ABNORMAL NUCLEAR STUDY W/ /MILD INFERIOR ISCHEMIA/ EF 69%/  CT HEART ANGIOGRAM ;  NO ACUTE FINDINGS   COLONOSCOPY     CRYOABLATION  05/16/2003   w/LEEP FOR ABNORMAL PAP SMEAR   CYSTOSCOPY  05/18/2012   Procedure: CYSTOSCOPY;  Surgeon: Reece Packer, MD;  Location: [facility];  Service: Urology;  Laterality: N/A;  examination under anethesia   ESOPHAGOGASTRODUODENOSCOPY (EGD) WITH PROPOFOL N/A 09/03/2016   Procedure: ESOPHAGOGASTRODUODENOSCOPY (EGD) WITH PROPOFOL;  Surgeon: Doran Stabler, MD;  Location: [facility];  Service: Gastroenterology;  Laterality: N/A;   ESOPHAGOGASTRODUODENOSCOPY (EGD) WITH PROPOFOL N/A 11/19/2021   Procedure: ESOPHAGOGASTRODUODENOSCOPY (EGD) WITH PROPOFOL;  Surgeon: Doran Stabler, MD;  Location: [facility];  Service: Gastroenterology;  Laterality: N/A;  Varices screeing, cirrhosis   HYSTEROSCOPY WITH D & C  08/19/2007   PMB   KNEE ARTHROSCOPY W/ DEBRIDEMENT Left 03/29/2006   INTERNAL DERANGEMENT/ SEVERE DJD/ MENISCUS TEARS   LAPAROSCOPIC CHOLECYSTECTOMY  06/10/2002    LAPAROSCOPIC GASTRIC BANDING  03/01/2006   TRUNCAL VAGOTOMY/ PLACEMENT OF VG BAND   REVISION TOTAL KNEE ARTHROPLASTY Left 08-29-2008; 05/2009   TONSILLECTOMY  1969  TOTAL KNEE ARTHROPLASTY Left 01/23/2007   SEVERE DJD   TOTAL THYROIDECTOMY  11/22/2005   BILATERAL THYROID NODULES-- PAPILLARY CARCINOMA (0.5CM)/ ADENOMATOID NODULES   TRANSTHORACIC ECHOCARDIOGRAM  12/27/2010   LVSF NORMAL / EF 99-77%/ GRADE I DIASTOLIC DYSFUNCTION/ MILD MITRAL REGURG. / MILDLY DILATED LEFT ATRIUM/ MILDY INCREASED SYSTOLIC PRESSURE OF PULMONARY ARTERIES   TRANSVAGINAL SUBURETERAL TAPE/ SLING  09/28/2010   MIXED URINARY INCONTINENCE   TUBAL LIGATION  1983   Family History  Problem Relation Age of Onset   Colon polyps Mother    Diabetes Mother    Heart disease Mother    Dementia Mother    Heart disease Father    High blood pressure Father    Colon cancer Maternal Uncle        x 3   Stomach cancer Paternal Uncle    Breast cancer Other        great aunts x 5   Esophageal cancer Neg Hx    Rectal cancer Neg Hx    Social History   Socioeconomic History   Marital status: Married    Spouse name: Not on file   Number of children: 2   Years of education: 12   Highest education level: Not on file  Occupational History   Occupation: disabled    Fish farm manager: UNEMPLOYED  Tobacco Use   Smoking status: Former    Packs/day: 2.50    Years: 39.00    Total pack years: 97.50    Types: Cigarettes    Quit date: 10/22/2010    Years since quitting: 11.6   Smokeless tobacco: Never  Vaping Use   Vaping Use: Never used  Substance and Sexual Activity   Alcohol use: No    Alcohol/week: 0.0 standard drinks of alcohol   Drug use: No   Sexual activity: Not Currently  Other Topics Concern   Not on file  Social History Narrative   Lives at home w/ her husband    Right-handed   Caffeine: 1 cup of coffee per week + [employer]   Social Determinants of Health   Financial Resource Strain: Medium Risk (06/06/2022)    Overall Financial Resource Strain (CARDIA)    Difficulty of Paying Living Expenses: Somewhat hard  Food Insecurity: No Food Insecurity (06/06/2022)   Hunger Vital Sign    Worried About Running Out of Food in the Last Year: Never true    Ran Out of Food in the Last Year: Never true  Transportation Needs: No Transportation Needs (06/06/2022)   PRAPARE - Hydrologist (Medical): No    Lack of Transportation (Non-Medical): No  Physical Activity: Inactive (06/06/2022)   Exercise Vital Sign    Days of Exercise per Week: 0 days    Minutes of Exercise per Session: 0 min  Stress: No Stress Concern Present (06/06/2022)   [place]    Feeling of Stress : Only a little  Social Connections: Socially Integrated (06/06/2022)   Social Connection and Isolation Panel [NHANES]    Frequency of Communication with Friends and Family: More than three times a week    Frequency of Social Gatherings with Friends and Family: More than three times a week    Attends Religious Services: More than 4 times per year    Active Member of [employer] or Organizations: Yes    Attends Music therapist: More than 4 times per year    Marital Status: Married    Tobacco  Counseling Counseling given: Not Answered   Clinical Intake:  Pre-visit preparation completed: Yes  Pain : No/denies pain     Nutritional Risks: None Diabetes: No  How often do you need to have someone help you when you read instructions, pamphlets, or other written materials from your doctor or pharmacy?: 1 - Never What is the last grade level you completed in school?: HSG  Nutrition Risk Assessment:  Has the patient had any N/V/D within the last 2 months?  Yes  Does the patient have any non-healing wounds?  No  Has the patient had any unintentional weight loss or weight gain?  Yes   Diabetes:  Is the patient diabetic?  Yes  If diabetic,  was a CBG obtained today?  No  Did the patient bring in their glucometer from home?  No 370 FASTING THIS MORNING How often do you monitor your CBG's? 1-3 TIMES DAILY.   Financial Strains and Diabetes Management:  Are you having any financial strains with the device, your supplies or your medication? No .  Does the patient want to be seen by Chronic Care Management for management of their diabetes?  No  Would the patient like to be referred to a Nutritionist or for Diabetic Management?  Yes   Diabetic Exams:  Diabetic Eye Exam: Completed 09/13/2021 Diabetic Foot Exam: Completed 10/09/2021   Interpreter Needed?: No  Information entered by :: Lisette Abu, LPN.   Activities of Daily Living    06/06/2022    1:24 PM  In your present state of health, do you have any difficulty performing the following activities:  Hearing? 0  Vision? 0  Difficulty concentrating or making decisions? 1  Walking or climbing stairs? 1  Dressing or bathing? 1  Doing errands, shopping? 1  Preparing Food and eating ? N  Using the Toilet? N  In the past six months, have you accidently leaked urine? N  Do you have problems with loss of bowel control? N  Managing your Medications? Y  Managing your Finances? Y  Housekeeping or managing your Housekeeping? Y    Patient Care Team: Binnie Rail, MD as PCP - General (Internal Medicine) Stanford Breed Denice Bors, MD as PCP - Cardiology (Cardiology) Desma Maxim, MD as Referring Physician (Ophthalmology) Opthamology, [facility] as Consulting Physician (Ophthalmology) Sz[place], Darnelle Maffucci, [facility] (Inactive) as Pharmacist (Pharmacist)  Indicate any recent Medical Services you may have received from other than [facility] providers in the past year (date may be approximate).     Assessment:   This is a routine wellness examination for Mary Acosta.  Hearing/Vision screen Hearing Screening - Comments:: Denies hearing difficulties   Vision Screening - Comments:: Wears rx  glasses - up to date with routine eye exams with [facility]   Dietary issues and exercise activities discussed: Current Exercise Habits: The patient does not participate in regular exercise at present, Exercise limited by: cardiac condition(s);respiratory conditions(s);Other - see comments;psychological condition(s);neurologic condition(s) (rheumatoid condition)   Goals Addressed             This Visit's Progress    CCM:  Monitor and Maintain HbA1c <8%       Continue to watch my sugar intake and get better control of my diabetes.  I would also like to lose weight.  Current HgA1C is 11.9 as of 05/10/2022.      Depression Screen    06/06/2022    1:19 PM 05/28/2021    1:25 PM 01/28/2020    3:46 PM 09/04/2018  9:24 AM 06/13/2017   11:22 AM 06/04/2016    4:20 PM 03/06/2016    2:28 PM  PHQ 2/9 Scores  PHQ - 2 Score 6 6 1  0 0 0 0  PHQ- 9 Score 19 19         Fall Risk    06/06/2022    1:08 PM 05/28/2021    1:11 PM 01/28/2020    3:45 PM 06/13/2017   11:22 AM 06/04/2016    4:20 PM  Fall Risk   Falls in the past year? 1 1 1  No No  Number falls in past yr: 1 0 1    Injury with Fall? 1 0 0    Risk for fall due to : History of fall(s);Impaired balance/gait;Orthopedic patient No Fall Risks Impaired balance/gait;Impaired mobility;Orthopedic patient    Follow up Education provided;Falls prevention discussed  Falls evaluation completed;Education provided;Falls prevention discussed      FALL RISK PREVENTION PERTAINING TO THE HOME:  Any stairs in or around the home? No  If so, are there any without handrails? No  Home free of loose throw rugs in walkways, pet beds, electrical cords, etc? Yes  Adequate lighting in your home to reduce risk of falls? Yes   ASSISTIVE DEVICES UTILIZED TO PREVENT FALLS:  Life alert? No  Use of a cane, walker or w/c? Yes  Grab bars in the bathroom? No  Shower chair or bench in shower? Yes  Elevated toilet seat or a handicapped toilet? Yes    TIMED UP AND GO:  Was the test performed? No . PHONE VISIT   Cognitive Function:    08/29/2020    8:40 AM 12/03/2017   11:20 AM 06/04/2017   12:21 PM 12/27/2016    9:04 AM 07/25/2016    4:20 PM  MMSE - Mini Mental State Exam  Orientation to time 4 5 5 4 5   Orientation to Place 5 4 5 5 5   Registration 1 3 3 3 3   Attention/ Calculation 1 3 1 5 5   Recall 1 2 1 1 1   Language- name 2 objects 2 2 2 2 2   Language- repeat 1 1 1 1 1   Language- follow 3 step command 3 3 2 3 2   Language- read & follow direction 1 1 1 1 1   Write a sentence 1 1 1 1 1   Copy design 0 1 0 0 1  Total score 20 26 22 26 27         06/06/2022    1:26 PM  6CIT Screen  What Year? 0 points  What month? 0 points  What time? 0 points  Count back from 20 0 points  Months in reverse 0 points  Repeat phrase 0 points  Total Score 0 points    Immunizations Immunization History  Administered Date(s) Administered   Hep A / Hep B 02/05/2017   Hepatitis B, adult 03/05/2017, 08/14/2017   Influenza,inj,Quad PF,6+ Mos 05/27/2015, 05/30/2016, 06/13/2017, 05/05/2018, 05/22/2019, 05/03/2020, 06/05/2021   Influenza,inj,quad, With Preservative 04/26/2018, 05/24/2019   Influenza-Unspecified 06/26/2012, 05/26/2014   Moderna Sars-Covid-2 Vaccination 10/05/2019, 11/02/2019   PNEUMOCOCCAL CONJUGATE-20 07/17/2021   Pneumococcal Conjugate-13 10/23/2015   Pneumococcal Polysaccharide-23 07/10/2014    TDAP status: Due, Education has been provided regarding the importance of this vaccine. Advised may receive this vaccine at local pharmacy or Health Dept. Aware to provide a copy of the vaccination record if obtained from local pharmacy or Health Dept. Verbalized acceptance and understanding.  Flu Vaccine status: Due, Education has been provided  regarding the importance of this vaccine. Advised may receive this vaccine at local pharmacy or Health Dept. Aware to provide a copy of the vaccination record if obtained from local pharmacy  or Health Dept. Verbalized acceptance and understanding.  Pneumococcal vaccine status: Up to date  Covid-19 vaccine status: Completed vaccines  Qualifies for Shingles Vaccine? Yes   Zostavax completed No   Shingrix Completed?: No.    Education has been provided regarding the importance of this vaccine. Patient has been advised to call insurance company to determine out of pocket expense if they have not yet received this vaccine. Advised may also receive vaccine at local pharmacy or Health Dept. Verbalized acceptance and understanding.  Screening Tests Health Maintenance  Topic Date Due   COLONOSCOPY (Pts 45-64yr Insurance coverage will need to be confirmed)  11/25/2017   PAP SMEAR-Modifier  05/27/2019   COVID-19 Vaccine (3 - Moderna risk series) 11/30/2019   Diabetic kidney evaluation - Urine ACR  02/14/2021   INFLUENZA VACCINE  06/26/2022 (Originally 03/26/2022)   Zoster Vaccines- Shingrix (1 of 2) 08/01/2022 (Originally 06/16/1975)   DEXA SCAN  01/15/2023 (Originally 06/15/2021)   TETANUS/TDAP  01/15/2023 (Originally 06/16/1975)   OPHTHALMOLOGY EXAM  09/13/2022   FOOT EXAM  10/09/2022   HEMOGLOBIN A1C  11/08/2022   MAMMOGRAM  03/25/2023   Diabetic kidney evaluation - GFR measurement  05/04/2023   Pneumonia Vaccine 66 Years old  Completed   Hepatitis C Screening  Completed   HIV Screening  Completed   HPV VACCINES  Aged Out    Health Maintenance  Health Maintenance Due  Topic Date Due   COLONOSCOPY (Pts 45-457yrInsurance coverage will need to be confirmed)  11/25/2017   PAP SMEAR-Modifier  05/27/2019   COVID-19 Vaccine (3 - Moderna risk series) 11/30/2019   Diabetic kidney evaluation - Urine ACR  02/14/2021    Colorectal cancer screening: Type of screening: Colonoscopy. Completed 11/26/2007. Repeat every 10 years  Mammogram status: Completed 03/24/2021. Repeat every year  Bone Density status: Completed 11/27/2021. Results reflect: Bone density results: NORMAL. Repeat every  5 years.  Lung Cancer Screening: (Low Dose CT Chest recommended if Age 66-80ears, 30 pack-year currently smoking OR have quit w/in 15years.) does not qualify.   Lung Cancer Screening Referral: NO  Additional Screening:  Hepatitis C Screening: does not qualify; Completed NO  Vision Screening: Recommended annual ophthalmology exams for early detection of glaucoma and other disorders of the eye. Is the patient up to date with their annual eye exam?  Yes  Who is the provider or what is the name of the office in which the patient attends annual [facility] exams[facility] If pt is not established with a provider, would they like to be referred to a provider to establish care? No .   Dental Screening: Recommended annual dental exams for proper oral hygiene  Community Resource Referral / Chronic Care Management: CRR required this visit?  No   CCM required this visit?  No      Plan:     I have personally reviewed and noted the following in the patient's chart:   Medical and social history Use of alcohol, tobacco or illicit drugs  Current medications and supplements including opioid prescriptions. Patient is currently taking opioid prescriptions. Information provided to patient regarding non-opioid alternatives. Patient advised to discuss non-opioid treatment plan with their provider. Functional ability and status Nutritional status Physical activity Advanced directives List of other physicians Hospitalizations, surgeries, and ER visits in previous 12 months  Vitals Screenings to include cognitive, depression, and falls Referrals and appointments  In addition, I have reviewed and discussed with patient certain preventive protocols, quality metrics, and best practice recommendations. A written personalized care plan for preventive services as well as general preventive health recommendations were provided to patient.     Sheral Flow, LPN   [id number]   Nurse  Notes: N/A

## 2022-06-11 ENCOUNTER — Other Ambulatory Visit: Payer: Self-pay | Admitting: Internal Medicine

## 2022-06-11 DIAGNOSIS — M25562 Pain in left knee: Secondary | ICD-10-CM | POA: Diagnosis not present

## 2022-06-11 DIAGNOSIS — G894 Chronic pain syndrome: Secondary | ICD-10-CM | POA: Diagnosis not present

## 2022-06-11 DIAGNOSIS — J449 Chronic obstructive pulmonary disease, unspecified: Secondary | ICD-10-CM | POA: Diagnosis not present

## 2022-06-11 DIAGNOSIS — G4733 Obstructive sleep apnea (adult) (pediatric): Secondary | ICD-10-CM

## 2022-06-11 DIAGNOSIS — I10 Essential (primary) hypertension: Secondary | ICD-10-CM

## 2022-06-11 DIAGNOSIS — M5137 Other intervertebral disc degeneration, lumbosacral region: Secondary | ICD-10-CM | POA: Diagnosis not present

## 2022-06-11 DIAGNOSIS — M25561 Pain in right knee: Secondary | ICD-10-CM | POA: Diagnosis not present

## 2022-06-12 DIAGNOSIS — E1165 Type 2 diabetes mellitus with hyperglycemia: Secondary | ICD-10-CM | POA: Diagnosis not present

## 2022-06-12 DIAGNOSIS — M1990 Unspecified osteoarthritis, unspecified site: Secondary | ICD-10-CM | POA: Diagnosis not present

## 2022-06-12 DIAGNOSIS — M0609 Rheumatoid arthritis without rheumatoid factor, multiple sites: Secondary | ICD-10-CM | POA: Diagnosis not present

## 2022-06-12 DIAGNOSIS — Z79899 Other long term (current) drug therapy: Secondary | ICD-10-CM | POA: Diagnosis not present

## 2022-06-12 DIAGNOSIS — M8580 Other specified disorders of bone density and structure, unspecified site: Secondary | ICD-10-CM | POA: Diagnosis not present

## 2022-06-17 ENCOUNTER — Other Ambulatory Visit: Payer: Self-pay | Admitting: Internal Medicine

## 2022-06-21 ENCOUNTER — Telehealth: Payer: Self-pay

## 2022-06-21 ENCOUNTER — Other Ambulatory Visit: Payer: Self-pay

## 2022-06-21 DIAGNOSIS — F419 Anxiety disorder, unspecified: Secondary | ICD-10-CM | POA: Diagnosis not present

## 2022-06-21 DIAGNOSIS — F332 Major depressive disorder, recurrent severe without psychotic features: Secondary | ICD-10-CM | POA: Diagnosis not present

## 2022-06-21 DIAGNOSIS — R69 Illness, unspecified: Secondary | ICD-10-CM | POA: Diagnosis not present

## 2022-06-21 MED ORDER — COMBIVENT RESPIMAT 20-100 MCG/ACT IN AERS: 1.0000 | INHALATION_SPRAY | Freq: Four times a day (QID) | RESPIRATORY_TRACT | 5 refills | Status: AC | PRN

## 2022-06-21 MED ORDER — ALBUTEROL SULFATE HFA 108 (90 BASE) MCG/ACT IN AERS: INHALATION_SPRAY | RESPIRATORY_TRACT | 11 refills | Status: DC

## 2022-06-21 NOTE — Telephone Encounter (Signed)
MEDICATION:  albuterol (VENTOLIN HFA) 108 (90 Base) MCG/ACT inhaler // Ipratropium-Albuterol (COMBIVENT RESPIMAT) 20-100 MCG/ACT AERS respimat  PHARMACY: [facility] - [place], [place] - [address]  Comments:   **Let patient know to contact pharmacy at the end of the day to make sure medication is ready. **  ** Please notify patient to allow 48-72 hours to process**  **Encourage patient to contact the pharmacy for refills or they can request refills through [facility]**

## 2022-06-21 NOTE — Telephone Encounter (Signed)
Scripts sent in this morning

## 2022-06-21 NOTE — Telephone Encounter (Signed)
Patient's spouse informed that Ozempic has been received via patient assistance and is ready for pickup.

## 2022-06-24 NOTE — Telephone Encounter (Signed)
Patient's spouse picked up samples of Ozempic today.

## 2022-06-27 ENCOUNTER — Encounter: Payer: Self-pay | Admitting: Gastroenterology

## 2022-06-27 ENCOUNTER — Ambulatory Visit (INDEPENDENT_AMBULATORY_CARE_PROVIDER_SITE_OTHER): Payer: Medicare HMO | Admitting: Gastroenterology

## 2022-06-27 ENCOUNTER — Other Ambulatory Visit (INDEPENDENT_AMBULATORY_CARE_PROVIDER_SITE_OTHER): Payer: Medicare HMO

## 2022-06-27 VITALS — BP 110/72 | HR 71 | Ht 69.0 in

## 2022-06-27 DIAGNOSIS — R140 Abdominal distension (gaseous): Secondary | ICD-10-CM

## 2022-06-27 DIAGNOSIS — R110 Nausea: Secondary | ICD-10-CM | POA: Diagnosis not present

## 2022-06-27 DIAGNOSIS — K7460 Unspecified cirrhosis of liver: Secondary | ICD-10-CM | POA: Diagnosis not present

## 2022-06-27 LAB — PROTIME-INR
INR: 1.1 ratio — ABNORMAL HIGH (ref 0.8–1.0)
Prothrombin Time: 12.5 s (ref 9.6–13.1)

## 2022-06-27 NOTE — Progress Notes (Signed)
[facility] progress note:  History: Mary Acosta 06/27/2022  Referring provider: Binnie Rail, MD  Reason for consult/chief complaint: pt here for Rx refill (Pt states she feel weak a lot of the time)   Subjective  HPI: Mary Acosta was last seen in the office November 2022 for cryptogenic cirrhosis.  She also has chronic upper abdominal discomfort with bloating and nausea in the setting of poorly controlled diabetes.  At a prior office visit she was given a trial of metronidazole as empiric therapy for possible SIBO, and at follow-up she could not recall if it helped. Last screening ultrasound November 2022, last AFP normal at 2.7, also November 2022. Last variceal screening EGD March 2023 with following findings: Portal hypertensive gastropathy. - Normal examined duodenum. - No specimens collected. Normal appearance of LapBand compression in gastric cardia. __________________________  Mary Acosta follows up today for cirrhosis.  She is struggling with her multiple health issues.  Since I last saw her, she was diagnosed with seronegative rheumatoid arthritis and initially put on methotrexate.  She had messaged [place] months ago asking if we thought that medicine was okay.  Might phone note indicates my concern about the increased risk of that as regards her cirrhosis, and we faxed my most recent office note to her rheumatologist.  Mary Acosta tells me she was then taken off the methotrexate but put on prednisone for some time, and that has caused her to gain a significant amount of weight and affect her diabetes.  She feels generally weak much of the time, has difficulty walking and feels off balance. Her digestive issues have remained stable, she has no active GI bleeding, has abdominal bloating as before.   ROS:  Review of Systems  Peripheral edema Short of breath with exertion Unsteady at times when walking Denies chest pain Anxiety Past Medical History: Past  Medical History:  Diagnosis Date   AKI (acute kidney injury) ([place]) 01/2017   Allergy    seasonal   Anxiety    with panic attacks   Arthritis    "back; feet; hands; shoulders" (08/26/2014)   Asthma    Cataract    forming   Cervical cancer ([place])    Chronic lower back pain    Chronic narcotic use    Chronic pain syndrome    [facility]   Cirrhosis ([place])    Clostridium difficile infection 2017   COPD (chronic obstructive pulmonary disease) (HCC)    Daily headache    past hx   Depression    Diabetic neuropathy ([place]) 06/04/2017   feet  and legs , some in hands   DJD (degenerative joint disease)    Fatty liver disease, nonalcoholic    Fibromyalgia    Frequency of urination    HCAP (healthcare-associated pneumonia) 08/26/2014   History of TIA (transient ischemic attack) 11/01/2010   NO RESIDUAL   Hyperlipidemia    Hypertension    Hypothyroidism    IDDM (insulin dependent diabetes mellitus)    Insomnia    Lumbar stenosis    Macular degeneration    Memory difficulty 07/25/2016   Nocturia    OSA (obstructive sleep apnea)    NO CPAP SINCE WT LOSS   Osteoarthritis    with severe disease in knee   Oxygen deficiency    4  liters at bedtime only   Pneumonia "several times"   Polymyalgia rheumatica ([place])    Scoliosis    Seasonal allergies    Stroke ([place])  TIA   Thyroid cancer ([place])    Urgency of urination    Vaginal pain S/P SLING  FEB 2012     Past Surgical History: Past Surgical History:  Procedure Laterality Date   APPENDECTOMY  1982   BREAST EXCISIONAL BIOPSY Left 02/28/2005   Atypical Ductal Hyperplasia   CARDIAC CATHETERIZATION  09/04/2004   NORMAL CORONARY ANATOMY/ NORMAL LVF/ EF 60%   CARDIOVASCULAR STRESS TEST  12-27-2010  DR [place]   ABNORMAL NUCLEAR STUDY W/ /MILD INFERIOR ISCHEMIA/ EF 69%/  CT HEART ANGIOGRAM ;  NO ACUTE FINDINGS   COLONOSCOPY     CRYOABLATION  05/16/2003   w/LEEP FOR ABNORMAL PAP SMEAR   CYSTOSCOPY  05/18/2012    Procedure: CYSTOSCOPY;  Surgeon: Reece Packer, MD;  Location: [facility];  Service: Urology;  Laterality: N/A;  examination under anethesia   ESOPHAGOGASTRODUODENOSCOPY (EGD) WITH PROPOFOL N/A 09/03/2016   Procedure: ESOPHAGOGASTRODUODENOSCOPY (EGD) WITH PROPOFOL;  Surgeon: Doran Stabler, MD;  Location: [facility];  Service: Gastroenterology;  Laterality: N/A;   ESOPHAGOGASTRODUODENOSCOPY (EGD) WITH PROPOFOL N/A 11/19/2021   Procedure: ESOPHAGOGASTRODUODENOSCOPY (EGD) WITH PROPOFOL;  Surgeon: Doran Stabler, MD;  Location: [facility];  Service: Gastroenterology;  Laterality: N/A;  Varices screeing, cirrhosis   HYSTEROSCOPY WITH D & C  08/19/2007   PMB   KNEE ARTHROSCOPY W/ DEBRIDEMENT Left 03/29/2006   INTERNAL DERANGEMENT/ SEVERE DJD/ MENISCUS TEARS   LAPAROSCOPIC CHOLECYSTECTOMY  06/10/2002   LAPAROSCOPIC GASTRIC BANDING  03/01/2006   TRUNCAL VAGOTOMY/ PLACEMENT OF VG BAND   REVISION TOTAL KNEE ARTHROPLASTY Left 08-29-2008; 05/2009   TONSILLECTOMY  1969   TOTAL KNEE ARTHROPLASTY Left 01/23/2007   SEVERE DJD   TOTAL THYROIDECTOMY  11/22/2005   BILATERAL THYROID NODULES-- PAPILLARY CARCINOMA (0.5CM)/ ADENOMATOID NODULES   TRANSTHORACIC ECHOCARDIOGRAM  12/27/2010   LVSF NORMAL / EF 24-58%/ GRADE I DIASTOLIC DYSFUNCTION/ MILD MITRAL REGURG. / MILDLY DILATED LEFT ATRIUM/ MILDY INCREASED SYSTOLIC PRESSURE OF PULMONARY ARTERIES   TRANSVAGINAL SUBURETERAL TAPE/ SLING  09/28/2010   MIXED URINARY INCONTINENCE   TUBAL LIGATION  1983     Family History: Family History  Problem Relation Age of Onset   Colon polyps Mother    Diabetes Mother    Heart disease Mother    Dementia Mother    Heart disease Father    High blood pressure Father    Colon cancer Maternal Uncle        x 3   Stomach cancer Paternal Uncle    Breast cancer Other        great aunts x 5   Esophageal cancer Neg Hx    Rectal cancer Neg Hx     Social History: Social History    Socioeconomic History   Marital status: Married    Spouse name: Not on file   Number of children: 2   Years of education: 12   Highest education level: Not on file  Occupational History   Occupation: disabled    Fish farm manager: UNEMPLOYED  Tobacco Use   Smoking status: Former    Packs/day: 2.50    Years: 39.00    Total pack years: 97.50    Types: Cigarettes    Quit date: 10/22/2010    Years since quitting: 11.6   Smokeless tobacco: Never  Vaping Use   Vaping Use: Never used  Substance and Sexual Activity   Alcohol use: No    Alcohol/week: 0.0 standard drinks of alcohol   Drug use: No   Sexual activity: Not  Currently  Other Topics Concern   Not on file  Social History Narrative   Lives at home w/ her husband    Right-handed   Caffeine: 1 cup of coffee per week + [employer]   Social Determinants of Health   Financial Resource Strain: Medium Risk (06/06/2022)   Overall Financial Resource Strain (CARDIA)    Difficulty of Paying Living Expenses: Somewhat hard  Food Insecurity: No Food Insecurity (06/06/2022)   Hunger Vital Sign    Worried About Running Out of Food in the Last Year: Never true    Ran Out of Food in the Last Year: Never true  Transportation Needs: No Transportation Needs (06/06/2022)   PRAPARE - Hydrologist (Medical): No    Lack of Transportation (Non-Medical): No  Physical Activity: Inactive (06/06/2022)   Exercise Vital Sign    Days of Exercise per Week: 0 days    Minutes of Exercise per Session: 0 min  Stress: No Stress Concern Present (06/06/2022)   [place]    Feeling of Stress : Only a little  Social Connections: Socially Integrated (06/06/2022)   Social Connection and Isolation Panel [NHANES]    Frequency of Communication with Friends and Family: More than three times a week    Frequency of Social Gatherings with Friends and Family: More than three times a  week    Attends Religious Services: More than 4 times per year    Active Member of [employer] or Organizations: Yes    Attends Music therapist: More than 4 times per year    Marital Status: Married    Allergies: Allergies  Allergen Reactions   Gabapentin Swelling    Swelling in legs    Losartan Other (See Comments)    Myalgias and muscle cramping    Aricept [Donepezil Hcl]     Nausea/vomiting, low BP   Hydroxyzine     hallucinations   Oxycodone Itching   Sulfa Antibiotics Nausea Only and Rash   Sulfonamide Derivatives Nausea Only and Rash    Outpatient Meds: Current Outpatient Medications  Medication Sig Dispense Refill   acetaminophen (TYLENOL) 500 MG tablet Take 1,000 mg by mouth every 6 (six) hours as needed for moderate pain.     Adalimumab (HUMIRA PEN) 40 MG/0.4ML PNKT Inject 40 mg into the skin every 14 (fourteen) days. Every other Thursday     albuterol (VENTOLIN HFA) 108 (90 Base) MCG/ACT inhaler TAKE 2 PUFFS BY MOUTH EVERY 6 HOURS AS NEEDED FOR WHEEZE OR SHORTNESS OF BREATH 18 g 11   amLODipine (NORVASC) 5 MG tablet TAKE ONE TABLET BY MOUTH EVERY MORNING and TAKE ONE TABLET BY MOUTH EVERYDAY AT BEDTIME 60 tablet 0   buprenorphine (BUTRANS) 10 MCG/HR PTWK 1 patch once a week.     busPIRone (BUSPAR) 15 MG tablet Take 15 mg by mouth daily as needed (for anxiety).     ergocalciferol (VITAMIN D2) 1.25 MG (50000 UT) capsule Take 50,000 Units by mouth once a week. Monday     fexofenadine (ALLEGRA) 180 MG tablet Take 180 mg by mouth daily as needed for allergies or rhinitis.     folic acid (FOLVITE) 1 MG tablet Take 1 mg by mouth daily.     furosemide (LASIX) 40 MG tablet TAKE ONE TABLET BY MOUTH in THE morning 30 tablet 5   gabapentin (NEURONTIN) 300 MG capsule Take 300 mg by mouth 3 (three) times daily as needed (pain).  hydrALAZINE (APRESOLINE) 25 MG tablet Take 1 tablet (25 mg total) by mouth 2 (two) times daily. Can Take Additional 25 mg Tablet for Blood  Pressure 170/80 90 tablet 2   hydrOXYzine (VISTARIL) 50 MG capsule Take 50 mg by mouth 2 (two) times daily as needed for itching or anxiety.     Insulin Glargine (BASAGLAR KWIKPEN) 100 UNIT/ML Inject 80 Units into the skin at bedtime. (Patient taking differently: Inject 160 Units into the skin at bedtime. Divided into 2 separate injections)     Insulin Lispro-aabc (LYUMJEV KWIKPEN) 200 UNIT/ML KwikPen Inject 10-20 Units into the skin 3 (three) times daily before meals. 45 mL 3   Insulin Pen Needle 32G X 4 MM MISC Use 5x a day 500 each 3   Insulin Syringe-Needle U-100 26G X 1/2" 1 ML MISC Use daily for insulin injection as directed 100 each 3   Ipratropium-Albuterol (COMBIVENT RESPIMAT) 20-100 MCG/ACT AERS respimat Inhale 1 puff into the lungs every 6 (six) hours as needed for wheezing. 4 g 5   ipratropium-albuterol (DUONEB) 0.5-2.5 (3) MG/3ML SOLN Take 3 mLs by nebulization every 6 (six) hours as needed (Wheezing or dyspnea.). 360 mL 11   levothyroxine (SYNTHROID) 150 MCG tablet Take 1 tablet (150 mcg total) by mouth daily. 90 tablet 3   metFORMIN (GLUCOPHAGE) 500 MG tablet TAKE ONE TABLET BY MOUTH EVERY MORNING and TAKE ONE TABLET BY MOUTH EVERYDAY AT BEDTIME 180 tablet 3   metoprolol tartrate (LOPRESSOR) 25 MG tablet TAKE ONE TABLET BY MOUTH EVERY MORNING and TAKE ONE TABLET BY MOUTH EVERYDAY AT BEDTIME 30 tablet 6   OXYGEN Inhale 3 L into the lungs at bedtime.     PARoxetine (PAXIL) 40 MG tablet Take 1 tablet (40 mg total) by mouth daily. 90 tablet 1   potassium chloride SA (KLOR-CON M20) 20 MEQ tablet TAKE 2 TABLETS EVERY DAY AS NEEDED FOR CRAMPING (Patient taking differently: Take 40 mEq by mouth daily as needed (for cramping).) 180 tablet 1   Propylene Glycol (SYSTANE BALANCE) 0.6 % SOLN Place 1 drop into both eyes daily as needed (dry eyes).     Semaglutide, 1 MG/DOSE, (OZEMPIC, 1 MG/DOSE,) 4 MG/3ML SOPN Inject 1 mg into the skin once a week. (Patient taking differently: Inject 1 mg into the  skin every Monday.) 9 mL 3   simvastatin (ZOCOR) 20 MG tablet Take 1 tablet (20 mg total) by mouth daily at 6 PM. 90 tablet 2   aspirin 81 MG chewable tablet Chew 81 mg by mouth daily. (Patient not taking: Reported on 06/27/2022)     cephALEXin (KEFLEX) 500 MG capsule Take 1 capsule (500 mg total) by mouth 2 (two) times daily. (Patient not taking: Reported on 06/27/2022) 14 capsule 0   diphenoxylate-atropine (LOMOTIL) 2.5-0.025 MG tablet Take 2 tablets by mouth 4 (four) times daily as needed for diarrhea or loose stools. (Patient not taking: Reported on 06/27/2022) 16 tablet 0   morphine (MS CONTIN) 15 MG 12 hr tablet Take 15 mg by mouth daily. May take 1 additional tablet in the evening as needed for pain (Patient not taking: Reported on 06/06/2022)     naloxone [facility]) nasal spray 4 mg/0.1 mL One spray in either nostril once for known/suspected opioid overdose. May repeat every 2-3 minutes in alternating nostril til EMS arrives (Patient not taking: Reported on 06/27/2022)     predniSONE (DELTASONE) 5 MG tablet Take 20 mg by mouth daily. (Patient not taking: Reported on 06/27/2022)     promethazine (PHENERGAN)  25 MG tablet Take 0.5 tablets (12.5 mg total) by mouth every 8 (eight) hours as needed for nausea or vomiting. (Patient not taking: Reported on 06/27/2022) 30 tablet 0   tiZANidine (ZANAFLEX) 4 MG tablet Take 4 mg by mouth every 8 (eight) hours as needed for muscle spasms.     traZODone (DESYREL) 150 MG tablet Take 150 mg by mouth at bedtime as needed for sleep.     No current facility-administered medications for this visit.      ___________________________________________________________________ Objective   Exam:  BP 110/72   Pulse 71   Ht 5' 9"  (1.753 m)   BMI 45.16 kg/m  Wt Readings from Last 3 Encounters:  05/10/22 (!) 305 lb 12.8 oz (138.7 kg)  03/27/22 293 lb (132.9 kg)  03/01/22 291 lb (132 kg)    General: Chronically ill-appearing, depressed affect, sitting in a  wheelchair.  Pleasant as always Eyes: sclera anicteric, no redness ENT: oral mucosa moist without lesions, no cervical or supraclavicular lymphadenopathy CV: Regular without murmur, no JVD, + mild peripheral edema but also with venous stasis changes lower legs Resp: clear to auscultation bilaterally, normal RR and effort noted GI: soft, limited by body habitus/morbid obesity.  No focal tenderness, with active bowel sounds. Skin; warm and dry, no rash or jaundice noted.  Venous stasis changes lower legs Neuro: Normal gross motor function, speech slow but fluent, no asterixis  Data:  Radiologic Studies:  Narrative & Impression  CLINICAL DATA:  Cirrhosis of liver.   EXAM: ULTRASOUND ABDOMEN LIMITED RIGHT UPPER QUADRANT   COMPARISON:  Ultrasound abdomen 03/06/2020.   FINDINGS: Gallbladder:   Surgically absent.   Common bile duct:   Diameter: 7 mm   Liver:   No focal lesion identified. Increase in parenchymal echogenicity. Portal vein is patent on color Doppler imaging with normal direction of blood flow towards the liver.   Other: None.   IMPRESSION: 1. Echogenic liver likely related to diffuse hepatocellular disease. No focal liver lesion identified. 2. Cholecystectomy.  Common bile duct measures 7 mm.     Electronically Signed   By: Ronney Asters M.D.   On: 07/13/2021 23:02      Latest Ref Rng & Units 05/03/2022    1:07 PM 02/05/2022    6:15 PM 11/23/2021   12:21 PM  CBC  WBC 4.0 - 10.5 K/uL 13.1  12.4  9.9   Hemoglobin 12.0 - 15.0 g/dL 13.0  14.1  12.6   Hematocrit 36.0 - 46.0 % 41.2  44.9  40.2   Platelets 150.0 - 400.0 K/uL 196.0  222  214.0       Latest Ref Rng & Units 05/03/2022    1:07 PM 02/05/2022   11:00 PM 02/05/2022    6:15 PM  CMP  Glucose 70 - 99 mg/dL 293   286   BUN 6 - 23 mg/dL 20   26   Creatinine 0.40 - 1.20 mg/dL 1.29   1.12   Sodium 135 - 145 mEq/L 135   139   Potassium 3.5 - 5.1 mEq/L 4.1   4.0   Chloride 96 - 112 mEq/L 96   101   CO2  19 - 32 mEq/L 31   26   Calcium 8.4 - 10.5 mg/dL 9.1   9.9   Total Protein 6.0 - 8.3 g/dL 6.8  4.9    Total Bilirubin 0.2 - 1.2 mg/dL 0.5  0.7    Alkaline Phos 39 - 117 U/L 72  43  AST 0 - 37 U/L 16  11    ALT 0 - 35 U/L 17  13     AFP 2.7, November 2022  Hemoglobin A1c 11.9 on 05/10/2022  Assessment: Encounter Diagnoses  Name Primary?   Cirrhosis of liver without ascites, unspecified hepatic cirrhosis type (HCC) Yes   Nausea without vomiting    Abdominal bloating     Cryptogenic cirrhosis, no history of ascites, GI bleeding or hepatic encephalopathy. Poorly controlled diabetes that leads to chronic digestive issues.  A multiplicity of other complex and chronic medical issues trouble her greatly.  Plan: Due for [facility] screening: Alpha-fetoprotein and right upper quadrant ultrasound with Doppler flow.  I will plan to see her in 6 months or sooner if needed.  Her liver condition has been stable, and I worry that her other medical issues present a greater threat to her long-term wellbeing, particularly her poorly controlled diabetes.  Thank you for the courtesy of this consult.  Please call me with any questions or concerns.  [name]  CC: Referring provider noted above

## 2022-06-27 NOTE — Patient Instructions (Signed)
_______________________________________________________  If you are age 66 or older, your body mass index should be between 23-30. Your Body mass index is 45.16 kg/m. If this is out of the aforementioned range listed, please consider follow up with your Primary Care Provider.  If you are age 35 or younger, your body mass index should be between 19-25. Your Body mass index is 45.16 kg/m. If this is out of the aformentioned range listed, please consider follow up with your Primary Care Provider.   ________________________________________________________  The [facility] providers would like to encourage you to use [facility] to communicate with providers for non-urgent requests or questions.  Due to long hold times on the telephone, sending your provider a message by [facility] may be a faster and more efficient way to get a response.  Please allow 48 business hours for a response.  Please remember that this is for non-urgent requests.  _______________________________________________________  Dennis Bast will be contacted by [facility] in the next 2 days to arrange a RUQ [place] with doppler.  The number on your caller ID will be [phone], please answer when they call.  If you have not heard from them in 2 days please call [phone] to schedule.    Your provider has requested that you go to the basement level for lab work before leaving today. Press "B" on the elevator. The lab is located at the first door on the left as you exit the elevator.  Due to recent changes in healthcare laws, you may see the results of your imaging and laboratory studies on MyChart before your provider has had a chance to review them.  We understand that in some cases there may be results that are confusing or concerning to you. Not all laboratory results come back in the same time frame and the provider may be waiting for multiple results in order to interpret others.  Please give [place] 48 hours in order for your  provider to thoroughly review all the results before contacting the office for clarification of your results.

## 2022-07-01 DIAGNOSIS — E119 Type 2 diabetes mellitus without complications: Secondary | ICD-10-CM | POA: Diagnosis not present

## 2022-07-01 LAB — AFP TUMOR MARKER: AFP-Tumor Marker: 2.8 ng/mL

## 2022-07-08 ENCOUNTER — Other Ambulatory Visit: Payer: Self-pay

## 2022-07-08 DIAGNOSIS — H43822 Vitreomacular adhesion, left eye: Secondary | ICD-10-CM | POA: Diagnosis not present

## 2022-07-08 DIAGNOSIS — H353231 Exudative age-related macular degeneration, bilateral, with active choroidal neovascularization: Secondary | ICD-10-CM | POA: Diagnosis not present

## 2022-07-08 DIAGNOSIS — E1165 Type 2 diabetes mellitus with hyperglycemia: Secondary | ICD-10-CM

## 2022-07-08 DIAGNOSIS — H35423 Microcystoid degeneration of retina, bilateral: Secondary | ICD-10-CM | POA: Diagnosis not present

## 2022-07-08 DIAGNOSIS — H43813 Vitreous degeneration, bilateral: Secondary | ICD-10-CM | POA: Diagnosis not present

## 2022-07-08 DIAGNOSIS — H35033 Hypertensive retinopathy, bilateral: Secondary | ICD-10-CM | POA: Diagnosis not present

## 2022-07-08 MED ORDER — BASAGLAR KWIKPEN 100 UNIT/ML ~~LOC~~ SOPN: 80.0000 [IU] | PEN_INJECTOR | Freq: Two times a day (BID) | SUBCUTANEOUS | Status: DC

## 2022-07-09 ENCOUNTER — Ambulatory Visit (HOSPITAL_COMMUNITY)
Admission: RE | Admit: 2022-07-09 | Discharge: 2022-07-09 | Disposition: A | Discharge status: Home / Self Care | Payer: Medicare HMO | Source: Ambulatory Visit | Attending: Gastroenterology | Admitting: Gastroenterology

## 2022-07-09 DIAGNOSIS — R160 Hepatomegaly, not elsewhere classified: Secondary | ICD-10-CM | POA: Diagnosis not present

## 2022-07-09 DIAGNOSIS — K7469 Other cirrhosis of liver: Secondary | ICD-10-CM | POA: Insufficient documentation

## 2022-07-09 DIAGNOSIS — R110 Nausea: Secondary | ICD-10-CM | POA: Insufficient documentation

## 2022-07-09 DIAGNOSIS — Z1289 Encounter for screening for malignant neoplasm of other sites: Secondary | ICD-10-CM | POA: Insufficient documentation

## 2022-07-09 DIAGNOSIS — K7460 Unspecified cirrhosis of liver: Secondary | ICD-10-CM | POA: Insufficient documentation

## 2022-07-09 DIAGNOSIS — R140 Abdominal distension (gaseous): Secondary | ICD-10-CM | POA: Diagnosis not present

## 2022-07-09 DIAGNOSIS — I81 Portal vein thrombosis: Secondary | ICD-10-CM | POA: Diagnosis not present

## 2022-07-09 DIAGNOSIS — K760 Fatty (change of) liver, not elsewhere classified: Secondary | ICD-10-CM | POA: Insufficient documentation

## 2022-07-09 MED ORDER — BASAGLAR KWIKPEN 100 UNIT/ML ~~LOC~~ SOPN: 80.0000 [IU] | PEN_INJECTOR | Freq: Two times a day (BID) | SUBCUTANEOUS | 0 refills | Status: DC

## 2022-07-09 NOTE — Addendum Note (Signed)
Addended by: Lauralyn Primes on: 07/09/2022 11:13 AM   Modules accepted: Orders

## 2022-07-11 ENCOUNTER — Encounter: Payer: Self-pay | Admitting: Internal Medicine

## 2022-07-11 NOTE — Progress Notes (Unsigned)
Subjective:    Patient ID: Mary Acosta, female    DOB: 28-Mar-1956, 66 y.o.   MRN: [id number]      HPI Mary Acosta is here for  Chief Complaint  Patient presents with   URI    And chest congestion , having shortness of breath when doing things    She is here for an acute visit for cold symptoms.   Her symptoms started about one week ago  She is experiencing SOB, wheezing, fatigue, ice pick right ear pain, St (resolved), occasional lightheadedness.    She has tried taking inhalers.       Medications and allergies reviewed with patient and updated if appropriate.  Current Outpatient Medications on File Prior to Visit  Medication Sig Dispense Refill   acetaminophen (TYLENOL) 500 MG tablet Take 1,000 mg by mouth every 6 (six) hours as needed for moderate pain.     Adalimumab (HUMIRA PEN) 40 MG/0.4ML PNKT Inject 40 mg into the skin every 14 (fourteen) days. Every other Thursday     albuterol (VENTOLIN HFA) 108 (90 Base) MCG/ACT inhaler TAKE 2 PUFFS BY MOUTH EVERY 6 HOURS AS NEEDED FOR WHEEZE OR SHORTNESS OF BREATH 18 g 11   amLODipine (NORVASC) 5 MG tablet TAKE ONE TABLET BY MOUTH EVERY MORNING and TAKE ONE TABLET BY MOUTH EVERYDAY AT BEDTIME 60 tablet 0   ARIPiprazole (ABILIFY) 2 MG tablet Take 2 mg by mouth daily.     buprenorphine (BUTRANS) 10 MCG/HR PTWK 1 patch once a week.     busPIRone (BUSPAR) 15 MG tablet Take 15 mg by mouth daily as needed (for anxiety).     cloNIDine (CATAPRES) 0.1 MG tablet Take 0.1 mg by mouth every 8 (eight) hours as needed.     ergocalciferol (VITAMIN D2) 1.25 MG (50000 UT) capsule Take 50,000 Units by mouth once a week. Monday     fexofenadine (ALLEGRA) 180 MG tablet Take 180 mg by mouth daily as needed for allergies or rhinitis.     folic acid (FOLVITE) 1 MG tablet Take 1 mg by mouth daily.     furosemide (LASIX) 40 MG tablet TAKE ONE TABLET BY MOUTH in THE morning 30 tablet 5   gabapentin (NEURONTIN) 300 MG capsule Take 300 mg by mouth  3 (three) times daily as needed (pain).     hydrALAZINE (APRESOLINE) 25 MG tablet Take 1 tablet (25 mg total) by mouth 2 (two) times daily. Can Take Additional 25 mg Tablet for Blood Pressure 170/80 90 tablet 2   hydrOXYzine (VISTARIL) 50 MG capsule Take 50 mg by mouth 2 (two) times daily as needed for itching or anxiety.     Insulin Glargine (BASAGLAR KWIKPEN) 100 UNIT/ML Inject 80 Units into the skin 2 (two) times daily. 150 mL 0   insulin glargine-yfgn (SEMGLEE) 100 UNIT/ML Pen Inject into the skin daily.     Insulin Lispro-aabc (LYUMJEV KWIKPEN) 200 UNIT/ML KwikPen Inject 10-20 Units into the skin 3 (three) times daily before meals. 45 mL 3   Insulin Pen Needle 32G X 4 MM MISC Use 5x a day 500 each 3   Insulin Syringe-Needle U-100 26G X 1/2" 1 ML MISC Use daily for insulin injection as directed 100 each 3   Ipratropium-Albuterol (COMBIVENT RESPIMAT) 20-100 MCG/ACT AERS respimat Inhale 1 puff into the lungs every 6 (six) hours as needed for wheezing. 4 g 5   ipratropium-albuterol (DUONEB) 0.5-2.5 (3) MG/3ML SOLN Take 3 mLs by nebulization every 6 (six) hours as needed (  Wheezing or dyspnea.). 360 mL 11   levothyroxine (SYNTHROID) 150 MCG tablet Take 1 tablet (150 mcg total) by mouth daily. 90 tablet 3   metFORMIN (GLUCOPHAGE) 500 MG tablet TAKE ONE TABLET BY MOUTH EVERY MORNING and TAKE ONE TABLET BY MOUTH EVERYDAY AT BEDTIME 180 tablet 3   methotrexate (RHEUMATREX) 2.5 MG tablet Take 10 mg by mouth once a week.     metoprolol tartrate (LOPRESSOR) 25 MG tablet TAKE ONE TABLET BY MOUTH EVERY MORNING and TAKE ONE TABLET BY MOUTH EVERYDAY AT BEDTIME 30 tablet 6   OXYGEN Inhale 3 L into the lungs at bedtime.     PARoxetine (PAXIL) 40 MG tablet Take 1 tablet (40 mg total) by mouth daily. 90 tablet 1   promethazine (PHENERGAN) 25 MG tablet Take 0.5 tablets (12.5 mg total) by mouth every 8 (eight) hours as needed for nausea or vomiting. 30 tablet 0   Propylene Glycol (SYSTANE BALANCE) 0.6 % SOLN Place 1  drop into both eyes daily as needed (dry eyes).     Semaglutide, 1 MG/DOSE, (OZEMPIC, 1 MG/DOSE,) 4 MG/3ML SOPN Inject 1 mg into the skin once a week. (Patient taking differently: Inject 1 mg into the skin every Monday.) 9 mL 3   simvastatin (ZOCOR) 20 MG tablet Take 1 tablet (20 mg total) by mouth daily at 6 PM. 90 tablet 2   tiZANidine (ZANAFLEX) 4 MG tablet Take 4 mg by mouth every 8 (eight) hours as needed for muscle spasms.     traZODone (DESYREL) 150 MG tablet Take 150 mg by mouth at bedtime as needed for sleep.     acetaminophen (TYLENOL) 500 MG tablet Take 500 mg by mouth every 6 (six) hours as needed.     Albuterol Sulfate (PROAIR RESPICLICK) 683 (90 Base) MCG/ACT AEPB 1 puff as needed Inhalation every 4 hrs     aspirin 81 MG chewable tablet Chew 81 mg by mouth daily. (Patient not taking: Reported on 06/27/2022)     cephALEXin (KEFLEX) 500 MG capsule Take 1 capsule (500 mg total) by mouth 2 (two) times daily. (Patient not taking: Reported on 06/27/2022) 14 capsule 0   diclofenac (VOLTAREN) 50 MG EC tablet Take 50 mg by mouth 2 (two) times daily as needed.     diphenoxylate-atropine (LOMOTIL) 2.5-0.025 MG tablet Take 2 tablets by mouth 4 (four) times daily as needed for diarrhea or loose stools. (Patient not taking: Reported on 06/27/2022) 16 tablet 0   hydrOXYzine (ATARAX) 50 MG tablet Take 50 mg by mouth at bedtime as needed.     morphine (MS CONTIN) 15 MG 12 hr tablet Take 15 mg by mouth daily. May take 1 additional tablet in the evening as needed for pain (Patient not taking: Reported on 06/06/2022)     naloxone [facility]) nasal spray 4 mg/0.1 mL One spray in either nostril once for known/suspected opioid overdose. May repeat every 2-3 minutes in alternating nostril til EMS arrives (Patient not taking: Reported on 06/27/2022)     potassium chloride (KLOR-CON) 20 MEQ packet Take 20 mEq by mouth once.     potassium chloride SA (KLOR-CON M20) 20 MEQ tablet TAKE 2 TABLETS EVERY DAY AS NEEDED FOR  CRAMPING (Patient not taking: Reported on 07/12/2022) 180 tablet 1   predniSONE (DELTASONE) 5 MG tablet Take 20 mg by mouth daily. (Patient not taking: Reported on 07/12/2022)     No current facility-administered medications on file prior to visit.    Review of Systems  Constitutional:  Positive for  fatigue. Negative for chills and fever.  HENT:  Positive for ear pain (right ear - ice pick like pain - intermitent) and sore throat (resolved). Negative for congestion, sinus pressure and sinus pain.   Respiratory:  Positive for shortness of breath and wheezing. Negative for cough.   Cardiovascular:  Positive for leg swelling (increased recently).  Gastrointestinal:  Positive for nausea. Negative for diarrhea.  Neurological:  Positive for light-headedness (when she gets up sometimes). Negative for headaches.       Objective:   Vitals:   07/12/22 1356  BP: 128/82  Pulse: 79  Temp: 98.7 F (37.1 C)  SpO2: 90%   BP Readings from Last 3 Encounters:  07/12/22 128/82  06/27/22 110/72  05/10/22 (!) 140/74   Wt Readings from Last 3 Encounters:  07/12/22 (!) 314 lb (142.4 kg)  05/10/22 (!) 305 lb 12.8 oz (138.7 kg)  03/27/22 293 lb (132.9 kg)   Body mass index is 46.37 kg/m.    Physical Exam Constitutional:      General: She is not in acute distress.    Appearance: Normal appearance.  HENT:     Head: Normocephalic and atraumatic.  Eyes:     Conjunctiva/sclera: Conjunctivae normal.  Cardiovascular:     Rate and Rhythm: Normal rate and regular rhythm.     Heart sounds: Normal heart sounds. No murmur heard. Pulmonary:     Effort: Pulmonary effort is normal. No respiratory distress.     Breath sounds: Normal breath sounds. No wheezing.  Musculoskeletal:     Cervical back: Neck supple.     Right lower leg: Edema (1 + edema) present.     Left lower leg: Edema (1 + edema) present.  Lymphadenopathy:     Cervical: No cervical adenopathy.  Skin:    General: Skin is warm and dry.      Findings: No rash.  Neurological:     Mental Status: She is alert. Mental status is at baseline.  Psychiatric:        Mood and Affect: Mood normal.        Behavior: Behavior normal.       DG Chest 2 View CLINICAL DATA:  Shortness of breath and wheezing for 1 week.  EXAM: CHEST - 2 VIEW  COMPARISON:  07/06/2019  FINDINGS: Normal heart size with stable mediastinal contours. Minor vascular congestion. Streaky opacity at the lung bases. No confluent consolidation. No pleural effusion or pneumothorax. No acute osseous findings.  IMPRESSION: Minor vascular congestion. Streaky bibasilar atelectasis.  Electronically Signed   By: Keith Rake M.D.   On: 07/12/2022 14:59       Assessment & Plan:    See Problem List for Assessment and Plan of chronic medical problems.

## 2022-07-12 ENCOUNTER — Ambulatory Visit (INDEPENDENT_AMBULATORY_CARE_PROVIDER_SITE_OTHER): Payer: Medicare HMO | Admitting: Internal Medicine

## 2022-07-12 ENCOUNTER — Encounter: Payer: Self-pay | Admitting: Internal Medicine

## 2022-07-12 ENCOUNTER — Ambulatory Visit (INDEPENDENT_AMBULATORY_CARE_PROVIDER_SITE_OTHER): Payer: Medicare HMO

## 2022-07-12 VITALS — BP 128/82 | HR 79 | Temp 98.7°F | Ht 69.0 in | Wt 314.0 lb

## 2022-07-12 DIAGNOSIS — J449 Chronic obstructive pulmonary disease, unspecified: Secondary | ICD-10-CM | POA: Diagnosis not present

## 2022-07-12 DIAGNOSIS — Z23 Encounter for immunization: Secondary | ICD-10-CM

## 2022-07-12 DIAGNOSIS — R0602 Shortness of breath: Secondary | ICD-10-CM

## 2022-07-12 DIAGNOSIS — R062 Wheezing: Secondary | ICD-10-CM | POA: Diagnosis not present

## 2022-07-12 DIAGNOSIS — J9811 Atelectasis: Secondary | ICD-10-CM | POA: Diagnosis not present

## 2022-07-12 LAB — COMPREHENSIVE METABOLIC PANEL
ALT: 13 U/L (ref 0–35)
AST: 23 U/L (ref 0–37)
Albumin: 4.3 g/dL (ref 3.5–5.2)
Alkaline Phosphatase: 75 U/L (ref 39–117)
BUN: 12 mg/dL (ref 6–23)
CO2: 32 mEq/L (ref 19–32)
Calcium: 9.1 mg/dL (ref 8.4–10.5)
Chloride: 95 mEq/L — ABNORMAL LOW (ref 96–112)
Creatinine, Ser: 1.1 mg/dL (ref 0.40–1.20)
GFR: 52.52 mL/min — ABNORMAL LOW (ref >60.00)
Glucose, Bld: 310 mg/dL — ABNORMAL HIGH (ref 70–99)
Potassium: 4.1 mEq/L (ref 3.5–5.1)
Sodium: 135 mEq/L (ref 135–145)
Total Bilirubin: 0.5 mg/dL (ref 0.2–1.2)
Total Protein: 7.6 g/dL (ref 6.0–8.3)

## 2022-07-12 LAB — CBC WITH DIFFERENTIAL/PLATELET
Basophils Absolute: 0 10*3/uL (ref 0.0–0.1)
Basophils Relative: 0.3 % (ref 0.0–3.0)
Eosinophils Absolute: 0.3 10*3/uL (ref 0.0–0.7)
Eosinophils Relative: 2.3 % (ref 0.0–5.0)
HCT: 42.7 % (ref 36.0–46.0)
Hemoglobin: 13.3 g/dL (ref 12.0–15.0)
Lymphocytes Relative: 19.4 % (ref 12.0–46.0)
Lymphs Abs: 2.2 10*3/uL (ref 0.7–4.0)
MCHC: 31.2 g/dL (ref 30.0–36.0)
MCV: 78.8 fl (ref 78.0–100.0)
Monocytes Absolute: 0.7 10*3/uL (ref 0.1–1.0)
Monocytes Relative: 6.2 % (ref 3.0–12.0)
Neutro Abs: 8.1 10*3/uL — ABNORMAL HIGH (ref 1.4–7.7)
Neutrophils Relative %: 71.8 % (ref 43.0–77.0)
Platelets: 246 10*3/uL (ref 150.0–400.0)
RBC: 5.41 Mil/uL — ABNORMAL HIGH (ref 3.87–5.11)
RDW: 15.1 % (ref 11.5–15.5)
WBC: 11.3 10*3/uL — ABNORMAL HIGH (ref 4.0–10.5)

## 2022-07-12 LAB — BRAIN NATRIURETIC PEPTIDE: Pro B Natriuretic peptide (BNP): 94 pg/mL (ref 0.0–100.0)

## 2022-07-12 MED ORDER — POTASSIUM CHLORIDE CRYS ER 20 MEQ PO TBCR: EXTENDED_RELEASE_TABLET | ORAL | 1 refills | Status: DC

## 2022-07-12 NOTE — Patient Instructions (Addendum)
      Have Blood work and a chest xray today downstairs.     Medications changes include :   take lasix 40 mg daily x 2 daily and the potassium 2 pills daily x 2 days    Call us Monday and update up with your symptoms   If your shortness of breath gets worse - you should be evaluated in the ED

## 2022-07-13 NOTE — Assessment & Plan Note (Signed)
Acute on chronic Shortness of breath worse for the past week or so Likely multifactorial No evidence of an infection I think her weight, deconditioning, COPD and possibly heart failure are contributing Discussed if she develops more cold symptoms we may need to consider an antibiotic, but for now we will hold off Discussed weight loss Discussed seeing a pulmonologist Chest x-ray today shows mild vascular congestion Lasix 40 mg daily x2 days then stop-take potassium with Lasix We will see if this improves her symptoms some if not may need further evaluation/treatment CBC, CMP, BNP ordered

## 2022-07-14 ENCOUNTER — Other Ambulatory Visit: Payer: Self-pay | Admitting: Adult Health

## 2022-07-15 ENCOUNTER — Telehealth: Payer: Self-pay | Admitting: Internal Medicine

## 2022-07-15 DIAGNOSIS — I5032 Chronic diastolic (congestive) heart failure: Secondary | ICD-10-CM

## 2022-07-15 NOTE — Telephone Encounter (Signed)
Spoke with spouse and info given.  My-chart sent to patient to let me know when she is available to come in for lab recheck.

## 2022-07-15 NOTE — Telephone Encounter (Signed)
He is okay to continue the Lasix daily or more ideally a few times a week, but needs to take the potassium when she takes the Lasix.  If she is taking this more regularly we will need to monitor her kidney function and potassium-I have ordered this next week would like her to try to come to the lab to get this done-does not need to fast.

## 2022-07-15 NOTE — Telephone Encounter (Signed)
Patient called to say that she is feeling better and that when she gets short of breath she has been taking lasix.

## 2022-07-16 ENCOUNTER — Other Ambulatory Visit: Payer: Self-pay | Admitting: Internal Medicine

## 2022-07-23 ENCOUNTER — Encounter: Payer: Self-pay | Admitting: Internal Medicine

## 2022-07-23 NOTE — Progress Notes (Unsigned)
Subjective:    Patient ID: Mary Acosta, female    DOB: Oct 11, 1955, 66 y.o.   MRN: [id number]      HPI [name] is here for No chief complaint on file.    Gout - R and L great toe -     Medications and allergies reviewed with patient and updated if appropriate.  Current Outpatient Medications on File Prior to Visit  Medication Sig Dispense Refill   acetaminophen (TYLENOL) 500 MG tablet Take 500 mg by mouth every 6 (six) hours as needed.     Adalimumab (HUMIRA PEN) 40 MG/0.4ML PNKT Inject 40 mg into the skin every 14 (fourteen) days. Every other Thursday     albuterol (VENTOLIN HFA) 108 (90 Base) MCG/ACT inhaler TAKE 2 PUFFS BY MOUTH EVERY 6 HOURS AS NEEDED FOR WHEEZE OR SHORTNESS OF BREATH 18 g 11   Albuterol Sulfate (PROAIR RESPICLICK) 333 (90 Base) MCG/ACT AEPB 1 puff as needed Inhalation every 4 hrs     amLODipine (NORVASC) 5 MG tablet TAKE ONE TABLET BY MOUTH EVERY MORNING and TAKE ONE TABLET BY MOUTH EVERYDAY AT BEDTIME 60 tablet 0   ARIPiprazole (ABILIFY) 2 MG tablet Take 2 mg by mouth daily.     buprenorphine (BUTRANS) 10 MCG/HR PTWK 1 patch once a week.     busPIRone (BUSPAR) 15 MG tablet Take 15 mg by mouth daily as needed (for anxiety).     cloNIDine (CATAPRES) 0.1 MG tablet Take 0.1 mg by mouth every 8 (eight) hours as needed.     diclofenac (VOLTAREN) 50 MG EC tablet Take 50 mg by mouth 2 (two) times daily as needed.     ergocalciferol (VITAMIN D2) 1.25 MG (50000 UT) capsule Take 50,000 Units by mouth once a week. Monday     fexofenadine (ALLEGRA) 180 MG tablet Take 180 mg by mouth daily as needed for allergies or rhinitis.     folic acid (FOLVITE) 1 MG tablet Take 1 mg by mouth daily.     furosemide (LASIX) 40 MG tablet TAKE ONE TABLET BY MOUTH in THE morning 30 tablet 5   gabapentin (NEURONTIN) 300 MG capsule Take 300 mg by mouth 3 (three) times daily as needed (pain).     hydrALAZINE (APRESOLINE) 25 MG tablet TAKE ONE TABLET BY MOUTH EVERY MORNING and  TAKE ONE TABLET BY MOUTH EVERYDAY AT BEDTIME and take additional tablet if bp over170/80 90 tablet 2   hydrOXYzine (ATARAX) 50 MG tablet Take 50 mg by mouth at bedtime as needed.     hydrOXYzine (VISTARIL) 50 MG capsule Take 50 mg by mouth 2 (two) times daily as needed for itching or anxiety.     Insulin Glargine (BASAGLAR KWIKPEN) 100 UNIT/ML Inject 80 Units into the skin 2 (two) times daily. 150 mL 0   insulin glargine-yfgn (SEMGLEE) 100 UNIT/ML Pen Inject into the skin daily.     Insulin Lispro-aabc (LYUMJEV KWIKPEN) 200 UNIT/ML KwikPen Inject 10-20 Units into the skin 3 (three) times daily before meals. 45 mL 3   Insulin Pen Needle 32G X 4 MM MISC Use 5x a day 500 each 3   Insulin Syringe-Needle U-100 26G X 1/2" 1 ML MISC Use daily for insulin injection as directed 100 each 3   Ipratropium-Albuterol (COMBIVENT RESPIMAT) 20-100 MCG/ACT AERS respimat Inhale 1 puff into the lungs every 6 (six) hours as needed for wheezing. 4 g 5   ipratropium-albuterol (DUONEB) 0.5-2.5 (3) MG/3ML SOLN Take 3 mLs by nebulization every 6 (six) hours  as needed (Wheezing or dyspnea.). 360 mL 11   levothyroxine (SYNTHROID) 150 MCG tablet Take 1 tablet (150 mcg total) by mouth daily. 90 tablet 3   metFORMIN (GLUCOPHAGE) 500 MG tablet TAKE ONE TABLET BY MOUTH EVERY MORNING and TAKE ONE TABLET BY MOUTH EVERYDAY AT BEDTIME 180 tablet 3   methotrexate (RHEUMATREX) 2.5 MG tablet Take 10 mg by mouth once a week.     metoprolol tartrate (LOPRESSOR) 25 MG tablet TAKE ONE TABLET BY MOUTH EVERY MORNING and TAKE ONE TABLET BY MOUTH EVERYDAY AT BEDTIME 30 tablet 6   OXYGEN Inhale 3 L into the lungs at bedtime.     PARoxetine (PAXIL) 40 MG tablet Take 1 tablet (40 mg total) by mouth daily. 90 tablet 1   potassium chloride (KLOR-CON) 20 MEQ packet Take 20 mEq by mouth once.     potassium chloride SA (KLOR-CON M20) 20 MEQ tablet TAKE 2 TABLETS EVERY DAY AS NEEDED WHEN YOU TAKE LASIX 60 tablet 1   promethazine (PHENERGAN) 25 MG tablet  Take 0.5 tablets (12.5 mg total) by mouth every 8 (eight) hours as needed for nausea or vomiting. 30 tablet 0   Propylene Glycol (SYSTANE BALANCE) 0.6 % SOLN Place 1 drop into both eyes daily as needed (dry eyes).     Semaglutide, 1 MG/DOSE, (OZEMPIC, 1 MG/DOSE,) 4 MG/3ML SOPN Inject 1 mg into the skin once a week. (Patient taking differently: Inject 1 mg into the skin every Monday.) 9 mL 3   simvastatin (ZOCOR) 20 MG tablet Take 1 tablet (20 mg total) by mouth daily at 6 PM. 90 tablet 2   tiZANidine (ZANAFLEX) 4 MG tablet Take 4 mg by mouth every 8 (eight) hours as needed for muscle spasms.     traZODone (DESYREL) 150 MG tablet Take 150 mg by mouth at bedtime as needed for sleep.     No current facility-administered medications on file prior to visit.    Review of Systems     Objective:  There were no vitals filed for this visit. BP Readings from Last 3 Encounters:  07/12/22 128/82  06/27/22 110/72  05/10/22 (!) 140/74   Wt Readings from Last 3 Encounters:  07/12/22 (!) 314 lb (142.4 kg)  05/10/22 (!) 305 lb 12.8 oz (138.7 kg)  03/27/22 293 lb (132.9 kg)   There is no height or weight on file to calculate BMI.    Physical Exam         Assessment & Plan:    See Problem List for Assessment and Plan of chronic medical problems.

## 2022-07-24 ENCOUNTER — Ambulatory Visit (INDEPENDENT_AMBULATORY_CARE_PROVIDER_SITE_OTHER): Payer: Medicare HMO | Admitting: Internal Medicine

## 2022-07-24 VITALS — BP 128/72 | HR 87 | Temp 97.9°F | Ht 69.0 in | Wt 307.0 lb

## 2022-07-24 DIAGNOSIS — M109 Gout, unspecified: Secondary | ICD-10-CM

## 2022-07-24 MED ORDER — COLCHICINE 0.6 MG PO TABS: ORAL_TABLET | ORAL | 0 refills | Status: DC

## 2022-07-24 NOTE — Patient Instructions (Addendum)
      Medications changes include :   colchicine 2 pills daily for two days and then one pill daily       Return if symptoms worsen or fail to improve.

## 2022-07-24 NOTE — Assessment & Plan Note (Signed)
Acute Right toe Has severe pain, swelling, some redness that looks chronic Possible gout Will try colchicine -- 1.2 mg daily x 2 days then 0.6 mg daily x 8 days - do not want to continue longer due to possible side effects Discussed joint pain is likely multifactorial - I doubt gout is causing her chronic joint pain

## 2022-07-26 ENCOUNTER — Encounter: Payer: Self-pay | Admitting: Internal Medicine

## 2022-08-05 ENCOUNTER — Other Ambulatory Visit: Payer: Self-pay | Admitting: Internal Medicine

## 2022-08-08 ENCOUNTER — Ambulatory Visit: Payer: Medicare HMO | Admitting: Internal Medicine

## 2022-08-08 ENCOUNTER — Encounter: Payer: Self-pay | Admitting: Internal Medicine

## 2022-08-08 DIAGNOSIS — E1165 Type 2 diabetes mellitus with hyperglycemia: Secondary | ICD-10-CM | POA: Diagnosis not present

## 2022-08-08 DIAGNOSIS — E1159 Type 2 diabetes mellitus with other circulatory complications: Secondary | ICD-10-CM

## 2022-08-08 LAB — POCT GLYCOSYLATED HEMOGLOBIN (HGB A1C): Hemoglobin A1C: 10.6 % — AB (ref 4.0–5.6)

## 2022-08-08 MED ORDER — LYUMJEV KWIKPEN 200 UNIT/ML ~~LOC~~ SOPN: 20.0000 [IU] | PEN_INJECTOR | Freq: Three times a day (TID) | SUBCUTANEOUS | 3 refills | Status: DC

## 2022-08-08 MED ORDER — FREESTYLE LIBRE 3 SENSOR MISC: 1.0000 | 3 refills | Status: DC

## 2022-08-08 MED ORDER — BASAGLAR KWIKPEN 100 UNIT/ML ~~LOC~~ SOPN: 80.0000 [IU] | PEN_INJECTOR | Freq: Two times a day (BID) | SUBCUTANEOUS | 3 refills | Status: DC

## 2022-08-08 NOTE — Patient Instructions (Addendum)
Please continue: - Metformin 500 mg 2x a day - Ozempic 1 mg weekly in a.m.  - Basaglar 80 units 2x a day - set alarms on your phone  Increase: - Lyumjev 20-25 units before meals  Please continue Levothyroxine 150 mcg daily.  Take the thyroid hormone every day, with water, at least 30 minutes before breakfast, separated by at least 4 hours from: - acid reflux medications - calcium - iron - multivitamins  Please return in 3 months with your sugar log.

## 2022-08-08 NOTE — Progress Notes (Signed)
Patient ID: Mary Acosta, female   DOB: [birth year], 66 y.o.   MRN: [id number]   HPI: Mary Acosta is a 66 y.o.-year-old female, initially referred by her PCP, Dr. Quay Burow, returning for follow-up for DM2, dx in 2000, insulin-dependent since 2011, uncontrolled, with long-term complications (peripheral neuropathy, gastroparesis, cerebrovascular disease with history of TIA 2012, DKA 2015) and also history of thyroid cancer and postsurgical hypothyroidism.  She previously saw my colleague, Dr. Loanne Drilling, up to 07/2019.  Last visit with me 3 months ago.  Interim hx: Since last visit, her increased urination and nausea resolved. She continues to see the pain clinic.   She was dx'ed with sero-negative RA before last OV >> started MTX and Prednisone >> stopped MTX 2/2 liver dysfunction and was on 15 mg Prednisone >> weight gain and very high CBGs >> now off Prednisone for 2 mo (took 5 mg yesterday). She stopped milk and sweet drinks. Also reduced sweets, only had sweets for Thanksgiving.  DM2: Reviewed HbA1c levels: Lab Results  Component Value Date   HGBA1C 11.9 (A) 05/10/2022   HGBA1C 8.1 (A) 10/09/2021   HGBA1C 8.6 (A) 06/04/2021   HGBA1C 6.9 (A) 01/15/2021   HGBA1C 7.6 (A) 10/13/2020   HGBA1C 8.1 (A) 07/05/2020   HGBA1C 10.4 (H) 05/03/2020   HGBA1C 11.0 (H) 10/22/2019   HGBA1C 9.4 (H) 07/20/2019   HGBA1C 8.8 (H) 04/01/2019   She was previously on: -  Basaglar 80 units at bedtime -[employer] >> stopped >> once a week >> 50 >> 80 >> 160 units daily >> 80 units twice a day - Semglee (misses doses) - Metformin 1000 mg with dinner >> 500 mg 2x a day - Ozempic 0.5 >> 1 mg weekly - tolerates it well - Lyumjev 10-20 units before meals Prev. On Rybelsus 3 mg before breakfast-see patient assistance pgm  She was on N and R insulin in the past. She was on metformin in the past >> did not work.  Pt checks her sugars 1-3 times a day:  - am:  117-225 (forgets Basaglar) >> 539, 589 >>  226, 281, 354, 561 - 2h after b'fast: n/c - before lunch: n/c - 2h after lunch: n/c - before dinner: 130-150 >> 220 >> 160-250 >> n/c - 2h after dinner: n/c - bedtime: 130s >> 250-260 >> <250 >> 300s >> 255, 261, 384 - nighttime: n/c Lowest sugar was 75 >> 110 >> 170 >> 300s recently >> 226; she has hypoglycemia awareness in the 70s. Highest sugar was 600... >> 200s >> 500s >> 561.  Glucometer: AccuChek, Prodigy  Pt's meals are: - Breakfast: bacon or sausage and eggs, cereal  - stopped - Lunch: salad; hot dog, sandwiches - Dinner: meat + veggies - Snacks: not so much anymore Previously drinking Kool-Aid, now stopped.  However, she is still having that she is drinking High C juice.   She is status post gastric banding with vagotomy in 2008.  She lost 148 pounds afterwards but then gained more than 100 pounds back.  No h/o pancreatitis or FH of MTC.  No CKD, last BUN/creatinine:  Lab Results  Component Value Date   BUN 12 07/12/2022   BUN 20 05/03/2022   CREATININE 1.10 07/12/2022   CREATININE 1.29 (H) 05/03/2022  Not on ACE inhibitor/ARB.  + HL; last set of lipids: Lab Results  Component Value Date   CHOL 183 10/09/2021   HDL 52.90 10/09/2021   LDLCALC 104 (H) 10/09/2021   TRIG 127.0  10/09/2021   CHOLHDL 3 10/09/2021  On Zocor 20, Zetia 10.  - last eye exam was in 2023 reportedly: no DR. Stopped IO injections for macular degeneration.  -She has pain, numbness, tingling in her feet.  Last foot exam 09/2021.  On ASA 81.  Pt has FH of DM in mother.  Thyroid cancer:  Reviewed her thyroid cancer history: She is status post total thyroidectomy - [place] Sx 2007. 11/22/2005: THYROID, THYROIDECTOMY:   - PAPILLARY THYROID CARCINOMA (0.5 CM).   - ADENOMATOID NODULES.    COMMENT   ONCOLOGY TABLE-THYROID    1. Maximum tumor size (cm): 0.5 cm   2. Tumor location: Right mid lobe   3. Multifocality: Not identified   4. Histology: Papillary thyroid carcinoma    5. Margins: Negative   6. Capsular invasion: Present   7. Extrathyroidal extension: Not identified   8. Vascular/Lymphatic invasion: Not identified   9. Lymph nodes: N/A   10. TNM code: pT1, pNX, pMX   11. Comments: Examination of the thyroidectomy specimen   grossly demonstrates multiple nodules. Histologic evaluation   demonstrates numerous adenomatoid nodules. One nodule measuring   0.5 cm demonstrates features compatible with papillary thyroid   carcinoma. The surgical margins are negative. (MS:jy) 11/25/05   Neck U/S (10/25/2020): no abnormal neck masses  Postsurgical hypothyroidism:  She takes 150 mcg levothyroxine daily: - no missed doses anymore - in am - fasting - at least 30 min from b'fast - no calcium - no iron - no multivitamins - no PPIs - not on Biotin  She did not present for repeat labs after the above change...  Reviewed her TFTs: Lab Results  Component Value Date   TSH 4.62 05/10/2022   TSH 0.13 (L) 10/09/2021   TSH 0.51 02/02/2021   TSH 0.14 (L) 08/04/2020   TSH 4.42 05/03/2020   TSH 1.50 10/22/2019   TSH 1.09 07/20/2019   TSH 1.32 04/01/2019   TSH 0.55 09/04/2018   TSH 0.90 12/26/2017   TSH 0.113 (L) 12/08/2017   TSH 0.22 (L) 09/19/2017   TSH 0.62 06/13/2017   TSH 1.43 01/22/2017   TSH 6.67 (H) 12/10/2016   Pt denies: - feeling nodules in neck - hoarseness - dysphagia - choking  She also has a history of COPD, OSA, fibromyalgia, depression, fatty liver-cirrhosis, DDD, h/o vb fx, PMR. She is seen in pain clinic in [facility].  She is on Neurontin and amitriptyline. She lost 2 sons: 2020 and 09/2020.    ROS: + see HPI  I reviewed pt's medications, allergies, PMH, social hx, family hx, and changes were documented in the history of present illness. Otherwise, unchanged from my initial visit note.  Past Medical History:  Diagnosis Date   AKI (acute kidney injury) ([place]) 01/2017   Allergy    seasonal   Anxiety    with panic attacks    Arthritis    "back; feet; hands; shoulders" (08/26/2014)   Asthma    Cataract    forming   Cervical cancer ([place])    Chronic lower back pain    Chronic narcotic use    Chronic pain syndrome    [facility]   Cirrhosis ([place])    Clostridium difficile infection 2017   COPD (chronic obstructive pulmonary disease) ([facility])    Daily headache    past hx   Depression    Diabetic neuropathy ([place]) 06/04/2017   feet  and legs , some in hands   DJD (degenerative  joint disease)    Fatty liver disease, nonalcoholic    Fibromyalgia    Frequency of urination    HCAP (healthcare-associated pneumonia) 08/26/2014   History of TIA (transient ischemic attack) 11/01/2010   NO RESIDUAL   Hyperlipidemia    Hypertension    Hypothyroidism    IDDM (insulin dependent diabetes mellitus)    Insomnia    Lumbar stenosis    Macular degeneration    Memory difficulty 07/25/2016   Nocturia    OSA (obstructive sleep apnea)    NO CPAP SINCE WT LOSS   Osteoarthritis    with severe disease in knee   Oxygen deficiency    4  liters at bedtime only   Pneumonia "several times"   Polymyalgia rheumatica ([place])    Scoliosis    Seasonal allergies    Stroke [facility])    TIA   Thyroid cancer ([place])    Urgency of urination    Vaginal pain S/P SLING  FEB 2012   Past Surgical History:  Procedure Laterality Date   APPENDECTOMY  1982   BREAST EXCISIONAL BIOPSY Left 02/28/2005   Atypical Ductal Hyperplasia   CARDIAC CATHETERIZATION  09/04/2004   NORMAL CORONARY ANATOMY/ NORMAL LVF/ EF 60%   CARDIOVASCULAR STRESS TEST  12-27-2010  DR [place]   ABNORMAL NUCLEAR STUDY W/ /MILD INFERIOR ISCHEMIA/ EF 69%/  CT HEART ANGIOGRAM ;  NO ACUTE FINDINGS   COLONOSCOPY     CRYOABLATION  05/16/2003   w/LEEP FOR ABNORMAL PAP SMEAR   CYSTOSCOPY  05/18/2012   Procedure: CYSTOSCOPY;  Surgeon: Reece Packer, MD;  Location: [facility];  Service: Urology;  Laterality: N/A;  examination under anethesia    ESOPHAGOGASTRODUODENOSCOPY (EGD) WITH PROPOFOL N/A 09/03/2016   Procedure: ESOPHAGOGASTRODUODENOSCOPY (EGD) WITH PROPOFOL;  Surgeon: Doran Stabler, MD;  Location: [facility];  Service: Gastroenterology;  Laterality: N/A;   ESOPHAGOGASTRODUODENOSCOPY (EGD) WITH PROPOFOL N/A 11/19/2021   Procedure: ESOPHAGOGASTRODUODENOSCOPY (EGD) WITH PROPOFOL;  Surgeon: Doran Stabler, MD;  Location: [facility];  Service: Gastroenterology;  Laterality: N/A;  Varices screeing, cirrhosis   HYSTEROSCOPY WITH D & C  08/19/2007   PMB   KNEE ARTHROSCOPY W/ DEBRIDEMENT Left 03/29/2006   INTERNAL DERANGEMENT/ SEVERE DJD/ MENISCUS TEARS   LAPAROSCOPIC CHOLECYSTECTOMY  06/10/2002   LAPAROSCOPIC GASTRIC BANDING  03/01/2006   TRUNCAL VAGOTOMY/ PLACEMENT OF VG BAND   REVISION TOTAL KNEE ARTHROPLASTY Left 08-29-2008; 05/2009   TONSILLECTOMY  1969   TOTAL KNEE ARTHROPLASTY Left 01/23/2007   SEVERE DJD   TOTAL THYROIDECTOMY  11/22/2005   BILATERAL THYROID NODULES-- PAPILLARY CARCINOMA (0.5CM)/ ADENOMATOID NODULES   TRANSTHORACIC ECHOCARDIOGRAM  12/27/2010   LVSF NORMAL / EF 65-46%/ GRADE I DIASTOLIC DYSFUNCTION/ MILD MITRAL REGURG. / MILDLY DILATED LEFT ATRIUM/ MILDY INCREASED SYSTOLIC PRESSURE OF PULMONARY ARTERIES   TRANSVAGINAL SUBURETERAL TAPE/ SLING  09/28/2010   MIXED URINARY INCONTINENCE   TUBAL LIGATION  1983   Social History   Socioeconomic History   Marital status: Married    Spouse name: Not on file   Number of children: 2   Years of education: 12   Highest education level: Not on file  Occupational History   Occupation: disabled    Employer: UNEMPLOYED  Tobacco Use   Smoking status: Former    Packs/day: 2.50    Years: 39.00    Total pack years: 97.50    Types: Cigarettes    Quit date: 10/22/2010    Years since quitting: 11.8   Smokeless tobacco: Never  Vaping Use   Vaping Use: Never used  Substance and Sexual Activity   Alcohol use: No    Alcohol/week: 0.0 standard drinks of  alcohol   Drug use: No   Sexual activity: Not Currently  Other Topics Concern   Not on file  Social History Narrative   Lives at home w/ her husband    Right-handed   Caffeine: 1 cup of coffee per week + [employer]   Social Determinants of Health   Financial Resource Strain: Medium Risk (06/06/2022)   Overall Financial Resource Strain (CARDIA)    Difficulty of Paying Living Expenses: Somewhat hard  Food Insecurity: No Food Insecurity (06/06/2022)   Hunger Vital Sign    Worried About Running Out of Food in the Last Year: Never true    Ran Out of Food in the Last Year: Never true  Transportation Needs: No Transportation Needs (06/06/2022)   PRAPARE - Hydrologist (Medical): No    Lack of Transportation (Non-Medical): No  Physical Activity: Inactive (06/06/2022)   Exercise Vital Sign    Days of Exercise per Week: 0 days    Minutes of Exercise per Session: 0 min  Stress: No Stress Concern Present (06/06/2022)   [place]    Feeling of Stress : Only a little  Social Connections: Socially Integrated (06/06/2022)   Social Connection and Isolation Panel [NHANES]    Frequency of Communication with Friends and Family: More than three times a week    Frequency of Social Gatherings with Friends and Family: More than three times a week    Attends Religious Services: More than 4 times per year    Active Member of [employer] or Organizations: Yes    Attends Music therapist: More than 4 times per year    Marital Status: Married  Human resources officer Violence: Not At Risk (06/06/2022)   Humiliation, Afraid, Rape, and Kick questionnaire    Fear of Current or Ex-Partner: No    Emotionally Abused: No    Physically Abused: No    Sexually Abused: No   Current Outpatient Medications on File Prior to Visit  Medication Sig Dispense Refill   acetaminophen (TYLENOL) 500 MG tablet Take 500 mg by mouth  every 6 (six) hours as needed.     Adalimumab (HUMIRA PEN) 40 MG/0.4ML PNKT Inject 40 mg into the skin every 14 (fourteen) days. Every other Thursday     albuterol (VENTOLIN HFA) 108 (90 Base) MCG/ACT inhaler TAKE 2 PUFFS BY MOUTH EVERY 6 HOURS AS NEEDED FOR WHEEZE OR SHORTNESS OF BREATH 18 g 11   Albuterol Sulfate (PROAIR RESPICLICK) 283 (90 Base) MCG/ACT AEPB 1 puff as needed Inhalation every 4 hrs     amLODipine (NORVASC) 5 MG tablet TAKE ONE TABLET BY MOUTH EVERY MORNING and TAKE ONE TABLET BY MOUTH EVERYDAY AT BEDTIME 60 tablet 0   ARIPiprazole (ABILIFY) 2 MG tablet Take 2 mg by mouth daily.     buprenorphine (BUTRANS) 20 MCG/HR PTWK 1 patch once a week.     busPIRone (BUSPAR) 15 MG tablet Take 15 mg by mouth daily as needed (for anxiety).     cloNIDine (CATAPRES) 0.1 MG tablet Take 0.1 mg by mouth every 8 (eight) hours as needed.     colchicine 0.6 MG tablet Take one tab bid x 2 days and then daily 10 tablet 0   diclofenac (VOLTAREN) 50 MG EC tablet Take 50 mg by mouth 2 (  two) times daily as needed.     ergocalciferol (VITAMIN D2) 1.25 MG (50000 UT) capsule Take 50,000 Units by mouth once a week. Monday     fexofenadine (ALLEGRA) 180 MG tablet Take 180 mg by mouth daily as needed for allergies or rhinitis.     folic acid (FOLVITE) 1 MG tablet Take 1 mg by mouth daily.     furosemide (LASIX) 40 MG tablet TAKE ONE TABLET BY MOUTH in THE morning 30 tablet 5   gabapentin (NEURONTIN) 300 MG capsule Take 300 mg by mouth 3 (three) times daily as needed (pain).     hydrALAZINE (APRESOLINE) 25 MG tablet TAKE ONE TABLET BY MOUTH EVERY MORNING and TAKE ONE TABLET BY MOUTH EVERYDAY AT BEDTIME and take additional tablet if bp over170/80 90 tablet 2   hydrOXYzine (ATARAX) 50 MG tablet Take 50 mg by mouth at bedtime as needed.     hydrOXYzine (VISTARIL) 50 MG capsule Take 50 mg by mouth 2 (two) times daily as needed for itching or anxiety.     Insulin Glargine (BASAGLAR KWIKPEN) 100 UNIT/ML Inject 80 Units  into the skin 2 (two) times daily. 150 mL 0   insulin glargine-yfgn (SEMGLEE) 100 UNIT/ML Pen Inject into the skin daily.     Insulin Lispro-aabc (LYUMJEV KWIKPEN) 200 UNIT/ML KwikPen Inject 10-20 Units into the skin 3 (three) times daily before meals. 45 mL 3   Insulin Pen Needle 32G X 4 MM MISC Use 5x a day 500 each 3   Insulin Syringe-Needle U-100 26G X 1/2" 1 ML MISC Use daily for insulin injection as directed 100 each 3   Ipratropium-Albuterol (COMBIVENT RESPIMAT) 20-100 MCG/ACT AERS respimat Inhale 1 puff into the lungs every 6 (six) hours as needed for wheezing. 4 g 5   ipratropium-albuterol (DUONEB) 0.5-2.5 (3) MG/3ML SOLN Take 3 mLs by nebulization every 6 (six) hours as needed (Wheezing or dyspnea.). 360 mL 11   levothyroxine (SYNTHROID) 150 MCG tablet Take 1 tablet (150 mcg total) by mouth daily. 90 tablet 3   metFORMIN (GLUCOPHAGE) 500 MG tablet TAKE ONE TABLET BY MOUTH EVERY MORNING and TAKE ONE TABLET BY MOUTH EVERYDAY AT BEDTIME 180 tablet 3   methotrexate (RHEUMATREX) 2.5 MG tablet Take 10 mg by mouth once a week.     metoprolol tartrate (LOPRESSOR) 25 MG tablet TAKE ONE TABLET BY MOUTH EVERY MORNING and TAKE ONE TABLET BY MOUTH EVERYDAY AT BEDTIME 30 tablet 6   OXYGEN Inhale 3 L into the lungs at bedtime.     PARoxetine (PAXIL) 40 MG tablet Take 1 tablet (40 mg total) by mouth daily. 90 tablet 1   potassium chloride (KLOR-CON) 20 MEQ packet Take 20 mEq by mouth once.     potassium chloride SA (KLOR-CON M20) 20 MEQ tablet TAKE 2 TABLETS EVERY DAY AS NEEDED WHEN YOU TAKE LASIX 180 tablet 1   promethazine (PHENERGAN) 25 MG tablet Take 0.5 tablets (12.5 mg total) by mouth every 8 (eight) hours as needed for nausea or vomiting. 30 tablet 0   Propylene Glycol (SYSTANE BALANCE) 0.6 % SOLN Place 1 drop into both eyes daily as needed (dry eyes).     Semaglutide, 1 MG/DOSE, (OZEMPIC, 1 MG/DOSE,) 4 MG/3ML SOPN Inject 1 mg into the skin once a week. (Patient taking differently: Inject 1 mg into  the skin every Monday.) 9 mL 3   simvastatin (ZOCOR) 20 MG tablet Take 1 tablet (20 mg total) by mouth daily at 6 PM. 90 tablet 2   tiZANidine (  ZANAFLEX) 4 MG tablet Take 4 mg by mouth every 8 (eight) hours as needed for muscle spasms.     traZODone (DESYREL) 150 MG tablet Take 150 mg by mouth at bedtime as needed for sleep.     No current facility-administered medications on file prior to visit.   Allergies  Allergen Reactions   Gabapentin Swelling    Swelling in legs    Losartan Other (See Comments)    Myalgias and muscle cramping    Aricept [Donepezil Hcl]     Nausea/vomiting, low BP   Aripiprazole Other (See Comments)    Patient reports Hypoglycemia   Hydroxyzine     hallucinations   Oxycodone-Acetaminophen Itching   Oxycodone Itching   Sulfa Antibiotics Nausea Only and Rash   Sulfonamide Derivatives Nausea Only and Rash   Family History  Problem Relation Age of Onset   Colon polyps Mother    Diabetes Mother    Heart disease Mother    Dementia Mother    Heart disease Father    High blood pressure Father    Colon cancer Maternal Uncle        x 3   Stomach cancer Paternal Uncle    Breast cancer Other        great aunts x 5   Esophageal cancer Neg Hx    Rectal cancer Neg Hx    PE: BP 118/68 (BP Location: Right Arm, Patient Position: Sitting, Cuff Size: Normal)   Pulse 68   Ht 5' 9"  (1.753 m)   Wt (!) 310 lb (140.6 kg)   SpO2 92%   BMI 45.78 kg/m  Wt Readings from Last 3 Encounters:  08/08/22 (!) 310 lb (140.6 kg)  07/24/22 (!) 307 lb (139.3 kg)  07/12/22 (!) 314 lb (142.4 kg)   Constitutional: overweight, in NAD Eyes:  EOMI, no exophthalmos ENT: no neck masses, no cervical lymphadenopathy Cardiovascular: RRR, No MRG Respiratory: CTA B Musculoskeletal: no deformities Skin:no rashes Neurological: no tremor with outstretched hands  ASSESSMENT: 1. DM2, insulin-dependent, uncontrolled, with complications  2.  History of thyroid cancer  3.  Postsurgical  hypothyroidism  PLAN:  1. Patient with longstanding, uncontrolled, type 2 diabetes, on metformin, long-acting insulin, and GLP-1 receptor agonist, which much worsening blood sugars before last visit after starting prednisone for rheumatoid arthritis.  She was trying a pain patch and was hoping to decrease the dose of prednisone afterwards.  She was on 50 mg daily at last visit.  Her appetite was higher and she also gained 34 pounds since our previous visit.  Upon questioning, she was still drinking sweet drinks: Hawaiian punch and milk.  I strongly advised her to stop these right away.  I also advised her to continue to [place] in 2 doses and continued the same daily dose.  I also continued metformin and Ozempic and we started her Lyumjev before meals. -At today's visit, she tells me she did improve her diet since last visit she was also able to come off steroids for the last 2 months.  Sugars remain elevated, but reviewing the trends, they are slowly improving.  She tells me that she is occasionally forgetting Engineer, agricultural.  I advised her to set alarms on her phone to take it.  Also, we need to increase Lyumjev before meals to better cover her postprandial excursions.  I also suggested a CGM and she would like to have one.  I sent the prescription to her pharmacy. - I suggested to:  Patient Instructions  Please  continue: - Metformin 500 mg 2x a day - Ozempic 1 mg weekly in a.m.  - Basaglar 80 units 2x a day - set alarms on your phone  Increase: - Lyumjev 20-25 units before meals  Please continue Levothyroxine 150 mcg daily.  Take the thyroid hormone every day, with water, at least 30 minutes before breakfast, separated by at least 4 hours from: - acid reflux medications - calcium - iron - multivitamins  Please return in 3 months with your sugar log.       - we checked her HbA1c: 10.6% (slightly lower) - advised to check sugars at different times of the day - 4x a day, rotating check times -  advised for yearly eye exams >> she is UTD - return to clinic in 3 months  2.  History of thyroid cancer -She had a papillary thyroid cancer focus in the right thyroid lobe, measuring 0.5 cm.  No vascular, lymphatic or direct extension into the surrounding tissues -No neck compression symptoms -Neck ultrasound obtained in 10/2020 showed no suspicious masses -She is most likely cured  3.  Postsurgical hypothyroidism - latest thyroid labs reviewed with pt. >> normal: Lab Results  Component Value Date   TSH 4.62 05/10/2022  - she continues on LT4 150 mcg daily - pt feels good on this dose. - we discussed about taking the thyroid hormone every day, with water, >30 minutes before breakfast, separated by >4 hours from acid reflux medications, calcium, iron, multivitamins. Pt. is taking it correctly. - will check thyroid tests at next visit  Philemon Kingdom, MD PhD [facility]

## 2022-08-09 ENCOUNTER — Encounter: Payer: Self-pay | Admitting: Internal Medicine

## 2022-08-11 DIAGNOSIS — J449 Chronic obstructive pulmonary disease, unspecified: Secondary | ICD-10-CM | POA: Diagnosis not present

## 2022-08-12 ENCOUNTER — Other Ambulatory Visit (HOSPITAL_COMMUNITY): Payer: Self-pay

## 2022-08-13 ENCOUNTER — Other Ambulatory Visit (HOSPITAL_COMMUNITY): Payer: Self-pay

## 2022-08-13 NOTE — Telephone Encounter (Signed)
Per insurance, this is not covered under Part D. I called and submitted a request under her Part B benefits. Case#[id number]

## 2022-08-14 ENCOUNTER — Other Ambulatory Visit: Payer: Self-pay | Admitting: Cardiology

## 2022-08-14 ENCOUNTER — Other Ambulatory Visit: Payer: Self-pay | Admitting: Internal Medicine

## 2022-08-15 DIAGNOSIS — H35033 Hypertensive retinopathy, bilateral: Secondary | ICD-10-CM | POA: Diagnosis not present

## 2022-08-15 DIAGNOSIS — H353211 Exudative age-related macular degeneration, right eye, with active choroidal neovascularization: Secondary | ICD-10-CM | POA: Diagnosis not present

## 2022-08-15 DIAGNOSIS — H2513 Age-related nuclear cataract, bilateral: Secondary | ICD-10-CM | POA: Diagnosis not present

## 2022-08-15 DIAGNOSIS — H43821 Vitreomacular adhesion, right eye: Secondary | ICD-10-CM | POA: Diagnosis not present

## 2022-08-15 DIAGNOSIS — H353221 Exudative age-related macular degeneration, left eye, with active choroidal neovascularization: Secondary | ICD-10-CM | POA: Diagnosis not present

## 2022-08-21 DIAGNOSIS — M533 Sacrococcygeal disorders, not elsewhere classified: Secondary | ICD-10-CM | POA: Diagnosis not present

## 2022-08-21 DIAGNOSIS — E1142 Type 2 diabetes mellitus with diabetic polyneuropathy: Secondary | ICD-10-CM | POA: Diagnosis not present

## 2022-08-21 DIAGNOSIS — Z0289 Encounter for other administrative examinations: Secondary | ICD-10-CM | POA: Diagnosis not present

## 2022-08-21 DIAGNOSIS — M1711 Unilateral primary osteoarthritis, right knee: Secondary | ICD-10-CM | POA: Diagnosis not present

## 2022-08-21 DIAGNOSIS — G894 Chronic pain syndrome: Secondary | ICD-10-CM | POA: Diagnosis not present

## 2022-08-21 DIAGNOSIS — M5137 Other intervertebral disc degeneration, lumbosacral region: Secondary | ICD-10-CM | POA: Diagnosis not present

## 2022-08-21 NOTE — Telephone Encounter (Signed)
Inbound fcall from insurance advising Elenor Legato 2 is covered and should be sent to DME supplier. DME supplies ordered via [employer] through online portal.

## 2022-08-22 DIAGNOSIS — E1159 Type 2 diabetes mellitus with other circulatory complications: Secondary | ICD-10-CM | POA: Diagnosis not present

## 2022-09-11 DIAGNOSIS — J449 Chronic obstructive pulmonary disease, unspecified: Secondary | ICD-10-CM | POA: Diagnosis not present

## 2022-09-12 DIAGNOSIS — M1711 Unilateral primary osteoarthritis, right knee: Secondary | ICD-10-CM | POA: Diagnosis not present

## 2022-09-12 DIAGNOSIS — M25562 Pain in left knee: Secondary | ICD-10-CM | POA: Diagnosis not present

## 2022-09-13 DIAGNOSIS — F4323 Adjustment disorder with mixed anxiety and depressed mood: Secondary | ICD-10-CM | POA: Diagnosis not present

## 2022-09-13 DIAGNOSIS — F332 Major depressive disorder, recurrent severe without psychotic features: Secondary | ICD-10-CM | POA: Diagnosis not present

## 2022-09-13 DIAGNOSIS — F419 Anxiety disorder, unspecified: Secondary | ICD-10-CM | POA: Diagnosis not present

## 2022-09-13 DIAGNOSIS — R69 Illness, unspecified: Secondary | ICD-10-CM | POA: Diagnosis not present

## 2022-09-18 ENCOUNTER — Other Ambulatory Visit: Payer: Self-pay | Admitting: Internal Medicine

## 2022-09-18 DIAGNOSIS — E1142 Type 2 diabetes mellitus with diabetic polyneuropathy: Secondary | ICD-10-CM | POA: Diagnosis not present

## 2022-09-22 DIAGNOSIS — E1159 Type 2 diabetes mellitus with other circulatory complications: Secondary | ICD-10-CM | POA: Diagnosis not present

## 2022-09-27 DIAGNOSIS — R69 Illness, unspecified: Secondary | ICD-10-CM | POA: Diagnosis not present

## 2022-09-27 DIAGNOSIS — F332 Major depressive disorder, recurrent severe without psychotic features: Secondary | ICD-10-CM | POA: Diagnosis not present

## 2022-09-27 DIAGNOSIS — F4323 Adjustment disorder with mixed anxiety and depressed mood: Secondary | ICD-10-CM | POA: Diagnosis not present

## 2022-09-27 DIAGNOSIS — F419 Anxiety disorder, unspecified: Secondary | ICD-10-CM | POA: Diagnosis not present

## 2022-10-01 DIAGNOSIS — E119 Type 2 diabetes mellitus without complications: Secondary | ICD-10-CM | POA: Diagnosis not present

## 2022-10-07 NOTE — Progress Notes (Signed)
Cardiology Office Note:    Date:  10/15/2022   ID:  Mary Acosta, DOB 05/06/1956, MRN [id number]  PCP:  Binnie Rail, MD  Cardiologist:  Kirk Ruths, MD  Electrophysiologist:  None   Referring MD: Binnie Rail, MD   Chief Complaint: routine follow-up for CHF and hypertension  History of Present Illness:    Mary Acosta is a 67 y.o. female with a history of normal coronaries on cardiac catheterization in 2006 and coronary calcium score in [id number], chronic diastolic CHF, paroxysmal SVT, prior stroke, COPD with pulmonary hypertension, hypertension, hyperlipidemia, type 2 diabetes mellitus, hypothyroidism, cirrhosis, fibromyalgia, polymyalgia rheumatica, and chronic pain syndrome on narcotics who is followed by Dr. Stanford Breed and presents today for routine follow-up.   Remote cardiac catheterization in 2006 showed normal coronaries. Coronary CTA in 12/2010 showed a coronary calcium score of 0 with no plaque or stenosis noted in the coronary tree but the distal LAD and distal PDA were not well visualized. Event Monitor in 12/2017 showed no significant arrhythmias. Last Echo in 06/2019 showed LVEF of 60-65% with grade 1 diastolic dysfunction, normal RV, and no significant valvular disease.  She was last seen by Jory Sims, NP, in 02/2022 at which time she had multiple complaints related to her rheumatoid arthritis symptoms but was doing well from a cardiac standpoint. She did report some occasional spikes in her BP to 200/110 which she believed was related to being on Prednisone. BP was well controlled in the office. She was advised that she could take an extra dose of Hydralazine 44m as needed for elevated BP sustained for greater than 30 minutes.   Patient presents today for follow-up. Here alone.  She reports worsening dyspnea on exertion for the last 5 to 6 months.  She states that she "loses her breath so easily" with minimal activity such as walking from her bedroom  to the living room.  She reports occasional shortness of breath at rest but it is mostly with exertion.  She denies any orthopnea, PND, or significant lower extremity edema.  She denies any chest pain.  She reports occasional brief palpitations but these do not last long.  She also reports some lightheadedness especially with position changes.   She continues to have very labile BP.  She states sometimes her BP will be quite and she has associated lightheadedness with this.  She states if she ambulates for long when her BP is low she will get very lightheaded and fall.  She denies any syncope. She also has markedly elevated BP occasionally in the mornings if she gets sufficient sleep.  She states she typically does not sleep well at night due to frequent urination.  However, when she does get good sleep, she states her BP will be so high in the morning that she can "barely open her eyes."  Her BP is sometimes as high as 250/130.  When it is this high, she has associated headache and palpitations.  She denies any other stroke symptoms during these times.  She currently is prescribed Amlodipine 5 mg twice daily, Lopressor 25 mg twice daily, and Hydralazine 25 mg twice daily.  However, she usually only takes these medications at night.  If  her BP is markedly elevated in the morning she will take the morning doses as well and then her BP will gradually come down to the 130s/80s over the next couple hours.  She also has Clonidine 0.1 mg as needed that listed on  her PTA medications but she is not sure that she is taking this.  She will go home and check. BP is 112/67 in the office today and this is without any morning medications.   Of note, she does report that she was previously diagnosed with sleep apnea but was not able to tolerate the CPAP machine. However, she states her O2 sat would drop as low as the 40% while sleeping and she uses home O2 at night. Suspect this may be contributing to markedly elevated BP  when she gets a good night sleep.    Past Medical History:  Diagnosis Date   AKI (acute kidney injury) ([place]) 01/2017   Allergy    seasonal   Anxiety    with panic attacks   Arthritis    "back; feet; hands; shoulders" (08/26/2014)   Asthma    Cataract    forming   Cervical cancer ([place])    Chronic lower back pain    Chronic narcotic use    Chronic pain syndrome    [facility]   Cirrhosis ([place])    Clostridium difficile infection 2017   COPD (chronic obstructive pulmonary disease) (HCC)    Daily headache    past hx   Depression    Diabetic neuropathy ([place]) 06/04/2017   feet  and legs , some in hands   DJD (degenerative joint disease)    Fatty liver disease, nonalcoholic    Fibromyalgia    Frequency of urination    HCAP (healthcare-associated pneumonia) 08/26/2014   History of TIA (transient ischemic attack) 11/01/2010   NO RESIDUAL   Hyperlipidemia    Hypertension    Hypothyroidism    IDDM (insulin dependent diabetes mellitus)    Insomnia    Lumbar stenosis    Macular degeneration    Memory difficulty 07/25/2016   Nocturia    OSA (obstructive sleep apnea)    NO CPAP SINCE WT LOSS   Osteoarthritis    with severe disease in knee   Oxygen deficiency    4  liters at bedtime only   Pneumonia "several times"   Polymyalgia rheumatica ([place])    Scoliosis    Seasonal allergies    Stroke [facility])    TIA   Thyroid cancer ([place])    Urgency of urination    Vaginal pain S/P SLING  FEB 2012    Past Surgical History:  Procedure Laterality Date   APPENDECTOMY  1982   BREAST EXCISIONAL BIOPSY Left 02/28/2005   Atypical Ductal Hyperplasia   CARDIAC CATHETERIZATION  09/04/2004   NORMAL CORONARY ANATOMY/ NORMAL LVF/ EF 60%   CARDIOVASCULAR STRESS TEST  12-27-2010  DR [place]   ABNORMAL NUCLEAR STUDY W/ /MILD INFERIOR ISCHEMIA/ EF 69%/  CT HEART ANGIOGRAM ;  NO ACUTE FINDINGS   COLONOSCOPY     CRYOABLATION  05/16/2003   w/LEEP FOR ABNORMAL PAP SMEAR   CYSTOSCOPY   05/18/2012   Procedure: CYSTOSCOPY;  Surgeon: Reece Packer, MD;  Location: [facility];  Service: Urology;  Laterality: N/A;  examination under anethesia   ESOPHAGOGASTRODUODENOSCOPY (EGD) WITH PROPOFOL N/A 09/03/2016   Procedure: ESOPHAGOGASTRODUODENOSCOPY (EGD) WITH PROPOFOL;  Surgeon: Doran Stabler, MD;  Location: [facility];  Service: Gastroenterology;  Laterality: N/A;   ESOPHAGOGASTRODUODENOSCOPY (EGD) WITH PROPOFOL N/A 11/19/2021   Procedure: ESOPHAGOGASTRODUODENOSCOPY (EGD) WITH PROPOFOL;  Surgeon: Doran Stabler, MD;  Location: [facility];  Service: Gastroenterology;  Laterality: N/A;  Varices screeing, cirrhosis   HYSTEROSCOPY WITH D &  C  08/19/2007   PMB   KNEE ARTHROSCOPY W/ DEBRIDEMENT Left 03/29/2006   INTERNAL DERANGEMENT/ SEVERE DJD/ MENISCUS TEARS   LAPAROSCOPIC CHOLECYSTECTOMY  06/10/2002   LAPAROSCOPIC GASTRIC BANDING  03/01/2006   TRUNCAL VAGOTOMY/ PLACEMENT OF VG BAND   REVISION TOTAL KNEE ARTHROPLASTY Left 08-29-2008; 05/2009   TONSILLECTOMY  1969   TOTAL KNEE ARTHROPLASTY Left 01/23/2007   SEVERE DJD   TOTAL THYROIDECTOMY  11/22/2005   BILATERAL THYROID NODULES-- PAPILLARY CARCINOMA (0.5CM)/ ADENOMATOID NODULES   TRANSTHORACIC ECHOCARDIOGRAM  12/27/2010   LVSF NORMAL / EF XX123456 GRADE I DIASTOLIC DYSFUNCTION/ MILD MITRAL REGURG. / MILDLY DILATED LEFT ATRIUM/ MILDY INCREASED SYSTOLIC PRESSURE OF PULMONARY ARTERIES   TRANSVAGINAL SUBURETERAL TAPE/ SLING  09/28/2010   MIXED URINARY INCONTINENCE   TUBAL LIGATION  1983    Current Medications: Current Meds  Medication Sig   Adalimumab (HUMIRA PEN) 40 MG/0.4ML PNKT Inject 40 mg into the skin every 14 (fourteen) days. Every other Thursday   albuterol (VENTOLIN HFA) 108 (90 Base) MCG/ACT inhaler TAKE 2 PUFFS BY MOUTH EVERY 6 HOURS AS NEEDED FOR WHEEZE OR SHORTNESS OF BREATH   Albuterol Sulfate (PROAIR RESPICLICK) 123XX123 (90 Base) MCG/ACT AEPB 1 puff as needed Inhalation every 4 hrs    amLODipine (NORVASC) 5 MG tablet TAKE ONE TABLET BY MOUTH AT BREAKFAST AND AT BEDTIME   buprenorphine (BUTRANS) 20 MCG/HR PTWK 1 patch once a week.   busPIRone (BUSPAR) 15 MG tablet Take 15 mg by mouth daily as needed (for anxiety).   cloNIDine (CATAPRES) 0.1 MG tablet Take 0.1 mg by mouth every 8 (eight) hours as needed.   colchicine 0.6 MG tablet Take one tab bid x 2 days and then daily   Continuous Blood Gluc Sensor (FREESTYLE LIBRE 3 SENSOR) MISC 1 each by Does not apply route every 14 (fourteen) days.   ergocalciferol (VITAMIN D2) 1.25 MG (50000 UT) capsule Take 50,000 Units by mouth once a week. Monday   fexofenadine (ALLEGRA) 180 MG tablet Take 180 mg by mouth daily as needed for allergies or rhinitis.   folic acid (FOLVITE) 1 MG tablet Take 1 mg by mouth daily.   furosemide (LASIX) 40 MG tablet TAKE ONE TABLET BY MOUTH in THE morning   gabapentin (NEURONTIN) 300 MG capsule Take 300 mg by mouth 3 (three) times daily as needed (pain).   hydrALAZINE (APRESOLINE) 25 MG tablet TAKE ONE TABLET BY MOUTH EVERY MORNING and TAKE ONE TABLET BY MOUTH EVERYDAY AT BEDTIME and take additional tablet if bp over170/80   hydrOXYzine (ATARAX) 50 MG tablet Take 50 mg by mouth at bedtime as needed.   hydrOXYzine (VISTARIL) 50 MG capsule Take 50 mg by mouth 2 (two) times daily as needed for itching or anxiety.   Insulin Glargine (BASAGLAR KWIKPEN) 100 UNIT/ML Inject 80 Units into the skin 2 (two) times daily.   Insulin Lispro-aabc (LYUMJEV KWIKPEN) 200 UNIT/ML KwikPen Inject 20-25 Units into the skin 3 (three) times daily before meals.   Insulin Pen Needle 32G X 4 MM MISC Use 5x a day   Insulin Syringe-Needle U-100 26G X 1/2" 1 ML MISC Use daily for insulin injection as directed   Ipratropium-Albuterol (COMBIVENT RESPIMAT) 20-100 MCG/ACT AERS respimat Inhale 1 puff into the lungs every 6 (six) hours as needed for wheezing.   ipratropium-albuterol (DUONEB) 0.5-2.5 (3) MG/3ML SOLN Take 3 mLs by nebulization every  6 (six) hours as needed (Wheezing or dyspnea.).   levothyroxine (SYNTHROID) 150 MCG tablet TAKE ONE TABLET BY MOUTH BEFORE BREAKFAST  metFORMIN (GLUCOPHAGE) 500 MG tablet TAKE ONE TABLET BY MOUTH EVERY MORNING and TAKE ONE TABLET BY MOUTH EVERYDAY AT BEDTIME   metoprolol succinate (TOPROL-XL) 25 MG 24 hr tablet Take 1 tablet (25 mg total) by mouth at bedtime. Take with or immediately following a meal.   OXYGEN Inhale 3 L into the lungs at bedtime.   PARoxetine (PAXIL) 40 MG tablet Take 1 tablet (40 mg total) by mouth daily.   potassium chloride SA (KLOR-CON M20) 20 MEQ tablet TAKE 2 TABLETS EVERY DAY AS NEEDED WHEN YOU TAKE LASIX   promethazine (PHENERGAN) 25 MG tablet Take 0.5 tablets (12.5 mg total) by mouth every 8 (eight) hours as needed for nausea or vomiting.   Propylene Glycol (SYSTANE BALANCE) 0.6 % SOLN Place 1 drop into both eyes daily as needed (dry eyes).   Semaglutide, 1 MG/DOSE, (OZEMPIC, 1 MG/DOSE,) 4 MG/3ML SOPN Inject 1 mg into the skin once a week. (Patient taking differently: Inject 1 mg into the skin every Monday.)   simvastatin (ZOCOR) 20 MG tablet Take 1 tablet (20 mg total) by mouth daily at 6 PM.   tiZANidine (ZANAFLEX) 4 MG tablet Take 4 mg by mouth every 8 (eight) hours as needed for muscle spasms.   traZODone (DESYREL) 150 MG tablet Take 150 mg by mouth at bedtime as needed for sleep.   [DISCONTINUED] metoprolol tartrate (LOPRESSOR) 25 MG tablet TAKE ONE TABLET EVERY MORNING and TAKE ONE TABLET EVERYDAY AT BEDTIME     Allergies:   Gabapentin, Losartan, Aricept [donepezil hcl], Aripiprazole, Hydroxyzine, Oxycodone-acetaminophen, Oxycodone, Sulfa antibiotics, and Sulfonamide derivatives   Social History   Socioeconomic History   Marital status: Married    Spouse name: Not on file   Number of children: 2   Years of education: 12   Highest education level: Not on file  Occupational History   Occupation: disabled    Employer: UNEMPLOYED  Tobacco Use   Smoking  status: Former    Packs/day: 2.50    Years: 39.00    Total pack years: 97.50    Types: Cigarettes    Quit date: 10/22/2010    Years since quitting: 11.9   Smokeless tobacco: Never  Vaping Use   Vaping Use: Never used  Substance and Sexual Activity   Alcohol use: No    Alcohol/week: 0.0 standard drinks of alcohol   Drug use: No   Sexual activity: Not Currently  Other Topics Concern   Not on file  Social History Narrative   Lives at home w/ her husband    Right-handed   Caffeine: 1 cup of coffee per week + [employer]   Social Determinants of Health   Financial Resource Strain: Medium Risk (06/06/2022)   Overall Financial Resource Strain (CARDIA)    Difficulty of Paying Living Expenses: Somewhat hard  Food Insecurity: No Food Insecurity (06/06/2022)   Hunger Vital Sign    Worried About Running Out of Food in the Last Year: Never true    Ran Out of Food in the Last Year: Never true  Transportation Needs: No Transportation Needs (06/06/2022)   PRAPARE - Hydrologist (Medical): No    Lack of Transportation (Non-Medical): No  Physical Activity: Inactive (06/06/2022)   Exercise Vital Sign    Days of Exercise per Week: 0 days    Minutes of Exercise per Session: 0 min  Stress: No Stress Concern Present (06/06/2022)   [place]  of Stress : Only a little  Social Connections: Socially Integrated (06/06/2022)   Social Connection and Isolation Panel [NHANES]    Frequency of Communication with Friends and Family: More than three times a week    Frequency of Social Gatherings with Friends and Family: More than three times a week    Attends Religious Services: More than 4 times per year    Active Member of [employer] or Organizations: Yes    Attends Music therapist: More than 4 times per year    Marital Status: Married     Family History: The patient's family history  includes Breast cancer in an other family member; Colon cancer in her maternal uncle; Colon polyps in her mother; Dementia in her mother; Diabetes in her mother; Heart disease in her father and mother; High blood pressure in her father; Stomach cancer in her paternal uncle. There is no history of Esophageal cancer or Rectal cancer.  ROS:   Please see the history of present illness.     EKGs/Labs/Other Studies Reviewed:    The following studies were reviewed:  Coronary CTA 01/16/2011: Impression: 1. Coronary calcium score = 0, placing patient in a low risk group.  2.  No plaque or stenosis noted in the coronary tree.  The distal  LAD and distal PDA were not well-visualized.  3.  Noncardiac findings to be reported by [facility].  _______________  Event Monitor 12/29/2017 to 01/27/2018 Sinus to sinus tachycardia. No significant arrhythmias.  _______________  Echocardiogram 07/07/2019: Impression: 1. Left ventricular ejection fraction, by visual estimation, is 60 to  65%. The left ventricle has normal function. There is no left ventricular  hypertrophy.   2. Left ventricular diastolic parameters are consistent with Grade I  diastolic dysfunction (impaired relaxation).   3. Global right ventricle has normal systolic function.The right  ventricular size is normal. No increase in right ventricular wall  thickness.   4. Left atrial size was normal.   5. Right atrial size was normal.   6. The mitral valve is normal in structure. Trace mitral valve  regurgitation. No evidence of mitral stenosis.   7. The tricuspid valve is normal in structure. Tricuspid valve  regurgitation is not demonstrated.   8. The aortic valve was not well visualized. Aortic valve regurgitation  is not visualized. No evidence of aortic valve sclerosis or stenosis.   9. The pulmonic valve was normal in structure. Pulmonic valve  regurgitation is not visualized.  10. The inferior vena cava is normal in size  with <50% respiratory  variability, suggesting right atrial pressure of 8 mmHg.    EKG:  EKG not ordered today.   Recent Labs: 05/10/2022: TSH 4.62 07/12/2022: ALT 13; BUN 12; Creatinine, Ser 1.10; Hemoglobin 13.3; Platelets 246.0; Potassium 4.1; Pro B Natriuretic peptide (BNP) 94.0; Sodium 135  Recent Lipid Panel    Component Value Date/Time   CHOL 183 10/09/2021 1534   CHOL 193 02/17/2020 1116   TRIG 127.0 10/09/2021 1534   HDL 52.90 10/09/2021 1534   HDL 48 02/17/2020 1116   CHOLHDL 3 10/09/2021 1534   VLDL 25.4 10/09/2021 1534   LDLCALC 104 (H) 10/09/2021 1534   LDLCALC 55 05/03/2020 1517    Physical Exam:    Vital Signs: BP 112/67   Pulse 61   Ht 5' 9"$  (1.753 m)   Wt (!) 303 lb 9.6 oz (137.7 kg)   SpO2 93%   BMI 44.83 kg/m     Wt Readings from Last 3  Encounters:  10/15/22 (!) 303 lb 9.6 oz (137.7 kg)  08/08/22 (!) 310 lb (140.6 kg)  07/24/22 (!) 307 lb (139.3 kg)     General: 67 y.o. morbidly obese Caucasian female in no acute distress. HEENT: Normocephalic and atraumatic. Sclera clear.  Neck: Supple.JVD difficult to assess due to body habitus. Heart: RRR. Distinct S1 and S2. No murmurs, gallops, or rubs.  Lungs: No increased work of breathing. Clear to ausculation bilaterally. No wheezes, rhonchi, or rales.  Abdomen: Soft, non-distended, and non-tender to palpation.  Extremities: No lower extremity edema. Hyperpigmentation of bilateral lower extremities consistent with chronic venous insufficiency.  Skin: Warm and dry. Neuro: Alert and oriented x3. No focal deficits. Psych: Normal affect. Responds appropriately.  Assessment:    1. Chronic diastolic CHF (congestive heart failure) (HCC)   2. Paroxysmal SVT (supraventricular tachycardia)   3. Primary hypertension   4. Hyperlipidemia, unspecified hyperlipidemia type   5. Type 2 diabetes mellitus with obesity ([place])   6. Dyspnea, unspecified type     Plan:    Dyspnea on Exertion Patient reports worsening  dyspnea on exertion over the last 5-6 months.  - She does not appear significantly volume overloaded on exam. - Will check BNP, BMET, CBC, and Echo.  - Suspect deconditioning and morbid obesity are playing a role but will assess for CHF as above. If above work-up is unremarkable, may need to consider an ischemic evaluation as well.   Chronic Diastolic CHF Last Echo in 06/2019 showed LVEF of 60-65% with grade 1 diastolic dysfunction, normal RV, and no significant valvular disease. - Patient reports worsening dyspnea as above. She does not appear significantly volume overloaded on exam. She weighs 303 lbs today which is up from 291 lbs at last office visit in 02/2022 but down from 314 lbs in 06/2022. - She has Lasix 67m daily listed under home medications but states she only takes this as needed for swelling. She states she only takes it once every 2-3 weeks.  - Will check BNP and Echo as above.  Paroxysmal SVT Monitor in 12/2017 showed normal sinus rhythm to sinus tachycardia but no significant arrhythmia.  - She reports occasional brief palpitations but overall stable. - Currently prescribed Lopressor 248mtwice daily but she only routinely takes this once daily. Will switch to Toprol-XL 2546maily.   Labile Hypertension BP is well controlled in the office today at 112/67. However, she reports labile BP at home. She has associated lightheadedness/dizziness when her BP is low. However, she will also occasionally have BP as high as 250/130 if she gets a good night sleep.  - Currently prescribed Amlodipine 5mg25mice daily , Lopressor 25mg68mce daily, Hydralazine 25 twice daily. However, she states she usually only takes these in the evenings. She will take morning doses if BP markedly elevated and BP will gradually come down to the 130s/80s.  - Will stop Lopressor and switch to Toprol-XL 25mg 62my. Otherwise, OK for her continue to take Amlodipine and Hydralazine only at night and can take the  morning dose PRN for markedly elevated BP.  - Asked patient to keep a BP/HR log for 2 weeks and to check her vitals twice daily.   Of note, she does report that she was previously diagnosed with sleep apnea but was not able to tolerate the CPAP machine. However, she states her O2 sat would drop as low as the 40% while sleeping and she uses home O2 at night. Suspect this may be contributing to markedly  elevated BP when she gets a good night sleep.   Hyperlipidemia Lipid panel in 09/2021: Total Cholesterol 183, Triglycerides 127, HDL 52.90, LDL 104.  - Continue Simvastatin 50m daily.  - Did not have time to discuss today.  Type 2 Diabetes Mellitus Hemoglobin A1c in 11.9 in 04/2022. - On Metformin, Semaglutide, and Insulin. - Management per Endocrinology.  Disposition: Follow up in 1 month after Echo.   Medication Adjustments/Labs and Tests Ordered: Current medicines are reviewed at length with the patient today.  Concerns regarding medicines are outlined above.  Orders Placed This Encounter  Procedures   Basic metabolic panel   Brain natriuretic peptide   CBC   ECHOCARDIOGRAM COMPLETE   Meds ordered this encounter  Medications   metoprolol succinate (TOPROL-XL) 25 MG 24 hr tablet    Sig: Take 1 tablet (25 mg total) by mouth at bedtime. Take with or immediately following a meal.    Dispense:  90 tablet    Refill:  3    Patient Instructions  Medication Instructions:  Start taking Metoprolol Succinate 267mevery night STOP TAKING METOPROLOL TARTRATE *If you need a refill on your cardiac medications before your next appointment, please call your pharmacy*   Lab Work: CBC, BMP, BNP If you have labs (blood work) drawn today and your tests are completely normal, you will receive your results only by: MyBox Elderif you have MyChart) OR A paper copy in the mail If you have any lab test that is abnormal or we need to change your treatment, we will call you to review the  results.   Testing/Procedures: Your physician has requested that you have an echocardiogram. Echocardiography is a painless test that uses sound waves to create images of your heart. It provides your doctor with information about the size and shape of your heart and how well your heart's chambers and valves are working. This procedure takes approximately one hour. There are no restrictions for this procedure. Please do NOT wear cologne, perfume, aftershave, or lotions (deodorant is allowed). Please arrive 15 minutes prior to your appointment time.    Follow-Up: [facility].  As part of our continuing mission to provide you with exceptional heart care, we have created designated Provider Care Teams.  These Care Teams include your primary Cardiologist (physician) and Advanced Practice Providers (APPs -  Physician Assistants and Nurse Practitioners) who all work together to provide you with the care you need, when you need it.  We recommend signing up for the patient portal called "MyChart".  Sign up information is provided on this After Visit Summary.  MyChart is used to connect with patients for Virtual Visits (Telemedicine).  Patients are able to view lab/test results, encounter notes, upcoming appointments, etc.  Non-urgent messages can be sent to your provider as well.   To learn more about what you can do with MyChart, go to [email or web address]   Your next appointment:    1 month after Echo  Provider:   CaSande RivesPA-C  Other Instructions Log your BP and pulse for 2 weeks- every morning and night and send to [facility] MyChart or you can drop it off.    SiLiane ComberPA-C  10/15/2022 12:55 PM    [facility]

## 2022-10-09 DIAGNOSIS — M1990 Unspecified osteoarthritis, unspecified site: Secondary | ICD-10-CM | POA: Diagnosis not present

## 2022-10-09 DIAGNOSIS — E1165 Type 2 diabetes mellitus with hyperglycemia: Secondary | ICD-10-CM | POA: Diagnosis not present

## 2022-10-09 DIAGNOSIS — M0609 Rheumatoid arthritis without rheumatoid factor, multiple sites: Secondary | ICD-10-CM | POA: Diagnosis not present

## 2022-10-09 DIAGNOSIS — M8580 Other specified disorders of bone density and structure, unspecified site: Secondary | ICD-10-CM | POA: Diagnosis not present

## 2022-10-09 DIAGNOSIS — Z79899 Other long term (current) drug therapy: Secondary | ICD-10-CM | POA: Diagnosis not present

## 2022-10-12 DIAGNOSIS — J449 Chronic obstructive pulmonary disease, unspecified: Secondary | ICD-10-CM | POA: Diagnosis not present

## 2022-10-15 ENCOUNTER — Encounter: Payer: Self-pay | Admitting: Student

## 2022-10-15 ENCOUNTER — Telehealth: Payer: Self-pay | Admitting: Emergency Medicine

## 2022-10-15 ENCOUNTER — Ambulatory Visit: Payer: Medicare HMO | Attending: Student | Admitting: Student

## 2022-10-15 VITALS — BP 112/67 | HR 61 | Ht 69.0 in | Wt 303.6 lb

## 2022-10-15 DIAGNOSIS — E785 Hyperlipidemia, unspecified: Secondary | ICD-10-CM | POA: Diagnosis not present

## 2022-10-15 DIAGNOSIS — E1169 Type 2 diabetes mellitus with other specified complication: Secondary | ICD-10-CM | POA: Diagnosis not present

## 2022-10-15 DIAGNOSIS — E669 Obesity, unspecified: Secondary | ICD-10-CM

## 2022-10-15 DIAGNOSIS — I10 Essential (primary) hypertension: Secondary | ICD-10-CM

## 2022-10-15 DIAGNOSIS — I5032 Chronic diastolic (congestive) heart failure: Secondary | ICD-10-CM

## 2022-10-15 DIAGNOSIS — R0600 Dyspnea, unspecified: Secondary | ICD-10-CM | POA: Diagnosis not present

## 2022-10-15 DIAGNOSIS — I471 Supraventricular tachycardia, unspecified: Secondary | ICD-10-CM | POA: Diagnosis not present

## 2022-10-15 MED ORDER — METOPROLOL SUCCINATE ER 25 MG PO TB24: 25.0000 mg | ORAL_TABLET | Freq: Every evening | ORAL | 3 refills | Status: DC

## 2022-10-15 NOTE — Patient Instructions (Signed)
Medication Instructions:  Start taking Metoprolol Succinate 31m every night STOP TAKING METOPROLOL TARTRATE *If you need a refill on your cardiac medications before your next appointment, please call your pharmacy*   Lab Work: CBC, BMP, BNP If you have labs (blood work) drawn today and your tests are completely normal, you will receive your results only by: [place](if you have MyChart) OR A paper copy in the mail If you have any lab test that is abnormal or we need to change your treatment, we will call you to review the results.   Testing/Procedures: Your physician has requested that you have an echocardiogram. Echocardiography is a painless test that uses sound waves to create images of your heart. It provides your doctor with information about the size and shape of your heart and how well your heart's chambers and valves are working. This procedure takes approximately one hour. There are no restrictions for this procedure. Please do NOT wear cologne, perfume, aftershave, or lotions (deodorant is allowed). Please arrive 15 minutes prior to your appointment time.    Follow-Up: [facility] and your health needs are our priority.  As part of our continuing mission to provide you with exceptional heart care, we have created designated Provider Care Teams.  These Care Teams include your primary Cardiologist (physician) and Advanced Practice Providers (APPs -  Physician Assistants and Nurse Practitioners) who all work together to provide you with the care you need, when you need it.  We recommend signing up for the patient portal called "MyChart".  Sign up information is provided on this After Visit Summary.  MyChart is used to connect with patients for Virtual Visits (Telemedicine).  Patients are able to view lab/test results, encounter notes, upcoming appointments, etc.  Non-urgent messages can be sent to your provider as well.   To learn more about what you can do  with MyChart, go to [email or web address]    Your next appointment:    1 month after Echo  Provider:   CSande Rives PA-C  Other Instructions Log your BP and pulse for 2 weeks- every morning and night and send to [facility] MyChart or you can drop it off.

## 2022-10-15 NOTE — Telephone Encounter (Signed)
Called the pharmacy, no answer- all reps were busy, asked to leave a message. Updated- We sent in a prescription for Metoprolol Succinate 80m nightly and have STOPPED the Metoprolol Tartrate. Wanted to make sure that the Metoprolol Tartrate IS NO LONGER in the patient's pill packs, and NEEDS TO BE REPLACED WITH METOPROLOL SUCCINATE 231m Gave call back number for any questions.

## 2022-10-16 DIAGNOSIS — H353221 Exudative age-related macular degeneration, left eye, with active choroidal neovascularization: Secondary | ICD-10-CM | POA: Diagnosis not present

## 2022-10-16 DIAGNOSIS — H43821 Vitreomacular adhesion, right eye: Secondary | ICD-10-CM | POA: Diagnosis not present

## 2022-10-16 DIAGNOSIS — H2513 Age-related nuclear cataract, bilateral: Secondary | ICD-10-CM | POA: Diagnosis not present

## 2022-10-16 DIAGNOSIS — H353211 Exudative age-related macular degeneration, right eye, with active choroidal neovascularization: Secondary | ICD-10-CM | POA: Diagnosis not present

## 2022-10-16 DIAGNOSIS — H35033 Hypertensive retinopathy, bilateral: Secondary | ICD-10-CM | POA: Diagnosis not present

## 2022-10-16 LAB — CBC
Hematocrit: 44 % (ref 34.0–46.6)
Hemoglobin: 13.4 g/dL (ref 11.1–15.9)
MCH: 23.3 pg — ABNORMAL LOW (ref 26.6–33.0)
MCHC: 30.5 g/dL — ABNORMAL LOW (ref 31.5–35.7)
MCV: 77 fL — ABNORMAL LOW (ref 79–97)
Platelets: 203 10*3/uL (ref 150–450)
RBC: 5.75 x10E6/uL — ABNORMAL HIGH (ref 3.77–5.28)
RDW: 16.3 % — ABNORMAL HIGH (ref 11.7–15.4)
WBC: 11.9 10*3/uL — ABNORMAL HIGH (ref 3.4–10.8)

## 2022-10-16 LAB — BASIC METABOLIC PANEL
BUN/Creatinine Ratio: 13 (ref 12–28)
BUN: 17 mg/dL (ref 8–27)
CO2: 27 mmol/L (ref 20–29)
Calcium: 9.3 mg/dL (ref 8.7–10.3)
Chloride: 94 mmol/L — ABNORMAL LOW (ref 96–106)
Creatinine, Ser: 1.3 mg/dL — ABNORMAL HIGH (ref 0.57–1.00)
Glucose: 100 mg/dL — ABNORMAL HIGH (ref 70–99)
Potassium: 5.1 mmol/L (ref 3.5–5.2)
Sodium: 137 mmol/L (ref 134–144)
eGFR: 45 mL/min/{1.73_m2} — ABNORMAL LOW (ref >59)

## 2022-10-16 LAB — BRAIN NATRIURETIC PEPTIDE: BNP: 37.7 pg/mL (ref 0.0–100.0)

## 2022-10-18 ENCOUNTER — Other Ambulatory Visit: Payer: Self-pay

## 2022-10-18 ENCOUNTER — Other Ambulatory Visit: Payer: Self-pay | Admitting: Internal Medicine

## 2022-10-23 DIAGNOSIS — E1159 Type 2 diabetes mellitus with other circulatory complications: Secondary | ICD-10-CM | POA: Diagnosis not present

## 2022-10-25 DIAGNOSIS — R69 Illness, unspecified: Secondary | ICD-10-CM | POA: Diagnosis not present

## 2022-10-25 DIAGNOSIS — F419 Anxiety disorder, unspecified: Secondary | ICD-10-CM | POA: Diagnosis not present

## 2022-10-25 DIAGNOSIS — F4323 Adjustment disorder with mixed anxiety and depressed mood: Secondary | ICD-10-CM | POA: Diagnosis not present

## 2022-10-25 DIAGNOSIS — F332 Major depressive disorder, recurrent severe without psychotic features: Secondary | ICD-10-CM | POA: Diagnosis not present

## 2022-10-25 DIAGNOSIS — F1911 Other psychoactive substance abuse, in remission: Secondary | ICD-10-CM | POA: Diagnosis not present

## 2022-11-07 ENCOUNTER — Other Ambulatory Visit: Payer: Self-pay | Admitting: Cardiology

## 2022-11-10 DIAGNOSIS — J449 Chronic obstructive pulmonary disease, unspecified: Secondary | ICD-10-CM | POA: Diagnosis not present

## 2022-11-14 ENCOUNTER — Other Ambulatory Visit: Payer: Self-pay | Admitting: Cardiology

## 2022-11-14 ENCOUNTER — Ambulatory Visit (HOSPITAL_COMMUNITY): Payer: Medicare HMO | Attending: Cardiology

## 2022-11-14 DIAGNOSIS — R0600 Dyspnea, unspecified: Secondary | ICD-10-CM | POA: Diagnosis not present

## 2022-11-14 DIAGNOSIS — R0609 Other forms of dyspnea: Secondary | ICD-10-CM

## 2022-11-14 LAB — ECHOCARDIOGRAM COMPLETE
Est EF: >75
S' Lateral: 1.7 cm

## 2022-11-14 MED ORDER — PERFLUTREN LIPID MICROSPHERE
1.0000 mL | INTRAVENOUS | Status: AC | PRN
  Administered 2022-11-14: 2 mL via INTRAVENOUS

## 2022-11-15 DIAGNOSIS — G894 Chronic pain syndrome: Secondary | ICD-10-CM | POA: Diagnosis not present

## 2022-11-15 DIAGNOSIS — M533 Sacrococcygeal disorders, not elsewhere classified: Secondary | ICD-10-CM | POA: Diagnosis not present

## 2022-11-15 DIAGNOSIS — M5137 Other intervertebral disc degeneration, lumbosacral region: Secondary | ICD-10-CM | POA: Diagnosis not present

## 2022-11-15 DIAGNOSIS — E1142 Type 2 diabetes mellitus with diabetic polyneuropathy: Secondary | ICD-10-CM | POA: Diagnosis not present

## 2022-11-19 ENCOUNTER — Telehealth: Payer: Self-pay | Admitting: Internal Medicine

## 2022-11-19 NOTE — Telephone Encounter (Signed)
I am a little concerned about the Celebrex because it is not very good for the kidneys.

## 2022-11-19 NOTE — Telephone Encounter (Signed)
Prescription Request  11/19/2022  LOV: 07/24/2022  What is the name of the medication or equipment? celecoxib (CELEBREX) 100 MG capsule   Have you contacted your pharmacy to request a refill? Yes   Which pharmacy would you like this sent to?  [facility] - [place], [place] - [address] [place] [address] Phone: [phone] Fax: [phone]   Patient notified that their request is being sent to the clinical staff for review and that they should receive a response within 2 business days.   Please advise at Mobile [phone] (mobile)

## 2022-11-20 NOTE — Telephone Encounter (Signed)
Spoke with patient today and she states medication was prescribed by pain management and that she was already given a refill.

## 2022-11-21 ENCOUNTER — Encounter: Payer: Self-pay | Admitting: Internal Medicine

## 2022-11-21 ENCOUNTER — Telehealth (INDEPENDENT_AMBULATORY_CARE_PROVIDER_SITE_OTHER): Payer: Medicare HMO | Admitting: Internal Medicine

## 2022-11-21 DIAGNOSIS — E1165 Type 2 diabetes mellitus with hyperglycemia: Secondary | ICD-10-CM

## 2022-11-21 DIAGNOSIS — Z85850 Personal history of malignant neoplasm of thyroid: Secondary | ICD-10-CM

## 2022-11-21 DIAGNOSIS — E890 Postprocedural hypothyroidism: Secondary | ICD-10-CM

## 2022-11-21 DIAGNOSIS — E782 Mixed hyperlipidemia: Secondary | ICD-10-CM

## 2022-11-21 DIAGNOSIS — E1159 Type 2 diabetes mellitus with other circulatory complications: Secondary | ICD-10-CM

## 2022-11-21 MED ORDER — INSULIN PEN NEEDLE 32G X 4 MM MISC: 3 refills | Status: DC

## 2022-11-21 MED ORDER — HUMULIN R U-500 KWIKPEN 500 UNIT/ML ~~LOC~~ SOPN: PEN_INJECTOR | SUBCUTANEOUS | 3 refills | Status: DC

## 2022-11-21 MED ORDER — OZEMPIC (1 MG/DOSE) 4 MG/3ML ~~LOC~~ SOPN: 1.0000 mg | PEN_INJECTOR | SUBCUTANEOUS | 3 refills | Status: DC

## 2022-11-21 NOTE — Progress Notes (Signed)
Patient ID: Mary Acosta, female   DOB: 27-Nov-1955, 67 y.o.   MRN: [id number]   Patient location: Home My location: Office Persons participating in the virtual visit: patient, provider  Referring Provider: Binnie Rail, MD  I connected with the patient on 11/21/22 at  3:20 PM EDT by a video enabled telemedicine application and verified that I am speaking with the correct person.   I discussed the limitations of evaluation and management by telemedicine and the availability of in person appointments. The patient expressed understanding and agreed to proceed.   Details of the encounter are shown below.  HPI: Mary Acosta is a 67 y.o.-year-old female, initially referred by her PCP, Dr. Quay Burow, returning for follow-up for DM2, dx in 2000, insulin-dependent since 2011, uncontrolled, with long-term complications (peripheral neuropathy, gastroparesis, cerebrovascular disease with history of TIA 2012, DKA 2015) and also history of thyroid cancer and postsurgical hypothyroidism.  She previously saw my colleague, Dr. Loanne Drilling, up to 07/2019.  Last visit with me 3 months ago.  Interim hx: No increased urination, blurry vision, nausea, chest pain. She was dx'ed with sero-negative RA before last OV >> started MTX and Prednisone >> stopped MTX 2/2 liver dysfunction and was on 15 mg Prednisone >> weight gain and very high CBGs >> came off prednisone.  DM2: Reviewed HbA1c levels: Lab Results  Component Value Date   HGBA1C 10.6 (A) 08/08/2022   HGBA1C 11.9 (A) 05/10/2022   HGBA1C 8.1 (A) 10/09/2021   HGBA1C 8.6 (A) 06/04/2021   HGBA1C 6.9 (A) 01/15/2021   HGBA1C 7.6 (A) 10/13/2020   HGBA1C 8.1 (A) 07/05/2020   HGBA1C 10.4 (H) 05/03/2020   HGBA1C 11.0 (H) 10/22/2019   HGBA1C 9.4 (H) 07/20/2019   She was previously on: - Basaglar 80 units at bedtime -[employer] >> stopped >> once a week >> 50 >> 80 >> 160 units daily >> 80 units twice a day  - Metformin 1000 mg with dinner >> 500  mg 2x a day - Ozempic 0.5 >> 1 mg weekly - - Lyumjev 10-20 >> 20-25 units before meals >> muscle cramps >> decreased: 14 units Prev. On Rybelsus 3 mg before breakfast-see patient assistance pgm  She was on N and R insulin in the past. She was on metformin in the past >> did not work. She was prev. On Toujeo 180 units daily.  Pt checks her sugars 4x  times a day - now with CGM (she checks with the receiver so we could not download the reports today): - am:   539, 589 >> 226, 281, 354, 561 >> 250-275 - 2h after b'fast: n/c - before lunch: n/c >> 320s - 2h after lunch: n/c - before dinner: 130-150 >> 220 >> 160-250 >> n/c >> 300s - 2h after dinner: n/c - bedtime:  250-260 >> <250 >> 300s >> 255, 261, 384 >> HI - nighttime: n/c Lowest sugar was 75 >> 110 >> 170 >> 300s recently >> 226 >> 250; she has hypoglycemia awareness in the 70s. Highest sugar was 600... >> 200s >> 500s >> 561 >> Hi.  Glucometer: AccuChek, Prodigy  Pt's meals are: - Breakfast: bacon or sausage and eggs, cereal >> grits, or grilled cheese - Lunch: salad; hot dog, sandwiches >> ham and cheese sandwich or skips - Dinner: meat + veggies  >> larger - Snacks: not so much anymore Previously drinking Kool-Aid, now stopped.  However, she is still having that she is drinking High C juice.  She is status post gastric banding with vagotomy in 2008.  She lost 148 pounds afterwards but then gained more than 100 pounds back.  No h/o pancreatitis or FH of MTC.  No CKD, last BUN/creatinine:  Lab Results  Component Value Date   BUN 17 10/15/2022   BUN 12 07/12/2022   CREATININE 1.30 (H) 10/15/2022   CREATININE 1.10 07/12/2022  Not on ACE inhibitor/ARB.  + HL; last set of lipids: Lab Results  Component Value Date   CHOL 183 10/09/2021   HDL 52.90 10/09/2021   LDLCALC 104 (H) 10/09/2021   TRIG 127.0 10/09/2021   CHOLHDL 3 10/09/2021  On Zocor 20, Zetia 10.  - last eye exam was in 2023 reportedly: no DR. Stopped IO  injections for macular degeneration.  -She has pain, numbness, tingling in her feet.  Last foot exam 09/2021.  On ASA 81.  Pt has FH of DM in mother.  Thyroid cancer:  Reviewed her thyroid cancer history: She is status post total thyroidectomy - [place] Sx 2007. 11/22/2005: THYROID, THYROIDECTOMY:   - PAPILLARY THYROID CARCINOMA (0.5 CM).   - ADENOMATOID NODULES.    COMMENT   ONCOLOGY TABLE-THYROID    1. Maximum tumor size (cm): 0.5 cm   2. Tumor location: Right mid lobe   3. Multifocality: Not identified   4. Histology: Papillary thyroid carcinoma   5. Margins: Negative   6. Capsular invasion: Present   7. Extrathyroidal extension: Not identified   8. Vascular/Lymphatic invasion: Not identified   9. Lymph nodes: N/A   10. TNM code: pT1, pNX, pMX   11. Comments: Examination of the thyroidectomy specimen   grossly demonstrates multiple nodules. Histologic evaluation   demonstrates numerous adenomatoid nodules. One nodule measuring   0.5 cm demonstrates features compatible with papillary thyroid   carcinoma. The surgical margins are negative. (MS:jy) 11/25/05   Neck U/S (10/25/2020): no abnormal neck masses  Postsurgical hypothyroidism:  She takes 150 mcg levothyroxine daily: - no missed doses anymore - in am - fasting - at least 30 min from b'fast - no calcium - no iron - no multivitamins - no PPIs - not on Biotin  Reviewed her TFTs: Lab Results  Component Value Date   TSH 4.62 05/10/2022   TSH 0.13 (L) 10/09/2021   TSH 0.51 02/02/2021   TSH 0.14 (L) 08/04/2020   TSH 4.42 05/03/2020   TSH 1.50 10/22/2019   TSH 1.09 07/20/2019   TSH 1.32 04/01/2019   TSH 0.55 09/04/2018   TSH 0.90 12/26/2017   TSH 0.113 (L) 12/08/2017   TSH 0.22 (L) 09/19/2017   TSH 0.62 06/13/2017   TSH 1.43 01/22/2017   TSH 6.67 (H) 12/10/2016   Pt denies: - feeling nodules in neck - hoarseness - dysphagia - choking  She also has a history of COPD, OSA, fibromyalgia,  depression, fatty liver-cirrhosis, DDD, h/o vb fx, PMR. She is seen in [facility] clinic in [facility]  She is on Neurontin and amitriptyline. She lost 2 sons: 2020 and 09/2020.    ROS: + see HPI  I reviewed pt's medications, allergies, PMH, social hx, family hx, and changes were documented in the history of present illness. Otherwise, unchanged from my initial visit note.  Past Medical History:  Diagnosis Date   AKI (acute kidney injury) ([place]) 01/2017   Allergy    seasonal   Anxiety    with panic attacks   Arthritis    "back; feet; hands; shoulders" (08/26/2014)   Asthma  Cataract    forming   Cervical cancer (HCC)    Chronic lower back pain    Chronic narcotic use    Chronic pain syndrome    [facility]   Cirrhosis ([place])    Clostridium difficile infection 2017   COPD (chronic obstructive pulmonary disease) (HCC)    Daily headache    past hx   Depression    Diabetic neuropathy ([place]) 06/04/2017   feet  and legs , some in hands   DJD (degenerative joint disease)    Fatty liver disease, nonalcoholic    Fibromyalgia    Frequency of urination    HCAP (healthcare-associated pneumonia) 08/26/2014   History of TIA (transient ischemic attack) 11/01/2010   NO RESIDUAL   Hyperlipidemia    Hypertension    Hypothyroidism    IDDM (insulin dependent diabetes mellitus)    Insomnia    Lumbar stenosis    Macular degeneration    Memory difficulty 07/25/2016   Nocturia    OSA (obstructive sleep apnea)    NO CPAP SINCE WT LOSS   Osteoarthritis    with severe disease in knee   Oxygen deficiency    4  liters at bedtime only   Pneumonia "several times"   Polymyalgia rheumatica ([place])    Scoliosis    Seasonal allergies    Stroke [facility])    TIA   Thyroid cancer ([place])    Urgency of urination    Vaginal pain S/P SLING  FEB 2012   Past Surgical History:  Procedure Laterality Date   APPENDECTOMY  1982   BREAST EXCISIONAL BIOPSY Left 02/28/2005   Atypical Ductal  Hyperplasia   CARDIAC CATHETERIZATION  09/04/2004   NORMAL CORONARY ANATOMY/ NORMAL LVF/ EF 60%   CARDIOVASCULAR STRESS TEST  12-27-2010  DR [place]   ABNORMAL NUCLEAR STUDY W/ /MILD INFERIOR ISCHEMIA/ EF 69%/  CT HEART ANGIOGRAM ;  NO ACUTE FINDINGS   COLONOSCOPY     CRYOABLATION  05/16/2003   w/LEEP FOR ABNORMAL PAP SMEAR   CYSTOSCOPY  05/18/2012   Procedure: CYSTOSCOPY;  Surgeon: Reece Packer, MD;  Location: [facility];  Service: Urology;  Laterality: N/A;  examination under anethesia   ESOPHAGOGASTRODUODENOSCOPY (EGD) WITH PROPOFOL N/A 09/03/2016   Procedure: ESOPHAGOGASTRODUODENOSCOPY (EGD) WITH PROPOFOL;  Surgeon: Doran Stabler, MD;  Location: [facility];  Service: Gastroenterology;  Laterality: N/A;   ESOPHAGOGASTRODUODENOSCOPY (EGD) WITH PROPOFOL N/A 11/19/2021   Procedure: ESOPHAGOGASTRODUODENOSCOPY (EGD) WITH PROPOFOL;  Surgeon: Doran Stabler, MD;  Location: [facility];  Service: Gastroenterology;  Laterality: N/A;  Varices screeing, cirrhosis   HYSTEROSCOPY WITH D & C  08/19/2007   PMB   KNEE ARTHROSCOPY W/ DEBRIDEMENT Left 03/29/2006   INTERNAL DERANGEMENT/ SEVERE DJD/ MENISCUS TEARS   LAPAROSCOPIC CHOLECYSTECTOMY  06/10/2002   LAPAROSCOPIC GASTRIC BANDING  03/01/2006   TRUNCAL VAGOTOMY/ PLACEMENT OF VG BAND   REVISION TOTAL KNEE ARTHROPLASTY Left 08-29-2008; 05/2009   TONSILLECTOMY  1969   TOTAL KNEE ARTHROPLASTY Left 01/23/2007   SEVERE DJD   TOTAL THYROIDECTOMY  11/22/2005   BILATERAL THYROID NODULES-- PAPILLARY CARCINOMA (0.5CM)/ ADENOMATOID NODULES   TRANSTHORACIC ECHOCARDIOGRAM  12/27/2010   LVSF NORMAL / EF XX123456 GRADE I DIASTOLIC DYSFUNCTION/ MILD MITRAL REGURG. / MILDLY DILATED LEFT ATRIUM/ MILDY INCREASED SYSTOLIC PRESSURE OF PULMONARY ARTERIES   TRANSVAGINAL SUBURETERAL TAPE/ SLING  09/28/2010   MIXED URINARY INCONTINENCE   TUBAL LIGATION  1983   Social History   Socioeconomic History   Marital status:  Married    Spouse  name: Not on file   Number of children: 2   Years of education: 24   Highest education level: Not on file  Occupational History   Occupation: disabled    Employer: UNEMPLOYED  Tobacco Use   Smoking status: Former    Packs/day: 2.50    Years: 39.00    Additional pack years: 0.00    Total pack years: 97.50    Types: Cigarettes    Quit date: 10/22/2010    Years since quitting: 12.0   Smokeless tobacco: Never  Vaping Use   Vaping Use: Never used  Substance and Sexual Activity   Alcohol use: No    Alcohol/week: 0.0 standard drinks of alcohol   Drug use: No   Sexual activity: Not Currently  Other Topics Concern   Not on file  Social History Narrative   Lives at home w/ her husband    Right-handed   Caffeine: 1 cup of coffee per week + [employer]   Social Determinants of Health   Financial Resource Strain: Medium Risk (06/06/2022)   Overall Financial Resource Strain (CARDIA)    Difficulty of Paying Living Expenses: Somewhat hard  Food Insecurity: No Food Insecurity (06/06/2022)   Hunger Vital Sign    Worried About Running Out of Food in the Last Year: Never true    [place] in the Last Year: Never true  Transportation Needs: No Transportation Needs (06/06/2022)   PRAPARE - Hydrologist (Medical): No    Lack of Transportation (Non-Medical): No  Physical Activity: Inactive (06/06/2022)   Exercise Vital Sign    Days of Exercise per Week: 0 days    Minutes of Exercise per Session: 0 min  Stress: No Stress Concern Present (06/06/2022)   [place]    Feeling of Stress : Only a little  Social Connections: Socially Integrated (06/06/2022)   Social Connection and Isolation Panel [NHANES]    Frequency of Communication with Friends and Family: More than three times a week    Frequency of Social Gatherings with Friends and Family: More than three times a week    Attends Religious  Services: More than 4 times per year    Active Member of [employer] or Organizations: Yes    Attends Music therapist: More than 4 times per year    Marital Status: Married  Human resources officer Violence: Not At Risk (06/06/2022)   Humiliation, Afraid, Rape, and Kick questionnaire    Fear of Current or Ex-Partner: No    Emotionally Abused: No    Physically Abused: No    Sexually Abused: No   Current Outpatient Medications on File Prior to Visit  Medication Sig Dispense Refill   Adalimumab (HUMIRA PEN) 40 MG/0.4ML PNKT Inject 40 mg into the skin every 14 (fourteen) days. Every other Thursday     albuterol (VENTOLIN HFA) 108 (90 Base) MCG/ACT inhaler TAKE 2 PUFFS BY MOUTH EVERY 6 HOURS AS NEEDED FOR WHEEZE OR SHORTNESS OF BREATH 18 g 11   Albuterol Sulfate (PROAIR RESPICLICK) 123XX123 (90 Base) MCG/ACT AEPB 1 puff as needed Inhalation every 4 hrs     amLODipine (NORVASC) 5 MG tablet TAKE ONE TABLET BY MOUTH AT BREAKFAST AND AT BEDTIME 60 tablet 0   buprenorphine (BUTRANS) 20 MCG/HR PTWK 1 patch once a week.     busPIRone (BUSPAR) 15 MG tablet Take 15 mg by mouth daily as needed (for  anxiety).     celecoxib (CELEBREX) 100 MG capsule Take 100 mg by mouth 2 (two) times daily.     cloNIDine (CATAPRES) 0.1 MG tablet Take 0.1 mg by mouth every 8 (eight) hours as needed.     colchicine 0.6 MG tablet Take one tab bid x 2 days and then daily 10 tablet 0   Continuous Blood Gluc Sensor (FREESTYLE LIBRE 3 SENSOR) MISC 1 each by Does not apply route every 14 (fourteen) days. 6 each 3   ergocalciferol (VITAMIN D2) 1.25 MG (50000 UT) capsule Take 50,000 Units by mouth once a week. Monday     fexofenadine (ALLEGRA) 180 MG tablet Take 180 mg by mouth daily as needed for allergies or rhinitis.     folic acid (FOLVITE) 1 MG tablet Take 1 mg by mouth daily.     furosemide (LASIX) 40 MG tablet TAKE ONE TABLET BY MOUTH in THE morning 30 tablet 5   gabapentin (NEURONTIN) 300 MG capsule Take 300 mg by mouth 3  (three) times daily as needed (pain).     hydrALAZINE (APRESOLINE) 25 MG tablet TAKE ONE TABLET BY MOUTH AT BREAKFAST AND AT BEDTIME May take additional tab if bp is over 170/80 90 tablet 2   hydrOXYzine (ATARAX) 50 MG tablet Take 50 mg by mouth at bedtime as needed.     hydrOXYzine (VISTARIL) 50 MG capsule Take 50 mg by mouth 2 (two) times daily as needed for itching or anxiety.     Insulin Glargine (BASAGLAR KWIKPEN) 100 UNIT/ML Inject 80 Units into the skin 2 (two) times daily. 150 mL 3   Insulin Pen Needle 32G X 4 MM MISC Use 5x a day 500 each 3   Insulin Syringe-Needle U-100 26G X 1/2" 1 ML MISC Use daily for insulin injection as directed 100 each 3   Ipratropium-Albuterol (COMBIVENT RESPIMAT) 20-100 MCG/ACT AERS respimat Inhale 1 puff into the lungs every 6 (six) hours as needed for wheezing. 4 g 5   ipratropium-albuterol (DUONEB) 0.5-2.5 (3) MG/3ML SOLN Take 3 mLs by nebulization every 6 (six) hours as needed (Wheezing or dyspnea.). 360 mL 11   levothyroxine (SYNTHROID) 150 MCG tablet TAKE ONE TABLET BY MOUTH BEFORE BREAKFAST 90 tablet 3   metFORMIN (GLUCOPHAGE) 500 MG tablet TAKE ONE TABLET BY MOUTH EVERY MORNING and TAKE ONE TABLET BY MOUTH EVERYDAY AT BEDTIME 180 tablet 3   metoprolol succinate (TOPROL-XL) 25 MG 24 hr tablet Take 1 tablet (25 mg total) by mouth at bedtime. Take with or immediately following a meal. 90 tablet 3   OXYGEN Inhale 3 L into the lungs at bedtime.     PARoxetine (PAXIL) 40 MG tablet Take 1 tablet (40 mg total) by mouth daily. 90 tablet 1   potassium chloride SA (KLOR-CON M20) 20 MEQ tablet TAKE 2 TABLETS EVERY DAY AS NEEDED WHEN YOU TAKE LASIX 180 tablet 1   promethazine (PHENERGAN) 25 MG tablet Take 0.5 tablets (12.5 mg total) by mouth every 8 (eight) hours as needed for nausea or vomiting. 30 tablet 0   Propylene Glycol (SYSTANE BALANCE) 0.6 % SOLN Place 1 drop into both eyes daily as needed (dry eyes).     Semaglutide, 1 MG/DOSE, (OZEMPIC, 1 MG/DOSE,) 4 MG/3ML  SOPN Inject 1 mg into the skin once a week. (Patient taking differently: Inject 1 mg into the skin every Monday.) 9 mL 3   simvastatin (ZOCOR) 20 MG tablet Take 1 tablet (20 mg total) by mouth daily at 6 PM. 90 tablet 2  tiZANidine (ZANAFLEX) 4 MG tablet Take 4 mg by mouth every 8 (eight) hours as needed for muscle spasms.     traZODone (DESYREL) 150 MG tablet Take 150 mg by mouth at bedtime as needed for sleep.     Insulin Lispro-aabc (LYUMJEV KWIKPEN) 200 UNIT/ML KwikPen Inject 20-25 Units into the skin 3 (three) times daily before meals. (Patient not taking: Reported on 11/21/2022) 45 mL 3   No current facility-administered medications on file prior to visit.   Allergies  Allergen Reactions   Gabapentin Swelling    Swelling in legs    Losartan Other (See Comments)    Myalgias and muscle cramping    Aricept [Donepezil Hcl]     Nausea/vomiting, low BP   Aripiprazole Other (See Comments)    Patient reports Hypoglycemia   Hydroxyzine     hallucinations   Oxycodone-Acetaminophen Itching   Oxycodone Itching   Sulfa Antibiotics Nausea Only and Rash   Sulfonamide Derivatives Nausea Only and Rash   Family History  Problem Relation Age of Onset   Colon polyps Mother    Diabetes Mother    Heart disease Mother    Dementia Mother    Heart disease Father    High blood pressure Father    Colon cancer Maternal Uncle        x 3   Stomach cancer Paternal Uncle    Breast cancer Other        great aunts x 5   Esophageal cancer Neg Hx    Rectal cancer Neg Hx    PE: There were no vitals taken for this visit. Wt Readings from Last 3 Encounters:  10/15/22 (!) 303 lb 9.6 oz (137.7 kg)  08/08/22 (!) 310 lb (140.6 kg)  07/24/22 (!) 307 lb (139.3 kg)   Constitutional:  in NAD The physical exam was not performed (virtual visit).  ASSESSMENT: 1. DM2, insulin-dependent, uncontrolled, with complications  2.  History of thyroid cancer  3.  Postsurgical hypothyroidism  PLAN:  1. Patient  with longstanding, uncontrolled, type 2 diabetes, on metformin, long-acting insulin, GLP-1 receptor agonist and ultra rapid acting insulin, increased at last visit.  Her diabetes control worsened significantly after starting prednisone for rheumatoid arthritis.  At last visit, she did improve her diet and was also able to come off the steroids for 2 months.  However, she was occasionally forgetting Basaglar and sugars were still high.  They did appear to be improving, though.  We increased her Lyumjev at that time, advised her to set alarms on her phone to take Basaglar, and I also suggested a CGM. -At today's visit, patient is remain very high, at all times of the day, with improvement in the morning but still very high blood sugars in the evening.  She is already taking approximately 200 units or more of insulin a day so at today's visit I suggest to switch to U-500 insulin.  We will need to get this from the patient assistance if she cannot obtain it from the pharmacy.  For now, I sent the prescription to [facility] per her request.  I also refilled her Ozempic.  Will continue the Ozempic and metformin at the current doses. - I suggested to:  Patient Instructions  Please continue: - Metformin 500 mg 2x a day - Ozempic 1 mg weekly in a.m.   Stop Basaglar and Lyumjev and start U500 - 30 min before the meal: - 80-100 units before b'fast - 30-40 units before lunch - 50-60 units before  dinner  Please continue Levothyroxine 150 mcg daily.  Take the thyroid hormone every day, with water, at least 30 minutes before breakfast, separated by at least 4 hours from: - acid reflux medications - calcium - iron - multivitamins  Please return in 1.5 months.      - we will check a HbA1c when she returns to the clinic - advised to check sugars at different times of the day - 4x a day, rotating check times - advised for yearly eye exams >> she is UTD - return to clinic in 1.5 months  2.  History of thyroid  cancer -She had a papillary thyroid cancer focus in the right thyroid lobe, measuring 0.5 cm.  No vascular, lymphatic or direct extension into the surrounding tissues -No neck compression symptoms -the neck ultrasound obtained in 10/2020 showed no suspicious masses -He is most likely cured  3.  Postsurgical hypothyroidism - latest thyroid labs reviewed with pt. >> normal: Lab Results  Component Value Date   TSH 4.62 05/10/2022  - she continues on LT4 150 mcg daily - pt feels good on this dose. - we discussed about taking the thyroid hormone every day, with water, >30 minutes before breakfast, separated by >4 hours from acid reflux medications, calcium, iron, multivitamins. Pt. is taking it correctly. - will check thyroid tests when she returns to the clinic  Philemon Kingdom, MD PhD [facility]

## 2022-11-21 NOTE — Patient Instructions (Addendum)
Please continue: - Metformin 500 mg 2x a day - Ozempic 1 mg weekly in a.m.   Stop Basaglar and Lyumjev and start U500 - 30 min before the meal: - 80-100 units before b'fast - 30-40 units before lunch - 50-60 units before dinner  Please continue Levothyroxine 150 mcg daily.  Take the thyroid hormone every day, with water, at least 30 minutes before breakfast, separated by at least 4 hours from: - acid reflux medications - calcium - iron - multivitamins  Please return in 1.5 months.

## 2022-11-22 DIAGNOSIS — F332 Major depressive disorder, recurrent severe without psychotic features: Secondary | ICD-10-CM | POA: Diagnosis not present

## 2022-11-22 DIAGNOSIS — F4323 Adjustment disorder with mixed anxiety and depressed mood: Secondary | ICD-10-CM | POA: Diagnosis not present

## 2022-11-22 DIAGNOSIS — F418 Other specified anxiety disorders: Secondary | ICD-10-CM | POA: Diagnosis not present

## 2022-11-22 DIAGNOSIS — F1911 Other psychoactive substance abuse, in remission: Secondary | ICD-10-CM | POA: Diagnosis not present

## 2022-11-22 DIAGNOSIS — R69 Illness, unspecified: Secondary | ICD-10-CM | POA: Diagnosis not present

## 2022-11-27 ENCOUNTER — Encounter: Payer: Self-pay | Admitting: Internal Medicine

## 2022-12-11 DIAGNOSIS — J449 Chronic obstructive pulmonary disease, unspecified: Secondary | ICD-10-CM | POA: Diagnosis not present

## 2022-12-13 ENCOUNTER — Other Ambulatory Visit: Payer: Self-pay | Admitting: Internal Medicine

## 2022-12-13 ENCOUNTER — Other Ambulatory Visit: Payer: Self-pay | Admitting: Adult Health

## 2022-12-13 DIAGNOSIS — I10 Essential (primary) hypertension: Secondary | ICD-10-CM

## 2022-12-13 DIAGNOSIS — G4733 Obstructive sleep apnea (adult) (pediatric): Secondary | ICD-10-CM

## 2022-12-14 NOTE — Progress Notes (Deleted)
Cardiology Office Note:    Date:  12/14/2022   ID:  Mary Acosta, DOB 09-24-1955, MRN [id number]  PCP:  Pincus Sanes, MD  Cardiologist:  Olga Millers, MD  Electrophysiologist:  None   Referring MD: Pincus Sanes, MD   Chief Complaint: follow-up of dyspnea on exertion and labile BP  History of Present Illness:    Mary Acosta is a 67 y.o. female with a history of normal coronaries on cardiac catheterization in 2006 and coronary calcium score in 12/2010, chronic diastolic CHF, paroxysmal SVT, prior stroke, COPD with pulmonary hypertension, hypertension, hyperlipidemia, type 2 diabetes mellitus, hypothyroidism, obstructive sleep apnea unable to tolerate CPAP, cirrhosis, fibromyalgia, polymyalgia rheumatica, and chronic pain syndrome on narcotics who is followed by Dr. Jens Som and presents today for dyspnea on exertion and labile BP.   Remote cardiac catheterization in 2006 showed normal coronaries. Coronary CTA in 12/2010 showed a coronary calcium score of 0 with no plaque or stenosis noted in the coronary tree but the distal LAD and distal PDA were not well visualized. Event Monitor in 12/2017 showed no significant arrhythmias. Last Echo in 06/2019 showed LVEF of 60-65% with grade 1 diastolic dysfunction, normal RV, and no significant valvular disease.  She was last seen by me in 09/2022 at which time she reported worsening dyspnea over the last 5-6 months with minial activity such as walking from her bedroom to her living room. She denied any chest pain, orthopnea, PND, or edema. She also reported very labile BP with associated lightheadedness/dizziness if BP is low. She reported that she typically does not sleep well at night due to frequent urination and when she does get a good night of sleep, her "BP will be so high she can barely open her eyes." She said her BP had been as high as 250/130 in the past after a good night of sleep.  She was prescribed Amlodipine  twice  daily , Lopressor  twice daily, Hydralazine 25 twice daily. However, she reported only taking these in the evenings and then would take morning dose of Lopressor and Hydralazine in the morning if BP markedly elevated and BP will gradually come down to the 130s/80s.  Deconditioning and morbid obesity were felt to be contributing to dyspnea. However, BNP was ordered and was normal. Echo was also ordered and showed LVEF of >75% with normal wall motion, mild LVH, and no significant valvular disease. Lopressor was switched to Toprol-XL (given she was usually only taking the evening dose) and she was advised to keep a BP/HR log for 2 weeks. Untreated sleep apnea was felt to likely be contributing to elevated BP but patient has been unable to tolerate CPAP in the past.    Patient presents today for follow-up. ***   Dyspnea on Exertion Patient reports worsening dyspnea on exertion over the past several month at last visit in 09/2022. BNP was normal. Echo showed Echo was also ordered and showed LVEF of >75% with normal wall motion, mild LVH, and no significant valvular disease.  - *** - Suspect dyspnea is at least partially due to deconditioning and morbid obesity. Ischemic evaluation ***   Chronic Diastolic CHF Echo in 10/2022 showed  LVEF of >75% with normal wall motion, mild LVH, and no significant valvular disease. - Euvolemic on exam. *** - Continue Lasix  daily as needed for edema.    Paroxysmal SVT Monitor in 12/2017 showed normal sinus rhythm to sinus tachycardia but no significant arrhythmia.  -  She reports occasional brief palpitations but overall stable. *** - Continue Toprol-XL 25mg  daily.   Labile Hypertension Patient has a history of labile BP. *** - Continue current medications: Amlodipine 5mg  twice daily ,Toprol-XL 25mg  daily, and Hydralazine 25 twice daily. She states she usually only take Hydralazine at night but will take Hydralazine in the morning if her BP is markedly  elevated. OK to continue to do this.  - ***   Hyperlipidemia Lipid panel in 09/2021: Total Cholesterol 183, Triglycerides 127, HDL 52.90, LDL 104.  - Continue Simvastatin 20mg  daily.  - ***   Type 2 Diabetes Mellitus Hemoglobin A1c in 11.9 in 04/2022. - On Metformin, Semaglutide, and Insulin. - Management per Endocrinology.   Past Medical History:  Diagnosis Date   AKI (acute kidney injury) (HCC) 01/2017   Allergy    seasonal   Anxiety    with panic attacks   Arthritis    "back; feet; hands; shoulders" (08/26/2014)   Asthma    Cataract    forming   Cervical cancer (HCC)    Chronic lower back pain    Chronic narcotic use    Chronic pain syndrome    [facility]   Cirrhosis (HCC)    Clostridium difficile infection 2017   COPD (chronic obstructive pulmonary disease) (HCC)    Daily headache    past hx   Depression    Diabetic neuropathy (HCC) 06/04/2017   feet  and legs , some in hands   DJD (degenerative joint disease)    Fatty liver disease, nonalcoholic    Fibromyalgia    Frequency of urination    HCAP (healthcare-associated pneumonia) 08/26/2014   History of TIA (transient ischemic attack) 11/01/2010   NO RESIDUAL   Hyperlipidemia    Hypertension    Hypothyroidism    IDDM (insulin dependent diabetes mellitus)    Insomnia    Lumbar stenosis    Macular degeneration    Memory difficulty 07/25/2016   Nocturia    OSA (obstructive sleep apnea)    NO CPAP SINCE WT LOSS   Osteoarthritis    with severe disease in knee   Oxygen deficiency    4  liters at bedtime only   Pneumonia "several times"   Polymyalgia rheumatica (HCC)    Scoliosis    Seasonal allergies    Stroke [facility])    TIA   Thyroid cancer (HCC)    Urgency of urination    Vaginal pain S/P SLING  FEB 2012    Past Surgical History:  Procedure Laterality Date   APPENDECTOMY  1982   BREAST EXCISIONAL BIOPSY Left 02/28/2005   Atypical Ductal Hyperplasia   CARDIAC CATHETERIZATION   09/04/2004   NORMAL CORONARY ANATOMY/ NORMAL LVF/ EF 60%   CARDIOVASCULAR STRESS TEST  12-27-2010  DR [place]   ABNORMAL NUCLEAR STUDY W/ /MILD INFERIOR ISCHEMIA/ EF 69%/  CT HEART ANGIOGRAM ;  NO ACUTE FINDINGS   COLONOSCOPY     CRYOABLATION  05/16/2003   w/LEEP FOR ABNORMAL PAP SMEAR   CYSTOSCOPY  05/18/2012   Procedure: CYSTOSCOPY;  Surgeon: Martina Sinner, MD;  Location[facility];  Service: Urology;  Laterality: N/A;  examination under anethesia   ESOPHAGOGASTRODUODENOSCOPY (EGD) WITH PROPOFOL N/A 09/03/2016   Procedure: ESOPHAGOGASTRODUODENOSCOPY (EGD) WITH PROPOFOL;  Surgeon: Sherrilyn Rist, MD;  Location: [facility];  Service: Gastroenterology;  Laterality: N/A;   ESOPHAGOGASTRODUODENOSCOPY (EGD) WITH PROPOFOL N/A 11/19/2021   Procedure: ESOPHAGOGASTRODUODENOSCOPY (EGD) WITH PROPOFOL;  Surgeon: Myrtie Neither,  Andreas Blower, MD;  Location: [facility];  Service: Gastroenterology;  Laterality: N/A;  Varices screeing, cirrhosis   HYSTEROSCOPY WITH D & C  08/19/2007   PMB   KNEE ARTHROSCOPY W/ DEBRIDEMENT Left 03/29/2006   INTERNAL DERANGEMENT/ SEVERE DJD/ MENISCUS TEARS   LAPAROSCOPIC CHOLECYSTECTOMY  06/10/2002   LAPAROSCOPIC GASTRIC BANDING  03/01/2006   TRUNCAL VAGOTOMY/ PLACEMENT OF VG BAND   REVISION TOTAL KNEE ARTHROPLASTY Left 08-29-2008; 05/2009   TONSILLECTOMY  1969   TOTAL KNEE ARTHROPLASTY Left 01/23/2007   SEVERE DJD   TOTAL THYROIDECTOMY  11/22/2005   BILATERAL THYROID NODULES-- PAPILLARY CARCINOMA (0.5CM)/ ADENOMATOID NODULES   TRANSTHORACIC ECHOCARDIOGRAM  12/27/2010   LVSF NORMAL / EF 55-65%/ GRADE I DIASTOLIC DYSFUNCTION/ MILD MITRAL REGURG. / MILDLY DILATED LEFT ATRIUM/ MILDY INCREASED SYSTOLIC PRESSURE OF PULMONARY ARTERIES   TRANSVAGINAL SUBURETERAL TAPE/ SLING  09/28/2010   MIXED URINARY INCONTINENCE   TUBAL LIGATION  1983    Current Medications: No outpatient medications have been marked as taking for the 12/16/22 encounter (Appointment) with  Corrin Parker, PA-C.     Allergies:   Gabapentin, Losartan, Aricept [donepezil hcl], Aripiprazole, Hydroxyzine, Oxycodone-acetaminophen, Oxycodone, Sulfa antibiotics, and Sulfonamide derivatives   Social History   Socioeconomic History   Marital status: Married    Spouse name: Not on file   Number of children: 2   Years of education: 12   Highest education level: Not on file  Occupational History   Occupation: disabled    Employer: UNEMPLOYED  Tobacco Use   Smoking status: Former    Packs/day: 2.50    Years: 39.00    Additional pack years: 0.00    Total pack years: 97.50    Types: Cigarettes    Quit date: 10/22/2010    Years since quitting: 12.1   Smokeless tobacco: Never  Vaping Use   Vaping Use: Never used  Substance and Sexual Activity   Alcohol use: No    Alcohol/week: 0.0 standard drinks of alcohol   Drug use: No   Sexual activity: Not Currently  Other Topics Concern   Not on file  Social History Narrative   Lives at home w/ her husband    Right-handed   Caffeine: 1 cup of coffee per week + [employer]   Social Determinants of Health   Financial Resource Strain: Medium Risk (06/06/2022)   Overall Financial Resource Strain (CARDIA)    Difficulty of Paying Living Expenses: Somewhat hard  Food Insecurity: No Food Insecurity (06/06/2022)   Hunger Vital Sign    Worried About Running Out of Food in the Last Year: Never true    Ran Out of Food in the Last Year: Never true  Transportation Needs: No Transportation Needs (06/06/2022)   PRAPARE - Administrator, Civil Service (Medical): No    Lack of Transportation (Non-Medical): No  Physical Activity: Inactive (06/06/2022)   Exercise Vital Sign    Days of Exercise per Week: 0 days    Minutes of Exercise per Session: 0 min  Stress: No Stress Concern Present (06/06/2022)   [employer] of Occupational Health - Occupational Stress Questionnaire    Feeling of Stress : Only a little  Social Connections:  Socially Integrated (06/06/2022)   Social Connection and Isolation Panel [NHANES]    Frequency of Communication with Friends and Family: More than three times a week    Frequency of Social Gatherings with Friends and Family: More than three times a week    Attends Religious Services: More than  4 times per year    Active Member of Clubs or Organizations: Yes    Attends Engineer, structural: More than 4 times per year    Marital Status: Married     Family History: The patient's family history includes Breast cancer in an other family member; Colon cancer in her maternal uncle; Colon polyps in her mother; Dementia in her mother; Diabetes in her mother; Heart disease in her father and mother; High blood pressure in her father; Stomach cancer in her paternal uncle. There is no history of Esophageal cancer or Rectal cancer.  ROS:   Please see the history of present illness.     EKGs/Labs/Other Studies Reviewed:    The following studies were reviewed:  Coronary CTA 01/16/2011: Impression: 1. Coronary calcium score = 0, placing patient in a low risk group.  2.  No plaque or stenosis noted in the coronary tree.  The distal  LAD and distal PDA were not well-visualized.  3.  Noncardiac findings to be reported by [facility].  _______________   Event Monitor 12/29/2017 to 01/27/2018 Sinus to sinus tachycardia. No significant arrhythmias.  _______________  Echocardiogram 11/14/2022: Impressions: 1. Left ventricular ejection fraction, by estimation, is >75%. The left  ventricle has hyperdynamic function. The left ventricle has no regional  wall motion abnormalities. There is mild left ventricular hypertrophy.  Left ventricular diastolic parameters  are indeterminate.   2. Right ventricular systolic function is normal. The right ventricular  size is normal.   3. The mitral valve is normal in structure. No evidence of mitral valve  regurgitation. No evidence of mitral stenosis.    4. The aortic valve has an indeterminant number of cusps. Aortic valve  regurgitation is not visualized. No aortic stenosis is present.    EKG:  EKG not ordered today.   Recent Labs: 05/10/2022: TSH 4.62 07/12/2022: ALT 13; Pro B Natriuretic peptide (BNP) 94.0 10/15/2022: BNP 37.7; BUN 17; Creatinine, Ser 1.30; Hemoglobin 13.4; Platelets 203; Potassium 5.1; Sodium 137  Recent Lipid Panel    Component Value Date/Time   CHOL 183 10/09/2021 1534   CHOL 193 02/17/2020 1116   TRIG 127.0 10/09/2021 1534   HDL 52.90 10/09/2021 1534   HDL 48 02/17/2020 1116   CHOLHDL 3 10/09/2021 1534   VLDL 25.4 10/09/2021 1534   LDLCALC 104 (H) 10/09/2021 1534   LDLCALC 55 05/03/2020 1517    Physical Exam:    Vital Signs: There were no vitals taken for this visit.    Wt Readings from Last 3 Encounters:  10/15/22 (!) 303 lb 9.6 oz (137.7 kg)  08/08/22 (!) 310 lb (140.6 kg)  07/24/22 (!) 307 lb (139.3 kg)     General: 68 y.o. female in no acute distress. HEENT: Normocephalic and atraumatic. Sclera clear. EOMs intact. Neck: Supple. No carotid bruits. No JVD. Heart: *** RRR. Distinct S1 and S2. No murmurs, gallops, or rubs. Radial and distal pedal pulses 2+ and equal bilaterally. Lungs: No increased work of breathing. Clear to ausculation bilaterally. No wheezes, rhonchi, or rales.  Abdomen: Soft, non-distended, and non-tender to palpation. Bowel sounds present in all 4 quadrants.  MSK: Normal strength and tone for age. *** Extremities: No lower extremity edema.    Skin: Warm and dry. Neuro: Alert and oriented x3. No focal deficits. Psych: Normal affect. Responds appropriately.   Assessment:    No diagnosis found.  Plan:     Disposition: Follow up in ***   Medication Adjustments/Labs and Tests Ordered: Current medicines  are reviewed at length with the patient today.  Concerns regarding medicines are outlined above.  No orders of the defined types were placed in this encounter.  No  orders of the defined types were placed in this encounter.   There are no Patient Instructions on file for this visit.   Leanne Lovely, PA-C  12/14/2022 11:08 PM    [facility]

## 2022-12-14 NOTE — Progress Notes (Incomplete)
Cardiology Office Note:    Date:  12/14/2022   ID:  Mary Acosta, DOB November 28, 1955, MRN [id number]  PCP:  Pincus Sanes, MD  Cardiologist:  Olga Millers, MD  Electrophysiologist:  None   Referring MD: Pincus Sanes, MD   Chief Complaint: ***  History of Present Illness:    Mary Acosta is a 67 y.o. female with a history of normal coronaries on cardiac catheterization in 2006 and coronary calcium score in 12/2010, chronic diastolic CHF, paroxysmal SVT, prior stroke, COPD with pulmonary hypertension, hypertension, hyperlipidemia, type 2 diabetes mellitus, hypothyroidism, cirrhosis, fibromyalgia, polymyalgia rheumatica, and chronic pain syndrome on narcotics who is followed by Dr. Jens Som and presents today for ***   Remote cardiac catheterization in 2006 showed normal coronaries. Coronary CTA in 12/2010 showed a coronary calcium score of 0 with no plaque or stenosis noted in the coronary tree but the distal LAD and distal PDA were not well visualized. Event Monitor in 12/2017 showed no significant arrhythmias. Last Echo in 06/2019 showed LVEF of 60-65% with grade 1 diastolic dysfunction, normal RV, and no significant valvular disease.  She was last seen by me in 09/2022 at which time she reported worsening dyspnea over the last 5-6 months with minial activity such as walking from her bedroom to her living room. She denied any chest pain, orthopnea, PND, or edema. She also reported very labile BP with associated lightheadedness/. She reported that she typically does not sleep well at night due to frequent urination and when she does get a good night of sleep, her "BP will be so high she can barely open her eyes." She was prescribed Amlodipine  twice daily , Lopressor  twice daily, Hydralazine 25 twice daily. However, she states she usually only takes these in the evenings. She will take morning doses if BP markedly elevated and BP will gradually come down to the 130s/80s.   Deconditioning and morbid obesity were felt to be contributing to dyspnea. However, BNP was ordered and was normal. Echo was also ordered and showed LVEF of >75% with normal wall motion, mild LVH, and no significant valvular disease.     Past Medical History:  Diagnosis Date  . AKI (acute kidney injury) (HCC) 01/2017  . Allergy    seasonal  . Anxiety    with panic attacks  . Arthritis    "back; feet; hands; shoulders" (08/26/2014)  . Asthma   . Cataract    forming  . Cervical cancer (HCC)   . Chronic lower back pain   . Chronic narcotic use   . Chronic pain syndrome    [facility]  . Cirrhosis (HCC)   . Clostridium difficile infection 2017  . COPD (chronic obstructive pulmonary disease) (HCC)   . Daily headache    past hx  . Depression   . Diabetic neuropathy (HCC) 06/04/2017   feet  and legs , some in hands  . DJD (degenerative joint disease)   . Fatty liver disease, nonalcoholic   . Fibromyalgia   . Frequency of urination   . HCAP (healthcare-associated pneumonia) 08/26/2014  . History of TIA (transient ischemic attack) 11/01/2010   NO RESIDUAL  . Hyperlipidemia   . Hypertension   . Hypothyroidism   . IDDM (insulin dependent diabetes mellitus)   . Insomnia   . Lumbar stenosis   . Macular degeneration   . Memory difficulty 07/25/2016  . Nocturia   . OSA (obstructive sleep apnea)  NO CPAP SINCE WT LOSS  . Osteoarthritis    with severe disease in knee  . Oxygen deficiency    4  liters at bedtime only  . Pneumonia "several times"  . Polymyalgia rheumatica (HCC)   . Scoliosis   . Seasonal allergies   . Stroke [facility])    TIA  . Thyroid cancer (HCC)   . Urgency of urination   . Vaginal pain S/P SLING  FEB 2012    Past Surgical History:  Procedure Laterality Date  . APPENDECTOMY  1982  . BREAST EXCISIONAL BIOPSY Left 02/28/2005   Atypical Ductal Hyperplasia  . CARDIAC CATHETERIZATION  09/04/2004   NORMAL CORONARY ANATOMY/ NORMAL LVF/ EF 60%   . CARDIOVASCULAR STRESS TEST  12-27-2010  DR [place]   ABNORMAL NUCLEAR STUDY W/ /MILD INFERIOR ISCHEMIA/ EF 69%/  CT HEART ANGIOGRAM ;  NO ACUTE FINDINGS  . COLONOSCOPY    . CRYOABLATION  05/16/2003   w/LEEP FOR ABNORMAL PAP SMEAR  . CYSTOSCOPY  05/18/2012   Procedure: CYSTOSCOPY;  Surgeon: Martina Sinner, MD;  Location: [facility];  Service: Urology;  Laterality: N/A;  examination under anethesia  . ESOPHAGOGASTRODUODENOSCOPY (EGD) WITH PROPOFOL N/A 09/03/2016   Procedure: ESOPHAGOGASTRODUODENOSCOPY (EGD) WITH PROPOFOL;  Surgeon: Sherrilyn Rist, MD;  Location: [facility];  Service: Gastroenterology;  Laterality: N/A;  . ESOPHAGOGASTRODUODENOSCOPY (EGD) WITH PROPOFOL N/A 11/19/2021   Procedure: ESOPHAGOGASTRODUODENOSCOPY (EGD) WITH PROPOFOL;  Surgeon: Sherrilyn Rist, MD;  Location: [facility];  Service: Gastroenterology;  Laterality: N/A;  Varices screeing, cirrhosis  . HYSTEROSCOPY WITH D & C  08/19/2007   PMB  . KNEE ARTHROSCOPY W/ DEBRIDEMENT Left 03/29/2006   INTERNAL DERANGEMENT/ SEVERE DJD/ MENISCUS TEARS  . LAPAROSCOPIC CHOLECYSTECTOMY  06/10/2002  . LAPAROSCOPIC GASTRIC BANDING  03/01/2006   TRUNCAL VAGOTOMY/ PLACEMENT OF VG BAND  . REVISION TOTAL KNEE ARTHROPLASTY Left 08-29-2008; 05/2009  . TONSILLECTOMY  1969  . TOTAL KNEE ARTHROPLASTY Left 01/23/2007   SEVERE DJD  . TOTAL THYROIDECTOMY  11/22/2005   BILATERAL THYROID NODULES-- PAPILLARY CARCINOMA (0.5CM)/ ADENOMATOID NODULES  . TRANSTHORACIC ECHOCARDIOGRAM  12/27/2010   LVSF NORMAL / EF 55-65%/ GRADE I DIASTOLIC DYSFUNCTION/ MILD MITRAL REGURG. / MILDLY DILATED LEFT ATRIUM/ MILDY INCREASED SYSTOLIC PRESSURE OF PULMONARY ARTERIES  . TRANSVAGINAL SUBURETERAL TAPE/ SLING  09/28/2010   MIXED URINARY INCONTINENCE  . TUBAL LIGATION  1983    Current Medications: No outpatient medications have been marked as taking for the 12/16/22 encounter (Appointment) with Corrin Parker, PA-C.      Allergies:   Gabapentin, Losartan, Aricept [donepezil hcl], Aripiprazole, Hydroxyzine, Oxycodone-acetaminophen, Oxycodone, Sulfa antibiotics, and Sulfonamide derivatives   Social History   Socioeconomic History  . Marital status: Married    Spouse name: Not on file  . Number of children: 2  . Years of education: 76  . Highest education level: Not on file  Occupational History  . Occupation: disabled    Associate Professor: UNEMPLOYED  Tobacco Use  . Smoking status: Former    Packs/day: 2.50    Years: 39.00    Additional pack years: 0.00    Total pack years: 97.50    Types: Cigarettes    Quit date: 10/22/2010    Years since quitting: 12.1  . Smokeless tobacco: Never  Vaping Use  . Vaping Use: Never used  Substance and Sexual Activity  . Alcohol use: No    Alcohol/week: 0.0 standard drinks of alcohol  . Drug use: No  . Sexual activity: Not Currently  Other Topics Concern  . Not on file  Social History Narrative   Lives at home w/ her husband    Right-handed   Caffeine: 1 cup of coffee per week + [employer]   Social Determinants of Health   Financial Resource Strain: Medium Risk (06/06/2022)   Overall Financial Resource Strain (CARDIA)   . Difficulty of Paying Living Expenses: Somewhat hard  Food Insecurity: No Food Insecurity (06/06/2022)   Hunger Vital Sign   . Worried About Programme researcher, broadcasting/film/video in the Last Year: Never true   . Ran Out of Food in the Last Year: Never true  Transportation Needs: No Transportation Needs (06/06/2022)   PRAPARE - Transportation   . Lack of Transportation (Medical): No   . Lack of Transportation (Non-Medical): No  Physical Activity: Inactive (06/06/2022)   Exercise Vital Sign   . Days of Exercise per Week: 0 days   . Minutes of Exercise per Session: 0 min  Stress: No Stress Concern Present (06/06/2022)   [employer] of Occupational Health - Occupational Stress Questionnaire   . Feeling of Stress : Only a little  Social Connections: Socially  Integrated (06/06/2022)   Social Connection and Isolation Panel [NHANES]   . Frequency of Communication with Friends and Family: More than three times a week   . Frequency of Social Gatherings with Friends and Family: More than three times a week   . Attends Religious Services: More than 4 times per year   . Active Member of Clubs or Organizations: Yes   . Attends Banker Meetings: More than 4 times per year   . Marital Status: Married     Family History: The patient's ***family history includes Breast cancer in an other family member; Colon cancer in her maternal uncle; Colon polyps in her mother; Dementia in her mother; Diabetes in her mother; Heart disease in her father and mother; High blood pressure in her father; Stomach cancer in her paternal uncle. There is no history of Esophageal cancer or Rectal cancer.  ROS:   Please see the history of present illness.    *** All other systems reviewed and are negative.  EKGs/Labs/Other Studies Reviewed:    The following studies were reviewed today: ***  EKG:  EKG *** ordered today. EKG personally reviewed and demonstrates ***.  Recent Labs: 05/10/2022: TSH 4.62 07/12/2022: ALT 13; Pro B Natriuretic peptide (BNP) 94.0 10/15/2022: BNP 37.7; BUN 17; Creatinine, Ser 1.30; Hemoglobin 13.4; Platelets 203; Potassium 5.1; Sodium 137  Recent Lipid Panel    Component Value Date/Time   CHOL 183 10/09/2021 1534   CHOL 193 02/17/2020 1116   TRIG 127.0 10/09/2021 1534   HDL 52.90 10/09/2021 1534   HDL 48 02/17/2020 1116   CHOLHDL 3 10/09/2021 1534   VLDL 25.4 10/09/2021 1534   LDLCALC 104 (H) 10/09/2021 1534   LDLCALC 55 05/03/2020 1517    Physical Exam:    Vital Signs: There were no vitals taken for this visit.    Wt Readings from Last 3 Encounters:  10/15/22 (!) 303 lb 9.6 oz (137.7 kg)  08/08/22 (!) 310 lb (140.6 kg)  07/24/22 (!) 307 lb (139.3 kg)     General: 67 y.o. female in no acute distress. HEENT: Normocephalic  and atraumatic. Sclera clear. EOMs intact. Neck: Supple. No carotid bruits. No JVD. Heart: *** RRR. Distinct S1 and S2. No murmurs, gallops, or rubs. Radial and distal pedal pulses 2+ and equal bilaterally. Lungs: No increased work of breathing. Clear to  ausculation bilaterally. No wheezes, rhonchi, or rales.  Abdomen: Soft, non-distended, and non-tender to palpation. Bowel sounds present in all 4 quadrants.  MSK: Normal strength and tone for age. *** Extremities: No lower extremity edema.    Skin: Warm and dry. Neuro: Alert and oriented x3. No focal deficits. Psych: Normal affect. Responds appropriately.   Assessment:    No diagnosis found.  Plan:     Disposition: Follow up in ***   Medication Adjustments/Labs and Tests Ordered: Current medicines are reviewed at length with the patient today.  Concerns regarding medicines are outlined above.  No orders of the defined types were placed in this encounter.  No orders of the defined types were placed in this encounter.   There are no Patient Instructions on file for this visit.   Leanne Lovely, PA-C  12/14/2022 11:08 PM    [facility]

## 2022-12-16 ENCOUNTER — Ambulatory Visit: Payer: Medicare HMO | Admitting: Student

## 2022-12-18 ENCOUNTER — Telehealth: Payer: Self-pay | Admitting: Internal Medicine

## 2022-12-18 MED ORDER — COLCHICINE 0.6 MG PO TABS: ORAL_TABLET | ORAL | 0 refills | Status: DC

## 2022-12-18 NOTE — Telephone Encounter (Signed)
Prescription Request  12/18/2022  LOV: 07/24/2022  What is the name of the medication or equipment? colchicine  Have you contacted your pharmacy to request a refill? No   Which pharmacy would you like this sent to?   [id number] - [place] [place] - [address] AT [facility] [address] [place] [address] Phone: [phone] Fax: [phone]    Patient notified that their request is being sent to the clinical staff for review and that they should receive a response within 2 business days.   Please advise at Mobile [phone] (mobile)

## 2022-12-18 NOTE — Telephone Encounter (Signed)
[facility].../lmb 

## 2022-12-24 ENCOUNTER — Encounter: Payer: Self-pay | Admitting: Internal Medicine

## 2022-12-24 ENCOUNTER — Ambulatory Visit: Payer: Medicare HMO | Admitting: Internal Medicine

## 2022-12-24 VITALS — BP 136/74 | HR 91 | Ht 69.0 in | Wt 302.4 lb

## 2022-12-24 DIAGNOSIS — Z85850 Personal history of malignant neoplasm of thyroid: Secondary | ICD-10-CM | POA: Diagnosis not present

## 2022-12-24 DIAGNOSIS — Z7985 Long-term (current) use of injectable non-insulin antidiabetic drugs: Secondary | ICD-10-CM | POA: Diagnosis not present

## 2022-12-24 DIAGNOSIS — Z794 Long term (current) use of insulin: Secondary | ICD-10-CM

## 2022-12-24 DIAGNOSIS — E1159 Type 2 diabetes mellitus with other circulatory complications: Secondary | ICD-10-CM | POA: Diagnosis not present

## 2022-12-24 DIAGNOSIS — E039 Hypothyroidism, unspecified: Secondary | ICD-10-CM

## 2022-12-24 DIAGNOSIS — Z7984 Long term (current) use of oral hypoglycemic drugs: Secondary | ICD-10-CM | POA: Diagnosis not present

## 2022-12-24 DIAGNOSIS — E890 Postprocedural hypothyroidism: Secondary | ICD-10-CM

## 2022-12-24 DIAGNOSIS — E782 Mixed hyperlipidemia: Secondary | ICD-10-CM | POA: Diagnosis not present

## 2022-12-24 DIAGNOSIS — E1165 Type 2 diabetes mellitus with hyperglycemia: Secondary | ICD-10-CM | POA: Diagnosis not present

## 2022-12-24 LAB — LIPID PANEL
Cholesterol: 127 mg/dL (ref 0–200)
HDL: 33.9 mg/dL — ABNORMAL LOW (ref >39.00)
LDL Cholesterol: 66 mg/dL (ref 0–99)
NonHDL: 93.09
Total CHOL/HDL Ratio: 4
Triglycerides: 135 mg/dL (ref 0.0–149.0)
VLDL: 27 mg/dL (ref 0.0–40.0)

## 2022-12-24 LAB — POCT GLYCOSYLATED HEMOGLOBIN (HGB A1C): Hemoglobin A1C: 12.1 % — AB (ref 4.0–5.6)

## 2022-12-24 LAB — TSH: TSH: 10.66 u[IU]/mL — ABNORMAL HIGH (ref 0.35–5.50)

## 2022-12-24 LAB — T4, FREE: Free T4: 1.06 ng/dL (ref 0.60–1.60)

## 2022-12-24 MED ORDER — LEVOTHYROXINE SODIUM 175 MCG PO TABS: ORAL_TABLET | ORAL | 3 refills | Status: DC

## 2022-12-24 NOTE — Patient Instructions (Addendum)
Please continue: - Metformin 500 mg 2x a day - Ozempic 1 mg weekly in a.m.   Increase: - U500 - 30 min before the meal: - 30 units before cappuccino - 40-60 units before brunch - 60-80 units before dinner     Check with your insurance which supplier can be used for your sensor.  Please continue Levothyroxine 150 mcg daily.   Take the thyroid hormone every day, with water, at least 30 minutes before breakfast, separated by at least 4 hours from: - acid reflux medications - calcium - iron - multivitamins   Please return in 3 months.

## 2022-12-24 NOTE — Progress Notes (Signed)
Patient ID: Mary Acosta, female   DOB: 19-May-1956, 67 y.o.   MRN: [id number]   HPI: Mary Acosta is a 67 y.o.-year-old female, initially referred by her PCP, Dr. Lawerance Bach, returning for follow-up for DM2, dx in 2000, insulin-dependent since 2011, uncontrolled, with long-term complications (peripheral neuropathy, gastroparesis, cerebrovascular disease with history of TIA 2012, DKA 2015) and also history of thyroid cancer and postsurgical hypothyroidism.  She previously saw my colleague, Dr. Everardo All, up to 07/2019.  Last visit with me 3 months ago.  Interim hx: No increased urination, blurry vision, nausea, chest pain. She has sero-negative RA >> started MTX and Prednisone >> stopped MTX 2/2 liver dysfunction and was on 15 mg Prednisone >> weight gain and very high CBGs >> came off prednisone. She tells me that she was off the sensor for several days and was taking much less U-500 insulin.  DM2: Reviewed HbA1c levels: Lab Results  Component Value Date   HGBA1C 10.6 (A) 08/08/2022   HGBA1C 11.9 (A) 05/10/2022   HGBA1C 8.1 (A) 10/09/2021   HGBA1C 8.6 (A) 06/04/2021   HGBA1C 6.9 (A) 01/15/2021   HGBA1C 7.6 (A) 10/13/2020   HGBA1C 8.1 (A) 07/05/2020   HGBA1C 10.4 (H) 05/03/2020   HGBA1C 11.0 (H) 10/22/2019   HGBA1C 9.4 (H) 07/20/2019   She was previously on: - Basaglar 80 units at bedtime -[place] >> stopped >> once a week >> 50 >> 80 >> 160 units daily >> 80 units twice a day  - Metformin 1000 mg with dinner >> 500 mg 2x a day - Ozempic 0.5 >> 1 mg weekly - - Lyumjev 10-20 >> 20-25 units before meals >> muscle cramps >> decreased: 14 units Prev. On Rybelsus 3 mg before breakfast-see patient assistance pgm  She was on N and R insulin in the past. She was on metformin in the past >> did not work. She was prev. On Toujeo 180 units daily.  Currently on: - Metformin 500 mg 2x a day - Ozempic 1 mg weekly in a.m.  - U500 - 30 min before the meal: - 80-100 units before  b'fast  >> 60 units before brunch - - 50-60 units before dinner >> 60 units before dinner  Pt checks her sugars 4x  times a day - now with CGM (with receiver):  Prev.: - am:   539, 589 >> 226, 281, 354, 561 >> 250-275 - 2h after b'fast: n/c - before lunch: n/c >> 320s - 2h after lunch: n/c - before dinner: 130-150 >> 220 >> 160-250 >> n/c >> 300s - 2h after dinner: n/c - bedtime:  250-260 >> <250 >> 300s >> 255, 261, 384 >> HI - nighttime: n/c Lowest sugar was 226 >> 250; she has hypoglycemia awareness in the 70s. Highest sugar was 500s >> 561 >> Hi.  Glucometer: AccuChek, Prodigy  Pt's meals are: - Breakfast: bacon or sausage and eggs, cereal >> grits, or grilled cheese - Lunch: salad; hot dog, sandwiches >> ham and cheese sandwich or skips - Dinner: meat + veggies  >> larger - Snacks: not so much anymore Previously drinking Kool-Aid, now stopped.  However, she is still having that she is drinking High C juice.   She is status post gastric banding with vagotomy in 2008.  She lost 148 pounds afterwards but then gained more than 100 pounds back.  No h/o pancreatitis or FH of MTC.  No CKD, last BUN/creatinine:  Lab Results  Component Value Date   BUN  17 10/15/2022   BUN 12 07/12/2022   CREATININE 1.30 (H) 10/15/2022   CREATININE 1.10 07/12/2022  Not on ACE inhibitor/ARB.  + HL; last set of lipids: Lab Results  Component Value Date   CHOL 183 10/09/2021   HDL 52.90 10/09/2021   LDLCALC 104 (H) 10/09/2021   TRIG 127.0 10/09/2021   CHOLHDL 3 10/09/2021  On Zocor 20, Zetia 10.  - last eye exam was in 2023 reportedly: no DR. Stopped IO injections for macular degeneration.  -She has pain, numbness, tingling in her feet.  Last foot exam 09/2021.  On ASA 81.  Pt has FH of DM in mother.  Thyroid cancer:  Reviewed her thyroid cancer history: She is status post total thyroidectomy - [place] Sx 2007. 11/22/2005: THYROID, THYROIDECTOMY:   - PAPILLARY THYROID  CARCINOMA (0.5 CM).   - ADENOMATOID NODULES.    COMMENT   ONCOLOGY TABLE-THYROID    1. Maximum tumor size (cm): 0.5 cm   2. Tumor location: Right mid lobe   3. Multifocality: Not identified   4. Histology: Papillary thyroid carcinoma   5. Margins: Negative   6. Capsular invasion: Present   7. Extrathyroidal extension: Not identified   8. Vascular/Lymphatic invasion: Not identified   9. Lymph nodes: N/A   10. TNM code: pT1, pNX, pMX   11. Comments: Examination of the thyroidectomy specimen   grossly demonstrates multiple nodules. Histologic evaluation   demonstrates numerous adenomatoid nodules. One nodule measuring   0.5 cm demonstrates features compatible with papillary thyroid   carcinoma. The surgical margins are negative. (MS:jy) 11/25/05   Neck U/S (10/25/2020): no abnormal neck masses  Postsurgical hypothyroidism:  She takes 150 mcg levothyroxine daily: - no missed doses anymore - in am - fasting - at least 30 min from b'fast - no calcium - no iron - no multivitamins - no PPIs - not on Biotin  Reviewed her TFTs: Lab Results  Component Value Date   TSH 4.62 05/10/2022   TSH 0.13 (L) 10/09/2021   TSH 0.51 02/02/2021   TSH 0.14 (L) 08/04/2020   TSH 4.42 05/03/2020   TSH 1.50 10/22/2019   TSH 1.09 07/20/2019   TSH 1.32 04/01/2019   TSH 0.55 09/04/2018   TSH 0.90 12/26/2017   TSH 0.113 (L) 12/08/2017   TSH 0.22 (L) 09/19/2017   TSH 0.62 06/13/2017   TSH 1.43 01/22/2017   TSH 6.67 (H) 12/10/2016   Pt denies: - feeling nodules in neck - hoarseness - dysphagia - choking  She also has a history of COPD, OSA, fibromyalgia, depression, fatty liver-cirrhosis, DDD, h/o vb fx, PMR. She is seen in pain clinic in [facility]  She is on Neurontin and amitriptyline. She lost 2 sons: 2020 and 09/2020.    ROS: + see HPI  I reviewed pt's medications, allergies, PMH, social hx, family hx, and changes were documented in the history of present illness. Otherwise,  unchanged from my initial visit note.  Past Medical History:  Diagnosis Date   AKI (acute kidney injury) (HCC) 01/2017   Allergy    seasonal   Anxiety    with panic attacks   Arthritis    "back; feet; hands; shoulders" (08/26/2014)   Asthma    Cataract    forming   Cervical cancer (HCC)    Chronic lower back pain    Chronic narcotic use    Chronic pain syndrome    [facility]   Cirrhosis (HCC)    Clostridium difficile  infection 2017   COPD (chronic obstructive pulmonary disease) (HCC)    Daily headache    past hx   Depression    Diabetic neuropathy (HCC) 06/04/2017   feet  and legs , some in hands   DJD (degenerative joint disease)    Fatty liver disease, nonalcoholic    Fibromyalgia    Frequency of urination    HCAP (healthcare-associated pneumonia) 08/26/2014   History of TIA (transient ischemic attack) 11/01/2010   NO RESIDUAL   Hyperlipidemia    Hypertension    Hypothyroidism    IDDM (insulin dependent diabetes mellitus)    Insomnia    Lumbar stenosis    Macular degeneration    Memory difficulty 07/25/2016   Nocturia    OSA (obstructive sleep apnea)    NO CPAP SINCE WT LOSS   Osteoarthritis    with severe disease in knee   Oxygen deficiency    4  liters at bedtime only   Pneumonia "several times"   Polymyalgia rheumatica (HCC)    Scoliosis    Seasonal allergies    Stroke [facility])    TIA   Thyroid cancer (HCC)    Urgency of urination    Vaginal pain S/P SLING  FEB 2012   Past Surgical History:  Procedure Laterality Date   APPENDECTOMY  1982   BREAST EXCISIONAL BIOPSY Left 02/28/2005   Atypical Ductal Hyperplasia   CARDIAC CATHETERIZATION  09/04/2004   NORMAL CORONARY ANATOMY/ NORMAL LVF/ EF 60%   CARDIOVASCULAR STRESS TEST  12-27-2010  DR [place]   ABNORMAL NUCLEAR STUDY W/ /MILD INFERIOR ISCHEMIA/ EF 69%/  CT HEART ANGIOGRAM ;  NO ACUTE FINDINGS   COLONOSCOPY     CRYOABLATION  05/16/2003   w/LEEP FOR ABNORMAL PAP SMEAR   CYSTOSCOPY   05/18/2012   Procedure: CYSTOSCOPY;  Surgeon: Martina Sinner, MD;  Location: [facility] ;  Service: Urology;  Laterality: N/A;  examination under anethesia   ESOPHAGOGASTRODUODENOSCOPY (EGD) WITH PROPOFOL N/A 09/03/2016   Procedure: ESOPHAGOGASTRODUODENOSCOPY (EGD) WITH PROPOFOL;  Surgeon: Sherrilyn Rist, MD;  Location: [facility];  Service: Gastroenterology;  Laterality: N/A;   ESOPHAGOGASTRODUODENOSCOPY (EGD) WITH PROPOFOL N/A 11/19/2021   Procedure: ESOPHAGOGASTRODUODENOSCOPY (EGD) WITH PROPOFOL;  Surgeon: Sherrilyn Rist, MD;  Location: [facility];  Service: Gastroenterology;  Laterality: N/A;  Varices screeing, cirrhosis   HYSTEROSCOPY WITH D & C  08/19/2007   PMB   KNEE ARTHROSCOPY W/ DEBRIDEMENT Left 03/29/2006   INTERNAL DERANGEMENT/ SEVERE DJD/ MENISCUS TEARS   LAPAROSCOPIC CHOLECYSTECTOMY  06/10/2002   LAPAROSCOPIC GASTRIC BANDING  03/01/2006   TRUNCAL VAGOTOMY/ PLACEMENT OF VG BAND   REVISION TOTAL KNEE ARTHROPLASTY Left 08-29-2008; 05/2009   TONSILLECTOMY  1969   TOTAL KNEE ARTHROPLASTY Left 01/23/2007   SEVERE DJD   TOTAL THYROIDECTOMY  11/22/2005   BILATERAL THYROID NODULES-- PAPILLARY CARCINOMA (0.5CM)/ ADENOMATOID NODULES   TRANSTHORACIC ECHOCARDIOGRAM  12/27/2010   LVSF NORMAL / EF 55-65%/ GRADE I DIASTOLIC DYSFUNCTION/ MILD MITRAL REGURG. / MILDLY DILATED LEFT ATRIUM/ MILDY INCREASED SYSTOLIC PRESSURE OF PULMONARY ARTERIES   TRANSVAGINAL SUBURETERAL TAPE/ SLING  09/28/2010   MIXED URINARY INCONTINENCE   TUBAL LIGATION  1983   Social History   Socioeconomic History   Marital status: Married    Spouse name: Not on file   Number of children: 2   Years of education: 12   Highest education level: Not on file  Occupational History   Occupation: disabled    Employer: UNEMPLOYED  Tobacco Use  Smoking status: Former    Packs/day: 2.50    Years: 39.00    Additional pack years: 0.00    Total pack years: 97.50    Types: Cigarettes    Quit  date: 10/22/2010    Years since quitting: 12.1   Smokeless tobacco: Never  Vaping Use   Vaping Use: Never used  Substance and Sexual Activity   Alcohol use: No    Alcohol/week: 0.0 standard drinks of alcohol   Drug use: No   Sexual activity: Not Currently  Other Topics Concern   Not on file  Social History Narrative   Lives at home w/ her husband    Right-handed   Caffeine: 1 cup of coffee per week + [employer]   Social Determinants of Health   Financial Resource Strain: Medium Risk (06/06/2022)   Overall Financial Resource Strain (CARDIA)    Difficulty of Paying Living Expenses: Somewhat hard  Food Insecurity: No Food Insecurity (06/06/2022)   Hunger Vital Sign    Worried About Running Out of Food in the Last Year: Never true    Ran Out of Food in the Last Year: Never true  Transportation Needs: No Transportation Needs (06/06/2022)   PRAPARE - Administrator, Civil Service (Medical): No    Lack of Transportation (Non-Medical): No  Physical Activity: Inactive (06/06/2022)   Exercise Vital Sign    Days of Exercise per Week: 0 days    Minutes of Exercise per Session: 0 min  Stress: No Stress Concern Present (06/06/2022)   [employer] of Occupational Health - Occupational Stress Questionnaire    Feeling of Stress : Only a little  Social Connections: Socially Integrated (06/06/2022)   Social Connection and Isolation Panel [NHANES]    Frequency of Communication with Friends and Family: More than three times a week    Frequency of Social Gatherings with Friends and Family: More than three times a week    Attends Religious Services: More than 4 times per year    Active Member of [employer] or Organizations: Yes    Attends Engineer, structural: More than 4 times per year    Marital Status: Married  Catering manager Violence: Not At Risk (06/06/2022)   Humiliation, Afraid, Rape, and Kick questionnaire    Fear of Current or Ex-Partner: No    Emotionally Abused: No     Physically Abused: No    Sexually Abused: No   Current Outpatient Medications on File Prior to Visit  Medication Sig Dispense Refill   Adalimumab (HUMIRA PEN) 40 MG/0.4ML PNKT Inject 40 mg into the skin every 14 (fourteen) days. Every other Thursday     albuterol (VENTOLIN HFA) 108 (90 Base) MCG/ACT inhaler TAKE 2 PUFFS BY MOUTH EVERY 6 HOURS AS NEEDED FOR WHEEZE OR SHORTNESS OF BREATH 18 g 11   Albuterol Sulfate (PROAIR RESPICLICK) 108 (90 Base) MCG/ACT AEPB 1 puff as needed Inhalation every 4 hrs     amLODipine (NORVASC) 5 MG tablet TAKE ONE TABLET BY MOUTH AT BREAKFAST AND AT BEDTIME 60 tablet 5   buprenorphine (BUTRANS) 20 MCG/HR PTWK 1 patch once a week.     busPIRone (BUSPAR) 15 MG tablet Take 15 mg by mouth daily as needed (for anxiety).     celecoxib (CELEBREX) 100 MG capsule Take 100 mg by mouth 2 (two) times daily.     cloNIDine (CATAPRES) 0.1 MG tablet Take 0.1 mg by mouth every 8 (eight) hours as needed.     colchicine 0.6 MG  tablet Take one tab bid x 2 days and then daily 10 tablet 0   Continuous Blood Gluc Sensor (FREESTYLE LIBRE 3 SENSOR) MISC 1 each by Does not apply route every 14 (fourteen) days. 6 each 3   ergocalciferol (VITAMIN D2) 1.25 MG (50000 UT) capsule Take 50,000 Units by mouth once a week. Monday     fexofenadine (ALLEGRA) 180 MG tablet Take 180 mg by mouth daily as needed for allergies or rhinitis.     folic acid (FOLVITE) 1 MG tablet Take 1 mg by mouth daily.     furosemide (LASIX) 40 MG tablet TAKE ONE TABLET BY MOUTH every morning 30 tablet 5   gabapentin (NEURONTIN) 300 MG capsule Take 300 mg by mouth 3 (three) times daily as needed (pain).     hydrALAZINE (APRESOLINE) 25 MG tablet TAKE ONE TABLET BY MOUTH AT BREAKFAST AND AT BEDTIME May take additional tab if bp is over 170/80 90 tablet 2   hydrOXYzine (ATARAX) 50 MG tablet Take 50 mg by mouth at bedtime as needed.     hydrOXYzine (VISTARIL) 50 MG capsule Take 50 mg by mouth 2 (two) times daily as needed  for itching or anxiety.     Insulin Pen Needle 32G X 4 MM MISC Use 3x a day 300 each 3   insulin regular human CONCENTRATED (HUMULIN R U-500 KWIKPEN) 500 UNIT/ML KwikPen Inject under skin 30 min before meals - total of 200 units a day as advised 18 mL 3   Insulin Syringe-Needle U-100 26G X 1/2" 1 ML MISC Use daily for insulin injection as directed 100 each 3   Ipratropium-Albuterol (COMBIVENT RESPIMAT) 20-100 MCG/ACT AERS respimat Inhale 1 puff into the lungs every 6 (six) hours as needed for wheezing. 4 g 5   ipratropium-albuterol (DUONEB) 0.5-2.5 (3) MG/3ML SOLN Take 3 mLs by nebulization every 6 (six) hours as needed (Wheezing or dyspnea.). 360 mL 11   levothyroxine (SYNTHROID) 150 MCG tablet TAKE ONE TABLET BY MOUTH BEFORE BREAKFAST 90 tablet 3   metFORMIN (GLUCOPHAGE) 500 MG tablet TAKE ONE TABLET BY MOUTH EVERY MORNING and TAKE ONE TABLET BY MOUTH EVERYDAY AT BEDTIME 180 tablet 3   metoprolol succinate (TOPROL-XL) 25 MG 24 hr tablet Take 1 tablet (25 mg total) by mouth at bedtime. Take with or immediately following a meal. 90 tablet 3   OXYGEN Inhale 3 L into the lungs at bedtime.     PARoxetine (PAXIL) 40 MG tablet Take 1 tablet (40 mg total) by mouth daily. 90 tablet 1   potassium chloride SA (KLOR-CON M20) 20 MEQ tablet TAKE 2 TABLETS EVERY DAY AS NEEDED WHEN YOU TAKE LASIX 180 tablet 1   promethazine (PHENERGAN) 25 MG tablet Take 0.5 tablets (12.5 mg total) by mouth every 8 (eight) hours as needed for nausea or vomiting. 30 tablet 0   Propylene Glycol (SYSTANE BALANCE) 0.6 % SOLN Place 1 drop into both eyes daily as needed (dry eyes).     Semaglutide, 1 MG/DOSE, (OZEMPIC, 1 MG/DOSE,) 4 MG/3ML SOPN Inject 1 mg into the skin once a week. 9 mL 3   simvastatin (ZOCOR) 20 MG tablet Take 1 tablet (20 mg total) by mouth daily at 6 PM. 90 tablet 3   tiZANidine (ZANAFLEX) 4 MG tablet Take 4 mg by mouth every 8 (eight) hours as needed for muscle spasms.     traZODone (DESYREL) 150 MG tablet Take 150  mg by mouth at bedtime as needed for sleep.     No current  facility-administered medications on file prior to visit.   Allergies  Allergen Reactions   Gabapentin Swelling    Swelling in legs    Losartan Other (See Comments)    Myalgias and muscle cramping    Aricept [Donepezil Hcl]     Nausea/vomiting, low BP   Aripiprazole Other (See Comments)    Patient reports Hypoglycemia   Hydroxyzine     hallucinations   Oxycodone-Acetaminophen Itching   Oxycodone Itching   Sulfa Antibiotics Nausea Only and Rash   Sulfonamide Derivatives Nausea Only and Rash   Family History  Problem Relation Age of Onset   Colon polyps Mother    Diabetes Mother    Heart disease Mother    Dementia Mother    Heart disease Father    High blood pressure Father    Colon cancer Maternal Uncle        x 3   Stomach cancer Paternal Uncle    Breast cancer Other        great aunts x 5   Esophageal cancer Neg Hx    Rectal cancer Neg Hx    PE: BP 136/74 (BP Location: Right Arm, Patient Position: Sitting, Cuff Size: Normal)   Pulse 91   Ht 5\' 9"  (1.753 m)   Wt (!) 302 lb 6.4 oz (137.2 kg)   SpO2 92%   BMI 44.66 kg/m  Wt Readings from Last 3 Encounters:  12/24/22 (!) 302 lb 6.4 oz (137.2 kg)  10/15/22 (!) 303 lb 9.6 oz (137.7 kg)  08/08/22 (!) 310 lb (140.6 kg)   Constitutional: overweight, in NAD Eyes:  EOMI, no exophthalmos ENT: no neck masses, no cervical lymphadenopathy Cardiovascular: RRR, No MRG Respiratory: CTA B Musculoskeletal: no deformities Skin:no rashes Neurological: no tremor with outstretched hands Diabetic Foot Exam - Simple   Simple Foot Form Diabetic Foot exam was performed with the following findings: Yes 12/24/2022 10:15 AM  Visual Inspection No deformities, no ulcerations, no other skin breakdown bilaterally: Yes Sensation Testing Intact to touch and monofilament testing bilaterally: Yes Pulse Check Posterior Tibialis and Dorsalis pulse intact bilaterally:  Yes Comments Onychodystrophy Trophic changes of toe skin B    ASSESSMENT: 1. DM2, insulin-dependent, uncontrolled, with complications - cerebrovascular disease with history of TIA 2012 - peripheral neuropathy - gastroparesis - DKA 2015  2.  History of thyroid cancer  3.  Postsurgical hypothyroidism  PLAN:  1. Patient with longstanding, uncontrolled, type 2 diabetes, on metformin, weekly GLP-1 receptor agonist and now concentrated insulin (U-500), switched from Basaglar and Lyumjev at last visit.  At that time, she was already requiring 200 units or more of insulin a day and sugars were still high.  We continued Ozempic and metformin and recommended the U-500 insulin regimen.  HbA1c at that time was lower, but still very high, at 10.6%. CGM interpretation: -At today's visit, we reviewed her CGM downloads: It appears that 12% of values are in target range (goal >70%), while 57% are higher than 180 (goal <25%), and 1% are lower than 70 (goal <4%).  The calculated average blood sugar is 272.  The projected HbA1c for the next 3 months (GMI) is 9.8%. -Reviewing the CGM trends, sugars appear to be very high, almost entirely above target, but she does have occasional drops in blood sugars especially in the evening, before midnight -Upon questioning, she was using lower doses of U-500 insulin than recommended, especially when she was off her sensor.  She tells me that she is not able to get  the sensor from [employer] to her insurance plan.  I advised her to call the insurance and find out which supplier we can work with.  In the meantime, reviewing her blood sugars I explained that she absolutely needs to take her insulin before each meal and also before her cappuccino in the morning.  She wakes up around 7 to 8 AM, drinks per cappuccino and only eats brunch around 11 AM.  I advised her to take 30 units of U-500 insulin before her cappuccino and then to take U-500 before brunch and increase the dose before  dinner. - I suggested to:  Patient Instructions  Please continue: - Metformin 500 mg 2x a day - Ozempic 1 mg weekly in a.m.   Increase: - U500 - 30 min before the meal: - 30 units before cappuccino - 40-60 units before brunch - 60-80 units before dinner     Check with your insurance which supplier can be used for your sensor.  Please continue Levothyroxine 150 mcg daily.   Take the thyroid hormone every day, with water, at least 30 minutes before breakfast, separated by at least 4 hours from: - acid reflux medications - calcium - iron - multivitamins   Please return in 3 months.       - we checked her HbA1c: 12.1% (higher) - advised to check sugars at different times of the day - 4x a day, rotating check times - advised for yearly eye exams  - will check a lipid panel today - RTC in 3 mo  2.  History of thyroid cancer -She had a papillary thyroid cancer focus in the right thyroid lobe, measuring 0.5 cm.  No vascular, lymphatic or direct extension into the surrounding tissues -She denies neck compression symptoms -A neck ultrasound in 10/2020 showed no suspicious masses -she is most likely cured  3.  Postsurgical hypothyroidism - latest thyroid labs reviewed with pt. >> normal: Lab Results  Component Value Date   TSH 4.62 05/10/2022  - she continues on LT4 150 mcg daily - pt feels good on this dose. - we discussed about taking the thyroid hormone every day, with water, >30 minutes before breakfast, separated by >4 hours from acid reflux medications, calcium, iron, multivitamins. Pt. is taking it correctly. - will check thyroid tests today  Component     Latest Ref Rng 12/24/2022  TSH     0.35 - 5.50 uIU/mL 10.66 (H)   T4,Free(Direct)     0.60 - 1.60 ng/dL 1.61   Hemoglobin W9U     4.0 - 5.6 % 12.1 !   Cholesterol     0 - 200 mg/dL 045   Triglycerides     0.0 - 149.0 mg/dL 409.8   HDL Cholesterol     >39.00 mg/dL 11.91 (L)   VLDL     0.0 - 40.0 mg/dL 47.8    LDL (calc)     0 - 99 mg/dL 66   Total CHOL/HDL Ratio 4   NonHDL 93.09   HDL low LDL only slightly above target of less than 55 due to cerebrovascular disease. TSH is higher than before, though.  Will increase the levothyroxine dose to 175 mcg daily and recheck the tests in 1.5 months.  Carlus Pavlov, MD PhD [facility]ocr[facility]

## 2022-12-27 DIAGNOSIS — E039 Hypothyroidism, unspecified: Secondary | ICD-10-CM | POA: Diagnosis not present

## 2022-12-27 DIAGNOSIS — J449 Chronic obstructive pulmonary disease, unspecified: Secondary | ICD-10-CM | POA: Diagnosis not present

## 2022-12-27 DIAGNOSIS — R32 Unspecified urinary incontinence: Secondary | ICD-10-CM | POA: Diagnosis not present

## 2022-12-27 DIAGNOSIS — E1142 Type 2 diabetes mellitus with diabetic polyneuropathy: Secondary | ICD-10-CM | POA: Diagnosis not present

## 2022-12-27 DIAGNOSIS — F419 Anxiety disorder, unspecified: Secondary | ICD-10-CM | POA: Diagnosis not present

## 2022-12-27 DIAGNOSIS — G4733 Obstructive sleep apnea (adult) (pediatric): Secondary | ICD-10-CM | POA: Diagnosis not present

## 2022-12-27 DIAGNOSIS — Z008 Encounter for other general examination: Secondary | ICD-10-CM | POA: Diagnosis not present

## 2022-12-27 DIAGNOSIS — E1165 Type 2 diabetes mellitus with hyperglycemia: Secondary | ICD-10-CM | POA: Diagnosis not present

## 2022-12-27 DIAGNOSIS — Z794 Long term (current) use of insulin: Secondary | ICD-10-CM | POA: Diagnosis not present

## 2022-12-27 DIAGNOSIS — N1831 Chronic kidney disease, stage 3a: Secondary | ICD-10-CM | POA: Diagnosis not present

## 2022-12-27 DIAGNOSIS — M1990 Unspecified osteoarthritis, unspecified site: Secondary | ICD-10-CM | POA: Diagnosis not present

## 2022-12-27 DIAGNOSIS — F909 Attention-deficit hyperactivity disorder, unspecified type: Secondary | ICD-10-CM | POA: Diagnosis not present

## 2022-12-27 DIAGNOSIS — E785 Hyperlipidemia, unspecified: Secondary | ICD-10-CM | POA: Diagnosis not present

## 2022-12-27 LAB — HEMOGLOBIN A1C: A1c: 12.3

## 2023-01-01 DIAGNOSIS — E119 Type 2 diabetes mellitus without complications: Secondary | ICD-10-CM | POA: Diagnosis not present

## 2023-01-03 ENCOUNTER — Telehealth: Payer: Self-pay | Admitting: Internal Medicine

## 2023-01-03 NOTE — Telephone Encounter (Signed)
[facility] called reporting Pt A1C was 12.3 and also reaching out to pt to encourage her to make appointment. Best call back number is [phone]

## 2023-01-03 NOTE — Telephone Encounter (Signed)
Patient has been started on meds by Dr. Elvera Lennox

## 2023-01-03 NOTE — Telephone Encounter (Signed)
Yes, needs to see endo

## 2023-01-08 DIAGNOSIS — H43821 Vitreomacular adhesion, right eye: Secondary | ICD-10-CM | POA: Diagnosis not present

## 2023-01-08 DIAGNOSIS — H35033 Hypertensive retinopathy, bilateral: Secondary | ICD-10-CM | POA: Diagnosis not present

## 2023-01-08 DIAGNOSIS — H353231 Exudative age-related macular degeneration, bilateral, with active choroidal neovascularization: Secondary | ICD-10-CM | POA: Diagnosis not present

## 2023-01-08 DIAGNOSIS — H2513 Age-related nuclear cataract, bilateral: Secondary | ICD-10-CM | POA: Diagnosis not present

## 2023-01-10 DIAGNOSIS — J449 Chronic obstructive pulmonary disease, unspecified: Secondary | ICD-10-CM | POA: Diagnosis not present

## 2023-01-13 NOTE — Progress Notes (Deleted)
Cardiology Office Note:    Date:  01/13/2023   ID:  Mary Acosta, DOB 01-20-1956, MRN [id number]  PCP:  Pincus Sanes, MD  Cardiologist:  Olga Millers, MD  Electrophysiologist:  None   Referring MD: Pincus Sanes, MD   Chief Complaint: follow-up of dyspnea and labile BP  History of Present Illness:    Mary Acosta is a 67 y.o. female with a history of normal coronaries on cardiac catheterization in 2006 and coronary CTA in 12/2010, chronic diastolic CHF, paroxysmal SVT, prior stroke, COPD with pulmonary hypertension, hypertension, hyperlipidemia, type 2 diabetes mellitus, hypothyroidism, obstructive sleep apnea unable to tolerate CPAP, cirrhosis, fibromyalgia, polymyalgia rheumatica, and chronic pain syndrome on narcotics who is followed by Dr. Jens Som and presents today for follow-up of dyspnea on exertion and labile BP.   Remote cardiac catheterization in 2006 showed normal coronaries. Coronary CTA in 12/2010 showed a coronary calcium score of 0 with no plaque or stenosis noted in the coronary tree but the distal LAD and distal PDA were not well visualized. Event Monitor in 12/2017 showed no significant arrhythmias. Last Echo in 06/2019 showed LVEF of 60-65% with grade 1 diastolic dysfunction, normal RV, and no significant valvular disease.  She was last seen by me in 09/2022 at which time she reported worsening dyspnea over the last 5-6 months with minial activity such as walking from her bedroom to her living room. She denied any chest pain, orthopnea, PND, or edema. She also reported very labile BP with associated lightheadedness/dizziness if BP is low. She reported that she typically does not sleep well at night due to frequent urination and when she does get a good night of sleep, her "BP will be so high she can barely open her eyes." She said her BP had been as high as 250/130 in the past after a good night of sleep.  She was prescribed Amlodipine 5mg  twice daily ,  Lopressor 25mg  twice daily, Hydralazine 25 twice daily. However, she reported only taking these in the evenings and then would take morning dose of Lopressor and Hydralazine in the morning if BP markedly elevated and BP will gradually come down to the 130s/80s.  Deconditioning and morbid obesity were felt to be contributing to dyspnea. However, BNP was ordered and was normal. Echo was also ordered and showed LVEF of >75% with normal wall motion, mild LVH, and no significant valvular disease. Lopressor was switched to Toprol-XL (given she was usually only taking the evening dose) and she was advised to keep a BP/HR log for 2 weeks. Untreated sleep apnea was felt to likely be contributing to elevated BP but patient has been unable to tolerate CPAP in the past.    Patient presents today for follow-up. ***  Dyspnea on Exertion Patient reports worsening dyspnea on exertion over the past several month at last visit in 09/2022. BNP was normal. Echo was also ordered and showed LVEF of >75% with normal wall motion, mild LVH, and no significant valvular disease.  - *** - Suspect dyspnea is at least partially due to deconditioning and morbid obesity. Ischemic evaluation ***  Chronic Diastolic CHF Echo in 10/2022 showed  LVEF of >75% with normal wall motion, mild LVH, and no significant valvular disease. - Euvolemic on exam. *** - Continue Lasix 40mg  daily as needed for edema.    Paroxysmal SVT Monitor in 12/2017 showed normal sinus rhythm to sinus tachycardia but no significant arrhythmia.  - She reports occasional brief palpitations  but overall stable. *** - Continue Toprol-XL 25mg  daily.   Labile Hypertension Patient has a history of labile BP. *** - Continue current medications: Amlodipine 5mg  twice daily ,Toprol-XL 25mg  daily, and Hydralazine 25 twice daily. She states she usually only take Hydralazine at night but will take Hydralazine in the morning if her BP is markedly elevated. OK to continue to do  this.  - ***   Hyperlipidemia Lipid panel in 11/2022: Total Cholesterol 127, Triglycerides 135, HDL 33.9, LDL 66.  - Continue Simvastatin 20mg  daily.    Type 2 Diabetes Mellitus - Uncontrolled Hemoglobin A1c in 12.1 in 11/2022 - On Metformin, Semaglutide, and Insulin (U-500). - Management per Endocrinology.  Past Medical History:  Diagnosis Date   AKI (acute kidney injury) (HCC) 01/2017   Allergy    seasonal   Anxiety    with panic attacks   Arthritis    "back; feet; hands; shoulders" (08/26/2014)   Asthma    Cataract    forming   Cervical cancer (HCC)    Chronic lower back pain    Chronic narcotic use    Chronic pain syndrome    [facility]   Cirrhosis (HCC)    Clostridium difficile infection 2017   COPD (chronic obstructive pulmonary disease) (HCC)    Daily headache    past hx   Depression    Diabetic neuropathy (HCC) 06/04/2017   feet  and legs , some in hands   DJD (degenerative joint disease)    Fatty liver disease, nonalcoholic    Fibromyalgia    Frequency of urination    HCAP (healthcare-associated pneumonia) 08/26/2014   History of TIA (transient ischemic attack) 11/01/2010   NO RESIDUAL   Hyperlipidemia    Hypertension    Hypothyroidism    IDDM (insulin dependent diabetes mellitus)    Insomnia    Lumbar stenosis    Macular degeneration    Memory difficulty 07/25/2016   Nocturia    OSA (obstructive sleep apnea)    NO CPAP SINCE WT LOSS   Osteoarthritis    with severe disease in knee   Oxygen deficiency    4  liters at bedtime only   Pneumonia "several times"   Polymyalgia rheumatica (HCC)    Scoliosis    Seasonal allergies    Stroke [facility])    TIA   Thyroid cancer (HCC)    Urgency of urination    Vaginal pain S/P SLING  FEB 2012    Past Surgical History:  Procedure Laterality Date   APPENDECTOMY  1982   BREAST EXCISIONAL BIOPSY Left 02/28/2005   Atypical Ductal Hyperplasia   CARDIAC CATHETERIZATION  09/04/2004   NORMAL  CORONARY ANATOMY/ NORMAL LVF/ EF 60%   CARDIOVASCULAR STRESS TEST  12-27-2010  DR [place]   ABNORMAL NUCLEAR STUDY W/ /MILD INFERIOR ISCHEMIA/ EF 69%/  CT HEART ANGIOGRAM ;  NO ACUTE FINDINGS   COLONOSCOPY     CRYOABLATION  05/16/2003   w/LEEP FOR ABNORMAL PAP SMEAR   CYSTOSCOPY  05/18/2012   Procedure: CYSTOSCOPY;  Surgeon: Martina Sinner, MD;  Location: [facility];  Service: Urology;  Laterality: N/A;  examination under anethesia   ESOPHAGOGASTRODUODENOSCOPY (EGD) WITH PROPOFOL N/A 09/03/2016   Procedure: ESOPHAGOGASTRODUODENOSCOPY (EGD) WITH PROPOFOL;  Surgeon: Sherrilyn Rist, MD;  Location: [facility];  Service: Gastroenterology;  Laterality: N/A;   ESOPHAGOGASTRODUODENOSCOPY (EGD) WITH PROPOFOL N/A 11/19/2021   Procedure: ESOPHAGOGASTRODUODENOSCOPY (EGD) WITH PROPOFOL;  Surgeon: Sherrilyn Rist, MD;  Location: [facility];  Service: Gastroenterology;  Laterality: N/A;  Varices screeing, cirrhosis   HYSTEROSCOPY WITH D & C  08/19/2007   PMB   KNEE ARTHROSCOPY W/ DEBRIDEMENT Left 03/29/2006   INTERNAL DERANGEMENT/ SEVERE DJD/ MENISCUS TEARS   LAPAROSCOPIC CHOLECYSTECTOMY  06/10/2002   LAPAROSCOPIC GASTRIC BANDING  03/01/2006   TRUNCAL VAGOTOMY/ PLACEMENT OF VG BAND   REVISION TOTAL KNEE ARTHROPLASTY Left 08-29-2008; 05/2009   TONSILLECTOMY  1969   TOTAL KNEE ARTHROPLASTY Left 01/23/2007   SEVERE DJD   TOTAL THYROIDECTOMY  11/22/2005   BILATERAL THYROID NODULES-- PAPILLARY CARCINOMA (0.5CM)/ ADENOMATOID NODULES   TRANSTHORACIC ECHOCARDIOGRAM  12/27/2010   LVSF NORMAL / EF 55-65%/ GRADE I DIASTOLIC DYSFUNCTION/ MILD MITRAL REGURG. / MILDLY DILATED LEFT ATRIUM/ MILDY INCREASED SYSTOLIC PRESSURE OF PULMONARY ARTERIES   TRANSVAGINAL SUBURETERAL TAPE/ SLING  09/28/2010   MIXED URINARY INCONTINENCE   TUBAL LIGATION  1983    Current Medications: No outpatient medications have been marked as taking for the 01/24/23 encounter (Appointment) with Corrin Parker,  PA-C.     Allergies:   Gabapentin, Losartan, Aricept [donepezil hcl], Aripiprazole, Hydroxyzine, Oxycodone-acetaminophen, Oxycodone, Sulfa antibiotics, and Sulfonamide derivatives   Social History   Socioeconomic History   Marital status: Married    Spouse name: Not on file   Number of children: 2   Years of education: 12   Highest education level: Not on file  Occupational History   Occupation: disabled    Employer: UNEMPLOYED  Tobacco Use   Smoking status: Former    Packs/day: 2.50    Years: 39.00    Additional pack years: 0.00    Total pack years: 97.50    Types: Cigarettes    Quit date: 10/22/2010    Years since quitting: 12.2   Smokeless tobacco: Never  Vaping Use   Vaping Use: Never used  Substance and Sexual Activity   Alcohol use: No    Alcohol/week: 0.0 standard drinks of alcohol   Drug use: No   Sexual activity: Not Currently  Other Topics Concern   Not on file  Social History Narrative   Lives at home w/ her husband    Right-handed   Caffeine: 1 cup of coffee per week + [employer]   Social Determinants of Health   Financial Resource Strain: Medium Risk (06/06/2022)   Overall Financial Resource Strain (CARDIA)    Difficulty of Paying Living Expenses: Somewhat hard  Food Insecurity: No Food Insecurity (06/06/2022)   Hunger Vital Sign    Worried About Running Out of Food in the Last Year: Never true    Ran Out of Food in the Last Year: Never true  Transportation Needs: No Transportation Needs (06/06/2022)   PRAPARE - Administrator, Civil Service (Medical): No    Lack of Transportation (Non-Medical): No  Physical Activity: Inactive (06/06/2022)   Exercise Vital Sign    Days of Exercise per Week: 0 days    Minutes of Exercise per Session: 0 min  Stress: No Stress Concern Present (06/06/2022)   [employer] of Occupational Health - Occupational Stress Questionnaire    Feeling of Stress : Only a little  Social Connections: Socially Integrated  (06/06/2022)   Social Connection and Isolation Panel [NHANES]    Frequency of Communication with Friends and Family: More than three times a week    Frequency of Social Gatherings with Friends and Family: More than three times a week    Attends Religious Services: More than 4 times per year  Active Member of Clubs or Organizations: Yes    Attends Engineer, structural: More than 4 times per year    Marital Status: Married     Family History: The patient's family history includes Breast cancer in an other family member; Colon cancer in her maternal uncle; Colon polyps in her mother; Dementia in her mother; Diabetes in her mother; Heart disease in her father and mother; High blood pressure in her father; Stomach cancer in her paternal uncle. There is no history of Esophageal cancer or Rectal cancer.  ROS:   Please see the history of present illness.     EKGs/Labs/Other Studies Reviewed:    The following studies were reviewed:  Coronary CTA 01/16/2011: Impression: 1. Coronary calcium score = 0, placing patient in a low risk group.  2.  No plaque or stenosis noted in the coronary tree.  The distal  LAD and distal PDA were not well-visualized.  3.  Noncardiac findings to be reported by [facility].  _______________   Event Monitor 12/29/2017 to 01/27/2018 Sinus to sinus tachycardia. No significant arrhythmias.  _______________  Echocardiogram 11/14/2022: Impressions: 1. Left ventricular ejection fraction, by estimation, is >75%. The left  ventricle has hyperdynamic function. The left ventricle has no regional  wall motion abnormalities. There is mild left ventricular hypertrophy.  Left ventricular diastolic parameters  are indeterminate.   2. Right ventricular systolic function is normal. The right ventricular  size is normal.   3. The mitral valve is normal in structure. No evidence of mitral valve  regurgitation. No evidence of mitral stenosis.   4. The aortic  valve has an indeterminant number of cusps. Aortic valve  regurgitation is not visualized. No aortic stenosis is present.    EKG:  EKG  ordered today. EKG personally reviewed and demonstrates ***.  Recent Labs: 07/12/2022: ALT 13; Pro B Natriuretic peptide (BNP) 94.0 10/15/2022: BNP 37.7; BUN 17; Creatinine, Ser 1.30; Hemoglobin 13.4; Platelets 203; Potassium 5.1; Sodium 137 12/24/2022: TSH 10.66  Recent Lipid Panel    Component Value Date/Time   CHOL 127 12/24/2022 1032   CHOL 193 02/17/2020 1116   TRIG 135.0 12/24/2022 1032   HDL 33.90 (L) 12/24/2022 1032   HDL 48 02/17/2020 1116   CHOLHDL 4 12/24/2022 1032   VLDL 27.0 12/24/2022 1032   LDLCALC 66 12/24/2022 1032   LDLCALC 55 05/03/2020 1517    Physical Exam:    Vital Signs: There were no vitals taken for this visit.    Wt Readings from Last 3 Encounters:  12/24/22 (!) 302 lb 6.4 oz (137.2 kg)  10/15/22 (!) 303 lb 9.6 oz (137.7 kg)  08/08/22 (!) 310 lb (140.6 kg)     General: 67 y.o. female in no acute distress. HEENT: Normocephalic and atraumatic. Sclera clear. EOMs intact. Neck: Supple. No carotid bruits. No JVD. Heart: *** RRR. Distinct S1 and S2. No murmurs, gallops, or rubs. Radial and distal pedal pulses 2+ and equal bilaterally. Lungs: No increased work of breathing. Clear to ausculation bilaterally. No wheezes, rhonchi, or rales.  Abdomen: Soft, non-distended, and non-tender to palpation. Bowel sounds present in all 4 quadrants.  MSK: Normal strength and tone for age. *** Extremities: No lower extremity edema.    Skin: Warm and dry. Neuro: Alert and oriented x3. No focal deficits. Psych: Normal affect. Responds appropriately.   Assessment:    No diagnosis found.  Plan:     Disposition: Follow up in ***   Medication Adjustments/Labs and Tests Ordered: Current medicines  are reviewed at length with the patient today.  Concerns regarding medicines are outlined above.  No orders of the defined types were  placed in this encounter.  No orders of the defined types were placed in this encounter.   There are no Patient Instructions on file for this visit.   Signed, Corrin Parker, PA-C  01/13/2023 8:08 AM    [facility]

## 2023-01-24 ENCOUNTER — Ambulatory Visit: Payer: Medicare HMO | Admitting: Student

## 2023-02-05 ENCOUNTER — Other Ambulatory Visit: Payer: Self-pay | Admitting: Gastroenterology

## 2023-02-10 ENCOUNTER — Other Ambulatory Visit: Payer: Self-pay | Admitting: Cardiology

## 2023-02-10 DIAGNOSIS — J449 Chronic obstructive pulmonary disease, unspecified: Secondary | ICD-10-CM | POA: Diagnosis not present

## 2023-02-14 DIAGNOSIS — G894 Chronic pain syndrome: Secondary | ICD-10-CM | POA: Diagnosis not present

## 2023-02-14 DIAGNOSIS — M5137 Other intervertebral disc degeneration, lumbosacral region: Secondary | ICD-10-CM | POA: Diagnosis not present

## 2023-02-14 DIAGNOSIS — F418 Other specified anxiety disorders: Secondary | ICD-10-CM | POA: Diagnosis not present

## 2023-02-14 DIAGNOSIS — E1142 Type 2 diabetes mellitus with diabetic polyneuropathy: Secondary | ICD-10-CM | POA: Diagnosis not present

## 2023-02-14 DIAGNOSIS — M533 Sacrococcygeal disorders, not elsewhere classified: Secondary | ICD-10-CM | POA: Diagnosis not present

## 2023-02-14 DIAGNOSIS — F1911 Other psychoactive substance abuse, in remission: Secondary | ICD-10-CM | POA: Diagnosis not present

## 2023-02-14 DIAGNOSIS — F332 Major depressive disorder, recurrent severe without psychotic features: Secondary | ICD-10-CM | POA: Diagnosis not present

## 2023-02-14 DIAGNOSIS — F4323 Adjustment disorder with mixed anxiety and depressed mood: Secondary | ICD-10-CM | POA: Diagnosis not present

## 2023-02-16 NOTE — Progress Notes (Signed)
Cardiology Office Note:    Date:  02/26/2023   ID:  Mary Acosta, DOB 10-09-1955, MRN [id number]  PCP:  Pincus Sanes, MD  Cardiologist:  Olga Millers, MD  Electrophysiologist:  None   Referring MD: Pincus Sanes, MD   Chief Complaint: follow-up of dyspnea on exertion   History of Present Illness:    Mary Acosta is a 67 y.o. female with a history of normal coronaries on cardiac catheterization in 2006 and coronary CTA in 12/2010, chronic diastolic CHF, paroxysmal SVT, prior stroke, COPD with pulmonary hypertension, hypertension, hyperlipidemia, poorly controlled type 2 diabetes mellitus,  CKD stage III, hypothyroidism, obstructive sleep apnea unable to tolerate CPAP, cirrhosis, fibromyalgia, polymyalgia rheumatica, and chronic pain syndrome on narcotics who is followed by Dr. Jens Som and presents today for follow-up of dyspnea on exertion.   Remote cardiac catheterization in 2006 showed normal coronaries. Coronary CTA in 12/2010 showed a coronary calcium score of 0 with no plaque or stenosis noted in the coronary tree but the distal LAD and distal PDA were not well visualized. Event Monitor in 12/2017 showed no significant arrhythmias. Last Echo in 06/2019 showed LVEF of 60-65% with grade 1 diastolic dysfunction, normal RV, and no significant valvular disease.  She was last seen by me in 09/2022 at which time she reported worsening dyspnea over the last 5-6 months with minial activity such as walking from her bedroom to her living room. She denied any chest pain, orthopnea, PND, or edema. She also reported very labile BP with associated lightheadedness/dizziness if BP is low. She reported that she typically does not sleep well at night due to frequent urination and when she does get a good night of sleep, her "BP will be so high she can barely open her eyes." She said her BP had been as high as 250/130 in the past after a good night of sleep.  She was prescribed Amlodipine  5mg  twice daily , Lopressor 25mg  twice daily, Hydralazine 25 twice daily. However, she reported only taking these in the evenings and then would take morning dose of Lopressor and Hydralazine in the morning if BP markedly elevated and BP will gradually come down to the 130s/80s.  Deconditioning and morbid obesity were felt to be contributing to dyspnea. However, BNP was ordered and was normal. Echo was also ordered and showed LVEF of >75% with normal wall motion, mild LVH, and no significant valvular disease. Lopressor was switched to Toprol-XL (given she was usually only taking the evening dose) and she was advised to keep a BP/HR log for 2 weeks. Untreated sleep apnea was felt to likely be contributing to elevated BP but patient has been unable to tolerate CPAP in the past.    Patient presents today for follow-up. She has been having a lot of back and knee issues since her last visit. She is very sedentary because of this. She has gained 20 lbs in a little over 2 months. She continues to have dyspnea on exertion and feel like it is worse since her last visit. She states she cannot walk any distance without getting short of breath. She can barely even walk from her bedroom to her bathroom and she states this is due to her back pain, knee pain, and breathing. She has stable 2 pillow orthopnea. No PND. She has some chronic swelling in her legs but it has been worse the last 3-4 days. No chest pain. She describes occasional heart racing when she exerts  herself and gets acutely short of breath. She notes occasional lightheadedness/ dizziness when her blood pressure is low and states she has fallen because of this. No syncope. She states she had an episode of lightheadedness and dizziness last night and this morning. She states this was because her BP was low but never actually took her BP. I wonder if this was actually because she was hypoxic. O2 sat was 87% on arrival. Improved to low 90% after being placed on 2L of  O2. O2 dropped back down to as low as 80% when O2 was removed. She did not look like she was in any acute distress when her O2 sat was 80%. She states she has O2 at night but only uses it at night. I suspect her O2 sat may be chronically in the 80s at home.   Of note, she has significant insomnia and states it is not uncommon for her to go 3 days without sleep. When she finally gets a good night sleep, she states her O2 level will drop (she states it has dropped as low as 40%) and her BP will spike. Suspect this is due to untreated sleep apnea and obesity hypoventilation syndrome. She has been diagnosed with sleep apnea in the past but has been unable to tolerate CPAP due to claustrophobia.   EKGs/Labs/Other Studies Reviewed:    The following studies were reviewed:  Coronary CTA 01/16/2011: Impression: 1. Coronary calcium score = 0, placing patient in a low risk group.  2.  No plaque or stenosis noted in the coronary tree.  The distal  LAD and distal PDA were not well-visualized.  3.  Noncardiac findings to be reported by [facility].  _______________   Event Monitor 12/29/2017 to 01/27/2018 Sinus to sinus tachycardia. No significant arrhythmias.  _______________  Echocardiogram 11/14/2022: Impressions: 1. Left ventricular ejection fraction, by estimation, is >75%. The left  ventricle has hyperdynamic function. The left ventricle has no regional  wall motion abnormalities. There is mild left ventricular hypertrophy.  Left ventricular diastolic parameters  are indeterminate.   2. Right ventricular systolic function is normal. The right ventricular  size is normal.   3. The mitral valve is normal in structure. No evidence of mitral valve  regurgitation. No evidence of mitral stenosis.   4. The aortic valve has an indeterminant number of cusps. Aortic valve  regurgitation is not visualized. No aortic stenosis is present.    EKG:  EKG  ordered today.   EKG  Interpretation Date/Time:  Wednesday February 26 2023 11:35:55 EDT Ventricular Rate:  83 PR Interval:  218 QRS Duration:  130 QT Interval:  400 QTC Calculation: 470 R Axis:   149  Text Interpretation: Sinus rhythm with 1st degree A-V block Right bundle branch block No acute ST/T wave changes.  Confirmed by Marjie Skiff [phone]) on 02/26/2023 11:46:24 AM    Recent Labs: 07/12/2022: ALT 13; Pro B Natriuretic peptide (BNP) 94.0 10/15/2022: BNP 37.7; BUN 17; Creatinine, Ser 1.30; Hemoglobin 13.4; Platelets 203; Potassium 5.1; Sodium 137 12/24/2022: TSH 10.66  Recent Lipid Panel    Component Value Date/Time   CHOL 127 12/24/2022 1032   CHOL 193 02/17/2020 1116   TRIG 135.0 12/24/2022 1032   HDL 33.90 (L) 12/24/2022 1032   HDL 48 02/17/2020 1116   CHOLHDL 4 12/24/2022 1032   VLDL 27.0 12/24/2022 1032   LDLCALC 66 12/24/2022 1032   LDLCALC 55 05/03/2020 1517    Physical Exam:    Vital Signs: BP 130/70 (BP  Location: Left Arm, Patient Position: Sitting, Cuff Size: Large)   Pulse 83   Ht 5\' 9"  (1.753 m)   Wt (!) 322 lb 9.6 oz (146.3 kg)   SpO2 (!) 87%   BMI 47.64 kg/m     Wt Readings from Last 3 Encounters:  02/26/23 (!) 322 lb 9.6 oz (146.3 kg)  12/24/22 (!) 302 lb 6.4 oz (137.2 kg)  10/15/22 (!) 303 lb 9.6 oz (137.7 kg)     General: 67 y.o. morbidly obese Caucasian female in no acute distress. HEENT: Normocephalic and atraumatic. Sclera clear. EOMs intact. Neck: Supple. JVD difficult to assess due to body habitus. Heart: RRR. No significant murmurs, gallops, or rubs. Radial and distal pedal pulses 2+ and equal bilaterally. Lungs: No increased work of breathing. Clear to ausculation bilaterally. No wheezes, rhonchi, or rales.  Abdomen: Soft, non-distended, and non-tender to palpation. Extremities: 1+ pitting edema of bilateral lower extremities.   Skin: Warm and dry. Neuro: Alert and oriented x3. No focal deficits. Psych: Normal affect. Responds  appropriately.  Assessment:    1. Acute on chronic diastolic CHF (congestive heart failure) (HCC)   2. Paroxysmal SVT (supraventricular tachycardia)   3. Primary hypertension   4. Hyperlipidemia, unspecified hyperlipidemia type   5. Type 2 diabetes mellitus with obesity (HCC)   6. Stage 3 chronic kidney disease, unspecified whether stage 3a or 3b CKD (HCC)     Plan:    Acute on Chronic Diastolic CHF Patient reports worsening dyspnea on exertion since last visit in 09/2022 and has gained 20 in 2 months. She was hypoxic in the office with O2 sat as low as 80% on room air. She has O2 at home but was not wearing it. Improved to 95% when placed on 2 L of O2. Echo in 10/2022 showed LVEF of >75% with normal wall motion and mild LVH, normal RV, and no significant valvular disease. BNP was normal at last office visit but suspect this is falsely low to due to body habitus. It is very difficult to assess volume status due to her morbid obesity. BMI in 47.64 mg. However, I suspect she is volume overloaded given weight gain. Given hypoxia and degree of weight gain, I think she needs IV Lasix. Patient would really like to avoid going to the ED. Discussed with DOD (Dr. Antoine Poche) and decision made to apply Furoscix 80mg  injection while in the office which will slowly get infused over 5 hours. Can continue PO Lasix 40mg  daily starting tomorrow. Will repeat BNP and BMET today. She will follow back up in our office in 2 days (02/28/2023). If no improvement and still hypoxic, will likely need to be admitted for additional IV Lasix. Emphasized the importance of patient going straight home and applying home O2 if O2 sat <90. Also advised patient to go to the ED if she develops any worsening symptoms prior to follow-up visit.   Of note, I suspect her baseline dyspnea on exertion is multifactorial in nature from COPD, diastolic CHF, untreated sleep apnea, and morbid obesity and obesity hypoventilation syndrome. She follows  with Pulmonology. Also recommended she call and schedule a follow-up visit with them given her worsening hypoxia as they manage her home O2.  Paroxysmal SVT Monitor in 12/2017 showed normal sinus rhythm to sinus tachycardia but no significant arrhythmia. She reports occasional palpitations when she exerts herself and gets acute short of breath. Continue Toprol-XL 25mg  daily.   Hypertension She has a history of labile BP in the past. BP well  controlled in the office. Continue current medications: Amlodipine 5mg  twice daily ,Toprol-XL 25mg  daily, and Hydralazine 25 twice daily. Also has PRN Clonidine.    Hyperlipidemia Lipid panel in 11/2022: Total Cholesterol 127, Triglycerides 135, HDL 33.9, LDL 66. Continue Simvastatin 20mg  daily.    Type 2 Diabetes Mellitus - Uncontrolled Hemoglobin A1c in 12.3 in 12/2022. On Metformin, Semaglutide, and Insulin (U-500). Management per Endocrinology.  CKD Stage III Baseline creatinine around 1.0 to 1.3.   Disposition: Follow up in 2 days.  Total Time: I spent 70 minutes reviewing examining patient today, monitoring O2 level with and without supplemental O2, discussing case with attending physician, and administering Furoscix. I then spent additional time documenting and charting.   Medication Adjustments/Labs and Tests Ordered: Current medicines are reviewed at length with the patient today.  Concerns regarding medicines are outlined above.  Orders Placed This Encounter  Procedures   Basic metabolic panel   Brain natriuretic peptide   EKG 12-Lead   Meds ordered this encounter  Medications   Furosemide (FUROSCIX) 80 MG/10ML CTKT    Sig: Inject 1 Dose into the skin once for 1 dose.    Dispense:  1 each    Refill:  0    Order Specific Question:   Lot Number?    Answer:   [id number]    Order Specific Question:   Expiration Date?    Answer:   10/23/2024    Order Specific Question:   NDC    Answer:   45409-811-91 [[id number]]    Order Specific Question:    Quantity    Answer:   1    Patient Instructions  Medication Instructions:  *If you need a refill on your cardiac medications before your next appointment, please call your pharmacy*   Lab Work: Drawn today  If you have labs (blood work) drawn today and your tests are completely normal, you will receive your results only by: MyChart Message (if you have MyChart) OR A paper copy in the mail If you have any lab test that is abnormal or we need to change your treatment, we will call you to review the results.   Testing/Procedures: none   Follow-Up: At [facility], you and your health needs are our priority.  As part of our continuing mission to provide you with exceptional heart care, we have created designated Provider Care Teams.  These Care Teams include your primary Cardiologist (physician) and Advanced Practice Providers (APPs -  Physician Assistants and Nurse Practitioners) who all work together to provide you with the care you need, when you need it.  We recommend signing up for the patient portal called "MyChart".  Sign up information is provided on this After Visit Summary.  MyChart is used to connect with patients for Virtual Visits (Telemedicine).  Patients are able to view lab/test results, encounter notes, upcoming appointments, etc.  Non-urgent messages can be sent to your provider as well.   To learn more about what you can do with MyChart, go to [email or web address].    Your next appointment:   2 day(s)  Provider:   Bernadene Person, NP            Signed, Corrin Parker, PA-C  02/26/2023 10:33 PM    [facility]

## 2023-02-26 ENCOUNTER — Ambulatory Visit: Payer: Medicare HMO | Attending: Student | Admitting: Student

## 2023-02-26 ENCOUNTER — Encounter: Payer: Self-pay | Admitting: Student

## 2023-02-26 VITALS — BP 130/70 | HR 83 | Ht 69.0 in | Wt 322.6 lb

## 2023-02-26 DIAGNOSIS — E785 Hyperlipidemia, unspecified: Secondary | ICD-10-CM

## 2023-02-26 DIAGNOSIS — E669 Obesity, unspecified: Secondary | ICD-10-CM | POA: Diagnosis not present

## 2023-02-26 DIAGNOSIS — I10 Essential (primary) hypertension: Secondary | ICD-10-CM | POA: Diagnosis not present

## 2023-02-26 DIAGNOSIS — E1169 Type 2 diabetes mellitus with other specified complication: Secondary | ICD-10-CM

## 2023-02-26 DIAGNOSIS — I471 Supraventricular tachycardia, unspecified: Secondary | ICD-10-CM | POA: Diagnosis not present

## 2023-02-26 DIAGNOSIS — I5033 Acute on chronic diastolic (congestive) heart failure: Secondary | ICD-10-CM

## 2023-02-26 DIAGNOSIS — N183 Chronic kidney disease, stage 3 unspecified: Secondary | ICD-10-CM

## 2023-02-26 DIAGNOSIS — Z794 Long term (current) use of insulin: Secondary | ICD-10-CM

## 2023-02-26 MED ORDER — FUROSCIX 80 MG/10ML ~~LOC~~ CTKT: 1.0000 | CARTRIDGE | Freq: Once | SUBCUTANEOUS | 0 refills | Status: DC

## 2023-02-26 NOTE — Patient Instructions (Addendum)
Medication Instructions:  *If you need a refill on your cardiac medications before your next appointment, please call your pharmacy*   Lab Work: Drawn today  If you have labs (blood work) drawn today and your tests are completely normal, you will receive your results only by: MyChart Message (if you have MyChart) OR A paper copy in the mail If you have any lab test that is abnormal or we need to change your treatment, we will call you to review the results.   Testing/Procedures: none   Follow-Up: At [facility], you and your health needs are our priority.  As part of our continuing mission to provide you with exceptional heart care, we have created designated Provider Care Teams.  These Care Teams include your primary Cardiologist (physician) and Advanced Practice Providers (APPs -  Physician Assistants and Nurse Practitioners) who all work together to provide you with the care you need, when you need it.  We recommend signing up for the patient portal called "MyChart".  Sign up information is provided on this After Visit Summary.  MyChart is used to connect with patients for Virtual Visits (Telemedicine).  Patients are able to view lab/test results, encounter notes, upcoming appointments, etc.  Non-urgent messages can be sent to your provider as well.   To learn more about what you can do with MyChart, go to [email or web address].    Your next appointment:   2 day(s)  Provider:   Bernadene Person, NP

## 2023-02-27 ENCOUNTER — Other Ambulatory Visit: Payer: Self-pay

## 2023-02-27 ENCOUNTER — Emergency Department (HOSPITAL_COMMUNITY)
Admission: EM | Admit: 2023-02-27 | Discharge: 2023-02-27 | Disposition: A | Discharge status: Home / Self Care | Payer: Medicare HMO | Attending: Emergency Medicine | Admitting: Emergency Medicine

## 2023-02-27 ENCOUNTER — Telehealth: Payer: Self-pay | Admitting: Student in an Organized Health Care Education/Training Program

## 2023-02-27 ENCOUNTER — Encounter (HOSPITAL_COMMUNITY): Payer: Self-pay

## 2023-02-27 ENCOUNTER — Emergency Department (HOSPITAL_COMMUNITY): Payer: Medicare HMO

## 2023-02-27 DIAGNOSIS — J449 Chronic obstructive pulmonary disease, unspecified: Secondary | ICD-10-CM | POA: Diagnosis not present

## 2023-02-27 DIAGNOSIS — I110 Hypertensive heart disease with heart failure: Secondary | ICD-10-CM | POA: Diagnosis not present

## 2023-02-27 DIAGNOSIS — I10 Essential (primary) hypertension: Secondary | ICD-10-CM | POA: Diagnosis not present

## 2023-02-27 DIAGNOSIS — Z794 Long term (current) use of insulin: Secondary | ICD-10-CM | POA: Insufficient documentation

## 2023-02-27 DIAGNOSIS — E039 Hypothyroidism, unspecified: Secondary | ICD-10-CM | POA: Insufficient documentation

## 2023-02-27 DIAGNOSIS — R0989 Other specified symptoms and signs involving the circulatory and respiratory systems: Secondary | ICD-10-CM | POA: Diagnosis not present

## 2023-02-27 DIAGNOSIS — Z7984 Long term (current) use of oral hypoglycemic drugs: Secondary | ICD-10-CM | POA: Diagnosis not present

## 2023-02-27 DIAGNOSIS — I5032 Chronic diastolic (congestive) heart failure: Secondary | ICD-10-CM | POA: Diagnosis not present

## 2023-02-27 DIAGNOSIS — R600 Localized edema: Secondary | ICD-10-CM | POA: Insufficient documentation

## 2023-02-27 DIAGNOSIS — E871 Hypo-osmolality and hyponatremia: Secondary | ICD-10-CM | POA: Insufficient documentation

## 2023-02-27 DIAGNOSIS — E1165 Type 2 diabetes mellitus with hyperglycemia: Secondary | ICD-10-CM | POA: Diagnosis not present

## 2023-02-27 DIAGNOSIS — Z79899 Other long term (current) drug therapy: Secondary | ICD-10-CM | POA: Diagnosis not present

## 2023-02-27 DIAGNOSIS — R7989 Other specified abnormal findings of blood chemistry: Secondary | ICD-10-CM | POA: Diagnosis not present

## 2023-02-27 DIAGNOSIS — Z9189 Other specified personal risk factors, not elsewhere classified: Secondary | ICD-10-CM | POA: Diagnosis not present

## 2023-02-27 DIAGNOSIS — R110 Nausea: Secondary | ICD-10-CM | POA: Diagnosis not present

## 2023-02-27 DIAGNOSIS — R799 Abnormal finding of blood chemistry, unspecified: Secondary | ICD-10-CM | POA: Diagnosis not present

## 2023-02-27 DIAGNOSIS — R899 Unspecified abnormal finding in specimens from other organs, systems and tissues: Secondary | ICD-10-CM

## 2023-02-27 DIAGNOSIS — R944 Abnormal results of kidney function studies: Secondary | ICD-10-CM | POA: Diagnosis not present

## 2023-02-27 DIAGNOSIS — R69 Illness, unspecified: Secondary | ICD-10-CM

## 2023-02-27 DIAGNOSIS — E875 Hyperkalemia: Secondary | ICD-10-CM | POA: Diagnosis not present

## 2023-02-27 LAB — BASIC METABOLIC PANEL
Anion gap: 11 (ref 5–15)
BUN/Creatinine Ratio: 13 (ref 12–28)
BUN: 17 mg/dL (ref 8–27)
BUN: 21 mg/dL (ref 8–23)
CO2: 22 mmol/L (ref 20–29)
CO2: 25 mmol/L (ref 22–32)
Calcium: 8.9 mg/dL (ref 8.7–10.3)
Calcium: 8.5 mg/dL — ABNORMAL LOW (ref 8.9–10.3)
Chloride: 92 mmol/L — ABNORMAL LOW (ref 96–106)
Chloride: 92 mmol/L — ABNORMAL LOW (ref 98–111)
Creatinine, Ser: 1.27 mg/dL — ABNORMAL HIGH (ref 0.57–1.00)
Creatinine, Ser: 1.41 mg/dL — ABNORMAL HIGH (ref 0.44–1.00)
GFR, Estimated: 41 mL/min — ABNORMAL LOW (ref >60)
Glucose, Bld: 503 mg/dL (ref 70–99)
Glucose: 377 mg/dL — ABNORMAL HIGH (ref 70–99)
Potassium: 6 mmol/L (ref 3.5–5.2)
Potassium: 4.5 mmol/L (ref 3.5–5.1)
Sodium: 135 mmol/L (ref 134–144)
Sodium: 128 mmol/L — ABNORMAL LOW (ref 135–145)
eGFR: 47 mL/min/{1.73_m2} — ABNORMAL LOW (ref >59)

## 2023-02-27 LAB — CBC WITH DIFFERENTIAL/PLATELET
Abs Immature Granulocytes: 0.04 10*3/uL (ref 0.00–0.07)
Basophils Absolute: 0 10*3/uL (ref 0.0–0.1)
Basophils Relative: 0 %
Eosinophils Absolute: 0.2 10*3/uL (ref 0.0–0.5)
Eosinophils Relative: 2 %
HCT: 40.8 % (ref 36.0–46.0)
Hemoglobin: 12.1 g/dL (ref 12.0–15.0)
Immature Granulocytes: 0 %
Lymphocytes Relative: 9 %
Lymphs Abs: 0.9 10*3/uL (ref 0.7–4.0)
MCH: 23.2 pg — ABNORMAL LOW (ref 26.0–34.0)
MCHC: 29.7 g/dL — ABNORMAL LOW (ref 30.0–36.0)
MCV: 78.3 fL — ABNORMAL LOW (ref 80.0–100.0)
Monocytes Absolute: 0.4 10*3/uL (ref 0.1–1.0)
Monocytes Relative: 4 %
Neutro Abs: 8.8 10*3/uL — ABNORMAL HIGH (ref 1.7–7.7)
Neutrophils Relative %: 85 %
Platelets: 137 10*3/uL — ABNORMAL LOW (ref 150–400)
RBC: 5.21 MIL/uL — ABNORMAL HIGH (ref 3.87–5.11)
RDW: 15.5 % (ref 11.5–15.5)
WBC: 10.4 10*3/uL (ref 4.0–10.5)
nRBC: 0 % (ref 0.0–0.2)

## 2023-02-27 LAB — TROPONIN I (HIGH SENSITIVITY): Troponin I (High Sensitivity): 10 ng/L (ref <18)

## 2023-02-27 LAB — BLOOD GAS, VENOUS
Acid-base deficit: 0.1 mmol/L (ref 0.0–2.0)
Bicarbonate: 26.8 mmol/L (ref 20.0–28.0)
O2 Saturation: 88.3 %
Patient temperature: 37
pCO2, Ven: 52 mmHg (ref 44–60)
pH, Ven: 7.32 (ref 7.25–7.43)
pO2, Ven: 59 mmHg — ABNORMAL HIGH (ref 32–45)

## 2023-02-27 LAB — CBG MONITORING, ED: Glucose-Capillary: 326 mg/dL — ABNORMAL HIGH (ref 70–99)

## 2023-02-27 LAB — BRAIN NATRIURETIC PEPTIDE: B Natriuretic Peptide: 41.5 pg/mL (ref 0.0–100.0)

## 2023-02-27 MED ORDER — INSULIN ASPART 100 UNIT/ML IJ SOLN
15.0000 [IU] | Freq: Once | INTRAMUSCULAR | Status: AC
  Administered 2023-02-27: 15 [IU] via SUBCUTANEOUS
  Filled 2023-02-27: qty 0.15

## 2023-02-27 NOTE — Discharge Instructions (Signed)
1.  Continue to work close with your cardiologist regarding use of Lasix and diuresis. 2.  Return to the emergency department if you have new or worsening symptoms of any type. 3.  Your potassium was normal at 4.5.  No adjustments are needed to your regular use of potassium.

## 2023-02-27 NOTE — ED Triage Notes (Signed)
Patient had labs drawn yesterday and was told her potassium is 6. Has nausea and has whole body cramps.

## 2023-02-27 NOTE — ED Provider Notes (Signed)
[place] EMERGENCY DEPARTMENT AT [facility] Provider Note   CSN: [id number] Arrival date & time: 02/27/23  4098     History  Chief Complaint  Patient presents with   Abnormal Lab    Mary Acosta is a 67 y.o. female.  HPI  history of normal coronaries on cardiac catheterization in 2006 and coronary calcium score in 12/2010, chronic diastolic CHF, paroxysmal SVT, prior stroke, COPD with pulmonary hypertension, hypertension, hyperlipidemia, type 2 diabetes mellitus, hypothyroidism, cirrhosis, fibromyalgia, polymyalgia rheumatica, and chronic pain syndrome   Patient is presented to the emergency department because she was called by her outpatient provider for abnormal lab value.  Her potassium resulted at 6.0 mmol/L.  Patient reports that she does not remember the exact reason that she was seeing her doctor getting labs done.  She reports she has had a lot of swelling and bloating shortness of breath and they gave her a new Lasix device that delivers subcutaneous Lasix over 5 hours.  Review of EMR indicates she was given Furoscix 80 mg injection in the office.  Patient reports that she gets a lot of cramping as well and so she took a couple of extra doses of potassium last night as well.  He does have home oxygen to use.  EMR indicates that her shortness of breath is probably multifactorial from COPD\diastolic CHF\untreated sleep apnea and morbid obesity with hypoventilation.  Patient really does not have acute complaints in the emergency department but reports she is presenting due to her instruction for high potassium evaluation.  Patient reports she did not take any of her morning medications before coming in.  She reports her blood sugar is chronically difficult to manage.    Home Medications Prior to Admission medications   Medication Sig Start Date End Date Taking? Authorizing Provider  Adalimumab (HUMIRA PEN) 40 MG/0.4ML PNKT Inject 40 mg into the skin every 14  (fourteen) days. Every other Thursday Patient not taking: Reported on 02/26/2023 01/08/22   [provider]  albuterol (VENTOLIN HFA) 108 (90 Base) MCG/ACT inhaler TAKE 2 PUFFS BY MOUTH EVERY 6 HOURS AS NEEDED FOR WHEEZE OR SHORTNESS OF BREATH 06/21/22   Burns, Bobette Mo, MD  Albuterol Sulfate (PROAIR RESPICLICK) 108 (90 Base) MCG/ACT AEPB 1 puff as needed Inhalation every 4 hrs    [provider]  amLODipine (NORVASC) 5 MG tablet TAKE ONE TABLET BY MOUTH AT BREAKFAST AND AT BEDTIME 12/13/22   Pincus Sanes, MD  buprenorphine (BUTRANS) 20 MCG/HR PTWK 1 patch once a week. 06/11/22   [provider]  Buprenorphine HCl 150 MCG FILM Place inside cheek. 03/02/23   [provider]  busPIRone (BUSPAR) 15 MG tablet Take 15 mg by mouth daily as needed (for anxiety). 12/25/21   [provider]  celecoxib (CELEBREX) 100 MG capsule Take 100 mg by mouth 2 (two) times daily.    [provider]  cloNIDine (CATAPRES) 0.1 MG tablet Take 0.1 mg by mouth every 8 (eight) hours as needed. 03/11/22   [provider]  colchicine 0.6 MG tablet Take one tab bid x 2 days and then daily 12/18/22   Pincus Sanes, MD  Continuous Blood Gluc Sensor (FREESTYLE LIBRE 3 SENSOR) MISC 1 each by Does not apply route every 14 (fourteen) days. 08/08/22   Carlus Pavlov, MD  ergocalciferol (VITAMIN D2) 1.25 MG (50000 UT) capsule Take 50,000 Units by mouth once a week. Monday 11/28/21   [provider]  fexofenadine (ALLEGRA) 180  MG tablet Take 180 mg by mouth daily as needed for allergies or rhinitis.    [provider]  folic acid (FOLVITE) 1 MG tablet Take 1 mg by mouth daily. 12/21/21   [provider]  Furosemide (FUROSCIX) 80 MG/10ML CTKT Inject 1 Dose into the skin once for 1 dose. 02/26/23 02/26/23  Marjie Skiff E, PA-C  furosemide (LASIX) 40 MG tablet TAKE ONE TABLET BY MOUTH every morning 12/13/22   Pincus Sanes, MD  gabapentin (NEURONTIN) 300 MG  capsule Take 300 mg by mouth 3 (three) times daily as needed (pain). 06/10/19   [provider]  hydrALAZINE (APRESOLINE) 25 MG tablet TAKE ONE TABLET BY MOUTH AT [facility] AND AT BEDTIME May take extra tab if needed if bp over 170/80 02/11/23   Lewayne Bunting, MD  hydrOXYzine (ATARAX) 50 MG tablet Take 50 mg by mouth at bedtime as needed.    [provider]  hydrOXYzine (VISTARIL) 50 MG capsule Take 50 mg by mouth 2 (two) times daily as needed for itching or anxiety. 07/04/21   [provider]  Insulin Pen Needle 32G X 4 MM MISC Use 3x a day 11/21/22   Carlus Pavlov, MD  insulin regular human CONCENTRATED (HUMULIN R U-500 KWIKPEN) 500 UNIT/ML KwikPen Inject under skin 30 min before meals - total of 200 units a day as advised 11/21/22   Carlus Pavlov, MD  Insulin Syringe-Needle U-100 26G X 1/2" 1 ML MISC Use daily for insulin injection as directed 10/22/19   Pincus Sanes, MD  Ipratropium-Albuterol (COMBIVENT RESPIMAT) 20-100 MCG/ACT AERS respimat Inhale 1 puff into the lungs every 6 (six) hours as needed for wheezing. 06/21/22   Burns, Bobette Mo, MD  ipratropium-albuterol (DUONEB) 0.5-2.5 (3) MG/3ML SOLN Take 3 mLs by nebulization every 6 (six) hours as needed (Wheezing or dyspnea.). 06/05/20   Kalman Shan, MD  levothyroxine (SYNTHROID) 175 MCG tablet TAKE ONE TABLET BY MOUTH BEFORE BREAKFAST 12/24/22   Carlus Pavlov, MD  metFORMIN (GLUCOPHAGE) 500 MG tablet TAKE ONE TABLET BY MOUTH EVERY MORNING and TAKE ONE TABLET BY MOUTH EVERYDAY AT BEDTIME 06/11/22   Carlus Pavlov, MD  metoprolol succinate (TOPROL-XL) 25 MG 24 hr tablet Take 1 tablet (25 mg total) by mouth at bedtime. Take with or immediately following a meal. 10/15/22 10/10/23  Marjie Skiff E, PA-C  OXYGEN Inhale 3 L into the lungs at bedtime.    [provider]  PARoxetine (PAXIL) 40 MG tablet Take 1 tablet (40 mg total) by mouth daily. 02/17/20   Pincus Sanes, MD  potassium chloride SA  (KLOR-CON M20) 20 MEQ tablet TAKE 2 TABLETS EVERY DAY AS NEEDED WHEN YOU TAKE LASIX 08/05/22   Pincus Sanes, MD  promethazine (PHENERGAN) 25 MG tablet TAKE 1/2 TABLET BY MOUTH EVERY 8 HOURS AS NEEDED FOR NAUSEA AND VOMITING 02/05/23   Sherrilyn Rist, MD  Propylene Glycol (SYSTANE BALANCE) 0.6 % SOLN Place 1 drop into both eyes daily as needed (dry eyes).    [provider]  Semaglutide, 1 MG/DOSE, (OZEMPIC, 1 MG/DOSE,) 4 MG/3ML SOPN Inject 1 mg into the skin once a week. 11/21/22   Carlus Pavlov, MD  simvastatin (ZOCOR) 20 MG tablet Take 1 tablet (20 mg total) by mouth daily at 6 PM. 12/13/22   Marjie Skiff E, PA-C  tiZANidine (ZANAFLEX) 4 MG tablet Take 4 mg by mouth every 8 (eight) hours as needed for muscle spasms. 05/24/21   [provider]  traZODone (DESYREL) 150  MG tablet Take 150 mg by mouth at bedtime as needed for sleep.    [provider]      Allergies    Gabapentin, Losartan, Aricept [donepezil hcl], Aripiprazole, Hydroxyzine, Oxycodone-acetaminophen, Oxycodone, Sulfa antibiotics, and Sulfonamide derivatives    Review of Systems   Review of Systems  Physical Exam Updated Vital Signs BP 130/80   Pulse 90   Temp 98.3 F (36.8 C) (Oral)   Resp 17   Ht 5\' 9"  (1.753 m)   Wt (!) 145.2 kg   SpO2 91%   BMI 47.26 kg/m  Physical Exam Constitutional:      Comments: Patient is alert nontoxic.  No respiratory distress at rest.  HENT:     Mouth/Throat:     Pharynx: Oropharynx is clear.  Cardiovascular:     Rate and Rhythm: Normal rate and regular rhythm.  Pulmonary:     Comments: Patient does not have respiratory distress at rest.  Minimal crackles at bases. Abdominal:     Comments: Abdomen is distended and nontender.  Musculoskeletal:     Comments: 2+ bilateral pitting edema.  Skin:    General: Skin is warm and dry.  Neurological:     General: No focal deficit present.     Mental Status: She is oriented to person, place, and time.      Coordination: Coordination normal.  Psychiatric:        Mood and Affect: Mood normal.     ED Results / Procedures / Treatments   Labs (all labs ordered are listed, but only abnormal results are displayed) Labs Reviewed  BLOOD GAS, VENOUS - Abnormal; Notable for the following components:      Result Value   pO2, Ven 59 (*)    All other components within normal limits  BASIC METABOLIC PANEL - Abnormal; Notable for the following components:   Sodium 128 (*)    Chloride 92 (*)    Glucose, Bld 503 (*)    Creatinine, Ser 1.41 (*)    Calcium 8.5 (*)    GFR, Estimated 41 (*)    All other components within normal limits  CBC WITH DIFFERENTIAL/PLATELET - Abnormal; Notable for the following components:   RBC 5.21 (*)    MCV 78.3 (*)    MCH 23.2 (*)    MCHC 29.7 (*)    Platelets 137 (*)    Neutro Abs 8.8 (*)    All other components within normal limits  CBG MONITORING, ED - Abnormal; Notable for the following components:   Glucose-Capillary 326 (*)    All other components within normal limits  BRAIN NATRIURETIC PEPTIDE  TROPONIN I (HIGH SENSITIVITY)    EKG EKG Interpretation Date/Time:  Thursday February 27 2023 10:50:34 EDT Ventricular Rate:  85 PR Interval:  201 QRS Duration:  141 QT Interval:  405 QTC Calculation: 482 R Axis:   63  Text Interpretation: Sinus rhythm Right bundle branch block old RBBB. no sig change from previous Confirmed by Arby Barrette [phone]) on 02/27/2023 11:33:13 AM  Radiology DG Chest Port 1 View  Result Date: 02/27/2023 CLINICAL DATA:  Nausea, whole-body cramps, hyperkalemia EXAM: PORTABLE CHEST 1 VIEW COMPARISON:  Chest radiograph 07/12/2022 FINDINGS: The cardiomediastinal silhouette is normal Lung volumes are low. There is no focal consolidation or pulmonary edema. There is no pleural effusion or pneumothorax There is no acute osseous abnormality. IMPRESSION: Low lung volumes. Otherwise, no radiographic evidence of acute cardiopulmonary process.  Electronically Signed   By: Selena Lesser.D.  On: 02/27/2023 11:16    Procedures Procedures    Medications Ordered in ED Medications  insulin aspart (novoLOG) injection 15 Units (15 Units Subcutaneous Given 02/27/23 1335)    ED Course/ Medical Decision Making/ A&P                             Medical Decision Making Amount and/or Complexity of Data Reviewed Labs: ordered. Radiology: ordered.  Risk Prescription drug management.   Presents as outlined with reportedly elevated potassium on outpatient lab.  Will proceed with confirmatory lab work.  Patient also has suspected CHF.  She was seen at cardiology office and administered subcu long-acting Lasix for overnight.  Will check for chest x-ray and basic lab work regarding possible CHF.  Clinically patient has poor baseline physical condition with significant obesity however, does not appear to be in acute significant respiratory failure at this time.  Sodium 128 blood glucose 503 anion gap 11 BUN 21 creatinine 1.4 potassium 4.5 bicarb 25.  Venous pH 7.3 pCO2 52 BNP 41 troponin 10  Chest x-ray visually reviewed by myself and interpreted radiology no acute process.  This point patient is potassium is in normal range.  Outpatient lab value likely secondary to hemolysis.  Patient's blood sugar is 502.  Patient reports she did not take any of her medications this morning and that her blood sugar is chronically very difficult to control.  She is not showing any signs of hyperosmolar crisis.  Anion gap is normal.  She is not having active vomiting or other signs of acute illness.  At this time I have administered regular insulin and rechecked at 1 hour blood sugars down in the 300s which is close to the patient's normal range.  I have encouraged her to work closely with her doctor for glycemic control.  She reports that she has chronic difficulty managing that.  Patient does not show signs of any decompensated congestive heart failure at this  time.  Stable to continue following up with cardiology for management.          Final Clinical Impression(s) / ED Diagnoses Final diagnoses:  Abnormal laboratory test  Severe comorbid illness    Rx / DC Orders ED Discharge Orders     None         Arby Barrette, MD 02/27/23 1559

## 2023-02-27 NOTE — Telephone Encounter (Signed)
Called regarding outpatient potassium level of 6.0 without hemolysis.  Unable to reach patient, but I reached her husband, Darcella Cheshire (emergency contact). Instructed patient to: Report to ED to get labs checked Take 40 mg of home lasix now  Rosita Kea, MD MS  Overnight Cardiology Cover

## 2023-02-28 ENCOUNTER — Encounter: Payer: Self-pay | Admitting: Nurse Practitioner

## 2023-02-28 ENCOUNTER — Ambulatory Visit: Payer: Medicare HMO | Attending: Nurse Practitioner | Admitting: Nurse Practitioner

## 2023-02-28 VITALS — BP 140/70 | HR 97 | Ht 69.0 in | Wt 317.0 lb

## 2023-02-28 DIAGNOSIS — J449 Chronic obstructive pulmonary disease, unspecified: Secondary | ICD-10-CM

## 2023-02-28 DIAGNOSIS — I471 Supraventricular tachycardia, unspecified: Secondary | ICD-10-CM

## 2023-02-28 DIAGNOSIS — I5033 Acute on chronic diastolic (congestive) heart failure: Secondary | ICD-10-CM

## 2023-02-28 DIAGNOSIS — I10 Essential (primary) hypertension: Secondary | ICD-10-CM | POA: Diagnosis not present

## 2023-02-28 DIAGNOSIS — N183 Chronic kidney disease, stage 3 unspecified: Secondary | ICD-10-CM | POA: Diagnosis not present

## 2023-02-28 DIAGNOSIS — E1169 Type 2 diabetes mellitus with other specified complication: Secondary | ICD-10-CM | POA: Diagnosis not present

## 2023-02-28 DIAGNOSIS — J9611 Chronic respiratory failure with hypoxia: Secondary | ICD-10-CM | POA: Diagnosis not present

## 2023-02-28 DIAGNOSIS — E669 Obesity, unspecified: Secondary | ICD-10-CM | POA: Diagnosis not present

## 2023-02-28 DIAGNOSIS — E785 Hyperlipidemia, unspecified: Secondary | ICD-10-CM

## 2023-02-28 DIAGNOSIS — G4733 Obstructive sleep apnea (adult) (pediatric): Secondary | ICD-10-CM | POA: Diagnosis not present

## 2023-02-28 DIAGNOSIS — Z794 Long term (current) use of insulin: Secondary | ICD-10-CM

## 2023-02-28 LAB — BRAIN NATRIURETIC PEPTIDE: BNP: 36.5 pg/mL (ref 0.0–100.0)

## 2023-02-28 MED ORDER — POTASSIUM CHLORIDE CRYS ER 20 MEQ PO TBCR: EXTENDED_RELEASE_TABLET | ORAL | 3 refills | Status: DC

## 2023-02-28 MED ORDER — FUROSEMIDE 40 MG PO TABS
40.0000 mg | ORAL_TABLET | Freq: Every morning | ORAL | 3 refills | Status: DC
Start: 2023-02-28 — End: 2023-06-27

## 2023-02-28 NOTE — Patient Instructions (Addendum)
Medication Instructions:  Lasix 40 mg daily Potassium 20 mEq daily  *If you need a refill on your cardiac medications before your next appointment, please call your pharmacy*   Lab Work: NONE ordered at this time of appointment     Testing/Procedures: NONE ordered at this time of appointment     Follow-Up: At [facility], you and your health needs are our priority.  As part of our continuing mission to provide you with exceptional heart care, we have created designated Provider Care Teams.  These Care Teams include your primary Cardiologist (physician) and Advanced Practice Providers (APPs -  Physician Assistants and Nurse Practitioners) who all work together to provide you with the care you need, when you need it.  We recommend signing up for the patient portal called "MyChart".  Sign up information is provided on this After Visit Summary.  MyChart is used to connect with patients for Virtual Visits (Telemedicine).  Patients are able to view lab/test results, encounter notes, upcoming appointments, etc.  Non-urgent messages can be sent to your provider as well.   To learn more about what you can do with MyChart, go to [email or web address].    Your next appointment:   1 week(s)  Provider:   Bernadene Person, NP        Other Instructions

## 2023-02-28 NOTE — Progress Notes (Signed)
Office Visit    Patient Name: Mary Acosta Date of Encounter: 02/28/2023  Primary Care Provider:  Pincus Sanes, MD Primary Cardiologist:  Olga Millers, MD  Chief Complaint    67 year old female with a history of chronic diastolic heart failure, paroxysmal SVT, prior CVA, COPD with pulmonary hypertension, hypertension, hyperlipidemia, poorly controlled type 2 diabetes, CKD stage III, hypothyroidism, OSA not on CPAP, cirrhosis, fibromyalgia, polymyalgia rheumatica, and chronic pain syndrome on narcotics who presents today for follow-up related to heart failure.  Past Medical History    Past Medical History:  Diagnosis Date   AKI (acute kidney injury) (HCC) 01/2017   Allergy    seasonal   Anxiety    with panic attacks   Arthritis    "back; feet; hands; shoulders" (08/26/2014)   Asthma    Cataract    forming   Cervical cancer (HCC)    Chronic lower back pain    Chronic narcotic use    Chronic pain syndrome    [facility]   Cirrhosis (HCC)    Clostridium difficile infection 2017   COPD (chronic obstructive pulmonary disease) (HCC)    Daily headache    past hx   Depression    Diabetic neuropathy (HCC) 06/04/2017   feet  and legs , some in hands   DJD (degenerative joint disease)    Fatty liver disease, nonalcoholic    Fibromyalgia    Frequency of urination    HCAP (healthcare-associated pneumonia) 08/26/2014   History of TIA (transient ischemic attack) 11/01/2010   NO RESIDUAL   Hyperlipidemia    Hypertension    Hypothyroidism    IDDM (insulin dependent diabetes mellitus)    Insomnia    Lumbar stenosis    Macular degeneration    Memory difficulty 07/25/2016   Nocturia    OSA (obstructive sleep apnea)    NO CPAP SINCE WT LOSS   Osteoarthritis    with severe disease in knee   Oxygen deficiency    4  liters at bedtime only   Pneumonia "several times"   Polymyalgia rheumatica (HCC)    Scoliosis    Seasonal allergies    Stroke [facility])     TIA   Thyroid cancer (HCC)    Urgency of urination    Vaginal pain S/P SLING  FEB 2012   Past Surgical History:  Procedure Laterality Date   APPENDECTOMY  1982   BREAST EXCISIONAL BIOPSY Left 02/28/2005   Atypical Ductal Hyperplasia   CARDIAC CATHETERIZATION  09/04/2004   NORMAL CORONARY ANATOMY/ NORMAL LVF/ EF 60%   CARDIOVASCULAR STRESS TEST  12-27-2010  DR [place]   ABNORMAL NUCLEAR STUDY W/ /MILD INFERIOR ISCHEMIA/ EF 69%/  CT HEART ANGIOGRAM ;  NO ACUTE FINDINGS   COLONOSCOPY     CRYOABLATION  05/16/2003   w/LEEP FOR ABNORMAL PAP SMEAR   CYSTOSCOPY  05/18/2012   Procedure: CYSTOSCOPY;  Surgeon: Martina Sinner, MD;  Location: [facility] [place];  Service: Urology;  Laterality: N/A;  examination under anethesia   ESOPHAGOGASTRODUODENOSCOPY (EGD) WITH PROPOFOL N/A 09/03/2016   Procedure: ESOPHAGOGASTRODUODENOSCOPY (EGD) WITH PROPOFOL;  Surgeon: Sherrilyn Rist, MD;  Location: [facility];  Service: Gastroenterology;  Laterality: N/A;   ESOPHAGOGASTRODUODENOSCOPY (EGD) WITH PROPOFOL N/A 11/19/2021   Procedure: ESOPHAGOGASTRODUODENOSCOPY (EGD) WITH PROPOFOL;  Surgeon: Sherrilyn Rist, MD;  Location: [facility];  Service: Gastroenterology;  Laterality: N/A;  Varices screeing, cirrhosis   HYSTEROSCOPY WITH D & C  08/19/2007  PMB   KNEE ARTHROSCOPY W/ DEBRIDEMENT Left 03/29/2006   INTERNAL DERANGEMENT/ SEVERE DJD/ MENISCUS TEARS   LAPAROSCOPIC CHOLECYSTECTOMY  06/10/2002   LAPAROSCOPIC GASTRIC BANDING  03/01/2006   TRUNCAL VAGOTOMY/ PLACEMENT OF VG BAND   REVISION TOTAL KNEE ARTHROPLASTY Left 08-29-2008; 05/2009   TONSILLECTOMY  1969   TOTAL KNEE ARTHROPLASTY Left 01/23/2007   SEVERE DJD   TOTAL THYROIDECTOMY  11/22/2005   BILATERAL THYROID NODULES-- PAPILLARY CARCINOMA (0.5CM)/ ADENOMATOID NODULES   TRANSTHORACIC ECHOCARDIOGRAM  12/27/2010   LVSF NORMAL / EF 55-65%/ GRADE I DIASTOLIC DYSFUNCTION/ MILD MITRAL REGURG. / MILDLY DILATED LEFT ATRIUM/ MILDY  INCREASED SYSTOLIC PRESSURE OF PULMONARY ARTERIES   TRANSVAGINAL SUBURETERAL TAPE/ SLING  09/28/2010   MIXED URINARY INCONTINENCE   TUBAL LIGATION  1983    Allergies  Allergies  Allergen Reactions   Gabapentin Swelling    Swelling in legs    Losartan Other (See Comments)    Myalgias and muscle cramping    Aricept [Donepezil Hcl]     Nausea/vomiting, low BP   Aripiprazole Other (See Comments)    Patient reports Hypoglycemia   Hydroxyzine     hallucinations   Oxycodone-Acetaminophen Itching   Oxycodone Itching   Sulfa Antibiotics Nausea Only and Rash   Sulfonamide Derivatives Nausea Only and Rash     Labs/Other Studies Reviewed    The following studies were reviewed today:  Cardiac Studies & Procedures       ECHOCARDIOGRAM  ECHOCARDIOGRAM COMPLETE 11/14/2022  Narrative ECHOCARDIOGRAM REPORT    Patient Name:   Mary Acosta Date of Exam: 11/14/2022 Medical Rec #:  [id number]             Height:       69.0 in Accession #:    [id number]            Weight:       303.6 lb Date of Birth:  [birth year]            BSA:          2.467 m Patient Age:    66 years              BP:           112/67 mmHg Patient Gender: F                     HR:           124 bpm. Exam Location:  [address]  Procedure: 2D Echo, Cardiac Doppler, Color Doppler and Intracardiac Opacification Agent  Indications:    R06.00 Dyspnea  History:        Patient has prior history of Echocardiogram examinations, most recent 07/07/2019. CHF, COPD and Stroke; Risk Factors:Hypertension, Dyslipidemia, Former Smoker and Sleep Apnea. Asthma. SVT. Fibromyalgia.  Sonographer:    Cathie Beams RCS Referring Phys: [id number] CALLIE E GOODRICH  IMPRESSIONS   1. Left ventricular ejection fraction, by estimation, is >75%. The left ventricle has hyperdynamic function. The left ventricle has no regional wall motion abnormalities. There is mild left ventricular hypertrophy. Left ventricular diastolic  parameters are indeterminate. 2. Right ventricular systolic function is normal. The right ventricular size is normal. 3. The mitral valve is normal in structure. No evidence of mitral valve regurgitation. No evidence of mitral stenosis. 4. The aortic valve has an indeterminant number of cusps. Aortic valve regurgitation is not visualized. No aortic stenosis is present.  FINDINGS Left Ventricle: Left ventricular ejection fraction, by estimation, is >75%. The  left ventricle has hyperdynamic function. The left ventricle has no regional wall motion abnormalities. Definity contrast agent was given IV to delineate the left ventricular endocardial borders. The left ventricular internal cavity size was normal in size. There is mild left ventricular hypertrophy. Left ventricular diastolic parameters are indeterminate.  Right Ventricle: The right ventricular size is normal. Right ventricular systolic function is normal.  Left Atrium: Left atrial size was normal in size.  Right Atrium: Right atrial size was normal in size.  Pericardium: There is no evidence of pericardial effusion.  Mitral Valve: The mitral valve is normal in structure. Mild mitral annular calcification. No evidence of mitral valve regurgitation. No evidence of mitral valve stenosis.  Tricuspid Valve: The tricuspid valve is normal in structure. Tricuspid valve regurgitation is trivial. No evidence of tricuspid stenosis.  Aortic Valve: The aortic valve has an indeterminant number of cusps. Aortic valve regurgitation is not visualized. No aortic stenosis is present.  Pulmonic Valve: The pulmonic valve was not well visualized. Pulmonic valve regurgitation is not visualized. No evidence of pulmonic stenosis.  Aorta: The aortic root is normal in size and structure.  Venous: The inferior vena cava was not well visualized.  IAS/Shunts: No atrial level shunt detected by color flow Doppler.   LEFT VENTRICLE PLAX 2D LVIDd:         2.30  cm LVIDs:         1.70 cm LV PW:         1.20 cm LV IVS:        1.40 cm LVOT diam:     2.00 cm LV SV:         41 LV SV Index:   17 LVOT Area:     3.14 cm   RIGHT VENTRICLE RV Basal diam:  2.70 cm RV S prime:     11.26 cm/s TAPSE (M-mode): 0.7 cm  LEFT ATRIUM             Index        RIGHT ATRIUM           Index LA diam:        3.40 cm 1.38 cm/m   RA Area:     16.50 cm LA Vol (A2C):   52.4 ml 21.24 ml/m  RA Volume:   41.70 ml  16.90 ml/m LA Vol (A4C):   48.7 ml 19.74 ml/m LA Biplane Vol: 50.3 ml 20.39 ml/m AORTIC VALVE LVOT Vmax:   83.80 cm/s LVOT Vmean:  57.700 cm/s LVOT VTI:    0.132 m  AORTA Ao Root diam: 3.50 cm Ao Asc diam:  3.70 cm   SHUNTS Systemic VTI:  0.13 m Systemic Diam: 2.00 cm  Olga Millers MD Electronically signed by Olga Millers MD Signature Date/Time: 11/14/2022/3:17:18 PM    Final    MONITORS  CARDIAC EVENT MONITOR 01/30/2018  Narrative Sinus to sinus tach Olga Millers   CT SCANS  CT CORONARY MORPH W/CTA COR W/SCORE 01/17/2011  Addendum 01/17/2011 12:13 PM ADDENDUM CREATED: 01/17/2011 12:08:08  OVER-READ INTERPRETATION - CT CHEST  The following report is an over-read performed by radiologist Dr. Reyes Ivan, M.D. of [facility] on 01/17/2011 12:08:08.  This over-read does not include interpretation of cardiac or coronary anatomy or pathology.  The CTA interpretation by the cardiologist is attached.  Comparison:  CT chest 07/24/2009  Findings: No pathologically enlarged lymph nodes in the visualized portion of the mediastinum.  Bihilar lymphoid tissue.  Pulmonary arteries are enlarged.  Distal  esophagus is mildly dilated. Gastric band is seen in the proximal stomach.  Minimal scarring in the lingula.  Visualized portions of the lungs are otherwise clear.  No pleural fluid.  Visualized portions of the airway and upper abdomen are unremarkable as well.  No worrisome lytic or sclerotic  lesions.  IMPRESSION:  1.  No acute findings. 2.  Pulmonary arterial hypertension.  **END ADDENDUM** SIGNED BY: Reyes Ivan, M.D.  Narrative *RADIOLOGY REPORT*  CARDIAC CTA WITH CALCIUM SCORE 01/16/2011 13:47:00  Ordering Physician: PETER [place]  Reading Physician: Laurey Morale  Protocol:  The patient scanned on a Philips 256 slice scanner. Prospective gating with 5% phase tolerance at 120 kV was used. Average heart rate during the scan was 55 beats per minute.  After an initial AP and lateral topogram, 3 mm axial slices were performed through the heart for calcium scoring.  The patient then had a 80 ml of contrast given for coronary CTA using bolus tracking from the descending thoracic aorta.  The 3-D data set was then sent to the [employer].  Reconstructions were done using MIP,MPR and VRT modes.  Indications: Chest pain, abnormal myoview.  DETAILED FINDINGS:  Quality of Study: Good  Left Main: No plaque or stenosis  Left Anterior Descending: No plaque or stenosis.  Distal LAD was not well-visualized  Left Circumflex: No plaque or stenosis.  There was a large ramus intermedius.  Right Coronary Artery: Dominant vessel with no plaque or stenosis. The distal PDA was not well-visualized.  Coronary Calcium Score: 0 Agatston units  IMPRESSION: 1. Coronary calcium score = 0, placing patient in a low risk group.  2.  No plaque or stenosis noted in the coronary tree.  The distal LAD and distal PDA were not well-visualized.  3.  Noncardiac findings to be reported by [facility].  Original Report Authenticated By: Reyes Ivan, M.D.         Recent Labs: 07/12/2022: ALT 13; Pro B Natriuretic peptide (BNP) 94.0 12/24/2022: TSH 10.66 02/27/2023: B Natriuretic Peptide 41.5; BUN 21; Creatinine, Ser 1.41; Hemoglobin 12.1; Platelets 137; Potassium 4.5; Sodium 128  Recent Lipid Panel    Component Value Date/Time   CHOL 127 12/24/2022 1032    CHOL 193 02/17/2020 1116   TRIG 135.0 12/24/2022 1032   HDL 33.90 (L) 12/24/2022 1032   HDL 48 02/17/2020 1116   CHOLHDL 4 12/24/2022 1032   VLDL 27.0 12/24/2022 1032   LDLCALC 66 12/24/2022 1032   LDLCALC 55 05/03/2020 1517    History of Present Illness    67 year old female with the above past medical history including chronic diastolic heart failure, paroxysmal SVT, prior CVA, COPD with pulmonary hypertension, hypertension, hyperlipidemia, poorly controlled type 2 diabetes, CKD stage III, hypothyroidism, OSA not on CPAP, cirrhosis, fibromyalgia, polymyalgia rheumatica, and chronic pain syndrome on narcotics.  Cardiac catheterization in 2006 revealed normal coronary arteries.  Coronary CTA in 12/2010 showed coronary calcium score of 0, no evidence of plaque or stenosis noted in the coronary tree but distal LAD and distal PDA were not well-visualized.  Event monitor in 2019 showed no significant arrhythmias. Echo in 06/2019 showed EF 60 to 65%, G1 DD, normal RV, no significant valvular disease.  Most recent echocardiogram in 10/2022 showed EF greater than 75%, normal wall motion, mild LVH, no significant valvular disease.  Over the past 6 months she has had worsening dyspnea on exertion.  She has also struggled with labile BP.  Lopressor was switched to Toprol XL.  Untreated sleep apnea was felt to be likely contributing to her elevated BP.  She has not tolerated CPAP in the past due to claustrophobia.  She was last seen in the office on 02/26/2023 and noted weight gain of 20 pounds in the past 2 months, dyspnea on exertion, increased bilateral lower extremity edema.  Her oxygen saturation was 87% on arrival, improved to 90% with 2 L O2.  She has COPD with chronic hypoxic respiratory failure and is on nightly oxygen.  Fluid volume status was somewhat difficult to assess in the setting of morbid obesity.  It was felt that she would benefit from IV Lasix but patient preferred to avoid ED evaluation.   Therefore, she was treated with Furosix and given strict ED precautions.  She was also advised to follow-up with pulmonology given concern for worsening hypoxia.  BNP was normal.  BMET revealed potassium of 6.0.  She was advised to go to the ED and was evaluated in the ED on 02/27/2023.  Repeat potassium was 4.5.  It was felt that her elevated potassium was likely in the setting of hemolysis.  Her glucose was noted to be elevated at 503.  Creatinine was stable at 1.41.  Fluid volume status was felt to be stable.  She was discharged home in stable condition.  She presents today for follow-up.  Since her last visit she has been stable from a cardiac standpoint.  She continues to have increased oxygen demands.  Her weight is down 5 pounds since her OV two days ago.  She has mild nonpitting bilateral lower extremity edema.  She denies any worsening dyspnea, edema, PND, orthopnea, or weight gain.  She has not followed up with her pulmonologist.  She notes that prior to her visit earlier this week she was taking Lasix only once a week.  She denies chest pain though she does note left upper quadrant pain and plans to follow-up with her PCP for this.  Overall, she reports no significant change in her symptoms.  Home Medications    Current Outpatient Medications  Medication Sig Dispense Refill   albuterol (VENTOLIN HFA) 108 (90 Base) MCG/ACT inhaler TAKE 2 PUFFS BY MOUTH EVERY 6 HOURS AS NEEDED FOR WHEEZE OR SHORTNESS OF BREATH 18 g 11   Albuterol Sulfate (PROAIR RESPICLICK) 108 (90 Base) MCG/ACT AEPB 1 puff as needed Inhalation every 4 hrs     amLODipine (NORVASC) 5 MG tablet TAKE ONE TABLET BY MOUTH AT BREAKFAST AND AT BEDTIME 60 tablet 5   [START ON 03/02/2023] Buprenorphine HCl 150 MCG FILM Place inside cheek.     celecoxib (CELEBREX) 100 MG capsule Take 100 mg by mouth 2 (two) times daily.     cloNIDine (CATAPRES) 0.1 MG tablet Take 0.1 mg by mouth every 8 (eight) hours as needed.     colchicine 0.6 MG tablet  Take one tab bid x 2 days and then daily 10 tablet 0   Continuous Blood Gluc Sensor (FREESTYLE LIBRE 3 SENSOR) MISC 1 each by Does not apply route every 14 (fourteen) days. 6 each 3   ergocalciferol (VITAMIN D2) 1.25 MG (50000 UT) capsule Take 50,000 Units by mouth once a week. Monday     fexofenadine (ALLEGRA) 180 MG tablet Take 180 mg by mouth daily as needed for allergies or rhinitis.     folic acid (FOLVITE) 1 MG tablet Take 1 mg by mouth daily.     gabapentin (NEURONTIN) 300 MG capsule Take 300 mg by mouth 3 (three) times  daily as needed (pain).     hydrALAZINE (APRESOLINE) 25 MG tablet TAKE ONE TABLET BY MOUTH AT BREAKFAST AND AT BEDTIME May take extra tab if needed if bp over 170/80 90 tablet 2   hydrOXYzine (VISTARIL) 50 MG capsule Take 50 mg by mouth 2 (two) times daily as needed for itching or anxiety.     Insulin Pen Needle 32G X 4 MM MISC Use 3x a day 300 each 3   insulin regular human CONCENTRATED (HUMULIN R U-500 KWIKPEN) 500 UNIT/ML KwikPen Inject under skin 30 min before meals - total of 200 units a day as advised 18 mL 3   Insulin Syringe-Needle U-100 26G X 1/2" 1 ML MISC Use daily for insulin injection as directed 100 each 3   Ipratropium-Albuterol (COMBIVENT RESPIMAT) 20-100 MCG/ACT AERS respimat Inhale 1 puff into the lungs every 6 (six) hours as needed for wheezing. 4 g 5   ipratropium-albuterol (DUONEB) 0.5-2.5 (3) MG/3ML SOLN Take 3 mLs by nebulization every 6 (six) hours as needed (Wheezing or dyspnea.). 360 mL 11   levothyroxine (SYNTHROID) 175 MCG tablet TAKE ONE TABLET BY MOUTH BEFORE BREAKFAST 90 tablet 3   metFORMIN (GLUCOPHAGE) 500 MG tablet TAKE ONE TABLET BY MOUTH EVERY MORNING and TAKE ONE TABLET BY MOUTH EVERYDAY AT BEDTIME 180 tablet 3   metoprolol succinate (TOPROL-XL) 25 MG 24 hr tablet Take 1 tablet (25 mg total) by mouth at bedtime. Take with or immediately following a meal. 90 tablet 3   OXYGEN Inhale 3 L into the lungs at bedtime.     PARoxetine (PAXIL) 40 MG  tablet Take 1 tablet (40 mg total) by mouth daily. 90 tablet 1   promethazine (PHENERGAN) 25 MG tablet TAKE 1/2 TABLET BY MOUTH EVERY 8 HOURS AS NEEDED FOR NAUSEA AND VOMITING 30 tablet 0   Propylene Glycol (SYSTANE BALANCE) 0.6 % SOLN Place 1 drop into both eyes daily as needed (dry eyes).     Semaglutide, 1 MG/DOSE, (OZEMPIC, 1 MG/DOSE,) 4 MG/3ML SOPN Inject 1 mg into the skin once a week. 9 mL 3   simvastatin (ZOCOR) 20 MG tablet Take 1 tablet (20 mg total) by mouth daily at 6 PM. 90 tablet 3   tiZANidine (ZANAFLEX) 4 MG tablet Take 4 mg by mouth every 8 (eight) hours as needed for muscle spasms.     Adalimumab (HUMIRA PEN) 40 MG/0.4ML PNKT Inject 40 mg into the skin every 14 (fourteen) days. Every other Thursday (Patient not taking: Reported on 02/28/2023)     buprenorphine (BUTRANS) 20 MCG/HR PTWK 1 patch once a week. (Patient not taking: Reported on 02/28/2023)     busPIRone (BUSPAR) 15 MG tablet Take 15 mg by mouth daily as needed (for anxiety). (Patient not taking: Reported on 02/28/2023)     Furosemide (FUROSCIX) 80 MG/10ML CTKT Inject 1 Dose into the skin once for 1 dose. 1 each 0   furosemide (LASIX) 40 MG tablet Take 1 tablet (40 mg total) by mouth every morning. 90 tablet 3   hydrOXYzine (ATARAX) 50 MG tablet Take 50 mg by mouth at bedtime as needed. (Patient not taking: Reported on 02/28/2023)     potassium chloride SA (KLOR-CON M20) 20 MEQ tablet Take 2 tab daily 180 tablet 3   traZODone (DESYREL) 150 MG tablet Take 150 mg by mouth at bedtime as needed for sleep. (Patient not taking: Reported on 02/28/2023)     No current facility-administered medications for this visit.     Review of Systems  She denies chest pain, palpitation, pnd, n, v, dizziness, syncope, weight gain, or early satiety. All other systems reviewed and are otherwise negative except as noted above.   Physical Exam    VS:  BP (!) 140/70   Pulse 97   Ht 5\' 9"  (1.753 m)   Wt (!) 317 lb (143.8 kg)   BMI 46.81 kg/m    GEN: Well nourished, well developed, in no acute distress. HEENT: normal. Neck: Supple, no JVD, carotid bruits, or masses. Cardiac: RRR, no murmurs, rubs, or gallops. No clubbing, cyanosis, trace nonpitting bilateral lower extremity edema.  Radials/DP/PT 2+ and equal bilaterally.  Respiratory:  Respirations regular and unlabored, clear to auscultation bilaterally. GI: Soft, nontender, nondistended, BS + x 4. MS: no deformity or atrophy. Skin: warm and dry, no rash. Neuro:  Strength and sensation are intact. Psych: Normal affect.  Accessory Clinical Findings    ECG personally reviewed by me today -    No EKG in office today.   Lab Results  Component Value Date   WBC 10.4 02/27/2023   HGB 12.1 02/27/2023   HCT 40.8 02/27/2023   MCV 78.3 (L) 02/27/2023   PLT 137 (L) 02/27/2023   Lab Results  Component Value Date   CREATININE 1.41 (H) 02/27/2023   BUN 21 02/27/2023   NA 128 (L) 02/27/2023   K 4.5 02/27/2023   CL 92 (L) 02/27/2023   CO2 25 02/27/2023   Lab Results  Component Value Date   ALT 13 07/12/2022   AST 23 07/12/2022   ALKPHOS 75 07/12/2022   BILITOT 0.5 07/12/2022   Lab Results  Component Value Date   CHOL 127 12/24/2022   HDL 33.90 (L) 12/24/2022   LDLCALC 66 12/24/2022   TRIG 135.0 12/24/2022   CHOLHDL 4 12/24/2022    Lab Results  Component Value Date   HGBA1C 12.1 (A) 12/24/2022    Assessment & Plan    1. Chronic diastolic heart failure: Most recent echocardiogram in 10/2022 showed EF greater than 75%, normal wall motion, mild LVH, no significant valvular disease.  She was last seen in the office on 02/26/2023 and noted weight gain of 20 pounds in the past 2 months, dyspnea on exertion, increased bilateral lower extremity edema.  She was treated with Furosix.  Her weight is down 5 pounds.  She notes that prior to her visit earlier this week she was only taking her Lasix once a week at the most.  Generally euvolemic on exam.  She continues to have increased  oxygen demands I am not sure that this is solely due to fluid volume status.  I suspect this is multifactorial in the setting of COPD, possible obesity hypoventilation syndrome.  I advised her to take Lasix 40 mg daily.  I will have her take potassium daily.  Will plan for close follow-up in 1 week.  Will plan for repeat BMET at that time.  Reviewed ED precautions, sodium and fluid recommendations.  Continue metoprolol, Lasix as above.  2. PSVT: Denies any recent palpitations.  Continue metoprolol.  3. Hypertension: BP slightly elevated, generally well controlled.  Continue to monitor.  For now, continue current antihypertensive regimen.   4. Hyperlipidemia: LDL was 66 in 11/2022.  Continue simvastatin.  5. Type 2 diabetes: A1c was 12.3 in 12/2022.  Follows with endocrinology.  6. CKD stage III: Creatinine was 1.41 on 02/27/2023.  Will plan to repeat BMET at next follow-up visit.  7. Hyperkalemia: This was noted on most recent labs but  was felt to be due to hemolysis.  Repeat potassium was 4.5.  Stable. Will plan for repeat labs in 1 week.  8. COPD/chronic hypoxic respiratory failure: On home O2, recommend follow-up with pulmonology in the setting of increased oxygen requirements.   9. Disposition: Follow-up in 1 week.  Joylene Grapes, NP 02/28/2023, 4:56 PM

## 2023-03-05 ENCOUNTER — Telehealth: Payer: Self-pay

## 2023-03-05 NOTE — Telephone Encounter (Signed)
Transition Care Management Follow-up Telephone Call Date of discharge and from where: 02/27/2023 [facility] How have you been since you were released from the hospital? Patient is feeling better Any questions or concerns? No  Items Reviewed: Did the pt receive and understand the discharge instructions provided? Yes  Medications obtained and verified? Yes  Other? No  Any new allergies since your discharge? No  Dietary orders reviewed? Yes Do you have support at home? Yes   Follow up appointments reviewed:  PCP Hospital f/u appt confirmed? No  Scheduled to see  on  @ . Specialist Hospital f/u appt confirmed? Yes  Scheduled to see Joylene Grapes, NP on 02/28/2023 @ [facility]. Are transportation arrangements needed? No  If their condition worsens, is the pt aware to call PCP or go to the Emergency Dept.? Yes Was the patient provided with contact information for the PCP's office or ED? Yes Was to pt encouraged to call back with questions or concerns? Yes  Corretta Munce [facility]  [facility] Population Health Community Resource Care Guide   [email or web address]  ?? [phone]   [email or web address]: [email or web address]  [email or web address]

## 2023-03-07 ENCOUNTER — Ambulatory Visit: Payer: Medicare HMO | Admitting: Nurse Practitioner

## 2023-03-07 NOTE — Progress Notes (Deleted)
Office Visit    Patient Name: Mary Acosta Date of Encounter: 03/07/2023  Primary Care Provider:  Pincus Sanes, MD Primary Cardiologist:  Olga Millers, MD  Chief Complaint    67 year old female with a history of chronic diastolic heart failure, paroxysmal SVT, prior CVA, COPD with pulmonary hypertension, hypertension, hyperlipidemia, poorly controlled type 2 diabetes, CKD stage III, hypothyroidism, OSA not on CPAP, cirrhosis, fibromyalgia, polymyalgia rheumatica, and chronic pain syndrome on narcotics who presents today for follow-up related to heart failure.   Past Medical History    Past Medical History:  Diagnosis Date   AKI (acute kidney injury) (HCC) 01/2017   Allergy    seasonal   Anxiety    with panic attacks   Arthritis    "back; feet; hands; shoulders" (08/26/2014)   Asthma    Cataract    forming   Cervical cancer (HCC)    Chronic lower back pain    Chronic narcotic use    Chronic pain syndrome    [facility]   Cirrhosis (HCC)    Clostridium difficile infection 2017   COPD (chronic obstructive pulmonary disease) (HCC)    Daily headache    past hx   Depression    Diabetic neuropathy (HCC) 06/04/2017   feet  and legs , some in hands   DJD (degenerative joint disease)    Fatty liver disease, nonalcoholic    Fibromyalgia    Frequency of urination    HCAP (healthcare-associated pneumonia) 08/26/2014   History of TIA (transient ischemic attack) 11/01/2010   NO RESIDUAL   Hyperlipidemia    Hypertension    Hypothyroidism    IDDM (insulin dependent diabetes mellitus)    Insomnia    Lumbar stenosis    Macular degeneration    Memory difficulty 07/25/2016   Nocturia    OSA (obstructive sleep apnea)    NO CPAP SINCE WT LOSS   Osteoarthritis    with severe disease in knee   Oxygen deficiency    4  liters at bedtime only   Pneumonia "several times"   Polymyalgia rheumatica (HCC)    Scoliosis    Seasonal allergies    Stroke [facility])     TIA   Thyroid cancer (HCC)    Urgency of urination    Vaginal pain S/P SLING  FEB 2012   Past Surgical History:  Procedure Laterality Date   APPENDECTOMY  1982   BREAST EXCISIONAL BIOPSY Left 02/28/2005   Atypical Ductal Hyperplasia   CARDIAC CATHETERIZATION  09/04/2004   NORMAL CORONARY ANATOMY/ NORMAL LVF/ EF 60%   CARDIOVASCULAR STRESS TEST  12-27-2010  DR [place]   ABNORMAL NUCLEAR STUDY W/ /MILD INFERIOR ISCHEMIA/ EF 69%/  CT HEART ANGIOGRAM ;  NO ACUTE FINDINGS   COLONOSCOPY     CRYOABLATION  05/16/2003   w/LEEP FOR ABNORMAL PAP SMEAR   CYSTOSCOPY  05/18/2012   Procedure: CYSTOSCOPY;  Surgeon: Martina Sinner, MD;  Location: [facility];  Service: Urology;  Laterality: N/A;  examination under anethesia   ESOPHAGOGASTRODUODENOSCOPY (EGD) WITH PROPOFOL N/A 09/03/2016   Procedure: ESOPHAGOGASTRODUODENOSCOPY (EGD) WITH PROPOFOL;  Surgeon: Sherrilyn Rist, MD;  Location: [facility];  Service: Gastroenterology;  Laterality: N/A;   ESOPHAGOGASTRODUODENOSCOPY (EGD) WITH PROPOFOL N/A 11/19/2021   Procedure: ESOPHAGOGASTRODUODENOSCOPY (EGD) WITH PROPOFOL;  Surgeon: Sherrilyn Rist, MD;  Location: [facility];  Service: Gastroenterology;  Laterality: N/A;  Varices screeing, cirrhosis   HYSTEROSCOPY WITH D & C  08/19/2007  PMB   KNEE ARTHROSCOPY W/ DEBRIDEMENT Left 03/29/2006   INTERNAL DERANGEMENT/ SEVERE DJD/ MENISCUS TEARS   LAPAROSCOPIC CHOLECYSTECTOMY  06/10/2002   LAPAROSCOPIC GASTRIC BANDING  03/01/2006   TRUNCAL VAGOTOMY/ PLACEMENT OF VG BAND   REVISION TOTAL KNEE ARTHROPLASTY Left 08-29-2008; 05/2009   TONSILLECTOMY  1969   TOTAL KNEE ARTHROPLASTY Left 01/23/2007   SEVERE DJD   TOTAL THYROIDECTOMY  11/22/2005   BILATERAL THYROID NODULES-- PAPILLARY CARCINOMA (0.5CM)/ ADENOMATOID NODULES   TRANSTHORACIC ECHOCARDIOGRAM  12/27/2010   LVSF NORMAL / EF 55-65%/ GRADE I DIASTOLIC DYSFUNCTION/ MILD MITRAL REGURG. / MILDLY DILATED LEFT ATRIUM/ MILDY  INCREASED SYSTOLIC PRESSURE OF PULMONARY ARTERIES   TRANSVAGINAL SUBURETERAL TAPE/ SLING  09/28/2010   MIXED URINARY INCONTINENCE   TUBAL LIGATION  1983    Allergies  Allergies  Allergen Reactions   Gabapentin Swelling    Swelling in legs    Losartan Other (See Comments)    Myalgias and muscle cramping    Aricept [Donepezil Hcl]     Nausea/vomiting, low BP   Aripiprazole Other (See Comments)    Patient reports Hypoglycemia   Hydroxyzine     hallucinations   Oxycodone-Acetaminophen Itching   Oxycodone Itching   Sulfa Antibiotics Nausea Only and Rash   Sulfonamide Derivatives Nausea Only and Rash     Labs/Other Studies Reviewed    The following studies were reviewed today:  Cardiac Studies & Procedures       ECHOCARDIOGRAM  ECHOCARDIOGRAM COMPLETE 11/14/2022  Narrative ECHOCARDIOGRAM REPORT    Patient Name:   Mary Acosta Date of Exam: 11/14/2022 Medical Rec #:  [id number]             Height:       69.0 in Accession #:    [id number]            Weight:       303.6 lb Date of Birth:  1955-10-13            BSA:          2.467 m Patient Age:    66 years              BP:           112/67 mmHg Patient Gender: F                     HR:           124 bpm. Exam Location:  [address]  Procedure: 2D Echo, Cardiac Doppler, Color Doppler and Intracardiac Opacification Agent  Indications:    R06.00 Dyspnea  History:        Patient has prior history of Echocardiogram examinations, most recent 07/07/2019. CHF, COPD and Stroke; Risk Factors:Hypertension, Dyslipidemia, Former Smoker and Sleep Apnea. Asthma. SVT. Fibromyalgia.  Sonographer:    Cathie Beams RCS Referring Phys: [id number] Mary Acosta  IMPRESSIONS   1. Left ventricular ejection fraction, by estimation, is >75%. The left ventricle has hyperdynamic function. The left ventricle has no regional wall motion abnormalities. There is mild left ventricular hypertrophy. Left ventricular diastolic  parameters are indeterminate. 2. Right ventricular systolic function is normal. The right ventricular size is normal. 3. The mitral valve is normal in structure. No evidence of mitral valve regurgitation. No evidence of mitral stenosis. 4. The aortic valve has an indeterminant number of cusps. Aortic valve regurgitation is not visualized. No aortic stenosis is present.  FINDINGS Left Ventricle: Left ventricular ejection fraction, by estimation, is >75%. The  left ventricle has hyperdynamic function. The left ventricle has no regional wall motion abnormalities. Definity contrast agent was given IV to delineate the left ventricular endocardial borders. The left ventricular internal cavity size was normal in size. There is mild left ventricular hypertrophy. Left ventricular diastolic parameters are indeterminate.  Right Ventricle: The right ventricular size is normal. Right ventricular systolic function is normal.  Left Atrium: Left atrial size was normal in size.  Right Atrium: Right atrial size was normal in size.  Pericardium: There is no evidence of pericardial effusion.  Mitral Valve: The mitral valve is normal in structure. Mild mitral annular calcification. No evidence of mitral valve regurgitation. No evidence of mitral valve stenosis.  Tricuspid Valve: The tricuspid valve is normal in structure. Tricuspid valve regurgitation is trivial. No evidence of tricuspid stenosis.  Aortic Valve: The aortic valve has an indeterminant number of cusps. Aortic valve regurgitation is not visualized. No aortic stenosis is present.  Pulmonic Valve: The pulmonic valve was not well visualized. Pulmonic valve regurgitation is not visualized. No evidence of pulmonic stenosis.  Aorta: The aortic root is normal in size and structure.  Venous: The inferior vena cava was not well visualized.  IAS/Shunts: No atrial level shunt detected by color flow Doppler.   LEFT VENTRICLE PLAX 2D LVIDd:         2.30  cm LVIDs:         1.70 cm LV PW:         1.20 cm LV IVS:        1.40 cm LVOT diam:     2.00 cm LV SV:         41 LV SV Index:   17 LVOT Area:     3.14 cm   RIGHT VENTRICLE RV Basal diam:  2.70 cm RV S prime:     11.26 cm/s TAPSE (M-mode): 0.7 cm  LEFT ATRIUM             Index        RIGHT ATRIUM           Index LA diam:        3.40 cm 1.38 cm/m   RA Area:     16.50 cm LA Vol (A2C):   52.4 ml 21.24 ml/m  RA Volume:   41.70 ml  16.90 ml/m LA Vol (A4C):   48.7 ml 19.74 ml/m LA Biplane Vol: 50.3 ml 20.39 ml/m AORTIC VALVE LVOT Vmax:   83.80 cm/s LVOT Vmean:  57.700 cm/s LVOT VTI:    0.132 m  AORTA Ao Root diam: 3.50 cm Ao Asc diam:  3.70 cm   SHUNTS Systemic VTI:  0.13 m Systemic Diam: 2.00 cm  Olga Millers MD Electronically signed by Olga Millers MD Signature Date/Time: 11/14/2022/3:17:18 PM    Final    MONITORS  CARDIAC EVENT MONITOR 12/29/2017  Narrative Sinus to sinus tach Olga Millers   CT SCANS  CT CORONARY MORPH W/CTA COR W/SCORE 01/16/2011  Addendum 01/17/2011 12:13 PM ***ADDENDUM*** CREATED: 01/17/2011 12:08:08  OVER-READ INTERPRETATION - CT CHEST  The following report is an over-read performed by radiologist Dr. Reyes Ivan, M.D. of [facility] on 01/17/2011 12:08:08.  This over-read does not include interpretation of cardiac or coronary anatomy or pathology.  The CTA interpretation by the cardiologist is attached.  Comparison:  CT chest 07/24/2009  Findings: No pathologically enlarged lymph nodes in the visualized portion of the mediastinum.  Bihilar lymphoid tissue.  Pulmonary arteries are enlarged.  Distal  esophagus is mildly dilated. Gastric band is seen in the proximal stomach.  Minimal scarring in the lingula.  Visualized portions of the lungs are otherwise clear.  No pleural fluid.  Visualized portions of the airway and upper abdomen are unremarkable as well.  No worrisome lytic or sclerotic  lesions.  IMPRESSION:  1.  No acute findings. 2.  Pulmonary arterial hypertension.  ***END ADDENDUM*** SIGNED BY: Reyes Ivan, M.D.  Narrative *RADIOLOGY REPORT*  CARDIAC CTA WITH CALCIUM SCORE 01/16/2011 13:47:00  Ordering Physician: PETER [place]  Reading Physician: Laurey Morale  Protocol:  The patient scanned on a Philips 256 slice scanner. Prospective gating with 5% phase tolerance at 120 kV was used. Average heart rate during the scan was 55 beats per minute.  After an initial AP and lateral topogram, 3 mm axial slices were performed through the heart for calcium scoring.  The patient then had a 80 ml of contrast given for coronary CTA using bolus tracking from the descending thoracic aorta.  The 3-D data set was then sent to the [employer].  Reconstructions were done using MIP,MPR and VRT modes.  Indications: Chest pain, abnormal myoview.  DETAILED FINDINGS:  Quality of Study: Good  Left Main: No plaque or stenosis  Left Anterior Descending: No plaque or stenosis.  Distal LAD was not well-visualized  Left Circumflex: No plaque or stenosis.  There was a large ramus intermedius.  Right Coronary Artery: Dominant vessel with no plaque or stenosis. The distal PDA was not well-visualized.  Coronary Calcium Score: 0 Agatston units  IMPRESSION: 1. Coronary calcium score = 0, placing patient in a low risk group.  2.  No plaque or stenosis noted in the coronary tree.  The distal LAD and distal PDA were not well-visualized.  3.  Noncardiac findings to be reported by [facility].  Original Report Authenticated By: Reyes Ivan, M.D.         Recent Labs: 07/12/2022: ALT 13; Pro B Natriuretic peptide (BNP) 94.0 12/24/2022: TSH 10.66 02/27/2023: B Natriuretic Peptide 41.5; BUN 21; Creatinine, Ser 1.41; Hemoglobin 12.1; Platelets 137; Potassium 4.5; Sodium 128  Recent Lipid Panel    Component Value Date/Time   CHOL 127 12/24/2022 1032    CHOL 193 02/17/2020 1116   TRIG 135.0 12/24/2022 1032   HDL 33.90 (L) 12/24/2022 1032   HDL 48 02/17/2020 1116   CHOLHDL 4 12/24/2022 1032   VLDL 27.0 12/24/2022 1032   LDLCALC 66 12/24/2022 1032   LDLCALC 55 05/03/2020 1517    History of Present Illness    67 year old female with the above past medical history including chronic diastolic heart failure, paroxysmal SVT, prior CVA, COPD with pulmonary hypertension, hypertension, hyperlipidemia, poorly controlled type 2 diabetes, CKD stage III, hypothyroidism, OSA not on CPAP, cirrhosis, fibromyalgia, polymyalgia rheumatica, and chronic pain syndrome on narcotics.   Cardiac catheterization in 2006 revealed normal coronary arteries.  Coronary CTA in 12/2010 showed coronary calcium score of 0, no evidence of plaque or stenosis noted in the coronary tree but distal LAD and distal PDA were not well-visualized.  Event monitor in 2019 showed no significant arrhythmias. Echo in 06/2019 showed EF 60 to 65%, G1 DD, normal RV, no significant valvular disease.  Most recent echocardiogram in 10/2022 showed EF greater than 75%, normal wall motion, mild LVH, no significant valvular disease.  Over the past 6 months she has had worsening dyspnea on exertion.  She has also struggled with labile BP.  Lopressor was switched to Toprol XL.  Untreated sleep apnea was felt to be likely contributing to her elevated BP.  She has not tolerated CPAP in the past due to claustrophobia.  At a follow-up visit on 02/26/2023 she noted weight gain of 20 pounds in the past 2 months, dyspnea on exertion, increased bilateral lower extremity edema.  Her oxygen saturation was 87% on arrival, improved to 90% with 2 L O2.  She has COPD with chronic hypoxic respiratory failure and is on nightly oxygen.  Fluid volume status was somewhat difficult to assess in the setting of morbid obesity.  It was felt that she would benefit from IV Lasix but patient preferred to avoid ED evaluation.  Therefore,  she was treated with Furosix and given strict ED precautions.  She was also advised to follow-up with pulmonology given concern for worsening hypoxia.  BNP was normal.  BMET revealed potassium of 6.0.  She was advised to go to the ED and was evaluated in the ED on 02/27/2023.  Repeat potassium was 4.5.  It was felt that her elevated potassium was likely in the setting of hemolysis.  Her glucose was noted to be elevated at 503.  Creatinine was stable at 1.41.  Fluid volume status was felt to be stable.  She was discharged home in stable condition.  She was last seen in the office on 02/28/2023 and was stable from a cardiac standpoint.  She continued to have increased oxygen demands.  Weight was down.  She had nonpitting bilateral lower extremity edema, denies worsening dyspnea.  She notes that prior to her visit earlier in the week she had only been taking her Lasix only once a week.  Ongoing adherence to Lasix was advised with plans for close follow-up.   She presents today for follow-up.  Since her last visit she   1. Chronic diastolic heart failure: Most recent echocardiogram in 10/2022 showed EF greater than 75%, normal wall motion, mild LVH, no significant valvular disease.  She was last seen in the office on 02/26/2023 and noted weight gain of 20 pounds in the past 2 months, dyspnea on exertion, increased bilateral lower extremity edema.  She was treated with Furosix.  Her weight is down 5 pounds.  She notes that prior to her visit earlier this week she was only taking her Lasix once a week at the most.  Generally euvolemic on exam.  She continues to have increased oxygen demands I am not sure that this is solely due to fluid volume status.  I suspect this is multifactorial in the setting of COPD, possible obesity hypoventilation syndrome.  I advised her to take Lasix 40 mg daily.  I will have her take potassium daily.  Will plan for close follow-up in 1 week.  Will plan for repeat BMET at that time.  Reviewed ED  precautions, sodium and fluid recommendations.  Continue metoprolol, Lasix as above.   2. PSVT: Denies any recent palpitations.  Continue metoprolol.   3. Hypertension: BP slightly elevated, generally well controlled.  Continue to monitor.  For now, continue current antihypertensive regimen.    4. Hyperlipidemia: LDL was 66 in 11/2022.  Continue simvastatin.   5. Type 2 diabetes: A1c was 12.3 in 12/2022.  Follows with endocrinology.   6. CKD stage III: Creatinine was 1.41 on 02/27/2023.  Will plan to repeat BMET at next follow-up visit.   7. Hyperkalemia: This was noted on most recent labs but was felt to be due to hemolysis.  Repeat potassium was 4.5.  Stable.  Will plan for repeat labs in 1 week.   8. COPD/chronic hypoxic respiratory failure: On home O2, recommend follow-up with pulmonology in the setting of increased oxygen requirements.    9. Disposition: Follow-up in Home Medications    Current Outpatient Medications  Medication Sig Dispense Refill   Adalimumab (HUMIRA PEN) 40 MG/0.4ML PNKT Inject 40 mg into the skin every 14 (fourteen) days. Every other Thursday (Patient not taking: Reported on 02/28/2023)     albuterol (VENTOLIN HFA) 108 (90 Base) MCG/ACT inhaler TAKE 2 PUFFS BY MOUTH EVERY 6 HOURS AS NEEDED FOR WHEEZE OR SHORTNESS OF BREATH 18 g 11   Albuterol Sulfate (PROAIR RESPICLICK) 108 (90 Base) MCG/ACT AEPB 1 puff as needed Inhalation every 4 hrs     amLODipine (NORVASC) 5 MG tablet TAKE ONE TABLET BY MOUTH AT BREAKFAST AND AT BEDTIME 60 tablet 5   buprenorphine (BUTRANS) 20 MCG/HR PTWK 1 patch once a week. (Patient not taking: Reported on 02/28/2023)     Buprenorphine HCl 150 MCG FILM Place inside cheek.     busPIRone (BUSPAR) 15 MG tablet Take 15 mg by mouth daily as needed (for anxiety). (Patient not taking: Reported on 02/28/2023)     celecoxib (CELEBREX) 100 MG capsule Take 100 mg by mouth 2 (two) times daily.     cloNIDine (CATAPRES) 0.1 MG tablet Take 0.1 mg by mouth every 8  (eight) hours as needed.     colchicine 0.6 MG tablet Take one tab bid x 2 days and then daily 10 tablet 0   Continuous Blood Gluc Sensor (FREESTYLE LIBRE 3 SENSOR) MISC 1 each by Does not apply route every 14 (fourteen) days. 6 each 3   ergocalciferol (VITAMIN D2) 1.25 MG (50000 UT) capsule Take 50,000 Units by mouth once a week. Monday     fexofenadine (ALLEGRA) 180 MG tablet Take 180 mg by mouth daily as needed for allergies or rhinitis.     folic acid (FOLVITE) 1 MG tablet Take 1 mg by mouth daily.     Furosemide (FUROSCIX) 80 MG/10ML CTKT Inject 1 Dose into the skin once for 1 dose. 1 each 0   furosemide (LASIX) 40 MG tablet Take 1 tablet (40 mg total) by mouth every morning. 90 tablet 3   gabapentin (NEURONTIN) 300 MG capsule Take 300 mg by mouth 3 (three) times daily as needed (pain).     hydrALAZINE (APRESOLINE) 25 MG tablet TAKE ONE TABLET BY MOUTH AT BREAKFAST AND AT BEDTIME May take extra tab if needed if bp over 170/80 90 tablet 2   hydrOXYzine (ATARAX) 50 MG tablet Take 50 mg by mouth at bedtime as needed. (Patient not taking: Reported on 02/28/2023)     hydrOXYzine (VISTARIL) 50 MG capsule Take 50 mg by mouth 2 (two) times daily as needed for itching or anxiety.     Insulin Pen Needle 32G X 4 MM MISC Use 3x a day 300 each 3   insulin regular human CONCENTRATED (HUMULIN R U-500 KWIKPEN) 500 UNIT/ML KwikPen Inject under skin 30 min before meals - total of 200 units a day as advised 18 mL 3   Insulin Syringe-Needle U-100 26G X 1/2" 1 ML MISC Use daily for insulin injection as directed 100 each 3   Ipratropium-Albuterol (COMBIVENT RESPIMAT) 20-100 MCG/ACT AERS respimat Inhale 1 puff into the lungs every 6 (six) hours as needed for wheezing. 4 g 5   ipratropium-albuterol (DUONEB) 0.5-2.5 (3) MG/3ML SOLN Take 3 mLs by nebulization every 6 (six) hours as needed (  Wheezing or dyspnea.). 360 mL 11   levothyroxine (SYNTHROID) 175 MCG tablet TAKE ONE TABLET BY MOUTH BEFORE BREAKFAST 90 tablet 3    metFORMIN (GLUCOPHAGE) 500 MG tablet TAKE ONE TABLET BY MOUTH EVERY MORNING and TAKE ONE TABLET BY MOUTH EVERYDAY AT BEDTIME 180 tablet 3   metoprolol succinate (TOPROL-XL) 25 MG 24 hr tablet Take 1 tablet (25 mg total) by mouth at bedtime. Take with or immediately following a meal. 90 tablet 3   OXYGEN Inhale 3 L into the lungs at bedtime.     PARoxetine (PAXIL) 40 MG tablet Take 1 tablet (40 mg total) by mouth daily. 90 tablet 1   potassium chloride SA (KLOR-CON M20) 20 MEQ tablet Take 2 tab daily 180 tablet 3   promethazine (PHENERGAN) 25 MG tablet TAKE 1/2 TABLET BY MOUTH EVERY 8 HOURS AS NEEDED FOR NAUSEA AND VOMITING 30 tablet 0   Propylene Glycol (SYSTANE BALANCE) 0.6 % SOLN Place 1 drop into both eyes daily as needed (dry eyes).     Semaglutide, 1 MG/DOSE, (OZEMPIC, 1 MG/DOSE,) 4 MG/3ML SOPN Inject 1 mg into the skin once a week. 9 mL 3   simvastatin (ZOCOR) 20 MG tablet Take 1 tablet (20 mg total) by mouth daily at 6 PM. 90 tablet 3   tiZANidine (ZANAFLEX) 4 MG tablet Take 4 mg by mouth every 8 (eight) hours as needed for muscle spasms.     traZODone (DESYREL) 150 MG tablet Take 150 mg by mouth at bedtime as needed for sleep. (Patient not taking: Reported on 02/28/2023)     No current facility-administered medications for this visit.     Review of Systems    ***.  All other systems reviewed and are otherwise negative except as noted above.    Physical Exam    VS:  There were no vitals taken for this visit. , BMI There is no height or weight on file to calculate BMI.     GEN: Well nourished, well developed, in no acute distress. HEENT: normal. Neck: Supple, no JVD, carotid bruits, or masses. Cardiac: RRR, no murmurs, rubs, or gallops. No clubbing, cyanosis, edema.  Radials/DP/PT 2+ and equal bilaterally.  Respiratory:  Respirations regular and unlabored, clear to auscultation bilaterally. GI: Soft, nontender, nondistended, BS + x 4. MS: no deformity or atrophy. Skin: warm and  dry, no rash. Neuro:  Strength and sensation are intact. Psych: Normal affect.  Accessory Clinical Findings    ECG personally reviewed by me today -    - no acute changes.   Lab Results  Component Value Date   WBC 10.4 02/27/2023   HGB 12.1 02/27/2023   HCT 40.8 02/27/2023   MCV 78.3 (L) 02/27/2023   PLT 137 (L) 02/27/2023   Lab Results  Component Value Date   CREATININE 1.41 (H) 02/27/2023   BUN 21 02/27/2023   NA 128 (L) 02/27/2023   K 4.5 02/27/2023   CL 92 (L) 02/27/2023   CO2 25 02/27/2023   Lab Results  Component Value Date   ALT 13 07/12/2022   AST 23 07/12/2022   ALKPHOS 75 07/12/2022   BILITOT 0.5 07/12/2022   Lab Results  Component Value Date   CHOL 127 12/24/2022   HDL 33.90 (L) 12/24/2022   LDLCALC 66 12/24/2022   TRIG 135.0 12/24/2022   CHOLHDL 4 12/24/2022    Lab Results  Component Value Date   HGBA1C 12.1 (A) 12/24/2022    Assessment & Plan    1.  ***  No BP recorded.  {Refresh Note OR Click here to enter BP  :1}***   Joylene Grapes, NP 03/07/2023, 6:44 AM

## 2023-03-12 DIAGNOSIS — J449 Chronic obstructive pulmonary disease, unspecified: Secondary | ICD-10-CM | POA: Diagnosis not present

## 2023-03-24 ENCOUNTER — Other Ambulatory Visit: Payer: Self-pay | Admitting: Gastroenterology

## 2023-04-03 DIAGNOSIS — E119 Type 2 diabetes mellitus without complications: Secondary | ICD-10-CM | POA: Diagnosis not present

## 2023-04-12 DIAGNOSIS — J449 Chronic obstructive pulmonary disease, unspecified: Secondary | ICD-10-CM | POA: Diagnosis not present

## 2023-04-22 ENCOUNTER — Other Ambulatory Visit: Payer: Self-pay | Admitting: Gastroenterology

## 2023-04-22 ENCOUNTER — Other Ambulatory Visit: Payer: Self-pay | Admitting: Internal Medicine

## 2023-04-23 ENCOUNTER — Other Ambulatory Visit: Payer: Self-pay

## 2023-04-23 MED ORDER — PROMETHAZINE HCL 25 MG PO TABS: ORAL_TABLET | ORAL | 0 refills | Status: DC

## 2023-04-24 ENCOUNTER — Ambulatory Visit: Payer: Medicare HMO | Admitting: Internal Medicine

## 2023-04-24 ENCOUNTER — Other Ambulatory Visit: Payer: Self-pay | Admitting: Cardiology

## 2023-04-24 NOTE — Progress Notes (Deleted)
Patient ID: Mary Acosta, female   DOB: 1956-04-21, 67 y.o.   MRN: [id number]   HPI: Mary Acosta is a 67 y.o.-year-old female, initially referred by her PCP, Dr. Lawerance Acosta, returning for follow-up for DM2, dx in 2000, insulin-dependent since 2011, uncontrolled, with long-term complications (peripheral neuropathy, gastroparesis, cerebrovascular disease with history of TIA 2012, DKA 2015) and also history of thyroid cancer and postsurgical hypothyroidism.  She previously saw my colleague, Dr. Everardo Acosta, up to 07/2019.  Last visit with me 4 months ago.  Interim hx: No increased urination, blurry vision, nausea, chest pain. She has sero-negative RA >> started MTX and Prednisone >> stopped MTX 2/2 liver dysfunction and was on 15 mg Prednisone >> she had weight gain and very high blood sugars and came off prednisone. She was recently in the emergency room 02/27/2023 with a potassium of 6.  Upon recheck, this was 4.5.  However, glucose was 502.  She was hydrated and given insulin.  DM2: Reviewed HbA1c levels: Lab Results  Component Value Date   HGBA1C 12.1 (A) 12/24/2022   HGBA1C 10.6 (A) 08/08/2022   HGBA1C 11.9 (A) 05/10/2022   HGBA1C 8.1 (A) 10/09/2021   HGBA1C 8.6 (A) 06/04/2021   HGBA1C 6.9 (A) 01/15/2021   HGBA1C 7.6 (A) 10/13/2020   HGBA1C 8.1 (A) 07/05/2020   HGBA1C 10.4 (H) 05/03/2020   HGBA1C 11.0 (H) 10/22/2019   She was previously on: - Basaglar 80 units at bedtime -[place] >> stopped >> once a week >> 50 >> 80 >> 160 units daily >> 80 units twice a day  - Metformin 1000 mg with dinner >> 500 mg 2x a day - Ozempic 0.5 >> 1 mg weekly - - Lyumjev 10-20 >> 20-25 units before meals >> muscle cramps >> decreased: 14 units Prev. On Rybelsus 3 mg before breakfast-see patient assistance pgm  She was on N and R insulin in the past. She was on metformin in the past >> did not work. She was prev. On Toujeo 180 units daily.  Currently on: - Metformin 500 mg 2x a day -  Ozempic 1 mg weekly in a.m.  - U500 - 30 min before the meal: -30 units before cappuccino - 80-100 units before b'fast  >> 60 >> 40-60 units before brunch - - 50-60 units before dinner >> 60 >> 60-80 units before dinner  Pt checks her sugars 4x  times a day - now with CGM (with receiver):  Previously:   Lowest sugar was 226 >> 250; she has hypoglycemia awareness in the 70s. Highest sugar was 500s >> 561 >> Hi.  Glucometer: AccuChek, Prodigy  Pt's meals are: - Breakfast: bacon or sausage and eggs, cereal >> grits, or grilled cheese - Lunch: salad; hot dog, sandwiches >> ham and cheese sandwich or skips - Dinner: meat + veggies  >> larger - Snacks: not so much anymore Previously drinking Kool-Aid, now stopped.  However, she is still having that she is drinking High C juice.   She is status post gastric banding with vagotomy in 2008.  She lost 148 pounds afterwards but then gained more than 100 pounds back.  No h/o pancreatitis or FH of MTC.  No CKD, last BUN/creatinine:  Lab Results  Component Value Date   BUN 21 02/27/2023   BUN 17 02/26/2023   CREATININE 1.41 (H) 02/27/2023   CREATININE 1.27 (H) 02/26/2023  Not on ACE inhibitor/ARB.  + HL; last set of lipids: Lab Results  Component Value Date  CHOL 127 12/24/2022   HDL 33.90 (L) 12/24/2022   LDLCALC 66 12/24/2022   TRIG 135.0 12/24/2022   CHOLHDL 4 12/24/2022  On Zocor 20, Zetia 10.  - last eye exam was in 2023 reportedly: no DR. Stopped IO injections for macular degeneration.  -She has pain, numbness, tingling in her feet.  Last foot exam 12/24/2022.  On ASA 81.  Pt has FH of DM in mother.  Thyroid cancer:  Reviewed her thyroid cancer history: She is status post total thyroidectomy - [place] Sx 2007. 11/22/2005: THYROID, THYROIDECTOMY:   - PAPILLARY THYROID CARCINOMA (0.5 CM).   - ADENOMATOID NODULES.    COMMENT   ONCOLOGY TABLE-THYROID    1. Maximum tumor size (cm): 0.5 cm   2. Tumor  location: Right mid lobe   3. Multifocality: Not identified   4. Histology: Papillary thyroid carcinoma   5. Margins: Negative   6. Capsular invasion: Present   7. Extrathyroidal extension: Not identified   8. Vascular/Lymphatic invasion: Not identified   9. Lymph nodes: N/A   10. TNM code: pT1, pNX, pMX   11. Comments: Examination of the thyroidectomy specimen   grossly demonstrates multiple nodules. Histologic evaluation   demonstrates numerous adenomatoid nodules. One nodule measuring   0.5 cm demonstrates features compatible with papillary thyroid   carcinoma. The surgical margins are negative. (MS:jy) 11/25/05   Neck U/S (10/25/2020): no abnormal neck masses  Postsurgical hypothyroidism:  She takes 175 mcg levothyroxine daily (dose increased 11/2022): - no missed doses anymore - in am - fasting - at least 30 min from b'fast - no calcium - no iron - no multivitamins - no PPIs - not on Biotin  Reviewed her TFTs: Lab Results  Component Value Date   TSH 10.66 (H) 12/24/2022   TSH 4.62 05/10/2022   TSH 0.13 (L) 10/09/2021   TSH 0.51 02/02/2021   TSH 0.14 (L) 08/04/2020   TSH 4.42 05/03/2020   TSH 1.50 10/22/2019   TSH 1.09 07/20/2019   TSH 1.32 04/01/2019   TSH 0.55 09/04/2018   TSH 0.90 12/26/2017   TSH 0.113 (L) 12/08/2017   TSH 0.22 (L) 09/19/2017   TSH 0.62 06/13/2017   TSH 1.43 01/22/2017   Pt denies: - feeling nodules in neck - hoarseness - dysphagia - choking  She also has a history of COPD, OSA, fibromyalgia, depression, fatty liver-cirrhosis, DDD, h/o vb fx, PMR. She is seen in pain clinic in [facility].  She is on Neurontin and amitriptyline. She lost 2 sons: 2020 and 09/2020.    ROS: + see HPI  I reviewed pt's medications, allergies, PMH, social hx, family hx, and changes were documented in the history of present illness. Otherwise, unchanged from my initial visit note.  Past Medical History:  Diagnosis Date   AKI (acute kidney injury) (HCC)  01/2017   Allergy    seasonal   Anxiety    with panic attacks   Arthritis    "back; feet; hands; shoulders" (08/26/2014)   Asthma    Cataract    forming   Cervical cancer (HCC)    Chronic lower back pain    Chronic narcotic use    Chronic pain syndrome    [facility]   Cirrhosis (HCC)    Clostridium difficile infection 2017   COPD (chronic obstructive pulmonary disease) (HCC)    Daily headache    past hx   Depression    Diabetic neuropathy (HCC) 06/04/2017   feet  and  legs , some in hands   DJD (degenerative joint disease)    Fatty liver disease, nonalcoholic    Fibromyalgia    Frequency of urination    HCAP (healthcare-associated pneumonia) 08/26/2014   History of TIA (transient ischemic attack) 11/01/2010   NO RESIDUAL   Hyperlipidemia    Hypertension    Hypothyroidism    IDDM (insulin dependent diabetes mellitus)    Insomnia    Lumbar stenosis    Macular degeneration    Memory difficulty 07/25/2016   Nocturia    OSA (obstructive sleep apnea)    NO CPAP SINCE WT LOSS   Osteoarthritis    with severe disease in knee   Oxygen deficiency    4  liters at bedtime only   Pneumonia "several times"   Polymyalgia rheumatica (HCC)    Scoliosis    Seasonal allergies    Stroke [facility])    TIA   Thyroid cancer (HCC)    Urgency of urination    Vaginal pain S/P SLING  FEB 2012   Past Surgical History:  Procedure Laterality Date   APPENDECTOMY  1982   BREAST EXCISIONAL BIOPSY Left 02/28/2005   Atypical Ductal Hyperplasia   CARDIAC CATHETERIZATION  09/04/2004   NORMAL CORONARY ANATOMY/ NORMAL LVF/ EF 60%   CARDIOVASCULAR STRESS TEST  12-27-2010  DR [place]   ABNORMAL NUCLEAR STUDY W/ /MILD INFERIOR ISCHEMIA/ EF 69%/  CT HEART ANGIOGRAM ;  NO ACUTE FINDINGS   COLONOSCOPY     CRYOABLATION  05/16/2003   w/LEEP FOR ABNORMAL PAP SMEAR   CYSTOSCOPY  05/18/2012   Procedure: CYSTOSCOPY;  Surgeon: Martina Sinner, MD;  Location: [facility];   Service: Urology;  Laterality: N/A;  examination under anethesia   ESOPHAGOGASTRODUODENOSCOPY (EGD) WITH PROPOFOL N/A 09/03/2016   Procedure: ESOPHAGOGASTRODUODENOSCOPY (EGD) WITH PROPOFOL;  Surgeon: Sherrilyn Rist, MD;  Location: [facility];  Service: Gastroenterology;  Laterality: N/A;   ESOPHAGOGASTRODUODENOSCOPY (EGD) WITH PROPOFOL N/A 11/19/2021   Procedure: ESOPHAGOGASTRODUODENOSCOPY (EGD) WITH PROPOFOL;  Surgeon: Sherrilyn Rist, MD;  Location: [facility];  Service: Gastroenterology;  Laterality: N/A;  Varices screeing, cirrhosis   HYSTEROSCOPY WITH D & C  08/19/2007   PMB   KNEE ARTHROSCOPY W/ DEBRIDEMENT Left 03/29/2006   INTERNAL DERANGEMENT/ SEVERE DJD/ MENISCUS TEARS   LAPAROSCOPIC CHOLECYSTECTOMY  06/10/2002   LAPAROSCOPIC GASTRIC BANDING  03/01/2006   TRUNCAL VAGOTOMY/ PLACEMENT OF VG BAND   REVISION TOTAL KNEE ARTHROPLASTY Left 08-29-2008; 05/2009   TONSILLECTOMY  1969   TOTAL KNEE ARTHROPLASTY Left 01/23/2007   SEVERE DJD   TOTAL THYROIDECTOMY  11/22/2005   BILATERAL THYROID NODULES-- PAPILLARY CARCINOMA (0.5CM)/ ADENOMATOID NODULES   TRANSTHORACIC ECHOCARDIOGRAM  12/27/2010   LVSF NORMAL / EF 55-65%/ GRADE I DIASTOLIC DYSFUNCTION/ MILD MITRAL REGURG. / MILDLY DILATED LEFT ATRIUM/ MILDY INCREASED SYSTOLIC PRESSURE OF PULMONARY ARTERIES   TRANSVAGINAL SUBURETERAL TAPE/ SLING  09/28/2010   MIXED URINARY INCONTINENCE   TUBAL LIGATION  1983   Social History   Socioeconomic History   Marital status: Married    Spouse name: Not on file   Number of children: 2   Years of education: 12   Highest education level: Not on file  Occupational History   Occupation: disabled    Employer: UNEMPLOYED  Tobacco Use   Smoking status: Former    Current packs/day: 0.00    Average packs/day: 2.5 packs/day for 39.0 years (97.5 ttl pk-yrs)    Types: Cigarettes    Start date: 10/23/1971  Quit date: 10/22/2010    Years since quitting: 12.5   Smokeless tobacco: Never  Vaping  Use   Vaping status: Never Used  Substance and Sexual Activity   Alcohol use: No    Alcohol/week: 0.0 standard drinks of alcohol   Drug use: No   Sexual activity: Not Currently  Other Topics Concern   Not on file  Social History Narrative   Lives at home w/ her husband    Right-handed   Caffeine: 1 cup of coffee per week + [employer]   Social Determinants of Health   Financial Resource Strain: Medium Risk (06/06/2022)   Overall Financial Resource Strain (CARDIA)    Difficulty of Paying Living Expenses: Somewhat hard  Food Insecurity: No Food Insecurity (06/06/2022)   Hunger Vital Sign    Worried About Running Out of Food in the Last Year: Never true    Ran Out of Food in the Last Year: Never true  Transportation Needs: No Transportation Needs (06/06/2022)   PRAPARE - Administrator, Civil Service (Medical): No    Lack of Transportation (Non-Medical): No  Physical Activity: Inactive (06/06/2022)   Exercise Vital Sign    Days of Exercise per Week: 0 days    Minutes of Exercise per Session: 0 min  Stress: No Stress Concern Present (06/06/2022)   [employer] of Occupational Health - Occupational Stress Questionnaire    Feeling of Stress : Only a little  Social Connections: Socially Integrated (06/06/2022)   Social Connection and Isolation Panel [NHANES]    Frequency of Communication with Friends and Family: More than three times a week    Frequency of Social Gatherings with Friends and Family: More than three times a week    Attends Religious Services: More than 4 times per year    Active Member of [employer] or Organizations: Yes    Attends Engineer, structural: More than 4 times per year    Marital Status: Married  Catering manager Violence: Not At Risk (06/06/2022)   Humiliation, Afraid, Rape, and Kick questionnaire    Fear of Current or Ex-Partner: No    Emotionally Abused: No    Physically Abused: No    Sexually Abused: No   Current Outpatient  Medications on File Prior to Visit  Medication Sig Dispense Refill   Adalimumab (HUMIRA PEN) 40 MG/0.4ML PNKT Inject 40 mg into the skin every 14 (fourteen) days. Every other Thursday (Patient not taking: Reported on 02/28/2023)     albuterol (VENTOLIN HFA) 108 (90 Base) MCG/ACT inhaler TAKE 2 PUFFS BY MOUTH EVERY 6 HOURS AS NEEDED FOR WHEEZE OR SHORTNESS OF BREATH 18 g 11   Albuterol Sulfate (PROAIR RESPICLICK) 108 (90 Base) MCG/ACT AEPB 1 puff as needed Inhalation every 4 hrs     amLODipine (NORVASC) 5 MG tablet TAKE ONE TABLET BY MOUTH AT BREAKFAST AND AT BEDTIME 60 tablet 5   buprenorphine (BUTRANS) 20 MCG/HR PTWK 1 patch once a week. (Patient not taking: Reported on 02/28/2023)     Buprenorphine HCl 150 MCG FILM Place inside cheek.     busPIRone (BUSPAR) 15 MG tablet Take 15 mg by mouth daily as needed (for anxiety). (Patient not taking: Reported on 02/28/2023)     celecoxib (CELEBREX) 100 MG capsule Take 100 mg by mouth 2 (two) times daily.     cloNIDine (CATAPRES) 0.1 MG tablet Take 0.1 mg by mouth every 8 (eight) hours as needed.     colchicine 0.6 MG tablet Take one tab bid  x 2 days and then daily 10 tablet 0   Continuous Blood Gluc Sensor (FREESTYLE LIBRE 3 SENSOR) MISC 1 each by Does not apply route every 14 (fourteen) days. 6 each 3   ergocalciferol (VITAMIN D2) 1.25 MG (50000 UT) capsule Take 50,000 Units by mouth once a week. Monday     fexofenadine (ALLEGRA) 180 MG tablet Take 180 mg by mouth daily as needed for allergies or rhinitis.     folic acid (FOLVITE) 1 MG tablet Take 1 mg by mouth daily.     Furosemide (FUROSCIX) 80 MG/10ML CTKT Inject 1 Dose into the skin once for 1 dose. 1 each 0   furosemide (LASIX) 40 MG tablet Take 1 tablet (40 mg total) by mouth every morning. 90 tablet 3   gabapentin (NEURONTIN) 300 MG capsule Take 300 mg by mouth 3 (three) times daily as needed (pain).     hydrALAZINE (APRESOLINE) 25 MG tablet TAKE ONE TABLET BY MOUTH AT BREAKFAST AND AT BEDTIME May take  extra tab if needed if bp over 170/80 90 tablet 2   hydrOXYzine (ATARAX) 50 MG tablet Take 50 mg by mouth at bedtime as needed. (Patient not taking: Reported on 02/28/2023)     hydrOXYzine (VISTARIL) 50 MG capsule Take 50 mg by mouth 2 (two) times daily as needed for itching or anxiety.     Insulin Pen Needle 32G X 4 MM MISC Use 3x a day 300 each 3   insulin regular human CONCENTRATED (HUMULIN R U-500 KWIKPEN) 500 UNIT/ML KwikPen INJECT UNDER SKIN 30 MIN BEFORE MEALS - TOTAL OF 200 UNITS A DAY AS ADVISED 18 mL 1   Insulin Syringe-Needle U-100 26G X 1/2" 1 ML MISC Use daily for insulin injection as directed 100 each 3   Ipratropium-Albuterol (COMBIVENT RESPIMAT) 20-100 MCG/ACT AERS respimat Inhale 1 puff into the lungs every 6 (six) hours as needed for wheezing. 4 g 5   ipratropium-albuterol (DUONEB) 0.5-2.5 (3) MG/3ML SOLN Take 3 mLs by nebulization every 6 (six) hours as needed (Wheezing or dyspnea.). 360 mL 11   levothyroxine (SYNTHROID) 175 MCG tablet TAKE ONE TABLET BY MOUTH BEFORE BREAKFAST 90 tablet 3   metFORMIN (GLUCOPHAGE) 500 MG tablet TAKE ONE TABLET BY MOUTH EVERY MORNING and TAKE ONE TABLET BY MOUTH EVERYDAY AT BEDTIME 180 tablet 3   metoprolol succinate (TOPROL-XL) 25 MG 24 hr tablet Take 1 tablet (25 mg total) by mouth at bedtime. Take with or immediately following a meal. 90 tablet 3   OXYGEN Inhale 3 L into the lungs at bedtime.     PARoxetine (PAXIL) 40 MG tablet Take 1 tablet (40 mg total) by mouth daily. 90 tablet 1   potassium chloride SA (KLOR-CON M20) 20 MEQ tablet Take 2 tab daily 180 tablet 3   promethazine (PHENERGAN) 25 MG tablet TAKE 1/2 TABLET BY MOUTH EVERY 8 HOURS AS NEEDED FOR NAUSEA AND VOMITING 30 tablet 0   Propylene Glycol (SYSTANE BALANCE) 0.6 % SOLN Place 1 drop into both eyes daily as needed (dry eyes).     Semaglutide, 1 MG/DOSE, (OZEMPIC, 1 MG/DOSE,) 4 MG/3ML SOPN Inject 1 mg into the skin once a week. 9 mL 3   simvastatin (ZOCOR) 20 MG tablet Take 1 tablet (20  mg total) by mouth daily at 6 PM. 90 tablet 3   tiZANidine (ZANAFLEX) 4 MG tablet Take 4 mg by mouth every 8 (eight) hours as needed for muscle spasms.     traZODone (DESYREL) 150 MG tablet Take 150 mg  by mouth at bedtime as needed for sleep. (Patient not taking: Reported on 02/28/2023)     No current facility-administered medications on file prior to visit.   Allergies  Allergen Reactions   Gabapentin Swelling    Swelling in legs    Losartan Other (See Comments)    Myalgias and muscle cramping    Aricept [Donepezil Hcl]     Nausea/vomiting, low BP   Aripiprazole Other (See Comments)    Patient reports Hypoglycemia   Hydroxyzine     hallucinations   Oxycodone-Acetaminophen Itching   Oxycodone Itching   Sulfa Antibiotics Nausea Only and Rash   Sulfonamide Derivatives Nausea Only and Rash   Family History  Problem Relation Age of Onset   Colon polyps Mother    Diabetes Mother    Heart disease Mother    Dementia Mother    Heart disease Father    High blood pressure Father    Colon cancer Maternal Uncle        x 3   Stomach cancer Paternal Uncle    Breast cancer Other        great aunts x 5   Esophageal cancer Neg Hx    Rectal cancer Neg Hx    PE: There were no vitals taken for this visit. Wt Readings from Last 3 Encounters:  02/28/23 (!) 317 lb (143.8 kg)  02/27/23 (!) 320 lb (145.2 kg)  02/26/23 (!) 322 lb 9.6 oz (146.3 kg)   Constitutional: overweight, in NAD Eyes:  EOMI, no exophthalmos ENT: no neck masses, no cervical lymphadenopathy Cardiovascular: RRR, No MRG Respiratory: CTA B Musculoskeletal: no deformities Skin:no rashes Neurological: no tremor with outstretched hands  ASSESSMENT: 1. DM2, insulin-dependent, uncontrolled, with complications - cerebrovascular disease with history of TIA 2012 - peripheral neuropathy - gastroparesis - DKA 2015  2.  History of thyroid cancer  3.  Postsurgical hypothyroidism  PLAN:  1. Patient with longstanding,  uncontrolled, type 2 diabetes, on metformin, weekly GLP-1 receptor agonist and concentrated insulin, with still poor control.  At last visit, HbA1c was higher, at 12.1% and she had another HbA1c obtained soon after and this was even higher, at 12.3%. -At last visit, reviewing the CGM trends, sugars are very high, almost entirely above target but she had occasional drops in blood sugars especially in the evening, before midnight.  Upon questioning, she was using lower doses of U-500 insulin than recommended, especially when she was off the sensor.  She was tells me that she was not able to get the sensor from [place].  I advised her to call her insurance and find out which supplier we can work with.  In the meantime, reviewed the blood sugars, we discussed about taking the insulin before each meal and also before her cappuccino in the morning.  She was waking up around 7-8 in the morning, drinking cappuccino and only eating brunch around 11 AM.  I advised her to take 30 units of U-500 insulin before her cappuccino and then to take U-500 before brunch and increase the dose before dinner.  We continued the metformin and Ozempic doses. CGM interpretation: -At today's visit, we reviewed her CGM downloads: It appears that *** of values are in target range (goal >70%), while *** are higher than 180 (goal <25%), and *** are lower than 70 (goal <4%).  The calculated average blood sugar is ***.  The projected HbA1c for the next 3 months (GMI) is ***. -Reviewing the CGM trends, ***  - I suggested  to:  Patient Instructions  Please continue: - Metformin 500 mg 2x a day - Ozempic 1 mg weekly in a.m.  - U500 - 30 min before the meal: - 30 units before cappuccino - 40-60 units before brunch - 60-80 units before dinner     Check with your insurance which supplier can be used for your sensor.  Please continue Levothyroxine 150 mcg daily.   Take the thyroid hormone every day, with water, at least 30 minutes before  breakfast, separated by at least 4 hours from: - acid reflux medications - calcium - iron - multivitamins   Please return in 3 months.       - we checked her HbA1c: 7%  - advised to check sugars at different times of the day - 4x a day, rotating check times - advised for yearly eye exams >> she is UTD - return to clinic in 3 months  2.  History of thyroid cancer -She had a papillary thyroid cancer focus in the right thyroid lobe, measuring 0.5 cm.  No vascular, lymphatic or direct extension into the surrounding tissues -No neck compression symptoms -A neck ultrasound in 10/2020 showed no concerning masses -She is most likely cured  3.  Postsurgical hypothyroidism - latest thyroid labs reviewed with pt. >> normal: Lab Results  Component Value Date   TSH 10.66 (H) 12/24/2022  - she continues on LT4 175 mcg daily, dose increased after the above results returned - pt feels good on this dose. - we discussed about taking the thyroid hormone every day, with water, >30 minutes before breakfast, separated by >4 hours from acid reflux medications, calcium, iron, multivitamins. Pt. is taking it correctly. - will check thyroid tests today  Carlus Pavlov, MD PhD [facility]

## 2023-04-25 ENCOUNTER — Other Ambulatory Visit: Payer: Self-pay

## 2023-04-25 DIAGNOSIS — F1911 Other psychoactive substance abuse, in remission: Secondary | ICD-10-CM | POA: Diagnosis not present

## 2023-04-25 DIAGNOSIS — F4323 Adjustment disorder with mixed anxiety and depressed mood: Secondary | ICD-10-CM | POA: Diagnosis not present

## 2023-04-25 DIAGNOSIS — F418 Other specified anxiety disorders: Secondary | ICD-10-CM | POA: Diagnosis not present

## 2023-04-25 DIAGNOSIS — F332 Major depressive disorder, recurrent severe without psychotic features: Secondary | ICD-10-CM | POA: Diagnosis not present

## 2023-04-25 MED ORDER — HYDRALAZINE HCL 25 MG PO TABS: ORAL_TABLET | ORAL | 3 refills | Status: DC

## 2023-05-06 ENCOUNTER — Institutional Professional Consult (permissible substitution): Payer: Medicare HMO | Admitting: Internal Medicine

## 2023-05-08 DIAGNOSIS — H43813 Vitreous degeneration, bilateral: Secondary | ICD-10-CM | POA: Diagnosis not present

## 2023-05-08 DIAGNOSIS — H2513 Age-related nuclear cataract, bilateral: Secondary | ICD-10-CM | POA: Diagnosis not present

## 2023-05-08 DIAGNOSIS — H35033 Hypertensive retinopathy, bilateral: Secondary | ICD-10-CM | POA: Diagnosis not present

## 2023-05-08 DIAGNOSIS — H353231 Exudative age-related macular degeneration, bilateral, with active choroidal neovascularization: Secondary | ICD-10-CM | POA: Diagnosis not present

## 2023-05-12 ENCOUNTER — Other Ambulatory Visit: Payer: Self-pay | Admitting: Internal Medicine

## 2023-05-12 ENCOUNTER — Other Ambulatory Visit: Payer: Self-pay | Admitting: Gastroenterology

## 2023-05-13 DIAGNOSIS — J449 Chronic obstructive pulmonary disease, unspecified: Secondary | ICD-10-CM | POA: Diagnosis not present

## 2023-05-15 DIAGNOSIS — M533 Sacrococcygeal disorders, not elsewhere classified: Secondary | ICD-10-CM | POA: Diagnosis not present

## 2023-05-15 DIAGNOSIS — M5137 Other intervertebral disc degeneration, lumbosacral region: Secondary | ICD-10-CM | POA: Diagnosis not present

## 2023-05-15 DIAGNOSIS — E1142 Type 2 diabetes mellitus with diabetic polyneuropathy: Secondary | ICD-10-CM | POA: Diagnosis not present

## 2023-05-15 DIAGNOSIS — G894 Chronic pain syndrome: Secondary | ICD-10-CM | POA: Diagnosis not present

## 2023-05-19 ENCOUNTER — Telehealth: Payer: Self-pay | Admitting: Internal Medicine

## 2023-05-19 NOTE — Telephone Encounter (Signed)
Spoke with patient and appointment made for this Friday.

## 2023-05-19 NOTE — Telephone Encounter (Signed)
Pt is requesting her Dr or nurse to order a walker you can sit in (XL). Or where to find one.  Please advise at your earliest convenience.

## 2023-05-21 DIAGNOSIS — G894 Chronic pain syndrome: Secondary | ICD-10-CM | POA: Diagnosis not present

## 2023-05-21 DIAGNOSIS — M5137 Other intervertebral disc degeneration, lumbosacral region: Secondary | ICD-10-CM | POA: Diagnosis not present

## 2023-05-21 DIAGNOSIS — M533 Sacrococcygeal disorders, not elsewhere classified: Secondary | ICD-10-CM | POA: Diagnosis not present

## 2023-05-21 DIAGNOSIS — E1142 Type 2 diabetes mellitus with diabetic polyneuropathy: Secondary | ICD-10-CM | POA: Diagnosis not present

## 2023-05-22 ENCOUNTER — Ambulatory Visit: Payer: Medicare HMO | Admitting: Internal Medicine

## 2023-05-22 NOTE — Progress Notes (Signed)
Virtual Visit via Video Note  I connected with Mary Acosta on 05/22/23 at  2:40 PM EDT by a video enabled telemedicine application and verified that I am speaking with the correct person using two identifiers.   I discussed the limitations of evaluation and management by telemedicine and the availability of in person appointments. The patient expressed understanding and agreed to proceed.  Present for the visit:  Myself, Dr Cheryll Cockayne, [place].  The patient is currently at home and I am in the office.    No referring provider.    History of Present Illness: This is an acute visit for fall three days ago.  She fell on her knees and the side of the chair went into her breast.  She is very sore and unsteady-more so than previous to the fall.  She does use a walker to ambulate-cannot ambulate long distances.  She is not able to stand for more than 1 minute without back pain.  She is taking her medications.  Sugar has been very high-last week it was almost 600.  She is having difficulty getting it controlled.  She does want to check her blood work for everything if possible.  Left-sided upper abdominal pain.  She does have intermittent upper abdominal pain.  She gets concerned about her pancreas.  She wonders why her sugars are still high and if it is related to that pain.  She does feel anxious a lot.  She wanted to request a rollator.  She cannot walk far and needs to sit down when walking.  She has chronic knee pain, back pain and neuropathy which are all contributing.      Review of Systems  Constitutional:  Negative for fever.  Respiratory:  Positive for wheezing (occ). Negative for cough and shortness of breath.   Cardiovascular:  Positive for leg swelling. Negative for chest pain and palpitations.  Gastrointestinal:  Positive for abdominal pain (sometimes).  Musculoskeletal:  Positive for back pain and joint pain.  Neurological:  Positive for dizziness  (occ). Negative for headaches.      Social History   Socioeconomic History   Marital status: Married    Spouse name: Not on file   Number of children: 2   Years of education: 12   Highest education level: Not on file  Occupational History   Occupation: disabled    Employer: UNEMPLOYED  Tobacco Use   Smoking status: Former    Current packs/day: 0.00    Average packs/day: 2.5 packs/day for 39.0 years (97.5 ttl pk-yrs)    Types: Cigarettes    Start date: 10/23/1971    Quit date: 10/22/2010    Years since quitting: 12.5   Smokeless tobacco: Never  Vaping Use   Vaping status: Never Used  Substance and Sexual Activity   Alcohol use: No    Alcohol/week: 0.0 standard drinks of alcohol   Drug use: No   Sexual activity: Not Currently  Other Topics Concern   Not on file  Social History Narrative   Lives at home w/ her husband    Right-handed   Caffeine: 1 cup of coffee per week + [employer]   Social Determinants of Health   Financial Resource Strain: Medium Risk (06/06/2022)   Overall Financial Resource Strain (CARDIA)    Difficulty of Paying Living Expenses: Somewhat hard  Food Insecurity: No Food Insecurity (06/06/2022)   Hunger Vital Sign    Worried About Running Out of Food in the Last Year: Never  true    Ran Out of Food in the Last Year: Never true  Transportation Needs: No Transportation Needs (06/06/2022)   PRAPARE - Administrator, Civil Service (Medical): No    Lack of Transportation (Non-Medical): No  Physical Activity: Inactive (06/06/2022)   Exercise Vital Sign    Days of Exercise per Week: 0 days    Minutes of Exercise per Session: 0 min  Stress: No Stress Concern Present (06/06/2022)   [employer] of Occupational Health - Occupational Stress Questionnaire    Feeling of Stress : Only a little  Social Connections: Socially Integrated (06/06/2022)   Social Connection and Isolation Panel [NHANES]    Frequency of Communication with Friends and  Family: More than three times a week    Frequency of Social Gatherings with Friends and Family: More than three times a week    Attends Religious Services: More than 4 times per year    Active Member of [employer] or Organizations: Yes    Attends Engineer, structural: More than 4 times per year    Marital Status: Married     Observations/Objective: Appears well in NAD Breathing normally  Assessment and Plan:  See Problem List for Assessment and Plan of chronic medical problems.   Follow Up Instructions:    I discussed the assessment and treatment plan with the patient. The patient was provided an opportunity to ask questions and all were answered. The patient agreed with the plan and demonstrated an understanding of the instructions.   The patient was advised to call back or seek an in-person evaluation if the symptoms worsen or if the condition fails to improve as anticipated.    Pincus Sanes, MD

## 2023-05-23 ENCOUNTER — Telehealth (INDEPENDENT_AMBULATORY_CARE_PROVIDER_SITE_OTHER): Payer: Medicare HMO | Admitting: Internal Medicine

## 2023-05-23 ENCOUNTER — Encounter: Payer: Self-pay | Admitting: Internal Medicine

## 2023-05-23 DIAGNOSIS — F3289 Other specified depressive episodes: Secondary | ICD-10-CM

## 2023-05-23 DIAGNOSIS — E890 Postprocedural hypothyroidism: Secondary | ICD-10-CM | POA: Diagnosis not present

## 2023-05-23 DIAGNOSIS — I10 Essential (primary) hypertension: Secondary | ICD-10-CM | POA: Diagnosis not present

## 2023-05-23 DIAGNOSIS — E1165 Type 2 diabetes mellitus with hyperglycemia: Secondary | ICD-10-CM

## 2023-05-23 DIAGNOSIS — E782 Mixed hyperlipidemia: Secondary | ICD-10-CM | POA: Diagnosis not present

## 2023-05-23 DIAGNOSIS — M545 Low back pain, unspecified: Secondary | ICD-10-CM | POA: Diagnosis not present

## 2023-05-23 DIAGNOSIS — G8929 Other chronic pain: Secondary | ICD-10-CM | POA: Diagnosis not present

## 2023-05-23 DIAGNOSIS — R748 Abnormal levels of other serum enzymes: Secondary | ICD-10-CM

## 2023-05-23 DIAGNOSIS — E1159 Type 2 diabetes mellitus with other circulatory complications: Secondary | ICD-10-CM

## 2023-05-23 DIAGNOSIS — R1012 Left upper quadrant pain: Secondary | ICD-10-CM

## 2023-05-23 DIAGNOSIS — E1142 Type 2 diabetes mellitus with diabetic polyneuropathy: Secondary | ICD-10-CM

## 2023-05-23 DIAGNOSIS — M170 Bilateral primary osteoarthritis of knee: Secondary | ICD-10-CM | POA: Diagnosis not present

## 2023-05-23 NOTE — Assessment & Plan Note (Addendum)
Chronic Blood pressure controlled at home when she checks it Advised checking BP regularly Continue amlodipine 5 mg daily, hydralazine 25 mg twice daily, metoprolol 25 mg twice daily Check CMP, CBC

## 2023-05-23 NOTE — Assessment & Plan Note (Signed)
Chronic Check lipid panel, CMP Continue simvastatin 20 mg daily

## 2023-05-23 NOTE — Assessment & Plan Note (Signed)
Chronic Ecreased sensation in both feet She is checking her feet daily - no current lesions Neuropathy is likely contributing to some of her falls and poor balance She would benefit from a rollator which I will order-needs to have a seat so that she can sit down when ambulating longer distances

## 2023-05-23 NOTE — Assessment & Plan Note (Signed)
Chronic Following with pain management Likely contributing to some of her falls Has difficulty ambulating because of the pain and it cannot ambulate long distances Would benefit from a rollator-will order

## 2023-05-23 NOTE — Assessment & Plan Note (Signed)
Intermittent Left upper abdominal pain She is concerned about her pancreas Difficult to know exactly what is causing the pain since this is a virtual visit She will be getting blood work-will check amylase and lipase

## 2023-05-23 NOTE — Assessment & Plan Note (Signed)
Chronic Affects her ability to walk long distances Sometimes her legs do give out on her Would benefit from a rollator so that she can sit when needed and ambulate is much as possible

## 2023-05-23 NOTE — Assessment & Plan Note (Signed)
Chronic Management per psychiatry Overall controlled 

## 2023-05-23 NOTE — Assessment & Plan Note (Signed)
Chronic Management per endocrine She will be getting blood work done on Monday so I will add a TSH for convenience

## 2023-05-26 ENCOUNTER — Telehealth: Payer: Self-pay

## 2023-05-26 NOTE — Telephone Encounter (Signed)
Cologuard recall letter mailed to patient.

## 2023-05-27 LAB — LIPID PANEL
Cholesterol: 163 mg/dL (ref 0–200)
HDL: 32.3 mg/dL — ABNORMAL LOW (ref >39.00)
LDL Cholesterol: 88 mg/dL (ref 0–99)
NonHDL: 130.87
Total CHOL/HDL Ratio: 5
Triglycerides: 215 mg/dL — ABNORMAL HIGH (ref 0.0–149.0)
VLDL: 43 mg/dL — ABNORMAL HIGH (ref 0.0–40.0)

## 2023-05-27 LAB — COMPREHENSIVE METABOLIC PANEL
ALT: 13 U/L (ref 0–35)
AST: 16 U/L (ref 0–37)
Albumin: 3.8 g/dL (ref 3.5–5.2)
Alkaline Phosphatase: 98 U/L (ref 39–117)
BUN: 18 mg/dL (ref 6–23)
CO2: 26 meq/L (ref 19–32)
Calcium: 9 mg/dL (ref 8.4–10.5)
Chloride: 90 meq/L — ABNORMAL LOW (ref 96–112)
Creatinine, Ser: 1.37 mg/dL — ABNORMAL HIGH (ref 0.40–1.20)
GFR: 40.11 mL/min — ABNORMAL LOW (ref >60.00)
Glucose, Bld: 648 mg/dL (ref 70–99)
Potassium: 5.1 meq/L (ref 3.5–5.1)
Sodium: 127 meq/L — ABNORMAL LOW (ref 135–145)
Total Bilirubin: 0.6 mg/dL (ref 0.2–1.2)
Total Protein: 7 g/dL (ref 6.0–8.3)

## 2023-05-27 LAB — MICROALBUMIN / CREATININE URINE RATIO
Creatinine,U: 46.5 mg/dL
Microalb Creat Ratio: 1.5 mg/g (ref 0.0–30.0)
Microalb, Ur: <0.7 mg/dL (ref 0.0–1.9)

## 2023-05-27 LAB — CBC WITH DIFFERENTIAL/PLATELET
Basophils Absolute: 0.1 10*3/uL (ref 0.0–0.1)
Basophils Relative: 0.8 % (ref 0.0–3.0)
Eosinophils Absolute: 0 10*3/uL (ref 0.0–0.7)
Eosinophils Relative: 0.5 % (ref 0.0–5.0)
HCT: 40.5 % (ref 36.0–46.0)
Hemoglobin: 12.1 g/dL (ref 12.0–15.0)
Lymphocytes Relative: 13.5 % (ref 12.0–46.0)
Lymphs Abs: 1 10*3/uL (ref 0.7–4.0)
MCHC: 30 g/dL (ref 30.0–36.0)
MCV: 73.7 fL — ABNORMAL LOW (ref 78.0–100.0)
Monocytes Absolute: 0.3 10*3/uL (ref 0.1–1.0)
Monocytes Relative: 4.6 % (ref 3.0–12.0)
Neutro Abs: 5.9 10*3/uL (ref 1.4–7.7)
Neutrophils Relative %: 80.6 % — ABNORMAL HIGH (ref 43.0–77.0)
Platelets: 172 10*3/uL (ref 150.0–400.0)
RBC: 5.5 Mil/uL — ABNORMAL HIGH (ref 3.87–5.11)
RDW: 17.7 % — ABNORMAL HIGH (ref 11.5–15.5)
WBC: 7.4 10*3/uL (ref 4.0–10.5)

## 2023-05-27 LAB — HEMOGLOBIN A1C: Hgb A1c MFr Bld: 13 % — ABNORMAL HIGH (ref 4.6–6.5)

## 2023-05-27 LAB — AMYLASE: Amylase: 15 U/L — ABNORMAL LOW (ref 27–131)

## 2023-05-27 LAB — LIPASE: Lipase: 131 U/L — ABNORMAL HIGH (ref 11.0–59.0)

## 2023-05-27 LAB — TSH: TSH: 3.01 u[IU]/mL (ref 0.35–5.50)

## 2023-05-28 ENCOUNTER — Other Ambulatory Visit: Payer: Self-pay | Admitting: Internal Medicine

## 2023-05-28 NOTE — Telephone Encounter (Signed)
Metformin refill request complete

## 2023-05-29 ENCOUNTER — Encounter: Payer: Self-pay | Admitting: Internal Medicine

## 2023-05-29 NOTE — Addendum Note (Signed)
Addended by: Pincus Sanes on: 05/29/2023 04:26 PM   Modules accepted: Orders

## 2023-06-01 ENCOUNTER — Other Ambulatory Visit: Payer: Self-pay | Admitting: Gastroenterology

## 2023-06-01 DIAGNOSIS — R110 Nausea: Secondary | ICD-10-CM

## 2023-06-02 NOTE — Telephone Encounter (Signed)
This medicine can be refilled at the current dose for a 1 month supply with 2 refills.  However, she also has underlying cirrhosis and is overdue for clinic follow-up with me.  Please help arrange next available follow-up clinic visit.  H Danis

## 2023-06-03 NOTE — Telephone Encounter (Signed)
Spoke to pt husband Faylene Million.  Faylene Million stated that pt was currently out of town and would return Thursday.  Pt to call back.

## 2023-06-06 ENCOUNTER — Other Ambulatory Visit: Payer: Medicare HMO

## 2023-06-09 ENCOUNTER — Encounter: Payer: Self-pay | Admitting: Internal Medicine

## 2023-06-09 ENCOUNTER — Ambulatory Visit: Payer: Medicare HMO | Admitting: Internal Medicine

## 2023-06-09 VITALS — BP 120/80 | HR 101 | Ht 69.0 in | Wt 292.6 lb

## 2023-06-09 DIAGNOSIS — E1159 Type 2 diabetes mellitus with other circulatory complications: Secondary | ICD-10-CM | POA: Diagnosis not present

## 2023-06-09 DIAGNOSIS — E782 Mixed hyperlipidemia: Secondary | ICD-10-CM

## 2023-06-09 DIAGNOSIS — Z85850 Personal history of malignant neoplasm of thyroid: Secondary | ICD-10-CM | POA: Diagnosis not present

## 2023-06-09 DIAGNOSIS — E1165 Type 2 diabetes mellitus with hyperglycemia: Secondary | ICD-10-CM

## 2023-06-09 DIAGNOSIS — Z794 Long term (current) use of insulin: Secondary | ICD-10-CM | POA: Diagnosis not present

## 2023-06-09 DIAGNOSIS — Z7984 Long term (current) use of oral hypoglycemic drugs: Secondary | ICD-10-CM

## 2023-06-09 DIAGNOSIS — E890 Postprocedural hypothyroidism: Secondary | ICD-10-CM | POA: Diagnosis not present

## 2023-06-09 NOTE — Patient Instructions (Addendum)
Please continue: - Metformin 500 mg 2x a day - U500 - 30 min before the meal: - 80-100 units before cappuccino - 50-60 units before brunch - 60-80 units before dinner  You may need to take 10-20 units of Lyumjev insulin only for correction.  Try to fast (only with water) 1 day a week. In this day, do not take insulin or Metformin.  Please continue Levothyroxine 175 mcg daily.   Take the thyroid hormone every day, with water, at least 30 minutes before breakfast, separated by at least 4 hours from: - acid reflux medications - calcium - iron - multivitamins   Please return in 1.5 months.

## 2023-06-09 NOTE — Progress Notes (Signed)
Patient ID: Mary Acosta, female   DOB: 1956/08/08, 67 y.o.   MRN: [id number]   HPI: Mary Acosta is a 67 y.o.-year-old female, initially referred by her PCP, Dr. Lawerance Bach, returning for follow-up for DM2, dx in 2000, insulin-dependent since 2011, uncontrolled, with long-term complications (peripheral neuropathy, gastroparesis, cerebrovascular disease with history of TIA 2012, DKA 2015) and also history of thyroid cancer and postsurgical hypothyroidism.  She previously saw my colleague, Dr. Everardo All, up to 07/2019.  Last visit with me 5.5 months ago.  She is here with her son-in-law.  Interim hx: + increased urination, nausea.  No blurry vision, chest pain.  She lost approximately 10 pounds net since last visit. She recently had very high blood sugars in the 500s and 600s and a high lipase that she was taken off Ozempic.  Moreover, she is on prednisone for joint pains due to AKI on Celebrex.    DM2: Reviewed HbA1c levels: Lab Results  Component Value Date   HGBA1C 13.0 (H) 05/27/2023   HGBA1C 12.1 (A) 12/24/2022   HGBA1C 10.6 (A) 08/08/2022   HGBA1C 11.9 (A) 05/10/2022   HGBA1C 8.1 (A) 10/09/2021   HGBA1C 8.6 (A) 06/04/2021   HGBA1C 6.9 (A) 01/15/2021   HGBA1C 7.6 (A) 10/13/2020   HGBA1C 8.1 (A) 07/05/2020   HGBA1C 10.4 (H) 05/03/2020   She was previously on: - Basaglar 80 units at bedtime -[place] >> stopped >> once a week >> 50 >> 80 >> 160 units daily >> 80 units twice a day  - Metformin 1000 mg with dinner >> 500 mg 2x a day - Ozempic 0.5 >> 1 mg weekly - - Lyumjev 10-20 >> 20-25 units before meals >> muscle cramps >> decreased: 14 units Prev. On Rybelsus 3 mg before breakfast-see patient assistance pgm  She was on N and R insulin in the past. She was on metformin in the past >> did not work. She was prev. On Toujeo 180 units daily.  Currently on: - Metformin 500 mg 2x a day - Ozempic 1 mg weekly in a.m. >> stopped 2 weeks ago 2/2 high lipase - U500 - 30 min  before the meal: - 30 >> she is actually taking 120-170 units before cappuccino! - 40-60 >> 40-50 units before brunch - 60-80 >> 0 units before dinner  Pt checks her sugars 4x  times a day, prev. w daily/ CGM (with receiver) - not affordable: - am: 400-550 - 2h after b'fast: n/c - lunch: 250-400 - 2h after lunch: n/c - dinner: 350 - 2h after dinner: 450-500 - bedtime: n/c  Previously:   Lowest sugar was 226 >> 250; she has hypoglycemia awareness in the 70s. Highest sugar was 500s >> 561 >> Hi.  Glucometer: AccuChek, Prodigy  Pt's meals are: - Breakfast: bacon or sausage and eggs, cereal >> grits, or grilled cheese - Lunch: salad; hot dog, sandwiches >> ham and cheese sandwich or skips - Dinner: meat + veggies  >> larger - Snacks: not so much anymore Previously drinking Kool-Aid, now stopped.  However, she is still having that she is drinking High C juice.   She is status post gastric banding with vagotomy in 2008.  She lost 148 pounds afterwards but then gained more than 100 pounds back.  No h/o pancreatitis or FH of MTC.  However, she had a high lipase in 05/2023.  Now off Ozempic.  No CKD, last BUN/creatinine:  Lab Results  Component Value Date   BUN 18 05/27/2023  BUN 21 02/27/2023   CREATININE 1.37 (H) 05/27/2023   CREATININE 1.41 (H) 02/27/2023   Lab Results  Component Value Date   MICRALBCREAT 1.5 05/27/2023   MICRALBCREAT 0.7 09/19/2017   MICRALBCREAT 0.4 02/05/2017   MICRALBCREAT 0.6 02/01/2016   MICRALBCREAT 1.2 12/07/2015  Not on ACE inhibitor/ARB.  + HL; last set of lipids: Lab Results  Component Value Date   CHOL 163 05/27/2023   HDL 32.30 (L) 05/27/2023   LDLCALC 88 05/27/2023   TRIG 215.0 (H) 05/27/2023   CHOLHDL 5 05/27/2023  On Zocor 20, Zetia 10.  - last eye exam was in 2023 reportedly: no DR. Stopped IO injections for macular degeneration.  -She has pain, numbness, tingling in her feet.  Last foot exam 12/24/2022.  On ASA 81.  Pt has  FH of DM in mother.  Thyroid cancer:  Reviewed her thyroid cancer history: She is status post total thyroidectomy - [place] Sx 2007. 11/22/2005: THYROID, THYROIDECTOMY:   - PAPILLARY THYROID CARCINOMA (0.5 CM).   - ADENOMATOID NODULES.    COMMENT   ONCOLOGY TABLE-THYROID    1. Maximum tumor size (cm): 0.5 cm   2. Tumor location: Right mid lobe   3. Multifocality: Not identified   4. Histology: Papillary thyroid carcinoma   5. Margins: Negative   6. Capsular invasion: Present   7. Extrathyroidal extension: Not identified   8. Vascular/Lymphatic invasion: Not identified   9. Lymph nodes: N/A   10. TNM code: pT1, pNX, pMX   11. Comments: Examination of the thyroidectomy specimen   grossly demonstrates multiple nodules. Histologic evaluation   demonstrates numerous adenomatoid nodules. One nodule measuring   0.5 cm demonstrates features compatible with papillary thyroid   carcinoma. The surgical margins are negative. (MS:jy) 11/25/05   Neck U/S (10/25/2020): no abnormal neck masses  Postsurgical hypothyroidism:  She takes 175 mcg levothyroxine daily (dose increased 11/2022): - no missed doses anymore - in am - fasting - at least 30 min from b'fast - no calcium - no iron - no multivitamins - no PPIs - not on Biotin  Reviewed her TFTs: Lab Results  Component Value Date   TSH 3.01 05/27/2023   TSH 10.66 (H) 12/24/2022   TSH 4.62 05/10/2022   TSH 0.13 (L) 10/09/2021   TSH 0.51 02/02/2021   TSH 0.14 (L) 08/04/2020   TSH 4.42 05/03/2020   TSH 1.50 10/22/2019   TSH 1.09 07/20/2019   TSH 1.32 04/01/2019   TSH 0.55 09/04/2018   TSH 0.90 12/26/2017   TSH 0.113 (L) 12/08/2017   TSH 0.22 (L) 09/19/2017   TSH 0.62 06/13/2017   Pt denies: - feeling nodules in neck - hoarseness - dysphagia - choking  She also has a history of COPD, OSA, fibromyalgia, depression, fatty liver-cirrhosis, DDD, h/o vb fx, PMR. She is seen in pain clinic in [facility].  She is on  Neurontin and amitriptyline. She lost 2 sons: 2020 and 09/2020.    ROS: + see HPI  I reviewed pt's medications, allergies, PMH, social hx, family hx, and changes were documented in the history of present illness. Otherwise, unchanged from my initial visit note.  Past Medical History:  Diagnosis Date   AKI (acute kidney injury) (HCC) 01/2017   Allergy    seasonal   Anxiety    with panic attacks   Arthritis    "back; feet; hands; shoulders" (08/26/2014)   Asthma    Cataract    forming   Cervical cancer (HCC)  Chronic lower back pain    Chronic narcotic use    Chronic pain syndrome    [facility]   Cirrhosis (HCC)    Clostridium difficile infection 2017   COPD (chronic obstructive pulmonary disease) (HCC)    Daily headache    past hx   Depression    Diabetic neuropathy (HCC) 06/04/2017   feet  and legs , some in hands   DJD (degenerative joint disease)    Fatty liver disease, nonalcoholic    Fibromyalgia    Frequency of urination    HCAP (healthcare-associated pneumonia) 08/26/2014   History of TIA (transient ischemic attack) 11/01/2010   NO RESIDUAL   Hyperlipidemia    Hypertension    Hypothyroidism    IDDM (insulin dependent diabetes mellitus)    Insomnia    Lumbar stenosis    Macular degeneration    Memory difficulty 07/25/2016   Nocturia    OSA (obstructive sleep apnea)    NO CPAP SINCE WT LOSS   Osteoarthritis    with severe disease in knee   Oxygen deficiency    4  liters at bedtime only   Pneumonia "several times"   Polymyalgia rheumatica (HCC)    Scoliosis    Seasonal allergies    Stroke [facility])    TIA   Thyroid cancer (HCC)    Urgency of urination    Vaginal pain S/P SLING  FEB 2012   Past Surgical History:  Procedure Laterality Date   APPENDECTOMY  1982   BREAST EXCISIONAL BIOPSY Left 02/28/2005   Atypical Ductal Hyperplasia   CARDIAC CATHETERIZATION  09/04/2004   NORMAL CORONARY ANATOMY/ NORMAL LVF/ EF 60%   CARDIOVASCULAR  STRESS TEST  12-27-2010  DR [place]   ABNORMAL NUCLEAR STUDY W/ /MILD INFERIOR ISCHEMIA/ EF 69%/  CT HEART ANGIOGRAM ;  NO ACUTE FINDINGS   COLONOSCOPY     CRYOABLATION  05/16/2003   w/LEEP FOR ABNORMAL PAP SMEAR   CYSTOSCOPY  05/18/2012   Procedure: CYSTOSCOPY;  Surgeon: Martina Sinner, MD;  Location: [facility];  Service: Urology;  Laterality: N/A;  examination under anethesia   ESOPHAGOGASTRODUODENOSCOPY (EGD) WITH PROPOFOL N/A 09/03/2016   Procedure: ESOPHAGOGASTRODUODENOSCOPY (EGD) WITH PROPOFOL;  Surgeon: Sherrilyn Rist, MD;  Location: [facility];  Service: Gastroenterology;  Laterality: N/A;   ESOPHAGOGASTRODUODENOSCOPY (EGD) WITH PROPOFOL N/A 11/19/2021   Procedure: ESOPHAGOGASTRODUODENOSCOPY (EGD) WITH PROPOFOL;  Surgeon: Sherrilyn Rist, MD;  Location: [facility];  Service: Gastroenterology;  Laterality: N/A;  Varices screeing, cirrhosis   HYSTEROSCOPY WITH D & C  08/19/2007   PMB   KNEE ARTHROSCOPY W/ DEBRIDEMENT Left 03/29/2006   INTERNAL DERANGEMENT/ SEVERE DJD/ MENISCUS TEARS   LAPAROSCOPIC CHOLECYSTECTOMY  06/10/2002   LAPAROSCOPIC GASTRIC BANDING  03/01/2006   TRUNCAL VAGOTOMY/ PLACEMENT OF VG BAND   REVISION TOTAL KNEE ARTHROPLASTY Left 08-29-2008; 05/2009   TONSILLECTOMY  1969   TOTAL KNEE ARTHROPLASTY Left 01/23/2007   SEVERE DJD   TOTAL THYROIDECTOMY  11/22/2005   BILATERAL THYROID NODULES-- PAPILLARY CARCINOMA (0.5CM)/ ADENOMATOID NODULES   TRANSTHORACIC ECHOCARDIOGRAM  12/27/2010   LVSF NORMAL / EF 55-65%/ GRADE I DIASTOLIC DYSFUNCTION/ MILD MITRAL REGURG. / MILDLY DILATED LEFT ATRIUM/ MILDY INCREASED SYSTOLIC PRESSURE OF PULMONARY ARTERIES   TRANSVAGINAL SUBURETERAL TAPE/ SLING  09/28/2010   MIXED URINARY INCONTINENCE   TUBAL LIGATION  1983   Social History   Socioeconomic History   Marital status: Married    Spouse name: Not on file   Number of  children: 2   Years of education: 12   Highest education level: Not on file   Occupational History   Occupation: disabled    Employer: UNEMPLOYED  Tobacco Use   Smoking status: Former    Current packs/day: 0.00    Average packs/day: 2.5 packs/day for 39.0 years (97.5 ttl pk-yrs)    Types: Cigarettes    Start date: 10/23/1971    Quit date: 10/22/2010    Years since quitting: 12.6   Smokeless tobacco: Never  Vaping Use   Vaping status: Never Used  Substance and Sexual Activity   Alcohol use: No    Alcohol/week: 0.0 standard drinks of alcohol   Drug use: No   Sexual activity: Not Currently  Other Topics Concern   Not on file  Social History Narrative   Lives at home w/ her husband    Right-handed   Caffeine: 1 cup of coffee per week + [employer]   Social Determinants of Health   Financial Resource Strain: Medium Risk (06/06/2022)   Overall Financial Resource Strain (CARDIA)    Difficulty of Paying Living Expenses: Somewhat hard  Food Insecurity: No Food Insecurity (06/06/2022)   Hunger Vital Sign    Worried About Running Out of Food in the Last Year: Never true    Ran Out of Food in the Last Year: Never true  Transportation Needs: No Transportation Needs (06/06/2022)   PRAPARE - Administrator, Civil Service (Medical): No    Lack of Transportation (Non-Medical): No  Physical Activity: Inactive (06/06/2022)   Exercise Vital Sign    Days of Exercise per Week: 0 days    Minutes of Exercise per Session: 0 min  Stress: No Stress Concern Present (06/06/2022)   [employer] of Occupational Health - Occupational Stress Questionnaire    Feeling of Stress : Only a little  Social Connections: Socially Integrated (06/06/2022)   Social Connection and Isolation Panel [NHANES]    Frequency of Communication with Friends and Family: More than three times a week    Frequency of Social Gatherings with Friends and Family: More than three times a week    Attends Religious Services: More than 4 times per year    Active Member of [employer] or Organizations: Yes     Attends Engineer, structural: More than 4 times per year    Marital Status: Married  Catering manager Violence: Not At Risk (06/06/2022)   Humiliation, Afraid, Rape, and Kick questionnaire    Fear of Current or Ex-Partner: No    Emotionally Abused: No    Physically Abused: No    Sexually Abused: No   Current Outpatient Medications on File Prior to Visit  Medication Sig Dispense Refill   Adalimumab (HUMIRA PEN) 40 MG/0.4ML PNKT Inject 40 mg into the skin every 14 (fourteen) days. Every other Thursday (Patient not taking: Reported on 02/28/2023)     albuterol (VENTOLIN HFA) 108 (90 Base) MCG/ACT inhaler TAKE 2 PUFFS BY MOUTH EVERY 6 HOURS AS NEEDED FOR WHEEZE OR SHORTNESS OF BREATH 18 g 11   Albuterol Sulfate (PROAIR RESPICLICK) 108 (90 Base) MCG/ACT AEPB 1 puff as needed Inhalation every 4 hrs     amLODipine (NORVASC) 5 MG tablet TAKE 1 TABLET BY MOUTH EVERY MORNING AND EVERY DAY AT BEDTIME 60 tablet 10   Buprenorphine HCl 150 MCG FILM Place inside cheek.     cloNIDine (CATAPRES) 0.1 MG tablet Take 0.1 mg by mouth every 8 (eight) hours as needed.  colchicine 0.6 MG tablet Take one tab bid x 2 days and then daily 10 tablet 0   Continuous Blood Gluc Sensor (FREESTYLE LIBRE 3 SENSOR) MISC 1 each by Does not apply route every 14 (fourteen) days. 6 each 3   ergocalciferol (VITAMIN D2) 1.25 MG (50000 UT) capsule Take 50,000 Units by mouth once a week. Monday     fexofenadine (ALLEGRA) 180 MG tablet Take 180 mg by mouth daily as needed for allergies or rhinitis.     folic acid (FOLVITE) 1 MG tablet Take 1 mg by mouth daily.     Furosemide (FUROSCIX) 80 MG/10ML CTKT Inject 1 Dose into the skin once for 1 dose. 1 each 0   furosemide (LASIX) 40 MG tablet Take 1 tablet (40 mg total) by mouth every morning. 90 tablet 3   gabapentin (NEURONTIN) 300 MG capsule Take 300 mg by mouth 3 (three) times daily as needed (pain).     hydrALAZINE (APRESOLINE) 25 MG tablet TAKE ONE TABLET BY MOUTH AT  BREAKFAST AND AT BEDTIME May take extra tab if needed if bp over 170/80 225 tablet 3   hydrOXYzine (VISTARIL) 50 MG capsule Take 50 mg by mouth 2 (two) times daily as needed for itching or anxiety.     Insulin Pen Needle (BD PEN NEEDLE NANO U/F) 32G X 4 MM MISC USE AS DIRECTED FIVE TIMES A DAY 100 each 10   insulin regular human CONCENTRATED (HUMULIN R U-500 KWIKPEN) 500 UNIT/ML KwikPen INJECT UNDER SKIN 30 MIN BEFORE MEALS - TOTAL OF 200 UNITS A DAY AS ADVISED 18 mL 1   Insulin Syringe-Needle U-100 26G X 1/2" 1 ML MISC Use daily for insulin injection as directed 100 each 3   Ipratropium-Albuterol (COMBIVENT RESPIMAT) 20-100 MCG/ACT AERS respimat Inhale 1 puff into the lungs every 6 (six) hours as needed for wheezing. 4 g 5   ipratropium-albuterol (DUONEB) 0.5-2.5 (3) MG/3ML SOLN Take 3 mLs by nebulization every 6 (six) hours as needed (Wheezing or dyspnea.). 360 mL 11   levothyroxine (SYNTHROID) 175 MCG tablet TAKE ONE TABLET BY MOUTH BEFORE BREAKFAST 90 tablet 3   metFORMIN (GLUCOPHAGE) 500 MG tablet TAKE 1 TABLET BY MOUTH EACH MORNING AND TAKE 1 TABLET EVERY DAY AT BEDTIME 180 tablet 3   metoprolol succinate (TOPROL-XL) 25 MG 24 hr tablet Take 1 tablet (25 mg total) by mouth at bedtime. Take with or immediately following a meal. 90 tablet 3   OXYGEN Inhale 3 L into the lungs at bedtime.     PARoxetine (PAXIL) 40 MG tablet Take 1 tablet (40 mg total) by mouth daily. 90 tablet 1   potassium chloride SA (KLOR-CON M20) 20 MEQ tablet Take 2 tab daily 180 tablet 3   promethazine (PHENERGAN) 25 MG tablet TAKE 1/2 TABLET BY MOUTH EVERY 8 HOURS AS NEEDED FOR NAUSEA AND VOMITING 30 tablet 0   Propylene Glycol (SYSTANE BALANCE) 0.6 % SOLN Place 1 drop into both eyes daily as needed (dry eyes).     Semaglutide, 1 MG/DOSE, (OZEMPIC, 1 MG/DOSE,) 4 MG/3ML SOPN Inject 1 mg into the skin once a week. 9 mL 3   simvastatin (ZOCOR) 20 MG tablet Take 1 tablet (20 mg total) by mouth daily at 6 PM. 90 tablet 3    tiZANidine (ZANAFLEX) 4 MG tablet Take 4 mg by mouth every 8 (eight) hours as needed for muscle spasms.     No current facility-administered medications on file prior to visit.   Allergies  Allergen Reactions  Gabapentin Swelling    Swelling in legs    Losartan Other (See Comments)    Myalgias and muscle cramping    Aricept [Donepezil Hcl]     Nausea/vomiting, low BP   Aripiprazole Other (See Comments)    Patient reports Hypoglycemia   Hydroxyzine     hallucinations   Oxycodone-Acetaminophen Itching   Oxycodone Itching   Sulfa Antibiotics Nausea Only and Rash   Sulfonamide Derivatives Nausea Only and Rash   Family History  Problem Relation Age of Onset   Colon polyps Mother    Diabetes Mother    Heart disease Mother    Dementia Mother    Heart disease Father    High blood pressure Father    Colon cancer Maternal Uncle        x 3   Stomach cancer Paternal Uncle    Breast cancer Other        great aunts x 5   Esophageal cancer Neg Hx    Rectal cancer Neg Hx    PE: BP 120/80   Pulse (!) 101   Ht 5\' 9"  (1.753 m)   Wt 292 lb 9.6 oz (132.7 kg)   SpO2 95%   BMI 43.21 kg/m  Wt Readings from Last 10 Encounters:  06/09/23 292 lb 9.6 oz (132.7 kg)  02/28/23 (!) 317 lb (143.8 kg)  02/27/23 (!) 320 lb (145.2 kg)  02/26/23 (!) 322 lb 9.6 oz (146.3 kg)  12/24/22 (!) 302 lb 6.4 oz (137.2 kg)  10/15/22 (!) 303 lb 9.6 oz (137.7 kg)  08/08/22 (!) 310 lb (140.6 kg)  07/24/22 (!) 307 lb (139.3 kg)  07/12/22 (!) 314 lb (142.4 kg)  05/10/22 (!) 305 lb 12.8 oz (138.7 kg)   Constitutional: overweight, in NAD Eyes:  EOMI, no exophthalmos ENT: no neck masses, no cervical lymphadenopathy Cardiovascular: Tachycardia, RR, No MRG Respiratory: CTA B Musculoskeletal: no deformities Skin:no rashes Neurological: no tremor with outstretched hands  ASSESSMENT: 1. DM2, insulin-dependent, uncontrolled, with complications - cerebrovascular disease with history of TIA 2012 - peripheral  neuropathy - gastroparesis - DKA 2015  2.  History of thyroid cancer  3.  Postsurgical hypothyroidism  PLAN:  1. Patient with longstanding, uncontrolled, type 2 diabetes, on weekly GLP-1 receptor agonist previously, but now off after lipase returned 131 approximately 2 weeks ago.  He is currently on metformin and U-500 insulin at last visit sugars are almost 100% above target so we adjusted the U-500 insulin regimen.  I advised him to also take a dose before workup with GI in the morning.  HbA1c was very high, at 12.1%.  However, since then, she recently had another HbA1c that was even higher, at 13%.  She cannot take Celebrex anymore 2/2 AKI and is on Prednisone for joint pains.  -At today's visit she is unfortunately off the CGM due to price.  She is also off Ozempic after lipase returned elevated and she is on prednisone, all contributing to higher blood sugars.  Also, upon questioning, she is not taking U-500 insulin before dinner.  She takes a much higher dose than recommended when she wakes up, then takes a lower dose before brunch and nothing before dinner.  She knows that her sugars are dropping too low in the afternoon so we backed off the a.m. U-500 insulin dose.  I will advise her to take a slightly higher dose of insulin before brunch and she definitely needs to take some before dinner.  I again recommended 16 to 18  units.  She is complaining about the fact that U-500 insulin is very slow in correcting high blood sugars.  Since she has Lyumjev at home, after discussion about the importance of not mixing them up, I recommended to take 10 to 20 units of Lyumjev insulin only for correction, if the sugars are high, even if she does not eat. -I also recommended to try to fast 1 day a week, drinking water only and not taking any of her diabetic medications during this day.  I am hoping that this will help reset and improve her insulin resistance.  She agrees to try this. -Will do metformin for  now. - I suggested to:  Patient Instructions  Please continue: - Metformin 500 mg 2x a day - U500 - 30 min before the meal: - 80-100 units before cappuccino - 50-60 units before brunch - 60-80 units before dinner  You may need to take 10-20 units of Lyumjev insulin only for correction.  Try to fast (only with water) 1 day a week. In this day, do not take insulin or Metformin.  Please continue Levothyroxine 175 mcg daily.   Take the thyroid hormone every day, with water, at least 30 minutes before breakfast, separated by at least 4 hours from: - acid reflux medications - calcium - iron - multivitamins   Please return in 1.5 months.       - advised to check sugars at different times of the day - 4x a day, rotating check times - advised for yearly eye exams >> she is UTD - return to clinic in 1.5 months  2.  History of thyroid cancer -She had a papillary thyroid cancer focus in the right thyroid lobe, measuring 0.5 cm.  No vascular, lymphatic or direct extension into the surrounding tissues -No neck compression symptoms -Neck ultrasound in 10/2020 showed no suspicious masses -She is most likely cured  3.  Postsurgical hypothyroidism - latest thyroid labs reviewed with pt. >> normal: Lab Results  Component Value Date   TSH 3.01 05/27/2023  - she continues on LT4 175 mcg daily - pt feels good on this dose. - we discussed about taking the thyroid hormone every day, with water, >30 minutes before breakfast, separated by >4 hours from acid reflux medications, calcium, iron, multivitamins. Pt. is taking it correctly.  Carlus Pavlov, MD PhD [facility] End[facility]

## 2023-06-10 MED ORDER — CELECOXIB 100 MG PO CAPS: 100.0000 mg | ORAL_CAPSULE | Freq: Two times a day (BID) | ORAL | 1 refills | Status: DC

## 2023-06-10 NOTE — Addendum Note (Signed)
Addended by: Pincus Sanes on: 06/10/2023 01:44 PM   Modules accepted: Orders

## 2023-06-12 DIAGNOSIS — J449 Chronic obstructive pulmonary disease, unspecified: Secondary | ICD-10-CM | POA: Diagnosis not present

## 2023-06-12 NOTE — Telephone Encounter (Signed)
Spoke to pt. Pt notified of Dr. Myrtie Neither recommendations: Pt has been previously scheduled to see Dr. Myrtie Neither on 06/23/2023 at 3:00 with Dr. Myrtie Neither.  Prescription sent to pharmacy. Pt made aware. Pt verbalized understanding with all questions answered.

## 2023-06-18 ENCOUNTER — Inpatient Hospital Stay: Admission: RE | Admit: 2023-06-18 | Payer: Medicare HMO | Source: Ambulatory Visit

## 2023-06-20 ENCOUNTER — Other Ambulatory Visit: Payer: Self-pay

## 2023-06-23 ENCOUNTER — Ambulatory Visit: Payer: Medicare HMO | Admitting: Gastroenterology

## 2023-06-23 ENCOUNTER — Other Ambulatory Visit: Payer: Self-pay | Admitting: Internal Medicine

## 2023-06-23 NOTE — Progress Notes (Deleted)
[facility] Progess Note:  History: Mary Acosta 06/23/2023  Referring provider: Pincus Sanes, MD  Reason for consult/chief complaint: No chief complaint on file.   Subjective  HPI: Summary of GI issues: "Mary Acosta was last seen in the office November 2022 for cryptogenic cirrhosis.  She also has chronic upper abdominal discomfort with bloating and nausea in the setting of poorly controlled diabetes.  At a prior office visit she was given a trial of metronidazole as empiric therapy for possible SIBO, and at follow-up she could not recall if it helped. Last screening ultrasound November 2022, last AFP normal at 2.7, also November 2022. Last variceal screening EGD March 2023 with following findings: Portal hypertensive gastropathy. - Normal examined duodenum. - No specimens collected. Normal appearance of LapBand compression in gastric cardia."  From November 2023 office visit: "Mary Acosta follows up today for cirrhosis.  She is struggling with her multiple health issues.  Since I last saw her, she was diagnosed with seronegative rheumatoid arthritis and initially put on methotrexate.  She had messaged [place] months ago asking if we thought that medicine was okay.  Might phone note indicates my concern about the increased risk of that as regards her cirrhosis, and we faxed my most recent office note to her rheumatologist.  Mary Acosta tells me she was then taken off the methotrexate but put on prednisone for some time, and that has caused her to gain a significant amount of weight and affect her diabetes.  She feels generally weak much of the time, has difficulty walking and feels off balance. Her digestive issues have remained stable, she has no active GI bleeding, has abdominal bloating as before."  HCC screening was scheduled  She was mailed a Cologuard recall last month but no result yet. _________________________________ Recent office visit with Dr. Elvera Lennox of  endocrinology details the ongoing struggles with Mary Acosta's poorly controlled diabetes.  No-show for clinic appointment 06/23/2023  ________      ***   ROS:  Review of Systems   Past Medical History: Past Medical History:  Diagnosis Date   AKI (acute kidney injury) (HCC) 01/2017   Allergy    seasonal   Anxiety    with panic attacks   Arthritis    "back; feet; hands; shoulders" (08/26/2014)   Asthma    Cataract    forming   Cervical cancer (HCC)    Chronic lower back pain    Chronic narcotic use    Chronic pain syndrome    [facility]   Cirrhosis (HCC)    Clostridium difficile infection 2017   COPD (chronic obstructive pulmonary disease) (HCC)    Daily headache    past hx   Depression    Diabetic neuropathy (HCC) 06/04/2017   feet  and legs , some in hands   DJD (degenerative joint disease)    Fatty liver disease, nonalcoholic    Fibromyalgia    Frequency of urination    HCAP (healthcare-associated pneumonia) 08/26/2014   History of TIA (transient ischemic attack) 11/01/2010   NO RESIDUAL   Hyperlipidemia    Hypertension    Hypothyroidism    IDDM (insulin dependent diabetes mellitus)    Insomnia    Lumbar stenosis    Macular degeneration    Memory difficulty 07/25/2016   Nocturia    OSA (obstructive sleep apnea)    NO CPAP SINCE WT LOSS   Osteoarthritis    with severe disease in knee   Oxygen deficiency  4  liters at bedtime only   Pneumonia "several times"   Polymyalgia rheumatica (HCC)    Scoliosis    Seasonal allergies    Stroke [facility])    TIA   Thyroid cancer (HCC)    Urgency of urination    Vaginal pain S/P SLING  FEB 2012     Past Surgical History: Past Surgical History:  Procedure Laterality Date   APPENDECTOMY  1982   BREAST EXCISIONAL BIOPSY Left 02/28/2005   Atypical Ductal Hyperplasia   CARDIAC CATHETERIZATION  09/04/2004   NORMAL CORONARY ANATOMY/ NORMAL LVF/ EF 60%   CARDIOVASCULAR STRESS TEST  12-27-2010  DR  [place]   ABNORMAL NUCLEAR STUDY W/ /MILD INFERIOR ISCHEMIA/ EF 69%/  CT HEART ANGIOGRAM ;  NO ACUTE FINDINGS   COLONOSCOPY     CRYOABLATION  05/16/2003   w/LEEP FOR ABNORMAL PAP SMEAR   CYSTOSCOPY  05/18/2012   Procedure: CYSTOSCOPY;  Surgeon: Martina Sinner, MD;  Location: [facility];  Service: Urology;  Laterality: N/A;  examination under anethesia   ESOPHAGOGASTRODUODENOSCOPY (EGD) WITH PROPOFOL N/A 09/03/2016   Procedure: ESOPHAGOGASTRODUODENOSCOPY (EGD) WITH PROPOFOL;  Surgeon: Sherrilyn Rist, MD;  Location: [facility];  Service: Gastroenterology;  Laterality: N/A;   ESOPHAGOGASTRODUODENOSCOPY (EGD) WITH PROPOFOL N/A 11/19/2021   Procedure: ESOPHAGOGASTRODUODENOSCOPY (EGD) WITH PROPOFOL;  Surgeon: Sherrilyn Rist, MD;  Location: [facility];  Service: Gastroenterology;  Laterality: N/A;  Varices screeing, cirrhosis   HYSTEROSCOPY WITH D & C  08/19/2007   PMB   KNEE ARTHROSCOPY W/ DEBRIDEMENT Left 03/29/2006   INTERNAL DERANGEMENT/ SEVERE DJD/ MENISCUS TEARS   LAPAROSCOPIC CHOLECYSTECTOMY  06/10/2002   LAPAROSCOPIC GASTRIC BANDING  03/01/2006   TRUNCAL VAGOTOMY/ PLACEMENT OF VG BAND   REVISION TOTAL KNEE ARTHROPLASTY Left 08-29-2008; 05/2009   TONSILLECTOMY  1969   TOTAL KNEE ARTHROPLASTY Left 01/23/2007   SEVERE DJD   TOTAL THYROIDECTOMY  11/22/2005   BILATERAL THYROID NODULES-- PAPILLARY CARCINOMA (0.5CM)/ ADENOMATOID NODULES   TRANSTHORACIC ECHOCARDIOGRAM  12/27/2010   LVSF NORMAL / EF 55-65%/ GRADE I DIASTOLIC DYSFUNCTION/ MILD MITRAL REGURG. / MILDLY DILATED LEFT ATRIUM/ MILDY INCREASED SYSTOLIC PRESSURE OF PULMONARY ARTERIES   TRANSVAGINAL SUBURETERAL TAPE/ SLING  09/28/2010   MIXED URINARY INCONTINENCE   TUBAL LIGATION  1983     Family History: Family History  Problem Relation Age of Onset   Colon polyps Mother    Diabetes Mother    Heart disease Mother    Dementia Mother    Heart disease Father    High blood pressure Father    Colon  cancer Maternal Uncle        x 3   Stomach cancer Paternal Uncle    Breast cancer Other        great aunts x 5   Esophageal cancer Neg Hx    Rectal cancer Neg Hx     Social History: Social History   Socioeconomic History   Marital status: Married    Spouse name: Not on file   Number of children: 2   Years of education: 12   Highest education level: Not on file  Occupational History   Occupation: disabled    Employer: UNEMPLOYED  Tobacco Use   Smoking status: Former    Current packs/day: 0.00    Average packs/day: 2.5 packs/day for 39.0 years (97.5 ttl pk-yrs)    Types: Cigarettes    Start date: 10/23/1971    Quit date: 10/22/2010    Years since quitting: 12.6  Smokeless tobacco: Never  Vaping Use   Vaping status: Never Used  Substance and Sexual Activity   Alcohol use: No    Alcohol/week: 0.0 standard drinks of alcohol   Drug use: No   Sexual activity: Not Currently  Other Topics Concern   Not on file  Social History Narrative   Lives at home w/ her husband    Right-handed   Caffeine: 1 cup of coffee per week + [employer]   Social Determinants of Health   Financial Resource Strain: Medium Risk (06/06/2022)   Overall Financial Resource Strain (CARDIA)    Difficulty of Paying Living Expenses: Somewhat hard  Food Insecurity: No Food Insecurity (06/06/2022)   Hunger Vital Sign    Worried About Running Out of Food in the Last Year: Never true    Ran Out of Food in the Last Year: Never true  Transportation Needs: No Transportation Needs (06/06/2022)   PRAPARE - Administrator, Civil Service (Medical): No    Lack of Transportation (Non-Medical): No  Physical Activity: Inactive (06/06/2022)   Exercise Vital Sign    Days of Exercise per Week: 0 days    Minutes of Exercise per Session: 0 min  Stress: No Stress Concern Present (06/06/2022)   [employer] of Occupational Health - Occupational Stress Questionnaire    Feeling of Stress : Only a little   Social Connections: Socially Integrated (06/06/2022)   Social Connection and Isolation Panel [NHANES]    Frequency of Communication with Friends and Family: More than three times a week    Frequency of Social Gatherings with Friends and Family: More than three times a week    Attends Religious Services: More than 4 times per year    Active Member of [employer] or Organizations: Yes    Attends Engineer, structural: More than 4 times per year    Marital Status: Married    Allergies: Allergies  Allergen Reactions   Gabapentin Swelling    Swelling in legs    Losartan Other (See Comments)    Myalgias and muscle cramping    Aricept [Donepezil Hcl]     Nausea/vomiting, low BP   Aripiprazole Other (See Comments)    Patient reports Hypoglycemia   Hydroxyzine     hallucinations   Oxycodone-Acetaminophen Itching   Oxycodone Itching   Sulfa Antibiotics Nausea Only and Rash   Sulfonamide Derivatives Nausea Only and Rash    Outpatient Meds: Current Outpatient Medications  Medication Sig Dispense Refill   Adalimumab (HUMIRA PEN) 40 MG/0.4ML PNKT Inject 40 mg into the skin every 14 (fourteen) days. Every other Thursday (Patient not taking: Reported on 06/09/2023)     albuterol (VENTOLIN HFA) 108 (90 Base) MCG/ACT inhaler INHALE TWO (2) PUFFS BY MOUTH EVERY 6 HOURS AS NEEDED FOR SHORTNESS OF BREATH AND WHEEZING 18 g 10   Albuterol Sulfate (PROAIR RESPICLICK) 108 (90 Base) MCG/ACT AEPB 1 puff as needed Inhalation every 4 hrs     amLODipine (NORVASC) 5 MG tablet TAKE 1 TABLET BY MOUTH EVERY MORNING AND EVERY DAY AT BEDTIME 60 tablet 10   ARIPiprazole (ABILIFY) 2 MG tablet Take 2 mg by mouth daily.     atomoxetine (STRATTERA) 40 MG capsule Take 40 mg by mouth daily.     Buprenorphine HCl 150 MCG FILM Place inside cheek.     celecoxib (CELEBREX) 100 MG capsule Take 1 capsule (100 mg total) by mouth 2 (two) times daily. 60 capsule 1   cloNIDine (CATAPRES) 0.1 MG tablet  Take 0.1 mg by mouth  every 8 (eight) hours as needed.     colchicine 0.6 MG tablet Take one tab bid x 2 days and then daily 10 tablet 0   Continuous Blood Gluc Sensor (FREESTYLE LIBRE 3 SENSOR) MISC 1 each by Does not apply route every 14 (fourteen) days. 6 each 3   ergocalciferol (VITAMIN D2) 1.25 MG (50000 UT) capsule Take 50,000 Units by mouth once a week. Monday     fexofenadine (ALLEGRA) 180 MG tablet Take 180 mg by mouth daily as needed for allergies or rhinitis.     folic acid (FOLVITE) 1 MG tablet Take 1 mg by mouth daily.     Furosemide (FUROSCIX) 80 MG/10ML CTKT Inject 1 Dose into the skin once for 1 dose. 1 each 0   furosemide (LASIX) 40 MG tablet Take 1 tablet (40 mg total) by mouth every morning. 90 tablet 3   gabapentin (NEURONTIN) 300 MG capsule Take 300 mg by mouth 3 (three) times daily as needed (pain).     hydrALAZINE (APRESOLINE) 25 MG tablet TAKE ONE TABLET BY MOUTH AT BREAKFAST AND AT BEDTIME May take extra tab if needed if bp over 170/80 225 tablet 3   hydrOXYzine (VISTARIL) 50 MG capsule Take 50 mg by mouth 2 (two) times daily as needed for itching or anxiety.     Insulin Pen Needle (BD PEN NEEDLE NANO U/F) 32G X 4 MM MISC USE AS DIRECTED FIVE TIMES A DAY 100 each 10   insulin regular human CONCENTRATED (HUMULIN R U-500 KWIKPEN) 500 UNIT/ML KwikPen INJECT UNDER SKIN 30 MIN BEFORE MEALS - TOTAL OF 200 UNITS A DAY AS ADVISED 18 mL 1   Insulin Syringe-Needle U-100 26G X 1/2" 1 ML MISC Use daily for insulin injection as directed 100 each 3   Ipratropium-Albuterol (COMBIVENT RESPIMAT) 20-100 MCG/ACT AERS respimat Inhale 1 puff into the lungs every 6 (six) hours as needed for wheezing. 4 g 5   ipratropium-albuterol (DUONEB) 0.5-2.5 (3) MG/3ML SOLN Take 3 mLs by nebulization every 6 (six) hours as needed (Wheezing or dyspnea.). 360 mL 11   levothyroxine (SYNTHROID) 175 MCG tablet TAKE ONE TABLET BY MOUTH BEFORE BREAKFAST 90 tablet 3   metFORMIN (GLUCOPHAGE) 500 MG tablet TAKE 1 TABLET BY MOUTH EACH  MORNING AND TAKE 1 TABLET EVERY DAY AT BEDTIME 180 tablet 3   metoprolol succinate (TOPROL-XL) 25 MG 24 hr tablet Take 1 tablet (25 mg total) by mouth at bedtime. Take with or immediately following a meal. 90 tablet 3   OXYGEN Inhale 3 L into the lungs at bedtime.     PARoxetine (PAXIL) 40 MG tablet Take 1 tablet (40 mg total) by mouth daily. 90 tablet 1   potassium chloride SA (KLOR-CON M20) 20 MEQ tablet Take 2 tab daily 180 tablet 3   promethazine (PHENERGAN) 25 MG tablet TAKE 1/2 TABLET BY MOUTH EVERY 8 HOURS AS NEEDED FOR NAUSEA AND VOMITING 30 tablet 2   Propylene Glycol (SYSTANE BALANCE) 0.6 % SOLN Place 1 drop into both eyes daily as needed (dry eyes).     Semaglutide, 1 MG/DOSE, (OZEMPIC, 1 MG/DOSE,) 4 MG/3ML SOPN Inject 1 mg into the skin once a week. 9 mL 3   simvastatin (ZOCOR) 20 MG tablet Take 1 tablet (20 mg total) by mouth daily at 6 PM. 90 tablet 3   tiZANidine (ZANAFLEX) 4 MG tablet Take 4 mg by mouth every 8 (eight) hours as needed for muscle spasms.     No current  facility-administered medications for this visit.      ___________________________________________________________________ Objective   Exam:  There were no vitals taken for this visit. Wt Readings from Last 3 Encounters:  06/09/23 292 lb 9.6 oz (132.7 kg)  02/28/23 (!) 317 lb (143.8 kg)  02/27/23 (!) 320 lb (145.2 kg)    General: ***  Eyes: sclera anicteric, no redness ENT: oral mucosa moist without lesions, no cervical or supraclavicular lymphadenopathy CV: ***, no JVD, no peripheral edema Resp: clear to auscultation bilaterally, normal RR and effort noted GI: soft, *** tenderness, with active bowel sounds. No guarding or palpable organomegaly noted. Skin; warm and dry, no rash or jaundice noted Neuro: awake, alert and oriented x 3. Normal gross motor function and fluent speech  Labs:     Latest Ref Rng & Units 05/27/2023    3:37 PM 02/27/2023   11:59 AM 10/15/2022   12:39 PM  CBC  WBC 4.0 -  10.5 K/uL 7.4  10.4  11.9   Hemoglobin 12.0 - 15.0 g/dL 16.1  09.6  04.5   Hematocrit 36.0 - 46.0 % 40.5  40.8  44.0   Platelets 150.0 - 400.0 K/uL 172.0  137  203       Latest Ref Rng & Units 05/27/2023    3:37 PM 02/27/2023   11:59 AM 02/26/2023    1:17 PM  CMP  Glucose 70 - 99 mg/dL 409  811  914   BUN 6 - 23 mg/dL 18  21  17    Creatinine 0.40 - 1.20 mg/dL 7.82  9.56  2.13   Sodium 135 - 145 mEq/L 127  128  135   Potassium 3.5 - 5.1 mEq/L 5.1  4.5  6.0   Chloride 96 - 112 mEq/L 90  92  92   CO2 19 - 32 mEq/L 26  25  22    Calcium 8.4 - 10.5 mg/dL 9.0  8.5  8.9   Total Protein 6.0 - 8.3 g/dL 7.0     Total Bilirubin 0.2 - 1.2 mg/dL 0.6     Alkaline Phos 39 - 117 U/L 98     AST 0 - 37 U/L 16     ALT 0 - 35 U/L 13        Radiologic Studies:  CLINICAL DATA:  Cirrhosis   EXAM: DUPLEX ULTRASOUND OF LIVER   TECHNIQUE: Color and duplex Doppler ultrasound was performed to evaluate the hepatic in-flow and out-flow vessels.   COMPARISON:  CT abdomen/pelvis 02/05/2022   FINDINGS: Liver: Enlarged heterogeneous liver. Mildly increased echogenicity of the hepatic parenchyma. The contour appears nodular. The adjacent renal parenchyma is hypoechoic in comparison. No focal lesion, mass or intrahepatic biliary ductal dilatation.   Main Portal Vein size: 1.4 cm   Portal Vein Velocities   Main Prox:  24 cm/sec with normal hepatopetal directional flow.   Main Mid: 23 cm/sec with normal hepatopetal directional flow.   Main Dist:  31 cm/sec with normal hepatopetal directional flow. Right: 20 cm/sec with normal hepatopetal directional flow. Left: 7 cm/sec with normal hepatopetal directional flow.   Hepatic Vein Velocities   Unable to visualize the hepatic veins due to high-riding liver and poor penetration.   IVC: Present and patent with normal respiratory phasicity.   Hepatic Artery Velocity:  28 cm/sec   Splenic Vein Velocity:  23 cm/sec   Spleen: 15 cm x 5.9 cm x 5.0 cm  with a total volume of 285 cm^3 (411 cm^3 is upper limit normal)   Portal  Vein Occlusion/Thrombus: No   Splenic Vein Occlusion/Thrombus: No   Ascites: None   Varices: None   IMPRESSION: 1. Hepatic steatosis with probable cirrhosis. 2. The portal veins are patent with normal hepatopetal directional flow. Velocities are low but this may be a spurious finding. 3. Limited examination secondary to patient body habitus. 4. Unable to visualize hepatic veins due to patient body habitus and high-riding liver.   Signed,   Sterling Big, MD, RPVI   [facility]   [facility]     Electronically Signed   By: Malachy Moan M.D.   On: 07/10/2022 14:49  Assessment: No diagnosis found.  ***  Plan:  ***  Thank you for the courtesy of this consult.  Please call me with any questions or concerns.  Charlie Pitter III  CC: Referring provider noted above

## 2023-06-26 ENCOUNTER — Other Ambulatory Visit: Payer: Self-pay | Admitting: Internal Medicine

## 2023-06-26 DIAGNOSIS — G4733 Obstructive sleep apnea (adult) (pediatric): Secondary | ICD-10-CM

## 2023-06-26 DIAGNOSIS — I10 Essential (primary) hypertension: Secondary | ICD-10-CM

## 2023-07-04 ENCOUNTER — Ambulatory Visit: Payer: Medicare HMO | Admitting: Physician Assistant

## 2023-07-07 DIAGNOSIS — M533 Sacrococcygeal disorders, not elsewhere classified: Secondary | ICD-10-CM | POA: Diagnosis not present

## 2023-07-07 DIAGNOSIS — Z0289 Encounter for other administrative examinations: Secondary | ICD-10-CM | POA: Diagnosis not present

## 2023-07-07 DIAGNOSIS — E1142 Type 2 diabetes mellitus with diabetic polyneuropathy: Secondary | ICD-10-CM | POA: Diagnosis not present

## 2023-07-07 DIAGNOSIS — M51379 Other intervertebral disc degeneration, lumbosacral region without mention of lumbar back pain or lower extremity pain: Secondary | ICD-10-CM | POA: Diagnosis not present

## 2023-07-07 DIAGNOSIS — Z79891 Long term (current) use of opiate analgesic: Secondary | ICD-10-CM | POA: Diagnosis not present

## 2023-07-07 DIAGNOSIS — G894 Chronic pain syndrome: Secondary | ICD-10-CM | POA: Diagnosis not present

## 2023-07-13 DIAGNOSIS — J449 Chronic obstructive pulmonary disease, unspecified: Secondary | ICD-10-CM | POA: Diagnosis not present

## 2023-07-18 ENCOUNTER — Other Ambulatory Visit: Payer: Self-pay

## 2023-07-18 ENCOUNTER — Telehealth: Payer: Self-pay | Admitting: Internal Medicine

## 2023-07-18 ENCOUNTER — Other Ambulatory Visit: Payer: Self-pay | Admitting: Internal Medicine

## 2023-07-18 DIAGNOSIS — F1911 Other psychoactive substance abuse, in remission: Secondary | ICD-10-CM | POA: Diagnosis not present

## 2023-07-18 DIAGNOSIS — F4323 Adjustment disorder with mixed anxiety and depressed mood: Secondary | ICD-10-CM | POA: Diagnosis not present

## 2023-07-18 DIAGNOSIS — F418 Other specified anxiety disorders: Secondary | ICD-10-CM | POA: Diagnosis not present

## 2023-07-18 DIAGNOSIS — F332 Major depressive disorder, recurrent severe without psychotic features: Secondary | ICD-10-CM | POA: Diagnosis not present

## 2023-07-18 MED ORDER — COLCHICINE 0.6 MG PO TABS: ORAL_TABLET | ORAL | 0 refills | Status: DC

## 2023-07-18 NOTE — Telephone Encounter (Signed)
Message sent to [employer] for follow up.

## 2023-07-18 NOTE — Telephone Encounter (Signed)
Prescription Request  07/18/2023  LOV: 07/24/2022  What is the name of the medication or equipment?  colchicine 0.6 MG tablet [   Have you contacted your pharmacy to request a refill? No   Which pharmacy would you like this sent to?    [id number] - [place], [place] - [address] AT [facility] [address] [place] [address] Phone: [phone] Fax: [phone]  Patient notified that their request is being sent to the clinical staff for review and that they should receive a response within 2 business days.   Please advise at Mobile [phone] (mobile)

## 2023-07-18 NOTE — Telephone Encounter (Signed)
Pt called wanting to know where the request for the walker  was sent so she can call and check up on the status. Please advise.

## 2023-07-18 NOTE — Telephone Encounter (Signed)
Faxed today

## 2023-07-21 ENCOUNTER — Ambulatory Visit: Payer: Medicare HMO | Admitting: Internal Medicine

## 2023-07-22 ENCOUNTER — Ambulatory Visit: Payer: Medicare HMO | Admitting: Family Medicine

## 2023-07-22 NOTE — Progress Notes (Deleted)
   Rubin Payor, PhD, LAT, ATC acting as a scribe for Clementeen Graham, MD.  Mary Acosta is a 67 y.o. female who presents to [facility] at [facility] today for bilateral knee pain x ***. Pt locates pain to ***  Knee swelling: Mechanical symptoms: Aggravates: Treatments tried:  Pertinent review of systems: ***  Relevant historical information: ***   Exam:  There were no vitals taken for this visit. General: Well Developed, well nourished, and in no acute distress.   MSK: ***    Lab and Radiology Results No results found. However, due to the size of the patient record, not all encounters were searched. Please check Results Review for a complete set of results. No results found.     Assessment and Plan: 67 y.o. female with ***   PDMP not reviewed this encounter. No orders of the defined types were placed in this encounter.  No orders of the defined types were placed in this encounter.    Discussed warning signs or symptoms. Please see discharge instructions. Patient expresses understanding.   ***

## 2023-07-31 ENCOUNTER — Ambulatory Visit: Payer: Medicare HMO | Admitting: Internal Medicine

## 2023-07-31 NOTE — Progress Notes (Unsigned)
Patient ID: Mary Acosta, female   DOB: [birth year], 67 y.o.   MRN: [id number]   HPI: Mary Acosta is a 67 y.o.-year-old female, initially referred by her PCP, Dr. Lawerance Bach, returning for follow-up for DM2, dx in 2000, insulin-dependent since 2011, uncontrolled, with long-term complications (peripheral neuropathy, gastroparesis, cerebrovascular disease with history of TIA 2012, DKA 2015) and also history of thyroid cancer and postsurgical hypothyroidism.  She previously saw my colleague, Dr. Everardo All, up to 07/2019.  Last visit with me 2 months ago.  She is here with her son-in-law.  Interim hx: + increased urination, nausea.  No blurry vision, chest pain.  She lost approximately 10 pounds net before last visit, possibly due to glucotoxicity.  DM2: Reviewed HbA1c levels: Lab Results  Component Value Date   HGBA1C 13.0 (H) 05/27/2023   HGBA1C 12.1 (A) 12/24/2022   HGBA1C 10.6 (A) 08/08/2022   HGBA1C 11.9 (A) 05/10/2022   HGBA1C 8.1 (A) 10/09/2021   HGBA1C 8.6 (A) 06/04/2021   HGBA1C 6.9 (A) 01/15/2021   HGBA1C 7.6 (A) 10/13/2020   HGBA1C 8.1 (A) 07/05/2020   HGBA1C 10.4 (H) 05/03/2020   She was previously on: - Basaglar 80 units at bedtime -[place] >> stopped >> once a week >> 50 >> 80 >> 160 units daily >> 80 units twice a day  - Metformin 1000 mg with dinner >> 500 mg 2x a day - Ozempic 0.5 >> 1 mg weekly - - Lyumjev 10-20 >> 20-25 units before meals >> muscle cramps >> decreased: 14 units Prev. On Rybelsus 3 mg before breakfast-see patient assistance pgm  She was on N and R insulin in the past. She was on metformin in the past >> did not work. She was prev. On Toujeo 180 units daily.  At last visit she was on: - Metformin 500 mg 2x a day - Ozempic 1 mg weekly in a.m. >> stopped 2 weeks prior to our appointment on 05/27/2023 2/2 high lipase - U500 - 30 min before the meal: - 30 >> she is actually taking 120-170 units before cappuccino! - 40-60 >> 40-50 units  before brunch - 60-80 >> 0 units before dinner  We changed to: - U500 - 30 min before the meal: - 80-100 units before cappuccino - 50-60 units before brunch - 60-80 units before dinner You may need to take 10-20 units of Lyumjev insulin only for correction.  Pt checks her sugars 4x  times a day, prev. w daily/ CGM (with receiver) - not affordable: - am: 400-550 - 2h after b'fast: n/c - lunch: 250-400 - 2h after lunch: n/c - dinner: 350 - 2h after dinner: 450-500 - bedtime: n/c  Previously:   Lowest sugar was 226 >> 250; she has hypoglycemia awareness in the 70s. Highest sugar was 500s >> 561 >> Hi.  Glucometer: AccuChek, Prodigy  Pt's meals are: - Breakfast: bacon or sausage and eggs, cereal >> grits, or grilled cheese - Lunch: salad; hot dog, sandwiches >> ham and cheese sandwich or skips - Dinner: meat + veggies  >> larger - Snacks: not so much anymore Previously drinking Kool-Aid, now stopped.  However, she is still having that she is drinking High C juice.   She is status post gastric banding with vagotomy in 2008.  She lost 148 pounds afterwards but then gained more than 100 pounds back.  No h/o pancreatitis or FH of MTC.  However, she had a high lipase in 05/2023.  Now off Ozempic.  No  CKD, last BUN/creatinine:  Lab Results  Component Value Date   BUN 18 05/27/2023   BUN 21 02/27/2023   CREATININE 1.37 (H) 05/27/2023   CREATININE 1.41 (H) 02/27/2023   Lab Results  Component Value Date   MICRALBCREAT 1.5 05/27/2023   MICRALBCREAT 0.7 09/19/2017   MICRALBCREAT 0.4 02/05/2017   MICRALBCREAT 0.6 02/01/2016   MICRALBCREAT 1.2 12/07/2015  Not on ACE inhibitor/ARB.  + HL; last set of lipids: Lab Results  Component Value Date   CHOL 163 05/27/2023   HDL 32.30 (L) 05/27/2023   LDLCALC 88 05/27/2023   TRIG 215.0 (H) 05/27/2023   CHOLHDL 5 05/27/2023  On Zocor 20, Zetia 10.  - last eye exam was in 2023 reportedly: no DR. Stopped IO injections for macular  degeneration.  -She has pain, numbness, tingling in her feet.  Last foot exam 12/24/2022.  On ASA 81.  Pt has FH of DM in mother.  Thyroid cancer:  Reviewed her thyroid cancer history: She is status post total thyroidectomy - [place] Sx 2007. 11/22/2005: THYROID, THYROIDECTOMY:   - PAPILLARY THYROID CARCINOMA (0.5 CM).   - ADENOMATOID NODULES.    COMMENT   ONCOLOGY TABLE-THYROID    1. Maximum tumor size (cm): 0.5 cm   2. Tumor location: Right mid lobe   3. Multifocality: Not identified   4. Histology: Papillary thyroid carcinoma   5. Margins: Negative   6. Capsular invasion: Present   7. Extrathyroidal extension: Not identified   8. Vascular/Lymphatic invasion: Not identified   9. Lymph nodes: N/A   10. TNM code: pT1, pNX, pMX   11. Comments: Examination of the thyroidectomy specimen   grossly demonstrates multiple nodules. Histologic evaluation   demonstrates numerous adenomatoid nodules. One nodule measuring   0.5 cm demonstrates features compatible with papillary thyroid   carcinoma. The surgical margins are negative. (MS:jy) 11/25/05   Neck U/S (10/25/2020): no abnormal neck masses  Postsurgical hypothyroidism:  She takes 175 mcg levothyroxine daily (dose increased 11/2022): - no missed doses anymore - in am - fasting - at least 30 min from b'fast - no calcium - no iron - no multivitamins - no PPIs - not on Biotin  Reviewed her TFTs: Lab Results  Component Value Date   TSH 3.01 05/27/2023   TSH 10.66 (H) 12/24/2022   TSH 4.62 05/10/2022   TSH 0.13 (L) 10/09/2021   TSH 0.51 02/02/2021   TSH 0.14 (L) 08/04/2020   TSH 4.42 05/03/2020   TSH 1.50 10/22/2019   TSH 1.09 07/20/2019   TSH 1.32 04/01/2019   TSH 0.55 09/04/2018   TSH 0.90 12/26/2017   TSH 0.113 (L) 12/08/2017   TSH 0.22 (L) 09/19/2017   TSH 0.62 06/13/2017   Pt denies: - feeling nodules in neck - hoarseness - dysphagia - choking  She also has a history of COPD, OSA,  fibromyalgia, depression, fatty liver-cirrhosis, DDD, h/o vb fx, PMR. She is seen in pain clinic in [facility].  She is on Neurontin and amitriptyline. She lost 2 sons: 2020 and 09/2020.    ROS: + see HPI  I reviewed pt's medications, allergies, PMH, social hx, family hx, and changes were documented in the history of present illness. Otherwise, unchanged from my initial visit note.  Past Medical History:  Diagnosis Date   AKI (acute kidney injury) (HCC) 01/2017   Allergy    seasonal   Anxiety    with panic attacks   Arthritis    "back; feet; hands; shoulders" (08/26/2014)  Asthma    Cataract    forming   Cervical cancer (HCC)    Chronic lower back pain    Chronic narcotic use    Chronic pain syndrome    [facility]   Cirrhosis (HCC)    Clostridium difficile infection 2017   COPD (chronic obstructive pulmonary disease) (HCC)    Daily headache    past hx   Depression    Diabetic neuropathy (HCC) 06/04/2017   feet  and legs , some in hands   DJD (degenerative joint disease)    Fatty liver disease, nonalcoholic    Fibromyalgia    Frequency of urination    HCAP (healthcare-associated pneumonia) 08/26/2014   History of TIA (transient ischemic attack) 11/01/2010   NO RESIDUAL   Hyperlipidemia    Hypertension    Hypothyroidism    IDDM (insulin dependent diabetes mellitus)    Insomnia    Lumbar stenosis    Macular degeneration    Memory difficulty 07/25/2016   Nocturia    OSA (obstructive sleep apnea)    NO CPAP SINCE WT LOSS   Osteoarthritis    with severe disease in knee   Oxygen deficiency    4  liters at bedtime only   Pneumonia "several times"   Polymyalgia rheumatica (HCC)    Scoliosis    Seasonal allergies    Stroke [facility])    TIA   Thyroid cancer (HCC)    Urgency of urination    Vaginal pain S/P SLING  FEB 2012   Past Surgical History:  Procedure Laterality Date   APPENDECTOMY  1982   BREAST EXCISIONAL BIOPSY Left 02/28/2005   Atypical  Ductal Hyperplasia   CARDIAC CATHETERIZATION  09/04/2004   NORMAL CORONARY ANATOMY/ NORMAL LVF/ EF 60%   CARDIOVASCULAR STRESS TEST  12-27-2010  DR [place]   ABNORMAL NUCLEAR STUDY W/ /MILD INFERIOR ISCHEMIA/ EF 69%/  CT HEART ANGIOGRAM ;  NO ACUTE FINDINGS   COLONOSCOPY     CRYOABLATION  05/16/2003   w/LEEP FOR ABNORMAL PAP SMEAR   CYSTOSCOPY  05/18/2012   Procedure: CYSTOSCOPY;  Surgeon: Martina Sinner, MD;  Location: [facility] ;  Service: Urology;  Laterality: N/A;  examination under anethesia   ESOPHAGOGASTRODUODENOSCOPY (EGD) WITH PROPOFOL N/A 09/03/2016   Procedure: ESOPHAGOGASTRODUODENOSCOPY (EGD) WITH PROPOFOL;  Surgeon: Sherrilyn Rist, MD;  Location: [facility];  Service: Gastroenterology;  Laterality: N/A;   ESOPHAGOGASTRODUODENOSCOPY (EGD) WITH PROPOFOL N/A 11/19/2021   Procedure: ESOPHAGOGASTRODUODENOSCOPY (EGD) WITH PROPOFOL;  Surgeon: Sherrilyn Rist, MD;  Location: [facility];  Service: Gastroenterology;  Laterality: N/A;  Varices screeing, cirrhosis   HYSTEROSCOPY WITH D & C  08/19/2007   PMB   KNEE ARTHROSCOPY W/ DEBRIDEMENT Left 03/29/2006   INTERNAL DERANGEMENT/ SEVERE DJD/ MENISCUS TEARS   LAPAROSCOPIC CHOLECYSTECTOMY  06/10/2002   LAPAROSCOPIC GASTRIC BANDING  03/01/2006   TRUNCAL VAGOTOMY/ PLACEMENT OF VG BAND   REVISION TOTAL KNEE ARTHROPLASTY Left 08-29-2008; 05/2009   TONSILLECTOMY  1969   TOTAL KNEE ARTHROPLASTY Left 01/23/2007   SEVERE DJD   TOTAL THYROIDECTOMY  11/22/2005   BILATERAL THYROID NODULES-- PAPILLARY CARCINOMA (0.5CM)/ ADENOMATOID NODULES   TRANSTHORACIC ECHOCARDIOGRAM  12/27/2010   LVSF NORMAL / EF 55-65%/ GRADE I DIASTOLIC DYSFUNCTION/ MILD MITRAL REGURG. / MILDLY DILATED LEFT ATRIUM/ MILDY INCREASED SYSTOLIC PRESSURE OF PULMONARY ARTERIES   TRANSVAGINAL SUBURETERAL TAPE/ SLING  09/28/2010   MIXED URINARY INCONTINENCE   TUBAL LIGATION  1983   Social History   Socioeconomic History  Marital status: Married     Spouse name: Not on file   Number of children: 2   Years of education: 56   Highest education level: Not on file  Occupational History   Occupation: disabled    Employer: UNEMPLOYED  Tobacco Use   Smoking status: Former    Current packs/day: 0.00    Average packs/day: 2.5 packs/day for 39.0 years (97.5 ttl pk-yrs)    Types: Cigarettes    Start date: 10/23/1971    Quit date: 10/22/2010    Years since quitting: 12.7   Smokeless tobacco: Never  Vaping Use   Vaping status: Never Used  Substance and Sexual Activity   Alcohol use: No    Alcohol/week: 0.0 standard drinks of alcohol   Drug use: No   Sexual activity: Not Currently  Other Topics Concern   Not on file  Social History Narrative   Lives at home w/ her husband    Right-handed   Caffeine: 1 cup of coffee per week + [employer]   Social Determinants of Health   Financial Resource Strain: Medium Risk (06/06/2022)   Overall Financial Resource Strain (CARDIA)    Difficulty of Paying Living Expenses: Somewhat hard  Food Insecurity: No Food Insecurity (06/06/2022)   Hunger Vital Sign    Worried About Running Out of Food in the Last Year: Never true    Ran Out of Food in the Last Year: Never true  Transportation Needs: No Transportation Needs (06/06/2022)   PRAPARE - Administrator, Civil Service (Medical): No    Lack of Transportation (Non-Medical): No  Physical Activity: Inactive (06/06/2022)   Exercise Vital Sign    Days of Exercise per Week: 0 days    Minutes of Exercise per Session: 0 min  Stress: No Stress Concern Present (06/06/2022)   [employer] of Occupational Health - Occupational Stress Questionnaire    Feeling of Stress : Only a little  Social Connections: Socially Integrated (06/06/2022)   Social Connection and Isolation Panel [NHANES]    Frequency of Communication with Friends and Family: More than three times a week    Frequency of Social Gatherings with Friends and Family: More than three  times a week    Attends Religious Services: More than 4 times per year    Active Member of [employer] or Organizations: Yes    Attends Engineer, structural: More than 4 times per year    Marital Status: Married  Catering manager Violence: Not At Risk (06/06/2022)   Humiliation, Afraid, Rape, and Kick questionnaire    Fear of Current or Ex-Partner: No    Emotionally Abused: No    Physically Abused: No    Sexually Abused: No   Current Outpatient Medications on File Prior to Visit  Medication Sig Dispense Refill   Adalimumab (HUMIRA PEN) 40 MG/0.4ML PNKT Inject 40 mg into the skin every 14 (fourteen) days. Every other Thursday (Patient not taking: Reported on 06/09/2023)     albuterol (VENTOLIN HFA) 108 (90 Base) MCG/ACT inhaler INHALE TWO (2) PUFFS BY MOUTH EVERY 6 HOURS AS NEEDED FOR SHORTNESS OF BREATH AND WHEEZING 18 g 10   Albuterol Sulfate (PROAIR RESPICLICK) 108 (90 Base) MCG/ACT AEPB 1 puff as needed Inhalation every 4 hrs     amLODipine (NORVASC) 5 MG tablet TAKE 1 TABLET BY MOUTH EVERY MORNING AND EVERY DAY AT BEDTIME 60 tablet 10   ARIPiprazole (ABILIFY) 2 MG tablet Take 2 mg by mouth daily.     atomoxetine (  STRATTERA) 40 MG capsule Take 40 mg by mouth daily.     Buprenorphine HCl 150 MCG FILM Place inside cheek.     celecoxib (CELEBREX) 100 MG capsule Take 1 capsule (100 mg total) by mouth 2 (two) times daily. 60 capsule 1   cloNIDine (CATAPRES) 0.1 MG tablet Take 0.1 mg by mouth every 8 (eight) hours as needed.     colchicine 0.6 MG tablet Take one tab bid x 2 days and then daily 10 tablet 0   Continuous Blood Gluc Sensor (FREESTYLE LIBRE 3 SENSOR) MISC 1 each by Does not apply route every 14 (fourteen) days. 6 each 3   ergocalciferol (VITAMIN D2) 1.25 MG (50000 UT) capsule Take 50,000 Units by mouth once a week. Monday     fexofenadine (ALLEGRA) 180 MG tablet Take 180 mg by mouth daily as needed for allergies or rhinitis.     folic acid (FOLVITE) 1 MG tablet Take 1 mg by  mouth daily.     Furosemide (FUROSCIX) 80 MG/10ML CTKT Inject 1 Dose into the skin once for 1 dose. 1 each 0   furosemide (LASIX) 40 MG tablet TAKE 1 TABLET BY MOUTH EACH MORNING 30 tablet 10   gabapentin (NEURONTIN) 300 MG capsule Take 300 mg by mouth 3 (three) times daily as needed (pain).     hydrALAZINE (APRESOLINE) 25 MG tablet TAKE ONE TABLET BY MOUTH AT BREAKFAST AND AT BEDTIME May take extra tab if needed if bp over 170/80 225 tablet 3   hydrOXYzine (VISTARIL) 50 MG capsule Take 50 mg by mouth 2 (two) times daily as needed for itching or anxiety.     Insulin Pen Needle (BD PEN NEEDLE NANO U/F) 32G X 4 MM MISC USE AS DIRECTED FIVE TIMES A DAY 100 each 10   insulin regular human CONCENTRATED (HUMULIN R U-500 KWIKPEN) 500 UNIT/ML KwikPen INJECT UNDER SKIN 30 MIN BEFORE MEALS - TOTAL OF 200 UNITS A DAY AS ADVISED 18 mL 1   Insulin Syringe-Needle U-100 26G X 1/2" 1 ML MISC Use daily for insulin injection as directed 100 each 3   Ipratropium-Albuterol (COMBIVENT RESPIMAT) 20-100 MCG/ACT AERS respimat Inhale 1 puff into the lungs every 6 (six) hours as needed for wheezing. 4 g 5   ipratropium-albuterol (DUONEB) 0.5-2.5 (3) MG/3ML SOLN Take 3 mLs by nebulization every 6 (six) hours as needed (Wheezing or dyspnea.). 360 mL 11   levothyroxine (SYNTHROID) 175 MCG tablet TAKE ONE TABLET BY MOUTH BEFORE BREAKFAST 90 tablet 3   metFORMIN (GLUCOPHAGE) 500 MG tablet TAKE 1 TABLET BY MOUTH EACH MORNING AND TAKE 1 TABLET EVERY DAY AT BEDTIME 180 tablet 3   metoprolol succinate (TOPROL-XL) 25 MG 24 hr tablet Take 1 tablet (25 mg total) by mouth at bedtime. Take with or immediately following a meal. 90 tablet 3   OXYGEN Inhale 3 L into the lungs at bedtime.     PARoxetine (PAXIL) 40 MG tablet Take 1 tablet (40 mg total) by mouth daily. 90 tablet 1   potassium chloride SA (KLOR-CON M20) 20 MEQ tablet Take 2 tab daily 180 tablet 3   promethazine (PHENERGAN) 25 MG tablet TAKE 1/2 TABLET BY MOUTH EVERY 8 HOURS AS  NEEDED FOR NAUSEA AND VOMITING 30 tablet 2   Propylene Glycol (SYSTANE BALANCE) 0.6 % SOLN Place 1 drop into both eyes daily as needed (dry eyes).     Semaglutide, 1 MG/DOSE, (OZEMPIC, 1 MG/DOSE,) 4 MG/3ML SOPN Inject 1 mg into the skin once a week. 9 mL  3   simvastatin (ZOCOR) 20 MG tablet Take 1 tablet (20 mg total) by mouth daily at 6 PM. 90 tablet 3   tiZANidine (ZANAFLEX) 4 MG tablet Take 4 mg by mouth every 8 (eight) hours as needed for muscle spasms.     No current facility-administered medications on file prior to visit.   Allergies  Allergen Reactions   Gabapentin Swelling    Swelling in legs    Losartan Other (See Comments)    Myalgias and muscle cramping    Aricept [Donepezil Hcl]     Nausea/vomiting, low BP   Aripiprazole Other (See Comments)    Patient reports Hypoglycemia   Hydroxyzine     hallucinations   Oxycodone-Acetaminophen Itching   Oxycodone Itching   Sulfa Antibiotics Nausea Only and Rash   Sulfonamide Derivatives Nausea Only and Rash   Family History  Problem Relation Age of Onset   Colon polyps Mother    Diabetes Mother    Heart disease Mother    Dementia Mother    Heart disease Father    High blood pressure Father    Colon cancer Maternal Uncle        x 3   Stomach cancer Paternal Uncle    Breast cancer Other        great aunts x 5   Esophageal cancer Neg Hx    Rectal cancer Neg Hx    PE: There were no vitals taken for this visit. Wt Readings from Last 10 Encounters:  06/09/23 292 lb 9.6 oz (132.7 kg)  02/28/23 (!) 317 lb (143.8 kg)  02/27/23 (!) 320 lb (145.2 kg)  02/26/23 (!) 322 lb 9.6 oz (146.3 kg)  12/24/22 (!) 302 lb 6.4 oz (137.2 kg)  10/15/22 (!) 303 lb 9.6 oz (137.7 kg)  08/08/22 (!) 310 lb (140.6 kg)  07/24/22 (!) 307 lb (139.3 kg)  07/12/22 (!) 314 lb (142.4 kg)  05/10/22 (!) 305 lb 12.8 oz (138.7 kg)   Constitutional: overweight, in NAD Eyes:  EOMI, no exophthalmos ENT: no neck masses, no cervical  lymphadenopathy Cardiovascular: RRR, No MRG Respiratory: CTA B Musculoskeletal: no deformities Skin:no rashes Neurological: no tremor with outstretched hands  ASSESSMENT: 1. DM2, insulin-dependent, uncontrolled, with complications - cerebrovascular disease with history of TIA 2012 - peripheral neuropathy - gastroparesis - DKA 2015  2.  History of thyroid cancer  3.  Postsurgical hypothyroidism  PLAN:  1. Patient with longstanding, uncontrolled, type 2 diabetes, with high insulin resistance, on U-500 concentrated insulin, metformin, and ultra rapid acting insulin for correction.  She was previously on GLP-1 receptor agonist but this was stopped after lipase returned elevated, at 131 earlier this year.  At last visit, sugars were very high after stopping Ozempic and starting prednisone.  We increased her U-500 insulin before brunch and also recommended to take some before dinner.  She was complaining about U-500 insulin being very slow in correcting high blood sugars so we added Lyumjev for correction.  She had this at home.  I also recommended to try to do water fast 1 day a week and not taking any of her diabetic medications that day.  This is in an effort to improve her insulin resistance she agreed with this.  HbA1c was very high, at 13.0%.  - I suggested to:  Patient Instructions  Please continue: - Metformin 500 mg 2x a day - U500 - 30 min before the meal: - 80-100 units before cappuccino - 50-60 units before brunch - 60-80 units  before dinner You may need to take 10-20 units of Lyumjev insulin only for correction.  Try to fast (only with water) 1 day a week. In this day, do not take insulin or Metformin.  Please continue Levothyroxine 175 mcg daily.   Take the thyroid hormone every day, with water, at least 30 minutes before breakfast, separated by at least 4 hours from: - acid reflux medications - calcium - iron - multivitamins   Please return in 1.5 months.       -  we checked her HbA1c: 7%  - advised to check sugars at different times of the day - 4x a day, rotating check times - advised for yearly eye exams >> she is UTD - return to clinic in 1.5 months  2.  History of thyroid cancer -She had a papillary thyroid cancer focus in the right thyroid lobe, measuring 0.5 cm.  There was no vascular, lymphatic, or direct extension into the surrounding tissues. -No neck compression symptoms -Neck ultrasound in 10/2020 showed no suspicious masses -She is most likely cured  3.  Postsurgical hypothyroidism - latest thyroid labs reviewed with pt. >> normal: Lab Results  Component Value Date   TSH 3.01 05/27/2023  - she continues on LT4 175 mcg daily - pt feels good on this dose. - we discussed about taking the thyroid hormone every day, with water, >30 minutes before breakfast, separated by >4 hours from acid reflux medications, calcium, iron, multivitamins. Pt. is taking it correctly.  Carlus Pavlov, MD PhD [facility]

## 2023-08-07 ENCOUNTER — Other Ambulatory Visit: Payer: Self-pay | Admitting: Internal Medicine

## 2023-08-11 ENCOUNTER — Other Ambulatory Visit: Payer: Self-pay | Admitting: Internal Medicine

## 2023-08-12 ENCOUNTER — Telehealth: Payer: Self-pay | Admitting: Internal Medicine

## 2023-08-12 ENCOUNTER — Other Ambulatory Visit: Payer: Self-pay | Admitting: Internal Medicine

## 2023-08-12 ENCOUNTER — Other Ambulatory Visit: Payer: Self-pay

## 2023-08-12 DIAGNOSIS — E1165 Type 2 diabetes mellitus with hyperglycemia: Secondary | ICD-10-CM

## 2023-08-12 DIAGNOSIS — J449 Chronic obstructive pulmonary disease, unspecified: Secondary | ICD-10-CM | POA: Diagnosis not present

## 2023-08-12 MED ORDER — COLCHICINE 0.6 MG PO TABS: ORAL_TABLET | ORAL | 0 refills | Status: DC

## 2023-08-12 NOTE — Telephone Encounter (Signed)
Pt is calling about her BARIATRIC ROLLATOR she's stating that [employer] is asking for prior auth and told her to call us back to request the order and prior authorization for bigger one due to that one she has is too small for her..  Pt also is requesting a med refill.  Prescription Request  08/12/2023  LOV: Visit date not found  What is the name of the medication or equipment? colchicine 0.6 MG tablet [[id number]]   Have you contacted your pharmacy to request a refill? Yes   Which pharmacy would you like this sent to?  [id number] - [place], [place] - [address] AT [facility] [address] [place] [address] Phone: [phone] Fax: [phone]  Patient notified that their request is being sent to the clinical staff for review and that they should receive a response within 2 business days.   Please advise at Mobile [phone] (mobile)

## 2023-08-12 NOTE — Telephone Encounter (Signed)
Script sent and [employer] has been contacted.  See notes below:  Einar Pheasant, CMA; Kathyrn Sheriff; Angus Seller, Clovis Riley Per note on PT account on 07/21/2023 at 11:22am PT refused and stated item was too small.       (1:20 pm)  Einar Pheasant, CMA; Kathyrn Sheriff; Darcel Smalling I see the order was for a [place], I am working with the team to see if anything else is needed to process this. (1:23 pm)

## 2023-08-13 DIAGNOSIS — R1012 Left upper quadrant pain: Secondary | ICD-10-CM | POA: Diagnosis not present

## 2023-08-13 DIAGNOSIS — M170 Bilateral primary osteoarthritis of knee: Secondary | ICD-10-CM | POA: Diagnosis not present

## 2023-08-13 DIAGNOSIS — M549 Dorsalgia, unspecified: Secondary | ICD-10-CM | POA: Diagnosis not present

## 2023-08-15 DIAGNOSIS — F1911 Other psychoactive substance abuse, in remission: Secondary | ICD-10-CM | POA: Diagnosis not present

## 2023-08-15 DIAGNOSIS — F418 Other specified anxiety disorders: Secondary | ICD-10-CM | POA: Diagnosis not present

## 2023-08-15 DIAGNOSIS — F4323 Adjustment disorder with mixed anxiety and depressed mood: Secondary | ICD-10-CM | POA: Diagnosis not present

## 2023-08-15 DIAGNOSIS — F332 Major depressive disorder, recurrent severe without psychotic features: Secondary | ICD-10-CM | POA: Diagnosis not present

## 2023-08-21 ENCOUNTER — Other Ambulatory Visit: Payer: Self-pay | Admitting: Internal Medicine

## 2023-09-04 DIAGNOSIS — H43813 Vitreous degeneration, bilateral: Secondary | ICD-10-CM | POA: Diagnosis not present

## 2023-09-04 DIAGNOSIS — H353231 Exudative age-related macular degeneration, bilateral, with active choroidal neovascularization: Secondary | ICD-10-CM | POA: Diagnosis not present

## 2023-09-04 DIAGNOSIS — H2513 Age-related nuclear cataract, bilateral: Secondary | ICD-10-CM | POA: Diagnosis not present

## 2023-09-04 DIAGNOSIS — H35033 Hypertensive retinopathy, bilateral: Secondary | ICD-10-CM | POA: Diagnosis not present

## 2023-09-10 ENCOUNTER — Other Ambulatory Visit: Payer: Self-pay | Admitting: Internal Medicine

## 2023-09-12 DIAGNOSIS — J449 Chronic obstructive pulmonary disease, unspecified: Secondary | ICD-10-CM | POA: Diagnosis not present

## 2023-09-17 ENCOUNTER — Other Ambulatory Visit: Payer: Self-pay | Admitting: Internal Medicine

## 2023-09-23 ENCOUNTER — Other Ambulatory Visit: Payer: Self-pay | Admitting: Internal Medicine

## 2023-10-01 ENCOUNTER — Other Ambulatory Visit: Payer: Self-pay | Admitting: Internal Medicine

## 2023-10-02 ENCOUNTER — Other Ambulatory Visit: Payer: Self-pay | Admitting: Internal Medicine

## 2023-10-02 DIAGNOSIS — M533 Sacrococcygeal disorders, not elsewhere classified: Secondary | ICD-10-CM | POA: Diagnosis not present

## 2023-10-02 DIAGNOSIS — Z1231 Encounter for screening mammogram for malignant neoplasm of breast: Secondary | ICD-10-CM

## 2023-10-02 DIAGNOSIS — M51379 Other intervertebral disc degeneration, lumbosacral region without mention of lumbar back pain or lower extremity pain: Secondary | ICD-10-CM | POA: Diagnosis not present

## 2023-10-02 DIAGNOSIS — G894 Chronic pain syndrome: Secondary | ICD-10-CM | POA: Diagnosis not present

## 2023-10-02 DIAGNOSIS — G8929 Other chronic pain: Secondary | ICD-10-CM | POA: Diagnosis not present

## 2023-10-02 DIAGNOSIS — M25561 Pain in right knee: Secondary | ICD-10-CM | POA: Diagnosis not present

## 2023-10-02 DIAGNOSIS — E1142 Type 2 diabetes mellitus with diabetic polyneuropathy: Secondary | ICD-10-CM | POA: Diagnosis not present

## 2023-10-02 DIAGNOSIS — M25562 Pain in left knee: Secondary | ICD-10-CM | POA: Diagnosis not present

## 2023-10-02 DIAGNOSIS — M5384 Other specified dorsopathies, thoracic region: Secondary | ICD-10-CM | POA: Diagnosis not present

## 2023-10-02 NOTE — Telephone Encounter (Signed)
 Colchicine  sent to pharmacy.  Can not refill celebrex  - her kidney function has continued to decrease.

## 2023-10-10 ENCOUNTER — Encounter: Payer: Self-pay | Admitting: Gastroenterology

## 2023-10-10 ENCOUNTER — Ambulatory Visit: Payer: Medicare HMO | Admitting: Gastroenterology

## 2023-10-10 ENCOUNTER — Other Ambulatory Visit (INDEPENDENT_AMBULATORY_CARE_PROVIDER_SITE_OTHER): Payer: Medicare HMO

## 2023-10-10 VITALS — BP 114/60 | HR 76 | Ht 67.5 in | Wt 308.2 lb

## 2023-10-10 DIAGNOSIS — R110 Nausea: Secondary | ICD-10-CM | POA: Diagnosis not present

## 2023-10-10 DIAGNOSIS — K7460 Unspecified cirrhosis of liver: Secondary | ICD-10-CM

## 2023-10-10 DIAGNOSIS — R600 Localized edema: Secondary | ICD-10-CM

## 2023-10-10 DIAGNOSIS — R1011 Right upper quadrant pain: Secondary | ICD-10-CM

## 2023-10-10 DIAGNOSIS — F4323 Adjustment disorder with mixed anxiety and depressed mood: Secondary | ICD-10-CM | POA: Diagnosis not present

## 2023-10-10 DIAGNOSIS — F332 Major depressive disorder, recurrent severe without psychotic features: Secondary | ICD-10-CM | POA: Diagnosis not present

## 2023-10-10 DIAGNOSIS — F418 Other specified anxiety disorders: Secondary | ICD-10-CM | POA: Diagnosis not present

## 2023-10-10 DIAGNOSIS — F1911 Other psychoactive substance abuse, in remission: Secondary | ICD-10-CM | POA: Diagnosis not present

## 2023-10-10 LAB — HEPATIC FUNCTION PANEL
ALT: 17 U/L (ref 0–35)
AST: 19 U/L (ref 0–37)
Albumin: 4.1 g/dL (ref 3.5–5.2)
Alkaline Phosphatase: 98 U/L (ref 39–117)
Bilirubin, Direct: 0.2 mg/dL (ref 0.0–0.3)
Total Bilirubin: 0.6 mg/dL (ref 0.2–1.2)
Total Protein: 7.4 g/dL (ref 6.0–8.3)

## 2023-10-10 NOTE — Patient Instructions (Addendum)
  Your provider has requested that you go to the basement level for lab work before leaving today. Press "B" on the elevator. The lab is located at the first door on the left as you exit the elevator.   You have been scheduled for a CT scan of the abdomen and pelvis at [facility], 1st floor Radiology. You are scheduled on          at      . You should arrive 15 minutes prior to your appointment time for registration. Please follow the written instructions below on the day of your exam:   1) Do not eat anything after 4 hours prior to your test    You may take any medications as prescribed with a small amount of water, if necessary. If you take any of the following medications: METFORMIN, GLUCOPHAGE, GLUCOVANCE, AVANDAMET, RIOMET, FORTAMET, ACTOPLUS MET, JANUMET, GLUMETZA or METAGLIP, you MAY be asked to HOLD this medication 48 hours AFTER the exam.   The purpose of you drinking the oral contrast is to aid in the visualization of your intestinal tract. The contrast solution may cause some diarrhea. Depending on your individual set of symptoms, you may also receive an intravenous injection of x-ray contrast/dye. Plan on being at [facility] for 45 minutes or longer, depending on the type of exam you are having performed.   If you have any questions regarding your exam or if you need to reschedule, you may call [facility] at [phone] between the hours of 8:00 am and 5:00 pm, Monday-Friday.    _______________________________________________________  If your blood pressure at your visit was 140/90 or greater, please contact your primary care physician to follow up on this.  _______________________________________________________  If you are age 66 or older, your body mass index should be between 23-30. Your Body mass index is 47.57 kg/m. If this is out of the aforementioned range listed, please consider follow up with your Primary Care Provider.  If you are age 11 or  younger, your body mass index should be between 19-25. Your Body mass index is 47.57 kg/m. If this is out of the aformentioned range listed, please consider follow up with your Primary Care Provider.   ________________________________________________________  The [facility] providers would like to encourage you to use [facility] to communicate with providers for non-urgent requests or questions.  Due to long hold times on the telephone, sending your provider a message by [facility] may be a faster and more efficient way to get a response.  Please allow 48 business hours for a response.  Please remember that this is for non-urgent requests.  _______________________________________________________ It was a pleasure to see you today!  Thank you for trusting me with your gastrointestinal care!

## 2023-10-10 NOTE — Progress Notes (Signed)
[facility] Progress Note  Chief Complaint: NASH cirrhosis  Subjective  Prior history Summary of GI issues: "Mary Acosta was last seen in the office November 2022 for cryptogenic cirrhosis.  She also has chronic upper abdominal discomfort with bloating and nausea in the setting of poorly controlled diabetes.  At a prior office visit she was given a trial of metronidazole as empiric therapy for possible SIBO, and at follow-up she could not recall if it helped. Last screening ultrasound November 2022, last AFP normal at 2.7, also November 2022. Last variceal screening EGD March 2023 with following findings: Portal hypertensive gastropathy. - Normal examined duodenum. - No specimens collected. Normal appearance of LapBand compression in gastric cardia."  From November 2023 office visit: "Mary Acosta follows up today for cirrhosis.  She is struggling with her multiple health issues.  Since I last saw her, she was diagnosed with seronegative rheumatoid arthritis and initially put on methotrexate.  She had messaged [place] months ago asking if we thought that medicine was okay.  Might phone note indicates my concern about the increased risk of that as regards her cirrhosis, and we faxed my most recent office note to her rheumatologist.  [name] tells me she was then taken off the methotrexate but put on prednisone for some time, and that has caused her to gain a significant amount of weight and affect her diabetes.  She feels generally weak much of the time, has difficulty walking and feels off balance. Her digestive issues have remained stable, she has no active GI bleeding, has abdominal bloating as before."  HCC screening was scheduled  She was mailed a Cologuard recall last month but no result yet. _________________________________ Recent office visit with Dr. Elvera Lennox of endocrinology details the ongoing struggles with Mary Acosta's poorly controlled diabetes.  No-show for clinic appointment 06/23/2023  Last  saw endocrinology 06/09/23 - on U500 insulin and Lyumjev _____________________________  [name] had similar complaints to prior visits.  Chronic back pain as well as pain under the right anterior rib cage, nausea, chronic constipation with a BM every few days. Mobility is limited, and she has ongoing difficulties getting better control of her glucose.  She is good about regularly follow-up with primary care and endocrinology. She thought that her primary care provider recently wanted her to have a CT scan of the pancreas to see if there was any visible problem there that might explain her poor diabetic control, but she decided to hold off on that until she saw me today. No black or bloody stool, takes some stool softeners and as needed laxatives. Cologuard had been ordered but was not submitted.  Late to clinic today -has trouble with mobility, some days more than there is  ROS: Cardiovascular:  no chest pain Respiratory: no dyspnea  The patient's Past Medical, Family and Social History were reviewed and are on file in the EMR. Past Medical History:  Diagnosis Date   AKI (acute kidney injury) (HCC) 01/2017   Allergy    seasonal   Anxiety    with panic attacks   Arthritis    "back; feet; hands; shoulders" (08/26/2014)   Asthma    Cataract    forming   Cervical cancer (HCC)    Chronic lower back pain    Chronic narcotic use    Chronic pain syndrome    [facility]   Cirrhosis (HCC)    Clostridium difficile infection 2017   COPD (chronic obstructive pulmonary disease) (HCC)  Daily headache    past hx   Depression    Diabetic neuropathy (HCC) 06/04/2017   feet  and legs , some in hands   DJD (degenerative joint disease)    Fatty liver disease, nonalcoholic    Fibromyalgia    Frequency of urination    HCAP (healthcare-associated pneumonia) 08/26/2014   History of TIA (transient ischemic attack) 11/01/2010   NO RESIDUAL   Hyperlipidemia    Hypertension     Hypothyroidism    IDDM (insulin dependent diabetes mellitus)    Insomnia    Lumbar stenosis    Macular degeneration    Memory difficulty 07/25/2016   Nocturia    OSA (obstructive sleep apnea)    NO CPAP SINCE WT LOSS   Osteoarthritis    with severe disease in knee   Oxygen deficiency    4  liters at bedtime only   Pneumonia "several times"   Polymyalgia rheumatica (HCC)    Scoliosis    Seasonal allergies    Stroke [facility])    TIA   Thyroid cancer (HCC)    Urgency of urination    Vaginal pain S/P SLING  FEB 2012    Past Surgical History:  Procedure Laterality Date   APPENDECTOMY  1982   BREAST EXCISIONAL BIOPSY Left 02/28/2005   Atypical Ductal Hyperplasia   CARDIAC CATHETERIZATION  09/04/2004   NORMAL CORONARY ANATOMY/ NORMAL LVF/ EF 60%   CARDIOVASCULAR STRESS TEST  12-27-2010  DR [place]   ABNORMAL NUCLEAR STUDY W/ /MILD INFERIOR ISCHEMIA/ EF 69%/  CT HEART ANGIOGRAM ;  NO ACUTE FINDINGS   COLONOSCOPY     CRYOABLATION  05/16/2003   w/LEEP FOR ABNORMAL PAP SMEAR   CYSTOSCOPY  05/18/2012   Procedure: CYSTOSCOPY;  Surgeon: Martina Sinner, MD;  Location: [facility];  Service: Urology;  Laterality: N/A;  examination under anethesia   ESOPHAGOGASTRODUODENOSCOPY (EGD) WITH PROPOFOL N/A 09/03/2016   Procedure: ESOPHAGOGASTRODUODENOSCOPY (EGD) WITH PROPOFOL;  Surgeon: Sherrilyn Rist, MD;  Location: [facility];  Service: Gastroenterology;  Laterality: N/A;   ESOPHAGOGASTRODUODENOSCOPY (EGD) WITH PROPOFOL N/A 11/19/2021   Procedure: ESOPHAGOGASTRODUODENOSCOPY (EGD) WITH PROPOFOL;  Surgeon: Sherrilyn Rist, MD;  Location: [facility];  Service: Gastroenterology;  Laterality: N/A;  Varices screeing, cirrhosis   HYSTEROSCOPY WITH D & C  08/19/2007   PMB   KNEE ARTHROSCOPY W/ DEBRIDEMENT Left 03/29/2006   INTERNAL DERANGEMENT/ SEVERE DJD/ MENISCUS TEARS   LAPAROSCOPIC CHOLECYSTECTOMY  06/10/2002   LAPAROSCOPIC GASTRIC BANDING  03/01/2006   TRUNCAL VAGOTOMY/  PLACEMENT OF VG BAND   REVISION TOTAL KNEE ARTHROPLASTY Left 08-29-2008; 05/2009   TONSILLECTOMY  1969   TOTAL KNEE ARTHROPLASTY Left 01/23/2007   SEVERE DJD   TOTAL THYROIDECTOMY  11/22/2005   BILATERAL THYROID NODULES-- PAPILLARY CARCINOMA (0.5CM)/ ADENOMATOID NODULES   TRANSTHORACIC ECHOCARDIOGRAM  12/27/2010   LVSF NORMAL / EF 55-65%/ GRADE I DIASTOLIC DYSFUNCTION/ MILD MITRAL REGURG. / MILDLY DILATED LEFT ATRIUM/ MILDY INCREASED SYSTOLIC PRESSURE OF PULMONARY ARTERIES   TRANSVAGINAL SUBURETERAL TAPE/ SLING  09/28/2010   MIXED URINARY INCONTINENCE   TUBAL LIGATION  1983     Objective:  Med list reviewed  Current Outpatient Medications:    albuterol (VENTOLIN HFA) 108 (90 Base) MCG/ACT inhaler, INHALE TWO (2) PUFFS BY MOUTH EVERY 6 HOURS AS NEEDED FOR SHORTNESS OF BREATH AND WHEEZING, Disp: 18 g, Rfl: 10   Albuterol Sulfate (PROAIR RESPICLICK) 108 (90 Base) MCG/ACT AEPB, 1 puff as needed Inhalation every 4 hrs, Disp: , Rfl:  amLODipine (NORVASC) 5 MG tablet, TAKE 1 TABLET BY MOUTH EVERY MORNING AND EVERY DAY AT BEDTIME, Disp: 60 tablet, Rfl: 10   atomoxetine (STRATTERA) 40 MG capsule, Take 40 mg by mouth daily., Disp: , Rfl:    Buprenorphine HCl 300 MCG FILM, Place 1 Film inside cheek 2 (two) times daily., Disp: , Rfl:    busPIRone (BUSPAR) 15 MG tablet, Take 15 mg by mouth 2 (two) times daily., Disp: , Rfl:    celecoxib (CELEBREX) 100 MG capsule, TAKE 1 CAPSULE BY MOUTH TWICE A DAY, Disp: 60 capsule, Rfl: 1   clonazePAM (KLONOPIN) 0.5 MG tablet, Take 0.5 mg by mouth daily as needed., Disp: , Rfl:    cloNIDine (CATAPRES) 0.1 MG tablet, Take 0.1 mg by mouth every 8 (eight) hours as needed., Disp: , Rfl:    colchicine 0.6 MG tablet, TAKE ONE TABLET BY MOUTH TWICE DAILY FOR 2 DAYS AND THEN 1 TABLET BY MOUTH DAILY, Disp: 10 tablet, Rfl: 0   Continuous Blood Gluc Sensor (FREESTYLE LIBRE 3 SENSOR) MISC, 1 each by Does not apply route every 14 (fourteen) days., Disp: 6 each, Rfl: 3    ergocalciferol (VITAMIN D2) 1.25 MG (50000 UT) capsule, Take 50,000 Units by mouth once a week. Monday, Disp: , Rfl:    fexofenadine (ALLEGRA) 180 MG tablet, Take 180 mg by mouth daily as needed for allergies or rhinitis., Disp: , Rfl:    furosemide (LASIX) 40 MG tablet, TAKE 1 TABLET BY MOUTH EACH MORNING, Disp: 30 tablet, Rfl: 10   gabapentin (NEURONTIN) 300 MG capsule, Take 300 mg by mouth 3 (three) times daily as needed (pain)., Disp: , Rfl:    hydrALAZINE (APRESOLINE) 25 MG tablet, TAKE ONE TABLET BY MOUTH AT BREAKFAST AND AT BEDTIME May take extra tab if needed if bp over 170/80, Disp: 225 tablet, Rfl: 3   hydrOXYzine (VISTARIL) 50 MG capsule, Take 50 mg by mouth 2 (two) times daily as needed for itching or anxiety., Disp: , Rfl:    Insulin Pen Needle (BD PEN NEEDLE NANO U/F) 32G X 4 MM MISC, USE AS DIRECTED FIVE TIMES A DAY, Disp: 100 each, Rfl: 10   insulin regular human CONCENTRATED (HUMULIN R U-500 KWIKPEN) 500 UNIT/ML KwikPen, INJECT UNDER SKIN 30 MIN BEFORE MEALS - TOTAL OF 200 UNITS A DAY AS ADVISED, Disp: 18 mL, Rfl: 1   Insulin Syringe-Needle U-100 26G X 1/2" 1 ML MISC, Use daily for insulin injection as directed, Disp: 100 each, Rfl: 3   Ipratropium-Albuterol (COMBIVENT RESPIMAT) 20-100 MCG/ACT AERS respimat, Inhale 1 puff into the lungs every 6 (six) hours as needed for wheezing., Disp: 4 g, Rfl: 5   ipratropium-albuterol (DUONEB) 0.5-2.5 (3) MG/3ML SOLN, Take 3 mLs by nebulization every 6 (six) hours as needed (Wheezing or dyspnea.)., Disp: 360 mL, Rfl: 11   levothyroxine (SYNTHROID) 175 MCG tablet, TAKE ONE TABLET BY MOUTH BEFORE BREAKFAST, Disp: 90 tablet, Rfl: 3   metFORMIN (GLUCOPHAGE) 500 MG tablet, TAKE 1 TABLET BY MOUTH EACH MORNING AND TAKE 1 TABLET EVERY DAY AT BEDTIME, Disp: 180 tablet, Rfl: 3   metoprolol succinate (TOPROL-XL) 25 MG 24 hr tablet, Take 1 tablet (25 mg total) by mouth at bedtime. Take with or immediately following a meal., Disp: 90 tablet, Rfl: 3   naloxone  (NARCAN) nasal spray 4 mg/0.1 mL, One spray in either nostril once for known/suspected opioid overdose. May repeat every 2-3 minutes in alternating nostril til EMS arrives, Disp: , Rfl:    OXYGEN, Inhale 3 L  into the lungs at bedtime., Disp: , Rfl:    PARoxetine (PAXIL) 20 MG tablet, Take 20 mg by mouth at bedtime., Disp: , Rfl:    PARoxetine (PAXIL) 40 MG tablet, Take 1 tablet (40 mg total) by mouth daily., Disp: 90 tablet, Rfl: 1   potassium chloride SA (KLOR-CON M20) 20 MEQ tablet, Take 2 tab daily, Disp: 180 tablet, Rfl: 3   promethazine (PHENERGAN) 25 MG tablet, TAKE 1/2 TABLET BY MOUTH EVERY 8 HOURS AS NEEDED FOR NAUSEA AND VOMITING, Disp: 30 tablet, Rfl: 2   Propylene Glycol (SYSTANE BALANCE) 0.6 % SOLN, Place 1 drop into both eyes daily as needed (dry eyes)., Disp: , Rfl:    Semaglutide, 1 MG/DOSE, (OZEMPIC, 1 MG/DOSE,) 4 MG/3ML SOPN, Inject 1 mg into the skin once a week., Disp: 9 mL, Rfl: 3   simvastatin (ZOCOR) 20 MG tablet, Take 1 tablet (20 mg total) by mouth daily at 6 PM., Disp: 90 tablet, Rfl: 3   tiZANidine (ZANAFLEX) 4 MG tablet, Take 4 mg by mouth every 8 (eight) hours as needed for muscle spasms., Disp: , Rfl:    Adalimumab (HUMIRA PEN) 40 MG/0.4ML PNKT, Inject 40 mg into the skin every 14 (fourteen) days. Every other Thursday (Patient not taking: Reported on 06/09/2023), Disp: , Rfl:    ARIPiprazole (ABILIFY) 2 MG tablet, Take 2 mg by mouth daily. (Patient not taking: Reported on 10/10/2023), Disp: , Rfl:    folic acid (FOLVITE) 1 MG tablet, Take 1 mg by mouth daily. (Patient not taking: Reported on 10/10/2023), Disp: , Rfl:    Vital signs in last 24 hrs: Vitals:   10/10/23 1054  BP: 114/60  Pulse: 76   Wt Readings from Last 3 Encounters:  10/10/23 (!) 308 lb 4 oz (139.8 kg)  06/09/23 292 lb 9.6 oz (132.7 kg)  02/28/23 (!) 317 lb (143.8 kg)    Physical Exam  Antalgic gait, limits mobility but she can get on exam table with assistance HEENT: sclera anicteric, oral  mucosa moist without lesions Neck: supple, no thyromegaly, JVD or lymphadenopathy Cardiac: Regular, 2+ stable peripheral edema with venous stasis changes Pulm: clear to auscultation bilaterally, normal RR and effort noted Abdomen: soft, + tenderness along right anterior costal margin around toward the flank and mid back, with active bowel sounds.  Left lobe liver probably enlarged, difficult to tell with body habitus Skin; warm and dry, no jaundice or rash   Labs:     Latest Ref Rng & Units 05/27/2023    3:37 PM 02/27/2023   11:59 AM 10/15/2022   12:39 PM  CBC  WBC 4.0 - 10.5 K/uL 7.4  10.4  11.9   Hemoglobin 12.0 - 15.0 g/dL 16.1  09.6  04.5   Hematocrit 36.0 - 46.0 % 40.5  40.8  44.0   Platelets 150.0 - 400.0 K/uL 172.0  137  203       Latest Ref Rng & Units 05/27/2023    3:37 PM 02/27/2023   11:59 AM 02/26/2023    1:17 PM  CMP  Glucose 70 - 99 mg/dL 409  811  914   BUN 6 - 23 mg/dL 18  21  17    Creatinine 0.40 - 1.20 mg/dL 7.82  9.56  2.13   Sodium 135 - 145 mEq/L 127  128  135   Potassium 3.5 - 5.1 mEq/L 5.1  4.5  6.0   Chloride 96 - 112 mEq/L 90  92  92   CO2 19 - 32 mEq/L  26  25  22    Calcium 8.4 - 10.5 mg/dL 9.0  8.5  8.9   Total Protein 6.0 - 8.3 g/dL 7.0     Total Bilirubin 0.2 - 1.2 mg/dL 0.6     Alkaline Phos 39 - 117 U/L 98     AST 0 - 37 U/L 16     ALT 0 - 35 U/L 13      Hgb A1c = 13 in Oct 2024  Last AFP nml at 2.8  in Nov 2023  Last INR 1.1 on 06/27/22  No UGI varices EGD 09/2021 ___________________________________________ Radiologic studies:  Last RUQ [place] Nov 2023  CLINICAL DATA:  Cirrhosis   EXAM: DUPLEX ULTRASOUND OF LIVER   TECHNIQUE: Color and duplex Doppler ultrasound was performed to evaluate the hepatic in-flow and out-flow vessels.   COMPARISON:  CT abdomen/pelvis 02/05/2022   FINDINGS: Liver: Enlarged heterogeneous liver. Mildly increased echogenicity of the hepatic parenchyma. The contour appears nodular. The adjacent renal parenchyma  is hypoechoic in comparison. No focal lesion, mass or intrahepatic biliary ductal dilatation.   Main Portal Vein size: 1.4 cm   Portal Vein Velocities   Main Prox:  24 cm/sec with normal hepatopetal directional flow.   Main Mid: 23 cm/sec with normal hepatopetal directional flow.   Main Dist:  31 cm/sec with normal hepatopetal directional flow. Right: 20 cm/sec with normal hepatopetal directional flow. Left: 7 cm/sec with normal hepatopetal directional flow.   Hepatic Vein Velocities   Unable to visualize the hepatic veins due to high-riding liver and poor penetration.   IVC: Present and patent with normal respiratory phasicity.   Hepatic Artery Velocity:  28 cm/sec   Splenic Vein Velocity:  23 cm/sec   Spleen: 15 cm x 5.9 cm x 5.0 cm with a total volume of 285 cm^3 (411 cm^3 is upper limit normal)   Portal Vein Occlusion/Thrombus: No   Splenic Vein Occlusion/Thrombus: No   Ascites: None   Varices: None   IMPRESSION: 1. Hepatic steatosis with probable cirrhosis. 2. The portal veins are patent with normal hepatopetal directional flow. Velocities are low but this may be a spurious finding. 3. Limited examination secondary to patient body habitus. 4. Unable to visualize hepatic veins due to patient body habitus and high-riding liver.   Signed,   Sterling Big, MD, RPVI   [facility]   [facility]     Electronically Signed   By: Malachy Moan M.D.   On: 07/10/2022 14:49    _____________________________   Last CTAP Oct 2020  ____________________________________________ Other:   _____________________________________________   Encounter Diagnoses  Name Primary?   Cirrhosis of liver without ascites, unspecified hepatic cirrhosis type (HCC) Yes   Nausea without vomiting    Peripheral edema    RUQ pain     While she has cirrhosis that has been stable for at least the last couple of years, she  has a multitude of other ongoing symptoms that I believe are largely related to her poorly controlled diabetes.   I would prefer not to use regular dosing of antiemetics, concern for potential drug interactions and side effects with her polypharmacy. I agree imaging her pancreas is reasonable for the controlled diabetes, and she is due for hepatocellular carcinoma screening. Up-to-date on variceal screening  Quite frankly, with her overall condition and poorly controlled diabetes, a colonoscopy (either for primary screening or if she would have a positive Cologuard) does not seem logistically feasible and would be at  high risk for complications so I would forego that at this point.  Plan: Labs today: BUN and creatinine (before), hepatic function panel, INR, AFP  CT scan abdomen with oral and IV contrast, screen for liver cancer and evaluate pancreas for mass or evidence of chronic pancreatitis in the setting of poorly controlled diabetes.  32 minutes were spent on this encounter (including chart review, history/exam, counseling/coordination of care, and documentation) > 50% of that time was spent on counseling and coordination of care.   Charlie Pitter III

## 2023-10-11 LAB — BUN+CREAT
BUN/Creatinine Ratio: 13 (ref 12–28)
BUN: 18 mg/dL (ref 8–27)
Creatinine, Ser: 1.34 mg/dL — ABNORMAL HIGH (ref 0.57–1.00)
eGFR: 43 mL/min/{1.73_m2} — ABNORMAL LOW (ref >59)

## 2023-10-13 ENCOUNTER — Encounter: Payer: Self-pay | Admitting: Gastroenterology

## 2023-10-13 DIAGNOSIS — J449 Chronic obstructive pulmonary disease, unspecified: Secondary | ICD-10-CM | POA: Diagnosis not present

## 2023-10-13 LAB — AFP TUMOR MARKER: AFP-Tumor Marker: 2.9 ng/mL

## 2023-10-14 ENCOUNTER — Ambulatory Visit
Admission: RE | Admit: 2023-10-14 | Discharge: 2023-10-14 | Disposition: A | Discharge status: Home / Self Care | Payer: Medicare HMO | Source: Ambulatory Visit | Attending: Internal Medicine | Admitting: Internal Medicine

## 2023-10-14 DIAGNOSIS — Z1231 Encounter for screening mammogram for malignant neoplasm of breast: Secondary | ICD-10-CM

## 2023-10-15 ENCOUNTER — Other Ambulatory Visit: Payer: Self-pay | Admitting: Internal Medicine

## 2023-10-17 DIAGNOSIS — H353231 Exudative age-related macular degeneration, bilateral, with active choroidal neovascularization: Secondary | ICD-10-CM | POA: Diagnosis not present

## 2023-10-17 DIAGNOSIS — H04123 Dry eye syndrome of bilateral lacrimal glands: Secondary | ICD-10-CM | POA: Diagnosis not present

## 2023-10-17 DIAGNOSIS — H524 Presbyopia: Secondary | ICD-10-CM | POA: Diagnosis not present

## 2023-10-17 DIAGNOSIS — H2513 Age-related nuclear cataract, bilateral: Secondary | ICD-10-CM | POA: Diagnosis not present

## 2023-10-22 ENCOUNTER — Encounter (HOSPITAL_COMMUNITY): Payer: Self-pay

## 2023-10-22 ENCOUNTER — Ambulatory Visit (HOSPITAL_COMMUNITY)
Admission: RE | Admit: 2023-10-22 | Discharge: 2023-10-22 | Disposition: A | Discharge status: Home / Self Care | Payer: Medicare HMO | Source: Ambulatory Visit | Attending: Gastroenterology | Admitting: Gastroenterology

## 2023-10-22 ENCOUNTER — Other Ambulatory Visit: Payer: Self-pay | Admitting: Student

## 2023-10-22 DIAGNOSIS — R110 Nausea: Secondary | ICD-10-CM | POA: Insufficient documentation

## 2023-10-22 DIAGNOSIS — K7460 Unspecified cirrhosis of liver: Secondary | ICD-10-CM | POA: Diagnosis not present

## 2023-10-22 DIAGNOSIS — R600 Localized edema: Secondary | ICD-10-CM | POA: Diagnosis not present

## 2023-10-22 DIAGNOSIS — R1011 Right upper quadrant pain: Secondary | ICD-10-CM | POA: Insufficient documentation

## 2023-10-22 LAB — POCT I-STAT CREATININE: Creatinine, Ser: 1.3 mg/dL — ABNORMAL HIGH (ref 0.44–1.00)

## 2023-10-22 MED ORDER — IOHEXOL 300 MG/ML  SOLN
100.0000 mL | Freq: Once | INTRAMUSCULAR | Status: AC | PRN
  Administered 2023-10-22: 100 mL via INTRAVENOUS

## 2023-10-22 MED ORDER — SODIUM CHLORIDE (PF) 0.9 % IJ SOLN
INTRAMUSCULAR | Status: AC
Start: 2023-10-22 — End: ?
  Filled 2023-10-22: qty 50

## 2023-11-05 ENCOUNTER — Other Ambulatory Visit: Payer: Self-pay | Admitting: Internal Medicine

## 2023-11-05 DIAGNOSIS — E1165 Type 2 diabetes mellitus with hyperglycemia: Secondary | ICD-10-CM

## 2023-11-06 ENCOUNTER — Ambulatory Visit: Payer: Medicare HMO

## 2023-11-07 ENCOUNTER — Encounter: Payer: Self-pay | Admitting: Internal Medicine

## 2023-11-07 ENCOUNTER — Other Ambulatory Visit: Payer: Self-pay | Admitting: Internal Medicine

## 2023-11-07 DIAGNOSIS — F332 Major depressive disorder, recurrent severe without psychotic features: Secondary | ICD-10-CM | POA: Diagnosis not present

## 2023-11-07 DIAGNOSIS — F1911 Other psychoactive substance abuse, in remission: Secondary | ICD-10-CM | POA: Diagnosis not present

## 2023-11-07 DIAGNOSIS — F4323 Adjustment disorder with mixed anxiety and depressed mood: Secondary | ICD-10-CM | POA: Diagnosis not present

## 2023-11-07 DIAGNOSIS — F418 Other specified anxiety disorders: Secondary | ICD-10-CM | POA: Diagnosis not present

## 2023-11-10 ENCOUNTER — Encounter: Payer: Self-pay | Admitting: Gastroenterology

## 2023-11-10 DIAGNOSIS — J449 Chronic obstructive pulmonary disease, unspecified: Secondary | ICD-10-CM | POA: Diagnosis not present

## 2023-11-18 NOTE — Progress Notes (Unsigned)
 Patient ID: Mary Acosta, female   DOB: Jul 12, 1956, 68 y.o.   MRN: [id number]   HPI: Mary Acosta is a 68 y.o.-year-old female, initially referred by her PCP, Dr. Lawerance Bach, returning for follow-up for DM2, dx in 2000, insulin-dependent since 2011, uncontrolled, with long-term complications (peripheral neuropathy, gastroparesis, cerebrovascular disease with history of TIA 2012, DKA 2015) and also history of thyroid cancer and postsurgical hypothyroidism.  She previously saw my colleague, Dr. Everardo All, up to 07/2019.  Last visit with me 5 months ago (she did not return after 1.5 months, as advised).   She is here with her son-in-law.  Interim hx: + increased urination, nausea.  No blurry vision, chest pain.  She lost approximately 10 pounds net since last visit. Before last visit, she had very high blood sugars in the 500s and 600s and a high lipase that she was taken off Ozempic.  That time, she was on prednisone for joint pains due to AKI on Celebrex.   She recently came off U-500 insulin due to low blood sugars.  DM2: Reviewed HbA1c levels: Lab Results  Component Value Date   HGBA1C 13.0 (H) 05/27/2023   HGBA1C 12.1 (A) 12/24/2022   HGBA1C 10.6 (A) 08/08/2022   HGBA1C 11.9 (A) 05/10/2022   HGBA1C 8.1 (A) 10/09/2021   HGBA1C 8.6 (A) 06/04/2021   HGBA1C 6.9 (A) 01/15/2021   HGBA1C 7.6 (A) 10/13/2020   HGBA1C 8.1 (A) 07/05/2020   HGBA1C 10.4 (H) 05/03/2020   She was previously on: - Basaglar 80 units at bedtime -[place] >> stopped >> once a week >> 50 >> 80 >> 160 units daily >> 80 units twice a day  - Metformin 1000 mg with dinner >> 500 mg 2x a day - Ozempic 0.5 >> 1 mg weekly - - Lyumjev 10-20 >> 20-25 units before meals >> muscle cramps >> decreased: 14 units Prev. On Rybelsus 3 mg before breakfast-see patient assistance pgm  She was on N and R insulin in the past. She was on metformin in the past >> did not work. She was prev. On Toujeo 180 units  daily.  Currently on: - Metformin 500 mg 2x a day - Ozempic 1 mg weekly in a.m. >> stopped 2 weeks ago 2/2 high lipase - U500 - 30 min before the meal: - 30 >> she is actually taking 120-170 units before cappuccino! >> 80-100 units before cappuccino - 40-60 >> 40-50 >> 50-60 units before brunch - 60-80 >> 0 >> 60-80 units before dinner You may need to take 10-20 units of Lyumjev insulin only for correction.  Pt checks her sugars 4x  times a day, prev. w daily/ CGM (with receiver) - not affordable: - am: 400-550 - 2h after b'fast: n/c - lunch: 250-400 - 2h after lunch: n/c - dinner: 350 - 2h after dinner: 450-500 - bedtime: n/c  Previously:   Lowest sugar was 226 >> 250; she has hypoglycemia awareness in the 70s. Highest sugar was 500s >> 561 >> Hi.  Glucometer: AccuChek, Prodigy  Pt's meals are: - Breakfast: bacon or sausage and eggs, cereal >> grits, or grilled cheese - Lunch: salad; hot dog, sandwiches >> ham and cheese sandwich or skips - Dinner: meat + veggies  >> larger - Snacks: not so much anymore Previously drinking Kool-Aid, now stopped.  However, she is still having that she is drinking High C juice.   She is status post gastric banding with vagotomy in 2008.  She lost 148 pounds afterwards  but then gained more than 100 pounds back.  No h/o pancreatitis or FH of MTC.  However, she had a high lipase in 05/2023.  Now off Ozempic.  No CKD, last BUN/creatinine:  Lab Results  Component Value Date   BUN 18 10/10/2023   BUN 18 05/27/2023   CREATININE 1.30 (H) 10/22/2023   CREATININE 1.34 (H) 10/10/2023   Lab Results  Component Value Date   MICRALBCREAT 1.5 05/27/2023   MICRALBCREAT 0.7 09/19/2017   MICRALBCREAT 0.4 02/05/2017   MICRALBCREAT 0.6 02/01/2016   MICRALBCREAT 1.2 12/07/2015  Not on ACE inhibitor/ARB.  + HL; last set of lipids: Lab Results  Component Value Date   CHOL 163 05/27/2023   HDL 32.30 (L) 05/27/2023   LDLCALC 88 05/27/2023   TRIG  215.0 (H) 05/27/2023   CHOLHDL 5 05/27/2023  On Zocor 20, Zetia 10.  - last eye exam was in 2023 reportedly: no DR. Stopped IO injections for macular degeneration.  -She has pain, numbness, tingling in her feet.  Last foot exam 12/24/2022.  On ASA 81.  Pt has FH of DM in mother.  Thyroid cancer:  Reviewed her thyroid cancer history: She is status post total thyroidectomy - [place] Sx 2007. 11/22/2005: THYROID, THYROIDECTOMY:   - PAPILLARY THYROID CARCINOMA (0.5 CM).   - ADENOMATOID NODULES.    COMMENT   ONCOLOGY TABLE-THYROID    1. Maximum tumor size (cm): 0.5 cm   2. Tumor location: Right mid lobe   3. Multifocality: Not identified   4. Histology: Papillary thyroid carcinoma   5. Margins: Negative   6. Capsular invasion: Present   7. Extrathyroidal extension: Not identified   8. Vascular/Lymphatic invasion: Not identified   9. Lymph nodes: N/A   10. TNM code: pT1, pNX, pMX   11. Comments: Examination of the thyroidectomy specimen   grossly demonstrates multiple nodules. Histologic evaluation   demonstrates numerous adenomatoid nodules. One nodule measuring   0.5 cm demonstrates features compatible with papillary thyroid   carcinoma. The surgical margins are negative. (MS:jy) 11/25/05   Neck U/S (10/25/2020): no abnormal neck masses  Postsurgical hypothyroidism:  She takes 175 mcg levothyroxine daily (dose increased 11/2022): - no missed doses anymore - in am - fasting - at least 30 min from b'fast - no calcium - no iron - no multivitamins - no PPIs - not on Biotin  Reviewed her TFTs: Lab Results  Component Value Date   TSH 3.01 05/27/2023   TSH 10.66 (H) 12/24/2022   TSH 4.62 05/10/2022   TSH 0.13 (L) 10/09/2021   TSH 0.51 02/02/2021   TSH 0.14 (L) 08/04/2020   TSH 4.42 05/03/2020   TSH 1.50 10/22/2019   TSH 1.09 07/20/2019   TSH 1.32 04/01/2019   TSH 0.55 09/04/2018   TSH 0.90 12/26/2017   TSH 0.113 (L) 12/08/2017   TSH 0.22 (L) 09/19/2017    TSH 0.62 06/13/2017   Pt denies: - feeling nodules in neck - hoarseness - dysphagia - choking  She also has a history of COPD, OSA, fibromyalgia, depression, fatty liver-cirrhosis, DDD, h/o vb fx, PMR. She is seen in pain clinic in [facility].  She is on Neurontin and amitriptyline. She lost 2 sons: 2020 and 09/2020.    ROS: + see HPI  I reviewed pt's medications, allergies, PMH, social hx, family hx, and changes were documented in the history of present illness. Otherwise, unchanged from my initial visit note.  Past Medical History:  Diagnosis Date   AKI (acute kidney  injury) (HCC) 01/2017   Allergy    seasonal   Anxiety    with panic attacks   Arthritis    "back; feet; hands; shoulders" (08/26/2014)   Asthma    Cataract    forming   Cervical cancer (HCC)    Chronic lower back pain    Chronic narcotic use    Chronic pain syndrome    [facility]   Cirrhosis (HCC)    Clostridium difficile infection 2017   COPD (chronic obstructive pulmonary disease) (HCC)    Daily headache    past hx   Depression    Diabetic neuropathy (HCC) 06/04/2017   feet  and legs , some in hands   DJD (degenerative joint disease)    Fatty liver disease, nonalcoholic    Fibromyalgia    Frequency of urination    HCAP (healthcare-associated pneumonia) 08/26/2014   History of TIA (transient ischemic attack) 11/01/2010   NO RESIDUAL   Hyperlipidemia    Hypertension    Hypothyroidism    IDDM (insulin dependent diabetes mellitus)    Insomnia    Lumbar stenosis    Macular degeneration    Memory difficulty 07/25/2016   Nocturia    OSA (obstructive sleep apnea)    NO CPAP SINCE WT LOSS   Osteoarthritis    with severe disease in knee   Oxygen deficiency    4  liters at bedtime only   Pneumonia "several times"   Polymyalgia rheumatica (HCC)    Scoliosis    Seasonal allergies    Stroke [facility])    TIA   Thyroid cancer (HCC)    Urgency of urination    Vaginal pain S/P SLING   FEB 2012   Past Surgical History:  Procedure Laterality Date   APPENDECTOMY  1982   BREAST EXCISIONAL BIOPSY Left 02/28/2005   Atypical Ductal Hyperplasia   CARDIAC CATHETERIZATION  09/04/2004   NORMAL CORONARY ANATOMY/ NORMAL LVF/ EF 60%   CARDIOVASCULAR STRESS TEST  12-27-2010  DR [place]   ABNORMAL NUCLEAR STUDY W/ /MILD INFERIOR ISCHEMIA/ EF 69%/  CT HEART ANGIOGRAM ;  NO ACUTE FINDINGS   COLONOSCOPY     CRYOABLATION  05/16/2003   w/LEEP FOR ABNORMAL PAP SMEAR   CYSTOSCOPY  05/18/2012   Procedure: CYSTOSCOPY;  Surgeon: Martina Sinner, MD;  Location[facility];  Service: Urology;  Laterality: N/A;  examination under anethesia   ESOPHAGOGASTRODUODENOSCOPY (EGD) WITH PROPOFOL N/A 09/03/2016   Procedure: ESOPHAGOGASTRODUODENOSCOPY (EGD) WITH PROPOFOL;  Surgeon: Sherrilyn Rist, MD;  Location: [facility];  Service: Gastroenterology;  Laterality: N/A;   ESOPHAGOGASTRODUODENOSCOPY (EGD) WITH PROPOFOL N/A 11/19/2021   Procedure: ESOPHAGOGASTRODUODENOSCOPY (EGD) WITH PROPOFOL;  Surgeon: Sherrilyn Rist, MD;  Location: [facility];  Service: Gastroenterology;  Laterality: N/A;  Varices screeing, cirrhosis   HYSTEROSCOPY WITH D & C  08/19/2007   PMB   KNEE ARTHROSCOPY W/ DEBRIDEMENT Left 03/29/2006   INTERNAL DERANGEMENT/ SEVERE DJD/ MENISCUS TEARS   LAPAROSCOPIC CHOLECYSTECTOMY  06/10/2002   LAPAROSCOPIC GASTRIC BANDING  03/01/2006   TRUNCAL VAGOTOMY/ PLACEMENT OF VG BAND   REVISION TOTAL KNEE ARTHROPLASTY Left 08-29-2008; 05/2009   TONSILLECTOMY  1969   TOTAL KNEE ARTHROPLASTY Left 01/23/2007   SEVERE DJD   TOTAL THYROIDECTOMY  11/22/2005   BILATERAL THYROID NODULES-- PAPILLARY CARCINOMA (0.5CM)/ ADENOMATOID NODULES   TRANSTHORACIC ECHOCARDIOGRAM  12/27/2010   LVSF NORMAL / EF 55-65%/ GRADE I DIASTOLIC DYSFUNCTION/ MILD MITRAL REGURG. / MILDLY DILATED LEFT ATRIUM/ MILDY INCREASED  SYSTOLIC PRESSURE OF PULMONARY ARTERIES   TRANSVAGINAL SUBURETERAL TAPE/ SLING   09/28/2010   MIXED URINARY INCONTINENCE   TUBAL LIGATION  1983   Social History   Socioeconomic History   Marital status: Married    Spouse name: Not on file   Number of children: 2   Years of education: 12   Highest education level: Not on file  Occupational History   Occupation: disabled    Employer: UNEMPLOYED  Tobacco Use   Smoking status: Former    Current packs/day: 0.00    Average packs/day: 2.5 packs/day for 39.0 years (97.5 ttl pk-yrs)    Types: Cigarettes    Start date: 10/23/1971    Quit date: 10/22/2010    Years since quitting: 13.0   Smokeless tobacco: Never  Vaping Use   Vaping status: Never Used  Substance and Sexual Activity   Alcohol use: No    Alcohol/week: 0.0 standard drinks of alcohol   Drug use: No   Sexual activity: Not Currently  Other Topics Concern   Not on file  Social History Narrative   Lives at home w/ her husband    Right-handed   Caffeine: 1 cup of coffee per week + Pepsi   Social Drivers of Health   Financial Resource Strain: Medium Risk (06/06/2022)   Overall Financial Resource Strain (CARDIA)    Difficulty of Paying Living Expenses: Somewhat hard  Food Insecurity: No Food Insecurity (06/06/2022)   Hunger Vital Sign    Worried About Running Out of Food in the Last Year: Never true    Ran Out of Food in the Last Year: Never true  Transportation Needs: No Transportation Needs (06/06/2022)   PRAPARE - Administrator, Civil Service (Medical): No    Lack of Transportation (Non-Medical): No  Physical Activity: Inactive (06/06/2022)   Exercise Vital Sign    Days of Exercise per Week: 0 days    Minutes of Exercise per Session: 0 min  Stress: No Stress Concern Present (06/06/2022)   [employer] of Occupational Health - Occupational Stress Questionnaire    Feeling of Stress : Only a little  Social Connections: Socially Integrated (06/06/2022)   Social Connection and Isolation Panel [NHANES]    Frequency of  Communication with Friends and Family: More than three times a week    Frequency of Social Gatherings with Friends and Family: More than three times a week    Attends Religious Services: More than 4 times per year    Active Member of [employer] or Organizations: Yes    Attends Engineer, structural: More than 4 times per year    Marital Status: Married  Catering manager Violence: Not At Risk (06/06/2022)   Humiliation, Afraid, Rape, and Kick questionnaire    Fear of Current or Ex-Partner: No    Emotionally Abused: No    Physically Abused: No    Sexually Abused: No   Current Outpatient Medications on File Prior to Visit  Medication Sig Dispense Refill   Adalimumab (HUMIRA PEN) 40 MG/0.4ML PNKT Inject 40 mg into the skin every 14 (fourteen) days. Every other Thursday (Patient not taking: Reported on 06/09/2023)     albuterol (VENTOLIN HFA) 108 (90 Base) MCG/ACT inhaler INHALE TWO (2) PUFFS BY MOUTH EVERY 6 HOURS AS NEEDED FOR SHORTNESS OF BREATH AND WHEEZING 18 g 10   Albuterol Sulfate (PROAIR RESPICLICK) 108 (90 Base) MCG/ACT AEPB 1 puff as needed Inhalation every 4 hrs     amLODipine (NORVASC) 5 MG  tablet TAKE 1 TABLET BY MOUTH EVERY MORNING AND EVERY DAY AT BEDTIME 60 tablet 10   ARIPiprazole (ABILIFY) 2 MG tablet Take 2 mg by mouth daily. (Patient not taking: Reported on 10/10/2023)     atomoxetine (STRATTERA) 40 MG capsule Take 40 mg by mouth daily.     Buprenorphine HCl 300 MCG FILM Place 1 Film inside cheek 2 (two) times daily.     busPIRone (BUSPAR) 15 MG tablet Take 15 mg by mouth 2 (two) times daily.     celecoxib (CELEBREX) 100 MG capsule TAKE 1 CAPSULE BY MOUTH TWICE A DAY 60 capsule 0   clonazePAM (KLONOPIN) 0.5 MG tablet Take 0.5 mg by mouth daily as needed.     cloNIDine (CATAPRES) 0.1 MG tablet Take 0.1 mg by mouth every 8 (eight) hours as needed.     colchicine 0.6 MG tablet TAKE ONE TABLET BY MOUTH TWICE DAILY FOR 2 DAYS AND THEN 1 TABLET BY MOUTH DAILY 10 tablet 0    Continuous Blood Gluc Sensor (FREESTYLE LIBRE 3 SENSOR) MISC 1 each by Does not apply route every 14 (fourteen) days. 6 each 3   ergocalciferol (VITAMIN D2) 1.25 MG (50000 UT) capsule Take 50,000 Units by mouth once a week. Monday     fexofenadine (ALLEGRA) 180 MG tablet Take 180 mg by mouth daily as needed for allergies or rhinitis.     folic acid (FOLVITE) 1 MG tablet Take 1 mg by mouth daily. (Patient not taking: Reported on 10/10/2023)     furosemide (LASIX) 40 MG tablet TAKE 1 TABLET BY MOUTH EACH MORNING 30 tablet 10   gabapentin (NEURONTIN) 300 MG capsule Take 300 mg by mouth 3 (three) times daily as needed (pain).     hydrALAZINE (APRESOLINE) 25 MG tablet TAKE ONE TABLET BY MOUTH AT BREAKFAST AND AT BEDTIME May take extra tab if needed if bp over 170/80 225 tablet 3   hydrOXYzine (VISTARIL) 50 MG capsule Take 50 mg by mouth 2 (two) times daily as needed for itching or anxiety.     Insulin Pen Needle (BD PEN NEEDLE NANO U/F) 32G X 4 MM MISC USE AS DIRECTED FIVE TIMES A DAY 100 each 10   insulin regular human CONCENTRATED (HUMULIN R U-500 KWIKPEN) 500 UNIT/ML KwikPen INJECT UNDER SKIN 30 MIN BEFORE MEALS - TOTAL OF 200 UNITS A DAY AS ADVISED 18 mL 1   Insulin Syringe-Needle U-100 26G X 1/2" 1 ML MISC Use daily for insulin injection as directed 100 each 3   Ipratropium-Albuterol (COMBIVENT RESPIMAT) 20-100 MCG/ACT AERS respimat Inhale 1 puff into the lungs every 6 (six) hours as needed for wheezing. 4 g 5   ipratropium-albuterol (DUONEB) 0.5-2.5 (3) MG/3ML SOLN Take 3 mLs by nebulization every 6 (six) hours as needed (Wheezing or dyspnea.). 360 mL 11   levothyroxine (SYNTHROID) 175 MCG tablet TAKE ONE TABLET BY MOUTH BEFORE BREAKFAST 90 tablet 3   metFORMIN (GLUCOPHAGE) 500 MG tablet TAKE 1 TABLET BY MOUTH EACH MORNING AND TAKE 1 TABLET EVERY DAY AT BEDTIME 180 tablet 3   metoprolol succinate (TOPROL-XL) 25 MG 24 hr tablet TAKE 1 TABLET BY MOUTH EVERY DAY AT BEDTIME WITH OR IMMEDIATLY FOLLOWING A  MEAL 30 tablet 4   naloxone (NARCAN) nasal spray 4 mg/0.1 mL One spray in either nostril once for known/suspected opioid overdose. May repeat every 2-3 minutes in alternating nostril til EMS arrives     OXYGEN Inhale 3 L into the lungs at bedtime.     PARoxetine (PAXIL)  20 MG tablet Take 20 mg by mouth at bedtime.     PARoxetine (PAXIL) 40 MG tablet Take 1 tablet (40 mg total) by mouth daily. 90 tablet 1   potassium chloride SA (KLOR-CON M20) 20 MEQ tablet Take 2 tab daily 180 tablet 3   promethazine (PHENERGAN) 25 MG tablet TAKE 1/2 TABLET BY MOUTH EVERY 8 HOURS AS NEEDED FOR NAUSEA AND VOMITING 30 tablet 2   Propylene Glycol (SYSTANE BALANCE) 0.6 % SOLN Place 1 drop into both eyes daily as needed (dry eyes).     Semaglutide, 1 MG/DOSE, (OZEMPIC, 1 MG/DOSE,) 4 MG/3ML SOPN Inject 1 mg into the skin once a week. 9 mL 3   simvastatin (ZOCOR) 20 MG tablet Take 1 tablet (20 mg total) by mouth daily at 6 PM. 90 tablet 3   tiZANidine (ZANAFLEX) 4 MG tablet Take 4 mg by mouth every 8 (eight) hours as needed for muscle spasms.     No current facility-administered medications on file prior to visit.   Allergies  Allergen Reactions   Gabapentin Swelling    Swelling in legs    Losartan Other (See Comments)    Myalgias and muscle cramping    Aricept [Donepezil Hcl]     Nausea/vomiting, low BP   Aripiprazole Other (See Comments)    Patient reports Hypoglycemia   Hydroxyzine     hallucinations   Oxycodone-Acetaminophen Itching   Oxycodone Itching   Sulfa Antibiotics Nausea Only and Rash   Sulfonamide Derivatives Nausea Only and Rash   Family History  Problem Relation Age of Onset   Colon polyps Mother    Diabetes Mother    Heart disease Mother    Dementia Mother    Heart disease Father    High blood pressure Father    Colon cancer Maternal Uncle        x 3   Stomach cancer Paternal Uncle    Breast cancer Other        great aunts x 5   Esophageal cancer Neg Hx    Rectal cancer Neg Hx     PE: There were no vitals taken for this visit. Wt Readings from Last 10 Encounters:  10/10/23 (!) 308 lb 4 oz (139.8 kg)  06/09/23 292 lb 9.6 oz (132.7 kg)  02/28/23 (!) 317 lb (143.8 kg)  02/27/23 (!) 320 lb (145.2 kg)  02/26/23 (!) 322 lb 9.6 oz (146.3 kg)  12/24/22 (!) 302 lb 6.4 oz (137.2 kg)  10/15/22 (!) 303 lb 9.6 oz (137.7 kg)  08/08/22 (!) 310 lb (140.6 kg)  07/24/22 (!) 307 lb (139.3 kg)  07/12/22 (!) 314 lb (142.4 kg)   Constitutional: overweight, in NAD Eyes:  EOMI, no exophthalmos ENT: no neck masses, no cervical lymphadenopathy Cardiovascular: RRR, No MRG Respiratory: CTA B Musculoskeletal: no deformities Skin:no rashes Neurological: no tremor with outstretched hands  ASSESSMENT: 1. DM2, insulin-dependent, uncontrolled, with complications - cerebrovascular disease with history of TIA 2012 - peripheral neuropathy - gastroparesis - DKA 2015  2.  History of thyroid cancer  3.  Postsurgical hypothyroidism  PLAN:  1. Patient with longstanding, uncontrolled, type 2 diabetes, on weekly GLP-1 receptor agonist previously but off now after lipase returned elevated at 131 2 weeks prior to our last appointment.  Unfortunately, after coming off Ozempic, her sugars increased dramatically, to 500s and 600s especially as at last visit she was also on prednisone for joint aches as she had to stop Celebrex due to AKI. -At last visit we discussed  about increasing the doses of U-500 insulin with lunch and dinner and backing up the morning dose.  I also advised her to use Lyumjev insulin for correction.  In an effort to improve her insulin resistance, I advised her to try to do a water fast 1 day a week and during this, not to take insulin or metformin. -Since last visit, she sent me a message that she actually switched to Basaglar as U-500 insulin was dropping her blood sugars too low   - I suggested to:  Patient Instructions  Please continue: - Metformin 500 mg 2x a day -  U500 - 30 min before the meal: - 80-100 units before cappuccino - 50-60 units before brunch - 60-80 units before dinner You may need to take 10-20 units of Lyumjev insulin only for correction.  Try to fast (only with water) 1 day a week. In this day, do not take insulin or Metformin.  Please continue Levothyroxine 175 mcg daily.   Take the thyroid hormone every day, with water, at least 30 minutes before breakfast, separated by at least 4 hours from: - acid reflux medications - calcium - iron - multivitamins   Please return in 1.5 months.       - we checked her HbA1c: 7%  - advised to check sugars at different times of the day - 4x a day, rotating check times - advised for yearly eye exams >> she is UTD - return to clinic in 1.5 months  2.  History of thyroid cancer -She had a papillary thyroid cancer focus in the right thyroid lobe, measuring 0.5 cm.  No vascular, lymphatic or direct extension into the surrounding tissues -No neck compression symptoms -The neck ultrasound from 03/22 showed no suspicious masses -She is most likely cured  3.  Postsurgical hypothyroidism - latest thyroid labs reviewed with pt. >> normal: Lab Results  Component Value Date   TSH 3.01 05/27/2023  - she continues on LT4 175 mcg daily - pt feels good on this dose. - we discussed about taking the thyroid hormone every day, with water, >30 minutes before breakfast, separated by >4 hours from acid reflux medications, calcium, iron, multivitamins. Pt. is taking it correctly.  Carlus Pavlov, MD PhD [facility] Endocrinology

## 2023-11-19 ENCOUNTER — Ambulatory Visit (INDEPENDENT_AMBULATORY_CARE_PROVIDER_SITE_OTHER): Admitting: Internal Medicine

## 2023-11-19 ENCOUNTER — Other Ambulatory Visit: Payer: Self-pay | Admitting: Internal Medicine

## 2023-11-19 ENCOUNTER — Encounter: Payer: Self-pay | Admitting: Internal Medicine

## 2023-11-19 VITALS — BP 130/70 | HR 79 | Ht 67.5 in | Wt 316.2 lb

## 2023-11-19 DIAGNOSIS — E1165 Type 2 diabetes mellitus with hyperglycemia: Secondary | ICD-10-CM | POA: Diagnosis not present

## 2023-11-19 DIAGNOSIS — E1159 Type 2 diabetes mellitus with other circulatory complications: Secondary | ICD-10-CM | POA: Diagnosis not present

## 2023-11-19 DIAGNOSIS — E890 Postprocedural hypothyroidism: Secondary | ICD-10-CM | POA: Diagnosis not present

## 2023-11-19 DIAGNOSIS — Z7984 Long term (current) use of oral hypoglycemic drugs: Secondary | ICD-10-CM | POA: Diagnosis not present

## 2023-11-19 DIAGNOSIS — Z794 Long term (current) use of insulin: Secondary | ICD-10-CM | POA: Diagnosis not present

## 2023-11-19 DIAGNOSIS — E782 Mixed hyperlipidemia: Secondary | ICD-10-CM

## 2023-11-19 DIAGNOSIS — Z7985 Long-term (current) use of injectable non-insulin antidiabetic drugs: Secondary | ICD-10-CM | POA: Diagnosis not present

## 2023-11-19 DIAGNOSIS — Z85850 Personal history of malignant neoplasm of thyroid: Secondary | ICD-10-CM

## 2023-11-19 LAB — POCT GLYCOSYLATED HEMOGLOBIN (HGB A1C): Hemoglobin A1C: 11.5 % — AB (ref 4.0–5.6)

## 2023-11-19 MED ORDER — DEXCOM G7 RECEIVER DEVI: 1.0000 | Freq: Once | 0 refills | Status: AC

## 2023-11-19 MED ORDER — DEXCOM G7 SENSOR MISC: 3.0000 | 4 refills | Status: AC

## 2023-11-19 MED ORDER — TRESIBA FLEXTOUCH 200 UNIT/ML ~~LOC~~ SOPN: 80.0000 [IU] | PEN_INJECTOR | Freq: Every day | SUBCUTANEOUS | 3 refills | Status: DC

## 2023-11-19 NOTE — Addendum Note (Signed)
 Addended by: Pollie Meyer on: 11/19/2023 01:19 PM   Modules accepted: Orders

## 2023-11-19 NOTE — Patient Instructions (Addendum)
 Please continue: - Metformin 500 mg 2x a day - Tresiba 80 units daily - Ozempic 1 mg weekly or every 1.5 weekly  Use: - Lyumjev: 40 units before a smaller meal 50 units before a regular meal 60-70 units before a large meal You may need to take 10-15 units of Lyumjev insulin only for correction.  Try to start the Dexcom CGM.  Please continue Levothyroxine 175 mcg daily.   Take the thyroid hormone every day, with water, at least 30 minutes before breakfast, separated by at least 4 hours from: - acid reflux medications - calcium - iron - multivitamins   Please return in 2-3 months.

## 2023-11-20 ENCOUNTER — Other Ambulatory Visit: Payer: Self-pay | Admitting: Internal Medicine

## 2023-11-20 MED ORDER — TOUJEO SOLOSTAR 300 UNIT/ML ~~LOC~~ SOPN: 80.0000 [IU] | PEN_INJECTOR | Freq: Every day | SUBCUTANEOUS | 3 refills | Status: DC

## 2023-11-21 ENCOUNTER — Other Ambulatory Visit: Payer: Self-pay | Admitting: Internal Medicine

## 2023-11-21 MED ORDER — ERGOCALCIFEROL 1.25 MG (50000 UT) PO CAPS: 50000.0000 [IU] | ORAL_CAPSULE | ORAL | 3 refills | Status: DC

## 2023-11-21 NOTE — Telephone Encounter (Signed)
 Copied from CRM [id number]. Topic: Clinical - Medication Refill >> Nov 21, 2023  3:18 PM Cammy Copa D wrote: Most Recent Primary Care Visit:  Provider: BURNS, Bobette Mo  Department: [facility]  Visit Type: MYCHART VIDEO VISIT  Date: 05/23/2023  Medication: ergocalciferol (VITAMIN D2) 1.25 MG (50000 UT) capsule  Has the patient contacted their pharmacy? Yes (Agent: If no, request that the patient contact the pharmacy for the refill. If patient does not wish to contact the pharmacy document the reason why and proceed with request.) (Agent: If yes, when and what did the pharmacy advise?) They said they had to reach out to the PR to get it filled but the medication was never filled.  Is this the correct pharmacy for this prescription? Yes If no, delete pharmacy and type the correct one.  This is the patient's preferred pharmacy:   [id number] - [place], [place] - [address] [facility] [address] [place] [address] Phone: [phone] Fax: [phone]   Has the prescription been filled recently? No  Is the patient out of the medication? Yes  Has the patient been seen for an appointment in the last year OR does the patient have an upcoming appointment? Yes  Can we respond through MyChart? Yes  Agent: Please be advised that Rx refills may take up to 3 business days. We ask that you follow-up with your pharmacy.

## 2023-11-24 ENCOUNTER — Other Ambulatory Visit: Payer: Self-pay | Admitting: Internal Medicine

## 2023-11-24 NOTE — Telephone Encounter (Signed)
 Copied from CRM [id number]. Topic: Clinical - Prescription Issue >> Nov 24, 2023 11:30 AM Saverio Danker wrote: Reason for CRM: Angus Palms from [facility] called stating the received a prescription for vitamin d2 capsules. She stated this was sent to the wrong pharmacy should have went to [name] for patient

## 2023-11-25 ENCOUNTER — Other Ambulatory Visit: Payer: Self-pay | Admitting: Student

## 2023-11-25 ENCOUNTER — Other Ambulatory Visit: Payer: Self-pay | Admitting: Internal Medicine

## 2023-11-25 NOTE — Telephone Encounter (Signed)
 Prescription refused - overdue for appointment.

## 2023-12-01 DIAGNOSIS — H35033 Hypertensive retinopathy, bilateral: Secondary | ICD-10-CM | POA: Diagnosis not present

## 2023-12-01 DIAGNOSIS — H2513 Age-related nuclear cataract, bilateral: Secondary | ICD-10-CM | POA: Diagnosis not present

## 2023-12-01 DIAGNOSIS — H353231 Exudative age-related macular degeneration, bilateral, with active choroidal neovascularization: Secondary | ICD-10-CM | POA: Diagnosis not present

## 2023-12-01 DIAGNOSIS — H43813 Vitreous degeneration, bilateral: Secondary | ICD-10-CM | POA: Diagnosis not present

## 2023-12-01 LAB — HM DIABETES EYE EXAM

## 2023-12-02 ENCOUNTER — Telehealth: Payer: Self-pay | Admitting: Internal Medicine

## 2023-12-02 ENCOUNTER — Other Ambulatory Visit: Payer: Self-pay

## 2023-12-02 MED ORDER — ERGOCALCIFEROL 1.25 MG (50000 UT) PO CAPS: 50000.0000 [IU] | ORAL_CAPSULE | ORAL | 3 refills | Status: AC

## 2023-12-02 NOTE — Telephone Encounter (Signed)
 Spoke with patient today.

## 2023-12-02 NOTE — Telephone Encounter (Signed)
 Copied from CRM [id number]. Topic: Clinical - Prescription Issue >> Dec 02, 2023  2:31 PM Deaijah H wrote: Reason for CRM: Patient would like to be up to date on injections Flu/Shingle & B12 shot. Also her Vitamin D prescription was sent to incorrect pharmacy on 3/28. Needs to be sent to [id number] - [place], [place] - [address]

## 2023-12-03 ENCOUNTER — Encounter: Payer: Self-pay | Admitting: Internal Medicine

## 2023-12-03 NOTE — Progress Notes (Addendum)
 Subjective:    Patient ID: Mary Acosta, female    DOB: 05/01/1956, 68 y.o.   MRN: [id number]     HPI Lakendria is here for follow up of her chronic medical problems.  B12 level has been low in the past.  She wonders about B12 injections.  Request for referral for physical therapy-has not been leaving her house much and her walking is limiting her walking - she can not walk far or long.    Sugar avg x 14 days - 244  In Jan - had episode  - vision was distorted - the shape and color of her chair was different - it took 2-3 days for it to come back in the right eye, but it did not come back in the left eye  She is concerned about her memory.  She was thinking about going back to see neurology who she has not seen in a while.  She is not sure if this is normal for aging or more than normal.  She has difficulty remembering certain names recalling certain conversations.  Medications and allergies reviewed with patient and updated if appropriate.  Current Outpatient Medications on File Prior to Visit  Medication Sig Dispense Refill   albuterol  (VENTOLIN  HFA) 108 (90 Base) MCG/ACT inhaler INHALE TWO (2) PUFFS BY MOUTH EVERY 6 HOURS AS NEEDED FOR SHORTNESS OF BREATH AND WHEEZING 18 g 10   Albuterol  Sulfate (PROAIR  RESPICLICK) 108 (90 Base) MCG/ACT AEPB 1 puff as needed Inhalation every 4 hrs     amLODipine  (NORVASC ) 5 MG tablet TAKE 1 TABLET BY MOUTH EVERY MORNING AND EVERY DAY AT BEDTIME 60 tablet 10   atomoxetine (STRATTERA) 40 MG capsule Take 40 mg by mouth daily.     Buprenorphine HCl 300 MCG FILM Place 1 Film inside cheek 2 (two) times daily.     busPIRone  (BUSPAR ) 15 MG tablet Take 15 mg by mouth 2 (two) times daily.     celecoxib  (CELEBREX ) 100 MG capsule TAKE 1 CAPSULE BY MOUTH TWICE A DAY 60 capsule 0   clonazePAM  (KLONOPIN ) 0.5 MG tablet Take 0.5 mg by mouth daily as needed.     cloNIDine (CATAPRES) 0.1 MG tablet Take 0.1 mg by mouth every 8 (eight) hours as needed.      Continuous Blood Gluc Sensor (FREESTYLE LIBRE 3 SENSOR) MISC 1 each by Does not apply route every 14 (fourteen) days. 6 each 3   Continuous Glucose Receiver (DEXCOM G7 RECEIVER) DEVI See admin instructions.     Continuous Glucose Sensor (DEXCOM G7 SENSOR) MISC 3 each by Does not apply route every 30 (thirty) days. Apply 1 sensor every 10 days 9 each 4   ergocalciferol  (VITAMIN D2) 1.25 MG (50000 UT) capsule Take 1 capsule (50,000 Units total) by mouth once a week. Monday 12 capsule 3   fexofenadine (ALLEGRA) 180 MG tablet Take 180 mg by mouth daily as needed for allergies or rhinitis.     folic acid (FOLVITE) 1 MG tablet Take 1 mg by mouth daily.     furosemide  (LASIX ) 40 MG tablet TAKE 1 TABLET BY MOUTH EACH MORNING 30 tablet 10   gabapentin  (NEURONTIN ) 300 MG capsule Take 300 mg by mouth 3 (three) times daily as needed (pain).     hydrALAZINE  (APRESOLINE ) 25 MG tablet TAKE ONE TABLET BY MOUTH AT BREAKFAST AND AT BEDTIME May take extra tab if needed if bp over 170/80 225 tablet 3   hydrOXYzine  (VISTARIL ) 50 MG capsule  Take 50 mg by mouth 2 (two) times daily as needed for itching or anxiety.     insulin  glargine, 1 Unit Dial, (TOUJEO  SOLOSTAR) 300 UNIT/ML Solostar Pen Inject 80 Units into the skin daily. 9 mL 3   Insulin  Pen Needle (BD PEN NEEDLE NANO U/F) 32G X 4 MM MISC USE AS DIRECTED FIVE TIMES A DAY 100 each 10   Insulin  Syringe-Needle U-100 26G X 1/2" 1 ML MISC Use daily for insulin  injection as directed 100 each 3   Ipratropium-Albuterol  (COMBIVENT  RESPIMAT) 20-100 MCG/ACT AERS respimat Inhale 1 puff into the lungs every 6 (six) hours as needed for wheezing. 4 g 5   ipratropium-albuterol  (DUONEB) 0.5-2.5 (3) MG/3ML SOLN Take 3 mLs by nebulization every 6 (six) hours as needed (Wheezing or dyspnea.). 360 mL 11   levothyroxine  (SYNTHROID ) 175 MCG tablet TAKE 1 TABLET BY MOUTH ONCE DAILY BEFORE BREAKFAST 30 tablet 10   metFORMIN  (GLUCOPHAGE ) 500 MG tablet TAKE 1 TABLET BY MOUTH EACH MORNING  AND TAKE 1 TABLET EVERY DAY AT BEDTIME 180 tablet 3   metoprolol  succinate (TOPROL -XL) 25 MG 24 hr tablet TAKE 1 TABLET BY MOUTH EVERY DAY AT BEDTIME WITH OR IMMEDIATLY FOLLOWING A MEAL 30 tablet 4   naloxone  (NARCAN ) nasal spray 4 mg/0.1 mL One spray in either nostril once for known/suspected opioid overdose. May repeat every 2-3 minutes in alternating nostril til EMS arrives     OXYGEN  Inhale 3 L into the lungs at bedtime.     PARoxetine  (PAXIL ) 20 MG tablet Take 20 mg by mouth at bedtime.     PARoxetine  (PAXIL ) 40 MG tablet Take 1 tablet (40 mg total) by mouth daily. 90 tablet 1   potassium chloride  SA (KLOR-CON  M20) 20 MEQ tablet Take 2 tab daily 180 tablet 3   promethazine  (PHENERGAN ) 25 MG tablet TAKE 1/2 TABLET BY MOUTH EVERY 8 HOURS AS NEEDED FOR NAUSEA AND VOMITING 30 tablet 2   Propylene Glycol (SYSTANE BALANCE) 0.6 % SOLN Place 1 drop into both eyes daily as needed (dry eyes).     Semaglutide , 1 MG/DOSE, (OZEMPIC , 1 MG/DOSE,) 4 MG/3ML SOPN Inject 1 mg into the skin once a week. 9 mL 3   simvastatin  (ZOCOR ) 20 MG tablet TAKE 1 TABLET BY MOUTH EVERY DAY AT BEDTIME 90 tablet 1   tiZANidine  (ZANAFLEX ) 4 MG tablet Take 4 mg by mouth every 8 (eight) hours as needed for muscle spasms.     TRESIBA  FLEXTOUCH 200 UNIT/ML FlexTouch Pen SMARTSIG:80 Unit(s) SUB-Q Daily     Adalimumab (HUMIRA PEN) 40 MG/0.4ML PNKT Inject 40 mg into the skin every 14 (fourteen) days. Every other Thursday (Patient not taking: Reported on 11/19/2023)     No current facility-administered medications on file prior to visit.     Review of Systems  Constitutional:  Negative for fever.  Respiratory:  Positive for shortness of breath (occ). Negative for cough and wheezing.   Cardiovascular:  Positive for chest pain (a couple of times with SOB) and leg swelling. Negative for palpitations.  Musculoskeletal:  Positive for arthralgias, back pain and gait problem.  Neurological:  Positive for light-headedness. Negative for  headaches.       Objective:   Vitals:   12/04/23 1042 12/04/23 1134  BP: (!) 146/80 (!) 140/78  Pulse: 74   Temp: 98.3 F (36.8 C)   SpO2: 94%    BP Readings from Last 3 Encounters:  12/04/23 (!) 140/78  11/19/23 130/70  10/10/23 114/60   Wt Readings from  Last 3 Encounters:  12/04/23 300 lb (136.1 kg)  11/19/23 (!) 316 lb 3.2 oz (143.4 kg)  10/10/23 (!) 308 lb 4 oz (139.8 kg)   Body mass index is 46.29 kg/m.    Physical Exam Constitutional:      General: She is not in acute distress.    Appearance: Normal appearance.  HENT:     Head: Normocephalic and atraumatic.  Eyes:     Conjunctiva/sclera: Conjunctivae normal.  Cardiovascular:     Rate and Rhythm: Normal rate and regular rhythm.     Heart sounds: Normal heart sounds.  Pulmonary:     Effort: Pulmonary effort is normal. No respiratory distress.     Breath sounds: Normal breath sounds. No wheezing.  Musculoskeletal:     Cervical back: Neck supple.     Right lower leg: No edema.     Left lower leg: No edema.  Lymphadenopathy:     Cervical: No cervical adenopathy.  Skin:    General: Skin is warm and dry.     Findings: No rash.  Neurological:     Mental Status: She is alert. Mental status is at baseline.  Psychiatric:        Mood and Affect: Mood normal.        Behavior: Behavior normal.        Lab Results  Component Value Date   WBC 7.4 05/27/2023   HGB 12.1 05/27/2023   HCT 40.5 05/27/2023   PLT 172.0 05/27/2023   GLUCOSE 648 (HH) 05/27/2023   CHOL 163 05/27/2023   TRIG 215.0 (H) 05/27/2023   HDL 32.30 (L) 05/27/2023   LDLCALC 88 05/27/2023   ALT 17 10/10/2023   AST 19 10/10/2023   NA 127 (L) 05/27/2023   K 5.1 05/27/2023   CL 90 (L) 05/27/2023   CREATININE 1.30 (H) 10/22/2023   BUN 18 10/10/2023   CO2 26 05/27/2023   TSH 3.01 05/27/2023   INR 1.1 (H) 06/27/2022   HGBA1C 11.5 (A) 11/19/2023   MICROALBUR <0.7 05/27/2023     Assessment & Plan:    See Problem List for Assessment  and Plan of chronic medical problems.

## 2023-12-04 ENCOUNTER — Ambulatory Visit (INDEPENDENT_AMBULATORY_CARE_PROVIDER_SITE_OTHER): Admitting: Internal Medicine

## 2023-12-04 VITALS — BP 140/78 | HR 74 | Temp 98.3°F | Ht 67.5 in | Wt 300.0 lb

## 2023-12-04 DIAGNOSIS — E538 Deficiency of other specified B group vitamins: Secondary | ICD-10-CM

## 2023-12-04 DIAGNOSIS — I10 Essential (primary) hypertension: Secondary | ICD-10-CM

## 2023-12-04 DIAGNOSIS — M109 Gout, unspecified: Secondary | ICD-10-CM

## 2023-12-04 DIAGNOSIS — R5381 Other malaise: Secondary | ICD-10-CM

## 2023-12-04 MED ORDER — CYANOCOBALAMIN 1000 MCG/ML IJ SOLN
1000.0000 ug | Freq: Once | INTRAMUSCULAR | Status: AC
Start: 2023-12-04 — End: 2023-12-04
  Administered 2023-12-04: 1000 ug via INTRAMUSCULAR

## 2023-12-04 MED ORDER — COLCHICINE 0.6 MG PO TABS: 0.6000 mg | ORAL_TABLET | Freq: Every day | ORAL | 5 refills | Status: DC

## 2023-12-04 NOTE — Assessment & Plan Note (Signed)
 Chronic B12 supplementation - can do SL drops or B12 injections but I do not think the injection would be easy for her

## 2023-12-04 NOTE — Assessment & Plan Note (Signed)
 Acute on chronic  Has not left the house much in the past few months She is not able to walk long distances and is overall deconditioned Refer to physical therapy

## 2023-12-04 NOTE — Patient Instructions (Addendum)
        Medications changes include :   colchicine one pill daily    A referral was ordered for physical therapy and someone will call you to schedule an appointment.     Return in about 6 months (around 06/04/2024) for follow up.

## 2023-12-04 NOTE — Assessment & Plan Note (Signed)
 Chronic She has noted that colchicine helps her arthritis She tolerates medication well Start colchicine 0.6 mg daily - can increase if needed

## 2023-12-04 NOTE — Assessment & Plan Note (Addendum)
 Chronic Blood pressure slightly elevated here but has been better controlled Advised checking BP regularly at home Continue amlodipine 5 mg daily, hydralazine 25 mg twice daily, metoprolol 25 mg twice daily

## 2023-12-11 DIAGNOSIS — J449 Chronic obstructive pulmonary disease, unspecified: Secondary | ICD-10-CM | POA: Diagnosis not present

## 2023-12-12 ENCOUNTER — Other Ambulatory Visit: Payer: Self-pay | Admitting: Gastroenterology

## 2023-12-12 DIAGNOSIS — R110 Nausea: Secondary | ICD-10-CM

## 2023-12-15 ENCOUNTER — Ambulatory Visit

## 2023-12-15 NOTE — Telephone Encounter (Signed)
 Dr Dominic Friendly can the patient have a refill of phenergan ? We received an electronic request

## 2023-12-17 ENCOUNTER — Telehealth: Payer: Self-pay | Admitting: Gastroenterology

## 2023-12-17 ENCOUNTER — Other Ambulatory Visit: Payer: Self-pay

## 2023-12-17 MED ORDER — PROMETHAZINE HCL 25 MG PO TABS: 25.0000 mg | ORAL_TABLET | Freq: Four times a day (QID) | ORAL | 1 refills | Status: DC | PRN

## 2023-12-17 NOTE — Telephone Encounter (Signed)
 Dr Dominic Friendly, I believe that I sent this to you already in a electronic refill request, Last seen 05/2023 patient is requesting Phenergan  refills from you.. is this ok to refill?

## 2023-12-17 NOTE — Telephone Encounter (Signed)
 I sent a new prescription for the promethazine .  H Danis

## 2023-12-17 NOTE — Telephone Encounter (Signed)
 Patient called and stated that she is needing a refill for Promethazine  25 MG. Please advise.

## 2023-12-18 MED ORDER — PROMETHAZINE HCL 25 MG PO TABS: 25.0000 mg | ORAL_TABLET | Freq: Four times a day (QID) | ORAL | 1 refills | Status: DC | PRN

## 2023-12-18 NOTE — Telephone Encounter (Signed)
 I resent Promethazine  to [facility] on [address]. Tried to call patient no answer and could not leave a message. Voicemail full

## 2023-12-18 NOTE — Addendum Note (Signed)
 Addended by: Chrisandra Counts on: 12/18/2023 03:39 PM   Modules accepted: Orders

## 2023-12-18 NOTE — Telephone Encounter (Signed)
 Inbound call from [facility], state they don't carry Promethazine . Need another medication sent it or that one sent elsewhere.

## 2023-12-23 ENCOUNTER — Telehealth: Payer: Self-pay | Admitting: Internal Medicine

## 2023-12-23 ENCOUNTER — Ambulatory Visit (INDEPENDENT_AMBULATORY_CARE_PROVIDER_SITE_OTHER)

## 2023-12-23 VITALS — Ht 67.5 in | Wt 300.0 lb

## 2023-12-23 DIAGNOSIS — Z0000 Encounter for general adult medical examination without abnormal findings: Secondary | ICD-10-CM

## 2023-12-23 DIAGNOSIS — Z1211 Encounter for screening for malignant neoplasm of colon: Secondary | ICD-10-CM | POA: Diagnosis not present

## 2023-12-23 DIAGNOSIS — Z122 Encounter for screening for malignant neoplasm of respiratory organs: Secondary | ICD-10-CM | POA: Diagnosis not present

## 2023-12-23 DIAGNOSIS — R413 Other amnesia: Secondary | ICD-10-CM

## 2023-12-23 DIAGNOSIS — Z780 Asymptomatic menopausal state: Secondary | ICD-10-CM | POA: Diagnosis not present

## 2023-12-23 DIAGNOSIS — Z1212 Encounter for screening for malignant neoplasm of rectum: Secondary | ICD-10-CM

## 2023-12-23 NOTE — Patient Instructions (Addendum)
 Mary Acosta , Thank you for taking time to come for your Medicare Wellness Visit. I appreciate your ongoing commitment to your health goals. Please review the following plan we discussed and let me know if I can assist you in the future.   Referrals/Orders/Follow-Ups/Clinician Recommendations: It was nice talking to you today.  You are due for a Tetanus and Shingles vaccine.  You are due for a Cologuard.  Please watch out for kit coming to your resident.  You have an order for:   [x]   Bone Density     Please call for appointment:  [facility] [address] [place], [place] [address] [phone]  Make sure to wear two-piece clothing.  No lotions, powders, or deodorants the day of the appointment. Make sure to bring picture ID and insurance card.  Bring list of medications you are currently taking including any supplements.    This is a list of the screening recommended for you and due dates:  Health Maintenance  Topic Date Due   DTaP/Tdap/Td vaccine (1 - Tdap) Never done   Zoster (Shingles) Vaccine (1 of 2) Never done   Colon Cancer Screening  11/25/2017   Screening for Lung Cancer  03/31/2019   COVID-19 Vaccine (3 - Moderna risk series) 11/30/2019   DEXA scan (bone density measurement)  06/15/2021   Medicare Annual Wellness Visit  06/07/2023   Flu Shot  03/26/2024   Hemoglobin A1C  05/21/2024   Yearly kidney health urinalysis for diabetes  05/26/2024   Yearly kidney function blood test for diabetes  10/09/2024   Complete foot exam   11/18/2024   Eye exam for diabetics  11/30/2024   Mammogram  10/13/2025   Pneumonia Vaccine  Completed   Hepatitis C Screening  Completed   HPV Vaccine  Aged Out   Meningitis B Vaccine  Aged Out    Advanced directives: (Provided) Advance directive discussed with you today. I have provided a copy for you to complete at home and have notarized. Once this is complete, please bring a copy in to our office so we can  scan it into your chart.   Next Medicare Annual Wellness Visit scheduled for next year: Yes  Managing Pain Without Opioids Opioids are strong medicines used to treat moderate to severe pain. For some people, especially those who have long-term (chronic) pain, opioids may not be the best choice for pain management due to: Side effects like nausea, constipation, and sleepiness. The risk of addiction (opioid use disorder). The longer you take opioids, the greater your risk of addiction. Pain that lasts for more than 3 months is called chronic pain. Managing chronic pain usually requires more than one approach and is often provided by a team of health care providers working together (multidisciplinary approach). Pain management may be done at a pain management center or pain clinic. How to manage pain without the use of opioids Use non-opioid medicines Non-opioid medicines for pain may include: Over-the-counter or prescription non-steroidal anti-inflammatory drugs (NSAIDs). These may be the first medicines used for pain. They work well for muscle and bone pain, and they reduce swelling. Acetaminophen . This over-the-counter medicine may work well for milder pain but not swelling. Antidepressants. These may be used to treat chronic pain. A certain type of antidepressant (tricyclics) is often used. These medicines are given in lower doses for pain than when used for depression. Anticonvulsants. These are usually used to treat seizures but may also reduce nerve (neuropathic) pain. Muscle relaxants.  These relieve pain caused by sudden muscle tightening (spasms). You may also use a pain medicine that is applied to the skin as a patch, cream, or gel (topical analgesic), such as a numbing medicine. These may cause fewer side effects than medicines taken by mouth. Do certain therapies as directed Some therapies can help with pain management. They include: Physical therapy. You will do exercises to gain  strength and flexibility. A physical therapist may teach you exercises to move and stretch parts of your body that are weak, stiff, or painful. You can learn these exercises at physical therapy visits and practice them at home. Physical therapy may also involve: Massage. Heat wraps or applying heat or cold to affected areas. Electrical signals that interrupt pain signals (transcutaneous electrical nerve stimulation, TENS). Weak lasers that reduce pain and swelling (low-level laser therapy). Signals from your body that help you learn to regulate pain (biofeedback). Occupational therapy. This helps you to learn ways to function at home and work with less pain. Recreational therapy. This involves trying new activities or hobbies, such as a physical activity or drawing. Mental health therapy, including: Cognitive behavioral therapy (CBT). This helps you learn coping skills for dealing with pain. Acceptance and commitment therapy (ACT) to change the way you think and react to pain. Relaxation therapies, including muscle relaxation exercises and mindfulness-based stress reduction. Pain management counseling. This may be individual, family, or group counseling.  Receive medical treatments Medical treatments for pain management include: Nerve block injections. These may include a pain blocker and anti-inflammatory medicines. You may have injections: Near the spine to relieve chronic back or neck pain. Into joints to relieve back or joint pain. Into nerve areas that supply a painful area to relieve body pain. Into muscles (trigger point injections) to relieve some painful muscle conditions. A medical device placed near your spine to help block pain signals and relieve nerve pain or chronic back pain (spinal cord stimulation device). Acupuncture. Follow these instructions at home Medicines Take over-the-counter and prescription medicines only as told by your health care provider. If you are taking  pain medicine, ask your health care providers about possible side effects to watch out for. Do not drive or use heavy machinery while taking prescription opioid pain medicine. Lifestyle  Do not use drugs or alcohol to reduce pain. If you drink alcohol, limit how much you have to: 0-1 drink a day for women who are not pregnant. 0-2 drinks a day for men. Know how much alcohol is in a drink. In the [place] one drink equals one 12 oz bottle of beer (355 mL), one 5 oz glass of wine (148 mL), or one 1 oz glass of hard liquor (44 mL). Do not use any products that contain nicotine or tobacco. These products include cigarettes, chewing tobacco, and vaping devices, such as e-cigarettes. If you need help quitting, ask your health care provider. Eat a healthy diet and maintain a healthy weight. Poor diet and excess weight may make pain worse. Eat foods that are high in fiber. These include fresh fruits and vegetables, whole grains, and beans. Limit foods that are high in fat and processed sugars, such as fried and sweet foods. Exercise regularly. Exercise lowers stress and may help relieve pain. Ask your health care provider what activities and exercises are safe for you. If your health care provider approves, join an exercise class that combines movement and stress reduction. Examples include yoga and tai chi. Get enough sleep. Lack of sleep may make  pain worse. Lower stress as much as possible. Practice stress reduction techniques as told by your therapist. General instructions Work with all your pain management providers to find the treatments that work best for you. You are an important member of your pain management team. There are many things you can do to reduce pain on your own. Consider joining an online or in-person support group for people who have chronic pain. Keep all follow-up visits. This is important. Where to find more information You can find more information about managing pain without  opioids from: American Academy of Pain Medicine: painmed.org Institute for Chronic Pain: instituteforchronicpain.org American Chronic Pain Association: theacpa.org Contact a health care provider if: You have side effects from pain medicine. Your pain gets worse or does not get better with treatments or home therapy. You are struggling with anxiety or depression. Summary Many types of pain can be managed without opioids. Chronic pain may respond better to pain management without opioids. Pain is best managed when you and a team of health care providers work together. Pain management without opioids may include non-opioid medicines, medical treatments, physical therapy, mental health therapy, and lifestyle changes. Tell your health care providers if your pain gets worse or is not being managed well enough. This information is not intended to replace advice given to you by your health care provider. Make sure you discuss any questions you have with your health care provider. Document Revised: 11/22/2020 Document Reviewed: 11/22/2020 Elsevier Patient Education  2024 [employer].

## 2023-12-23 NOTE — Progress Notes (Signed)
 Subjective:   Mary Acosta is a 68 y.o. who presents for a Medicare Wellness preventive visit.  Visit Complete: Virtual I connected with  Mary Acosta on 12/23/23 by a audio enabled telemedicine application and verified that I am speaking with the correct person using two identifiers.  Patient Location: Home  Provider Location: Home Office  I discussed the limitations of evaluation and management by telemedicine. The patient expressed understanding and agreed to proceed.  Vital Signs: Because this visit was a virtual/telehealth visit, some criteria may be missing or patient reported. Any vitals not documented were not able to be obtained and vitals that have been documented are patient reported.  VideoDeclined- This patient declined Librarian, academic. Therefore the visit was completed with audio only.  Persons Participating in Visit: Patient.  AWV Questionnaire: No: Patient Medicare AWV questionnaire was not completed prior to this visit.  Cardiac Risk Factors include: advanced age (>110men, >30 women);hypertension;obesity (BMI >30kg/m2);Other (see comment)     Objective:    Today's Vitals   12/23/23 1509 12/23/23 1512  Weight: 300 lb (136.1 kg)   Height: 5' 7.5" (1.715 m)   PainSc:  8    Body mass index is 46.29 kg/m.     12/23/2023    3:24 PM 02/27/2023   10:07 AM 06/06/2022    1:19 PM 02/05/2022    5:52 PM 11/19/2021    8:47 AM 05/28/2021    1:13 PM 01/28/2020    3:45 PM  Advanced Directives  Does Patient Have a Medical Advance Directive? No No No No No No No  Type of Advance Directive      Living will;Healthcare Power of Attorney   Does patient want to make changes to medical advance directive?      No - Patient declined   Copy of Healthcare Power of Attorney in Chart?      No - copy requested   Would patient like information on creating a medical advance directive? Yes (MAU/Ambulatory/Procedural Areas - Information given)  No - Patient declined No - Patient declined No - Patient declined Yes (MAU/Ambulatory/Procedural Areas - Information given) No - Patient declined No - Patient declined    Current Medications (verified) Outpatient Encounter Medications as of 12/23/2023  Medication Sig   Adalimumab (HUMIRA PEN) 40 MG/0.4ML PNKT Inject 40 mg into the skin every 14 (fourteen) days. Every other Thursday   albuterol  (VENTOLIN  HFA) 108 (90 Base) MCG/ACT inhaler INHALE TWO (2) PUFFS BY MOUTH EVERY 6 HOURS AS NEEDED FOR SHORTNESS OF BREATH AND WHEEZING   Albuterol  Sulfate (PROAIR  RESPICLICK) 108 (90 Base) MCG/ACT AEPB 1 puff as needed Inhalation every 4 hrs   amLODipine  (NORVASC ) 5 MG tablet TAKE 1 TABLET BY MOUTH EVERY MORNING AND EVERY DAY AT BEDTIME   atomoxetine (STRATTERA) 40 MG capsule Take 40 mg by mouth daily.   Buprenorphine HCl 300 MCG FILM Place 1 Film inside cheek 2 (two) times daily.   busPIRone  (BUSPAR ) 15 MG tablet Take 15 mg by mouth 2 (two) times daily.   celecoxib  (CELEBREX ) 100 MG capsule TAKE 1 CAPSULE BY MOUTH TWICE A DAY   clonazePAM  (KLONOPIN ) 0.5 MG tablet Take 0.5 mg by mouth daily as needed.   cloNIDine (CATAPRES) 0.1 MG tablet Take 0.1 mg by mouth every 8 (eight) hours as needed.   colchicine  0.6 MG tablet Take 1 tablet (0.6 mg total) by mouth daily.   Continuous Blood Gluc Sensor (FREESTYLE LIBRE 3 SENSOR) MISC 1 each by  Does not apply route every 14 (fourteen) days.   Continuous Glucose Receiver (DEXCOM G7 RECEIVER) DEVI See admin instructions.   Continuous Glucose Sensor (DEXCOM G7 SENSOR) MISC 3 each by Does not apply route every 30 (thirty) days. Apply 1 sensor every 10 days   ergocalciferol  (VITAMIN D2) 1.25 MG (50000 UT) capsule Take 1 capsule (50,000 Units total) by mouth once a week. Monday   fexofenadine (ALLEGRA) 180 MG tablet Take 180 mg by mouth daily as needed for allergies or rhinitis.   folic acid (FOLVITE) 1 MG tablet Take 1 mg by mouth daily.   furosemide  (LASIX ) 40 MG tablet  TAKE 1 TABLET BY MOUTH EACH MORNING   gabapentin  (NEURONTIN ) 300 MG capsule Take 300 mg by mouth 3 (three) times daily as needed (pain).   hydrALAZINE  (APRESOLINE ) 25 MG tablet TAKE ONE TABLET BY MOUTH AT BREAKFAST AND AT BEDTIME May take extra tab if needed if bp over 170/80   hydrOXYzine  (VISTARIL ) 50 MG capsule Take 50 mg by mouth 2 (two) times daily as needed for itching or anxiety.   insulin  glargine, 1 Unit Dial, (TOUJEO  SOLOSTAR) 300 UNIT/ML Solostar Pen Inject 80 Units into the skin daily.   Insulin  Pen Needle (BD PEN NEEDLE NANO U/F) 32G X 4 MM MISC USE AS DIRECTED FIVE TIMES A DAY   Insulin  Syringe-Needle U-100 26G X 1/2" 1 ML MISC Use daily for insulin  injection as directed   Ipratropium-Albuterol  (COMBIVENT  RESPIMAT) 20-100 MCG/ACT AERS respimat Inhale 1 puff into the lungs every 6 (six) hours as needed for wheezing.   ipratropium-albuterol  (DUONEB) 0.5-2.5 (3) MG/3ML SOLN Take 3 mLs by nebulization every 6 (six) hours as needed (Wheezing or dyspnea.).   levothyroxine  (SYNTHROID ) 175 MCG tablet TAKE 1 TABLET BY MOUTH ONCE DAILY BEFORE BREAKFAST   metFORMIN  (GLUCOPHAGE ) 500 MG tablet TAKE 1 TABLET BY MOUTH EACH MORNING AND TAKE 1 TABLET EVERY DAY AT BEDTIME   metoprolol  succinate (TOPROL -XL) 25 MG 24 hr tablet TAKE 1 TABLET BY MOUTH EVERY DAY AT BEDTIME WITH OR IMMEDIATLY FOLLOWING A MEAL   naloxone  (NARCAN ) nasal spray 4 mg/0.1 mL One spray in either nostril once for known/suspected opioid overdose. May repeat every 2-3 minutes in alternating nostril til EMS arrives   OXYGEN  Inhale 3 L into the lungs at bedtime.   PARoxetine  (PAXIL ) 20 MG tablet Take 20 mg by mouth at bedtime.   PARoxetine  (PAXIL ) 40 MG tablet Take 1 tablet (40 mg total) by mouth daily.   potassium chloride  SA (KLOR-CON  M20) 20 MEQ tablet Take 2 tab daily   promethazine  (PHENERGAN ) 25 MG tablet Take 1 tablet (25 mg total) by mouth every 6 (six) hours as needed for nausea or vomiting.   Propylene Glycol (SYSTANE BALANCE)  0.6 % SOLN Place 1 drop into both eyes daily as needed (dry eyes).   Semaglutide , 1 MG/DOSE, (OZEMPIC , 1 MG/DOSE,) 4 MG/3ML SOPN Inject 1 mg into the skin once a week.   simvastatin  (ZOCOR ) 20 MG tablet TAKE 1 TABLET BY MOUTH EVERY DAY AT BEDTIME   tiZANidine  (ZANAFLEX ) 4 MG tablet Take 4 mg by mouth every 8 (eight) hours as needed for muscle spasms.   TRESIBA  FLEXTOUCH 200 UNIT/ML FlexTouch Pen SMARTSIG:80 Unit(s) SUB-Q Daily   No facility-administered encounter medications on file as of 12/23/2023.    Allergies (verified) Gabapentin , Losartan, Aricept  [donepezil  hcl], Aripiprazole, Hydroxyzine , Oxycodone-acetaminophen , Oxycodone, Sulfa antibiotics, and Sulfonamide derivatives   History: Past Medical History:  Diagnosis Date   AKI (acute kidney injury) (HCC) 01/2017  Allergy    seasonal   Anxiety    with panic attacks   Arthritis    "back; feet; hands; shoulders" (08/26/2014)   Asthma    Cataract    forming   Cervical cancer (HCC)    Chronic lower back pain    Chronic narcotic use    Chronic pain syndrome    [facility]   Cirrhosis (HCC)    Clostridium difficile infection 2017   COPD (chronic obstructive pulmonary disease) (HCC)    Daily headache    past hx   Depression    Diabetic neuropathy (HCC) 06/04/2017   feet  and legs , some in hands   DJD (degenerative joint disease)    Fatty liver disease, nonalcoholic    Fibromyalgia    Frequency of urination    HCAP (healthcare-associated pneumonia) 08/26/2014   History of TIA (transient ischemic attack) 11/01/2010   NO RESIDUAL   Hyperlipidemia    Hypertension    Hypothyroidism    IDDM (insulin  dependent diabetes mellitus)    Insomnia    Lumbar stenosis    Macular degeneration    Memory difficulty 07/25/2016   Nocturia    OSA (obstructive sleep apnea)    NO CPAP SINCE WT LOSS   Osteoarthritis    with severe disease in knee   Oxygen  deficiency    4  liters at bedtime only   Pneumonia "several  times"   Polymyalgia rheumatica (HCC)    Scoliosis    Seasonal allergies    Stroke (HCC)    TIA   Thyroid  cancer (HCC)    Urgency of urination    Vaginal pain S/P SLING  FEB 2012   Past Surgical History:  Procedure Laterality Date   APPENDECTOMY  1982   BREAST EXCISIONAL BIOPSY Left 02/28/2005   Atypical Ductal Hyperplasia   CARDIAC CATHETERIZATION  09/04/2004   NORMAL CORONARY ANATOMY/ NORMAL LVF/ EF 60%   CARDIOVASCULAR STRESS TEST  12-27-2010  DR [place]   ABNORMAL NUCLEAR STUDY W/ /MILD INFERIOR ISCHEMIA/ EF 69%/  CT HEART ANGIOGRAM ;  NO ACUTE FINDINGS   COLONOSCOPY     CRYOABLATION  05/16/2003   w/LEEP FOR ABNORMAL PAP SMEAR   CYSTOSCOPY  05/18/2012   Procedure: CYSTOSCOPY;  Surgeon: Devorah Fonder, MD;  Location[facility];  Service: Urology;  Laterality: N/A;  examination under anethesia   ESOPHAGOGASTRODUODENOSCOPY (EGD) WITH PROPOFOL  N/A 09/03/2016   Procedure: ESOPHAGOGASTRODUODENOSCOPY (EGD) WITH PROPOFOL ;  Surgeon: Albertina Hugger, MD;  Location: [facility];  Service: Gastroenterology;  Laterality: N/A;   ESOPHAGOGASTRODUODENOSCOPY (EGD) WITH PROPOFOL  N/A 11/19/2021   Procedure: ESOPHAGOGASTRODUODENOSCOPY (EGD) WITH PROPOFOL ;  Surgeon: Albertina Hugger, MD;  Location: [facility];  Service: Gastroenterology;  Laterality: N/A;  Varices screeing, cirrhosis   HYSTEROSCOPY WITH D & C  08/19/2007   PMB   KNEE ARTHROSCOPY W/ DEBRIDEMENT Left 03/29/2006   INTERNAL DERANGEMENT/ SEVERE DJD/ MENISCUS TEARS   LAPAROSCOPIC CHOLECYSTECTOMY  06/10/2002   LAPAROSCOPIC GASTRIC BANDING  03/01/2006   TRUNCAL VAGOTOMY/ PLACEMENT OF VG BAND   REVISION TOTAL KNEE ARTHROPLASTY Left 08-29-2008; 05/2009   TONSILLECTOMY  1969   TOTAL KNEE ARTHROPLASTY Left 01/23/2007   SEVERE DJD   TOTAL THYROIDECTOMY  11/22/2005   BILATERAL THYROID  NODULES-- PAPILLARY CARCINOMA (0.5CM)/ ADENOMATOID NODULES   TRANSTHORACIC ECHOCARDIOGRAM  12/27/2010   LVSF NORMAL / EF 55-65%/  GRADE I DIASTOLIC DYSFUNCTION/ MILD MITRAL REGURG. / MILDLY DILATED LEFT ATRIUM/ MILDY INCREASED SYSTOLIC PRESSURE OF PULMONARY  ARTERIES   TRANSVAGINAL SUBURETERAL TAPE/ SLING  09/28/2010   MIXED URINARY INCONTINENCE   TUBAL LIGATION  1983   Family History  Problem Relation Age of Onset   Colon polyps Mother    Diabetes Mother    Heart disease Mother    Dementia Mother    Heart disease Father    High blood pressure Father    Colon cancer Maternal Uncle        x 3   Stomach cancer Paternal Uncle    Breast cancer Other        great aunts x 5   Esophageal cancer Neg Hx    Rectal cancer Neg Hx    Social History   Socioeconomic History   Marital status: Married    Spouse name: Darolyn Elks   Number of children: 2   Years of education: 12   Highest education level: Not on file  Occupational History   Occupation: disabled    Associate Professor: UNEMPLOYED  Tobacco Use   Smoking status: Former    Current packs/day: 0.00    Average packs/day: 2.5 packs/day for 39.0 years (97.5 ttl pk-yrs)    Types: Cigarettes    Start date: 10/23/1971    Quit date: 10/22/2010    Years since quitting: 13.1   Smokeless tobacco: Never  Vaping Use   Vaping status: Never Used  Substance and Sexual Activity   Alcohol use: No    Alcohol/week: 0.0 standard drinks of alcohol   Drug use: No   Sexual activity: Not Currently  Other Topics Concern   Not on file  Social History Narrative   Lives at home w/ her husband    Right-handed   Caffeine: 1 cup of coffee per week + [employer]      Lives with her husband and nephew and his wife.   Social Drivers of Corporate investment banker Strain: Low Risk  (12/23/2023)   Overall Financial Resource Strain (CARDIA)    Difficulty of Paying Living Expenses: Not very hard  Food Insecurity: No Food Insecurity (12/23/2023)   Hunger Vital Sign    Worried About Running Out of Food in the Last Year: Never true    Ran Out of Food in the Last Year: Never true  Transportation Needs: No  Transportation Needs (12/23/2023)   PRAPARE - Administrator, Civil Service (Medical): No    Lack of Transportation (Non-Medical): No  Physical Activity: Inactive (12/23/2023)   Exercise Vital Sign    Days of Exercise per Week: 0 days    Minutes of Exercise per Session: 0 min  Stress: Stress Concern Present (12/23/2023)   [employer] of Occupational Health - Occupational Stress Questionnaire    Feeling of Stress : Very much  Social Connections: Moderately Isolated (12/23/2023)   Social Connection and Isolation Panel [NHANES]    Frequency of Communication with Friends and Family: More than three times a week    Frequency of Social Gatherings with Friends and Family: Twice a week    Attends Religious Services: Never    Database administrator or Organizations: No    Attends Engineer, structural: Never    Marital Status: Married    Tobacco Counseling Counseling given: Not Answered    Clinical Intake:  Pre-visit preparation completed: Yes  Pain : 0-10 Pain Score: 8  Pain Type: Chronic pain Pain Location: Knee (both knees and back) Pain Onset: More than a month ago Pain Frequency: Constant Pain Relieving Factors: Pain Clinic  Pain Relieving Factors: Pain Clinic  BMI - recorded: 46.29 Nutritional Status: BMI > 30  Obese Nutritional Risks: Nausea/ vomitting/ diarrhea Diabetes: No  Lab Results  Component Value Date   HGBA1C 11.5 (A) 11/19/2023   HGBA1C 13.0 (H) 05/27/2023   HGBA1C 12.1 (A) 12/24/2022     How often do you need to have someone help you when you read instructions, pamphlets, or other written materials from your doctor or pharmacy?: 1 - Never  Interpreter Needed?: No  Information entered by :: Mary Acosta, RMA   Activities of Daily Living     12/23/2023    3:20 PM  In your present state of health, do you have any difficulty performing the following activities:  Hearing? 0  Vision? 0  Difficulty concentrating or making  decisions? 1  Walking or climbing stairs? 0  Dressing or bathing? 0  Doing errands, shopping? 0  Comment daughter and son in law drives her  Preparing Food and eating ? N  Using the Toilet? N  In the past six months, have you accidently leaked urine? Y  Do you have problems with loss of bowel control? N  Managing your Medications? N  Managing your Finances? N  Housekeeping or managing your Housekeeping? N    Patient Care Team: Colene Dauphin, MD as PCP - General (Internal Medicine) Audery Blazing Deannie Fabian, MD as PCP - Cardiology (Cardiology) Alyce Jubilee, MD as Referring Physician (Ophthalmology) [place], [facility] as Consulting Physician (Ophthalmology) Szabat, Tino Foreman, [facility] (Inactive) as Pharmacist (Pharmacist)  Indicate any recent Medical Services you may have received from other than [facility] providers in the past year (date may be approximate).     Assessment:   This is a routine wellness examination for Annelisa.  Hearing/Vision screen Hearing Screening - Comments:: Denies hearing difficulties   Vision Screening - Comments:: Wears eyeglasses   Goals Addressed   None    Depression Screen     12/23/2023    3:28 PM 12/04/2023   10:51 AM 07/12/2022    1:56 PM 06/06/2022    1:19 PM 05/28/2021    1:25 PM 01/28/2020    3:46 PM 09/04/2018    9:24 AM  PHQ 2/9 Scores  PHQ - 2 Score 4 0 0 6 6 1  0  PHQ- 9 Score 6  2 19 19       Fall Risk     12/23/2023    3:25 PM 12/04/2023   10:51 AM 07/24/2022    3:05 PM 07/12/2022    1:55 PM 06/06/2022    1:08 PM  Fall Risk   Falls in the past year? 1 0 1 1 1   Number falls in past yr: 1 0 0 1 1  Injury with Fall? 1 0 1 1 1   Risk for fall due to : Medication side effect;Impaired balance/gait;History of fall(s) Impaired mobility;Impaired balance/gait  History of fall(s) History of fall(s);Impaired balance/gait;Orthopedic patient  Follow up Falls evaluation completed;Falls prevention discussed Falls evaluation completed Falls  evaluation completed Falls evaluation completed Education provided;Falls prevention discussed    MEDICARE RISK AT HOME:  Medicare Risk at Home Any stairs in or around the home?: No Home free of loose throw rugs in walkways, pet beds, electrical cords, etc?: Yes Adequate lighting in your home to reduce risk of falls?: Yes Life alert?: No Use of a cane, walker or w/c?: Yes (rollator) Grab bars in the bathroom?: No Shower chair or bench in shower?: No Elevated toilet seat or a handicapped  toilet?: No  TIMED UP AND GO:  Was the test performed?  No  Cognitive Function: Declined/Normal: No cognitive concerns noted by patient or family. Patient alert, oriented, able to answer questions appropriately and recall recent events. No signs of memory loss or confusion.    08/29/2020    8:40 AM 12/03/2017   11:20 AM 06/04/2017   12:21 PM 12/27/2016    9:04 AM 07/25/2016    4:20 PM  MMSE - Mini Mental State Exam  Orientation to time 4 5 5 4 5   Orientation to Place 5 4 5 5 5   Registration 1 3 3 3 3   Attention/ Calculation 1 3 1 5 5   Recall 1 2 1 1 1   Language- name 2 objects 2 2 2 2 2   Language- repeat 1 1 1 1 1   Language- follow 3 step command 3 3 2 3 2   Language- read & follow direction 1 1 1 1 1   Write a sentence 1 1 1 1 1   Copy design 0 1 0 0 1  Total score 20 26 22 26 27         06/06/2022    1:26 PM  6CIT Screen  What Year? 0 points  What month? 0 points  What time? 0 points  Count back from 20 0 points  Months in reverse 0 points  Repeat phrase 0 points  Total Score 0 points    Immunizations Immunization History  Administered Date(s) Administered   Fluad Quad(high Dose 65+) 07/12/2022   Hep A / Hep B 02/05/2017   Hepatitis B, ADULT 03/05/2017, 08/14/2017   Influenza,inj,Quad PF,6+ Mos 05/27/2015, 05/30/2016, 06/13/2017, 05/05/2018, 05/22/2019, 05/03/2020, 06/05/2021   Influenza,inj,quad, With Preservative 04/26/2018, 05/24/2019   Influenza-Unspecified 06/26/2012,  05/26/2014   Moderna Sars-Covid-2 Vaccination 10/05/2019, 11/02/2019   PNEUMOCOCCAL CONJUGATE-20 07/17/2021   Pneumococcal Conjugate-13 10/23/2015   Pneumococcal Polysaccharide-23 07/10/2014    Screening Tests Health Maintenance  Topic Date Due   DTaP/Tdap/Td (1 - Tdap) Never done   Zoster Vaccines- Shingrix (1 of 2) Never done   Colonoscopy  11/25/2017   Lung Cancer Screening  03/31/2019   COVID-19 Vaccine (3 - Moderna risk series) 11/30/2019   DEXA SCAN  06/15/2021   INFLUENZA VACCINE  03/26/2024   HEMOGLOBIN A1C  05/21/2024   Diabetic kidney evaluation - Urine ACR  05/26/2024   Diabetic kidney evaluation - eGFR measurement  10/09/2024   FOOT EXAM  11/18/2024   OPHTHALMOLOGY EXAM  11/30/2024   Medicare Annual Wellness (AWV)  12/22/2024   MAMMOGRAM  10/13/2025   Pneumonia Vaccine 55+ Years old  Completed   Hepatitis C Screening  Completed   HPV VACCINES  Aged Out   Meningococcal B Vaccine  Aged Out    Health Maintenance  Health Maintenance Due  Topic Date Due   DTaP/Tdap/Td (1 - Tdap) Never done   Zoster Vaccines- Shingrix (1 of 2) Never done   Colonoscopy  11/25/2017   Lung Cancer Screening  03/31/2019   COVID-19 Vaccine (3 - Moderna risk series) 11/30/2019   DEXA SCAN  06/15/2021   Health Maintenance Items Addressed: See Nurse Notes  Additional Screening:  Vision Screening: Recommended annual ophthalmology exams for early detection of glaucoma and other disorders of the eye.  Dental Screening: Recommended annual dental exams for proper oral hygiene  Community Resource Referral / Chronic Care Management: CRR required this visit?  No   CCM required this visit?  No     Plan:     I have personally  reviewed and noted the following in the patient's chart:   Medical and social history Use of alcohol, tobacco or illicit drugs  Current medications and supplements including opioid prescriptions. Patient is currently taking opioid prescriptions. Information  provided to patient regarding non-opioid alternatives. Patient advised to discuss non-opioid treatment plan with their provider. Functional ability and status Nutritional status Physical activity Advanced directives List of other physicians Hospitalizations, surgeries, and ER visits in previous 12 months Vitals Screenings to include cognitive, depression, and falls Referrals and appointments  In addition, I have reviewed and discussed with patient certain preventive protocols, quality metrics, and best practice recommendations. A written personalized care plan for preventive services as well as general preventive health recommendations were provided to patient.     Mary Acosta, CMA   12/23/2023   After Visit Summary: (MyChart) Due to this being a telephonic visit, the after visit summary with patients personalized plan was offered to patient via MyChart   Notes: Please refer to Routing Comments.

## 2023-12-23 NOTE — Telephone Encounter (Unsigned)
 Copied from CRM [id number]. Topic: Referral - Request for Referral >> Dec 23, 2023 11:31 AM Allyne Areola wrote: Did the patient discuss referral with their provider in the last year? Yes (If No - schedule appointment) (If Yes - send message)  Appointment offered? Yes  Type of order/referral and detailed reason for visit: neurology  Preference of office, provider, location: [facility]  If referral order, have you been seen by this specialty before? Yes (If Yes, this issue or another issue? When? Where?  Can we respond through MyChart? Yes

## 2023-12-24 ENCOUNTER — Other Ambulatory Visit: Payer: Self-pay | Admitting: Internal Medicine

## 2023-12-24 DIAGNOSIS — Z1211 Encounter for screening for malignant neoplasm of colon: Secondary | ICD-10-CM

## 2023-12-24 DIAGNOSIS — Z13820 Encounter for screening for osteoporosis: Secondary | ICD-10-CM

## 2023-12-24 DIAGNOSIS — Z780 Asymptomatic menopausal state: Secondary | ICD-10-CM

## 2023-12-24 DIAGNOSIS — Z0000 Encounter for general adult medical examination without abnormal findings: Secondary | ICD-10-CM

## 2023-12-24 DIAGNOSIS — Z122 Encounter for screening for malignant neoplasm of respiratory organs: Secondary | ICD-10-CM

## 2023-12-24 NOTE — Telephone Encounter (Signed)
 Patient is needing a call back regarding her referral

## 2023-12-24 NOTE — Telephone Encounter (Signed)
 Referral ordered

## 2023-12-24 NOTE — Telephone Encounter (Signed)
 Can you find out what she wanted to see them for

## 2023-12-24 NOTE — Telephone Encounter (Signed)
 Message left for husband today to return call to clinic with reason for referral.

## 2023-12-25 NOTE — Telephone Encounter (Signed)
 Robin,    Please check because I think this is a duplicate prescription.  I sent in a Rx recently.  - H. Dominic Friendly, MD

## 2023-12-26 DIAGNOSIS — M353 Polymyalgia rheumatica: Secondary | ICD-10-CM | POA: Diagnosis not present

## 2023-12-26 DIAGNOSIS — Z794 Long term (current) use of insulin: Secondary | ICD-10-CM | POA: Diagnosis not present

## 2023-12-26 DIAGNOSIS — G894 Chronic pain syndrome: Secondary | ICD-10-CM | POA: Diagnosis not present

## 2023-12-26 DIAGNOSIS — Z882 Allergy status to sulfonamides status: Secondary | ICD-10-CM | POA: Diagnosis not present

## 2023-12-26 DIAGNOSIS — Z791 Long term (current) use of non-steroidal anti-inflammatories (NSAID): Secondary | ICD-10-CM | POA: Diagnosis not present

## 2023-12-26 DIAGNOSIS — Z79899 Other long term (current) drug therapy: Secondary | ICD-10-CM | POA: Diagnosis not present

## 2023-12-26 DIAGNOSIS — Z885 Allergy status to narcotic agent status: Secondary | ICD-10-CM | POA: Diagnosis not present

## 2023-12-26 DIAGNOSIS — Z7985 Long-term (current) use of injectable non-insulin antidiabetic drugs: Secondary | ICD-10-CM | POA: Diagnosis not present

## 2023-12-26 DIAGNOSIS — Z7984 Long term (current) use of oral hypoglycemic drugs: Secondary | ICD-10-CM | POA: Diagnosis not present

## 2023-12-26 DIAGNOSIS — Z79891 Long term (current) use of opiate analgesic: Secondary | ICD-10-CM | POA: Diagnosis not present

## 2023-12-26 DIAGNOSIS — E1142 Type 2 diabetes mellitus with diabetic polyneuropathy: Secondary | ICD-10-CM | POA: Diagnosis not present

## 2023-12-26 DIAGNOSIS — Z79890 Hormone replacement therapy (postmenopausal): Secondary | ICD-10-CM | POA: Diagnosis not present

## 2023-12-26 DIAGNOSIS — M25562 Pain in left knee: Secondary | ICD-10-CM | POA: Diagnosis not present

## 2023-12-26 DIAGNOSIS — M533 Sacrococcygeal disorders, not elsewhere classified: Secondary | ICD-10-CM | POA: Diagnosis not present

## 2023-12-27 DIAGNOSIS — M170 Bilateral primary osteoarthritis of knee: Secondary | ICD-10-CM | POA: Diagnosis not present

## 2023-12-27 DIAGNOSIS — M1712 Unilateral primary osteoarthritis, left knee: Secondary | ICD-10-CM | POA: Diagnosis not present

## 2023-12-27 DIAGNOSIS — M1711 Unilateral primary osteoarthritis, right knee: Secondary | ICD-10-CM | POA: Diagnosis not present

## 2023-12-28 ENCOUNTER — Other Ambulatory Visit: Payer: Self-pay | Admitting: Internal Medicine

## 2024-01-01 DIAGNOSIS — H2513 Age-related nuclear cataract, bilateral: Secondary | ICD-10-CM | POA: Diagnosis not present

## 2024-01-01 DIAGNOSIS — H04123 Dry eye syndrome of bilateral lacrimal glands: Secondary | ICD-10-CM | POA: Diagnosis not present

## 2024-01-02 DIAGNOSIS — F1911 Other psychoactive substance abuse, in remission: Secondary | ICD-10-CM | POA: Diagnosis not present

## 2024-01-02 DIAGNOSIS — F4323 Adjustment disorder with mixed anxiety and depressed mood: Secondary | ICD-10-CM | POA: Diagnosis not present

## 2024-01-02 DIAGNOSIS — F418 Other specified anxiety disorders: Secondary | ICD-10-CM | POA: Diagnosis not present

## 2024-01-02 DIAGNOSIS — F332 Major depressive disorder, recurrent severe without psychotic features: Secondary | ICD-10-CM | POA: Diagnosis not present

## 2024-01-07 ENCOUNTER — Telehealth: Payer: Self-pay | Admitting: Acute Care

## 2024-01-07 DIAGNOSIS — Z122 Encounter for screening for malignant neoplasm of respiratory organs: Secondary | ICD-10-CM

## 2024-01-07 DIAGNOSIS — Z87891 Personal history of nicotine dependence: Secondary | ICD-10-CM

## 2024-01-07 NOTE — Telephone Encounter (Signed)
 Lung Cancer Screening Narrative/Criteria Questionnaire (Cigarette Smokers Only- No Cigars/Pipes/vapes)   Mary Acosta   SDMV:02/09/2024 11:45 [name]        1955-09-01   LDCT: 02/10/2024 11:20 GI    68 y.o.   Phone: [phone]  Lung Screening Narrative (confirm age 33-77 yrs Medicare / 50-80 yrs Private pay insurance)   Insurance information:[employer]na   Referring Provider:Dr. Donnette Gal   This screening involves an initial phone call with a team member from our program. It is called a shared decision making visit. The initial meeting is required by  insurance and Medicare to make sure you understand the program. This appointment takes about 15-20 minutes to complete. You will complete the screening scan at your scheduled date/time.  This scan takes about 5-10 minutes to complete. You can eat and drink normally before and after the scan.  Criteria questions for Lung Cancer Screening:   Are you a current or former smoker? Former Age began smoking: 68yo   If you are a former smoker, what year did you quit smoking? 2012 (within 15 yrs)   To calculate your smoking history, I need an accurate estimate of how many packs of cigarettes you smoked per day and for how many years. (Not just the number of PPD you are now smoking)   Years smoking 38 x Packs per day 2 = Pack years 7   (at least 20 pack yrs)   (Make sure they understand that we need to know how much they have smoked in the past, not just the number of PPD they are smoking now)  Do you have a personal history of cancer?  Yes - (type and when diagnosed - 5 yrs cancer free) thyroid  - dx in 2007, thyroidectomy, no treatment following    Do you have a family history of cancer? Yes  (cancer type and and relative) mother - unsure  Are you coughing up blood?  No  Have you had unexplained weight loss of 15 lbs or more in the last 6 months? No  It looks like you meet all criteria.  When would be a good time for us  to schedule you for this  screening?   Additional information: N/A

## 2024-01-09 ENCOUNTER — Ambulatory Visit: Payer: Self-pay

## 2024-01-09 NOTE — Telephone Encounter (Signed)
 Chief Complaint: breast pain Symptoms: pain Frequency: couple weeks  Pertinent Negatives: Patient denies fever, vomiting, redness, rash, drainage, heat  Disposition: [] ED /[] Urgent Care (no appt availability in office) / [x] Appointment(In office/virtual)/ []  [facility]/ [] Home Care/ [] Refused Recommended Disposition /[] [facility]/ []  Follow-up with PCP Additional Notes: Pt reports pain to the side of her R breast near her armpit. Pt states she had a radial scar removed from her L breast years ago. Pt states the pain is a 6/10 when she pushes against the area. Pt denies redness, rash, heat, signs of infection, fever, nipple drainage, vomiting. Pt states she takes pain medication for her chronic pain which helps. RN scheduled pt for Tuesday. RN advised pt if she develops worsening and has severe pain, redness, rash, heat, drainage, fever, or vomiting to go to the ED. Pt verbalized understanding.   Red Word that prompted transfer to Nurse Triage: Patient reports experiencing right breast pain, stating that the area is painful when pressed but there are no visible abnormalities on the skin. She is wondering if a US  can ordered  by Oma Bias. Reason for Disposition  [1] Breast pain AND [2] cause is not known  Answer Assessment - Initial Assessment Questions 1. SYMPTOM: "What's the main symptom you're concerned about?"  (e.g., lump, pain, rash, nipple discharge)     R breast pain 2. LOCATION: "Where is the pain located?"     On the side of the breast, close to her armpit 3. ONSET: "When did pain start?"     Couple weeks  4. PRIOR HISTORY: "Do you have any history of prior problems with your breasts?" (e.g., lumps, cancer, fibrocystic breast disease)     "When they took my thyroid  out it was a carcinoma, I had to have an ablation" 5. CAUSE: "What do you think is causing this symptom?"     Not sure  6. OTHER SYMPTOMS: "Do you have any other symptoms?" (e.g., fever, breast  pain, redness or rash, nipple discharge) Denies rash, redness, heat. "Nothing on top of the skin." Denies fever or chills. Denies discharge. "I did get nauseated the other day but I don't know that had anything to do with it." States the breast hurts all the time. Worse when she presses on it. 6/10 pain when she presses it. States she takes pain medication       Had a mammogram a few months ago. Had a radial scar scar cut out of her L breast "a long time ago."  Protocols used: Breast Symptoms-A-AH

## 2024-01-10 DIAGNOSIS — J449 Chronic obstructive pulmonary disease, unspecified: Secondary | ICD-10-CM | POA: Diagnosis not present

## 2024-01-13 ENCOUNTER — Ambulatory Visit (INDEPENDENT_AMBULATORY_CARE_PROVIDER_SITE_OTHER): Admitting: Internal Medicine

## 2024-01-13 ENCOUNTER — Encounter: Payer: Self-pay | Admitting: Internal Medicine

## 2024-01-13 ENCOUNTER — Telehealth: Payer: Self-pay | Admitting: Internal Medicine

## 2024-01-13 VITALS — BP 136/70 | HR 79 | Temp 98.7°F | Ht 67.5 in | Wt 300.0 lb

## 2024-01-13 DIAGNOSIS — N644 Mastodynia: Secondary | ICD-10-CM | POA: Insufficient documentation

## 2024-01-13 DIAGNOSIS — I10 Essential (primary) hypertension: Secondary | ICD-10-CM

## 2024-01-13 NOTE — Telephone Encounter (Signed)
 Requisitions printed out and faxed today.

## 2024-01-13 NOTE — Progress Notes (Signed)
 Subjective:    Patient ID: Mary Acosta, female    DOB: 08/20/1956, 68 y.o.   MRN: [id number]      HPI Mary Acosta is here for  Chief Complaint  Patient presents with   Breast Pain    Right sided breast pain x 2 weeks    10/14/23 - last mammo - density cat a.  negative  Her right lateral breast has been hurting for 2 weeks.    Medications and allergies reviewed with patient and updated if appropriate.  Current Outpatient Medications on File Prior to Visit  Medication Sig Dispense Refill   Adalimumab (HUMIRA PEN) 40 MG/0.4ML PNKT Inject 40 mg into the skin every 14 (fourteen) days. Every other Thursday     albuterol  (VENTOLIN  HFA) 108 (90 Base) MCG/ACT inhaler INHALE TWO (2) PUFFS BY MOUTH EVERY 6 HOURS AS NEEDED FOR SHORTNESS OF BREATH AND WHEEZING 18 g 10   Albuterol  Sulfate (PROAIR  RESPICLICK) 108 (90 Base) MCG/ACT AEPB 1 puff as needed Inhalation every 4 hrs     amLODipine  (NORVASC ) 5 MG tablet TAKE 1 TABLET BY MOUTH EVERY MORNING AND EVERY DAY AT BEDTIME 60 tablet 10   atomoxetine (STRATTERA) 40 MG capsule Take 40 mg by mouth daily.     baclofen (LIORESAL) 10 MG tablet Take 10 mg by mouth.     Buprenorphine HCl 300 MCG FILM Place 1 Film inside cheek 2 (two) times daily.     busPIRone  (BUSPAR ) 15 MG tablet Take 15 mg by mouth 2 (two) times daily.     celecoxib  (CELEBREX ) 100 MG capsule TAKE 1 CAPSULE BY MOUTH TWICE A DAY 60 capsule 0   clonazePAM  (KLONOPIN ) 0.5 MG tablet Take 0.5 mg by mouth daily as needed.     cloNIDine (CATAPRES) 0.1 MG tablet Take 0.1 mg by mouth every 8 (eight) hours as needed.     colchicine  0.6 MG tablet Take 1 tablet (0.6 mg total) by mouth daily. 30 tablet 5   Continuous Blood Gluc Sensor (FREESTYLE LIBRE 3 SENSOR) MISC 1 each by Does not apply route every 14 (fourteen) days. 6 each 3   Continuous Glucose Receiver (DEXCOM G7 RECEIVER) DEVI See admin instructions.     Continuous Glucose Sensor (DEXCOM G7 SENSOR) MISC 3 each by Does not apply  route every 30 (thirty) days. Apply 1 sensor every 10 days 9 each 4   ergocalciferol  (VITAMIN D2) 1.25 MG (50000 UT) capsule Take 1 capsule (50,000 Units total) by mouth once a week. Monday 12 capsule 3   fexofenadine (ALLEGRA) 180 MG tablet Take 180 mg by mouth daily as needed for allergies or rhinitis.     folic acid (FOLVITE) 1 MG tablet Take 1 mg by mouth daily.     furosemide  (LASIX ) 40 MG tablet TAKE 1 TABLET BY MOUTH EACH MORNING 30 tablet 10   gabapentin  (NEURONTIN ) 300 MG capsule Take 300 mg by mouth 3 (three) times daily as needed (pain).     hydrALAZINE  (APRESOLINE ) 25 MG tablet TAKE ONE TABLET BY MOUTH AT BREAKFAST AND AT BEDTIME May take extra tab if needed if bp over 170/80 225 tablet 3   hydrOXYzine  (VISTARIL ) 50 MG capsule Take 50 mg by mouth 2 (two) times daily as needed for itching or anxiety.     insulin  glargine, 1 Unit Dial, (TOUJEO  SOLOSTAR) 300 UNIT/ML Solostar Pen Inject 80 Units into the skin daily. 9 mL 3   Insulin  Pen Needle (BD PEN NEEDLE NANO U/F) 32G X 4 MM  MISC USE AS DIRECTED FIVE TIMES A DAY 100 each 10   Insulin  Syringe-Needle U-100 26G X 1/2" 1 ML MISC Use daily for insulin  injection as directed 100 each 3   Ipratropium-Albuterol  (COMBIVENT  RESPIMAT) 20-100 MCG/ACT AERS respimat Inhale 1 puff into the lungs every 6 (six) hours as needed for wheezing. 4 g 5   ipratropium-albuterol  (DUONEB) 0.5-2.5 (3) MG/3ML SOLN Take 3 mLs by nebulization every 6 (six) hours as needed (Wheezing or dyspnea.). 360 mL 11   levothyroxine  (SYNTHROID ) 175 MCG tablet TAKE 1 TABLET BY MOUTH ONCE DAILY BEFORE BREAKFAST 30 tablet 10   lidocaine  (LIDODERM ) 5 % Place 1 patch onto the skin.     metFORMIN  (GLUCOPHAGE ) 500 MG tablet TAKE 1 TABLET BY MOUTH EACH MORNING AND TAKE 1 TABLET EVERY DAY AT BEDTIME 180 tablet 3   metoprolol  succinate (TOPROL -XL) 25 MG 24 hr tablet TAKE 1 TABLET BY MOUTH EVERY DAY AT BEDTIME WITH OR IMMEDIATLY FOLLOWING A MEAL 30 tablet 4   naloxone  (NARCAN ) nasal spray 4  mg/0.1 mL One spray in either nostril once for known/suspected opioid overdose. May repeat every 2-3 minutes in alternating nostril til EMS arrives     OXYGEN  Inhale 3 L into the lungs at bedtime.     PARoxetine  (PAXIL ) 20 MG tablet Take 20 mg by mouth at bedtime.     PARoxetine  (PAXIL ) 40 MG tablet Take 1 tablet (40 mg total) by mouth daily. 90 tablet 1   potassium chloride  SA (KLOR-CON  M20) 20 MEQ tablet Take 2 tab daily 180 tablet 3   promethazine  (PHENERGAN ) 25 MG tablet TAKE 1/2 TABLET BY MOUTH EVERY 8 HOURS AS NEEDED FOR NAUSEA AND VOMITING 30 tablet 2   promethazine  (PHENERGAN ) 25 MG tablet Take 1 tablet (25 mg total) by mouth every 6 (six) hours as needed for nausea or vomiting. 60 tablet 1   Propylene Glycol (SYSTANE BALANCE) 0.6 % SOLN Place 1 drop into both eyes daily as needed (dry eyes).     Semaglutide , 1 MG/DOSE, (OZEMPIC , 1 MG/DOSE,) 4 MG/3ML SOPN INJECT 1MG  INTO THE SKIN ONCE A WEEK 9 mL 3   simvastatin  (ZOCOR ) 20 MG tablet TAKE 1 TABLET BY MOUTH EVERY DAY AT BEDTIME 90 tablet 1   tiZANidine  (ZANAFLEX ) 4 MG tablet Take 4 mg by mouth every 8 (eight) hours as needed for muscle spasms.     TRESIBA  FLEXTOUCH 200 UNIT/ML FlexTouch Pen SMARTSIG:80 Unit(s) SUB-Q Daily     No current facility-administered medications on file prior to visit.    Review of Systems     Objective:   Vitals:   01/13/24 1332  BP: 136/70  Pulse: 79  Temp: 98.7 F (37.1 C)  SpO2: 92%   BP Readings from Last 3 Encounters:  01/13/24 136/70  12/04/23 (!) 140/78  11/19/23 130/70   Wt Readings from Last 3 Encounters:  01/13/24 300 lb (136.1 kg)  12/23/23 300 lb (136.1 kg)  12/04/23 300 lb (136.1 kg)   Body mass index is 46.29 kg/m.    Physical Exam Chest:  Breasts:    Right: Mass (glandular tissue felt - ? lump) and tenderness (right lateral breast 9 o'clock - closest to axilla) present. No swelling, inverted nipple, nipple discharge or skin change.            Assessment & Plan:     See Problem List for Assessment and Plan of chronic medical problems.

## 2024-01-13 NOTE — Assessment & Plan Note (Signed)
 Chronic Blood pressure controlled Continue amlodipine  5 mg daily, hydralazine  25 mg twice daily, metoprolol  xl 25 mg daily

## 2024-01-13 NOTE — Telephone Encounter (Signed)
 Copied from CRM [id number]. Topic: General - Other >> Jan 13, 2024  3:11 PM Freya Jesus wrote: Reason for CRM: Avis Lemming from [facility] and said someone put in a breast ultrasound and they need it co-signed by 11am because her appointment is at 1pm. Call back: [phone] Fax: [phone]

## 2024-01-13 NOTE — Assessment & Plan Note (Signed)
 Acute 2 weeks right lateral breast pain - 9 o'clock close to lateral chest wall x 2 weeks  Area of tenderness there is glandular tissue - ? Lump Normal appearance of breast Last mammo 09/2023 - normal Diagnostic mammo, breast US  ordered

## 2024-01-13 NOTE — Patient Instructions (Signed)
     A diagnostic mammogram was ordered.  An ultrasound of your breast was ordered and they will do this if needed.

## 2024-01-14 ENCOUNTER — Ambulatory Visit
Admission: RE | Admit: 2024-01-14 | Discharge: 2024-01-14 | Disposition: A | Discharge status: Home / Self Care | Source: Ambulatory Visit | Attending: Internal Medicine | Admitting: Internal Medicine

## 2024-01-14 DIAGNOSIS — N644 Mastodynia: Secondary | ICD-10-CM

## 2024-01-15 DIAGNOSIS — H04123 Dry eye syndrome of bilateral lacrimal glands: Secondary | ICD-10-CM | POA: Diagnosis not present

## 2024-02-02 ENCOUNTER — Other Ambulatory Visit: Payer: Self-pay

## 2024-02-02 ENCOUNTER — Emergency Department (HOSPITAL_COMMUNITY)

## 2024-02-02 ENCOUNTER — Inpatient Hospital Stay (HOSPITAL_COMMUNITY)
Admission: EM | Admit: 2024-02-02 | Discharge: 2024-02-08 | DRG: 208 | Disposition: A | Discharge status: Home Health | Attending: Internal Medicine | Admitting: Internal Medicine

## 2024-02-02 ENCOUNTER — Encounter (HOSPITAL_COMMUNITY): Payer: Self-pay

## 2024-02-02 DIAGNOSIS — J441 Chronic obstructive pulmonary disease with (acute) exacerbation: Secondary | ICD-10-CM | POA: Diagnosis present

## 2024-02-02 DIAGNOSIS — Z1152 Encounter for screening for COVID-19: Secondary | ICD-10-CM

## 2024-02-02 DIAGNOSIS — Z794 Long term (current) use of insulin: Secondary | ICD-10-CM | POA: Diagnosis not present

## 2024-02-02 DIAGNOSIS — M19071 Primary osteoarthritis, right ankle and foot: Secondary | ICD-10-CM | POA: Diagnosis present

## 2024-02-02 DIAGNOSIS — Z8619 Personal history of other infectious and parasitic diseases: Secondary | ICD-10-CM

## 2024-02-02 DIAGNOSIS — J189 Pneumonia, unspecified organism: Secondary | ICD-10-CM | POA: Diagnosis present

## 2024-02-02 DIAGNOSIS — E1165 Type 2 diabetes mellitus with hyperglycemia: Secondary | ICD-10-CM | POA: Diagnosis present

## 2024-02-02 DIAGNOSIS — J440 Chronic obstructive pulmonary disease with acute lower respiratory infection: Secondary | ICD-10-CM | POA: Diagnosis not present

## 2024-02-02 DIAGNOSIS — R4182 Altered mental status, unspecified: Secondary | ICD-10-CM | POA: Diagnosis not present

## 2024-02-02 DIAGNOSIS — Z882 Allergy status to sulfonamides status: Secondary | ICD-10-CM

## 2024-02-02 DIAGNOSIS — Z888 Allergy status to other drugs, medicaments and biological substances status: Secondary | ICD-10-CM

## 2024-02-02 DIAGNOSIS — Z85850 Personal history of malignant neoplasm of thyroid: Secondary | ICD-10-CM

## 2024-02-02 DIAGNOSIS — T50915A Adverse effect of multiple unspecified drugs, medicaments and biological substances, initial encounter: Secondary | ICD-10-CM | POA: Diagnosis not present

## 2024-02-02 DIAGNOSIS — M19041 Primary osteoarthritis, right hand: Secondary | ICD-10-CM | POA: Diagnosis present

## 2024-02-02 DIAGNOSIS — Z818 Family history of other mental and behavioral disorders: Secondary | ICD-10-CM

## 2024-02-02 DIAGNOSIS — E890 Postprocedural hypothyroidism: Secondary | ICD-10-CM | POA: Diagnosis present

## 2024-02-02 DIAGNOSIS — Z7984 Long term (current) use of oral hypoglycemic drugs: Secondary | ICD-10-CM

## 2024-02-02 DIAGNOSIS — E1140 Type 2 diabetes mellitus with diabetic neuropathy, unspecified: Secondary | ICD-10-CM | POA: Diagnosis present

## 2024-02-02 DIAGNOSIS — J9602 Acute respiratory failure with hypercapnia: Secondary | ICD-10-CM | POA: Diagnosis not present

## 2024-02-02 DIAGNOSIS — I129 Hypertensive chronic kidney disease with stage 1 through stage 4 chronic kidney disease, or unspecified chronic kidney disease: Secondary | ICD-10-CM | POA: Diagnosis present

## 2024-02-02 DIAGNOSIS — Z96652 Presence of left artificial knee joint: Secondary | ICD-10-CM | POA: Diagnosis present

## 2024-02-02 DIAGNOSIS — G894 Chronic pain syndrome: Secondary | ICD-10-CM | POA: Diagnosis present

## 2024-02-02 DIAGNOSIS — M419 Scoliosis, unspecified: Secondary | ICD-10-CM | POA: Diagnosis present

## 2024-02-02 DIAGNOSIS — E039 Hypothyroidism, unspecified: Secondary | ICD-10-CM | POA: Diagnosis present

## 2024-02-02 DIAGNOSIS — Z9981 Dependence on supplemental oxygen: Secondary | ICD-10-CM

## 2024-02-02 DIAGNOSIS — M545 Low back pain, unspecified: Secondary | ICD-10-CM | POA: Diagnosis present

## 2024-02-02 DIAGNOSIS — Z7985 Long-term (current) use of injectable non-insulin antidiabetic drugs: Secondary | ICD-10-CM

## 2024-02-02 DIAGNOSIS — G928 Other toxic encephalopathy: Secondary | ICD-10-CM | POA: Diagnosis not present

## 2024-02-02 DIAGNOSIS — I5032 Chronic diastolic (congestive) heart failure: Secondary | ICD-10-CM | POA: Diagnosis present

## 2024-02-02 DIAGNOSIS — R0989 Other specified symptoms and signs involving the circulatory and respiratory systems: Secondary | ICD-10-CM | POA: Diagnosis not present

## 2024-02-02 DIAGNOSIS — J9621 Acute and chronic respiratory failure with hypoxia: Secondary | ICD-10-CM | POA: Diagnosis not present

## 2024-02-02 DIAGNOSIS — Z8673 Personal history of transient ischemic attack (TIA), and cerebral infarction without residual deficits: Secondary | ICD-10-CM

## 2024-02-02 DIAGNOSIS — E872 Acidosis, unspecified: Secondary | ICD-10-CM | POA: Diagnosis not present

## 2024-02-02 DIAGNOSIS — E662 Morbid (severe) obesity with alveolar hypoventilation: Secondary | ICD-10-CM | POA: Diagnosis not present

## 2024-02-02 DIAGNOSIS — F32A Depression, unspecified: Secondary | ICD-10-CM | POA: Diagnosis present

## 2024-02-02 DIAGNOSIS — Z885 Allergy status to narcotic agent status: Secondary | ICD-10-CM

## 2024-02-02 DIAGNOSIS — R509 Fever, unspecified: Secondary | ICD-10-CM | POA: Diagnosis not present

## 2024-02-02 DIAGNOSIS — M19042 Primary osteoarthritis, left hand: Secondary | ICD-10-CM | POA: Diagnosis present

## 2024-02-02 DIAGNOSIS — M797 Fibromyalgia: Secondary | ICD-10-CM | POA: Diagnosis present

## 2024-02-02 DIAGNOSIS — R12 Heartburn: Secondary | ICD-10-CM | POA: Diagnosis not present

## 2024-02-02 DIAGNOSIS — H3530 Unspecified macular degeneration: Secondary | ICD-10-CM | POA: Diagnosis present

## 2024-02-02 DIAGNOSIS — Z6841 Body Mass Index (BMI) 40.0 and over, adult: Secondary | ICD-10-CM

## 2024-02-02 DIAGNOSIS — Z87891 Personal history of nicotine dependence: Secondary | ICD-10-CM

## 2024-02-02 DIAGNOSIS — J324 Chronic pansinusitis: Secondary | ICD-10-CM | POA: Diagnosis not present

## 2024-02-02 DIAGNOSIS — J9601 Acute respiratory failure with hypoxia: Principal | ICD-10-CM | POA: Diagnosis present

## 2024-02-02 DIAGNOSIS — I10 Essential (primary) hypertension: Secondary | ICD-10-CM | POA: Diagnosis not present

## 2024-02-02 DIAGNOSIS — E1159 Type 2 diabetes mellitus with other circulatory complications: Secondary | ICD-10-CM | POA: Diagnosis present

## 2024-02-02 DIAGNOSIS — E1122 Type 2 diabetes mellitus with diabetic chronic kidney disease: Secondary | ICD-10-CM | POA: Diagnosis present

## 2024-02-02 DIAGNOSIS — G9340 Encephalopathy, unspecified: Secondary | ICD-10-CM | POA: Diagnosis not present

## 2024-02-02 DIAGNOSIS — K7581 Nonalcoholic steatohepatitis (NASH): Secondary | ICD-10-CM | POA: Diagnosis not present

## 2024-02-02 DIAGNOSIS — E785 Hyperlipidemia, unspecified: Secondary | ICD-10-CM | POA: Diagnosis present

## 2024-02-02 DIAGNOSIS — M19072 Primary osteoarthritis, left ankle and foot: Secondary | ICD-10-CM | POA: Diagnosis present

## 2024-02-02 DIAGNOSIS — M479 Spondylosis, unspecified: Secondary | ICD-10-CM | POA: Diagnosis present

## 2024-02-02 DIAGNOSIS — R578 Other shock: Secondary | ICD-10-CM | POA: Diagnosis not present

## 2024-02-02 DIAGNOSIS — D72829 Elevated white blood cell count, unspecified: Secondary | ICD-10-CM

## 2024-02-02 DIAGNOSIS — G9349 Other encephalopathy: Secondary | ICD-10-CM | POA: Diagnosis present

## 2024-02-02 DIAGNOSIS — Z833 Family history of diabetes mellitus: Secondary | ICD-10-CM

## 2024-02-02 DIAGNOSIS — Z791 Long term (current) use of non-steroidal anti-inflammatories (NSAID): Secondary | ICD-10-CM

## 2024-02-02 DIAGNOSIS — M19012 Primary osteoarthritis, left shoulder: Secondary | ICD-10-CM | POA: Diagnosis present

## 2024-02-02 DIAGNOSIS — R140 Abdominal distension (gaseous): Secondary | ICD-10-CM | POA: Diagnosis not present

## 2024-02-02 DIAGNOSIS — E669 Obesity, unspecified: Secondary | ICD-10-CM | POA: Diagnosis present

## 2024-02-02 DIAGNOSIS — N1831 Chronic kidney disease, stage 3a: Secondary | ICD-10-CM | POA: Diagnosis not present

## 2024-02-02 DIAGNOSIS — R55 Syncope and collapse: Secondary | ICD-10-CM | POA: Diagnosis not present

## 2024-02-02 DIAGNOSIS — Z4682 Encounter for fitting and adjustment of non-vascular catheter: Secondary | ICD-10-CM | POA: Diagnosis not present

## 2024-02-02 DIAGNOSIS — R4020 Unspecified coma: Secondary | ICD-10-CM | POA: Diagnosis not present

## 2024-02-02 DIAGNOSIS — Z79890 Hormone replacement therapy (postmenopausal): Secondary | ICD-10-CM

## 2024-02-02 DIAGNOSIS — Z8541 Personal history of malignant neoplasm of cervix uteri: Secondary | ICD-10-CM

## 2024-02-02 DIAGNOSIS — F419 Anxiety disorder, unspecified: Secondary | ICD-10-CM | POA: Diagnosis present

## 2024-02-02 DIAGNOSIS — Z9884 Bariatric surgery status: Secondary | ICD-10-CM

## 2024-02-02 DIAGNOSIS — M353 Polymyalgia rheumatica: Secondary | ICD-10-CM | POA: Diagnosis not present

## 2024-02-02 DIAGNOSIS — K7460 Unspecified cirrhosis of liver: Secondary | ICD-10-CM | POA: Diagnosis not present

## 2024-02-02 DIAGNOSIS — K760 Fatty (change of) liver, not elsewhere classified: Secondary | ICD-10-CM | POA: Diagnosis present

## 2024-02-02 DIAGNOSIS — J9622 Acute and chronic respiratory failure with hypercapnia: Secondary | ICD-10-CM | POA: Diagnosis not present

## 2024-02-02 DIAGNOSIS — T4275XA Adverse effect of unspecified antiepileptic and sedative-hypnotic drugs, initial encounter: Secondary | ICD-10-CM | POA: Diagnosis not present

## 2024-02-02 DIAGNOSIS — E538 Deficiency of other specified B group vitamins: Secondary | ICD-10-CM | POA: Diagnosis present

## 2024-02-02 DIAGNOSIS — R0600 Dyspnea, unspecified: Secondary | ICD-10-CM | POA: Diagnosis not present

## 2024-02-02 DIAGNOSIS — G319 Degenerative disease of nervous system, unspecified: Secondary | ICD-10-CM | POA: Diagnosis not present

## 2024-02-02 DIAGNOSIS — G4700 Insomnia, unspecified: Secondary | ICD-10-CM | POA: Diagnosis present

## 2024-02-02 DIAGNOSIS — R918 Other nonspecific abnormal finding of lung field: Secondary | ICD-10-CM | POA: Diagnosis not present

## 2024-02-02 DIAGNOSIS — E119 Type 2 diabetes mellitus without complications: Secondary | ICD-10-CM | POA: Diagnosis not present

## 2024-02-02 DIAGNOSIS — F410 Panic disorder [episodic paroxysmal anxiety] without agoraphobia: Secondary | ICD-10-CM | POA: Diagnosis present

## 2024-02-02 DIAGNOSIS — Z8249 Family history of ischemic heart disease and other diseases of the circulatory system: Secondary | ICD-10-CM

## 2024-02-02 DIAGNOSIS — M19011 Primary osteoarthritis, right shoulder: Secondary | ICD-10-CM | POA: Diagnosis present

## 2024-02-02 DIAGNOSIS — G4733 Obstructive sleep apnea (adult) (pediatric): Secondary | ICD-10-CM | POA: Diagnosis not present

## 2024-02-02 DIAGNOSIS — Z79899 Other long term (current) drug therapy: Secondary | ICD-10-CM

## 2024-02-02 DIAGNOSIS — Z452 Encounter for adjustment and management of vascular access device: Secondary | ICD-10-CM | POA: Diagnosis not present

## 2024-02-02 DIAGNOSIS — Z79891 Long term (current) use of opiate analgesic: Secondary | ICD-10-CM

## 2024-02-02 DIAGNOSIS — R9431 Abnormal electrocardiogram [ECG] [EKG]: Secondary | ICD-10-CM | POA: Diagnosis not present

## 2024-02-02 LAB — URINALYSIS, ROUTINE W REFLEX MICROSCOPIC
Bacteria, UA: NONE SEEN
Bilirubin Urine: NEGATIVE
Bilirubin Urine: NEGATIVE
Glucose, UA: NEGATIVE mg/dL
Glucose, UA: NEGATIVE mg/dL
Hgb urine dipstick: NEGATIVE
Hgb urine dipstick: NEGATIVE
Ketones, ur: NEGATIVE mg/dL
Ketones, ur: 5 mg/dL — AB
Leukocytes,Ua: NEGATIVE
Nitrite: POSITIVE — AB
Nitrite: POSITIVE — AB
Protein, ur: NEGATIVE mg/dL
Protein, ur: 30 mg/dL — AB
Specific Gravity, Urine: 1.005 (ref 1.005–1.030)
Specific Gravity, Urine: 1.012 (ref 1.005–1.030)
pH: 6 (ref 5.0–8.0)
pH: 5 (ref 5.0–8.0)

## 2024-02-02 LAB — I-STAT ARTERIAL BLOOD GAS, ED
Acid-Base Excess: 0 mmol/L (ref 0.0–2.0)
Bicarbonate: 27.7 mmol/L (ref 20.0–28.0)
Calcium, Ion: 1.29 mmol/L (ref 1.15–1.40)
HCT: 46 % (ref 36.0–46.0)
Hemoglobin: 15.6 g/dL — ABNORMAL HIGH (ref 12.0–15.0)
O2 Saturation: 100 %
Patient temperature: 99
Potassium: 4 mmol/L (ref 3.5–5.1)
Sodium: 140 mmol/L (ref 135–145)
TCO2: 29 mmol/L (ref 22–32)
pCO2 arterial: 56.8 mmHg — ABNORMAL HIGH (ref 32–48)
pH, Arterial: 7.297 — ABNORMAL LOW (ref 7.35–7.45)
pO2, Arterial: 343 mmHg — ABNORMAL HIGH (ref 83–108)

## 2024-02-02 LAB — CBC WITH DIFFERENTIAL/PLATELET
Abs Immature Granulocytes: 0.05 10*3/uL (ref 0.00–0.07)
Basophils Absolute: 0.1 10*3/uL (ref 0.0–0.1)
Basophils Relative: 1 %
Eosinophils Absolute: 0.2 10*3/uL (ref 0.0–0.5)
Eosinophils Relative: 2 %
HCT: 51.4 % — ABNORMAL HIGH (ref 36.0–46.0)
Hemoglobin: 15.8 g/dL — ABNORMAL HIGH (ref 12.0–15.0)
Immature Granulocytes: 0 %
Lymphocytes Relative: 14 %
Lymphs Abs: 1.8 10*3/uL (ref 0.7–4.0)
MCH: 24.3 pg — ABNORMAL LOW (ref 26.0–34.0)
MCHC: 30.7 g/dL (ref 30.0–36.0)
MCV: 79.1 fL — ABNORMAL LOW (ref 80.0–100.0)
Monocytes Absolute: 0.8 10*3/uL (ref 0.1–1.0)
Monocytes Relative: 6 %
Neutro Abs: 10.1 10*3/uL — ABNORMAL HIGH (ref 1.7–7.7)
Neutrophils Relative %: 77 %
Platelets: 167 10*3/uL (ref 150–400)
RBC: 6.5 MIL/uL — ABNORMAL HIGH (ref 3.87–5.11)
RDW: 15.9 % — ABNORMAL HIGH (ref 11.5–15.5)
WBC: 13 10*3/uL — ABNORMAL HIGH (ref 4.0–10.5)
nRBC: 0 % (ref 0.0–0.2)

## 2024-02-02 LAB — POCT I-STAT 7, (LYTES, BLD GAS, ICA,H+H)
Acid-Base Excess: 3 mmol/L — ABNORMAL HIGH (ref 0.0–2.0)
Bicarbonate: 26.8 mmol/L (ref 20.0–28.0)
Calcium, Ion: 1.25 mmol/L (ref 1.15–1.40)
HCT: 44 % (ref 36.0–46.0)
Hemoglobin: 15 g/dL (ref 12.0–15.0)
O2 Saturation: 97 %
Patient temperature: 37.7
Potassium: 4.1 mmol/L (ref 3.5–5.1)
Sodium: 139 mmol/L (ref 135–145)
TCO2: 28 mmol/L (ref 22–32)
pCO2 arterial: 40 mmHg (ref 32–48)
pH, Arterial: 7.438 (ref 7.35–7.45)
pO2, Arterial: 94 mmHg (ref 83–108)

## 2024-02-02 LAB — COMPREHENSIVE METABOLIC PANEL WITH GFR
ALT: 27 U/L (ref 0–44)
AST: 30 U/L (ref 15–41)
Albumin: 3.9 g/dL (ref 3.5–5.0)
Alkaline Phosphatase: 75 U/L (ref 38–126)
Anion gap: 16 — ABNORMAL HIGH (ref 5–15)
BUN: 17 mg/dL (ref 8–23)
CO2: 25 mmol/L (ref 22–32)
Calcium: 9.8 mg/dL (ref 8.9–10.3)
Chloride: 99 mmol/L (ref 98–111)
Creatinine, Ser: 1.04 mg/dL — ABNORMAL HIGH (ref 0.44–1.00)
GFR, Estimated: 59 mL/min — ABNORMAL LOW (ref >60)
Glucose, Bld: 155 mg/dL — ABNORMAL HIGH (ref 70–99)
Potassium: 4.6 mmol/L (ref 3.5–5.1)
Sodium: 140 mmol/L (ref 135–145)
Total Bilirubin: 1.3 mg/dL — ABNORMAL HIGH (ref 0.0–1.2)
Total Protein: 7.5 g/dL (ref 6.5–8.1)

## 2024-02-02 LAB — RAPID URINE DRUG SCREEN, HOSP PERFORMED
Amphetamines: NOT DETECTED
Barbiturates: NOT DETECTED
Benzodiazepines: NOT DETECTED
Cocaine: NOT DETECTED
Opiates: NOT DETECTED
Tetrahydrocannabinol: NOT DETECTED

## 2024-02-02 LAB — RESP PANEL BY RT-PCR (RSV, FLU A&B, COVID)  RVPGX2
Influenza A by PCR: NEGATIVE
Influenza B by PCR: NEGATIVE
Resp Syncytial Virus by PCR: NEGATIVE
SARS Coronavirus 2 by RT PCR: NEGATIVE

## 2024-02-02 LAB — I-STAT VENOUS BLOOD GAS, ED
Acid-Base Excess: 4 mmol/L — ABNORMAL HIGH (ref 0.0–2.0)
Bicarbonate: 33 mmol/L — ABNORMAL HIGH (ref 20.0–28.0)
Calcium, Ion: 1.21 mmol/L (ref 1.15–1.40)
HCT: 50 % — ABNORMAL HIGH (ref 36.0–46.0)
Hemoglobin: 17 g/dL — ABNORMAL HIGH (ref 12.0–15.0)
O2 Saturation: 84 %
Potassium: 4.8 mmol/L (ref 3.5–5.1)
Sodium: 141 mmol/L (ref 135–145)
TCO2: 35 mmol/L — ABNORMAL HIGH (ref 22–32)
pCO2, Ven: 68 mmHg — ABNORMAL HIGH (ref 44–60)
pH, Ven: 7.294 (ref 7.25–7.43)
pO2, Ven: 56 mmHg — ABNORMAL HIGH (ref 32–45)

## 2024-02-02 LAB — GLUCOSE, CAPILLARY: Glucose-Capillary: 248 mg/dL — ABNORMAL HIGH (ref 70–99)

## 2024-02-02 LAB — I-STAT CHEM 8, ED
BUN: 24 mg/dL — ABNORMAL HIGH (ref 8–23)
Calcium, Ion: 1.2 mmol/L (ref 1.15–1.40)
Chloride: 102 mmol/L (ref 98–111)
Creatinine, Ser: 1.2 mg/dL — ABNORMAL HIGH (ref 0.44–1.00)
Glucose, Bld: 163 mg/dL — ABNORMAL HIGH (ref 70–99)
HCT: 52 % — ABNORMAL HIGH (ref 36.0–46.0)
Hemoglobin: 17.7 g/dL — ABNORMAL HIGH (ref 12.0–15.0)
Potassium: 4.8 mmol/L (ref 3.5–5.1)
Sodium: 140 mmol/L (ref 135–145)
TCO2: 32 mmol/L (ref 22–32)

## 2024-02-02 LAB — I-STAT CG4 LACTIC ACID, ED
Lactic Acid, Venous: 2.6 mmol/L (ref 0.5–1.9)
Lactic Acid, Venous: 1.2 mmol/L (ref 0.5–1.9)

## 2024-02-02 LAB — TROPONIN I (HIGH SENSITIVITY)
Troponin I (High Sensitivity): 9 ng/L (ref <18)
Troponin I (High Sensitivity): 15 ng/L (ref <18)

## 2024-02-02 LAB — AMMONIA: Ammonia: 44 umol/L — ABNORMAL HIGH (ref 9–35)

## 2024-02-02 LAB — PROTIME-INR
INR: 1 (ref 0.8–1.2)
Prothrombin Time: 13.4 s (ref 11.4–15.2)

## 2024-02-02 LAB — BRAIN NATRIURETIC PEPTIDE: B Natriuretic Peptide: 15.4 pg/mL (ref 0.0–100.0)

## 2024-02-02 MED ORDER — SODIUM CHLORIDE 0.9 % IV SOLN
2.0000 g | INTRAVENOUS | Status: DC
  Administered 2024-02-02 – 2024-02-04 (×2): 2 g via INTRAVENOUS
  Filled 2024-02-02 (×2): qty 20

## 2024-02-02 MED ORDER — ORAL CARE MOUTH RINSE: 15.0000 mL | OROMUCOSAL | Status: DC | PRN

## 2024-02-02 MED ORDER — SODIUM CHLORIDE 0.9 % IV SOLN
100.0000 mg | Freq: Two times a day (BID) | INTRAVENOUS | Status: DC
  Administered 2024-02-03 – 2024-02-04 (×5): 100 mg via INTRAVENOUS
  Filled 2024-02-02 (×6): qty 100

## 2024-02-02 MED ORDER — PROPOFOL 1000 MG/100ML IV EMUL
0.0000 ug/kg/min | INTRAVENOUS | Status: DC
  Administered 2024-02-02: 45 ug/kg/min via INTRAVENOUS
  Administered 2024-02-02: 20 ug/kg/min via INTRAVENOUS
  Administered 2024-02-03: 30 ug/kg/min via INTRAVENOUS
  Administered 2024-02-03: 10 ug/kg/min via INTRAVENOUS
  Administered 2024-02-03 (×2): 20 ug/kg/min via INTRAVENOUS
  Administered 2024-02-04: 30 ug/kg/min via INTRAVENOUS
  Administered 2024-02-04: 10 ug/kg/min via INTRAVENOUS
  Administered 2024-02-04: 20 ug/kg/min via INTRAVENOUS
  Filled 2024-02-02 (×11): qty 100

## 2024-02-02 MED ORDER — PAROXETINE HCL 20 MG PO TABS
20.0000 mg | ORAL_TABLET | Freq: Every day | ORAL | Status: DC
  Administered 2024-02-03 (×2): 20 mg
  Filled 2024-02-02 (×3): qty 1

## 2024-02-02 MED ORDER — PANTOPRAZOLE SODIUM 40 MG IV SOLR
40.0000 mg | Freq: Every day | INTRAVENOUS | Status: DC
  Administered 2024-02-02 – 2024-02-05 (×4): 40 mg via INTRAVENOUS
  Filled 2024-02-02 (×4): qty 10

## 2024-02-02 MED ORDER — FENTANYL BOLUS VIA INFUSION
25.0000 ug | INTRAVENOUS | Status: DC | PRN
  Administered 2024-02-02 – 2024-02-03 (×2): 100 ug via INTRAVENOUS
  Administered 2024-02-03: 50 ug via INTRAVENOUS
  Administered 2024-02-03: 100 ug via INTRAVENOUS
  Administered 2024-02-04: 50 ug via INTRAVENOUS

## 2024-02-02 MED ORDER — PROPOFOL BOLUS VIA INFUSION
30.0000 mg | Freq: Once | INTRAVENOUS | Status: AC
  Administered 2024-02-02: 30 mg via INTRAVENOUS
  Filled 2024-02-02: qty 30

## 2024-02-02 MED ORDER — SIMVASTATIN 20 MG PO TABS
20.0000 mg | ORAL_TABLET | Freq: Every day | ORAL | Status: DC
  Administered 2024-02-03: 20 mg
  Filled 2024-02-02: qty 1

## 2024-02-02 MED ORDER — ETOMIDATE 2 MG/ML IV SOLN
INTRAVENOUS | Status: DC | PRN
  Administered 2024-02-02: 20 mg via INTRAVENOUS

## 2024-02-02 MED ORDER — DOCUSATE SODIUM 50 MG/5ML PO LIQD
100.0000 mg | Freq: Two times a day (BID) | ORAL | Status: DC
  Administered 2024-02-02 – 2024-02-04 (×3): 100 mg
  Filled 2024-02-02 (×3): qty 10

## 2024-02-02 MED ORDER — DOCUSATE SODIUM 100 MG PO CAPS: 100.0000 mg | ORAL_CAPSULE | Freq: Two times a day (BID) | ORAL | Status: DC | PRN

## 2024-02-02 MED ORDER — HYDRALAZINE HCL 20 MG/ML IJ SOLN
10.0000 mg | Freq: Four times a day (QID) | INTRAMUSCULAR | Status: DC | PRN
  Administered 2024-02-03 – 2024-02-05 (×3): 10 mg via INTRAVENOUS
  Filled 2024-02-02 (×3): qty 1

## 2024-02-02 MED ORDER — FENTANYL 2500MCG IN NS 250ML (10MCG/ML) PREMIX INFUSION
0.0000 ug/h | INTRAVENOUS | Status: DC
  Administered 2024-02-02: 50 ug/h via INTRAVENOUS
  Administered 2024-02-03: 75 ug/h via INTRAVENOUS
  Administered 2024-02-04: 25 ug/h via INTRAVENOUS
  Filled 2024-02-02 (×3): qty 250

## 2024-02-02 MED ORDER — ALBUTEROL SULFATE (2.5 MG/3ML) 0.083% IN NEBU: 2.5000 mg | INHALATION_SOLUTION | Freq: Four times a day (QID) | RESPIRATORY_TRACT | Status: DC | PRN

## 2024-02-02 MED ORDER — IPRATROPIUM-ALBUTEROL 0.5-2.5 (3) MG/3ML IN SOLN
3.0000 mL | Freq: Four times a day (QID) | RESPIRATORY_TRACT | Status: DC
  Administered 2024-02-03 – 2024-02-04 (×6): 3 mL via RESPIRATORY_TRACT
  Filled 2024-02-02 (×6): qty 3

## 2024-02-02 MED ORDER — INSULIN ASPART 100 UNIT/ML IJ SOLN
0.0000 [IU] | INTRAMUSCULAR | Status: DC
  Administered 2024-02-02 – 2024-02-03 (×2): 3 [IU] via SUBCUTANEOUS
  Administered 2024-02-03 (×2): 5 [IU] via SUBCUTANEOUS
  Administered 2024-02-03 (×2): 3 [IU] via SUBCUTANEOUS
  Administered 2024-02-03: 5 [IU] via SUBCUTANEOUS

## 2024-02-02 MED ORDER — ORAL CARE MOUTH RINSE
15.0000 mL | OROMUCOSAL | Status: DC
  Administered 2024-02-03 – 2024-02-04 (×20): 15 mL via OROMUCOSAL

## 2024-02-02 MED ORDER — CHLORHEXIDINE GLUCONATE CLOTH 2 % EX PADS
6.0000 | MEDICATED_PAD | Freq: Every day | CUTANEOUS | Status: DC
  Administered 2024-02-03 – 2024-02-08 (×7): 6 via TOPICAL

## 2024-02-02 MED ORDER — PAROXETINE HCL 20 MG PO TABS
40.0000 mg | ORAL_TABLET | Freq: Every day | ORAL | Status: DC
  Administered 2024-02-03 – 2024-02-04 (×2): 40 mg
  Filled 2024-02-02 (×3): qty 2

## 2024-02-02 MED ORDER — POLYETHYLENE GLYCOL 3350 17 G PO PACK
17.0000 g | PACK | Freq: Every day | ORAL | Status: DC
  Administered 2024-02-02 – 2024-02-04 (×3): 17 g
  Filled 2024-02-02 (×3): qty 1

## 2024-02-02 MED ORDER — LEVOTHYROXINE SODIUM 75 MCG PO TABS
175.0000 ug | ORAL_TABLET | Freq: Every day | ORAL | Status: DC
  Administered 2024-02-03: 175 ug
  Filled 2024-02-02: qty 1

## 2024-02-02 MED ORDER — POLYETHYLENE GLYCOL 3350 17 G PO PACK: 17.0000 g | PACK | Freq: Every day | ORAL | Status: DC | PRN

## 2024-02-02 MED ORDER — SUCCINYLCHOLINE CHLORIDE 20 MG/ML IJ SOLN
INTRAMUSCULAR | Status: DC | PRN
  Administered 2024-02-02: 150 mg via INTRAVENOUS

## 2024-02-02 MED ORDER — HEPARIN SODIUM (PORCINE) 5000 UNIT/ML IJ SOLN
5000.0000 [IU] | Freq: Three times a day (TID) | INTRAMUSCULAR | Status: DC
  Administered 2024-02-03 – 2024-02-06 (×10): 5000 [IU] via SUBCUTANEOUS
  Filled 2024-02-02 (×10): qty 1

## 2024-02-02 NOTE — ED Triage Notes (Signed)
 Pt was unresponsive when EMS arrived, shallow resp, BVM. Urinary incontinence. EMS gave 2mg  of narcan . Pt arrives with jerking motion

## 2024-02-02 NOTE — ED Provider Notes (Signed)
 [place] EMERGENCY DEPARTMENT AT [facility] Provider Note   CSN: [id number] Arrival date & time: 02/02/24  1644     History  Chief Complaint  Patient presents with   Seizures    Mary Acosta is a 68 y.o. female.  HPI Patient brought by EMS from home.  Initial history is limited.  Reportedly on arrival patient was having very shallow labored respirations and very poorly responsive.  Hands and feet very cool to the touch.  They initiated bag-valve-mask assist.  Patient has had urinary incontinence.  They noted some jerking type motions.  Patient was given 2 mg of Narcan  IM on the scene without much change.  EMS does report however after respiratory assist, patient was intermittently making some vocalizations that were semiintelligible.  Her mental status have been waxing and waning.  Respirations remained very labored.  This time I do not have family for additional history of evolution of symptoms at home.  Patient was extremely limited in ability to give any verbal response.    Home Medications Prior to Admission medications   Medication Sig Start Date End Date Taking? Authorizing Provider  Adalimumab (HUMIRA PEN) 40 MG/0.4ML PNKT Inject 40 mg into the skin every 14 (fourteen) days. Every other Thursday 01/08/22   [provider]  albuterol  (VENTOLIN  HFA) 108 (90 Base) MCG/ACT inhaler INHALE TWO (2) PUFFS BY MOUTH EVERY 6 HOURS AS NEEDED FOR SHORTNESS OF BREATH AND WHEEZING 06/23/23   Burns, Beckey Bourgeois, MD  Albuterol  Sulfate (PROAIR  RESPICLICK) 108 (90 Base) MCG/ACT AEPB 1 puff as needed Inhalation every 4 hrs    [provider]  amLODipine  (NORVASC ) 5 MG tablet TAKE 1 TABLET BY MOUTH EVERY MORNING AND EVERY DAY AT BEDTIME 05/28/23   Colene Dauphin, MD  atomoxetine (STRATTERA) 40 MG capsule Take 40 mg by mouth daily. 05/20/23   [provider]  Buprenorphine HCl 300 MCG FILM Place 1 Film inside cheek 2 (two) times daily. 10/02/23   [provider]  busPIRone  (BUSPAR ) 15 MG tablet Take 15 mg by mouth 2 (two) times daily. 08/24/23   [provider]  celecoxib  (CELEBREX ) 100 MG capsule TAKE 1 CAPSULE BY MOUTH TWICE A DAY 10/16/23   Colene Dauphin, MD  clonazePAM  (KLONOPIN ) 0.5 MG tablet Take 0.5 mg by mouth daily as needed.    [provider]  cloNIDine (CATAPRES) 0.1 MG tablet Take 0.1 mg by mouth every 8 (eight) hours as needed. 03/11/22   [provider]  colchicine  0.6 MG tablet Take 1 tablet (0.6 mg total) by mouth daily. 12/04/23   Colene Dauphin, MD  Continuous Blood Gluc Sensor (FREESTYLE LIBRE 3 SENSOR) MISC 1 each by Does not apply route every 14 (fourteen) days. 08/08/22   Emilie Harden, MD  Continuous Glucose Receiver (DEXCOM G7 RECEIVER) DEVI See admin instructions. 11/19/23   [provider]  Continuous Glucose Sensor (DEXCOM G7 SENSOR) MISC 3 each by Does not apply route every 30 (thirty) days. Apply 1 sensor every 10 days 11/19/23   Emilie Harden, MD  ergocalciferol  (VITAMIN D2) 1.25 MG (50000 UT) capsule Take 1 capsule (50,000 Units total) by mouth once a week. Monday 12/02/23   Colene Dauphin, MD  fexofenadine (ALLEGRA) 180 MG tablet Take 180 mg by mouth daily as needed for allergies or rhinitis.    [provider]  folic acid (FOLVITE) 1 MG tablet Take 1 mg by mouth daily. 12/21/21   [provider]  furosemide  (  LASIX ) 40 MG tablet TAKE 1 TABLET BY MOUTH EACH MORNING 06/27/23   Colene Dauphin, MD  gabapentin  (NEURONTIN ) 300 MG capsule Take 300 mg by mouth 3 (three) times daily as needed (pain). 06/10/19   [provider]  hydrALAZINE  (APRESOLINE ) 25 MG tablet TAKE ONE TABLET BY MOUTH AT [facility] AND AT BEDTIME May take extra tab if needed if bp over 170/80 04/25/23   Lenise Quince, MD  hydrOXYzine  (VISTARIL ) 50 MG capsule Take 50 mg by mouth 2 (two) times daily as needed for itching or anxiety. 07/04/21   [provider]  insulin  glargine,  1 Unit Dial, (TOUJEO  SOLOSTAR) 300 UNIT/ML Solostar Pen Inject 80 Units into the skin daily. 11/20/23   Emilie Harden, MD  Insulin  Pen Needle (BD PEN NEEDLE NANO U/F) 32G X 4 MM MISC USE AS DIRECTED FIVE TIMES A DAY 05/12/23   Emilie Harden, MD  Insulin  Syringe-Needle U-100 26G X 1/2" 1 ML MISC Use daily for insulin  injection as directed 10/22/19   Colene Dauphin, MD  Ipratropium-Albuterol  (COMBIVENT  RESPIMAT) 20-100 MCG/ACT AERS respimat Inhale 1 puff into the lungs every 6 (six) hours as needed for wheezing. 06/21/22   Colene Dauphin, MD  ipratropium-albuterol  (DUONEB) 0.5-2.5 (3) MG/3ML SOLN Take 3 mLs by nebulization every 6 (six) hours as needed (Wheezing or dyspnea.). 06/05/20   Maire Scot, MD  levothyroxine  (SYNTHROID ) 175 MCG tablet TAKE 1 TABLET BY MOUTH ONCE DAILY BEFORE BREAKFAST 11/26/23   Emilie Harden, MD  lidocaine  (LIDODERM ) 5 % Place 1 patch onto the skin. 12/26/23 03/25/24  [provider]  metFORMIN  (GLUCOPHAGE ) 500 MG tablet TAKE 1 TABLET BY MOUTH EACH MORNING AND TAKE 1 TABLET EVERY DAY AT BEDTIME 05/29/23   Emilie Harden, MD  metoprolol  succinate (TOPROL -XL) 25 MG 24 hr tablet TAKE 1 TABLET BY MOUTH EVERY DAY AT BEDTIME WITH OR IMMEDIATLY FOLLOWING A MEAL 10/23/23   Lenise Quince, MD  naloxone  (NARCAN ) nasal spray 4 mg/0.1 mL One spray in either nostril once for known/suspected opioid overdose. May repeat every 2-3 minutes in alternating nostril til EMS arrives 07/07/23   [provider]  OXYGEN  Inhale 3 L into the lungs at bedtime.    [provider]  PARoxetine  (PAXIL ) 20 MG tablet Take 20 mg by mouth at bedtime. 07/18/23   [provider]  PARoxetine  (PAXIL ) 40 MG tablet Take 1 tablet (40 mg total) by mouth daily. 02/17/20   Colene Dauphin, MD  potassium chloride  SA (KLOR-CON  M20) 20 MEQ tablet Take 2 tab daily 02/28/23   Jude Norton, NP  promethazine  (PHENERGAN ) 25 MG tablet TAKE 1/2 TABLET BY MOUTH EVERY 8 HOURS AS NEEDED  FOR NAUSEA AND VOMITING 12/26/23   Albertina Hugger, MD  promethazine  (PHENERGAN ) 25 MG tablet Take 1 tablet (25 mg total) by mouth every 6 (six) hours as needed for nausea or vomiting. 12/18/23   Albertina Hugger, MD  Propylene Glycol (SYSTANE BALANCE) 0.6 % SOLN Place 1 drop into both eyes daily as needed (dry eyes).    [provider]  Semaglutide , 1 MG/DOSE, (OZEMPIC , 1 MG/DOSE,) 4 MG/3ML SOPN INJECT 1MG  INTO THE SKIN ONCE A WEEK 12/30/23   Emilie Harden, MD  simvastatin  (ZOCOR ) 20 MG tablet TAKE 1 TABLET BY MOUTH EVERY DAY AT BEDTIME 11/26/23   Lenise Quince, MD  tiZANidine  (ZANAFLEX ) 4 MG tablet Take 4 mg by mouth every 8 (eight) hours as needed for muscle spasms. 05/24/21   [provider]  TRESIBA  FLEXTOUCH 200 UNIT/ML FlexTouch Pen SMARTSIG:80 Unit(s) SUB-Q Daily 11/20/23   [provider]      Allergies    Gabapentin , Losartan, Aricept  [donepezil  hcl], Aripiprazole, Hydroxyzine , Oxycodone-acetaminophen , Oxycodone, Sulfa antibiotics, and Sulfonamide derivatives    Review of Systems   Review of Systems  Physical Exam Updated Vital Signs BP (!) 198/85   Pulse 100   Temp 99.2 F (37.3 C)   Resp 20   SpO2 100%  Physical Exam Constitutional:      Comments: Patient arrives by EMS.  They have nonrebreather mask in place.  Patient's respirations are very labored and tachypnea with shallow respirations.  Patient is morbidly obese and exhibiting some myoclonic jerking.  HENT:     Mouth/Throat:     Comments: Mucous membranes are very dry.  Patient has dentures in.  Oral cavity is clear of secretions. Eyes:     Pupils: Pupils are equal, round, and reactive to light.  Cardiovascular:     Rate and Rhythm: Tachycardia present. Rhythm irregular.  Pulmonary:     Comments: Patient is having significant increased work of breathing with short frequent efforts at inspiration.  With bag-valve-mask assist, patient has bilateral symmetric breath sounds. Abdominal:      Comments: Morbidly obese abdomen.  No visible trauma or bruising.  Musculoskeletal:     Comments: About 2+ edema of the lower extremities.  Feet have both erythema and violaceous skin color changes.  Neurological:     Comments: Initially, patient was not making any vocalizations.  She had significant effort at respiration and some appearance of intermittent myoclonic jerking.  After a period of bag-valve-mask assist, patient started to produce few intelligible words.     ED Results / Procedures / Treatments   Labs (all labs ordered are listed, but only abnormal results are displayed) Labs Reviewed  I-STAT CG4 LACTIC ACID, ED - Abnormal; Notable for the following components:      Result Value   Lactic Acid, Venous 2.6 (*)    All other components within normal limits  I-STAT CHEM 8, ED - Abnormal; Notable for the following components:   BUN 24 (*)    Creatinine, Ser 1.20 (*)    Glucose, Bld 163 (*)    Hemoglobin 17.7 (*)    HCT 52.0 (*)    All other components within normal limits  I-STAT VENOUS BLOOD GAS, ED - Abnormal; Notable for the following components:   pCO2, Ven 68.0 (*)    pO2, Ven 56 (*)    Bicarbonate 33.0 (*)    TCO2 35 (*)    Acid-Base Excess 4.0 (*)    HCT 50.0 (*)    Hemoglobin 17.0 (*)    All other components within normal limits  I-STAT ARTERIAL BLOOD GAS, ED - Abnormal; Notable for the following components:   pH, Arterial 7.297 (*)    pCO2 arterial 56.8 (*)    pO2, Arterial 343 (*)    Hemoglobin 15.6 (*)    All other components within normal limits  CULTURE, BLOOD (ROUTINE X 2)  CULTURE, BLOOD (ROUTINE X 2)  COMPREHENSIVE METABOLIC PANEL WITH GFR  CBC WITH DIFFERENTIAL/PLATELET  PROTIME-INR  TRIGLYCERIDES  AMMONIA  URINALYSIS, ROUTINE W REFLEX MICROSCOPIC  RAPID URINE DRUG SCREEN, HOSP PERFORMED  BRAIN NATRIURETIC PEPTIDE  BLOOD GAS, ARTERIAL  TROPONIN I (HIGH SENSITIVITY)    EKG EKG Interpretation Date/Time:  Monday February 02 2024 16:58:18  EDT Ventricular Rate:  104 PR Interval:  119 QRS Duration:  141  QT Interval:  378 QTC Calculation: 498 R Axis:   174  Text Interpretation: Ectopic atrial tachycardia, unifocal RBBB seen on previous tracing. no sig chnage from older tracings Confirmed by Wynetta Heckle [phone]) on 02/02/2024 5:20:37 PM  Radiology No results found.  Procedures Procedure Name: Intubation Date/Time: 02/02/2024 5:21 PM  Performed by: Wynetta Heckle, MDPre-anesthesia Checklist: Patient identified, Patient being monitored, Emergency Drugs available and Suction available Oxygen  Delivery Method: Ambu bag Preoxygenation: Pre-oxygenation with 100% oxygen  Induction Type: Rapid sequence Ventilation: Mask ventilation without difficulty Laryngoscope Size: Glidescope and 4 Grade View: Grade III Tube size: 7.5 mm Number of attempts: 1 Placement Confirmation: ETT inserted through vocal cords under direct vision, Positive ETCO2, Breath sounds checked- equal and bilateral and CO2 detector Secured at: 22 cm Tube secured with: ETT holder     CRITICAL CARE Performed by: Wynetta Heckle   Total critical care time: 60 minutes  Critical care time was exclusive of separately billable procedures and treating other patients.  Critical care was necessary to treat or prevent imminent or life-threatening deterioration.  Critical care was time spent personally by me on the following activities: development of treatment plan with patient and/or surrogate as well as nursing, discussions with consultants, evaluation of patient's response to treatment, examination of patient, obtaining history from patient or surrogate, ordering and performing treatments and interventions, ordering and review of laboratory studies, ordering and review of radiographic studies, pulse oximetry and re-evaluation of patient's condition.    Medications Ordered in ED Medications  etomidate (AMIDATE) injection (20 mg Intravenous Given 02/02/24 1702)   succinylcholine (ANECTINE) injection (150 mg Intravenous Given 02/02/24 1703)  fentaNYL  in NS (10mcg/ml) infusion-PREMIX (150 mcg/hr Intravenous Rate/Dose Change 02/02/24 1730)  fentaNYL  (SUBLIMAZE ) bolus via infusion 25-100 mcg (100 mcg Intravenous Bolus from Bag 02/02/24 1730)  propofol  (DIPRIVAN ) 1000 MG/100ML infusion (45 mcg/kg/min  140 kg (Order-Specific) Intravenous Rate/Dose Change 02/02/24 1745)  propofol  (DIPRIVAN ) bolus via infusion 30 mg (30 mg Intravenous Bolus from Bag 02/02/24 1730)    ED Course/ Medical Decision Making/ A&P                                 Medical Decision Making Amount and/or Complexity of Data Reviewed Labs: ordered. Radiology: ordered.  Risk Prescription drug management.  Presents as outlined.  She presents with severely altered mental status, obtunded and in severe respiratory distress.  With bag-valve-mask patient was showing some sign of improving mental status able to generate 1 or 2 words.  Patient remained in critical condition with severe respiratory distress and was emergently intubated.  pH 7.2 pCO2 68 on venous gas  I-STAT lactic 2.6.  I-STAT BUN/creatinine 24 and 1.2 i-STAT potassium 4.8 i-STAT hematocrit 17.7.  Other lab work is pending currently  I have personally reviewed CT head.  Radiology review is pending.  I do not appreciate any large or acute appearing intracerebral hemorrhage or midline shift or obvious stroke.  I personally reviewed the patient's portable chest x-ray postintubation.  ET tube is at the level of the clavicles.  Both lungs are expanded.  There appears to be some mild vascular congestion but no large focal consolidations or significant vascular congestion.  At this time I suspect patient's altered mental status is secondary to hypoventilation and hypercapnia with myoclonic jerking.  Possibly exacerbated by sedating medications.  EMS had suggested possible seizure activity although by their description she was  in significant respiratory distress  and obtunded on arrival with cool extremities and unable to obtain initial pulse ox.  EMS reports administering 2 mg IM Narcan  without change.  Patient was not hypoglycemic.  Patient has not been hypotensive.  Temp Foley has not documented any fever.  Differential diagnosis includes sepsis and septic workup in process.  No source identified at this time.  Consult: Critical care will see the patient in the emergency department for admission.       Final Clinical Impression(s) / ED Diagnoses Final diagnoses:  Acute respiratory failure with hypoxia and hypercapnia (HCC)  Altered mental status, unspecified altered mental status type    Rx / DC Orders ED Discharge Orders     None         Wynetta Heckle, MD 02/02/24 1843

## 2024-02-02 NOTE — H&P (Cosign Needed Addendum)
 NAME:  Mary Acosta, MRN:  [id number], DOB:  01-20-1956, LOS: 0 ADMISSION DATE:  02/02/2024, CONSULTATION DATE: 6/9 REFERRING MD: Dr. Daivd Dub EDP, CHIEF COMPLAINT: Dyspnea  History of Present Illness:  68 year old woman with past medical history as below, which is significant for chronic pain with narcotic use, COPD, diabetes complicated by diabetic neuropathy, cirrhosis, obstructive sleep apnea (unable to tolerate CPAP), and polymyalgia rheumatica.    Patient is intubated; therefore, history has been obtained from chart review. EMS was called to the house 6/9 for patient having labored respirations and poor responsiveness.  Upon EMS arrival the patient immediately required bag-valve-mask assist ventilations.  EMS administered Narcan  without effect.  With assisted ventilations the patient did gain some level of responsiveness although remained quite poor.  The patient remained dyspneic and poorly responsive upon arrival to the emergency department she was intubated.  Chest x-ray concerning for vascular congestion.  Laboratory evaluation demonstrated hypercapnia.   PCCM asked to admit for respiratory failure.  Pertinent Medical History:   Past Medical History:  Diagnosis Date   AKI (acute kidney injury) (HCC) 01/2017   Allergy    seasonal   Anxiety    with panic attacks   Arthritis    "back; feet; hands; shoulders" (08/26/2014)   Asthma    Cataract    forming   Cervical cancer (HCC)    Chronic lower back pain    Chronic narcotic use    Chronic pain syndrome    [facility]   Cirrhosis (HCC)    Clostridium difficile infection 2017   COPD (chronic obstructive pulmonary disease) (HCC)    Daily headache    past hx   Depression    Diabetic neuropathy (HCC) 06/04/2017   feet  and legs , some in hands   DJD (degenerative joint disease)    Fatty liver disease, nonalcoholic    Fibromyalgia    Frequency of urination    HCAP (healthcare-associated pneumonia)  08/26/2014   History of TIA (transient ischemic attack) 11/01/2010   NO RESIDUAL   Hyperlipidemia    Hypertension    Hypothyroidism    IDDM (insulin  dependent diabetes mellitus)    Insomnia    Lumbar stenosis    Macular degeneration    Memory difficulty 07/25/2016   Nocturia    OSA (obstructive sleep apnea)    NO CPAP SINCE WT LOSS   Osteoarthritis    with severe disease in knee   Oxygen  deficiency    4  liters at bedtime only   Pneumonia "several times"   Polymyalgia rheumatica (HCC)    Scoliosis    Seasonal allergies    Stroke (HCC)    TIA   Thyroid  cancer (HCC)    Urgency of urination    Vaginal pain S/P SLING  FEB 2012   Significant Hospital Events: Including procedures, antibiotic start and stop dates in addition to other pertinent events   6/9 - Presented to [facility] ED via EMS for dyspnea, respiratory failure. Intubated in ED. Hypercapnic and hypoxic on gas. Received Narcan  to ensure no component of narcosis. PCCM consulted for ICU admission.  Interim History / Subjective:  PCCM consulted for ICU admission.  Objective:   Blood pressure (!) 179/67, pulse 99, temperature 99.2 F (37.3 C), resp. rate 20, SpO2 100%.    Vent Mode: PRVC FiO2 (%):  [40 %-100 %] 40 % Set Rate:  [15 bmp-20 bmp] 20 bmp Vt Set:  [500 mL] 500 mL PEEP:  [5 cmH20]  5 cmH20  No intake or output data in the 24 hours ending 02/02/24 1841 There were no vitals filed for this visit.  Physical Examination: General: Acute-on-chronically ill-appearing middle-aged woman in NAD. HEENT: [place]/AT, anicteric sclera, PERRL 2mm very sluggish, moist mucous membranes. Neuro: Intubated, heavily sedated. Does not respond to verbal, tactile or noxious stimuli. Not following commands. Now withdrawal to pain. No spontaneous movement of extremities on my exam.  No cough/gag.  CV: RRR, no m/g/r. PULM: Breathing even and unlabored on vent (PEEP 5, FiO2 40%). Lung fields CTAB, slightly diminished at bases. Pooled clear  secretions in oropharynx, thick tan secretions from in-line suction. GI: Obese, soft, nontender, nondistended. Normoactive bowel sounds. Extremities: Bilateral symmetric chronic-appearing 1+ LE edema noted. Skin: Warm/dry, +venous stasis changes of BLE.  Resolved problem List:   Assessment and Plan:   Acute hypercarbic respiratory failure Decompensated OSA, ?OHS (not on CPAP) History of COPD on nocturnal O2 PNA, ?aspiration Presented to [facility] ED 6/9 for dyspnea. Required BVM ventilation with EMS. On arrival to ED, mental status initially improved but quickly became somnolent again. Hypercapnic and hypoxic with concern for airway protection. Intubated in ED. - Continue full vent support (4-8cc/kg IBW) - Wean FiO2 for O2 sat > 90% - Daily WUA/SBT as mental status/vent parameters permit; heavily sedated at present - VAP bundle - Bronchodilators (DuoNeb Q6H, albuterol  Q6H PRN); can escalate to triple therapy if needed - Pulmonary hygiene - PAD protocol for sedation: Propofol  and Fentanyl  for goal RASS 0 to -1 - Follow CXR, repeat ABG - Aspiration/PNA coverage for now  Acute encephalopathy, ?ischemia History of TIA CT Head with geographic area of decreased attenuation in the central aspect of the right cerebellar hemisphere suspicious for acute ischemia. - Neuro consulted - May need MRI Brain for further information - Frequent neurochecks - Neuroprotective measures: HOB > 30 degrees, normoglycemia, normothermia, electrolytes WNL  Leukocytosis, unclear source ?Aspiration PNA versus urinary source versus other Lactic acidosis - Goal MAP > 65 - Fluid resuscitation as tolerated - Trend WBC, fever curve - F/u Cx data; legionella/urine strep - Continue broad-spectrum antibiotics (ceftriaxone/doxy, escalate as needed)  HTN HLD - Cardiac monitoring - Optimize electrolytes for K > 4, Mg > 2 - F/u Echo - Resume home antihypertensives as appropriate - Resume home  statin  Hypothyroidism - Resume home Synthroid   DM - SSI - CBGs Q4H - Goal CBG 140-180 - Hold home metformin , Tresiba   NAFLD  Chronic low back pain Chronic narcotic use Polymyalgia rheumatica Fibromyalgia - PAD protocol as above for now - Hold home Humira while ?infected - Hold home buprenorphine  Anxiety - Resume home Paxil  as appropriate  Best Practice (right click and "Reselect all SmartList Selections" daily)   Diet/type: NPO DVT prophylaxis SCDs, SQH GI prophylaxis: PPI Lines: N/A Foley:  Yes, and it is still needed Code Status:  full code Last date of multidisciplinary goals of care discussion [Pending]  Labs   CBC: Recent Labs  Lab 02/02/24 1751 02/02/24 1800  HGB 15.6* 17.0*  17.7*  HCT 46.0 50.0*  52.0*   Basic Metabolic Panel: Recent Labs  Lab 02/02/24 1751 02/02/24 1800  NA 140 141  140  K 4.0 4.8  4.8  CL  --  102  GLUCOSE  --  163*  BUN  --  24*  CREATININE  --  1.20*   GFR: CrCl cannot be calculated (Unknown ideal weight.). Recent Labs  Lab 02/02/24 1800  LATICACIDVEN 2.6*   Liver Function Tests: No results  for input(s): "AST", "ALT", "ALKPHOS", "BILITOT", "PROT", "ALBUMIN" in the last 168 hours. No results for input(s): "LIPASE", "AMYLASE" in the last 168 hours. No results for input(s): "AMMONIA" in the last 168 hours.  ABG:    Component Value Date/Time   PHART 7.297 (L) 02/02/2024 1751   PCO2ART 56.8 (H) 02/02/2024 1751   PO2ART 343 (H) 02/02/2024 1751   HCO3 33.0 (H) 02/02/2024 1800   TCO2 32 02/02/2024 1800   TCO2 35 (H) 02/02/2024 1800   ACIDBASEDEF 0.1 02/27/2023 1049   O2SAT 84 02/02/2024 1800    Coagulation Profile: No results for input(s): "INR", "PROTIME" in the last 168 hours.  Cardiac Enzymes: No results for input(s): "CKTOTAL", "CKMB", "CKMBINDEX", "TROPONINI" in the last 168 hours.  HbA1C: Hemoglobin A1C  Date/Time Value Ref Range Status  11/19/2023 01:19 PM 11.5 (A) 4.0 - 5.6 % Final  12/24/2022  10:10 AM 12.1 (A) 4.0 - 5.6 % Final   Hgb A1c MFr Bld  Date/Time Value Ref Range Status  05/27/2023 03:37 PM 13.0 (H) 4.6 - 6.5 % Final    Comment:    Glycemic Control Guidelines for People with Diabetes:Non Diabetic:  <6%Goal of Therapy: <7%Additional Action Suggested:  >8%   05/03/2020 03:17 PM 10.4 (H) <5.7 % of total Hgb Final    Comment:    For someone without known diabetes, a hemoglobin A1c value of 6.5% or greater indicates that they may have  diabetes and this should be confirmed with a follow-up  test. . For someone with known diabetes, a value <7% indicates  that their diabetes is well controlled and a value  greater than or equal to 7% indicates suboptimal  control. A1c targets should be individualized based on  duration of diabetes, age, comorbid conditions, and  other considerations. . Currently, no consensus exists regarding use of hemoglobin A1c for diagnosis of diabetes for children. .    CBG: No results for input(s): "GLUCAP" in the last 168 hours.  Review of Systems:   Patient is encephalopathic and/or intubated; therefore, history has been obtained from chart review.   Past Medical History:  She,  has a past medical history of AKI (acute kidney injury) (HCC) (01/2017), Allergy, Anxiety, Arthritis, Asthma, Cataract, Cervical cancer (HCC), Chronic lower back pain, Chronic narcotic use, Chronic pain syndrome, Cirrhosis (HCC), Clostridium difficile infection (2017), COPD (chronic obstructive pulmonary disease) (HCC), Daily headache, Depression, Diabetic neuropathy (HCC) (06/04/2017), DJD (degenerative joint disease), Fatty liver disease, nonalcoholic, Fibromyalgia, Frequency of urination, HCAP (healthcare-associated pneumonia) (08/26/2014), History of TIA (transient ischemic attack) (11/01/2010), Hyperlipidemia, Hypertension, Hypothyroidism, IDDM (insulin  dependent diabetes mellitus), Insomnia, Lumbar stenosis, Macular degeneration, Memory difficulty (07/25/2016),  Nocturia, OSA (obstructive sleep apnea), Osteoarthritis, Oxygen  deficiency, Pneumonia ("several times"), Polymyalgia rheumatica (HCC), Scoliosis, Seasonal allergies, Stroke (HCC), Thyroid  cancer (HCC), Urgency of urination, and Vaginal pain (S/P SLING  FEB 2012).   Surgical History:   Past Surgical History:  Procedure Laterality Date   APPENDECTOMY  1982   BREAST EXCISIONAL BIOPSY Left 02/28/2005   Atypical Ductal Hyperplasia   CARDIAC CATHETERIZATION  09/04/2004   NORMAL CORONARY ANATOMY/ NORMAL LVF/ EF 60%   CARDIOVASCULAR STRESS TEST  12-27-2010  DR [place]   ABNORMAL NUCLEAR STUDY W/ /MILD INFERIOR ISCHEMIA/ EF 69%/  CT HEART ANGIOGRAM ;  NO ACUTE FINDINGS   COLONOSCOPY     CRYOABLATION  05/16/2003   w/LEEP FOR ABNORMAL PAP SMEAR   CYSTOSCOPY  05/18/2012   Procedure: CYSTOSCOPY;  Surgeon: Devorah Fonder, MD;  Location: [facility];  Service: Urology;  Laterality: N/A;  examination under anethesia   ESOPHAGOGASTRODUODENOSCOPY (EGD) WITH PROPOFOL  N/A 09/03/2016   Procedure: ESOPHAGOGASTRODUODENOSCOPY (EGD) WITH PROPOFOL ;  Surgeon: Albertina Hugger, MD;  Location: [facility];  Service: Gastroenterology;  Laterality: N/A;   ESOPHAGOGASTRODUODENOSCOPY (EGD) WITH PROPOFOL  N/A 11/19/2021   Procedure: ESOPHAGOGASTRODUODENOSCOPY (EGD) WITH PROPOFOL ;  Surgeon: Albertina Hugger, MD;  Location: [facility];  Service: Gastroenterology;  Laterality: N/A;  Varices screeing, cirrhosis   HYSTEROSCOPY WITH D & C  08/19/2007   PMB   KNEE ARTHROSCOPY W/ DEBRIDEMENT Left 03/29/2006   INTERNAL DERANGEMENT/ SEVERE DJD/ MENISCUS TEARS   LAPAROSCOPIC CHOLECYSTECTOMY  06/10/2002   LAPAROSCOPIC GASTRIC BANDING  03/01/2006   TRUNCAL VAGOTOMY/ PLACEMENT OF VG BAND   REVISION TOTAL KNEE ARTHROPLASTY Left 08-29-2008; 05/2009   TONSILLECTOMY  1969   TOTAL KNEE ARTHROPLASTY Left 01/23/2007   SEVERE DJD   TOTAL THYROIDECTOMY  11/22/2005   BILATERAL THYROID  NODULES-- PAPILLARY CARCINOMA  (0.5CM)/ ADENOMATOID NODULES   TRANSTHORACIC ECHOCARDIOGRAM  12/27/2010   LVSF NORMAL / EF 55-65%/ GRADE I DIASTOLIC DYSFUNCTION/ MILD MITRAL REGURG. / MILDLY DILATED LEFT ATRIUM/ MILDY INCREASED SYSTOLIC PRESSURE OF PULMONARY ARTERIES   TRANSVAGINAL SUBURETERAL TAPE/ SLING  09/28/2010   MIXED URINARY INCONTINENCE   TUBAL LIGATION  1983    Social History:   reports that she quit smoking about 13 years ago. Her smoking use included cigarettes. She started smoking about 52 years ago. She has a 97.5 pack-year smoking history. She has never used smokeless tobacco. She reports that she does not drink alcohol and does not use drugs.   Family History:  Her family history includes Breast cancer in an other family member; Colon cancer in her maternal uncle; Colon polyps in her mother; Dementia in her mother; Diabetes in her mother; Heart disease in her father and mother; High blood pressure in her father; Stomach cancer in her paternal uncle. There is no history of Esophageal cancer or Rectal cancer.   Allergies: Allergies  Allergen Reactions   Gabapentin  Swelling    Swelling in legs    Losartan Other (See Comments)    Myalgias and muscle cramping    Aricept  [Donepezil  Hcl]     Nausea/vomiting, low BP   Aripiprazole Other (See Comments)    Patient reports Hypoglycemia   Hydroxyzine      hallucinations   Oxycodone-Acetaminophen  Itching   Oxycodone Itching   Sulfa Antibiotics Nausea Only and Rash   Sulfonamide Derivatives Nausea Only and Rash    Home Medications  Prior to Admission medications   Medication Sig Start Date End Date Taking? Authorizing Provider  Adalimumab (HUMIRA PEN) 40 MG/0.4ML PNKT Inject 40 mg into the skin every 14 (fourteen) days. Every other Thursday 01/08/22   [provider]  albuterol  (VENTOLIN  HFA) 108 (90 Base) MCG/ACT inhaler INHALE TWO (2) PUFFS BY MOUTH EVERY 6 HOURS AS NEEDED FOR SHORTNESS OF BREATH AND WHEEZING 06/23/23   Burns, Beckey Bourgeois, MD  Albuterol   Sulfate (PROAIR  RESPICLICK) 108 (90 Base) MCG/ACT AEPB 1 puff as needed Inhalation every 4 hrs    [provider]  amLODipine  (NORVASC ) 5 MG tablet TAKE 1 TABLET BY MOUTH EVERY MORNING AND EVERY DAY AT BEDTIME 05/28/23   Colene Dauphin, MD  atomoxetine (STRATTERA) 40 MG capsule Take 40 mg by mouth daily. 05/20/23   [provider]  Buprenorphine HCl 300 MCG FILM Place 1 Film inside cheek 2 (two) times daily. 10/02/23   [provider]  busPIRone  (BUSPAR ) 15 MG tablet  Take 15 mg by mouth 2 (two) times daily. 08/24/23   [provider]  celecoxib  (CELEBREX ) 100 MG capsule TAKE 1 CAPSULE BY MOUTH TWICE A DAY 10/16/23   Colene Dauphin, MD  clonazePAM  (KLONOPIN ) 0.5 MG tablet Take 0.5 mg by mouth daily as needed.    [provider]  cloNIDine (CATAPRES) 0.1 MG tablet Take 0.1 mg by mouth every 8 (eight) hours as needed. 03/11/22   [provider]  colchicine  0.6 MG tablet Take 1 tablet (0.6 mg total) by mouth daily. 12/04/23   Colene Dauphin, MD  Continuous Blood Gluc Sensor (FREESTYLE LIBRE 3 SENSOR) MISC 1 each by Does not apply route every 14 (fourteen) days. 08/08/22   Emilie Harden, MD  Continuous Glucose Receiver (DEXCOM G7 RECEIVER) DEVI See admin instructions. 11/19/23   [provider]  Continuous Glucose Sensor (DEXCOM G7 SENSOR) MISC 3 each by Does not apply route every 30 (thirty) days. Apply 1 sensor every 10 days 11/19/23   Emilie Harden, MD  ergocalciferol  (VITAMIN D2) 1.25 MG (50000 UT) capsule Take 1 capsule (50,000 Units total) by mouth once a week. Monday 12/02/23   Colene Dauphin, MD  fexofenadine (ALLEGRA) 180 MG tablet Take 180 mg by mouth daily as needed for allergies or rhinitis.    [provider]  folic acid (FOLVITE) 1 MG tablet Take 1 mg by mouth daily. 12/21/21   [provider]  furosemide  (LASIX ) 40 MG tablet TAKE 1 TABLET BY MOUTH EACH MORNING 06/27/23   Burns, Beckey Bourgeois, MD  gabapentin  (NEURONTIN )  300 MG capsule Take 300 mg by mouth 3 (three) times daily as needed (pain). 06/10/19   [provider]  hydrALAZINE  (APRESOLINE ) 25 MG tablet TAKE ONE TABLET BY MOUTH AT [facility] AND AT BEDTIME May take extra tab if needed if bp over 170/80 04/25/23   Lenise Quince, MD  hydrOXYzine  (VISTARIL ) 50 MG capsule Take 50 mg by mouth 2 (two) times daily as needed for itching or anxiety. 07/04/21   [provider]  insulin  glargine, 1 Unit Dial, (TOUJEO  SOLOSTAR) 300 UNIT/ML Solostar Pen Inject 80 Units into the skin daily. 11/20/23   Emilie Harden, MD  Insulin  Pen Needle (BD PEN NEEDLE NANO U/F) 32G X 4 MM MISC USE AS DIRECTED FIVE TIMES A DAY 05/12/23   Emilie Harden, MD  Insulin  Syringe-Needle U-100 26G X 1/2" 1 ML MISC Use daily for insulin  injection as directed 10/22/19   Colene Dauphin, MD  Ipratropium-Albuterol  (COMBIVENT  RESPIMAT) 20-100 MCG/ACT AERS respimat Inhale 1 puff into the lungs every 6 (six) hours as needed for wheezing. 06/21/22   Burns, Beckey Bourgeois, MD  ipratropium-albuterol  (DUONEB) 0.5-2.5 (3) MG/3ML SOLN Take 3 mLs by nebulization every 6 (six) hours as needed (Wheezing or dyspnea.). 06/05/20   Maire Scot, MD  levothyroxine  (SYNTHROID ) 175 MCG tablet TAKE 1 TABLET BY MOUTH ONCE DAILY BEFORE BREAKFAST 11/26/23   Emilie Harden, MD  lidocaine  (LIDODERM ) 5 % Place 1 patch onto the skin. 12/26/23 03/25/24  [provider]  metFORMIN  (GLUCOPHAGE ) 500 MG tablet TAKE 1 TABLET BY MOUTH EACH MORNING AND TAKE 1 TABLET EVERY DAY AT BEDTIME 05/29/23   Emilie Harden, MD  metoprolol  succinate (TOPROL -XL) 25 MG 24 hr tablet TAKE 1 TABLET BY MOUTH EVERY DAY AT BEDTIME WITH OR IMMEDIATLY FOLLOWING A MEAL 10/23/23   Lenise Quince, MD  naloxone  (NARCAN ) nasal spray 4 mg/0.1 mL One spray in either nostril once for known/suspected opioid overdose. May repeat every  2-3 minutes in alternating nostril til EMS arrives 07/07/23   [provider]  OXYGEN  Inhale 3 L  into the lungs at bedtime.    [provider]  PARoxetine  (PAXIL ) 20 MG tablet Take 20 mg by mouth at bedtime. 07/18/23   [provider]  PARoxetine  (PAXIL ) 40 MG tablet Take 1 tablet (40 mg total) by mouth daily. 02/17/20   Colene Dauphin, MD  potassium chloride  SA (KLOR-CON  M20) 20 MEQ tablet Take 2 tab daily 02/28/23   Jude Norton, NP  promethazine  (PHENERGAN ) 25 MG tablet TAKE 1/2 TABLET BY MOUTH EVERY 8 HOURS AS NEEDED FOR NAUSEA AND VOMITING Patient taking differently: Take 12.5 mg by mouth every 8 (eight) hours as needed for nausea or vomiting. 12/26/23   Albertina Hugger, MD  promethazine  (PHENERGAN ) 25 MG tablet Take 1 tablet (25 mg total) by mouth every 6 (six) hours as needed for nausea or vomiting. 12/18/23   Albertina Hugger, MD  Propylene Glycol (SYSTANE BALANCE) 0.6 % SOLN Place 1 drop into both eyes daily as needed (dry eyes).    [provider]  Semaglutide , 1 MG/DOSE, (OZEMPIC , 1 MG/DOSE,) 4 MG/3ML SOPN INJECT 1MG  INTO THE SKIN ONCE A WEEK 12/30/23   Emilie Harden, MD  simvastatin  (ZOCOR ) 20 MG tablet TAKE 1 TABLET BY MOUTH EVERY DAY AT BEDTIME 11/26/23   Lenise Quince, MD  tiZANidine  (ZANAFLEX ) 4 MG tablet Take 4 mg by mouth every 8 (eight) hours as needed for muscle spasms. 05/24/21   [provider]  TRESIBA  FLEXTOUCH 200 UNIT/ML FlexTouch Pen SMARTSIG:80 Unit(s) SUB-Q Daily 11/20/23   [provider]    Critical care time:    The patient is critically ill with multiple organ system failure and requires high complexity decision making for assessment and support, frequent evaluation and titration of therapies, advanced monitoring, review of radiographic studies and interpretation of complex data.   Critical Care Time devoted to patient care services, exclusive of separately billable procedures, described in this note is 39 minutes.  Star [place], PA-C [facility] 02/02/24 7:43 PM  Please see  [email or web address] for pager details.  From 7A-7P if no response, please call [phone] After hours, please call ELink [phone]

## 2024-02-02 NOTE — ED Notes (Signed)
 MD at bedside.

## 2024-02-02 NOTE — Progress Notes (Signed)
 Pt was transported to CT-2. No complications.

## 2024-02-02 NOTE — Progress Notes (Signed)
 eLink Physician-Brief Progress Note Patient Name: Mary Acosta DOB: [birth year] MRN: [id number]   Date of Service  02/02/2024  HPI/Events of Note  eICU Brief new admit note:  68 year old woman with past medical history of chronic pain with narcotic use, COPD, diabetes complicated by diabetic neuropathy, cirrhosis, obstructive sleep apnea (unable to tolerate CPAP), and polymyalgia rheumatica found in AMS, s/p narcan  by EMS, Intubated for airway protection. Dx: AHHRF. COPD exacerbation and pneumonia.    Data: LA 2.6, now at 1.2  Cr 1.2 7.29/56/343/Hg 17 EKG: atrial tachy, RBBB Trop 15. RVP pending. UA nitrite positive. Bacteria none and wbc neg. Cr 1.2, wbc at 13 K.  NH3 44.  Tox screen neg. ECHO: EF 75% from 2024. No PAH.  CT head: suspected right cerebellar ischemia.  CxR images seen. Right lower atelectasis vs pneumonia. NG going towards stomach, ET in place.    Camera: In synchrony with Vent.  500/5/20.  Propofol , fenta sedation. Still not responding as per RN discussion.   eICU Interventions  Follow ECHO, RVP, strep pneumonia in urine On SSI, goal to keep < 180 VAP bundle As per CCM notes: Neurology consultation requested, may need MRI brain for suspected cerebellar ischemia, neuro consultation.      Intervention Category Major Interventions: Respiratory failure - evaluation and management;Other: Evaluation Type: New Patient Evaluation  Rexann Catalan 02/02/2024, 10:04 PM

## 2024-02-03 ENCOUNTER — Inpatient Hospital Stay (HOSPITAL_COMMUNITY)

## 2024-02-03 DIAGNOSIS — R9431 Abnormal electrocardiogram [ECG] [EKG]: Secondary | ICD-10-CM | POA: Diagnosis not present

## 2024-02-03 DIAGNOSIS — J9622 Acute and chronic respiratory failure with hypercapnia: Secondary | ICD-10-CM

## 2024-02-03 DIAGNOSIS — G4733 Obstructive sleep apnea (adult) (pediatric): Secondary | ICD-10-CM

## 2024-02-03 DIAGNOSIS — E119 Type 2 diabetes mellitus without complications: Secondary | ICD-10-CM

## 2024-02-03 DIAGNOSIS — J9621 Acute and chronic respiratory failure with hypoxia: Principal | ICD-10-CM

## 2024-02-03 DIAGNOSIS — G319 Degenerative disease of nervous system, unspecified: Secondary | ICD-10-CM | POA: Diagnosis not present

## 2024-02-03 DIAGNOSIS — J324 Chronic pansinusitis: Secondary | ICD-10-CM | POA: Diagnosis not present

## 2024-02-03 DIAGNOSIS — G9340 Encephalopathy, unspecified: Secondary | ICD-10-CM | POA: Diagnosis not present

## 2024-02-03 LAB — BASIC METABOLIC PANEL WITH GFR
Anion gap: 16 — ABNORMAL HIGH (ref 5–15)
BUN: 17 mg/dL (ref 8–23)
CO2: 23 mmol/L (ref 22–32)
Calcium: 9.9 mg/dL (ref 8.9–10.3)
Chloride: 101 mmol/L (ref 98–111)
Creatinine, Ser: 1.32 mg/dL — ABNORMAL HIGH (ref 0.44–1.00)
GFR, Estimated: 44 mL/min — ABNORMAL LOW (ref >60)
Glucose, Bld: 249 mg/dL — ABNORMAL HIGH (ref 70–99)
Potassium: 4 mmol/L (ref 3.5–5.1)
Sodium: 140 mmol/L (ref 135–145)

## 2024-02-03 LAB — MRSA NEXT GEN BY PCR, NASAL: MRSA by PCR Next Gen: DETECTED — AB

## 2024-02-03 LAB — ECHOCARDIOGRAM COMPLETE
AR max vel: 5.57 cm2
AV Area VTI: 4.49 cm2
AV Area mean vel: 5.49 cm2
AV Mean grad: 2 mmHg
AV Peak grad: 5.4 mmHg
Ao pk vel: 1.16 m/s
Area-P 1/2: 5.58 cm2
Calc EF: 78.4 %
Est EF: >75
Height: 67 in
Single Plane A2C EF: 77 %
Single Plane A4C EF: 79.9 %
Weight: 4673.75 [oz_av]

## 2024-02-03 LAB — HIV ANTIBODY (ROUTINE TESTING W REFLEX): HIV Screen 4th Generation wRfx: NONREACTIVE

## 2024-02-03 LAB — MAGNESIUM
Magnesium: 1.7 mg/dL (ref 1.7–2.4)
Magnesium: 2 mg/dL (ref 1.7–2.4)

## 2024-02-03 LAB — PHOSPHORUS
Phosphorus: 3.5 mg/dL (ref 2.5–4.6)
Phosphorus: 5.1 mg/dL — ABNORMAL HIGH (ref 2.5–4.6)

## 2024-02-03 LAB — T4, FREE: Free T4: 1.33 ng/dL — ABNORMAL HIGH (ref 0.61–1.12)

## 2024-02-03 LAB — GLUCOSE, CAPILLARY
Glucose-Capillary: 212 mg/dL — ABNORMAL HIGH (ref 70–99)
Glucose-Capillary: 231 mg/dL — ABNORMAL HIGH (ref 70–99)
Glucose-Capillary: 244 mg/dL — ABNORMAL HIGH (ref 70–99)
Glucose-Capillary: 251 mg/dL — ABNORMAL HIGH (ref 70–99)
Glucose-Capillary: 265 mg/dL — ABNORMAL HIGH (ref 70–99)
Glucose-Capillary: 274 mg/dL — ABNORMAL HIGH (ref 70–99)
Glucose-Capillary: 342 mg/dL — ABNORMAL HIGH (ref 70–99)

## 2024-02-03 LAB — CBC
HCT: 51.3 % — ABNORMAL HIGH (ref 36.0–46.0)
Hemoglobin: 15.8 g/dL — ABNORMAL HIGH (ref 12.0–15.0)
MCH: 24.1 pg — ABNORMAL LOW (ref 26.0–34.0)
MCHC: 30.8 g/dL (ref 30.0–36.0)
MCV: 78.2 fL — ABNORMAL LOW (ref 80.0–100.0)
Platelets: 162 10*3/uL (ref 150–400)
RBC: 6.56 MIL/uL — ABNORMAL HIGH (ref 3.87–5.11)
RDW: 16.5 % — ABNORMAL HIGH (ref 11.5–15.5)
WBC: 12.3 10*3/uL — ABNORMAL HIGH (ref 4.0–10.5)
nRBC: 0 % (ref 0.0–0.2)

## 2024-02-03 LAB — TSH: TSH: 0.111 u[IU]/mL — ABNORMAL LOW (ref 0.350–4.500)

## 2024-02-03 LAB — BRAIN NATRIURETIC PEPTIDE: B Natriuretic Peptide: 31.3 pg/mL (ref 0.0–100.0)

## 2024-02-03 LAB — TRIGLYCERIDES: Triglycerides: 178 mg/dL — ABNORMAL HIGH (ref <150)

## 2024-02-03 LAB — PROCALCITONIN: Procalcitonin: <0.1 ng/mL

## 2024-02-03 LAB — LACTIC ACID, PLASMA: Lactic Acid, Venous: 3.7 mmol/L (ref 0.5–1.9)

## 2024-02-03 MED ORDER — NOREPINEPHRINE 4 MG/250ML-% IV SOLN: 0.0000 ug/min | INTRAVENOUS | Status: DC

## 2024-02-03 MED ORDER — INSULIN GLARGINE-YFGN 100 UNIT/ML ~~LOC~~ SOLN
20.0000 [IU] | Freq: Every day | SUBCUTANEOUS | Status: DC
  Administered 2024-02-03: 20 [IU] via SUBCUTANEOUS
  Filled 2024-02-03 (×2): qty 0.2

## 2024-02-03 MED ORDER — LEVOTHYROXINE SODIUM 75 MCG PO TABS
175.0000 ug | ORAL_TABLET | Freq: Every day | ORAL | Status: DC
  Administered 2024-02-04 – 2024-02-05 (×2): 175 ug
  Filled 2024-02-03 (×2): qty 1

## 2024-02-03 MED ORDER — MUPIROCIN 2 % EX OINT
1.0000 | TOPICAL_OINTMENT | Freq: Two times a day (BID) | CUTANEOUS | Status: AC
  Administered 2024-02-03 – 2024-02-07 (×10): 1 via NASAL
  Filled 2024-02-03 (×3): qty 22

## 2024-02-03 MED ORDER — LACTULOSE 10 GM/15ML PO SOLN
20.0000 g | Freq: Two times a day (BID) | ORAL | Status: DC
  Administered 2024-02-03 – 2024-02-04 (×3): 20 g
  Filled 2024-02-03 (×3): qty 30

## 2024-02-03 MED ORDER — PROSOURCE TF20 ENFIT COMPATIBL EN LIQD
60.0000 mL | Freq: Two times a day (BID) | ENTERAL | Status: DC
  Administered 2024-02-03 – 2024-02-04 (×3): 60 mL
  Filled 2024-02-03 (×3): qty 60

## 2024-02-03 MED ORDER — NOREPINEPHRINE 4 MG/250ML-% IV SOLN
0.0000 ug/min | INTRAVENOUS | Status: DC
  Administered 2024-02-03: 2 ug/min via INTRAVENOUS
  Filled 2024-02-03: qty 250

## 2024-02-03 MED ORDER — FUROSEMIDE 10 MG/ML IJ SOLN
60.0000 mg | Freq: Once | INTRAMUSCULAR | Status: AC
  Administered 2024-02-03: 60 mg via INTRAVENOUS
  Filled 2024-02-03: qty 6

## 2024-02-03 MED ORDER — INSULIN ASPART 100 UNIT/ML IJ SOLN: 3.0000 [IU] | INTRAMUSCULAR | Status: DC

## 2024-02-03 MED ORDER — VANCOMYCIN HCL IN DEXTROSE 1-5 GM/200ML-% IV SOLN: 1000.0000 mg | Freq: Two times a day (BID) | INTRAVENOUS | Status: DC

## 2024-02-03 MED ORDER — PERFLUTREN LIPID MICROSPHERE
1.0000 mL | INTRAVENOUS | Status: AC | PRN
  Administered 2024-02-03: 3 mL via INTRAVENOUS

## 2024-02-03 MED ORDER — LACTATED RINGERS IV BOLUS
500.0000 mL | Freq: Once | INTRAVENOUS | Status: AC
  Administered 2024-02-03: 500 mL via INTRAVENOUS

## 2024-02-03 MED ORDER — MIDODRINE HCL 5 MG PO TABS
5.0000 mg | ORAL_TABLET | Freq: Three times a day (TID) | ORAL | Status: DC
  Administered 2024-02-03 – 2024-02-04 (×2): 5 mg
  Filled 2024-02-03 (×2): qty 1

## 2024-02-03 MED ORDER — MAGNESIUM SULFATE 2 GM/50ML IV SOLN
2.0000 g | Freq: Once | INTRAVENOUS | Status: AC
  Administered 2024-02-03: 2 g via INTRAVENOUS
  Filled 2024-02-03: qty 50

## 2024-02-03 MED ORDER — VITAL 1.5 CAL PO LIQD
1000.0000 mL | ORAL | Status: DC
  Administered 2024-02-03: 1000 mL

## 2024-02-03 MED ORDER — LEVOTHYROXINE SODIUM 75 MCG PO TABS: 150.0000 ug | ORAL_TABLET | Freq: Every day | ORAL | Status: DC

## 2024-02-03 MED ORDER — VANCOMYCIN HCL 1.25 G IV SOLR
1250.0000 mg | INTRAVENOUS | Status: DC
  Administered 2024-02-03: 1250 mg via INTRAVENOUS
  Filled 2024-02-03 (×2): qty 25

## 2024-02-03 MED ORDER — SODIUM CHLORIDE 0.9 % IV SOLN: 250.0000 mL | INTRAVENOUS | Status: AC

## 2024-02-03 MED ORDER — GABAPENTIN 250 MG/5ML PO SOLN
300.0000 mg | Freq: Two times a day (BID) | ORAL | Status: DC
  Administered 2024-02-03 – 2024-02-04 (×3): 300 mg
  Filled 2024-02-03 (×7): qty 6

## 2024-02-03 NOTE — Progress Notes (Addendum)
 Called by RN due to hypotension: MAP 54 mmHg, SBP 113/36 Other V/S: afebrile, sinus 86, SpO2 99%, RR 20 PRVC 20/500/+5/40% No air trapping No biting the ETT Sedation: Fentanyl  75 mcg/hr and Propofol  20 mcg/kg/min P/E: Lungs: clear, symmetrical air entry Heart: NL S1/S2, no g/c/r Abd: no distension LL: trace edema W/u: Echo: EF >75%, ?diastolic dysfunction  PCXR: RLL infilrates, ? Pleural effusion Assessment: Acute hypoxic hypercapnic resp failure ?ECOPD/PNA Possible diastolic heart failure OSA Shock: sedative medication SE?, d.dx: septic  CKD-3a IDDM: poorly controlled PLAN: Small Levophed  Add Midodrine Lactic acidosis PCT I/O chart Already on Abx Add Vanco Will monitor: ?CVC and a-line Increase ISS  CCM 35 min  Madelynn Schilder, MD [place] [facility]

## 2024-02-03 NOTE — Progress Notes (Signed)
 Initial Nutrition Assessment  DOCUMENTATION CODES: Obesity unspecified  INTERVENTION:  Tube feeds via OG-tube: Start Vital 1.5 at 20 mL/hr and advance by 10 mL every 6 hours to goal rate of 50 mL/hr (1200 mL per day) 60 mL ProSource TF20 - BID Regimen at goal provides 1960 calories, 121 gm protein, and 917 mL free water  daily.   NUTRITION DIAGNOSIS:  Inadequate oral intake related to inability to eat as evidenced by NPO status.  GOAL:  Patient will meet greater than or equal to 90% of their needs  MONITOR:  Vent status, Labs, I & O's, TF tolerance  REASON FOR ASSESSMENT:  Consult Enteral/tube feeding initiation and management  ASSESSMENT:  68 y.o. female presented to the ED with dyspnea. PMH includes COPD, CHF, gastroparesis, HLD, gout, T2DM, cirrhosis, HTN, and lap band/vagotomy surgery in 2008. Pt admitted with acute respiratory failure and acute encephalopathy.   6/09 - Admitted; Intubated  No family at bedside. Discussed with RN, plan to go to MRI soon. ECHO being completed at time of RD visit. Discussed starting tube feed with CCM, MD agreeable.   Per EMR, pt with a 9% weight loss within the past 11 months, this is not clinically significant for time frame.   Patient is currently intubated on ventilator support. MV: 9.9 L/min MAP (cuff): 99 Temp (24hrs), Avg:99.4 F (37.4 C), Min:98.1 F (36.7 C), Max:99.9 F (37.7 C)  Nutrition Related Medications: Colace, NovoLog  0-9 units q4h, Lactulose, Protonix, Miralax , IV antibiotics  Drips Fentanyl  Propofol  16.8 mL/hr (444 kcal/day) Labs: Sodium 140, Potassium 4.0, BUN 17, Creatinine 1.32, Phosphorus 3.5, Magnesium  1.7, Triglycerides 178, Hgb A1c 11.5 (11/19/23)  CBG: 212-274 mg/dL x 24 hrs    NUTRITION - FOCUSED PHYSICAL EXAM:  Flowsheet Row Most Recent Value  Orbital Region No depletion  Upper Arm Region No depletion  Thoracic and Lumbar Region No depletion  Buccal Region Unable to assess  Temple Region No depletion   Clavicle Bone Region No depletion  Clavicle and Acromion Bone Region No depletion  Scapular Bone Region No depletion  Dorsal Hand Unable to assess  Patellar Region Unable to assess  Anterior Thigh Region Unable to assess  Posterior Calf Region No depletion  Edema (RD Assessment) Mild  Hair Reviewed  Eyes Unable to assess  Mouth Unable to assess  Skin Reviewed  Nails Unable to assess   Diet Order:   Diet Order             Diet NPO time specified  Diet effective now                  EDUCATION NEEDS: Not appropriate for education at this time  Skin:  Skin Assessment: Reviewed RN Assessment  Last BM:  Unknown  Height:  Ht Readings from Last 1 Encounters:  02/02/24 5\' 7"  (1.702 m)   Weight:  Wt Readings from Last 1 Encounters:  02/03/24 132.5 kg   Ideal Body Weight:  61.4 kg  BMI:  Body mass index is 45.75 kg/m.  Estimated Nutritional Needs:  Kcal:  1900-2100 Protein:  110-130 grams Fluid:  >/= 1.9 L   Doneta Furbish RD, LDN Clinical Dietitian

## 2024-02-03 NOTE — Inpatient Diabetes Management (Addendum)
 Inpatient Diabetes Program Recommendations  AACE/ADA: New Consensus Statement on Inpatient Glycemic Control   Target Ranges:  Prepandial:   less than 140 mg/dL      Peak postprandial:   less than 180 mg/dL (1-2 hours)      Critically ill patients:  140 - 180 mg/dL    Latest Reference Range & Units 02/03/24 04:22 02/03/24 07:50 02/03/24 11:19  Glucose-Capillary 70 - 99 mg/dL 235 (H) 573 (H) 220 (H)    Latest Reference Range & Units 02/02/24 21:31 02/02/24 23:58  Glucose-Capillary 70 - 99 mg/dL 254 (H) 270 (H)   Review of Glycemic Control  Diabetes history: DM2 Outpatient Diabetes medications: Tresiba  80 units daily, Metformin  500 mg BID, Ozempic  1 mg Qweek Current orders for Inpatient glycemic control: Novolog  0-9 units Q4H  Inpatient Diabetes Program Recommendations:    Insulin : CBGs 212-274 mg/dl over the past 12 hours.  Please consider ordering Semglee  25 units Q24H (based on 132.5 kg x 0.2 units) and increase Novolog  correction to 0-15 units Q4H.  NOTE: Patient admitted with respiratory failure, pneumonia, and acute encephalopathy. In reviewing chart, noted patient sees Dr. Aldona Amel and was last seen on 11/19/23. Per office note on 11/19/23 patient was instructed to:  Please continue: - Metformin  500 mg 2x a day - Tresiba  80 units daily - Ozempic  1 mg weekly or every 1.5 weekly   Use: - Lyumjev : 40 units before a smaller meal 50 units before a regular meal 60-70 units before a large meal You may need to take 10-15 units of Lyumjev  insulin  only for correction.   Try to start the Dexcom CGM. Patient is currently intubated and on ventilator. Humulin  R U500 insulin  and Lyumjev  are not listed on current home medication list.  Patient is only ordered Novolog  correction at this time.  Thanks, Beacher Limerick, RN, MSN, CDCES Diabetes Coordinator Inpatient Diabetes Program [phone] (Team Pager from 8am to 5pm)

## 2024-02-03 NOTE — Progress Notes (Signed)
 Pharmacy Antibiotic Note  Mary Acosta is a 68 y.o. female admitted on 02/02/2024 with pneumonia.  Pharmacy has been consulted for vancomycin  dosing.  Plan: Vancomycin  1250mg  IV q24h for estimated AUC 501 using SCr 1.32, Vd 0.5 Check vancomycin  levels at steady state, goal AUC 400-550 Follow up renal function, cultures as available, clinical progress, length of tx  Height: 5\' 7"  (170.2 cm) Weight: 132.5 kg (292 lb 1.8 oz) IBW/kg (Calculated) : 61.6  Temp (24hrs), Avg:99.6 F (37.6 C), Min:98.1 F (36.7 C), Max:100 F (37.8 C)  Recent Labs  Lab 02/02/24 1700 02/02/24 1800 02/02/24 2007 02/03/24 0951  WBC 13.0*  --   --  12.3*  CREATININE 1.04* 1.20*  --  1.32*  LATICACIDVEN  --  2.6* 1.2  --     Estimated Creatinine Clearance: 58.8 mL/min (A) (by C-G formula based on SCr of 1.32 mg/dL (H)).    Allergies  Allergen Reactions   Gabapentin  Swelling    Swelling in legs    Losartan Other (See Comments)    Myalgias and muscle cramping    Aricept  [Donepezil  Hcl]     Nausea/vomiting, low BP   Aripiprazole Other (See Comments)    Patient reports Hypoglycemia   Hydroxyzine      hallucinations   Oxycodone-Acetaminophen  Itching   Oxycodone Itching   Sulfa Antibiotics Nausea Only and Rash   Sulfonamide Derivatives Nausea Only and Rash    Antimicrobials this admission: 6/9 Ceftriaxone >> 6/9 Doxycycline  >> 6/10 Vancomycin  >>  Dose adjustments this admission:  Microbiology results: 6/9 BCx: ngtd 6/9 MRSA PCR: positive  Thank you for allowing pharmacy to be a part of this patient's care.  Armanda Bern, PharmD, BCPS 02/03/2024 9:19 PM

## 2024-02-03 NOTE — Progress Notes (Signed)
*  PRELIMINARY RESULTS* Echocardiogram 2D Echocardiogram has been performed.  Mary Acosta 02/03/2024, 11:27 AM

## 2024-02-03 NOTE — Progress Notes (Signed)
 NAME:  Mary Acosta, MRN:  [id number], DOB:  06/20/1956, LOS: 1 ADMISSION DATE:  02/02/2024, CONSULTATION DATE: 6/9 REFERRING MD: Dr. Daivd Dub EDP, CHIEF COMPLAINT: Dyspnea  History of Present Illness:  68 year old woman with past medical history as below, which is significant for chronic pain with narcotic use, COPD, diabetes complicated by diabetic neuropathy, cirrhosis, obstructive sleep apnea (unable to tolerate CPAP), and polymyalgia rheumatica.    Patient is intubated; therefore, history has been obtained from chart review. EMS was called to the house 6/9 for patient having labored respirations and poor responsiveness.  Upon EMS arrival the patient immediately required bag-valve-mask assist ventilations.  EMS administered Narcan  without effect.  With assisted ventilations the patient did gain some level of responsiveness although remained quite poor.  The patient remained dyspneic and poorly responsive upon arrival to the emergency department she was intubated.  Chest x-ray concerning for vascular congestion.  Laboratory evaluation demonstrated hypercapnia.   PCCM asked to admit for respiratory failure.  Pertinent Medical History:   Past Medical History:  Diagnosis Date   AKI (acute kidney injury) (HCC) 01/2017   Allergy    seasonal   Anxiety    with panic attacks   Arthritis    "back; feet; hands; shoulders" (08/26/2014)   Asthma    Cataract    forming   Cervical cancer (HCC)    Chronic lower back pain    Chronic narcotic use    Chronic pain syndrome    [facility]   Cirrhosis (HCC)    Clostridium difficile infection 2017   COPD (chronic obstructive pulmonary disease) (HCC)    Daily headache    past hx   Depression    Diabetic neuropathy (HCC) 06/04/2017   feet  and legs , some in hands   DJD (degenerative joint disease)    Fatty liver disease, nonalcoholic    Fibromyalgia    Frequency of urination    HCAP (healthcare-associated pneumonia)  08/26/2014   History of TIA (transient ischemic attack) 11/01/2010   NO RESIDUAL   Hyperlipidemia    Hypertension    Hypothyroidism    IDDM (insulin  dependent diabetes mellitus)    Insomnia    Lumbar stenosis    Macular degeneration    Memory difficulty 07/25/2016   Nocturia    OSA (obstructive sleep apnea)    NO CPAP SINCE WT LOSS   Osteoarthritis    with severe disease in knee   Oxygen  deficiency    4  liters at bedtime only   Pneumonia "several times"   Polymyalgia rheumatica (HCC)    Scoliosis    Seasonal allergies    Stroke (HCC)    TIA   Thyroid  cancer (HCC)    Urgency of urination    Vaginal pain S/P SLING  FEB 2012   Significant Hospital Events: Including procedures, antibiotic start and stop dates in addition to other pertinent events   6/9 - Presented to [facility] ED via EMS for dyspnea, respiratory failure. Intubated in ED. Hypercapnic and hypoxic on gas. Received Narcan  to ensure no component of narcosis. PCCM consulted for ICU admission.  Interim History / Subjective:  Overnight no acute events.  Remains intubated, sedated.  Objective:   Blood pressure (!) 148/73, pulse 89, temperature 99.9 F (37.7 C), resp. rate 20, height 5\' 7"  (1.702 m), weight 132.5 kg, SpO2 100%.    Vent Mode: PRVC FiO2 (%):  [40 %-100 %] 40 % Set Rate:  [15 bmp-20 bmp] 20 bmp  Vt Set:  [500 mL] 500 mL PEEP:  [5 cmH20] 5 cmH20 Plateau Pressure:  [17 cmH20-19 cmH20] 17 cmH20   Intake/Output Summary (Last 24 hours) at 02/03/2024 0733 Last data filed at 02/03/2024 0700 Gross per 24 hour  Intake 1149.2 ml  Output 875 ml  Net 274.2 ml   Filed Weights   02/02/24 2006 02/03/24 0205  Weight: 136 kg 132.5 kg    Physical Examination: General: Critically ill-appearing woman intubated, sedated HEENT: [place]/AT, eyes anicteric, endotracheal tube in place Neuro: RASS -5  CV: S1-S2, regular rate and rhythm PULM: Breathing comfortably on MV, very diminished basilar breath sounds.  Tan secretions  from endotracheal tube GI: Obese, soft, tender, nondistended Extremities: + Edema Skin: Stasis dermatitis bilateral lower extremities  BUN 17 Creatinine 1.32 Phos 3.5 Magnesium  1.7 WBC 12.3 H/H 15.8/51.3 Platelets 162 CXR personally reviewed-right lateral infiltrate silhouetting hemidiaphragm, endotracheal tube 3.5 cm above the carina CT head-concern for right cerebellar ischemia  Resolved problem List:   Assessment and Plan:   Acute on chronic hypoxic and hypercarbic respiratory failure mechanical ventilation Decompensated OSA, ?OHS (not on CPAP) History of COPD on nocturnal O2 Right lower lobe pneumonia -LTVV - VAP prevention protocol - PAD protocol for sedation - Daily SAT and SBT as appropriate - Collect trach aspirate culture - Lasix  - Continue CAP antibiotics  Acute encephalopathy, concern for area of ischemia in right cerebellum on CT History of TIA -Brain MRI today - Do not see any notes to suggest that Neuro was consulted; will follow-up pending MRI results and if this is still needed. - Neuroprotective measures: HOB > 30 degrees, normoglycemia, normothermia, electrolytes WNL  HTN HLD - Echo ordered - Continue PTA statin - Will hold PTA antihypertensives for now pending results of brain MRI.  May be indicated to have progressive hypertension for now  Hypothyroidism; h/o thyroid  cancer s/p resection -Continue PTA Synthroid ; appropriate with her history to have TSH suppression and high free T4  DM- uncontrolled, A1c 11.5 - Hold PTA metformin , semaglutide  -Start glargine 20 units daily (1/4 PTA dose) - Sliding scale insulin  as needed - Goal blood glucose 140-180  NAFLD -Supportive care, long-term recommend weight loss  Chronic low back pain Chronic narcotic & benzo use Polymyalgia rheumatica Fibromyalgia -Continue holding PTA Humira - Hold PTA clonazepam , Strattera, Paxil , tizanidine  -Resume reduced rate of PTA gabapentin  for withdrawal  Anxiety -  Resume home Paxil  as appropriate  Lactic acidosis resolved; not sure if this was sepsis or from hypoxia PTA -no additional montitoring needed   Daughter updated via phone; she answered her father's phone.   Best Practice (right click and "Reselect all SmartList Selections" daily)   Diet/type: NPO DVT prophylaxis SCDs, SQH GI prophylaxis: PPI Lines: N/A Foley:  Yes, and it is still needed Code Status:  full code Last date of multidisciplinary goals of care discussion [Pending]  Labs   CBC: Recent Labs  Lab 02/02/24 1700 02/02/24 1751 02/02/24 1800 02/02/24 2332  WBC 13.0*  --   --   --   NEUTROABS 10.1*  --   --   --   HGB 15.8* 15.6* 17.0*  17.7* 15.0  HCT 51.4* 46.0 50.0*  52.0* 44.0  MCV 79.1*  --   --   --   PLT 167  --   --   --    Basic Metabolic Panel: Recent Labs  Lab 02/02/24 1700 02/02/24 1751 02/02/24 1800 02/02/24 2332  NA 140 140 141  140 139  K 4.6 4.0  4.8  4.8 4.1  CL 99  --  102  --   CO2 25  --   --   --   GLUCOSE 155*  --  163*  --   BUN 17  --  24*  --   CREATININE 1.04*  --  1.20*  --   CALCIUM  9.8  --   --   --    GFR: Estimated Creatinine Clearance: 64.6 mL/min (A) (by C-G formula based on SCr of 1.2 mg/dL (H)). Recent Labs  Lab 02/02/24 1700 02/02/24 1800 02/02/24 2007  WBC 13.0*  --   --   LATICACIDVEN  --  2.6* 1.2   Liver Function Tests: Recent Labs  Lab 02/02/24 1700  AST 30  ALT 27  ALKPHOS 75  BILITOT 1.3*  PROT 7.5  ALBUMIN 3.9   No results for input(s): "LIPASE", "AMYLASE" in the last 168 hours. Recent Labs  Lab 02/02/24 1712  AMMONIA 44*    Critical care time:    This patient is critically ill with multiple organ system failure which requires frequent high complexity decision making, assessment, support, evaluation, and titration of therapies. This was completed through the application of advanced monitoring technologies and extensive interpretation of multiple databases. During this encounter critical  care time was devoted to patient care services described in this note for 40 minutes.  Joesph Mussel, DO 02/03/24 12:56 PM [facility]  For contact information, see Amion. If no response to pager, please call PCCM consult pager. After hours, 7PM- 7AM, please call Elink.

## 2024-02-04 ENCOUNTER — Inpatient Hospital Stay (HOSPITAL_COMMUNITY)

## 2024-02-04 DIAGNOSIS — E785 Hyperlipidemia, unspecified: Secondary | ICD-10-CM

## 2024-02-04 DIAGNOSIS — I10 Essential (primary) hypertension: Secondary | ICD-10-CM

## 2024-02-04 DIAGNOSIS — J189 Pneumonia, unspecified organism: Secondary | ICD-10-CM

## 2024-02-04 LAB — BASIC METABOLIC PANEL WITH GFR
Anion gap: 12 (ref 5–15)
BUN: 24 mg/dL — ABNORMAL HIGH (ref 8–23)
CO2: 24 mmol/L (ref 22–32)
Calcium: 8.9 mg/dL (ref 8.9–10.3)
Chloride: 100 mmol/L (ref 98–111)
Creatinine, Ser: 1.27 mg/dL — ABNORMAL HIGH (ref 0.44–1.00)
GFR, Estimated: 46 mL/min — ABNORMAL LOW (ref >60)
Glucose, Bld: 365 mg/dL — ABNORMAL HIGH (ref 70–99)
Potassium: 3.6 mmol/L (ref 3.5–5.1)
Sodium: 136 mmol/L (ref 135–145)

## 2024-02-04 LAB — CBC WITH DIFFERENTIAL/PLATELET
Abs Immature Granulocytes: 0.08 10*3/uL — ABNORMAL HIGH (ref 0.00–0.07)
Basophils Absolute: 0.1 10*3/uL (ref 0.0–0.1)
Basophils Relative: 1 %
Eosinophils Absolute: 0.2 10*3/uL (ref 0.0–0.5)
Eosinophils Relative: 2 %
HCT: 46 % (ref 36.0–46.0)
Hemoglobin: 14.5 g/dL (ref 12.0–15.0)
Immature Granulocytes: 1 %
Lymphocytes Relative: 19 %
Lymphs Abs: 2.7 10*3/uL (ref 0.7–4.0)
MCH: 24.3 pg — ABNORMAL LOW (ref 26.0–34.0)
MCHC: 31.5 g/dL (ref 30.0–36.0)
MCV: 77.2 fL — ABNORMAL LOW (ref 80.0–100.0)
Monocytes Absolute: 1.7 10*3/uL — ABNORMAL HIGH (ref 0.1–1.0)
Monocytes Relative: 12 %
Neutro Abs: 9.7 10*3/uL — ABNORMAL HIGH (ref 1.7–7.7)
Neutrophils Relative %: 65 %
Platelets: 223 10*3/uL (ref 150–400)
RBC: 5.96 MIL/uL — ABNORMAL HIGH (ref 3.87–5.11)
RDW: 16.1 % — ABNORMAL HIGH (ref 11.5–15.5)
WBC: 14.5 10*3/uL — ABNORMAL HIGH (ref 4.0–10.5)
nRBC: 0 % (ref 0.0–0.2)

## 2024-02-04 LAB — MAGNESIUM
Magnesium: 1.7 mg/dL (ref 1.7–2.4)
Magnesium: 1.9 mg/dL (ref 1.7–2.4)

## 2024-02-04 LAB — GLUCOSE, CAPILLARY
Glucose-Capillary: 233 mg/dL — ABNORMAL HIGH (ref 70–99)
Glucose-Capillary: 248 mg/dL — ABNORMAL HIGH (ref 70–99)
Glucose-Capillary: 285 mg/dL — ABNORMAL HIGH (ref 70–99)
Glucose-Capillary: 285 mg/dL — ABNORMAL HIGH (ref 70–99)
Glucose-Capillary: 313 mg/dL — ABNORMAL HIGH (ref 70–99)
Glucose-Capillary: 330 mg/dL — ABNORMAL HIGH (ref 70–99)
Glucose-Capillary: 343 mg/dL — ABNORMAL HIGH (ref 70–99)

## 2024-02-04 LAB — PHOSPHORUS
Phosphorus: 4.7 mg/dL — ABNORMAL HIGH (ref 2.5–4.6)
Phosphorus: 4.6 mg/dL (ref 2.5–4.6)

## 2024-02-04 LAB — LACTIC ACID, PLASMA: Lactic Acid, Venous: 2 mmol/L (ref 0.5–1.9)

## 2024-02-04 MED ORDER — INSULIN GLARGINE-YFGN 100 UNIT/ML ~~LOC~~ SOLN
30.0000 [IU] | Freq: Every day | SUBCUTANEOUS | Status: DC
  Administered 2024-02-04: 30 [IU] via SUBCUTANEOUS
  Filled 2024-02-04 (×2): qty 0.3

## 2024-02-04 MED ORDER — METOPROLOL TARTRATE 5 MG/5ML IV SOLN
5.0000 mg | INTRAVENOUS | Status: DC | PRN
  Administered 2024-02-04: 5 mg via INTRAVENOUS
  Filled 2024-02-04: qty 5

## 2024-02-04 MED ORDER — INSULIN ASPART 100 UNIT/ML IJ SOLN
0.0000 [IU] | Freq: Three times a day (TID) | INTRAMUSCULAR | Status: DC
  Administered 2024-02-04 (×2): 11 [IU] via SUBCUTANEOUS
  Administered 2024-02-04 – 2024-02-05 (×2): 8 [IU] via SUBCUTANEOUS
  Administered 2024-02-05: 11 [IU] via SUBCUTANEOUS
  Administered 2024-02-05: 8 [IU] via SUBCUTANEOUS
  Administered 2024-02-06: 11 [IU] via SUBCUTANEOUS
  Administered 2024-02-06: 5 [IU] via SUBCUTANEOUS
  Administered 2024-02-06: 2 [IU] via SUBCUTANEOUS
  Administered 2024-02-07: 3 [IU] via SUBCUTANEOUS
  Administered 2024-02-07: 8 [IU] via SUBCUTANEOUS
  Administered 2024-02-08: 3 [IU] via SUBCUTANEOUS

## 2024-02-04 MED ORDER — CLEVIDIPINE BUTYRATE 0.5 MG/ML IV EMUL
0.0000 mg/h | INTRAVENOUS | Status: DC
  Administered 2024-02-04 (×2): 21 mg/h via INTRAVENOUS
  Administered 2024-02-04: 2 mg/h via INTRAVENOUS
  Administered 2024-02-04 – 2024-02-05 (×6): 21 mg/h via INTRAVENOUS
  Administered 2024-02-05: 2 mg/h via INTRAVENOUS
  Administered 2024-02-05: 21 mg/h via INTRAVENOUS
  Administered 2024-02-05: 6 mg/h via INTRAVENOUS
  Administered 2024-02-05: 21 mg/h via INTRAVENOUS
  Administered 2024-02-06: 2 mg/h via INTRAVENOUS
  Filled 2024-02-04 (×13): qty 100

## 2024-02-04 MED ORDER — ORAL CARE MOUTH RINSE
15.0000 mL | Freq: Four times a day (QID) | OROMUCOSAL | Status: DC
  Administered 2024-02-04 – 2024-02-06 (×7): 15 mL via OROMUCOSAL

## 2024-02-04 MED ORDER — NOREPINEPHRINE 4 MG/250ML-% IV SOLN
0.0000 ug/min | INTRAVENOUS | Status: DC
  Administered 2024-02-04: 12 ug/min via INTRAVENOUS
  Filled 2024-02-04: qty 250

## 2024-02-04 MED ORDER — INSULIN GLARGINE-YFGN 100 UNIT/ML ~~LOC~~ SOLN
10.0000 [IU] | Freq: Once | SUBCUTANEOUS | Status: AC
  Administered 2024-02-04: 10 [IU] via SUBCUTANEOUS
  Filled 2024-02-04: qty 0.1

## 2024-02-04 MED ORDER — INSULIN GLARGINE 100 UNIT/ML ~~LOC~~ SOLN
10.0000 [IU] | Freq: Once | SUBCUTANEOUS | Status: DC
  Filled 2024-02-04: qty 0.1

## 2024-02-04 NOTE — TOC Initial Note (Signed)
 Transition of Care [facility]) - Initial/Assessment Note    Patient Details  Name: Mary Acosta MRN: [id number] Date of Birth: August 09, 1956  Transition of Care [facility]) CM/SW Contact:    Cosimo Diones, RN Phone Number: 02/04/2024, 2:38 PM  Clinical Narrative: Patient presented for dyspnea-extubated today. Daughter Mary Acosta at the bedside and states patient was from home with spouse. Daughter states patient has DME rolling walker, rollator, and she could not remember if the patient still has a CPAP. Daughter states she takes the patient to appointments and she obtains medications without any issues and uses a pill box. PT/OT has been ordered for recommendations. Case Manager will continue to follow for transition of care needs as the patient progresses.                      Expected Discharge Plan:  (TBD) Barriers to Discharge: Continued Medical Work up   Patient Goals and CMS Choice     Choice offered to / list presented to : NA     Expected Discharge Plan and Services   Discharge Planning Services: CM Consult   Living arrangements for the past 2 months: Single Family Home                                      Prior Living Arrangements/Services Living arrangements for the past 2 months: Single Family Home Lives with:: Spouse Patient language and need for interpreter reviewed:: Yes Do you feel safe going back to the place where you live?: Yes      Need for Family Participation in Patient Care: Yes (Comment)   Current home services: DME (rolling walker and rollator.) Criminal Activity/Legal Involvement Pertinent to Current Situation/Hospitalization: No - Comment as needed  Activities of Daily Living      Permission Sought/Granted Permission sought to share information with : Family Supports, Magazine features editor, Case Estate manager/land agent granted to share information with : Yes, Verbal Permission Granted              Emotional  Assessment Appearance:: Appears stated age Attitude/Demeanor/Rapport: Unable to Assess Affect (typically observed): Unable to Assess Orientation: : Oriented to Self Alcohol / Substance Use: Not Applicable Psych Involvement: No (comment)  Admission diagnosis:  Acute respiratory failure with hypoxia and hypercarbia (HCC) [J96.01, J96.02] Acute respiratory failure with hypoxia and hypercapnia (HCC) [J96.01, J96.02] Altered mental status, unspecified altered mental status type [R41.82] Patient Active Problem List   Diagnosis Date Noted   Acute respiratory failure with hypoxia and hypercarbia (HCC) 02/02/2024   Breast pain, right 01/13/2024   Bilateral primary osteoarthritis of knee 05/23/2023   Left upper quadrant abdominal pain 05/23/2023   Gout 07/24/2022   Bilateral back pain 03/27/2022   Acute cystitis 03/27/2022   Seronegative rheumatoid arthritis (HCC) 01/14/2022   On home oxygen  therapy 11/05/2021   Hallucinations 03/06/2021   Aortic atherosclerosis (HCC) 11/01/2020   Poorly controlled type 2 diabetes mellitus with circulatory disorder (HCC) 07/05/2020   PSVT (paroxysmal supraventricular tachycardia) (HCC) 05/31/2020   UTI (urinary tract infection) 03/01/2020   Osteoarthritis of right knee 10/28/2019   Closed fracture of right patella 08/18/2019   Cervical radiculopathy 07/12/2019   DDD (degenerative disc disease), cervical 07/12/2019   Grief 02/03/2019   Nausea 01/04/2019   Reactive airway disease 10/21/2018   Arthralgia 09/04/2018   Thyroid  cancer (HCC) 04/30/2018   Obesity hypoventilation syndrome (HCC) 09/24/2017  Palpitations 09/18/2017   Diabetic neuropathy (HCC) 06/04/2017   Vitamin B12 deficiency 03/05/2017   Numbness and tingling in both hands 02/24/2017   OSA (obstructive sleep apnea) 01/31/2017   Liver cirrhosis secondary to NASH (HCC) 01/31/2017   Hypothyroidism 01/31/2017   Chronic narcotic use 01/31/2017   Fibromyalgia 01/31/2017   Hyperlipidemia  01/31/2017   Myalgia 12/10/2016   Pain medication agreement signed 10/14/2016   Gastroparesis    Memory disorder 07/25/2016   Chronic diastolic (congestive) heart failure (HCC) 01/03/2016   Fatty liver 01/03/2016   Recurrent Clostridium difficile diarrhea 01/03/2016   Chronic respiratory failure (HCC) 11/10/2014   Physical deconditioning 09/26/2014   Dyspnea 09/26/2014   SOB (shortness of breath) 09/08/2014   Knee pain 11/04/2013   SI (sacroiliac) joint dysfunction 11/04/2013   Chronic, continuous use of opioids 09/14/2013   Iron deficiency anemia, unspecified  04/05/2011   Bariatric surgery status 04/05/2011   Vitamin D  deficiency 10/22/2007   LOW BACK PAIN, CHRONIC 10/22/2007   INSOMNIA 10/22/2007   Obesity 10/14/2007   Chronic pain syndrome 10/14/2007   CARPAL TUNNEL SYNDROME 10/14/2007   ALLERGIC RHINITIS CAUSE UNSPECIFIED 10/14/2007   ARTHRITIS 10/14/2007   Anxiety state 08/25/2007   Depression 08/25/2007   Essential hypertension 08/25/2007   ASTHMA 08/25/2007   CONSTIPATION 08/25/2007   POLYMYALGIA RHEUMATICA 08/25/2007   PCP:  Colene Dauphin, MD Pharmacy:   [facility] [phone] Jonette Nestle, [place] - [address] AT [facility] [address] [place] [address] Phone: [phone] Fax: [phone]  [facility] - [address], [place] - [address] [place] [address] Phone: [phone] Fax: [phone]  [facility] - [place]  - [place], [place] - [address] [place] [address] Phone: [phone] Fax: [phone]     Social Drivers of Health (SDOH) Social History: SDOH Screenings   Food Insecurity: No Food Insecurity (12/23/2023)  Housing: Unknown (12/23/2023)  Transportation Needs: No Transportation Needs (12/23/2023)  Utilities: Not At Risk (12/23/2023)  Alcohol Screen: Low Risk  (12/23/2023)  Depression (PHQ2-9): Medium Risk (12/23/2023)   Financial Resource Strain: Low Risk  (12/23/2023)  Physical Activity: Inactive (12/23/2023)  Social Connections: Moderately Isolated (12/23/2023)  Stress: Stress Concern Present (12/23/2023)  Tobacco Use: Medium Risk (02/02/2024)  Health Literacy: Adequate Health Literacy (12/23/2023)   SDOH Interventions:     Readmission Risk Interventions     No data to display

## 2024-02-04 NOTE — Progress Notes (Signed)
 NAME:  Mary Acosta, MRN:  [id number], DOB:  05-06-1956, LOS: 2 ADMISSION DATE:  02/02/2024, CONSULTATION DATE: 6/9 REFERRING MD: Dr. Daivd Dub EDP, CHIEF COMPLAINT: Dyspnea  History of Present Illness:  68 year old woman with past medical history as below, which is significant for chronic pain with narcotic use, COPD, diabetes complicated by diabetic neuropathy, cirrhosis, obstructive sleep apnea (unable to tolerate CPAP), and polymyalgia rheumatica.    Patient is intubated; therefore, history has been obtained from chart review. EMS was called to the house 6/9 for patient having labored respirations and poor responsiveness.  Upon EMS arrival the patient immediately required bag-valve-mask assist ventilations.  EMS administered Narcan  without effect.  With assisted ventilations the patient did gain some level of responsiveness although remained quite poor.  The patient remained dyspneic and poorly responsive upon arrival to the emergency department she was intubated.  Chest x-ray concerning for vascular congestion.  Laboratory evaluation demonstrated hypercapnia.   PCCM asked to admit for respiratory failure.  Pertinent Medical History:   Past Medical History:  Diagnosis Date   AKI (acute kidney injury) (HCC) 01/2017   Allergy    seasonal   Anxiety    with panic attacks   Arthritis    back; feet; hands; shoulders (08/26/2014)   Asthma    Cataract    forming   Cervical cancer (HCC)    Chronic lower back pain    Chronic narcotic use    Chronic pain syndrome    [facility]   Cirrhosis (HCC)    Clostridium difficile infection 2017   COPD (chronic obstructive pulmonary disease) (HCC)    Daily headache    past hx   Depression    Diabetic neuropathy (HCC) 06/04/2017   feet  and legs , some in hands   DJD (degenerative joint disease)    Fatty liver disease, nonalcoholic    Fibromyalgia    Frequency of urination    HCAP (healthcare-associated pneumonia)  08/26/2014   History of TIA (transient ischemic attack) 11/01/2010   NO RESIDUAL   Hyperlipidemia    Hypertension    Hypothyroidism    IDDM (insulin  dependent diabetes mellitus)    Insomnia    Lumbar stenosis    Macular degeneration    Memory difficulty 07/25/2016   Nocturia    OSA (obstructive sleep apnea)    NO CPAP SINCE WT LOSS   Osteoarthritis    with severe disease in knee   Oxygen  deficiency    4  liters at bedtime only   Pneumonia several times   Polymyalgia rheumatica (HCC)    Scoliosis    Seasonal allergies    Stroke (HCC)    TIA   Thyroid  cancer (HCC)    Urgency of urination    Vaginal pain S/P SLING  FEB 2012   Significant Hospital Events: Including procedures, antibiotic start and stop dates in addition to other pertinent events   6/9 - Presented to [facility] ED via EMS for dyspnea, respiratory failure. Intubated in ED. Hypercapnic and hypoxic on gas. Received Narcan  to ensure no component of narcosis. PCCM consulted for ICU admission.  Interim History / Subjective:  High BP this am with any sedation wean.  Objective:   Blood pressure (!) 180/68, pulse 84, temperature 97.8 F (36.6 C), temperature source Axillary, resp. rate 20, height 5' 7 (1.702 m), weight 132.4 kg, SpO2 98%.    Vent Mode: PRVC FiO2 (%):  [40 %] 40 % Set Rate:  [20 bmp] 20  bmp Vt Set:  [500 mL] 500 mL PEEP:  [5 cmH20] 5 cmH20 Plateau Pressure:  [18 cmH20-21 cmH20] 19 cmH20   Intake/Output Summary (Last 24 hours) at 02/04/2024 0921 Last data filed at 02/04/2024 0918 Gross per 24 hour  Intake 2745.07 ml  Output 1620 ml  Net 1125.07 ml   Filed Weights   02/02/24 2006 02/03/24 0205 02/04/24 0330  Weight: 136 kg 132.5 kg 132.4 kg    Physical Examination: Ill upward gaze Heavily sedated Ext warm Slight pallor NO response to pain RASS 0    Resolved problem List:   Assessment and Plan:   Acute on chronic hypoxic and hypercarbic respiratory failure mechanical  ventilation Decompensated OSA, ?OHS (not on CPAP) History of COPD on nocturnal O2 not in flare Right lower lobe pneumonia Acute encephalopathy History of TIA HTN HLD Hypothyroidism; h/o thyroid  cancer s/p resection DM- uncontrolled, A1c 11.5 NAFLD Chronic low back pain Chronic narcotic & benzo use Polymyalgia rheumatica Fibromyalgia Anxiety   MRI reassuring Overall most c/w polypharmacy and ?aspiration Wean sedation, continue reduced dose gabapentin  Use cleviprex for now for SBP < 160 If wakes up and does okay we can work to extubate L diaphragm is blunted, limited images due to body habitus Doxy fine for questionable CAP Basal bolus insulin  Albuterol  to PRN Needs careful reintroduction of anxiety/pain/psych meds to prevent recurrence  Best Practice (right click and Reselect all SmartList Selections daily)   Diet/type: NPO DVT prophylaxis SCDs, SQH GI prophylaxis: PPI Lines: N/A Foley:  Yes, and it is still needed Code Status:  full code Last date of multidisciplinary goals of care discussion [Pending]  31 min cc time Ardelle Kos MD PCCM

## 2024-02-04 NOTE — Progress Notes (Signed)
 eLink Physician-Brief Progress Note Patient Name: Mary Acosta DOB: 06-03-1956 MRN: [id number]   Date of Service  02/04/2024  HPI/Events of Note  Patient is maxed on peripheral Norepinephrine  gtt and needs a central line and an arterial line.  eICU Interventions  Arterial line ordered. PCCM ground crew requested to place a central line.        Lanier Felty U Bobbette Eakes 02/04/2024, 4:21 AM

## 2024-02-04 NOTE — Plan of Care (Signed)
  Problem: Education: Goal: Ability to describe self-care measures that may prevent or decrease complications (Diabetes Survival Skills Education) will improve Outcome: Progressing Goal: Individualized Educational Video(s) Outcome: Progressing   Problem: Coping: Goal: Ability to adjust to condition or change in health will improve Outcome: Progressing   Problem: Fluid Volume: Goal: Ability to maintain a balanced intake and output will improve Outcome: Not Progressing   Problem: Health Behavior/Discharge Planning: Goal: Ability to identify and utilize available resources and services will improve Outcome: Progressing Goal: Ability to manage health-related needs will improve Outcome: Progressing   Problem: Metabolic: Goal: Ability to maintain appropriate glucose levels will improve Outcome: Not Progressing   Problem: Nutritional: Goal: Maintenance of adequate nutrition will improve Outcome: Not Progressing Goal: Progress toward achieving an optimal weight will improve Outcome: Not Progressing   Problem: Skin Integrity: Goal: Risk for impaired skin integrity will decrease Outcome: Not Progressing   Problem: Tissue Perfusion: Goal: Adequacy of tissue perfusion will improve Outcome: Not Progressing   Problem: Education: Goal: Knowledge of General Education information will improve Description: Including pain rating scale, medication(s)/side effects and non-pharmacologic comfort measures Outcome: Progressing   Problem: Health Behavior/Discharge Planning: Goal: Ability to manage health-related needs will improve Outcome: Progressing   Problem: Clinical Measurements: Goal: Ability to maintain clinical measurements within normal limits will improve Outcome: Progressing Goal: Will remain free from infection Outcome: Progressing Goal: Diagnostic test results will improve Outcome: Progressing Goal: Respiratory complications will improve Outcome: Progressing Goal:  Cardiovascular complication will be avoided Outcome: Progressing   Problem: Activity: Goal: Risk for activity intolerance will decrease Outcome: Progressing   Problem: Nutrition: Goal: Adequate nutrition will be maintained Outcome: Not Progressing   Problem: Coping: Goal: Level of anxiety will decrease Outcome: Progressing   Problem: Elimination: Goal: Will not experience complications related to bowel motility Outcome: Progressing Goal: Will not experience complications related to urinary retention Outcome: Progressing   Problem: Pain Managment: Goal: General experience of comfort will improve and/or be controlled Outcome: Progressing   Problem: Safety: Goal: Ability to remain free from injury will improve Outcome: Not Progressing   Problem: Skin Integrity: Goal: Risk for impaired skin integrity will decrease Outcome: Not Progressing Patient total care extubated today to HF [place] failed swallow test at bedside

## 2024-02-04 NOTE — Inpatient Diabetes Management (Signed)
 Inpatient Diabetes Program Recommendations  AACE/ADA: New Consensus Statement on Inpatient Glycemic Control (2015)  Target Ranges:  Prepandial:   less than 140 mg/dL      Peak postprandial:   less than 180 mg/dL (1-2 hours)      Critically ill patients:  140 - 180 mg/dL   Lab Results  Component Value Date   GLUCAP 330 (H) 02/04/2024   HGBA1C 11.5 (A) 11/19/2023    Review of Glycemic Control  Latest Reference Range & Units 02/03/24 23:57 02/04/24 00:00 02/04/24 03:32 02/04/24 08:05 02/04/24 11:38  Glucose-Capillary 70 - 99 mg/dL 161 (H) 096 (H) 045 (H) 313 (H) 330 (H)   Diabetes history: DM 2 Outpatient Diabetes medications:  Dexcom G7 Toujeo  80 units daily Metformin  500 mg bid Ozempic  1 mg weekly  Current orders for Inpatient glycemic control:  Novolog  0-15 units tid with meals Semglee  30 units daily  Inpatient Diabetes Program Recommendations:    Consider increasing Semglee  to 25 units bid.   May need tube feed coverage as well?  Thanks,  Josefa Ni, RN, BC-ADM Inpatient Diabetes Coordinator Pager [phone]  (8a-5p)

## 2024-02-04 NOTE — Progress Notes (Signed)
 Pt was extubated to 7 L salter. Cuff leak prior to extubation. Pt was able to state name.

## 2024-02-04 NOTE — Procedures (Signed)
 Central Venous Catheter Insertion Procedure Note  Mary Acosta  [id number]  March 30, 1956  Date:02/04/24  Time:5:12 AM   Provider Performing:Saba Neuman Melven Stable Marene Shape   Procedure: Insertion of Non-tunneled Central Venous Catheter(36556) with US  guidance (40102)   Indication(s) Medication administration  Consent Unable to obtain consent due to emergent nature of procedure.  Anesthesia Topical only with 1% lidocaine    Timeout Verified patient identification, verified procedure, site/side was marked, verified correct patient position, special equipment/implants available, medications/allergies/relevant history reviewed, required imaging and test results available.  Sterile Technique Maximal sterile technique including full sterile barrier drape, hand hygiene, sterile gown, sterile gloves, mask, hair covering, sterile ultrasound probe cover (if used).  Procedure Description Area of catheter insertion was cleaned with chlorhexidine and draped in sterile fashion.  With real-time ultrasound guidance a central venous catheter was placed into the left internal jugular vein. Nonpulsatile blood flow and easy flushing noted in all ports.  The catheter was sutured in place and sterile dressing applied.  Complications/Tolerance None; patient tolerated the procedure well. Chest X-ray is ordered to verify placement for internal jugular or subclavian cannulation.   Chest x-ray is not ordered for femoral cannulation.  EBL Minimal  Specimen(s) None

## 2024-02-04 NOTE — Progress Notes (Addendum)
 0730 patient on vent with sedation and tube feeding running tube feeding increased to 30ml hour 0800 patient repositions and stimulated to follow commands and BP increased SBP over 215 PRN meds given for BP after BP did not decrease. Patient not following commands at this time 0845 BP remains elevated over 200SBP fentanyl  restarted an bolus of 50 mcg given BP responded and starting to decrease SBP in 170s 1030 started wean pt following commands, Husband called updated on plan of care for this morning 1037 HR 160-170 ST 1059 EKG 12 lead  1100 patient extubated placed On [place] HR 160 Meds given pt NSR additional 12 lead EKG completed NSR 1st AV block 1155 patient following commands lethargic will hold of on swallow exam until patient more alert 1330 patient failed bedside swallow speech consulted

## 2024-02-04 NOTE — Procedures (Signed)
 Arterial Line Insertion Start/End6/06/2024 6:03 AM, 02/04/2024 6:13 AM  Patient location: ICU. Preanesthetic checklist: patient identified, site marked, monitors and equipment checked and timeout performed Right, radial was placed Catheter size: 20 G Hand hygiene performed , maximum sterile barriers used  and Seldinger technique used  Attempts: 1 Procedure performed without using ultrasound guided technique. Following insertion, dressing applied and Biopatch. Post procedure assessment: normal  Patient tolerated the procedure well with no immediate complications.

## 2024-02-04 NOTE — Progress Notes (Signed)
 SLP Cancellation Note  Patient Details Name: Rosamary Boudreau MRN: [id number] DOB: August 19, 1956   Cancelled treatment:       Reason Eval/Treat Not Completed: Other (comment). Pt failed yale twice this afternoon. No oral meds needed at this time. RN suggested waiting until tomorrow for more recovery post extubation. Will complete assessment tomorrow.    Dameon Soltis, Hardin Leys 02/04/2024, 2:31 PM

## 2024-02-05 LAB — GLUCOSE, CAPILLARY
Glucose-Capillary: 219 mg/dL — ABNORMAL HIGH (ref 70–99)
Glucose-Capillary: 238 mg/dL — ABNORMAL HIGH (ref 70–99)
Glucose-Capillary: 245 mg/dL — ABNORMAL HIGH (ref 70–99)
Glucose-Capillary: 257 mg/dL — ABNORMAL HIGH (ref 70–99)
Glucose-Capillary: 258 mg/dL — ABNORMAL HIGH (ref 70–99)
Glucose-Capillary: 307 mg/dL — ABNORMAL HIGH (ref 70–99)

## 2024-02-05 LAB — BASIC METABOLIC PANEL WITH GFR
Anion gap: 11 (ref 5–15)
BUN: 27 mg/dL — ABNORMAL HIGH (ref 8–23)
CO2: 26 mmol/L (ref 22–32)
Calcium: 9.2 mg/dL (ref 8.9–10.3)
Chloride: 101 mmol/L (ref 98–111)
Creatinine, Ser: 1.04 mg/dL — ABNORMAL HIGH (ref 0.44–1.00)
GFR, Estimated: 59 mL/min — ABNORMAL LOW (ref >60)
Glucose, Bld: 275 mg/dL — ABNORMAL HIGH (ref 70–99)
Potassium: 3.9 mmol/L (ref 3.5–5.1)
Sodium: 138 mmol/L (ref 135–145)

## 2024-02-05 LAB — PHOSPHORUS: Phosphorus: 3.4 mg/dL (ref 2.5–4.6)

## 2024-02-05 LAB — MAGNESIUM: Magnesium: 1.7 mg/dL (ref 1.7–2.4)

## 2024-02-05 LAB — LEGIONELLA PNEUMOPHILA SEROGP 1 UR AG: L. pneumophila Serogp 1 Ur Ag: NEGATIVE

## 2024-02-05 MED ORDER — HYDRALAZINE HCL 20 MG/ML IJ SOLN: 10.0000 mg | INTRAMUSCULAR | Status: DC | PRN

## 2024-02-05 MED ORDER — METOPROLOL SUCCINATE ER 25 MG PO TB24
25.0000 mg | ORAL_TABLET | Freq: Every day | ORAL | Status: DC
  Administered 2024-02-05: 25 mg via ORAL
  Filled 2024-02-05: qty 1

## 2024-02-05 MED ORDER — POLYETHYLENE GLYCOL 3350 17 G PO PACK: 17.0000 g | PACK | Freq: Every day | ORAL | Status: DC

## 2024-02-05 MED ORDER — GABAPENTIN 300 MG PO CAPS
300.0000 mg | ORAL_CAPSULE | Freq: Two times a day (BID) | ORAL | Status: DC
  Administered 2024-02-05 – 2024-02-08 (×7): 300 mg via ORAL
  Filled 2024-02-05 (×7): qty 1

## 2024-02-05 MED ORDER — POTASSIUM CHLORIDE CRYS ER 20 MEQ PO TBCR: 20.0000 meq | EXTENDED_RELEASE_TABLET | Freq: Once | ORAL | Status: DC

## 2024-02-05 MED ORDER — MAGNESIUM SULFATE 2 GM/50ML IV SOLN
2.0000 g | Freq: Once | INTRAVENOUS | Status: AC
  Administered 2024-02-05: 2 g via INTRAVENOUS
  Filled 2024-02-05: qty 50

## 2024-02-05 MED ORDER — HYDRALAZINE HCL 25 MG PO TABS
25.0000 mg | ORAL_TABLET | Freq: Three times a day (TID) | ORAL | Status: DC
  Administered 2024-02-05: 25 mg via ORAL
  Filled 2024-02-05: qty 1

## 2024-02-05 MED ORDER — INSULIN GLARGINE-YFGN 100 UNIT/ML ~~LOC~~ SOLN
20.0000 [IU] | Freq: Two times a day (BID) | SUBCUTANEOUS | Status: DC
  Administered 2024-02-05 (×2): 20 [IU] via SUBCUTANEOUS
  Filled 2024-02-05 (×4): qty 0.2

## 2024-02-05 MED ORDER — HYDRALAZINE HCL 50 MG PO TABS
50.0000 mg | ORAL_TABLET | Freq: Three times a day (TID) | ORAL | Status: DC
  Administered 2024-02-05 – 2024-02-06 (×2): 50 mg via ORAL
  Filled 2024-02-05 (×2): qty 1

## 2024-02-05 MED ORDER — METOPROLOL SUCCINATE ER 25 MG PO TB24
25.0000 mg | ORAL_TABLET | Freq: Once | ORAL | Status: AC
  Administered 2024-02-05: 25 mg via ORAL
  Filled 2024-02-05: qty 1

## 2024-02-05 MED ORDER — METOPROLOL SUCCINATE ER 50 MG PO TB24: 50.0000 mg | ORAL_TABLET | Freq: Every day | ORAL | Status: DC

## 2024-02-05 MED ORDER — HYDRALAZINE HCL 25 MG PO TABS
25.0000 mg | ORAL_TABLET | Freq: Once | ORAL | Status: AC
  Administered 2024-02-05: 25 mg via ORAL
  Filled 2024-02-05: qty 1

## 2024-02-05 MED ORDER — PAROXETINE HCL 20 MG PO TABS
20.0000 mg | ORAL_TABLET | Freq: Every day | ORAL | Status: DC
  Administered 2024-02-05 – 2024-02-07 (×3): 20 mg via ORAL
  Filled 2024-02-05 (×4): qty 1

## 2024-02-05 MED ORDER — CLONIDINE HCL 0.1 MG PO TABS
0.1000 mg | ORAL_TABLET | Freq: Two times a day (BID) | ORAL | Status: DC
  Administered 2024-02-05 (×2): 0.1 mg via ORAL
  Filled 2024-02-05 (×2): qty 1

## 2024-02-05 MED ORDER — ENSURE PLUS HIGH PROTEIN PO LIQD
237.0000 mL | Freq: Two times a day (BID) | ORAL | Status: DC
  Administered 2024-02-07: 237 mL via ORAL

## 2024-02-05 MED ORDER — PAROXETINE HCL 20 MG PO TABS
40.0000 mg | ORAL_TABLET | Freq: Every day | ORAL | Status: DC
  Administered 2024-02-06 – 2024-02-08 (×3): 40 mg via ORAL
  Filled 2024-02-05 (×3): qty 2

## 2024-02-05 MED ORDER — DOXYCYCLINE HYCLATE 100 MG PO TABS
100.0000 mg | ORAL_TABLET | Freq: Two times a day (BID) | ORAL | Status: AC
  Administered 2024-02-05 – 2024-02-07 (×6): 100 mg via ORAL
  Filled 2024-02-05 (×6): qty 1

## 2024-02-05 MED ORDER — POTASSIUM CHLORIDE 10 MEQ/100ML IV SOLN: 10.0000 meq | INTRAVENOUS | Status: DC

## 2024-02-05 MED ORDER — ADULT MULTIVITAMIN W/MINERALS CH
1.0000 | ORAL_TABLET | Freq: Every day | ORAL | Status: DC
  Administered 2024-02-05 – 2024-02-08 (×4): 1 via ORAL
  Filled 2024-02-05 (×4): qty 1

## 2024-02-05 MED ORDER — AMLODIPINE BESYLATE 10 MG PO TABS
10.0000 mg | ORAL_TABLET | Freq: Every day | ORAL | Status: DC
  Administered 2024-02-06 – 2024-02-08 (×3): 10 mg via ORAL
  Filled 2024-02-05 (×3): qty 1

## 2024-02-05 MED ORDER — AMLODIPINE BESYLATE 5 MG PO TABS
5.0000 mg | ORAL_TABLET | Freq: Every day | ORAL | Status: DC
  Administered 2024-02-05: 5 mg via ORAL
  Filled 2024-02-05: qty 1

## 2024-02-05 MED ORDER — LACTULOSE 10 GM/15ML PO SOLN
20.0000 g | Freq: Two times a day (BID) | ORAL | Status: DC
  Administered 2024-02-05: 20 g via ORAL
  Filled 2024-02-05: qty 30

## 2024-02-05 MED ORDER — LEVOTHYROXINE SODIUM 75 MCG PO TABS
175.0000 ug | ORAL_TABLET | Freq: Every day | ORAL | Status: DC
  Administered 2024-02-06 – 2024-02-08 (×3): 175 ug via ORAL
  Filled 2024-02-05 (×3): qty 1

## 2024-02-05 MED ORDER — LABETALOL HCL 5 MG/ML IV SOLN: 10.0000 mg | INTRAVENOUS | Status: DC | PRN

## 2024-02-05 MED ORDER — POTASSIUM CHLORIDE CRYS ER 20 MEQ PO TBCR
20.0000 meq | EXTENDED_RELEASE_TABLET | Freq: Once | ORAL | Status: AC
  Administered 2024-02-05: 20 meq via ORAL
  Filled 2024-02-05: qty 1

## 2024-02-05 MED ORDER — SIMVASTATIN 20 MG PO TABS
20.0000 mg | ORAL_TABLET | Freq: Every day | ORAL | Status: DC
  Administered 2024-02-05 – 2024-02-07 (×3): 20 mg via ORAL
  Filled 2024-02-05 (×3): qty 1

## 2024-02-05 MED ORDER — AMLODIPINE BESYLATE 5 MG PO TABS
5.0000 mg | ORAL_TABLET | Freq: Once | ORAL | Status: AC
  Administered 2024-02-05: 5 mg via ORAL
  Filled 2024-02-05: qty 1

## 2024-02-05 NOTE — Evaluation (Addendum)
 Physical Therapy Evaluation Patient Details Name: Mary Acosta MRN: [id number] DOB: [birth year] Today's Date: 02/05/2024  History of Present Illness  68 y.o. female presents to [facility] 02/02/24 due to labored respirations and poor responsiveness. Admitted with acute on chronic hypoxic and hypercarbic respiratory failure, RLL PNA, acute encephalopathy. Intubated 6/9-6/11. PMHx: fibromyalgia, polymyalgia rheumatica, anxiety, chronic pain w/ narcotic use, COPD, cirrhosis, DM, HTN, TIA   Clinical Impression  Pt in bed upon arrival and agreeable to PT eval. PTA, pt was ModI with rollator. In today's session, pt was MinA for bed mobility and MinAx2 for safety with step-pivot and RW. Pt was then able to ambulate 6 ft with CGA and RW and close chair follow. Throughout the session, pt required increased time to process commands and appeared to have difficulty word-finding. Pt has 24/7 level of assist available from daughter upon return home. Recommending post-acute rehab >3hrs to work towards independence with mobility. Anticipating pt will progress well with strong family support, high levels of motivation, and active PLOF. Pt would benefit from acute skilled PT with current functional limitations listed below (see PT Problem List). Acute PT to follow.    BP 141/77 HR 103-120 BPM 100% SpO2 on 8L      If plan is discharge home, recommend the following: A lot of help with walking and/or transfers;A lot of help with bathing/dressing/bathroom;Assistance with cooking/housework;Direct supervision/assist for medications management;Direct supervision/assist for financial management;Assist for transportation;Help with stairs or ramp for entrance   Can travel by private vehicle        Equipment Recommendations BSC/3in1  Recommendations for Other Services  Rehab consult    Functional Status Assessment Patient has had a recent decline in their functional status and demonstrates the ability to make  significant improvements in function in a reasonable and predictable amount of time.     Precautions / Restrictions Precautions Precautions: Fall Precaution/Restrictions Comments: A-line, RIJ CVC Restrictions Weight Bearing Restrictions Per Provider Order: No      Mobility  Bed Mobility Overal bed mobility: Needs Assistance Bed Mobility: Supine to Sit    Supine to sit: Min assist, +2 for safety/equipment, HOB elevated    General bed mobility comments: pt initiating sitting on the EOB with MinA for slight steadying assist when raising trunk. Cues to shift hips forward to bring feet flat.    Transfers Overall transfer level: Needs assistance Equipment used: Rolling walker (2 wheels) Transfers: Sit to/from Stand, Bed to chair/wheelchair/BSC Sit to Stand: Min assist, +2 safety/equipment   Step pivot transfers: Min assist, +2 safety/equipment     General transfer comment: MinA to boost-up with +2 for safety with lines    Ambulation/Gait Ambulation/Gait assistance: Contact guard assist, +2 safety/equipment Gait Distance (Feet): 6 Feet Assistive device: Rolling walker (2 wheels) Gait Pattern/deviations: Step-through pattern, Decreased stride length, Trunk flexed Gait velocity: decr     General Gait Details: forward flexed trunk with short and cautious steps. Close chair follow with pt fatiguing and requesting to sit after 6 ft        Balance Overall balance assessment: Needs assistance, Mild deficits observed, not formally tested, History of Falls Sitting-balance support: No upper extremity supported, Feet supported Sitting balance-Leahy Scale: Good     Standing balance support: Bilateral upper extremity supported, During functional activity, Reliant on assistive device for balance Standing balance-Leahy Scale: Poor Standing balance comment: reliant on UE and external support       Pertinent Vitals/Pain Pain Assessment Pain Assessment: No/denies pain    Home  Living Family/patient expects to be discharged to:: Private residence Living Arrangements: Spouse/significant other Available Help at Discharge: Family;Available 24 hours/day (daughter can assist physically) Type of Home: House Home Access: Ramped entrance       Home Layout: One level Home Equipment: Rollator (4 wheels);Cane - single Librarian, academic (2 wheels)      Prior Function Prior Level of Function : Needs assist      Mobility Comments: ModI with rollator ADLs Comments: Difficulty with buttons, sits in the tub to bathe     Extremity/Trunk Assessment   Upper Extremity Assessment Upper Extremity Assessment: Defer to OT evaluation    Lower Extremity Assessment Lower Extremity Assessment: Generalized weakness (L>R chronic knee pain, receives injections)    Cervical / Trunk Assessment Cervical / Trunk Assessment: Other exceptions Cervical / Trunk Exceptions: increased body habitus  Communication   Communication Communication: Impaired Factors Affecting Communication: Difficulty expressing self;Reduced clarity of speech (slow speech with trouble word finding)    Cognition Arousal: Alert Behavior During Therapy: Flat affect   PT - Cognitive impairments: No family/caregiver present to determine baseline, Orientation, Memory, Safety/Judgement   Orientation impairments: Situation    PT - Cognition Comments: Moved quickly with cues to slow down for line management. Increased time to process commands and respond to questions Following commands: Impaired Following commands impaired: Follows one step commands with increased time     Cueing Cueing Techniques: Verbal cues, Tactile cues     General Comments General comments (skin integrity, edema, etc.): Discussed performing ankle pumps and SLR with pt demonstrating a few reps     PT Assessment Patient needs continued PT services  PT Problem List Decreased strength;Decreased activity tolerance;Decreased  balance;Decreased mobility;Decreased cognition;Decreased knowledge of use of DME;Decreased safety awareness;Cardiopulmonary status limiting activity       PT Treatment Interventions DME instruction;Gait training;Functional mobility training;Therapeutic activities;Therapeutic exercise;Balance training;Neuromuscular re-education;Patient/family education    PT Goals (Current goals can be found in the Care Plan section)  Acute Rehab PT Goals Patient Stated Goal: to get stronger PT Goal Formulation: With patient Time For Goal Achievement: 02/19/24 Potential to Achieve Goals: Good    Frequency Min 3X/week     Co-evaluation   Reason for Co-Treatment: For patient/therapist safety;To address functional/ADL transfers PT goals addressed during session: Mobility/safety with mobility;Balance;Proper use of DME         AM-PAC PT 6 Clicks Mobility  Outcome Measure Help needed turning from your back to your side while in a flat bed without using bedrails?: A Little Help needed moving from lying on your back to sitting on the side of a flat bed without using bedrails?: A Little Help needed moving to and from a bed to a chair (including a wheelchair)?: A Little Help needed standing up from a chair using your arms (e.g., wheelchair or bedside chair)?: A Little Help needed to walk in hospital room?: A Lot Help needed climbing 3-5 steps with a railing? : Total 6 Click Score: 15    End of Session Equipment Utilized During Treatment: Oxygen  Activity Tolerance: Patient tolerated treatment well Patient left: in chair;with call bell/phone within reach;with chair alarm set Nurse Communication: Mobility status PT Visit Diagnosis: Unsteadiness on feet (R26.81);Other abnormalities of gait and mobility (R26.89);Muscle weakness (generalized) (M62.81)    Time: 4034-7425 PT Time Calculation (min) (ACUTE ONLY): 36 min   Charges:   PT Evaluation $PT Eval Moderate Complexity: 1 Mod   PT General  Charges $$ ACUTE PT VISIT: 1 Visit  Orysia Blas, PT, DPT Secure Chat Preferred  Rehab Office [phone]   Alissa April Adela Ades 02/05/2024, 12:03 PM

## 2024-02-05 NOTE — Progress Notes (Addendum)
 NAME:  Mary Acosta, MRN:  [id number], DOB:  [birth year], LOS: 3 ADMISSION DATE:  02/02/2024, CONSULTATION DATE: 6/9 REFERRING MD: Dr. Daivd Dub EDP, CHIEF COMPLAINT: Dyspnea  History of Present Illness:  68 year old woman with past medical history as below, which is significant for chronic pain with narcotic use, COPD, diabetes complicated by diabetic neuropathy, cirrhosis, obstructive sleep apnea (unable to tolerate CPAP), and polymyalgia rheumatica.    Patient is intubated; therefore, history has been obtained from chart review. EMS was called to the house 6/9 for patient having labored respirations and poor responsiveness.  Upon EMS arrival the patient immediately required bag-valve-mask assist ventilations.  EMS administered Narcan  without effect.  With assisted ventilations the patient did gain some level of responsiveness although remained quite poor.  The patient remained dyspneic and poorly responsive upon arrival to the emergency department she was intubated.  Chest x-ray concerning for vascular congestion.  Laboratory evaluation demonstrated hypercapnia.   PCCM asked to admit for respiratory failure.  Pertinent Medical History:   Past Medical History:  Diagnosis Date   AKI (acute kidney injury) (HCC) 01/2017   Allergy    seasonal   Anxiety    with panic attacks   Arthritis    back; feet; hands; shoulders (08/26/2014)   Asthma    Cataract    forming   Cervical cancer (HCC)    Chronic lower back pain    Chronic narcotic use    Chronic pain syndrome    [facility]   Cirrhosis (HCC)    Clostridium difficile infection 2017   COPD (chronic obstructive pulmonary disease) (HCC)    Daily headache    past hx   Depression    Diabetic neuropathy (HCC) 06/04/2017   feet  and legs , some in hands   DJD (degenerative joint disease)    Fatty liver disease, nonalcoholic    Fibromyalgia    Frequency of urination    HCAP (healthcare-associated pneumonia)  08/26/2014   History of TIA (transient ischemic attack) 11/01/2010   NO RESIDUAL   Hyperlipidemia    Hypertension    Hypothyroidism    IDDM (insulin  dependent diabetes mellitus)    Insomnia    Lumbar stenosis    Macular degeneration    Memory difficulty 07/25/2016   Nocturia    OSA (obstructive sleep apnea)    NO CPAP SINCE WT LOSS   Osteoarthritis    with severe disease in knee   Oxygen  deficiency    4  liters at bedtime only   Pneumonia several times   Polymyalgia rheumatica (HCC)    Scoliosis    Seasonal allergies    Stroke (HCC)    TIA   Thyroid  cancer (HCC)    Urgency of urination    Vaginal pain S/P SLING  FEB 2012   Significant Hospital Events: Including procedures, antibiotic start and stop dates in addition to other pertinent events   6/9 - Presented to [facility] ED via EMS for dyspnea, respiratory failure. Intubated in ED. Hypercapnic and hypoxic on gas. Received Narcan  to ensure no component of narcosis. PCCM consulted for ICU admission. 6/11 High BP this am with any sedation wean, tolerated extubation mid morning 6/12 remains on Cleviprex drip but alert and interactive this a.m.  Interim History / Subjective:  States she feels well and is requesting breakfast  Objective:   Blood pressure (!) 147/79, pulse (!) 106, temperature 98.7 F (37.1 C), temperature source Oral, resp. rate 15, height 5' 7 (1.702  m), weight 129.2 kg, SpO2 98%. CVP:  [4 mmHg-6 mmHg] 6 mmHg  Vent Mode: CPAP;PSV FiO2 (%):  [40 %] 40 % Set Rate:  [18 bmp-20 bmp] 18 bmp Vt Set:  [500 mL] 500 mL PEEP:  [5 cmH20] 5 cmH20 Pressure Support:  [8 cmH20] 8 cmH20 Plateau Pressure:  [19 cmH20] 19 cmH20   Intake/Output Summary (Last 24 hours) at 02/05/2024 0740 Last data filed at 02/04/2024 2300 Gross per 24 hour  Intake 828.77 ml  Output 385 ml  Net 443.77 ml   Filed Weights   02/03/24 0205 02/04/24 0330 02/05/24 0500  Weight: 132.5 kg 132.4 kg 129.2 kg    Physical Examination: General:  Acute on chronic ill-appearing obese middle-aged female sitting up in bed in no acute distress HEENT: ETT, MM pink/moist, PERRL,  Neuro: Alert and oriented x 3, slightly confused to situation CV: s1s2 regular rate and rhythm, no murmur, rubs, or gallops,  PULM: Slightly diminished bilaterally, no increased work of breathing, no added breath sounds satting 98% on 7 L GI: soft, bowel sounds active in all 4 quadrants, non-tender, non-distended, Extremities: warm/dry, nonpitting generalized edema  Skin: no rashes or lesions  Resolved problem List:  Mechanical ventilation Acute encephalopathy  Assessment and Plan:   Acute on chronic hypoxic and hypercarbic respiratory failure  -Tolerated extubation 6/11 Decompensated OSA, ?OHS (not on CPAP) History of COPD on nocturnal O2 not in acute exacerbation on admission Right lower lobe pneumonia P: Wean supplemental oxygen  as able Mobilize Aspiration precautions Encourage pulmonary hygiene Continue empiric doxycycline  Slowly resume home narcotics/antianxiety medications to decrease potential oversedation/polypharmacy  History of TIA P: Supportive care  HTN HLD -Home medications appear to include Norvasc , clonidine, Lasix , hydralazine , Toprol  XL, simvastatin  P: Continue Cleviprex currently Complete medication reconciliation once done resume home antihypertensives Continuous telemetry Continue statin Optimize electrolytes  Hypothyroidism; h/o thyroid  cancer s/p resection\ P: Resume home Synthroid  once medication reconciliation completed  Type 2 diabetes -Uncontrolled, A1c 11.5, Home medications include semaglutide , Tresiba , and metformin  P: SSI CBG checks every 4 CBG goal 140-180 Increase long-acting insulin  to 20 units twice daily  Nonalcoholic fatty liver disease P: Avoid hepatotoxins Outpatient follow-up  Chronic low back pain Chronic narcotic & benzo use Polymyalgia rheumatica Fibromyalgia Anxiety -Home medications  include buprenorphine, BuSpar , Celexa, Klonopin , Paxil , trazodone P: Once cleared for oral diet resume home medications at reduced dose   Best Practice (right click and Reselect all SmartList Selections daily)   Diet/type: NPO DVT prophylaxis SCDs, SQH GI prophylaxis: PPI Lines: N/A Foley:  Yes, and it is still needed Code Status:  full code Last date of multidisciplinary goals of care discussion [Pending]  CRITICAL CARE Performed by: Deval Mroczka D. Harris   Total critical care time: 38 minutes  Critical care time was exclusive of separately billable procedures and treating other patients.  Critical care was necessary to treat or prevent imminent or life-threatening deterioration.  Critical care was time spent personally by me on the following activities: development of treatment plan with patient and/or surrogate as well as nursing, discussions with consultants, evaluation of patient's response to treatment, examination of patient, obtaining history from patient or surrogate, ordering and performing treatments and interventions, ordering and review of laboratory studies, ordering and review of radiographic studies, pulse oximetry and re-evaluation of patient's condition. Lennyx Verdell D. Harris, NP-C [facility] Personal contact information can be found on Amion  If no contact or response made please call [phone] 02/05/2024, 8:20 AM

## 2024-02-05 NOTE — Progress Notes (Signed)
 Inpatient Rehab Admissions Coordinator:   Per therapy recommendations pt was screened for CIR by Loye Rumble, PT, DPT.  Note pt admitted for respiratory failure, requiring brief intubation.  Currently requiring cleviprex for BP control, low tacchy today.  Mobilizing with guarding assist for short distance mobility.  SLP signed off for no needs.  In order to qualify for CIR pt must have needs for intensive therapy in at least 2 disciplines.  Will follow for OT eval, but also note may be difficult to get insurance approval with diagnosis.  Would recommend TOC initiate conversations regarding other rehab venues in case pt doesn't qualify for CIR>   Loye Rumble, PT, DPT Admissions Coordinator [phone] 02/05/24  12:23 PM

## 2024-02-05 NOTE — Inpatient Diabetes Management (Signed)
 Inpatient Diabetes Program Recommendations  AACE/ADA: New Consensus Statement on Inpatient Glycemic Control (2015)  Target Ranges:  Prepandial:   less than 140 mg/dL      Peak postprandial:   less than 180 mg/dL (1-2 hours)      Critically ill patients:  140 - 180 mg/dL   Lab Results  Component Value Date   GLUCAP 257 (H) 02/05/2024   HGBA1C 11.5 (A) 11/19/2023    Review of Glycemic Control  Latest Reference Range & Units 02/04/24 11:38 02/04/24 16:08 02/04/24 20:20 02/04/24 23:33 02/05/24 03:53 02/05/24 08:41  Glucose-Capillary 70 - 99 mg/dL 657 (H) 846 (H) 962 (H) 248 (H) 245 (H) 257 (H)  Diabetes history: DM 2 Outpatient Diabetes medications:  Dexcom G7 Toujeo  80 units daily Metformin  500 mg bid Ozempic  1 mg weekly Current orders for Inpatient glycemic control:  Novolog  0-15 units tid with meals Semglee  20 units bid Inpatient Diabetes Program Recommendations:   Consider further increase of Semglee  to 25 units bid and add Novolog  meal coverage 6 units tid with meals.   Thanks,  Josefa Ni, RN, BC-ADM Inpatient Diabetes Coordinator Pager [phone]  (8a-5p)

## 2024-02-05 NOTE — Plan of Care (Signed)
  Problem: Education: Goal: Ability to describe self-care measures that may prevent or decrease complications (Diabetes Survival Skills Education) will improve Outcome: Progressing   Problem: Coping: Goal: Ability to adjust to condition or change in health will improve Outcome: Progressing   Problem: Fluid Volume: Goal: Ability to maintain a balanced intake and output will improve Outcome: Progressing   Problem: Metabolic: Goal: Ability to maintain appropriate glucose levels will improve Outcome: Progressing   Problem: Skin Integrity: Goal: Risk for impaired skin integrity will decrease Outcome: Progressing   

## 2024-02-05 NOTE — Progress Notes (Signed)
 Nutrition Follow-up  DOCUMENTATION CODES: Obesity unspecified  INTERVENTION:  Multivitamin w/ minerals daily Encourage good PO intake Monitor PO intakes for need for additional supplementation  NUTRITION DIAGNOSIS:  Inadequate oral intake related to inability to eat as evidenced by NPO status. - Progressing, diet advanced  GOAL:  Patient will meet greater than or equal to 90% of their needs - Not met  MONITOR:  PO intake, I & O's, Labs, Skin  REASON FOR ASSESSMENT:  Consult Enteral/tube feeding initiation and management  ASSESSMENT:  68 y.o. female presented to the ED with dyspnea. PMH includes COPD, CHF, gastroparesis, HLD, gout, T2DM, cirrhosis, HTN, and lap band/vagotomy surgery in 2008. Pt admitted with acute respiratory failure and acute encephalopathy.   6/09 - Admitted; Intubated 6/10 - TF initiated  6/11 - Extubated 6/12 - diet advanced to Heart Health/Carb Modified  Pt working with therapy at time of RD visit. Spoke with RN outside of room. Reports that she was able to advance diet and has been asking to eat, just waiting on a tray to arrive.  RD team will continue to follow for needs for additional supplementation.   SLP team signed off after after pt was able to pass swallow screen.   Nutrition Related Medications: Colace, NovoLog  0-15 units TID, Semglee  20 units BID, Lactulose, Protonix, Miralax , IV antibiotics  Drips Cleviprex 42 mL/hr (2016 kcal/hr)  Labs: Sodium 140, Potassium 4.0, BUN 17, Creatinine 1.32, Phosphorus 3.5, Magnesium  1.7, Triglycerides 178, Hgb A1c 11.5 (11/19/23)  CBG: 212-274 mg/dL x 24 hrs    Diet Order:   Diet Order             Diet heart healthy/carb modified Room service appropriate? Yes; Fluid consistency: Thin  Diet effective now                  EDUCATION NEEDS: No education needs have been identified at this time  Skin:  Skin Assessment: Reviewed RN Assessment  Last BM:  6/10  Height:  Ht Readings from Last 1  Encounters:  02/02/24 5' 7 (1.702 m)   Weight:  Wt Readings from Last 1 Encounters:  02/05/24 129.2 kg   Ideal Body Weight:  61.4 kg  BMI:  Body mass index is 44.61 kg/m.  Estimated Nutritional Needs:  Kcal:  1900-2100 Protein:  110-130 grams Fluid:  >/= 1.9 L   Doneta Furbish RD, LDN Clinical Dietitian

## 2024-02-05 NOTE — Evaluation (Signed)
 Occupational Therapy Evaluation Patient Details Name: Mary Acosta MRN: [id number] DOB: [birth year] Today's Date: 02/05/2024   History of Present Illness   68 y.o. female [facility]/02/24 due to labored respirations and poor responsiveness. Admitted with acute on chronic hypoxic and hypercarbic respiratory failure, RLL PNA, acute encephalopathy. Intubated 6/9-6/11. PMHx: fibromyalgia, polymyalgia rheumatica, anxiety, chronic pain w/ narcotic use, COPD, cirrhosis, DM, HTN, TIA     Clinical Impressions Pt walks with a rollator and report being independent in self care, getting down in tub to bathe. She does not drive and orders groceries for delivery. Pt lives with her husband, who cannot provide physical assist, but  her daughter can stay with her when she returns home. Pt presents with generalized weakness, impaired cognition, decreased activity tolerance and imbalance in standing. She needs min +2 assist for OOB mobility and min to total assist for ADLs. Pt with stable O2 on 7L, HR to 120 with exertion. Patient will benefit from intensive inpatient follow-up therapy, >3 hours/day.      If plan is discharge home, recommend the following:   Two people to help with bathing/dressing/bathroom;Direct supervision/assist for medications management;Direct supervision/assist for financial management;Assist for transportation;Help with stairs or ramp for entrance;Assistance with feeding;Assistance with cooking/housework;A little help with walking and/or transfers;A lot of help with bathing/dressing/bathroom     Functional Status Assessment   Patient has had a recent decline in their functional status and demonstrates the ability to make significant improvements in function in a reasonable and predictable amount of time.     Equipment Recommendations   Other (comment) (TBD)     Recommendations for Other Services         Precautions/Restrictions    Precautions Precautions: Fall Precaution/Restrictions Comments: A-line, RIJ CVC Restrictions Weight Bearing Restrictions Per Provider Order: No     Mobility Bed Mobility Overal bed mobility: Needs Assistance Bed Mobility: Supine to Sit     Supine to sit: Min assist, +2 for safety/equipment, HOB elevated     General bed mobility comments: pt initiating sitting on the EOB with MinA for slight steadying assist when raising trunk. Cues to shift hips forward to bring feet flat.    Transfers Overall transfer level: Needs assistance Equipment used: Rolling walker (2 wheels) Transfers: Sit to/from Stand, Bed to chair/wheelchair/BSC Sit to Stand: Min assist, +2 safety/equipment     Step pivot transfers: Min assist, +2 safety/equipment     General transfer comment: MinA to boost-up with +2 for safety with lines      Balance Overall balance assessment: Needs assistance, Mild deficits observed, not formally tested, History of Falls Sitting-balance support: No upper extremity supported, Feet supported Sitting balance-Leahy Scale: Good     Standing balance support: Bilateral upper extremity supported, During functional activity, Reliant on assistive device for balance Standing balance-Leahy Scale: Poor Standing balance comment: reliant on UE and external support                           ADL either performed or assessed with clinical judgement   ADL Overall ADL's : Needs assistance/impaired Eating/Feeding: Minimal assistance;Sitting Eating/Feeding Details (indicate cue type and reason): issued red foam build up Grooming: Brushing hair;Total assistance;Sitting   Upper Body Bathing: Sitting;Maximal assistance   Lower Body Bathing: Total assistance;Sit to/from stand;+2 for safety/equipment   Upper Body Dressing : Moderate assistance;Sitting   Lower Body Dressing: Total assistance;Sitting/lateral leans   Toilet Transfer: +2 for physical assistance;Minimal  assistance;Stand-pivot;BSC/3in1;Rolling  walker (2 wheels)   Toileting- Clothing Manipulation and Hygiene: Total assistance;+2 for safety/equipment;Sit to/from stand               Vision Baseline Vision/History: 1 Wears glasses Ability to See in Adequate Light: 0 Adequate Patient Visual Report: No change from baseline       Perception         Praxis         Pertinent Vitals/Pain Pain Assessment Pain Assessment: No/denies pain     Extremity/Trunk Assessment Upper Extremity Assessment Upper Extremity Assessment: Generalized weakness;Right hand dominant (edematous, hx of peripheral neuropathy)   Lower Extremity Assessment Lower Extremity Assessment: Defer to PT evaluation   Cervical / Trunk Assessment Cervical / Trunk Assessment: Other exceptions Cervical / Trunk Exceptions: increased body habitus   Communication Communication Communication: Impaired Factors Affecting Communication: Difficulty expressing self;Reduced clarity of speech (word finding, slow response speed)   Cognition Arousal: Alert Behavior During Therapy: Flat affect Cognition: Cognition impaired   Orientation impairments: Situation, Time Awareness: Intellectual awareness impaired, Online awareness impaired Memory impairment (select all impairments): Short-term memory Attention impairment (select first level of impairment): Sustained attention Executive functioning impairment (select all impairments): Initiation, Sequencing                   Following commands: Impaired Following commands impaired: Follows one step commands with increased time     Cueing  General Comments   Cueing Techniques: Verbal cues;Tactile cues  Discussed performing ankle pumps and SLR with pt demonstrating a few reps   Exercises     Shoulder Instructions      Home Living Family/patient expects to be discharged to:: Private residence Living Arrangements: Spouse/significant other Available Help at  Discharge: Family;Available 24 hours/day (daughter can stay with pt) Type of Home: House Home Access: Ramped entrance     Home Layout: One level     Bathroom Shower/Tub: Chief Strategy Officer: Handicapped height     Home Equipment: Rollator (4 wheels);Cane - single Librarian, academic (2 wheels)          Prior Functioning/Environment Prior Level of Function : Needs assist             Mobility Comments: ModI with rollator ADLs Comments: Difficulty with buttons, sits in the tub to bathe    OT Problem List: Decreased strength;Decreased activity tolerance;Impaired balance (sitting and/or standing);Decreased coordination;Increased edema;Impaired UE functional use;Cardiopulmonary status limiting activity;Decreased safety awareness;Decreased cognition;Decreased knowledge of use of DME or AE;Obesity   OT Treatment/Interventions: Self-care/ADL training;DME and/or AE instruction;Therapeutic activities;Patient/family education;Balance training;Cognitive remediation/compensation;Energy conservation      OT Goals(Current goals can be found in the care plan section)   Acute Rehab OT Goals OT Goal Formulation: With patient Time For Goal Achievement: 02/19/24 Potential to Achieve Goals: Good ADL Goals Pt Will Perform Eating: Independently;sitting Pt Will Perform Grooming: with supervision;standing Pt Will Perform Upper Body Bathing: with min assist;sitting Pt Will Perform Upper Body Dressing: with set-up;sitting Pt Will Transfer to Toilet: with supervision;ambulating;bedside commode Additional ADL Goal #1: Pt follow two step commands with 75% accuracy.   OT Frequency:  Min 2X/week    Co-evaluation PT/OT/SLP Co-Evaluation/Treatment: Yes Reason for Co-Treatment: For patient/therapist safety;To address functional/ADL transfers PT goals addressed during session: Mobility/safety with mobility;Balance;Proper use of DME OT goals addressed during session: ADL's and  self-care      AM-PAC OT 6 Clicks Daily Activity     Outcome Measure Help from another person eating meals?: A Little Help from another  person taking care of personal grooming?: A Lot Help from another person toileting, which includes using toliet, bedpan, or urinal?: Total Help from another person bathing (including washing, rinsing, drying)?: A Lot Help from another person to put on and taking off regular upper body clothing?: A Lot Help from another person to put on and taking off regular lower body clothing?: Total 6 Click Score: 11   End of Session Equipment Utilized During Treatment: Rolling walker (2 wheels);Gait belt;Oxygen  (7L) Nurse Communication: Mobility status  Activity Tolerance: Patient tolerated treatment well Patient left: in chair;with call bell/phone within reach;with chair alarm set;with family/visitor present  OT Visit Diagnosis: Unsteadiness on feet (R26.81);Other abnormalities of gait and mobility (R26.89);Muscle weakness (generalized) (M62.81);Other symptoms and signs involving cognitive function;Other (comment) (decreased activity tolerance)                Time: 1005-1049 OT Time Calculation (min): 44 min Charges:  OT General Charges $OT Visit: 1 Visit OT Evaluation $OT Eval Moderate Complexity: 1 Mod OT Treatments $Self Care/Home Management : 8-22 mins  Avanell Leigh, OTR/L Acute Rehabilitation Services Office: [phone]  Jonette Nestle 02/05/2024, 12:54 PM

## 2024-02-05 NOTE — Progress Notes (Signed)
 SLP Cancellation Note  Patient Details Name: Mary Acosta MRN: [id number] DOB: [birth year]   Cancelled treatment:       Reason Eval/Treat Not Completed: Other (comment). RN reports pt passed screen will sign off   Skyelar Swigart, Hardin Leys 02/05/2024, 9:04 AM

## 2024-02-06 DIAGNOSIS — E1165 Type 2 diabetes mellitus with hyperglycemia: Secondary | ICD-10-CM

## 2024-02-06 DIAGNOSIS — K7581 Nonalcoholic steatohepatitis (NASH): Secondary | ICD-10-CM

## 2024-02-06 DIAGNOSIS — N1831 Chronic kidney disease, stage 3a: Secondary | ICD-10-CM

## 2024-02-06 LAB — GLUCOSE, CAPILLARY
Glucose-Capillary: 203 mg/dL — ABNORMAL HIGH (ref 70–99)
Glucose-Capillary: 219 mg/dL — ABNORMAL HIGH (ref 70–99)
Glucose-Capillary: 307 mg/dL — ABNORMAL HIGH (ref 70–99)
Glucose-Capillary: 150 mg/dL — ABNORMAL HIGH (ref 70–99)
Glucose-Capillary: 139 mg/dL — ABNORMAL HIGH (ref 70–99)

## 2024-02-06 MED ORDER — MAGNESIUM SULFATE 4 GM/100ML IV SOLN
4.0000 g | Freq: Once | INTRAVENOUS | Status: AC
  Administered 2024-02-06: 4 g via INTRAVENOUS
  Filled 2024-02-06: qty 100

## 2024-02-06 MED ORDER — CARVEDILOL 25 MG PO TABS
25.0000 mg | ORAL_TABLET | Freq: Two times a day (BID) | ORAL | Status: DC
  Administered 2024-02-06 – 2024-02-08 (×5): 25 mg via ORAL
  Filled 2024-02-06 (×5): qty 1

## 2024-02-06 MED ORDER — INSULIN GLARGINE-YFGN 100 UNIT/ML ~~LOC~~ SOLN
25.0000 [IU] | Freq: Two times a day (BID) | SUBCUTANEOUS | Status: DC
  Administered 2024-02-06 – 2024-02-08 (×5): 25 [IU] via SUBCUTANEOUS
  Filled 2024-02-06 (×7): qty 0.25

## 2024-02-06 MED ORDER — POLYETHYLENE GLYCOL 3350 17 G PO PACK: 17.0000 g | PACK | Freq: Every day | ORAL | Status: DC | PRN

## 2024-02-06 MED ORDER — ORAL CARE MOUTH RINSE: 15.0000 mL | OROMUCOSAL | Status: DC | PRN

## 2024-02-06 MED ORDER — INSULIN ASPART 100 UNIT/ML IJ SOLN
6.0000 [IU] | Freq: Three times a day (TID) | INTRAMUSCULAR | Status: DC
  Administered 2024-02-06 – 2024-02-08 (×4): 6 [IU] via SUBCUTANEOUS

## 2024-02-06 MED ORDER — LIDOCAINE 5 % EX PTCH
1.0000 | MEDICATED_PATCH | CUTANEOUS | Status: DC
  Administered 2024-02-06 – 2024-02-08 (×4): 1 via TRANSDERMAL
  Filled 2024-02-06 (×2): qty 1

## 2024-02-06 MED ORDER — ENOXAPARIN SODIUM 40 MG/0.4ML IJ SOSY
40.0000 mg | PREFILLED_SYRINGE | Freq: Every day | INTRAMUSCULAR | Status: DC
  Administered 2024-02-06 – 2024-02-07 (×2): 40 mg via SUBCUTANEOUS
  Filled 2024-02-06 (×2): qty 0.4

## 2024-02-06 MED ORDER — LIDOCAINE 5 % EX PTCH
1.0000 | MEDICATED_PATCH | CUTANEOUS | Status: DC
  Administered 2024-02-06 – 2024-02-08 (×3): 1 via TRANSDERMAL
  Filled 2024-02-06 (×3): qty 1

## 2024-02-06 MED ORDER — HYDROCHLOROTHIAZIDE 25 MG PO TABS
25.0000 mg | ORAL_TABLET | Freq: Every day | ORAL | Status: DC
  Administered 2024-02-06 – 2024-02-08 (×3): 25 mg via ORAL
  Filled 2024-02-06 (×3): qty 1

## 2024-02-06 NOTE — Progress Notes (Signed)
 Physical Therapy Treatment Patient Details Name: Mary Acosta MRN: [id number] DOB: 03-27-1956 Today's Date: 02/06/2024   History of Present Illness 68 y.o. female presents to [facility] 02/02/24 due to labored respirations and poor responsiveness. Admitted with acute on chronic hypoxic and hypercarbic respiratory failure, RLL PNA, acute encephalopathy. Intubated 6/9-6/11. PMHx: fibromyalgia, polymyalgia rheumatica, anxiety, chronic pain w/ narcotic use, COPD, cirrhosis, DM, HTN, TIA   PT Comments  Pt making steady progress towards acute PT goals. Pt continues to need MinA for boost-up when standing with RW. Able to increase gait distance with two sets of x15 ft and x50 ft with close chair follow. Pt required MinA for RW management and cues for safety due to pt veering right/left and tendency to run into obstacles. Continue to recommend >3hrs post acute rehab to maximize rehab potential. Acute PT to follow.   >94% SpO2 on RA at rest 86% SpO2 on RA during ambulation 92% SpO2 on 1L during ambulation  >94% Spo2 on RA after ambulation   If plan is discharge home, recommend the following: A little help with walking and/or transfers;A little help with bathing/dressing/bathroom;Assistance with cooking/housework;Assist for transportation;Help with stairs or ramp for entrance   Can travel by private vehicle      Yes  Equipment Recommendations  None recommended by PT       Precautions / Restrictions Precautions Precautions: Fall Precaution/Restrictions Comments: watch O2 Restrictions Weight Bearing Restrictions Per Provider Order: No     Mobility  Bed Mobility Overal bed mobility: Needs Assistance Bed Mobility: Supine to Sit    Supine to sit: Min assist, Used rails, HOB elevated    General bed mobility comments: MinA for slight trunk raise    Transfers Overall transfer level: Needs assistance Equipment used: Rolling walker (2 wheels) Transfers: Sit to/from Stand Sit to Stand: Min  assist    General transfer comment: MinA for boost-up, cues for hand placement with RW    Ambulation/Gait Ambulation/Gait assistance: Min assist, +2 safety/equipment Gait Distance (Feet): 15 Feet (x15, x50) Assistive device: Rolling walker (2 wheels) Gait Pattern/deviations: Step-through pattern, Decreased stride length, Trunk flexed Gait velocity: decr     General Gait Details: slow and steady gait with pt veering right/left. Increased difficulty navigating around obstacles with MinA for RW management and cues. With no cues, pt would run into obstacles. Cues for upright posture. Close chair follow      Balance Overall balance assessment: Needs assistance, Mild deficits observed, not formally tested, History of Falls Sitting-balance support: No upper extremity supported, Feet supported Sitting balance-Leahy Scale: Good     Standing balance support: Bilateral upper extremity supported, During functional activity, Reliant on assistive device for balance Standing balance-Leahy Scale: Poor Standing balance comment: reliant on UE and external support     Communication Communication Communication: Impaired Factors Affecting Communication: Difficulty expressing self (slowed speech)  Cognition Arousal: Alert Behavior During Therapy: Flat affect   PT - Cognitive impairments: No family/caregiver present to determine baseline, Safety/Judgement  PT - Cognition Comments: Moves impulsively before having lines managed. Cues for safety when navigating around obstacles Following commands: Impaired Following commands impaired: Follows one step commands with increased time    Cueing Cueing Techniques: Verbal cues, Tactile cues         Pertinent Vitals/Pain Pain Assessment Pain Assessment: No/denies pain     PT Goals (current goals can now be found in the care plan section) Acute Rehab PT Goals Patient Stated Goal: to go home PT Goal Formulation: With patient  Time For Goal  Achievement: 02/19/24 Potential to Achieve Goals: Good Progress towards PT goals: Progressing toward goals    Frequency    Min 3X/week       AM-PAC PT 6 Clicks Mobility   Outcome Measure  Help needed turning from your back to your side while in a flat bed without using bedrails?: A Little Help needed moving from lying on your back to sitting on the side of a flat bed without using bedrails?: A Little Help needed moving to and from a bed to a chair (including a wheelchair)?: A Little Help needed standing up from a chair using your arms (e.g., wheelchair or bedside chair)?: A Little Help needed to walk in hospital room?: A Little Help needed climbing 3-5 steps with a railing? : A Lot 6 Click Score: 17    End of Session Equipment Utilized During Treatment: Oxygen  Activity Tolerance: Patient tolerated treatment well Patient left: in chair;with call bell/phone within reach;with chair alarm set;with nursing/sitter in room Nurse Communication: Mobility status PT Visit Diagnosis: Unsteadiness on feet (R26.81);Other abnormalities of gait and mobility (R26.89);Muscle weakness (generalized) (M62.81)     Time: 1610-9604 PT Time Calculation (min) (ACUTE ONLY): 27 min  Charges:    $Gait Training: 8-22 mins $Therapeutic Activity: 8-22 mins PT General Charges $$ ACUTE PT VISIT: 1 Visit                    Orysia Blas, PT, DPT Secure Chat Preferred  Rehab Office [phone]   Alissa April Adela Ades 02/06/2024, 9:13 AM

## 2024-02-06 NOTE — Plan of Care (Signed)

## 2024-02-06 NOTE — Progress Notes (Signed)
 NAME:  Mary Acosta, MRN:  [id number], DOB:  April 27, 1956, LOS: 4 ADMISSION DATE:  02/02/2024, CONSULTATION DATE: 6/9 REFERRING MD: Dr. Daivd Dub EDP, CHIEF COMPLAINT: Dyspnea  History of Present Illness:  68 year old woman with past medical history as below, which is significant for chronic pain with narcotic use, COPD, diabetes complicated by diabetic neuropathy, cirrhosis, obstructive sleep apnea (unable to tolerate CPAP), and polymyalgia rheumatica.    Patient is intubated; therefore, history has been obtained from chart review. EMS was called to the house 6/9 for patient having labored respirations and poor responsiveness.  Upon EMS arrival the patient immediately required bag-valve-mask assist ventilations.  EMS administered Narcan  without effect.  With assisted ventilations the patient did gain some level of responsiveness although remained quite poor.  The patient remained dyspneic and poorly responsive upon arrival to the emergency department she was intubated.  Chest x-ray concerning for vascular congestion.  Laboratory evaluation demonstrated hypercapnia.   PCCM asked to admit for respiratory failure.  Pertinent Medical History:   Past Medical History:  Diagnosis Date   AKI (acute kidney injury) (HCC) 01/2017   Allergy    seasonal   Anxiety    with panic attacks   Arthritis    back; feet; hands; shoulders (08/26/2014)   Asthma    Cataract    forming   Cervical cancer (HCC)    Chronic lower back pain    Chronic narcotic use    Chronic pain syndrome    [facility]   Cirrhosis (HCC)    Clostridium difficile infection 2017   COPD (chronic obstructive pulmonary disease) (HCC)    Daily headache    past hx   Depression    Diabetic neuropathy (HCC) 06/04/2017   feet  and legs , some in hands   DJD (degenerative joint disease)    Fatty liver disease, nonalcoholic    Fibromyalgia    Frequency of urination    HCAP (healthcare-associated pneumonia)  08/26/2014   History of TIA (transient ischemic attack) 11/01/2010   NO RESIDUAL   Hyperlipidemia    Hypertension    Hypothyroidism    IDDM (insulin  dependent diabetes mellitus)    Insomnia    Lumbar stenosis    Macular degeneration    Memory difficulty 07/25/2016   Nocturia    OSA (obstructive sleep apnea)    NO CPAP SINCE WT LOSS   Osteoarthritis    with severe disease in knee   Oxygen  deficiency    4  liters at bedtime only   Pneumonia several times   Polymyalgia rheumatica (HCC)    Scoliosis    Seasonal allergies    Stroke (HCC)    TIA   Thyroid  cancer (HCC)    Urgency of urination    Vaginal pain S/P SLING  FEB 2012   Significant Hospital Events: Including procedures, antibiotic start and stop dates in addition to other pertinent events   6/9 - Presented to [facility] ED via EMS for dyspnea, respiratory failure. Intubated in ED. Hypercapnic and hypoxic on gas. Received Narcan  to ensure no component of narcosis. PCCM consulted for ICU admission. 6/11 High BP this am with any sedation wean, tolerated extubation mid morning 6/12 remains on Cleviprex  drip but alert and interactive this a.m.  Interim History / Subjective:  Patient is complaining of bilateral knee pain, requesting lidocaine  patch Still requiring Cleviprex  infusion, currently on 2 mg/h Remained afebrile  Objective:   Blood pressure (!) 153/63, pulse 76, temperature 97.9 F (36.6  C), temperature source Oral, resp. rate 11, height 5' 7 (1.702 m), weight 131.4 kg, SpO2 95%. CVP:  [9 mmHg] 9 mmHg      Intake/Output Summary (Last 24 hours) at 02/06/2024 0809 Last data filed at 02/06/2024 0700 Gross per 24 hour  Intake 470.14 ml  Output 1685 ml  Net -1214.86 ml   Filed Weights   02/04/24 0330 02/05/24 0500 02/06/24 0530  Weight: 132.4 kg 129.2 kg 131.4 kg    Physical Examination: General: Acute on chronically ill-appearing morbidly obese female, lying on the bed HEENT: Central/AT, eyes anicteric.  moist mucus  membranes Neuro: Alert, awake following commands Chest: Reduced air entry at the bases bilaterally, no wheezes or rhonchi Heart: Regular rate and rhythm, no murmurs or gallops Abdomen: Soft, nontender, nondistended, bowel sounds present  Labs and images reviewed   Patient Lines/Drains/Airways Status     Active Line/Drains/Airways     Name Placement date Placement time Site Days   Arterial Line 02/04/24 Right Radial 02/04/24  0603  Radial  2   Peripheral IV 02/02/24 18 G Anterior;Proximal;Right Forearm 02/02/24  1700  Forearm  4   CVC Triple Lumen 02/04/24 Right Internal jugular 02/04/24  0522  -- 2   Urethral Catheter Sherryle Don RN Temperature probe 14 Fr. 02/03/24  1910  Temperature probe  3   Wound / Incision (Open or Dehisced) 02/02/24 Irritant Dermatitis (Moisture Associated Skin Damage) Pelvis Anterior;Bilateral 02/02/24  2149  Pelvis  4         Resolved problem List:  Acute metabolic encephalopathy in the setting of hypercapnia  Assessment and Plan:  Acute on chronic hypoxic/hypercapnic respiratory failure Probable obstructive sleep apnea COPD on home oxygen  at night, without exacerbation Possible right lower lobe pneumonia Uncontrolled hypertension Hyperlipidemia Hypothyroidism post thyroidectomy Poorly controlled diabetes with hyperglycemia, complicated with peripheral neuropathy Nonalcoholic fatty liver disease CKD stage IIIa Chronic pain syndrome on narcotics Insomnia, chronically on benzodiazepines Fibromyalgia Morbid obesity  Patient was successfully extubated 2 days ago, remain on nasal cannula oxygen  She will need sleep studies as an outpatient Continue nebs as needed Continue doxycycline  to complete 5 days therapy Continue to titrate nasal cannula oxygen  with O2 sat goal 92% Patient blood pressure still not well-controlled, still requiring Cleviprex  infusion, currently at 2 mg/h Continue amlodipine , switch Toprol  to Coreg Started on hydralazine  Will  stop clonidine  and hydralazine  considering these medications required multiple dosing, compliance can be an issue Continue levothyroxine  Increase Lantus  to 25 units twice daily Continue sliding scale insulin  Patient hemoglobin A1c is over 11 Serum creatinine is at baseline Avoid narcotic and benzodiazepine Continue Paxil    Best Practice (right click and Reselect all SmartList Selections daily)   Diet/type: Regular consistency DVT prophylaxis subcu Lovenox  GI prophylaxis: PPI Lines: DC TLC and Art line Foley: NA Code Status:  full code Last date of multidisciplinary goals of care discussion: 6/13, patient was updated at bedside   The patient is critically ill due to poorly controlled diabetes with hyperglycemia/uncontrolled hypertension, requiring titration of IV infusion.  Critical care was necessary to treat or prevent imminent or life-threatening deterioration.  Critical care was time spent personally by me on the following activities: development of treatment plan with patient and/or surrogate as well as nursing, discussions with consultants, evaluation of patient's response to treatment, examination of patient, obtaining history from patient or surrogate, ordering and performing treatments and interventions, ordering and review of laboratory studies, ordering and review of radiographic studies, pulse oximetry, re-evaluation of patient's condition and participation  in multidisciplinary rounds.   During this encounter critical care time was devoted to patient care services described in this note for 35 minutes.     Trevor Fudge, MD  Pulmonary Critical Care See Amion for pager If no response to pager, please call [phone] until 7pm After 7pm, Please call E-link [phone]

## 2024-02-06 NOTE — Progress Notes (Signed)
 Inpatient Rehab Admissions Coordinator:   At this time we are recommending a CIR consult.  I will place an order for a consult per our protocol.   Loye Rumble, PT, DPT Admissions Coordinator [phone] 02/06/24  2:02 PM

## 2024-02-06 NOTE — Progress Notes (Signed)
 Patient arrived to unit. No distress noted. No complainants voiced.

## 2024-02-07 DIAGNOSIS — J9602 Acute respiratory failure with hypercapnia: Secondary | ICD-10-CM | POA: Diagnosis not present

## 2024-02-07 DIAGNOSIS — J9601 Acute respiratory failure with hypoxia: Secondary | ICD-10-CM

## 2024-02-07 LAB — CULTURE, BLOOD (ROUTINE X 2)
Culture: NO GROWTH
Culture: NO GROWTH
Special Requests: ADEQUATE
Special Requests: ADEQUATE

## 2024-02-07 LAB — CBC
HCT: 42.6 % (ref 36.0–46.0)
Hemoglobin: 13.2 g/dL (ref 12.0–15.0)
MCH: 24 pg — ABNORMAL LOW (ref 26.0–34.0)
MCHC: 31 g/dL (ref 30.0–36.0)
MCV: 77.3 fL — ABNORMAL LOW (ref 80.0–100.0)
Platelets: 159 10*3/uL (ref 150–400)
RBC: 5.51 MIL/uL — ABNORMAL HIGH (ref 3.87–5.11)
RDW: 15 % (ref 11.5–15.5)
WBC: 7.7 10*3/uL (ref 4.0–10.5)
nRBC: 0 % (ref 0.0–0.2)

## 2024-02-07 LAB — BASIC METABOLIC PANEL WITH GFR
Anion gap: 11 (ref 5–15)
BUN: 24 mg/dL — ABNORMAL HIGH (ref 8–23)
CO2: 30 mmol/L (ref 22–32)
Calcium: 9.8 mg/dL (ref 8.9–10.3)
Chloride: 94 mmol/L — ABNORMAL LOW (ref 98–111)
Creatinine, Ser: 0.88 mg/dL (ref 0.44–1.00)
GFR, Estimated: >60 mL/min (ref >60)
Glucose, Bld: 180 mg/dL — ABNORMAL HIGH (ref 70–99)
Potassium: 4 mmol/L (ref 3.5–5.1)
Sodium: 135 mmol/L (ref 135–145)

## 2024-02-07 LAB — GLUCOSE, CAPILLARY
Glucose-Capillary: 276 mg/dL — ABNORMAL HIGH (ref 70–99)
Glucose-Capillary: 83 mg/dL (ref 70–99)
Glucose-Capillary: 199 mg/dL — ABNORMAL HIGH (ref 70–99)
Glucose-Capillary: 243 mg/dL — ABNORMAL HIGH (ref 70–99)

## 2024-02-07 LAB — MAGNESIUM: Magnesium: 1.8 mg/dL (ref 1.7–2.4)

## 2024-02-07 LAB — PHOSPHORUS: Phosphorus: 2.9 mg/dL (ref 2.5–4.6)

## 2024-02-07 NOTE — Plan of Care (Signed)
 Pt has rested quietly throughout the night with no distress noted. Alert and oriented. On room air. SR on the monitor. Up to BR with 1 assist and walker. No complaints voiced.     Problem: Tissue Perfusion: Goal: Adequacy of tissue perfusion will improve Outcome: Progressing   Problem: Health Behavior/Discharge Planning: Goal: Ability to manage health-related needs will improve Outcome: Progressing   Problem: Clinical Measurements: Goal: Respiratory complications will improve Outcome: Progressing Goal: Cardiovascular complication will be avoided Outcome: Progressing   Problem: Coping: Goal: Level of anxiety will decrease Outcome: Progressing   Problem: Pain Managment: Goal: General experience of comfort will improve and/or be controlled Outcome: Progressing

## 2024-02-07 NOTE — Plan of Care (Signed)

## 2024-02-07 NOTE — Progress Notes (Signed)
 PROGRESS NOTE    Mary Acosta  ZOX:[id number] DOB: Feb 17, 1956 DOA: 02/02/2024 PCP: Colene Dauphin, MD  Chief Complaint  Patient presents with   Seizures    Brief Narrative:   68 year old woman with past medical history as below, which is significant for chronic pain with narcotic use, COPD, diabetes complicated by diabetic neuropathy, cirrhosis, obstructive sleep apnea (unable to tolerate CPAP), and polymyalgia rheumatica.     Patient is intubated; therefore, history has been obtained from chart review. EMS was called to the house 6/9 for patient having labored respirations and poor responsiveness.  Upon EMS arrival the patient immediately required bag-valve-mask assist ventilations.  EMS administered Narcan  without effect.  With assisted ventilations the patient did gain some level of responsiveness although remained quite poor.  The patient remained dyspneic and poorly responsive upon arrival to the emergency department she was intubated.  Chest x-ray concerning for vascular congestion.  Laboratory evaluation demonstrated hypercapnia.  Patient admitted to ICU by PCCM team.,  Transferred to progressive care on 02/07/2023.  Significant Hospital Events: Including procedures, antibiotic start and stop dates in addition to other pertinent events   6/9 - Presented to [facility] ED via EMS for dyspnea, respiratory failure. Intubated in ED. Hypercapnic and hypoxic on gas. Received Narcan  to ensure no component of narcosis. PCCM consulted for ICU admission. 6/11 High BP this am with any sedation wean, tolerated extubation mid morning 6/12 remains on Cleviprex  drip but alert and interactive this a.m. 6/14 transferred out of ICU to progressive care  Assessment & Plan:   Principal Problem:   Acute respiratory failure with hypoxia and hypercarbia (HCC)   Acute on chronic hypoxic and hypercarbic respiratory failure  Decompensated OHS History of COPD on nocturnal O2 not in acute exacerbation on  admission Right lower lobe pneumonia -With history of OHS, with multiple studies in the past, but she has been very intolerant to BiPAP despite multiple attempts. -Continue with empiric doxycycline  to treat pneumonia. -Wean supplemental oxygen  as able -Mobilize -Aspiration precautions -Encourage pulmonary hygiene -Slowly resume home narcotics/antianxiety medications to decrease potential oversedation/polypharmacy   History of TIA -Supportive care   HTN HLD -Home medications appear to include Norvasc , clonidine , Lasix , hydralazine , Toprol  XL, simvastatin  - off  Cleviprex  currently - Blood pressure controlled on amlodipine , Coreg - Continue with as needed labetalol  and hydralazine   Hypothyroidism; h/o thyroid  cancer s/p resection\ -Continue with Synthroid    Type 2 diabetes -Uncontrolled, A1c 11.5, Home medications include semaglutide , Tresiba , and metformin  -Continue with insulin  sliding scale and long-acting insulin , adjust as needed  Nonalcoholic fatty liver disease -Avoid hepatotoxins - Outpatient follow-up   Chronic low back pain Chronic narcotic & benzo use Polymyalgia rheumatica Fibromyalgia Anxiety -Home medications include buprenorphine, BuSpar , Celexa, Klonopin , Paxil , trazodone -She has no complaints of pain or anxiety currently, resume at much lower dose once more stable, she is currently on Paxil  and gabapentin   Hyperlipidemia - Continue with home statin       DVT prophylaxis: Lovenox  Code Status: Full code Family Communication: None at bedside Disposition:   Status is: Inpatient    Consultants:  Admitted by PCCM  Subjective:  Reports good night sleep, denies any complaints today. Objective: Vitals:   02/06/24 2300 02/07/24 0300 02/07/24 0400 02/07/24 0744  BP: (!) 142/76  (!) 147/60 136/64  Pulse: 72 71 71   Resp: 18 18 18    Temp: 98.2 F (36.8 C)  98 F (36.7 C) 98.4 F (36.9 C)  TempSrc: Oral  Oral Oral  SpO2:  92% 90% 90% 91%   Weight:      Height:        Intake/Output Summary (Last 24 hours) at 02/07/2024 1029 Last data filed at 02/07/2024 0749 Gross per 24 hour  Intake 1002.97 ml  Output --  Net 1002.97 ml   Filed Weights   02/04/24 0330 02/05/24 0500 02/06/24 0530  Weight: 132.4 kg 129.2 kg 131.4 kg    Examination:  Awake Alert, Oriented X 3, No new F.N deficits, Normal affect Symmetrical Chest wall movement, Good air movement bilaterally, CTAB RRR,No Gallops,Rubs or new Murmurs, No Parasternal Heave +ve B.Sounds, Abd Soft, No tenderness, No rebound - guarding or rigidity. No Cyanosis, Clubbing or edema, No new Rash or bruise     Data Reviewed: I have personally reviewed following labs and imaging studies  CBC: Recent Labs  Lab 02/02/24 1700 02/02/24 1751 02/02/24 1800 02/02/24 2332 02/03/24 0951 02/04/24 0513 02/07/24 0709  WBC 13.0*  --   --   --  12.3* 14.5* 7.7  NEUTROABS 10.1*  --   --   --   --  9.7*  --   HGB 15.8*   < > 17.0*  17.7* 15.0 15.8* 14.5 13.2  HCT 51.4*   < > 50.0*  52.0* 44.0 51.3* 46.0 42.6  MCV 79.1*  --   --   --  78.2* 77.2* 77.3*  PLT 167  --   --   --  162 223 159   < > = values in this interval not displayed.    Basic Metabolic Panel: Recent Labs  Lab 02/02/24 1700 02/02/24 1751 02/02/24 1800 02/02/24 2332 02/03/24 0951 02/03/24 0951 02/03/24 1658 02/04/24 0513 02/04/24 1606 02/05/24 0454 02/07/24 0709  NA 140   < > 141  140 139 140  --   --  136  --  138 135  K 4.6   < > 4.8  4.8 4.1 4.0  --   --  3.6  --  3.9 4.0  CL 99  --  102  --  101  --   --  100  --  101 94*  CO2 25  --   --   --  23  --   --  24  --  26 30  GLUCOSE 155*  --  163*  --  249*  --   --  365*  --  275* 180*  BUN 17  --  24*  --  17  --   --  24*  --  27* 24*  CREATININE 1.04*  --  1.20*  --  1.32*  --   --  1.27*  --  1.04* 0.88  CALCIUM  9.8  --   --   --  9.9  --   --  8.9  --  9.2 9.8  MG  --   --   --   --  1.7   < > 2.0 1.7 1.9 1.7 1.8  PHOS  --   --   --   --   3.5   < > 5.1* 4.7* 4.6 3.4 2.9   < > = values in this interval not displayed.    GFR: Estimated Creatinine Clearance: 87.6 mL/min (by C-G formula based on SCr of 0.88 mg/dL).  Liver Function Tests: Recent Labs  Lab 02/02/24 1700  AST 30  ALT 27  ALKPHOS 75  BILITOT 1.3*  PROT 7.5  ALBUMIN 3.9    CBG: Recent Labs  Lab 02/06/24 0728  02/06/24 1102 02/06/24 1607 02/06/24 2117 02/07/24 0927  GLUCAP 219* 307* 150* 139* 276*     Recent Results (from the past 240 hours)  Culture, blood (Routine x 2)     Status: None   Collection Time: 02/02/24  4:55 PM   Specimen: BLOOD  Result Value Ref Range Status   Specimen Description BLOOD SITE NOT SPECIFIED  Final   Special Requests   Final    BOTTLES DRAWN AEROBIC AND ANAEROBIC Blood Culture adequate volume   Culture   Final    NO GROWTH 5 DAYS Performed at [facility], [address] [place], [place] [address]    Report Status 02/07/2024 FINAL  Final  Culture, blood (Routine x 2)     Status: None   Collection Time: 02/02/24  5:10 PM   Specimen: BLOOD  Result Value Ref Range Status   Specimen Description BLOOD SITE NOT SPECIFIED  Final   Special Requests   Final    BOTTLES DRAWN AEROBIC AND ANAEROBIC Blood Culture adequate volume   Culture   Final    NO GROWTH 5 DAYS Performed at [facility], [address] [place], [place] [address]    Report Status 02/07/2024 FINAL  Final  MRSA Next Gen by PCR, Nasal     Status: Abnormal   Collection Time: 02/02/24  9:10 PM   Specimen: Nasal Mucosa; Nasal Swab  Result Value Ref Range Status   MRSA by PCR Next Gen DETECTED (A) NOT DETECTED Final    Comment: RESULTS CALLED TO, READ BACK BY AND VERIFIED WITH RN M.DUNN ON 02/03/24 AT 0208 BY NM (NOTE) The GeneXpert MRSA Assay (FDA approved for NASAL specimens only), is one component of a comprehensive MRSA colonization surveillance program. It is not intended to diagnose MRSA infection nor to guide or monitor treatment  for MRSA infections. Test performance is not FDA approved in patients less than 15 years old. Performed at [facility], [address] [place], [place] [address]   Resp panel by RT-PCR (RSV, Flu A&B, Covid) Anterior Nasal Swab     Status: None   Collection Time: 02/02/24  9:31 PM   Specimen: Anterior Nasal Swab  Result Value Ref Range Status   SARS Coronavirus 2 by RT PCR NEGATIVE NEGATIVE Final   Influenza A by PCR NEGATIVE NEGATIVE Final   Influenza B by PCR NEGATIVE NEGATIVE Final    Comment: (NOTE) The Xpert Xpress SARS-CoV-2/FLU/RSV plus assay is intended as an aid in the diagnosis of influenza from Nasopharyngeal swab specimens and should not be used as a sole basis for treatment. Nasal washings and aspirates are unacceptable for Xpert Xpress SARS-CoV-2/FLU/RSV testing.  Fact Sheet for Patients: BloggerCourse.com  Fact Sheet for Healthcare Providers: SeriousBroker.it  This test is not yet approved or cleared by the [place]  FDA and has been authorized for detection and/or diagnosis of SARS-CoV-2 by FDA under an Emergency Use Authorization (EUA). This EUA will remain in effect (meaning this test can be used) for the duration of the COVID-19 declaration under Section 564(b)(1) of the Act, 21 U.S.C. section 360bbb-3(b)(1), unless the authorization is terminated or revoked.     Resp Syncytial Virus by PCR NEGATIVE NEGATIVE Final    Comment: (NOTE) Fact Sheet for Patients: BloggerCourse.com  Fact Sheet for Healthcare Providers: SeriousBroker.it  This test is not yet approved or cleared by the [place]  FDA and has been authorized for detection and/or diagnosis of SARS-CoV-2 by FDA under an Emergency  Use Authorization (EUA). This EUA will remain in effect (meaning this test can be used) for the duration of the COVID-19 declaration under Section 564(b)(1) of  the Act, 21 U.S.C. section 360bbb-3(b)(1), unless the authorization is terminated or revoked.  Performed at [facility], [address], [place], [place] [address]          Radiology Studies: No results found.      Scheduled Meds:  amLODipine   10 mg Oral Daily   carvedilol  25 mg Oral BID WC   Chlorhexidine  Gluconate Cloth  6 each Topical Daily   doxycycline   100 mg Oral Q12H   enoxaparin  (LOVENOX ) injection  40 mg Subcutaneous QHS   feeding supplement  237 mL Oral BID BM   gabapentin   300 mg Oral BID   hydrochlorothiazide   25 mg Oral Daily   insulin  aspart  0-15 Units Subcutaneous TID WC   insulin  aspart  6 Units Subcutaneous TID WC   insulin  glargine-yfgn  25 Units Subcutaneous BID   levothyroxine   175 mcg Oral Q0600   lidocaine   1 patch Transdermal Q24H   And   lidocaine   1 patch Transdermal Q24H   multivitamin with minerals  1 tablet Oral Daily   PARoxetine   20 mg Oral QHS   PARoxetine   40 mg Oral Daily   simvastatin   20 mg Oral QHS   Continuous Infusions:  clevidipine  Stopped (02/06/24 0810)     LOS: 5 days       Seena Dadds, MD [facility]   To contact the attending provider between 7A-7P or the covering provider during after hours 7P-7A, please log into the web site [email or web address] and access using universal [place] password for that web site. If you do not have the password, please call the hospital operator.  02/07/2024, 10:29 AM

## 2024-02-07 NOTE — TOC Progression Note (Signed)
 Transition of Care [facility]) - Progression Note    Patient Details  Name: Margaux Engen MRN: [id number] Date of Birth: [birth year]  [facility]) CM/SW Contact  Omie Bickers, RN Phone Number: 02/07/2024, 1:06 PM  Clinical Narrative:     Spoke w patient at bedside after notified by CIR that she is declining it and prefering to go home.  She has needed DME at home, may need home oxygen  order updated with provider if needs are greater at Dc than they were at admission.  She would like like [facility] and referral is pending.   Expected Discharge Plan:  (TBD) Barriers to Discharge: Continued Medical Work up  Expected Discharge Plan and Services   Discharge Planning Services: CM Consult   Living arrangements for the past 2 months: Single Family Home                           HH Arranged: PT, OT HH Agency: [facility] Date [facility] Agency Contacted: 02/07/24 Time HH Agency Contacted: [phone] Representative spoke with at [facility] Agency: Baruch Likens   Social Determinants of Health (SDOH) Interventions SDOH Screenings   Food Insecurity: No Food Insecurity (12/23/2023)  Housing: Unknown (12/23/2023)  Transportation Needs: No Transportation Needs (12/23/2023)  Utilities: Not At Risk (12/23/2023)  Alcohol Screen: Low Risk  (12/23/2023)  Depression (PHQ2-9): Medium Risk (12/23/2023)  Financial Resource Strain: Low Risk  (12/23/2023)  Physical Activity: Inactive (12/23/2023)  Social Connections: Moderately Isolated (12/23/2023)  Stress: Stress Concern Present (12/23/2023)  Tobacco Use: Medium Risk (02/02/2024)  Health Literacy: Adequate Health Literacy (12/23/2023)    Readmission Risk Interventions     No data to display

## 2024-02-07 NOTE — Progress Notes (Signed)
 Inpatient Rehab Admissions:  Inpatient Rehab Consult received.  I met with patient at the bedside for rehabilitation assessment and to discuss goals and expectations of an inpatient rehab admission.  Discussed average length of stay, insurance authorization requirement and discharge home after completion of CIR. Pt acknowledged understanding. Pt is not interested in CIR. Pt would prefer HH. TOC made aware.  Signed: Artemus Larsen, MS, CCC-SLP Admissions Coordinator [phone]

## 2024-02-08 DIAGNOSIS — J9601 Acute respiratory failure with hypoxia: Secondary | ICD-10-CM | POA: Diagnosis not present

## 2024-02-08 DIAGNOSIS — J9602 Acute respiratory failure with hypercapnia: Secondary | ICD-10-CM | POA: Diagnosis not present

## 2024-02-08 LAB — CBC
HCT: 45.9 % (ref 36.0–46.0)
Hemoglobin: 14.5 g/dL (ref 12.0–15.0)
MCH: 24.4 pg — ABNORMAL LOW (ref 26.0–34.0)
MCHC: 31.6 g/dL (ref 30.0–36.0)
MCV: 77.1 fL — ABNORMAL LOW (ref 80.0–100.0)
Platelets: 179 10*3/uL (ref 150–400)
RBC: 5.95 MIL/uL — ABNORMAL HIGH (ref 3.87–5.11)
RDW: 14.8 % (ref 11.5–15.5)
WBC: 7.9 10*3/uL (ref 4.0–10.5)
nRBC: 0 % (ref 0.0–0.2)

## 2024-02-08 LAB — BASIC METABOLIC PANEL WITH GFR
Anion gap: 13 (ref 5–15)
BUN: 18 mg/dL (ref 8–23)
CO2: 31 mmol/L (ref 22–32)
Calcium: 10.1 mg/dL (ref 8.9–10.3)
Chloride: 91 mmol/L — ABNORMAL LOW (ref 98–111)
Creatinine, Ser: 0.87 mg/dL (ref 0.44–1.00)
GFR, Estimated: >60 mL/min (ref >60)
Glucose, Bld: 143 mg/dL — ABNORMAL HIGH (ref 70–99)
Potassium: 4 mmol/L (ref 3.5–5.1)
Sodium: 135 mmol/L (ref 135–145)

## 2024-02-08 LAB — MAGNESIUM: Magnesium: 1.7 mg/dL (ref 1.7–2.4)

## 2024-02-08 LAB — GLUCOSE, CAPILLARY: Glucose-Capillary: 163 mg/dL — ABNORMAL HIGH (ref 70–99)

## 2024-02-08 LAB — PHOSPHORUS: Phosphorus: 3.8 mg/dL (ref 2.5–4.6)

## 2024-02-08 MED ORDER — CARVEDILOL 25 MG PO TABS
25.0000 mg | ORAL_TABLET | Freq: Two times a day (BID) | ORAL | 0 refills | Status: AC
Start: 2024-02-08 — End: ?

## 2024-02-08 MED ORDER — AMLODIPINE BESYLATE 10 MG PO TABS: 10.0000 mg | ORAL_TABLET | Freq: Every day | ORAL | 0 refills | Status: DC

## 2024-02-08 MED ORDER — TOUJEO SOLOSTAR 300 UNIT/ML ~~LOC~~ SOPN: 25.0000 [IU] | PEN_INJECTOR | Freq: Two times a day (BID) | SUBCUTANEOUS | Status: DC

## 2024-02-08 MED ORDER — CALCIUM CARBONATE ANTACID 500 MG PO CHEW
1.0000 | CHEWABLE_TABLET | Freq: Two times a day (BID) | ORAL | Status: DC | PRN
  Administered 2024-02-08: 200 mg via ORAL
  Filled 2024-02-08: qty 1

## 2024-02-08 MED ORDER — HYDROCHLOROTHIAZIDE 25 MG PO TABS: 25.0000 mg | ORAL_TABLET | Freq: Every day | ORAL | 0 refills | Status: AC

## 2024-02-08 NOTE — Progress Notes (Signed)
 TRH night cross cover note:   I was notified by the patient's RN of bladder scan result of 571 cc's. Patient without urge to void at this time. I have ordered straight cath x 1 now.     Camelia Cavalier, DO Hospitalist

## 2024-02-08 NOTE — Progress Notes (Signed)
   02/08/24 0900  Mobility  Activity Ambulated with assistance in room  Level of Assistance Standby assist, set-up cues, supervision of patient - no hands on  Assistive Device Front wheel walker  Distance Ambulated (ft)  (Ambulated to nurses station and back)  Activity Response Tolerated well   Patient ambulated from her room to the nurses station and back using a front wheel walker. Tolerated well.

## 2024-02-08 NOTE — Progress Notes (Signed)
 TRH night cross cover note:   Prn Tums added for patient's report of heartburn.     Camelia Cavalier, DO Hospitalist

## 2024-02-08 NOTE — Progress Notes (Signed)
 Dr.Howerter was made aware that pt  c/o heartburn. Pt was given  tums as ordered.

## 2024-02-08 NOTE — Discharge Summary (Signed)
 Physician Discharge Summary  Mary Acosta NWG:[id number] DOB: 12-22-1955 DOA: 02/02/2024  PCP: Colene Dauphin, MD  Admit date: 02/02/2024 Discharge date: 02/08/2024  Admitted From: (Home) Disposition:  (Home)  Recommendations for Outpatient Follow-up:  Follow up with PCP in 1-2 weeks Please obtain BMP/CBC in one week Instructed to follow-up with sleep medicine as an outpatient  Home Health: (YES>> resume home health PT, OT, and add RN)  Diet recommendation: Heart Healthy / Carb Modified  Brief/Interim Summary:  68 year old woman with past medical history as below, which is significant for chronic pain with narcotic use, COPD, diabetes complicated by diabetic neuropathy, cirrhosis, obstructive sleep apnea (unable to tolerate CPAP), and polymyalgia rheumatica.     Patient is intubated; therefore, history has been obtained from chart review. EMS was called to the house 6/9 for patient having labored respirations and poor responsiveness.  Upon EMS arrival the patient immediately required bag-valve-mask assist ventilations.  EMS administered Narcan  without effect.  With assisted ventilations the patient did gain some level of responsiveness although remained quite poor.  The patient remained dyspneic and poorly responsive upon arrival to the emergency department she was intubated.  Chest x-ray concerning for vascular congestion.  Laboratory evaluation demonstrated hypercapnia.  Patient admitted to ICU by PCCM team.,  Transferred to progressive care on 02/07/2023.   Significant Hospital Events: Including procedures, antibiotic start and stop dates in addition to other pertinent events   6/9 - Presented to [facility] ED via EMS for dyspnea, respiratory failure. Intubated in ED. Hypercapnic and hypoxic on gas. Received Narcan  to ensure no component of narcosis. PCCM consulted for ICU admission. 6/11 High BP this am with any sedation wean, tolerated extubation mid morning 6/12 remains on Cleviprex   drip but alert and interactive this a.m. 6/14 transferred out of ICU to progressive care     Acute on chronic hypoxic and hypercarbic respiratory failure  Decompensated OHS History of COPD on nocturnal O2 not in acute exacerbation on admission Right lower lobe pneumonia Acute encephalopathy secondary to hypercapnia -With history of OHS, with multiple studies in the past, but she has been very intolerant to BiPAP despite multiple attempts. -treated with doxycycline  for pneumonia. -Wean supplemental oxygen  as able, she is on oxygen  at baseline -Encourage pulmonary hygiene -Slowly resume home narcotics/antianxiety medications to decrease potential oversedation/polypharmacy  Acute  encephalopathy, both toxic and hypercapnic Polypharmacy - Please see discussion below under hypercapnia-she is with polypharmacy, very poor historian on multiple medications, they have been held during hospital stay, she is not clear what she is taking and what she is not, so she has been resumed on minimal medications at time of discharge to avoid side effects   History of TIA -Supportive care   HTN HLD -Home medications appear to include Norvasc , clonidine , Lasix , hydralazine , Toprol  XL, simvastatin , but as well unsure of her compliance - He required Cleviprex  drip while in ICU - Blood pressure controlled on amlodipine , Coreg   Hypothyroidism; h/o thyroid  cancer s/p resection\ -Continue with Synthroid    Type 2 diabetes -Uncontrolled, A1c 11.5, Home medications include semaglutide , Tresiba , and metformin  -Continue with insulin  sliding scale and long-acting insulin , adjust as needed   Nonalcoholic fatty liver disease -Avoid hepatotoxins - Outpatient follow-up   Chronic low back pain Chronic narcotic & benzo use Polymyalgia rheumatica Fibromyalgia Anxiety -Home medications include buprenorphine, BuSpar , Celexa, Klonopin , Paxil , trazodone -She has no complaints of pain or anxiety currently, resume  at much lower dose once more stable, she is currently on Paxil  and gabapentin   Hyperlipidemia - Continue with home statin      Discharge Diagnoses:  Principal Problem:   Acute respiratory failure with hypoxia and hypercarbia (HCC) Active Problems:   Obesity   POLYMYALGIA RHEUMATICA   Hypothyroidism   Vitamin B12 deficiency   Poorly controlled type 2 diabetes mellitus with circulatory disorder [facility])    Discharge Instructions  Discharge Instructions     Diet - low sodium heart healthy   Complete by: As directed    Discharge instructions   Complete by: As directed    Follow with Primary MD Colene Dauphin, MD in 7 days   Get CBC, CMP, 2 view Chest X ray checked  by Primary MD next visit.    Activity: As tolerated with Full fall precautions use walker/cane & assistance as needed   Disposition Home   Diet: Heart Healthy /carb modified , with feeding assistance and aspiration precautions.  For Heart failure patients - Check your Weight same time everyday, if you gain over 2 pounds, or you develop in leg swelling, experience more shortness of breath or chest pain, call your Primary MD immediately. Follow Cardiac Low Salt Diet and 1.5 lit/day fluid restriction.   On your next visit with your primary care physician please Get Medicines reviewed and adjusted.   Please request your Prim.MD to go over all Hospital Tests and Procedure/Radiological results at the follow up, please get all Hospital records sent to your Prim MD by signing hospital release before you go home.   If you experience worsening of your admission symptoms, develop shortness of breath, life threatening emergency, suicidal or homicidal thoughts you must seek medical attention immediately by calling 911 or calling your MD immediately  if symptoms less severe.  You Must read complete instructions/literature along with all the possible adverse reactions/side effects for all the Medicines you take and that have  been prescribed to you. Take any new Medicines after you have completely understood and accpet all the possible adverse reactions/side effects.   Do not drive, operating heavy machinery, perform activities at heights, swimming or participation in water  activities or provide baby sitting services if your were admitted for syncope or siezures until you have seen by Primary MD or a Neurologist and advised to do so again.  Do not drive when taking Pain medications.    Do not take more than prescribed Pain, Sleep and Anxiety Medications  Special Instructions: If you have smoked or chewed Tobacco  in the last 2 yrs please stop smoking, stop any regular Alcohol  and or any Recreational drug use.  Wear Seat belts while driving.   Please note  You were cared for by a hospitalist during your hospital stay. If you have any questions about your discharge medications or the care you received while you were in the hospital after you are discharged, you can call the unit and asked to speak with the hospitalist on call if the hospitalist that took care of you is not available. Once you are discharged, your primary care physician will handle any further medical issues. Please note that NO REFILLS for any discharge medications will be authorized once you are discharged, as it is imperative that you return to your primary care physician (or establish a relationship with a primary care physician if you do not have one) for your aftercare needs so that they can reassess your need for medications and monitor your lab values.   Increase activity slowly   Complete by: As directed  No wound care   Complete by: As directed       Allergies as of 02/08/2024       Reactions   Gabapentin  Swelling   Swelling in legs   Losartan Other (See Comments)   Myalgias and muscle cramping   Aricept  [donepezil  Hcl]    Nausea/vomiting, low BP   Aripiprazole Other (See Comments)   Patient reports Hypoglycemia   Hydroxyzine      hallucinations   Oxycodone-acetaminophen  Itching   Oxycodone Itching   Sulfa Antibiotics Nausea Only, Rash   Sulfonamide Derivatives Nausea Only, Rash        Medication List     STOP taking these medications    atomoxetine 40 MG capsule Commonly known as: STRATTERA   Buprenorphine HCl 300 MCG Film   busPIRone  15 MG tablet Commonly known as: BUSPAR    celecoxib  100 MG capsule Commonly known as: CELEBREX    clonazePAM  0.5 MG tablet Commonly known as: KLONOPIN    cloNIDine  0.1 MG tablet Commonly known as: CATAPRES    Humira (2 Pen) 40 MG/0.4ML pen Generic drug: adalimumab   hydrALAZINE  25 MG tablet Commonly known as: APRESOLINE    hydrOXYzine  50 MG capsule Commonly known as: VISTARIL    metoprolol  succinate 25 MG 24 hr tablet Commonly known as: TOPROL -XL   potassium chloride  SA 20 MEQ tablet Commonly known as: Klor-Con  M20   promethazine  25 MG tablet Commonly known as: PHENERGAN    tiZANidine  4 MG tablet Commonly known as: ZANAFLEX    Tresiba  FlexTouch 200 UNIT/ML FlexTouch Pen Generic drug: insulin  degludec       TAKE these medications    albuterol  108 (90 Base) MCG/ACT inhaler Commonly known as: VENTOLIN  HFA INHALE TWO (2) PUFFS BY MOUTH EVERY 6 HOURS AS NEEDED FOR SHORTNESS OF BREATH AND WHEEZING   amLODipine  10 MG tablet Commonly known as: NORVASC  Take 1 tablet (10 mg total) by mouth daily. Start taking on: February 09, 2024 What changed:  medication strength See the new instructions.   baclofen 10 MG tablet Commonly known as: LIORESAL Take 10 mg by mouth 2 (two) times daily.   BD Pen Needle Nano U/F 32G X 4 MM Misc Generic drug: Insulin  Pen Needle USE AS DIRECTED FIVE TIMES A DAY   carvedilol 25 MG tablet Commonly known as: COREG Take 1 tablet (25 mg total) by mouth 2 (two) times daily with a meal.   colchicine  0.6 MG tablet Take 1 tablet (0.6 mg total) by mouth daily.   ergocalciferol  1.25 MG (50000 UT) capsule Commonly known as:  VITAMIN D2 Take 1 capsule (50,000 Units total) by mouth once a week. Monday What changed:  when to take this additional instructions   fexofenadine 180 MG tablet Commonly known as: ALLEGRA Take 180 mg by mouth daily as needed for allergies or rhinitis.   folic acid 1 MG tablet Commonly known as: FOLVITE Take 1 mg by mouth daily.   FreeStyle Libre 3 Sensor Misc 1 each by Does not apply route every 14 (fourteen) days.   Dexcom G7 Sensor Misc 3 each by Does not apply route every 30 (thirty) days. Apply 1 sensor every 10 days   furosemide  40 MG tablet Commonly known as: LASIX  TAKE 1 TABLET BY MOUTH EACH MORNING   gabapentin  300 MG capsule Commonly known as: NEURONTIN  Take 300 mg by mouth 2 (two) times daily.   hydrochlorothiazide  25 MG tablet Commonly known as: HYDRODIURIL  Take 1 tablet (25 mg total) by mouth daily. Start taking on: February 09, 2024   Insulin  Syringe-Needle  U-100 26G X 1/2 1 ML Misc Use daily for insulin  injection as directed   ipratropium-albuterol  0.5-2.5 (3) MG/3ML Soln Commonly known as: DUONEB Take 3 mLs by nebulization every 6 (six) hours as needed (Wheezing or dyspnea.).   Combivent  Respimat 20-100 MCG/ACT Aers respimat Generic drug: Ipratropium-Albuterol  Inhale 1 puff into the lungs every 6 (six) hours as needed for wheezing.   levothyroxine  175 MCG tablet Commonly known as: SYNTHROID  TAKE 1 TABLET BY MOUTH ONCE DAILY BEFORE BREAKFAST   lidocaine  5 % Commonly known as: LIDODERM  Place 1 patch onto the skin daily. Patient uses these patches on her knees.   metFORMIN  500 MG tablet Commonly known as: GLUCOPHAGE  TAKE 1 TABLET BY MOUTH EACH MORNING AND TAKE 1 TABLET EVERY DAY AT BEDTIME   naloxone  4 MG/0.1ML Liqd nasal spray kit Commonly known as: NARCAN  One spray in either nostril once for known/suspected opioid overdose. May repeat every 2-3 minutes in alternating nostril til EMS arrives   Ozempic  (1 MG/DOSE) 4 MG/3ML Sopn Generic drug:  Semaglutide  (1 MG/DOSE) INJECT 1MG  INTO THE SKIN ONCE A WEEK   PARoxetine  40 MG tablet Commonly known as: PAXIL  Take 1 tablet (40 mg total) by mouth daily.   PARoxetine  20 MG tablet Commonly known as: PAXIL  Take 20 mg by mouth daily.   simvastatin  20 MG tablet Commonly known as: ZOCOR  TAKE 1 TABLET BY MOUTH EVERY DAY AT BEDTIME   Systane Balance 0.6 % Soln Generic drug: Propylene Glycol Place 1 drop into both eyes daily as needed (dry eyes).   Toujeo  SoloStar 300 UNIT/ML Solostar Pen Generic drug: insulin  glargine (1 Unit Dial) Inject 25 Units into the skin 2 (two) times daily. What changed:  how much to take when to take this        Allergies  Allergen Reactions   Gabapentin  Swelling    Swelling in legs    Losartan Other (See Comments)    Myalgias and muscle cramping    Aricept  [Donepezil  Hcl]     Nausea/vomiting, low BP   Aripiprazole Other (See Comments)    Patient reports Hypoglycemia   Hydroxyzine      hallucinations   Oxycodone-Acetaminophen  Itching   Oxycodone Itching   Sulfa Antibiotics Nausea Only and Rash   Sulfonamide Derivatives Nausea Only and Rash    Consultations: PCCM   Procedures/Studies: DG CHEST PORT 1 VIEW Result Date: 02/04/2024 CLINICAL DATA:  68 year old female intubated.  Found unresponsive. EXAM: PORTABLE CHEST 1 VIEW COMPARISON:  Portable chest yesterday and earlier. FINDINGS: Portable AP supine view at 0506 hours. New left IJ approach central line in place, tip at the lower SVC level. Otherwise stable lines and tubes. Stable lung volumes, but veiling opacity now at both lung bases. Aerated lung is without pulmonary edema. No pneumothorax. No air bronchograms. Stable cardiac size and mediastinal contours. IMPRESSION: 1. New left IJ approach central line, tip at the lower SVC level. No pneumothorax. Otherwise stable lines and tubes. 2. Veiling bilateral lung base opacity now suggesting pleural effusions. No other acute pulmonary  abnormality. Recommend imaging confirmation of adequate fluid volume prior to any attempted thoracentesis. Electronically Signed   By: Marlise Simpers M.D.   On: 02/04/2024 05:26   MR BRAIN WO CONTRAST Result Date: 02/03/2024 EXAM DESCRIPTION: MR BRAIN WO CONTRAST CLINICAL HISTORY: Encephalopathy (Ped 0-17y); concern for cerebellar stroke COMPARISON: None available TECHNIQUE: Non contrast MRI of the brain is performed according to our usual protocol including multiplanar multi sequence technique. FINDINGS: No acute intracranial hemorrhage, mass, edema, or  hydrocephalus. Mild to moderate cortical atrophy. No encephalomalacia defects. No white matter disease. The vascular flow voids are unremarkable. Mild-to-moderate pansinusitis greatest to the sphenoids and ethmoids. IMPRESSION: No acute intracranial pathology. Mild/moderate cortical atrophy. Mild to moderate pansinusitis. Electronically signed by: Basilio Both MD 02/03/2024 07:30 PM EDT RP Workstation: [id number]   ECHOCARDIOGRAM COMPLETE Result Date: 02/03/2024    ECHOCARDIOGRAM REPORT   Patient Name:   Mary Acosta Date of Exam: 02/03/2024 Medical Rec #:  [id number]             Height:       67.0 in Accession #:    [id number]            Weight:       292.1 lb Date of Birth:  Jan 15, 1956            BSA:          2.375 m Patient Age:    67 years              BP:           115/67 mmHg Patient Gender: F                     HR:           91 bpm. Exam Location:  Inpatient Procedure: 2D Echo, Cardiac Doppler and Color Doppler (Both Spectral and Color            Flow Doppler were utilized during procedure). Indications:    R94.31 Abnormal EKG  History:        Patient has prior history of Echocardiogram examinations, most                 recent 11/14/2022.  Sonographer:    Andrena Bang Referring Phys: Ronnell Coins REESE  Sonographer Comments: Difficult study due to body habitus and suboptimal positioning. IMPRESSIONS  1. Left ventricular ejection fraction, by  estimation, is >75%. The left ventricle has hyperdynamic function. The left ventricle has no regional wall motion abnormalities. Left ventricular diastolic parameters are indeterminate.  2. Right ventricular systolic function is normal. The right ventricular size is normal.  3. The mitral valve is normal in structure. No evidence of mitral valve regurgitation. No evidence of mitral stenosis.  4. The aortic valve was not well visualized. Aortic valve regurgitation is not visualized.  5. The inferior vena cava is normal in size with greater than 50% respiratory variability, suggesting right atrial pressure of 3 mmHg. Comparison(s): No significant change from prior study. Prior images reviewed side by side. FINDINGS  Left Ventricle: No strain or 3D transmitted. Left ventricular ejection fraction, by estimation, is >75%. The left ventricle has hyperdynamic function. The left ventricle has no regional wall motion abnormalities. Definity  contrast agent was given IV to delineate the left ventricular endocardial borders. Strain was performed and the global longitudinal strain is indeterminate. The left ventricular internal cavity size was normal in size. There is no left ventricular hypertrophy. Left ventricular diastolic parameters are indeterminate. Right Ventricle: The right ventricular size is normal. No increase in right ventricular wall thickness. Right ventricular systolic function is normal. Left Atrium: Left atrial size was normal in size. Right Atrium: Right atrial size was normal in size. Pericardium: There is no evidence of pericardial effusion. Mitral Valve: The mitral valve is normal in structure. No evidence of mitral valve regurgitation. No evidence of mitral valve stenosis. Tricuspid Valve: The tricuspid valve is not well visualized. Tricuspid valve  regurgitation is not demonstrated. No evidence of tricuspid stenosis. Aortic Valve: The aortic valve was not well visualized. Aortic valve regurgitation is not  visualized. Aortic valve mean gradient measures 2.0 mmHg. Aortic valve peak gradient measures 5.4 mmHg. Aortic valve area, by VTI measures 4.49 cm. Pulmonic Valve: The pulmonic valve was not well visualized. Pulmonic valve regurgitation is not visualized. No evidence of pulmonic stenosis. Aorta: The aortic root is normal in size and structure. Venous: The inferior vena cava is normal in size with greater than 50% respiratory variability, suggesting right atrial pressure of 3 mmHg. IAS/Shunts: The atrial septum is grossly normal. Additional Comments: 3D was performed not requiring image post processing on an independent workstation and was indeterminate.  LEFT VENTRICLE PLAX 2D LVOT diam:     2.80 cm     Diastology LV SV:         113         LV e' lateral:   7.40 cm/s LV SV Index:   47          LV E/e' lateral: 8.9 LVOT Area:     6.16 cm  LV Volumes (MOD) LV vol d, MOD A2C: 97.5 ml LV vol d, MOD A4C: 95.1 ml LV vol s, MOD A2C: 22.4 ml LV vol s, MOD A4C: 19.1 ml LV SV MOD A2C:     75.1 ml LV SV MOD A4C:     95.1 ml LV SV MOD BP:      76.0 ml LEFT ATRIUM             Index LA Vol (A2C):   52.5 ml 22.10 ml/m LA Vol (A4C):   70.3 ml 29.59 ml/m LA Biplane Vol: 62.7 ml 26.39 ml/m  AORTIC VALVE AV Area (Vmax):    5.57 cm AV Area (Vmean):   5.49 cm AV Area (VTI):     4.49 cm AV Vmax:           116.00 cm/s AV Vmean:          65.200 cm/s AV VTI:            0.251 m AV Peak Grad:      5.4 mmHg AV Mean Grad:      2.0 mmHg LVOT Vmax:         105.00 cm/s LVOT Vmean:        58.100 cm/s LVOT VTI:          0.183 m LVOT/AV VTI ratio: 0.73 MITRAL VALVE MV Area (PHT): 5.58 cm    SHUNTS MV Decel Time: 136 msec    Systemic VTI:  0.18 m MV E velocity: 65.80 cm/s  Systemic Diam: 2.80 cm MV A velocity: 73.50 cm/s MV E/A ratio:  0.90 Gloriann Larger MD Electronically signed by Gloriann Larger MD Signature Date/Time: 02/03/2024/12:42:41 PM    Final    DG Chest Port 1 View Result Date: 02/03/2024 CLINICAL DATA:  68 year old  female intubated, found unresponsive. EXAM: PORTABLE CHEST 1 VIEW COMPARISON:  Portable chest yesterday and earlier. FINDINGS: Portable AP semi upright view at 0533 hours. Stable and satisfactory visible endotracheal and enteric tubes. Ongoing low lung volumes. Veiling opacity in the right lower lung is similar but more apparent. Pulmonary vascularity also appears increased, but without overt edema. No pneumothorax. Left lung appears to remain clear. Stable cardiac size and mediastinal contours. Paucity of bowel gas. Faintly visible gastric band. Stable visualized osseous structures. IMPRESSION: 1.  Stable lines and tubes. 2. Veiling right lung base opacity  suggesting smaller moderate right pleural effusion. 3. Symmetric increased pulmonary vascularity without overt edema. Electronically Signed   By: Marlise Simpers M.D.   On: 02/03/2024 06:42   CT Head Wo Contrast Result Date: 02/02/2024 CLINICAL DATA:  Found unresponsive EXAM: CT HEAD WITHOUT CONTRAST TECHNIQUE: Contiguous axial images were obtained from the base of the skull through the vertex without intravenous contrast. RADIATION DOSE REDUCTION: This exam was performed according to the departmental dose-optimization program which includes automated exposure control, adjustment of the mA and/or kV according to patient size and/or use of iterative reconstruction technique. COMPARISON:  05/30/2021 FINDINGS: Brain: There is a geographic area of decreased attenuation identified in the central aspect of the right cerebellar hemisphere suspicious for acute infarct. No acute hemorrhage is identified. No mass lesion is seen. Vascular: No hyperdense vessel or unexpected calcification. Skull: Normal. Negative for fracture or focal lesion. Sinuses/Orbits: Mucosal thickening is noted throughout the paranasal sinuses. Orbits and their contents are within normal limits. Other: None IMPRESSION: Geographic area of decreased attenuation in the central aspect of the right cerebellar  hemisphere suspicious for acute ischemia. Electronically Signed   By: Violeta Grey M.D.   On: 02/02/2024 19:01   DG Chest Portable 1 View Result Date: 02/02/2024 CLINICAL DATA:  post intubation EXAM: PORTABLE CHEST 1 VIEW COMPARISON:  02/27/2023. FINDINGS: Endotracheal tube tip is located approximately 3.5 cm above the carina. Enteric tube courses below the left diaphragm, beyond the field of view. Low lung volumes with accentuation of the cardiomediastinal silhouette. Patient is rotated to the right. Bilateral perihilar interstitial prominence could reflect bronchovascular crowding secondary to hypoinflation or pulmonary vascular congestion. No sizable pleural effusion or pneumothorax. No acute osseous abnormality. IMPRESSION: 1. Endotracheal tube tip is located approximately 3.5 cm above the carina. 2. Low lung volumes. Bilateral perihilar interstitial prominence could reflect bronchovascular crowding secondary to hypoinflation or pulmonary vascular congestion. Electronically Signed   By: Mannie Seek M.D.   On: 02/02/2024 18:32   MM 3D DIAGNOSTIC MAMMOGRAM UNILATERAL RIGHT BREAST Result Date: 01/14/2024 CLINICAL DATA:  Patient presents with lateral right breast pain. No associated mass. EXAM: DIGITAL DIAGNOSTIC UNILATERAL RIGHT MAMMOGRAM WITH TOMOSYNTHESIS AND CAD; ULTRASOUND RIGHT BREAST LIMITED TECHNIQUE: Right digital diagnostic mammography and breast tomosynthesis was performed. The images were evaluated with computer-aided detection. ; Targeted ultrasound examination of the right breast was performed COMPARISON:  Previous exam(s). ACR Breast Density Category a: The breasts are almost entirely fatty. FINDINGS: There are no breast masses, areas of significant asymmetry, areas of architectural distortion or suspicious calcifications. No mammographic change. Targeted ultrasound is performed, showing normal tissue throughout the lateral right breast. No mass or suspicious finding. IMPRESSION: 1. No  evidence of breast malignancy. RECOMMENDATION: Annual screening mammography, last bilateral exam performed on 10/14/2023. I have discussed the findings and recommendations with the patient. If applicable, a reminder letter will be sent to the patient regarding the next appointment. BI-RADS CATEGORY  1: Negative. Electronically Signed   By: Amanda Jungling M.D.   On: 01/14/2024 13:49   US  LIMITED ULTRASOUND INCLUDING AXILLA RIGHT BREAST Result Date: 01/14/2024 CLINICAL DATA:  Patient presents with lateral right breast pain. No associated mass. EXAM: DIGITAL DIAGNOSTIC UNILATERAL RIGHT MAMMOGRAM WITH TOMOSYNTHESIS AND CAD; ULTRASOUND RIGHT BREAST LIMITED TECHNIQUE: Right digital diagnostic mammography and breast tomosynthesis was performed. The images were evaluated with computer-aided detection. ; Targeted ultrasound examination of the right breast was performed COMPARISON:  Previous exam(s). ACR Breast Density Category a: The breasts are almost entirely fatty. FINDINGS: There  are no breast masses, areas of significant asymmetry, areas of architectural distortion or suspicious calcifications. No mammographic change. Targeted ultrasound is performed, showing normal tissue throughout the lateral right breast. No mass or suspicious finding. IMPRESSION: 1. No evidence of breast malignancy. RECOMMENDATION: Annual screening mammography, last bilateral exam performed on 10/14/2023. I have discussed the findings and recommendations with the patient. If applicable, a reminder letter will be sent to the patient regarding the next appointment. BI-RADS CATEGORY  1: Negative. Electronically Signed   By: Amanda Jungling M.D.   On: 01/14/2024 13:49      Subjective: Patient reports he is feeling well, wants to go home, she was ambulated in the hallway  Discharge Exam: Vitals:   02/08/24 0418 02/08/24 0801  BP: (!) 152/74 (!) 146/92  Pulse: 66 61  Resp: 12 20  Temp: 98.2 F (36.8 C) 98.2 F (36.8 C)  SpO2: 90% 93%    Vitals:   02/07/24 2324 02/08/24 0418 02/08/24 0440 02/08/24 0801  BP: (!) 184/86 (!) 152/74  (!) 146/92  Pulse: 69 66  61  Resp: 13 12  20   Temp: 98.2 F (36.8 C) 98.2 F (36.8 C)  98.2 F (36.8 C)  TempSrc: Oral Oral  Oral  SpO2: 94% 90%  93%  Weight:   134.4 kg   Height:        General: Pt is alert, awake, not in acute distress Cardiovascular: RRR, S1/S2 +, no rubs, no gallops Respiratory: CTA bilaterally, no wheezing, no rhonchi Abdominal: Soft, NT, ND, bowel sounds + Extremities: no edema, no cyanosis    The results of significant diagnostics from this hospitalization (including imaging, microbiology, ancillary and laboratory) are listed below for reference.     Microbiology: Recent Results (from the past 240 hours)  Culture, blood (Routine x 2)     Status: None   Collection Time: 02/02/24  4:55 PM   Specimen: BLOOD  Result Value Ref Range Status   Specimen Description BLOOD SITE NOT SPECIFIED  Final   Special Requests   Final    BOTTLES DRAWN AEROBIC AND ANAEROBIC Blood Culture adequate volume   Culture   Final    NO GROWTH 5 DAYS Performed at [facility], [address] [place], [place] [address]    Report Status 02/07/2024 FINAL  Final  Culture, blood (Routine x 2)     Status: None   Collection Time: 02/02/24  5:10 PM   Specimen: BLOOD  Result Value Ref Range Status   Specimen Description BLOOD SITE NOT SPECIFIED  Final   Special Requests   Final    BOTTLES DRAWN AEROBIC AND ANAEROBIC Blood Culture adequate volume   Culture   Final    NO GROWTH 5 DAYS Performed at [facility], [address] [place], [place] [address]    Report Status 02/07/2024 FINAL  Final  MRSA Next Gen by PCR, Nasal     Status: Abnormal   Collection Time: 02/02/24  9:10 PM   Specimen: Nasal Mucosa; Nasal Swab  Result Value Ref Range Status   MRSA by PCR Next Gen DETECTED (A) NOT DETECTED Final    Comment: RESULTS CALLED TO, READ BACK BY AND VERIFIED WITH RN  M.DUNN ON 02/03/24 AT 0208 BY NM (NOTE) The GeneXpert MRSA Assay (FDA approved for NASAL specimens only), is one component of a comprehensive MRSA colonization surveillance program. It is not intended to diagnose MRSA infection nor to guide or monitor treatment for MRSA infections. Test performance  is not FDA approved in patients less than 39 years old. Performed at [facility], [address], [place], [place] [address]   Resp panel by RT-PCR (RSV, Flu A&B, Covid) Anterior Nasal Swab     Status: None   Collection Time: 02/02/24  9:31 PM   Specimen: Anterior Nasal Swab  Result Value Ref Range Status   SARS Coronavirus 2 by RT PCR NEGATIVE NEGATIVE Final   Influenza A by PCR NEGATIVE NEGATIVE Final   Influenza B by PCR NEGATIVE NEGATIVE Final    Comment: (NOTE) The Xpert Xpress SARS-CoV-2/FLU/RSV plus assay is intended as an aid in the diagnosis of influenza from Nasopharyngeal swab specimens and should not be used as a sole basis for treatment. Nasal washings and aspirates are unacceptable for Xpert Xpress SARS-CoV-2/FLU/RSV testing.  Fact Sheet for Patients: BloggerCourse.com  Fact Sheet for Healthcare Providers: SeriousBroker.it  This test is not yet approved or cleared by the [place]  FDA and has been authorized for detection and/or diagnosis of SARS-CoV-2 by FDA under an Emergency Use Authorization (EUA). This EUA will remain in effect (meaning this test can be used) for the duration of the COVID-19 declaration under Section 564(b)(1) of the Act, 21 U.S.C. section 360bbb-3(b)(1), unless the authorization is terminated or revoked.     Resp Syncytial Virus by PCR NEGATIVE NEGATIVE Final    Comment: (NOTE) Fact Sheet for Patients: BloggerCourse.com  Fact Sheet for Healthcare Providers: SeriousBroker.it  This test is not yet approved or cleared by the [place] FDA and has been authorized for detection and/or diagnosis of SARS-CoV-2 by FDA under an Emergency Use Authorization (EUA). This EUA will remain in effect (meaning this test can be used) for the duration of the COVID-19 declaration under Section 564(b)(1) of the Act, 21 U.S.C. section 360bbb-3(b)(1), unless the authorization is terminated or revoked.  Performed at [facility], [address], [place], [place] [address]      Labs: BNP (last 3 results) Recent Labs    02/27/23 1036 02/02/24 1700 02/03/24 2306  BNP 41.5 15.4 31.3   Basic Metabolic Panel: Recent Labs  Lab 02/03/24 0951 02/03/24 1658 02/04/24 0513 02/04/24 1606 02/05/24 0454 02/07/24 0709 02/08/24 0741  NA 140  --  136  --  138 135 135  K 4.0  --  3.6  --  3.9 4.0 4.0  CL 101  --  100  --  101 94* 91*  CO2 23  --  24  --  26 30 31   GLUCOSE 249*  --  365*  --  275* 180* 143*  BUN 17  --  24*  --  27* 24* 18  CREATININE 1.32*  --  1.27*  --  1.04* 0.88 0.87  CALCIUM  9.9  --  8.9  --  9.2 9.8 10.1  MG 1.7   < > 1.7 1.9 1.7 1.8 1.7  PHOS 3.5   < > 4.7* 4.6 3.4 2.9 3.8   < > = values in this interval not displayed.   Liver Function Tests: Recent Labs  Lab 02/02/24 1700  AST 30  ALT 27  ALKPHOS 75  BILITOT 1.3*  PROT 7.5  ALBUMIN 3.9   No results for input(s): LIPASE, AMYLASE in the last 168 hours. Recent Labs  Lab 02/02/24 1712  AMMONIA 44*   CBC: Recent Labs  Lab 02/02/24 1700 02/02/24 1751 02/02/24 2332 02/03/24 0951 02/04/24 0513 02/07/24 0709 02/08/24 0741  WBC 13.0*  --   --  12.3* 14.5* 7.7 7.9  NEUTROABS 10.1*  --   --   --  9.7*  --   --   HGB 15.8*   < > 15.0 15.8* 14.5 13.2 14.5  HCT 51.4*   < > 44.0 51.3* 46.0 42.6 45.9  MCV 79.1*  --   --  78.2* 77.2* 77.3* 77.1*  PLT 167  --   --  162 223 159 179   < > = values in this interval not displayed.   Cardiac Enzymes: No results for input(s): CKTOTAL, CKMB, CKMBINDEX, TROPONINI in the last 168  hours. BNP: Invalid input(s): POCBNP CBG: Recent Labs  Lab 02/07/24 0927 02/07/24 1219 02/07/24 1608 02/07/24 2109 02/08/24 0805  GLUCAP 276* 83 199* 243* 163*   D-Dimer No results for input(s): DDIMER in the last 72 hours. Hgb A1c No results for input(s): HGBA1C in the last 72 hours. Lipid Profile No results for input(s): CHOL, HDL, LDLCALC, TRIG, CHOLHDL, LDLDIRECT in the last 72 hours. Thyroid  function studies No results for input(s): TSH, T4TOTAL, T3FREE, THYROIDAB in the last 72 hours.  Invalid input(s): FREET3 Anemia work up No results for input(s): VITAMINB12, FOLATE, FERRITIN, TIBC, IRON, RETICCTPCT in the last 72 hours. Urinalysis    Component Value Date/Time   COLORURINE YELLOW 02/02/2024 2012   APPEARANCEUR CLOUDY (A) 02/02/2024 2012   LABSPEC 1.012 02/02/2024 2012   PHURINE 5.0 02/02/2024 2012   GLUCOSEU NEGATIVE 02/02/2024 2012   GLUCOSEU NEGATIVE 06/01/2018 1540   HGBUR NEGATIVE 02/02/2024 2012   BILIRUBINUR NEGATIVE 02/02/2024 2012   BILIRUBINUR 1 03/27/2022 1350   KETONESUR 5 (A) 02/02/2024 2012   PROTEINUR 30 (A) 02/02/2024 2012   UROBILINOGEN 1.0 03/27/2022 1350   UROBILINOGEN 1.0 06/01/2018 1540   NITRITE POSITIVE (A) 02/02/2024 2012   LEUKOCYTESUR TRACE (A) 02/02/2024 2012   Sepsis Labs Recent Labs  Lab 02/03/24 0951 02/04/24 0513 02/07/24 0709 02/08/24 0741  WBC 12.3* 14.5* 7.7 7.9   Microbiology Recent Results (from the past 240 hours)  Culture, blood (Routine x 2)     Status: None   Collection Time: 02/02/24  4:55 PM   Specimen: BLOOD  Result Value Ref Range Status   Specimen Description BLOOD SITE NOT SPECIFIED  Final   Special Requests   Final    BOTTLES DRAWN AEROBIC AND ANAEROBIC Blood Culture adequate volume   Culture   Final    NO GROWTH 5 DAYS Performed at [facility], [address], [place], [place] [address]    Report Status 02/07/2024 FINAL  Final  Culture, blood  (Routine x 2)     Status: None   Collection Time: 02/02/24  5:10 PM   Specimen: BLOOD  Result Value Ref Range Status   Specimen Description BLOOD SITE NOT SPECIFIED  Final   Special Requests   Final    BOTTLES DRAWN AEROBIC AND ANAEROBIC Blood Culture adequate volume   Culture   Final    NO GROWTH 5 DAYS Performed at [facility], [address], [place], [place] [address]    Report Status 02/07/2024 FINAL  Final  MRSA Next Gen by PCR, Nasal     Status: Abnormal   Collection Time: 02/02/24  9:10 PM   Specimen: Nasal Mucosa; Nasal Swab  Result Value Ref Range Status   MRSA by PCR Next Gen DETECTED (A) NOT DETECTED Final    Comment: RESULTS CALLED TO, READ BACK BY AND VERIFIED WITH RN M.DUNN ON 02/03/24 AT 0208 BY NM (NOTE) The  GeneXpert MRSA Assay (FDA approved for NASAL specimens only), is one component of a comprehensive MRSA colonization surveillance program. It is not intended to diagnose MRSA infection nor to guide or monitor treatment for MRSA infections. Test performance is not FDA approved in patients less than 20 years old. Performed at [facility], [address] [place], [place] [address]   Resp panel by RT-PCR (RSV, Flu A&B, Covid) Anterior Nasal Swab     Status: None   Collection Time: 02/02/24  9:31 PM   Specimen: Anterior Nasal Swab  Result Value Ref Range Status   SARS Coronavirus 2 by RT PCR NEGATIVE NEGATIVE Final   Influenza A by PCR NEGATIVE NEGATIVE Final   Influenza B by PCR NEGATIVE NEGATIVE Final    Comment: (NOTE) The Xpert Xpress SARS-CoV-2/FLU/RSV plus assay is intended as an aid in the diagnosis of influenza from Nasopharyngeal swab specimens and should not be used as a sole basis for treatment. Nasal washings and aspirates are unacceptable for Xpert Xpress SARS-CoV-2/FLU/RSV testing.  Fact Sheet for Patients: BloggerCourse.com  Fact Sheet for Healthcare  Providers: SeriousBroker.it  This test is not yet approved or cleared by the [place]  FDA and has been authorized for detection and/or diagnosis of SARS-CoV-2 by FDA under an Emergency Use Authorization (EUA). This EUA will remain in effect (meaning this test can be used) for the duration of the COVID-19 declaration under Section 564(b)(1) of the Act, 21 U.S.C. section 360bbb-3(b)(1), unless the authorization is terminated or revoked.     Resp Syncytial Virus by PCR NEGATIVE NEGATIVE Final    Comment: (NOTE) Fact Sheet for Patients: BloggerCourse.com  Fact Sheet for Healthcare Providers: SeriousBroker.it  This test is not yet approved or cleared by the [place]  FDA and has been authorized for detection and/or diagnosis of SARS-CoV-2 by FDA under an Emergency Use Authorization (EUA). This EUA will remain in effect (meaning this test can be used) for the duration of the COVID-19 declaration under Section 564(b)(1) of the Act, 21 U.S.C. section 360bbb-3(b)(1), unless the authorization is terminated or revoked.  Performed at [facility], [address], [place], [place] [address]      Time coordinating discharge: Over 30 minutes  SIGNED:   Seena Dadds, MD  [facility] 02/08/2024, 10:48 AM Pager   If 7PM-7AM, please contact night-coverage [email or web address] Password [email or web address]

## 2024-02-08 NOTE — TOC Transition Note (Signed)
 Transition of Care [facility]) - Discharge Note   Patient Details  Name: Mary Acosta MRN: [id number] Date of Birth: 1956/08/01  [facility] CM/SW Contact:  Omie Bickers, RN Phone Number: 02/08/2024, 10:32 AM   Clinical Narrative:     Notified [facility] that patient will DC to home today.  Discussed HH and oxygen  with MD, patient has nocturnal oxygen  at home, no need for additional liter flow. No additional TOC needs identified.    Final next level of care: Home w Home Health Services Barriers to Discharge: No Barriers Identified   Patient Goals and CMS Choice Patient states their goals for this hospitalization and ongoing recovery are:: to go home CMS Medicare.gov Compare Post Acute Care list provided to:: Patient Choice offered to / list presented to : Patient      Discharge Placement                       Discharge Plan and Services Additional resources added to the After Visit Summary for     Discharge Planning Services: CM Consult                      HH Arranged: PT, OT HH Agency: [facility] Date [place] Agency Contacted: 02/08/24 Time HH Agency Contacted: 1032 Representative spoke with at [facility] Agency: Baruch Likens  Social Drivers of Health (SDOH) Interventions SDOH Screenings   Food Insecurity: No Food Insecurity (12/23/2023)  Housing: Unknown (12/23/2023)  Transportation Needs: No Transportation Needs (12/23/2023)  Utilities: Not At Risk (12/23/2023)  Alcohol Screen: Low Risk  (12/23/2023)  Depression (PHQ2-9): Medium Risk (12/23/2023)  Financial Resource Strain: Low Risk  (12/23/2023)  Physical Activity: Inactive (12/23/2023)  Social Connections: Moderately Isolated (12/23/2023)  Stress: Stress Concern Present (12/23/2023)  Tobacco Use: Medium Risk (02/02/2024)  Health Literacy: Adequate Health Literacy (12/23/2023)     Readmission Risk Interventions     No data to display

## 2024-02-08 NOTE — Progress Notes (Signed)
 DISCHARGE NOTE HOME Mary Acosta to be discharged Home per MD order. Discussed prescriptions and follow up appointments with the patient. Prescriptions given to patient; medication list explained in detail. Patient verbalized understanding.  Skin clean, dry and intact without evidence of skin break down, no evidence of skin tears noted. IV catheter discontinued intact. Site without signs and symptoms of complications. Dressing and pressure applied. Pt denies pain at the site currently. No complaints noted.  Patient free of lines, drains, and wounds.  Dermatitis groin area at discharge  An After Visit Summary (AVS) was printed and given to the patient. Patient escorted via wheelchair, and discharged home via private auto.  Tonda Francisco, RN

## 2024-02-08 NOTE — Plan of Care (Signed)

## 2024-02-08 NOTE — Discharge Instructions (Signed)
 Follow with Primary MD Colene Dauphin, MD in 7 days   Get CBC, CMP, 2 view Chest X ray checked  by Primary MD next visit.    Activity: As tolerated with Full fall precautions use walker/cane & assistance as needed   Disposition Home   Diet: Heart Healthy /carb modified , with feeding assistance and aspiration precautions.  For Heart failure patients - Check your Weight same time everyday, if you gain over 2 pounds, or you develop in leg swelling, experience more shortness of breath or chest pain, call your Primary MD immediately. Follow Cardiac Low Salt Diet and 1.5 lit/day fluid restriction.   On your next visit with your primary care physician please Get Medicines reviewed and adjusted.   Please request your Prim.MD to go over all Hospital Tests and Procedure/Radiological results at the follow up, please get all Hospital records sent to your Prim MD by signing hospital release before you go home.   If you experience worsening of your admission symptoms, develop shortness of breath, life threatening emergency, suicidal or homicidal thoughts you must seek medical attention immediately by calling 911 or calling your MD immediately  if symptoms less severe.  You Must read complete instructions/literature along with all the possible adverse reactions/side effects for all the Medicines you take and that have been prescribed to you. Take any new Medicines after you have completely understood and accpet all the possible adverse reactions/side effects.   Do not drive, operating heavy machinery, perform activities at heights, swimming or participation in water  activities or provide baby sitting services if your were admitted for syncope or siezures until you have seen by Primary MD or a Neurologist and advised to do so again.  Do not drive when taking Pain medications.    Do not take more than prescribed Pain, Sleep and Anxiety Medications  Special Instructions: If you have smoked or chewed  Tobacco  in the last 2 yrs please stop smoking, stop any regular Alcohol  and or any Recreational drug use.  Wear Seat belts while driving.   Please note  You were cared for by a hospitalist during your hospital stay. If you have any questions about your discharge medications or the care you received while you were in the hospital after you are discharged, you can call the unit and asked to speak with the hospitalist on call if the hospitalist that took care of you is not available. Once you are discharged, your primary care physician will handle any further medical issues. Please note that NO REFILLS for any discharge medications will be authorized once you are discharged, as it is imperative that you return to your primary care physician (or establish a relationship with a primary care physician if you do not have one) for your aftercare needs so that they can reassess your need for medications and monitor your lab values.

## 2024-02-09 ENCOUNTER — Encounter: Admitting: Adult Health

## 2024-02-10 ENCOUNTER — Inpatient Hospital Stay: Admission: RE | Admit: 2024-02-10 | Source: Ambulatory Visit

## 2024-02-10 ENCOUNTER — Telehealth: Payer: Self-pay | Admitting: *Deleted

## 2024-02-10 DIAGNOSIS — M353 Polymyalgia rheumatica: Secondary | ICD-10-CM | POA: Diagnosis not present

## 2024-02-10 DIAGNOSIS — E1140 Type 2 diabetes mellitus with diabetic neuropathy, unspecified: Secondary | ICD-10-CM | POA: Diagnosis not present

## 2024-02-10 DIAGNOSIS — Z85850 Personal history of malignant neoplasm of thyroid: Secondary | ICD-10-CM | POA: Diagnosis not present

## 2024-02-10 DIAGNOSIS — J9622 Acute and chronic respiratory failure with hypercapnia: Secondary | ICD-10-CM | POA: Diagnosis not present

## 2024-02-10 DIAGNOSIS — Z7984 Long term (current) use of oral hypoglycemic drugs: Secondary | ICD-10-CM | POA: Diagnosis not present

## 2024-02-10 DIAGNOSIS — E039 Hypothyroidism, unspecified: Secondary | ICD-10-CM | POA: Diagnosis not present

## 2024-02-10 DIAGNOSIS — F419 Anxiety disorder, unspecified: Secondary | ICD-10-CM | POA: Diagnosis not present

## 2024-02-10 DIAGNOSIS — J440 Chronic obstructive pulmonary disease with acute lower respiratory infection: Secondary | ICD-10-CM | POA: Diagnosis not present

## 2024-02-10 DIAGNOSIS — K7460 Unspecified cirrhosis of liver: Secondary | ICD-10-CM | POA: Diagnosis not present

## 2024-02-10 DIAGNOSIS — Z9981 Dependence on supplemental oxygen: Secondary | ICD-10-CM | POA: Diagnosis not present

## 2024-02-10 DIAGNOSIS — J189 Pneumonia, unspecified organism: Secondary | ICD-10-CM | POA: Diagnosis not present

## 2024-02-10 DIAGNOSIS — I10 Essential (primary) hypertension: Secondary | ICD-10-CM | POA: Diagnosis not present

## 2024-02-10 DIAGNOSIS — J449 Chronic obstructive pulmonary disease, unspecified: Secondary | ICD-10-CM | POA: Diagnosis not present

## 2024-02-10 DIAGNOSIS — F32A Depression, unspecified: Secondary | ICD-10-CM | POA: Diagnosis not present

## 2024-02-10 DIAGNOSIS — Z794 Long term (current) use of insulin: Secondary | ICD-10-CM | POA: Diagnosis not present

## 2024-02-10 DIAGNOSIS — J9621 Acute and chronic respiratory failure with hypoxia: Secondary | ICD-10-CM | POA: Diagnosis not present

## 2024-02-10 DIAGNOSIS — Z7985 Long-term (current) use of injectable non-insulin antidiabetic drugs: Secondary | ICD-10-CM | POA: Diagnosis not present

## 2024-02-10 DIAGNOSIS — G8929 Other chronic pain: Secondary | ICD-10-CM | POA: Diagnosis not present

## 2024-02-10 DIAGNOSIS — M797 Fibromyalgia: Secondary | ICD-10-CM | POA: Diagnosis not present

## 2024-02-10 DIAGNOSIS — E785 Hyperlipidemia, unspecified: Secondary | ICD-10-CM | POA: Diagnosis not present

## 2024-02-10 DIAGNOSIS — M545 Low back pain, unspecified: Secondary | ICD-10-CM | POA: Diagnosis not present

## 2024-02-10 NOTE — Transitions of Care (Post Inpatient/ED Visit) (Signed)
 02/10/2024  Name: Mary Acosta MRN: [id number] DOB: [birth year]  Today's TOC FU Call Status: Today's TOC FU Call Status:: Successful TOC FU Call Completed TOC FU Call Complete Date: 02/10/24 Patient's Name and Date of Birth confirmed.  Transition Care Management Follow-up Telephone Call Date of Discharge: 02/08/24 Discharge Facility: [facility]) Type of Discharge: Inpatient Admission Primary Inpatient Discharge Diagnosis:: Acute Respiratory Failure requiring mechanical ventilation/ intubation; (R) LL pneumonia; hypoxia- hypercarbia How have you been since you were released from the hospital?: Better (I feel great since i have been home- back to my normal self, doing fine, not having any problems at all.  Thanks for getting me scheduled with Dr. Donnette Gal.  The home health people are here now, I will call you back if I need anything) Any questions or concerns?: No  Items Reviewed: Did you receive and understand the discharge instructions provided?: Yes (reviewed with patient who verbalizes good understanding of same) Medications obtained,verified, and reconciled?: Yes (Medications Reviewed) (Full medication reconciliation/ review completed; no concerns or discrepancies identified; confirmed patient obtained/ is taking all newly Rx'd medications as instructed; self-manages medications and denies questions/ concerns around medications today) Any new allergies since your discharge?: No Dietary orders reviewed?: Yes Type of Diet Ordered:: Low carb and low sugar Do you have support at home?: Yes People in Home [RPT]: spouse Name of Support/Comfort Primary Source: Reports independent in self-care activities; resides with supportive spouse and other extended family members- assists as/ if needed/ indicated  Medications Reviewed Today: Medications Reviewed Today     Reviewed by Taneesha Edgin M, RN (Registered Nurse) on 02/10/24 at 1212  Med List Status: <None>   Medication Order  Taking? Sig Documenting Provider Last Dose Status Informant  albuterol  (VENTOLIN  HFA) 108 (90 Base) MCG/ACT inhaler [id number] Yes INHALE TWO (2) PUFFS BY MOUTH EVERY 6 HOURS AS NEEDED FOR SHORTNESS OF BREATH AND WHEEZING Burns, Beckey Bourgeois, MD  Active Self, Pharmacy Records           Med Note (CRUTHIS, CHLOE C   Thu Feb 05, 2024  8:55 AM)    amLODipine  (NORVASC ) 10 MG tablet [id number] Yes Take 1 tablet (10 mg total) by mouth daily. Elgergawy, Ardia Kraft, MD  Active   baclofen (LIORESAL) 10 MG tablet [id number] Yes Take 10 mg by mouth 2 (two) times daily. 02/10/24: Reports during TOC call takes prn [provider]  Active Self, Pharmacy Records  carvedilol (COREG) 25 MG tablet [id number] Yes Take 1 tablet (25 mg total) by mouth 2 (two) times daily with a meal. Elgergawy, Ardia Kraft, MD  Active   colchicine  0.6 MG tablet [id number] Yes Take 1 tablet (0.6 mg total) by mouth daily. Colene Dauphin, MD  Active Self, Pharmacy Records           Med Note Merlyn Starring, Gearldean Keepers   Fri Feb 06, 2024  9:35 AM)    Continuous Blood Gluc Sensor (FREESTYLE LIBRE 3 SENSOR) [place] [id number]  1 each by Does not apply route every 14 (fourteen) days.  Patient not taking: Reported on 02/10/2024   Emilie Harden, MD  Active Self, Pharmacy Records           Med Note Bobbi Burow, Arturo Late Feb 04, 2024  7:19 AM) LF 03/25 FOR 90 DS   Continuous Glucose Sensor (DEXCOM G7 SENSOR) MISC [id number] Yes 3 each by Does not apply route every 30 (thirty) days. Apply 1 sensor every 10 days Emilie Harden, MD  Active  Self, Pharmacy Records  ergocalciferol  (VITAMIN D2) 1.25 MG (50000 UT) capsule [id number] Yes Take 1 capsule (50,000 Units total) by mouth once a week. Monday Colene Dauphin, MD  Active Self, Pharmacy Records           Med Note Merlyn Starring, Gearldean Keepers   Fri Feb 06, 2024  9:35 AM)    fexofenadine (ALLEGRA) 180 MG tablet [id number] Yes Take 180 mg by mouth daily as needed for allergies or rhinitis. [provider]  Active Self,  Pharmacy Records           Med Note Guido Leeks, Danney Dutton Feb 02, 2024  6:22 PM)    folic acid (FOLVITE) 1 MG tablet [id number]  Take 1 mg by mouth daily.  Patient not taking: Reported on 02/10/2024   [provider]  Active Self, Pharmacy Records           Med Note Bobbi Burow, Dayla Eva   Wed Feb 04, 2024  7:19 AM) LF 08/24 FOR 30 DS   furosemide  (LASIX ) 40 MG tablet [id number] Yes TAKE 1 TABLET BY MOUTH EACH MORNING Burns, Beckey Bourgeois, MD  Active Self, Pharmacy Records           Med Note (CRUTHIS, CHLOE C   Wed Feb 04, 2024  7:20 AM) LF 05/25 FOR 30 DS   gabapentin  (NEURONTIN ) 300 MG capsule [id number] Yes Take 300 mg by mouth 2 (two) times daily. [provider]  Active Self, Pharmacy Records           Med Note (CRUTHIS, CHLOE C   Wed Feb 04, 2024  7:20 AM) LF 05/25 FOR 30 DS   hydrochlorothiazide  (HYDRODIURIL ) 25 MG tablet [id number] Yes Take 1 tablet (25 mg total) by mouth daily. Elgergawy, Ardia Kraft, MD  Active   insulin  glargine, 1 Unit Dial, (TOUJEO  SOLOSTAR) 300 UNIT/ML Solostar Pen [id number] Yes Inject 25 Units into the skin 2 (two) times daily. Elgergawy, Ardia Kraft, MD  Active   Insulin  Lispro-aabc (LYUMJEV  KWIKPEN) 200 UNIT/ML KwikPen [id number] Yes Inject 10 Units into the skin 3 (three) times daily. 02/10/24: Reports during [facility] call endocrinology provider prescribed this with sliding scale to take with meals [provider]  Active   Insulin  Pen Needle (BD PEN NEEDLE NANO U/F) 32G X 4 MM MISC [id number] Yes USE AS DIRECTED FIVE TIMES A Gillian Lacrosse, MD  Active Self, Pharmacy Records  Insulin  Syringe-Needle U-100 26G X 1/2 1 ML MISC [id number] Yes Use daily for insulin  injection as directed Colene Dauphin, MD  Active Self, Pharmacy Records  Ipratropium-Albuterol  (COMBIVENT  RESPIMAT) 20-100 MCG/ACT AERS respimat [id number] Yes Inhale 1 puff into the lungs every 6 (six) hours as needed for wheezing. Colene Dauphin, MD  Active Self, Pharmacy Records           Med Note  Val Garin, Dionicio Fray Feb 04, 2024  8:11 AM) Entry is from 2023. No prescription record in past 12 months.  ipratropium-albuterol  (DUONEB) 0.5-2.5 (3) MG/3ML SOLN [id number] Yes Take 3 mLs by nebulization every 6 (six) hours as needed (Wheezing or dyspnea.). Maire Scot, MD  Active Self, Pharmacy Records           Med Note Val Garin, Dionicio Fray Feb 04, 2024  8:11 AM) Entry is from 2021. No prescription record in past 12 months.  levothyroxine  (SYNTHROID ) 175 MCG tablet [id number] Yes TAKE 1 TABLET BY MOUTH ONCE DAILY BEFORE Basil Lim, MD  Active  Self, Pharmacy Records           Med Note (CRUTHIS, CHLOE C   Wed Feb 04, 2024  7:21 AM) LF 05/25 FOR 30 DS   lidocaine  (LIDODERM ) 5 % [id number] Yes Place 1 patch onto the skin daily. Patient uses these patches on her knees. [provider]  Active Self, Pharmacy Records           Med Note (CRUTHIS, CHLOE C   Wed Feb 04, 2024  7:21 AM) LF 05/25 FOR 30 DS   metFORMIN  (GLUCOPHAGE ) 500 MG tablet [id number] Yes TAKE 1 TABLET BY MOUTH EACH MORNING AND TAKE 1 TABLET EVERY DAY AT BEDTIME Emilie Harden, MD  Active Self, Pharmacy Records           Med Note Merlyn Starring, Gearldean Keepers   Fri Feb 06, 2024  9:38 AM) Patient states that she sometimes takes this twice daily but could not elaborate as to why she sometimes takes it.  naloxone  (NARCAN ) nasal spray 4 mg/0.1 mL [id number] Yes One spray in either nostril once for known/suspected opioid overdose. May repeat every 2-3 minutes in alternating nostril til EMS arrives [provider]  Active Self, Pharmacy Records           Med Note Omie Bickers Feb 04, 2024  8:12 AM) LF 11/24  PARoxetine  (PAXIL ) 20 MG tablet [id number] Yes Take 20 mg by mouth daily. [provider]  Active Self, Pharmacy Records           Med Note Merlyn Starring, Gearldean Keepers   Fri Feb 06, 2024  9:39 AM) Patient states that she takes a 20mg  tablet along with a 40mg  tablet for a total of 60mg  once  daily.  PARoxetine  (PAXIL ) 40 MG tablet [id number] Yes Take 1 tablet (40 mg total) by mouth daily. Colene Dauphin, MD  Active Self, Pharmacy Records           Med Note Merlyn Starring, Gearldean Keepers   Fri Feb 06, 2024  9:40 AM) Patient states that she takes a 40mg  tablet along with a 20mg  tablet for a total of 60mg  once daily.  Propylene Glycol (SYSTANE BALANCE) 0.6 % SOLN [id number] Yes Place 1 drop into both eyes daily as needed (dry eyes). [provider]  Active Self, Pharmacy Records  Semaglutide , 1 MG/DOSE, (OZEMPIC , 1 MG/DOSE,) 4 MG/3ML SOPN [id number] Yes INJECT 1MG  INTO THE SKIN ONCE A Trina Fujita, MD  Active Self, Pharmacy Records           Med Note Bobbi Burow, Arturo Late Feb 04, 2024  7:23 AM) LF 05/25 FOR 84 DS   simvastatin  (ZOCOR ) 20 MG tablet [id number] Yes TAKE 1 TABLET BY MOUTH EVERY DAY AT BEDTIME Lenise Quince, MD  Active Self, Pharmacy Records           Med Note (CRUTHIS, CHLOE C   Wed Feb 04, 2024  7:23 AM) LF 05/25 FOR 30 DS            Home Care and Equipment/Supplies: Were Home Health Services Ordered?: Yes Name of Home Health Agency:: [facility] Has Agency set up a time to come to your home?: Yes First Home Health Visit Date: 02/10/24 (Home health initial visit: arrived at patient's home during [facility] call) Any new equipment or medical supplies ordered?: No  Functional Questionnaire: Do you need assistance with bathing/showering or dressing?: Yes (family assists as needed) Do you need assistance with meal preparation?: Yes (family  assists as needed) Do you need assistance with eating?: No Do you have difficulty maintaining continence: No Do you need assistance with getting out of bed/getting out of a chair/moving?: No Do you have difficulty managing or taking your medications?: No  Follow up appointments reviewed: PCP Follow-up appointment confirmed?: Yes (care coordination outreach in real-time with scheduling care guide to successfully schedule hospital  follow up PCP appointment 02/16/24) Date of PCP follow-up appointment?: 02/16/24 Follow-up Provider: PCP Specialist Hospital Follow-up appointment confirmed?: Yes Date of Specialist follow-up appointment?: 02/17/24 Follow-Up Specialty Provider:: Endocrinology provider Do you need transportation to your follow-up appointment?: No Do you understand care options if your condition(s) worsen?: Yes-patient verbalized understanding  SDOH Interventions Today    Flowsheet Row Most Recent Value  SDOH Interventions   Food Insecurity Interventions Intervention Not Indicated  Housing Interventions Intervention Not Indicated  Transportation Interventions Intervention Not Indicated  Utilities Interventions Intervention Not Indicated   Patient declines need for ongoing/ further care management outreach; declines enrollment in 30-day TOC program; provided my direct contact information should questions/ concerns/ needs arise post-TOC call   See TOC assessment tabs for additional assessment/ TOC intervention information  Pls call/ message for questions,  Aliya Sol Mckinney Dayanne Yiu, RN, BSN, Media planner  Transitions of Care  [facility]   [phone]: direct office

## 2024-02-12 ENCOUNTER — Telehealth: Payer: Self-pay | Admitting: Internal Medicine

## 2024-02-12 NOTE — Telephone Encounter (Signed)
 Copied from CRM [id number]. Topic: Clinical - Home Health Verbal Orders >> Feb 12, 2024  9:15 AM Albertha Alosa wrote: Caller/Agency: [facility] Number: [phone] Service Requested: Skilled Nursing Frequency: 1 week for 5 weeks  Any new concerns about the patient? No

## 2024-02-12 NOTE — Telephone Encounter (Signed)
 Okay for orders?

## 2024-02-13 NOTE — Telephone Encounter (Signed)
Verbals left for today 

## 2024-02-15 ENCOUNTER — Encounter: Payer: Self-pay | Admitting: Internal Medicine

## 2024-02-15 NOTE — Progress Notes (Unsigned)
 Subjective:    Patient ID: Mary Acosta, female    DOB: September 16, 1955, 68 y.o.   MRN: [id number]     HPI [name] is here for follow up from the hospital   Admitted 6/9 -- 6/15  Discharged to home-resume home PT, OT and RN Recheck BMP, CBC 1 week Advised to follow-up with sleep medicine  EMS called to the house because she was having labored breathing and poor responsiveness.  She required bag-valve-mask assisted ventilations.  Narcan  had no effect.  With bag valve mask ventilations she was somewhat responsive, but still poor.  She remained that way in the emergency room and she was intubated.  Chest x-ray showed probable vascular congestion.  Lab evaluation showed hypercapnia.  She was admitted to ICU.  Transferred to progressive care 6/14.  Acute on chronic hypoxic and hypercarbic respiratory failure, decompensated OHS, COPD, RLL pneumonia, and acute encephalopathy secondary to hypercapnia: History of OSA-intolerant to BiPAP Treated with doxycycline  for pneumonia On supplemental oxygen  which she is on at baseline Pulmonary hygiene Slowly resumed on narcotics, antianxiety medications to decrease potential oversedation  Acute encephalopathy, both toxic and hypercapnic Polypharmacy: Poor historian on multiple medications-not sure which she is taking and not taking Medications held during hospital Resumed on minimal medications to decrease side effects  Hypertension, hyperlipidemia: Not sure of compliance with home meds Blood pressure controlled with amlodipine , Coreg  Continue statin  Hypothyroid: Continued on Synthroid   Type 2 diabetes: Uncontrolled-A1c 11.5 On semaglutide , Tresiba , metformin  Continued with insulin  sliding scale, long-acting insulin   Nonalcoholic fatty liver disease: Avoid hepatotoxins  Chronic low back pain, chronic narcotic/benzo use, PMR, fibromyalgia, anxiety: Home meds include buprenorphine, BuSpar , Celexa, Klonopin , Paxil ,  trazodone Discharged home on Paxil  and gabapentin    She feels better.  She feels she is breathing better.    BP ok at home.  She did not take her meds this morning.    She is taking her pain medication.    She had her first visit with home health-to have PT, OT and RN   Medications and allergies reviewed with patient and updated if appropriate.  Current Outpatient Medications on File Prior to Visit  Medication Sig Dispense Refill   albuterol  (VENTOLIN  HFA) 108 (90 Base) MCG/ACT inhaler INHALE TWO (2) PUFFS BY MOUTH EVERY 6 HOURS AS NEEDED FOR SHORTNESS OF BREATH AND WHEEZING 18 g 10   amLODipine  (NORVASC ) 10 MG tablet Take 1 tablet (10 mg total) by mouth daily. 30 tablet 0   atomoxetine (STRATTERA) 40 MG capsule Take 40 mg by mouth every morning.     baclofen (LIORESAL) 10 MG tablet Take 10 mg by mouth 2 (two) times daily. 02/10/24: Reports during TOC call takes prn     carvedilol  (COREG ) 25 MG tablet Take 1 tablet (25 mg total) by mouth 2 (two) times daily with a meal. 60 tablet 0   colchicine  0.6 MG tablet Take 1 tablet (0.6 mg total) by mouth daily. 30 tablet 5   Continuous Blood Gluc Sensor (FREESTYLE LIBRE 3 SENSOR) MISC 1 each by Does not apply route every 14 (fourteen) days. 6 each 3   Continuous Glucose Sensor (DEXCOM G7 SENSOR) MISC 3 each by Does not apply route every 30 (thirty) days. Apply 1 sensor every 10 days 9 each 4   ergocalciferol  (VITAMIN D2) 1.25 MG (50000 UT) capsule Take 1 capsule (50,000 Units total) by mouth once a week. Monday 12 capsule 3   fexofenadine (ALLEGRA) 180 MG tablet Take 180  mg by mouth daily as needed for allergies or rhinitis.     folic acid (FOLVITE) 1 MG tablet Take 1 mg by mouth daily.     furosemide  (LASIX ) 40 MG tablet TAKE 1 TABLET BY MOUTH EACH MORNING 30 tablet 10   gabapentin  (NEURONTIN ) 300 MG capsule Take 300 mg by mouth 2 (two) times daily.     hydrochlorothiazide  (HYDRODIURIL ) 25 MG tablet Take 1 tablet (25 mg total) by mouth daily.  30 tablet 0   insulin  glargine, 1 Unit Dial, (TOUJEO  SOLOSTAR) 300 UNIT/ML Solostar Pen Inject 25 Units into the skin 2 (two) times daily.     Insulin  Lispro-aabc (LYUMJEV  KWIKPEN) 200 UNIT/ML KwikPen Inject 10 Units into the skin 3 (three) times daily. 02/10/24: Reports during TOC call endocrinology provider prescribed this with sliding scale to take with meals     Insulin  Pen Needle (BD PEN NEEDLE NANO U/F) 32G X 4 MM MISC USE AS DIRECTED FIVE TIMES A DAY 100 each 10   Insulin  Syringe-Needle U-100 26G X 1/2 1 ML MISC Use daily for insulin  injection as directed 100 each 3   Ipratropium-Albuterol  (COMBIVENT  RESPIMAT) 20-100 MCG/ACT AERS respimat Inhale 1 puff into the lungs every 6 (six) hours as needed for wheezing. 4 g 5   ipratropium-albuterol  (DUONEB) 0.5-2.5 (3) MG/3ML SOLN Take 3 mLs by nebulization every 6 (six) hours as needed (Wheezing or dyspnea.). 360 mL 11   levothyroxine  (SYNTHROID ) 175 MCG tablet TAKE 1 TABLET BY MOUTH ONCE DAILY BEFORE BREAKFAST 30 tablet 10   lidocaine  (LIDODERM ) 5 % Place 1 patch onto the skin daily. Patient uses these patches on her knees.     metFORMIN  (GLUCOPHAGE ) 500 MG tablet TAKE 1 TABLET BY MOUTH EACH MORNING AND TAKE 1 TABLET EVERY DAY AT BEDTIME 180 tablet 3   naloxone  (NARCAN ) nasal spray 4 mg/0.1 mL One spray in either nostril once for known/suspected opioid overdose. May repeat every 2-3 minutes in alternating nostril til EMS arrives     PARoxetine  (PAXIL ) 20 MG tablet Take 20 mg by mouth daily.     PARoxetine  (PAXIL ) 40 MG tablet Take 1 tablet (40 mg total) by mouth daily. 90 tablet 1   Propylene Glycol (SYSTANE BALANCE) 0.6 % SOLN Place 1 drop into both eyes daily as needed (dry eyes).     Semaglutide , 1 MG/DOSE, (OZEMPIC , 1 MG/DOSE,) 4 MG/3ML SOPN INJECT 1MG  INTO THE SKIN ONCE A WEEK 9 mL 3   simvastatin  (ZOCOR ) 20 MG tablet TAKE 1 TABLET BY MOUTH EVERY DAY AT BEDTIME 90 tablet 1   TRESIBA  FLEXTOUCH 200 UNIT/ML FlexTouch Pen SMARTSIG:80 Unit(s) SUB-Q  Daily     No current facility-administered medications on file prior to visit.     Review of Systems  Constitutional:  Negative for fever.  Respiratory:  Positive for shortness of breath (with moderate exertoin - improved). Negative for cough and wheezing.   Cardiovascular:  Negative for chest pain, palpitations and leg swelling.  Gastrointestinal:  Negative for nausea (none recently).       No gerd  Neurological:  Negative for light-headedness and headaches.       Objective:   Vitals:   02/16/24 1342  BP: (!) 140/82  Pulse: 70  Temp: 99 F (37.2 C)  SpO2: 90%   BP Readings from Last 3 Encounters:  02/16/24 (!) 140/82  02/08/24 (!) 146/92  01/13/24 136/70   Wt Readings from Last 3 Encounters:  02/16/24 292 lb (132.5 kg)  02/08/24 296 lb 4.8 oz (134.4  kg)  01/13/24 300 lb (136.1 kg)   Body mass index is 45.73 kg/m.    Physical Exam Constitutional:      General: She is not in acute distress.    Appearance: Normal appearance.  HENT:     Head: Normocephalic and atraumatic.   Eyes:     Conjunctiva/sclera: Conjunctivae normal.    Cardiovascular:     Rate and Rhythm: Normal rate and regular rhythm.     Heart sounds: Normal heart sounds.  Pulmonary:     Effort: Pulmonary effort is normal. No respiratory distress.     Breath sounds: Normal breath sounds. No wheezing.   Musculoskeletal:     Cervical back: Neck supple.     Right lower leg: No edema.     Left lower leg: No edema.  Lymphadenopathy:     Cervical: No cervical adenopathy.   Skin:    General: Skin is warm and dry.     Findings: No rash.   Neurological:     Mental Status: She is alert. Mental status is at baseline.   Psychiatric:        Mood and Affect: Mood normal.        Behavior: Behavior normal.        Lab Results  Component Value Date   WBC 7.9 02/08/2024   HGB 14.5 02/08/2024   HCT 45.9 02/08/2024   PLT 179 02/08/2024   GLUCOSE 143 (H) 02/08/2024   CHOL 163 05/27/2023   TRIG  178 (H) 02/03/2024   HDL 32.30 (L) 05/27/2023   LDLCALC 88 05/27/2023   ALT 27 02/02/2024   AST 30 02/02/2024   NA 135 02/08/2024   K 4.0 02/08/2024   CL 91 (L) 02/08/2024   CREATININE 0.87 02/08/2024   BUN 18 02/08/2024   CO2 31 02/08/2024   TSH 0.111 (L) 02/03/2024   INR 1.0 02/02/2024   HGBA1C 11.5 (A) 11/19/2023   MICROALBUR <0.7 05/27/2023   DG Chest 2 View CLINICAL DATA:  Follow-up pleural effusions.  EXAM: CHEST - 2 VIEW  COMPARISON:  02/03/2024.  CT, 03/30/2018.  FINDINGS: Cardiac silhouette is normal in size. Normal mediastinal and hilar contours.  Clear lungs.  No pleural effusion.  No pneumothorax.  Skeletal structures are intact.  IMPRESSION: 1. No active cardiopulmonary disease. 2. Resolved right pleural effusion.  Electronically Signed   By: Alm Parkins M.D.   On: 02/16/2024 14:46    Assessment & Plan:    See Problem List for Assessment and Plan of chronic medical problems.

## 2024-02-15 NOTE — Patient Instructions (Addendum)
      Blood work was ordered.   Chest xray ordered     Medications changes include :   None      Return for follow up as scheduled.

## 2024-02-16 ENCOUNTER — Ambulatory Visit (INDEPENDENT_AMBULATORY_CARE_PROVIDER_SITE_OTHER)

## 2024-02-16 ENCOUNTER — Ambulatory Visit: Payer: Self-pay | Admitting: Internal Medicine

## 2024-02-16 ENCOUNTER — Ambulatory Visit (INDEPENDENT_AMBULATORY_CARE_PROVIDER_SITE_OTHER): Admitting: Internal Medicine

## 2024-02-16 VITALS — BP 140/82 | HR 70 | Temp 99.0°F | Ht 67.0 in | Wt 292.0 lb

## 2024-02-16 DIAGNOSIS — F3289 Other specified depressive episodes: Secondary | ICD-10-CM | POA: Diagnosis not present

## 2024-02-16 DIAGNOSIS — E662 Morbid (severe) obesity with alveolar hypoventilation: Secondary | ICD-10-CM

## 2024-02-16 DIAGNOSIS — J9601 Acute respiratory failure with hypoxia: Secondary | ICD-10-CM

## 2024-02-16 DIAGNOSIS — E782 Mixed hyperlipidemia: Secondary | ICD-10-CM

## 2024-02-16 DIAGNOSIS — J90 Pleural effusion, not elsewhere classified: Secondary | ICD-10-CM | POA: Diagnosis not present

## 2024-02-16 DIAGNOSIS — J9602 Acute respiratory failure with hypercapnia: Secondary | ICD-10-CM | POA: Diagnosis not present

## 2024-02-16 DIAGNOSIS — F411 Generalized anxiety disorder: Secondary | ICD-10-CM

## 2024-02-16 DIAGNOSIS — I10 Essential (primary) hypertension: Secondary | ICD-10-CM | POA: Diagnosis not present

## 2024-02-16 DIAGNOSIS — E1159 Type 2 diabetes mellitus with other circulatory complications: Secondary | ICD-10-CM

## 2024-02-16 LAB — CBC WITH DIFFERENTIAL/PLATELET
Basophils Absolute: 0.1 10*3/uL (ref 0.0–0.1)
Basophils Relative: 0.9 % (ref 0.0–3.0)
Eosinophils Absolute: 0.4 10*3/uL (ref 0.0–0.7)
Eosinophils Relative: 3.8 % (ref 0.0–5.0)
HCT: 42.5 % (ref 36.0–46.0)
Hemoglobin: 13.6 g/dL (ref 12.0–15.0)
Lymphocytes Relative: 26.3 % (ref 12.0–46.0)
Lymphs Abs: 2.8 10*3/uL (ref 0.7–4.0)
MCHC: 32 g/dL (ref 30.0–36.0)
MCV: 77.3 fl — ABNORMAL LOW (ref 78.0–100.0)
Monocytes Absolute: 0.8 10*3/uL (ref 0.1–1.0)
Monocytes Relative: 6.9 % (ref 3.0–12.0)
Neutro Abs: 6.7 10*3/uL (ref 1.4–7.7)
Neutrophils Relative %: 62.1 % (ref 43.0–77.0)
Platelets: 213 10*3/uL (ref 150.0–400.0)
RBC: 5.49 Mil/uL — ABNORMAL HIGH (ref 3.87–5.11)
RDW: 16.4 % — ABNORMAL HIGH (ref 11.5–15.5)
WBC: 10.8 10*3/uL — ABNORMAL HIGH (ref 4.0–10.5)

## 2024-02-16 NOTE — Assessment & Plan Note (Signed)
 Chronic Management per endocrine Lab Results  Component Value Date   HGBA1C 11.5 (A) 11/19/2023    Sees Dr Trixie tomorrow

## 2024-02-16 NOTE — Assessment & Plan Note (Signed)
 Chronic Blood pressure elevated here today, but she did not take her morning blood pressure medications Stressed compliance with taking medication daily Monitor BP at home Continue amlodipine  10 mg daily, carvedilol  25 mg daily

## 2024-02-16 NOTE — Assessment & Plan Note (Addendum)
 Chronic She does feel like she is breathing better-feels the spirometry is helped the most-continue use Encouraged as much movement as possible. Advised follow-up with pulmonary

## 2024-02-16 NOTE — Assessment & Plan Note (Signed)
 Chronic Management per psychiatry Continued on Paxil  and gabapentin  Will follow-up with psychiatry Advised not to take any medications at the hospital discontinued

## 2024-02-16 NOTE — Assessment & Plan Note (Signed)
Chronic Continue simvastatin 20 mg daily

## 2024-02-16 NOTE — Assessment & Plan Note (Signed)
 Acute Related to decompensated obesity hypoventilation syndrome, COPD, right lower lobe pneumonia, hypercapnia Treated with doxycycline  Discontinued several medications Oxygenation here today 90% on room air Did have pleural effusion on last chest x-ray in the hospital-repeat today

## 2024-02-16 NOTE — Assessment & Plan Note (Signed)
 Chronic Management per psychiatry Continued on Paxil  and gabapentin  Several medications discontinued To follow-up with psychiatry Discussed that she was on too many medications and should try to avoid going on any additional medications

## 2024-02-16 NOTE — Assessment & Plan Note (Signed)
 Acute Pleural effusion seen on last chest x-ray in the hospital Repeat chest x-ray today

## 2024-02-17 ENCOUNTER — Ambulatory Visit: Admitting: Internal Medicine

## 2024-02-17 LAB — BASIC METABOLIC PANEL WITH GFR
BUN: 20 mg/dL (ref 6–23)
CO2: 29 meq/L (ref 19–32)
Calcium: 10.2 mg/dL (ref 8.4–10.5)
Chloride: 97 meq/L (ref 96–112)
Creatinine, Ser: 1.29 mg/dL — ABNORMAL HIGH (ref 0.40–1.20)
GFR: 42.9 mL/min — ABNORMAL LOW (ref >60.00)
Glucose, Bld: 170 mg/dL — ABNORMAL HIGH (ref 70–99)
Potassium: 3.9 meq/L (ref 3.5–5.1)
Sodium: 138 meq/L (ref 135–145)

## 2024-02-17 NOTE — Progress Notes (Deleted)
 Patient ID: Mary Acosta, female   DOB: May 11, 1956, 68 y.o.   MRN: [id number]   HPI: Mary Acosta is a 68 y.o.-year-old female, initially referred by her PCP, Dr. Geofm, returning for follow-up for DM2, dx in 2000, insulin -dependent since 2011, uncontrolled, with long-term complications (peripheral neuropathy, gastroparesis, cerebrovascular disease with history of TIA 2012, DKA 2015) and also history of thyroid  cancer and postsurgical hypothyroidism.  She previously saw my colleague, Dr. Kassie, up to 07/2019.  Last visit with me 5 months ago (she did not return after 1.5 months, as advised).    Interim hx: She denies nausea, abdominal pain.  Also, no chest pain or shortness of breath.  Before last visit, she had very high blood sugars in the 500s and 600s and a high lipase that she was taken off Ozempic .  At That time, she was on prednisone  for joint pains due to AKI on Celebrex .  She tells me that since last visit, she restarted Ozempic  after having had an abdominal CT that showed to the pancreas appears normal. She was able to stop morphine  since last visit  DM2: Reviewed HbA1c levels: Lab Results  Component Value Date   HGBA1C 11.5 (A) 11/19/2023   HGBA1C 13.0 (H) 05/27/2023   HGBA1C 12.1 (A) 12/24/2022   HGBA1C 10.6 (A) 08/08/2022   HGBA1C 11.9 (A) 05/10/2022   HGBA1C 8.1 (A) 10/09/2021   HGBA1C 8.6 (A) 06/04/2021   HGBA1C 6.9 (A) 01/15/2021   HGBA1C 7.6 (A) 10/13/2020   HGBA1C 8.1 (A) 07/05/2020   She was previously on: - Basaglar  80 units at bedtime -Lilly Cares >> stopped >> once a week >> 50 >> 80 >> 160 units daily >> 80 units twice a day  - Metformin  1000 mg with dinner >> 500 mg 2x a day - Ozempic  0.5 >> 1 mg weekly - - Lyumjev  10-20 >> 20-25 units before meals >> muscle cramps >> decreased: 14 units Prev. On Rybelsus  3 mg before breakfast-see patient assistance pgm  She was on N and R insulin  in the past. She was on metformin  in the past >> did not  work. She was prev. On Toujeo  180 units daily.  Currently on: - Metformin  500 mg 2x a day - Ozempic  1 mg weekly in a.m. >> stopped  2/2 high lipase >> restarted since last OV every 10 days - 1 mo ago - Tresiba  80 units daily  - started 1 week ago, prev. Basaglar  - U500 - 30 min before the meal: >> 50 units before B'fast Intermittently taking 40 units of Lyumjev  insulin .  Pt checks her sugars 4x  times a day, prev. w daily/ CGM (with receiver) - now not affordable: - am: 400-550 >> 150-260 - 2h after b'fast: n/c - lunch: 250-400 >> 150-260 - 2h after lunch: n/c - dinner: 350 >> 260 - 2h after dinner: 450-500 >> n/c - bedtime: n/c  Previously:   Lowest sugar was 226 >> 250 >> 40s; she has hypoglycemia awareness in the 70s. Highest sugar was 500s >> 561 >> Hi.  Glucometer: AccuChek, Prodigy  Pt's meals are: - Breakfast: bacon or sausage and eggs, cereal >> grits, or grilled cheese - Lunch: salad; hot dog, sandwiches >> ham and cheese sandwich or skips - Dinner: meat + veggies  >> larger - Snacks: not so much anymore Previously drinking Kool-Aid, now stopped.  However, she is still having that she is drinking High C juice.   She is status post gastric banding with  vagotomy in 2008.  She lost 148 pounds afterwards but then gained more than 100 pounds back.  No h/o pancreatitis or FH of MTC.  However, she had a high lipase in 05/2023.  Now off Ozempic .  No CKD, last BUN/creatinine:  Lab Results  Component Value Date   BUN 20 02/16/2024   BUN 18 02/08/2024   CREATININE 1.29 (H) 02/16/2024   CREATININE 0.87 02/08/2024   Lab Results  Component Value Date   MICRALBCREAT 1.5 05/27/2023   MICRALBCREAT 0.7 09/19/2017   MICRALBCREAT 0.4 02/05/2017   MICRALBCREAT 0.6 02/01/2016   MICRALBCREAT 1.2 12/07/2015  Not on ACE inhibitor/ARB.  + HL; last set of lipids: Lab Results  Component Value Date   CHOL 163 05/27/2023   HDL 32.30 (L) 05/27/2023   LDLCALC 88 05/27/2023    TRIG 178 (H) 02/03/2024   CHOLHDL 5 05/27/2023  On Zocor  20, Zetia  10.  - last eye exam was in 2023 reportedly: no DR. Stopped IO injections for macular degeneration.  -She has pain, numbness, tingling in her feet.  Last foot exam 12/24/2022.  On ASA 81.  Pt has FH of DM in mother.  Thyroid  cancer:  Reviewed her thyroid  cancer history: She is status post total thyroidectomy - [place] Sx 2007. 11/22/2005: THYROID , THYROIDECTOMY:   - PAPILLARY THYROID  CARCINOMA (0.5 CM).   - ADENOMATOID NODULES.    COMMENT   ONCOLOGY TABLE-THYROID     1. Maximum tumor size (cm): 0.5 cm   2. Tumor location: Right mid lobe   3. Multifocality: Not identified   4. Histology: Papillary thyroid  carcinoma   5. Margins: Negative   6. Capsular invasion: Present   7. Extrathyroidal extension: Not identified   8. Vascular/Lymphatic invasion: Not identified   9. Lymph nodes: N/A   10. TNM code: pT1, pNX, pMX   11. Comments: Examination of the thyroidectomy specimen   grossly demonstrates multiple nodules. Histologic evaluation   demonstrates numerous adenomatoid nodules. One nodule measuring   0.5 cm demonstrates features compatible with papillary thyroid    carcinoma. The surgical margins are negative. (MS:jy) 11/25/05   Neck U/S (10/25/2020): no abnormal neck masses  Postsurgical hypothyroidism:  She takes 175 mcg levothyroxine  daily (dose increased 11/2022): - no missed doses anymore - in am - fasting - at least 30 min from b'fast - no calcium  - no iron - no multivitamins - no PPIs - not on Biotin  Reviewed her TFTs: Lab Results  Component Value Date   TSH 0.111 (L) 02/03/2024   TSH 3.01 05/27/2023   TSH 10.66 (H) 12/24/2022   TSH 4.62 05/10/2022   TSH 0.13 (L) 10/09/2021   TSH 0.51 02/02/2021   TSH 0.14 (L) 08/04/2020   TSH 4.42 05/03/2020   TSH 1.50 10/22/2019   TSH 1.09 07/20/2019   TSH 1.32 04/01/2019   TSH 0.55 09/04/2018   TSH 0.90 12/26/2017   TSH 0.113 (L)  12/08/2017   TSH 0.22 (L) 09/19/2017   Pt denies: - feeling nodules in neck - hoarseness - dysphagia - choking  She also has a history of COPD, OSA, fibromyalgia, depression, fatty liver-cirrhosis, DDD, h/o vb fx, PMR. She is seen in pain clinic in [facility].  She is on Neurontin  and amitriptyline . She lost 2 sons: 2020 and 09/2020.    ROS: + see HPI  I reviewed pt's medications, allergies, PMH, social hx, family hx, and changes were documented in the history of present illness. Otherwise, unchanged from my initial visit note.  Past Medical  History:  Diagnosis Date   AKI (acute kidney injury) (HCC) 01/2017   Allergy    seasonal   Anxiety    with panic attacks   Arthritis    back; feet; hands; shoulders (08/26/2014)   Asthma    Cataract    forming   Cervical cancer (HCC)    Chronic lower back pain    Chronic narcotic use    Chronic pain syndrome    [facility]   Cirrhosis (HCC)    Clostridium difficile infection 2017   COPD (chronic obstructive pulmonary disease) (HCC)    Daily headache    past hx   Depression    Diabetic neuropathy (HCC) 06/04/2017   feet  and legs , some in hands   DJD (degenerative joint disease)    Fatty liver disease, nonalcoholic    Fibromyalgia    Frequency of urination    HCAP (healthcare-associated pneumonia) 08/26/2014   History of TIA (transient ischemic attack) 11/01/2010   NO RESIDUAL   Hyperlipidemia    Hypertension    Hypothyroidism    IDDM (insulin  dependent diabetes mellitus)    Insomnia    Lumbar stenosis    Macular degeneration    Memory difficulty 07/25/2016   Nocturia    OSA (obstructive sleep apnea)    NO CPAP SINCE WT LOSS   Osteoarthritis    with severe disease in knee   Oxygen  deficiency    4  liters at bedtime only   Pneumonia several times   Polymyalgia rheumatica (HCC)    Scoliosis    Seasonal allergies    Stroke (HCC)    TIA   Thyroid  cancer (HCC)    Urgency of urination    Vaginal  pain S/P SLING  FEB 2012   Past Surgical History:  Procedure Laterality Date   APPENDECTOMY  1982   BREAST EXCISIONAL BIOPSY Left 02/28/2005   Atypical Ductal Hyperplasia   CARDIAC CATHETERIZATION  09/04/2004   NORMAL CORONARY ANATOMY/ NORMAL LVF/ EF 60%   CARDIOVASCULAR STRESS TEST  12-27-2010  DR SWAZILAND   ABNORMAL NUCLEAR STUDY W/ /MILD INFERIOR ISCHEMIA/ EF 69%/  CT HEART ANGIOGRAM ;  NO ACUTE FINDINGS   COLONOSCOPY     CRYOABLATION  05/16/2003   w/LEEP FOR ABNORMAL PAP SMEAR   CYSTOSCOPY  05/18/2012   Procedure: CYSTOSCOPY;  Surgeon: Glendia DELENA Elizabeth, MD;  Location: [facility] [place];  Service: Urology;  Laterality: N/A;  examination under anethesia   ESOPHAGOGASTRODUODENOSCOPY (EGD) WITH PROPOFOL  N/A 09/03/2016   Procedure: ESOPHAGOGASTRODUODENOSCOPY (EGD) WITH PROPOFOL ;  Surgeon: Victory LITTIE Legrand DOUGLAS, MD;  Location: [facility];  Service: Gastroenterology;  Laterality: N/A;   ESOPHAGOGASTRODUODENOSCOPY (EGD) WITH PROPOFOL  N/A 11/19/2021   Procedure: ESOPHAGOGASTRODUODENOSCOPY (EGD) WITH PROPOFOL ;  Surgeon: Legrand Victory LITTIE DOUGLAS, MD;  Location: [facility];  Service: Gastroenterology;  Laterality: N/A;  Varices screeing, cirrhosis   HYSTEROSCOPY WITH D & C  08/19/2007   PMB   KNEE ARTHROSCOPY W/ DEBRIDEMENT Left 03/29/2006   INTERNAL DERANGEMENT/ SEVERE DJD/ MENISCUS TEARS   LAPAROSCOPIC CHOLECYSTECTOMY  06/10/2002   LAPAROSCOPIC GASTRIC BANDING  03/01/2006   TRUNCAL VAGOTOMY/ PLACEMENT OF VG BAND   REVISION TOTAL KNEE ARTHROPLASTY Left 08-29-2008; 05/2009   TONSILLECTOMY  1969   TOTAL KNEE ARTHROPLASTY Left 01/23/2007   SEVERE DJD   TOTAL THYROIDECTOMY  11/22/2005   BILATERAL THYROID  NODULES-- PAPILLARY CARCINOMA (0.5CM)/ ADENOMATOID NODULES   TRANSTHORACIC ECHOCARDIOGRAM  12/27/2010   LVSF NORMAL / EF 55-65%/ GRADE I DIASTOLIC DYSFUNCTION/ MILD  MITRAL REGURG. / MILDLY DILATED LEFT ATRIUM/ MILDY INCREASED SYSTOLIC PRESSURE OF PULMONARY ARTERIES   TRANSVAGINAL  SUBURETERAL TAPE/ SLING  09/28/2010   MIXED URINARY INCONTINENCE   TUBAL LIGATION  1983   Social History   Socioeconomic History   Marital status: Married    Spouse name: Gaile   Number of children: 2   Years of education: 12   Highest education level: Not on file  Occupational History   Occupation: disabled    Associate Professor: UNEMPLOYED  Tobacco Use   Smoking status: Former    Current packs/day: 0.00    Average packs/day: 2.5 packs/day for 39.0 years (97.5 ttl pk-yrs)    Types: Cigarettes    Start date: 10/23/1971    Quit date: 10/22/2010    Years since quitting: 13.3   Smokeless tobacco: Never  Vaping Use   Vaping status: Never Used  Substance and Sexual Activity   Alcohol use: No    Alcohol/week: 0.0 standard drinks of alcohol   Drug use: No   Sexual activity: Not Currently  Other Topics Concern   Not on file  Social History Narrative   Lives at home w/ her husband    Right-handed   Caffeine: 1 cup of coffee per week + Pepsi      Lives with her husband and nephew and his wife.   Social Drivers of Corporate investment banker Strain: Low Risk  (12/23/2023)   Overall Financial Resource Strain (CARDIA)    Difficulty of Paying Living Expenses: Not very hard  Food Insecurity: No Food Insecurity (02/10/2024)   Hunger Vital Sign    Worried About Running Out of Food in the Last Year: Never true    Ran Out of Food in the Last Year: Never true  Transportation Needs: No Transportation Needs (02/10/2024)   PRAPARE - Administrator, Civil Service (Medical): No    Lack of Transportation (Non-Medical): No  Physical Activity: Inactive (12/23/2023)   Exercise Vital Sign    Days of Exercise per Week: 0 days    Minutes of Exercise per Session: 0 min  Stress: Stress Concern Present (12/23/2023)   [employer] of Occupational Health - Occupational Stress Questionnaire    Feeling of Stress : Very much  Social Connections: Moderately Isolated (12/23/2023)   Social Connection  and Isolation Panel    Frequency of Communication with Friends and Family: More than three times a week    Frequency of Social Gatherings with Friends and Family: Twice a week    Attends Religious Services: Never    Database administrator or Organizations: No    Attends Banker Meetings: Never    Marital Status: Married  Catering manager Violence: Not At Risk (02/10/2024)   Humiliation, Afraid, Rape, and Kick questionnaire    Fear of Current or Ex-Partner: No    Emotionally Abused: No    Physically Abused: No    Sexually Abused: No   Current Outpatient Medications on File Prior to Visit  Medication Sig Dispense Refill   albuterol  (VENTOLIN  HFA) 108 (90 Base) MCG/ACT inhaler INHALE TWO (2) PUFFS BY MOUTH EVERY 6 HOURS AS NEEDED FOR SHORTNESS OF BREATH AND WHEEZING 18 g 10   amLODipine  (NORVASC ) 10 MG tablet Take 1 tablet (10 mg total) by mouth daily. 30 tablet 0   atomoxetine (STRATTERA) 40 MG capsule Take 40 mg by mouth every morning.     baclofen (LIORESAL) 10 MG tablet Take 10 mg by mouth 2 (two)  times daily. 02/10/24: Reports during TOC call takes prn     Buprenorphine HCl (BELBUCA) 300 MCG FILM Place inside cheek.     carvedilol  (COREG ) 25 MG tablet Take 1 tablet (25 mg total) by mouth 2 (two) times daily with a meal. 60 tablet 0   colchicine  0.6 MG tablet Take 1 tablet (0.6 mg total) by mouth daily. 30 tablet 5   Continuous Blood Gluc Sensor (FREESTYLE LIBRE 3 SENSOR) MISC 1 each by Does not apply route every 14 (fourteen) days. 6 each 3   Continuous Glucose Sensor (DEXCOM G7 SENSOR) MISC 3 each by Does not apply route every 30 (thirty) days. Apply 1 sensor every 10 days 9 each 4   ergocalciferol  (VITAMIN D2) 1.25 MG (50000 UT) capsule Take 1 capsule (50,000 Units total) by mouth once a week. Monday 12 capsule 3   fexofenadine (ALLEGRA) 180 MG tablet Take 180 mg by mouth daily as needed for allergies or rhinitis.     folic acid (FOLVITE) 1 MG tablet Take 1 mg by mouth  daily.     furosemide  (LASIX ) 40 MG tablet TAKE 1 TABLET BY MOUTH EACH MORNING 30 tablet 10   gabapentin  (NEURONTIN ) 300 MG capsule Take 300 mg by mouth 2 (two) times daily.     hydrochlorothiazide  (HYDRODIURIL ) 25 MG tablet Take 1 tablet (25 mg total) by mouth daily. 30 tablet 0   insulin  glargine, 1 Unit Dial, (TOUJEO  SOLOSTAR) 300 UNIT/ML Solostar Pen Inject 25 Units into the skin 2 (two) times daily.     Insulin  Lispro-aabc (LYUMJEV  KWIKPEN) 200 UNIT/ML KwikPen Inject 10 Units into the skin 3 (three) times daily. 02/10/24: Reports during TOC call endocrinology provider prescribed this with sliding scale to take with meals     Insulin  Pen Needle (BD PEN NEEDLE NANO U/F) 32G X 4 MM MISC USE AS DIRECTED FIVE TIMES A DAY 100 each 10   Insulin  Syringe-Needle U-100 26G X 1/2 1 ML MISC Use daily for insulin  injection as directed 100 each 3   Ipratropium-Albuterol  (COMBIVENT  RESPIMAT) 20-100 MCG/ACT AERS respimat Inhale 1 puff into the lungs every 6 (six) hours as needed for wheezing. 4 g 5   ipratropium-albuterol  (DUONEB) 0.5-2.5 (3) MG/3ML SOLN Take 3 mLs by nebulization every 6 (six) hours as needed (Wheezing or dyspnea.). 360 mL 11   levothyroxine  (SYNTHROID ) 175 MCG tablet TAKE 1 TABLET BY MOUTH ONCE DAILY BEFORE BREAKFAST 30 tablet 10   lidocaine  (LIDODERM ) 5 % Place 1 patch onto the skin daily. Patient uses these patches on her knees.     metFORMIN  (GLUCOPHAGE ) 500 MG tablet TAKE 1 TABLET BY MOUTH EACH MORNING AND TAKE 1 TABLET EVERY DAY AT BEDTIME 180 tablet 3   naloxone  (NARCAN ) nasal spray 4 mg/0.1 mL One spray in either nostril once for known/suspected opioid overdose. May repeat every 2-3 minutes in alternating nostril til EMS arrives     PARoxetine  (PAXIL ) 20 MG tablet Take 20 mg by mouth daily.     PARoxetine  (PAXIL ) 40 MG tablet Take 1 tablet (40 mg total) by mouth daily. 90 tablet 1   Propylene Glycol (SYSTANE BALANCE) 0.6 % SOLN Place 1 drop into both eyes daily as needed (dry eyes).      Semaglutide , 1 MG/DOSE, (OZEMPIC , 1 MG/DOSE,) 4 MG/3ML SOPN INJECT 1MG  INTO THE SKIN ONCE A WEEK 9 mL 3   simvastatin  (ZOCOR ) 20 MG tablet TAKE 1 TABLET BY MOUTH EVERY DAY AT BEDTIME 90 tablet 1   TRESIBA  FLEXTOUCH 200 UNIT/ML FlexTouch  Pen SMARTSIG:80 Unit(s) SUB-Q Daily     No current facility-administered medications on file prior to visit.   Allergies  Allergen Reactions   Gabapentin  Swelling    Swelling in legs    Losartan Other (See Comments)    Myalgias and muscle cramping    Aricept  [Donepezil  Hcl]     Nausea/vomiting, low BP   Aripiprazole Other (See Comments)    Patient reports Hypoglycemia   Hydroxyzine      hallucinations   Oxycodone-Acetaminophen  Itching   Oxycodone Itching   Sulfa Antibiotics Nausea Only and Rash   Sulfonamide Derivatives Nausea Only and Rash   Family History  Problem Relation Age of Onset   Colon polyps Mother    Diabetes Mother    Heart disease Mother    Dementia Mother    Heart disease Father    High blood pressure Father    Colon cancer Maternal Uncle        x 3   Stomach cancer Paternal Uncle    Breast cancer Other        great aunts x 5   Esophageal cancer Neg Hx    Rectal cancer Neg Hx    PE: There were no vitals taken for this visit. Wt Readings from Last 10 Encounters:  02/16/24 292 lb (132.5 kg)  02/08/24 296 lb 4.8 oz (134.4 kg)  01/13/24 300 lb (136.1 kg)  12/23/23 300 lb (136.1 kg)  12/04/23 300 lb (136.1 kg)  11/19/23 (!) 316 lb 3.2 oz (143.4 kg)  10/10/23 (!) 308 lb 4 oz (139.8 kg)  06/09/23 292 lb 9.6 oz (132.7 kg)  02/28/23 (!) 317 lb (143.8 kg)  02/27/23 (!) 320 lb (145.2 kg)   Constitutional: overweight, in NAD, walks with a walker Eyes:  EOMI, no exophthalmos ENT: no neck masses, no cervical lymphadenopathy Cardiovascular: RRR, No MRG Respiratory: CTA B Musculoskeletal: no deformities Skin: + rash on shins Neurological: no tremor with outstretched hands  ASSESSMENT: 1. DM2, insulin -dependent,  uncontrolled, with complications - cerebrovascular disease with history of TIA 2012 - peripheral neuropathy - gastroparesis - DKA 2015  2.  History of thyroid  cancer  3.  Postsurgical hypothyroidism  PLAN:  1. Patient with longstanding, uncontrolled, type 2 diabetes, on weekly GLP-1 receptor agonist previously but then off after lipase returned elevated at 131 2 weeks prior to our last appointment.  Unfortunately, after coming off Ozempic , her sugars increased dramatically, to 500s and 600s especially as at last visit she was also on prednisone  for joint aches as she had to stop Celebrex  due to AKI. -At last visit we discussed about increasing the doses of U-500 insulin  with lunch and dinner and backing up the morning dose.  I also advised her to use Lyumjev  insulin  for correction.  In an effort to improve her insulin  resistance, I advised her to try to do a water  fast 1 day a week and during this, not to take insulin  or metformin . -Since last visit, she sent me a message that she actually switched to Basaglar  as U-500 insulin  was dropping her blood sugars too low  -At today's visit, she tells me that she switch from Basaglar  to her son's Tresiba  and is using 80 units of it.  She is only taking the U-500 insulin ,  50 units before breakfast and she occasionally takes lispro before a meal later in the day. She tells me she restarted Ozempic  1 mo ago >> tolerated well. I believe that this is the reason why her sugars started  to improve. I reviewed her latest CT scan from last month and the pancreas appears to be without inflammation or any masses.  -Her sugars at home and appear to have improved, now half of the ones she had in the morning at last visit.  Later in the day, sugars are higher and this is probably due to the fact that she is not taking insulin  consistently before the later meals.  For now, since we have been unable to control her blood sugars follow-up the GLP-1 receptor agonist in the  past, I recommended to continue Ozempic  but to stop it immediately if she develops any new symptoms.  For now, we also discussed about staying off U-500 insulin  but continue with the same dose of Tresiba  (I sent a prescription to her pharmacy), and use higher doses of Lyumjev  before every meal.  We also discussed about using Lyumjev  for correction. -I recommended to try to restart the CGM and sent a prescription for the Dexcom CGM to her pharmacy. - I suggested to:  Patient Instructions  Please continue: - Metformin  500 mg 2x a day - Tresiba  80 units daily - Ozempic  1 mg weekly or every 1.5 weekly - Lyumjev : 40 units before a smaller meal 50 units before a regular meal 60-70 units before a large meal You may need to take 10-15 units of Lyumjev  insulin  only for correction.  Try to start the Dexcom CGM.  Please continue Levothyroxine  175 mcg daily.   Take the thyroid  hormone every day, with water , at least 30 minutes before breakfast, separated by at least 4 hours from: - acid reflux medications - calcium  - iron - multivitamins   Please return in 2-3 months.        - we checked her HbA1c: 11.7% (lower, but still very elevated) - advised to check sugars at different times of the day - 4x a day, rotating check times - advised for yearly eye exams >> she is UTD - return to clinic in 1.5 months  2.  History of thyroid  cancer -She had a papillary thyroid  cancer focus in the right thyroid  lobe, measuring 0.5 cm.  No vascular, lymphatic or direct extension into the surrounding tissues -No neck compression symptoms -The neck ultrasound from 03/22 showed no suspicious masses -She is most likely cured  3.  Postsurgical hypothyroidism - latest thyroid  labs reviewed with pt. >> normal: Lab Results  Component Value Date   TSH 0.111 (L) 02/03/2024  - she continues on LT4 175 mcg daily - pt feels good on this dose. - we discussed about taking the thyroid  hormone every day, with water , >30  minutes before breakfast, separated by >4 hours from acid reflux medications, calcium , iron, multivitamins. Pt. is taking it correctly.  Lela Fendt, MD PhD [facility]

## 2024-02-18 DIAGNOSIS — K7460 Unspecified cirrhosis of liver: Secondary | ICD-10-CM | POA: Diagnosis not present

## 2024-02-18 DIAGNOSIS — E1140 Type 2 diabetes mellitus with diabetic neuropathy, unspecified: Secondary | ICD-10-CM | POA: Diagnosis not present

## 2024-02-18 DIAGNOSIS — J440 Chronic obstructive pulmonary disease with acute lower respiratory infection: Secondary | ICD-10-CM | POA: Diagnosis not present

## 2024-02-18 DIAGNOSIS — Z7985 Long-term (current) use of injectable non-insulin antidiabetic drugs: Secondary | ICD-10-CM | POA: Diagnosis not present

## 2024-02-18 DIAGNOSIS — G8929 Other chronic pain: Secondary | ICD-10-CM | POA: Diagnosis not present

## 2024-02-18 DIAGNOSIS — Z7984 Long term (current) use of oral hypoglycemic drugs: Secondary | ICD-10-CM | POA: Diagnosis not present

## 2024-02-18 DIAGNOSIS — Z794 Long term (current) use of insulin: Secondary | ICD-10-CM | POA: Diagnosis not present

## 2024-02-18 DIAGNOSIS — F32A Depression, unspecified: Secondary | ICD-10-CM | POA: Diagnosis not present

## 2024-02-18 DIAGNOSIS — E785 Hyperlipidemia, unspecified: Secondary | ICD-10-CM | POA: Diagnosis not present

## 2024-02-18 DIAGNOSIS — M545 Low back pain, unspecified: Secondary | ICD-10-CM | POA: Diagnosis not present

## 2024-02-18 DIAGNOSIS — M797 Fibromyalgia: Secondary | ICD-10-CM | POA: Diagnosis not present

## 2024-02-18 DIAGNOSIS — J9622 Acute and chronic respiratory failure with hypercapnia: Secondary | ICD-10-CM | POA: Diagnosis not present

## 2024-02-18 DIAGNOSIS — F419 Anxiety disorder, unspecified: Secondary | ICD-10-CM | POA: Diagnosis not present

## 2024-02-18 DIAGNOSIS — J189 Pneumonia, unspecified organism: Secondary | ICD-10-CM | POA: Diagnosis not present

## 2024-02-18 DIAGNOSIS — Z85850 Personal history of malignant neoplasm of thyroid: Secondary | ICD-10-CM | POA: Diagnosis not present

## 2024-02-18 DIAGNOSIS — E039 Hypothyroidism, unspecified: Secondary | ICD-10-CM | POA: Diagnosis not present

## 2024-02-18 DIAGNOSIS — J9621 Acute and chronic respiratory failure with hypoxia: Secondary | ICD-10-CM | POA: Diagnosis not present

## 2024-02-18 DIAGNOSIS — I10 Essential (primary) hypertension: Secondary | ICD-10-CM | POA: Diagnosis not present

## 2024-02-18 DIAGNOSIS — Z9981 Dependence on supplemental oxygen: Secondary | ICD-10-CM | POA: Diagnosis not present

## 2024-02-18 DIAGNOSIS — M353 Polymyalgia rheumatica: Secondary | ICD-10-CM | POA: Diagnosis not present

## 2024-02-19 DIAGNOSIS — Z7984 Long term (current) use of oral hypoglycemic drugs: Secondary | ICD-10-CM | POA: Diagnosis not present

## 2024-02-19 DIAGNOSIS — Z7985 Long-term (current) use of injectable non-insulin antidiabetic drugs: Secondary | ICD-10-CM | POA: Diagnosis not present

## 2024-02-19 DIAGNOSIS — I10 Essential (primary) hypertension: Secondary | ICD-10-CM | POA: Diagnosis not present

## 2024-02-19 DIAGNOSIS — M353 Polymyalgia rheumatica: Secondary | ICD-10-CM | POA: Diagnosis not present

## 2024-02-19 DIAGNOSIS — K7460 Unspecified cirrhosis of liver: Secondary | ICD-10-CM | POA: Diagnosis not present

## 2024-02-19 DIAGNOSIS — M545 Low back pain, unspecified: Secondary | ICD-10-CM | POA: Diagnosis not present

## 2024-02-19 DIAGNOSIS — F32A Depression, unspecified: Secondary | ICD-10-CM | POA: Diagnosis not present

## 2024-02-19 DIAGNOSIS — E039 Hypothyroidism, unspecified: Secondary | ICD-10-CM | POA: Diagnosis not present

## 2024-02-19 DIAGNOSIS — J9622 Acute and chronic respiratory failure with hypercapnia: Secondary | ICD-10-CM | POA: Diagnosis not present

## 2024-02-19 DIAGNOSIS — J440 Chronic obstructive pulmonary disease with acute lower respiratory infection: Secondary | ICD-10-CM | POA: Diagnosis not present

## 2024-02-19 DIAGNOSIS — G8929 Other chronic pain: Secondary | ICD-10-CM | POA: Diagnosis not present

## 2024-02-19 DIAGNOSIS — Z794 Long term (current) use of insulin: Secondary | ICD-10-CM | POA: Diagnosis not present

## 2024-02-19 DIAGNOSIS — F419 Anxiety disorder, unspecified: Secondary | ICD-10-CM | POA: Diagnosis not present

## 2024-02-19 DIAGNOSIS — E1140 Type 2 diabetes mellitus with diabetic neuropathy, unspecified: Secondary | ICD-10-CM | POA: Diagnosis not present

## 2024-02-19 DIAGNOSIS — Z85850 Personal history of malignant neoplasm of thyroid: Secondary | ICD-10-CM | POA: Diagnosis not present

## 2024-02-19 DIAGNOSIS — J189 Pneumonia, unspecified organism: Secondary | ICD-10-CM | POA: Diagnosis not present

## 2024-02-19 DIAGNOSIS — M797 Fibromyalgia: Secondary | ICD-10-CM | POA: Diagnosis not present

## 2024-02-19 DIAGNOSIS — E785 Hyperlipidemia, unspecified: Secondary | ICD-10-CM | POA: Diagnosis not present

## 2024-02-19 DIAGNOSIS — Z9981 Dependence on supplemental oxygen: Secondary | ICD-10-CM | POA: Diagnosis not present

## 2024-02-19 DIAGNOSIS — J9621 Acute and chronic respiratory failure with hypoxia: Secondary | ICD-10-CM | POA: Diagnosis not present

## 2024-02-20 DIAGNOSIS — J189 Pneumonia, unspecified organism: Secondary | ICD-10-CM | POA: Diagnosis not present

## 2024-02-20 DIAGNOSIS — E039 Hypothyroidism, unspecified: Secondary | ICD-10-CM | POA: Diagnosis not present

## 2024-02-20 DIAGNOSIS — Z7984 Long term (current) use of oral hypoglycemic drugs: Secondary | ICD-10-CM | POA: Diagnosis not present

## 2024-02-20 DIAGNOSIS — M353 Polymyalgia rheumatica: Secondary | ICD-10-CM | POA: Diagnosis not present

## 2024-02-20 DIAGNOSIS — Z7985 Long-term (current) use of injectable non-insulin antidiabetic drugs: Secondary | ICD-10-CM | POA: Diagnosis not present

## 2024-02-20 DIAGNOSIS — F419 Anxiety disorder, unspecified: Secondary | ICD-10-CM | POA: Diagnosis not present

## 2024-02-20 DIAGNOSIS — Z9981 Dependence on supplemental oxygen: Secondary | ICD-10-CM | POA: Diagnosis not present

## 2024-02-20 DIAGNOSIS — F32A Depression, unspecified: Secondary | ICD-10-CM | POA: Diagnosis not present

## 2024-02-20 DIAGNOSIS — J440 Chronic obstructive pulmonary disease with acute lower respiratory infection: Secondary | ICD-10-CM | POA: Diagnosis not present

## 2024-02-20 DIAGNOSIS — J9621 Acute and chronic respiratory failure with hypoxia: Secondary | ICD-10-CM | POA: Diagnosis not present

## 2024-02-20 DIAGNOSIS — M545 Low back pain, unspecified: Secondary | ICD-10-CM | POA: Diagnosis not present

## 2024-02-20 DIAGNOSIS — E1140 Type 2 diabetes mellitus with diabetic neuropathy, unspecified: Secondary | ICD-10-CM | POA: Diagnosis not present

## 2024-02-20 DIAGNOSIS — I10 Essential (primary) hypertension: Secondary | ICD-10-CM | POA: Diagnosis not present

## 2024-02-20 DIAGNOSIS — J9622 Acute and chronic respiratory failure with hypercapnia: Secondary | ICD-10-CM | POA: Diagnosis not present

## 2024-02-20 DIAGNOSIS — G8929 Other chronic pain: Secondary | ICD-10-CM | POA: Diagnosis not present

## 2024-02-20 DIAGNOSIS — M797 Fibromyalgia: Secondary | ICD-10-CM | POA: Diagnosis not present

## 2024-02-20 DIAGNOSIS — K7460 Unspecified cirrhosis of liver: Secondary | ICD-10-CM | POA: Diagnosis not present

## 2024-02-20 DIAGNOSIS — E785 Hyperlipidemia, unspecified: Secondary | ICD-10-CM | POA: Diagnosis not present

## 2024-02-20 DIAGNOSIS — Z85850 Personal history of malignant neoplasm of thyroid: Secondary | ICD-10-CM | POA: Diagnosis not present

## 2024-02-20 DIAGNOSIS — Z794 Long term (current) use of insulin: Secondary | ICD-10-CM | POA: Diagnosis not present

## 2024-02-23 ENCOUNTER — Other Ambulatory Visit: Payer: Self-pay | Admitting: Cardiology

## 2024-02-23 DIAGNOSIS — H35033 Hypertensive retinopathy, bilateral: Secondary | ICD-10-CM | POA: Diagnosis not present

## 2024-02-23 DIAGNOSIS — H2513 Age-related nuclear cataract, bilateral: Secondary | ICD-10-CM | POA: Diagnosis not present

## 2024-02-23 DIAGNOSIS — H353231 Exudative age-related macular degeneration, bilateral, with active choroidal neovascularization: Secondary | ICD-10-CM | POA: Diagnosis not present

## 2024-02-23 DIAGNOSIS — H43813 Vitreous degeneration, bilateral: Secondary | ICD-10-CM | POA: Diagnosis not present

## 2024-02-25 ENCOUNTER — Other Ambulatory Visit: Payer: Self-pay | Admitting: Gastroenterology

## 2024-02-25 DIAGNOSIS — E1140 Type 2 diabetes mellitus with diabetic neuropathy, unspecified: Secondary | ICD-10-CM | POA: Diagnosis not present

## 2024-02-25 DIAGNOSIS — J189 Pneumonia, unspecified organism: Secondary | ICD-10-CM | POA: Diagnosis not present

## 2024-02-25 DIAGNOSIS — J9621 Acute and chronic respiratory failure with hypoxia: Secondary | ICD-10-CM | POA: Diagnosis not present

## 2024-02-25 DIAGNOSIS — E785 Hyperlipidemia, unspecified: Secondary | ICD-10-CM | POA: Diagnosis not present

## 2024-02-25 DIAGNOSIS — M797 Fibromyalgia: Secondary | ICD-10-CM | POA: Diagnosis not present

## 2024-02-25 DIAGNOSIS — I10 Essential (primary) hypertension: Secondary | ICD-10-CM | POA: Diagnosis not present

## 2024-02-25 DIAGNOSIS — Z9981 Dependence on supplemental oxygen: Secondary | ICD-10-CM | POA: Diagnosis not present

## 2024-02-25 DIAGNOSIS — M545 Low back pain, unspecified: Secondary | ICD-10-CM | POA: Diagnosis not present

## 2024-02-25 DIAGNOSIS — G8929 Other chronic pain: Secondary | ICD-10-CM | POA: Diagnosis not present

## 2024-02-25 DIAGNOSIS — K7460 Unspecified cirrhosis of liver: Secondary | ICD-10-CM | POA: Diagnosis not present

## 2024-02-25 DIAGNOSIS — Z7985 Long-term (current) use of injectable non-insulin antidiabetic drugs: Secondary | ICD-10-CM | POA: Diagnosis not present

## 2024-02-25 DIAGNOSIS — J9622 Acute and chronic respiratory failure with hypercapnia: Secondary | ICD-10-CM | POA: Diagnosis not present

## 2024-02-25 DIAGNOSIS — F32A Depression, unspecified: Secondary | ICD-10-CM | POA: Diagnosis not present

## 2024-02-25 DIAGNOSIS — Z794 Long term (current) use of insulin: Secondary | ICD-10-CM | POA: Diagnosis not present

## 2024-02-25 DIAGNOSIS — Z85850 Personal history of malignant neoplasm of thyroid: Secondary | ICD-10-CM | POA: Diagnosis not present

## 2024-02-25 DIAGNOSIS — E039 Hypothyroidism, unspecified: Secondary | ICD-10-CM | POA: Diagnosis not present

## 2024-02-25 DIAGNOSIS — J440 Chronic obstructive pulmonary disease with acute lower respiratory infection: Secondary | ICD-10-CM | POA: Diagnosis not present

## 2024-02-25 DIAGNOSIS — M353 Polymyalgia rheumatica: Secondary | ICD-10-CM | POA: Diagnosis not present

## 2024-02-25 DIAGNOSIS — F419 Anxiety disorder, unspecified: Secondary | ICD-10-CM | POA: Diagnosis not present

## 2024-02-25 DIAGNOSIS — Z7984 Long term (current) use of oral hypoglycemic drugs: Secondary | ICD-10-CM | POA: Diagnosis not present

## 2024-02-26 DIAGNOSIS — J189 Pneumonia, unspecified organism: Secondary | ICD-10-CM | POA: Diagnosis not present

## 2024-02-26 DIAGNOSIS — E039 Hypothyroidism, unspecified: Secondary | ICD-10-CM | POA: Diagnosis not present

## 2024-02-26 DIAGNOSIS — M797 Fibromyalgia: Secondary | ICD-10-CM | POA: Diagnosis not present

## 2024-02-26 DIAGNOSIS — J440 Chronic obstructive pulmonary disease with acute lower respiratory infection: Secondary | ICD-10-CM | POA: Diagnosis not present

## 2024-02-26 DIAGNOSIS — Z85850 Personal history of malignant neoplasm of thyroid: Secondary | ICD-10-CM | POA: Diagnosis not present

## 2024-02-26 DIAGNOSIS — Z9981 Dependence on supplemental oxygen: Secondary | ICD-10-CM | POA: Diagnosis not present

## 2024-02-26 DIAGNOSIS — K7460 Unspecified cirrhosis of liver: Secondary | ICD-10-CM | POA: Diagnosis not present

## 2024-02-26 DIAGNOSIS — J9622 Acute and chronic respiratory failure with hypercapnia: Secondary | ICD-10-CM | POA: Diagnosis not present

## 2024-02-26 DIAGNOSIS — Z7985 Long-term (current) use of injectable non-insulin antidiabetic drugs: Secondary | ICD-10-CM | POA: Diagnosis not present

## 2024-02-26 DIAGNOSIS — E1140 Type 2 diabetes mellitus with diabetic neuropathy, unspecified: Secondary | ICD-10-CM | POA: Diagnosis not present

## 2024-02-26 DIAGNOSIS — F32A Depression, unspecified: Secondary | ICD-10-CM | POA: Diagnosis not present

## 2024-02-26 DIAGNOSIS — E785 Hyperlipidemia, unspecified: Secondary | ICD-10-CM | POA: Diagnosis not present

## 2024-02-26 DIAGNOSIS — M545 Low back pain, unspecified: Secondary | ICD-10-CM | POA: Diagnosis not present

## 2024-02-26 DIAGNOSIS — G8929 Other chronic pain: Secondary | ICD-10-CM | POA: Diagnosis not present

## 2024-02-26 DIAGNOSIS — F419 Anxiety disorder, unspecified: Secondary | ICD-10-CM | POA: Diagnosis not present

## 2024-02-26 DIAGNOSIS — M353 Polymyalgia rheumatica: Secondary | ICD-10-CM | POA: Diagnosis not present

## 2024-02-26 DIAGNOSIS — I10 Essential (primary) hypertension: Secondary | ICD-10-CM | POA: Diagnosis not present

## 2024-02-26 DIAGNOSIS — Z7984 Long term (current) use of oral hypoglycemic drugs: Secondary | ICD-10-CM | POA: Diagnosis not present

## 2024-02-26 DIAGNOSIS — J9621 Acute and chronic respiratory failure with hypoxia: Secondary | ICD-10-CM | POA: Diagnosis not present

## 2024-02-26 DIAGNOSIS — Z794 Long term (current) use of insulin: Secondary | ICD-10-CM | POA: Diagnosis not present

## 2024-03-02 DIAGNOSIS — J9622 Acute and chronic respiratory failure with hypercapnia: Secondary | ICD-10-CM | POA: Diagnosis not present

## 2024-03-02 DIAGNOSIS — M797 Fibromyalgia: Secondary | ICD-10-CM | POA: Diagnosis not present

## 2024-03-02 DIAGNOSIS — M353 Polymyalgia rheumatica: Secondary | ICD-10-CM | POA: Diagnosis not present

## 2024-03-02 DIAGNOSIS — M545 Low back pain, unspecified: Secondary | ICD-10-CM | POA: Diagnosis not present

## 2024-03-02 DIAGNOSIS — Z9981 Dependence on supplemental oxygen: Secondary | ICD-10-CM | POA: Diagnosis not present

## 2024-03-02 DIAGNOSIS — E1140 Type 2 diabetes mellitus with diabetic neuropathy, unspecified: Secondary | ICD-10-CM | POA: Diagnosis not present

## 2024-03-02 DIAGNOSIS — J189 Pneumonia, unspecified organism: Secondary | ICD-10-CM | POA: Diagnosis not present

## 2024-03-02 DIAGNOSIS — F419 Anxiety disorder, unspecified: Secondary | ICD-10-CM | POA: Diagnosis not present

## 2024-03-02 DIAGNOSIS — F32A Depression, unspecified: Secondary | ICD-10-CM | POA: Diagnosis not present

## 2024-03-02 DIAGNOSIS — J9621 Acute and chronic respiratory failure with hypoxia: Secondary | ICD-10-CM | POA: Diagnosis not present

## 2024-03-02 DIAGNOSIS — Z794 Long term (current) use of insulin: Secondary | ICD-10-CM | POA: Diagnosis not present

## 2024-03-02 DIAGNOSIS — Z85850 Personal history of malignant neoplasm of thyroid: Secondary | ICD-10-CM | POA: Diagnosis not present

## 2024-03-02 DIAGNOSIS — Z7984 Long term (current) use of oral hypoglycemic drugs: Secondary | ICD-10-CM | POA: Diagnosis not present

## 2024-03-02 DIAGNOSIS — I10 Essential (primary) hypertension: Secondary | ICD-10-CM | POA: Diagnosis not present

## 2024-03-02 DIAGNOSIS — E785 Hyperlipidemia, unspecified: Secondary | ICD-10-CM | POA: Diagnosis not present

## 2024-03-02 DIAGNOSIS — Z7985 Long-term (current) use of injectable non-insulin antidiabetic drugs: Secondary | ICD-10-CM | POA: Diagnosis not present

## 2024-03-02 DIAGNOSIS — E039 Hypothyroidism, unspecified: Secondary | ICD-10-CM | POA: Diagnosis not present

## 2024-03-02 DIAGNOSIS — K7460 Unspecified cirrhosis of liver: Secondary | ICD-10-CM | POA: Diagnosis not present

## 2024-03-02 DIAGNOSIS — J440 Chronic obstructive pulmonary disease with acute lower respiratory infection: Secondary | ICD-10-CM | POA: Diagnosis not present

## 2024-03-02 DIAGNOSIS — G8929 Other chronic pain: Secondary | ICD-10-CM | POA: Diagnosis not present

## 2024-03-03 DIAGNOSIS — E785 Hyperlipidemia, unspecified: Secondary | ICD-10-CM | POA: Diagnosis not present

## 2024-03-03 DIAGNOSIS — J9622 Acute and chronic respiratory failure with hypercapnia: Secondary | ICD-10-CM | POA: Diagnosis not present

## 2024-03-03 DIAGNOSIS — Z9981 Dependence on supplemental oxygen: Secondary | ICD-10-CM | POA: Diagnosis not present

## 2024-03-03 DIAGNOSIS — Z794 Long term (current) use of insulin: Secondary | ICD-10-CM | POA: Diagnosis not present

## 2024-03-03 DIAGNOSIS — Z85850 Personal history of malignant neoplasm of thyroid: Secondary | ICD-10-CM | POA: Diagnosis not present

## 2024-03-03 DIAGNOSIS — E039 Hypothyroidism, unspecified: Secondary | ICD-10-CM | POA: Diagnosis not present

## 2024-03-03 DIAGNOSIS — Z7984 Long term (current) use of oral hypoglycemic drugs: Secondary | ICD-10-CM | POA: Diagnosis not present

## 2024-03-03 DIAGNOSIS — F32A Depression, unspecified: Secondary | ICD-10-CM | POA: Diagnosis not present

## 2024-03-03 DIAGNOSIS — J440 Chronic obstructive pulmonary disease with acute lower respiratory infection: Secondary | ICD-10-CM | POA: Diagnosis not present

## 2024-03-03 DIAGNOSIS — M797 Fibromyalgia: Secondary | ICD-10-CM | POA: Diagnosis not present

## 2024-03-03 DIAGNOSIS — J189 Pneumonia, unspecified organism: Secondary | ICD-10-CM | POA: Diagnosis not present

## 2024-03-03 DIAGNOSIS — M353 Polymyalgia rheumatica: Secondary | ICD-10-CM | POA: Diagnosis not present

## 2024-03-03 DIAGNOSIS — I10 Essential (primary) hypertension: Secondary | ICD-10-CM | POA: Diagnosis not present

## 2024-03-03 DIAGNOSIS — J9621 Acute and chronic respiratory failure with hypoxia: Secondary | ICD-10-CM | POA: Diagnosis not present

## 2024-03-03 DIAGNOSIS — Z7985 Long-term (current) use of injectable non-insulin antidiabetic drugs: Secondary | ICD-10-CM | POA: Diagnosis not present

## 2024-03-03 DIAGNOSIS — G8929 Other chronic pain: Secondary | ICD-10-CM | POA: Diagnosis not present

## 2024-03-03 DIAGNOSIS — E1140 Type 2 diabetes mellitus with diabetic neuropathy, unspecified: Secondary | ICD-10-CM | POA: Diagnosis not present

## 2024-03-03 DIAGNOSIS — M545 Low back pain, unspecified: Secondary | ICD-10-CM | POA: Diagnosis not present

## 2024-03-03 DIAGNOSIS — F419 Anxiety disorder, unspecified: Secondary | ICD-10-CM | POA: Diagnosis not present

## 2024-03-03 DIAGNOSIS — K7460 Unspecified cirrhosis of liver: Secondary | ICD-10-CM | POA: Diagnosis not present

## 2024-03-10 DIAGNOSIS — E039 Hypothyroidism, unspecified: Secondary | ICD-10-CM | POA: Diagnosis not present

## 2024-03-10 DIAGNOSIS — F419 Anxiety disorder, unspecified: Secondary | ICD-10-CM | POA: Diagnosis not present

## 2024-03-10 DIAGNOSIS — G8929 Other chronic pain: Secondary | ICD-10-CM | POA: Diagnosis not present

## 2024-03-10 DIAGNOSIS — E785 Hyperlipidemia, unspecified: Secondary | ICD-10-CM | POA: Diagnosis not present

## 2024-03-10 DIAGNOSIS — J189 Pneumonia, unspecified organism: Secondary | ICD-10-CM | POA: Diagnosis not present

## 2024-03-10 DIAGNOSIS — Z85850 Personal history of malignant neoplasm of thyroid: Secondary | ICD-10-CM | POA: Diagnosis not present

## 2024-03-10 DIAGNOSIS — Z9981 Dependence on supplemental oxygen: Secondary | ICD-10-CM | POA: Diagnosis not present

## 2024-03-10 DIAGNOSIS — J440 Chronic obstructive pulmonary disease with acute lower respiratory infection: Secondary | ICD-10-CM | POA: Diagnosis not present

## 2024-03-10 DIAGNOSIS — M545 Low back pain, unspecified: Secondary | ICD-10-CM | POA: Diagnosis not present

## 2024-03-10 DIAGNOSIS — E1140 Type 2 diabetes mellitus with diabetic neuropathy, unspecified: Secondary | ICD-10-CM | POA: Diagnosis not present

## 2024-03-10 DIAGNOSIS — K7460 Unspecified cirrhosis of liver: Secondary | ICD-10-CM | POA: Diagnosis not present

## 2024-03-10 DIAGNOSIS — Z7984 Long term (current) use of oral hypoglycemic drugs: Secondary | ICD-10-CM | POA: Diagnosis not present

## 2024-03-10 DIAGNOSIS — Z794 Long term (current) use of insulin: Secondary | ICD-10-CM | POA: Diagnosis not present

## 2024-03-10 DIAGNOSIS — M353 Polymyalgia rheumatica: Secondary | ICD-10-CM | POA: Diagnosis not present

## 2024-03-10 DIAGNOSIS — Z7985 Long-term (current) use of injectable non-insulin antidiabetic drugs: Secondary | ICD-10-CM | POA: Diagnosis not present

## 2024-03-10 DIAGNOSIS — M797 Fibromyalgia: Secondary | ICD-10-CM | POA: Diagnosis not present

## 2024-03-10 DIAGNOSIS — J9621 Acute and chronic respiratory failure with hypoxia: Secondary | ICD-10-CM | POA: Diagnosis not present

## 2024-03-10 DIAGNOSIS — I10 Essential (primary) hypertension: Secondary | ICD-10-CM | POA: Diagnosis not present

## 2024-03-10 DIAGNOSIS — J9622 Acute and chronic respiratory failure with hypercapnia: Secondary | ICD-10-CM | POA: Diagnosis not present

## 2024-03-10 DIAGNOSIS — F32A Depression, unspecified: Secondary | ICD-10-CM | POA: Diagnosis not present

## 2024-03-11 DIAGNOSIS — J449 Chronic obstructive pulmonary disease, unspecified: Secondary | ICD-10-CM | POA: Diagnosis not present

## 2024-03-12 DIAGNOSIS — I10 Essential (primary) hypertension: Secondary | ICD-10-CM | POA: Diagnosis not present

## 2024-03-12 DIAGNOSIS — M353 Polymyalgia rheumatica: Secondary | ICD-10-CM | POA: Diagnosis not present

## 2024-03-12 DIAGNOSIS — J440 Chronic obstructive pulmonary disease with acute lower respiratory infection: Secondary | ICD-10-CM | POA: Diagnosis not present

## 2024-03-12 DIAGNOSIS — J9622 Acute and chronic respiratory failure with hypercapnia: Secondary | ICD-10-CM | POA: Diagnosis not present

## 2024-03-12 DIAGNOSIS — M545 Low back pain, unspecified: Secondary | ICD-10-CM | POA: Diagnosis not present

## 2024-03-12 DIAGNOSIS — F32A Depression, unspecified: Secondary | ICD-10-CM | POA: Diagnosis not present

## 2024-03-12 DIAGNOSIS — E1140 Type 2 diabetes mellitus with diabetic neuropathy, unspecified: Secondary | ICD-10-CM | POA: Diagnosis not present

## 2024-03-12 DIAGNOSIS — J189 Pneumonia, unspecified organism: Secondary | ICD-10-CM | POA: Diagnosis not present

## 2024-03-12 DIAGNOSIS — K7460 Unspecified cirrhosis of liver: Secondary | ICD-10-CM | POA: Diagnosis not present

## 2024-03-12 DIAGNOSIS — F419 Anxiety disorder, unspecified: Secondary | ICD-10-CM | POA: Diagnosis not present

## 2024-03-12 DIAGNOSIS — G8929 Other chronic pain: Secondary | ICD-10-CM | POA: Diagnosis not present

## 2024-03-12 DIAGNOSIS — J9621 Acute and chronic respiratory failure with hypoxia: Secondary | ICD-10-CM | POA: Diagnosis not present

## 2024-03-24 ENCOUNTER — Telehealth: Payer: Self-pay | Admitting: Neurology

## 2024-03-24 ENCOUNTER — Other Ambulatory Visit: Payer: Self-pay | Admitting: Internal Medicine

## 2024-03-24 ENCOUNTER — Ambulatory Visit: Admitting: Neurology

## 2024-03-24 NOTE — Telephone Encounter (Signed)
 Pt called to cancel appt today with Dr. Gregg  due to Transportation   Appt cancel

## 2024-03-25 ENCOUNTER — Other Ambulatory Visit: Payer: Self-pay | Admitting: Internal Medicine

## 2024-03-25 DIAGNOSIS — J440 Chronic obstructive pulmonary disease with acute lower respiratory infection: Secondary | ICD-10-CM | POA: Diagnosis not present

## 2024-03-25 DIAGNOSIS — Z7984 Long term (current) use of oral hypoglycemic drugs: Secondary | ICD-10-CM | POA: Diagnosis not present

## 2024-03-25 DIAGNOSIS — Z7985 Long-term (current) use of injectable non-insulin antidiabetic drugs: Secondary | ICD-10-CM | POA: Diagnosis not present

## 2024-03-25 DIAGNOSIS — F32A Depression, unspecified: Secondary | ICD-10-CM | POA: Diagnosis not present

## 2024-03-25 DIAGNOSIS — G8929 Other chronic pain: Secondary | ICD-10-CM | POA: Diagnosis not present

## 2024-03-25 DIAGNOSIS — J9621 Acute and chronic respiratory failure with hypoxia: Secondary | ICD-10-CM | POA: Diagnosis not present

## 2024-03-25 DIAGNOSIS — K7460 Unspecified cirrhosis of liver: Secondary | ICD-10-CM | POA: Diagnosis not present

## 2024-03-25 DIAGNOSIS — M353 Polymyalgia rheumatica: Secondary | ICD-10-CM | POA: Diagnosis not present

## 2024-03-25 DIAGNOSIS — Z85850 Personal history of malignant neoplasm of thyroid: Secondary | ICD-10-CM | POA: Diagnosis not present

## 2024-03-25 DIAGNOSIS — F419 Anxiety disorder, unspecified: Secondary | ICD-10-CM | POA: Diagnosis not present

## 2024-03-25 DIAGNOSIS — E039 Hypothyroidism, unspecified: Secondary | ICD-10-CM | POA: Diagnosis not present

## 2024-03-25 DIAGNOSIS — E785 Hyperlipidemia, unspecified: Secondary | ICD-10-CM | POA: Diagnosis not present

## 2024-03-25 DIAGNOSIS — E1140 Type 2 diabetes mellitus with diabetic neuropathy, unspecified: Secondary | ICD-10-CM | POA: Diagnosis not present

## 2024-03-25 DIAGNOSIS — M545 Low back pain, unspecified: Secondary | ICD-10-CM | POA: Diagnosis not present

## 2024-03-25 DIAGNOSIS — I10 Essential (primary) hypertension: Secondary | ICD-10-CM | POA: Diagnosis not present

## 2024-03-25 DIAGNOSIS — Z794 Long term (current) use of insulin: Secondary | ICD-10-CM | POA: Diagnosis not present

## 2024-03-25 DIAGNOSIS — J9622 Acute and chronic respiratory failure with hypercapnia: Secondary | ICD-10-CM | POA: Diagnosis not present

## 2024-03-25 DIAGNOSIS — J189 Pneumonia, unspecified organism: Secondary | ICD-10-CM | POA: Diagnosis not present

## 2024-03-25 DIAGNOSIS — M797 Fibromyalgia: Secondary | ICD-10-CM | POA: Diagnosis not present

## 2024-03-25 DIAGNOSIS — Z9981 Dependence on supplemental oxygen: Secondary | ICD-10-CM | POA: Diagnosis not present

## 2024-03-26 ENCOUNTER — Encounter: Payer: Self-pay | Admitting: Internal Medicine

## 2024-03-26 ENCOUNTER — Ambulatory Visit: Admitting: Internal Medicine

## 2024-03-26 ENCOUNTER — Other Ambulatory Visit: Payer: Self-pay | Admitting: Internal Medicine

## 2024-03-26 VITALS — BP 130/70 | HR 118 | Ht 67.0 in | Wt 292.8 lb

## 2024-03-26 DIAGNOSIS — E782 Mixed hyperlipidemia: Secondary | ICD-10-CM | POA: Diagnosis not present

## 2024-03-26 DIAGNOSIS — E1165 Type 2 diabetes mellitus with hyperglycemia: Secondary | ICD-10-CM | POA: Diagnosis not present

## 2024-03-26 DIAGNOSIS — E1159 Type 2 diabetes mellitus with other circulatory complications: Secondary | ICD-10-CM

## 2024-03-26 DIAGNOSIS — Z7985 Long-term (current) use of injectable non-insulin antidiabetic drugs: Secondary | ICD-10-CM

## 2024-03-26 DIAGNOSIS — E890 Postprocedural hypothyroidism: Secondary | ICD-10-CM

## 2024-03-26 DIAGNOSIS — Z7984 Long term (current) use of oral hypoglycemic drugs: Secondary | ICD-10-CM | POA: Diagnosis not present

## 2024-03-26 DIAGNOSIS — F1911 Other psychoactive substance abuse, in remission: Secondary | ICD-10-CM | POA: Diagnosis not present

## 2024-03-26 DIAGNOSIS — Z85850 Personal history of malignant neoplasm of thyroid: Secondary | ICD-10-CM

## 2024-03-26 DIAGNOSIS — Z794 Long term (current) use of insulin: Secondary | ICD-10-CM

## 2024-03-26 DIAGNOSIS — F418 Other specified anxiety disorders: Secondary | ICD-10-CM | POA: Diagnosis not present

## 2024-03-26 DIAGNOSIS — F4323 Adjustment disorder with mixed anxiety and depressed mood: Secondary | ICD-10-CM | POA: Diagnosis not present

## 2024-03-26 DIAGNOSIS — E1169 Type 2 diabetes mellitus with other specified complication: Secondary | ICD-10-CM | POA: Diagnosis not present

## 2024-03-26 DIAGNOSIS — F332 Major depressive disorder, recurrent severe without psychotic features: Secondary | ICD-10-CM | POA: Diagnosis not present

## 2024-03-26 LAB — POCT GLYCOSYLATED HEMOGLOBIN (HGB A1C): Hemoglobin A1C: 8.3 % — AB (ref 4.0–5.6)

## 2024-03-26 MED ORDER — LYUMJEV KWIKPEN 200 UNIT/ML ~~LOC~~ SOPN: 25.0000 [IU] | PEN_INJECTOR | Freq: Three times a day (TID) | SUBCUTANEOUS | 3 refills | Status: DC

## 2024-03-26 MED ORDER — OZEMPIC (2 MG/DOSE) 8 MG/3ML ~~LOC~~ SOPN: 2.0000 mg | PEN_INJECTOR | SUBCUTANEOUS | 3 refills | Status: AC

## 2024-03-26 NOTE — Progress Notes (Addendum)
 Patient ID: Mary Acosta, female   DOB: Sep 06, 1955, 68 y.o.   MRN: [id number]   HPI: Mary Acosta is a 68 y.o.-year-old female, initially referred by her PCP, Dr. Geofm, returning for follow-up for DM2, dx in 2000, insulin -dependent since 2011, uncontrolled, with long-term complications (peripheral neuropathy, gastroparesis, cerebrovascular disease with history of TIA 2012, DKA 2015) and also history of thyroid  cancer and postsurgical hypothyroidism.  She previously saw my colleague, Dr. Kassie, up to 07/2019.  Last visit with me 4 months ago.  Interim hx: She denies nausea, abdominal pain, chest pain, shortness of breath.  She previously came off Ozempic  due to high lipase but before last visit she restarted it after having had an abdominal CT that showed to the pancreas appears normal.  She tolerates it well. She was admitted with acute respiratory failure in 01/2024.  She feels better. She mentions she has insomnia, which is chronic for her.  DM2: Reviewed HbA1c levels: Lab Results  Component Value Date   HGBA1C 11.5 (A) 11/19/2023   HGBA1C 13.0 (H) 05/27/2023   HGBA1C 12.1 (A) 12/24/2022   HGBA1C 10.6 (A) 08/08/2022   HGBA1C 11.9 (A) 05/10/2022   HGBA1C 8.1 (A) 10/09/2021   HGBA1C 8.6 (A) 06/04/2021   HGBA1C 6.9 (A) 01/15/2021   HGBA1C 7.6 (A) 10/13/2020   HGBA1C 8.1 (A) 07/05/2020   She was previously on: - Basaglar  80 units at bedtime -Lilly Cares >> stopped >> once a week >> 50 >> 80 >> 160 units daily >> 80 units twice a day  - Metformin  1000 mg with dinner >> 500 mg 2x a day - Ozempic  0.5 >> 1 mg weekly - - Lyumjev  10-20 >> 20-25 units before meals >> muscle cramps >> decreased: 14 units Prev. On Rybelsus  3 mg before breakfast-see patient assistance pgm  She was on N and R insulin  in the past. She was on metformin  in the past >> did not work. She was prev. On Toujeo  180 units daily.  At last visit she was on: - Metformin  500 mg 2x a day - Ozempic  1 mg  weekly in a.m. >> stopped  2/2 high lipase >> restarted  - Tresiba  80 units daily  - started 1 week ago, prev. Basaglar  - U500 - 30 min before the meal: >> 50 units before B'fast Intermittently taking 40 units of Lyumjev  insulin   I advised her to use: - Ozempic  1 mg weekly  - Toujeo /Tresiba  80 units daily - Lyumjev :  actually using 0-30 units based on an old sliding scale. You may need to take 10-15 units of Lyumjev  insulin  only for correction >> uses 10-20 units  Pt checks her sugars 4x  times a day, prev. w daily/ CGM (with receiver):  Prev.: - am: 400-550 >> 150-260 - 2h after b'fast: n/c - lunch: 250-400 >> 150-260 - 2h after lunch: n/c - dinner: 350 >> 260 - 2h after dinner: 450-500 >> n/c - bedtime: n/c  Previously:   Lowest sugar was 226 >> 250 >> 40s >> 51; she has hypoglycemia awareness in the 70s. Highest sugar was 500s >> 561 >> Hi >> HI  Glucometer: AccuChek, Prodigy  Pt's meals are: - Breakfast: bacon or sausage and eggs, cereal >> grits, or grilled cheese - Lunch: salad; hot dog, sandwiches >> ham and cheese sandwich or skips - Dinner: meat + veggies  >> larger - Snacks: not so much anymore Previously drinking Kool-Aid, now stopped.  However, she is still having that she is  drinking High C juice.   She is status post gastric banding with vagotomy in 2008.  She lost 168 pounds afterwards but then gained more than 100 pounds back.  No h/o pancreatitis or FH of MTC.  However, she had a high lipase in 05/2023.   No CKD, last BUN/creatinine:  Lab Results  Component Value Date   BUN 20 02/16/2024   BUN 18 02/08/2024   CREATININE 1.29 (H) 02/16/2024   CREATININE 0.87 02/08/2024   No results found for: MICRALBCREAT Not on ACE inhibitor/ARB.  + HL; last set of lipids: Lab Results  Component Value Date   CHOL 163 05/27/2023   HDL 32.30 (L) 05/27/2023   LDLCALC 88 05/27/2023   TRIG 178 (H) 02/03/2024   CHOLHDL 5 05/27/2023  On Zocor  20, Zetia  10.  -  last eye exam was on 12/01/2023: no DR, hypertensive retinopathy, bilateral macular degeneration.   -She has pain, numbness, tingling in her feet.  Last foot exam 11/19/2023.  On ASA 81.  Pt has FH of DM in mother.  Thyroid  cancer:  Reviewed her thyroid  cancer history: She is status post total thyroidectomy - [place] Sx 2007. 11/22/2005: THYROID , THYROIDECTOMY:   - PAPILLARY THYROID  CARCINOMA (0.5 CM).   - ADENOMATOID NODULES.    COMMENT   ONCOLOGY TABLE-THYROID     1. Maximum tumor size (cm): 0.5 cm   2. Tumor location: Right mid lobe   3. Multifocality: Not identified   4. Histology: Papillary thyroid  carcinoma   5. Margins: Negative   6. Capsular invasion: Present   7. Extrathyroidal extension: Not identified   8. Vascular/Lymphatic invasion: Not identified   9. Lymph nodes: N/A   10. TNM code: pT1, pNX, pMX   11. Comments: Examination of the thyroidectomy specimen   grossly demonstrates multiple nodules. Histologic evaluation   demonstrates numerous adenomatoid nodules. One nodule measuring   0.5 cm demonstrates features compatible with papillary thyroid    carcinoma. The surgical margins are negative. (MS:jy) 11/25/05   Neck U/S (10/25/2020): no abnormal neck masses  Postsurgical hypothyroidism:  She takes 175 mcg levothyroxine  daily (dose increased 11/2022): - no missed doses anymore - in am - fasting - at least 30 min from b'fast - no calcium  - no iron - no multivitamins - no PPIs - not on Biotin  Reviewed her TFTs: Lab Results  Component Value Date   TSH 0.111 (L) 02/03/2024   TSH 3.01 05/27/2023   TSH 10.66 (H) 12/24/2022   TSH 4.62 05/10/2022   TSH 0.13 (L) 10/09/2021   TSH 0.51 02/02/2021   TSH 0.14 (L) 08/04/2020   TSH 4.42 05/03/2020   TSH 1.50 10/22/2019   TSH 1.09 07/20/2019   TSH 1.32 04/01/2019   TSH 0.55 09/04/2018   TSH 0.90 12/26/2017   TSH 0.113 (L) 12/08/2017   TSH 0.22 (L) 09/19/2017   Pt denies: - feeling nodules in  neck - hoarseness - dysphagia - choking  She also has a history of COPD, OSA, fibromyalgia, depression, fatty liver-cirrhosis, DDD, h/o vb fx, PMR. She is seen in pain clinic in [facility].  She is on Neurontin  and amitriptyline . She lost 2 sons: 2020 and 09/2020.    ROS: + see HPI   I reviewed pt's medications, allergies, PMH, social hx, family hx, and changes were documented in the history of present illness. Otherwise, unchanged from my initial visit note.   Past Medical History:  Diagnosis Date   AKI (acute kidney injury) (HCC) 01/2017   Allergy  seasonal   Anxiety    with panic attacks   Arthritis    back; feet; hands; shoulders (08/26/2014)   Asthma    Cataract    forming   Cervical cancer (HCC)    Chronic lower back pain    Chronic narcotic use    Chronic pain syndrome    [facility]   Cirrhosis (HCC)    Clostridium difficile infection 2017   COPD (chronic obstructive pulmonary disease) (HCC)    Daily headache    past hx   Depression    Diabetic neuropathy (HCC) 06/04/2017   feet  and legs , some in hands   DJD (degenerative joint disease)    Fatty liver disease, nonalcoholic    Fibromyalgia    Frequency of urination    HCAP (healthcare-associated pneumonia) 08/26/2014   History of TIA (transient ischemic attack) 11/01/2010   NO RESIDUAL   Hyperlipidemia    Hypertension    Hypothyroidism    IDDM (insulin  dependent diabetes mellitus)    Insomnia    Lumbar stenosis    Macular degeneration    Memory difficulty 07/25/2016   Nocturia    OSA (obstructive sleep apnea)    NO CPAP SINCE WT LOSS   Osteoarthritis    with severe disease in knee   Oxygen  deficiency    4  liters at bedtime only   Pneumonia several times   Polymyalgia rheumatica (HCC)    Scoliosis    Seasonal allergies    Stroke [facility])    TIA   Thyroid  cancer (HCC)    Urgency of urination    Vaginal pain S/P SLING  FEB 2012    Past Surgical History:  Procedure  Laterality Date   APPENDECTOMY  1982   BREAST EXCISIONAL BIOPSY Left 02/28/2005   Atypical Ductal Hyperplasia   CARDIAC CATHETERIZATION  09/04/2004   NORMAL CORONARY ANATOMY/ NORMAL LVF/ EF 60%   CARDIOVASCULAR STRESS TEST  12-27-2010  DR SWAZILAND   ABNORMAL NUCLEAR STUDY W/ /MILD INFERIOR ISCHEMIA/ EF 69%/  CT HEART ANGIOGRAM ;  NO ACUTE FINDINGS   COLONOSCOPY     CRYOABLATION  05/16/2003   w/LEEP FOR ABNORMAL PAP SMEAR   CYSTOSCOPY  05/18/2012   Procedure: CYSTOSCOPY;  Surgeon: Glendia DELENA Elizabeth, MD;  Location: [facility];  Service: Urology;  Laterality: N/A;  examination under anethesia   ESOPHAGOGASTRODUODENOSCOPY (EGD) WITH PROPOFOL  N/A 09/03/2016   Procedure: ESOPHAGOGASTRODUODENOSCOPY (EGD) WITH PROPOFOL ;  Surgeon: Victory LITTIE Legrand DOUGLAS, MD;  Location: [facility];  Service: Gastroenterology;  Laterality: N/A;   ESOPHAGOGASTRODUODENOSCOPY (EGD) WITH PROPOFOL  N/A 11/19/2021   Procedure: ESOPHAGOGASTRODUODENOSCOPY (EGD) WITH PROPOFOL ;  Surgeon: Legrand Victory LITTIE DOUGLAS, MD;  Location: [facility];  Service: Gastroenterology;  Laterality: N/A;  Varices screeing, cirrhosis   HYSTEROSCOPY WITH D & C  08/19/2007   PMB   KNEE ARTHROSCOPY W/ DEBRIDEMENT Left 03/29/2006   INTERNAL DERANGEMENT/ SEVERE DJD/ MENISCUS TEARS   LAPAROSCOPIC CHOLECYSTECTOMY  06/10/2002   LAPAROSCOPIC GASTRIC BANDING  03/01/2006   TRUNCAL VAGOTOMY/ PLACEMENT OF VG BAND   REVISION TOTAL KNEE ARTHROPLASTY Left 08-29-2008; 05/2009   TONSILLECTOMY  1969   TOTAL KNEE ARTHROPLASTY Left 01/23/2007   SEVERE DJD   TOTAL THYROIDECTOMY  11/22/2005   BILATERAL THYROID  NODULES-- PAPILLARY CARCINOMA (0.5CM)/ ADENOMATOID NODULES   TRANSTHORACIC ECHOCARDIOGRAM  12/27/2010   LVSF NORMAL / EF 55-65%/ GRADE I DIASTOLIC DYSFUNCTION/ MILD MITRAL REGURG. / MILDLY DILATED LEFT ATRIUM/ MILDY INCREASED SYSTOLIC PRESSURE OF PULMONARY ARTERIES   TRANSVAGINAL  SUBURETERAL TAPE/ SLING  09/28/2010   MIXED URINARY INCONTINENCE   TUBAL  LIGATION  1983    Social History   Socioeconomic History   Marital status: Married    Spouse name: Gaile   Number of children: 2   Years of education: 12   Highest education level: Not on file  Occupational History   Occupation: disabled    Employer: UNEMPLOYED  Tobacco Use   Smoking status: Former    Current packs/day: 0.00    Average packs/day: 2.5 packs/day for 39.0 years (97.5 ttl pk-yrs)    Types: Cigarettes    Start date: 10/23/1971    Quit date: 10/22/2010    Years since quitting: 13.4   Smokeless tobacco: Never  Vaping Use   Vaping status: Never Used  Substance and Sexual Activity   Alcohol use: No    Alcohol/week: 0.0 standard drinks of alcohol   Drug use: No   Sexual activity: Not Currently  Other Topics Concern   Not on file  Social History Narrative   Lives at home w/ her husband    Right-handed   Caffeine: 1 cup of coffee per week + Pepsi      Lives with her husband and nephew and his wife.   Social Drivers of Corporate investment banker Strain: Low Risk  (12/23/2023)   Overall Financial Resource Strain (CARDIA)    Difficulty of Paying Living Expenses: Not very hard  Food Insecurity: No Food Insecurity (02/10/2024)   Hunger Vital Sign    Worried About Running Out of Food in the Last Year: Never true    Ran Out of Food in the Last Year: Never true  Transportation Needs: No Transportation Needs (02/10/2024)   PRAPARE - Administrator, Civil Service (Medical): No    Lack of Transportation (Non-Medical): No  Physical Activity: Inactive (12/23/2023)   Exercise Vital Sign    Days of Exercise per Week: 0 days    Minutes of Exercise per Session: 0 min  Stress: Stress Concern Present (12/23/2023)   [employer] of Occupational Health - Occupational Stress Questionnaire    Feeling of Stress : Very much  Social Connections: Moderately Isolated (12/23/2023)   Social Connection and Isolation Panel    Frequency of Communication with Friends and  Family: More than three times a week    Frequency of Social Gatherings with Friends and Family: Twice a week    Attends Religious Services: Never    Database administrator or Organizations: No    Attends Banker Meetings: Never    Marital Status: Married  Catering manager Violence: Not At Risk (02/10/2024)   Humiliation, Afraid, Rape, and Kick questionnaire    Fear of Current or Ex-Partner: No    Emotionally Abused: No    Physically Abused: No    Sexually Abused: No    Current Outpatient Medications on File Prior to Visit  Medication Sig Dispense Refill   albuterol  (VENTOLIN  HFA) 108 (90 Base) MCG/ACT inhaler INHALE TWO (2) PUFFS BY MOUTH EVERY 6 HOURS AS NEEDED FOR SHORTNESS OF BREATH AND WHEEZING 18 g 10   amLODipine  (NORVASC ) 10 MG tablet Take 1 tablet (10 mg total) by mouth daily. 30 tablet 0   atomoxetine (STRATTERA) 40 MG capsule Take 40 mg by mouth every morning.     baclofen (LIORESAL) 10 MG tablet Take 10 mg by mouth 2 (two) times daily. 02/10/24: Reports during TOC call takes prn     Buprenorphine HCl (  BELBUCA) 300 MCG FILM Place inside cheek.     carvedilol  (COREG ) 25 MG tablet Take 1 tablet (25 mg total) by mouth 2 (two) times daily with a meal. 60 tablet 0   colchicine  0.6 MG tablet Take 1 tablet (0.6 mg total) by mouth daily. 30 tablet 5   Continuous Blood Gluc Sensor (FREESTYLE LIBRE 3 SENSOR) MISC 1 each by Does not apply route every 14 (fourteen) days. 6 each 3   Continuous Glucose Sensor (DEXCOM G7 SENSOR) MISC 3 each by Does not apply route every 30 (thirty) days. Apply 1 sensor every 10 days 9 each 4   ergocalciferol  (VITAMIN D2) 1.25 MG (50000 UT) capsule Take 1 capsule (50,000 Units total) by mouth once a week. Monday 12 capsule 3   fexofenadine (ALLEGRA) 180 MG tablet Take 180 mg by mouth daily as needed for allergies or rhinitis.     folic acid (FOLVITE) 1 MG tablet Take 1 mg by mouth daily.     furosemide  (LASIX ) 40 MG tablet TAKE 1 TABLET BY MOUTH  EACH MORNING 30 tablet 10   gabapentin  (NEURONTIN ) 300 MG capsule Take 300 mg by mouth 2 (two) times daily.     hydrochlorothiazide  (HYDRODIURIL ) 25 MG tablet Take 1 tablet (25 mg total) by mouth daily. 30 tablet 0   insulin  glargine, 1 Unit Dial, (TOUJEO  SOLOSTAR) 300 UNIT/ML Solostar Pen Inject 25 Units into the skin 2 (two) times daily.     Insulin  Lispro-aabc (LYUMJEV  KWIKPEN) 200 UNIT/ML KwikPen Inject 10 Units into the skin 3 (three) times daily. 02/10/24: Reports during TOC call endocrinology provider prescribed this with sliding scale to take with meals     Insulin  Pen Needle (EMBECTA PEN NEEDLE NANO 2 GEN) 32G X 4 MM MISC USE AS DIRECTED 5 TIMES A DAY 200 each 9   Insulin  Syringe-Needle U-100 26G X 1/2 1 ML MISC Use daily for insulin  injection as directed 100 each 3   Ipratropium-Albuterol  (COMBIVENT  RESPIMAT) 20-100 MCG/ACT AERS respimat Inhale 1 puff into the lungs every 6 (six) hours as needed for wheezing. 4 g 5   ipratropium-albuterol  (DUONEB) 0.5-2.5 (3) MG/3ML SOLN Take 3 mLs by nebulization every 6 (six) hours as needed (Wheezing or dyspnea.). 360 mL 11   levothyroxine  (SYNTHROID ) 175 MCG tablet TAKE 1 TABLET BY MOUTH ONCE DAILY BEFORE BREAKFAST 30 tablet 10   metFORMIN  (GLUCOPHAGE ) 500 MG tablet TAKE 1 TABLET BY MOUTH EACH MORNING AND TAKE 1 TABLET EVERY DAY AT BEDTIME 180 tablet 3   naloxone  (NARCAN ) nasal spray 4 mg/0.1 mL One spray in either nostril once for known/suspected opioid overdose. May repeat every 2-3 minutes in alternating nostril til EMS arrives     PARoxetine  (PAXIL ) 20 MG tablet Take 20 mg by mouth daily.     PARoxetine  (PAXIL ) 40 MG tablet Take 1 tablet (40 mg total) by mouth daily. 90 tablet 1   promethazine  (PHENERGAN ) 25 MG tablet TAKE 1 TABLET BY MOUTH EVERY 6 HOURS AS NEEDED FOR NAUSEA OR VOMITING. 60 tablet 1   Propylene Glycol (SYSTANE BALANCE) 0.6 % SOLN Place 1 drop into both eyes daily as needed (dry eyes).     Semaglutide , 1 MG/DOSE, (OZEMPIC , 1  MG/DOSE,) 4 MG/3ML SOPN INJECT 1MG  INTO THE SKIN ONCE A WEEK 9 mL 3   simvastatin  (ZOCOR ) 20 MG tablet TAKE 1 TABLET BY MOUTH EVERY DAY AT BEDTIME 90 tablet 1   TRESIBA  FLEXTOUCH 200 UNIT/ML FlexTouch Pen SMARTSIG:80 Unit(s) SUB-Q Daily     No current facility-administered  medications on file prior to visit.    Allergies  Allergen Reactions   Gabapentin  Swelling    Swelling in legs    Losartan Other (See Comments)    Myalgias and muscle cramping    Aricept  [Donepezil  Hcl]     Nausea/vomiting, low BP   Aripiprazole Other (See Comments)    Patient reports Hypoglycemia   Hydroxyzine      hallucinations   Oxycodone-Acetaminophen  Itching   Oxycodone Itching   Sulfa Antibiotics Nausea Only and Rash   Sulfonamide Derivatives Nausea Only and Rash    Family History  Problem Relation Age of Onset   Colon polyps Mother    Diabetes Mother    Heart disease Mother    Dementia Mother    Heart disease Father    High blood pressure Father    Colon cancer Maternal Uncle        x 3   Stomach cancer Paternal Uncle    Breast cancer Other        great aunts x 5   Esophageal cancer Neg Hx    Rectal cancer Neg Hx    PE: BP 130/70   Pulse (!) 118   Ht 5' 7 (1.702 m)   Wt 292 lb 12.8 oz (132.8 kg)   SpO2 93%   BMI 45.86 kg/m  Wt Readings from Last 15 Encounters:  03/26/24 292 lb 12.8 oz (132.8 kg)  02/16/24 292 lb (132.5 kg)  02/08/24 296 lb 4.8 oz (134.4 kg)  01/13/24 300 lb (136.1 kg)  12/23/23 300 lb (136.1 kg)  12/04/23 300 lb (136.1 kg)  11/19/23 (!) 316 lb 3.2 oz (143.4 kg)  10/10/23 (!) 308 lb 4 oz (139.8 kg)  06/09/23 292 lb 9.6 oz (132.7 kg)  02/28/23 (!) 317 lb (143.8 kg)  02/27/23 (!) 320 lb (145.2 kg)  02/26/23 (!) 322 lb 9.6 oz (146.3 kg)  12/24/22 (!) 302 lb 6.4 oz (137.2 kg)  10/15/22 (!) 303 lb 9.6 oz (137.7 kg)  08/08/22 (!) 310 lb (140.6 kg)   Constitutional: overweight, in NAD, walks with a walker Eyes:  EOMI, no exophthalmos ENT: no neck masses, no  cervical lymphadenopathy Cardiovascular: Tachycardia, RR, No MRG Respiratory: CTA B Musculoskeletal: no deformities Skin: + rash on shins Neurological: no tremor with outstretched hands   ASSESSMENT: 1. DM2, insulin -dependent, uncontrolled, with complications - cerebrovascular disease with history of TIA 2012 - peripheral neuropathy - gastroparesis - DKA 2015   2.  History of thyroid  cancer   3.  Postsurgical hypothyroidism   PLAN:  1. Patient with longstanding, uncontrolled, type 2 diabetes, on weekly GLP-1 receptor agonist and basal/bolus insulin  regimen, with still poor control.  She was previously off Ozempic  after a lipase returned elevated, but she restarted Ozempic  by herself after pancreas appeared to be normal on imaging.  She now tolerates it well so we did not stop it. - At last visit, she was off her sensor and HbA1c was very high, at 11.5%.  She was previously on U-500 insulin  but she felt that this was dropping her blood sugars too low and started to take Lyumjev .  I advised her to increase the doses at last visit due to the very high blood sugars. - Since last visit, she was able to restart a CGM.  She noticed much better blood sugars before her admission for acute respiratory failure in 01/2024.  In the hospital, she felt that she was not given enough insulin  and her sugars started to increase, and her  diabetes control did not recover completely since then CGM interpretation: -At today's visit, we reviewed her CGM downloads: It appears that 23% of values are in target range (goal >70%), while 77% are higher than 180 (goal <25%), and 0% are lower than 70 (goal <4%).  The calculated average blood sugar is 238.  The projected HbA1c for the next 3 months (GMI) is 9.0%. -Reviewing the CGM trends, sugars are elevated throughout the day.  Upon questioning, she is using much lower doses of Lyumjev  than recommended.  At today's visit, I advised her to increase these slightly, and also  use a higher dose of basal insulin .  I advised her not to inject in scar tissue, and to rotate the injection sites.  She is wondering whether she could increase the dose of Ozempic .  Since she tolerates this well, we decided to increase the dose to 2 mg weekly.  I advised her to stop this immediately if she develops abdominal pain or increased nausea. - I suggested to:  Patient Instructions  Please continue: - Metformin  500 mg 2x a day  Increase: - Tresiba /Toujeo  50 units x2 daily - Lyumjev : 25 units before a smaller meal 30 units before a regular meal 35 units before a large meal You may need to take 10-20 units of Lyumjev  insulin  only for correction. - Ozempic  2 mg weekly    Please continue Levothyroxine  175 mcg daily.   Take the thyroid  hormone every day, with water , at least 30 minutes before breakfast, separated by at least 4 hours from: - acid reflux medications - calcium  - iron - multivitamins   Please stop at the lab.  Please return in 3 months.     - we checked her HbA1c: 8.3% (much better) - advised to check sugars at different times of the day - 4x a day, rotating check times - advised for yearly eye exams >> she is UTD - return to clinic in 2-3 months   2.  History of thyroid  cancer -She had a papillary thyroid  cancer focus in the right thyroid  lobe, measuring 0.5 cm.  No vascular, lymphatic or direct extension into the surrounding tissues - No neck compression symptoms - Neck ultrasound from 10/2020 showed no suspicious masses - Will continue to follow her clinically for now   3.  Postsurgical hypothyroidism - latest thyroid  labs reviewed with pt. >> TSH was suppressed: Lab Results  Component Value Date   TSH 0.111 (L) 02/03/2024  - she continues on LT4 175 mcg daily - we discussed about taking the thyroid  hormone every day, with water , >30 minutes before breakfast, separated by >4 hours from acid reflux medications, calcium , iron, multivitamins. Pt. is taking  it correctly. - will check thyroid  tests today: TSH and fT4 and we may need to reduce the dose of LT4 afterwards. - If labs are abnormal, she will need to return for repeat TFTs in 1.5 months  Orders Placed This Encounter  Procedures   TSH   T4, free   Microalbumin / creatinine urine ratio   POCT glycosylated hemoglobin (Hb A1C)    Component     Latest Ref Rng 03/26/2024  Hemoglobin A1C     4.0 - 5.6 % 8.3 !   TSH     0.40 - 4.50 mIU/L 2.47   T4,Free(Direct)     0.8 - 1.8 ng/dL 1.2   Creatinine, Urine     20 - 275 mg/dL 867   Microalb, Ur     mg/dL 0.7  MICROALB/CREAT RATIO     <30 mg/g creat 5     TFTs and ACR normal.  Lela Fendt, MD PhD [facility]

## 2024-03-26 NOTE — Patient Instructions (Addendum)
 Please continue: - Metformin  500 mg 2x a day  Increase: - Tresiba /Toujeo  50 units x2 daily - Lyumjev : 25 units before a smaller meal 30 units before a regular meal 35 units before a large meal You may need to take 10-20 units of Lyumjev  insulin  only for correction. - Ozempic  2 mg weekly    Please continue Levothyroxine  175 mcg daily.   Take the thyroid  hormone every day, with water , at least 30 minutes before breakfast, separated by at least 4 hours from: - acid reflux medications - calcium  - iron - multivitamins   Please stop at the lab.  Please return in 3 months.

## 2024-03-27 LAB — MICROALBUMIN / CREATININE URINE RATIO
Creatinine, Urine: 132 mg/dL (ref 20–275)
Microalb Creat Ratio: 5 mg/g{creat} (ref <30)
Microalb, Ur: 0.7 mg/dL

## 2024-03-27 LAB — TSH: TSH: 2.47 m[IU]/L (ref 0.40–4.50)

## 2024-03-27 LAB — T4, FREE: Free T4: 1.2 ng/dL (ref 0.8–1.8)

## 2024-03-29 ENCOUNTER — Ambulatory Visit: Payer: Self-pay | Admitting: Internal Medicine

## 2024-03-29 DIAGNOSIS — Z7984 Long term (current) use of oral hypoglycemic drugs: Secondary | ICD-10-CM | POA: Diagnosis not present

## 2024-03-29 DIAGNOSIS — G894 Chronic pain syndrome: Secondary | ICD-10-CM | POA: Diagnosis not present

## 2024-03-29 DIAGNOSIS — E1142 Type 2 diabetes mellitus with diabetic polyneuropathy: Secondary | ICD-10-CM | POA: Diagnosis not present

## 2024-03-29 DIAGNOSIS — M25562 Pain in left knee: Secondary | ICD-10-CM | POA: Diagnosis not present

## 2024-03-29 DIAGNOSIS — M549 Dorsalgia, unspecified: Secondary | ICD-10-CM | POA: Diagnosis not present

## 2024-03-29 DIAGNOSIS — Z885 Allergy status to narcotic agent status: Secondary | ICD-10-CM | POA: Diagnosis not present

## 2024-03-29 DIAGNOSIS — Z7985 Long-term (current) use of injectable non-insulin antidiabetic drugs: Secondary | ICD-10-CM | POA: Diagnosis not present

## 2024-03-29 DIAGNOSIS — Z79899 Other long term (current) drug therapy: Secondary | ICD-10-CM | POA: Diagnosis not present

## 2024-03-29 DIAGNOSIS — Z79890 Hormone replacement therapy (postmenopausal): Secondary | ICD-10-CM | POA: Diagnosis not present

## 2024-03-29 DIAGNOSIS — M353 Polymyalgia rheumatica: Secondary | ICD-10-CM | POA: Diagnosis not present

## 2024-03-29 DIAGNOSIS — Z8709 Personal history of other diseases of the respiratory system: Secondary | ICD-10-CM | POA: Diagnosis not present

## 2024-03-29 DIAGNOSIS — Z882 Allergy status to sulfonamides status: Secondary | ICD-10-CM | POA: Diagnosis not present

## 2024-03-29 DIAGNOSIS — J449 Chronic obstructive pulmonary disease, unspecified: Secondary | ICD-10-CM | POA: Diagnosis not present

## 2024-03-29 DIAGNOSIS — Z794 Long term (current) use of insulin: Secondary | ICD-10-CM | POA: Diagnosis not present

## 2024-03-29 DIAGNOSIS — M533 Sacrococcygeal disorders, not elsewhere classified: Secondary | ICD-10-CM | POA: Diagnosis not present

## 2024-03-29 DIAGNOSIS — Z09 Encounter for follow-up examination after completed treatment for conditions other than malignant neoplasm: Secondary | ICD-10-CM | POA: Diagnosis not present

## 2024-03-29 DIAGNOSIS — M25561 Pain in right knee: Secondary | ICD-10-CM | POA: Diagnosis not present

## 2024-03-30 ENCOUNTER — Telehealth: Payer: Self-pay

## 2024-03-30 ENCOUNTER — Other Ambulatory Visit (HOSPITAL_COMMUNITY): Payer: Self-pay

## 2024-03-30 ENCOUNTER — Telehealth: Payer: Self-pay | Admitting: Pharmacy Technician

## 2024-03-30 MED ORDER — FIASP FLEXTOUCH 100 UNIT/ML ~~LOC~~ SOPN: 25.0000 [IU] | PEN_INJECTOR | Freq: Three times a day (TID) | SUBCUTANEOUS | 3 refills | Status: DC

## 2024-03-30 NOTE — Telephone Encounter (Signed)
 PA request has been Received. New Encounter has been or will be created for follow up. For additional info see Pharmacy Prior Auth telephone encounter from 03/30/24.

## 2024-03-30 NOTE — Telephone Encounter (Signed)
 Requested Prescriptions   Signed Prescriptions Disp Refills   insulin  aspart (FIASP  FLEXTOUCH) 100 UNIT/ML FlexTouch Pen 105 mL 3    Sig: Inject 25-35 Units into the skin 3 (three) times daily before meals.    Authorizing Provider: TRIXIE FILE    Ordering User: CLEOTILDE ROLIN RAMAN

## 2024-03-30 NOTE — Telephone Encounter (Signed)
 Pt needs PA for Lyumjev  [id number]

## 2024-03-30 NOTE — Telephone Encounter (Signed)
 Pharmacy Patient Advocate Encounter   Received notification from Pt Calls Messages that prior authorization for Lyumjeb is required/requested.   Insurance verification completed.   The patient is insured through [employer] .   Per test claim:  Novo insulin  is preferred by the insurance.  If suggested medication is appropriate, Please send in a new RX and discontinue this one. If not, please advise as to why it's not appropriate so that we may request a Prior Authorization. Please note, some preferred medications may still require a PA.  If the suggested medications have not been trialed and there are no contraindications to their use, the PA will not be submitted, as it will not be approved. Novolog  or Fiasp  would be a $0 copay.

## 2024-03-30 NOTE — Telephone Encounter (Signed)
 Please see below.

## 2024-03-31 MED ORDER — FIASP FLEXTOUCH 100 UNIT/ML ~~LOC~~ SOPN: 25.0000 [IU] | PEN_INJECTOR | Freq: Three times a day (TID) | SUBCUTANEOUS | 3 refills | Status: AC

## 2024-03-31 NOTE — Telephone Encounter (Signed)
 Pt has been notified and rx has been sent

## 2024-04-01 ENCOUNTER — Other Ambulatory Visit: Payer: Self-pay | Admitting: Internal Medicine

## 2024-04-06 DIAGNOSIS — M545 Low back pain, unspecified: Secondary | ICD-10-CM | POA: Diagnosis not present

## 2024-04-06 DIAGNOSIS — E785 Hyperlipidemia, unspecified: Secondary | ICD-10-CM | POA: Diagnosis not present

## 2024-04-06 DIAGNOSIS — F419 Anxiety disorder, unspecified: Secondary | ICD-10-CM | POA: Diagnosis not present

## 2024-04-06 DIAGNOSIS — F32A Depression, unspecified: Secondary | ICD-10-CM | POA: Diagnosis not present

## 2024-04-06 DIAGNOSIS — G8929 Other chronic pain: Secondary | ICD-10-CM | POA: Diagnosis not present

## 2024-04-06 DIAGNOSIS — Z794 Long term (current) use of insulin: Secondary | ICD-10-CM | POA: Diagnosis not present

## 2024-04-06 DIAGNOSIS — Z9981 Dependence on supplemental oxygen: Secondary | ICD-10-CM | POA: Diagnosis not present

## 2024-04-06 DIAGNOSIS — J440 Chronic obstructive pulmonary disease with acute lower respiratory infection: Secondary | ICD-10-CM | POA: Diagnosis not present

## 2024-04-06 DIAGNOSIS — I10 Essential (primary) hypertension: Secondary | ICD-10-CM | POA: Diagnosis not present

## 2024-04-06 DIAGNOSIS — J189 Pneumonia, unspecified organism: Secondary | ICD-10-CM | POA: Diagnosis not present

## 2024-04-06 DIAGNOSIS — Z7984 Long term (current) use of oral hypoglycemic drugs: Secondary | ICD-10-CM | POA: Diagnosis not present

## 2024-04-06 DIAGNOSIS — J9621 Acute and chronic respiratory failure with hypoxia: Secondary | ICD-10-CM | POA: Diagnosis not present

## 2024-04-06 DIAGNOSIS — M797 Fibromyalgia: Secondary | ICD-10-CM | POA: Diagnosis not present

## 2024-04-06 DIAGNOSIS — M353 Polymyalgia rheumatica: Secondary | ICD-10-CM | POA: Diagnosis not present

## 2024-04-06 DIAGNOSIS — J9622 Acute and chronic respiratory failure with hypercapnia: Secondary | ICD-10-CM | POA: Diagnosis not present

## 2024-04-06 DIAGNOSIS — Z7985 Long-term (current) use of injectable non-insulin antidiabetic drugs: Secondary | ICD-10-CM | POA: Diagnosis not present

## 2024-04-06 DIAGNOSIS — E039 Hypothyroidism, unspecified: Secondary | ICD-10-CM | POA: Diagnosis not present

## 2024-04-06 DIAGNOSIS — K7460 Unspecified cirrhosis of liver: Secondary | ICD-10-CM | POA: Diagnosis not present

## 2024-04-06 DIAGNOSIS — Z85850 Personal history of malignant neoplasm of thyroid: Secondary | ICD-10-CM | POA: Diagnosis not present

## 2024-04-06 DIAGNOSIS — E1140 Type 2 diabetes mellitus with diabetic neuropathy, unspecified: Secondary | ICD-10-CM | POA: Diagnosis not present

## 2024-04-11 DIAGNOSIS — J449 Chronic obstructive pulmonary disease, unspecified: Secondary | ICD-10-CM | POA: Diagnosis not present

## 2024-04-15 ENCOUNTER — Ambulatory Visit: Admitting: Internal Medicine

## 2024-04-22 ENCOUNTER — Other Ambulatory Visit: Payer: Self-pay | Admitting: Internal Medicine

## 2024-04-28 DIAGNOSIS — H43813 Vitreous degeneration, bilateral: Secondary | ICD-10-CM | POA: Diagnosis not present

## 2024-04-28 DIAGNOSIS — H353231 Exudative age-related macular degeneration, bilateral, with active choroidal neovascularization: Secondary | ICD-10-CM | POA: Diagnosis not present

## 2024-04-28 DIAGNOSIS — H2513 Age-related nuclear cataract, bilateral: Secondary | ICD-10-CM | POA: Diagnosis not present

## 2024-04-28 DIAGNOSIS — H35033 Hypertensive retinopathy, bilateral: Secondary | ICD-10-CM | POA: Diagnosis not present

## 2024-05-03 ENCOUNTER — Other Ambulatory Visit: Payer: Self-pay | Admitting: Internal Medicine

## 2024-05-03 ENCOUNTER — Encounter: Payer: Self-pay | Admitting: Internal Medicine

## 2024-05-03 NOTE — Progress Notes (Unsigned)
 Subjective:    Patient ID: Mary Acosta, female    DOB: November 03, 1955, 68 y.o.   MRN: [id number]      HPI Mary Acosta is here for No chief complaint on file.   Left hand pain/issues      Medications and allergies reviewed with patient and updated if appropriate.  Current Outpatient Medications on File Prior to Visit  Medication Sig Dispense Refill   albuterol  (VENTOLIN  HFA) 108 (90 Base) MCG/ACT inhaler INHALE 2 PUFFS BY MOUTH EVERY 6 HOURS AS NEEDED FOR WHEEZING OR FOR SHORTNESS OF BREATH 18 g 11   amLODipine  (NORVASC ) 10 MG tablet Take 1 tablet (10 mg total) by mouth daily. 30 tablet 0   atomoxetine (STRATTERA) 40 MG capsule Take 40 mg by mouth every morning. (Patient not taking: Reported on 03/26/2024)     baclofen (LIORESAL) 10 MG tablet Take 10 mg by mouth 2 (two) times daily. 02/10/24: Reports during TOC call takes prn     Buprenorphine HCl (BELBUCA) 300 MCG FILM Place inside cheek.     carvedilol  (COREG ) 25 MG tablet Take 1 tablet (25 mg total) by mouth 2 (two) times daily with a meal. 60 tablet 0   celecoxib  (CELEBREX ) 100 MG capsule TAKE 1 CAPSULE (100 MG TOTAL) BY MOUTH DAILY AS NEEDED FOR PAIN. 30 capsule 2   colchicine  0.6 MG tablet Take 1 tablet (0.6 mg total) by mouth daily. 30 tablet 5   Continuous Glucose Sensor (DEXCOM G7 SENSOR) MISC 3 each by Does not apply route every 30 (thirty) days. Apply 1 sensor every 10 days 9 each 4   ergocalciferol  (VITAMIN D2) 1.25 MG (50000 UT) capsule Take 1 capsule (50,000 Units total) by mouth once a week. Monday 12 capsule 3   fexofenadine (ALLEGRA) 180 MG tablet Take 180 mg by mouth daily as needed for allergies or rhinitis.     folic acid (FOLVITE) 1 MG tablet Take 1 mg by mouth daily.     furosemide  (LASIX ) 40 MG tablet TAKE 1 TABLET BY MOUTH EACH MORNING 30 tablet 10   gabapentin  (NEURONTIN ) 300 MG capsule Take 300 mg by mouth 2 (two) times daily.     hydrochlorothiazide  (HYDRODIURIL ) 25 MG tablet Take 1 tablet (25 mg total)  by mouth daily. 30 tablet 0   insulin  aspart (FIASP  FLEXTOUCH) 100 UNIT/ML FlexTouch Pen Inject 25-35 Units into the skin 3 (three) times daily before meals. 105 mL 3   insulin  degludec (TRESIBA  FLEXTOUCH) 200 UNIT/ML FlexTouch Pen Inject 50 Units into the skin 2 (two) times daily before a meal. 45 mL 3   insulin  glargine, 1 Unit Dial, (TOUJEO  SOLOSTAR) 300 UNIT/ML Solostar Pen Inject 25 Units into the skin 2 (two) times daily. (Patient taking differently: Inject 80 Units into the skin daily.)     Insulin  Lispro-aabc (LYUMJEV  KWIKPEN) 200 UNIT/ML KwikPen Inject 25-35 Units into the skin 3 (three) times daily. 45 mL 3   Insulin  Pen Needle (EMBECTA PEN NEEDLE NANO 2 GEN) 32G X 4 MM MISC USE AS DIRECTED 5 TIMES A DAY 200 each 9   Insulin  Syringe-Needle U-100 26G X 1/2 1 ML MISC Use daily for insulin  injection as directed 100 each 3   Ipratropium-Albuterol  (COMBIVENT  RESPIMAT) 20-100 MCG/ACT AERS respimat Inhale 1 puff into the lungs every 6 (six) hours as needed for wheezing. 4 g 5   ipratropium-albuterol  (DUONEB) 0.5-2.5 (3) MG/3ML SOLN Take 3 mLs by nebulization every 6 (six) hours as needed (Wheezing or dyspnea.). 360 mL 11  levothyroxine  (SYNTHROID ) 175 MCG tablet TAKE 1 TABLET BY MOUTH ONCE DAILY BEFORE BREAKFAST 30 tablet 10   metFORMIN  (GLUCOPHAGE ) 500 MG tablet TAKE 1 TABLET BY MOUTH EACH MORNING AND TAKE 1 TABLET EVERY DAY AT BEDTIME 180 tablet 3   naloxone  (NARCAN ) nasal spray 4 mg/0.1 mL One spray in either nostril once for known/suspected opioid overdose. May repeat every 2-3 minutes in alternating nostril til EMS arrives     PARoxetine  (PAXIL ) 20 MG tablet Take 20 mg by mouth daily.     PARoxetine  (PAXIL ) 40 MG tablet Take 1 tablet (40 mg total) by mouth daily. 90 tablet 1   promethazine  (PHENERGAN ) 25 MG tablet TAKE 1 TABLET BY MOUTH EVERY 6 HOURS AS NEEDED FOR NAUSEA OR VOMITING. 60 tablet 1   Propylene Glycol (SYSTANE BALANCE) 0.6 % SOLN Place 1 drop into both eyes daily as needed (dry  eyes).     Semaglutide , 2 MG/DOSE, (OZEMPIC , 2 MG/DOSE,) 8 MG/3ML SOPN Inject 2 mg into the skin once a week. 9 mL 3   simvastatin  (ZOCOR ) 20 MG tablet TAKE 1 TABLET BY MOUTH EVERY DAY AT BEDTIME 90 tablet 1   No current facility-administered medications on file prior to visit.    Review of Systems     Objective:  There were no vitals filed for this visit. BP Readings from Last 3 Encounters:  03/26/24 130/70  02/16/24 (!) 140/82  02/08/24 (!) 146/92   Wt Readings from Last 3 Encounters:  03/26/24 292 lb 12.8 oz (132.8 kg)  02/16/24 292 lb (132.5 kg)  02/08/24 296 lb 4.8 oz (134.4 kg)   There is no height or weight on file to calculate BMI.    Physical Exam         Assessment & Plan:    See Problem List for Assessment and Plan of chronic medical problems.

## 2024-05-04 ENCOUNTER — Ambulatory Visit (INDEPENDENT_AMBULATORY_CARE_PROVIDER_SITE_OTHER): Admitting: Internal Medicine

## 2024-05-04 VITALS — BP 128/76 | HR 90 | Temp 98.2°F | Ht 67.0 in | Wt 282.0 lb

## 2024-05-04 DIAGNOSIS — I10 Essential (primary) hypertension: Secondary | ICD-10-CM | POA: Diagnosis not present

## 2024-05-04 DIAGNOSIS — G629 Polyneuropathy, unspecified: Secondary | ICD-10-CM

## 2024-05-04 DIAGNOSIS — E538 Deficiency of other specified B group vitamins: Secondary | ICD-10-CM | POA: Diagnosis not present

## 2024-05-04 MED ORDER — CYANOCOBALAMIN 1000 MCG/ML IJ SOLN
1000.0000 ug | Freq: Once | INTRAMUSCULAR | Status: AC
  Administered 2024-05-04: 1000 ug via INTRAMUSCULAR

## 2024-05-04 NOTE — Assessment & Plan Note (Signed)
 Acute Cold sensation and intermittent pain in left lateral hand is likely related to nerve pain/neuropathy Likely related to nerve pain in left upper arm Taking gabapentin -continue Can discuss with orthopedics-?  Cervical radiculopathy versus other

## 2024-05-04 NOTE — Assessment & Plan Note (Addendum)
 Chronic Blood pressure well-controlled She has lost weight-continue weight loss efforts Monitor BP at home Continue amlodipine  10 mg daily, carvedilol  25 mg daily, HCTZ 25 mg daily

## 2024-05-04 NOTE — Assessment & Plan Note (Addendum)
 Chronic Stressed importance of B12 supplementation Will try to do monthly B12 injections B12 injection for today

## 2024-05-04 NOTE — Patient Instructions (Addendum)
     B12 injection today    Medications changes include :   None     Follow up with rheumatology

## 2024-05-12 DIAGNOSIS — J449 Chronic obstructive pulmonary disease, unspecified: Secondary | ICD-10-CM | POA: Diagnosis not present

## 2024-05-24 ENCOUNTER — Other Ambulatory Visit: Payer: Self-pay | Admitting: Internal Medicine

## 2024-05-24 ENCOUNTER — Other Ambulatory Visit: Payer: Self-pay | Admitting: Cardiology

## 2024-05-24 ENCOUNTER — Telehealth: Payer: Self-pay | Admitting: Gastroenterology

## 2024-05-24 DIAGNOSIS — K7460 Unspecified cirrhosis of liver: Secondary | ICD-10-CM

## 2024-05-24 DIAGNOSIS — I10 Essential (primary) hypertension: Secondary | ICD-10-CM

## 2024-05-24 DIAGNOSIS — R110 Nausea: Secondary | ICD-10-CM

## 2024-05-24 DIAGNOSIS — G4733 Obstructive sleep apnea (adult) (pediatric): Secondary | ICD-10-CM

## 2024-05-24 NOTE — Telephone Encounter (Signed)
 Spoke with pt. She reports that she is having persistent nausea. Reports she is taking the Phenergan  and it is not helping. She has ODT Zofran , but reports that taking this makes her more nauseated and she has even vomited because of the flavor. Reports she doesn't eat that much sometimes only 1 meal per day. Pt has hx of gastroparesis, and she currently takes Ozempic  for her diabetes. Pt does state, I don't take it like I'm supposed to, I often take the shot later. Appt scheduled with Dr. Legrand for 06/07/2024 at 2:20. Routing to provider to further review and advise.

## 2024-05-24 NOTE — Telephone Encounter (Signed)
 Received a call from patient stating she has been dealing with gastroparesis and cirrhosis problems, she is unaware when she need to have her liver tests, requesting nurse fu call. Please review and advise   Thank you

## 2024-05-25 NOTE — Telephone Encounter (Signed)
 Attempted to reach patient. No answer, not able to leave VM due to mailbox is full.

## 2024-05-26 ENCOUNTER — Other Ambulatory Visit: Payer: Self-pay | Admitting: Internal Medicine

## 2024-05-26 NOTE — Telephone Encounter (Signed)
 Again attempted to reach pt to discuss provider recommendations. No answer, vm box is full. Unable to leave message.

## 2024-05-27 MED ORDER — ONDANSETRON HCL 4 MG PO TABS: 4.0000 mg | ORAL_TABLET | Freq: Three times a day (TID) | ORAL | 0 refills | Status: DC | PRN

## 2024-05-27 NOTE — Telephone Encounter (Signed)
 Spoke with pt. Pt agrees to come in for lab work. Orders placed. Pt willing to try regular Zofran . Ordered. Discussed with pt the possibility of speaking with her provider to hold her Ozempic  until she has followed up with Dr. Legrand. Pt states I don't take it all of the time anyway so I can just hold it. Explained to pt that it would be best for her to speak with the prescribing provider so they are aware of the situation as well. Verbalized understanding.

## 2024-05-29 ENCOUNTER — Other Ambulatory Visit: Payer: Self-pay | Admitting: Internal Medicine

## 2024-06-07 ENCOUNTER — Other Ambulatory Visit (INDEPENDENT_AMBULATORY_CARE_PROVIDER_SITE_OTHER)

## 2024-06-07 ENCOUNTER — Ambulatory Visit: Admitting: Gastroenterology

## 2024-06-07 ENCOUNTER — Encounter: Payer: Self-pay | Admitting: Gastroenterology

## 2024-06-07 VITALS — BP 130/80 | HR 86 | Ht 69.0 in | Wt 274.2 lb

## 2024-06-07 DIAGNOSIS — Z794 Long term (current) use of insulin: Secondary | ICD-10-CM | POA: Diagnosis not present

## 2024-06-07 DIAGNOSIS — R140 Abdominal distension (gaseous): Secondary | ICD-10-CM

## 2024-06-07 DIAGNOSIS — K7460 Unspecified cirrhosis of liver: Secondary | ICD-10-CM

## 2024-06-07 DIAGNOSIS — R112 Nausea with vomiting, unspecified: Secondary | ICD-10-CM

## 2024-06-07 DIAGNOSIS — R600 Localized edema: Secondary | ICD-10-CM

## 2024-06-07 DIAGNOSIS — R6881 Early satiety: Secondary | ICD-10-CM

## 2024-06-07 DIAGNOSIS — E119 Type 2 diabetes mellitus without complications: Secondary | ICD-10-CM

## 2024-06-07 NOTE — Progress Notes (Signed)
 [name] Progress Note  Chief Complaint: Cirrhosis as well as nausea and vomiting  Summary of GI history:  Cryptogenic cirrhosis -suspected from [facility].  She also has chronic upper abdominal discomfort with bloating and nausea in the setting of poorly controlled diabetes. Prior trial of metronidazole  as empiric therapy for possible SIBO, and at follow-up she could not recall if it helped. Last variceal screening EGD March 2023 with following findings: Portal hypertensive gastropathy. - Normal examined duodenum. - No specimens collected. Normal appearance of LapBand compression in gastric cardia.  Subjective  HPI:  Mary Acosta follows up today for her cirrhosis and her upper abdominal pain with nausea and vomiting.  She contacted us  last month experiencing more nausea around the time of her Ozempic  injection, and she did not like the taste of the ODT form of Zofran .  Our PA took the call and changed her to regular tablet form, and she finds this effective and also more palatable.  With that she has been able to take Ozempic  regularly, and she seems to recall the dose may have been increased sometime over the last year.  (That may have accounted for some exacerbation of her upper GI symptoms).  However, that medicine and her other endocrinology treatments have significantly improved her diabetes over the course of this year and she has been able to lose a significant amount of weight as well.  This has helped improve her activity level to improve her overall health and mood. She does still need to take buprenorphine daily for chronic orthopedic pain.  ROS: Cardiovascular:  no chest pain Respiratory: no dyspnea Chronic knee pain and back pain Mood stable Remainder systems negative except as above  The patient's Past Medical, Family and Social History were reviewed and are on file in the EMR. Past Medical History:  Diagnosis Date   AKI (acute kidney injury) 01/2017   Allergy     seasonal   Anxiety    with panic attacks   Arthritis    back; feet; hands; shoulders (08/26/2014)   Asthma    Cataract    forming   Cervical cancer (HCC)    Chronic lower back pain    Chronic narcotic use    Chronic pain syndrome    [facility]   Cirrhosis (HCC)    Clostridium difficile infection 2017   COPD (chronic obstructive pulmonary disease) (HCC)    Daily headache    past hx   Depression    Diabetic neuropathy (HCC) 06/04/2017   feet  and legs , some in hands   DJD (degenerative joint disease)    Fatty liver disease, nonalcoholic    Fibromyalgia    Frequency of urination    HCAP (healthcare-associated pneumonia) 08/26/2014   History of TIA (transient ischemic attack) 11/01/2010   NO RESIDUAL   Hyperlipidemia    Hypertension    Hypothyroidism    IDDM (insulin  dependent diabetes mellitus)    Insomnia    Lumbar stenosis    Macular degeneration    Memory difficulty 07/25/2016   Nocturia    OSA (obstructive sleep apnea)    NO CPAP SINCE WT LOSS   Osteoarthritis    with severe disease in knee   Oxygen  deficiency    4  liters at bedtime only   Pneumonia several times   Polymyalgia rheumatica    Scoliosis    Seasonal allergies    Stroke [facility])    TIA   Thyroid  cancer (HCC)  Urgency of urination    Vaginal pain S/P SLING  FEB 2012    Past Surgical History:  Procedure Laterality Date   APPENDECTOMY  1982   BREAST EXCISIONAL BIOPSY Left 02/28/2005   Atypical Ductal Hyperplasia   CARDIAC CATHETERIZATION  09/04/2004   NORMAL CORONARY ANATOMY/ NORMAL LVF/ EF 60%   CARDIOVASCULAR STRESS TEST  12-27-2010  DR SWAZILAND   ABNORMAL NUCLEAR STUDY W/ /MILD INFERIOR ISCHEMIA/ EF 69%/  CT HEART ANGIOGRAM ;  NO ACUTE FINDINGS   COLONOSCOPY     CRYOABLATION  05/16/2003   w/LEEP FOR ABNORMAL PAP SMEAR   CYSTOSCOPY  05/18/2012   Procedure: CYSTOSCOPY;  Surgeon: Glendia DELENA Elizabeth, MD;  Location: [facility];  Service: Urology;  Laterality:  N/A;  examination under anethesia   ESOPHAGOGASTRODUODENOSCOPY (EGD) WITH PROPOFOL  N/A 09/03/2016   Procedure: ESOPHAGOGASTRODUODENOSCOPY (EGD) WITH PROPOFOL ;  Surgeon: Victory LITTIE Legrand DOUGLAS, MD;  Location: [facility];  Service: Gastroenterology;  Laterality: N/A;   ESOPHAGOGASTRODUODENOSCOPY (EGD) WITH PROPOFOL  N/A 11/19/2021   Procedure: ESOPHAGOGASTRODUODENOSCOPY (EGD) WITH PROPOFOL ;  Surgeon: Legrand Victory LITTIE DOUGLAS, MD;  Location: [facility];  Service: Gastroenterology;  Laterality: N/A;  Varices screeing, cirrhosis   HYSTEROSCOPY WITH D & C  08/19/2007   PMB   KNEE ARTHROSCOPY W/ DEBRIDEMENT Left 03/29/2006   INTERNAL DERANGEMENT/ SEVERE DJD/ MENISCUS TEARS   LAPAROSCOPIC CHOLECYSTECTOMY  06/10/2002   LAPAROSCOPIC GASTRIC BANDING  03/01/2006   TRUNCAL VAGOTOMY/ PLACEMENT OF VG BAND   REVISION TOTAL KNEE ARTHROPLASTY Left 08-29-2008; 05/2009   TONSILLECTOMY  1969   TOTAL KNEE ARTHROPLASTY Left 01/23/2007   SEVERE DJD   TOTAL THYROIDECTOMY  11/22/2005   BILATERAL THYROID  NODULES-- PAPILLARY CARCINOMA (0.5CM)/ ADENOMATOID NODULES   TRANSTHORACIC ECHOCARDIOGRAM  12/27/2010   LVSF NORMAL / EF 55-65%/ GRADE I DIASTOLIC DYSFUNCTION/ MILD MITRAL REGURG. / MILDLY DILATED LEFT ATRIUM/ MILDY INCREASED SYSTOLIC PRESSURE OF PULMONARY ARTERIES   TRANSVAGINAL SUBURETERAL TAPE/ SLING  09/28/2010   MIXED URINARY INCONTINENCE   TUBAL LIGATION  1983    Objective:  Med list reviewed  Current Outpatient Medications:    albuterol  (VENTOLIN  HFA) 108 (90 Base) MCG/ACT inhaler, INHALE 2 PUFFS BY MOUTH EVERY 6 HOURS AS NEEDED FOR WHEEZING OR FOR SHORTNESS OF BREATH, Disp: 18 g, Rfl: 11   atomoxetine (STRATTERA) 40 MG capsule, Take 40 mg by mouth every morning., Disp: , Rfl:    baclofen (LIORESAL) 10 MG tablet, Take 10 mg by mouth 2 (two) times daily. 02/10/24: Reports during TOC call takes prn, Disp: , Rfl:    Buprenorphine HCl (BELBUCA) 300 MCG FILM, Place inside cheek., Disp: , Rfl:    busPIRone  (BUSPAR )  30 MG tablet, Take 30 mg by mouth 2 (two) times daily., Disp: , Rfl:    carvedilol  (COREG ) 25 MG tablet, Take 1 tablet (25 mg total) by mouth 2 (two) times daily with a meal., Disp: 60 tablet, Rfl: 0   celecoxib  (CELEBREX ) 100 MG capsule, TAKE 1 CAPSULE (100 MG TOTAL) BY MOUTH DAILY AS NEEDED FOR PAIN., Disp: 30 capsule, Rfl: 2   colchicine  0.6 MG tablet, TAKE 1 TABLET BY MOUTH EVERY DAY, Disp: 90 tablet, Rfl: 1   Continuous Glucose Sensor (DEXCOM G7 SENSOR) MISC, 3 each by Does not apply route every 30 (thirty) days. Apply 1 sensor every 10 days, Disp: 9 each, Rfl: 4   ergocalciferol  (VITAMIN D2) 1.25 MG (50000 UT) capsule, Take 1 capsule (50,000 Units total) by mouth once a week. Monday, Disp: 12 capsule, Rfl: 3   fexofenadine (  ALLEGRA) 180 MG tablet, Take 180 mg by mouth daily as needed for allergies or rhinitis., Disp: , Rfl:    folic acid (FOLVITE) 1 MG tablet, Take 1 mg by mouth daily., Disp: , Rfl:    furosemide  (LASIX ) 40 MG tablet, TAKE 1 TABLET BY MOUTH EACH MORNING, Disp: 30 tablet, Rfl: 11   gabapentin  (NEURONTIN ) 300 MG capsule, Take 300 mg by mouth 2 (two) times daily., Disp: , Rfl:    hydrochlorothiazide  (HYDRODIURIL ) 25 MG tablet, Take 1 tablet (25 mg total) by mouth daily., Disp: 30 tablet, Rfl: 0   insulin  aspart (FIASP  FLEXTOUCH) 100 UNIT/ML FlexTouch Pen, Inject 25-35 Units into the skin 3 (three) times daily before meals., Disp: 105 mL, Rfl: 3   insulin  degludec (TRESIBA  FLEXTOUCH) 200 UNIT/ML FlexTouch Pen, Inject 50 Units into the skin 2 (two) times daily before a meal., Disp: 45 mL, Rfl: 3   insulin  glargine, 1 Unit Dial, (TOUJEO  SOLOSTAR) 300 UNIT/ML Solostar Pen, Inject 25 Units into the skin 2 (two) times daily. (Patient taking differently: Inject 80 Units into the skin daily.), Disp: , Rfl:    Insulin  Lispro-aabc (LYUMJEV  KWIKPEN) 200 UNIT/ML KwikPen, Inject 25-35 Units into the skin 3 (three) times daily., Disp: 45 mL, Rfl: 3   Insulin  Pen Needle (EMBECTA PEN NEEDLE NANO 2  GEN) 32G X 4 MM MISC, USE AS DIRECTED 5 TIMES A DAY, Disp: 200 each, Rfl: 9   Insulin  Syringe-Needle U-100 26G X 1/2 1 ML MISC, Use daily for insulin  injection as directed, Disp: 100 each, Rfl: 3   Ipratropium-Albuterol  (COMBIVENT  RESPIMAT) 20-100 MCG/ACT AERS respimat, Inhale 1 puff into the lungs every 6 (six) hours as needed for wheezing., Disp: 4 g, Rfl: 5   ipratropium-albuterol  (DUONEB) 0.5-2.5 (3) MG/3ML SOLN, Take 3 mLs by nebulization every 6 (six) hours as needed (Wheezing or dyspnea.)., Disp: 360 mL, Rfl: 11   levothyroxine  (SYNTHROID ) 175 MCG tablet, TAKE 1 TABLET BY MOUTH ONCE DAILY BEFORE BREAKFAST, Disp: 30 tablet, Rfl: 10   lidocaine  (LIDODERM ) 5 %, Place 1 patch onto the skin daily., Disp: , Rfl:    metFORMIN  (GLUCOPHAGE ) 500 MG tablet, TAKE 1 TABLET BY MOUTH EACH MORNING AND TAKE 1 TABLET EVERY DAY AT BEDTIME, Disp: 180 tablet, Rfl: 3   naloxone  (NARCAN ) nasal spray 4 mg/0.1 mL, One spray in either nostril once for known/suspected opioid overdose. May repeat every 2-3 minutes in alternating nostril til EMS arrives, Disp: , Rfl:    ondansetron  (ZOFRAN ) 4 MG tablet, Take 1 tablet (4 mg total) by mouth every 8 (eight) hours as needed for nausea or vomiting., Disp: 20 tablet, Rfl: 0   promethazine  (PHENERGAN ) 25 MG tablet, TAKE 1 TABLET BY MOUTH EVERY 6 HOURS AS NEEDED FOR NAUSEA OR VOMITING., Disp: 60 tablet, Rfl: 1   Propylene Glycol (SYSTANE BALANCE) 0.6 % SOLN, Place 1 drop into both eyes daily as needed (dry eyes)., Disp: , Rfl:    Semaglutide , 2 MG/DOSE, (OZEMPIC , 2 MG/DOSE,) 8 MG/3ML SOPN, Inject 2 mg into the skin once a week., Disp: 9 mL, Rfl: 3   simvastatin  (ZOCOR ) 20 MG tablet, TAKE 1 TABLET BY MOUTH EVERY DAY AT BEDTIME, Disp: 30 tablet, Rfl: 0   amLODipine  (NORVASC ) 10 MG tablet, Take 1 tablet (10 mg total) by mouth daily. (Patient not taking: Reported on 06/07/2024), Disp: 30 tablet, Rfl: 0   PARoxetine  (PAXIL ) 20 MG tablet, Take 20 mg by mouth daily. (Patient not taking:  Reported on 06/07/2024), Disp: , Rfl:    PARoxetine  (  PAXIL ) 40 MG tablet, Take 1 tablet (40 mg total) by mouth daily. (Patient not taking: Reported on 06/07/2024), Disp: 90 tablet, Rfl: 1   Vital signs in last 24 hrs: Vitals:   06/07/24 1425  BP: 130/80  Pulse: 86   Wt Readings from Last 3 Encounters:  06/07/24 274 lb 4 oz (124.4 kg)  05/04/24 282 lb (127.9 kg)  03/26/24 292 lb 12.8 oz (132.8 kg)    Physical Exam  She looks better than I have seen her in the last couple of years and she has noticeably lost weight.  Affect more positive HEENT: sclera anicteric, oral mucosa moist without lesions Neck: supple, no thyromegaly, JVD or lymphadenopathy Cardiac: Regular without appreciable murmur, pretibial edema bilaterally Pulm: clear to auscultation bilaterally, normal RR and effort noted Abdomen: soft, mild upper tenderness, with active bowel sounds.  Difficult to assess mass or hepatosplenomegaly due to body habitus/obesity  skin; warm and dry, no jaundice or rash  Labs:  Last hemoglobin A1c 8.3 in August, down from 11.5 in March     Latest Ref Rng & Units 02/16/2024    2:32 PM 02/08/2024    7:41 AM 02/07/2024    7:09 AM  CBC  WBC 4.0 - 10.5 K/uL 10.8  7.9  7.7   Hemoglobin 12.0 - 15.0 g/dL 86.3  85.4  86.7   Hematocrit 36.0 - 46.0 % 42.5  45.9  42.6   Platelets 150.0 - 400.0 K/uL 213.0  179  159       Latest Ref Rng & Units 02/16/2024    2:32 PM 02/08/2024    7:41 AM 02/07/2024    7:09 AM  CMP  Glucose 70 - 99 mg/dL 829  856  819   BUN 6 - 23 mg/dL 20  18  24    Creatinine 0.40 - 1.20 mg/dL 8.70  9.12  9.11   Sodium 135 - 145 mEq/L 138  135  135   Potassium 3.5 - 5.1 mEq/L 3.9  4.0  4.0   Chloride 96 - 112 mEq/L 97  91  94   CO2 19 - 32 mEq/L 29  31  30    Calcium  8.4 - 10.5 mg/dL 89.7  89.8  9.8    Lab Results  Component Value Date   INR 1.0 02/02/2024   INR 1.1 (H) 06/27/2022   INR 1.1 05/30/2021   Last AFP normal at 2.9 in February  2025 ___________________________________________ Radiologic studies:  CLINICAL DATA:  Right upper quadrant pain.  Nausea.  Cirrhosis.   EXAM: CT ABDOMEN WITHOUT AND WITH CONTRAST   TECHNIQUE: Multidetector CT imaging of the abdomen was performed following the standard protocol before and following the bolus administration of intravenous contrast.   RADIATION DOSE REDUCTION: This exam was performed according to the departmental dose-optimization program which includes automated exposure control, adjustment of the mA and/or kV according to patient size and/or use of iterative reconstruction technique.   CONTRAST:  OMNIPAQUE  IOHEXOL  300 MG/ML  SOLN   COMPARISON:  02/05/2022   FINDINGS: Lower chest: No acute findings.   Hepatobiliary: Cirrhosis again demonstrated. No hepatic masses identified. No evidence of ascites.   Prior cholecystectomy. No evidence of biliary obstruction.   Pancreas:  No mass or inflammatory changes.   Spleen:  Within normal limits in size and appearance.   Adrenals/Urinary Tract: No suspicious masses identified. No evidence of hydronephrosis.   Stomach/Bowel: Gastric lap band remains in appropriate position. Otherwise unremarkable.   Vascular/Lymphatic: No pathologically enlarged lymph nodes  identified. No acute vascular findings.   Other:  None.   Musculoskeletal:  No suspicious bone lesions identified.   IMPRESSION: Cirrhosis. No radiographic evidence of hepatocellular carcinoma or other acute findings.     Electronically Signed   By: Norleen DELENA Kil M.D.   On: 11/01/2023 15:44 ____________________________________________ Other: EKG June 2025 with a QTc = 440 in setting of right bundle branch block and first-degree AV block  _____________________________________________ Assessment & Plan  Assessment: Encounter Diagnoses  Name Primary?   Nausea and vomiting in adult Yes   Early satiety    Cirrhosis of liver without ascites,  unspecified hepatic cirrhosis type (HCC)    Peripheral edema    Abdominal bloating    Stable cirrhosis that probably occurred from [facility].  Glad to see her diabetes is doing so much better, as this will help many aspects of her health including but not limited to her liver disease.  Due for [facility] screening  Chronic stable peripheral edema from obesity and portal hypertension  Nausea and vomiting, multifactorial from diabetes related upper GI dysmotility as well as chronic opioid use and GLP-1 agonist.  She has been managing things lately with ondansetron  and better glucose control.   Plan: AFP and RUQ US  Follow-up with me in 6 months or sooner if needed Continue as needed use of ondansetron   35 minutes were spent on this encounter (including chart review, history/exam, counseling/coordination of care, and documentation) > 50% of that time was spent on counseling and coordination of care.   Victory LITTIE Brand III

## 2024-06-07 NOTE — Patient Instructions (Signed)
 You have been scheduled for an abdominal ultrasound at [facility] (1st floor of hospital) on 06/09/24 at 7 am. Please arrive 15 minutes prior to your appointment for registration. Make certain not to have anything to eat or drink after midnight prior to your appointment. Should you need to reschedule your appointment, please contact radiology at [phone]. This test typically takes about 30 minutes to perform.   Your provider has requested that you go to the basement level for lab work before leaving today. Press B on the elevator. The lab is located at the first door on the left as you exit the elevator.   _______________________________________________________  If your blood pressure at your visit was 140/90 or greater, please contact your primary care physician to follow up on this.  _______________________________________________________  If you are age 31 or older, your body mass index should be between 23-30. Your Body mass index is 40.5 kg/m. If this is out of the aforementioned range listed, please consider follow up with your Primary Care Provider.  If you are age 53 or younger, your body mass index should be between 19-25. Your Body mass index is 40.5 kg/m. If this is out of the aformentioned range listed, please consider follow up with your Primary Care Provider.   ________________________________________________________  The [facility] providers would like to encourage you to use MYCHART to communicate with providers for non-urgent requests or questions.  Due to long hold times on the telephone, sending your provider a message by [facility] may be a faster and more efficient way to get a response.  Please allow 48 business hours for a response.  Please remember that this is for non-urgent requests.  _______________________________________________________  [facility] is using a team-based approach to care.  Your team is made up of your doctor and two to  three APPS. Our APPS (Nurse Practitioners and Physician Assistants) work with your physician to ensure care continuity for you. They are fully qualified to address your health concerns and develop a treatment plan. They communicate directly with your gastroenterologist to care for you. Seeing the Advanced Practice Practitioners on your physician's team can help you by facilitating care more promptly, often allowing for earlier appointments, access to diagnostic testing, procedures, and other specialty referrals.    Thank you for trusting me with your gastrointestinal care!    Dr. Victory Legrand DOUGLAS [facility]

## 2024-06-08 ENCOUNTER — Encounter: Payer: Self-pay | Admitting: Internal Medicine

## 2024-06-08 NOTE — Progress Notes (Unsigned)
 Subjective:    Patient ID: Mary Acosta, female    DOB: February 20, 1956, 68 y.o.   MRN: [id number]     HPI Mary Acosta is here for follow up of her chronic medical problems.  Foot - bottom of right foot - ? Infected - had callus on it and she got it off - wanted it to heal -- she think she damaged it more.  Saw podiatrist years ago and has for this try to keep the calluses under control.  No discharge.  No fever or chills.  The toe has gotten red and she was concerned about infection.  Having dysuria - home test was positive.    Medications and allergies reviewed with patient and updated if appropriate.  Current Outpatient Medications on File Prior to Visit  Medication Sig Dispense Refill   albuterol  (VENTOLIN  HFA) 108 (90 Base) MCG/ACT inhaler INHALE 2 PUFFS BY MOUTH EVERY 6 HOURS AS NEEDED FOR WHEEZING OR FOR SHORTNESS OF BREATH 18 g 11   atomoxetine (STRATTERA) 40 MG capsule Take 40 mg by mouth every morning.     baclofen (LIORESAL) 10 MG tablet Take 10 mg by mouth 2 (two) times daily. 02/10/24: Reports during TOC call takes prn     Buprenorphine HCl (BELBUCA) 300 MCG FILM Place inside cheek.     busPIRone  (BUSPAR ) 30 MG tablet Take 30 mg by mouth 2 (two) times daily.     carvedilol  (COREG ) 25 MG tablet Take 1 tablet (25 mg total) by mouth 2 (two) times daily with a meal. 60 tablet 0   celecoxib  (CELEBREX ) 100 MG capsule TAKE 1 CAPSULE (100 MG TOTAL) BY MOUTH DAILY AS NEEDED FOR PAIN. 30 capsule 2   colchicine  0.6 MG tablet TAKE 1 TABLET BY MOUTH EVERY DAY 90 tablet 1   Continuous Glucose Sensor (DEXCOM G7 SENSOR) MISC 3 each by Does not apply route every 30 (thirty) days. Apply 1 sensor every 10 days 9 each 4   ergocalciferol  (VITAMIN D2) 1.25 MG (50000 UT) capsule Take 1 capsule (50,000 Units total) by mouth once a week. Monday 12 capsule 3   fexofenadine (ALLEGRA) 180 MG tablet Take 180 mg by mouth daily as needed for allergies or rhinitis.     folic acid (FOLVITE) 1 MG  tablet Take 1 mg by mouth daily.     furosemide  (LASIX ) 40 MG tablet TAKE 1 TABLET BY MOUTH EACH MORNING 30 tablet 11   gabapentin  (NEURONTIN ) 300 MG capsule Take 300 mg by mouth 2 (two) times daily.     hydrochlorothiazide  (HYDRODIURIL ) 25 MG tablet Take 1 tablet (25 mg total) by mouth daily. 30 tablet 0   insulin  aspart (FIASP  FLEXTOUCH) 100 UNIT/ML FlexTouch Pen Inject 25-35 Units into the skin 3 (three) times daily before meals. 105 mL 3   insulin  degludec (TRESIBA  FLEXTOUCH) 200 UNIT/ML FlexTouch Pen Inject 50 Units into the skin 2 (two) times daily before a meal. 45 mL 3   insulin  glargine, 1 Unit Dial, (TOUJEO  SOLOSTAR) 300 UNIT/ML Solostar Pen Inject 25 Units into the skin 2 (two) times daily.     Insulin  Lispro-aabc (LYUMJEV  KWIKPEN) 200 UNIT/ML KwikPen Inject 25-35 Units into the skin 3 (three) times daily. 45 mL 3   Insulin  Pen Needle (EMBECTA PEN NEEDLE NANO 2 GEN) 32G X 4 MM MISC USE AS DIRECTED 5 TIMES A DAY 200 each 9   Insulin  Syringe-Needle U-100 26G X 1/2 1 ML MISC Use daily for insulin  injection as directed 100  each 3   Ipratropium-Albuterol  (COMBIVENT  RESPIMAT) 20-100 MCG/ACT AERS respimat Inhale 1 puff into the lungs every 6 (six) hours as needed for wheezing. 4 g 5   ipratropium-albuterol  (DUONEB) 0.5-2.5 (3) MG/3ML SOLN Take 3 mLs by nebulization every 6 (six) hours as needed (Wheezing or dyspnea.). 360 mL 11   levothyroxine  (SYNTHROID ) 175 MCG tablet TAKE 1 TABLET BY MOUTH ONCE DAILY BEFORE BREAKFAST 30 tablet 10   lidocaine  (LIDODERM ) 5 % Place 1 patch onto the skin daily.     metFORMIN  (GLUCOPHAGE ) 500 MG tablet TAKE 1 TABLET BY MOUTH EACH MORNING AND TAKE 1 TABLET EVERY DAY AT BEDTIME 180 tablet 3   naloxone  (NARCAN ) nasal spray 4 mg/0.1 mL One spray in either nostril once for known/suspected opioid overdose. May repeat every 2-3 minutes in alternating nostril til EMS arrives     ondansetron  (ZOFRAN ) 4 MG tablet Take 1 tablet (4 mg total) by mouth every 8 (eight) hours as  needed for nausea or vomiting. 20 tablet 0   promethazine  (PHENERGAN ) 25 MG tablet TAKE 1 TABLET BY MOUTH EVERY 6 HOURS AS NEEDED FOR NAUSEA OR VOMITING. 60 tablet 1   Propylene Glycol (SYSTANE BALANCE) 0.6 % SOLN Place 1 drop into both eyes daily as needed (dry eyes).     Semaglutide , 2 MG/DOSE, (OZEMPIC , 2 MG/DOSE,) 8 MG/3ML SOPN Inject 2 mg into the skin once a week. 9 mL 3   simvastatin  (ZOCOR ) 20 MG tablet TAKE 1 TABLET BY MOUTH EVERY DAY AT BEDTIME 30 tablet 0   amLODipine  (NORVASC ) 10 MG tablet Take 1 tablet (10 mg total) by mouth daily. (Patient not taking: Reported on 06/07/2024) 30 tablet 0   PARoxetine  (PAXIL ) 20 MG tablet Take 20 mg by mouth daily. (Patient not taking: Reported on 06/09/2024)     PARoxetine  (PAXIL ) 40 MG tablet Take 1 tablet (40 mg total) by mouth daily. (Patient not taking: Reported on 06/09/2024) 90 tablet 1   No current facility-administered medications on file prior to visit.     Review of Systems  Constitutional:  Negative for fever.  Respiratory:  Positive for shortness of breath (occ). Negative for cough and wheezing.   Cardiovascular:  Negative for chest pain, palpitations and leg swelling.  Genitourinary:  Positive for dysuria.  Musculoskeletal:  Positive for arthralgias and back pain.  Neurological:  Negative for light-headedness and headaches.  Psychiatric/Behavioral:  Positive for dysphoric mood. The patient is nervous/anxious.        Objective:   Vitals:   06/09/24 1454  BP: 136/78  Pulse: 86  Temp: 98.1 F (36.7 C)  SpO2: 95%   BP Readings from Last 3 Encounters:  06/09/24 136/78  06/07/24 130/80  05/04/24 128/76   Wt Readings from Last 3 Encounters:  06/09/24 282 lb (127.9 kg)  06/07/24 274 lb 4 oz (124.4 kg)  05/04/24 282 lb (127.9 kg)   Body mass index is 41.64 kg/m.    Physical Exam Constitutional:      General: She is not in acute distress.    Appearance: Normal appearance.  HENT:     Head: Normocephalic and  atraumatic.  Eyes:     Conjunctiva/sclera: Conjunctivae normal.  Cardiovascular:     Rate and Rhythm: Normal rate and regular rhythm.     Heart sounds: Normal heart sounds.  Pulmonary:     Effort: Pulmonary effort is normal. No respiratory distress.     Breath sounds: Normal breath sounds. No wheezing.  Musculoskeletal:     Cervical back: Neck  supple.     Right lower leg: No edema.     Left lower leg: No edema.  Lymphadenopathy:     Cervical: No cervical adenopathy.  Skin:    General: Skin is warm and dry.     Findings: No rash.     Comments: Small laceration/bruise plantar surface of third right distal toe without active discharge or bleeding.  Slightly tender.  Erythema from lateral aspect extending down toe  Neurological:     Mental Status: She is alert. Mental status is at baseline.  Psychiatric:        Mood and Affect: Mood normal.        Behavior: Behavior normal.        Lab Results  Component Value Date   WBC 10.8 (H) 02/16/2024   HGB 13.6 02/16/2024   HCT 42.5 02/16/2024   PLT 213.0 02/16/2024   GLUCOSE 170 (H) 02/16/2024   CHOL 163 05/27/2023   TRIG 178 (H) 02/03/2024   HDL 32.30 (L) 05/27/2023   LDLCALC 88 05/27/2023   ALT 27 02/02/2024   AST 30 02/02/2024   NA 138 02/16/2024   K 3.9 02/16/2024   CL 97 02/16/2024   CREATININE 1.29 (H) 02/16/2024   BUN 20 02/16/2024   CO2 29 02/16/2024   TSH 2.47 03/26/2024   INR 1.0 02/02/2024   HGBA1C 8.3 (A) 03/26/2024   MICROALBUR 0.7 03/26/2024     Assessment & Plan:    See Problem List for Assessment and Plan of chronic medical problems.

## 2024-06-09 ENCOUNTER — Ambulatory Visit (HOSPITAL_COMMUNITY)
Admission: RE | Admit: 2024-06-09 | Discharge: 2024-06-09 | Disposition: A | Discharge status: Home / Self Care | Source: Ambulatory Visit | Attending: Gastroenterology | Admitting: Gastroenterology

## 2024-06-09 ENCOUNTER — Ambulatory Visit: Admitting: Internal Medicine

## 2024-06-09 VITALS — BP 136/78 | HR 86 | Temp 98.1°F | Ht 69.0 in | Wt 282.0 lb

## 2024-06-09 DIAGNOSIS — M109 Gout, unspecified: Secondary | ICD-10-CM

## 2024-06-09 DIAGNOSIS — E1169 Type 2 diabetes mellitus with other specified complication: Secondary | ICD-10-CM

## 2024-06-09 DIAGNOSIS — E559 Vitamin D deficiency, unspecified: Secondary | ICD-10-CM | POA: Diagnosis not present

## 2024-06-09 DIAGNOSIS — K7581 Nonalcoholic steatohepatitis (NASH): Secondary | ICD-10-CM

## 2024-06-09 DIAGNOSIS — L03031 Cellulitis of right toe: Secondary | ICD-10-CM | POA: Diagnosis not present

## 2024-06-09 DIAGNOSIS — N3289 Other specified disorders of bladder: Secondary | ICD-10-CM

## 2024-06-09 DIAGNOSIS — E1159 Type 2 diabetes mellitus with other circulatory complications: Secondary | ICD-10-CM | POA: Diagnosis not present

## 2024-06-09 DIAGNOSIS — E1142 Type 2 diabetes mellitus with diabetic polyneuropathy: Secondary | ICD-10-CM

## 2024-06-09 DIAGNOSIS — Z23 Encounter for immunization: Secondary | ICD-10-CM

## 2024-06-09 DIAGNOSIS — K7689 Other specified diseases of liver: Secondary | ICD-10-CM | POA: Diagnosis not present

## 2024-06-09 DIAGNOSIS — E890 Postprocedural hypothyroidism: Secondary | ICD-10-CM

## 2024-06-09 DIAGNOSIS — Z9049 Acquired absence of other specified parts of digestive tract: Secondary | ICD-10-CM | POA: Diagnosis not present

## 2024-06-09 DIAGNOSIS — E2839 Other primary ovarian failure: Secondary | ICD-10-CM

## 2024-06-09 DIAGNOSIS — R3989 Other symptoms and signs involving the genitourinary system: Secondary | ICD-10-CM | POA: Diagnosis not present

## 2024-06-09 DIAGNOSIS — R300 Dysuria: Secondary | ICD-10-CM | POA: Diagnosis not present

## 2024-06-09 DIAGNOSIS — E785 Hyperlipidemia, unspecified: Secondary | ICD-10-CM

## 2024-06-09 DIAGNOSIS — K7460 Unspecified cirrhosis of liver: Secondary | ICD-10-CM | POA: Insufficient documentation

## 2024-06-09 DIAGNOSIS — F411 Generalized anxiety disorder: Secondary | ICD-10-CM

## 2024-06-09 DIAGNOSIS — E538 Deficiency of other specified B group vitamins: Secondary | ICD-10-CM | POA: Diagnosis not present

## 2024-06-09 DIAGNOSIS — K7469 Other cirrhosis of liver: Secondary | ICD-10-CM

## 2024-06-09 DIAGNOSIS — F3289 Other specified depressive episodes: Secondary | ICD-10-CM

## 2024-06-09 LAB — URINALYSIS, ROUTINE W REFLEX MICROSCOPIC
Bilirubin Urine: NEGATIVE
Hgb urine dipstick: NEGATIVE
Ketones, ur: NEGATIVE
Leukocytes,Ua: NEGATIVE
Nitrite: NEGATIVE
RBC / HPF: NONE SEEN (ref 0–?)
Specific Gravity, Urine: 1.025 (ref 1.000–1.030)
Total Protein, Urine: NEGATIVE
Urine Glucose: 500 — AB
Urobilinogen, UA: 0.2 (ref 0.0–1.0)
WBC, UA: NONE SEEN (ref 0–?)
pH: 6 (ref 5.0–8.0)

## 2024-06-09 LAB — AFP TUMOR MARKER: AFP-Tumor Marker: 2.8 ng/mL

## 2024-06-09 MED ORDER — AMOXICILLIN-POT CLAVULANATE 875-125 MG PO TABS
1.0000 | ORAL_TABLET | Freq: Two times a day (BID) | ORAL | 0 refills | Status: AC
Start: 2024-06-09 — End: 2024-06-19

## 2024-06-09 MED ORDER — AMITRIPTYLINE HCL 50 MG PO TABS: 50.0000 mg | ORAL_TABLET | Freq: Every day | ORAL | 5 refills | Status: DC | PRN

## 2024-06-09 NOTE — Assessment & Plan Note (Signed)
 Chronic Has a history of intermittent painful bladder spasms In the past was taking amitriptyline  50 mg nightly for 2-3 night that as needed and that would typically stop the spasms Okay with her continuing that Will prescribe amitriptyline  50 mg nightly for a few nights as needed for bladder spasms

## 2024-06-09 NOTE — Assessment & Plan Note (Signed)
 Acute Likely occurred after he trimmed the callus on the plantar surface of her distal right middle toe There is what appears to be a small cut without active discharge or bleeding and erythema extending from that down the toe.  The area is tender Start Augmentin  875-125 mg twice daily x 10 days She is a diabetic-stressed that she monitor this closely to make sure the infection is improving

## 2024-06-09 NOTE — Assessment & Plan Note (Signed)
 Chronic Continue simvastatin  20 mg-she is not taking this on a daily-taking this intermittently

## 2024-06-09 NOTE — Assessment & Plan Note (Signed)
 Acute Concern for possible infection-Home test was positive UA, urine culture today

## 2024-06-09 NOTE — Assessment & Plan Note (Signed)
 Chronic Continue vitamin D  supplementation Check level with next blood work

## 2024-06-09 NOTE — Assessment & Plan Note (Signed)
 Chronic Decreased sensation in both feet Decreased balance Fall prevention

## 2024-06-09 NOTE — Assessment & Plan Note (Signed)
 Chronic Following with GI Had recent ultrasound AFP done recently and pending

## 2024-06-09 NOTE — Assessment & Plan Note (Signed)
 Chronic She has noted that colchicine helps her arthritis She tolerates medication well Start colchicine 0.6 mg daily - can increase if needed

## 2024-06-09 NOTE — Assessment & Plan Note (Signed)
 Chronic Management per psychiatry Not currently on any medication

## 2024-06-09 NOTE — Assessment & Plan Note (Signed)
 Chronic Associated with htn, hld  Lab Results  Component Value Date   HGBA1C 8.3 (A) 03/26/2024   Management per Dr Trixie

## 2024-06-09 NOTE — Assessment & Plan Note (Signed)
 Chronic Blood pressure borderline controlled Continue to work on weight loss Continue amlodipine  10 mg daily, carvedilol  25 mg daily, HCTZ 25 mg daily

## 2024-06-09 NOTE — Assessment & Plan Note (Signed)
Chronic Continue B12 supplementation 

## 2024-06-09 NOTE — Assessment & Plan Note (Signed)
 Chronic Management per psychiatry Continued on gabapentin  Gets a few tablets of clonazepam  a month

## 2024-06-09 NOTE — Patient Instructions (Addendum)
   Flu immunization administered today.     Urine tests were ordered.       Medications changes include :   Augmentin  twice daily      Return if symptoms worsen or fail to improve.

## 2024-06-09 NOTE — Assessment & Plan Note (Signed)
Chronic Management per endocrine 

## 2024-06-10 ENCOUNTER — Ambulatory Visit: Payer: Self-pay | Admitting: Internal Medicine

## 2024-06-10 ENCOUNTER — Ambulatory Visit: Payer: Self-pay | Admitting: Gastroenterology

## 2024-06-10 DIAGNOSIS — M85851 Other specified disorders of bone density and structure, right thigh: Secondary | ICD-10-CM

## 2024-06-10 LAB — URINE CULTURE

## 2024-06-11 DIAGNOSIS — J449 Chronic obstructive pulmonary disease, unspecified: Secondary | ICD-10-CM | POA: Diagnosis not present

## 2024-06-15 ENCOUNTER — Ambulatory Visit: Admitting: Emergency Medicine

## 2024-06-15 ENCOUNTER — Other Ambulatory Visit

## 2024-06-15 ENCOUNTER — Ambulatory Visit: Payer: Self-pay

## 2024-06-15 ENCOUNTER — Encounter: Payer: Self-pay | Admitting: Internal Medicine

## 2024-06-15 NOTE — Progress Notes (Unsigned)
 Subjective:    Patient ID: Mary Acosta, female    DOB: [birth year], 68 y.o.   MRN: [id number]      HPI Mary Acosta is here for No chief complaint on file.   Infected toe -      Medications and allergies reviewed with patient and updated if appropriate.  Current Outpatient Medications on File Prior to Visit  Medication Sig Dispense Refill   albuterol  (VENTOLIN  HFA) 108 (90 Base) MCG/ACT inhaler INHALE 2 PUFFS BY MOUTH EVERY 6 HOURS AS NEEDED FOR WHEEZING OR FOR SHORTNESS OF BREATH 18 g 11   amitriptyline  (ELAVIL ) 50 MG tablet Take 1 tablet (50 mg total) by mouth daily as needed for sleep. 30 tablet 5   amoxicillin -clavulanate (AUGMENTIN ) 875-125 MG tablet Take 1 tablet by mouth 2 (two) times daily for 10 days. 20 tablet 0   atomoxetine (STRATTERA) 40 MG capsule Take 40 mg by mouth every morning.     baclofen (LIORESAL) 10 MG tablet Take 10 mg by mouth 2 (two) times daily. 02/10/24: Reports during TOC call takes prn     Buprenorphine HCl (BELBUCA) 300 MCG FILM Place inside cheek.     busPIRone  (BUSPAR ) 30 MG tablet Take 30 mg by mouth 2 (two) times daily.     carvedilol  (COREG ) 25 MG tablet Take 1 tablet (25 mg total) by mouth 2 (two) times daily with a meal. 60 tablet 0   celecoxib  (CELEBREX ) 100 MG capsule TAKE 1 CAPSULE (100 MG TOTAL) BY MOUTH DAILY AS NEEDED FOR PAIN. 30 capsule 2   colchicine  0.6 MG tablet TAKE 1 TABLET BY MOUTH EVERY DAY 90 tablet 1   Continuous Glucose Sensor (DEXCOM G7 SENSOR) MISC 3 each by Does not apply route every 30 (thirty) days. Apply 1 sensor every 10 days 9 each 4   ergocalciferol  (VITAMIN D2) 1.25 MG (50000 UT) capsule Take 1 capsule (50,000 Units total) by mouth once a week. Monday 12 capsule 3   fexofenadine (ALLEGRA) 180 MG tablet Take 180 mg by mouth daily as needed for allergies or rhinitis.     folic acid (FOLVITE) 1 MG tablet Take 1 mg by mouth daily.     furosemide  (LASIX ) 40 MG tablet TAKE 1 TABLET BY MOUTH EACH MORNING 30 tablet 11    gabapentin  (NEURONTIN ) 300 MG capsule Take 300 mg by mouth 2 (two) times daily.     hydrochlorothiazide  (HYDRODIURIL ) 25 MG tablet Take 1 tablet (25 mg total) by mouth daily. 30 tablet 0   insulin  aspart (FIASP  FLEXTOUCH) 100 UNIT/ML FlexTouch Pen Inject 25-35 Units into the skin 3 (three) times daily before meals. 105 mL 3   insulin  degludec (TRESIBA  FLEXTOUCH) 200 UNIT/ML FlexTouch Pen Inject 50 Units into the skin 2 (two) times daily before a meal. 45 mL 3   insulin  glargine, 1 Unit Dial, (TOUJEO  SOLOSTAR) 300 UNIT/ML Solostar Pen Inject 25 Units into the skin 2 (two) times daily.     Insulin  Lispro-aabc (LYUMJEV  KWIKPEN) 200 UNIT/ML KwikPen Inject 25-35 Units into the skin 3 (three) times daily. 45 mL 3   Insulin  Pen Needle (EMBECTA PEN NEEDLE NANO 2 GEN) 32G X 4 MM MISC USE AS DIRECTED 5 TIMES A DAY 200 each 9   Insulin  Syringe-Needle U-100 26G X 1/2 1 ML MISC Use daily for insulin  injection as directed 100 each 3   Ipratropium-Albuterol  (COMBIVENT  RESPIMAT) 20-100 MCG/ACT AERS respimat Inhale 1 puff into the lungs every 6 (six) hours as needed for wheezing. 4 g  5   ipratropium-albuterol  (DUONEB) 0.5-2.5 (3) MG/3ML SOLN Take 3 mLs by nebulization every 6 (six) hours as needed (Wheezing or dyspnea.). 360 mL 11   levothyroxine  (SYNTHROID ) 175 MCG tablet TAKE 1 TABLET BY MOUTH ONCE DAILY BEFORE BREAKFAST 30 tablet 10   lidocaine  (LIDODERM ) 5 % Place 1 patch onto the skin daily.     metFORMIN  (GLUCOPHAGE ) 500 MG tablet TAKE 1 TABLET BY MOUTH EACH MORNING AND TAKE 1 TABLET EVERY DAY AT BEDTIME 180 tablet 3   naloxone  (NARCAN ) nasal spray 4 mg/0.1 mL One spray in either nostril once for known/suspected opioid overdose. May repeat every 2-3 minutes in alternating nostril til EMS arrives     ondansetron  (ZOFRAN ) 4 MG tablet Take 1 tablet (4 mg total) by mouth every 8 (eight) hours as needed for nausea or vomiting. 20 tablet 0   promethazine  (PHENERGAN ) 25 MG tablet TAKE 1 TABLET BY MOUTH EVERY 6  HOURS AS NEEDED FOR NAUSEA OR VOMITING. 60 tablet 1   Propylene Glycol (SYSTANE BALANCE) 0.6 % SOLN Place 1 drop into both eyes daily as needed (dry eyes).     Semaglutide , 2 MG/DOSE, (OZEMPIC , 2 MG/DOSE,) 8 MG/3ML SOPN Inject 2 mg into the skin once a week. 9 mL 3   simvastatin  (ZOCOR ) 20 MG tablet TAKE 1 TABLET BY MOUTH EVERY DAY AT BEDTIME 30 tablet 0   No current facility-administered medications on file prior to visit.    Review of Systems     Objective:  There were no vitals filed for this visit. BP Readings from Last 3 Encounters:  06/09/24 136/78  06/07/24 130/80  05/04/24 128/76   Wt Readings from Last 3 Encounters:  06/09/24 282 lb (127.9 kg)  06/07/24 274 lb 4 oz (124.4 kg)  05/04/24 282 lb (127.9 kg)   There is no height or weight on file to calculate BMI.    Physical Exam         Assessment & Plan:    See Problem List for Assessment and Plan of chronic medical problems.

## 2024-06-15 NOTE — Patient Instructions (Incomplete)
      Have an xray today downstairs of your toe.       Medications changes include :   continue Augmentin     A referral was ordered vascular and podiatry and someone will call you to schedule an appointment.

## 2024-06-15 NOTE — Telephone Encounter (Signed)
 FYI Only or Action Required?: referral request for podiatrist  Patient was last seen in primary care on 06/09/2024 by Geofm Glade PARAS, MD.  Called Nurse Triage reporting Cellulitis. On antibiotics  Symptoms began seen in office 06/09/2024.  Interventions attempted: Augmentin  875-125 mg twice daily x 10 days .  Symptoms are: gradually worsening.  Triage Disposition: See Physician Within 24 Hours  Patient/caregiver understands and will follow disposition?: Yes - Appt in office today.                     Copied from CRM #[id number]. Topic: Clinical - Red Word Triage >> Jun 15, 2024  8:47 AM Pinkey ORN wrote: Red Word that prompted transfer to Nurse Triage: Infection In Toe >> Jun 15, 2024  8:47 AM Pinkey ORN wrote: Patient states she has an infection in her toe (third toe,right foot) and is requesting to see a specialist. Patient states the antibiotics aren't helping as she expected. Patient also mentions that it's purple in color as well.  Reason for Disposition  [1] Taking antibiotic > 24 hours AND [2] cellulitis symptoms are WORSE (e.g., spreading redness, pain, swelling)  Answer Assessment - Initial Assessment Questions 1. SYMPTOM: What's the main symptom you're concerned about? (e.g., redness, swelling, pain, fever, weakness)     Reddish purple 2. CELLULITIS LOCATION: Where is the cellulitis located? (e.g., hand, arm, foot, leg, face)     Third toe right foot 3. CELLULITIS SIZE: What is the size of the red area? (e.g., inches, centimeters; compare to size of a coin).     Whole toe 4. BETTER-SAME-WORSE: Are you getting better, staying the same, or getting worse compared to the day you started the antibiotics?      A little worse 5. PAIN: Do you have any pain?  If Yes, ask: How bad is the pain?  (e.g., Scale 0-10; mild, moderate, or severe)     Unsure takes pain medications - 5-6/10 6. FEVER: Do you have a fever? If Yes, ask: What is it, how was it  measured and when did it start?     no 7. OTHER SYMPTOMS: Do you have any other symptoms? (e.g., pus coming from a wound, red streaks, weakness)     no 8. DIAGNOSIS DATE: When was the cellulitis diagnosed? By whom?      06/09/2024 9. ANTIBIOTIC NAME: What antibiotic(s) are you taking?  How many times per day? (Be sure the patient is taking the antibiotic as directed).      Augmentin  875-125 mg twice daily x 10 days 10. ANTIBIOTIC DATE: When was the antibiotic started?       10/15 11. FOLLOW-UP APPOINTMENT: Do you have a follow-up appointment with your doctor?       no  Protocols used: Cellulitis on Antibiotic Follow-up Call-[facility]

## 2024-06-16 ENCOUNTER — Ambulatory Visit: Payer: Self-pay | Admitting: Internal Medicine

## 2024-06-16 ENCOUNTER — Ambulatory Visit (INDEPENDENT_AMBULATORY_CARE_PROVIDER_SITE_OTHER): Admitting: Internal Medicine

## 2024-06-16 ENCOUNTER — Ambulatory Visit (INDEPENDENT_AMBULATORY_CARE_PROVIDER_SITE_OTHER)

## 2024-06-16 VITALS — BP 136/70 | HR 94 | Temp 98.4°F | Ht 69.0 in | Wt 279.0 lb

## 2024-06-16 DIAGNOSIS — L03031 Cellulitis of right toe: Secondary | ICD-10-CM

## 2024-06-16 DIAGNOSIS — I152 Hypertension secondary to endocrine disorders: Secondary | ICD-10-CM

## 2024-06-16 DIAGNOSIS — L97511 Non-pressure chronic ulcer of other part of right foot limited to breakdown of skin: Secondary | ICD-10-CM

## 2024-06-16 DIAGNOSIS — E1159 Type 2 diabetes mellitus with other circulatory complications: Secondary | ICD-10-CM | POA: Diagnosis not present

## 2024-06-16 DIAGNOSIS — L0390 Cellulitis, unspecified: Secondary | ICD-10-CM | POA: Diagnosis not present

## 2024-06-16 DIAGNOSIS — L97519 Non-pressure chronic ulcer of other part of right foot with unspecified severity: Secondary | ICD-10-CM | POA: Diagnosis not present

## 2024-06-16 DIAGNOSIS — M7989 Other specified soft tissue disorders: Secondary | ICD-10-CM | POA: Diagnosis not present

## 2024-06-16 DIAGNOSIS — M19071 Primary osteoarthritis, right ankle and foot: Secondary | ICD-10-CM | POA: Diagnosis not present

## 2024-06-16 NOTE — Assessment & Plan Note (Signed)
 Chronic Blood pressure borderline controlled No change in medications Continue amlodipine  10 mg daily, carvedilol  25 mg daily, HCTZ 25 mg daily

## 2024-06-16 NOTE — Assessment & Plan Note (Signed)
 Skin ulcer of right middle toe is a result of her debriding a callus Some redness, tenderness and swelling in the whole toe Being treated for cellulitis with Augmentin  875-125 mg twice daily x 10 days-has 3 days left Still having significant symptoms Concern for PAD/ischemia-referral to podiatry, vascular X-ray today just to make sure there is not any evidence of osteomyelitis-it is hard to know how long the ulcer has actually been there

## 2024-06-17 ENCOUNTER — Other Ambulatory Visit: Payer: Self-pay | Admitting: Internal Medicine

## 2024-06-17 ENCOUNTER — Ambulatory Visit (INDEPENDENT_AMBULATORY_CARE_PROVIDER_SITE_OTHER)
Admission: RE | Admit: 2024-06-17 | Discharge: 2024-06-17 | Disposition: A | Discharge status: Home / Self Care | Source: Ambulatory Visit | Attending: Internal Medicine | Admitting: Internal Medicine

## 2024-06-17 DIAGNOSIS — E2839 Other primary ovarian failure: Secondary | ICD-10-CM

## 2024-06-18 DIAGNOSIS — F332 Major depressive disorder, recurrent severe without psychotic features: Secondary | ICD-10-CM | POA: Diagnosis not present

## 2024-06-18 DIAGNOSIS — F1911 Other psychoactive substance abuse, in remission: Secondary | ICD-10-CM | POA: Diagnosis not present

## 2024-06-18 DIAGNOSIS — F418 Other specified anxiety disorders: Secondary | ICD-10-CM | POA: Diagnosis not present

## 2024-06-18 DIAGNOSIS — F4323 Adjustment disorder with mixed anxiety and depressed mood: Secondary | ICD-10-CM | POA: Diagnosis not present

## 2024-06-20 ENCOUNTER — Telehealth: Payer: Self-pay | Admitting: Internal Medicine

## 2024-06-20 DIAGNOSIS — M8580 Other specified disorders of bone density and structure, unspecified site: Secondary | ICD-10-CM | POA: Insufficient documentation

## 2024-06-20 DIAGNOSIS — G8929 Other chronic pain: Secondary | ICD-10-CM

## 2024-06-20 NOTE — Telephone Encounter (Signed)
 Please call her.  I have ordered an xray of her lumbar spine.  I would like her to come get it when she can.  There was an abnormality seen on her bone density but we need an xray to evaluate it further.

## 2024-06-21 NOTE — Telephone Encounter (Signed)
 Spoke with patient today.

## 2024-06-22 ENCOUNTER — Other Ambulatory Visit (HOSPITAL_COMMUNITY): Payer: Self-pay

## 2024-06-22 ENCOUNTER — Telehealth: Payer: Self-pay

## 2024-06-22 ENCOUNTER — Other Ambulatory Visit: Payer: Self-pay | Admitting: Cardiology

## 2024-06-22 NOTE — Telephone Encounter (Signed)
 Pharmacy Patient Advocate Encounter   Received notification from Onbase that prior authorization for Amitriptyline  HCl 50MG  tablets  is required/requested.   Insurance verification completed.   The patient is insured through [employer]   Per test claim: PA required; PA submitted to above mentioned insurance via Latent Key/confirmation #/EOC [facility] Status is pending

## 2024-06-22 NOTE — Telephone Encounter (Signed)
 Pharmacy Patient Advocate Encounter  Received notification from [facility] that Prior Authorization for  Amitriptyline  HCl 50MG  tablets  has been APPROVED from 06/22/24 to 08/25/24. Unable to obtain price due to refill too soon rejection, last fill date 06/22/24 next available fill date12/31/25   PA #/Case ID/Reference #: [id number]

## 2024-06-23 ENCOUNTER — Encounter: Payer: Self-pay | Admitting: Podiatry

## 2024-06-23 ENCOUNTER — Ambulatory Visit: Admitting: Podiatry

## 2024-06-23 ENCOUNTER — Ambulatory Visit

## 2024-06-23 VITALS — Ht 69.0 in | Wt 279.0 lb

## 2024-06-23 DIAGNOSIS — G8929 Other chronic pain: Secondary | ICD-10-CM | POA: Diagnosis not present

## 2024-06-23 DIAGNOSIS — M545 Low back pain, unspecified: Secondary | ICD-10-CM | POA: Diagnosis not present

## 2024-06-23 DIAGNOSIS — L97512 Non-pressure chronic ulcer of other part of right foot with fat layer exposed: Secondary | ICD-10-CM | POA: Diagnosis not present

## 2024-06-23 DIAGNOSIS — E08621 Diabetes mellitus due to underlying condition with foot ulcer: Secondary | ICD-10-CM | POA: Diagnosis not present

## 2024-06-23 DIAGNOSIS — I700 Atherosclerosis of aorta: Secondary | ICD-10-CM | POA: Diagnosis not present

## 2024-06-23 NOTE — Progress Notes (Signed)
 Chief Complaint  Patient presents with   Toe Pain    Pt is here due to to the right 3rd toe, seen her PCP on 10/22 for this issue and was recommended here, x-rays done, 3rd toe is sore, red and swollen, finished antibiotics that was prescribe.    HPI: 68 y.o. female PMHx diabetes mellitus; uncontrolled with peripheral polyneuropathy, last A1c 03/26/2024 was 8.3 presenting today for evaluation of an ulcer to the distal tip of the right third toe.  Ongoing for several weeks now.  She says that it began as a symptomatic callus that developed a wound underneath.  She has completed a course of oral Augmentin  prescribed by her PCP.  Referred for further treatment evaluation  Lab Results  Component Value Date   HGBA1C 8.3 (A) 03/26/2024   HGBA1C 11.5 (A) 11/19/2023   HGBA1C 13.0 (H) 05/27/2023     Past Medical History:  Diagnosis Date   AKI (acute kidney injury) 01/2017   Allergy    seasonal   Anxiety    with panic attacks   Arthritis    back; feet; hands; shoulders (08/26/2014)   Asthma    Cataract    forming   Cervical cancer (HCC)    Chronic lower back pain    Chronic narcotic use    Chronic pain syndrome    [facility]   Cirrhosis (HCC)    Clostridium difficile infection 2017   COPD (chronic obstructive pulmonary disease) (HCC)    Daily headache    past hx   Depression    Diabetic neuropathy (HCC) 06/04/2017   feet  and legs , some in hands   DJD (degenerative joint disease)    Fatty liver disease, nonalcoholic    Fibromyalgia    Frequency of urination    HCAP (healthcare-associated pneumonia) 08/26/2014   History of TIA (transient ischemic attack) 11/01/2010   NO RESIDUAL   Hyperlipidemia    Hypertension    Hypothyroidism    IDDM (insulin  dependent diabetes mellitus)    Insomnia    Lumbar stenosis    Macular degeneration    Memory difficulty 07/25/2016   Nocturia    OSA (obstructive sleep apnea)    NO CPAP SINCE WT LOSS   Osteoarthritis     with severe disease in knee   Oxygen  deficiency    4  liters at bedtime only   Pneumonia several times   Polymyalgia rheumatica    Scoliosis    Seasonal allergies    Stroke [facility])    TIA   Thyroid  cancer (HCC)    Urgency of urination    Vaginal pain S/P SLING  FEB 2012    Past Surgical History:  Procedure Laterality Date   APPENDECTOMY  1982   BREAST EXCISIONAL BIOPSY Left 02/28/2005   Atypical Ductal Hyperplasia   CARDIAC CATHETERIZATION  09/04/2004   NORMAL CORONARY ANATOMY/ NORMAL LVF/ EF 60%   CARDIOVASCULAR STRESS TEST  12-27-2010  DR JORDAN   ABNORMAL NUCLEAR STUDY W/ /MILD INFERIOR ISCHEMIA/ EF 69%/  CT HEART ANGIOGRAM ;  NO ACUTE FINDINGS   COLONOSCOPY     CRYOABLATION  05/16/2003   w/LEEP FOR ABNORMAL PAP SMEAR   CYSTOSCOPY  05/18/2012   Procedure: CYSTOSCOPY;  Surgeon: Glendia DELENA Elizabeth, MD;  Location: [facility] [place];  Service: Urology;  Laterality: N/A;  examination under anethesia   ESOPHAGOGASTRODUODENOSCOPY (EGD) WITH PROPOFOL  N/A 09/03/2016   Procedure: ESOPHAGOGASTRODUODENOSCOPY (EGD) WITH PROPOFOL ;  Surgeon: Victory LITTIE Brand III,  MD;  Location: [facility];  Service: Gastroenterology;  Laterality: N/A;   ESOPHAGOGASTRODUODENOSCOPY (EGD) WITH PROPOFOL  N/A 11/19/2021   Procedure: ESOPHAGOGASTRODUODENOSCOPY (EGD) WITH PROPOFOL ;  Surgeon: Legrand Victory LITTIE DOUGLAS, MD;  Location: [facility];  Service: Gastroenterology;  Laterality: N/A;  Varices screeing, cirrhosis   HYSTEROSCOPY WITH D & C  08/19/2007   PMB   KNEE ARTHROSCOPY W/ DEBRIDEMENT Left 03/29/2006   INTERNAL DERANGEMENT/ SEVERE DJD/ MENISCUS TEARS   LAPAROSCOPIC CHOLECYSTECTOMY  06/10/2002   LAPAROSCOPIC GASTRIC BANDING  03/01/2006   TRUNCAL VAGOTOMY/ PLACEMENT OF VG BAND   REVISION TOTAL KNEE ARTHROPLASTY Left 08-29-2008; 05/2009   TONSILLECTOMY  1969   TOTAL KNEE ARTHROPLASTY Left 01/23/2007   SEVERE DJD   TOTAL THYROIDECTOMY  11/22/2005   BILATERAL THYROID  NODULES-- PAPILLARY CARCINOMA  (0.5CM)/ ADENOMATOID NODULES   TRANSTHORACIC ECHOCARDIOGRAM  12/27/2010   LVSF NORMAL / EF 55-65%/ GRADE I DIASTOLIC DYSFUNCTION/ MILD MITRAL REGURG. / MILDLY DILATED LEFT ATRIUM/ MILDY INCREASED SYSTOLIC PRESSURE OF PULMONARY ARTERIES   TRANSVAGINAL SUBURETERAL TAPE/ SLING  09/28/2010   MIXED URINARY INCONTINENCE   TUBAL LIGATION  1983    Allergies  Allergen Reactions   Gabapentin  Swelling    Swelling in legs    Losartan Other (See Comments)    Myalgias and muscle cramping    Aricept  [Donepezil  Hcl]     Nausea/vomiting, low BP   Aripiprazole Other (See Comments)    Patient reports Hypoglycemia   Hydroxyzine      hallucinations   Oxycodone-Acetaminophen  Itching   Oxycodone Itching   Sulfa Antibiotics Nausea Only and Rash   Sulfonamide Derivatives Nausea Only and Rash    RT third toe 06/23/2024  Physical Exam: General: The patient is alert and oriented x3 in no acute distress.  Dermatology: Skin is warm, dry and supple bilateral lower extremities.  Ulcer noted to the distal tip of the right third toe measuring approximately 1.0 x 0.3 x 0.1 cm.  To the noted ulceration there is no eschar.  Minimal slough fibrin and necrotic tissue noted.  Granular wound base.  Good potential for healing.  There is no exposed bone muscle tendon ligament or joint.  Vascular: Palpable pedal pulses bilaterally. Capillary refill within normal limits.  No appreciable edema.  No erythema.  Clinically no concern for vascular compromise  Neurological: Grossly intact via light touch  Musculoskeletal Exam: Hammertoe contracture noted to the lesser digits bilateral contributing to the pressure on the distal tip of the right third toe creating the ulcer  DG Toe 3rd Right (Accession [id number]) (Order [id number]) Imaging Date: 06/16/2024 Department: [facility] Released By: Crawford Comer LABOR, RT Authorizing: Geofm Glade PARAS, MD  IMPRESSION: 1. No acute fracture or osteolysis  identified, Soft tissue swelling of the third digit. 2. Chronic severe degeneration of the second toe DIP.  Assessment/Plan of Care: 1.  Diabetic toe ulcer right third toe; fat layer exposed; initial encounter  -Patient evaluated.  X-rays reviewed that were taken on 06/16/2024 -Medically necessary excisional debridement including subcutaneous tissue was performed today using a tissue nipper.  Excisional debridement of the necrotic nonviable tissue down to healthier bleeding viable tissue was performed with postdebridement measurements same as pre- -Patient admits to walking around the house majority of the day barefoot on hardwood floors.  Advised against this.  I do believe that the patient is able to refrain from going barefoot and wear good supportive tennis shoes and sneakers even around the house the wound should resolve uneventfully -Recommend triple antibiotic and  a Band-Aid daily -Return to clinic 3 weeks       Thresa EMERSON Sar, DPM [facility]  Dr. Thresa EMERSON Sar, DPM    [address] [place], [place] [address]                Office [phone]  Fax [phone]

## 2024-06-24 DIAGNOSIS — M25561 Pain in right knee: Secondary | ICD-10-CM | POA: Diagnosis not present

## 2024-06-24 DIAGNOSIS — Z79891 Long term (current) use of opiate analgesic: Secondary | ICD-10-CM | POA: Diagnosis not present

## 2024-06-24 DIAGNOSIS — G894 Chronic pain syndrome: Secondary | ICD-10-CM | POA: Diagnosis not present

## 2024-06-24 DIAGNOSIS — Z794 Long term (current) use of insulin: Secondary | ICD-10-CM | POA: Diagnosis not present

## 2024-06-24 DIAGNOSIS — M533 Sacrococcygeal disorders, not elsewhere classified: Secondary | ICD-10-CM | POA: Diagnosis not present

## 2024-06-24 DIAGNOSIS — E1142 Type 2 diabetes mellitus with diabetic polyneuropathy: Secondary | ICD-10-CM | POA: Diagnosis not present

## 2024-06-24 DIAGNOSIS — Z0289 Encounter for other administrative examinations: Secondary | ICD-10-CM | POA: Diagnosis not present

## 2024-06-24 DIAGNOSIS — Z79899 Other long term (current) drug therapy: Secondary | ICD-10-CM | POA: Diagnosis not present

## 2024-06-24 DIAGNOSIS — Z7985 Long-term (current) use of injectable non-insulin antidiabetic drugs: Secondary | ICD-10-CM | POA: Diagnosis not present

## 2024-06-24 DIAGNOSIS — M25519 Pain in unspecified shoulder: Secondary | ICD-10-CM | POA: Diagnosis not present

## 2024-06-24 DIAGNOSIS — M25562 Pain in left knee: Secondary | ICD-10-CM | POA: Diagnosis not present

## 2024-06-27 ENCOUNTER — Ambulatory Visit: Payer: Self-pay | Admitting: Internal Medicine

## 2024-06-29 ENCOUNTER — Ambulatory Visit: Admitting: Internal Medicine

## 2024-06-29 ENCOUNTER — Encounter: Payer: Self-pay | Admitting: Internal Medicine

## 2024-06-29 VITALS — BP 136/70 | HR 99 | Ht 69.0 in | Wt 284.2 lb

## 2024-06-29 DIAGNOSIS — Z7985 Long-term (current) use of injectable non-insulin antidiabetic drugs: Secondary | ICD-10-CM

## 2024-06-29 DIAGNOSIS — E890 Postprocedural hypothyroidism: Secondary | ICD-10-CM | POA: Diagnosis not present

## 2024-06-29 DIAGNOSIS — Z794 Long term (current) use of insulin: Secondary | ICD-10-CM | POA: Diagnosis not present

## 2024-06-29 DIAGNOSIS — E782 Mixed hyperlipidemia: Secondary | ICD-10-CM

## 2024-06-29 DIAGNOSIS — Z7984 Long term (current) use of oral hypoglycemic drugs: Secondary | ICD-10-CM

## 2024-06-29 DIAGNOSIS — Z85850 Personal history of malignant neoplasm of thyroid: Secondary | ICD-10-CM

## 2024-06-29 DIAGNOSIS — E1159 Type 2 diabetes mellitus with other circulatory complications: Secondary | ICD-10-CM

## 2024-06-29 DIAGNOSIS — E1165 Type 2 diabetes mellitus with hyperglycemia: Secondary | ICD-10-CM | POA: Diagnosis not present

## 2024-06-29 LAB — POCT GLYCOSYLATED HEMOGLOBIN (HGB A1C): Hemoglobin A1C: 8.5 % — AB (ref 4.0–5.6)

## 2024-06-29 MED ORDER — TRESIBA FLEXTOUCH 200 UNIT/ML ~~LOC~~ SOPN
100.0000 [IU] | PEN_INJECTOR | Freq: Every day | SUBCUTANEOUS | Status: AC
Start: 2024-06-29 — End: ?

## 2024-06-29 NOTE — Progress Notes (Signed)
 Patient ID: Mary Acosta, female   DOB: Sep 07, 1955, 68 y.o.   MRN: [id number]   HPI: Mary Acosta is a 68 y.o.-year-old female, initially referred by her PCP, Dr. Geofm, returning for follow-up for DM2, dx in 2000, insulin -dependent since 2011, uncontrolled, with long-term complications (peripheral neuropathy, gastroparesis, cerebrovascular disease with history of TIA 2012, DKA 2015) and also history of thyroid  cancer and postsurgical hypothyroidism.  She previously saw my colleague, Dr. Kassie, up to 07/2019.  Last visit with me . months ago.  Interim hx: She denies nausea, abdominal pain, chest pain, shortness of breath.  She mentions she has insomnia, which is chronic for her.  DM2: Reviewed HbA1c levels: Lab Results  Component Value Date   HGBA1C 8.3 (A) 03/26/2024   HGBA1C 11.5 (A) 11/19/2023   HGBA1C 13.0 (H) 05/27/2023   HGBA1C 12.1 (A) 12/24/2022   HGBA1C 10.6 (A) 08/08/2022   HGBA1C 11.9 (A) 05/10/2022   HGBA1C 8.1 (A) 10/09/2021   HGBA1C 8.6 (A) 06/04/2021   HGBA1C 6.9 (A) 01/15/2021   HGBA1C 7.6 (A) 10/13/2020   She was previously on: - Basaglar  80 units at bedtime -Lilly Cares >> stopped >> once a week >> 50 >> 80 >> 160 units daily >> 80 units twice a day  - Metformin  1000 mg with dinner >> 500 mg 2x a day - Ozempic  0.5 >> 1 mg weekly - - Lyumjev  10-20 >> 20-25 units before meals >> muscle cramps >> decreased: 14 units Prev. On Rybelsus  3 mg before breakfast-see patient assistance pgm  She was on N and R insulin  in the past. She was on metformin  in the past >> did not work. She was prev. On Toujeo  180 units daily.  Previously on: - Metformin  500 mg 2x a day - Ozempic  1 mg weekly in a.m. >> stopped  2/2 high lipase >> restarted  - Tresiba  80 units daily  - started 1 week ago, prev. Basaglar  - U500 - 30 min before the meal: >> 50 units before B'fast Intermittently taking 40 units of Lyumjev  insulin   At last visit she was on: - Ozempic  1 mg weekly   - Toujeo /Tresiba  80 units daily - Lyumjev :  actually using 0-30 units based on an old sliding scale. You may need to take 10-15 units of Lyumjev  insulin  only for correction >> uses 10-20 units  At last visit I advised her to use the following regimen: - Metformin  500 mg 2x a day - /Toujeo  50 units x2 daily or 80 units at night - Lyumjev  >> FiAsp : 25 units before a smaller meal 30 units before a regular meal 35 units before a large meal You may need to take 10-20 units of Lyumjev  insulin  only for correction. - Ozempic  2 mg weekly  Pt checks her sugars 4x  times a day, prev. w daily/ CGM (with receiver):  Previously:  Prev.: - am: 400-550 >> 150-260 - 2h after b'fast: n/c - lunch: 250-400 >> 150-260 - 2h after lunch: n/c - dinner: 350 >> 260 - 2h after dinner: 450-500 >> n/c - bedtime: n/c  Previously:   Lowest sugar was 250 >> 40s >> 51 >> 68; she has hypoglycemia awareness in the 70s. Highest sugar was 561 >> Hi >> HI.  Glucometer: AccuChek, Prodigy  Pt's meals are: - Breakfast: bacon or sausage and eggs, cereal >> grits, or grilled cheese - Lunch: salad; hot dog, sandwiches >> ham and cheese sandwich or skips - Dinner: meat + veggies  >> larger -  Snacks: not so much anymore Previously drinking Kool-Aid, now stopped.  However, she is still having that she is drinking High C juice.   She is status post gastric banding with vagotomy in 2008.  She lost 148 pounds afterwards but then gained more than 100 pounds back.  No h/o pancreatitis or FH of MTC.  However, she had a high lipase in 05/2023.   No CKD, last BUN/creatinine:  Lab Results  Component Value Date   BUN 20 02/16/2024   BUN 18 02/08/2024   CREATININE 1.29 (H) 02/16/2024   CREATININE 0.87 02/08/2024   Lab Results  Component Value Date   MICRALBCREAT 5 03/26/2024   Not on ACE inhibitor/ARB.  + HL; last set of lipids: Lab Results  Component Value Date   CHOL 163 05/27/2023   HDL 32.30 (L)  05/27/2023   LDLCALC 88 05/27/2023   TRIG 178 (H) 02/03/2024   CHOLHDL 5 05/27/2023  On Zocor  20, Zetia  10.  - last eye exam was on 12/01/2023: no DR, hypertensive retinopathy, bilateral macular degeneration.   -She has pain, numbness, tingling in her feet.  Last foot exam 11/19/2023.  On ASA 81.  Pt has FH of DM in mother.  Thyroid  cancer:  Reviewed her thyroid  cancer history: She is status post total thyroidectomy - [place] Sx 2007. 11/22/2005: THYROID , THYROIDECTOMY:   - PAPILLARY THYROID  CARCINOMA (0.5 CM).   - ADENOMATOID NODULES.    COMMENT   ONCOLOGY TABLE-THYROID     1. Maximum tumor size (cm): 0.5 cm   2. Tumor location: Right mid lobe   3. Multifocality: Not identified   4. Histology: Papillary thyroid  carcinoma   5. Margins: Negative   6. Capsular invasion: Present   7. Extrathyroidal extension: Not identified   8. Vascular/Lymphatic invasion: Not identified   9. Lymph nodes: N/A   10. TNM code: pT1, pNX, pMX   11. Comments: Examination of the thyroidectomy specimen   grossly demonstrates multiple nodules. Histologic evaluation   demonstrates numerous adenomatoid nodules. One nodule measuring   0.5 cm demonstrates features compatible with papillary thyroid    carcinoma. The surgical margins are negative. (MS:jy) 11/25/05   Neck U/S (10/25/2020): no abnormal neck masses  Postsurgical hypothyroidism:  She takes 175 mcg levothyroxine  daily (dose increased 11/2022): - no missed doses anymore - in am - fasting - at least 30 min from b'fast - no calcium  - no iron - no multivitamins - no PPIs - not on Biotin  Reviewed her TFTs: Lab Results  Component Value Date   TSH 2.47 03/26/2024   TSH 0.111 (L) 02/03/2024   TSH 3.01 05/27/2023   TSH 10.66 (H) 12/24/2022   TSH 4.62 05/10/2022   TSH 0.13 (L) 10/09/2021   TSH 0.51 02/02/2021   TSH 0.14 (L) 08/04/2020   TSH 4.42 05/03/2020   TSH 1.50 10/22/2019   TSH 1.09 07/20/2019   TSH 1.32 04/01/2019    TSH 0.55 09/04/2018   TSH 0.90 12/26/2017   TSH 0.113 (L) 12/08/2017   Pt denies: - feeling nodules in neck - hoarseness - dysphagia - choking  She also has a history of COPD, OSA, fibromyalgia, depression, fatty liver-cirrhosis, DDD, h/o vb fx, PMR. She is seen in pain clinic in [facility].  She is on Neurontin  and amitriptyline . She lost 2 sons: 2020 and 09/2020.    ROS: + see HPI   I reviewed pt's medications, allergies, PMH, social hx, family hx, and changes were documented in the history of present illness. Otherwise,  unchanged from my initial visit note.   Past Medical History:  Diagnosis Date   AKI (acute kidney injury) 01/2017   Allergy    seasonal   Anxiety    with panic attacks   Arthritis    back; feet; hands; shoulders (08/26/2014)   Asthma    Cataract    forming   Cervical cancer (HCC)    Chronic lower back pain    Chronic narcotic use    Chronic pain syndrome    [facility]   Cirrhosis (HCC)    Clostridium difficile infection 2017   COPD (chronic obstructive pulmonary disease) (HCC)    Daily headache    past hx   Depression    Diabetic neuropathy (HCC) 06/04/2017   feet  and legs , some in hands   DJD (degenerative joint disease)    Fatty liver disease, nonalcoholic    Fibromyalgia    Frequency of urination    HCAP (healthcare-associated pneumonia) 08/26/2014   History of TIA (transient ischemic attack) 11/01/2010   NO RESIDUAL   Hyperlipidemia    Hypertension    Hypothyroidism    IDDM (insulin  dependent diabetes mellitus)    Insomnia    Lumbar stenosis    Macular degeneration    Memory difficulty 07/25/2016   Nocturia    OSA (obstructive sleep apnea)    NO CPAP SINCE WT LOSS   Osteoarthritis    with severe disease in knee   Oxygen  deficiency    4  liters at bedtime only   Pneumonia several times   Polymyalgia rheumatica    Scoliosis    Seasonal allergies    Stroke [facility])    TIA   Thyroid  cancer (HCC)    Urgency  of urination    Vaginal pain S/P SLING  FEB 2012    Past Surgical History:  Procedure Laterality Date   APPENDECTOMY  1982   BREAST EXCISIONAL BIOPSY Left 02/28/2005   Atypical Ductal Hyperplasia   CARDIAC CATHETERIZATION  09/04/2004   NORMAL CORONARY ANATOMY/ NORMAL LVF/ EF 60%   CARDIOVASCULAR STRESS TEST  12-27-2010  DR JORDAN   ABNORMAL NUCLEAR STUDY W/ /MILD INFERIOR ISCHEMIA/ EF 69%/  CT HEART ANGIOGRAM ;  NO ACUTE FINDINGS   COLONOSCOPY     CRYOABLATION  05/16/2003   w/LEEP FOR ABNORMAL PAP SMEAR   CYSTOSCOPY  05/18/2012   Procedure: CYSTOSCOPY;  Surgeon: Glendia DELENA Elizabeth, MD;  Location: [facility] [place];  Service: Urology;  Laterality: N/A;  examination under anethesia   ESOPHAGOGASTRODUODENOSCOPY (EGD) WITH PROPOFOL  N/A 09/03/2016   Procedure: ESOPHAGOGASTRODUODENOSCOPY (EGD) WITH PROPOFOL ;  Surgeon: Victory LITTIE Legrand DOUGLAS, MD;  Location: [facility];  Service: Gastroenterology;  Laterality: N/A;   ESOPHAGOGASTRODUODENOSCOPY (EGD) WITH PROPOFOL  N/A 11/19/2021   Procedure: ESOPHAGOGASTRODUODENOSCOPY (EGD) WITH PROPOFOL ;  Surgeon: Legrand Victory LITTIE DOUGLAS, MD;  Location: [facility];  Service: Gastroenterology;  Laterality: N/A;  Varices screeing, cirrhosis   HYSTEROSCOPY WITH D & C  08/19/2007   PMB   KNEE ARTHROSCOPY W/ DEBRIDEMENT Left 03/29/2006   INTERNAL DERANGEMENT/ SEVERE DJD/ MENISCUS TEARS   LAPAROSCOPIC CHOLECYSTECTOMY  06/10/2002   LAPAROSCOPIC GASTRIC BANDING  03/01/2006   TRUNCAL VAGOTOMY/ PLACEMENT OF VG BAND   REVISION TOTAL KNEE ARTHROPLASTY Left 08-29-2008; 05/2009   TONSILLECTOMY  1969   TOTAL KNEE ARTHROPLASTY Left 01/23/2007   SEVERE DJD   TOTAL THYROIDECTOMY  11/22/2005   BILATERAL THYROID  NODULES-- PAPILLARY CARCINOMA (0.5CM)/ ADENOMATOID NODULES   TRANSTHORACIC ECHOCARDIOGRAM  12/27/2010   LVSF  NORMAL / EF 55-65%/ GRADE I DIASTOLIC DYSFUNCTION/ MILD MITRAL REGURG. / MILDLY DILATED LEFT ATRIUM/ MILDY INCREASED SYSTOLIC PRESSURE OF PULMONARY ARTERIES    TRANSVAGINAL SUBURETERAL TAPE/ SLING  09/28/2010   MIXED URINARY INCONTINENCE   TUBAL LIGATION  1983    Social History   Socioeconomic History   Marital status: Married    Spouse name: Gaile   Number of children: 2   Years of education: 12   Highest education level: Not on file  Occupational History   Occupation: disabled    Associate Professor: UNEMPLOYED  Tobacco Use   Smoking status: Former    Current packs/day: 0.00    Average packs/day: 2.5 packs/day for 39.0 years (97.5 ttl pk-yrs)    Types: Cigarettes    Start date: 10/23/1971    Quit date: 10/22/2010    Years since quitting: 13.6   Smokeless tobacco: Never  Vaping Use   Vaping status: Never Used  Substance and Sexual Activity   Alcohol use: No    Alcohol/week: 0.0 standard drinks of alcohol   Drug use: No   Sexual activity: Not Currently  Other Topics Concern   Not on file  Social History Narrative   Lives at home w/ her husband    Right-handed   Caffeine: 1 cup of coffee per week + Pepsi      Lives with her husband and nephew and his wife.   Social Drivers of Corporate Investment Banker Strain: Low Risk  (12/23/2023)   Overall Financial Resource Strain (CARDIA)    Difficulty of Paying Living Expenses: Not very hard  Food Insecurity: No Food Insecurity (02/10/2024)   Hunger Vital Sign    Worried About Running Out of Food in the Last Year: Never true    Ran Out of Food in the Last Year: Never true  Transportation Needs: No Transportation Needs (02/10/2024)   PRAPARE - Administrator, Civil Service (Medical): No    Lack of Transportation (Non-Medical): No  Physical Activity: Inactive (12/23/2023)   Exercise Vital Sign    Days of Exercise per Week: 0 days    Minutes of Exercise per Session: 0 min  Stress: Stress Concern Present (12/23/2023)   [employer] of Occupational Health - Occupational Stress Questionnaire    Feeling of Stress : Very much  Social Connections: Moderately Isolated (12/23/2023)    Social Connection and Isolation Panel    Frequency of Communication with Friends and Family: More than three times a week    Frequency of Social Gatherings with Friends and Family: Twice a week    Attends Religious Services: Never    Database Administrator or Organizations: No    Attends Banker Meetings: Never    Marital Status: Married  Catering Manager Violence: Not At Risk (02/10/2024)   Humiliation, Afraid, Rape, and Kick questionnaire    Fear of Current or Ex-Partner: No    Emotionally Abused: No    Physically Abused: No    Sexually Abused: No    Current Outpatient Medications on File Prior to Visit  Medication Sig Dispense Refill   albuterol  (VENTOLIN  HFA) 108 (90 Base) MCG/ACT inhaler INHALE 2 PUFFS BY MOUTH EVERY 6 HOURS AS NEEDED FOR WHEEZING OR FOR SHORTNESS OF BREATH 18 g 11   amitriptyline  (ELAVIL ) 50 MG tablet Take 1 tablet (50 mg total) by mouth daily as needed for sleep. 30 tablet 5   atomoxetine (STRATTERA) 40 MG capsule Take 40 mg by mouth every morning.  baclofen (LIORESAL) 10 MG tablet Take 10 mg by mouth 2 (two) times daily. 02/10/24: Reports during TOC call takes prn     Buprenorphine HCl (BELBUCA) 300 MCG FILM Place inside cheek.     busPIRone  (BUSPAR ) 30 MG tablet Take 30 mg by mouth 2 (two) times daily.     carvedilol  (COREG ) 25 MG tablet Take 1 tablet (25 mg total) by mouth 2 (two) times daily with a meal. 60 tablet 0   celecoxib  (CELEBREX ) 100 MG capsule TAKE 1 CAPSULE (100 MG TOTAL) BY MOUTH DAILY AS NEEDED FOR PAIN. 30 capsule 2   colchicine  0.6 MG tablet TAKE 1 TABLET BY MOUTH EVERY DAY 90 tablet 1   Continuous Glucose Sensor (DEXCOM G7 SENSOR) MISC 3 each by Does not apply route every 30 (thirty) days. Apply 1 sensor every 10 days 9 each 4   ergocalciferol  (VITAMIN D2) 1.25 MG (50000 UT) capsule Take 1 capsule (50,000 Units total) by mouth once a week. Monday 12 capsule 3   fexofenadine (ALLEGRA) 180 MG tablet Take 180 mg by mouth daily as  needed for allergies or rhinitis.     folic acid (FOLVITE) 1 MG tablet Take 1 mg by mouth daily.     furosemide  (LASIX ) 40 MG tablet TAKE 1 TABLET BY MOUTH EACH MORNING 30 tablet 11   gabapentin  (NEURONTIN ) 300 MG capsule Take 300 mg by mouth 2 (two) times daily.     hydrochlorothiazide  (HYDRODIURIL ) 25 MG tablet Take 1 tablet (25 mg total) by mouth daily. 30 tablet 0   insulin  aspart (FIASP  FLEXTOUCH) 100 UNIT/ML FlexTouch Pen Inject 25-35 Units into the skin 3 (three) times daily before meals. 105 mL 3   insulin  degludec (TRESIBA  FLEXTOUCH) 200 UNIT/ML FlexTouch Pen Inject 50 Units into the skin 2 (two) times daily before a meal. 45 mL 3   insulin  glargine, 1 Unit Dial, (TOUJEO  SOLOSTAR) 300 UNIT/ML Solostar Pen Inject 25 Units into the skin 2 (two) times daily.     Insulin  Lispro-aabc (LYUMJEV  KWIKPEN) 200 UNIT/ML KwikPen Inject 25-35 Units into the skin 3 (three) times daily. 45 mL 3   Insulin  Pen Needle (EMBECTA PEN NEEDLE NANO 2 GEN) 32G X 4 MM MISC USE AS DIRECTED 5 TIMES A DAY 200 each 9   Insulin  Syringe-Needle U-100 26G X 1/2 1 ML MISC Use daily for insulin  injection as directed 100 each 3   Ipratropium-Albuterol  (COMBIVENT  RESPIMAT) 20-100 MCG/ACT AERS respimat Inhale 1 puff into the lungs every 6 (six) hours as needed for wheezing. 4 g 5   ipratropium-albuterol  (DUONEB) 0.5-2.5 (3) MG/3ML SOLN Take 3 mLs by nebulization every 6 (six) hours as needed (Wheezing or dyspnea.). 360 mL 11   levothyroxine  (SYNTHROID ) 175 MCG tablet TAKE 1 TABLET BY MOUTH ONCE DAILY BEFORE BREAKFAST 30 tablet 10   lidocaine  (LIDODERM ) 5 % Place 1 patch onto the skin daily.     metFORMIN  (GLUCOPHAGE ) 500 MG tablet TAKE 1 TABLET BY MOUTH EACH MORNING AND TAKE 1 TABLET EVERY DAY AT BEDTIME 180 tablet 3   naloxone  (NARCAN ) nasal spray 4 mg/0.1 mL One spray in either nostril once for known/suspected opioid overdose. May repeat every 2-3 minutes in alternating nostril til EMS arrives     ondansetron  (ZOFRAN ) 4 MG  tablet Take 1 tablet (4 mg total) by mouth every 8 (eight) hours as needed for nausea or vomiting. 20 tablet 0   PARoxetine  (PAXIL ) 40 MG tablet Take 40 mg by mouth daily.     promethazine  (PHENERGAN ) 25  MG tablet TAKE 1 TABLET BY MOUTH EVERY 6 HOURS AS NEEDED FOR NAUSEA OR VOMITING. 60 tablet 1   Propylene Glycol (SYSTANE BALANCE) 0.6 % SOLN Place 1 drop into both eyes daily as needed (dry eyes).     Semaglutide , 2 MG/DOSE, (OZEMPIC , 2 MG/DOSE,) 8 MG/3ML SOPN Inject 2 mg into the skin once a week. 9 mL 3   simvastatin  (ZOCOR ) 20 MG tablet TAKE 1 TABLET BY MOUTH ONCE DAILY AT BEDTIME *NEED APPOINTMENT FOR FURTHER REFILLS* 30 tablet 0   No current facility-administered medications on file prior to visit.    Allergies  Allergen Reactions   Gabapentin  Swelling    Swelling in legs    Losartan Other (See Comments)    Myalgias and muscle cramping    Aricept  [Donepezil  Hcl]     Nausea/vomiting, low BP   Aripiprazole Other (See Comments)    Patient reports Hypoglycemia   Hydroxyzine      hallucinations   Oxycodone-Acetaminophen  Itching   Oxycodone Itching   Sulfa Antibiotics Nausea Only and Rash   Sulfonamide Derivatives Nausea Only and Rash    Family History  Problem Relation Age of Onset   Colon polyps Mother    Diabetes Mother    Heart disease Mother    Dementia Mother    Heart disease Father    High blood pressure Father    Colon cancer Maternal Uncle        x 3   Stomach cancer Paternal Uncle    Breast cancer Other        great aunts x 5   Esophageal cancer Neg Hx    Rectal cancer Neg Hx    PE: BP 136/70   Pulse 99   Ht 5' 9 (1.753 m)   Wt 284 lb 3.2 oz (128.9 kg)   SpO2 93%   BMI 41.97 kg/m  Wt Readings from Last 15 Encounters:  06/29/24 284 lb 3.2 oz (128.9 kg)  06/23/24 279 lb (126.6 kg)  06/16/24 279 lb (126.6 kg)  06/09/24 282 lb (127.9 kg)  06/07/24 274 lb 4 oz (124.4 kg)  05/04/24 282 lb (127.9 kg)  03/26/24 292 lb 12.8 oz (132.8 kg)  02/16/24 292 lb  (132.5 kg)  02/08/24 296 lb 4.8 oz (134.4 kg)  01/13/24 300 lb (136.1 kg)  12/23/23 300 lb (136.1 kg)  12/04/23 300 lb (136.1 kg)  11/19/23 (!) 316 lb 3.2 oz (143.4 kg)  10/10/23 (!) 308 lb 4 oz (139.8 kg)  06/09/23 292 lb 9.6 oz (132.7 kg)   Constitutional: overweight, in NAD, walks with a walker Eyes:  EOMI, no exophthalmos ENT: no neck masses, no cervical lymphadenopathy Cardiovascular: Tachycardia, RR, No MRG Respiratory: CTA B Musculoskeletal: no deformities Skin: + rash on shins, + small ulcer on the plantar aspect of the R 3rd toe Neurological: no tremor with outstretched hands   ASSESSMENT: 1. DM2, insulin -dependent, uncontrolled, with complications - cerebrovascular disease with history of TIA 2012 - peripheral neuropathy - gastroparesis - DKA 2015   2.  History of thyroid  cancer   3.  Postsurgical hypothyroidism   PLAN:  1. Patient with longstanding, uncontrolled, type 2 diabetes, on metformin , basal/bolus insulin  regimen, and weekly GLP-1 receptor agonist, with improved control at last visit. -She was previously on U-500 insulin  but she felt that this was dropping her blood sugars too low and started to take Lyumjev .  At last visit, sugars were elevated throughout the day but upon questioning she was using much lower doses than recommended.  I advised her to increase these and also the Tresiba  and Ozempic  doses. I advised her to stop this immediately if she develops abdominal pain or increased nausea. CGM interpretation: -At today's visit, we reviewed her CGM downloads: It appears that 17% of values are in target range (goal >70%), while 83% are higher than 180 (goal <25%), and 0% are lower than 70 (goal <4%).  The calculated average blood sugar is 231.  The projected HbA1c for the next 3 months (GMI) is 8.8%. -Reviewing the CGM trends, sugars appear to fluctuate almost entirely above target.  She does mention that she is taking Fiasp  before each meal, between 20 and 35  units.  We discussed that it appears that her basal insulin  is not sufficient.  Upon questioning, she is taking this at bedtime, guiding the dose on the blood sugars at that time.  She sometimes takes 80 units daily, or sometimes 50 units twice a day.  She is waiting to run out of her Toujeo  and it appears that maybe the insulin  could be degraded or expired.  I advised her to try to switch to Tresiba , which she believes she has at home.  We will increase the dose to 100-120 units daily and I advised her to try to rotate her injection sites more, also using the upper thighs.  Will continue the rest of the regimen for now. - I suggested to:  Patient Instructions  Please continue: - Metformin  500 mg 2x a day - FiAsp : 25 units before a smaller meal 30 units before a regular meal 35 units before a large meal You may need to take 10-20 units ofFiAspinsulin only for correction. - Ozempic  2 mg weekly  Try to switch to Tresiba  and use: - 100-120 units  in the morning    Please continue Levothyroxine  175 mcg daily.   Take the thyroid  hormone every day, with water , at least 30 minutes before breakfast, separated by at least 4 hours from: - acid reflux medications - calcium  - iron - multivitamins   Please return in 3 months.     - we checked her HbA1c: 8.5% (higher) - advised to check sugars at different times of the day - 4x a day, rotating check times - advised for yearly eye exams >> she is UTD - return to clinic in 3 months   2.  History of thyroid  cancer -She had a papillary thyroid  cancer focus in the right thyroid  lobe, measuring 0.5 cm.  No vascular, lymphatic or direct extension into the surrounding tissues - No neck compression symptoms - Neck ultrasound from 10/2020 showed no suspicious masses - Will continue to follow her clinically for now   3.  Postsurgical hypothyroidism - latest thyroid  labs reviewed with pt. >> normal: Lab Results  Component Value Date   TSH 2.47  03/26/2024  - she continues on LT4 175 mcg daily - pt feels good on this dose. - we discussed about taking the thyroid  hormone every day, with water , >30 minutes before breakfast, separated by >4 hours from acid reflux medications, calcium , iron, multivitamins. Pt. is taking it correctly.  Lela Fendt, MD PhD [facility] Endocrinology

## 2024-06-29 NOTE — Patient Instructions (Addendum)
 Please continue: - Metformin  500 mg 2x a day - FiAsp : 25 units before a smaller meal 30 units before a regular meal 35 units before a large meal You may need to take 10-20 units ofFiAspinsulin only for correction. - Ozempic  2 mg weekly  Try to switch to Tresiba  and use: - 100-120 units  in the morning    Please continue Levothyroxine  175 mcg daily.   Take the thyroid  hormone every day, with water , at least 30 minutes before breakfast, separated by at least 4 hours from: - acid reflux medications - calcium  - iron - multivitamins   Please return in 3 months.

## 2024-07-09 ENCOUNTER — Other Ambulatory Visit: Payer: Self-pay | Admitting: Gastroenterology

## 2024-07-09 ENCOUNTER — Other Ambulatory Visit: Payer: Self-pay

## 2024-07-09 ENCOUNTER — Telehealth: Payer: Self-pay | Admitting: Gastroenterology

## 2024-07-09 NOTE — Telephone Encounter (Signed)
 Patient is requesting a refill for Zofran . Please advise. Thank you

## 2024-07-09 NOTE — Telephone Encounter (Signed)
Refill sent and pt informed

## 2024-07-12 ENCOUNTER — Ambulatory Visit: Payer: Self-pay

## 2024-07-12 NOTE — Telephone Encounter (Signed)
 FYI Only or Action Required?: FYI only for provider: appointment scheduled on 07/14/24.  Patient was last seen in primary care on 06/16/2024 by Geofm Glade PARAS, MD.  Called Nurse Triage reporting Toe Pain.  Symptoms began several weeks ago.  Interventions attempted: OTC medications: triple antibiotic ointment and band aid; completed prescription 10 day course of Augmentin .  Symptoms are: right foot 3rd toe pain with open wound and black/red discoloration improved since last visit ; left lower leg 1.5 inch long scratch with redness and black discoloration gradually worsening.  Triage Disposition: See PCP When Office is Open (Within 3 Days)  Patient/caregiver understands and will follow disposition?: Yes           Copied from CRM #[id number]. Topic: Clinical - Medication Question >> Jul 12, 2024 10:54 AM Terri G wrote: Reason for CRM: Patient is asking if Dr.Burns can call her in some antibiotics amoxicillin  Reason for Disposition  [1] Purple or black skin on foot or toe AND [2] unchanged since last exam  Answer Assessment - Initial Assessment Questions 1. ONSET: When did the pain start?      3-4 weeks.  2. LOCATION: Where is the pain located?   (e.g., around nail, entire toe, at foot joint)      3rd toe, right foot.  3. PAIN: How bad is the pain?    (Scale 1-10; or mild, moderate, severe)     Hurts me to walk on it and hurts to wear shoes  4. APPEARANCE: What does the toe look like? (e.g., redness, swelling, bruising, pallor)     She states there is still a black speck on the bottom of the toe, still an open wound. Denies any drainage. She states it looks red at the end of her toe, she states this is improved compared to previously it was the entire toe.  5. CAUSE: What do you think is causing the toe pain?     Diabetic foot ulcer.  6. OTHER SYMPTOMS: Do you have any other symptoms? (e.g., leg pain, rash, fever, numbness)     Left lateral side of leg  shallow scratch about 1.5 in long and she states she is concerned that it is infected (black colored and red around the area). She states she scratched the area a couple of weeks ago. Denies rash or fever.  Patient saw Dr Geofm 06/16/24 and followed up with podiatry on 06/23/24. She completed 10 day course of Augmentin  and states she thinks she needs more antibiotics. She states she has been applying triple antibiotic ointment and band aid. She states the podiatrist told her to wear shoes while in her home but she states she was not able to tolerate it. Patient states she is supposed to go back to the podiatrist on 07/14/24 but she can not afford to until she gets paid at the end of the month. Patient scheduled with PCP for 07/14/24.  Protocols used: Toe Pain-A-AH, Diabetes - Foot Problems and Questions-A-AH

## 2024-07-13 ENCOUNTER — Encounter: Payer: Self-pay | Admitting: Internal Medicine

## 2024-07-13 NOTE — Progress Notes (Unsigned)
 Subjective:    Patient ID: Mary Acosta, female    DOB: 03/13/1956, 68 y.o.   MRN: [id number]      HPI Mary Acosta is here for No chief complaint on file.        Medications and allergies reviewed with patient and updated if appropriate.  Current Outpatient Medications on File Prior to Visit  Medication Sig Dispense Refill   albuterol  (VENTOLIN  HFA) 108 (90 Base) MCG/ACT inhaler INHALE 2 PUFFS BY MOUTH EVERY 6 HOURS AS NEEDED FOR WHEEZING OR FOR SHORTNESS OF BREATH 18 g 11   amitriptyline  (ELAVIL ) 50 MG tablet Take 1 tablet (50 mg total) by mouth daily as needed for sleep. 30 tablet 5   atomoxetine (STRATTERA) 40 MG capsule Take 40 mg by mouth every morning.     baclofen (LIORESAL) 10 MG tablet Take 10 mg by mouth 2 (two) times daily. 02/10/24: Reports during TOC call takes prn     Buprenorphine HCl (BELBUCA) 300 MCG FILM Place inside cheek.     busPIRone  (BUSPAR ) 30 MG tablet Take 30 mg by mouth 2 (two) times daily.     carvedilol  (COREG ) 25 MG tablet Take 1 tablet (25 mg total) by mouth 2 (two) times daily with a meal. 60 tablet 0   celecoxib  (CELEBREX ) 100 MG capsule TAKE 1 CAPSULE (100 MG TOTAL) BY MOUTH DAILY AS NEEDED FOR PAIN. 30 capsule 2   colchicine  0.6 MG tablet TAKE 1 TABLET BY MOUTH EVERY DAY 90 tablet 1   Continuous Glucose Sensor (DEXCOM G7 SENSOR) MISC 3 each by Does not apply route every 30 (thirty) days. Apply 1 sensor every 10 days 9 each 4   ergocalciferol  (VITAMIN D2) 1.25 MG (50000 UT) capsule Take 1 capsule (50,000 Units total) by mouth once a week. Monday 12 capsule 3   fexofenadine (ALLEGRA) 180 MG tablet Take 180 mg by mouth daily as needed for allergies or rhinitis.     folic acid (FOLVITE) 1 MG tablet Take 1 mg by mouth daily.     furosemide  (LASIX ) 40 MG tablet TAKE 1 TABLET BY MOUTH EACH MORNING 30 tablet 11   gabapentin  (NEURONTIN ) 300 MG capsule Take 300 mg by mouth 2 (two) times daily.     hydrochlorothiazide  (HYDRODIURIL ) 25 MG tablet Take  1 tablet (25 mg total) by mouth daily. 30 tablet 0   insulin  aspart (FIASP  FLEXTOUCH) 100 UNIT/ML FlexTouch Pen Inject 25-35 Units into the skin 3 (three) times daily before meals. 105 mL 3   insulin  degludec (TRESIBA  FLEXTOUCH) 200 UNIT/ML FlexTouch Pen Inject 100-120 Units into the skin daily before breakfast.     Insulin  Pen Needle (EMBECTA PEN NEEDLE NANO 2 GEN) 32G X 4 MM MISC USE AS DIRECTED 5 TIMES A DAY 200 each 9   Insulin  Syringe-Needle U-100 26G X 1/2 1 ML MISC Use daily for insulin  injection as directed 100 each 3   Ipratropium-Albuterol  (COMBIVENT  RESPIMAT) 20-100 MCG/ACT AERS respimat Inhale 1 puff into the lungs every 6 (six) hours as needed for wheezing. 4 g 5   ipratropium-albuterol  (DUONEB) 0.5-2.5 (3) MG/3ML SOLN Take 3 mLs by nebulization every 6 (six) hours as needed (Wheezing or dyspnea.). 360 mL 11   levothyroxine  (SYNTHROID ) 175 MCG tablet TAKE 1 TABLET BY MOUTH ONCE DAILY BEFORE BREAKFAST 30 tablet 10   lidocaine  (LIDODERM ) 5 % Place 1 patch onto the skin daily.     metFORMIN  (GLUCOPHAGE ) 500 MG tablet TAKE 1 TABLET BY MOUTH EACH MORNING AND TAKE 1  TABLET EVERY DAY AT BEDTIME 180 tablet 3   naloxone  (NARCAN ) nasal spray 4 mg/0.1 mL One spray in either nostril once for known/suspected opioid overdose. May repeat every 2-3 minutes in alternating nostril til EMS arrives     ondansetron  (ZOFRAN ) 4 MG tablet TAKE 1 TABLET BY MOUTH EVERY 8 HOURS AS NEEDED FOR NAUSEA AND VOMITING 20 tablet 0   PARoxetine  (PAXIL ) 40 MG tablet Take 40 mg by mouth daily.     promethazine  (PHENERGAN ) 25 MG tablet TAKE 1 TABLET BY MOUTH EVERY 6 HOURS AS NEEDED FOR NAUSEA OR VOMITING. 60 tablet 1   Propylene Glycol (SYSTANE BALANCE) 0.6 % SOLN Place 1 drop into both eyes daily as needed (dry eyes).     Semaglutide , 2 MG/DOSE, (OZEMPIC , 2 MG/DOSE,) 8 MG/3ML SOPN Inject 2 mg into the skin once a week. 9 mL 3   simvastatin  (ZOCOR ) 20 MG tablet TAKE 1 TABLET BY MOUTH ONCE DAILY AT BEDTIME *NEED APPOINTMENT  FOR FURTHER REFILLS* 30 tablet 0   No current facility-administered medications on file prior to visit.    Review of Systems     Objective:  There were no vitals filed for this visit. BP Readings from Last 3 Encounters:  06/29/24 136/70  06/16/24 136/70  06/09/24 136/78   Wt Readings from Last 3 Encounters:  06/29/24 284 lb 3.2 oz (128.9 kg)  06/23/24 279 lb (126.6 kg)  06/16/24 279 lb (126.6 kg)   There is no height or weight on file to calculate BMI.    Physical Exam         Assessment & Plan:    See Problem List for Assessment and Plan of chronic medical problems.

## 2024-07-14 ENCOUNTER — Ambulatory Visit: Admitting: Podiatry

## 2024-07-14 ENCOUNTER — Ambulatory Visit: Admitting: Internal Medicine

## 2024-07-14 VITALS — BP 138/74 | HR 74 | Temp 99.2°F | Ht 69.0 in | Wt 280.0 lb

## 2024-07-14 DIAGNOSIS — E1159 Type 2 diabetes mellitus with other circulatory complications: Secondary | ICD-10-CM | POA: Diagnosis not present

## 2024-07-14 DIAGNOSIS — I152 Hypertension secondary to endocrine disorders: Secondary | ICD-10-CM | POA: Diagnosis not present

## 2024-07-14 DIAGNOSIS — T148XXA Other injury of unspecified body region, initial encounter: Secondary | ICD-10-CM | POA: Diagnosis not present

## 2024-07-14 DIAGNOSIS — S81812A Laceration without foreign body, left lower leg, initial encounter: Secondary | ICD-10-CM

## 2024-07-14 NOTE — Assessment & Plan Note (Signed)
 Acute Small skin tear left lower lateral leg from scratching herself No evidence of infection Granulation tissue appears healthy and ulcer Advised her to monitor closely Apply antibacterial ointment and keep it covered

## 2024-07-14 NOTE — Patient Instructions (Addendum)
        Medications changes include :   None

## 2024-07-14 NOTE — Assessment & Plan Note (Signed)
 Chronic Blood pressure borderline controlled No change in medications Continue amlodipine  10 mg daily, carvedilol  25 mg daily, HCTZ 25 mg daily

## 2024-07-14 NOTE — Assessment & Plan Note (Signed)
 New Right third toe plantar surface of distal toe she has a blood blister Ulcer has resolved and there is no open wound Area is slightly tender, but there is no erythema and no evidence of infection Reassured her this will resolve on its own-advised not to try to debride it-just leave alone

## 2024-07-21 ENCOUNTER — Other Ambulatory Visit: Payer: Self-pay | Admitting: Cardiology

## 2024-07-21 ENCOUNTER — Other Ambulatory Visit: Payer: Self-pay | Admitting: Internal Medicine

## 2024-07-23 DIAGNOSIS — F1911 Other psychoactive substance abuse, in remission: Secondary | ICD-10-CM | POA: Diagnosis not present

## 2024-07-23 DIAGNOSIS — F418 Other specified anxiety disorders: Secondary | ICD-10-CM | POA: Diagnosis not present

## 2024-07-23 DIAGNOSIS — F332 Major depressive disorder, recurrent severe without psychotic features: Secondary | ICD-10-CM | POA: Diagnosis not present

## 2024-07-23 DIAGNOSIS — F4323 Adjustment disorder with mixed anxiety and depressed mood: Secondary | ICD-10-CM | POA: Diagnosis not present

## 2024-08-11 DIAGNOSIS — J449 Chronic obstructive pulmonary disease, unspecified: Secondary | ICD-10-CM | POA: Diagnosis not present

## 2024-08-24 ENCOUNTER — Ambulatory Visit: Admitting: Podiatry

## 2024-09-03 ENCOUNTER — Telehealth: Payer: Self-pay

## 2024-09-03 ENCOUNTER — Other Ambulatory Visit: Payer: Self-pay | Admitting: Internal Medicine

## 2024-09-03 MED ORDER — AMOXICILLIN-POT CLAVULANATE 875-125 MG PO TABS: 1.0000 | ORAL_TABLET | Freq: Two times a day (BID) | ORAL | 0 refills | Status: AC

## 2024-09-03 NOTE — Telephone Encounter (Signed)
 I did send a prescription to the pharmacy.  If this does not help with the healing I would recommend that she follow-up with podiatry.

## 2024-09-03 NOTE — Telephone Encounter (Signed)
 Copied from CRM #[id number]. Topic: Clinical - Medication Question >> Sep 03, 2024  9:03 AM Mary Acosta wrote: Reason for CRM: Patient called in due to Ulcer on toe still stated that she cannot get it to heal would like the antibiotic that was previously prescribed refilled >> Sep 03, 2024  2:36 PM Mary Acosta wrote: Patient called back in to follow up on amoxicillin -clavulanate (AUGMENTIN ) 875-125 MG tablet refill.  [id number] [address], [place] - [address] AT [facility] [address]  [address] [place] [address]  Phone: [phone] Fax: [phone]   >> Sep 03, 2024  9:19 AM Mary Acosta wrote: Patient stated this was amoxicillin  and would just like another round called in.

## 2024-09-06 NOTE — Telephone Encounter (Signed)
 Spoke with patient today and recommendations given to her to follow up with podiatry if symptoms persist.

## 2024-09-09 ENCOUNTER — Other Ambulatory Visit: Payer: Self-pay | Admitting: Internal Medicine

## 2024-09-28 ENCOUNTER — Ambulatory Visit: Admitting: Internal Medicine

## 2024-10-14 ENCOUNTER — Ambulatory Visit: Admitting: Internal Medicine

## 2024-12-22 ENCOUNTER — Other Ambulatory Visit
